# Patient Record
Sex: Female | Born: 1945
Health system: Southern US, Community
[De-identification: ages and names within clinical notes are randomized; demographics above are authoritative.]

## PROBLEM LIST (undated history)

## (undated) DIAGNOSIS — M1990 Unspecified osteoarthritis, unspecified site: Secondary | ICD-10-CM

## (undated) DIAGNOSIS — F32A Depression, unspecified: Secondary | ICD-10-CM

## (undated) DIAGNOSIS — Z95820 Peripheral vascular angioplasty status with implants and grafts: Secondary | ICD-10-CM

## (undated) DIAGNOSIS — K2970 Gastritis, unspecified, without bleeding: Secondary | ICD-10-CM

## (undated) DIAGNOSIS — T7840XA Allergy, unspecified, initial encounter: Secondary | ICD-10-CM

## (undated) DIAGNOSIS — M940 Chondrocostal junction syndrome [Tietze]: Secondary | ICD-10-CM

## (undated) DIAGNOSIS — A0472 Enterocolitis due to Clostridium difficile, not specified as recurrent: Secondary | ICD-10-CM

## (undated) DIAGNOSIS — I10 Essential (primary) hypertension: Secondary | ICD-10-CM

## (undated) DIAGNOSIS — I219 Acute myocardial infarction, unspecified: Secondary | ICD-10-CM

## (undated) DIAGNOSIS — R0600 Dyspnea, unspecified: Secondary | ICD-10-CM

## (undated) DIAGNOSIS — M069 Rheumatoid arthritis, unspecified: Secondary | ICD-10-CM

## (undated) DIAGNOSIS — Z9289 Personal history of other medical treatment: Secondary | ICD-10-CM

## (undated) DIAGNOSIS — G629 Polyneuropathy, unspecified: Secondary | ICD-10-CM

## (undated) DIAGNOSIS — J069 Acute upper respiratory infection, unspecified: Secondary | ICD-10-CM

## (undated) DIAGNOSIS — G709 Myoneural disorder, unspecified: Secondary | ICD-10-CM

## (undated) DIAGNOSIS — K219 Gastro-esophageal reflux disease without esophagitis: Secondary | ICD-10-CM

## (undated) DIAGNOSIS — J449 Chronic obstructive pulmonary disease, unspecified: Secondary | ICD-10-CM

## (undated) DIAGNOSIS — Z8701 Personal history of pneumonia (recurrent): Secondary | ICD-10-CM

## (undated) DIAGNOSIS — N183 Chronic kidney disease, stage 3 (moderate): Secondary | ICD-10-CM

## (undated) DIAGNOSIS — R7303 Prediabetes: Secondary | ICD-10-CM

## (undated) DIAGNOSIS — I2510 Atherosclerotic heart disease of native coronary artery without angina pectoris: Secondary | ICD-10-CM

## (undated) DIAGNOSIS — T783XXA Angioneurotic edema, initial encounter: Secondary | ICD-10-CM

## (undated) DIAGNOSIS — E785 Hyperlipidemia, unspecified: Secondary | ICD-10-CM

## (undated) DIAGNOSIS — I951 Orthostatic hypotension: Secondary | ICD-10-CM

## (undated) DIAGNOSIS — K635 Polyp of colon: Secondary | ICD-10-CM

## (undated) DIAGNOSIS — J189 Pneumonia, unspecified organism: Secondary | ICD-10-CM

## (undated) DIAGNOSIS — F419 Anxiety disorder, unspecified: Secondary | ICD-10-CM

## (undated) DIAGNOSIS — E079 Disorder of thyroid, unspecified: Secondary | ICD-10-CM

## (undated) DIAGNOSIS — E119 Type 2 diabetes mellitus without complications: Secondary | ICD-10-CM

## (undated) DIAGNOSIS — C801 Malignant (primary) neoplasm, unspecified: Secondary | ICD-10-CM

## (undated) HISTORY — PX: OTHER SURGICAL HISTORY: SHX169

## (undated) HISTORY — DX: Acute myocardial infarction, unspecified: I21.9

## (undated) HISTORY — DX: Depression, unspecified: F32.A

## (undated) HISTORY — DX: Polyp of colon: K63.5

## (undated) HISTORY — PX: COLONOSCOPY W/ BIOPSIES AND POLYPECTOMY: SHX1376

## (undated) HISTORY — DX: Gastro-esophageal reflux disease without esophagitis: K21.9

## (undated) HISTORY — DX: Gastritis, unspecified, without bleeding: K29.70

## (undated) HISTORY — DX: Hyperlipidemia, unspecified: E78.5

## (undated) HISTORY — PX: UPPER GASTROINTESTINAL ENDOSCOPY: SHX188

## (undated) HISTORY — DX: Enterocolitis due to Clostridium difficile, not specified as recurrent: A04.72

## (undated) HISTORY — PX: ESOPHAGOGASTRODUODENOSCOPY: SHX1529

## (undated) HISTORY — DX: Essential (primary) hypertension: I10

## (undated) HISTORY — DX: Atherosclerotic heart disease of native coronary artery without angina pectoris: I25.10

## (undated) HISTORY — PX: TOTAL KNEE ARTHROPLASTY: SHX125

## (undated) HISTORY — PX: CARPAL TUNNEL RELEASE: SHX101

## (undated) HISTORY — PX: BACK SURGERY: SHX140

## (undated) HISTORY — DX: Angioneurotic edema, initial encounter: T78.3XXA

## (undated) HISTORY — DX: Personal history of other medical treatment: Z92.89

## (undated) HISTORY — PX: COLONOSCOPY: SHX174

## (undated) HISTORY — DX: Unspecified osteoarthritis, unspecified site: M19.90

## (undated) HISTORY — DX: Allergy, unspecified, initial encounter: T78.40XA

## (undated) HISTORY — DX: Chronic kidney disease, stage 3 (moderate): N18.3

## (undated) HISTORY — DX: Polyneuropathy, unspecified: G62.9

## (undated) HISTORY — DX: Disorder of thyroid, unspecified: E07.9

## (undated) HISTORY — DX: Chondrocostal junction syndrome (tietze): M94.0

## (undated) HISTORY — DX: Type 2 diabetes mellitus without complications: E11.9

## (undated) HISTORY — DX: Personal history of pneumonia (recurrent): Z87.01

## (undated) HISTORY — DX: Acute upper respiratory infection, unspecified: J06.9

---

## 1976-08-02 HISTORY — PX: ABDOMINAL HYSTERECTOMY: SHX81

## 1999-08-12 ENCOUNTER — Emergency Department (HOSPITAL_COMMUNITY): Admission: EM | Admit: 1999-08-12 | Discharge: 1999-08-12 | Payer: Self-pay | Admitting: Emergency Medicine

## 1999-11-07 ENCOUNTER — Emergency Department (HOSPITAL_COMMUNITY): Admission: EM | Admit: 1999-11-07 | Discharge: 1999-11-07 | Payer: Self-pay | Admitting: Emergency Medicine

## 1999-11-08 ENCOUNTER — Encounter: Payer: Self-pay | Admitting: Emergency Medicine

## 2000-05-16 ENCOUNTER — Ambulatory Visit (HOSPITAL_BASED_OUTPATIENT_CLINIC_OR_DEPARTMENT_OTHER): Admission: RE | Admit: 2000-05-16 | Discharge: 2000-05-16 | Payer: Self-pay | Admitting: Orthopedic Surgery

## 2001-05-26 ENCOUNTER — Encounter: Admission: RE | Admit: 2001-05-26 | Discharge: 2001-05-26 | Payer: Self-pay | Admitting: Orthopedic Surgery

## 2001-05-26 ENCOUNTER — Encounter: Payer: Self-pay | Admitting: Orthopedic Surgery

## 2001-06-09 ENCOUNTER — Encounter: Admission: RE | Admit: 2001-06-09 | Discharge: 2001-06-09 | Payer: Self-pay | Admitting: Orthopedic Surgery

## 2001-06-09 ENCOUNTER — Encounter: Payer: Self-pay | Admitting: Orthopedic Surgery

## 2001-06-23 ENCOUNTER — Encounter: Payer: Self-pay | Admitting: Orthopedic Surgery

## 2001-06-23 ENCOUNTER — Encounter: Admission: RE | Admit: 2001-06-23 | Discharge: 2001-06-23 | Payer: Self-pay | Admitting: Orthopedic Surgery

## 2001-07-24 ENCOUNTER — Other Ambulatory Visit: Admission: RE | Admit: 2001-07-24 | Discharge: 2001-07-24 | Payer: Self-pay | Admitting: *Deleted

## 2002-01-15 ENCOUNTER — Encounter: Admission: RE | Admit: 2002-01-15 | Discharge: 2002-04-15 | Payer: Self-pay | Admitting: *Deleted

## 2002-07-31 ENCOUNTER — Other Ambulatory Visit: Admission: RE | Admit: 2002-07-31 | Discharge: 2002-07-31 | Payer: Self-pay | Admitting: Obstetrics & Gynecology

## 2002-11-25 ENCOUNTER — Emergency Department (HOSPITAL_COMMUNITY): Admission: EM | Admit: 2002-11-25 | Discharge: 2002-11-25 | Payer: Self-pay | Admitting: Emergency Medicine

## 2003-06-10 ENCOUNTER — Inpatient Hospital Stay (HOSPITAL_COMMUNITY): Admission: RE | Admit: 2003-06-10 | Discharge: 2003-06-12 | Payer: Self-pay | Admitting: Orthopedic Surgery

## 2003-08-10 ENCOUNTER — Emergency Department (HOSPITAL_COMMUNITY): Admission: AD | Admit: 2003-08-10 | Discharge: 2003-08-10 | Payer: Self-pay | Admitting: Family Medicine

## 2003-08-30 ENCOUNTER — Ambulatory Visit (HOSPITAL_COMMUNITY): Admission: RE | Admit: 2003-08-30 | Discharge: 2003-08-30 | Payer: Self-pay | Admitting: Gastroenterology

## 2003-10-23 ENCOUNTER — Other Ambulatory Visit: Admission: RE | Admit: 2003-10-23 | Discharge: 2003-10-23 | Payer: Self-pay | Admitting: Obstetrics & Gynecology

## 2004-06-03 ENCOUNTER — Emergency Department (HOSPITAL_COMMUNITY): Admission: EM | Admit: 2004-06-03 | Discharge: 2004-06-03 | Payer: Self-pay | Admitting: Emergency Medicine

## 2004-07-15 ENCOUNTER — Ambulatory Visit: Payer: Self-pay | Admitting: Family Medicine

## 2004-07-30 ENCOUNTER — Ambulatory Visit: Payer: Self-pay | Admitting: Family Medicine

## 2004-08-18 ENCOUNTER — Encounter: Admission: RE | Admit: 2004-08-18 | Discharge: 2004-11-16 | Payer: Self-pay | Admitting: Family Medicine

## 2004-08-31 ENCOUNTER — Ambulatory Visit: Payer: Self-pay | Admitting: Family Medicine

## 2004-11-27 ENCOUNTER — Ambulatory Visit: Payer: Self-pay | Admitting: Family Medicine

## 2004-12-19 ENCOUNTER — Encounter: Admission: RE | Admit: 2004-12-19 | Discharge: 2004-12-19 | Payer: Self-pay | Admitting: Neurology

## 2005-01-06 ENCOUNTER — Encounter: Admission: RE | Admit: 2005-01-06 | Discharge: 2005-01-06 | Payer: Self-pay | Admitting: Neurology

## 2005-01-21 ENCOUNTER — Encounter: Admission: RE | Admit: 2005-01-21 | Discharge: 2005-01-21 | Payer: Self-pay | Admitting: Neurology

## 2005-03-25 ENCOUNTER — Ambulatory Visit: Payer: Self-pay | Admitting: Family Medicine

## 2005-04-19 ENCOUNTER — Ambulatory Visit (HOSPITAL_COMMUNITY): Admission: RE | Admit: 2005-04-19 | Discharge: 2005-04-20 | Payer: Self-pay | Admitting: Neurosurgery

## 2005-08-06 ENCOUNTER — Ambulatory Visit: Payer: Self-pay | Admitting: Family Medicine

## 2005-09-17 ENCOUNTER — Ambulatory Visit: Payer: Self-pay | Admitting: Internal Medicine

## 2005-10-05 ENCOUNTER — Ambulatory Visit: Payer: Self-pay | Admitting: Family Medicine

## 2005-10-12 ENCOUNTER — Ambulatory Visit: Payer: Self-pay | Admitting: Family Medicine

## 2005-12-06 ENCOUNTER — Ambulatory Visit: Payer: Self-pay | Admitting: Family Medicine

## 2005-12-13 ENCOUNTER — Ambulatory Visit: Payer: Self-pay | Admitting: Family Medicine

## 2006-01-05 ENCOUNTER — Ambulatory Visit (HOSPITAL_BASED_OUTPATIENT_CLINIC_OR_DEPARTMENT_OTHER): Admission: RE | Admit: 2006-01-05 | Discharge: 2006-01-05 | Payer: Self-pay | Admitting: Orthopedic Surgery

## 2006-02-25 ENCOUNTER — Ambulatory Visit: Payer: Self-pay | Admitting: Family Medicine

## 2006-07-04 ENCOUNTER — Ambulatory Visit: Payer: Self-pay | Admitting: Family Medicine

## 2006-07-07 ENCOUNTER — Ambulatory Visit: Payer: Self-pay | Admitting: Family Medicine

## 2006-08-02 DIAGNOSIS — K635 Polyp of colon: Secondary | ICD-10-CM

## 2006-08-02 HISTORY — DX: Polyp of colon: K63.5

## 2006-08-19 ENCOUNTER — Ambulatory Visit: Payer: Self-pay | Admitting: Family Medicine

## 2006-09-04 ENCOUNTER — Encounter: Admission: RE | Admit: 2006-09-04 | Discharge: 2006-09-04 | Payer: Self-pay | Admitting: Neurosurgery

## 2006-09-27 ENCOUNTER — Ambulatory Visit (HOSPITAL_COMMUNITY): Admission: RE | Admit: 2006-09-27 | Discharge: 2006-09-28 | Payer: Self-pay | Admitting: Neurosurgery

## 2006-11-21 ENCOUNTER — Ambulatory Visit: Payer: Self-pay | Admitting: Family Medicine

## 2006-11-21 LAB — CONVERTED CEMR LAB
ALT: 19 units/L (ref 0–40)
AST: 27 units/L (ref 0–37)
Albumin: 3.8 g/dL (ref 3.5–5.2)
Alkaline Phosphatase: 68 units/L (ref 39–117)
BUN: 19 mg/dL (ref 6–23)
Basophils Absolute: 0.1 10*3/uL (ref 0.0–0.1)
Basophils Relative: 0.9 % (ref 0.0–1.0)
Bilirubin, Direct: 0.1 mg/dL (ref 0.0–0.3)
CO2: 33 meq/L — ABNORMAL HIGH (ref 19–32)
Calcium: 9.8 mg/dL (ref 8.4–10.5)
Chloride: 105 meq/L (ref 96–112)
Cholesterol: 244 mg/dL (ref 0–200)
Creatinine, Ser: 0.8 mg/dL (ref 0.4–1.2)
Direct LDL: 151.5 mg/dL
Eosinophils Absolute: 0.4 10*3/uL (ref 0.0–0.6)
Eosinophils Relative: 6.6 % — ABNORMAL HIGH (ref 0.0–5.0)
GFR calc Af Amer: 94 mL/min
GFR calc non Af Amer: 78 mL/min
Glucose, Bld: 103 mg/dL — ABNORMAL HIGH (ref 70–99)
HCT: 39.6 % (ref 36.0–46.0)
HDL: 60.8 mg/dL (ref >39.0)
Hemoglobin: 13.5 g/dL (ref 12.0–15.0)
Lymphocytes Relative: 53.1 % — ABNORMAL HIGH (ref 12.0–46.0)
MCHC: 34 g/dL (ref 30.0–36.0)
MCV: 90.2 fL (ref 78.0–100.0)
Monocytes Absolute: 0.5 10*3/uL (ref 0.2–0.7)
Monocytes Relative: 8.3 % (ref 3.0–11.0)
Neutro Abs: 1.9 10*3/uL (ref 1.4–7.7)
Neutrophils Relative %: 31.1 % — ABNORMAL LOW (ref 43.0–77.0)
Platelets: 243 10*3/uL (ref 150–400)
Potassium: 3.6 meq/L (ref 3.5–5.1)
RBC: 4.39 M/uL (ref 3.87–5.11)
RDW: 13.9 % (ref 11.5–14.6)
Sodium: 145 meq/L (ref 135–145)
TSH: 2.01 microintl units/mL (ref 0.35–5.50)
Total Bilirubin: 0.7 mg/dL (ref 0.3–1.2)
Total CHOL/HDL Ratio: 4
Total Protein: 7.9 g/dL (ref 6.0–8.3)
Triglycerides: 111 mg/dL (ref 0–149)
VLDL: 22 mg/dL (ref 0–40)
WBC: 6.1 10*3/uL (ref 4.5–10.5)

## 2006-11-28 ENCOUNTER — Ambulatory Visit: Payer: Self-pay | Admitting: Family Medicine

## 2007-01-19 DIAGNOSIS — M1990 Unspecified osteoarthritis, unspecified site: Secondary | ICD-10-CM | POA: Insufficient documentation

## 2007-01-19 DIAGNOSIS — K219 Gastro-esophageal reflux disease without esophagitis: Secondary | ICD-10-CM | POA: Insufficient documentation

## 2007-01-19 DIAGNOSIS — I119 Hypertensive heart disease without heart failure: Secondary | ICD-10-CM | POA: Insufficient documentation

## 2007-01-19 DIAGNOSIS — J309 Allergic rhinitis, unspecified: Secondary | ICD-10-CM | POA: Insufficient documentation

## 2007-01-19 DIAGNOSIS — I10 Essential (primary) hypertension: Secondary | ICD-10-CM | POA: Insufficient documentation

## 2007-01-19 DIAGNOSIS — E039 Hypothyroidism, unspecified: Secondary | ICD-10-CM | POA: Insufficient documentation

## 2007-02-27 ENCOUNTER — Ambulatory Visit: Payer: Self-pay | Admitting: Family Medicine

## 2007-02-27 DIAGNOSIS — N6089 Other benign mammary dysplasias of unspecified breast: Secondary | ICD-10-CM | POA: Insufficient documentation

## 2007-02-27 DIAGNOSIS — M069 Rheumatoid arthritis, unspecified: Secondary | ICD-10-CM | POA: Insufficient documentation

## 2007-05-04 ENCOUNTER — Telehealth (INDEPENDENT_AMBULATORY_CARE_PROVIDER_SITE_OTHER): Payer: Self-pay | Admitting: *Deleted

## 2007-05-07 DIAGNOSIS — M5430 Sciatica, unspecified side: Secondary | ICD-10-CM | POA: Insufficient documentation

## 2007-05-08 ENCOUNTER — Ambulatory Visit: Payer: Self-pay | Admitting: Family Medicine

## 2007-06-08 ENCOUNTER — Ambulatory Visit: Payer: Self-pay | Admitting: Gastroenterology

## 2007-06-13 ENCOUNTER — Ambulatory Visit: Payer: Self-pay | Admitting: Family Medicine

## 2007-06-20 ENCOUNTER — Encounter: Payer: Self-pay | Admitting: Gastroenterology

## 2007-06-20 ENCOUNTER — Encounter: Payer: Self-pay | Admitting: Family Medicine

## 2007-06-20 ENCOUNTER — Ambulatory Visit: Payer: Self-pay | Admitting: Gastroenterology

## 2007-06-20 LAB — HM COLONOSCOPY

## 2007-08-21 ENCOUNTER — Encounter: Admission: RE | Admit: 2007-08-21 | Discharge: 2007-08-21 | Payer: Self-pay | Admitting: Orthopedic Surgery

## 2007-09-02 ENCOUNTER — Ambulatory Visit: Payer: Self-pay | Admitting: Family Medicine

## 2007-09-13 ENCOUNTER — Inpatient Hospital Stay (HOSPITAL_COMMUNITY): Admission: RE | Admit: 2007-09-13 | Discharge: 2007-09-17 | Payer: Self-pay | Admitting: Orthopedic Surgery

## 2007-10-12 ENCOUNTER — Ambulatory Visit: Payer: Self-pay | Admitting: Family Medicine

## 2007-10-12 DIAGNOSIS — K222 Esophageal obstruction: Secondary | ICD-10-CM | POA: Insufficient documentation

## 2007-10-16 ENCOUNTER — Ambulatory Visit: Payer: Self-pay | Admitting: Gastroenterology

## 2007-10-16 LAB — CONVERTED CEMR LAB
Basophils Absolute: 0 10*3/uL (ref 0.0–0.1)
Basophils Relative: 0.5 % (ref 0.0–1.0)
Eosinophils Absolute: 0.1 10*3/uL (ref 0.0–0.6)
Eosinophils Relative: 2 % (ref 0.0–5.0)
HCT: 39.4 % (ref 36.0–46.0)
Hemoglobin: 13.5 g/dL (ref 12.0–15.0)
Lymphocytes Relative: 35.7 % (ref 12.0–46.0)
MCHC: 34.3 g/dL (ref 30.0–36.0)
MCV: 88.8 fL (ref 78.0–100.0)
Monocytes Absolute: 0.5 10*3/uL (ref 0.2–0.7)
Monocytes Relative: 7.6 % (ref 3.0–11.0)
Neutro Abs: 3.8 10*3/uL (ref 1.4–7.7)
Neutrophils Relative %: 54.2 % (ref 43.0–77.0)
Platelets: 225 10*3/uL (ref 150–400)
RBC: 4.43 M/uL (ref 3.87–5.11)
RDW: 14.1 % (ref 11.5–14.6)
WBC: 6.9 10*3/uL (ref 4.5–10.5)

## 2007-10-18 ENCOUNTER — Ambulatory Visit: Payer: Self-pay | Admitting: Gastroenterology

## 2007-10-26 ENCOUNTER — Ambulatory Visit: Payer: Self-pay | Admitting: Gastroenterology

## 2007-10-26 ENCOUNTER — Encounter: Payer: Self-pay | Admitting: Family Medicine

## 2007-10-26 ENCOUNTER — Encounter: Payer: Self-pay | Admitting: Gastroenterology

## 2007-11-13 ENCOUNTER — Ambulatory Visit: Payer: Self-pay | Admitting: Family Medicine

## 2007-11-13 LAB — CONVERTED CEMR LAB
ALT: 15 units/L (ref 0–35)
AST: 20 units/L (ref 0–37)
Albumin: 3.7 g/dL (ref 3.5–5.2)
Alkaline Phosphatase: 68 units/L (ref 39–117)
BUN: 10 mg/dL (ref 6–23)
Basophils Absolute: 0.1 10*3/uL (ref 0.0–0.1)
Basophils Relative: 1 % (ref 0.0–1.0)
Bilirubin Urine: NEGATIVE
Bilirubin, Direct: 0.1 mg/dL (ref 0.0–0.3)
Blood in Urine, dipstick: NEGATIVE
CO2: 35 meq/L — ABNORMAL HIGH (ref 19–32)
Calcium: 10.1 mg/dL (ref 8.4–10.5)
Chloride: 102 meq/L (ref 96–112)
Cholesterol: 202 mg/dL (ref 0–200)
Creatinine, Ser: 0.8 mg/dL (ref 0.4–1.2)
Direct LDL: 128 mg/dL
Eosinophils Absolute: 0.3 10*3/uL (ref 0.0–0.7)
Eosinophils Relative: 4.1 % (ref 0.0–5.0)
GFR calc Af Amer: 93 mL/min
GFR calc non Af Amer: 77 mL/min
Glucose, Bld: 88 mg/dL (ref 70–99)
Glucose, Urine, Semiquant: NEGATIVE
HCT: 39.7 % (ref 36.0–46.0)
HDL: 52.3 mg/dL (ref >39.0)
Hemoglobin: 13 g/dL (ref 12.0–15.0)
Ketones, urine, test strip: NEGATIVE
Lymphocytes Relative: 50.6 % — ABNORMAL HIGH (ref 12.0–46.0)
MCHC: 32.7 g/dL (ref 30.0–36.0)
MCV: 89.6 fL (ref 78.0–100.0)
Monocytes Absolute: 0.5 10*3/uL (ref 0.1–1.0)
Monocytes Relative: 7.5 % (ref 3.0–12.0)
Neutro Abs: 2.4 10*3/uL (ref 1.4–7.7)
Neutrophils Relative %: 36.8 % — ABNORMAL LOW (ref 43.0–77.0)
Nitrite: NEGATIVE
Platelets: 257 10*3/uL (ref 150–400)
Potassium: 3.5 meq/L (ref 3.5–5.1)
RBC: 4.44 M/uL (ref 3.87–5.11)
RDW: 13.5 % (ref 11.5–14.6)
Sodium: 145 meq/L (ref 135–145)
Specific Gravity, Urine: >=1.03
TSH: 2.1 microintl units/mL (ref 0.35–5.50)
Total Bilirubin: 0.6 mg/dL (ref 0.3–1.2)
Total CHOL/HDL Ratio: 3.9
Total Protein: 7.7 g/dL (ref 6.0–8.3)
Triglycerides: 96 mg/dL (ref 0–149)
Urobilinogen, UA: 0.2
VLDL: 19 mg/dL (ref 0–40)
WBC Urine, dipstick: NEGATIVE
WBC: 6.5 10*3/uL (ref 4.5–10.5)
pH: 5.5

## 2007-11-16 ENCOUNTER — Ambulatory Visit: Payer: Self-pay | Admitting: Family Medicine

## 2007-11-16 DIAGNOSIS — E785 Hyperlipidemia, unspecified: Secondary | ICD-10-CM

## 2007-11-16 DIAGNOSIS — E1169 Type 2 diabetes mellitus with other specified complication: Secondary | ICD-10-CM | POA: Insufficient documentation

## 2007-11-23 ENCOUNTER — Ambulatory Visit: Payer: Self-pay | Admitting: Gastroenterology

## 2007-12-04 ENCOUNTER — Ambulatory Visit (HOSPITAL_COMMUNITY): Admission: RE | Admit: 2007-12-04 | Discharge: 2007-12-04 | Payer: Self-pay | Admitting: Gastroenterology

## 2007-12-29 ENCOUNTER — Telehealth: Payer: Self-pay | Admitting: *Deleted

## 2008-01-04 ENCOUNTER — Encounter: Payer: Self-pay | Admitting: Family Medicine

## 2008-03-22 ENCOUNTER — Ambulatory Visit: Payer: Self-pay | Admitting: Family Medicine

## 2008-05-02 ENCOUNTER — Telehealth: Payer: Self-pay | Admitting: Family Medicine

## 2008-05-09 ENCOUNTER — Ambulatory Visit: Payer: Self-pay | Admitting: Family Medicine

## 2008-08-02 HISTORY — PX: NEPHRECTOMY: SHX65

## 2008-09-17 ENCOUNTER — Ambulatory Visit: Payer: Self-pay | Admitting: Family Medicine

## 2008-10-24 ENCOUNTER — Ambulatory Visit: Payer: Self-pay | Admitting: Family Medicine

## 2008-11-14 ENCOUNTER — Ambulatory Visit: Payer: Self-pay | Admitting: Gastroenterology

## 2008-11-14 ENCOUNTER — Ambulatory Visit: Payer: Self-pay | Admitting: Family Medicine

## 2008-11-14 DIAGNOSIS — Z860100 Personal history of colon polyps, unspecified: Secondary | ICD-10-CM | POA: Insufficient documentation

## 2008-11-14 LAB — CONVERTED CEMR LAB
ALT: 18 units/L (ref 0–35)
AST: 27 units/L (ref 0–37)
Albumin: 3.7 g/dL (ref 3.5–5.2)
Alkaline Phosphatase: 61 units/L (ref 39–117)
BUN: 10 mg/dL (ref 6–23)
Basophils Absolute: 0 10*3/uL (ref 0.0–0.1)
Basophils Relative: 0 % (ref 0.0–3.0)
Bilirubin Urine: NEGATIVE
Blood in Urine, dipstick: NEGATIVE
CO2: 30 meq/L (ref 19–32)
Calcium: 9.8 mg/dL (ref 8.4–10.5)
Chloride: 102 meq/L (ref 96–112)
Creatinine, Ser: 0.7 mg/dL (ref 0.4–1.2)
Eosinophils Absolute: 0.3 10*3/uL (ref 0.0–0.7)
Eosinophils Relative: 3.7 % (ref 0.0–5.0)
GFR calc non Af Amer: 108.41 mL/min (ref >60)
Glucose, Bld: 81 mg/dL (ref 70–99)
Glucose, Urine, Semiquant: NEGATIVE
HCT: 38.7 % (ref 36.0–46.0)
Hemoglobin: 13.8 g/dL (ref 12.0–15.0)
Ketones, urine, test strip: NEGATIVE
Lipase: 21 units/L (ref 11.0–59.0)
Lymphocytes Relative: 49.2 % — ABNORMAL HIGH (ref 12.0–46.0)
Lymphs Abs: 3.4 10*3/uL (ref 0.7–4.0)
MCHC: 35.8 g/dL (ref 30.0–36.0)
MCV: 89.9 fL (ref 78.0–100.0)
Monocytes Absolute: 0.9 10*3/uL (ref 0.1–1.0)
Monocytes Relative: 13.5 % — ABNORMAL HIGH (ref 3.0–12.0)
Neutro Abs: 2.3 10*3/uL (ref 1.4–7.7)
Neutrophils Relative %: 33.6 % — ABNORMAL LOW (ref 43.0–77.0)
Nitrite: NEGATIVE
Platelets: 211 10*3/uL (ref 150.0–400.0)
Potassium: 4 meq/L (ref 3.5–5.1)
Protein, U semiquant: NEGATIVE
RBC: 4.3 M/uL (ref 3.87–5.11)
RDW: 13.5 % (ref 11.5–14.6)
Sodium: 140 meq/L (ref 135–145)
Specific Gravity, Urine: <1.005
Total Bilirubin: 0.7 mg/dL (ref 0.3–1.2)
Total Protein: 8.1 g/dL (ref 6.0–8.3)
Urobilinogen, UA: 0.2
WBC Urine, dipstick: NEGATIVE
WBC: 6.9 10*3/uL (ref 4.5–10.5)
pH: 7.5

## 2008-11-15 ENCOUNTER — Ambulatory Visit: Payer: Self-pay | Admitting: Internal Medicine

## 2008-11-18 ENCOUNTER — Ambulatory Visit: Payer: Self-pay | Admitting: Family Medicine

## 2008-11-18 LAB — CONVERTED CEMR LAB
Bilirubin Urine: NEGATIVE
Blood in Urine, dipstick: NEGATIVE
Glucose, Urine, Semiquant: NEGATIVE
Ketones, urine, test strip: NEGATIVE
Nitrite: NEGATIVE
Protein, U semiquant: NEGATIVE
Specific Gravity, Urine: 1.02
Urobilinogen, UA: 0.2
WBC Urine, dipstick: NEGATIVE
pH: 6.5

## 2008-11-19 LAB — CONVERTED CEMR LAB
Cholesterol: 192 mg/dL (ref 0–200)
HDL: 55.3 mg/dL (ref >39.00)
LDL Cholesterol: 122 mg/dL — ABNORMAL HIGH (ref 0–99)
TSH: 0.79 microintl units/mL (ref 0.35–5.50)
Total CHOL/HDL Ratio: 3
Triglycerides: 74 mg/dL (ref 0.0–149.0)
VLDL: 14.8 mg/dL (ref 0.0–40.0)

## 2008-12-10 ENCOUNTER — Ambulatory Visit (HOSPITAL_COMMUNITY): Admission: RE | Admit: 2008-12-10 | Discharge: 2008-12-10 | Payer: Self-pay | Admitting: Urology

## 2008-12-10 ENCOUNTER — Encounter (INDEPENDENT_AMBULATORY_CARE_PROVIDER_SITE_OTHER): Payer: Self-pay | Admitting: Interventional Radiology

## 2009-01-09 ENCOUNTER — Encounter: Payer: Self-pay | Admitting: Family Medicine

## 2009-01-14 DIAGNOSIS — Z905 Acquired absence of kidney: Secondary | ICD-10-CM | POA: Insufficient documentation

## 2009-01-17 ENCOUNTER — Inpatient Hospital Stay (HOSPITAL_COMMUNITY): Admission: RE | Admit: 2009-01-17 | Discharge: 2009-01-20 | Payer: Self-pay | Admitting: Urology

## 2009-01-17 ENCOUNTER — Encounter (INDEPENDENT_AMBULATORY_CARE_PROVIDER_SITE_OTHER): Payer: Self-pay | Admitting: Urology

## 2009-04-10 ENCOUNTER — Ambulatory Visit: Payer: Self-pay | Admitting: Family Medicine

## 2009-06-16 ENCOUNTER — Ambulatory Visit (HOSPITAL_BASED_OUTPATIENT_CLINIC_OR_DEPARTMENT_OTHER): Admission: RE | Admit: 2009-06-16 | Discharge: 2009-06-16 | Payer: Self-pay | Admitting: Orthopedic Surgery

## 2009-08-19 ENCOUNTER — Ambulatory Visit: Payer: Self-pay | Admitting: Family Medicine

## 2009-10-31 LAB — HM DIABETES EYE EXAM: HM Diabetic Eye Exam: NORMAL

## 2009-11-27 ENCOUNTER — Ambulatory Visit: Payer: Self-pay | Admitting: Family Medicine

## 2009-11-27 LAB — CONVERTED CEMR LAB
ALT: 29 units/L (ref 0–35)
AST: 33 units/L (ref 0–37)
Albumin: 3.9 g/dL (ref 3.5–5.2)
Alkaline Phosphatase: 62 units/L (ref 39–117)
BUN: 20 mg/dL (ref 6–23)
Basophils Absolute: 0.1 10*3/uL (ref 0.0–0.1)
Basophils Relative: 0.8 % (ref 0.0–3.0)
Bilirubin, Direct: 0.1 mg/dL (ref 0.0–0.3)
CO2: 29 meq/L (ref 19–32)
Calcium: 9.7 mg/dL (ref 8.4–10.5)
Chloride: 102 meq/L (ref 96–112)
Cholesterol: 194 mg/dL (ref 0–200)
Creatinine, Ser: 1.1 mg/dL (ref 0.4–1.2)
Eosinophils Absolute: 0.4 10*3/uL (ref 0.0–0.7)
Eosinophils Relative: 6.7 % — ABNORMAL HIGH (ref 0.0–5.0)
GFR calc non Af Amer: 63.94 mL/min (ref >60)
Glucose, Bld: 110 mg/dL — ABNORMAL HIGH (ref 70–99)
HCT: 42 % (ref 36.0–46.0)
HDL: 54.8 mg/dL (ref >39.00)
Hemoglobin: 14.4 g/dL (ref 12.0–15.0)
LDL Cholesterol: 108 mg/dL — ABNORMAL HIGH (ref 0–99)
Lymphocytes Relative: 41.6 % (ref 12.0–46.0)
Lymphs Abs: 2.8 10*3/uL (ref 0.7–4.0)
MCHC: 34.2 g/dL (ref 30.0–36.0)
MCV: 88.9 fL (ref 78.0–100.0)
Monocytes Absolute: 0.6 10*3/uL (ref 0.1–1.0)
Monocytes Relative: 8.5 % (ref 3.0–12.0)
Neutro Abs: 2.8 10*3/uL (ref 1.4–7.7)
Neutrophils Relative %: 42.4 % — ABNORMAL LOW (ref 43.0–77.0)
Platelets: 223 10*3/uL (ref 150.0–400.0)
Potassium: 3 meq/L — ABNORMAL LOW (ref 3.5–5.1)
RBC: 4.73 M/uL (ref 3.87–5.11)
RDW: 14.8 % — ABNORMAL HIGH (ref 11.5–14.6)
Sodium: 140 meq/L (ref 135–145)
TSH: 2.37 microintl units/mL (ref 0.35–5.50)
Total Bilirubin: 0.6 mg/dL (ref 0.3–1.2)
Total CHOL/HDL Ratio: 4
Total Protein: 8.4 g/dL — ABNORMAL HIGH (ref 6.0–8.3)
Triglycerides: 154 mg/dL — ABNORMAL HIGH (ref 0.0–149.0)
VLDL: 30.8 mg/dL (ref 0.0–40.0)
WBC: 6.6 10*3/uL (ref 4.5–10.5)

## 2009-12-25 ENCOUNTER — Ambulatory Visit: Payer: Self-pay | Admitting: Family Medicine

## 2009-12-26 ENCOUNTER — Telehealth: Payer: Self-pay | Admitting: Family Medicine

## 2009-12-26 LAB — CONVERTED CEMR LAB
BUN: 17 mg/dL (ref 6–23)
Basophils Absolute: 0 10*3/uL (ref 0.0–0.1)
Basophils Relative: 0.6 % (ref 0.0–3.0)
CO2: 30 meq/L (ref 19–32)
Calcium: 9.7 mg/dL (ref 8.4–10.5)
Chloride: 100 meq/L (ref 96–112)
Creatinine, Ser: 1.2 mg/dL (ref 0.4–1.2)
Eosinophils Absolute: 0.4 10*3/uL (ref 0.0–0.7)
Eosinophils Relative: 5.6 % — ABNORMAL HIGH (ref 0.0–5.0)
GFR calc non Af Amer: 60.73 mL/min (ref >60)
Glucose, Bld: 181 mg/dL — ABNORMAL HIGH (ref 70–99)
HCT: 41.6 % (ref 36.0–46.0)
Hemoglobin: 14.2 g/dL (ref 12.0–15.0)
Lymphocytes Relative: 44.8 % (ref 12.0–46.0)
Lymphs Abs: 2.9 10*3/uL (ref 0.7–4.0)
MCHC: 34.2 g/dL (ref 30.0–36.0)
MCV: 90.4 fL (ref 78.0–100.0)
Monocytes Absolute: 0.5 10*3/uL (ref 0.1–1.0)
Monocytes Relative: 7.4 % (ref 3.0–12.0)
Neutro Abs: 2.7 10*3/uL (ref 1.4–7.7)
Neutrophils Relative %: 41.6 % — ABNORMAL LOW (ref 43.0–77.0)
Platelets: 222 10*3/uL (ref 150.0–400.0)
Potassium: 3.5 meq/L (ref 3.5–5.1)
RBC: 4.6 M/uL (ref 3.87–5.11)
RDW: 14.8 % — ABNORMAL HIGH (ref 11.5–14.6)
Sodium: 140 meq/L (ref 135–145)
Uric Acid, Serum: 7.4 mg/dL — ABNORMAL HIGH (ref 2.4–7.0)
WBC: 6.5 10*3/uL (ref 4.5–10.5)

## 2010-01-22 ENCOUNTER — Ambulatory Visit: Payer: Self-pay | Admitting: Family Medicine

## 2010-01-22 DIAGNOSIS — M109 Gout, unspecified: Secondary | ICD-10-CM | POA: Insufficient documentation

## 2010-02-05 ENCOUNTER — Ambulatory Visit: Payer: Self-pay | Admitting: Family Medicine

## 2010-02-23 ENCOUNTER — Ambulatory Visit (HOSPITAL_COMMUNITY): Admission: RE | Admit: 2010-02-23 | Discharge: 2010-02-23 | Payer: Self-pay | Admitting: Urology

## 2010-03-05 ENCOUNTER — Ambulatory Visit: Payer: Self-pay | Admitting: Family Medicine

## 2010-03-05 DIAGNOSIS — E1129 Type 2 diabetes mellitus with other diabetic kidney complication: Secondary | ICD-10-CM | POA: Insufficient documentation

## 2010-04-27 LAB — HM MAMMOGRAPHY: HM Mammogram: NORMAL

## 2010-04-28 ENCOUNTER — Encounter: Payer: Self-pay | Admitting: Family Medicine

## 2010-06-03 ENCOUNTER — Ambulatory Visit: Payer: Self-pay | Admitting: Family Medicine

## 2010-06-04 LAB — CONVERTED CEMR LAB
BUN: 19 mg/dL (ref 6–23)
CO2: 28 meq/L (ref 19–32)
Calcium: 9.6 mg/dL (ref 8.4–10.5)
Chloride: 104 meq/L (ref 96–112)
Creatinine, Ser: 1 mg/dL (ref 0.4–1.2)
GFR calc non Af Amer: 71.26 mL/min (ref >60)
Glucose, Bld: 97 mg/dL (ref 70–99)
Hgb A1c MFr Bld: 6.3 % (ref 4.6–6.5)
Potassium: 3.1 meq/L — ABNORMAL LOW (ref 3.5–5.1)
Sodium: 141 meq/L (ref 135–145)

## 2010-06-15 ENCOUNTER — Ambulatory Visit: Payer: Self-pay | Admitting: Family Medicine

## 2010-07-21 ENCOUNTER — Ambulatory Visit: Payer: Self-pay | Admitting: Family Medicine

## 2010-09-02 LAB — HM DIABETES EYE EXAM: HM Diabetic Eye Exam: NORMAL

## 2010-09-03 NOTE — Assessment & Plan Note (Signed)
Summary: cpx/mhf   Vital Signs:  Patient Profile:   65 Years Old Female Height:     66 inches (167.64 cm) Weight:      212 pounds Temp:     98.5 degrees F oral Pulse rate:   74 / minute BP sitting:   122 / 80  (left arm)  Vitals Entered By: Doristine Devoid (November 16, 2007 10:41 AM)                 Chief Complaint:  cpx.  History of Present Illness: [name] is a 65 year old female, who comes in today for evaluation of high blood pressure, reflux, esophagitis, allergic rhinitis, osteoarthritis, hypothyroidism, hyperlipidemia, and a new problem, right shoulder pain.  She takes Tenoretic 50 -- 25 one daily for hypertension and her blood pressure has been under good control.  She said no side effects from medication.  she's currently taking protonic 40 mg a day, given to her by Dr. Arlyce Dice.  She said her biopsy was negative.  Her symptoms are starting to abate and she is due to see Dr. Arlyce Dice for follow-up soon.  She takes over-the-counter medication for allergic rhinitis.  She has rheumatoid arthritis.  It just got done seen her rheumatologist however, now she is having severe right shoulder pain.  Her rheumatologist had her on diclofenac 75 mg two tabs b.i.d., but is not helping.  Her hypothyroidism is treated with Synthroid 50 micrograms daily and her TSH level is 2.10 therefore, we will continue that dose. lipids are goal.  We will therefore continue that medication.  In February.  She had a redo of a total knee on the right.  She's doing better nowThe hyperlipidemia, treated with Zocor 20 mg nightly    Current Allergies: ! ASA CODEINE  Past Medical History:    Reviewed history from 10/12/2007 and no changes required:       Allergic rhinitis       GERD       Hypertension       Hypothyroidism       Osteoarthritis       TA       COSTOCHONDRITIS       TKR 09       childbirth x 3       TAH       redo total knee, right, February 2009   Family History:    Reviewed  history from 02/27/2007 and no changes required:       her mother had rheumatoid arthritis.       Family History of Arthritis  Social History:    Reviewed history from 10/12/2007 and no changes required:       Single       Never Smoked       Alcohol use-no       Drug use-no       Regular exercise-yes    Review of Systems      See HPI   Physical Exam  General:     Well-developed,well-nourished,in no acute distress; alert,appropriate and cooperative throughout examination Head:     Normocephalic and atraumatic without obvious abnormalities. No apparent alopecia or balding. Eyes:     No corneal or conjunctival inflammation noted. EOMI. Perrla. Funduscopic exam benign, without hemorrhages, exudates or papilledema. Vision grossly normal. Ears:     External ear exam shows no significant lesions or deformities.  Otoscopic examination reveals clear canals, tympanic membranes are intact bilaterally without bulging, retraction, inflammation or discharge. Hearing is  grossly normal bilaterally. Nose:     External nasal examination shows no deformity or inflammation. Nasal mucosa are pink and moist without lesions or exudates. Mouth:     Oral mucosa and oropharynx without lesions or exudates.  Teeth in good repair. Neck:     No deformities, masses, or tenderness noted. Chest Wall:     No deformities, masses, or tenderness noted. Lungs:     Normal respiratory effort, chest expands symmetrically. Lungs are clear to auscultation, no crackles or wheezes. Heart:     Normal rate and regular rhythm. S1 and S2 normal without gallop, murmur, click, rub or other extra sounds. Abdomen:     Bowel sounds positive,abdomen soft and non-tender without masses, organomegaly or hernias noted. Msk:     No deformity or scoliosis noted of thoracic or lumbar spine.   Pulses:     R and L carotid,radial,femoral,dorsalis pedis and posterior tibial pulses are full and equal bilaterally Extremities:     No  clubbing, cyanosis, edema, or deformity noted with normal full range of motion of all joints.   Neurologic:     No cranial nerve deficits noted. Station and gait are normal. Plantar reflexes are down-going bilaterally. DTRs are symmetrical throughout. Sensory, motor and coordinative functions appear intact. Skin:     well-healed recent scar, right knee from redo total knee replacement February 2009 Cervical Nodes:     No lymphadenopathy noted Axillary Nodes:     No palpable lymphadenopathy Inguinal Nodes:     No significant adenopathy Psych:     Cognition and judgment appear intact. Alert and cooperative with normal attention span and concentration. No apparent delusions, illusions, hallucinations    Impression & Recommendations:  Problem # 1:  HYPOTHYROIDISM (ICD-244.9) Assessment: Improved  Her updated medication list for this problem includes:    Synthroid 50 Mcg Tabs (Levothyroxine sodium) .[name] Kitchen... Take 1 tablet by mouth once a day   Problem # 2:  HYPERTENSION (ICD-401.9) Assessment: Improved  Her updated medication list for this problem includes:    Tenoretic 50 50-25 Mg Tabs (Atenolol-chlorthalidone) .[name] Kitchen... Take one tablet daily   Problem # 3:  ARTHRITIS, RHEUMATOID (ICD-714.0) Assessment: Unchanged  Her updated medication list for this problem includes:    Diclofenac Sodium 75 Mg Tbec (Diclofenac sodium) .[name] Kitchen... 2 tabs bid    Prednisone 20 Mg Tabs (Prednisone) .[name] Kitchen... As needed   Problem # 4:  HYPERLIPIDEMIA (ICD-272.4) Assessment: Unchanged  Her updated medication list for this problem includes:    Zocor 20 Mg Tabs (Simvastatin) .[name] Kitchen... 1 qhs   Problem # 5:  GERD (ICD-530.81) Assessment: Improved  Her updated medication list for this problem includes:    Protonix 40 Mg Tbec (Pantoprazole sodium) .[name] Kitchen... Take 1 tablet by mouth every morning   Complete Medication List: 1)  Klor-con M20 20 Meq Tbcr (Potassium chloride crys cr) .... Take 1 capsule by mouth once a day  2)  Synthroid 50 Mcg Tabs (Levothyroxine sodium) .... Take 1 tablet by mouth once a day 3)  Diclofenac Sodium 75 Mg Tbec (Diclofenac sodium) .... 2 tabs bid 4)  Zocor 20 Mg Tabs (Simvastatin) .[name] Kitchen.. 1 qhs 5)  Tenoretic 50 50-25 Mg Tabs (Atenolol-chlorthalidone) .... Take one tablet daily 6)  Prednisone 20 Mg Tabs (Prednisone) .... As needed 7)  Flexeril 10 Mg Tabs (Cyclobenzaprine hcl) .... One 3xday 8)  Vicodin Es 7.5-750 Mg Tabs (Hydrocodone-acetaminophen) .... One 3x day 9)  Protonix 40 Mg Tbec (Pantoprazole sodium) .... Take 1 tablet by mouth every morning  Patient Instructions: 1)  It is important that you exercise regularly at least 20 minutes 5 times a week. If you develop chest pain, have severe difficulty breathing, or feel very tired , stop exercising immediately and seek medical attention. 2)  You need to lose weight. Consider a lower calorie diet and regular exercise.  3)  Schedule your mammogram. 4)  Take calcium +Vitamin D daily. 5)  Check your Blood Pressure regularly. If it is above: you should make an appointment. 6)  Please schedule a follow-up appointment in 1 year.    Prescriptions: PROTONIX 40 MG  TBEC (PANTOPRAZOLE SODIUM) Take 1 tablet by mouth every morning  #100 x 4   Entered and Authorized by:   Roderick Pee MD   Signed by:   Roderick Pee MD on 11/16/2007   Method used:   Print then Give to Patient   RxID:   [id number] VICODIN ES 7.5-750 MG  TABS (HYDROCODONE-ACETAMINOPHEN) one 3x day  #30 x 1   Entered and Authorized by:   Roderick Pee MD   Signed by:   Roderick Pee MD on 11/16/2007   Method used:   Print then Give to Patient   RxID:   [id number] TENORETIC 50 50-25 MG  TABS (ATENOLOL-CHLORTHALIDONE) take one tablet daily  #100 x 4   Entered and Authorized by:   Roderick Pee MD   Signed by:   Roderick Pee MD on 11/16/2007   Method used:   Electronically sent to ...       [facility]  [address]  [phone]*       [address]       [place], [place]  [address]       Ph: [phone] or [phone]       Fax: [phone]   RxID:   [id number] ZOCOR 20 MG  TABS (SIMVASTATIN) 1 qhs  #100 x 4   Entered and Authorized by:   Roderick Pee MD   Signed by:   Roderick Pee MD on 11/16/2007   Method used:   Electronically sent to ...       [facility]  [address]  [phone]*       [address]       [place], [place]  [address]       Ph: [phone] or [phone]       Fax: [phone]   RxID:   [id number] SYNTHROID 50 MCG  TABS (LEVOTHYROXINE SODIUM) Take 1 tablet by mouth once a day  #100 x 4   Entered and Authorized by:   Roderick Pee MD   Signed by:   Roderick Pee MD on 11/16/2007   Method used:   Electronically sent to ...       [facility]  [address]  [phone]*       [address]       [place], [place]  [address]       Ph: [phone] or [phone]       Fax: [phone]   RxID:   [id number] KLOR-CON M20 20 MEQ  TBCR (POTASSIUM CHLORIDE CRYS CR) Take 1 capsule by mouth once a day  #100 x 4   Entered and Authorized by:   Roderick Pee MD   Signed by:   Roderick Pee MD on 11/16/2007   Method used:  Electronically sent to ...       [facility]  [address]  [phone]*       [address]       [place], [place]  [address]       Ph: [phone] or [phone]       Fax: [phone]   RxID:   [id number]  ]  Appended Document: Orders Update    Clinical Lists Changes  Orders: Added new Service order of EKG w/ Interpretation (93000) - Signed

## 2010-09-03 NOTE — Assessment & Plan Note (Signed)
Summary: coughing at night/stuffy nose/neck pain/ear pain/cjr   Vital Signs:  Patient profile:   65 year old female Weight:      219 pounds Temp:     98.2 degrees F oral BP sitting:   120 / 80  (left arm) Cuff size:   regular  Vitals Entered By: Kern Reap CMA Duncan Dull) (July 21, 2010 2:14 PM) CC: head congestion with neck pain   Primary Care Provider:  Governor Specking, MD  CC:  head congestion with neck pain.  History of Present Illness: Khrystyne is a 65 year old, married female, nonsmoker, who comes in today for evaluation of a viral syndrome x 5 days manifested by a congestion, sore throat, postnasal drip, cough.  She is had a temperature .[name] KitchenMarland KitchenMarland KitchenMarland Kitchen 101.  The first day, but then the fever went away  Allergies: 1)  ! Asa 2)  Codeine  Past History:  Past medical, surgical, family and social histories (including risk factors) reviewed for relevance to current acute and chronic problems.  Past Medical History: Reviewed history from 11/16/2007 and no changes required. Allergic rhinitis GERD Hypertension Hypothyroidism Osteoarthritis TA COSTOCHONDRITIS TKR 09 childbirth x 3 TAH redo total knee, right, February 2009  Past Surgical History: Reviewed history from 08/19/2009 and no changes required. HYSTERECTOMY R TKR HNP Nephrectomy  10.[name] KitchenRCC  Family History: Reviewed history from 01/22/2010 and no changes required. her mother had rheumatoid arthritis. Family History of Arthritis Family History Diabetes 1st degree relative  Social History: Reviewed history from 04/10/2009 and no changes required. Single Never Smoked Alcohol use-no Drug use-no Regular exercise-yes Former Smoker   6./10  Review of Systems      See HPI  Physical Exam  General:  Well-developed,well-nourished,in no acute distress; alert,appropriate and cooperative throughout examination Head:  Normocephalic and atraumatic without obvious abnormalities. No apparent alopecia or balding. Eyes:  No  corneal or conjunctival inflammation noted. EOMI. Perrla. Funduscopic exam benign, without hemorrhages, exudates or papilledema. Vision grossly normal. Ears:  External ear exam shows no significant lesions or deformities.  Otoscopic examination reveals clear canals, tympanic membranes are intact bilaterally without bulging, retraction, inflammation or discharge. Hearing is grossly normal bilaterally. Nose:  External nasal examination shows no deformity or inflammation. Nasal mucosa are pink and moist without lesions or exudates. Mouth:  Oral mucosa and oropharynx without lesions or exudates.  Teeth in good repair. Neck:  No deformities, masses, or tenderness noted. Chest Wall:  No deformities, masses, or tenderness noted. Lungs:  Normal respiratory effort, chest expands symmetrically. Lungs are clear to auscultation, no crackles or wheezes.   Problems:  Medical Problems Added: 1)  Dx of Viral Infection-unspec  (ICD-079.99)  Impression & Recommendations:  Problem # 1:  VIRAL INFECTION-UNSPEC (ICD-079.99) Assessment New  Her updated medication list for this problem includes:    Hydromet 5-1.5 Mg/21ml Syrp (Hydrocodone-homatropine) .[name] Kitchen... 1/2 to 1 tsp three times a day as needed  Complete Medication List: 1)  Klor-con M20 20 Meq Tbcr (Potassium chloride crys cr) .... Take 2 capsule by mouth once a day 2)  Synthroid 50 Mcg Tabs (Levothyroxine sodium) .... Take 1 tablet by mouth once a day 3)  Zocor 20 Mg Tabs (Simvastatin) .[name] Kitchen.. 1 qhs 4)  Tenoretic 50 50-25 Mg Tabs (Atenolol-chlorthalidone) .... Take one tablet daily 5)  Pantoprazole Sodium 40 Mg Tbec (Pantoprazole sodium) .... Take one tab by mouth once daily 6)  Effer-k 20 Meq Tbef (Potassium bicarb-citric acid) .... One tab once daily 7)  Accu-chek Aviva Strp (Glucose blood) .... Use once daily  for glucose control 8)  Lancets Misc (Lancets) .... Use once daily for for glucose control 9)  Metformin Hcl 500 Mg Tabs (Metformin hcl) .... 1/2  qam 10)  Flonase 50 Mcg/act Susp (Fluticasone propionate) .... Uad 11)  Hydromet 5-1.5 Mg/81ml Syrp (Hydrocodone-homatropine) .... 1/2 to 1 tsp three times a day as needed  Patient Instructions: 1)   drink 30 ounces of water daily. 2)  Vaporizer  bedroom at night. 3)  Hydromet one half to 1 teaspoon 3 times a day p.r.n. for cough and cold Prescriptions: HYDROMET 5-1.5 MG/5ML SYRP (HYDROCODONE-HOMATROPINE) 1/2 to 1 tsp three times a day as needed  #8oz x 1   Entered and Authorized by:   Roderick Pee MD   Signed by:   Roderick Pee MD on 07/21/2010   Method used:   Print then Give to Patient   RxID:   [id number]    Orders Added: 1)  Est. Patient Level III [56213]

## 2010-09-03 NOTE — Assessment & Plan Note (Signed)
Summary: dysphagia/dm    Vital Signs:  Patient Profile:   65 Years Old Female Height:     66 inches (167.64 cm) Weight:      201 pounds Temp:     98.2 degrees F oral BP sitting:   118 / 80  (right arm)  Vitals Entered By: Rock Nephew CMA (October 12, 2007 4:21 PM)                 Chief Complaint:  burning feeling with nausea in stomach.  History of Present Illness: [name] is a 65 year old female status post total knee replacement by Dr. Turner Daniels who comes in today for evaluation of abdominal pain and difficulty swallowing.  She said, trouble with her swallowing mechanism in the past.  In 05.  She underwent upper endoscopy and had to be dilated.  She now has symptoms of food getting stuck in her esophagus.  She is also complaining of diffuse epigastric abdominal discomfort.  She said she stopped the OxyContin pain medication about a week ago.  She felt like that was bothering her stomach.  Her bowel movements have been normal with a stool softener.  She's had no urinary tract symptoms.  It's more midepigastric burning, which is constant.  She's never had a history of ulcer disease.  She is off her Coumadin and is not take any aspirin nor anti-inflammatories.    Current Allergies: ! ASA CODEINE  Past Medical History:    Reviewed history from 01/19/2007 and no changes required:       Allergic rhinitis       GERD       Hypertension       Hypothyroidism       Osteoarthritis       TA       COSTOCHONDRITIS       TKR 09       childbirth x 3       TAH   Family History:    Reviewed history from 02/27/2007 and no changes required:       her mother had rheumatoid arthritis.       Family History of Arthritis  Social History:    Reviewed history and no changes required:       Single       Never Smoked       Alcohol use-no       Drug use-no       Regular exercise-yes   Risk Factors:  Tobacco use:  never Drug use:  no Alcohol use:  no Exercise:  yes   Review of  Systems      See HPI   Physical Exam  General:     Well-developed,well-nourished,in no acute distress; alert,appropriate and cooperative throughout examination Head:     Normocephalic and atraumatic without obvious abnormalities. No apparent alopecia or balding. Eyes:     No corneal or conjunctival inflammation noted. EOMI. Perrla. Funduscopic exam benign, without hemorrhages, exudates or papilledema. Vision grossly normal. Ears:     External ear exam shows no significant lesions or deformities.  Otoscopic examination reveals clear canals, tympanic membranes are intact bilaterally without bulging, retraction, inflammation or discharge. Hearing is grossly normal bilaterally. Nose:     External nasal examination shows no deformity or inflammation. Nasal mucosa are pink and moist without lesions or exudates. Mouth:     Oral mucosa and oropharynx without lesions or exudates.  Teeth in good repair. Neck:     No deformities, masses, or tenderness noted.  Abdomen:     Bowel sounds positive,abdomen soft and non-tender without masses, organomegaly or hernias noted.    Impression & Recommendations:  Problem # 1:  ESOPHAGEAL STRICTURE (ICD-530.3) Assessment: Deteriorated  Orders: Gastroenterology Referral (GI)   Problem # 2:  ABDOMINAL PAIN, EPIGASTRIC (ICD-789.06) Assessment: New  Complete Medication List: 1)  Klor-con M20 20 Meq Tbcr (Potassium chloride crys cr) .... Take 1 capsule by mouth once a day 2)  Synthroid 50 Mcg Tabs (Levothyroxine sodium) .... Take 1 tablet by mouth once a day 3)  Diclofenac Sodium 75 Mg Tbec (Diclofenac sodium) .... 2 tabs bid 4)  Zocor 20 Mg Tabs (Simvastatin) .[name] Kitchen.. 1 qhs 5)  Tenoretic 50 50-25 Mg Tabs (Atenolol-chlorthalidone) .... 1/2 qam 6)  Prednisone 20 Mg Tabs (Prednisone) .... As needed 7)  Flexeril 10 Mg Tabs (Cyclobenzaprine hcl) .... One 3xday 8)  Vicodin Es 7.5-750 Mg Tabs (Hydrocodone-acetaminophen) .... One 3x day   Patient Instructions:  1)  do not take any anti-inflammatories, nor aspirin.  Take over-the-counter Prilosec, 20 mg twice a day.  Remember to take it 15 minutes before eating.  do not eat for 3 hours before you go to bed at night and sleep on two pillows.  Also, start a soft diet.  If however, he still feel symptoms in your esophagus, go to clear liquids.  I will send a note to GI and also want you to call them tomorrow to get set up to be seen by Dr. Arlyce Dice, the gastroenterologist as soon as possible for evaluation of the esophageal stricture.    ]

## 2010-09-03 NOTE — Assessment & Plan Note (Signed)
Summary: emp-will fast//ccm   Vital Signs:  Patient profile:   65 year old female Height:      65.5 inches Weight:      228 pounds Temp:     98.1 degrees F oral BP sitting:   110 / 70  (left arm) Cuff size:   regular  Vitals Entered By: Kern Reap CMA Duncan Dull) (November 27, 2009 9:40 AM)  Reason for Visit cpx   Primary Care Provider:  Governor Specking, MD   History of Present Illness: [name] is a 65 year old female, nonsmoker, who comes in today for physical evaluation of hypothyroidism, hyperlipidemia, hypertension.  Her hypothyroidism is treated with Synthroid 50 micrograms daily.  Will check TSH level today.  Her hyperlipidemia, history of his Zocor 20 mg nightly Will check lipid panel today.  Her hypertension is treated with Tenoretic 50 -- 25 daily.  BP 110/70.  Last year.  She had a workup for abdominal pain.  She was found to have a right renal cell carcinoma.  She had a kidney removed by Dr. Veverly Fells and has done well.  She's also had a total right knee replacement in two years ago had arthroscopic surgery to her left knee.  She is now unable to walk because of pain ever left knee.  She is to go back to see the orthopedist for further evaluation.  The right knee is working well.  Her past medical history, social history, family history in detail.  No significant changes.  She continues to remain physically active as much as possible because of the pain and swelling of her left knee.  Her mood is good, hearing normal, ADLs, normal, fall risk.  Negligible.  A home safety reviewed negative.  Height, weight, visual acuity normal.  Tetanus 2003, seasonal flu 2011, Pneumovax 2008.  Information given on shingles.  Allergies: 1)  ! Asa 2)  Codeine  Past History:  Past medical, surgical, family and social histories (including risk factors) reviewed, and no changes noted (except as noted below).  Past Medical History: Reviewed history from 11/16/2007 and no changes  required. Allergic rhinitis GERD Hypertension Hypothyroidism Osteoarthritis TA COSTOCHONDRITIS TKR 09 childbirth x 3 TAH redo total knee, right, February 2009  Past Surgical History: Reviewed history from 08/19/2009 and no changes required. HYSTERECTOMY R TKR HNP Nephrectomy  10.[name]  Family History: Reviewed history from 02/27/2007 and no changes required. her mother had rheumatoid arthritis. Family History of Arthritis  Social History: Reviewed history from 04/10/2009 and no changes required. Single Never Smoked Alcohol use-no Drug use-no Regular exercise-yes Former Smoker   6./10  Review of Systems      See HPI  Physical Exam  General:  Well-developed,well-nourished,in no acute distress; alert,appropriate and cooperative throughout examination Head:  Normocephalic and atraumatic without obvious abnormalities. No apparent alopecia or balding. Eyes:  No corneal or conjunctival inflammation noted. EOMI. Perrla. Funduscopic exam benign, without hemorrhages, exudates or papilledema. Vision grossly normal. Ears:  External ear exam shows no significant lesions or deformities.  Otoscopic examination reveals clear canals, tympanic membranes are intact bilaterally without bulging, retraction, inflammation or discharge. Hearing is grossly normal bilaterally. Nose:  External nasal examination shows no deformity or inflammation. Nasal mucosa are pink and moist without lesions or exudates. Mouth:  Oral mucosa and oropharynx without lesions or exudates.  Teeth in good repair. Neck:  No deformities, masses, or tenderness noted. Chest Wall:  No deformities, masses, or tenderness noted. Breasts:  No mass, nodules, thickening, tenderness, bulging, retraction, inflamation, nipple discharge or  skin changes noted.   Lungs:  Normal respiratory effort, chest expands symmetrically. Lungs are clear to auscultation, no crackles or wheezes. Heart:  Normal rate and regular rhythm. S1 and S2  normal without gallop, murmur, click, rub or other extra sounds. Abdomen:  Bowel sounds positive,abdomen soft and non-tender without masses, organomegaly or hernias noted. Msk:  anterior scar, right knee from previous total knee replacement.  Swelling and deformity of left knee Pulses:  R and L carotid,radial,femoral,dorsalis pedis and posterior tibial pulses are full and equal bilaterally Extremities:  No clubbing, cyanosis, edema, or deformity noted with normal full range of motion of all joints.   Neurologic:  No cranial nerve deficits noted. Station and gait are normal. Plantar reflexes are down-going bilaterally. DTRs are symmetrical throughout. Sensory, motor and coordinative functions appear intact. Skin:  Intact without suspicious lesions or rashes Cervical Nodes:  No lymphadenopathy noted Axillary Nodes:  No palpable lymphadenopathy Inguinal Nodes:  No significant adenopathy Psych:  Cognition and judgment appear intact. Alert and cooperative with normal attention span and concentration. No apparent delusions, illusions, hallucinations   Impression & Recommendations:  Problem # 1:  HYPERLIPIDEMIA (ICD-272.4) Assessment Improved  Her updated medication list for this problem includes:    Zocor 20 Mg Tabs (Simvastatin) .[name] Kitchen... 1 qhs  Orders: Venipuncture (16109) TLB-BMP (Basic Metabolic Panel-BMET) (80048-METABOL) TLB-CBC Platelet - w/Differential (85025-CBCD) TLB-Hepatic/Liver Function Pnl (80076-HEPATIC) TLB-TSH (Thyroid Stimulating Hormone) (84443-TSH) TLB-Lipid Panel (80061-LIPID) Prescription Created Electronically [phone]) UA Dipstick w/o Micro (automated)  (81003) EKG w/ Interpretation (93000)  Problem # 2:  HYPOTHYROIDISM (ICD-244.9) Assessment: Improved  Her updated medication list for this problem includes:    Synthroid 50 Mcg Tabs (Levothyroxine sodium) .[name] Kitchen... Take 1 tablet by mouth once a day  Orders: Venipuncture (09811) TLB-BMP (Basic Metabolic Panel-BMET)  (80048-METABOL) TLB-CBC Platelet - w/Differential (85025-CBCD) TLB-Hepatic/Liver Function Pnl (80076-HEPATIC) TLB-TSH (Thyroid Stimulating Hormone) (84443-TSH) TLB-Lipid Panel (80061-LIPID) Prescription Created Electronically [phone]) UA Dipstick w/o Micro (automated)  (81003)  Problem # 3:  HYPERTENSION (ICD-401.9) Assessment: Improved  Her updated medication list for this problem includes:    Tenoretic 50 50-25 Mg Tabs (Atenolol-chlorthalidone) .[name] Kitchen... Take one tablet daily  Orders: Venipuncture (29562) TLB-BMP (Basic Metabolic Panel-BMET) (80048-METABOL) TLB-CBC Platelet - w/Differential (85025-CBCD) TLB-Hepatic/Liver Function Pnl (80076-HEPATIC) TLB-TSH (Thyroid Stimulating Hormone) (84443-TSH) TLB-Lipid Panel (80061-LIPID) Prescription Created Electronically [phone]) UA Dipstick w/o Micro (automated)  (81003)  Problem # 4:  Preventive Health Care (ICD-V70.0) Assessment: Unchanged  Orders: Venipuncture (57846) TLB-BMP (Basic Metabolic Panel-BMET) (80048-METABOL) TLB-CBC Platelet - w/Differential (85025-CBCD) TLB-Hepatic/Liver Function Pnl (80076-HEPATIC) TLB-TSH (Thyroid Stimulating Hormone) (84443-TSH) TLB-Lipid Panel (80061-LIPID) Prescription Created Electronically ([id number]) UA Dipstick w/o Micro (automated)  (81003) EKG w/ Interpretation (93000)  Complete Medication List: 1)  Klor-con M20 20 Meq Tbcr (Potassium chloride crys cr) .... Take 1 capsule by mouth once a day 2)  Synthroid 50 Mcg Tabs (Levothyroxine sodium) .... Take 1 tablet by mouth once a day 3)  Zocor 20 Mg Tabs (Simvastatin) .[name] Kitchen.. 1 qhs 4)  Tenoretic 50 50-25 Mg Tabs (Atenolol-chlorthalidone) .... Take one tablet daily 5)  Flexeril 10 Mg Tabs (Cyclobenzaprine hcl) .... Take 1 tab three times a day as needed 6)  Prednisone 20 Mg Tabs (Prednisone) .... Uad 7)  Ultram 50 Mg Tabs (Tramadol hcl) .... Take 1 tab every 6-8 hours for pain 8)  Mobic 7.5 Mg Tabs (Meloxicam) .... Take one tab once daily 9)   Pantoprazole Sodium 40 Mg Tbec (Pantoprazole sodium) .... Take one tab by mouth once daily  Patient Instructions: 1)  continue your current medications.  We will call you when we get her lab work back 2)  Please schedule a follow-up appointment in 1 year. Prescriptions: PANTOPRAZOLE SODIUM 40 MG TBEC (PANTOPRAZOLE SODIUM) take one tab by mouth once daily  #100 x 3   Entered and Authorized by:   Roderick Pee MD   Signed by:   Roderick Pee MD on 11/27/2009   Method used:   Electronically to        [facility] #[id number]* (retail)       [address]       [place], [place]  [address]       Ph: [phone]       Fax: [phone]   RxID:   [id number] TENORETIC 50 50-25 MG  TABS (ATENOLOL-CHLORTHALIDONE) take one tablet daily  #100 x 3   Entered and Authorized by:   Roderick Pee MD   Signed by:   Roderick Pee MD on 11/27/2009   Method used:   Electronically to        [facility] #[id number]* (retail)       [address]       [place], [place]  [address]       Ph: [phone]       Fax: [phone]   RxID:   [id number] ZOCOR 20 MG  TABS (SIMVASTATIN) 1 qhs  #100 x 3   Entered and Authorized by:   Roderick Pee MD   Signed by:   Roderick Pee MD on 11/27/2009   Method used:   Electronically to        [facility] #[id number]* (retail)       [address]       [place], [place]  [address]       Ph: [phone]       Fax: [phone]   RxID:   [id number] SYNTHROID 50 MCG  TABS (LEVOTHYROXINE SODIUM) Take 1 tablet by mouth once a day  #100 x 4   Entered and Authorized by:   Roderick Pee MD   Signed by:   Roderick Pee MD on 11/27/2009   Method used:   Electronically to        [facility] #[id number]* (retail)       [address]       [place], [place]  [address]       Ph: [phone]       Fax:  [phone]   RxID:   [id number] KLOR-CON M20 20 MEQ  TBCR (POTASSIUM CHLORIDE CRYS CR) Take 1 capsule by mouth once a day  #100 x 4   Entered and Authorized by:   Roderick Pee MD   Signed by:   Roderick Pee MD on 11/27/2009   Method used:   Electronically to        [facility] #[id number]* (retail)       [address]       [place], [place]  [address]       Ph: [phone]       Fax: [phone]   RxID:   [id number]

## 2010-09-03 NOTE — Assessment & Plan Note (Signed)
Summary: 1 MONTH ROV/NJR   Vital Signs:  Patient profile:   65 year old female Weight:      226 pounds Temp:     98.8 degrees F oral BP sitting:   120 / 78  (left arm)  Vitals Entered By: Kern Reap CMA Duncan Dull) (March 05, 2010 10:13 AM)  CC: follow-up visit Is Patient Diabetic? Yes Pain Assessment Patient in pain? yes     Location: shoulder   Primary Care Provider:  Governor Specking, MD  CC:  follow-up visit.  History of Present Illness: [name] is a 65 year old female, who comes in today for follow-up of diabetes.  She is on metformin 500 mg dose one half tab q.a.m. blood sugars dropped to the 110, 120 range.  Asymptomatic.  No hypoglycemia.  She recently had a follow-up workup because of the renal cell carcinoma.  They told her she is cancer free.  She is to see her rheumatologist in the fall.  She does have underlying rheumatoid arthritis.  The last medication was methotrexate, which she had to stop taking because she had side effects.  She's currently taking Motrin 400 mg twice daily.  Discussed increasing it slightly to 4 mg 3 times a day with food.  Allergies: 1)  ! Asa 2)  Codeine  Review of Systems      See HPI  Physical Exam  General:  Well-developed,well-nourished,in no acute distress; alert,appropriate and cooperative throughout examination   Problems:  Medical Problems Added: 1)  Dx of Diabetes Mellitus  (ICD-250.00)  Impression & Recommendations:  Problem # 1:  DIABETES MELLITUS (ICD-250.00) Assessment Improved  Her updated medication list for this problem includes:    Metformin Hcl 500 Mg Tabs (Metformin hcl) .[name] Kitchen... 1/2 qam  Complete Medication List: 1)  Klor-con M20 20 Meq Tbcr (Potassium chloride crys cr) .... Take 1 capsule by mouth once a day 2)  Synthroid 50 Mcg Tabs (Levothyroxine sodium) .... Take 1 tablet by mouth once a day 3)  Zocor 20 Mg Tabs (Simvastatin) .[name] Kitchen.. 1 qhs 4)  Tenoretic 50 50-25 Mg Tabs (Atenolol-chlorthalidone) .... Take  one tablet daily 5)  Pantoprazole Sodium 40 Mg Tbec (Pantoprazole sodium) .... Take one tab by mouth once daily 6)  Effer-k 20 Meq Tbef (Potassium bicarb-citric acid) .... One tab once daily 7)  Allopurinol 300 Mg Tabs (Allopurinol) .... Take one tab by mouth once daily 8)  Accu-chek Aviva Strp (Glucose blood) .... Use once daily for glucose control 9)  Lancets Misc (Lancets) .... Use once daily for for glucose control 10)  Metformin Hcl 500 Mg Tabs (Metformin hcl) .... 1/2 qam  Patient Instructions: 1)  continue the half a tablet of metformin every morning before breakfast.  Check a fasting blood sugar Monday, Wednesday, Friday. 2)  Please schedule a follow-up appointment in 3 months. 3)  BMP prior to visit, ICD-9: 4)  HbgA1C prior to visit, ICD-9:..........................increase the Motrin to 400 mg....... 3 times a day with food.  If your symptoms get worse call to see your rheumatologist, sooner

## 2010-09-03 NOTE — Procedures (Signed)
Summary: colonoscopy  colonoscopy   Imported By: Kassie Mends 07/10/2007 15:30:25  _____________________________________________________________________  External Attachment:    Type:   Image     Comment:   colonoscopy

## 2010-09-03 NOTE — Progress Notes (Signed)
Summary: REFERRAL   Phone Note Call from Patient Call back at Home Phone [phone]   Caller: Patient Call For: DR TODD Reason for Call: Referral Summary of Call: PT WAS SEEN ABOUT 1 MONTH AGO. A COLONOSCOPY WAS GOING TO BE SET UP. SHE NEVER HEARD BACK. WOULD LIKE A REFERRAL FOR A COLONOSCOPY. Initial call taken by: Warnell Forester,  May 04, 2007 2:48 PM  Follow-up for Phone Call        please set up colonoscopy Follow-up by: Roderick Pee MD,  May 05, 2007 8:00 AM  Additional Follow-up for Phone Call Additional follow up Details #1::        Order faxed/office will contact patient Additional Follow-up by: Florentina Addison,  May 08, 2007 9:07 AM         Appended Document: REFERRAL 11/04 @ 1:00 previsit 11/18 @ 9:30 Colon with Arlyce Dice

## 2010-09-03 NOTE — Assessment & Plan Note (Signed)
Summary: 2 wk rov/njr   Vital Signs:  Patient profile:   65 year old female Weight:      230 pounds Temp:     98.2 degrees F oral BP sitting:   124 / 84  (left arm) Cuff size:   regular  Vitals Entered By: Kern Reap CMA Duncan Dull) (February 05, 2010 11:57 AM)    Primary Care Provider:  Governor Specking, MD   History of Present Illness: [name] is a 65 year old female, who comes back today for evaluation of elevated blood sugar.  She's worked on her diet and she is walking daily however, her blood sugars are still averaging 130 to 140.  Allergies: 1)  ! Asa 2)  Codeine  Review of Systems      See HPI  Physical Exam  General:  Well-developed,well-nourished,in no acute distress; alert,appropriate and cooperative throughout examination  Diabetes Management Exam:    Eye Exam:       Eye Exam done elsewhere          Date: 10/31/2009          Results: normal          Done by: Ginette Otto ortho   Impression & Recommendations:  Problem # 1:  HYPERGLYCEMIA (ICD-790.29) Assessment Unchanged  Her updated medication list for this problem includes:    Metformin Hcl 500 Mg Tabs (Metformin hcl) .[name] Kitchen... 1/2 qam  Orders: Prescription Created Electronically [phone])  Complete Medication List: 1)  Klor-con M20 20 Meq Tbcr (Potassium chloride crys cr) .... Take 1 capsule by mouth once a day 2)  Synthroid 50 Mcg Tabs (Levothyroxine sodium) .... Take 1 tablet by mouth once a day 3)  Zocor 20 Mg Tabs (Simvastatin) .[name] Kitchen.. 1 qhs 4)  Tenoretic 50 50-25 Mg Tabs (Atenolol-chlorthalidone) .... Take one tablet daily 5)  Pantoprazole Sodium 40 Mg Tbec (Pantoprazole sodium) .... Take one tab by mouth once daily 6)  Effer-k 20 Meq Tbef (Potassium bicarb-citric acid) .... One tab once daily 7)  Allopurinol 300 Mg Tabs (Allopurinol) .... Take one tab by mouth once daily 8)  Accu-chek Aviva Strp (Glucose blood) .... Use once daily for glucose control 9)  Lancets Misc (Lancets) .... Use once daily for for  glucose control 10)  Metformin Hcl 500 Mg Tabs (Metformin hcl) .... 1/2 qam  Patient Instructions: 1)  check a fasting blood sugar daily in the morning and take one half of a 500-mg metformin tablets prior to breakfast return  The first week in August for follow up Prescriptions: METFORMIN HCL 500 MG TABS (METFORMIN HCL) 1/2 qam  #50 x 3   Entered and Authorized by:   Roderick Pee MD   Signed by:   Roderick Pee MD on 02/05/2010   Method used:   Electronically to        [facility] #[id number]* (retail)       [address]       [place], [place]  [address]       Ph: [phone]       Fax: [phone]   RxID:   [id number]

## 2010-09-03 NOTE — Assessment & Plan Note (Signed)
Summary: sinuses//ccm   Vital Signs:  Patient profile:   65 year old female Weight:      220 pounds Temp:     98.3 degrees F oral BP sitting:   120 / 80  (left arm) Cuff size:   regular  Vitals Entered By: Kern Reap CMA Duncan Dull) (April 10, 2009 12:40 PM)  Reason for Visit sinus pressure  Primary Care Provider:  Governor Specking, MD   History of Present Illness: [name] is a 65 y/o fem. w 7 days h/o of postnasal drip, head congestion, cough, no wheezing.  She had a history of allergic rhinitis in the past.  No asthma.  We saw her last in June for evaluation of abdominal pain.  We sent her to GI GI workup negative.  However, the CT scan showed a renal mass.  Renal mass turned out to be a cancer, which was removed by Dr. Georgianne Fick in June.  The cancer was confined to the kidney.  No follow-up treatment as needed.  Also at that juncture.  She quit smoking  Preventive Screening-Counseling & Management  Alcohol-Tobacco     Smoking Status: quit  Allergies: 1)  ! Asa 2)  Codeine  Past History:  Past medical, surgical, family and social histories (including risk factors) reviewed for relevance to current acute and chronic problems.  Past Medical History: Reviewed history from 11/16/2007 and no changes required. Allergic rhinitis GERD Hypertension Hypothyroidism Osteoarthritis TA COSTOCHONDRITIS TKR 09 childbirth x 3 TAH redo total knee, right, February 2009  Past Surgical History: Reviewed history from 11/14/2008 and no changes required. HYSTERECTOMY R TKR HNP  Family History: Reviewed history from 02/27/2007 and no changes required. her mother had rheumatoid arthritis. Family History of Arthritis  Social History: Reviewed history from 10/12/2007 and no changes required. Single Never Smoked Alcohol use-no Drug use-no Regular exercise-yes Former Smoker   6./10 Smoking Status:  quit  Review of Systems      See HPI  Physical Exam  General:   Well-developed,well-nourished,in no acute distress; alert,appropriate and cooperative throughout examination Head:  Normocephalic and atraumatic without obvious abnormalities. No apparent alopecia or balding. Eyes:  No corneal or conjunctival inflammation noted. EOMI. Perrla. Funduscopic exam benign, without hemorrhages, exudates or papilledema. Vision grossly normal. Ears:  External ear exam shows no significant lesions or deformities.  Otoscopic examination reveals clear canals, tympanic membranes are intact bilaterally without bulging, retraction, inflammation or discharge. Hearing is grossly normal bilaterally. Nose:  External nasal examination shows no deformity or inflammation. Nasal mucosa are pink and moist without lesions or exudates. Mouth:  Oral mucosa and oropharynx without lesions or exudates.  Teeth in good repair. Neck:  No deformities, masses, or tenderness noted. Chest Wall:  No deformities, masses, or tenderness noted. Lungs:  Normal respiratory effort, chest expands symmetrically. Lungs are clear to auscultation, no crackles or wheezes.   Impression & Recommendations:  Problem # 1:  RENAL CELL CANCER (ICD-189.0) Assessment Improved  Orders: Prescription Created Electronically [phone])  Problem # 2:  ALLERGIC RHINITIS (ICD-477.9) Assessment: Deteriorated  Orders: Prescription Created Electronically [phone])  Complete Medication List: 1)  Klor-con M20 20 Meq Tbcr (Potassium chloride crys cr) .... Take 1 capsule by mouth once a day 2)  Synthroid 50 Mcg Tabs (Levothyroxine sodium) .... Take 1 tablet by mouth once a day 3)  Zocor 20 Mg Tabs (Simvastatin) .[name] Kitchen.. 1 qhs 4)  Tenoretic 50 50-25 Mg Tabs (Atenolol-chlorthalidone) .... Take one tablet daily 5)  Flexeril 10 Mg Tabs (Cyclobenzaprine hcl) .[name] KitchenMarland KitchenMarland Kitchen  Take 1 tab three times a day as needed 6)  Prednisone 20 Mg Tabs (Prednisone) .... Uad 7)  Vicodin Es 7.5-750 Mg Tabs (Hydrocodone-acetaminophen) .[name] Kitchen.. 1 tab @ bedtime 8)  Ultram  50 Mg Tabs (Tramadol hcl) .... Take 1 tab every 6-8 hours for pain 9)  Vitamin D (ergocalciferol) 50000 Unit Caps (Ergocalciferol) .... Take one tab every week  Patient Instructions: 1)  begin 10 mg of plain Zyrtec nightly if your symptoms don't improve.  He may take a short course of prednisone one tablet daily x 3 days, a half a tablet daily, x 3 days, then half a tablet Monday, Wednesday, Friday, for a two week taper.  Return when due for your annual exam

## 2010-09-03 NOTE — Assessment & Plan Note (Signed)
Summary: PT WILL COME IN FASTING/NJR   Vital Signs:  Patient profile:   65 year old female Height:      66 inches Weight:      211 pounds Temp:     98.2 degrees F oral BP sitting:   110 / 80  (left arm) Cuff size:   regular  Vitals Entered By: Kern Reap CMA (November 18, 2008 10:40 AM)  History of Present Illness: [name] is a 65 year old female, who comes in today for general physical examination because of underlying hypothyroidism, hyperlipidemia, and hypertension.  The hypothyroidism is treated with Synthroid 50 micrograms daily with check TSH level today.  The hyperlipidemia.  History was so poor, 20 mg nightly the side effects.  Will check lipid panel today.  Hypertension history with Tenoretic 50 -- 25 q.a.m., BP 110/80.  Asymptomatic.  No side effects from medication.  She also had a evaluation by [place] and was referred to GI.  Last Friday for evaluation of abdominal pain.  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  Patient will be referred to urology for further evaluation.  However, I do not think this explains her abdominal pain.  Will refer back to GI for that problem.  Her last tetanus booster was 2003, pneumonia shot 2008.  She gets routine eye care.  Dental care. [facility]  monthly and annual mammography.  Allergies: 1)  ! Asa 2)  Codeine  Past History:  Past medical, surgical, family and social histories (including risk factors) reviewed, and no changes noted (except as noted below).  Past Medical History:    Reviewed history from 11/16/2007 and no changes required:    Allergic rhinitis    GERD    Hypertension    Hypothyroidism    Osteoarthritis    TA    COSTOCHONDRITIS    TKR 09    childbirth x 3    TAH    redo total knee, right, February 2009  Past Surgical History:    Reviewed history from 01/19/2007 and no changes required:    TAH    R TKR    HNP  Family History:    Reviewed history from 02/27/2007 and no  changes required:       her mother had rheumatoid arthritis.       Family History of Arthritis  Social History:    Reviewed history from 10/12/2007 and no changes required:       Single       Never Smoked       Alcohol use-no       Drug use-no       Regular exercise-yes  Review of Systems      See HPI  Physical Exam  General:  Well-developed,well-nourished,in no acute distress; alert,appropriate and cooperative throughout examination Head:  Normocephalic and atraumatic without obvious abnormalities. No apparent alopecia or balding. Eyes:  No corneal or conjunctival inflammation noted. EOMI. Perrla. Funduscopic exam benign, without hemorrhages, exudates or papilledema. Vision grossly normal. Ears:  External ear exam shows no significant lesions or deformities.  Otoscopic examination reveals clear canals, tympanic membranes are intact bilaterally without bulging, retraction, inflammation or discharge. Hearing is grossly normal bilaterally. Nose:  External nasal examination shows no deformity or inflammation. Nasal mucosa are pink and moist without lesions or exudates. Mouth:  Oral mucosa and oropharynx without lesions or exudates.  Teeth in good repair. Neck:  No deformities, masses, or tenderness noted. Chest Wall:  No  deformities, masses, or tenderness noted. Breasts:  No mass, nodules, thickening, tenderness, bulging, retraction, inflamation, nipple discharge or skin changes noted.   Lungs:  Normal respiratory effort, chest expands symmetrically. Lungs are clear to auscultation, no crackles or wheezes. Heart:  Normal rate and regular rhythm. S1 and S2 normal without gallop, murmur, click, rub or other extra sounds. Abdomen:  Bowel sounds positive,abdomen soft and non-tender without masses, organomegaly or hernias noted. Msk:  No deformity or scoliosis noted of thoracic or lumbar spine.   Pulses:  R and L carotid,radial,femoral,dorsalis pedis and posterior tibial pulses are full and  equal bilaterally Extremities:  No clubbing, cyanosis, edema, or deformity noted with normal full range of motion of all joints.   Neurologic:  No cranial nerve deficits noted. Station and gait are normal. Plantar reflexes are down-going bilaterally. DTRs are symmetrical throughout. Sensory, motor and coordinative functions appear intact. Skin:  Intact without suspicious lesions or rashes Cervical Nodes:  No lymphadenopathy noted Axillary Nodes:  No palpable lymphadenopathy Inguinal Nodes:  No significant adenopathy Psych:  Cognition and judgment appear intact. Alert and cooperative with normal attention span and concentration. No apparent delusions, illusions, hallucinations   Impression & Recommendations:  Problem # 1:  HYPERLIPIDEMIA (ICD-272.4) Assessment Improved  Her updated medication list for this problem includes:    Zocor 20 Mg Tabs (Simvastatin) .[name] Kitchen... 1 qhs  Orders: EKG w/ Interpretation (93000)  Problem # 2:  HYPOTHYROIDISM (ICD-244.9) Assessment: Improved  Her updated medication list for this problem includes:    Synthroid 50 Mcg Tabs (Levothyroxine sodium) .[name] Kitchen... Take 1 tablet by mouth once a day  Orders: Prescription Created Electronically [phone]) Venipuncture (51761) TLB-Lipid Panel (80061-LIPID) TLB-TSH (Thyroid Stimulating Hormone) (84443-TSH)  Problem # 3:  HYPERTENSION (ICD-401.9) Assessment: Improved  Her updated medication list for this problem includes:    Tenoretic 50 50-25 Mg Tabs (Atenolol-chlorthalidone) .[name] Kitchen... Take one tablet daily  Orders: Prescription Created Electronically [phone]) Venipuncture [phone]) TLB-Lipid Panel (80061-LIPID) TLB-TSH (Thyroid Stimulating Hormone) (84443-TSH) UA Dipstick w/o Micro (automated)  (81003) EKG w/ Interpretation (93000)  Complete Medication List: 1)  Klor-con M20 20 Meq Tbcr (Potassium chloride crys cr) .... Take 1 capsule by mouth once a day 2)  Synthroid 50 Mcg Tabs (Levothyroxine sodium) .... Take 1  tablet by mouth once a day 3)  Zocor 20 Mg Tabs (Simvastatin) .[name] Kitchen.. 1 qhs 4)  Tenoretic 50 50-25 Mg Tabs (Atenolol-chlorthalidone) .... Take one tablet daily 5)  Flexeril 10 Mg Tabs (Cyclobenzaprine hcl) .... Take 1 tab three times a day as needed 6)  Prednisone 20 Mg Tabs (Prednisone) .... Uad 7)  Vicodin Es 7.5-750 Mg Tabs (Hydrocodone-acetaminophen) .[name] Kitchen.. 1 tab @ bedtime 8)  Ultram 50 Mg Tabs (Tramadol hcl) .... Take 1 tab every 6-8 hours for pain  Patient Instructions: 1)  call the urology Center to make an appointment to be seen for evaluation of the abnormality that is seen on the CT scan. 2)  Also, call, Dr. Marzetta Board office for reevaluation of abdominal pain. 3)  Continue current medications.  I will call you and we get your laboratory work back Prescriptions: TENORETIC 50 50-25 MG  TABS (ATENOLOL-CHLORTHALIDONE) take one tablet daily  #100 x 4   Entered and Authorized by:   Roderick Pee MD   Signed by:   Roderick Pee MD on 11/18/2008   Method used:   Electronically to        [facility] #[id number]* (retail)       [address]       [place], [place]  [address]       Ph: [phone]       Fax: [phone]   RxID:   [id number] ZOCOR 20 MG  TABS (SIMVASTATIN) 1 qhs  #100 Each x 4   Entered and Authorized by:   Roderick Pee MD   Signed by:   Roderick Pee MD on 11/18/2008   Method used:   Electronically to        [facility] #[id number]* (retail)       [address]       [place], [place]  [address]       Ph: [phone]       Fax: [phone]   RxID:   [id number] SYNTHROID 50 MCG  TABS (LEVOTHYROXINE SODIUM) Take 1 tablet by mouth once a day  #100 x 4   Entered and Authorized by:   Roderick Pee MD   Signed by:   Roderick Pee MD on 11/18/2008   Method used:   Electronically to        [facility] #[id number]* (retail)       [address]        [place], [place]  [address]       Ph: [phone]       Fax: [phone]   RxID:   [id number] KLOR-CON M20 20 MEQ  TBCR (POTASSIUM CHLORIDE CRYS CR) Take 1 capsule by mouth once a day  #100 x 4   Entered and Authorized by:   Roderick Pee MD   Signed by:   Roderick Pee MD on 11/18/2008   Method used:   Electronically to        [facility] #[id number]* (retail)       [address]       [place], [place]  [address]       Ph: [phone]       Fax: [phone]   RxID:   [id number]          Preventive Care Screening  Colonoscopy:    Date:  06/20/2007    Results:  normal    Laboratory Results   Urine Tests    Routine Urinalysis   Color: yellow Appearance: Clear Glucose: negative   (Normal Range: Negative) Bilirubin: negative   (Normal Range: Negative) Ketone: negative   (Normal Range: Negative) Spec. Gravity: 1.020   (Normal Range: 1.003-1.035) Blood: negative   (Normal Range: Negative) pH: 6.5   (Normal Range: 5.0-8.0) Protein: negative   (Normal Range: Negative) Urobilinogen: 0.2   (Normal Range: 0-1) Nitrite: negative   (Normal Range: Negative) Leukocyte Esterace: negative   (Normal Range: Negative)    Comments: Joanne Chars CMA  November 18, 2008 1:22 PM

## 2010-09-03 NOTE — Assessment & Plan Note (Signed)
Summary: LOWER ABD PAIN/MHF   Vital Signs:  Patient profile:   65 year old female Height:      66 inches Weight:      213 pounds BP sitting:   110 / 74  (left arm) Cuff size:   regular  Vitals Entered By: Kern Reap CMA (November 14, 2008 12:38 PM)  Reason for Visit lower abd pain   History of Present Illness: [name] is a 65 year old female, who comes in today for evaluation of abdominal pain.  She, states she's had abdominal pain for the past two weeks.  She states her pain is, constant, it's crampy it's all over on a scale of one to 10 is denying and she can't sleep.  She said no fever, although she said last night.  She had a chill.  She's had no nausea, vomiting, or diarrhea.  Urinary habits have been normal.  She's had a recent colonoscopy that showed no evidence of diverticuli.  She states her bowel habits have been fairly normal.  No bleeding.  She's never had a pain like this before.  She had her uterus removed.  The ovaries were left intact.  Review of systems negative  Allergies: 1)  ! Asa 2)  Codeine  Past History:  Past medical history reviewed for relevance to current acute and chronic problems. Family history reviewed for relevance to current acute and chronic problems. Social history (including risk factors) reviewed for relevance to current acute and chronic problems.  Past Medical History:    Reviewed history from 11/16/2007 and no changes required:    Allergic rhinitis    GERD    Hypertension    Hypothyroidism    Osteoarthritis    TA    COSTOCHONDRITIS    TKR 09    childbirth x 3    TAH    redo total knee, right, February 2009  Family History:    Reviewed history from 02/27/2007 and no changes required:       her mother had rheumatoid arthritis.       Family History of Arthritis  Social History:    Reviewed history from 10/12/2007 and no changes required:       Single       Never Smoked       Alcohol use-no       Drug use-no       Regular  exercise-yes  Review of Systems      See HPI  Physical Exam  General:  Well-developed,well-nourished,in no acute distress; alert,appropriate and cooperative throughout examination Abdomen:  Bowel sounds positive,abdomen soft and non-tender without masses, organomegaly or hernias noted. Rectal:  No external abnormalities noted. Normal sphincter tone. No rectal masses or tenderness. Genitalia:  Pelvic Exam:        External: normal female genitalia without lesions or masses        Vagina: normal without lesions or masses        Cervix: normal without lesions or masses        Adnexa: normal bimanual exam without masses or fullness        Uterus: normal by palpation        Pap smear: not performed Msk:  No deformity or scoliosis noted of thoracic or lumbar spine.     Impression & Recommendations:  Problem # 1:  ABDOMINAL PAIN, UNSPECIFIED SITE (ICD-789.00) Assessment New  Orders: UA Dipstick w/o Micro (manual) (04540)  Complete Medication List: 1)  Klor-con M20 20 Meq Tbcr (Potassium chloride crys  cr) .... Take 1 capsule by mouth once a day 2)  Synthroid 50 Mcg Tabs (Levothyroxine sodium) .... Take 1 tablet by mouth once a day 3)  Zocor 20 Mg Tabs (Simvastatin) .[name] Kitchen.. 1 qhs 4)  Tenoretic 50 50-25 Mg Tabs (Atenolol-chlorthalidone) .... Take one tablet daily 5)  Flexeril 10 Mg Tabs (Cyclobenzaprine hcl) .... One 3xday 6)  Protonix 40 Mg Tbec (Pantoprazole sodium) .... Take 1 tablet by mouth every morning 7)  Prednisone 20 Mg Tabs (Prednisone) .... Uad 8)  Vicodin Es 7.5-750 Mg Tabs (Hydrocodone-acetaminophen) .[name] Kitchen.. 1 tab @ bedtime 9)  Flexeril 10 Mg Tabs (Cyclobenzaprine hcl) .[name] Kitchen.. 1 tab @ bedtime  Patient Instructions: 1)  go to the GI office now.  We made an appointment for you to see Amy at 230 he began an evaluation to determine the cause of your abdominal pain.  Laboratory Results   Urine Tests    Routine Urinalysis   Color: yellow Appearance: Clear Glucose: negative    (Normal Range: Negative) Bilirubin: negative   (Normal Range: Negative) Ketone: negative   (Normal Range: Negative) Spec. Gravity: <1.005   (Normal Range: 1.003-1.035) Blood: negative   (Normal Range: Negative) pH: 7.5   (Normal Range: 5.0-8.0) Protein: negative   (Normal Range: Negative) Urobilinogen: 0.2   (Normal Range: 0-1) Nitrite: negative   (Normal Range: Negative) Leukocyte Esterace: negative   (Normal Range: Negative)

## 2010-09-03 NOTE — Progress Notes (Signed)
Summary: rx       New/Updated Medications: ALLOPURINOL 300 MG TABS (ALLOPURINOL) take one tab by mouth once daily Prescriptions: ALLOPURINOL 300 MG TABS (ALLOPURINOL) take one tab by mouth once daily  #100 x 3   Entered by:   Kern Reap CMA (AAMA)   Authorized by:   Roderick Pee MD   Signed by:   Kern Reap CMA (AAMA) on 12/26/2009   Method used:   Electronically to        [facility] #[id number]* (retail)       [address]       [place], [place]  [address]       Ph: [phone]       Fax: [phone]   RxID:   [id number]

## 2010-09-03 NOTE — Assessment & Plan Note (Signed)
Summary: shoulder,neck, back pain/jls   Vital Signs:  Patient profile:   65 year old female Height:      66 inches Weight:      215 pounds BMI:     34.83 Temp:     98.4 degrees F oral BP sitting:   120 / 78  (left arm) Cuff size:   regular  Vitals Entered By: Kern Reap CMA (October 24, 2008 12:25 PM)  Reason for Visit left shoulder,back, neck pain  History of Present Illness: [name] is a 65 year old female, who has underlying rheumatoid arthritis to stop taking her anti-inflammatories from the rheumatologist because they cause GI side effects or comes in today for evaluation of left shoulder pain.  She felt well when she went to bed woke up this morning with left shoulder pain.  It also hurts in her neck.  She's had episodic flares like this in the past.  Allergies: 1)  ! Asa 2)  Codeine  Past History:  Past medical history reviewed for relevance to current acute and chronic problems. Family history reviewed for relevance to current acute and chronic problems. Social history (including risk factors) reviewed for relevance to current acute and chronic problems.  Past Medical History:    Reviewed history from 11/16/2007 and no changes required:    Allergic rhinitis    GERD    Hypertension    Hypothyroidism    Osteoarthritis    TA    COSTOCHONDRITIS    TKR 09    childbirth x 3    TAH    redo total knee, right, February 2009  Family History:    Reviewed history from 02/27/2007 and no changes required:       her mother had rheumatoid arthritis.       Family History of Arthritis  Social History:    Reviewed history from 10/12/2007 and no changes required:       Single       Never Smoked       Alcohol use-no       Drug use-no       Regular exercise-yes  Review of Systems      See HPI  Physical Exam  General:  Well-developed,well-nourished,in no acute distress; alert,appropriate and cooperative throughout examination Msk:  musculoskeletal exam normal except  for tenderness of posterior left shoulder Pulses:  R and L carotid,radial,femoral,dorsalis pedis and posterior tibial pulses are full and equal bilaterally Extremities:  No clubbing, cyanosis, edema, or deformity noted with normal full range of motion of all joints.   Neurologic:  No cranial nerve deficits noted. Station and gait are normal. Plantar reflexes are down-going bilaterally. DTRs are symmetrical throughout. Sensory, motor and coordinative functions appear intact.   Impression & Recommendations:  Problem # 1:  ARTHRITIS, RHEUMATOID (ICD-714.0) Assessment Deteriorated  Orders: Prescription Created Electronically [phone])  Complete Medication List: 1)  Klor-con M20 20 Meq Tbcr (Potassium chloride crys cr) .... Take 1 capsule by mouth once a day 2)  Synthroid 50 Mcg Tabs (Levothyroxine sodium) .... Take 1 tablet by mouth once a day 3)  Zocor 20 Mg Tabs (Simvastatin) .[name] Kitchen.. 1 qhs 4)  Tenoretic 50 50-25 Mg Tabs (Atenolol-chlorthalidone) .... Take one tablet daily 5)  Flexeril 10 Mg Tabs (Cyclobenzaprine hcl) .... One 3xday 6)  Protonix 40 Mg Tbec (Pantoprazole sodium) .... Take 1 tablet by mouth every morning 7)  Prednisone 20 Mg Tabs (Prednisone) .... Uad 8)  Vicodin Es 7.5-750 Mg Tabs (Hydrocodone-acetaminophen) .[name] Kitchen.. 1 tab @ bedtime 9)  Flexeril 10 Mg Tabs (Cyclobenzaprine hcl) .[name] Kitchen.. 1 tab @ bedtime  Patient Instructions: 1)  begin prednisone, take two tablets daily for 3 days, one for 3 days, a half a tablet a day for 3 days, then half a tablet Monday, Wednesday, Friday, for a two week taper. 2)  He may also take Flexeril and/or V at bedtime as needed for severe nighttime pain.  If the above medication.  Does not help, then go see a rheumatologist Prescriptions: FLEXERIL 10 MG TABS (CYCLOBENZAPRINE HCL) 1 tab @ bedtime  #40 x 1   Entered and Authorized by:   Roderick Pee MD   Signed by:   Roderick Pee MD on 10/24/2008   Method used:   Print then Give to Patient   RxID:    [id number] VICODIN ES 7.5-750 MG TABS (HYDROCODONE-ACETAMINOPHEN) 1 tab @ bedtime  #40 x 1   Entered and Authorized by:   Roderick Pee MD   Signed by:   Roderick Pee MD on 10/24/2008   Method used:   Print then Give to Patient   RxID:   [id number] PREDNISONE 20 MG TABS (PREDNISONE) UAD  #40 x 1   Entered and Authorized by:   Roderick Pee MD   Signed by:   Roderick Pee MD on 10/24/2008   Method used:   Electronically to        [facility] #[id number]* (retail)       [address] [place], [place]  [address]       Ph: [phone]       Fax: [phone]   RxID:   [id number]

## 2010-09-03 NOTE — Assessment & Plan Note (Signed)
Summary: FU ON ARTHRITIS/NJR   Vital Signs:  Patient Profile:   65 Years Old Female Height:     66 inches Weight:      219 pounds BMI:     35.48 BSA:     2.08 Temp:     98.1 degrees F oral BP sitting:   136 / 981  (right arm)  Pt. in pain?   yes    Location:   arms,fingers,knee    Intensity:   8    Type:       dull&sharp                Chief Complaint:  F/U R. arthritis.  History of Present Illness: [name] comes in today for evaluation of rheumatoid arthritis.  She is currently seeing a rheumatologist for evaluation of her arthritis because is getting worse.  She says the rheumatologist told her, that she's bordering on lupus.  She therefore wants her to start on plaquenil.  She comes in with a list of potential side effects and concerns about the drug.  She wants a Pneumovax and she would like to discuss with me.  This treatment.  She's had a history of a total knee replacement in the right knee degenerative surgery in her spine in 08.  She also says she is now an ex-smoker.  She quit in January of 08.  Acute Visit History:      She denies abdominal pain, chest pain, constipation, cough, diarrhea, earache, eye symptoms, fever, genitourinary symptoms, headache, musculoskeletal symptoms, nasal discharge, nausea, rash, sinus problems, sore throat, and vomiting.         Current Allergies: ! ASA CODEINE  Past Medical History:    Reviewed history from 01/19/2007 and no changes required:       Allergic rhinitis       GERD       Hypertension       Hypothyroidism       Osteoarthritis       TA       COSTOCHONDRITIS   Family History:    Reviewed history and no changes required:       her mother had rheumatoid arthritis.       Family History of Arthritis  Social History:    Reviewed history and no changes required:   Risk Factors:  Passive smoke exposure:  no Drug use:  no HIV high-risk behavior:  no   Review of Systems      See HPI   Physical Exam  General:    Well-developed,well-nourished,in no acute distress; alert,appropriate and cooperative throughout examination Head:     Normocephalic and atraumatic without obvious abnormalities. No apparent alopecia or balding. Eyes:     No corneal or conjunctival inflammation noted. EOMI. Perrla. Funduscopic exam benign, without hemorrhages, exudates or papilledema. Vision grossly normal. Ears:     External ear exam shows no significant lesions or deformities.  Otoscopic examination reveals clear canals, tympanic membranes are intact bilaterally without bulging, retraction, inflammation or discharge. Hearing is grossly normal bilaterally. Nose:     External nasal examination shows no deformity or inflammation. Nasal mucosa are pink and moist without lesions or exudates. Chest Wall:     No deformities, masses, or tenderness noted. Lungs:     Normal respiratory effort, chest expands symmetrically. Lungs are clear to auscultation, no crackles or wheezes. Heart:     Normal rate and regular rhythm. S1 and S2 normal without gallop, murmur, click, rub or other extra sounds.  Skin:     there is a black lesion at 12 o'clock on her right breast.  However, on physical exam its a blocked sebaceous cyst.  Not a more freckled.    Impression & Recommendations:  Problem # 1:  ARTHRITIS, RHEUMATOID (ICD-714.0)  Her updated medication list for this problem includes:    Diclofenac Sodium 75 Mg Tbec (Diclofenac sodium) .[name] Kitchen... 2 tabs bid    Prednisone 20 Mg Tabs (Prednisone) .[name] Kitchen... As needed   Problem # 2:  SEBACEOUS CYST, BREAST (ICD-610.8) Assessment: Unchanged  Medications Added to Medication List This Visit: 1)  Klor-con M20 20 Meq Tbcr (Potassium chloride crys cr) .... Take 1 capsule by mouth once a day 2)  Synthroid 50 Mcg Tabs (Levothyroxine sodium) .... Take 1 tablet by mouth once a day 3)  Diclofenac Sodium 75 Mg Tbec (Diclofenac sodium) .... 2 tabs bid 4)  Zocor 20 Mg Tabs (Simvastatin) .[name] Kitchen.. 1 qhs 5)   Tenoretic 50 50-25 Mg Tabs (Atenolol-chlorthalidone) .... 1/2 qam 6)  Prednisone 20 Mg Tabs (Prednisone) .... As needed 7)  Vicodin Es 7.5-750 Mg Tabs (Hydrocodone-acetaminophen) .... Prn 8)  Flexeril 10 Mg Tabs (Cyclobenzaprine hcl) .... 1/2 tid prn  Other Orders: Pneumococcal Vaccine (02725) Admin 1st Vaccine (36644)   Patient Instructions: 1)  after reviewing her medical history I concurred that plaque when L. would be a good choice for her.  I reviewed all the instructions on the form including the follow-up laboratory studies that need to be done on a periodic basis.  We talked about the pluses and minuses of treatment, and the fact that she is getting worse on her current therapy.  She's also gained weight because she stop smoking and the extra weight.  Will also be difficult on her joints.  We encouraged her to walk on a daily basis and hopefully after treatment with the plaque.  When L. should become more active and can get her weight off. 2)  We also discussed that she does have a sebaceous cyst, but will observe that since she is asymptomatic at this point. 3)  She is to return to the rheumatologist for further treatment and to see [place] in April of 2009 for annual physical.       Pneumovax Vaccine    Vaccine Type: Pneumovax    Site: left deltoid    Mfr: [employer]    Dose: 0.5 ml    Route: IM    Given by: Arcola Jansky, RN    Exp. Date: 05/03/2008    Lot #: [id number]    VIS given: 02/28/96 version given February 27, 2007.

## 2010-09-03 NOTE — Assessment & Plan Note (Signed)
Summary: headaches/cough/swollen foot/cjr   Vital Signs:  Patient profile:   65 year old female Weight:      229 pounds Temp:     98.3 degrees F oral BP sitting:   140 / 80  (left arm) Cuff size:   regular  Vitals Entered By: Kern Reap CMA Duncan Dull) (Dec 25, 2009 12:16 PM) CC: gout left foot   Primary Care Provider:  Governor Specking, MD  CC:  gout left foot.  History of Present Illness: [name] is a 65 year old female, who comes in today for evaluation of 3 problems.  She has a history of underlying allergic rhinitis takes Claritin 10 mg daily, but despite that has had 3 to 4 days of head congestion, postnasal drip, and cough.  She states she is not wheezing.  Cells has pain in her left great toe for 3 days.  She thinks it might be gout   Two of her children have gout.  She has a history of underlying rheumatoid arthritis, for which he takes Ultram 50 mg 4 times a day Mobic 7.5 mg daily from her rheumatologist.  However, her rheumatologist, cannot see her in until October.  She is now having more trouble with symmetrical polyarthralgias.  Allergies: 1)  ! Asa 2)  Codeine  Past History:  Past medical, surgical, family and social histories (including risk factors) reviewed for relevance to current acute and chronic problems.  Past Medical History: Reviewed history from 11/16/2007 and no changes required. Allergic rhinitis GERD Hypertension Hypothyroidism Osteoarthritis TA COSTOCHONDRITIS TKR 09 childbirth x 3 TAH redo total knee, right, February 2009  Past Surgical History: Reviewed history from 08/19/2009 and no changes required. HYSTERECTOMY R TKR H[facility]  [name]  Family History: Reviewed history from 02/27/2007 and no changes required. her mother had rheumatoid arthritis. Family History of Arthritis  Social History: Reviewed history from 04/10/2009 and no changes required. Single Never Smoked Alcohol use-no Drug use-no Regular  exercise-yes Former Smoker   6./10  Review of Systems      See HPI  Physical Exam  General:  Well-developed,well-nourished,in no acute distress; alert,appropriate and cooperative throughout examination Head:  Normocephalic and atraumatic without obvious abnormalities. No apparent alopecia or balding. Eyes:  No corneal or conjunctival inflammation noted. EOMI. Perrla. Funduscopic exam benign, without hemorrhages, exudates or papilledema. Vision grossly normal. Ears:  External ear exam shows no significant lesions or deformities.  Otoscopic examination reveals clear canals, tympanic membranes are intact bilaterally without bulging, retraction, inflammation or discharge. Hearing is grossly normal bilaterally. Nose:  External nasal examination shows no deformity or inflammation. Nasal mucosa are pink and moist without lesions or exudates. Mouth:  Oral mucosa and oropharynx without lesions or exudates.  Teeth in good repair. Neck:  No deformities, masses, or tenderness noted. Chest Wall:  No deformities, masses, or tenderness noted. Lungs:  Normal respiratory effort, chest expands symmetrically. Lungs are clear to auscultation, no crackles or wheezes. Msk:  No deformity or scoliosis noted of thoracic or lumbar spine.  tenderness to palpation in left great toe.  No swelling Pulses:  R and L carotid,radial,femoral,dorsalis pedis and posterior tibial pulses are full and equal bilaterally Extremities:  No clubbing, cyanosis, edema, or deformity noted with normal full range of motion of all joints.   Neurologic:  No cranial nerve deficits noted. Station and gait are normal. Plantar reflexes are down-going bilaterally. DTRs are symmetrical throughout. Sensory, motor and coordinative functions appear intact.   Impression & Recommendations:  Problem # 1:  ARTHRITIS, RHEUMATOID (  ICD-714.0) Assessment Deteriorated  Orders: Venipuncture (16109) TLB-BMP (Basic Metabolic Panel-BMET)  (80048-METABOL) TLB-CBC Platelet - w/Differential (85025-CBCD) TLB-Uric Acid, Blood (84550-URIC) Prescription Created Electronically [id number])  Problem # 2:  ALLERGIC RHINITIS (ICD-477.9) Assessment: Deteriorated  Orders: Venipuncture (09811) TLB-BMP (Basic Metabolic Panel-BMET) (80048-METABOL) TLB-CBC Platelet - w/Differential (85025-CBCD) TLB-Uric Acid, Blood (84550-URIC) Prescription Created Electronically [id number])  Complete Medication List: 1)  Klor-con M20 20 Meq Tbcr (Potassium chloride crys cr) .... Take 1 capsule by mouth once a day 2)  Synthroid 50 Mcg Tabs (Levothyroxine sodium) .... Take 1 tablet by mouth once a day 3)  Zocor 20 Mg Tabs (Simvastatin) .[name] Kitchen.. 1 qhs 4)  Tenoretic 50 50-25 Mg Tabs (Atenolol-chlorthalidone) .... Take one tablet daily 5)  Flexeril 10 Mg Tabs (Cyclobenzaprine hcl) .... Take 1 tab three times a day as needed 6)  Prednisone 20 Mg Tabs (Prednisone) .... Uad 7)  Ultram 50 Mg Tabs (Tramadol hcl) .... Take 1 tab every 6-8 hours for pain 8)  Mobic 7.5 Mg Tabs (Meloxicam) .... Take one tab once daily 9)  Pantoprazole Sodium 40 Mg Tbec (Pantoprazole sodium) .... Take one tab by mouth once daily 10)  Effer-k 20 Meq Tbef (Potassium bicarb-citric acid) .... One tab once daily  Patient Instructions: 1)  begin prednisone, one tablet x 5 days, one half tablet x 5 days, then one half tablet Monday, Wednesday, Friday, for 3 weeks.  Return in 4 weeks for follow-up. 2)  I will call you with report of your lab work. 3)  Continue the Claritin daily for your allergies or switch to plain Zyrtec one tablet at bedtime 4)  stop the Mobic Prescriptions: PREDNISONE 20 MG TABS (PREDNISONE) UAD  #50 x 1   Entered and Authorized by:   Roderick Pee MD   Signed by:   Roderick Pee MD on 12/25/2009   Method used:   Electronically to        [facility] #[id number]* (retail)       [address] [place], [place]  [address]       Ph: [phone]       Fax:  [phone]   RxID:   [id number] PREDNISONE 20 MG TABS (PREDNISONE) UAD  #50 x 1   Entered and Authorized by:   Roderick Pee MD   Signed by:   Roderick Pee MD on 12/25/2009   Method used:   Electronically to        [facility] #[id number]* (retail)       [address]       [place], [place]  [address]       Ph: [phone]       Fax: [phone]   RxID:   [id number]

## 2010-09-03 NOTE — Assessment & Plan Note (Signed)
Summary: muscle pain and ear pain/cdj    Vital Signs:  Patient Profile:   65 Years Old Female Height:     66 inches (167.64 cm) Temp:     97.8 degrees F oral Pulse rate:   84 / minute BP sitting:   133 / 92  (left arm)  Vitals Entered By: Doristine Devoid (September 02, 2007 11:28 AM)                 Chief Complaint:  neck pain and ears throbbing.  History of Present Illness: is having some neck pain  moved fromL to  R side and now ear hurts on R as well ? if muscle spasm  saw ortho last week- and he gave her a muscle relaxer - 500 mg ? name  some sore throat now- and some nasal congestion no fever , but a lot of drainage no chills , no body aches  no cough at all some headache yesterday- which is unusual has prednisone-at home for muscle spasm as needed- took one dose last nt  Current Allergies: ! ASA CODEINE     Review of Systems      See HPI  Eyes      Denies eye irritation and eye pain.  ENT      Denies sore throat.  CV      Denies chest pain or discomfort.  Resp      Denies cough and shortness of breath.  Derm      Denies rash.  Neuro      Denies numbness and weakness.   Physical Exam  General:     Well-developed,well-nourished,in no acute distress; alert,appropriate and cooperative throughout examination Head:     normocephalic, atraumatic, and no abnormalities observed.  no sinus tenderness Eyes:     vision grossly intact, pupils equal, pupils round, and pupils reactive to light.   Ears:     R ear normal and L ear normal.   Nose:     no nasal discharge.   Mouth:     pharynx pink and moist.   Neck:     supple, no masses, no thyromegaly, and no JVD.  tender over L paracervical muscles and trapezius bilat no bony tendereness pain to fully extend and rotate L  Lungs:     Normal respiratory effort, chest expands symmetrically. Lungs are clear to auscultation, no crackles or wheezes. Heart:     Normal rate and regular rhythm. S1 and  S2 normal without gallop, murmur, click, rub or other extra sounds. Neurologic:     sensation intact to light touch.   Skin:     Intact without suspicious lesions or rashes Cervical Nodes:     No lymphadenopathy noted Psych:     nl affect    Impression & Recommendations:  Problem # 1:  NECK PAIN, ACUTE (ICD-723.1) with mucular tenderness and limited rotatiion suspect spasm will use heat and gentle stretches hold diclofenac and use the prednisone 20 daily for 5days then f/u with primary doctor if not improved adv to update if any numbness/weakness or rad of pain to hands or arms Her updated medication list for this problem includes:    Diclofenac Sodium 75 Mg Tbec (Diclofenac sodium) .[name] Kitchen... 2 tabs bid    Vicodin Es 7.5-750 Mg Tabs (Hydrocodone-acetaminophen) .[name] Kitchen... Prn    Flexeril 10 Mg Tabs (Cyclobenzaprine hcl) .[name] Kitchen... 1/2 tid prn    Flexeril 10 Mg Tabs (Cyclobenzaprine hcl) ..... One 3xday    Vicodin Es 7.5-750  Mg Tabs (Hydrocodone-acetaminophen) ..... One 3x day   Complete Medication List: 1)  Klor-con M20 20 Meq Tbcr (Potassium chloride crys cr) .... Take 1 capsule by mouth once a day 2)  Synthroid 50 Mcg Tabs (Levothyroxine sodium) .... Take 1 tablet by mouth once a day 3)  Diclofenac Sodium 75 Mg Tbec (Diclofenac sodium) .... 2 tabs bid 4)  Zocor 20 Mg Tabs (Simvastatin) .[name] Kitchen.. 1 qhs 5)  Tenoretic 50 50-25 Mg Tabs (Atenolol-chlorthalidone) .... 1/2 qam 6)  Prednisone 20 Mg Tabs (Prednisone) .... As needed 7)  Vicodin Es 7.5-750 Mg Tabs (Hydrocodone-acetaminophen) .... Prn 8)  Flexeril 10 Mg Tabs (Cyclobenzaprine hcl) .... 1/2 tid prn 9)  Prednisone 20 Mg Tabs (Prednisone) .... Uad 10)  Flexeril 10 Mg Tabs (Cyclobenzaprine hcl) .... One 3xday 11)  Vicodin Es 7.5-750 Mg Tabs (Hydrocodone-acetaminophen) .... One 3x day   Patient Instructions: 1)  keep using some heat on neck 2)  do gentle stretching 3)  hold your diclofenac and take prednisone 20 mg daily for 5 days 4)   use muscle relaxer as needed  5)  is not improved in 5 day follow up with your doctor  6)  if pain becomes severe or fever or other symptoms- call or go to emergency room    ]

## 2010-09-03 NOTE — Assessment & Plan Note (Signed)
Summary: RIGHT SIDE PAIN/NJR  Medications Added PREDNISONE 20 MG  TABS (PREDNISONE) UAD FLEXERIL 10 MG  TABS (CYCLOBENZAPRINE HCL) one 3xday VICODIN ES 7.5-750 MG  TABS (HYDROCODONE-ACETAMINOPHEN) one 3x day        Vital Signs:  Patient Profile:   65 Years Old Female Height:     66 inches (167.64 cm) Weight:      219 pounds (99.55 kg) Temp:     98.1 degrees F (36.72 degrees C) oral BP sitting:   120 / 80  (left arm)  Pt. in pain?   yes    Location:   r side    Intensity:   9    Type:       spasm  Vitals Entered By: Arcola Jansky, RN (May 08, 2007 1:59 PM)                  Chief Complaint:  "yest.- rt side spasms".  History of Present Illness: [name] is a 65 year old female, who comes in today for evaluation of back pain.  Said she felt well until 10 5 2008, when she experienced right hip pain.  Does not radiate sharp on a scale of one to 10 is a 9 1/2.  She had an episode like this about two months ago.  It lasted two or 3 days and went away with bed rest.  She had orthopedic problems in the past.  She's also had this surgically Dr. Theresa Duty.  Review of systems negative.  It does not radiate.  Acute Visit History:      She denies abdominal pain, chest pain, constipation, cough, diarrhea, earache, eye symptoms, fever, genitourinary symptoms, headache, musculoskeletal symptoms, nasal discharge, nausea, rash, sinus problems, sore throat, and vomiting.        Current Allergies: ! ASA CODEINE     Review of Systems      See HPI   Physical Exam  General:     Well-developed,well-nourished,in no acute distress; alert,appropriate and cooperative throughout examination Msk:     No deformity or scoliosis noted of thoracic or lumbar spine.   Pulses:     R and L carotid,radial,femoral,dorsalis pedis and posterior tibial pulses are full and equal bilaterally Extremities:     No clubbing, cyanosis, edema, or deformity noted with normal full range of motion of all  joints.   Neurologic:     No cranial nerve deficits noted. Station and gait are normal. Plantar reflexes are down-going bilaterally. DTRs are symmetrical throughout. Sensory, motor and coordinative functions appear intact. Skin:     Intact without suspicious lesions or rashes    Impression & Recommendations:  Problem # 1:  SCIATICA (ICD-724.3) Assessment: New  Her updated medication list for this problem includes:    Diclofenac Sodium 75 Mg Tbec (Diclofenac sodium) .[name] Kitchen... 2 tabs bid    Vicodin Es 7.5-750 Mg Tabs (Hydrocodone-acetaminophen) .[name] Kitchen... Prn    Flexeril 10 Mg Tabs (Cyclobenzaprine hcl) .[name] Kitchen... 1/2 tid prn    Flexeril 10 Mg Tabs (Cyclobenzaprine hcl) ..... One 3xday    Vicodin Es 7.5-750 Mg Tabs (Hydrocodone-acetaminophen) ..... One 3x day  lot [id number], EXP 30 jun 09, [employer] left deltoid IM, 0.5 cc.   Complete Medication List: 1)  Klor-con M20 20 Meq Tbcr (Potassium chloride crys cr) .... Take 1 capsule by mouth once a day 2)  Synthroid 50 Mcg Tabs (Levothyroxine sodium) .... Take 1 tablet by mouth once a day 3)  Diclofenac Sodium 75 Mg Tbec (Diclofenac sodium) .[name] KitchenMarland KitchenMarland Kitchen  2 tabs bid 4)  Zocor 20 Mg Tabs (Simvastatin) .[name] Kitchen.. 1 qhs 5)  Tenoretic 50 50-25 Mg Tabs (Atenolol-chlorthalidone) .... 1/2 qam 6)  Prednisone 20 Mg Tabs (Prednisone) .... As needed 7)  Vicodin Es 7.5-750 Mg Tabs (Hydrocodone-acetaminophen) .... Prn 8)  Flexeril 10 Mg Tabs (Cyclobenzaprine hcl) .... 1/2 tid prn 9)  Prednisone 20 Mg Tabs (Prednisone) .... Uad 10)  Flexeril 10 Mg Tabs (Cyclobenzaprine hcl) .... One 3xday 11)  Vicodin Es 7.5-750 Mg Tabs (Hydrocodone-acetaminophen) .... One 3x day  Other Orders: Flu Vaccine 21yrs + (82956) Admin 1st Vaccine (21308)    Patient Instructions: 1)  take prednisone 20 mg two a day for 3 days, one a day for 3 days, then half a tablet a day for 3 days, then stop.  Take the Flexeril, 10 mg 3 times a day, and Vicodin ES 3 times a day for severe pain. 2)  Stay bed  rest today.  All day Tuesday, and all day Wednesday.  Thursday get up, move around, and thing she did slowly better as they have in the past.  Return p.r.n.    Prescriptions: VICODIN ES 7.5-750 MG  TABS (HYDROCODONE-ACETAMINOPHEN) one 3x day  #30 x 1   Entered and Authorized by:   Roderick Pee MD   Signed by:   Roderick Pee MD on 05/08/2007   Method used:   Print then Give to Patient   RxID:   [id number] FLEXERIL 10 MG  TABS (CYCLOBENZAPRINE HCL) one 3xday  #30 x 1   Entered and Authorized by:   Roderick Pee MD   Signed by:   Roderick Pee MD on 05/08/2007   Method used:   Print then Give to Patient   RxID:   [id number] PREDNISONE 20 MG  TABS (PREDNISONE) UAD  #30 x 1   Entered and Authorized by:   Roderick Pee MD   Signed by:   Roderick Pee MD on 05/08/2007   Method used:   Print then Give to Patient   RxID:   [id number]  ]  Influenza Vaccine    Vaccine Type: Fluvax 3+    Given by: Arcola Jansky, RN  Flu Vaccine Consent Questions    Do you have a history of severe allergic reactions to this vaccine? no    Any prior history of allergic reactions to egg and/or gelatin? no    Do you have a sensitivity to the preservative Thimersol? no    Do you have a past history of Guillan-Barre Syndrome? no    Do you currently have an acute febrile illness? no    Have you ever had a severe reaction to latex? no    Vaccine information given and explained to patient? yes    Are you currently pregnant? no

## 2010-09-03 NOTE — Assessment & Plan Note (Signed)
Summary: arm pain/arm and fingers are swollen/jls   Vital Signs:  Patient Profile:   65 Years Old Female Height:     66 inches (167.64 cm) Weight:      203.5 pounds Temp:     98.1 degrees F oral BP sitting:   114 / 74  (left arm) Cuff size:   large  Vitals Entered By: Alfred Levins, CMA (March 22, 2008 3:24 PM)                 Chief Complaint:  rt arm pain x 4 days.  History of Present Illness: One week of pain in the right elbow. No trauma, but she has been using it a lot this weekwith cleaning house and preparing garden vegetables. On Diclofenac two times a day .    Current Allergies: ! ASA CODEINE  Past Medical History:    Reviewed history from 11/16/2007 and no changes required:       Allergic rhinitis       GERD       Hypertension       Hypothyroidism       Osteoarthritis       TA       COSTOCHONDRITIS       TKR 09       childbirth x 3       TAH       redo total knee, right, February 2009     Review of Systems  The patient denies anorexia, fever, weight loss, weight gain, vision loss, decreased hearing, hoarseness, chest pain, syncope, dyspnea on exertion, peripheral edema, prolonged cough, headaches, hemoptysis, abdominal pain, melena, hematochezia, severe indigestion/heartburn, hematuria, incontinence, genital sores, muscle weakness, suspicious skin lesions, transient blindness, difficulty walking, depression, unusual weight change, abnormal bleeding, enlarged lymph nodes, angioedema, breast masses, and testicular masses.     Physical Exam  General:     Well-developed,well-nourished,in no acute distress; alert,appropriate and cooperative throughout examination Msk:     tender over the right lateral epicondyle    Impression & Recommendations:  Problem # 1:  LATERAL EPICONDYLITIS (ICD-726.32)  Complete Medication List: 1)  Klor-con M20 20 Meq Tbcr (Potassium chloride crys cr) .... Take 1 capsule by mouth once a day 2)  Synthroid 50 Mcg Tabs  (Levothyroxine sodium) .... Take 1 tablet by mouth once a day 3)  Diclofenac Sodium 75 Mg Tbec (Diclofenac sodium) .... Two times a day 4)  Zocor 20 Mg Tabs (Simvastatin) .[name] Kitchen.. 1 qhs 5)  Tenoretic 50 50-25 Mg Tabs (Atenolol-chlorthalidone) .... Take one tablet daily 6)  Flexeril 10 Mg Tabs (Cyclobenzaprine hcl) .... One 3xday 7)  Vicodin Es 7.5-750 Mg Tabs (Hydrocodone-acetaminophen) .... One 3x day 8)  Protonix 40 Mg Tbec (Pantoprazole sodium) .... Take 1 tablet by mouth every morning   Patient Instructions: 1)  rest, ice packs.  2)  Please schedule a follow-up appointment as needed.   ]

## 2010-09-03 NOTE — Assessment & Plan Note (Signed)
Summary: cold symptoms/?inner ear problems/pt feels off balance/cjr   Vital Signs:  Patient profile:   65 year old female Weight:      220 pounds Temp:     98.2 degrees F oral BP sitting:   122 / 82  (left arm) Cuff size:   regular  Vitals Entered By: Kern Reap CMA Duncan Dull) (June 15, 2010 11:17 AM) CC: follow-up visit, right side pain, sinus pressure   Primary Care Provider:  Governor Specking, MD  CC:  follow-up visit, right side pain, and sinus pressure.  History of Present Illness: [name] is a 65 year old female, who comes in today for follow-up of 3 problems.  We saw her a on November the second with multiple complaints.  At that time.  It was also time to check a blood sugar, which was 97.  Hemoglobin A1c6 .3%.  Potassium low at 3.1 and one, potassium tablet, daily.  She's been taking two a day.  She said, trouble with her allergies have flared up.  She is taking over-the-counter Claritin, and/or Zyrtec, still symptomatic with head congestion, postnasal drip, etc. no asthma.  Two weeks ago she fell as a soreness in her right ribs are still sore.  No difficulty breathing review of systems otherwise negative  Allergies: 1)  ! Asa 2)  Codeine  Past History:  Past medical, surgical, family and social histories (including risk factors) reviewed for relevance to current acute and chronic problems.  Past Medical History: Reviewed history from 11/16/2007 and no changes required. Allergic rhinitis GERD Hypertension Hypothyroidism Osteoarthritis TA COSTOCHONDRITIS TKR 09 childbirth x 3 TAH redo total knee, right, February 2009  Past Surgical History: Reviewed history from 08/19/2009 and no changes required. HYSTERECTOMY R TKR [facility]NP [facility]phrectomy  [name]  Family History: Reviewed history from 01/22/2010 and no changes required. her mother had rheumatoid arthritis. Family History of Arthritis Family History Diabetes 1st degree relative  Social History: Reviewed  history from 04/10/2009 and no changes required. Single Never Smoked Alcohol use-no Drug use-no Regular exercise-yes Former Smoker   6./10  Review of Systems      See HPI  Physical Exam  General:  Well-developed,well-nourished,in no acute distress; alert,appropriate and cooperative throughout examination Head:  Normocephalic and atraumatic without obvious abnormalities. No apparent alopecia or balding. Eyes:  No corneal or conjunctival inflammation noted. EOMI. Perrla. Funduscopic exam benign, without hemorrhages, exudates or papilledema. Vision grossly normal. Ears:  External ear exam shows no significant lesions or deformities.  Otoscopic examination reveals clear canals, tympanic membranes are intact bilaterally without bulging, retraction, inflammation or discharge. Hearing is grossly normal bilaterally. Nose:  External nasal examination shows no deformity or inflammation. Nasal mucosa are pink and moist without lesions or exudates. Mouth:  Oral mucosa and oropharynx without lesions or exudates.  Teeth in good repair. Chest Wall:  tender to palpation in the right lateral ribs Abdomen:  Bowel sounds positive,abdomen soft and non-tender without masses, organomegaly or hernias noted.   Impression & Recommendations:  Problem # 1:  CHEST PAIN (ICD-786.50) Assessment Unchanged  Problem # 2:  ALLERGIC RHINITIS (ICD-477.9) Assessment: Deteriorated  Her updated medication list for this problem includes:    Flonase 50 Mcg/act Susp (Fluticasone propionate) ..... Uad  Complete Medication List: 1)  Klor-con M20 20 Meq Tbcr (Potassium chloride crys cr) .... Take 2 capsule by mouth once a day 2)  Synthroid 50 Mcg Tabs (Levothyroxine sodium) .... Take 1 tablet by mouth once a day 3)  Zocor 20 Mg Tabs (Simvastatin) .[name] Kitchen.. 1 qhs  4)  Tenoretic 50 50-25 Mg Tabs (Atenolol-chlorthalidone) .... Take one tablet daily 5)  Pantoprazole Sodium 40 Mg Tbec (Pantoprazole sodium) .... Take one tab by mouth  once daily 6)  Effer-k 20 Meq Tbef (Potassium bicarb-citric acid) .... One tab once daily 7)  Accu-chek Aviva Strp (Glucose blood) .... Use once daily for glucose control 8)  Lancets Misc (Lancets) .... Use once daily for for glucose control 9)  Metformin Hcl 500 Mg Tabs (Metformin hcl) .... 1/2 qam 10)  Flonase 50 Mcg/act Susp (Fluticasone propionate) .... Uad  Patient Instructions: 1)   one shot of the steroid nasal spray up each nostril at bedtime.  Continue the Zyrtec 10 mg plane at bedtime. 2)  Motrin 600 mg twice a day with food for the chest wall pain 3)  Please schedule a follow-up appointment as needed. 4)  Return in April for y   annual complete exam 5)  BMP prior to visit, ICD-9: 6)  Hepatic Panel prior to visit, ICD-9:;;;;;;;;;;;;;;;;;    250.00 7)  Lipid Panel prior to visit, ICD-9: 8)  TSH prior to visit, ICD-9: 9)  CBC w/ Diff prior to visit, ICD-9: 10)  Urine-dip prior to visit, ICD-9: 11)  HbgA1C prior to visit, ICD-9: 12)  Urine Microalbumin prior to visit, ICD-9: Prescriptions: KLOR-CON M20 20 MEQ  TBCR (POTASSIUM CHLORIDE CRYS CR) Take 2 capsule by mouth once a day  #200 x 3   Entered and Authorized by:   Roderick Pee MD   Signed by:   Roderick Pee MD on 06/15/2010   Method used:   Electronically to        [facility] #[id number]* (retail)       [address]       [place], [place]  [address]       Ph: [phone]       Fax: [phone]   RxID:   [id number] FLONASE 50 MCG/ACT SUSP (FLUTICASONE PROPIONATE) UAD  #1 unit x 6   Entered and Authorized by:   Roderick Pee MD   Signed by:   Roderick Pee MD on 06/15/2010   Method used:   Electronically to        [facility] #[id number]* (retail)       [address]       [place], [place]  [address]       Ph: [phone]       Fax: [phone]   RxID:   [id number]    Orders Added: 1)  Est. Patient Level III  [54627]

## 2010-09-03 NOTE — Progress Notes (Signed)
Summary: abd cramping  Phone Note Call from Patient Call back at [phone]   Caller: vm Call For: Keaun Schnabel Reason for Call: Talk to Nurse Summary of Call: Abd cramping 2-3 days.  Feel lousy.  Feel hot.  [facility] Called patient.  She'll take a Prilosec, doesn't think she has the Protonix anymore;  Suggested the Diclofenac might be upsetting her stomach & causing cramping.  Take lots of clear liquids.  Lay off pulpy juices & dairy.  If still feeling bad in am call for appt. Initial call taken by: Rudy Jew, RN,  May 02, 2008 5:08 PM

## 2010-09-03 NOTE — Assessment & Plan Note (Signed)
Summary: 1 month fup//ccm   Vital Signs:  Patient profile:   65 year old female Weight:      229 pounds Temp:     98.3 degrees F oral BP sitting:   130 / 80  (left arm) Cuff size:   large  Vitals Entered By: Kern Reap CMA Duncan Dull) (January 22, 2010 10:21 AM) CC: follow-up visit   Primary Care Provider:  Governor Specking, MD  CC:  follow-up visit.  History of Present Illness: Payzlee is a 65 year old female, who comes in today for evaluation of two problems.  We saw her a couple weeks ago with severe joint pain, consistent with acute gout.  We start her on prednisone 20 mg daily x 5 days with a taper.  She is now down to a half a tablet Monday, Wednesday, Friday.  She is pain free.  In addition, her knee pain that had been bothering her is also gone.  Uric acid level 74%.  She is also on allopurinol 300 mg daily.  Random blood sugar at that time was 181.  Random blood sugar today 3 hours p.c. 220.  Tumor brothers are diabetic fasting blood sugar of 4 months ago, was 110  Allergies: 1)  ! Asa 2)  Codeine  Family History: Reviewed history from 02/27/2007 and no changes required. her mother had rheumatoid arthritis. Family History of Arthritis Family History Diabetes 1st degree relative  Review of Systems      See HPI  Physical Exam  General:  Well-developed,well-nourished,in no acute distress; alert,appropriate and cooperative throughout examination Msk:  No deformity or scoliosis noted of thoracic or lumbar spine.     Impression & Recommendations:  Problem # 1:  HYPERGLYCEMIA (ICD-790.29) Assessment New  Orders: Glucose, (CBG) (65784)  Complete Medication List: 1)  Klor-con M20 20 Meq Tbcr (Potassium chloride crys cr) .... Take 1 capsule by mouth once a day 2)  Synthroid 50 Mcg Tabs (Levothyroxine sodium) .... Take 1 tablet by mouth once a day 3)  Zocor 20 Mg Tabs (Simvastatin) .[name] Kitchen.. 1 qhs 4)  Tenoretic 50 50-25 Mg Tabs (Atenolol-chlorthalidone) .... Take one tablet  daily 5)  Flexeril 10 Mg Tabs (Cyclobenzaprine hcl) .... Take 1 tab three times a day as needed 6)  Prednisone 20 Mg Tabs (Prednisone) .... Uad 7)  Ultram 50 Mg Tabs (Tramadol hcl) .... Take 1 tab every 6-8 hours for pain 8)  Mobic 7.5 Mg Tabs (Meloxicam) .... Take one tab once daily 9)  Pantoprazole Sodium 40 Mg Tbec (Pantoprazole sodium) .... Take one tab by mouth once daily 10)  Effer-k 20 Meq Tbef (Potassium bicarb-citric acid) .... One tab once daily 11)  Allopurinol 300 Mg Tabs (Allopurinol) .... Take one tab by mouth once daily 12)  Accu-chek Aviva Strp (Glucose blood) .... Use once daily for glucose control 13)  Lancets Misc (Lancets) .... Use once daily for for glucose control  Patient Instructions: 1)  begin a  carbohydrate free diet, drink, 20 ounces of water daily, walk 30 minutes daily, stop  the prednisone, take 600 mg of Motrin, one  hour before you walk daily. 2)  Check a fasting blood sugar daily.  Return in two weeks for follow-up.  When you return bring a record of all your blood sugar readings with you Prescriptions: LANCETS  MISC (LANCETS) use once daily for for glucose control  #100 x 3   Entered by:   Kern Reap CMA (AAMA)   Authorized by:   Roderick Pee MD  Signed by:   Kern Reap CMA (AAMA) on 01/22/2010   Method used:   Electronically to        [id number]* (retail)       [address] [place], [place]  [address]       Ph: [phone]       Fax: [phone]   RxID:   [id number] ACCU-CHEK AVIVA  STRP (GLUCOSE BLOOD) use once daily for glucose control  #100 x 3   Entered by:   Kern Reap CMA (AAMA)   Authorized by:   Roderick Pee MD   Signed by:   Kern Reap CMA (AAMA) on 01/22/2010   Method used:   Electronically to        [facility] [id number]* (retail)       [address] [place], [place]  [address]       Ph: [phone]       Fax: [phone]   RxID:   [id number]

## 2010-09-03 NOTE — Assessment & Plan Note (Signed)
Summary: SIDE PAIN/DIARRHEA/NJR   Vital Signs:  Patient Profile:   65 Years Old Female Height:     66 inches (167.64 cm) Weight:      207 pounds Temp:     98.3 degrees F oral BP sitting:   110 / 78  (left arm) Cuff size:   regular  Vitals Entered By: Kern Reap CMA (September 17, 2008 12:36 PM)                 Chief Complaint:  right side pain.  History of Present Illness: [name] is a 65 year old female comes in today with a 4-day history of fever, chills, abdominal cramps, and diarrhea.  She's had no vomiting.  No urinary tract symptoms.  She's otherwise been well.  She states she was around some sick children.  Last week.  Review of systems negative    Updated Prior Medication List: KLOR-CON M20 20 MEQ  TBCR (POTASSIUM CHLORIDE CRYS CR) Take 1 capsule by mouth once a day SYNTHROID 50 MCG  TABS (LEVOTHYROXINE SODIUM) Take 1 tablet by mouth once a day ZOCOR 20 MG  TABS (SIMVASTATIN) 1 qhs TENORETIC 50 50-25 MG  TABS (ATENOLOL-CHLORTHALIDONE) take one tablet daily FLEXERIL 10 MG  TABS (CYCLOBENZAPRINE HCL) one 3xday PROTONIX 40 MG  TBEC (PANTOPRAZOLE SODIUM) Take 1 tablet by mouth every morning  Current Allergies (reviewed today): ! ASA CODEINE  Past Medical History:    Reviewed history from 11/16/2007 and no changes required:       Allergic rhinitis       GERD       Hypertension       Hypothyroidism       Osteoarthritis       TA       COSTOCHONDRITIS       TKR 09       childbirth x 3       TAH       redo total knee, right, February 2009   Social History:    Reviewed history from 10/12/2007 and no changes required:       Single       Never Smoked       Alcohol use-no       Drug use-no       Regular exercise-yes    Review of Systems      See HPI   Physical Exam  General:     Well-developed,well-nourished,in no acute distress; alert,appropriate and cooperative throughout examination Abdomen:     the abdomen appears normal.  Bowel sounds are  normal.  There is no tenderness.  No rebound    Impression & Recommendations:  Problem # 1:  DIARRHEA (ICD-787.91) Assessment: New  Complete Medication List: 1)  Klor-con M20 20 Meq Tbcr (Potassium chloride crys cr) .... Take 1 capsule by mouth once a day 2)  Synthroid 50 Mcg Tabs (Levothyroxine sodium) .... Take 1 tablet by mouth once a day 3)  Zocor 20 Mg Tabs (Simvastatin) .[name] Kitchen.. 1 qhs 4)  Tenoretic 50 50-25 Mg Tabs (Atenolol-chlorthalidone) .... Take one tablet daily 5)  Flexeril 10 Mg Tabs (Cyclobenzaprine hcl) .... One 3xday 6)  Protonix 40 Mg Tbec (Pantoprazole sodium) .... Take 1 tablet by mouth every morning   Patient Instructions: 1)  take Tylenol for fever and chills, stay on a clear liquid diet until the symptoms have stopped. 2)  Please schedule a follow-up appointment as needed.

## 2010-09-03 NOTE — Assessment & Plan Note (Signed)
Summary: SINUS ISSUES // RS   Vital Signs:  Patient profile:   65 year old female Height:      66 inches Weight:      224 pounds BMI:     36.29 Temp:     98.9 degrees F oral BP sitting:   120 / 80  (left arm) Cuff size:   regular  Vitals Entered By: Kern Reap CMA Duncan Dull) (August 19, 2009 2:40 PM)  Reason for Visit sinus pressure and congestion  Primary Care Provider:  Governor Specking, MD   History of Present Illness: [name] is a 65 year old, married female, nonsmoker, who comes in today with a 3-day history of head congestion, sore throat, nonproductive cough.  She is afebrile today.  Yesterday, had a temp of 101, with chills and muscle aches.  Review of systems negative  Allergies: 1)  ! Asa 2)  Codeine  Past History:  Past Surgical History: HYSTERECTOMY R TKR HNP Nephrectomy  10.[name] KitchenRCC  Review of Systems       Flu Vaccine Consent Questions     Do you have a history of severe allergic reactions to this vaccine? no    Any prior history of allergic reactions to egg and/or gelatin? no    Do you have a sensitivity to the preservative Thimersol? no    Do you have a past history of Guillan-Barre Syndrome? no    Do you currently have an acute febrile illness? no    Have you ever had a severe reaction to latex? no    Vaccine information given and explained to patient? yes    Are you currently pregnant? no    Lot Number:[id number]   Exp Date:01/29/2010   Site Given  Left Deltoid IM   Physical Exam  General:  Well-developed,well-nourished,in no acute distress; alert,appropriate and cooperative throughout examination Head:  Normocephalic and atraumatic without obvious abnormalities. No apparent alopecia or balding. Eyes:  No corneal or conjunctival inflammation noted. EOMI. Perrla. Funduscopic exam benign, without hemorrhages, exudates or papilledema. Vision grossly normal. Ears:  External ear exam shows no significant lesions or deformities.  Otoscopic examination  reveals clear canals, tympanic membranes are intact bilaterally without bulging, retraction, inflammation or discharge. Hearing is grossly normal bilaterally. Nose:  External nasal examination shows no deformity or inflammation. Nasal mucosa are pink and moist without lesions or exudates. Mouth:  Oral mucosa and oropharynx without lesions or exudates.  Teeth in good repair. Neck:  No deformities, masses, or tenderness noted. Chest Wall:  No deformities, masses, or tenderness noted. Breasts:  No mass, nodules, thickening, tenderness, bulging, retraction, inflamation, nipple discharge or skin changes noted.   Lungs:  Normal respiratory effort, chest expands symmetrically. Lungs are clear to auscultation, no crackles or wheezes.   Problems:  Medical Problems Added: 1)  Dx of Viral Infection-unspec  (ICD-079.99)  Impression & Recommendations:  Problem # 1:  VIRAL INFECTION-UNSPEC (ICD-079.99) Assessment New  Her updated medication list for this problem includes:    Mobic 7.5 Mg Tabs (Meloxicam) .[name] Kitchen... Take one tab once daily    Hydromet 5-1.5 Mg/48ml Syrp (Hydrocodone-homatropine) .[name] Kitchen... 1 or 2 tsps  as needed  three times a day  Complete Medication List: 1)  Klor-con M20 20 Meq Tbcr (Potassium chloride crys cr) .... Take 1 capsule by mouth once a day 2)  Synthroid 50 Mcg Tabs (Levothyroxine sodium) .... Take 1 tablet by mouth once a day 3)  Zocor 20 Mg Tabs (Simvastatin) .[name] Kitchen.. 1 qhs 4)  Tenoretic 50 50-25 Mg Tabs (  Atenolol-chlorthalidone) .... Take one tablet daily 5)  Flexeril 10 Mg Tabs (Cyclobenzaprine hcl) .... Take 1 tab three times a day as needed 6)  Prednisone 20 Mg Tabs (Prednisone) .... Uad 7)  Vicodin Es 7.5-750 Mg Tabs (Hydrocodone-acetaminophen) .[name] Kitchen.. 1 tab @ bedtime 8)  Ultram 50 Mg Tabs (Tramadol hcl) .... Take 1 tab every 6-8 hours for pain 9)  Vitamin D (ergocalciferol) 50000 Unit Caps (Ergocalciferol) .... Take one tab every week 10)  Mobic 7.5 Mg Tabs (Meloxicam) .... Take  one tab once daily 11)  Hydromet 5-1.5 Mg/93ml Syrp (Hydrocodone-homatropine) .[name] Kitchen.. 1 or 2 tsps  as needed  three times a day  Other Orders: Flu Vaccine 35yrs + (21308) Administration Flu vaccine - MCR (M5784)  Patient Instructions: 1)  drank 30 ounces of water daily, also run a vaporizer or humidifier in her bedroom at night. 2)  He may take one or 2 teaspoons of Hydromet, up to 3 times a day as needed for cough and cold 3)  Please schedule a follow-up appointment as needed. Prescriptions: HYDROMET 5-1.5 MG/5ML SYRP (HYDROCODONE-HOMATROPINE) 1 or 2 tsps  as needed  three times a day  #8oz x 1   Entered and Authorized by:   Roderick Pee MD   Signed by:   Roderick Pee MD on 08/19/2009   Method used:   Print then Give to Patient   RxID:   [id number]

## 2010-09-03 NOTE — Assessment & Plan Note (Signed)
Summary: [name], Dr Tawanna Cooler requested pt be seen/pp   History of Present Illness Visit Type: Follow-up Visit Primary GI MD: Melvia Heaps MD [facility] Primary Provider: Governor Specking, MD Chief Complaint: adbominal pain History of Present Illness:   65 YO FEMALE KNOWN TO DR [name] FROM COLONOSCOPY 11/08 WITH SEVERAL POLYPS (HYPERPLASTIC). SHE ALSO HAS HX OF GASTRITIS AND ESOPHAGEAL STRICTURE DILATED 3/09. SHE COMES IN TODAY AS AN ADD ON WITH C/O PROGRESSIVE ABDOMINAL PAIN OVER THE PAST COUPLE WEEKS. INITIALLY HER PAIN WAS INTERMITTENT, NOW CONSTANT, WAKING HER AT NIGHT AT TIMES. SHE DESCRIBES IT AS PRESSURE FULLNESS, GASSY LOWER ABDOMINAL PAIN. PRIMARILY RIGHT SIDED AT FIRST NOW MORE DIFFUSE. NO FEVER, + CHILLS. NO N/V, APPETITIE DECREASED. SHE HURTS WORSE WITH ANY BENDING. BMS NORMAL FORMED STOOL,A LITTLE DARK,NO HEME. HAVING ONE BM DAILY, GENERALLY HAS 2-3 DAILY. SHE IS S/P HYSTERECTOMY, NO OTHER ABD. SURGERY. DENIES DYSURIA, FREQUENCY.   GI Review of Systems    Reports abdominal pain, bloating, and  nausea.     Location of  Abdominal pain: RLQ.    Denies acid reflux, belching, chest pain, dysphagia with liquids, dysphagia with solids, heartburn, loss of appetite, vomiting, vomiting blood, weight loss, and  weight gain.      Reports black tarry stools and  constipation.     Denies anal fissure, change in bowel habit, diarrhea, diverticulosis, fecal incontinence, heme positive stool, hemorrhoids, irritable bowel syndrome, jaundice, light color stool, liver problems, rectal bleeding, and  rectal pain.   Current Medications (verified): 1)  Klor-Con M20 20 Meq  Tbcr (Potassium Chloride Crys Cr) .... Take 1 Capsule By Mouth Once A Day 2)  Synthroid 50 Mcg  Tabs (Levothyroxine Sodium) .... Take 1 Tablet By Mouth Once A Day 3)  Zocor 20 Mg  Tabs (Simvastatin) .[name] Kitchen.. 1 Qhs 4)  Tenoretic 50 50-25 Mg  Tabs (Atenolol-Chlorthalidone) .... Take One Tablet Daily 5)  Flexeril 10 Mg  Tabs  (Cyclobenzaprine Hcl) .... Take 1 Tab Three Times A Day As Needed 6)  Prednisone 20 Mg Tabs (Prednisone) .... Uad 7)  Vicodin Es 7.5-750 Mg Tabs (Hydrocodone-Acetaminophen) .[name] Kitchen.. 1 Tab @ Bedtime  Allergies (verified): 1)  ! Asa 2)  Codeine  Past History:  Family History:    her mother had rheumatoid arthritis.    Family History of Arthritis     (02/27/2007)  Social History:    Single    Never Smoked    Alcohol use-no    Drug use-no    Regular exercise-yes     (10/12/2007)  Risk Factors:    Alcohol Use: N/A    >5 drinks/d w/in last 3 months: N/A    Caffeine Use: N/A    Diet: N/A    Exercise: yes (10/12/2007)  Past Medical History:    Reviewed history from 11/16/2007 and no changes required:    Allergic rhinitis    GERD    Hypertension    Hypothyroidism    Osteoarthritis    TA    COSTOCHONDRITIS    TKR 09    childbirth x 3    TAH    redo total knee, right, February 2009  Past Surgical History:    HYSTERECTOMY    R TKR    HNP  Review of Systems       The patient complains of allergy/sinus, anxiety-new, arthritis/joint pain, cough, depression-new, fatigue, muscle pains/cramps, and night sweats.         ROS OTHERWISE AS IN HPI  Vital Signs:  Patient  profile:   65 year old female Height:      66 inches Weight:      212.38 pounds BMI:     34.40 Pulse rate:   68 / minute Pulse rhythm:   regular BP sitting:   110 / 78  (left arm)  Vitals Entered By: June McMurray CMA (November 14, 2008 2:12 PM)  Physical Exam  General:  Well developed, well nourished, no acute distress. Head:  Normocephalic and atraumatic. Eyes:  PERRLA, no icterus. Neck:  Supple; no masses or thyromegaly. Lungs:  Clear throughout to auscultation. Heart:  Regular rate and rhythm; no murmurs, rubs,  or bruits. Abdomen:  SOFT, MILD RATHER DIFFUSE TENDERNESS, NO MESS OR HSM, NO GUARDING BS+ Rectal:  HEME NEGATIVE. Neurologic:  Alert and  oriented x4;  grossly normal neurologically.    Impression & Recommendations:  Problem # 1:  ABDOMINAL PAIN -GENERALIZED (ICD-789.07) Assessment New 65 YO WITH LOWER ABDOMINAL PAIN  X 3 WEEKS, PROGRESSIVE - ETIOLOGY UNCLEAR ULTRAM 50 MG Q 6 HOURS AS NEEDED FURTHER PLANS PENDING RESULTS OF ABOVE.  Orders: CT Abdomen/Pelvis with Contrast (CT Abd/Pelvis w/con)  Problem # 2:  PERSONAL HX COLONIC POLYPS (ICD-V12.72) Assessment: Comment Only HYPERPLASTIC, DUE FOR FOLLOW UP 2013  Problem # 3:  ESOPHAGEAL STRICTURE (ICD-530.3) Assessment: Comment Only HX, NO CURRENT SXS.  Other Orders: TLB-CBC Platelet - w/Differential (85025-CBCD) TLB-CMP (Comprehensive Metabolic Pnl) (80053-COMP) TLB-Lipase (83690-LIPASE)  Patient Instructions: 1)  Please go to our lab. 2)  We have given you a prescription for Ultram for pain.  You will have to take this written prescription to the pharmacy. 3)  We have scheduled a CT Scan for 11-15-08 @ 3:30 PM. Instructions and contrast provided. Location is [address] 4)  Copy sent to : Kelle Darting, MD 5)  The medication list was reviewed and reconciled.  All changed / newly prescribed medications were explained.  A complete medication list was provided to the patient / caregiver.  Prescriptions: ULTRAM 50 MG TABS (TRAMADOL HCL) Take 1 tab every 6-8 hours for pain  #25 x 0   Entered by:   Lowry Ram CMA   Authorized by:   Sammuel Cooper PA-c   Signed by:   Lowry Ram CMA on 11/14/2008   Method used:   Printed then faxed to ...       [facility] [address]  [phone]* (retail)       [address]       [place], [place]  [address]       Ph: [phone] or [phone]       Fax: [phone]   RxID:   [id number]

## 2010-09-03 NOTE — Miscellaneous (Signed)
Summary: rx update  Medications Added EFFER-K 20 MEQ TBEF (POTASSIUM BICARB-CITRIC ACID) one tab once daily       Clinical Lists Changes  Medications: Added new medication of EFFER-K 20 MEQ TBEF (POTASSIUM BICARB-CITRIC ACID) one tab once daily - Signed Rx of EFFER-K 20 MEQ TBEF (POTASSIUM BICARB-CITRIC ACID) one tab once daily;  #100 x 3;  Signed;  Entered by: Kern Reap CMA (AAMA);  Authorized by: Roderick Pee MD;  Method used: Electronically to [id number]*, [address], [place], [place], [place]  [address], Ph: [phone], Fax: [phone]    Prescriptions: EFFER-K 20 MEQ TBEF (POTASSIUM BICARB-CITRIC ACID) one tab once daily  #100 x 3   Entered by:   Kern Reap CMA (AAMA)   Authorized by:   Roderick Pee MD   Signed by:   Kern Reap CMA (AAMA) on 11/27/2009   Method used:   Electronically to        [facility] #[id number]* (retail)       [address]       [place], [place]  [address]       Ph: [phone]       Fax: [phone]   RxID:   [id number]

## 2010-09-03 NOTE — Progress Notes (Signed)
Summary: rx refills  Phone Note Call from Patient Call back at [phone]   Caller: patient live Call For: Tawanna Cooler Summary of Call: patient needs rx refill on her meds Dr Tawanna Cooler perscribed for her in 11-16-07.  [facility] has told her she has not refill.  [facility] [address].  Please call when called in.  Does not need the vicodin. Initial call taken by: Celine Ahr,  Dec 29, 2007 2:06 PM      Prescriptions: VICODIN ES 7.5-750 MG  TABS (HYDROCODONE-ACETAMINOPHEN) one 3x day  #30 x 1   Entered by:   Kern Reap CMA   Authorized by:   Roderick Pee MD   Signed by:   Kern Reap CMA on 01/03/2008   Method used:   Historical   RxID:   [id number] PROTONIX 40 MG  TBEC (PANTOPRAZOLE SODIUM) Take 1 tablet by mouth every morning  #100 x 4   Entered by:   Kern Reap CMA   Authorized by:   Roderick Pee MD   Signed by:   Kern Reap CMA on 01/02/2008   Method used:   Electronically sent to ...       [id number]*       [address]       [place], [place]  [address]       Ph: [phone]       Fax: [phone]   RxID:   [id number] VICODIN ES 7.5-750 MG  TABS (HYDROCODONE-ACETAMINOPHEN) one 3x day  #30 x 1   Entered by:   Kern Reap CMA   Authorized by:   Roderick Pee MD   Signed by:   Kern Reap CMA on 01/02/2008   Method used:   Electronically sent to ...       [id number]*       [address]       [place], [place]  [address]       Ph: [phone]       Fax: [phone]   RxID:   [id number] FLEXERIL 10 MG  TABS (CYCLOBENZAPRINE HCL) one 3xday  #30 x 1   Entered by:   Kern Reap CMA   Authorized by:   Roderick Pee MD   Signed by:   Kern Reap CMA on 01/02/2008   Method used:   Electronically sent to ...       [id number]*       [address]       [place], [place]  [address]       Ph: [phone]       Fax: [phone]   RxID:   [id number]  TENORETIC 50 50-25 MG  TABS (ATENOLOL-CHLORTHALIDONE) take one tablet daily  #100 x 4   Entered by:   Kern Reap CMA   Authorized by:   Roderick Pee MD   Signed by:   Kern Reap CMA on 01/02/2008   Method used:   Electronically sent to ...       [id number]*       [address]       [place], [place]  [address]       Ph: [phone]       Fax: [phone]   RxID:   [id number] ZOCOR 20 MG  TABS (SIMVASTATIN) 1 qhs  #100 Each x 4   Entered by:   Kern Reap CMA  Authorized by:   Roderick Pee MD   Signed by:   Kern Reap CMA on 01/02/2008   Method used:   Electronically sent to ...       [id number]*       [address]       [place], [place]  [address]       Ph: [phone]       Fax: [phone]   RxID:   [id number] DICLOFENAC SODIUM 75 MG  TBEC (DICLOFENAC SODIUM) 2 tabs bid  #100 x 0   Entered by:   Kern Reap CMA   Authorized by:   Roderick Pee MD   Signed by:   Kern Reap CMA on 01/02/2008   Method used:   Electronically sent to ...       [id number]*       [address]       [place], [place]  [address]       Ph: [phone]       Fax: [phone]   RxID:   [id number] SYNTHROID 50 MCG  TABS (LEVOTHYROXINE SODIUM) Take 1 tablet by mouth once a day  #100 x 4   Entered by:   Kern Reap CMA   Authorized by:   Roderick Pee MD   Signed by:   Kern Reap CMA on 01/02/2008   Method used:   Electronically sent to ...       [id number]*       [address]       [place], [place]  [address]       Ph: [phone]       Fax: [phone]   RxID:   [id number] KLOR-CON M20 20 MEQ  TBCR (POTASSIUM CHLORIDE CRYS CR) Take 1 capsule by mouth once a day  #100 x 4   Entered by:   Kern Reap CMA   Authorized by:   Roderick Pee MD   Signed by:   Kern Reap CMA on 01/02/2008   Method used:   Electronically sent to ...       [id number]*       [address]       [place], [place]  [address]       Ph: [phone]       Fax: [phone]   RxID:   [id number]

## 2010-09-03 NOTE — Procedures (Signed)
Summary: egd   egd   Imported By: Kassie Mends 11/03/2007 08:12:16  _____________________________________________________________________  External Attachment:    Type:   Image     Comment:   egd

## 2010-09-03 NOTE — Assessment & Plan Note (Signed)
Summary: right side pain/shoulder/pt will go to labs bmp/a1c/njr   Vital Signs:  Patient profile:   65 year old female Weight:      219 pounds BMI:     36.02 Temp:     98.1 degrees F oral BP sitting:   110 / 78  (left arm) Cuff size:   regular  Vitals Entered By: Kern Reap CMA Duncan Dull) (June 03, 2010 9:06 AM) CC: left side pain, head congestion   Primary Care Provider:  Governor Specking, MD  CC:  left side pain and head congestion.  History of Present Illness: [name] is a 65 year old female, who comes in today for evaluation of soreness on the right side of her chest x 1 week after sustaining a fall at home and she wants a flu shot review of systems negative.  No shortness of breath, etc.  Allergies: 1)  ! Asa 2)  Codeine  Past History:  Past medical, surgical, family and social histories (including risk factors) reviewed for relevance to current acute and chronic problems.  Past Medical History: Reviewed history from 11/16/2007 and no changes required. Allergic rhinitis GERD Hypertension Hypothyroidism Osteoarthritis TA COSTOCHONDRITIS TKR 09 childbirth x 3 TAH redo total knee, right, February 2009  Past Surgical History: Reviewed history from 08/19/2009 and no changes required. HYSTERECTOMY R TKR HNP Nephrectomy  [name]  Family History: Reviewed history from 01/22/2010 and no changes required. her mother had rheumatoid arthritis. Family History of Arthritis Family History Diabetes 1st degree relative  Social History: Reviewed history from 04/10/2009 and no changes required. Single Never Smoked Alcohol use-no Drug use-no Regular exercise-yes Former Smoker   6./10  Review of Systems      See HPI       Flu Vaccine Consent Questions     Do you have a history of severe allergic reactions to this vaccine? no    Any prior history of allergic reactions to egg and/or gelatin? no    Do you have a sensitivity to the preservative Thimersol? no    Do  you have a past history of Guillan-Barre Syndrome? no    Do you currently have an acute febrile illness? no    Have you ever had a severe reaction to latex? no    Vaccine information given and explained to patient? yes    Are you currently pregnant? no    Lot Number:[id number]   Exp Date:01/30/2011   Site Given  Left Deltoid IM   Physical Exam  General:  Well-developed,well-nourished,in no acute distress; alert,appropriate and cooperative throughout examination Chest Wall:  No deformities, masses, or tenderness noted. Lungs:  Normal respiratory effort, chest expands symmetrically. Lungs are clear to auscultation, no crackles or wheezes.   Problems:  Medical Problems Added: 1)  Dx of Chest Pain  (ICD-786.50)  Impression & Recommendations:  Problem # 1:  CHEST PAIN (ICD-786.50) Assessment New  Complete Medication List: 1)  Klor-con M20 20 Meq Tbcr (Potassium chloride crys cr) .... Take 1 capsule by mouth once a day 2)  Synthroid 50 Mcg Tabs (Levothyroxine sodium) .... Take 1 tablet by mouth once a day 3)  Zocor 20 Mg Tabs (Simvastatin) .[name] Kitchen.. 1 qhs 4)  Tenoretic 50 50-25 Mg Tabs (Atenolol-chlorthalidone) .... Take one tablet daily 5)  Pantoprazole Sodium 40 Mg Tbec (Pantoprazole sodium) .... Take one tab by mouth once daily 6)  Effer-k 20 Meq Tbef (Potassium bicarb-citric acid) .... One tab once daily 7)  Allopurinol 300 Mg Tabs (Allopurinol) .... Take one tab by  mouth once daily 8)  Accu-chek Aviva Strp (Glucose blood) .... Use once daily for glucose control 9)  Lancets Misc (Lancets) .... Use once daily for for glucose control 10)  Metformin Hcl 500 Mg Tabs (Metformin hcl) .... 1/2 qam  Other Orders: Venipuncture ([address]) Specimen Handling (60454) Flu Vaccine 69yrs + MEDICARE PATIENTS (U9811) Administration Flu vaccine - MCR (G0008) TLB-BMP (Basic Metabolic Panel-BMET) (80048-METABOL) TLB-A1C / Hgb A1C (Glycohemoglobin) (83036-A1C)  Patient Instructions: 1)  take Motrin,  600 mg twice daily with food.  Return p.r.n.   Orders Added: 1)  Venipuncture [91478] 2)  Specimen Handling [99000] 3)  Est. Patient Level III [[address]562] 4)  Flu Vaccine 57yrs + MEDICARE PATIENTS [Q2039] 5)  Administration Flu vaccine - MCR [G0008] 6)  TLB-BMP (Basic Metabolic Panel-BMET) [80048-METABOL] 7)  TLB-A1C / Hgb A1C (Glycohemoglobin) [83036-A1C]

## 2010-09-03 NOTE — Assessment & Plan Note (Signed)
Summary: sinus infection/mhf    Chief Complaint:  sinus infection.  History of Present Illness: Graylyn is a 65 year old female, who comes in today for evaluation of two problems.  She has underlying allergic rhinitis treated with Claritin 10 mg daily.  However, in the last couple weeks, does not work.  She's having a lot of head congestion, sneezing, et ever.  Her most common allergen is [place] rod.  She's also complaining of right lower quadrant and right upper quadrant abdominal pain.  She had this problem.  One week ago.  She stopped the diclofenac and her pain went away.  She then restarted the medication and now the pain is back.  She's had no vomiting, diarrhea, no urinary tract symptoms.  Bowel much of the normal    Current Allergies (reviewed today): ! ASA CODEINE  Past Medical History:    Reviewed history from 11/16/2007 and no changes required:       Allergic rhinitis       GERD       Hypertension       Hypothyroidism       Osteoarthritis       TA       COSTOCHONDRITIS       TKR 09       childbirth x 3       TAH       redo total knee, right, February 2009   Social History:    Reviewed history from 10/12/2007 and no changes required:       Single       Never Smoked       Alcohol use-no       Drug use-no       Regular exercise-yes     Physical Exam  General:     Well-developed,well-nourished,in no acute distress; alert,appropriate and cooperative throughout examination Head:     Normocephalic and atraumatic without obvious abnormalities. No apparent alopecia or balding. Eyes:     No corneal or conjunctival inflammation noted. EOMI. Perrla. Funduscopic exam benign, without hemorrhages, exudates or papilledema. Vision grossly normal. Ears:     External ear exam shows no significant lesions or deformities.  Otoscopic examination reveals clear canals, tympanic membranes are intact bilaterally without bulging, retraction, inflammation or discharge. Hearing is  grossly normal bilaterally. Nose:     External nasal examination shows no deformity or inflammation. Nasal mucosa are pink and moist without lesions or exudates. Mouth:     Oral mucosa and oropharynx without lesions or exudates.  Teeth in good repair. Neck:     No deformities, masses, or tenderness noted. Abdomen:     Bowel sounds positive,abdomen soft and non-tender without masses, organomegaly or hernias noted.     Patient Instructions: 1)  stop the Claritinswitch to Zyrtec 10 mg at bedtime, along with one shot of the steroid nasal spray up each nostril at bedtime.    Impression & Recommendations:  Problem # 1:  ALLERGIC RHINITIS (ICD-477.9) Assessment: Deteriorated          Flu Vaccine Consent Questions     Do you have a history of severe allergic reactions to this vaccine? no    Any prior history of allergic reactions to egg and/or gelatin? no    Do you have a sensitivity to the preservative Thimersol? no    Do you have a past history of Guillan-Barre Syndrome? no    Do you currently have an acute febrile illness? no    Have you ever had  a severe reaction to latex? no    Vaccine information given and explained to patient? yes    Are you currently pregnant? no    Lot Number:[id number]   Site Given  Left Deltoid IM  Her updated medication list for this problem includes:    Flonase 50 Mcg/act Susp (Fluticasone propionate) ..... One shor r&l at bedtime   Problem # 2:  ABDOMINAL PAIN, EPIGASTRIC (ICD-789.06) Assessment: Deteriorated  Complete Medication List: 1)  Klor-con M20 20 Meq Tbcr (Potassium chloride crys cr) .... Take 1 capsule by mouth once a day 2)  Synthroid 50 Mcg Tabs (Levothyroxine sodium) .... Take 1 tablet by mouth once a day 3)  Diclofenac Sodium 75 Mg Tbec (Diclofenac sodium) .... Two times a day 4)  Zocor 20 Mg Tabs (Simvastatin) .[name] Kitchen.. 1 qhs 5)  Tenoretic 50 50-25 Mg Tabs (Atenolol-chlorthalidone) .... Take one tablet daily 6)  Flexeril 10 Mg  Tabs (Cyclobenzaprine hcl) .... One 3xday 7)  Protonix 40 Mg Tbec (Pantoprazole sodium) .... Take 1 tablet by mouth every morning 8)  Flonase 50 Mcg/act Susp (Fluticasone propionate) .... One shor r&l at bedtime  Other Orders: Admin 1st Vaccine (04540) Flu Vaccine 22yrs + (98119)  Prescriptions: FLONASE 50 MCG/ACT SUSP (FLUTICASONE PROPIONATE) one shor R&L at bedtime  #3 x 3   Entered and Authorized by:   Roderick Pee MD   Signed by:   Roderick Pee MD on 05/09/2008   Method used:   Electronically to        [facility]  [phone]* (retail)       [address]       [place], [place]  [address]       Ph: [phone] or [phone]       Fax: [phone]   RxID:   [id number]  ]

## 2010-09-03 NOTE — Miscellaneous (Signed)
Summary: mammogram update   Clinical Lists Changes  Observations: Added new observation of MAMMOGRAM: normal (04/27/2010 12:32)      Preventive Care Screening  Mammogram:    Date:  04/27/2010    Results:  normal

## 2010-09-03 NOTE — Assessment & Plan Note (Signed)
Summary: feet/leg numbness/njr    Vital Signs:  Patient Profile:   65 Years Old Female Height:     66 inches (167.64 cm) Weight:      219 pounds                 Chief Complaint:  " LEGS AND FEET, NUMBNESS, PAIN , and & BURNING " X 1 WEEK.  History of Present Illness: he is a 65 year old female, who comes in today for evaluation of fatigue.  No energy, muscle pain joint pain, and a skin rash on her left cheek.  Since had the symptoms for a couple weeks.  She seen her rheumatologist I start her on some new medications.  She thinks it may be a side effect of the new medication.  Or it could be reaction to her underlying disease.  She called her rheumatologist to suggest she contin  Continue the medication.  She's here for a second opinion.  Review of systems is positive as was previously noted with her lupus and rheumatoid arthritis.  No new changes except for the skin rash on her left cheek.  She is not recall the name of the medication, rheumatologist gave her.  Current Allergies (reviewed today): ! ASA CODEINE     Review of Systems      See HPI   Physical Exam  General:     Well-developed,well-nourished,in no acute distress; alert,appropriate and cooperative throughout examination    Impression & Recommendations:  Problem # 1:  ARTHRITIS, RHEUMATOID (ICD-714.0)  Her updated medication list for this problem includes:    Diclofenac Sodium 75 Mg Tbec (Diclofenac sodium) .[name] Kitchen... 2 tabs bid    Prednisone 20 Mg Tabs (Prednisone) .[name] Kitchen... As needed    Prednisone 20 Mg Tabs (Prednisone) ..... Uad   Complete Medication List: 1)  Klor-con M20 20 Meq Tbcr (Potassium chloride crys cr) .... Take 1 capsule by mouth once a day 2)  Synthroid 50 Mcg Tabs (Levothyroxine sodium) .... Take 1 tablet by mouth once a day 3)  Diclofenac Sodium 75 Mg Tbec (Diclofenac sodium) .... 2 tabs bid 4)  Zocor 20 Mg Tabs (Simvastatin) .[name] Kitchen.. 1 qhs 5)  Tenoretic 50 50-25 Mg Tabs  (Atenolol-chlorthalidone) .... 1/2 qam 6)  Prednisone 20 Mg Tabs (Prednisone) .... As needed 7)  Vicodin Es 7.5-750 Mg Tabs (Hydrocodone-acetaminophen) .... Prn 8)  Flexeril 10 Mg Tabs (Cyclobenzaprine hcl) .... 1/2 tid prn 9)  Prednisone 20 Mg Tabs (Prednisone) .... Uad 10)  Flexeril 10 Mg Tabs (Cyclobenzaprine hcl) .... One 3xday 11)  Vicodin Es 7.5-750 Mg Tabs (Hydrocodone-acetaminophen) .... One 3x day   Patient Instructions: 1)  I would concur with the rheumatologist that the symptoms are most likely related to underlying disease. It is always possible.  Could be a medication side effect.  I would call your rheumatologist, and work with her about this issue.    ]  Appended Document: Orders Update     Clinical Lists Changes  Orders: Added new Service order of Est. Patient Level III ([address]) - Signed

## 2010-10-16 ENCOUNTER — Ambulatory Visit (INDEPENDENT_AMBULATORY_CARE_PROVIDER_SITE_OTHER): Payer: MEDICARE | Admitting: Family Medicine

## 2010-10-16 ENCOUNTER — Encounter: Payer: Self-pay | Admitting: Family Medicine

## 2010-10-16 ENCOUNTER — Ambulatory Visit (INDEPENDENT_AMBULATORY_CARE_PROVIDER_SITE_OTHER)
Admission: RE | Admit: 2010-10-16 | Discharge: 2010-10-16 | Disposition: A | Discharge status: Home / Self Care | Payer: MEDICARE | Source: Ambulatory Visit | Attending: Family Medicine | Admitting: Family Medicine

## 2010-10-16 VITALS — BP 140/80 | Temp 98.4°F | Wt 214.0 lb

## 2010-10-16 DIAGNOSIS — R05 Cough: Secondary | ICD-10-CM

## 2010-10-16 DIAGNOSIS — R059 Cough, unspecified: Secondary | ICD-10-CM

## 2010-10-16 DIAGNOSIS — R509 Fever, unspecified: Secondary | ICD-10-CM

## 2010-10-16 MED ORDER — LEVOFLOXACIN 500 MG PO TABS: 500.0000 mg | ORAL_TABLET | Freq: Every day | ORAL | Status: AC

## 2010-10-16 NOTE — Progress Notes (Signed)
  Subjective:    Patient ID: [name], female    DOB: November 22, 1945, 65 y.o.   MRN: [id number]  HPI  Patient seen with respiratory illness. Relates onset about 2 weeks ago of cold like symptoms. Seemed to be clearing but a few days ago developed fever 101 with body aches and increased cough. Cough mostly nonproductive. No dyspnea. Denies sore throat, nausea, vomiting, or diarrhea. She is a former smoker.   Review of Systems  Constitutional: Positive for fever, chills, activity change and fatigue.  HENT: Negative for sore throat and neck stiffness.   Respiratory: Positive for cough. Negative for chest tightness and shortness of breath.   Cardiovascular: Negative for chest pain.  Genitourinary: Negative for dysuria.  Skin: Negative for rash.  Hematological: Negative for adenopathy.       Objective:   Physical Exam  patient is alert and in no respiratory distress. Afebrile at this time Oropharynx moist and clear Eardrums no acute changes Neck supple no adenopathy Chest bibasilar rales. No wheezes. No retractions.  Pulse oximetry is 97% room air. Heart regular rhythm and rate Extremities no edema Skin no rash.       Assessment & Plan:   Cough and fever in a patient with concern for possible bibasilar pneumonia , community acquired. Obtain chest x-ray. Levaquin 500 mg daily for 10 day She is in no respiratory distress and not hypoxic.

## 2010-10-17 ENCOUNTER — Encounter: Payer: Self-pay | Admitting: Family Medicine

## 2010-10-19 NOTE — Progress Notes (Signed)
Quick Note:  Pt informed ______ 

## 2010-10-27 ENCOUNTER — Ambulatory Visit (INDEPENDENT_AMBULATORY_CARE_PROVIDER_SITE_OTHER): Payer: MEDICARE | Admitting: Family Medicine

## 2010-10-27 ENCOUNTER — Encounter: Payer: Self-pay | Admitting: Family Medicine

## 2010-10-27 VITALS — BP 130/90 | Temp 98.3°F | Wt 195.0 lb

## 2010-10-27 DIAGNOSIS — R059 Cough, unspecified: Secondary | ICD-10-CM

## 2010-10-27 DIAGNOSIS — R05 Cough: Secondary | ICD-10-CM

## 2010-10-27 MED ORDER — PREDNISONE 20 MG PO TABS: ORAL_TABLET | ORAL | Status: DC

## 2010-10-27 NOTE — Patient Instructions (Signed)
Prednisone two tabs x 3 days, one x 3 days, a half x 3 days, then half a tablet Monday, Wednesday, Friday, for a two-week taper.  Return p.r.n.

## 2010-10-27 NOTE — Progress Notes (Signed)
  Subjective:    Patient ID: [name], female    DOB: April 19, 1946, 65 y.o.   MRN: [id number]  HPI Braelee is a delightful, 65 year old, married female, nonsmoker, who comes in today for evaluation of a cough.  She was seen two weeks ago with severe coughing.  At that time.  Her exam shows bilateral crackles.  Chest x-ray was normal.  She was given Levaquin, but it didn't help.  She continues to cough.  No sputum production.  No fever.  She also has had congestion, postnasal drip, and a flareup of her springtime allergic rhinitis.  She does not feel like she is wheezing   Review of Systems    General and pulmonary review of systems otherwise negative Objective:   Physical Exam    Well-developed well-nourished, female, in no acute distress.  HEENT negative except for a lot of postnasal drip, septum in the midline, 4+ nasal edema.  Neck was clear.  No adenopathy.  Lung exam normal.  No wheezing    Assessment & Plan:  Allergic rhinitis, unresponsive to conservative therapy,,,,,,,,,,,, prednisone burst and taper return p.r.n.

## 2010-11-04 LAB — BASIC METABOLIC PANEL
BUN: 20 mg/dL (ref 6–23)
CO2: 31 mEq/L (ref 19–32)
Calcium: 9.8 mg/dL (ref 8.4–10.5)
Chloride: 101 mEq/L (ref 96–112)
Creatinine, Ser: 1.1 mg/dL (ref 0.4–1.2)
GFR calc Af Amer: >60 mL/min (ref >60)
GFR calc non Af Amer: 50 mL/min — ABNORMAL LOW (ref >60)
Glucose, Bld: 119 mg/dL — ABNORMAL HIGH (ref 70–99)
Potassium: 3.2 mEq/L — ABNORMAL LOW (ref 3.5–5.1)
Sodium: 138 mEq/L (ref 135–145)

## 2010-11-04 LAB — POCT HEMOGLOBIN-HEMACUE: Hemoglobin: 14.2 g/dL (ref 12.0–15.0)

## 2010-11-09 LAB — CBC
HCT: 41.8 % (ref 36.0–46.0)
Hemoglobin: 14.1 g/dL (ref 12.0–15.0)
MCHC: 33.6 g/dL (ref 30.0–36.0)
MCV: 92.1 fL (ref 78.0–100.0)
Platelets: 194 10*3/uL (ref 150–400)
RBC: 4.54 MIL/uL (ref 3.87–5.11)
RDW: 14.4 % (ref 11.5–15.5)
WBC: 8.1 10*3/uL (ref 4.0–10.5)

## 2010-11-09 LAB — COMPREHENSIVE METABOLIC PANEL
ALT: 21 U/L (ref 0–35)
AST: 30 U/L (ref 0–37)
Albumin: 3.6 g/dL (ref 3.5–5.2)
Alkaline Phosphatase: 67 U/L (ref 39–117)
BUN: 12 mg/dL (ref 6–23)
CO2: 30 mEq/L (ref 19–32)
Calcium: 10.1 mg/dL (ref 8.4–10.5)
Chloride: 102 mEq/L (ref 96–112)
Creatinine, Ser: 0.73 mg/dL (ref 0.4–1.2)
GFR calc Af Amer: >60 mL/min (ref >60)
GFR calc non Af Amer: >60 mL/min (ref >60)
Glucose, Bld: 82 mg/dL (ref 70–99)
Potassium: 3.5 mEq/L (ref 3.5–5.1)
Sodium: 139 mEq/L (ref 135–145)
Total Bilirubin: 0.5 mg/dL (ref 0.3–1.2)
Total Protein: 7.8 g/dL (ref 6.0–8.3)

## 2010-11-09 LAB — PROTIME-INR
INR: 1 (ref 0.00–1.49)
Prothrombin Time: 13.3 seconds (ref 11.6–15.2)

## 2010-11-09 LAB — GLUCOSE, CAPILLARY: Glucose-Capillary: 87 mg/dL (ref 70–99)

## 2010-11-09 LAB — TYPE AND SCREEN
ABO/RH(D): A POS
Antibody Screen: POSITIVE
DAT, IgG: NEGATIVE

## 2010-11-09 LAB — APTT: aPTT: 27 seconds (ref 24–37)

## 2010-11-10 LAB — BASIC METABOLIC PANEL
BUN: 12 mg/dL (ref 6–23)
CO2: 25 mEq/L (ref 19–32)
Calcium: 9.8 mg/dL (ref 8.4–10.5)
Chloride: 102 mEq/L (ref 96–112)
Creatinine, Ser: 0.67 mg/dL (ref 0.4–1.2)
GFR calc Af Amer: >60 mL/min (ref >60)
GFR calc non Af Amer: >60 mL/min (ref >60)
Glucose, Bld: 114 mg/dL — ABNORMAL HIGH (ref 70–99)
Potassium: 3.2 mEq/L — ABNORMAL LOW (ref 3.5–5.1)
Sodium: 137 mEq/L (ref 135–145)

## 2010-11-10 LAB — CBC
HCT: 40.2 % (ref 36.0–46.0)
Hemoglobin: 14.1 g/dL (ref 12.0–15.0)
MCHC: 34.9 g/dL (ref 30.0–36.0)
MCV: 89.8 fL (ref 78.0–100.0)
Platelets: 180 10*3/uL (ref 150–400)
RBC: 4.48 MIL/uL (ref 3.87–5.11)
RDW: 14.1 % (ref 11.5–15.5)
WBC: 6.1 10*3/uL (ref 4.0–10.5)

## 2010-11-10 LAB — PROTIME-INR
INR: 1 (ref 0.00–1.49)
Prothrombin Time: 13.3 seconds (ref 11.6–15.2)

## 2010-11-10 LAB — APTT: aPTT: 29 seconds (ref 24–37)

## 2010-11-27 ENCOUNTER — Other Ambulatory Visit (INDEPENDENT_AMBULATORY_CARE_PROVIDER_SITE_OTHER): Payer: MEDICARE | Admitting: Family Medicine

## 2010-11-27 DIAGNOSIS — I10 Essential (primary) hypertension: Secondary | ICD-10-CM

## 2010-11-27 DIAGNOSIS — E785 Hyperlipidemia, unspecified: Secondary | ICD-10-CM

## 2010-11-27 DIAGNOSIS — E119 Type 2 diabetes mellitus without complications: Secondary | ICD-10-CM

## 2010-11-27 DIAGNOSIS — Z0000 Encounter for general adult medical examination without abnormal findings: Secondary | ICD-10-CM

## 2010-11-27 DIAGNOSIS — E039 Hypothyroidism, unspecified: Secondary | ICD-10-CM

## 2010-11-27 LAB — POCT URINALYSIS DIPSTICK
Bilirubin, UA: NEGATIVE
Blood, UA: NEGATIVE
Glucose, UA: NEGATIVE
Ketones, UA: NEGATIVE
Leukocytes, UA: NEGATIVE
Nitrite, UA: NEGATIVE
Protein, UA: NEGATIVE
Spec Grav, UA: 1.02
Urobilinogen, UA: 0.2
pH, UA: 6.5

## 2010-11-27 LAB — HEPATIC FUNCTION PANEL
ALT: 26 U/L (ref 0–35)
AST: 28 U/L (ref 0–37)
Albumin: 3.5 g/dL (ref 3.5–5.2)
Alkaline Phosphatase: 58 U/L (ref 39–117)
Bilirubin, Direct: 0 mg/dL (ref 0.0–0.3)
Total Bilirubin: 0.6 mg/dL (ref 0.3–1.2)
Total Protein: 7.2 g/dL (ref 6.0–8.3)

## 2010-11-27 LAB — BASIC METABOLIC PANEL
BUN: 17 mg/dL (ref 6–23)
CO2: 29 mEq/L (ref 19–32)
Calcium: 9.7 mg/dL (ref 8.4–10.5)
Chloride: 102 mEq/L (ref 96–112)
Creatinine, Ser: 1 mg/dL (ref 0.4–1.2)
GFR: 71.16 mL/min (ref >60.00)
Glucose, Bld: 96 mg/dL (ref 70–99)
Potassium: 3.3 mEq/L — ABNORMAL LOW (ref 3.5–5.1)
Sodium: 140 mEq/L (ref 135–145)

## 2010-11-27 LAB — CBC WITH DIFFERENTIAL/PLATELET
Basophils Absolute: 0 10*3/uL (ref 0.0–0.1)
Basophils Relative: 0.4 % (ref 0.0–3.0)
Eosinophils Absolute: 0.3 10*3/uL (ref 0.0–0.7)
Eosinophils Relative: 3.8 % (ref 0.0–5.0)
HCT: 38.9 % (ref 36.0–46.0)
Hemoglobin: 13.4 g/dL (ref 12.0–15.0)
Lymphocytes Relative: 44.3 % (ref 12.0–46.0)
Lymphs Abs: 3 10*3/uL (ref 0.7–4.0)
MCHC: 34.4 g/dL (ref 30.0–36.0)
MCV: 91 fl (ref 78.0–100.0)
Monocytes Absolute: 0.6 10*3/uL (ref 0.1–1.0)
Monocytes Relative: 9.4 % (ref 3.0–12.0)
Neutro Abs: 2.8 10*3/uL (ref 1.4–7.7)
Neutrophils Relative %: 42.1 % — ABNORMAL LOW (ref 43.0–77.0)
Platelets: 244 10*3/uL (ref 150.0–400.0)
RBC: 4.28 Mil/uL (ref 3.87–5.11)
RDW: 14.7 % — ABNORMAL HIGH (ref 11.5–14.6)
WBC: 6.8 10*3/uL (ref 4.5–10.5)

## 2010-11-27 LAB — HEMOGLOBIN A1C: Hgb A1c MFr Bld: 6.5 % (ref 4.6–6.5)

## 2010-11-27 LAB — LIPID PANEL
Cholesterol: 194 mg/dL (ref 0–200)
HDL: 58 mg/dL (ref >39.00)
LDL Cholesterol: 114 mg/dL — ABNORMAL HIGH (ref 0–99)
Total CHOL/HDL Ratio: 3
Triglycerides: 110 mg/dL (ref 0.0–149.0)
VLDL: 22 mg/dL (ref 0.0–40.0)

## 2010-11-27 LAB — MICROALBUMIN / CREATININE URINE RATIO
Creatinine,U: 123.3 mg/dL
Microalb Creat Ratio: 1.9 mg/g (ref 0.0–30.0)
Microalb, Ur: 2.4 mg/dL — ABNORMAL HIGH (ref 0.0–1.9)

## 2010-11-27 LAB — TSH: TSH: 1.47 u[IU]/mL (ref 0.35–5.50)

## 2010-12-02 ENCOUNTER — Ambulatory Visit (INDEPENDENT_AMBULATORY_CARE_PROVIDER_SITE_OTHER): Payer: MEDICARE | Admitting: Family Medicine

## 2010-12-02 ENCOUNTER — Other Ambulatory Visit: Payer: Self-pay | Admitting: *Deleted

## 2010-12-02 ENCOUNTER — Encounter: Payer: Self-pay | Admitting: Family Medicine

## 2010-12-02 DIAGNOSIS — I10 Essential (primary) hypertension: Secondary | ICD-10-CM

## 2010-12-02 DIAGNOSIS — E039 Hypothyroidism, unspecified: Secondary | ICD-10-CM

## 2010-12-02 DIAGNOSIS — E119 Type 2 diabetes mellitus without complications: Secondary | ICD-10-CM

## 2010-12-02 DIAGNOSIS — K219 Gastro-esophageal reflux disease without esophagitis: Secondary | ICD-10-CM

## 2010-12-02 DIAGNOSIS — T7840XA Allergy, unspecified, initial encounter: Secondary | ICD-10-CM

## 2010-12-02 DIAGNOSIS — K222 Esophageal obstruction: Secondary | ICD-10-CM

## 2010-12-02 DIAGNOSIS — E785 Hyperlipidemia, unspecified: Secondary | ICD-10-CM

## 2010-12-02 MED ORDER — POTASSIUM BICARB-CITRIC ACID 20 MEQ PO TBEF: EFFERVESCENT_TABLET | ORAL | Status: DC

## 2010-12-02 MED ORDER — FLUTICASONE PROPIONATE 50 MCG/ACT NA SUSP: 2.0000 | Freq: Every day | NASAL | Status: DC

## 2010-12-02 MED ORDER — PANTOPRAZOLE SODIUM 40 MG PO TBEC: 40.0000 mg | DELAYED_RELEASE_TABLET | Freq: Every day | ORAL | Status: DC

## 2010-12-02 MED ORDER — LISINOPRIL 5 MG PO TABS: 5.0000 mg | ORAL_TABLET | Freq: Every day | ORAL | Status: DC

## 2010-12-02 MED ORDER — ATENOLOL-CHLORTHALIDONE 50-25 MG PO TABS: 1.0000 | ORAL_TABLET | Freq: Every day | ORAL | Status: DC

## 2010-12-02 MED ORDER — METFORMIN HCL 500 MG PO TABS: 500.0000 mg | ORAL_TABLET | Freq: Every day | ORAL | Status: DC

## 2010-12-02 MED ORDER — LEVOTHYROXINE SODIUM 50 MCG PO TABS: 50.0000 ug | ORAL_TABLET | Freq: Every day | ORAL | Status: DC

## 2010-12-02 MED ORDER — SIMVASTATIN 20 MG PO TABS
20.0000 mg | ORAL_TABLET | Freq: Every day | ORAL | Status: DC
Start: 2010-12-02 — End: 2011-12-16

## 2010-12-02 NOTE — Patient Instructions (Signed)
Continue your current medications except add lisinopril 5 mg once daily,,,,,,,,,,, the potential side effects are cough, and/or hives.  Return in 6 months for follow-up of your diabetes

## 2010-12-02 NOTE — Progress Notes (Signed)
  Subjective:    Patient ID: [name], female    DOB: Sep 08, 1945, 65 y.o.   MRN: [id number]  HPI [name] is a 65 year old female, nonsmoker, who comes in today for evaluation of hypertension, diabetes, hyperlipidemia, reflux esophagitis hypothyroidism.  Her hypertension is treated with Tenoretic 50 -- 25 daily.  BP 120/80.  Her hypothyroidism is treated with Synthroid 50 mcg daily.  Her diabetes is treated with metformin 250 mg before breakfast blood sugar A1c normal.  She has reflux esophagitis and a history of an esophageal stricture.  She is on chronic proton next 40 mg daily.  She should be taking two potassium tablets daily because of hypokalemia.  However, she is only taking one.  Serum potassium 3.3.  Advised to take two tabs daily.  She also takes Zocor 20 mg nightly along with baby aspirin, Lipitor, goal.  She has a history of renal cell carcinoma.  She had the cancer removed.  She's also had a total right knee replacement and lumbar disk surgery.  She gets routine eye care,,,,,,, February 2012 Dr. Luciana Axe, ophthalmologist.  No retinopathy,,,,,,,,,, routine dental care, BSE monthly, and you mammography, colonoscopy, normal 2000, tetanus, 2003, Pneumovax 2008.    Review of Systems  Constitutional: Negative.   HENT: Negative.   Eyes: Negative.   Respiratory: Negative.   Cardiovascular: Negative.   Gastrointestinal: Negative.   Genitourinary: Negative.   Musculoskeletal: Negative.   Neurological: Negative.   Hematological: Negative.   Psychiatric/Behavioral: Negative.        Objective:   Physical Exam  Constitutional: She appears well-developed and well-nourished.  HENT:  Head: Normocephalic and atraumatic.  Right Ear: External ear normal.  Left Ear: External ear normal.  Nose: Nose normal.  Mouth/Throat: Oropharynx is clear and moist.  Eyes: EOM are normal. Pupils are equal, round, and reactive to light.  Neck: Normal range of motion. Neck supple. No thyromegaly  present.  Cardiovascular: Normal rate, regular rhythm, normal heart sounds and intact distal pulses.  Exam reveals no gallop and no friction rub.   No murmur heard. Pulmonary/Chest: Effort normal and breath sounds normal.  Abdominal: Soft. Bowel sounds are normal. She exhibits no distension and no mass. There is no tenderness. There is no rebound.  Genitourinary: Vagina normal. Guaiac negative stool. No vaginal discharge found.       Bilateral breast exam normal  Musculoskeletal: Normal range of motion.  Lymphadenopathy:    She has no cervical adenopathy.  Neurological: She is alert. She has normal reflexes. No cranial nerve deficit. She exhibits normal muscle tone. Coordination normal.  Skin: Skin is warm and dry.  Psychiatric: She has a normal mood and affect. Her behavior is normal. Judgment and thought content normal.          Assessment & Plan:  Diabetes under excellent control continue metformin 250 mg daily add 10 mg of lisinopril daily.  Hypertension.  Continue Tenoretic one tablet daily.  Hypothyroidism.  Continue Synthroid 50 mcg daily.  Reflux esophagitis with history of esophageal stricture.  Continue protonic 40 mg daily.  Hypokalemia.  Change to the more tolerable.  Potassium supplement 40 mEq daily.  Hyperlipidemia.  Continue Zocor 20 nightly  Status post renal cell carcinoma.  Status post right total knee replacement.  Status post lumbar disk surgery.  Follow-up in 6 months, sooner if any problem

## 2010-12-03 ENCOUNTER — Telehealth: Payer: Self-pay | Admitting: Family Medicine

## 2010-12-03 ENCOUNTER — Ambulatory Visit: Payer: Self-pay | Admitting: Family Medicine

## 2010-12-03 DIAGNOSIS — I10 Essential (primary) hypertension: Secondary | ICD-10-CM

## 2010-12-03 NOTE — Telephone Encounter (Signed)
Pt called and has questions re: Atenolol and Lisinopril. Pt needs to know if she needs to stop taking the Atenolol, since both of these meds are for bp? Pls advise.

## 2010-12-04 ENCOUNTER — Ambulatory Visit: Payer: Self-pay | Admitting: Family Medicine

## 2010-12-07 MED ORDER — POTASSIUM BICARB-CITRIC ACID 20 MEQ PO TBEF: EFFERVESCENT_TABLET | ORAL | Status: DC

## 2010-12-07 NOTE — Telephone Encounter (Signed)
Spoke with patient.

## 2010-12-07 NOTE — Telephone Encounter (Signed)
yes

## 2010-12-10 ENCOUNTER — Other Ambulatory Visit: Payer: Self-pay | Admitting: *Deleted

## 2010-12-10 MED ORDER — POTASSIUM CHLORIDE 20 MEQ/15ML (10%) PO SOLN: 10.0000 meq | Freq: Every day | ORAL | Status: DC

## 2010-12-15 ENCOUNTER — Telehealth: Payer: Self-pay | Admitting: *Deleted

## 2010-12-15 NOTE — Assessment & Plan Note (Signed)
[facility]                         GASTROENTEROLOGY OFFICE NOTE   NAME:Blackwell, Rose ROGALSKI                          MRN:          [id number]  DATE:11/23/2007                            DOB:          01/21/1947    PROBLEM:  Dysphagia.   HISTORY:  [name] has returned for her scheduled followup.  She  underwent upper endoscopy with dilation of a distal esophageal stricture  to 18 mm.  She has mild gastritis as well.  Biopsies were unrevealing  except demonstrating mild chronic gastritis.   [name] says that her swallowing has significantly improved.  On  occasion she feels food or a pill is hanging up.   PHYSICAL EXAMINATION:  VITAL SIGNS:  Pulse 74, blood pressure 126/78,  weight 209.   IMPRESSION:  Dysphagia.  This is still a mild problem despite dilatation  therapy.  Persistent esophageal stricture remains a concern.  She may  have a mild motility disorder.   RECOMMENDATIONS:  1. Continue Protonix.  2. Barium swallow.     Barbette Hair. Arlyce Dice, MD,FACG  Electronically Signed    RDK/MedQ  DD: 11/23/2007  DT: 11/23/2007  Job #: [id number]   cc:   Tinnie Gens A. Tawanna Cooler, MD

## 2010-12-15 NOTE — Telephone Encounter (Signed)
Pt has resumed her Atenolol /Hct, and will make appt next week.  Will D/C the Lisinopril.

## 2010-12-15 NOTE — Assessment & Plan Note (Signed)
[facility]                         GASTROENTEROLOGY OFFICE NOTE   NAME:[name], [name]                          MRN:          [id number]  DATE:10/18/2007                            DOB:          01/21/1947    REFERRING PHYSICIAN:  Tinnie Gens A. Tawanna Cooler, MD   PROBLEM:  1. Difficulty swallowing and nausea.  2. Black stools.   HISTORY:  [name] is a pleasant, 65 year old African-American female, known  to Dr. Arlyce Dice, who was last seen here in 2005.  At that time, she had  undergone screening colonoscopy, was found to have colon polyps,  pathology consistent with hyperplastic polyps.  She did also have prior  EGD done in January of 2005, which did show distal esophageal stricture.  She was Savary dilated to 17 mm.   The patient states that she had a right-knee replacement done on  September 13, 2007, and started having problems with her stomach and  esophagus after she was in the hospital.  She was on Coumadin, short-  term, which has now been discontinued, and had been taking a lot of pain  medication, which she has since stopped.  She says she has been on  chronic acid-blocker therapy over the past several years; Zantac is  listed in her chart.  However, she says she usually takes over-the-  counter Prilosec once daily.  She has been having difficulty over the  past several weeks with sensation of food sticking with solids.  She  denies any odynophagia and has problems primarily with solids, but less  so with liquids.  She has also been having an increase in indigestion,  intermittent nausea without vomiting.  She says that her bowel movements  have been fairly regular and has noticed very dark or blackish stools  over the past three days with one bowel movement per day.  She has not  been on any regular Pepto Bismol, no iron supplements.  She had taken a  recent course of prednisone.  She denied any Advil, Aleve or  prescription NSAIDs.   CURRENT  MEDICATIONS:  1. Atenolol 25 daily.  2. Cyclobenzaprine 10 mg t.i.d. p.r.n.  3. Darvocet p.r.n.  4. Ranitidine b.i.d. or  5. Prilosec once daily.  6. Klor-Con 10 mEq daily.  7. Synthroid 50 mcg daily.  8. Simvastatin 40 daily.  9. HCTZ 200 b.i.d.  10.Prednisone.  She is uncertain of the dosage.  Currently off.   INTOLERANCES:  ASPIRIN with GI upset.  CODEINE with hyperactivity.   PAST HISTORY:  1. Pertinent for osteoarthritis, status post recent right-knee      replacement.  2. Previous laminectomy.  3. Hysterectomy, 1975.  4. Depression.  5. Hypertension.  6. Hyperlipidemia.  7. Hypothyroidism.   FAMILY HISTORY:  Pertinent for diabetes in a brother, prostate cancer in  a brother and father, heart disease in her father.   SOCIAL HISTORY:  Patient is married.  She has three children, currently  lives with her daughter.  She is retired.  She is a smoker, one pack  every three days.  No regular ETOH.  REVIEW OF SYSTEMS:  Pertinent for arthritic symptoms, occasional hot  flashes and otherwise completely negative, except as described in HPI.   PHYSICAL EXAM:  A well-developed, African-American female, ambulating  well.  Height is 5 feet 6, weight is 211, blood pressure 118/76, pulse is 72.  HEENT:  Atraumatic, normocephalic.  EOMI.  PERLA.  Sclerae anicteric.  CARDIOVASCULAR:  Regular rate and rhythm with S1 and S2, no murmur, rub  or gallop.  PULMONARY:  Clear to A and P.  ABDOMEN:  Soft.  She is mildly tender laterally on the left.  There is  no guarding or rebound, no mass or hepatosplenomegaly.  Bowel sounds are  active.  RECTAL EXAM:  Reveals light-brown stool, which is Hemoccult-negative.   IMPRESSION:  1. The patient is a 65 year old black female with recurrent dysphagia      and previous history of esophageal stricture.  Suspect recurrent      esophagitis and/or stricture.  2. Recent report of black stool with no evidence of GI bleeding      currently and  Hemoccult-negative stool.   PLAN:  1. Schedule upper endoscopy with esophageal dilation with Dr. Arlyce Dice.  2. Start Protonix 40 mg b.i.d.     Mike Gip, PA-C  Electronically Signed      Wilhemina Bonito. Marina Goodell, MD  Electronically Signed   AE/MedQ  DD: 10/18/2007  DT: 10/18/2007  Job #: [id number]

## 2010-12-15 NOTE — Telephone Encounter (Signed)
Discontinue lisinopril Is  patient still taking Atenolol HCT?  Return office visit this week

## 2010-12-15 NOTE — Op Note (Signed)
[name], [name]                   ACCOUNT NO.:  [id number]   MEDICAL RECORD NO.:  [id number]          PATIENT TYPE:  INP   LOCATION:  0004                         FACILITY:  [facility]   PHYSICIAN:  Mark C. Vernie Ammons, M.D.  DATE OF BIRTH:  09/07/1945   DATE OF PROCEDURE:  01/17/2009  DATE OF DISCHARGE:                               OPERATIVE REPORT   PREOPERATIVE DIAGNOSIS:  Right renal mass.   POSTOPERATIVE DIAGNOSIS:  Right renal mass.   PROCEDURE:  Right radical nephrectomy.   SURGEON:  Mark C. Vernie Ammons, M.D.   ASSISTANT:  Delia Chimes.   ANESTHESIA:  General.   SPECIMENS:  Right kidney to pathology.   BLOOD LOSS:  100 mL.   DRAINS:  A 16-French Foley catheter in the bladder.   COMPLICATIONS:  None.   INDICATIONS:  The patient is a 65 year old female patient who was found  to have a very suspicious-appearing right renal cyst that was centrally  located with enhancing nodularity.  A biopsy of this lesion revealed  atypical cells but they could not be definitely confirmed as cancer.  I  have discussed this fact, the procedure planned for today, as well as  alternatives.  We also went over the risks and complications.  The CT  scan revealed no evidence of adenopathy or metastatic spread.  A chest x-  ray was negative.   DESCRIPTION OF OPERATION:  After informed consent, the patient was  brought to the major OR, placed on the table, and administered general  endotracheal anesthesia.  She was then moved to the right flank position  and secured to the table.  The kidney rest was raised and the table was  flexed.  Her right flank was then sterilely prepped and draped and an  incision was then made extending from the tip of the right 12th rib  anteriorly in the subcostal region.  This was carried down through skin  and subcutaneous tissue to the external oblique fascia, which was  incised, as was the muscle.  The internal oblique and fascia muscle were  incised as well.  The  transversalis muscle was then divided along its  muscle fibers and a Bookwalter retractor was then inserted.  Blunt  dissection was then used to dissect Gerota's fascia from the  undersurface of the peritoneum and the dissection was carried down to  the level of the renal hilum.  The vena cava was identified with the  right renal vein and this was cleared of surrounding tissue and a #1  silk suture was then placed around this and a hemostat was attached for  identification.  I then was able to dissect superior to the vein and  identify the renal artery.  There appeared to be a small branch and this  was clamped with Hem-o-lok clips and then divided.  The main renal  artery was then doubly ligated and also a Hem-o-lok was placed  proximally and the artery divided.  No back filling of the vein was  noted, so the #1 silk suture was then tied proximally and a second was  tied distally and a Hem-o-lok placed proximally and the vein divided.  The remaining attachments in the hilum were dissected and Hem-o-loks  were applied.  Dissection then progressed superiorly and because the  lesion involved the midportion of the kidney, I elected to remove the  kidney and leave the right adrenal gland.  I therefore isolated the  tissue with a right-angle clamp, placed in Hem-o-loks proximally and  divided this around the superior pole of the kidney.  Finally attention  was directed to the inferior pole, where the ureter was identified,  doubly clamped with Hem-o-lok clips, and divided.  The specimen was then  removed and sent to pathology.   Reinspection of the wound and the hilar region revealed no evidence of  bleeding.  The wound was irrigated and reinspection revealed no para  tenotomy or further bleeding.  I therefore closed the incision in 2  layers with running #1 PDS suture closing the internal oblique fascia  first, followed by the external oblique fascia, and then irrigated the  subcutaneous  tissue, followed by closure of the skin with skin staples.  A sterile occlusive dressing was applied and the patient was awakened  and taken to the recovery room in stable and satisfactory condition.  She tolerated procedure well with no intraoperative complications.  Needle, sponge and instrument counts were correct x2 at the end of the  operation as well.      Mark C. Vernie Ammons, M.D.  Electronically Signed     MCO/MEDQ  D:  01/17/2009  T:  01/17/2009  Job:  [id number]

## 2010-12-15 NOTE — Telephone Encounter (Signed)
Pt is having swollen hands, and stomach pain since starting on the Lisinopril, and asking what to do?

## 2010-12-15 NOTE — Telephone Encounter (Signed)
Left message with Dr. Kirtland Bouchard' s recommendations.  LMTCB

## 2010-12-15 NOTE — Op Note (Signed)
[name], [name]                   ACCOUNT NO.:  [id number]   MEDICAL RECORD NO.:  [id number]          PATIENT TYPE:  INP   LOCATION:  5039                         FACILITY:  [facility]   PHYSICIAN:  Feliberto Gottron. Turner Daniels, M.D.   DATE OF BIRTH:  01/21/1947   DATE OF PROCEDURE:  09/13/2007  DATE OF DISCHARGE:  09/17/2007                               OPERATIVE REPORT   PREOPERATIVE DIAGNOSIS:  Loose right uni-compartmental knee  arthroplasty.   POSTOPERATIVE DIAGNOSIS:  Loose right uni-compartmental knee  arthroplasty.   PROCEDURE:  Revision of right total knee arthroplasty with removal of  the loose Osteonics medial uni-compartmental knee and revision to a  cemented DePuy Sigma RP knee, #2.5 femur, 3 tibia, 10 mm Sigma RP  rotating bearing and a 35 mm patellar button.  All components cemented  double batch of DePuy HV cement with 1500 mg of Zinacef in the cement.   SURGEON:  Feliberto Gottron. Turner Daniels, M.D.   ASSISTANTLaural Benes. Su Hilt, P.A.-C.   ANESTHESIA:  General endotracheal anesthesia.   ESTIMATED BLOOD LOSS:  Was minimal.   FLUIDS REPLACED:  Was 1500 mL of crystalloid.   DRAINS PLACED:  Foley catheter.  Urine output was 300 mL.  Two medium  Hemovacs.   TOURNIQUET TIME:  One hour and 45 minutes.   INDICATIONS FOR PROCEDURE:  A 65 year old woman who underwent a right  uni-compartmental knee arthroplasty in November 2004, and over the last  year it has come loose.  She was seen in consultation by her primary  surgeon, Dr. Elana Alm. Wainer.  Plain radiographs were unremarkable,  even comparison, but a bone scan was accomplished showing increased  uptake of the femoral component consistent with femoral loosening.  Because of severe debilitating pain, she desires elective revision to a  total knee arthroplasty.  The risks and benefits of surgery discussed  and questions answered.  The pain was so bad that it was preventing her  from ambulating more than a block or two at a time.   DESCRIPTION OF PROCEDURE:  The patient was identified by arm band and  taken to the operating room at [facility] after a  right femoral nerve block.  The appropriate anesthetic monitors were  attached and general endotracheal anesthesia induced with the patient in  the supine position.  The lateral posts and foot positioner were applied  to the table.  The tourniquet was applied high to the right thigh, and  the right lower extremity was prepped and draped in the usual sterile  fashion from the ankle to the tourniquet.  The limb was wrapped with an  Esmarch bandage and the tourniquet inflated to 350 mmHg with the knee  bent.  A time out was performed and the patient did receive antibiotics  preoperatively.  We began the procedure while making the anterior  midline incision utilizing the old anterior midline incision used for  the insertion of the uni-compartmental knee.  We started one hand  breadth above the patella and went over the patella, and then 1 cm  medial  to and 2 cm distal to the tibial tubercle.  Small bleeders in the  skin and subcutaneous tissue are identified and cauterized.  The  transverse retinaculum was reflected medially, allowing the medial  parapatellar arthrotomy.  At this point the patella was everted,  removing the pre-patellar fat pad and elevating the superficial medial  collateral ligament off of the proximal tibia, going from proximal to  distal, and then curing around the posteromedial corner about 4 to 5 cm.  This allowed good exposure of the uni-compartmental knee.  The femoral  component was grossly loose and was literally pulled out with finger  pressure only.  The polyethylene tibial component appeared to be well  fixed.  Using osteotomes, we then removed the tibial component, after  resecting the ACL and the PCL, and translating the proximal tibial  forward with a McCale retractor through the notch and a posterior medial   Z-retractor and lateral Hohmann retractor on the proximal tibia.  Once  the tibial component had been removed, we went ahead and removed the  tibial spines and using the step drill from the DePuy set, entered the  proximal tibia, followed by the intramedullary rod and a 0-degree  posterior slope cutting guide.  We then performed a proximal tibial  resection, which was pretty much in line with the medial resection,  leaving a thin layer of bone and cement behind on the medial side,  removing about 7 mm to 8 mm of bone and remaining cartilage laterally.   Satisfied with the proximal tibial resection, we then entered the distal  femur 2 mm anterior to the PCL origin, followed by using the step drill  from DePuy, followed by the intramedullary rod, the 5-degree right  distal femoral cutting guide set at 11 mm.  This was pinned in place  along the epicondylar axis and a standard distal femoral cut was  performed, actually removing some bone from the medial side where the  loose uni-compartmentally had been removed.  We them measured for a 2.5  femoral component, using the posterior referencing measuring guide, and  pinned this in place in neutral rotation.  Using the pin tracks, we then  hammered into place the distal femoral cutting guide for a 2.5 right  femur, performed our anterior, posterior and chamfer cuts, and once  again despite the old uni being removed, there was still some bone to be  removed with the power saw, giving [place] good fresh cuts.  The box cut was  then performed with the box cutting guide for the 2.5 femur.  The  everted patella was grasped, measured with a cutting guide at 23 mm,  felt to be sized for about a 35 button, and the posterior 9 mm of the  patella resected with a cutting guide, sized for a 35 button and  drilled.  The knee was once again hyperflexed.  We sized the proximal  tibia to a #3 tibial base plate and pinned this in place, brought up the  smoke stack  and the reamer, and performed the conical reaming.  Because  of the cement medially, we went ahead and used the power saw to clear  out the delta fin cuts and then hammered in place the bullet tip and the  trial delta fin keel.  A 2.5 right trial femoral component was assembled  and hammered onto the femur.  The knee was then reduced with a 10 mm  Sigma RP spacer to fit the 2.5 femur,  and a 35 trial patellar button was  used to check the tracking.  The patella tracked with no thumb pressure  and at this point the trial components were removed.  All bony surfaces  were water-picked clean and dried with suction and sponges.  The  components were then opened.  At the back table a double batch of DePuy  HV cement was mixed and applied to all bony metallic mating surfaces  except for the posterior condyles of the femur itself.  In order we  hammered into place a #3 tibial base plate and removed excess cement.  A  2.5 right femoral component was then hammered onto the femur and excess  cement removed.  A 35 mm patellar button was then squeezed onto the  patella and once again removed.  A 10 mm Sigma RP bearing was then  snapped onto the tibia.  The knee was reduced and brought to full  extension and then held in 5 degrees of flexion with axial pressure, as  the cement cured.  The clamp was then removed from the patella.  We  checked our tracking one more time.  Our ligamentous stability was  excellent.  Medium Hemovac drains were placed deep in the wound, which  was once again water-picked clean and dried with suction and sponges.  The parapatellar arthrotomy was then closed with running #1 Vicryl  suture.  The  subcutaneous tissues were closed with #0 and #2-0 undyed Vicryl suture  and the skin with skin staples.  A dressing of Xeroform, 4 x 4 dressing,  sponges, Webril and an Ace wrap were then applied.  The tourniquet was  let down.   The patient was then awakened and taken to the recovery  room without  difficulty.      Feliberto Gottron. Turner Daniels, M.D.  Electronically Signed     FJR/MEDQ  D:  10/10/2007  T:  10/11/2007  Job:  [id number]

## 2010-12-15 NOTE — Discharge Summary (Signed)
NAMEGREIDY, [name]                   ACCOUNT NO.:  [id number]   MEDICAL RECORD NO.:  [id number]          PATIENT TYPE:  INP   LOCATION:  5039                         FACILITY:  [facility]   PHYSICIAN:  Feliberto Gottron. Turner Daniels, M.D.   DATE OF BIRTH:  01/21/1947   DATE OF ADMISSION:  09/13/2007  DATE OF DISCHARGE:  09/17/2007                               DISCHARGE SUMMARY   FINAL DIAGNOSES:  1. Degenerative joint disease right knee.  2. Postoperative blood loss anemia.  3. Lupus erythematosus without fever.  4. Hypokalemia.  5. Hypertension.  6. Hypothyroidism.   PROCEDURES:  On September 13, 2007 total right knee arthroplasty with the  surgeon, Dr. Turner Daniels.   HISTORY OF PRESENTING ILLNESS:  This is a 65 year old African-American  female who has had history of right knee degenerative joint disease.  She is followed by Dr. Turner Daniels too. She has failed her medical  interventions and now is ready for a total knee arthroplasty.  Subsequently she is scheduled.   HOSPITAL COURSE:  The patient admitted to [facility]  on  September 13, 2007 where she underwent a total knee arthroplasty, right  knee. She tolerated the procedure well with no intraoperative  complications.  Postoperatively the patient had noted problems with  control of potassium which was low.  She was on a diuretic and she had  no potassium supplements.  She had elevated white count during her stay  but no significant fevers.  She did well overall as far as recovery from  the knee surgery and with physical therapy and getting out of bed.  By  the fourth postoperative day she was ready for discharge to home.  No  untoward events occurred during her stay.  Prior to her discharge to  home  she was given 20 mEq of p.o. potassium.  Was told that when she  went home to continue her potassium but take it twice a day rather than  once a day as she had been taking.  Her lupus erythematosus, at the time  of discharge her white count was 15.4.   This was down from 17, 000.  It  was 17.2 on the day before.  This was a downward trend but because of  __________ she would be started on Cipro 500 mg to take b.i.d.  for the  next 7 days.  She will then follow up with Dr. Turner Daniels in 10 days for  staple removal.  At the time of discharge her wound was clean and dry  and there was no erythema.  No signs of any infection.  No Homans sign.  No calf pain.  Peripheral pulses intact.  Neuro was grossly intact.   At the time of discharge her medications are:  1. Synthroid 50 mcg daily.  2. __________  200 mg b.i.d.  3. Flexeril 10 mg t.i.d. p.r.n.  4. Simvastatin 40 mg at bedtime.  5. Klor-Con 20 mEq daily.  This will now be changed to b.i.d.  6. Atenolol  __________  50/25 one p.o. daily.  7. She will take Coumadin as  directed.  8. She is given Percocet for pain 1 or 2 p.o. q. __________  h. p.r.n.      for pain, 5 mg  9. Cipro.   The patient will be discharge to home  in satisfactory and stable  condition on September 13, 2007.      Phineas Semen, P.A.      Feliberto Gottron. Turner Daniels, M.D.  Electronically Signed    CL/MEDQ  D:  09/17/2007  T:  09/18/2007  Job:  [id number]

## 2010-12-15 NOTE — Discharge Summary (Signed)
[name], [name]                   ACCOUNT NO.:  [id number]   MEDICAL RECORD NO.:  [id number]          PATIENT TYPE:  INP   LOCATION:  1414                         FACILITY:  [facility]   PHYSICIAN:  Mark C. Vernie Ammons, M.D.  DATE OF BIRTH:  Jul 28, 1946   DATE OF ADMISSION:  01/17/2009  DATE OF DISCHARGE:  01/20/2009                               DISCHARGE SUMMARY   PRINCIPAL DIAGNOSIS:  Right renal mass.   PROCEDURE:  Right radical nephrectomy.   DISPOSITION:  The patient is discharged home in a stable and  satisfactory as well as improved condition.  She is tolerating regular  diet.   ACTIVITY:  Her activity will be limited to no heavy lifting, straining  or vigorous activity.   FOLLOW UP:  She will return to my office for follow-up in 1 week for  skin staple removal.   DISCHARGE MEDICATIONS:  1. Atenolol/chlorthalidone 50/25.  2. Levothyroxine 0.05 mg.  3. Simvastatin 20 mg.  4. Klor-Con 20 mEq.  5. Tylox 1 to 2 q.4 h. p.r.n. #40.   BRIEF HISTORY:  The patient is a 65 year old female who was found to  have a suspicious right renal cyst with mural nodularity that revealed  enhancement with contrast on CT scan.  There was no evidence of any  metastatic disease.  The lesion was biopsied and revealed atypical  cells, and we had discussed the options, and the patient elected to  proceed with surgical removal of the lesion.  She was admitted to the  hospital electively for that.   Her full history and physical was noted in her hospital chart and will  not be redictated here.   HOSPITAL COURSE:  On January 17, 2009, she was admitted to the hospital  electively and underwent a right radical nephrectomy without  complication.  She required no transfusions intraoperatively and  postoperatively followed an unremarkable course.  She was maintained on  intravenous fluids and Dilaudid PCA, and her diet was advanced.  She was  begun on early ambulation the day after her surgery, and her  Foley  catheter was removed at that time.  She tolerated advancement of her  diet and was having minimal pain with a well-healing wound and was felt  ready for discharge on her third postoperative day.   She will be discharged with a prescription for Tylox and will follow up  in my office on January 24, 2009, at 3:30 for skin staple removal.  She was  given written instructions upon discharge.      Mark C. Vernie Ammons, M.D.  Electronically Signed     MCO/MEDQ  D:  01/20/2009  T:  01/20/2009  Job:  [id number]

## 2010-12-18 NOTE — Op Note (Signed)
[name], AUBLE                             ACCOUNT NO.:  [id number]   MEDICAL RECORD NO.:  [id number]                   PATIENT TYPE:  INP   LOCATION:  2550                                 FACILITY:  [facility]   PHYSICIAN:  Robert A. Thurston Hole, M.D.              DATE OF BIRTH:  01/21/1947   DATE OF PROCEDURE:  06/10/2003  DATE OF DISCHARGE:                                 OPERATIVE REPORT   PREOPERATIVE DIAGNOSIS:  Right knee medial compartment degenerative joint  disease.   POSTOPERATIVE DIAGNOSIS:  Right knee medial compartment degenerative joint  disease.   PROCEDURE:  Right unicompartmental arthroplasty using [employer] system with  medium femoral component and medium 8 mm tibial component.   SURGEON:  Elana Alm. Thurston Hole, M.D.   ASSISTANTS:  Loreta Ave, M.D., and Julien Girt, P.A.   ANESTHESIA:  General anesthesia.   OPERATIVE TIME:  One hour and 20 minutes.   COMPLICATIONS:  None.   DESCRIPTION OF PROCEDURE:  [name] was brought to the operating room on  June 10, 2003, placed on the operating table in the supine position.  After an adequate level of general anesthesia was obtained, her right knee  was examined under anesthesia.  Range of motion 0 to 120 degrees with mild  varus deformity.  Knee stable ligamentous examination with normal patella  tracking.  She had a Foley catheter placed under sterile conditions and  received Ancef 1 g IV preoperatively for prophylaxis.  The right leg was  prepped using sterile DuraPrep and draped using sterile technique.  Leg was  exsanguinated and a thigh tourniquet elevated 375 mm.  Initially, through a  10 cm longitudinal incision based over the patella, initial exposure was  made.  The underlying subcutaneous tissues were incised in line with skin  incision.  A median arthrotomy was performed, revealing an excessive amount  of normal-appearing joint fluid.  The articular surfaces were inspected.  She had grade III and  mild grade IV changes in the medial compartment over  75%.  She had only mild grade I and II changes laterally and 25% grade III  changes in the patellofemoral joint but this was felt to be amenable to a  medial compartment, unicompartmental arthroplasty.  The medial meniscus  remnants were removed.  Osteophytes were removed off the medial femoral  condyle.  The medial patella facet had a small osteophyte and this was  removed as well.  After this was done and the extramedullary tibial guide  was placed and this was pinned in position.  At this point, then the  sagittal saw was used to cut 8 mm down just medial to the tibial spines and  then using this proximal tibial cutting jig, the 8 mm proximal tibial cut  was performed.  This piece of bone was removed and found to give an  excellent and satisfactory cut.  After this  was done, then the femur was  sized.  A medium was found to be the appropriate size and the medium cutting  jig was placed up against the tide mark in the appropriate of rotation and  then this was pinned in position and the fin cuts were made.  After this was  done, then the routing femoral cutter was placed and using a bur, this cut  was made as well.  After this was done, there was found to be excellent  position of the femoral component as of the tibial component.  Trials were  then placed with a #8 mm medium size tibial trial and a size medium femoral  trial, taken through a range of motion and found to be stable with excellent  correction of her varus deformity.  After this was done, then the tibial  keel cutter was placed and the keel cut was made.  At this point, it was  felt that all trial components were of excellent size, fit and stability.  They were then removed and the knee was jet lavage irrigated with two liters  of saline solution.  The tibial component with cement backing was then  placed into position with an excellent fit with excess cement being  removed  from around the edges.  The femoral component with cement backing was also  placed into position and hammered into place with cement backing also with  an excellent fit with excess cement being removed from around the edges.  The knee taken through a range of motion 0 to 120 degrees with excellent  stability and no lift off of the tibial tray and excellent correction of her  varus deformity.  After the cement hardened, there was found to be no  further deformity whatsoever with normal patella tracking.  At this point,  it was felt that all the components were of excellent size, fit and  stability.  The knee was further irrigated with solution and then the  arthrotomy was closed with #1 Vicryl sutures, subcutaneous tissues closed  with 0 and 2-0 Vicryl, skin closed with skin staples.  Sterile dressings  were applied and a long leg splint.  The patient then had a femoral nerve  block placed by anesthesia for postoperative pain control.  She was then  awakened, extubated and taken to the recovery room in stable condition.  Needle and sponge counts correct x2 at the end of the case.                                               Robert A. Thurston Hole, M.D.    RAW/MEDQ  D:  06/10/2003  T:  06/10/2003  Job:  [id number]

## 2010-12-18 NOTE — Op Note (Signed)
[name], [name]                   ACCOUNT NO.:  [id number]   MEDICAL RECORD NO.:  [id number]          PATIENT TYPE:  AMB   LOCATION:  SDS                          FACILITY:  [facility]   PHYSICIAN:  Henry A. Pool, M.D.    DATE OF BIRTH:  01/21/1947   DATE OF PROCEDURE:  04/19/2005  DATE OF DISCHARGE:                                 OPERATIVE REPORT   PREOPERATIVE DIAGNOSES:  Left L4-5, left L5-S1 stenosis with radiculopathy.   POSTOPERATIVE DIAGNOSES:  Left L4-5, left L5-S1 stenosis with radiculopathy.   OPERATION PERFORMED:  Left L4-5 and left L5-S1 decompressive laminotomy with  foraminotomies.   SURGEON:  Kathaleen Maser. Pool, M.D.   ASSISTANT:  Donalee Citrin, M.D.   ANESTHESIA:  General endotracheal.   INDICATIONS FOR PROCEDURE:  The patient is a 65 year old female with a  history of back and left lower extremity pain consistent with both a left-  sided L5 and S1 radiculopathy.  Work-ups demonstrated evidence of facet  arthropathy and degenerative disk disease with resultant spinal stenosis  worse on the left side involving the exiting left-sided L5 and S1 nerve  roots.  The patient has been counseled as to her options.  She had decided  to proceed with L4-5 and L5-S1 decompressive laminotomies and  foraminotomies.   DESCRIPTION OF PROCEDURE:  The patient was taken to the operating room and  placed on the table in the supine position.  After adequate level of  anesthesia was achieved, the patient was positioned prone onto a Wilson  frame and appropriately padded.  The patient's lumbar region was prepped and  draped sterilely.  A 10 blade was used to make a linear skin incision  overlying the L5-S1 interspace.  This was carried down sharply in the  midline.  A subperiosteal dissection was then performed exposing the lamina  and facet joints of L4, L5 and S1.  Deep self-retaining retractor was  placed.  Intraoperative x-ray was taken, the level was confirmed.  Decompressive laminotomy  was then performed at L4-5 and L5-S1 using high  speed drill and Kerrison rongeurs to remove the inferior aspect of the  lamina above, the medial aspect of the facet joint and the superior rim of  the lamina below.  The ligamentum flavum was then elevated and resected in  piecemeal fashion at both levels.  Underlying thecal sac and exiting nerve  roots were identified.  Starting first at L4-5, a microscope was brought  into the field and used for microdissection of the L5 nerve root within its  foramen.  Epidural venous plexus was coagulated and cut.  The spinal canal  was inspected.  There was no evidence of disk herniation or other  compression.  Attention was then placed to the L5-S1 level.  Once again the  thecal sac and S1 nerve root were mobilized and retracted toward the  midline.  At this level, there was some evidence of a small amount of free  disk herniation which was removed using blunt nerve hooks.  The disk space  itself was incised with a 15 blade.  An aggressive diskectomy was then  performed using pituitary rongeurs and upward angled pituitary rongeurs and  Epstein curets.  The canal was then inspected.  There was no evidence of any  residual compression.  The wound was then irrigated with antibiotic  solution.  Gelfoam was placed topically for hemostasis which was found to be  good.  Microscope and retractor system were removed.  Hemostasis in the muscle was achieved with electrocautery.  The wound was  then closed in layers with Vicryl sutures.  Steri-Strips and sterile  dressing were applied.  There were no apparent complications.  The patient  tolerated the procedure well and returned to the recovery room  postoperatively.           ______________________________  Kathaleen Maser Pool, M.D.     HAP/MEDQ  D:  04/19/2005  T:  04/20/2005  Job:  [id number]

## 2010-12-18 NOTE — Op Note (Signed)
NAMEALEXANDREA, WESTERGARD                   ACCOUNT NO.:  [id number]   MEDICAL RECORD NO.:  [id number]          PATIENT TYPE:  AMB   LOCATION:  DSC                          FACILITY:  [facility]   PHYSICIAN:  Cindee Salt, M.D.       DATE OF BIRTH:  01/21/1947   DATE OF PROCEDURE:  01/06/2006  DATE OF DISCHARGE:                                 OPERATIVE REPORT   PREOPERATIVE DIAGNOSIS:  Carpal tunnel syndrome, ulnar neuropathy, right  arm, right elbow.   POSTOPERATIVE DIAGNOSIS:  Carpal tunnel syndrome, ulnar neuropathy, right  arm, right elbow.   OPERATION:  Decompression right median nerve at the wrist, decompression  right ulnar nerve at the elbow.   SURGEON:  Cindee Salt, M.D.   ASSISTANT:  Carolyne Fiscal R.N.   ANESTHESIA:  General.   HISTORY:  The patient is a 65 year old female with a history of carpal  tunnel syndrome, ulnar neuropathy of her right arm at the wrist and elbow  respectively.  This has not responded to conservative treatment. She is  aware of the risks and complications of the surgery.  She is identified in  the preoperative area, the arm marked, questions encouraged and answered.   PROCEDURE:  The patient was brought to the operating room where a general  anesthetic was carried out without difficulty.  She was prepped using  DuraPrep, supine position, right arm free.  The limb was exsanguinated with  an Esmarch bandage.  A tourniquet was placed high on the arm and was  inflated to 250 mmHg.  A longitudinal incision was made in the palm and  carried down through the subcutaneous tissue.  Bleeders were  electrocauterized, the palmar fascia was split, superficial palmar arch  identified.  The flexor tendon to the ring and little finger identified.  To  the ulnar side of the median nerve, the carpal retinaculum was incised with  sharp dissection. Right angle and Sewall retractor were placed between skin  and forearm fascia.  The fascia was released for approximately 1.5 cm  proximal to the wrist crease. Compression of the nerve was immediately  apparent with an area of hyperemia, tenosynovium tissue was thickened, no  further lesions were identified.  The wound was irrigated.  The skin was  closed with interrupted 5-0 nylon sutures.  A separate incision was then  made over the medial aspect of the right elbow, carried down through the  subcutaneous tissue.  Bleeders were again electrocauterized.  Dissection was  carried down to Osborne's fascia.  The posterior branch of the medial  antebrachial cutaneous nerve of the forearm was identified and protected.  The ulnar nerve was identified.  A very thick band was present at Osborne's  fascia and in the entrance of the flexor carpi ulnaris.  This was released.  The dissection was carried distally releasing the fascial bands and flexor  carpi ulnaris. Fasciotomy was performed.  Proximally, the dissection was  carried to the arcade of Struthers.  A release performed.  The nerve was  left in situ along with its vessels.  The bleeders were  electrocauterized.  The arm was placed through full flexion and extension, no subluxation was  identified.  The wound was irrigated.  The subcutaneous tissue was closed  with interrupted 4-0 Vicryl and the skin with subcuticular 3-0 Monocryl  sutures.  Steri-Strips were applied.  A sterile compressive dressing and  long-arm splint was applied.  The patient tolerated the procedure well.  A splint was placed in 30 degrees  of flexion. The patient was taken to the recovery room for observation in  satisfactory condition. On deflation of the tourniquet, all fingers  immediately pinked.  She is discharged home to return to the [facility]  [place] in one week on [place] NX.           ______________________________  Cindee Salt, M.D.     GK/MEDQ  D:  01/05/2006  T:  01/05/2006  Job:  [id number]

## 2010-12-18 NOTE — Discharge Summary (Signed)
[name], [name]                             ACCOUNT NO.:  [id number]   MEDICAL RECORD NO.:  [id number]                   PATIENT TYPE:  INP   LOCATION:  5013                                 FACILITY:  [facility]   PHYSICIAN:  Robert A. Thurston Hole, M.D.              DATE OF BIRTH:  01/21/1947   DATE OF ADMISSION:  06/10/2003  DATE OF DISCHARGE:  06/12/2003                                 DISCHARGE SUMMARY   ADMISSION DIAGNOSES:  1. End-stage degenerative joint disease, right knee medial compartment.  2. Hypertension.   DISCHARGE DIAGNOSES:  1. End-stage degenerative joint disease, right knee medial compartment.  2. Hypertension.  3. Status post unicompartmental arthroplasty.   HISTORY OF PRESENT ILLNESS:  The patient is a 65 year old black female who  has a long-standing history of right knee pain.  She has tried anti-  inflammatories and Cortisone injections all without success.  She had a  debriding arthroscopy that showed bone-on-bone osteoarthritis in her medial  compartment.  She has no osteoarthritis in her lateral compartment or in her  patellofemoral compartment.  With these findings, she understands risks,  benefits, and possible complications of a right unicompartmental knee  replacement.   PROCEDURES:  On June 10, 2003, the patient underwent a right  unicompartmental knee replacement by Dr. Thurston Hole.  Postoperatively, she had a  femoral nerve block by anesthesia.   HOSPITAL COURSE:  She was admitted postoperatively for pain control,  physical therapy, and DVT prophylaxis.  On postoperative day #1, she did  very well, pain was under control with PCA.  She was switched to p.o. pain  medications.  She tolerated the procedure well.  Surgical wound was well  approximated.  She was started on Lovenox for DVT prophylaxis.  On  postoperative day #1, her Foley was discontinued.  On postoperative day #2,  she continued to progress.  She was independently ambulatory with the  assistance of a walker with physical therapy.  Surgical wound was well  approximated.  She is on Coumadin and Lovenox for DVT prophylaxis.  She was  discharged to home in stable condition on Coumadin, Lovenox, Percocet for  pain, Enuretic.  She has been instructed to keep her wound clean and dry,  call with increased pain, increased redness, increased swelling.  She will  follow up in the office on June 17, 2003.      [name], P.A.                  Robert A. Thurston Hole, M.D.    KS/MEDQ  D:  08/10/2003  T:  08/11/2003  Job:  [id number]

## 2010-12-18 NOTE — Op Note (Signed)
NAMEABREE, ROMICK                   ACCOUNT NO.:  [id number]   MEDICAL RECORD NO.:  [id number]          PATIENT TYPE:  AMB   LOCATION:  SDS                          FACILITY:  [facility]   PHYSICIAN:  Henry A. Pool, M.D.    DATE OF BIRTH:  01/21/1947   DATE OF PROCEDURE:  09/27/2006  DATE OF DISCHARGE:                               OPERATIVE REPORT   PREOPERATIVE DIAGNOSIS:  Right L3-4 stenosis and herniated nucleus  pulposus.  The right L5-S1 herniated nucleus pulposus.   POSTOPERATIVE DIAGNOSIS:  Right L3-4 stenosis and herniated nucleus  pulposus.  The right L5-S1 herniated nucleus pulposus.   PROCEDURE NOTE:  Right L3-4 decompressive laminotomy and foraminotomy,  microdiskectomy.  Right L5-S1 laminotomy and microdiskectomy (disease  done through separate skin incision).   SURGEONS:  Kathaleen Maser. Pool, M.D.   ASSISTANT:  Reinaldo Meeker, M.D.   ANESTHESIA:  General orotracheal.   INDICATION:  [name] is a 65 year old female with history of severe  right lower extremity pain, paresthesias and symptoms of mixed  radiculopathy.  Workup demonstrates evidence of marked stenosis at L3-4  with a lateral disk herniation complicating this.  The patient also has  evidence of significant disk degeneration with a rightward L5-S1 disk  herniation causing compression of right-sided S1 nerve root.  We  discussed options of management.  The patient has decided to proceed  with right-sided L3-4 and right-sided L5-S1 laminotomy and  microdiskectomy.   DESCRIPTION OF PROCEDURE:  The patient was taken to the operating room  and placed on operating table in the supine position. After adequate  level of anesthesia was achieved the patient prone onto Wilson frame,  appropriately padded, and lumbar regions prepped and draped sterilely. A  10 blade was used to make skin incision overlying the L3-4 and a  separate incision was made over the L5-S1 levels.  This carried down  sharply in the midline.   Subperiosteal dissection was performed exposing  the lamina facet joints of L3-4 and L5-S1 on the right side.  Deep self-  retaining retractor placed, intraoperative x-ray was taken, levels were  confirmed.  Decompressive laminotomy was then performed both L3-4, L4-5  using high-speed drill and Kerrison rongeurs removing the inferior  aspect of the lamina above, the medial aspect of the facet joint and the  superior aspect of the lamina below.  Ligament flavum was elevated both  levels and resected piecemeal fashion using Kerrison rongeurs for thecal  sac.  The exiting nerve roots were identified.  At L3-4 an aggressive  foraminotomy was performed along the course of exiting L4 nerve root.  Microscope brought into the field for microdissection.  Starting first  at L5-S1 on the patient's right side.  Thecal sac and nerve roots were  mobilized gently.  Epidural and venous plexus was coagulated and cut.  Disk herniation was readily apparent.  This incised 15 blade in  rectangular fashion.  A wide disk space clean-out was achieved using  pituitary rongeurs, upward angle pituitary rongeurs, and Epstein  curettes. All elements of the disk herniation was resected.  All loose  or obviously degenerative disk material was removed from the interspace.  At this point a very thorough diskectomy was achieved.  Attention then  placed to the L3-4 level.  Thecal sac and L4 nerve root were gently  mobilized tracked towards midline.  The lateral disk herniation was  encountered.  This was incised 15 blade in rectangular fashion.  Wide  disk space clean-out was achieved using pituitary rongeurs, upward angle  pituitary rongeurs and Epstein curettes.  At this point all elements of  the disk herniation were completely resected.  All loose or obviously  degenerative disk material was removed from the interspace.  At this  point a very thorough decompression was achieved.  Wound was then  irrigated with  antibiotic solution both levels.  Gelfoam was placed  topically for hemostasis at both levels.  Wounds were then both closed  with Vicryl sutures in layers.  There were no complication.  She  tolerated the procedure well and she returns to recovery room for  postoperative care.           ______________________________  Kathaleen Maser. Pool, M.D.     HAP/MEDQ  D:  09/27/2006  T:  09/27/2006  Job:  [id number]

## 2010-12-18 NOTE — Op Note (Signed)
[facility]  Patient:    [name], [name]                            MRN: [id number]. Date: 05/16/00 Attending:  Elana Alm. Thurston Hole, M.D.                           Operative Report  PREOPERATIVE DIAGNOSES: 1. Right knee medial and lateral meniscal tears. 2. Right knee chondromalacia.  POSTOPERATIVE DIAGNOSES: 1. Right knee medial and lateral meniscal tears. 2. Right knee chondromalacia.  PROCEDURES: 1. Right knee EUA followed by arthroscopic partial medial and lateral    meniscectomies. 2. Right knee chondroplasty.  SURGEON:  Elana Alm. Thurston Hole, M.D.  ASSISTANT:  Julien Girt, P.A.  ANESTHESIA:  Local and MAC.  OPERATIVE TIME:  30 minutes.  COMPLICATIONS:  None.  INDICATIONS FOR PROCEDURE:  [name] is a 65 year old woman who has had significant right knee pain for the past three to four months increasing in nature with signs and symptoms consistent with medial and lateral meniscal tears and chondromalacia, who has failed conservative care and is now to undergo arthroscopy.  DESCRIPTION:  [name] was brought to the operating room on May 16, 2000 after a block had been placed in the holding room.  She was placed on the operative table in supine position.  Her right knee was examined under anesthesia.  Range of motion 0-125 degrees, 1-2+ crepitation, knee stable ligamentous exam with normal patella tracking.  The right leg was prepped using sterile Betadine and draped using sterile technique.  Originally through an inferolateral portal the arthroscope with a pump attached was placed, and through an inferomedial portal an arthroscopic probe was placed.  On initial inspection of the medial compartment she was found to have 75% grade 3 chondromalacia of medial femoral condyle, medial tibial plateau, which was thoroughly debrided.  Medial meniscus showed tearing of the posterior medial horn of which 50% was resected back to a stable  rim.  Intercondylar notch inspected; anterior and posterior cruciate ligaments were found to be normal. Lateral compartment inspected; she only had mild grade 1-2 chondromalacia. Lateral meniscus showed a tear of the posterior and lateral inner surface of 25% which was resected back to a stable rim.  Patellofemoral joint showed only mild grade 1-2 chondromalacia.  The patella tracked normally.  Moderate synovitis in the medial and lateral gutters was debrided; otherwise they were free of pathology.  After this was done it was felt that all pathology had been satisfactorily addressed.  The instruments were removed.  The portals were closed with 3-0 nylon suture and injected with 0.25% Marcaine with epinephrine and 4 mg of morphine.  Sterile dressings were applied and the patient awakened and taken to the recovery room in stable condition.  FOLLOW-UP CARE:  [name] will be followed as an outpatient on Vicodin and Naprosyn.  See her back in the office in a week for sutures out and follow-up.DD:  12/07/00 TD:  12/08/00 Job: [id number]

## 2010-12-29 ENCOUNTER — Encounter: Payer: Self-pay | Admitting: Family Medicine

## 2010-12-29 ENCOUNTER — Ambulatory Visit (INDEPENDENT_AMBULATORY_CARE_PROVIDER_SITE_OTHER): Payer: Medicare Other | Admitting: Family Medicine

## 2010-12-29 DIAGNOSIS — R059 Cough, unspecified: Secondary | ICD-10-CM

## 2010-12-29 DIAGNOSIS — R058 Other specified cough: Secondary | ICD-10-CM

## 2010-12-29 DIAGNOSIS — E119 Type 2 diabetes mellitus without complications: Secondary | ICD-10-CM

## 2010-12-29 DIAGNOSIS — R05 Cough: Secondary | ICD-10-CM

## 2010-12-29 DIAGNOSIS — J309 Allergic rhinitis, unspecified: Secondary | ICD-10-CM

## 2010-12-29 DIAGNOSIS — T464X5A Adverse effect of angiotensin-converting-enzyme inhibitors, initial encounter: Secondary | ICD-10-CM

## 2010-12-29 MED ORDER — LOSARTAN POTASSIUM 25 MG PO TABS: ORAL_TABLET | ORAL | Status: DC

## 2010-12-29 NOTE — Progress Notes (Signed)
  Subjective:    Patient ID: [name], female    DOB: [birth year], 65 y.o.   MRN: [id number]  HPI [name] is a delightful, 65 year old female with type 2 diabetes and hypertension who comes in today for reevaluation.  We started her on 5 mg of lisinopril for renal protection and she developed a severe cough.  She stopped the medication, and the symptoms went away.  We discussed options, which include now adding an arb   Review of Systems  review of systems negative    Objective:   Physical Exam     Well-developed well-nourished, female, in no acute distress   Assessment & Plan:  Diabetes type II, intolerant of ACE,,,,,,,,, severe cough,,,,,,,,,,, Cozaar 12.5 mg daily BP check daily, decrease Tenoretic to one half tab daily follow-up in 4 weeks

## 2010-12-29 NOTE — Patient Instructions (Signed)
Take the Tenoretic one half tablet daily.  Also, the Cozaar one half tablet daily.  Blood pressure checked daily in the morning.  Follow-up in 4 weeks

## 2011-01-26 ENCOUNTER — Ambulatory Visit: Payer: Medicare Other | Admitting: Family Medicine

## 2011-01-27 ENCOUNTER — Other Ambulatory Visit: Payer: Self-pay | Admitting: *Deleted

## 2011-01-27 MED ORDER — ALLOPURINOL 300 MG PO TABS: 300.0000 mg | ORAL_TABLET | Freq: Every day | ORAL | Status: DC

## 2011-02-02 ENCOUNTER — Ambulatory Visit (INDEPENDENT_AMBULATORY_CARE_PROVIDER_SITE_OTHER): Payer: Medicare Other | Admitting: Family Medicine

## 2011-02-02 ENCOUNTER — Encounter: Payer: Self-pay | Admitting: Family Medicine

## 2011-02-02 DIAGNOSIS — I10 Essential (primary) hypertension: Secondary | ICD-10-CM

## 2011-02-02 DIAGNOSIS — E119 Type 2 diabetes mellitus without complications: Secondary | ICD-10-CM

## 2011-02-02 DIAGNOSIS — R059 Cough, unspecified: Secondary | ICD-10-CM

## 2011-02-02 DIAGNOSIS — R05 Cough: Secondary | ICD-10-CM

## 2011-02-02 NOTE — Patient Instructions (Signed)
Continued your current medication.  Return in 6 months, sooner if any problems

## 2011-02-02 NOTE — Progress Notes (Signed)
  Subjective:    Patient ID: [name], female    DOB: 10/07/1945, 65 y.o.   MRN: [id number]  HPI [name] is a 65 year old female, who comes in today for evaluation of hypertension, diabetes, and a cough.  We saw her last she had developed a cough from her lisinopril.  We therefore stopped the lisinopril and start her on the start and 25 mg one half tab.  Daily, along with Tenoretic 50 -- 25 one half tab daily BP 120/80.  Morning.  Blood sugar today was 90 on Glucophage 500 daily.   Review of Systems    General metabolic review of systems otherwise negative cough.  This disappeared since she stopped the ACE inhibitor Objective:   Physical Exam    Well-developed well-nourished, female, in no acute distress.  BP right arm 120/80    Assessment & Plan:  Hypertension under good control with Tenoretic 50 -- 25 dose one half tab.  Daily, along with Cozaar 25 mg dose one half tab daily.  Cough secondary to ACE inhibitor resolved since we stopped the ACE inhibitor.  Diabetes type 2, under good control

## 2011-02-24 ENCOUNTER — Other Ambulatory Visit (HOSPITAL_COMMUNITY): Payer: Self-pay | Admitting: Urology

## 2011-02-24 ENCOUNTER — Ambulatory Visit (HOSPITAL_COMMUNITY)
Admission: RE | Admit: 2011-02-24 | Discharge: 2011-02-24 | Disposition: A | Discharge status: Home / Self Care | Payer: Medicare Other | Source: Ambulatory Visit | Attending: Urology | Admitting: Urology

## 2011-02-24 DIAGNOSIS — C649 Malignant neoplasm of unspecified kidney, except renal pelvis: Secondary | ICD-10-CM | POA: Insufficient documentation

## 2011-02-24 DIAGNOSIS — J984 Other disorders of lung: Secondary | ICD-10-CM | POA: Insufficient documentation

## 2011-03-04 ENCOUNTER — Other Ambulatory Visit: Payer: Self-pay | Admitting: *Deleted

## 2011-03-04 DIAGNOSIS — E039 Hypothyroidism, unspecified: Secondary | ICD-10-CM

## 2011-03-04 MED ORDER — LEVOTHYROXINE SODIUM 50 MCG PO TABS: 50.0000 ug | ORAL_TABLET | Freq: Every day | ORAL | Status: DC

## 2011-03-16 ENCOUNTER — Ambulatory Visit (INDEPENDENT_AMBULATORY_CARE_PROVIDER_SITE_OTHER): Payer: Medicare Other | Admitting: Family Medicine

## 2011-03-16 ENCOUNTER — Encounter: Payer: Self-pay | Admitting: Family Medicine

## 2011-03-16 VITALS — BP 118/80 | HR 97 | Temp 98.1°F | Wt 208.0 lb

## 2011-03-16 DIAGNOSIS — J189 Pneumonia, unspecified organism: Secondary | ICD-10-CM

## 2011-03-16 MED ORDER — HYDROCODONE-HOMATROPINE 5-1.5 MG/5ML PO SYRP: 5.0000 mL | ORAL_SOLUTION | Freq: Four times a day (QID) | ORAL | Status: AC | PRN

## 2011-03-16 MED ORDER — CLARITHROMYCIN 500 MG PO TABS: 500.0000 mg | ORAL_TABLET | Freq: Two times a day (BID) | ORAL | Status: AC

## 2011-03-16 NOTE — Patient Instructions (Signed)
Rest at home.  Drink lots of liquids.  Biaxin 500 mg twice daily.  Hydromet one half to 1 teaspoon 3 times a day p.r.n. For cough.  Return Thursday afternoon for follow-up

## 2011-03-16 NOTE — Progress Notes (Signed)
  Subjective:    Patient ID: [name], female    DOB: October 23, 1945, 65 y.o.   MRN: [id number]  Ernster is a 65 year old female, nonsmoker, who comes in today for evaluation of a cough.  She states that last Tuesday she developed a mild sore throat went away in a couple days.  She then developed a cough and then last night began running temperature 102 and bringing up discolored sputum.  She's never had trouble with her lungs in the past.   Review of Systems General pulmonary via systems otherwise negative    Objective:   Physical Exam  Well-developed well-nourished, female, in no acute distress.  Examination of the HEENT were negative.  Neck is supple.  Thyroid is not enlarged.  No adenopathy.  Lungs shows crackles, right base, consistent with a pneumonia      Assessment & Plan:  Right lower lobe pneumonia.  Plan Biaxin 500 b.i.d. Drink lots of liquids.  Cough syrup return in 48 hours for follow-up

## 2011-03-18 ENCOUNTER — Ambulatory Visit (INDEPENDENT_AMBULATORY_CARE_PROVIDER_SITE_OTHER): Payer: Medicare Other | Admitting: Family Medicine

## 2011-03-18 ENCOUNTER — Encounter: Payer: Self-pay | Admitting: Family Medicine

## 2011-03-18 VITALS — BP 120/80 | Temp 98.3°F | Wt 199.0 lb

## 2011-03-18 DIAGNOSIS — J189 Pneumonia, unspecified organism: Secondary | ICD-10-CM

## 2011-03-18 NOTE — Patient Instructions (Signed)
Finish the antibiotic pill bottles empty.  Return p.r.n.

## 2011-03-18 NOTE — Progress Notes (Signed)
  Subjective:    Patient ID: [name], female    DOB: Jul 20, 1946, 65 y.o.   MRN: [id number]  HPI [name] is a 65 year old female, nonsmoker, who comes in today for follow-up of pneumonia.  We saw her two days ago with symptoms of fever, chills, and cough.  She had crackles, right base, consistent with pneumonia.  We empirically start her on Biaxin 500 mg b.i.d. Rest at home.  Lots of liquids and cough syrup.  She comes back today.  She is afebrile.  No more fever, chills.  She is still bringing up some discolored sputum.  No shortness of breath.  No side effects from medication   Review of Systems Pulmonary review of systems negative    Objective:   Physical Exam  Well-developed and nourished, female no acute distress.  Examination, lung shows some crackles, right base, but the mostly gone.      Assessment & Plan:  [name] acquired pneumonia, resolving with Biaxin finished antibiotics.  Return p.r.n.

## 2011-04-23 LAB — BASIC METABOLIC PANEL
BUN: 5 — ABNORMAL LOW
BUN: 6
CO2: 25
CO2: 32
Calcium: 8.9
Calcium: 9.1
Chloride: 94 — ABNORMAL LOW
Chloride: 96
Creatinine, Ser: 0.75
Creatinine, Ser: 0.84
GFR calc Af Amer: >60
GFR calc Af Amer: >60
GFR calc non Af Amer: >60
GFR calc non Af Amer: >60
Glucose, Bld: 137 — ABNORMAL HIGH
Glucose, Bld: 187 — ABNORMAL HIGH
Potassium: 2.8 — ABNORMAL LOW
Potassium: 3.2 — ABNORMAL LOW
Sodium: 132 — ABNORMAL LOW
Sodium: 134 — ABNORMAL LOW

## 2011-04-23 LAB — DIFFERENTIAL
Basophils Absolute: 0.1
Basophils Relative: 1
Eosinophils Absolute: 0.4
Eosinophils Relative: 4
Lymphocytes Relative: 43
Lymphs Abs: 4.9 — ABNORMAL HIGH
Monocytes Absolute: 1.1 — ABNORMAL HIGH
Monocytes Relative: 9
Neutro Abs: 4.8
Neutrophils Relative %: 43

## 2011-04-23 LAB — CBC
HCT: 36.5
HCT: 36.8
HCT: 43.3
Hemoglobin: 12.6
Hemoglobin: 12.7
Hemoglobin: 14.9
MCHC: 34.1
MCHC: 34.5
MCHC: 34.8
MCV: 88
MCV: 88.9
MCV: 89.1
Platelets: 207
Platelets: 226
Platelets: 259
RBC: 4.14
RBC: 4.15
RBC: 4.87
RDW: 14.3
RDW: 14.4
RDW: 15.1
WBC: 11.4 — ABNORMAL HIGH
WBC: 12.2 — ABNORMAL HIGH
WBC: 15.4 — ABNORMAL HIGH

## 2011-04-23 LAB — CROSSMATCH
ABO/RH(D): A POS
Antibody Screen: POSITIVE
DAT, IgG: NEGATIVE
Donor AG Type: NEGATIVE
Donor AG Type: NEGATIVE

## 2011-04-23 LAB — POCT I-STAT 4, (NA,K, GLUC, HGB,HCT)
Glucose, Bld: 110 — ABNORMAL HIGH
HCT: 47 — ABNORMAL HIGH
Hemoglobin: 16 — ABNORMAL HIGH
Operator id: 206361
Potassium: 2.9 — ABNORMAL LOW
Sodium: 137

## 2011-04-23 LAB — COMPREHENSIVE METABOLIC PANEL
ALT: 20
AST: 19
Albumin: 3.8
Alkaline Phosphatase: 70
BUN: 20
CO2: 33 — ABNORMAL HIGH
Calcium: 10.4
Chloride: 96
Creatinine, Ser: 0.89
GFR calc Af Amer: >60
GFR calc non Af Amer: >60
Glucose, Bld: 76
Potassium: 3.3 — ABNORMAL LOW
Sodium: 138
Total Bilirubin: 0.6
Total Protein: 7.8

## 2011-04-23 LAB — URINALYSIS, ROUTINE W REFLEX MICROSCOPIC
Bilirubin Urine: NEGATIVE
Glucose, UA: NEGATIVE
Hgb urine dipstick: NEGATIVE
Ketones, ur: NEGATIVE
Nitrite: NEGATIVE
Protein, ur: NEGATIVE
Specific Gravity, Urine: 1.028
Urobilinogen, UA: 1
pH: 5.5

## 2011-04-23 LAB — PROTIME-INR
INR: 0.9
INR: 1.1
INR: 1.5
Prothrombin Time: 12.8
Prothrombin Time: 14.7
Prothrombin Time: 18.5 — ABNORMAL HIGH

## 2011-04-23 LAB — TYPE AND SCREEN
ABO/RH(D): A POS
Antibody Screen: POSITIVE

## 2011-04-23 LAB — APTT: aPTT: 25

## 2011-04-26 LAB — BASIC METABOLIC PANEL
BUN: 14
BUN: 7
CO2: 27
CO2: 29
Calcium: 9.2
Calcium: 9.4
Chloride: 96
Chloride: 97
Creatinine, Ser: 0.84
Creatinine, Ser: 0.88
GFR calc Af Amer: >60
GFR calc Af Amer: >60
GFR calc non Af Amer: >60
GFR calc non Af Amer: >60
Glucose, Bld: 113 — ABNORMAL HIGH
Glucose, Bld: 118 — ABNORMAL HIGH
Potassium: 2.9 — ABNORMAL LOW
Potassium: 3.1 — ABNORMAL LOW
Sodium: 135
Sodium: 135

## 2011-04-26 LAB — CBC
HCT: 38.5
HCT: 39.7
Hemoglobin: 13.2
Hemoglobin: 13.6
MCHC: 34.3
MCHC: 34.4
MCV: 88.7
MCV: 89.4
Platelets: 224
Platelets: 227
RBC: 4.34
RBC: 4.44
RDW: 14.3
RDW: 14.7
WBC: 15.4 — ABNORMAL HIGH
WBC: 17.2 — ABNORMAL HIGH

## 2011-04-26 LAB — PROTIME-INR
INR: 1.5
INR: 1.8 — ABNORMAL HIGH
Prothrombin Time: 18.6 — ABNORMAL HIGH
Prothrombin Time: 21.3 — ABNORMAL HIGH

## 2011-05-12 ENCOUNTER — Telehealth: Payer: Self-pay | Admitting: Family Medicine

## 2011-05-12 MED ORDER — CEPHALEXIN 500 MG PO CAPS: 500.0000 mg | ORAL_CAPSULE | Freq: Three times a day (TID) | ORAL | Status: AC

## 2011-05-12 NOTE — Telephone Encounter (Signed)
Pt had mammogram done at [facility] on 05-12-11. Pt was dx with a  new palpable area in right breast, likely infectious. Pt was told to call her PCP to initiate antibiotic therapy. Pt unable to come in due to going out of town. Pharm [facility] [address] [phone].

## 2011-05-12 NOTE — Telephone Encounter (Signed)
OK to start cephalexin 500 mg po tid for 10 days and pt needs follow up with primary in 2 weeks to reassess.

## 2011-05-12 NOTE — Telephone Encounter (Signed)
Pt informed

## 2011-05-24 ENCOUNTER — Encounter: Payer: Self-pay | Admitting: Family Medicine

## 2011-05-24 ENCOUNTER — Ambulatory Visit (INDEPENDENT_AMBULATORY_CARE_PROVIDER_SITE_OTHER): Payer: Medicare Other | Admitting: Family Medicine

## 2011-05-24 DIAGNOSIS — M069 Rheumatoid arthritis, unspecified: Secondary | ICD-10-CM

## 2011-05-24 NOTE — Progress Notes (Signed)
  Subjective:    Patient ID: [name], female    DOB: Dec 16, 1945, 65 y.o.   MRN: [id number]  HPI [name] is a 65 year old female, nonsmoker, who comes in today for evaluation of generalized muscle and joint pain for one week.  She otherwise feels well.  She is on no new medications.  However, she has been taking Zocor for a number years, and it could be a medicine related phenomena or it could be related to underlying arthritis.  She also had a boil on her right breast at the 6 o'clock position.  It was treated for him soaks and antibiotics and is feeling.   Review of Systems General musculoskeletal review of systems otherwise negative    Objective:   Physical Exam  Well-developed well-nourished, female, in no acute distress.  Examination of the right breast has a healing boil at the 6 o'clock position.  She has diffuse, generalized muscle and joint pain.      Assessment & Plan:  Generalized myalgias and arthralgias.  Question medication phenomena.  Plan DC Zocor, Motrin, 600 b.i.d. Return of symptoms persist.  Warm soaks and Band-Aid twice daily to the lesion on the right breast

## 2011-05-24 NOTE — Patient Instructions (Signed)
Warm soaks and apply a Band-Aid to the lesion.  On your right breast until it completely heals.  Stop the simvastatin and take Motrin two tabs twice daily with food.  If after two weeks.  Her symptoms persist, then will need to get your rheumatologist, involved

## 2011-05-27 ENCOUNTER — Ambulatory Visit: Payer: Medicare Other | Admitting: Family Medicine

## 2011-05-31 ENCOUNTER — Other Ambulatory Visit (INDEPENDENT_AMBULATORY_CARE_PROVIDER_SITE_OTHER): Payer: Medicare Other

## 2011-05-31 DIAGNOSIS — E119 Type 2 diabetes mellitus without complications: Secondary | ICD-10-CM

## 2011-05-31 LAB — BASIC METABOLIC PANEL
BUN: 21 mg/dL (ref 6–23)
CO2: 29 mEq/L (ref 19–32)
Calcium: 9.2 mg/dL (ref 8.4–10.5)
Chloride: 103 mEq/L (ref 96–112)
Creatinine, Ser: 1 mg/dL (ref 0.4–1.2)
GFR: 67.9 mL/min (ref >60.00)
Glucose, Bld: 91 mg/dL (ref 70–99)
Potassium: 3.2 mEq/L — ABNORMAL LOW (ref 3.5–5.1)
Sodium: 140 mEq/L (ref 135–145)

## 2011-05-31 LAB — HEMOGLOBIN A1C: Hgb A1c MFr Bld: 6 % (ref 4.6–6.5)

## 2011-06-07 ENCOUNTER — Ambulatory Visit (INDEPENDENT_AMBULATORY_CARE_PROVIDER_SITE_OTHER): Payer: Medicare Other | Admitting: Family Medicine

## 2011-06-07 ENCOUNTER — Encounter: Payer: Self-pay | Admitting: Family Medicine

## 2011-06-07 DIAGNOSIS — M791 Myalgia, unspecified site: Secondary | ICD-10-CM

## 2011-06-07 DIAGNOSIS — E119 Type 2 diabetes mellitus without complications: Secondary | ICD-10-CM

## 2011-06-07 DIAGNOSIS — IMO0001 Reserved for inherently not codable concepts without codable children: Secondary | ICD-10-CM

## 2011-06-07 DIAGNOSIS — I10 Essential (primary) hypertension: Secondary | ICD-10-CM

## 2011-06-07 MED ORDER — ATENOLOL 25 MG PO TABS: 25.0000 mg | ORAL_TABLET | Freq: Every day | ORAL | Status: DC

## 2011-06-07 NOTE — Progress Notes (Signed)
  Subjective:    Patient ID: [name], female    DOB: [birth year], 65 y.o.   MRN: [id number]  Guizar is a 65 year old female, who comes in today for evaluation of 3 problems.  She has a history of diabetes, type II, and is on metformin 500 mg daily, fasting blood sugar 91.  A1c6 .0%, which represents excellent control.  She is on Tenoretic 50 -- 25 dose one half tab daily BP 120/78.  She is also on losartan 25 mg dose one half tab daily.  BP normal.  However, serum potassium 3.2, and she doesn't like the oral potassium and cannot swallow the pills.  We will therefore give her plain beta-blocker.  A couple weeks ago.  She was complaining of muscle pain with therefore, stopped her Zocor and other muscle pain is gone.    Review of Systems General metabolic review of systems otherwise negative    Objective:   Physical Exam Well-developed well-nourished, female, in no acute distress.  BP right arm sitting position 120/80       Assessment & Plan:  Diabetes type 2, good control continue current therapy.  Hypertension, good control.  Hypokalemia.  DC Tenoretic, take plain beta-blocker.  Myalgias secondary to statin hold statin

## 2011-06-07 NOTE — Patient Instructions (Signed)
Take plain Atenolol 25 mg daily, and stop the Tenoretic.  Check your blood pressure daily in the morning for the next 3 weeks to be sure it stays normal.  Since y  blood sugars under excellent control.  I would just check a fasting blood sugar once weekly.  Hold the statin.  Return for your annual physical examination

## 2011-06-14 ENCOUNTER — Telehealth: Payer: Self-pay | Admitting: Family Medicine

## 2011-06-14 NOTE — Telephone Encounter (Signed)
Opened in Error.

## 2011-06-29 ENCOUNTER — Telehealth: Payer: Self-pay | Admitting: Family Medicine

## 2011-06-29 NOTE — Telephone Encounter (Signed)
Pt is coughing,has a runny nose, fever, headache, body ache,sneezing and is wanting to know what medication she can take please contact

## 2011-06-29 NOTE — Telephone Encounter (Signed)
Spoke with patient and she is going to try OTC and call back next week if not better

## 2011-08-04 ENCOUNTER — Telehealth: Payer: Self-pay | Admitting: Family Medicine

## 2011-08-04 NOTE — Telephone Encounter (Signed)
Blood pressure medication was changed at last appt pt was recently been experiencing High blood pressure and wants to know what she should do. If she needs to come in or if her medications should be switched back. Pt requesting to be contacted and if she does not answer is requesting a detailed message be left.

## 2011-08-05 ENCOUNTER — Ambulatory Visit (INDEPENDENT_AMBULATORY_CARE_PROVIDER_SITE_OTHER): Payer: Medicare Other | Admitting: Family Medicine

## 2011-08-05 ENCOUNTER — Encounter: Payer: Self-pay | Admitting: Family Medicine

## 2011-08-05 VITALS — BP 140/90 | Temp 98.1°F | Wt 211.0 lb

## 2011-08-05 DIAGNOSIS — I10 Essential (primary) hypertension: Secondary | ICD-10-CM

## 2011-08-05 MED ORDER — ATENOLOL 50 MG PO TABS: 50.0000 mg | ORAL_TABLET | Freq: Every day | ORAL | Status: DC

## 2011-08-05 NOTE — Progress Notes (Signed)
  Subjective:    Patient ID: [name], female    DOB: 07-05-1946, 66 y.o.   MRN: [id number]  HPI [name] is a 66 year old female, who comes in today with elevated blood pressure in the 140 to 150 range over 90, diastolic.  She is currently on 25 mg of atenolol daily.  She states for about taking her medication.  Also, blood sugar is normal.   Review of Systems General and cardiovascular his systems otherwise negative    Objective:   Physical Exam  Well-developed well-nourished, female, in no acute distress.  BP today here 140/90      Assessment & Plan:  Mild elevation of blood pressure increase beta-blocker to 50 mg daily.  Follow-up p.r.n.

## 2011-08-05 NOTE — Telephone Encounter (Signed)
Patient aware.

## 2011-08-05 NOTE — Patient Instructions (Signed)
Increase the Tenormin to 50 mg daily.  Check your blood pressure daily in the morning.  In 4 weeks, your blood pressure should be normal...........[name] Kitchen 135/85 or less.........[name] Kitchen If not return with the data and the device for follow-up

## 2011-08-05 NOTE — Telephone Encounter (Signed)
Come in today for blood pressure check

## 2011-08-18 ENCOUNTER — Telehealth: Payer: Self-pay | Admitting: Family Medicine

## 2011-08-18 DIAGNOSIS — M791 Myalgia, unspecified site: Secondary | ICD-10-CM

## 2011-08-18 DIAGNOSIS — M1990 Unspecified osteoarthritis, unspecified site: Secondary | ICD-10-CM

## 2011-08-18 NOTE — Telephone Encounter (Signed)
Pt called and said that Dr Tawanna Cooler gave pt the name of a rheumatologist that she could call and sch ov. When pt called the rheumatologist, she was told that she has to have a referral from Dr Tawanna Cooler and rcv some of there medical records prior to scheduling ov.   Also pt was taking Simvastatin, and pt doesn't know if she is suppose to still be taking this med or not. Pt said she had discontinued taking med, because she was having muscle aches and pains/sid effects, but med is still on pts med list. Pls clarify. Pt is not taking any med for cholesterol at this time.

## 2011-08-19 NOTE — Telephone Encounter (Signed)
Fleet Contras please have a Aurther Loft refer [name] to Dr. Azzie Roup rheumatologist for consult

## 2011-11-07 ENCOUNTER — Other Ambulatory Visit: Payer: Self-pay | Admitting: Family Medicine

## 2011-12-02 ENCOUNTER — Other Ambulatory Visit: Payer: Self-pay | Admitting: *Deleted

## 2011-12-02 DIAGNOSIS — K219 Gastro-esophageal reflux disease without esophagitis: Secondary | ICD-10-CM

## 2011-12-02 DIAGNOSIS — K222 Esophageal obstruction: Secondary | ICD-10-CM

## 2011-12-02 MED ORDER — PANTOPRAZOLE SODIUM 40 MG PO TBEC: 40.0000 mg | DELAYED_RELEASE_TABLET | Freq: Every day | ORAL | Status: DC

## 2011-12-06 ENCOUNTER — Other Ambulatory Visit (INDEPENDENT_AMBULATORY_CARE_PROVIDER_SITE_OTHER): Payer: Medicare Other

## 2011-12-06 DIAGNOSIS — E119 Type 2 diabetes mellitus without complications: Secondary | ICD-10-CM

## 2011-12-06 DIAGNOSIS — IMO0001 Reserved for inherently not codable concepts without codable children: Secondary | ICD-10-CM

## 2011-12-06 LAB — HEMOGLOBIN A1C: Hgb A1c MFr Bld: 5.9 % (ref 4.6–6.5)

## 2011-12-06 LAB — MICROALBUMIN / CREATININE URINE RATIO
Creatinine,U: 160.6 mg/dL
Microalb Creat Ratio: 9.3 mg/g (ref 0.0–30.0)
Microalb, Ur: 15 mg/dL — ABNORMAL HIGH (ref 0.0–1.9)

## 2011-12-06 LAB — BASIC METABOLIC PANEL
BUN: 18 mg/dL (ref 6–23)
CO2: 25 mEq/L (ref 19–32)
Calcium: 9.7 mg/dL (ref 8.4–10.5)
Chloride: 106 mEq/L (ref 96–112)
Creatinine, Ser: 1.2 mg/dL (ref 0.4–1.2)
GFR: 59.77 mL/min — ABNORMAL LOW (ref >60.00)
Glucose, Bld: 78 mg/dL (ref 70–99)
Potassium: 3.7 mEq/L (ref 3.5–5.1)
Sodium: 142 mEq/L (ref 135–145)

## 2011-12-06 LAB — POCT URINALYSIS DIPSTICK
Bilirubin, UA: NEGATIVE
Blood, UA: NEGATIVE
Glucose, UA: NEGATIVE
Ketones, UA: NEGATIVE
Leukocytes, UA: NEGATIVE
Nitrite, UA: NEGATIVE
Spec Grav, UA: 1.025
Urobilinogen, UA: 0.2
pH, UA: 5.5

## 2011-12-06 LAB — CBC WITH DIFFERENTIAL/PLATELET
Basophils Absolute: 0 10*3/uL (ref 0.0–0.1)
Basophils Relative: 0.8 % (ref 0.0–3.0)
Eosinophils Absolute: 0.3 10*3/uL (ref 0.0–0.7)
Eosinophils Relative: 4.6 % (ref 0.0–5.0)
HCT: 40 % (ref 36.0–46.0)
Hemoglobin: 13.4 g/dL (ref 12.0–15.0)
Lymphocytes Relative: 49.8 % — ABNORMAL HIGH (ref 12.0–46.0)
Lymphs Abs: 2.9 10*3/uL (ref 0.7–4.0)
MCHC: 33.5 g/dL (ref 30.0–36.0)
MCV: 90.3 fl (ref 78.0–100.0)
Monocytes Absolute: 0.4 10*3/uL (ref 0.1–1.0)
Monocytes Relative: 7.5 % (ref 3.0–12.0)
Neutro Abs: 2.2 10*3/uL (ref 1.4–7.7)
Neutrophils Relative %: 37.3 % — ABNORMAL LOW (ref 43.0–77.0)
Platelets: 202 10*3/uL (ref 150.0–400.0)
RBC: 4.43 Mil/uL (ref 3.87–5.11)
RDW: 15.4 % — ABNORMAL HIGH (ref 11.5–14.6)
WBC: 5.8 10*3/uL (ref 4.5–10.5)

## 2011-12-06 LAB — LIPID PANEL
Cholesterol: 265 mg/dL — ABNORMAL HIGH (ref 0–200)
HDL: 75.3 mg/dL (ref >39.00)
Total CHOL/HDL Ratio: 4
Triglycerides: 108 mg/dL (ref 0.0–149.0)
VLDL: 21.6 mg/dL (ref 0.0–40.0)

## 2011-12-06 LAB — LDL CHOLESTEROL, DIRECT: Direct LDL: 171.5 mg/dL

## 2011-12-06 LAB — HEPATIC FUNCTION PANEL
ALT: 22 U/L (ref 0–35)
AST: 27 U/L (ref 0–37)
Albumin: 3.9 g/dL (ref 3.5–5.2)
Alkaline Phosphatase: 54 U/L (ref 39–117)
Bilirubin, Direct: 0 mg/dL (ref 0.0–0.3)
Total Bilirubin: 0.7 mg/dL (ref 0.3–1.2)
Total Protein: 7.9 g/dL (ref 6.0–8.3)

## 2011-12-06 LAB — TSH: TSH: 2.17 u[IU]/mL (ref 0.35–5.50)

## 2011-12-13 ENCOUNTER — Encounter: Payer: Medicare Other | Admitting: Family Medicine

## 2011-12-16 ENCOUNTER — Ambulatory Visit (INDEPENDENT_AMBULATORY_CARE_PROVIDER_SITE_OTHER): Payer: Medicare Other | Admitting: Family Medicine

## 2011-12-16 ENCOUNTER — Encounter: Payer: Self-pay | Admitting: Family Medicine

## 2011-12-16 VITALS — BP 110/76 | Temp 98.0°F | Ht 66.0 in | Wt 218.0 lb

## 2011-12-16 DIAGNOSIS — K219 Gastro-esophageal reflux disease without esophagitis: Secondary | ICD-10-CM

## 2011-12-16 DIAGNOSIS — E039 Hypothyroidism, unspecified: Secondary | ICD-10-CM

## 2011-12-16 DIAGNOSIS — M069 Rheumatoid arthritis, unspecified: Secondary | ICD-10-CM

## 2011-12-16 DIAGNOSIS — Z23 Encounter for immunization: Secondary | ICD-10-CM

## 2011-12-16 DIAGNOSIS — K222 Esophageal obstruction: Secondary | ICD-10-CM

## 2011-12-16 DIAGNOSIS — Z0000 Encounter for general adult medical examination without abnormal findings: Secondary | ICD-10-CM

## 2011-12-16 DIAGNOSIS — E785 Hyperlipidemia, unspecified: Secondary | ICD-10-CM

## 2011-12-16 DIAGNOSIS — E119 Type 2 diabetes mellitus without complications: Secondary | ICD-10-CM

## 2011-12-16 DIAGNOSIS — I10 Essential (primary) hypertension: Secondary | ICD-10-CM

## 2011-12-16 DIAGNOSIS — J309 Allergic rhinitis, unspecified: Secondary | ICD-10-CM

## 2011-12-16 DIAGNOSIS — T7840XA Allergy, unspecified, initial encounter: Secondary | ICD-10-CM

## 2011-12-16 MED ORDER — LEVOTHYROXINE SODIUM 50 MCG PO TABS: 50.0000 ug | ORAL_TABLET | Freq: Every day | ORAL | Status: DC

## 2011-12-16 MED ORDER — PANTOPRAZOLE SODIUM 40 MG PO TBEC: 40.0000 mg | DELAYED_RELEASE_TABLET | Freq: Every day | ORAL | Status: DC

## 2011-12-16 MED ORDER — ALLOPURINOL 300 MG PO TABS: 300.0000 mg | ORAL_TABLET | Freq: Every day | ORAL | Status: DC

## 2011-12-16 MED ORDER — FLUTICASONE PROPIONATE 50 MCG/ACT NA SUSP: 2.0000 | Freq: Every day | NASAL | Status: DC

## 2011-12-16 MED ORDER — ATENOLOL 50 MG PO TABS: 50.0000 mg | ORAL_TABLET | Freq: Every day | ORAL | Status: DC

## 2011-12-16 MED ORDER — METFORMIN HCL 500 MG PO TABS: 500.0000 mg | ORAL_TABLET | Freq: Every day | ORAL | Status: DC

## 2011-12-16 MED ORDER — ATORVASTATIN CALCIUM 20 MG PO TABS: 20.0000 mg | ORAL_TABLET | Freq: Every day | ORAL | Status: DC

## 2011-12-16 NOTE — Patient Instructions (Signed)
Continue your current medications  The Lipitor 20 mg........... take a half a tablet Monday Wednesday Friday return in 2 months for followup  Nonfasting labs one week prior

## 2011-12-16 NOTE — Progress Notes (Signed)
  Subjective:    Patient ID: [name], female    DOB: 1946-07-23, 66 y.o.   MRN: [id number]  HPI [name]  is a 66 year old female who comes in today for general Medicare wellness examination because of a history of gout, hypertension, allergic rhinitis, hypothyroidism, diabetes type 2, reflux esophagitis, hyperlipidemia, autoimmune arthritis etiology unknown, history of renal cell carcinoma  Her medications reviewed and there've been no changes. She's on Plaquenil 400  milligrams daily and prednisone 5 mg daily by Dr. Dareen Piano her rheumatologist. She states her arthritis is under a median but not rheumatoid  She gets routine care, dental care, BSE monthly, and you mammography, colonoscopy and GI uterus removed for nonmalignant reasons therefore Pap not indicated  Cognitive function normal she walks on a daily basis home health safety reviewed no issues identified no guns in the house she does have a health care power of attorney and a living well  Tetanus 2003 Pneumovax 2008 information given on shingles    Review of Systems  Constitutional: Negative.   HENT: Negative.   Eyes: Negative.   Respiratory: Negative.   Cardiovascular: Negative.   Gastrointestinal: Negative.   Genitourinary: Negative.   Musculoskeletal: Negative.   Neurological: Negative.   Hematological: Negative.   Psychiatric/Behavioral: Negative.        Objective:   Physical Exam  Constitutional: She appears well-developed and well-nourished.  HENT:  Head: Normocephalic and atraumatic.  Right Ear: External ear normal.  Left Ear: External ear normal.  Nose: Nose normal.  Mouth/Throat: Oropharynx is clear and moist.  Eyes: EOM are normal. Pupils are equal, round, and reactive to light.  Neck: Normal range of motion. Neck supple. No thyromegaly present.  Cardiovascular: Normal rate, regular rhythm, normal heart sounds and intact distal pulses.  Exam reveals no gallop and no friction rub.   No murmur  heard. Pulmonary/Chest: Effort normal and breath sounds normal.  Abdominal: Soft. Bowel sounds are normal. She exhibits no distension and no mass. There is no tenderness. There is no rebound.  Genitourinary:       Bilateral breast exam normal except for a superficial lesion right breast consistent with a inclusion cyst it's not infected  Musculoskeletal: Normal range of motion.  Lymphadenopathy:    She has no cervical adenopathy.  Neurological: She is alert. She has normal reflexes. No cranial nerve deficit. She exhibits normal muscle tone. Coordination normal.  Skin: Skin is warm and dry.  Psychiatric: She has a normal mood and affect. Her behavior is normal. Judgment and thought content normal.          Assessment & Plan:  Autoimmune arthritis continue Plaquenil and prednisone via rheumatology Dr. Dareen Piano  History of gout continue allopurinol  Hypertension continue Tenormin  Allergic rhinitis continue steroid nasal spray  Hypothyroidism continue Synthroid 50 mcg daily TSH level normal  Diabetes type 1 continue metformin 1 tablet daily A1c at goal 5.9%  Reflux esophagitis with history of stricture continue Protonix 40 mg daily  Hyperlipidemia with LDL 171. Trial of Lipitor 10 mg 3 times weekly because of side effects of muscle pain joint pain from daily Zocor followup lipid panel and office visit in 2 months  Obesity diet exercise and weight loss

## 2011-12-24 ENCOUNTER — Other Ambulatory Visit: Payer: Self-pay | Admitting: *Deleted

## 2011-12-24 DIAGNOSIS — E039 Hypothyroidism, unspecified: Secondary | ICD-10-CM

## 2011-12-24 DIAGNOSIS — E119 Type 2 diabetes mellitus without complications: Secondary | ICD-10-CM

## 2011-12-24 MED ORDER — METFORMIN HCL 500 MG PO TABS: 500.0000 mg | ORAL_TABLET | Freq: Every day | ORAL | Status: DC

## 2011-12-24 MED ORDER — LEVOTHYROXINE SODIUM 50 MCG PO TABS: 50.0000 ug | ORAL_TABLET | Freq: Every day | ORAL | Status: DC

## 2011-12-28 LAB — HM DIABETES EYE EXAM

## 2012-01-06 ENCOUNTER — Encounter: Payer: Self-pay | Admitting: Family Medicine

## 2012-02-08 ENCOUNTER — Other Ambulatory Visit: Payer: Medicare Other

## 2012-02-15 ENCOUNTER — Ambulatory Visit: Payer: Medicare Other | Admitting: Family Medicine

## 2012-02-23 ENCOUNTER — Other Ambulatory Visit (INDEPENDENT_AMBULATORY_CARE_PROVIDER_SITE_OTHER): Payer: Medicare Other

## 2012-02-23 DIAGNOSIS — E785 Hyperlipidemia, unspecified: Secondary | ICD-10-CM

## 2012-02-23 LAB — HEPATIC FUNCTION PANEL
ALT: 21 U/L (ref 0–35)
AST: 25 U/L (ref 0–37)
Albumin: 3.8 g/dL (ref 3.5–5.2)
Alkaline Phosphatase: 58 U/L (ref 39–117)
Bilirubin, Direct: 0 mg/dL (ref 0.0–0.3)
Total Bilirubin: 0.7 mg/dL (ref 0.3–1.2)
Total Protein: 7.8 g/dL (ref 6.0–8.3)

## 2012-02-23 LAB — LIPID PANEL
Cholesterol: 157 mg/dL (ref 0–200)
HDL: 64.2 mg/dL (ref >39.00)
LDL Cholesterol: 77 mg/dL (ref 0–99)
Total CHOL/HDL Ratio: 2
Triglycerides: 81 mg/dL (ref 0.0–149.0)
VLDL: 16.2 mg/dL (ref 0.0–40.0)

## 2012-02-29 ENCOUNTER — Ambulatory Visit (INDEPENDENT_AMBULATORY_CARE_PROVIDER_SITE_OTHER): Payer: Medicare Other | Admitting: Family Medicine

## 2012-02-29 ENCOUNTER — Encounter: Payer: Self-pay | Admitting: Family Medicine

## 2012-02-29 VITALS — BP 140/98 | Temp 98.4°F | Wt 222.0 lb

## 2012-02-29 DIAGNOSIS — E119 Type 2 diabetes mellitus without complications: Secondary | ICD-10-CM

## 2012-02-29 DIAGNOSIS — E785 Hyperlipidemia, unspecified: Secondary | ICD-10-CM

## 2012-02-29 NOTE — Progress Notes (Signed)
  Subjective:    Patient ID: [name], female    DOB: 10-Jul-1946, 66 y.o.   MRN: [id number]  HPI [name] is a delightful 66 year old female who comes in today for followup of 2 problems  We saw her for her annual exam in the spring her lipids were not at goal we therefore started Lipitor 20 mg daily and after lipids at are at goal. No side effects to medication LFTs normal  Fasting blood sugar 80-90   Review of Systems    general and metabolic review of systems negative Objective:   Physical Exam Well-developed well-nourished female no acute distress       Assessment & Plan:  Hyperlipidemia goal continue current therapy  Diabetes type 2 continue current therapy followup may

## 2012-02-29 NOTE — Patient Instructions (Signed)
Continue your current medications  Return May 2014 for your annual exam

## 2012-03-01 ENCOUNTER — Other Ambulatory Visit (HOSPITAL_COMMUNITY): Payer: Self-pay | Admitting: Urology

## 2012-03-01 ENCOUNTER — Ambulatory Visit (HOSPITAL_COMMUNITY)
Admission: RE | Admit: 2012-03-01 | Discharge: 2012-03-01 | Disposition: A | Discharge status: Home / Self Care | Payer: Medicare Other | Source: Ambulatory Visit | Attending: Urology | Admitting: Urology

## 2012-03-01 DIAGNOSIS — C649 Malignant neoplasm of unspecified kidney, except renal pelvis: Secondary | ICD-10-CM

## 2012-03-01 DIAGNOSIS — I517 Cardiomegaly: Secondary | ICD-10-CM | POA: Insufficient documentation

## 2012-05-18 ENCOUNTER — Encounter: Payer: Self-pay | Admitting: Gastroenterology

## 2012-05-23 ENCOUNTER — Encounter: Payer: Self-pay | Admitting: Family Medicine

## 2012-05-23 ENCOUNTER — Telehealth: Payer: Self-pay | Admitting: Family Medicine

## 2012-05-23 ENCOUNTER — Ambulatory Visit (INDEPENDENT_AMBULATORY_CARE_PROVIDER_SITE_OTHER): Payer: Medicare Other | Admitting: Family Medicine

## 2012-05-23 VITALS — BP 124/80 | Temp 97.8°F | Wt 221.0 lb

## 2012-05-23 DIAGNOSIS — R0789 Other chest pain: Secondary | ICD-10-CM

## 2012-05-23 DIAGNOSIS — Z23 Encounter for immunization: Secondary | ICD-10-CM

## 2012-05-23 DIAGNOSIS — R071 Chest pain on breathing: Secondary | ICD-10-CM

## 2012-05-23 MED ORDER — TRAMADOL HCL 50 MG PO TABS: 50.0000 mg | ORAL_TABLET | Freq: Three times a day (TID) | ORAL | Status: DC | PRN

## 2012-05-23 NOTE — Telephone Encounter (Signed)
Please advise - possible work in.

## 2012-05-23 NOTE — Progress Notes (Signed)
  Subjective:    Patient ID: [name], female    DOB: 1945-11-19, 66 y.o.   MRN: [id number]  HPI [name] is a 66 year old female who comes in today for evaluation of chest pain  On Sunday of this week she noticed a gradual onset of a burning type pain in her posterior left upper back on the bra line. No skin rash no trauma she says it's ate a scale of 1-10 however the pain is intermittent it comes and goes. No cardiac or pulmonary symptoms   Review of Systems Cardiopulmonary and dermatologic review of systems negative    Objective:   Physical Exam Well-developed well nourished female no acute distress examination of lungs is normal examination skin is normal there is palpable tenderness in the left subscapular area but no skin rash       Assessment & Plan:  Chest wall pain treat symptomatically if rash appears call for antiviral therapy

## 2012-05-23 NOTE — Telephone Encounter (Signed)
Caller: [name]/Patient; Phone: [phone]; Reason for Call: Caller: [name]/Patient; Patient Name: [name]; PCP: Kelle Darting [facility]); Best Callback Phone Number: [phone]  Patient states she developed a "patch of bumps" on mid back "where bra strap fastens.  " States few areas noted on her side also, Onset 05/20/12.  States area is painful and has become progressively worse.  Describes as "burning" pain.  Denies blisters.  Denies blisters.  States fever, per tactile 05/22/12.  Denies fever 05/23/12.  Triage per Rash and Skin Lesions Protocol.  No emergent sx identified.  Care advice given per guidelines related to positive triage assessment for " Burning or tingling feedling in localized area on one side of body" and " Painful lesions.  " Patient advised avoid rubbing or scrathing area, tepid shower, loose fitting clothing.  Patient advised Ibuprofen 600mg .,with food, q 6 hours as needed for pain.  Call back parameters reviewed.  Patient verbalizes understanding.   NO APPTS.  AVAILABLE WITH DR.  TODD FOR 05/23/12.  PATIENT HAS PAINFUL SKIN LESIONS.  PLEASE RETURN CALL TO PATIENT AT [phone] REGARDING POSSIBLE WORK IN APPT.

## 2012-05-23 NOTE — Patient Instructions (Addendum)
Tramadol 50 mg,,,,,,,,, 1/2-1 tablet 3 times daily as needed for pain  If you see a rash call immediately

## 2012-05-23 NOTE — Telephone Encounter (Signed)
Please have her come in now for possible shingles - thanks

## 2012-05-25 ENCOUNTER — Encounter: Payer: Self-pay | Admitting: Family Medicine

## 2012-05-25 ENCOUNTER — Ambulatory Visit (INDEPENDENT_AMBULATORY_CARE_PROVIDER_SITE_OTHER): Payer: Medicare Other | Admitting: Family Medicine

## 2012-05-25 VITALS — BP 120/80 | HR 75 | Temp 98.4°F | Wt 223.0 lb

## 2012-05-25 DIAGNOSIS — B029 Zoster without complications: Secondary | ICD-10-CM

## 2012-05-25 MED ORDER — VALACYCLOVIR HCL 1 G PO TABS: 1000.0000 mg | ORAL_TABLET | Freq: Three times a day (TID) | ORAL | Status: DC

## 2012-05-25 NOTE — Progress Notes (Signed)
Chief Complaint  Patient presents with  . red bumps on back    noticed yesterday;     HPI:  [name] is a patient of Dr. Barbette Or whom I am meeting for the first time today for an acute visit for a skin concern.  Skin Rash: -started: 5 days ago -symptoms: redness and burning pain and bumps on back -saw PCP a few days ago and told it could be shingles, but no bumps at that time -denies: fevers, SOB, malaise, viral illness -new contact to soaps, lotions, clothing, ZOX:WRUE -recent outdoor activity or tick bite:none -new medications:none -hx of shingles  ROS: See pertinent positives and negatives per HPI.  Past Medical History  Diagnosis Date  . Allergy   . GERD (gastroesophageal reflux disease)   . Hypertension   . Arthritis   . Thyroid disease   . Osteoarthritis   . TIA (transient ischemic attack)   . Costochondritis   . S/P TKR (total knee replacement) 09  . S/P TAH (total abdominal hysterectomy)   . Total knee replacement status 09/2007    redo    Family History  Problem Relation Age of Onset  . Arthritis Mother     rheumatoid  . Diabetes      family hx  . Arthritis      History   Social History  . Marital Status: Divorced    Spouse Name: N/A    Number of Children: N/A  . Years of Education: N/A   Social History Main Topics  . Smoking status: Former Smoker -- 0.5 packs/day for 10 years    Types: Cigarettes    Quit date: 10/15/2008  . Smokeless tobacco: Never Used  . Alcohol Use: No  . Drug Use: No  . Sexually Active: None   Other Topics Concern  . None   Social History Narrative   SingleNever Smoked Alcohol use- noDrug use-noRegular Exercise-yesFormer Smoker- 12/2008    Current outpatient prescriptions:ACCU-CHEK AVIVA PLUS test strip, USE TO TEST BLOOD SUGAR DAILY, Disp: 100 strip, Rfl: 3;  allopurinol (ZYLOPRIM) 300 MG tablet, Take 1 tablet (300 mg total) by mouth daily., Disp: 100 tablet, Rfl: 3;  atenolol (TENORMIN) 50 MG tablet, Take 1 tablet (50  mg total) by mouth daily., Disp: 100 tablet, Rfl: 3;  atorvastatin (LIPITOR) 20 MG tablet, Take 1 tablet (20 mg total) by mouth daily., Disp: 90 tablet, Rfl: 3 fluticasone (FLONASE) 50 MCG/ACT nasal spray, Place 2 sprays into the nose daily., Disp: 16 g, Rfl: 11;  hydroxychloroquine (PLAQUENIL) 200 MG tablet, Take 200 mg by mouth daily. 2 tabs, Disp: , Rfl: ;  Lancets MISC, by Does not apply route.  , Disp: , Rfl: ;  levothyroxine (SYNTHROID, LEVOTHROID) 50 MCG tablet, Take 1 tablet (50 mcg total) by mouth daily., Disp: 100 tablet, Rfl: 3 metFORMIN (GLUCOPHAGE) 500 MG tablet, Take 1 tablet (500 mg total) by mouth daily with breakfast., Disp: 100 tablet, Rfl: 3;  pantoprazole (PROTONIX) 40 MG tablet, Take 1 tablet (40 mg total) by mouth daily., Disp: 100 tablet, Rfl: 3;  predniSONE (DELTASONE) 5 MG tablet, Take 5 mg by mouth daily., Disp: , Rfl: ;  traMADol (ULTRAM) 50 MG tablet, Take 1 tablet (50 mg total) by mouth every 8 (eight) hours as needed for pain., Disp: 50 tablet, Rfl: 1 valACYclovir (VALTREX) 1000 MG tablet, Take 1 tablet (1,000 mg total) by mouth 3 (three) times daily., Disp: 21 tablet, Rfl: 0  EXAM:  Filed Vitals:   05/25/12 1538  BP: 120/80  Pulse: 75  Temp: 98.4 F (36.9 C)    There is no height on file to calculate BMI.  GENERAL: vitals reviewed and listed above, alert, oriented, appears well hydrated and in no acute distress  SKIN: few erythematous papules and macules on L midback in dermatomal pattern  PSYCH: pleasant and cooperative, no obvious depression or anxiety  ASSESSMENT AND PLAN:  Discussed the following assessment and plan:  1. Shingles  valACYclovir (VALTREX) 1000 MG tablet   -has ultram to take for pain -valtrex therapy initiated after risks/benefits discussed -Patient advised to return or notify a doctor immediately if  new concerns arise.  Patient Instructions  Shingles Shingles is caused by the same virus that causes chickenpox (varicella zoster  virus or VZV). Shingles often occurs many years or decades after having chickenpox. That is why it is more common in adults older than 50 years. The virus reactivates and breaks out as an infection in a nerve root. SYMPTOMS   The initial feeling (sensations) may be pain. This pain is usually described as:  Burning.  Stabbing.  Throbbing.  Tingling in the nerve root.  A red rash will follow in a couple days. The rash may occur in any area of the body and is usually on one side (unilateral) of the body in a band or belt-like pattern. The rash usually starts out as very small blisters (vesicles). They will dry up after 7 to 10 days. This is not usually a significant problem except for the pain it causes.  Long-lasting (chronic) pain is more likely in an elderly person. It can last months to years. This condition is called postherpetic neuralgia. Shingles can be an extremely severe infection in someone with AIDS, a weakened immune system, or with forms of leukemia. It can also be severe if you are taking transplant medicines or other medicines that weaken the immune system. TREATMENT  Your caregiver will often treat you with:  Antiviral drugs.  Anti-inflammatory drugs.  Pain medicines. Bed rest is very important in preventing the pain associated with herpes zoster (postherpetic neuralgia). Application of heat in the form of a hot water bottle or electric heating pad or gentle pressure with the hand is recommended to help with the pain or discomfort. PREVENTION  A varicella zoster vaccine is available to help protect against the virus. The Food and Drug Administration approved the varicella zoster vaccine for individuals 91 years of age and older. HOME CARE INSTRUCTIONS   Cool compresses to the area of rash may be helpful.  Only take over-the-counter or prescription medicines for pain, discomfort, or fever as directed by your caregiver.  Avoid contact with:  Babies.  Pregnant  women.  Children with eczema.  Elderly people with transplants.  People with chronic illnesses, such as leukemia and AIDS.  If the area involved is on your face, you may receive a referral for follow-up to a specialist. It is very important to keep all follow-up appointments. This will help avoid eye complications, chronic pain, or disability. SEEK IMMEDIATE MEDICAL CARE IF:   You develop any pain (headache) in the area of the face or eye. This must be followed carefully by your caregiver or ophthalmologist. An infection in part of your eye (cornea) can be very serious. It could lead to blindness.  You do not have pain relief from prescribed medicines.  Your redness or swelling spreads.  The area involved becomes very swollen and painful.  You have a fever.  You notice any red or  painful lines extending away from the affected area toward your heart (lymphangitis).  Your condition is worsening or has changed. Document Released: 07/19/2005 Document Revised: 10/11/2011 Document Reviewed: 06/23/2009 [employer] [facility] [employer] Patient Information 2013 [place], [place], [place] R.

## 2012-05-25 NOTE — Patient Instructions (Signed)

## 2012-07-18 ENCOUNTER — Encounter: Payer: Self-pay | Admitting: Gastroenterology

## 2012-07-18 ENCOUNTER — Telehealth: Payer: Self-pay | Admitting: Family Medicine

## 2012-07-18 MED ORDER — HYDROCODONE-HOMATROPINE 5-1.5 MG/5ML PO SYRP: 5.0000 mL | ORAL_SOLUTION | Freq: Three times a day (TID) | ORAL | Status: AC | PRN

## 2012-07-18 NOTE — Telephone Encounter (Signed)
Pt has cough with congestion over a week no fever. [facility] [address]

## 2012-07-18 NOTE — Telephone Encounter (Signed)
Hydromet 4 ounces directions 1/2-1 teaspoon each bedtime when necessary for cough and cold refilled x1

## 2012-07-18 NOTE — Telephone Encounter (Signed)
Rx called to pharmacy and patient is aware

## 2012-08-08 ENCOUNTER — Ambulatory Visit: Payer: Medicare Other | Admitting: Family Medicine

## 2012-08-09 ENCOUNTER — Encounter: Payer: Self-pay | Admitting: Family Medicine

## 2012-08-09 ENCOUNTER — Ambulatory Visit (INDEPENDENT_AMBULATORY_CARE_PROVIDER_SITE_OTHER): Payer: Medicare Other | Admitting: Family Medicine

## 2012-08-09 VITALS — BP 130/90 | Temp 98.6°F | Wt 222.0 lb

## 2012-08-09 DIAGNOSIS — R071 Chest pain on breathing: Secondary | ICD-10-CM

## 2012-08-09 DIAGNOSIS — E119 Type 2 diabetes mellitus without complications: Secondary | ICD-10-CM

## 2012-08-09 DIAGNOSIS — R0789 Other chest pain: Secondary | ICD-10-CM

## 2012-08-09 LAB — BASIC METABOLIC PANEL
BUN: 18 mg/dL (ref 6–23)
CO2: 31 mEq/L (ref 19–32)
Calcium: 9.6 mg/dL (ref 8.4–10.5)
Chloride: 106 mEq/L (ref 96–112)
Creatinine, Ser: 1.2 mg/dL (ref 0.4–1.2)
GFR: 57.36 mL/min — ABNORMAL LOW (ref >60.00)
Glucose, Bld: 82 mg/dL (ref 70–99)
Potassium: 3.6 mEq/L (ref 3.5–5.1)
Sodium: 141 mEq/L (ref 135–145)

## 2012-08-09 LAB — HEMOGLOBIN A1C: Hgb A1c MFr Bld: 5.9 % (ref 4.6–6.5)

## 2012-08-09 MED ORDER — TRAMADOL HCL 50 MG PO TABS: 50.0000 mg | ORAL_TABLET | Freq: Three times a day (TID) | ORAL | Status: DC | PRN

## 2012-08-09 NOTE — Progress Notes (Signed)
  Subjective:    Patient ID: [name], female    DOB: [birth year], 67 y.o.   MRN: [id number]  HPI [name] is a 67 year old female nonsmoker who comes in today for evaluation of left-sided chest pain for 2 weeks  She states about a month ago she developed a viral syndrome with head congestion sore throat and cough. We cautery and some cough syrup and her cold resolved. However she's developed chest pain. She describes it is a sharp pain that comes and goes. When it comes on its about an 8. It does not radiate. She has no cardiac or pulmonary symptoms.  She has diabetes type 2 and takes metformin 500 mg daily last A1c in May was normal however since that time she's been put on prednisone 5 mg daily by a rheumatologist for rheumatoid arthritis. She states her blood sugars at home range from 80-90.    Review of Systems Gen. cardiopulmonary review of systems otherwise negative no history of trauma    Objective:   Physical Exam  Well-developed well-nourished female no acute distress cardiopulmonary exam normal right breast exam normal this palpable tenderness in the left eighth ninth and 10th ribs below the right breast      Assessment & Plan:  Chest wall pain secondary to viral syndrome plan treat symptomatically with tramadol one half tab twice a day return when necessary  Diabetes type 2 check labs

## 2012-08-09 NOTE — Patient Instructions (Signed)
Tramadol 50 mg,,,,,,,, one half tab twice daily until the chest wall pain is gone  Labs today,,,,,,,, we will call you tomorrow

## 2012-08-14 ENCOUNTER — Ambulatory Visit (AMBULATORY_SURGERY_CENTER): Payer: Medicare Other | Admitting: *Deleted

## 2012-08-14 VITALS — Ht 66.0 in | Wt 218.0 lb

## 2012-08-14 DIAGNOSIS — Z1211 Encounter for screening for malignant neoplasm of colon: Secondary | ICD-10-CM

## 2012-08-14 MED ORDER — SUPREP BOWEL PREP KIT 17.5-3.13-1.6 GM/177ML PO SOLN: ORAL | Status: DC

## 2012-08-28 ENCOUNTER — Encounter: Payer: Medicare Other | Admitting: Gastroenterology

## 2012-09-18 ENCOUNTER — Ambulatory Visit (INDEPENDENT_AMBULATORY_CARE_PROVIDER_SITE_OTHER): Payer: Medicare Other | Admitting: Gastroenterology

## 2012-09-18 ENCOUNTER — Encounter: Payer: Self-pay | Admitting: Gastroenterology

## 2012-09-18 VITALS — BP 152/90 | HR 80 | Ht 66.0 in | Wt 218.2 lb

## 2012-09-18 DIAGNOSIS — R195 Other fecal abnormalities: Secondary | ICD-10-CM

## 2012-09-18 DIAGNOSIS — R197 Diarrhea, unspecified: Secondary | ICD-10-CM

## 2012-09-18 DIAGNOSIS — K625 Hemorrhage of anus and rectum: Secondary | ICD-10-CM

## 2012-09-18 NOTE — Assessment & Plan Note (Signed)
Patient had one episode of very limited rectal bleeding. Suspect hemorrhoidal bleeding.  Recommendations #1 followup Hemoccults

## 2012-09-18 NOTE — Progress Notes (Signed)
History of Present Illness: [name] has returned for evaluation of change in bowel habits. For several months she has noticed postprandial urgency and passage of loose stools. She may have some very mild lower abdominal pain. She relates this as occurring after starting Plaquenil for rheumatoid arthritis. On one occasion she saw some blood on the toilet tissue. Last colonoscopy 2008 demonstrated hyperplastic polyps.    Past Medical History  Diagnosis Date  . Allergy   . GERD (gastroesophageal reflux disease)   . Hypertension   . Thyroid disease   . TIA (transient ischemic attack)   . Costochondritis   . S/P TKR (total knee replacement) 09  . S/P TAH (total abdominal hysterectomy)   . Total knee replacement status 09/2007    redo  . Arthritis   . Osteoarthritis   . Diabetes mellitus without complication     type 2   Past Surgical History  Procedure Laterality Date  . Abdominal hysterectomy  1978  . Right tkr    . Hnp    . Nephrectomy  2010    10.rcc right cancer   family history includes Arthritis in her mother and unspecified family member; Diabetes in an unspecified family member; Heart disease in her father; and Prostate cancer in her father. Current Outpatient Prescriptions  Medication Sig Dispense Refill  . ACCU-CHEK AVIVA PLUS test strip USE TO TEST BLOOD SUGAR DAILY  100 strip  3  . allopurinol (ZYLOPRIM) 300 MG tablet Take 1 tablet (300 mg total) by mouth daily.  100 tablet  3  . atenolol (TENORMIN) 50 MG tablet Take 1 tablet (50 mg total) by mouth daily.  100 tablet  3  . atorvastatin (LIPITOR) 20 MG tablet Take 1 tablet (20 mg total) by mouth daily.  90 tablet  3  . fluticasone (FLONASE) 50 MCG/ACT nasal spray Place 2 sprays into the nose daily.  16 g  11  . hydroxychloroquine (PLAQUENIL) 200 MG tablet Take 200 mg by mouth daily. 2 tabs      . Lancets MISC by Does not apply route.        . leflunomide (ARAVA) 10 MG tablet Take 10 mg by mouth daily.      [name] Kitchen  levothyroxine (SYNTHROID, LEVOTHROID) 50 MCG tablet Take 1 tablet (50 mcg total) by mouth daily.  100 tablet  3  . metFORMIN (GLUCOPHAGE) 500 MG tablet Take 1 tablet (500 mg total) by mouth daily with breakfast.  100 tablet  3  . pantoprazole (PROTONIX) 40 MG tablet Take 1 tablet (40 mg total) by mouth daily.  100 tablet  3  . predniSONE (DELTASONE) 5 MG tablet Take 5 mg by mouth daily.      [name] BOWEL PREP SOLN SUPREP take as directed no substitution  354 mL  0  . traMADol (ULTRAM) 50 MG tablet Take 1 tablet (50 mg total) by mouth every 8 (eight) hours as needed for pain.  50 tablet  1   No current facility-administered medications for this visit.   Allergies as of 09/18/2012 - Review Complete 09/18/2012  Allergen Reaction Noted  . Aspirin    . Codeine  02/27/2007    reports that she has been smoking Cigarettes.  She has a 3 pack-year smoking history. She has never used smokeless tobacco. She reports that she does not drink alcohol or use illicit drugs.     Review of Systems: Pertinent positive and negative review of systems were noted in the above HPI section. All other  review of systems were otherwise negative.  Vital signs were reviewed in today's medical record Physical Exam: General: Well developed , well nourished, no acute distress Skin: anicteric Head: Normocephalic and atraumatic Eyes:  sclerae anicteric, EOMI Ears: Normal auditory acuity Mouth: No deformity or lesions Neck: Supple, no masses or thyromegaly Lungs: Clear throughout to auscultation Heart: Regular rate and rhythm; no murmurs, rubs or bruits Abdomen: Soft, non tender and non distended. No masses, hepatosplenomegaly or hernias noted. Normal Bowel sounds Rectal:deferred Musculoskeletal: Symmetrical with no gross deformities  Skin: No lesions on visible extremities Pulses:  Normal pulses noted Extremities: No clubbing, cyanosis, edema or deformities noted Neurological: Alert oriented x 4, grossly  nonfocal Cervical Nodes:  No significant cervical adenopathy Inguinal Nodes: No significant inguinal adenopathy Psychological:  Alert and cooperative. Normal mood and affect

## 2012-09-18 NOTE — Patient Instructions (Addendum)
You have been given a separate informational sheet regarding your tobacco use, the importance of quitting and local resources to help you quit. Please go to the basement today for your hemoccult kit

## 2012-09-18 NOTE — Assessment & Plan Note (Addendum)
Patient has new onset diarrhea of several months duration coincident with starting plaquenil. Medicines certainly may be the etiology.  Recommendations #1 hold plaquenel 5-7 days; if not improved I would consider further study by colonoscopy

## 2012-11-06 ENCOUNTER — Telehealth: Payer: Self-pay | Admitting: Family Medicine

## 2012-11-06 NOTE — Telephone Encounter (Signed)
From Call a Nurse - please advise re appt

## 2012-11-06 NOTE — Telephone Encounter (Signed)
Spoke with patient and she will follow the [employer] and call back as needed.

## 2012-11-06 NOTE — Telephone Encounter (Signed)
Patient Information:  Caller Name: Kaytlynn  Phone: [phone]  Patient: [name], [name]  Gender: Female  DOB: 01/21/1947  Age: 67 Years  PCP: Kelle Darting [facility])  Office Follow Up:  Does the office need to follow up with this patient?: Yes  Instructions For The Office: Please call to advise if can be worked in for late PM appointment 11/06/12.  RN Note:  Has not tested blood sugar since 11/02/12.  Random blood sugar 90 at 1530. Feels fatigued and mild weakness. No appointments remain with Dr Tawanna Cooler. If MD approves, prefers appointment for 11/07/12 due to transportation issues.  Agreed to check to see if daughter available to drive to office today.  Please call back to advise if can be worked in today.  Symptoms  Reason For Call & Symptoms: Nausea and  mild diarrhea.  Vomiting ended 11/05/12  Reviewed Health History In EMR: Yes  Reviewed Medications In EMR: Yes  Reviewed Allergies In EMR: Yes  Reviewed Surgeries / Procedures: Yes  Date of Onset of Symptoms: 11/03/2012  Treatments Tried: Water, Gatorade, Sugar free Ginger Ale, bland food, broth, Immodium  Treatments Tried Worked: Yes  Guideline(s) Used:  Diarrhea  Disposition Per Guideline:   Go to Office Now  Reason For Disposition Reached:   Age > 60 years and has had > 6 diarrhea stools in past 24 hours  Advice Given:  Reassurance:  In healthy adults, new-onset diarrhea is usually caused by a viral infection of the intestines, which you can treat at home. Diarrhea is the body's way of getting rid of the infection. Here are some tips on how to keep ahead of the fluid losses.  Here is some care advice that should help.  Fluids:  Drink more fluids, at least 8-10 glasses (8 oz or 240 ml) daily.  For example: sports drinks, diluted fruit juices, soft drinks.  Supplement this with saltine crackers or soups to make certain that you are getting sufficient fluid and salt to meet your body's needs.  Nutrition:  Maintaining some food  intake during episodes of diarrhea is important.  Ideal initial foods include boiled starches/cereals (e.g., potatoes, rice, noodles, wheat, oats) with a small amount of salt to taste.  Other acceptable foods include: bananas, yogurt, crackers, soup.  As your stools return to normal consistency, resume a normal diet.  Diarrhea Medication  - Imodium AD:   Helps reduce diarrhea.  Adult dosage: 4 mg (2 capsules or 4 teaspoons or 20 ml) is the recommended first dose. You may take an additional 2 mg (1 capsule or 2 teaspoons or 10 ml) after each loose BM.  Caution: Do not use if you have a fever greater than 100F (37.8C). Do not use if there is blood or mucus in your stools. Do not use for more than 2 days.  Expected Course:  Viral diarrhea lasts 4-7 days. Always worse on days 1 and 2.  Call Back If:  Signs of dehydration occur (e.g., no urine for more than 12 hours, very dry mouth, lightheaded, etc.)  Diarrhea lasts over 7 days  You become worse.  Patient Will Follow Care Advice:  YES

## 2012-12-11 ENCOUNTER — Other Ambulatory Visit (INDEPENDENT_AMBULATORY_CARE_PROVIDER_SITE_OTHER): Payer: Medicare Other

## 2012-12-11 ENCOUNTER — Other Ambulatory Visit: Payer: Self-pay | Admitting: *Deleted

## 2012-12-11 DIAGNOSIS — K222 Esophageal obstruction: Secondary | ICD-10-CM

## 2012-12-11 DIAGNOSIS — K219 Gastro-esophageal reflux disease without esophagitis: Secondary | ICD-10-CM

## 2012-12-11 DIAGNOSIS — R195 Other fecal abnormalities: Secondary | ICD-10-CM

## 2012-12-11 DIAGNOSIS — E785 Hyperlipidemia, unspecified: Secondary | ICD-10-CM

## 2012-12-11 DIAGNOSIS — E119 Type 2 diabetes mellitus without complications: Secondary | ICD-10-CM

## 2012-12-11 LAB — POCT URINALYSIS DIPSTICK
Bilirubin, UA: NEGATIVE
Blood, UA: NEGATIVE
Glucose, UA: NEGATIVE
Ketones, UA: NEGATIVE
Leukocytes, UA: NEGATIVE
Nitrite, UA: NEGATIVE
Spec Grav, UA: 1.025
Urobilinogen, UA: 0.2
pH, UA: 5.5

## 2012-12-11 LAB — CBC WITH DIFFERENTIAL/PLATELET
Basophils Absolute: 0.1 10*3/uL (ref 0.0–0.1)
Basophils Relative: 1.1 % (ref 0.0–3.0)
Eosinophils Absolute: 0.2 10*3/uL (ref 0.0–0.7)
Eosinophils Relative: 4.3 % (ref 0.0–5.0)
HCT: 39.8 % (ref 36.0–46.0)
Hemoglobin: 13.5 g/dL (ref 12.0–15.0)
Lymphocytes Relative: 42.6 % (ref 12.0–46.0)
Lymphs Abs: 2.5 10*3/uL (ref 0.7–4.0)
MCHC: 33.9 g/dL (ref 30.0–36.0)
MCV: 90.1 fl (ref 78.0–100.0)
Monocytes Absolute: 0.5 10*3/uL (ref 0.1–1.0)
Monocytes Relative: 9.4 % (ref 3.0–12.0)
Neutro Abs: 2.5 10*3/uL (ref 1.4–7.7)
Neutrophils Relative %: 42.6 % — ABNORMAL LOW (ref 43.0–77.0)
Platelets: 175 10*3/uL (ref 150.0–400.0)
RBC: 4.42 Mil/uL (ref 3.87–5.11)
RDW: 14.5 % (ref 11.5–14.6)
WBC: 5.9 10*3/uL (ref 4.5–10.5)

## 2012-12-11 LAB — HEPATIC FUNCTION PANEL
ALT: 23 U/L (ref 0–35)
AST: 25 U/L (ref 0–37)
Albumin: 3.6 g/dL (ref 3.5–5.2)
Alkaline Phosphatase: 57 U/L (ref 39–117)
Bilirubin, Direct: 0.1 mg/dL (ref 0.0–0.3)
Total Bilirubin: 0.7 mg/dL (ref 0.3–1.2)
Total Protein: 7.6 g/dL (ref 6.0–8.3)

## 2012-12-11 LAB — LIPID PANEL
Cholesterol: 176 mg/dL (ref 0–200)
HDL: 60.8 mg/dL (ref >39.00)
LDL Cholesterol: 97 mg/dL (ref 0–99)
Total CHOL/HDL Ratio: 3
Triglycerides: 89 mg/dL (ref 0.0–149.0)
VLDL: 17.8 mg/dL (ref 0.0–40.0)

## 2012-12-11 LAB — MICROALBUMIN / CREATININE URINE RATIO
Creatinine,U: 180 mg/dL
Microalb Creat Ratio: 36.1 mg/g — ABNORMAL HIGH (ref 0.0–30.0)
Microalb, Ur: 64.9 mg/dL — ABNORMAL HIGH (ref 0.0–1.9)

## 2012-12-11 LAB — BASIC METABOLIC PANEL
BUN: 14 mg/dL (ref 6–23)
CO2: 26 mEq/L (ref 19–32)
Calcium: 9.2 mg/dL (ref 8.4–10.5)
Chloride: 107 mEq/L (ref 96–112)
Creatinine, Ser: 1 mg/dL (ref 0.4–1.2)
GFR: 67.59 mL/min (ref >60.00)
Glucose, Bld: 91 mg/dL (ref 70–99)
Potassium: 3.4 mEq/L — ABNORMAL LOW (ref 3.5–5.1)
Sodium: 141 mEq/L (ref 135–145)

## 2012-12-11 LAB — TSH: TSH: 1.18 u[IU]/mL (ref 0.35–5.50)

## 2012-12-11 LAB — HEMOGLOBIN A1C: Hgb A1c MFr Bld: 6 % (ref 4.6–6.5)

## 2012-12-11 MED ORDER — PANTOPRAZOLE SODIUM 40 MG PO TBEC: 40.0000 mg | DELAYED_RELEASE_TABLET | Freq: Every day | ORAL | Status: DC

## 2012-12-18 ENCOUNTER — Encounter: Payer: Self-pay | Admitting: Family Medicine

## 2012-12-18 ENCOUNTER — Ambulatory Visit (INDEPENDENT_AMBULATORY_CARE_PROVIDER_SITE_OTHER): Payer: Medicare Other | Admitting: Family Medicine

## 2012-12-18 VITALS — BP 140/80 | Temp 98.3°F | Ht 66.0 in | Wt 217.0 lb

## 2012-12-18 DIAGNOSIS — Z86010 Personal history of colonic polyps: Secondary | ICD-10-CM

## 2012-12-18 DIAGNOSIS — K222 Esophageal obstruction: Secondary | ICD-10-CM

## 2012-12-18 DIAGNOSIS — I10 Essential (primary) hypertension: Secondary | ICD-10-CM

## 2012-12-18 DIAGNOSIS — C649 Malignant neoplasm of unspecified kidney, except renal pelvis: Secondary | ICD-10-CM

## 2012-12-18 DIAGNOSIS — R0789 Other chest pain: Secondary | ICD-10-CM

## 2012-12-18 DIAGNOSIS — E785 Hyperlipidemia, unspecified: Secondary | ICD-10-CM

## 2012-12-18 DIAGNOSIS — E119 Type 2 diabetes mellitus without complications: Secondary | ICD-10-CM

## 2012-12-18 DIAGNOSIS — K219 Gastro-esophageal reflux disease without esophagitis: Secondary | ICD-10-CM

## 2012-12-18 DIAGNOSIS — J309 Allergic rhinitis, unspecified: Secondary | ICD-10-CM

## 2012-12-18 DIAGNOSIS — M109 Gout, unspecified: Secondary | ICD-10-CM

## 2012-12-18 DIAGNOSIS — E039 Hypothyroidism, unspecified: Secondary | ICD-10-CM

## 2012-12-18 DIAGNOSIS — R071 Chest pain on breathing: Secondary | ICD-10-CM

## 2012-12-18 DIAGNOSIS — M069 Rheumatoid arthritis, unspecified: Secondary | ICD-10-CM

## 2012-12-18 LAB — FECAL OCCULT BLOOD, IMMUNOCHEMICAL: Fecal Occult Bld: NEGATIVE

## 2012-12-18 MED ORDER — PANTOPRAZOLE SODIUM 40 MG PO TBEC: 40.0000 mg | DELAYED_RELEASE_TABLET | Freq: Every day | ORAL | Status: DC

## 2012-12-18 MED ORDER — METFORMIN HCL 500 MG PO TABS: 500.0000 mg | ORAL_TABLET | Freq: Every day | ORAL | Status: DC

## 2012-12-18 MED ORDER — LEVOTHYROXINE SODIUM 50 MCG PO TABS: 50.0000 ug | ORAL_TABLET | Freq: Every day | ORAL | Status: DC

## 2012-12-18 MED ORDER — TRAMADOL HCL 50 MG PO TABS: 50.0000 mg | ORAL_TABLET | Freq: Three times a day (TID) | ORAL | Status: DC | PRN

## 2012-12-18 MED ORDER — ALLOPURINOL 300 MG PO TABS: 300.0000 mg | ORAL_TABLET | Freq: Every day | ORAL | Status: DC

## 2012-12-18 MED ORDER — ATORVASTATIN CALCIUM 20 MG PO TABS: 20.0000 mg | ORAL_TABLET | Freq: Every day | ORAL | Status: DC

## 2012-12-18 MED ORDER — ATENOLOL 50 MG PO TABS: 50.0000 mg | ORAL_TABLET | Freq: Every day | ORAL | Status: DC

## 2012-12-18 NOTE — Patient Instructions (Signed)
Call today and join the Y.,,,,,,,,,,,, the most important thing you can do for your joints and your overall health is to exercise daily  Continue your current medications  Followup on your diabetes in 6 months  Labs one week prior nonfasting

## 2012-12-18 NOTE — Progress Notes (Signed)
  Subjective:    Patient ID: [name], female    DOB: 11/10/1945, 67 y.o.   MRN: [id number]  HPI [name] is a 67 year old female nonsmoker who comes in today for a Medicare wellness examination because of a history of gout, hypertension, hyperlipidemia, allergic rhinitis, rheumatoid arthritis, hypothyroidism, diabetes, reflux esophagitis with a history of an esophageal stricture, and chronic pain from rheumatoid arthritis  Her medications reviewed there've been no changes except she's only taking one tramadol daily. She states that Dr. Dareen Piano stopped one of her arthritis medicines but she doesn't remember which one.  She has silver sneakers program with her health insurance but is not doing any exercise. Her weight is up to 217. I explained to her that the most important thing she could do for her overall health and energy level was begin an exercise program  She gets routine eye care, dental care, BSE monthly, and you mammography, colonoscopy and GI, vaccinations up-to-date except she needs shingles vaccine. Information given.  Cognitive function normal she does not exercise, home health safety reviewed no issues identified, no guns in the house, she does have a health care power of attorney and living will   Review of Systems  Constitutional: Negative.   HENT: Negative.   Eyes: Negative.   Respiratory: Negative.   Cardiovascular: Negative.   Gastrointestinal: Negative.   Genitourinary: Negative.   Musculoskeletal: Negative.   Neurological: Negative.   Psychiatric/Behavioral: Negative.        Objective:   Physical Exam  Constitutional: She appears well-developed and well-nourished.  HENT:  Head: Normocephalic and atraumatic.  Right Ear: External ear normal.  Left Ear: External ear normal.  Nose: Nose normal.  Mouth/Throat: Oropharynx is clear and moist.  Eyes: EOM are normal. Pupils are equal, round, and reactive to light.  Neck: Normal range of motion. Neck supple. No  thyromegaly present.  Cardiovascular: Normal rate, regular rhythm, normal heart sounds and intact distal pulses.  Exam reveals no gallop and no friction rub.   No murmur heard. Pulmonary/Chest: Effort normal and breath sounds normal.  Abdominal: Soft. Bowel sounds are normal. She exhibits no distension and no mass. There is no tenderness. There is no rebound.  Genitourinary: Vagina normal. Guaiac negative stool. No vaginal discharge found.  Uterus has been surgically removed  Bilateral breast exam normal  Musculoskeletal: Normal range of motion.  Lymphadenopathy:    She has no cervical adenopathy.  Neurological: She is alert. She has normal reflexes. No cranial nerve deficit. She exhibits normal muscle tone. Coordination normal.  Skin: Skin is warm and dry.  Total body skin exam normal except for scar bid lumbar spine from previous back surgery  Psychiatric: She has a normal mood and affect. Her behavior is normal. Judgment and thought content normal.          Assessment & Plan:  Healthy female  History of gout continue allopurinol 300 mg daily  History of hypertension continue Tenormin 50 mg daily  History of hyperlipidemia continue Lipitor 20 mg daily  Allergic rhinitis over-the-counter steroid nasal spray  Rheumatoid arthritis followed by Dr. Dareen Piano  Hypothyroidism continue Synthroid 50 mcg daily  Diabetes type 2 continue metformin 500 mg daily  Reflux esophagitis with a history of esophageal stricture continue diet and Protonix  Chronic pain from rheumatoid arthritis and degenerative disc disease,,,,,,,, tramadol 50 daily joining the Y. And begin an exercise program

## 2012-12-27 ENCOUNTER — Ambulatory Visit (INDEPENDENT_AMBULATORY_CARE_PROVIDER_SITE_OTHER): Payer: Medicare Other | Admitting: *Deleted

## 2012-12-27 DIAGNOSIS — Z23 Encounter for immunization: Secondary | ICD-10-CM

## 2012-12-27 DIAGNOSIS — Z2911 Encounter for prophylactic immunotherapy for respiratory syncytial virus (RSV): Secondary | ICD-10-CM

## 2013-01-08 LAB — HM DIABETES EYE EXAM

## 2013-01-09 ENCOUNTER — Other Ambulatory Visit: Payer: Self-pay | Admitting: Neurosurgery

## 2013-01-09 ENCOUNTER — Ambulatory Visit
Admission: RE | Admit: 2013-01-09 | Discharge: 2013-01-09 | Disposition: A | Discharge status: Home / Self Care | Payer: Medicare Other | Source: Ambulatory Visit | Attending: Neurosurgery | Admitting: Neurosurgery

## 2013-01-09 DIAGNOSIS — M5416 Radiculopathy, lumbar region: Secondary | ICD-10-CM

## 2013-01-09 MED ORDER — GADOBENATE DIMEGLUMINE 529 MG/ML IV SOLN
19.0000 mL | Freq: Once | INTRAVENOUS | Status: AC | PRN
  Administered 2013-01-09: 19 mL via INTRAVENOUS

## 2013-01-11 ENCOUNTER — Encounter: Payer: Self-pay | Admitting: Family Medicine

## 2013-01-23 ENCOUNTER — Other Ambulatory Visit: Payer: Self-pay | Admitting: Neurosurgery

## 2013-01-29 ENCOUNTER — Encounter (HOSPITAL_COMMUNITY): Payer: Self-pay | Admitting: Pharmacy Technician

## 2013-02-06 ENCOUNTER — Other Ambulatory Visit: Payer: Self-pay | Admitting: Family Medicine

## 2013-02-06 NOTE — Telephone Encounter (Signed)
Pt needs testing strips for  ACCU Check. Pt 's previous RX has expired. Pharm: [facility]/ [address]

## 2013-02-07 ENCOUNTER — Encounter (HOSPITAL_COMMUNITY): Payer: Self-pay

## 2013-02-07 ENCOUNTER — Encounter (HOSPITAL_COMMUNITY)
Admission: RE | Admit: 2013-02-07 | Discharge: 2013-02-07 | Disposition: A | Discharge status: Home / Self Care | Payer: Medicare Other | Source: Ambulatory Visit | Attending: Neurosurgery | Admitting: Neurosurgery

## 2013-02-07 DIAGNOSIS — Z01812 Encounter for preprocedural laboratory examination: Secondary | ICD-10-CM | POA: Insufficient documentation

## 2013-02-07 DIAGNOSIS — M48062 Spinal stenosis, lumbar region with neurogenic claudication: Secondary | ICD-10-CM | POA: Insufficient documentation

## 2013-02-07 HISTORY — DX: Pneumonia, unspecified organism: J18.9

## 2013-02-07 LAB — BASIC METABOLIC PANEL
BUN: 13 mg/dL (ref 6–23)
CO2: 28 mEq/L (ref 19–32)
Calcium: 10.2 mg/dL (ref 8.4–10.5)
Chloride: 105 mEq/L (ref 96–112)
Creatinine, Ser: 1.15 mg/dL — ABNORMAL HIGH (ref 0.50–1.10)
GFR calc Af Amer: 55 mL/min — ABNORMAL LOW (ref >90)
GFR calc non Af Amer: 47 mL/min — ABNORMAL LOW (ref >90)
Glucose, Bld: 86 mg/dL (ref 70–99)
Potassium: 4.4 mEq/L (ref 3.5–5.1)
Sodium: 141 mEq/L (ref 135–145)

## 2013-02-07 LAB — CBC WITH DIFFERENTIAL/PLATELET
Basophils Absolute: 0.1 10*3/uL (ref 0.0–0.1)
Basophils Relative: 1 % (ref 0–1)
Eosinophils Absolute: 0.3 10*3/uL (ref 0.0–0.7)
Eosinophils Relative: 4 % (ref 0–5)
HCT: 39.9 % (ref 36.0–46.0)
Hemoglobin: 13.8 g/dL (ref 12.0–15.0)
Lymphocytes Relative: 45 % (ref 12–46)
Lymphs Abs: 3.1 10*3/uL (ref 0.7–4.0)
MCH: 30.9 pg (ref 26.0–34.0)
MCHC: 34.6 g/dL (ref 30.0–36.0)
MCV: 89.5 fL (ref 78.0–100.0)
Monocytes Absolute: 0.8 10*3/uL (ref 0.1–1.0)
Monocytes Relative: 12 % (ref 3–12)
Neutro Abs: 2.6 10*3/uL (ref 1.7–7.7)
Neutrophils Relative %: 38 % — ABNORMAL LOW (ref 43–77)
Platelets: 190 10*3/uL (ref 150–400)
RBC: 4.46 MIL/uL (ref 3.87–5.11)
RDW: 14 % (ref 11.5–15.5)
WBC: 6.8 10*3/uL (ref 4.0–10.5)

## 2013-02-07 LAB — SURGICAL PCR SCREEN
MRSA, PCR: NEGATIVE
Staphylococcus aureus: NEGATIVE

## 2013-02-07 NOTE — Progress Notes (Signed)
Pt denies SOB, chest pain, being under the care of a cardiologist and having any cardiac test done ( echo, stress, and cardiac cath). Revonda Standard , [place] ( anesthesia) to review pt chest x ray results dated 03/01/12.

## 2013-02-07 NOTE — Pre-Procedure Instructions (Signed)
[name]  02/07/2013   Your procedure is scheduled on: Monday, February 12, 2013  Report to [facility] at 5:30 AM.  Call this number if you have problems the morning of surgery: [phone]   Remember:   Do not eat food or drink liquids after midnight.   Take these medicines the morning of surgery with A SIP OF WATER: atenolol (TENORMIN) 50 MG tablet,  levothyroxine (SYNTHROID, LEVOTHROID) 50 MCG tablet, pantoprazole (PROTONIX) 40 MG tablet, leflunomide (ARAVA) 10 MG tablet, fluticasone (FLONASE) 50 MCG/ACT nasal spray,  if needed: traMADol (ULTRAM) 50 MG tablet for pain Stop taking Aspirin and herbal medications. Do not take any NSAIDs ie: Ibuprofen, Advil, Naproxen or any medication containing Aspirin.   Do not wear jewelry, make-up or nail polish.  Do not wear lotions, powders, or perfumes. You may wear deodorant.  Do not shave 48 hours prior to surgery. Men may shave face and neck.  Do not bring valuables to the hospital.  [facility] is not responsible  for any belongings or valuables.  Contacts, dentures or bridgework may not be worn into surgery.  Leave suitcase in the car. After surgery it may be brought to your room.  For patients admitted to the hospital, checkout time is 11:00 AM the day of discharge.   Patients discharged the day of surgery will not be allowed to drive home.  Name and phone number of your driver:   Special Instructions:Shower using CHG 2 nights before surgery and the night before surgery.  If you shower the day of surgery use CHG.  Use special wash - you have one bottle of CHG for all showers.  You should use approximately 1/3 of the bottle for each shower.    Please read over the following fact sheets that you were given: Pain Booklet, Coughing and Deep Breathing, MRSA Information and Surgical Site Infection Prevention

## 2013-02-08 ENCOUNTER — Encounter (HOSPITAL_COMMUNITY): Payer: Self-pay

## 2013-02-08 NOTE — Progress Notes (Signed)
Anesthesia chart review: Patient is a 67 year old female scheduled for left lumbar 2-3, lumbar 3-4, lumbar 4-5 laminectomy on 02/12/13 by Dr. Jordan Likes.  History includes renal cell carcinoma s/p right nephrectomy '10, smoking, hypothyroidism, GERD, hypertension, TIA (?), diabetes mellitus type 2, RA, gout, right TKA '04 with revision '09, lumbar laminectomy in '06 and '08.  Rheumatologist is Dr. Dareen Piano.  PCP is Dr. Kelle Darting, last visit 12/18/12.  EKG on 12/18/12 showed NSR.  CXR on 03/01/12 showed: 1. Borderline cardiomegaly without failure.  2. Linear atelectasis or scarring in the left mid lung is slightly more prominent.  3. Stable density along the peripheral right upper lobe may represent bridging between the third and fourth ribs.  4. No acute abnormality or evidence for metastatic disease.  Preoperative labs noted. A1C on 12/11/12 was 6.0.  She will be evaluated by her assigned anesthesiologist on the day of surgery, but if no acute changes then I would anticipate that she could proceed as planned.  She is on daily prednisone.  Velna Ochs [facility]/Anesthesiology Phone [phone] 02/08/2013 1:45 PM

## 2013-02-09 ENCOUNTER — Other Ambulatory Visit: Payer: Self-pay | Admitting: Family Medicine

## 2013-02-09 ENCOUNTER — Telehealth: Payer: Self-pay | Admitting: Family Medicine

## 2013-02-09 MED ORDER — GLUCOSE BLOOD VI STRP: ORAL_STRIP | Status: DC

## 2013-02-09 NOTE — Telephone Encounter (Signed)
[employer] will not cover patient's Accu-Chek Aviva test strips. They cover the One Touch.  Pt uses [facility] on [address]. Thank you.

## 2013-02-09 NOTE — Telephone Encounter (Signed)
Left message on machine for patient to return our call 

## 2013-02-11 MED ORDER — DEXAMETHASONE SODIUM PHOSPHATE 10 MG/ML IJ SOLN
10.0000 mg | INTRAMUSCULAR | Status: AC
  Administered 2013-02-12: 10 mg via INTRAVENOUS
  Filled 2013-02-11: qty 1

## 2013-02-11 MED ORDER — CEFAZOLIN SODIUM-DEXTROSE 2-3 GM-% IV SOLR
2.0000 g | INTRAVENOUS | Status: AC
  Administered 2013-02-12: 2 g via INTRAVENOUS
  Filled 2013-02-11: qty 50

## 2013-02-12 ENCOUNTER — Encounter (HOSPITAL_COMMUNITY): Payer: Self-pay | Admitting: Neurosurgery

## 2013-02-12 ENCOUNTER — Observation Stay (HOSPITAL_COMMUNITY)
Admission: RE | Admit: 2013-02-12 | Discharge: 2013-02-13 | Disposition: A | Discharge status: Home / Self Care | Payer: Medicare Other | Source: Ambulatory Visit | Attending: Neurosurgery | Admitting: Neurosurgery

## 2013-02-12 ENCOUNTER — Encounter (HOSPITAL_COMMUNITY): Payer: Self-pay | Admitting: Vascular Surgery

## 2013-02-12 ENCOUNTER — Inpatient Hospital Stay (HOSPITAL_COMMUNITY): Payer: Medicare Other | Admitting: Anesthesiology

## 2013-02-12 ENCOUNTER — Inpatient Hospital Stay (HOSPITAL_COMMUNITY): Payer: Medicare Other

## 2013-02-12 ENCOUNTER — Encounter (HOSPITAL_COMMUNITY)
Admission: RE | Disposition: A | Discharge status: Home / Self Care | Payer: Self-pay | Source: Ambulatory Visit | Attending: Neurosurgery

## 2013-02-12 DIAGNOSIS — F17200 Nicotine dependence, unspecified, uncomplicated: Secondary | ICD-10-CM | POA: Insufficient documentation

## 2013-02-12 DIAGNOSIS — K219 Gastro-esophageal reflux disease without esophagitis: Secondary | ICD-10-CM | POA: Insufficient documentation

## 2013-02-12 DIAGNOSIS — E119 Type 2 diabetes mellitus without complications: Secondary | ICD-10-CM | POA: Insufficient documentation

## 2013-02-12 DIAGNOSIS — J449 Chronic obstructive pulmonary disease, unspecified: Secondary | ICD-10-CM | POA: Insufficient documentation

## 2013-02-12 DIAGNOSIS — J4489 Other specified chronic obstructive pulmonary disease: Secondary | ICD-10-CM | POA: Insufficient documentation

## 2013-02-12 DIAGNOSIS — I10 Essential (primary) hypertension: Secondary | ICD-10-CM | POA: Insufficient documentation

## 2013-02-12 DIAGNOSIS — M48062 Spinal stenosis, lumbar region with neurogenic claudication: Principal | ICD-10-CM | POA: Diagnosis present

## 2013-02-12 DIAGNOSIS — R0602 Shortness of breath: Secondary | ICD-10-CM | POA: Insufficient documentation

## 2013-02-12 HISTORY — PX: LUMBAR LAMINECTOMY/DECOMPRESSION MICRODISCECTOMY: SHX5026

## 2013-02-12 LAB — GLUCOSE, CAPILLARY
Glucose-Capillary: 87 mg/dL (ref 70–99)
Glucose-Capillary: 107 mg/dL — ABNORMAL HIGH (ref 70–99)
Glucose-Capillary: 104 mg/dL — ABNORMAL HIGH (ref 70–99)
Glucose-Capillary: 125 mg/dL — ABNORMAL HIGH (ref 70–99)
Glucose-Capillary: 197 mg/dL — ABNORMAL HIGH (ref 70–99)

## 2013-02-12 SURGERY — LUMBAR LAMINECTOMY/DECOMPRESSION MICRODISCECTOMY 3 LEVELS
Anesthesia: General | Site: Back | Laterality: Left | Wound class: Clean

## 2013-02-12 MED ORDER — THROMBIN 20000 UNITS EX SOLR
CUTANEOUS | Status: DC | PRN
  Administered 2013-02-12: 09:00:00 via TOPICAL

## 2013-02-12 MED ORDER — MIDAZOLAM HCL 2 MG/2ML IJ SOLN: 1.0000 mg | INTRAMUSCULAR | Status: DC | PRN

## 2013-02-12 MED ORDER — TRAMADOL HCL 50 MG PO TABS
50.0000 mg | ORAL_TABLET | Freq: Three times a day (TID) | ORAL | Status: DC | PRN
  Filled 2013-02-12: qty 1

## 2013-02-12 MED ORDER — SODIUM CHLORIDE 0.9 % IJ SOLN
3.0000 mL | Freq: Two times a day (BID) | INTRAMUSCULAR | Status: DC
  Administered 2013-02-12 – 2013-02-13 (×2): 3 mL via INTRAVENOUS

## 2013-02-12 MED ORDER — HYDROMORPHONE HCL PF 1 MG/ML IJ SOLN
INTRAMUSCULAR | Status: AC
  Filled 2013-02-12: qty 1

## 2013-02-12 MED ORDER — LACTATED RINGERS IV SOLN
INTRAVENOUS | Status: DC | PRN
  Administered 2013-02-12: 07:00:00 via INTRAVENOUS

## 2013-02-12 MED ORDER — PROPOFOL 10 MG/ML IV BOLUS
INTRAVENOUS | Status: DC | PRN
  Administered 2013-02-12: 180 mg via INTRAVENOUS

## 2013-02-12 MED ORDER — PREDNISONE 5 MG PO TABS
5.0000 mg | ORAL_TABLET | Freq: Every day | ORAL | Status: DC
  Administered 2013-02-12 – 2013-02-13 (×2): 5 mg via ORAL
  Filled 2013-02-12 (×3): qty 1

## 2013-02-12 MED ORDER — EPHEDRINE SULFATE 50 MG/ML IJ SOLN
INTRAMUSCULAR | Status: DC | PRN
  Administered 2013-02-12 (×2): 10 mg via INTRAVENOUS

## 2013-02-12 MED ORDER — SODIUM CHLORIDE 0.9 % IR SOLN
Status: DC | PRN
  Administered 2013-02-12: 09:00:00

## 2013-02-12 MED ORDER — HYDROMORPHONE HCL PF 1 MG/ML IJ SOLN
0.2500 mg | INTRAMUSCULAR | Status: DC | PRN
  Administered 2013-02-12 (×2): 0.5 mg via INTRAVENOUS

## 2013-02-12 MED ORDER — FENTANYL CITRATE 0.05 MG/ML IJ SOLN: 50.0000 ug | Freq: Once | INTRAMUSCULAR | Status: DC

## 2013-02-12 MED ORDER — IPRATROPIUM BROMIDE HFA 17 MCG/ACT IN AERS
INHALATION_SPRAY | RESPIRATORY_TRACT | Status: DC | PRN
  Administered 2013-02-12: 4 via RESPIRATORY_TRACT

## 2013-02-12 MED ORDER — CYCLOBENZAPRINE HCL 10 MG PO TABS
10.0000 mg | ORAL_TABLET | Freq: Three times a day (TID) | ORAL | Status: DC | PRN
  Administered 2013-02-12 (×2): 10 mg via ORAL
  Filled 2013-02-12: qty 1

## 2013-02-12 MED ORDER — SENNA 8.6 MG PO TABS
1.0000 | ORAL_TABLET | Freq: Two times a day (BID) | ORAL | Status: DC
  Administered 2013-02-12 – 2013-02-13 (×3): 8.6 mg via ORAL
  Filled 2013-02-12 (×5): qty 1

## 2013-02-12 MED ORDER — SODIUM CHLORIDE 0.9 % IJ SOLN: 3.0000 mL | INTRAMUSCULAR | Status: DC | PRN

## 2013-02-12 MED ORDER — KETOROLAC TROMETHAMINE 30 MG/ML IJ SOLN
INTRAMUSCULAR | Status: DC | PRN
  Administered 2013-02-12: 30 mg via INTRAVENOUS

## 2013-02-12 MED ORDER — GLYCOPYRROLATE 0.2 MG/ML IJ SOLN
INTRAMUSCULAR | Status: DC | PRN
  Administered 2013-02-12: 0.6 mg via INTRAVENOUS

## 2013-02-12 MED ORDER — PANTOPRAZOLE SODIUM 40 MG PO TBEC
40.0000 mg | DELAYED_RELEASE_TABLET | Freq: Every day | ORAL | Status: DC
  Administered 2013-02-13: 40 mg via ORAL
  Filled 2013-02-12: qty 1

## 2013-02-12 MED ORDER — KETOROLAC TROMETHAMINE 30 MG/ML IJ SOLN
30.0000 mg | Freq: Four times a day (QID) | INTRAMUSCULAR | Status: DC
  Administered 2013-02-12 – 2013-02-13 (×3): 30 mg via INTRAVENOUS
  Filled 2013-02-12 (×7): qty 1

## 2013-02-12 MED ORDER — HYDROCODONE-ACETAMINOPHEN 5-325 MG PO TABS: 1.0000 | ORAL_TABLET | ORAL | Status: DC | PRN

## 2013-02-12 MED ORDER — BACITRACIN 50000 UNITS IM SOLR
INTRAMUSCULAR | Status: AC
  Filled 2013-02-12: qty 1

## 2013-02-12 MED ORDER — CYCLOBENZAPRINE HCL 10 MG PO TABS
ORAL_TABLET | ORAL | Status: AC
  Filled 2013-02-12: qty 1

## 2013-02-12 MED ORDER — PROMETHAZINE HCL 25 MG/ML IJ SOLN: 6.2500 mg | INTRAMUSCULAR | Status: DC | PRN

## 2013-02-12 MED ORDER — METFORMIN HCL 500 MG PO TABS
500.0000 mg | ORAL_TABLET | Freq: Every day | ORAL | Status: DC
  Administered 2013-02-13: 500 mg via ORAL
  Filled 2013-02-12 (×2): qty 1

## 2013-02-12 MED ORDER — FLUTICASONE PROPIONATE 50 MCG/ACT NA SUSP
2.0000 | Freq: Every day | NASAL | Status: DC
  Administered 2013-02-13: 2 via NASAL
  Filled 2013-02-12: qty 16

## 2013-02-12 MED ORDER — ONDANSETRON HCL 4 MG/2ML IJ SOLN
INTRAMUSCULAR | Status: DC | PRN
  Administered 2013-02-12: 4 mg via INTRAVENOUS

## 2013-02-12 MED ORDER — CEFAZOLIN SODIUM 1-5 GM-% IV SOLN
1.0000 g | Freq: Three times a day (TID) | INTRAVENOUS | Status: AC
  Administered 2013-02-12 (×2): 1 g via INTRAVENOUS
  Filled 2013-02-12 (×2): qty 50

## 2013-02-12 MED ORDER — SODIUM CHLORIDE 0.9 % IV SOLN
10.0000 mg | INTRAVENOUS | Status: DC | PRN
  Administered 2013-02-12: 10 ug/min via INTRAVENOUS

## 2013-02-12 MED ORDER — ZOLPIDEM TARTRATE 5 MG PO TABS: 5.0000 mg | ORAL_TABLET | Freq: Every evening | ORAL | Status: DC | PRN

## 2013-02-12 MED ORDER — MENTHOL 3 MG MT LOZG: 1.0000 | LOZENGE | OROMUCOSAL | Status: DC | PRN

## 2013-02-12 MED ORDER — HYDROMORPHONE HCL PF 1 MG/ML IJ SOLN: 0.5000 mg | INTRAMUSCULAR | Status: DC | PRN

## 2013-02-12 MED ORDER — LEVOTHYROXINE SODIUM 50 MCG PO TABS: 50.0000 ug | ORAL_TABLET | Freq: Every day | ORAL | Status: DC

## 2013-02-12 MED ORDER — ROCURONIUM BROMIDE 100 MG/10ML IV SOLN
INTRAVENOUS | Status: DC | PRN
  Administered 2013-02-12: 50 mg via INTRAVENOUS
  Administered 2013-02-12: 10 mg via INTRAVENOUS
  Administered 2013-02-12: 30 mg via INTRAVENOUS

## 2013-02-12 MED ORDER — ALUM & MAG HYDROXIDE-SIMETH 200-200-20 MG/5ML PO SUSP: 30.0000 mL | Freq: Four times a day (QID) | ORAL | Status: DC | PRN

## 2013-02-12 MED ORDER — FENTANYL CITRATE 0.05 MG/ML IJ SOLN
INTRAMUSCULAR | Status: DC | PRN
  Administered 2013-02-12 (×3): 50 ug via INTRAVENOUS
  Administered 2013-02-12: 100 ug via INTRAVENOUS

## 2013-02-12 MED ORDER — 0.9 % SODIUM CHLORIDE (POUR BTL) OPTIME
TOPICAL | Status: DC | PRN
  Administered 2013-02-12: 1000 mL

## 2013-02-12 MED ORDER — PHENOL 1.4 % MT LIQD: 1.0000 | OROMUCOSAL | Status: DC | PRN

## 2013-02-12 MED ORDER — ATORVASTATIN CALCIUM 20 MG PO TABS
20.0000 mg | ORAL_TABLET | Freq: Every day | ORAL | Status: DC
  Administered 2013-02-12: 20 mg via ORAL
  Filled 2013-02-12 (×2): qty 1

## 2013-02-12 MED ORDER — LEVOTHYROXINE SODIUM 50 MCG PO TABS
50.0000 ug | ORAL_TABLET | Freq: Every day | ORAL | Status: DC
  Administered 2013-02-13: 50 ug via ORAL
  Filled 2013-02-12 (×2): qty 1

## 2013-02-12 MED ORDER — OXYCODONE-ACETAMINOPHEN 5-325 MG PO TABS
1.0000 | ORAL_TABLET | ORAL | Status: DC | PRN
  Administered 2013-02-12 – 2013-02-13 (×3): 1 via ORAL
  Filled 2013-02-12 (×3): qty 1

## 2013-02-12 MED ORDER — ACETAMINOPHEN 325 MG PO TABS: 650.0000 mg | ORAL_TABLET | ORAL | Status: DC | PRN

## 2013-02-12 MED ORDER — LIDOCAINE HCL (CARDIAC) 20 MG/ML IV SOLN
INTRAVENOUS | Status: DC | PRN
  Administered 2013-02-12: 100 mg via INTRAVENOUS

## 2013-02-12 MED ORDER — ONDANSETRON HCL 4 MG/2ML IJ SOLN: 4.0000 mg | INTRAMUSCULAR | Status: DC | PRN

## 2013-02-12 MED ORDER — ATENOLOL 50 MG PO TABS
50.0000 mg | ORAL_TABLET | Freq: Every day | ORAL | Status: DC
  Administered 2013-02-13: 50 mg via ORAL
  Filled 2013-02-12: qty 1

## 2013-02-12 MED ORDER — PHENYLEPHRINE HCL 10 MG/ML IJ SOLN
INTRAMUSCULAR | Status: DC | PRN
  Administered 2013-02-12: 80 ug via INTRAVENOUS
  Administered 2013-02-12: 40 ug via INTRAVENOUS
  Administered 2013-02-12: 120 ug via INTRAVENOUS
  Administered 2013-02-12 (×2): 80 ug via INTRAVENOUS

## 2013-02-12 MED ORDER — SODIUM CHLORIDE 0.9 % IV SOLN
INTRAVENOUS | Status: AC
  Filled 2013-02-12: qty 500

## 2013-02-12 MED ORDER — MIDAZOLAM HCL 5 MG/5ML IJ SOLN
INTRAMUSCULAR | Status: DC | PRN
  Administered 2013-02-12: 2 mg via INTRAVENOUS

## 2013-02-12 MED ORDER — SODIUM CHLORIDE 0.9 % IV SOLN: 250.0000 mL | INTRAVENOUS | Status: DC

## 2013-02-12 MED ORDER — LEFLUNOMIDE 10 MG PO TABS
10.0000 mg | ORAL_TABLET | Freq: Every day | ORAL | Status: DC
  Administered 2013-02-12 – 2013-02-13 (×2): 10 mg via ORAL
  Filled 2013-02-12 (×2): qty 1

## 2013-02-12 MED ORDER — ACETAMINOPHEN 650 MG RE SUPP: 650.0000 mg | RECTAL | Status: DC | PRN

## 2013-02-12 MED ORDER — BUPIVACAINE HCL (PF) 0.25 % IJ SOLN
INTRAMUSCULAR | Status: DC | PRN
  Administered 2013-02-12: 10 mL

## 2013-02-12 MED ORDER — NEOSTIGMINE METHYLSULFATE 1 MG/ML IJ SOLN
INTRAMUSCULAR | Status: DC | PRN
  Administered 2013-02-12: 5 mg via INTRAVENOUS

## 2013-02-12 SURGICAL SUPPLY — 54 items
BAG DECANTER FOR FLEXI CONT (MISCELLANEOUS) ×2 IMPLANT
BENZOIN TINCTURE PRP APPL 2/3 (GAUZE/BANDAGES/DRESSINGS) ×2 IMPLANT
BLADE SURG ROTATE 9660 (MISCELLANEOUS) IMPLANT
BRUSH SCRUB EZ PLAIN DRY (MISCELLANEOUS) ×2 IMPLANT
BUR CUTTER 7.0 ROUND (BURR) ×2 IMPLANT
CANISTER SUCTION 2500CC (MISCELLANEOUS) ×2 IMPLANT
CLOTH BEACON ORANGE TIMEOUT ST (SAFETY) ×2 IMPLANT
CONT SPEC 4OZ CLIKSEAL STRL BL (MISCELLANEOUS) ×2 IMPLANT
DECANTER SPIKE VIAL GLASS SM (MISCELLANEOUS) ×2 IMPLANT
DERMABOND ADHESIVE PROPEN (GAUZE/BANDAGES/DRESSINGS) ×1
DERMABOND ADVANCED (GAUZE/BANDAGES/DRESSINGS) ×1
DERMABOND ADVANCED .7 DNX12 (GAUZE/BANDAGES/DRESSINGS) ×1 IMPLANT
DERMABOND ADVANCED .7 DNX6 (GAUZE/BANDAGES/DRESSINGS) ×1 IMPLANT
DRAPE LAPAROTOMY 100X72X124 (DRAPES) ×2 IMPLANT
DRAPE MICROSCOPE LEICA (MISCELLANEOUS) ×2 IMPLANT
DRAPE POUCH INSTRU U-SHP 10X18 (DRAPES) ×2 IMPLANT
DRAPE PROXIMA HALF (DRAPES) IMPLANT
DRAPE SURG 17X23 STRL (DRAPES) ×4 IMPLANT
DURAPREP 26ML APPLICATOR (WOUND CARE) ×2 IMPLANT
ELECT REM PT RETURN 9FT ADLT (ELECTROSURGICAL) ×2
ELECTRODE REM PT RTRN 9FT ADLT (ELECTROSURGICAL) ×1 IMPLANT
EVACUATOR 1/8 PVC DRAIN (DRAIN) ×2 IMPLANT
GAUZE SPONGE 4X4 16PLY XRAY LF (GAUZE/BANDAGES/DRESSINGS) IMPLANT
GLOVE BIOGEL PI IND STRL 7.0 (GLOVE) ×2 IMPLANT
GLOVE BIOGEL PI IND STRL 8 (GLOVE) ×1 IMPLANT
GLOVE BIOGEL PI INDICATOR 7.0 (GLOVE) ×2
GLOVE BIOGEL PI INDICATOR 8 (GLOVE) ×1
GLOVE ECLIPSE 8.5 STRL (GLOVE) ×2 IMPLANT
GLOVE EXAM NITRILE LRG STRL (GLOVE) ×2 IMPLANT
GLOVE EXAM NITRILE MD LF STRL (GLOVE) IMPLANT
GLOVE EXAM NITRILE XL STR (GLOVE) IMPLANT
GLOVE EXAM NITRILE XS STR PU (GLOVE) IMPLANT
GLOVE SURG SS PI 6.5 STRL IVOR (GLOVE) ×6 IMPLANT
GOWN BRE IMP SLV AUR LG STRL (GOWN DISPOSABLE) IMPLANT
GOWN BRE IMP SLV AUR XL STRL (GOWN DISPOSABLE) ×4 IMPLANT
GOWN STRL REIN 2XL LVL4 (GOWN DISPOSABLE) ×2 IMPLANT
KIT BASIN OR (CUSTOM PROCEDURE TRAY) ×2 IMPLANT
KIT ROOM TURNOVER OR (KITS) ×2 IMPLANT
NEEDLE HYPO 22GX1.5 SAFETY (NEEDLE) ×2 IMPLANT
NEEDLE SPNL 22GX3.5 QUINCKE BK (NEEDLE) ×2 IMPLANT
NS IRRIG 1000ML POUR BTL (IV SOLUTION) ×2 IMPLANT
PACK LAMINECTOMY NEURO (CUSTOM PROCEDURE TRAY) ×2 IMPLANT
PAD ARMBOARD 7.5X6 YLW CONV (MISCELLANEOUS) ×6 IMPLANT
RUBBERBAND STERILE (MISCELLANEOUS) ×4 IMPLANT
SPONGE GAUZE 4X4 12PLY (GAUZE/BANDAGES/DRESSINGS) ×2 IMPLANT
SPONGE SURGIFOAM ABS GEL SZ50 (HEMOSTASIS) ×2 IMPLANT
STRIP CLOSURE SKIN 1/2X4 (GAUZE/BANDAGES/DRESSINGS) ×4 IMPLANT
SUT VIC AB 2-0 CT1 18 (SUTURE) ×2 IMPLANT
SUT VIC AB 3-0 SH 8-18 (SUTURE) ×2 IMPLANT
SYR 20ML ECCENTRIC (SYRINGE) ×2 IMPLANT
TAPE CLOTH SURG 4X10 WHT LF (GAUZE/BANDAGES/DRESSINGS) ×2 IMPLANT
TOWEL OR 17X24 6PK STRL BLUE (TOWEL DISPOSABLE) ×2 IMPLANT
TOWEL OR 17X26 10 PK STRL BLUE (TOWEL DISPOSABLE) ×2 IMPLANT
WATER STERILE IRR 1000ML POUR (IV SOLUTION) ×2 IMPLANT

## 2013-02-12 NOTE — H&P (Signed)
[name] is an 67 y.o. female.   Chief Complaint: Back and bilateral leg pain HPI: 67 year old female is progressive back and bilateral lower extremity symptoms consistent with severe neurogenic claudication. Workup demonstrates evidence of marked multilevel spondylosis and stenosis. Patient's failed conservative management and presents now for multilevel lumbar decompressive laminectomy with foraminotomies in hopes of improving her symptoms.  Past Medical History  Diagnosis Date  . Allergy   . GERD (gastroesophageal reflux disease)   . Hypertension   . Thyroid disease   . TIA (transient ischemic attack)   . Costochondritis   . S/P TKR (total knee replacement) 09  . S/P TAH (total abdominal hysterectomy)   . Total knee replacement status 09/2007    redo  . Diabetes mellitus without complication     type 2  . Shortness of breath     Hx: of with exertion  . Pneumonia     Hx: of  . Tingling     Hx: of in feet  . Arthritis     RA (Dr. Dareen Piano)  . Osteoarthritis     Past Surgical History  Procedure Laterality Date  . Abdominal hysterectomy  1978  . Right tkr    . Hnp    . Nephrectomy  2010    10.rcc right cancer  . Back surgery      Hx: of   . Colonoscopy w/ biopsies and polypectomy      Hx: of    Family History  Problem Relation Age of Onset  . Arthritis Mother     rheumatoid  . Diabetes      family hx  . Arthritis    . Prostate cancer Father   . Heart disease Father    Social History:  reports that she has been smoking Cigarettes.  She has a 2.5 pack-year smoking history. She has never used smokeless tobacco. She reports that she does not drink alcohol or use illicit drugs.  Allergies:  Allergies  Allergen Reactions  . Aspirin     REACTION: GI upset  . Codeine     REACTION: hyper    Medications Prior to Admission  Medication Sig Dispense Refill  . atenolol (TENORMIN) 50 MG tablet Take 50 mg by mouth daily.      [name] Kitchen atorvastatin (LIPITOR) 20 MG tablet  Take 20 mg by mouth daily.      . fluticasone (FLONASE) 50 MCG/ACT nasal spray Place 2 sprays into the nose daily.      . fluticasone (FLONASE) 50 MCG/ACT nasal spray PLACE 2 SPRAYS INTO THE NOSE EVERY DAY  16 g  3  . Lancets MISC by Does not apply route.        . leflunomide (ARAVA) 10 MG tablet Take 10 mg by mouth daily.      [name] Kitchen levothyroxine (SYNTHROID, LEVOTHROID) 50 MCG tablet Take 50 mcg by mouth daily.      . metFORMIN (GLUCOPHAGE) 500 MG tablet Take 500 mg by mouth daily with breakfast.      . Na Sulfate-K Sulfate-Mg Sulf (SUPREP BOWEL PREP) SOLN SUPREP take as directed no substitution      . pantoprazole (PROTONIX) 40 MG tablet Take 40 mg by mouth daily.      . predniSONE (DELTASONE) 5 MG tablet Take 5 mg by mouth daily.      . traMADol (ULTRAM) 50 MG tablet Take 50 mg by mouth every 8 (eight) hours as needed for pain.      [name] Kitchen glucose blood test  strip Once daily for glucose control dx 250.00  100 each  12  . levothyroxine (SYNTHROID, LEVOTHROID) 50 MCG tablet TAKE 1 TABLET BY MOUTH EVERY DAY  100 tablet  3    Results for orders placed during the hospital encounter of 02/12/13 (from the past 48 hour(s))  GLUCOSE, CAPILLARY     Status: None   Collection Time    02/12/13  6:26 AM      Result Value Range   Glucose-Capillary 87  70 - 99 mg/dL   No results found.  Review of Systems  Constitutional: Negative.   HENT: Negative.   Eyes: Negative.   Respiratory: Negative.   Cardiovascular: Negative.   Gastrointestinal: Negative.   Genitourinary: Negative.   Musculoskeletal: Negative.   Skin: Negative.   Neurological: Negative.   Endo/Heme/Allergies: Negative.   Psychiatric/Behavioral: Negative.     Blood pressure 198/98, pulse 66, temperature 98.2 F (36.8 C), temperature source Oral, resp. rate 18, SpO2 99.00%. Physical Exam  Constitutional: She is oriented to person, place, and time. She appears well-developed and well-nourished. No distress.  HENT:  Head: Normocephalic and  atraumatic.  Right Ear: External ear normal.  Left Ear: External ear normal.  Nose: Nose normal.  Mouth/Throat: Oropharynx is clear and moist.  Eyes: Conjunctivae and EOM are normal. Pupils are equal, round, and reactive to light. Right eye exhibits no discharge. Left eye exhibits no discharge.  Neck: Normal range of motion. Neck supple. No tracheal deviation present. No thyromegaly present.  Cardiovascular: Normal rate, regular rhythm and intact distal pulses.  Exam reveals no friction rub.   No murmur heard. Respiratory: Effort normal and breath sounds normal. No respiratory distress. She has no wheezes.  GI: Soft. Bowel sounds are normal. She exhibits no distension. There is no tenderness.  Musculoskeletal: Normal range of motion. She exhibits no edema and no tenderness.  Neurological: She is alert and oriented to person, place, and time. She has normal reflexes. No cranial nerve deficit. Coordination normal.  Skin: Skin is warm and dry. No rash noted. She is not diaphoretic. No erythema. No pallor.  Psychiatric: She has a normal mood and affect. Her behavior is normal. Judgment and thought content normal.     Assessment/Plan L2-3, L3-4, L4-5 stenosis with neurogenic claudication. Plan L2-3, L3-4, L4-5 decompressive laminectomy with foraminotomies. Risks and benefits been explained. Patient wishes to proceed.  Gari Hartsell A 02/12/2013, 7:43 AM

## 2013-02-12 NOTE — Preoperative (Signed)
Beta Blockers   Reason not to administer Beta Blockers:Not Applicable 

## 2013-02-12 NOTE — Anesthesia Postprocedure Evaluation (Signed)
  Anesthesia Post-op Note  Patient: [name]  Procedure(s) Performed: Procedure(s): LUMBAR TWO THREE, LUMBAR THREE FOUR, LUMBAR FOUR FIVE  LAMINECTOMY/DECOMPRESSION MICRODISCECTOMY 3 LEVELS (Left)  Patient Location: PACU  Anesthesia Type:General  Level of Consciousness: awake  Airway and Oxygen Therapy: Patient Spontanous Breathing  Post-op Pain: mild  Post-op Assessment: Post-op Vital signs reviewed, Patient's Cardiovascular Status Stable, Respiratory Function Stable, Patent Airway, No signs of Nausea or vomiting and Pain level controlled  Post-op Vital Signs: stable  Complications: No apparent anesthesia complications

## 2013-02-12 NOTE — Brief Op Note (Signed)
02/12/2013  10:05 AM  PATIENT:  [name]  67 y.o. female  PRE-OPERATIVE DIAGNOSIS:  stenosis  POST-OPERATIVE DIAGNOSIS:  stenosis  PROCEDURE:  Procedure(s): LUMBAR TWO THREE, LUMBAR THREE FOUR, LUMBAR FOUR FIVE  LAMINECTOMY/DECOMPRESSION MICRODISCECTOMY 3 LEVELS (Left)  SURGEON:  Surgeon(s) and Role:    * Temple Pacini, MD - Primary    * Barnett Abu, MD - Assisting  PHYSICIAN ASSISTANT:   ASSISTANTS:    ANESTHESIA:   general  EBL:  Total I/O In: 1500 [I.V.:1500] Out: 250 [Blood:250]  BLOOD ADMINISTERED:none  DRAINS: (Medium) Hemovact drain(s) in the And epidural space with  Suction Open   LOCAL MEDICATIONS USED:  MARCAINE     SPECIMEN:  No Specimen  DISPOSITION OF SPECIMEN:  N/A  COUNTS:  YES  TOURNIQUET:  * No tourniquets in log *  DICTATION: .Dragon Dictation  PLAN OF CARE: Admit for overnight observation  PATIENT DISPOSITION:  PACU - hemodynamically stable.   Delay start of Pharmacological VTE agent (>24hrs) due to surgical blood loss or risk of bleeding: yes

## 2013-02-12 NOTE — Addendum Note (Signed)
Addendum created 02/12/13 1142 by Quentin Ore, CRNA   Modules edited: Anesthesia Medication Administration

## 2013-02-12 NOTE — Anesthesia Procedure Notes (Signed)
Procedure Name: Intubation Date/Time: 02/12/2013 7:57 AM Performed by: Quentin Ore Pre-anesthesia Checklist: Patient identified, Emergency Drugs available, Suction available, Patient being monitored and Timeout performed Patient Re-evaluated:Patient Re-evaluated prior to inductionOxygen Delivery Method: Circle system utilized Preoxygenation: Pre-oxygenation with 100% oxygen Intubation Type: IV induction Ventilation: Mask ventilation without difficulty Laryngoscope Size: Mac and 3 Grade View: Grade III Tube type: Oral Tube size: 7.5 mm Number of attempts: 1 Airway Equipment and Method: Stylet Placement Confirmation: ETT inserted through vocal cords under direct vision and positive ETCO2 Secured at: 22 cm Tube secured with: Tape Dental Injury: Teeth and Oropharynx as per pre-operative assessment

## 2013-02-12 NOTE — Transfer of Care (Signed)
Immediate Anesthesia Transfer of Care Note  Patient: [name]  Procedure(s) Performed: Procedure(s): LUMBAR TWO THREE, LUMBAR THREE FOUR, LUMBAR FOUR FIVE  LAMINECTOMY/DECOMPRESSION MICRODISCECTOMY 3 LEVELS (Left)  Patient Location: PACU  Anesthesia Type:General  Level of Consciousness: awake, alert  and oriented  Airway & Oxygen Therapy: Patient Spontanous Breathing and Patient connected to face mask oxygen  Post-op Assessment: Report given to PACU RN, Post -op Vital signs reviewed and stable and Patient moving all extremities X 4  Post vital signs: Reviewed and stable  Complications: No apparent anesthesia complications

## 2013-02-12 NOTE — Op Note (Signed)
Date of procedure: 02/12/2013  Date of dictation: Same  Service: Neurosurgery  Preoperative diagnosis: Lumbar stenosis with neurogenic claudication affecting the exiting L2, L3, L4, L5 nerve roots bilaterally.  Postoperative diagnosis: Same  Procedure Name: L2-3-4 5 decompressive laminectomy with bilateral L2 L3-L4 and L5 decompressive foraminotomies  Surgeon:Kadyn Chovan A.Boden Stucky, M.D.  Asst. Surgeon: Danielle Dess  Anesthesia: General  Indication: 67 year old female with progressive back and bilateral r symptoms consistent with neurogenic claudication. Workup demonstrates evidence of marked multilevel spondylosis and stenosis. Patient's failed conservative management and presents now for multilevel decompressive laminectomy and foraminotomies in hopes of improving her symptoms.  Operative note: After induction anesthesia, patient positioned prone onto Wilson frame and appropriate padded. Patient's lumbar regions prepped draped sterilely. 10 Lasix linear skin incision extending from L2-L5. Subperiosteal dissection performed bilaterally retractor placed. X-ray taken. Level confirmed. Decompressive laminectomies were then performed using Leksell rongeurs Kerrison rongeurs and a high-speed drill to remove almost the entire lamina of L2 the entire lamina of L3 the entire lamina of L4 and the superior aspect of lamina of L5. Ligamentum flavum was elevated and resected piecemeal fashion. Underlying thecal sac then applied. Decompressive foraminotomies in the form of course exiting L2, L3, L4, L5 nerve roots bilaterally by undercutting the facet joint and got hours. At this point a very thorough decompression achieved. There is no evidence of injury to thecal sac and nerve roots. Wounds that area without solution. Gelfoam with postoperative hemostasis. Medium Hemovac drain was left at per space. Wounds and close in layers with Vicryl sutures. Steri-Strips and sterile dressing were applied. There were no apparent  complications. Patient tolerated the procedure well and she returns to the recovery room postop.

## 2013-02-12 NOTE — Anesthesia Preprocedure Evaluation (Addendum)
Anesthesia Evaluation  Patient identified by MRN, date of birth, ID band Patient awake    Reviewed: Allergy & Precautions, H&P , NPO status , Patient's Chart, lab work & pertinent test results  Airway Mallampati: II TM Distance: >3 FB Neck ROM: Full    Dental  (+) Teeth Intact   Pulmonary shortness of breath, COPDCurrent Smoker,  + rhonchi         Cardiovascular hypertension, Rhythm:Regular Rate:Normal     Neuro/Psych TIA   GI/Hepatic GERD-  ,  Endo/Other  diabetes  Renal/GU Renal cell ca     Musculoskeletal  (+) Arthritis -,   Abdominal (+) + obese,   Peds  Hematology   Anesthesia Other Findings   Reproductive/Obstetrics                         Anesthesia Physical Anesthesia Plan  ASA: IV  Anesthesia Plan: General   Post-op Pain Management:    Induction: Intravenous  Airway Management Planned: Oral ETT  Additional Equipment:   Intra-op Plan:   Post-operative Plan: Extubation in OR  Informed Consent: I have reviewed the patients History and Physical, chart, labs and discussed the procedure including the risks, benefits and alternatives for the proposed anesthesia with the patient or authorized representative who has indicated his/her understanding and acceptance.     Plan Discussed with: CRNA and Surgeon  Anesthesia Plan Comments:         Anesthesia Quick Evaluation

## 2013-02-13 LAB — GLUCOSE, CAPILLARY: Glucose-Capillary: 96 mg/dL (ref 70–99)

## 2013-02-13 MED ORDER — OXYCODONE-ACETAMINOPHEN 5-325 MG PO TABS: 1.0000 | ORAL_TABLET | ORAL | Status: DC | PRN

## 2013-02-13 MED ORDER — CYCLOBENZAPRINE HCL 10 MG PO TABS: 10.0000 mg | ORAL_TABLET | Freq: Three times a day (TID) | ORAL | Status: DC | PRN

## 2013-02-13 NOTE — Discharge Summary (Signed)
Physician Discharge Summary  Patient ID: [name] MRN: [id number] DOB/AGE: Mar 11, 1946 67 y.o.  Admit date: 02/12/2013 Discharge date: 02/13/2013  Admission Diagnoses:  Discharge Diagnoses:  Principal Problem:   Spinal stenosis, lumbar region, with neurogenic claudication   Discharged Condition: good  Hospital Course: Patient admitted to the hospital where she underwent an uncomplicated multilevel decompressive laminectomy. Postoperatively she is done well. Back and lower extremity pain improved. Ready for discharge home.  Consults:   Significant Diagnostic Studies:   Treatments:   Discharge Exam: Blood pressure 141/87, pulse 69, temperature 97.2 F (36.2 C), temperature source Oral, resp. rate 18, SpO2 96.00%. Awake and alert. Oriented and appropriate. Cranial nerve function intact. Motor and sensory function of the extremities normal. Wound clean and dry. Chest and abdomen benign.  Disposition:    Future Appointments Provider Department Dept Phone   06/25/2013 10:15 AM Roderick Pee, MD [facility] [phone]       Medication List         atenolol 50 MG tablet  Commonly known as:  TENORMIN  Take 50 mg by mouth daily.     atorvastatin 20 MG tablet  Commonly known as:  LIPITOR  Take 20 mg by mouth daily.     cyclobenzaprine 10 MG tablet  Commonly known as:  FLEXERIL  Take 1 tablet (10 mg total) by mouth 3 (three) times daily as needed for muscle spasms.     fluticasone 50 MCG/ACT nasal spray  Commonly known as:  FLONASE  Place 2 sprays into the nose daily.     fluticasone 50 MCG/ACT nasal spray  Commonly known as:  FLONASE  PLACE 2 SPRAYS INTO THE NOSE EVERY DAY     glucose blood test strip  Once daily for glucose control dx 250.00     Lancets Misc  by Does not apply route.     leflunomide 10 MG tablet  Commonly known as:  ARAVA  Take 10 mg by mouth daily.     levothyroxine 50 MCG tablet  Commonly known as:  SYNTHROID,  LEVOTHROID  Take 50 mcg by mouth daily.     levothyroxine 50 MCG tablet  Commonly known as:  SYNTHROID, LEVOTHROID  TAKE 1 TABLET BY MOUTH EVERY DAY     metFORMIN 500 MG tablet  Commonly known as:  GLUCOPHAGE  Take 500 mg by mouth daily with breakfast.     oxyCODONE-acetaminophen 5-325 MG per tablet  Commonly known as:  PERCOCET/ROXICET  Take 1-2 tablets by mouth every 4 (four) hours as needed.     pantoprazole 40 MG tablet  Commonly known as:  PROTONIX  Take 40 mg by mouth daily.     predniSONE 5 MG tablet  Commonly known as:  DELTASONE  Take 5 mg by mouth daily.     SUPREP BOWEL PREP Soln  Generic drug:  Na Sulfate-K Sulfate-Mg Sulf  SUPREP take as directed no substitution     traMADol 50 MG tablet  Commonly known as:  ULTRAM  Take 50 mg by mouth every 8 (eight) hours as needed for pain.           Follow-up Information   Follow up with Mykelle Cockerell A, MD. Call in 1 week. (ext [phone])    Contact information:   [address] [place] [address] [phone]       Signed: Julio Sicks A 02/13/2013, 11:31 AM

## 2013-02-13 NOTE — Progress Notes (Signed)
Pt given D/C instructions with Rx's, verbal understanding given. Pt D/C'd home via wheelchair @ 1315 per MD order. Rema Fendt, RN

## 2013-02-15 ENCOUNTER — Encounter (HOSPITAL_COMMUNITY): Payer: Self-pay | Admitting: Neurosurgery

## 2013-03-07 ENCOUNTER — Other Ambulatory Visit (HOSPITAL_COMMUNITY): Payer: Self-pay | Admitting: Urology

## 2013-03-07 ENCOUNTER — Ambulatory Visit (HOSPITAL_COMMUNITY)
Admission: RE | Admit: 2013-03-07 | Discharge: 2013-03-07 | Disposition: A | Discharge status: Home / Self Care | Payer: Medicare Other | Source: Ambulatory Visit | Attending: Urology | Admitting: Urology

## 2013-03-07 DIAGNOSIS — C649 Malignant neoplasm of unspecified kidney, except renal pelvis: Secondary | ICD-10-CM | POA: Insufficient documentation

## 2013-03-07 DIAGNOSIS — I10 Essential (primary) hypertension: Secondary | ICD-10-CM | POA: Insufficient documentation

## 2013-03-07 DIAGNOSIS — Z85528 Personal history of other malignant neoplasm of kidney: Secondary | ICD-10-CM

## 2013-03-07 DIAGNOSIS — E119 Type 2 diabetes mellitus without complications: Secondary | ICD-10-CM | POA: Insufficient documentation

## 2013-03-07 DIAGNOSIS — F17200 Nicotine dependence, unspecified, uncomplicated: Secondary | ICD-10-CM | POA: Insufficient documentation

## 2013-03-26 ENCOUNTER — Ambulatory Visit: Payer: Medicare Other | Attending: Neurosurgery

## 2013-03-26 DIAGNOSIS — R5381 Other malaise: Secondary | ICD-10-CM | POA: Insufficient documentation

## 2013-03-26 DIAGNOSIS — R293 Abnormal posture: Secondary | ICD-10-CM | POA: Insufficient documentation

## 2013-03-26 DIAGNOSIS — IMO0001 Reserved for inherently not codable concepts without codable children: Secondary | ICD-10-CM | POA: Insufficient documentation

## 2013-03-26 DIAGNOSIS — M545 Low back pain, unspecified: Secondary | ICD-10-CM | POA: Insufficient documentation

## 2013-03-28 ENCOUNTER — Ambulatory Visit: Payer: Medicare Other

## 2013-03-30 ENCOUNTER — Ambulatory Visit: Payer: Medicare Other

## 2013-04-04 ENCOUNTER — Encounter: Payer: Medicare Other | Admitting: Rehabilitation

## 2013-04-11 ENCOUNTER — Ambulatory Visit: Payer: Medicare Other

## 2013-04-24 ENCOUNTER — Emergency Department (HOSPITAL_COMMUNITY)
Admission: EM | Admit: 2013-04-24 | Discharge: 2013-04-24 | Disposition: A | Discharge status: Home / Self Care | Payer: Medicare Other | Attending: Emergency Medicine | Admitting: Emergency Medicine

## 2013-04-24 ENCOUNTER — Encounter (HOSPITAL_COMMUNITY): Payer: Self-pay | Admitting: Emergency Medicine

## 2013-04-24 DIAGNOSIS — F17200 Nicotine dependence, unspecified, uncomplicated: Secondary | ICD-10-CM | POA: Insufficient documentation

## 2013-04-24 DIAGNOSIS — I10 Essential (primary) hypertension: Secondary | ICD-10-CM | POA: Insufficient documentation

## 2013-04-24 DIAGNOSIS — K219 Gastro-esophageal reflux disease without esophagitis: Secondary | ICD-10-CM | POA: Insufficient documentation

## 2013-04-24 DIAGNOSIS — Z8701 Personal history of pneumonia (recurrent): Secondary | ICD-10-CM | POA: Insufficient documentation

## 2013-04-24 DIAGNOSIS — Z8739 Personal history of other diseases of the musculoskeletal system and connective tissue: Secondary | ICD-10-CM | POA: Insufficient documentation

## 2013-04-24 DIAGNOSIS — Y9389 Activity, other specified: Secondary | ICD-10-CM | POA: Insufficient documentation

## 2013-04-24 DIAGNOSIS — T23279A Burn of second degree of unspecified wrist, initial encounter: Secondary | ICD-10-CM | POA: Insufficient documentation

## 2013-04-24 DIAGNOSIS — X12XXXA Contact with other hot fluids, initial encounter: Secondary | ICD-10-CM | POA: Insufficient documentation

## 2013-04-24 DIAGNOSIS — T23202A Burn of second degree of left hand, unspecified site, initial encounter: Secondary | ICD-10-CM

## 2013-04-24 DIAGNOSIS — E119 Type 2 diabetes mellitus without complications: Secondary | ICD-10-CM | POA: Insufficient documentation

## 2013-04-24 DIAGNOSIS — Z79899 Other long term (current) drug therapy: Secondary | ICD-10-CM | POA: Insufficient documentation

## 2013-04-24 DIAGNOSIS — IMO0002 Reserved for concepts with insufficient information to code with codable children: Secondary | ICD-10-CM | POA: Insufficient documentation

## 2013-04-24 DIAGNOSIS — Y9289 Other specified places as the place of occurrence of the external cause: Secondary | ICD-10-CM | POA: Insufficient documentation

## 2013-04-24 DIAGNOSIS — E079 Disorder of thyroid, unspecified: Secondary | ICD-10-CM | POA: Insufficient documentation

## 2013-04-24 MED ORDER — CLINDAMYCIN HCL 150 MG PO CAPS: 150.0000 mg | ORAL_CAPSULE | Freq: Three times a day (TID) | ORAL | Status: DC

## 2013-04-24 MED ORDER — SILVER SULFADIAZINE 1 % EX CREA
TOPICAL_CREAM | Freq: Once | CUTANEOUS | Status: AC
  Administered 2013-04-24: 1 via TOPICAL
  Filled 2013-04-24: qty 50

## 2013-04-24 MED ORDER — SILVER SULFADIAZINE 1 % EX CREA: TOPICAL_CREAM | Freq: Two times a day (BID) | CUTANEOUS | Status: DC
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
## 2013-04-30 NOTE — ED Provider Notes (Signed)
Medical screening examination/treatment/procedure(s) were performed by non-physician practitioner and as supervising physician I was immediately available for consultation/collaboration.  Doug Sou, MD 04/30/13 [phone]

## 2013-06-20 ENCOUNTER — Ambulatory Visit: Payer: Medicare Other | Admitting: Family Medicine

## 2013-06-21 ENCOUNTER — Ambulatory Visit (INDEPENDENT_AMBULATORY_CARE_PROVIDER_SITE_OTHER): Payer: Medicare Other | Admitting: Family Medicine

## 2013-06-21 ENCOUNTER — Encounter: Payer: Self-pay | Admitting: Family Medicine

## 2013-06-21 VITALS — BP 122/88 | Temp 97.9°F | Wt 206.0 lb

## 2013-06-21 DIAGNOSIS — E119 Type 2 diabetes mellitus without complications: Secondary | ICD-10-CM

## 2013-06-21 LAB — BASIC METABOLIC PANEL
BUN: 19 mg/dL (ref 6–23)
CO2: 27 mEq/L (ref 19–32)
Calcium: 9.5 mg/dL (ref 8.4–10.5)
Chloride: 105 mEq/L (ref 96–112)
Creatinine, Ser: 1.2 mg/dL (ref 0.4–1.2)
GFR: 60.1 mL/min (ref >60.00)
Glucose, Bld: 98 mg/dL (ref 70–99)
Potassium: 3.3 mEq/L — ABNORMAL LOW (ref 3.5–5.1)
Sodium: 139 mEq/L (ref 135–145)

## 2013-06-21 LAB — TSH: TSH: 1.91 u[IU]/mL (ref 0.35–5.50)

## 2013-06-21 NOTE — Progress Notes (Signed)
  Subjective:    Patient ID: [name], female    DOB: 1946-01-06, 67 y.o.   MRN: [id number]  HPI Kista is a 67 year old female nonsmoker who comes in today for followup of diabetes  She takes metformin 500 mg a day blood sugars at home averaging about 100. Previous A1c in May of 2014 was 6.0% 6  She takes Tenormin one tablet daily for hypertension BP 122/88 6   Review of Systems Review of systems negative    Objective:   Physical Exam  Well-developed and nourished female no acute distress vital signs stable she's afebrile BP right arm sitting position 122/88 pulse 70 and regular       Assessment & Plan:  Diabetes at goal check A1c  Hypertension at goal continue current medications

## 2013-06-21 NOTE — Patient Instructions (Addendum)
Continue your current medications  We will call you tomorrow about your lab work  Set up a time in May for your annual exam  Fasting labs one week prior  Check your blood sugar once weekly

## 2013-06-25 ENCOUNTER — Ambulatory Visit: Payer: Medicare Other | Admitting: Family Medicine

## 2013-07-30 ENCOUNTER — Telehealth: Payer: Self-pay | Admitting: Family Medicine

## 2013-07-30 NOTE — Telephone Encounter (Signed)
Spoke with patient and she will try OTC and if no improvement within the next couple of days she will call for an appointment

## 2013-07-30 NOTE — Telephone Encounter (Signed)
Pt has poss sinus inf/headache, no sore throat, last seen 11/20.  would like to know if md would call in med for this. No appts pls advise

## 2013-07-31 ENCOUNTER — Encounter (HOSPITAL_COMMUNITY): Payer: Self-pay | Admitting: Emergency Medicine

## 2013-07-31 ENCOUNTER — Emergency Department (HOSPITAL_COMMUNITY)
Admission: EM | Admit: 2013-07-31 | Discharge: 2013-07-31 | Disposition: A | Discharge status: Home / Self Care | Payer: Medicare Other | Source: Home / Self Care | Attending: Family Medicine | Admitting: Family Medicine

## 2013-07-31 DIAGNOSIS — J329 Chronic sinusitis, unspecified: Secondary | ICD-10-CM

## 2013-07-31 MED ORDER — AMOXICILLIN-POT CLAVULANATE 875-125 MG PO TABS: 1.0000 | ORAL_TABLET | Freq: Two times a day (BID) | ORAL | Status: DC

## 2013-07-31 NOTE — ED Notes (Signed)
C/o sinus congestion and headache.  Onset 12-26.  Has used tylenol and claritin and no relief.

## 2013-07-31 NOTE — ED Provider Notes (Signed)
Medical screening examination/treatment/procedure(s) were performed by a resident physician or non-physician practitioner and as the supervising physician I was immediately available for consultation/collaboration.  Greig Altergott, MD    Fallon Haecker S Nohea Kras, MD 07/31/13 2126 

## 2013-07-31 NOTE — ED Provider Notes (Signed)
CSN: [id number]     Arrival date & time 07/31/13  1504 History   First MD Initiated Contact with Patient 07/31/13 1705     Chief Complaint  Patient presents with  . URI   (Consider location/radiation/quality/duration/timing/severity/associated sxs/prior Treatment) Patient is a 67 y.o. female presenting with URI. The history is provided by the patient.  URI Presenting symptoms: congestion, ear pain and facial pain   Presenting symptoms: no fever and no sore throat   Severity:  Moderate Onset quality:  Gradual Duration:  4 days Timing:  Constant Progression:  Worsening Chronicity:  New Relieved by:  Nothing Worsened by:  Nothing tried Associated symptoms: headaches and sinus pain    [name] is a 67 y.o. female who presents with sinus congestion and headache x 4 days. She started with headache 6 days ago and took Claritin and Tylenol and went to bed and the next day felt fine. A day later started having nasal congestion and just feeling bad all over. Took more OTC medication but continues to have pressure over sinuses and nasal congestion and drainage. Had fever off and on for a couple days but none now. She called her PCP office today but could not get an appointment until next week.   Past Medical History  Diagnosis Date  . Allergy   . GERD (gastroesophageal reflux disease)   . Hypertension   . Thyroid disease   . Costochondritis   . S/P TKR (total knee replacement) 09  . S/P TAH (total abdominal hysterectomy)   . Total knee replacement status 09/2007    redo  . Diabetes mellitus without complication     type 2  . Shortness of breath     Hx: of with exertion  . Pneumonia     Hx: of  . Tingling     Hx: of in feet  . Arthritis     RA (Dr. Dareen Piano)  . Osteoarthritis    Past Surgical History  Procedure Laterality Date  . Abdominal hysterectomy  1978  . Right tkr    . Hnp    . Nephrectomy  2010    10.rcc right cancer  . Back surgery      Hx: of   . Colonoscopy  w/ biopsies and polypectomy      Hx: of  . Lumbar laminectomy/decompression microdiscectomy Left 02/12/2013    Procedure: LUMBAR TWO THREE, LUMBAR THREE FOUR, LUMBAR FOUR FIVE  LAMINECTOMY/DECOMPRESSION MICRODISCECTOMY 3 LEVELS;  Surgeon: Temple Pacini, MD;  Location: [facility];  Service: Neurosurgery;  Laterality: Left;   Family History  Problem Relation Age of Onset  . Arthritis Mother     rheumatoid  . Diabetes      family hx  . Arthritis    . Prostate cancer Father   . Heart disease Father    History  Substance Use Topics  . Smoking status: Current Every Day Smoker -- 0.25 packs/day for 10 years    Types: Cigarettes  . Smokeless tobacco: Never Used  . Alcohol Use: No   OB History   Grav Para Term Preterm Abortions TAB SAB Ect Mult Living                 Review of Systems  Constitutional: Negative for fever and chills.  HENT: Positive for congestion, ear pain and sinus pressure. Negative for sore throat and trouble swallowing.   Eyes: Positive for discharge, redness and itching. Negative for pain.  Gastrointestinal: Negative for nausea, vomiting and  abdominal pain.  Genitourinary: Negative for dysuria, frequency and decreased urine volume.  Skin: Negative for rash.  Neurological: Positive for headaches. Negative for syncope.  Psychiatric/Behavioral: Negative for confusion. The patient is not nervous/anxious.     Allergies  Aspirin and Codeine  Home Medications   Current Outpatient Rx  Name  Route  Sig  Dispense  Refill  . atenolol (TENORMIN) 50 MG tablet   Oral   Take 50 mg by mouth daily.         [name] Kitchen atorvastatin (LIPITOR) 20 MG tablet   Oral   Take 20 mg by mouth daily.         . clindamycin (CLEOCIN) 150 MG capsule   Oral   Take 1 capsule (150 mg total) by mouth 3 (three) times daily.   60 capsule   0   . fluticasone (FLONASE) 50 MCG/ACT nasal spray   Nasal   Place 2 sprays into the nose daily.         [name] Kitchen glucose blood test strip      Once  daily for glucose control dx 250.00   100 each   12   . Lancets MISC   Does not apply   by Does not apply route.           . leflunomide (ARAVA) 10 MG tablet   Oral   Take 10 mg by mouth daily.         [name] Kitchen levothyroxine (SYNTHROID, LEVOTHROID) 50 MCG tablet   Oral   Take 50 mcg by mouth daily.         . metFORMIN (GLUCOPHAGE) 500 MG tablet   Oral   Take 500 mg by mouth daily with breakfast.         . pantoprazole (PROTONIX) 40 MG tablet   Oral   Take 40 mg by mouth daily.         . predniSONE (DELTASONE) 5 MG tablet   Oral   Take 5 mg by mouth daily.         . traMADol (ULTRAM) 50 MG tablet   Oral   Take 50 mg by mouth every 8 (eight) hours as needed for pain.          BP 142/64  Pulse 64  Temp(Src) 97.5 F (36.4 C) (Oral)  Resp 16  SpO2 100% Physical Exam  Nursing note and vitals reviewed. Constitutional: She is oriented to person, place, and time. She appears well-developed and well-nourished.  HENT:  Head: Atraumatic.  Right Ear: Tympanic membrane and external ear normal.  Left Ear: Tympanic membrane and external ear normal.  Nose: Rhinorrhea present. Right sinus exhibits maxillary sinus tenderness. Left sinus exhibits maxillary sinus tenderness.  Eyes: EOM are normal.  Neck: Neck supple.  Cardiovascular: Normal rate and regular rhythm.   Pulmonary/Chest: Effort normal and breath sounds normal.  Abdominal: Soft. There is no tenderness.  Musculoskeletal: Normal range of motion.  Neurological: She is alert and oriented to person, place, and time. No cranial nerve deficit.  Skin: Skin is warm and dry.  Psychiatric: She has a normal mood and affect. Her behavior is normal.    ED Course  Procedures  MDM  67 y.o. female with congestion and facial pain, headache. Will treat for sinusitis. Stable for discharge with No concern at this time for [facility] or other headache related abnormalities. Symptoms improve for a period of time with taking tylenol and  Claritin but return. She will continue her OTC medications.  Discussed  with the patient and all questioned fully answered. She will follow up with her PCP or return if any problems arise.   Medication List    TAKE these medications       amoxicillin-clavulanate 875-125 MG per tablet  Commonly known as:  AUGMENTIN  Take 1 tablet by mouth 2 (two) times daily.      ASK your doctor about these medications       atenolol 50 MG tablet  Commonly known as:  TENORMIN  Take 50 mg by mouth daily.     atorvastatin 20 MG tablet  Commonly known as:  LIPITOR  Take 20 mg by mouth daily.     clindamycin 150 MG capsule  Commonly known as:  CLEOCIN  Take 1 capsule (150 mg total) by mouth 3 (three) times daily.     fluticasone 50 MCG/ACT nasal spray  Commonly known as:  FLONASE  Place 2 sprays into the nose daily.     glucose blood test strip  Once daily for glucose control dx 250.00     Lancets Misc  by Does not apply route.     leflunomide 10 MG tablet  Commonly known as:  ARAVA  Take 10 mg by mouth daily.     levothyroxine 50 MCG tablet  Commonly known as:  SYNTHROID, LEVOTHROID  Take 50 mcg by mouth daily.     metFORMIN 500 MG tablet  Commonly known as:  GLUCOPHAGE  Take 500 mg by mouth daily with breakfast.     pantoprazole 40 MG tablet  Commonly known as:  PROTONIX  Take 40 mg by mouth daily.     predniSONE 5 MG tablet  Commonly known as:  DELTASONE  Take 5 mg by mouth daily.     traMADol 50 MG tablet  Commonly known as:  ULTRAM  Take 50 mg by mouth every 8 (eight) hours as needed for pain.            [facility] Orlene Och, [place] 07/31/13 [phone]

## 2013-08-13 ENCOUNTER — Ambulatory Visit (INDEPENDENT_AMBULATORY_CARE_PROVIDER_SITE_OTHER): Payer: Managed Care, Other (non HMO) | Admitting: Family Medicine

## 2013-08-13 ENCOUNTER — Encounter: Payer: Self-pay | Admitting: Family Medicine

## 2013-08-13 VITALS — BP 140/90 | HR 84 | Temp 98.2°F | Wt 212.0 lb

## 2013-08-13 DIAGNOSIS — M069 Rheumatoid arthritis, unspecified: Secondary | ICD-10-CM

## 2013-08-13 DIAGNOSIS — J309 Allergic rhinitis, unspecified: Secondary | ICD-10-CM

## 2013-08-13 MED ORDER — PREDNISONE 20 MG PO TABS: ORAL_TABLET | ORAL | Status: DC

## 2013-08-13 NOTE — Progress Notes (Signed)
   Subjective:    Patient ID: [name], female    DOB: 1945/09/28, 68 y.o.   MRN: [id number]  HPI [name] is a 68 year old female nonsmoker who comes in today but for evaluation of head congestion  On December 30 she went to come in urgent care for evaluation of head congestion. She was told she had sinusitis and was given Augmentin?????????????????. She took the Augmentin for couple days but it caused severe abdominal cramping she stopped it. She has no symptoms of infection. She is head congestion postnasal drip. She has a history of allergic rhinitis.    Review of Systems    review of systems otherwise negative Objective:   Physical Exam Well-developed well-nourished female no acute distress HEENT was pertinent of the nasal septum is in midline she has 3+ nasal edema neck was supple no adenopathy         Assessment & Plan:  Allergic rhinitis plan short course of prednisone

## 2013-08-13 NOTE — Patient Instructions (Signed)
Prednisone 20 mg,,,,,,,, 1 tab x5 days, a half a tab x5 days, then a half a tablet Monday Wednesday Friday for a 3 week taper

## 2013-08-13 NOTE — Progress Notes (Signed)
Pre visit review using our clinic review tool, if applicable. No additional management support is needed unless otherwise documented below in the visit note. 

## 2013-08-31 ENCOUNTER — Other Ambulatory Visit: Payer: Medicare HMO

## 2013-08-31 ENCOUNTER — Encounter: Payer: Self-pay | Admitting: Gastroenterology

## 2013-08-31 ENCOUNTER — Ambulatory Visit (INDEPENDENT_AMBULATORY_CARE_PROVIDER_SITE_OTHER): Payer: Managed Care, Other (non HMO) | Admitting: Gastroenterology

## 2013-08-31 VITALS — BP 126/86 | HR 84 | Ht 66.0 in | Wt 212.8 lb

## 2013-08-31 DIAGNOSIS — R197 Diarrhea, unspecified: Secondary | ICD-10-CM

## 2013-08-31 NOTE — Progress Notes (Signed)
          History of Present Illness:  The patient has returned for reevaluation of diarrhea.  She was seen about a year ago for her diarrhea and minimal rectal bleeding.  Plaquenil was discontinued and diarrhea subsided.  She has had no further bleeding.  Earlier this month she was placed on Biaxin for a sinus infection.  Since that time she's had frequent postprandial diarrhea with lower abdominal discomfort.  She denies rectal bleeding.  She also complains of mild burning upper abdominal pain.  She takes Protonix.    Review of Systems: Pertinent positive and negative review of systems were noted in the above HPI section. All other review of systems were otherwise negative.    Current Medications, Allergies, Past Medical History, Past Surgical History, Family History and Social History were reviewed in [facility] record  Vital signs were reviewed in today's medical record. Physical Exam: General: Well developed , well nourished, no acute distress Skin: anicteric Head: Normocephalic and atraumatic Eyes:  sclerae anicteric, EOMI Ears: Normal auditory acuity Mouth: No deformity or lesions Lungs: Clear throughout to auscultation Heart: Regular rate and rhythm; no murmurs, rubs or bruits Abdomen: Soft, non tender and non distended. No masses, hepatosplenomegaly or hernias noted. Normal Bowel sounds Rectal:deferred Musculoskeletal: Symmetrical with no gross deformities  Pulses:  Normal pulses noted Extremities: No clubbing, cyanosis, edema or deformities noted Neurological: Alert oriented x 4, grossly nonfocal Psychological:  Alert and cooperative. Normal mood and affect  See Assessment and Plan under Problem List

## 2013-08-31 NOTE — Assessment & Plan Note (Addendum)
New onset diarrhea and abdominal discomfort following antibiotic therapy for a sinus infection.  Pseudomembranous colitis and antibiotic-related diarrhea are possibilities.  Recommendations #1 florastor one tab daily for 2 weeks #2 check stool for C. difficile toxin

## 2013-08-31 NOTE — Patient Instructions (Addendum)
You have been given a separate informational sheet regarding your tobacco use, the importance of quitting and local resources to help you quit. Go to the basement for your lab kit Use FlorQ daily Report progress in 2 weeks [phone]

## 2013-09-01 ENCOUNTER — Telehealth: Payer: Self-pay | Admitting: Family Medicine

## 2013-09-01 NOTE — Telephone Encounter (Signed)
Relevant patient education mailed to patient.  

## 2013-09-03 ENCOUNTER — Other Ambulatory Visit: Payer: Medicare HMO

## 2013-09-03 DIAGNOSIS — R197 Diarrhea, unspecified: Secondary | ICD-10-CM

## 2013-09-05 LAB — CLOSTRIDIUM DIFFICILE BY PCR: Toxigenic C. Difficile by PCR: NOT DETECTED

## 2013-10-24 ENCOUNTER — Other Ambulatory Visit (INDEPENDENT_AMBULATORY_CARE_PROVIDER_SITE_OTHER): Payer: Managed Care, Other (non HMO)

## 2013-10-24 ENCOUNTER — Encounter: Payer: Self-pay | Admitting: *Deleted

## 2013-10-24 ENCOUNTER — Ambulatory Visit (INDEPENDENT_AMBULATORY_CARE_PROVIDER_SITE_OTHER): Payer: Managed Care, Other (non HMO) | Admitting: Gastroenterology

## 2013-10-24 VITALS — BP 150/92 | HR 80 | Ht 66.0 in | Wt 211.8 lb

## 2013-10-24 DIAGNOSIS — R197 Diarrhea, unspecified: Secondary | ICD-10-CM

## 2013-10-24 LAB — BASIC METABOLIC PANEL
BUN: 16 mg/dL (ref 6–23)
CO2: 26 mEq/L (ref 19–32)
Calcium: 9.8 mg/dL (ref 8.4–10.5)
Chloride: 104 mEq/L (ref 96–112)
Creatinine, Ser: 1.2 mg/dL (ref 0.4–1.2)
GFR: 57.71 mL/min — ABNORMAL LOW (ref >60.00)
Glucose, Bld: 99 mg/dL (ref 70–99)
Potassium: 3.5 mEq/L (ref 3.5–5.1)
Sodium: 139 mEq/L (ref 135–145)

## 2013-10-24 LAB — CBC WITH DIFFERENTIAL/PLATELET
Basophils Absolute: 0.1 10*3/uL (ref 0.0–0.1)
Basophils Relative: 1.1 % (ref 0.0–3.0)
Eosinophils Absolute: 0.6 10*3/uL (ref 0.0–0.7)
Eosinophils Relative: 8.9 % — ABNORMAL HIGH (ref 0.0–5.0)
HCT: 40.5 % (ref 36.0–46.0)
Hemoglobin: 13.6 g/dL (ref 12.0–15.0)
Lymphocytes Relative: 41.4 % (ref 12.0–46.0)
Lymphs Abs: 2.8 10*3/uL (ref 0.7–4.0)
MCHC: 33.7 g/dL (ref 30.0–36.0)
MCV: 90.1 fl (ref 78.0–100.0)
Monocytes Absolute: 0.7 10*3/uL (ref 0.1–1.0)
Monocytes Relative: 10.6 % (ref 3.0–12.0)
Neutro Abs: 2.6 10*3/uL (ref 1.4–7.7)
Neutrophils Relative %: 38 % — ABNORMAL LOW (ref 43.0–77.0)
Platelets: 170 10*3/uL (ref 150.0–400.0)
RBC: 4.49 Mil/uL (ref 3.87–5.11)
RDW: 14.3 % (ref 11.5–14.6)
WBC: 6.8 10*3/uL (ref 4.5–10.5)

## 2013-10-24 MED ORDER — RESTORA PO CAPS: 1.0000 | ORAL_CAPSULE | Freq: Every day | ORAL | Status: DC

## 2013-10-24 NOTE — Patient Instructions (Signed)
Your physician has requested that you go to the basement for the following lab work before leaving today: CBC  BMP  Please contact your Rheumatologist regarding the new medication you where put on Leflunomide.   We have given you samples of the following medication to take: Restora, please take one capsule by mouth once daily for ten days

## 2013-10-24 NOTE — Progress Notes (Signed)
10/24/2013 [name] [id number] 1945-10-20   History of Present Illness:  Patient is a pleasant 68 year old female who is known to Dr. Deatra Ina and was seen most recently on 08/31/2013 for complaints of diarrhea.  This diarrhea was new and began after taking antibiotics for a sinus infection.  Stool for Cdiff was negative.  She was started on probiotics [employer]film [employer]) for a few weeks and she says that the diarrhea did get better.  She says that it did come back, however, even while still on the probiotic and has been persistent (but not as bad as previously) since then.  Has 3-4 BM's per day; sometimes stools are more firm/solid than others (sometimes has watery stool).  Denies seeing any blood in her stool.    She is on Metformin, but has been on that medication for some time.  After reviewing her medications further, it was discovered that she started taking [place] for her arthritis (prescribed by her rheumatologist) at the beginning of the year prior to developing diarrhea.  I looked at this medication on [facility] and the first side effect listed is diarrhea.  She says that she's had to stop other medications for her arthritis in the past as well due to stomach issues (Plaquenil had to be stopped due to diarrhea).  She says that she has felt somewhat drained from having so many bowel movements every day.  Denies dizziness.  Prior to this issue she was having two solid BM's every day.  Just of note, her last colonoscopy was in November 2008 at which time she did have some colonic polyps removed. These were hyperplastic polyps pathology.   Current Medications, Allergies, Past Medical History, Past Surgical History, Family History and Social History were reviewed in [employer] record.   Physical Exam: BP 150/92  Pulse 80  Ht 5\' 6"  (1.676 m)  Wt 211 lb 12.8 oz (96.072 kg)  BMI 34.20 kg/m2 General: Well developed, black female in no acute distress Head: Normocephalic and  atraumatic Eyes:  Sclerae anicteric, conjunctiva pink  Ears: Normal auditory acuity Lungs: Clear throughout to auscultation Heart: Regular rate and rhythm Abdomen: Soft, non-distended.  Normal bowel sounds.  Non-tender. Musculoskeletal: Symmetrical with no gross deformities  Extremities: No edema  Neurological: Alert oriented x 4, grossly non-focal Psychological:  Alert and cooperative. Normal mood and affect  Assessment and Recommendations: -Diarrhea:  Present since January.  We did review her medications, and she is on metformin, however, she has been on that medication for quite some time. We did realize then, however, that she was started on a new medication for her arthritis called Arava by her rheumatologist at the beginning of the year before the diarrhea began.  The first side effect listed for this medication is diarrhea and she's had to stop other medications for her arthritis in the past due to GI side effects (including Plaquenil, which caused diarrhea).  I have asked her to contact her rheumatologist and see if she could undergo a trial without this medication to see if the diarrhea improves.  We will also start her back on a daily probiotic for now.  Will check CBC and BMP today.  She will be in contact with [place] regarding the outcome of that situation.

## 2013-10-30 ENCOUNTER — Encounter: Payer: Self-pay | Admitting: Family

## 2013-10-30 ENCOUNTER — Ambulatory Visit (INDEPENDENT_AMBULATORY_CARE_PROVIDER_SITE_OTHER): Payer: Managed Care, Other (non HMO) | Admitting: Family

## 2013-10-30 VITALS — BP 142/82 | HR 74 | Temp 97.8°F | Ht 66.0 in | Wt 201.0 lb

## 2013-10-30 DIAGNOSIS — I10 Essential (primary) hypertension: Secondary | ICD-10-CM

## 2013-10-30 DIAGNOSIS — E119 Type 2 diabetes mellitus without complications: Secondary | ICD-10-CM

## 2013-10-30 MED ORDER — LOSARTAN POTASSIUM 50 MG PO TABS: 50.0000 mg | ORAL_TABLET | Freq: Every day | ORAL | Status: DC

## 2013-10-30 NOTE — Progress Notes (Signed)
Pre visit review using our clinic review tool, if applicable. No additional management support is needed unless otherwise documented below in the visit note. 

## 2013-10-30 NOTE — Patient Instructions (Signed)
Sodium-Controlled Diet Sodium is a mineral. It is found in many foods. Sodium may be found naturally or added during the making of a food. The most common form of sodium is salt, which is made up of sodium and chloride. Reducing your sodium intake involves changing your eating habits. The following guidelines will help you reduce the sodium in your diet:  Stop using the salt shaker.  Use salt sparingly in cooking and baking.  Substitute with sodium-free seasonings and spices.  Do not use a salt substitute (potassium chloride) without your caregiver's permission.  Include a variety of fresh, unprocessed foods in your diet.  Limit the use of processed and convenience foods that are high in sodium. USE THE FOLLOWING FOODS SPARINGLY: Breads/Starches  Commercial bread stuffing, commercial pancake or waffle mixes, coating mixes. Waffles. Croutons. Prepared (boxed or frozen) potato, rice, or noodle mixes that contain salt or sodium. Salted French fries or hash browns. Salted popcorn, breads, crackers, chips, or snack foods. Vegetables  Vegetables canned with salt or prepared in cream, butter, or cheese sauces. Sauerkraut. Tomato or vegetable juices canned with salt.  Fresh vegetables are allowed if rinsed thoroughly. Fruit  Fruit is okay to eat. Meat and Meat Substitutes  Salted or smoked meats, such as bacon or Canadian bacon, chipped or corned beef, hot dogs, salt pork, luncheon meats, pastrami, ham, or sausage. Canned or smoked fish, poultry, or meat. Processed cheese or cheese spreads, blue or Roquefort cheese. Battered or frozen fish products. Prepared spaghetti sauce. Baked beans. Reuben sandwiches. Salted nuts. Caviar. Milk  Limit buttermilk to 1 cup per week. Soups and Combination Foods  Bouillon cubes, canned or dried soups, broth, consomm. Convenience (frozen or packaged) dinners with more than 600 mg sodium. Pot pies, pizza, Asian food, fast food cheeseburgers, and specialty  sandwiches. Desserts and Sweets  Regular (salted) desserts, pie, commercial fruit snack pies, commercial snack cakes, canned puddings.  Eat desserts and sweets in moderation. Fats and Oils  Gravy mixes or canned gravy. No more than 1 to 2 tbs of salad dressing. Chip dips.  Eat fats and oils in moderation. Beverages  See those listed under the vegetables and milk groups. Condiments  Ketchup, mustard, meat sauces, salsa, regular (salted) and lite soy sauce or mustard. Dill pickles, olives, meat tenderizer. Prepared horseradish or pickle relish. Dutch-processed cocoa. Baking powder or baking soda used medicinally. Worcestershire sauce. "Light" salt. Salt substitute, unless approved by your caregiver. Document Released: 01/08/2002 Document Revised: 10/11/2011 Document Reviewed: 08/11/2009 [employer] Patient Information 2014 [employer].  

## 2013-10-30 NOTE — Progress Notes (Signed)
Reviewed and agree with management. Colisha Redler D. Shanelle Clontz, M.D., FACG  

## 2013-10-30 NOTE — Progress Notes (Signed)
Subjective:    Patient ID: [name], female    DOB: 1945-09-11, 68 y.o.   MRN: [id number]  HPI 68 year old AAF, nonsmoker, is in today with concerns of elevated blood pressure with a history of Hypertension. Reports checking her blood pressure at home and is getting reading between 160-170.  Currently taking Atenolol daily. Has type 2 diabetes. Reports having 1 kidney.    Review of Systems  Constitutional: Negative.   HENT: Negative.   Respiratory: Negative.   Cardiovascular: Negative.   Gastrointestinal: Negative.   Endocrine: Negative.   Genitourinary: Negative.   Musculoskeletal: Negative.   Skin: Negative.   Psychiatric/Behavioral: Negative.    Past Medical History  Diagnosis Date  . Allergy   . GERD (gastroesophageal reflux disease)   . Hypertension   . Thyroid disease   . Costochondritis   . S/P TKR (total knee replacement) 09  . S/P TAH (total abdominal hysterectomy)   . Total knee replacement status 09/2007    redo  . Diabetes mellitus without complication     type 2  . Shortness of breath     Hx: of with exertion  . Pneumonia     Hx: of  . Tingling     Hx: of in feet  . Arthritis     RA (Dr. Ouida Sills)  . Osteoarthritis   . Gastritis   . Colon polyps 2008    HYPERPLASTIC    History   Social History  . Marital Status: Divorced    Spouse Name: N/A    Number of Children: 3  . Years of Education: N/A   Occupational History  . Retired    Social History Main Topics  . Smoking status: Current Every Day Smoker -- 0.25 packs/day for 10 years    Types: Cigarettes  . Smokeless tobacco: Never Used     Comment: form given 08-31-13  . Alcohol Use: Yes  . Drug Use: No  . Sexual Activity: Not on file   Other Topics Concern  . Not on file   Social History Narrative   Single   Never Smoked    Alcohol use- no   Drug use-no   Regular Exercise-yes   Former Smoker- 12/2008    Past Surgical History  Procedure Laterality Date  . Abdominal  hysterectomy  1978  . Total knee arthroplasty Right   . Hnp    . Nephrectomy Right 2010    10.rcc cancer  . Back surgery      x 2  . Colonoscopy w/ biopsies and polypectomy      Hx: of  . Lumbar laminectomy/decompression microdiscectomy Left 02/12/2013    Procedure: LUMBAR TWO THREE, LUMBAR THREE FOUR, LUMBAR FOUR FIVE  LAMINECTOMY/DECOMPRESSION MICRODISCECTOMY 3 LEVELS;  Surgeon: Charlie Pitter, MD;  Location: [facility];  Service: Neurosurgery;  Laterality: Left;  . Carpal tunnel release Left     Family History  Problem Relation Age of Onset  . Rheum arthritis Mother   . Diabetes Brother     x 2  . Colon cancer Neg Hx   . Prostate cancer Father   . Heart disease Father   . Esophageal cancer Neg Hx   . Pancreatic cancer Neg Hx   . Kidney disease Son   . Liver disease Neg Hx     Allergies  Allergen Reactions  . Aspirin     REACTION: GI upset  . Codeine     REACTION: hyper    Current Outpatient  Prescriptions on File Prior to Visit  Medication Sig Dispense Refill  . atorvastatin (LIPITOR) 20 MG tablet Take 20 mg by mouth daily.      . fluticasone (FLONASE) 50 MCG/ACT nasal spray Place 2 sprays into the nose daily.      [name] Kitchen glucose blood test strip Once daily for glucose control dx 250.00  100 each  12  . Lancets MISC by Does not apply route.        . leflunomide (ARAVA) 10 MG tablet Take 10 mg by mouth daily.      [name] Kitchen levothyroxine (SYNTHROID, LEVOTHROID) 50 MCG tablet Take 50 mcg by mouth daily.      . metFORMIN (GLUCOPHAGE) 500 MG tablet Take 500 mg by mouth daily with breakfast.      . pantoprazole (PROTONIX) 40 MG tablet Take 40 mg by mouth daily.      . Probiotic Product (RESTORA) CAPS Take 1 capsule by mouth daily.  10 capsule  0  . traMADol (ULTRAM) 50 MG tablet Take 50 mg by mouth every 8 (eight) hours as needed for pain.       No current facility-administered medications on file prior to visit.    BP 142/82  Pulse 74  Temp(Src) 97.8 F (36.6 C) (Oral)  Ht 5'  6" (1.676 m)  Wt 201 lb (91.173 kg)  BMI 32.46 kg/m2  SpO2 96%chart     Objective:   Physical Exam  Constitutional: She is oriented to person, place, and time. She appears well-developed and well-nourished.  Neck: Normal range of motion. Neck supple.  Cardiovascular: Normal rate, regular rhythm and normal heart sounds.   Pulmonary/Chest: Effort normal and breath sounds normal.  142/82-BP recheck  Abdominal: Soft. Bowel sounds are normal.  Musculoskeletal: Normal range of motion.  Neurological: She is alert and oriented to person, place, and time.  Skin: Skin is warm and dry.  Psychiatric: She has a normal mood and affect.          Assessment & Plan:  [name] was seen today for no specified reason.  Diagnoses and associated orders for this visit:  Unspecified essential hypertension  Type 2 diabetes mellitus  Other Orders - losartan (COZAAR) 50 MG tablet; Take 1 tablet (50 mg total) by mouth daily.   Due to history of Type 2 DM and concerns of elevated BP, I will change medication and reevaluate in 2 weeks. D/C Atenolol.

## 2013-11-06 ENCOUNTER — Encounter: Payer: Self-pay | Admitting: Family Medicine

## 2013-11-06 ENCOUNTER — Ambulatory Visit (INDEPENDENT_AMBULATORY_CARE_PROVIDER_SITE_OTHER): Payer: Managed Care, Other (non HMO) | Admitting: Family Medicine

## 2013-11-06 VITALS — BP 140/80 | HR 90 | Temp 97.6°F | Wt 214.0 lb

## 2013-11-06 DIAGNOSIS — M48062 Spinal stenosis, lumbar region with neurogenic claudication: Secondary | ICD-10-CM

## 2013-11-06 MED ORDER — TRAMADOL HCL 50 MG PO TABS: 50.0000 mg | ORAL_TABLET | Freq: Three times a day (TID) | ORAL | Status: DC | PRN

## 2013-11-06 MED ORDER — CYCLOBENZAPRINE HCL 5 MG PO TABS: 5.0000 mg | ORAL_TABLET | Freq: Three times a day (TID) | ORAL | Status: DC | PRN

## 2013-11-06 NOTE — Progress Notes (Signed)
   Subjective:    Patient ID: [name], female    DOB: 1946-03-23, 68 y.o.   MRN: [id number]  HPI Desire is a 68 year old female who comes in today for evaluation of right-sided back pain  She states she was here week ago and saw the PA for evaluation of another problem. At that time she does mention she has some back pain. She was advised to take Aleve twice daily. She's done that and it hasn't helped. No history of trauma. She does have a history of lumbar disc disease and previous back surgery. She also has a history of a nephrectomy for renal cell carcinoma.  She describes the pain as constant sharp an 8 on a scale of 1-10 and and rupture sleep. It increases with movement and decreased by rest. It does not radiate.   Review of Systems    review of systems otherwise negative Objective:   Physical Exam  Well-developed well-nourished female no acute distress vital signs stable she is afebrile examinations spine shows no bony tenderness there is a scar from previous lumbar disc surgery. Is palpable tenderness in the right lateral lumbar muscles. Neurologic exam normal      Assessment & Plan:  Right lumbar back pain plan treat symptomatically refer back to neurosurgery if symptoms

## 2013-11-06 NOTE — Patient Instructions (Signed)
Tramadol and Flexeril.............. one of each 3 times daily x3 days  Stay complete bed rest for the next 3 days  After that take a Flexeril and tramadol just at bedtime until the plane is completely gone  If we don't see any improvement in the next week to 10 days call and make an appointment to see a neurosurgeon.

## 2013-11-06 NOTE — Progress Notes (Signed)
Pre visit review using our clinic review tool, if applicable. No additional management support is needed unless otherwise documented below in the visit note. 

## 2013-11-07 ENCOUNTER — Telehealth: Payer: Self-pay | Admitting: Family Medicine

## 2013-11-07 NOTE — Telephone Encounter (Signed)
Relevant patient education mailed to patient.  

## 2013-11-13 ENCOUNTER — Ambulatory Visit: Payer: Managed Care, Other (non HMO) | Admitting: Family Medicine

## 2013-11-22 ENCOUNTER — Encounter: Payer: Self-pay | Admitting: Family Medicine

## 2013-11-22 ENCOUNTER — Ambulatory Visit (INDEPENDENT_AMBULATORY_CARE_PROVIDER_SITE_OTHER): Payer: Managed Care, Other (non HMO) | Admitting: Family Medicine

## 2013-11-22 ENCOUNTER — Telehealth: Payer: Self-pay

## 2013-11-22 VITALS — BP 160/110 | HR 95 | Temp 98.2°F | Wt 196.0 lb

## 2013-11-22 DIAGNOSIS — E119 Type 2 diabetes mellitus without complications: Secondary | ICD-10-CM

## 2013-11-22 DIAGNOSIS — I10 Essential (primary) hypertension: Secondary | ICD-10-CM

## 2013-11-22 MED ORDER — LOSARTAN POTASSIUM-HCTZ 100-12.5 MG PO TABS: 1.0000 | ORAL_TABLET | Freq: Every day | ORAL | Status: DC

## 2013-11-22 NOTE — Progress Notes (Signed)
Pre visit review using our clinic review tool, if applicable. No additional management support is needed unless otherwise documented below in the visit note. 

## 2013-11-22 NOTE — Patient Instructions (Signed)
Hyzaar 100-12.5............. one tablet daily in the morning  Digital pump up blood pressure cuff...........omron  Check your blood pressure daily in the morning  Return in one month for followup

## 2013-11-22 NOTE — Progress Notes (Signed)
   Subjective:    Patient ID: [name], female    DOB: Dec 17, 1945, 68 y.o.   MRN: [id number]  HPI  [name] is a 68 year old female nonsmoker who comes in today for hypertension. Her BP on losartan 50 mg daily is 160/100  Review of Systems Review of systems negative    Objective:   Physical Exam  Well-developed well nourished female no acute distress vital signs stable she's afebrile BP 160/100 right arm sitting position      Assessment & Plan:

## 2013-11-22 NOTE — Telephone Encounter (Signed)
Relevant patient education mailed to patient.  

## 2013-11-23 ENCOUNTER — Telehealth: Payer: Self-pay | Admitting: Family Medicine

## 2013-11-23 NOTE — Telephone Encounter (Signed)
Relevant patient education mailed to patient.  

## 2013-12-11 ENCOUNTER — Other Ambulatory Visit (INDEPENDENT_AMBULATORY_CARE_PROVIDER_SITE_OTHER): Payer: Managed Care, Other (non HMO)

## 2013-12-11 DIAGNOSIS — E119 Type 2 diabetes mellitus without complications: Secondary | ICD-10-CM

## 2013-12-11 LAB — BASIC METABOLIC PANEL
BUN: 14 mg/dL (ref 6–23)
CO2: 27 mEq/L (ref 19–32)
Calcium: 9.6 mg/dL (ref 8.4–10.5)
Chloride: 103 mEq/L (ref 96–112)
Creatinine, Ser: 1.2 mg/dL (ref 0.4–1.2)
GFR: 60.01 mL/min (ref >60.00)
Glucose, Bld: 93 mg/dL (ref 70–99)
Potassium: 3.4 mEq/L — ABNORMAL LOW (ref 3.5–5.1)
Sodium: 138 mEq/L (ref 135–145)

## 2013-12-11 LAB — POCT URINALYSIS DIPSTICK
Bilirubin, UA: NEGATIVE
Blood, UA: NEGATIVE
Glucose, UA: NEGATIVE
Ketones, UA: NEGATIVE
Leukocytes, UA: NEGATIVE
Nitrite, UA: NEGATIVE
Spec Grav, UA: >=1.03
Urobilinogen, UA: 0.2
pH, UA: 5.5

## 2013-12-11 LAB — HEPATIC FUNCTION PANEL
ALT: 23 U/L (ref 0–35)
AST: 34 U/L (ref 0–37)
Albumin: 3.8 g/dL (ref 3.5–5.2)
Alkaline Phosphatase: 65 U/L (ref 39–117)
Bilirubin, Direct: 0 mg/dL (ref 0.0–0.3)
Total Bilirubin: 0.7 mg/dL (ref 0.2–1.2)
Total Protein: 7.7 g/dL (ref 6.0–8.3)

## 2013-12-11 LAB — TSH: TSH: 1.79 u[IU]/mL (ref 0.35–4.50)

## 2013-12-11 LAB — LIPID PANEL
Cholesterol: 181 mg/dL (ref 0–200)
HDL: 63.1 mg/dL (ref >39.00)
LDL Cholesterol: 92 mg/dL (ref 0–99)
Total CHOL/HDL Ratio: 3
Triglycerides: 128 mg/dL (ref 0.0–149.0)
VLDL: 25.6 mg/dL (ref 0.0–40.0)

## 2013-12-11 LAB — MICROALBUMIN / CREATININE URINE RATIO
Creatinine,U: 247.6 mg/dL
Microalb Creat Ratio: 27.8 mg/g (ref 0.0–30.0)
Microalb, Ur: 68.7 mg/dL — ABNORMAL HIGH (ref 0.0–1.9)

## 2013-12-12 ENCOUNTER — Telehealth: Payer: Self-pay | Admitting: *Deleted

## 2013-12-12 LAB — CBC WITH DIFFERENTIAL/PLATELET
Basophils Absolute: 0 10*3/uL (ref 0.0–0.1)
Basophils Relative: 0.7 % (ref 0.0–3.0)
Eosinophils Absolute: 0.5 10*3/uL (ref 0.0–0.7)
Eosinophils Relative: 8.2 % — ABNORMAL HIGH (ref 0.0–5.0)
HCT: 40.2 % (ref 36.0–46.0)
Hemoglobin: 13.4 g/dL (ref 12.0–15.0)
Lymphocytes Relative: 45.9 % (ref 12.0–46.0)
Lymphs Abs: 2.7 10*3/uL (ref 0.7–4.0)
MCHC: 33.5 g/dL (ref 30.0–36.0)
MCV: 89.9 fl (ref 78.0–100.0)
Monocytes Absolute: 0.5 10*3/uL (ref 0.1–1.0)
Monocytes Relative: 8.3 % (ref 3.0–12.0)
Neutro Abs: 2.1 10*3/uL (ref 1.4–7.7)
Neutrophils Relative %: 36.9 % — ABNORMAL LOW (ref 43.0–77.0)
Platelets: 185 10*3/uL (ref 150.0–400.0)
RBC: 4.47 Mil/uL (ref 3.87–5.11)
RDW: 14.6 % (ref 11.5–15.5)
WBC: 5.8 10*3/uL (ref 4.0–10.5)

## 2013-12-12 LAB — HEMOGLOBIN A1C: Hgb A1c MFr Bld: 6.1 % (ref 4.6–6.5)

## 2013-12-12 NOTE — Addendum Note (Signed)
Addended by: Joyce Gross R on: 12/12/2013 09:26 AM   Modules accepted: Orders

## 2013-12-12 NOTE — Addendum Note (Signed)
Addended by: Joyce Gross R on: 12/12/2013 11:37 AM   Modules accepted: Orders

## 2013-12-18 ENCOUNTER — Ambulatory Visit (INDEPENDENT_AMBULATORY_CARE_PROVIDER_SITE_OTHER): Payer: Managed Care, Other (non HMO) | Admitting: Family Medicine

## 2013-12-18 ENCOUNTER — Encounter: Payer: Self-pay | Admitting: Family Medicine

## 2013-12-18 VITALS — BP 110/80 | Temp 98.7°F | Ht 67.0 in | Wt 210.0 lb

## 2013-12-18 DIAGNOSIS — M1990 Unspecified osteoarthritis, unspecified site: Secondary | ICD-10-CM

## 2013-12-18 DIAGNOSIS — K222 Esophageal obstruction: Secondary | ICD-10-CM

## 2013-12-18 DIAGNOSIS — M48062 Spinal stenosis, lumbar region with neurogenic claudication: Secondary | ICD-10-CM

## 2013-12-18 DIAGNOSIS — Z860100 Personal history of colon polyps, unspecified: Secondary | ICD-10-CM

## 2013-12-18 DIAGNOSIS — E119 Type 2 diabetes mellitus without complications: Secondary | ICD-10-CM

## 2013-12-18 DIAGNOSIS — E785 Hyperlipidemia, unspecified: Secondary | ICD-10-CM

## 2013-12-18 DIAGNOSIS — M069 Rheumatoid arthritis, unspecified: Secondary | ICD-10-CM

## 2013-12-18 DIAGNOSIS — M109 Gout, unspecified: Secondary | ICD-10-CM

## 2013-12-18 DIAGNOSIS — J309 Allergic rhinitis, unspecified: Secondary | ICD-10-CM

## 2013-12-18 DIAGNOSIS — K219 Gastro-esophageal reflux disease without esophagitis: Secondary | ICD-10-CM

## 2013-12-18 DIAGNOSIS — E039 Hypothyroidism, unspecified: Secondary | ICD-10-CM

## 2013-12-18 DIAGNOSIS — I10 Essential (primary) hypertension: Secondary | ICD-10-CM

## 2013-12-18 MED ORDER — LEVOTHYROXINE SODIUM 50 MCG PO TABS: 50.0000 ug | ORAL_TABLET | Freq: Every day | ORAL | Status: DC

## 2013-12-18 MED ORDER — LOSARTAN POTASSIUM-HCTZ 100-12.5 MG PO TABS: 1.0000 | ORAL_TABLET | Freq: Every day | ORAL | Status: DC

## 2013-12-18 MED ORDER — ATORVASTATIN CALCIUM 20 MG PO TABS: 20.0000 mg | ORAL_TABLET | Freq: Every day | ORAL | Status: DC

## 2013-12-18 MED ORDER — PANTOPRAZOLE SODIUM 40 MG PO TBEC
40.0000 mg | DELAYED_RELEASE_TABLET | Freq: Every day | ORAL | Status: DC
Start: 2013-12-18 — End: 2014-10-29

## 2013-12-18 MED ORDER — METFORMIN HCL 500 MG PO TABS: 500.0000 mg | ORAL_TABLET | Freq: Every day | ORAL | Status: DC

## 2013-12-18 MED ORDER — CYCLOBENZAPRINE HCL 5 MG PO TABS: ORAL_TABLET | ORAL | Status: DC

## 2013-12-18 MED ORDER — TRAMADOL HCL 50 MG PO TABS: ORAL_TABLET | ORAL | Status: DC

## 2013-12-18 NOTE — Patient Instructions (Signed)
Continue current medications with the exception of............ Flexeril 5 mg.........[name] Kitchen 1 at bedtime when necessary  Tramadol 50 mg,,,,,,,,, one half tab in the morning and one half tab at bedtime daily  Walk 30 minutes daily,,,,,,,,, or join the [facility] and start a water aerobics program 3 times weekly  Followup in 6 months for your diabetes

## 2013-12-18 NOTE — Progress Notes (Signed)
Pre visit review using our clinic review tool, if applicable. No additional management support is needed unless otherwise documented below in the visit note. 

## 2013-12-18 NOTE — Progress Notes (Signed)
   Subjective:    Patient ID: [name], female    DOB: [birth year], 68 y.o.   MRN: [id number]  HPI [name] is a 68 year old female nonsmoker who comes in today for a Medicare wellness examination because of a history of hyperlipidemia, rheumatoid arthritis, hypothyroidism, hypertension, diabetes2, reflux esophagitis, chronic hip and back pain, obesity  Her medications reviewed the been essentially no changes  She gets routine eye care, dental care, BSE monthly, and you mammography, recent colonoscopy showed some polyps but they were removed.  Cognitive function normal she does not exercise on a daily basis home health safety reviewed no issues identified, no guns in the house, she does not have a health care power of attorney nor living well  She had her uterus removed years ago for nonmalignant reasons or for pelvic examinations are no longer indicated  Vaccinations are up dated by Apolonio Schneiders   Review of Systems  Constitutional: Negative.   HENT: Negative.   Eyes: Negative.   Respiratory: Negative.   Cardiovascular: Negative.   Gastrointestinal: Negative.   Genitourinary: Negative.   Musculoskeletal: Negative.   Neurological: Negative.   Psychiatric/Behavioral: Negative.        Objective:   Physical Exam  Nursing note and vitals reviewed. Constitutional: She is oriented to person, place, and time. She appears well-developed and well-nourished.  HENT:  Head: Normocephalic and atraumatic.  Right Ear: External ear normal.  Left Ear: External ear normal.  Nose: Nose normal.  Mouth/Throat: Oropharynx is clear and moist.  Eyes: EOM are normal. Pupils are equal, round, and reactive to light.  Neck: Normal range of motion. Neck supple. No thyromegaly present.  Cardiovascular: Normal rate, regular rhythm, normal heart sounds and intact distal pulses.  Exam reveals no gallop and no friction rub.   No murmur heard. Pulmonary/Chest: Effort normal and breath sounds normal.  Abdominal:  Soft. Bowel sounds are normal. She exhibits no distension and no mass. There is no tenderness. There is no rebound.  Genitourinary:  Bilateral breast exam normal  Musculoskeletal: Normal range of motion. She exhibits no edema and no tenderness.  Swelling right knee was scar from previous knee replacement  Lymphadenopathy:    She has no cervical adenopathy.  Neurological: She is alert and oriented to person, place, and time. She has normal reflexes. No cranial nerve deficit. She exhibits normal muscle tone. Coordination normal.  Skin: Skin is warm and dry.  Psychiatric: She has a normal mood and affect. Her behavior is normal. Judgment and thought content normal.          Assessment & Plan:  Hyperlipidemia goal continue current therapy  Obesity........ again discussed diet exercise and weight loss  Hypertension at goal continue current therapy  Diabetes at goal continue current therapy  Rheumatoid arthritis followed by Dr. Ouida Sills  Hypothyroidism continue Synthroid  Reflux esophagitis continue Protonix  Chronic pain from rheumatoid arthritis Flexeril and tramadol each bedtime when necessary.

## 2013-12-21 ENCOUNTER — Encounter (HOSPITAL_COMMUNITY): Payer: Self-pay | Admitting: Emergency Medicine

## 2013-12-21 ENCOUNTER — Emergency Department (HOSPITAL_COMMUNITY)
Admission: EM | Admit: 2013-12-21 | Discharge: 2013-12-21 | Disposition: A | Discharge status: Home / Self Care | Payer: Medicare HMO | Attending: Emergency Medicine | Admitting: Emergency Medicine

## 2013-12-21 DIAGNOSIS — Z8739 Personal history of other diseases of the musculoskeletal system and connective tissue: Secondary | ICD-10-CM | POA: Insufficient documentation

## 2013-12-21 DIAGNOSIS — E119 Type 2 diabetes mellitus without complications: Secondary | ICD-10-CM | POA: Insufficient documentation

## 2013-12-21 DIAGNOSIS — I10 Essential (primary) hypertension: Secondary | ICD-10-CM | POA: Insufficient documentation

## 2013-12-21 DIAGNOSIS — Z860100 Personal history of colon polyps, unspecified: Secondary | ICD-10-CM | POA: Insufficient documentation

## 2013-12-21 DIAGNOSIS — Z8701 Personal history of pneumonia (recurrent): Secondary | ICD-10-CM | POA: Insufficient documentation

## 2013-12-21 DIAGNOSIS — F17200 Nicotine dependence, unspecified, uncomplicated: Secondary | ICD-10-CM | POA: Insufficient documentation

## 2013-12-21 DIAGNOSIS — Z79899 Other long term (current) drug therapy: Secondary | ICD-10-CM | POA: Insufficient documentation

## 2013-12-21 DIAGNOSIS — K219 Gastro-esophageal reflux disease without esophagitis: Secondary | ICD-10-CM | POA: Insufficient documentation

## 2013-12-21 DIAGNOSIS — IMO0002 Reserved for concepts with insufficient information to code with codable children: Secondary | ICD-10-CM | POA: Insufficient documentation

## 2013-12-21 DIAGNOSIS — E079 Disorder of thyroid, unspecified: Secondary | ICD-10-CM | POA: Insufficient documentation

## 2013-12-21 DIAGNOSIS — M549 Dorsalgia, unspecified: Secondary | ICD-10-CM | POA: Insufficient documentation

## 2013-12-21 MED ORDER — HYDROCODONE-ACETAMINOPHEN 5-325 MG PO TABS: 1.0000 | ORAL_TABLET | Freq: Four times a day (QID) | ORAL | Status: DC | PRN

## 2013-12-21 NOTE — ED Provider Notes (Signed)
CSN: [id number]     Arrival date & time 12/21/13  1628 History   First MD Initiated Contact with Patient 12/21/13 1703     Chief Complaint  Patient presents with  . Back Pain     (Consider location/radiation/quality/duration/timing/severity/associated sxs/prior Treatment) HPI Comments: Pt c/o right sided back pain with movement for the last couple of days. No definite injury. Pt was seen by pcp 3 days ago and was told that it was muscle spasms and given ultram. Pt states that the ultram is not working. Denies numbness, weakness or dysuria. No fever.pt states that she doesn't think the medication is strong enough  The history is provided by the patient. No language interpreter was used.    Past Medical History  Diagnosis Date  . Allergy   . GERD (gastroesophageal reflux disease)   . Hypertension   . Thyroid disease   . Costochondritis   . S/P TKR (total knee replacement) 09  . S/P TAH (total abdominal hysterectomy)   . Total knee replacement status 09/2007    redo  . Diabetes mellitus without complication     type 2  . Shortness of breath     Hx: of with exertion  . Pneumonia     Hx: of  . Tingling     Hx: of in feet  . Arthritis     RA (Dr. Ouida Sills)  . Osteoarthritis   . Gastritis   . Colon polyps 2008    HYPERPLASTIC   Past Surgical History  Procedure Laterality Date  . Abdominal hysterectomy  1978  . Total knee arthroplasty Right   . Hnp    . Nephrectomy Right 2010    10.rcc cancer  . Back surgery      x 2  . Colonoscopy w/ biopsies and polypectomy      Hx: of  . Lumbar laminectomy/decompression microdiscectomy Left 02/12/2013    Procedure: LUMBAR TWO THREE, LUMBAR THREE FOUR, LUMBAR FOUR FIVE  LAMINECTOMY/DECOMPRESSION MICRODISCECTOMY 3 LEVELS;  Surgeon: Charlie Pitter, MD;  Location: [facility];  Service: Neurosurgery;  Laterality: Left;  . Carpal tunnel release Left    Family History  Problem Relation Age of Onset  . Rheum arthritis Mother   . Diabetes  Brother     x 2  . Colon cancer Neg Hx   . Prostate cancer Father   . Heart disease Father   . Esophageal cancer Neg Hx   . Pancreatic cancer Neg Hx   . Kidney disease Son   . Liver disease Neg Hx    History  Substance Use Topics  . Smoking status: Current Every Day Smoker -- 0.25 packs/day for 10 years    Types: Cigarettes  . Smokeless tobacco: Never Used     Comment: form given 08-31-13  . Alcohol Use: Yes   OB History   Grav Para Term Preterm Abortions TAB SAB Ect Mult Living                 Review of Systems  Constitutional: Negative.   Respiratory: Negative.   Cardiovascular: Negative.       Allergies  Aspirin and Codeine  Home Medications   Prior to Admission medications   Medication Sig Start Date End Date Taking? Authorizing Provider  atorvastatin (LIPITOR) 20 MG tablet Take 1 tablet (20 mg total) by mouth daily. 12/18/13 12/18/14  Dorena Cookey, MD  cyclobenzaprine (FLEXERIL) 5 MG tablet 1 by mouth each bedtime when necessary 12/18/13   Jory Ee  Sherren Mocha, MD  fluticasone [facility]) 50 MCG/ACT nasal spray Place 2 sprays into the nose daily. 12/16/11   Dorena Cookey, MD  glucose blood test strip Once daily for glucose control dx 250.00 02/09/13   Dorena Cookey, MD  HYDROcodone-acetaminophen (NORCO/VICODIN) 5-325 MG per tablet Take 1 tablet by mouth every 6 (six) hours as needed. 12/21/13   Glendell Docker, NP  Lancets MISC by Does not apply route.      Historical Provider, MD  leflunomide (ARAVA) 10 MG tablet Take 10 mg by mouth daily.    Historical Provider, MD  levothyroxine (SYNTHROID, LEVOTHROID) 50 MCG tablet Take 1 tablet (50 mcg total) by mouth daily. 12/18/13   Dorena Cookey, MD  losartan-hydrochlorothiazide (HYZAAR) 100-12.5 MG per tablet Take 1 tablet by mouth daily. 12/18/13   Dorena Cookey, MD  metFORMIN (GLUCOPHAGE) 500 MG tablet Take 1 tablet (500 mg total) by mouth daily with breakfast. 12/18/13   Dorena Cookey, MD  pantoprazole (PROTONIX) 40 MG tablet  Take 1 tablet (40 mg total) by mouth daily. 12/18/13   Dorena Cookey, MD  Probiotic Product Mathis Bud) CAPS Take 1 capsule by mouth daily. 10/24/13   Laban Emperor. Zehr, PA-C  traMADol (ULTRAM) 50 MG tablet One half tab twice daily 12/18/13   Dorena Cookey, MD   BP 175/87  Pulse 97  Temp(Src) 97.6 F (36.4 C) (Oral)  Resp 16  SpO2 98% Physical Exam  Nursing note and vitals reviewed. Constitutional: She is oriented to person, place, and time. She appears well-developed and well-nourished.  Cardiovascular: Normal rate and regular rhythm.   Pulmonary/Chest: Effort normal and breath sounds normal.  Abdominal: Soft. Bowel sounds are normal. There is no tenderness.  Musculoskeletal:  Right sided thoracic tenderness. No deformity noted. Pt has full rom. No rash noted  Neurological: She is alert and oriented to person, place, and time. She exhibits normal muscle tone. Coordination normal.  Skin: Skin is warm and dry.  Psychiatric: She has a normal mood and affect.    ED Course  Procedures (including critical care time) Labs Review Labs Reviewed - No data to display  Imaging Review No results found.   EKG Interpretation None      MDM   Final diagnoses:  Back pain   Will switch to hydrocodone.neurologically intact   Glendell Docker, NP 12/21/13 1825

## 2013-12-21 NOTE — Discharge Instructions (Signed)
Back Pain, Adult  Back pain is very common. The pain often gets better over time. The cause of back pain is usually not dangerous. Most people can learn to manage their back pain on their own.   HOME CARE   · Stay active. Start with short walks on flat ground if you can. Try to walk farther each day.  · Do not sit, drive, or stand in one place for more than 30 minutes. Do not stay in bed.  · Do not avoid exercise or work. Activity can help your back heal faster.  · Be careful when you bend or lift an object. Bend at your knees, keep the object close to you, and do not twist.  · Sleep on a firm mattress. Lie on your side, and bend your knees. If you lie on your back, put a pillow under your knees.  · Only take medicines as told by your doctor.  · Put ice on the injured area.  · Put ice in a plastic bag.  · Place a towel between your skin and the bag.  · Leave the ice on for 15-20 minutes, 03-04 times a day for the first 2 to 3 days. After that, you can switch between ice and heat packs.  · Ask your doctor about back exercises or massage.  · Avoid feeling anxious or stressed. Find good ways to deal with stress, such as exercise.  GET HELP RIGHT AWAY IF:   · Your pain does not go away with rest or medicine.  · Your pain does not go away in 1 week.  · You have new problems.  · You do not feel well.  · The pain spreads into your legs.  · You cannot control when you poop (bowel movement) or pee (urinate).  · Your arms or legs feel weak or lose feeling (numbness).  · You feel sick to your stomach (nauseous) or throw up (vomit).  · You have belly (abdominal) pain.  · You feel like you may pass out (faint).  MAKE SURE YOU:   · Understand these instructions.  · Will watch your condition.  · Will get help right away if you are not doing well or get worse.  Document Released: 01/05/2008 Document Revised: 10/11/2011 Document Reviewed: 12/07/2010  Exit[employer]® Patient Information ©2014 [employer].

## 2013-12-21 NOTE — ED Provider Notes (Signed)
Medical screening examination/treatment/procedure(s) were performed by non-physician practitioner and as supervising physician I was immediately available for consultation/collaboration.   Rayma Hegg T Nansi Birmingham, MD 12/21/13 2234 

## 2013-12-21 NOTE — ED Notes (Signed)
Pt seen PCP diagnosed with muscle spasms and given tramadol. Pt here c/o right side mid back pain.

## 2014-01-09 ENCOUNTER — Encounter: Payer: Self-pay | Admitting: Family Medicine

## 2014-01-09 LAB — HM DIABETES EYE EXAM

## 2014-03-06 ENCOUNTER — Encounter: Payer: Self-pay | Admitting: Family Medicine

## 2014-03-06 ENCOUNTER — Ambulatory Visit (INDEPENDENT_AMBULATORY_CARE_PROVIDER_SITE_OTHER): Payer: Medicare HMO | Admitting: Family Medicine

## 2014-03-06 VITALS — BP 148/84 | HR 88 | Temp 99.8°F | Ht 67.0 in | Wt 205.0 lb

## 2014-03-06 DIAGNOSIS — J069 Acute upper respiratory infection, unspecified: Secondary | ICD-10-CM

## 2014-03-06 DIAGNOSIS — I10 Essential (primary) hypertension: Secondary | ICD-10-CM

## 2014-03-06 MED ORDER — BENZONATATE 100 MG PO CAPS: 100.0000 mg | ORAL_CAPSULE | Freq: Two times a day (BID) | ORAL | Status: DC | PRN

## 2014-03-06 MED ORDER — DOXYCYCLINE HYCLATE 100 MG PO TABS: 100.0000 mg | ORAL_TABLET | Freq: Two times a day (BID) | ORAL | Status: DC

## 2014-03-06 NOTE — Patient Instructions (Addendum)
-  afrin nasal spray for 4 days then stop  -plenty of fluids and rest  -nasal saline  -do your flonase and claritin daily for 21 days  -try the tessalon for cough  -As we discussed, we have prescribed a new medication for you at this appointment. We discussed the common and serious potential adverse effects of this medication and you can review these and more with the pharmacist when you pick up your medication.  Please follow the instructions for use carefully and notify us immediately if you have any problems taking this medication.  -please let your rheumatologist know today that you are taking an antibiotic for a respiratory infection  -follow up if fevers, worsening, trouble breathing or other concerns or not getting better as expected

## 2014-03-06 NOTE — Progress Notes (Signed)
Pre visit review using our clinic review tool, if applicable. No additional management support is needed unless otherwise documented below in the visit note. 

## 2014-03-06 NOTE — Progress Notes (Signed)
No chief complaint on file.   HPI:  -started: 3 days ago -symptoms:nasal congestion, sore throat, cough, mild wheezing, fatigue -denies:fever, SOB, NVD, tooth pain -has tried: flonase on and off -sick contacts/travel/risks: denies flu exposure, tick exposure or or Ebola risks -Hx of: allergies, no lung disease, reports because of her immunotherapy has to take abx when sick, reports can not take anything with hydrocodone or codiene  HTN: -did not take medications today - forgot because was on the phone -reports bp is great at home  ROS: See pertinent positives and negatives per HPI.  Past Medical History  Diagnosis Date  . Allergy   . GERD (gastroesophageal reflux disease)   . Hypertension   . Thyroid disease   . Costochondritis   . S/P TKR (total knee replacement) 09  . S/P TAH (total abdominal hysterectomy)   . Total knee replacement status 09/2007    redo  . Diabetes mellitus without complication     type 2  . Shortness of breath     Hx: of with exertion  . Pneumonia     Hx: of  . Tingling     Hx: of in feet  . Arthritis     RA (Dr. Ouida Sills)  . Osteoarthritis   . Gastritis   . Colon polyps 2008    HYPERPLASTIC    Past Surgical History  Procedure Laterality Date  . Abdominal hysterectomy  1978  . Total knee arthroplasty Right   . Hnp    . Nephrectomy Right 2010    10.rcc cancer  . Back surgery      x 2  . Colonoscopy w/ biopsies and polypectomy      Hx: of  . Lumbar laminectomy/decompression microdiscectomy Left 02/12/2013    Procedure: LUMBAR TWO THREE, LUMBAR THREE FOUR, LUMBAR FOUR FIVE  LAMINECTOMY/DECOMPRESSION MICRODISCECTOMY 3 LEVELS;  Surgeon: Charlie Pitter, MD;  Location: [facility];  Service: Neurosurgery;  Laterality: Left;  . Carpal tunnel release Left     Family History  Problem Relation Age of Onset  . Rheum arthritis Mother   . Diabetes Brother     x 2  . Colon cancer Neg Hx   . Prostate cancer Father   . Heart disease Father   .  Esophageal cancer Neg Hx   . Pancreatic cancer Neg Hx   . Kidney disease Son   . Liver disease Neg Hx     History   Social History  . Marital Status: Divorced    Spouse Name: N/A    Number of Children: 3  . Years of Education: N/A   Occupational History  . Retired    Social History Main Topics  . Smoking status: Current Every Day Smoker -- 0.25 packs/day for 10 years    Types: Cigarettes  . Smokeless tobacco: Never Used     Comment: form given 08-31-13  . Alcohol Use: Yes  . Drug Use: No  . Sexual Activity: None   Other Topics Concern  . None   Social History Narrative   Single   Never Smoked    Alcohol use- no   Drug use-no   Regular Exercise-yes   Former Smoker- 12/2008    Current outpatient prescriptions:atorvastatin (LIPITOR) 20 MG tablet, Take 1 tablet (20 mg total) by mouth daily., Disp: 100 tablet, Rfl: 3;  fluticasone (FLONASE) 50 MCG/ACT nasal spray, Place 2 sprays into the nose daily., Disp: , Rfl: ;  glucose blood test strip, Once daily for glucose control dx  250.00, Disp: 100 each, Rfl: 12 HYDROcodone-acetaminophen (NORCO/VICODIN) 5-325 MG per tablet, Take 1 tablet by mouth every 6 (six) hours as needed., Disp: 15 tablet, Rfl: 0;  hydroxychloroquine (PLAQUENIL) 200 MG tablet, Take 200 mg by mouth daily. Take 2 by mouth daily, Disp: , Rfl: ;  Lancets MISC, by Does not apply route.  , Disp: , Rfl: ;  leflunomide (ARAVA) 10 MG tablet, Take 10 mg by mouth daily., Disp: , Rfl:  levothyroxine (SYNTHROID, LEVOTHROID) 50 MCG tablet, Take 1 tablet (50 mcg total) by mouth daily., Disp: 100 tablet, Rfl: 3;  losartan-hydrochlorothiazide (HYZAAR) 100-12.5 MG per tablet, Take 1 tablet by mouth daily., Disp: 100 tablet, Rfl: 3;  metFORMIN (GLUCOPHAGE) 500 MG tablet, Take 1 tablet (500 mg total) by mouth daily with breakfast., Disp: 100 tablet, Rfl: 3 pantoprazole (PROTONIX) 40 MG tablet, Take 1 tablet (40 mg total) by mouth daily., Disp: 100 tablet, Rfl: 3;  Probiotic Product  (RESTORA) CAPS, Take 1 capsule by mouth daily., Disp: 10 capsule, Rfl: 0;  benzonatate (TESSALON) 100 MG capsule, Take 1 capsule (100 mg total) by mouth 2 (two) times daily as needed for cough., Disp: 20 capsule, Rfl: 0 doxycycline (VIBRA-TABS) 100 MG tablet, Take 1 tablet (100 mg total) by mouth 2 (two) times daily., Disp: 20 tablet, Rfl: 0  EXAM:  Filed Vitals:   03/06/14 1121  BP: 148/84  Pulse: 88  Temp: 99.8 F (37.7 C)    Body mass index is 32.1 kg/(m^2).  GENERAL: vitals reviewed and listed above, alert, oriented, appears well hydrated and in no acute distress  HEENT: atraumatic, conjunttiva clear, no obvious abnormalities on inspection of external nose and ears, normal appearance of ear canals and TMs, white nasal congestion, mild post oropharyngeal erythema with PND, no tonsillar edema or exudate, no sinus TTP  NECK: no obvious masses on inspection  LUNGS: clear to auscultation bilaterally, no wheezes, rales or rhonchi, good air movement  CV: HRRR, no peripheral edema  MS: moves all extremities without noticeable abnormality  PSYCH: pleasant and cooperative, no obvious depression or anxiety  ASSESSMENT AND PLAN:  Discussed the following assessment and plan:  Acute upper respiratory infections of unspecified site - Plan: hydroxychloroquine (PLAQUENIL) 200 MG tablet, benzonatate (TESSALON) 100 MG capsule, doxycycline (VIBRA-TABS) 100 MG tablet  Essential hypertension - Plan: hydroxychloroquine (PLAQUENIL) 200 MG tablet, benzonatate (TESSALON) 100 MG capsule, doxycycline (VIBRA-TABS) 100 MG tablet  -We discussed potential etiologies, with VURI being most likely - however with her immunosuppressed state, low grade fever with cover with doxy.  -We discussed treatment side effects, likely course, antibiotic misuse, transmission, and signs of developing a serious illness. -of course, we advised to return or notify a doctor immediately if symptoms worsen or persist or new  concerns arise.    Patient Instructions  -afrin nasal spray for 4 days then stop  -plenty of fluids and rest  -nasal saline  -do your flonase and claritin daily for 21 days  -try the tessalon for cough  -follow up if fevers, worsening, trouble breathing or other concerns or not getting better as expected     KIM, Jarrett Soho R.

## 2014-03-13 ENCOUNTER — Ambulatory Visit (INDEPENDENT_AMBULATORY_CARE_PROVIDER_SITE_OTHER): Payer: Medicare HMO | Admitting: Family Medicine

## 2014-03-13 ENCOUNTER — Encounter: Payer: Self-pay | Admitting: Family Medicine

## 2014-03-13 ENCOUNTER — Ambulatory Visit (INDEPENDENT_AMBULATORY_CARE_PROVIDER_SITE_OTHER)
Admission: RE | Admit: 2014-03-13 | Discharge: 2014-03-13 | Disposition: A | Discharge status: Home / Self Care | Payer: Medicare HMO | Source: Ambulatory Visit | Attending: Family Medicine | Admitting: Family Medicine

## 2014-03-13 VITALS — BP 120/80 | Temp 98.5°F | Wt 204.0 lb

## 2014-03-13 DIAGNOSIS — J45901 Unspecified asthma with (acute) exacerbation: Secondary | ICD-10-CM

## 2014-03-13 MED ORDER — PREDNISONE 20 MG PO TABS: ORAL_TABLET | ORAL | Status: DC

## 2014-03-13 MED ORDER — HYDROCODONE-HOMATROPINE 5-1.5 MG/5ML PO SYRP: 5.0000 mL | ORAL_SOLUTION | Freq: Three times a day (TID) | ORAL | Status: DC | PRN

## 2014-03-13 NOTE — Patient Instructions (Signed)
Go to the main office now for your chest x-ray  Hydromet,,,,,,,,,,,, 1/2-1 teaspoon 3 times daily  Rest at home  Drink lots of liquids  Prednisone 20 mg,,,,,,,,,,,,,,, 2 tabs daily,,,,,,,,,,,,,,,,,, return on Monday for followup

## 2014-03-13 NOTE — Progress Notes (Signed)
Pre visit review using our clinic review tool, if applicable. No additional management support is needed unless otherwise documented below in the visit note. 

## 2014-03-13 NOTE — Progress Notes (Signed)
   Subjective:    Patient ID: [name], female    DOB: 01/21/1947, 68 y.o.   MRN: [id number]  HPI Shanika is a 68 year old female nonsmoker who comes in today because of viral syndrome for 2 weeks  She's had a viral infection for 2 weeks. She came and saw Dr. Maudie Mercury last week and was given doxycycline because of her older new system. That antibiotics have not affected the cough.  She has no fever chills earache sore throat.   Review of Systems    review of systems otherwise negative Objective:   Physical Exam  Well-developed and nourished female no acute distress vital signs stable she's afebrile HEENT negative neck was supple no adenopathy lungs are clear except for symmetrical mild to moderate wheezing bilaterally      Assessment & Plan:  Viral syndrome with secondary asthma,,,,,,,,,,, chest x-ray,,,,,,,, prednisone burst and taper,

## 2014-03-18 ENCOUNTER — Encounter: Payer: Self-pay | Admitting: Family Medicine

## 2014-03-18 ENCOUNTER — Ambulatory Visit (INDEPENDENT_AMBULATORY_CARE_PROVIDER_SITE_OTHER): Payer: Medicare HMO | Admitting: Family Medicine

## 2014-03-18 VITALS — BP 130/80

## 2014-03-18 DIAGNOSIS — J4521 Mild intermittent asthma with (acute) exacerbation: Secondary | ICD-10-CM

## 2014-03-18 DIAGNOSIS — J45901 Unspecified asthma with (acute) exacerbation: Secondary | ICD-10-CM

## 2014-03-18 NOTE — Patient Instructions (Signed)
Prednisone 20 mg.............[name] Kitchen 1 tab today, Tuesday, Wednesday, Thursday,,,,,,,,,,,,,,,,, a half a tablet Friday Saturday Sunday,,,,,,,,,,,, then a half a tablet Monday Wednesday Friday for a two-week taper

## 2014-03-18 NOTE — Progress Notes (Signed)
   Subjective:    Patient ID: [name], female    DOB: 01/21/1947, 68 y.o.   MRN: [id number]  HPI Vola is a 68 year old female nonsmoker who comes in today for followup of asthma  We saw her last week with a flare of her asthma and we started prednisone 40 mg a day. Today she dropped down to 20 mg because she felt so much better.   Review of Systems    no side effects from medication Objective:   Physical Exam  Well-developed well-nourished female no acute distress vital signs stable she is afebrile HEENT negative neck was supple no adenopathy lungs are clear      Assessment & Plan:  Asthma resolving,,,,,,,,,,,,,, taper prednisone as outlined,

## 2014-03-18 NOTE — Progress Notes (Signed)
Pre visit review using our clinic review tool, if applicable. No additional management support is needed unless otherwise documented below in the visit note. 

## 2014-03-23 ENCOUNTER — Encounter: Payer: Self-pay | Admitting: Gastroenterology

## 2014-05-03 LAB — HM MAMMOGRAPHY: HM Mammogram: NORMAL

## 2014-05-07 ENCOUNTER — Encounter: Payer: Self-pay | Admitting: Family Medicine

## 2014-05-07 ENCOUNTER — Other Ambulatory Visit: Payer: Self-pay | Admitting: Family Medicine

## 2014-05-07 MED ORDER — GLUCOSE BLOOD VI STRP
ORAL_STRIP | Status: DC
Start: 2014-05-07 — End: 2015-06-23

## 2014-06-04 ENCOUNTER — Telehealth: Payer: Self-pay | Admitting: Family Medicine

## 2014-06-04 NOTE — Telephone Encounter (Signed)
Pt called to say that she woke up with a cold, chest congestion,cough, she is asking if something can be called in for her or do she need to come in.

## 2014-06-04 NOTE — Telephone Encounter (Signed)
Hydromet per Dr Sherren Mocha.  Spoke with patient and she already has some Hydromet.  She will try that and call back if needed.

## 2014-06-13 ENCOUNTER — Other Ambulatory Visit: Payer: Managed Care, Other (non HMO)

## 2014-06-13 ENCOUNTER — Ambulatory Visit: Payer: Managed Care, Other (non HMO)

## 2014-06-13 ENCOUNTER — Other Ambulatory Visit (INDEPENDENT_AMBULATORY_CARE_PROVIDER_SITE_OTHER): Payer: Medicare HMO

## 2014-06-13 DIAGNOSIS — E119 Type 2 diabetes mellitus without complications: Secondary | ICD-10-CM

## 2014-06-13 LAB — BASIC METABOLIC PANEL
BUN: 19 mg/dL (ref 6–23)
CO2: 25 mEq/L (ref 19–32)
Calcium: 9.6 mg/dL (ref 8.4–10.5)
Chloride: 105 mEq/L (ref 96–112)
Creatinine, Ser: 1.3 mg/dL — ABNORMAL HIGH (ref 0.4–1.2)
GFR: 53.44 mL/min — ABNORMAL LOW (ref >60.00)
Glucose, Bld: 90 mg/dL (ref 70–99)
Potassium: 3.4 mEq/L — ABNORMAL LOW (ref 3.5–5.1)
Sodium: 140 mEq/L (ref 135–145)

## 2014-06-13 LAB — MICROALBUMIN / CREATININE URINE RATIO
Creatinine,U: 188.9 mg/dL
Microalb Creat Ratio: 16.5 mg/g (ref 0.0–30.0)
Microalb, Ur: 31.1 mg/dL — ABNORMAL HIGH (ref 0.0–1.9)

## 2014-06-13 LAB — HEMOGLOBIN A1C: Hgb A1c MFr Bld: 5.6 % (ref 4.6–6.5)

## 2014-06-20 ENCOUNTER — Encounter: Payer: Self-pay | Admitting: Family Medicine

## 2014-06-20 ENCOUNTER — Ambulatory Visit (INDEPENDENT_AMBULATORY_CARE_PROVIDER_SITE_OTHER): Payer: Medicare HMO | Admitting: Family Medicine

## 2014-06-20 VITALS — BP 130/90 | Temp 97.9°F | Wt 199.0 lb

## 2014-06-20 DIAGNOSIS — E139 Other specified diabetes mellitus without complications: Secondary | ICD-10-CM

## 2014-06-20 DIAGNOSIS — Z23 Encounter for immunization: Secondary | ICD-10-CM

## 2014-06-20 NOTE — Progress Notes (Signed)
   Subjective:    Patient ID: [name], female    DOB: 1946-04-11, 68 y.o.   MRN: [id number]  HPI [name] is a 68 year old female nonsmoker who comes in today for follow-up of her diabetes  She takes metformin 500 mg daily. Her A1c 6 days ago was 5.6%. She's lost 11 pounds since last year.  She is currently seeing Dr. Trenton Gammon neurosurgeon for evaluation of back pain. She states she otherwise feels well  She also has underlying hypertension on Hyzaar 100-12 0.5 daily,,,,,,, BP 130/90   Review of Systems    review of systems otherwise negative Objective:   Physical Exam  Well-developed well-nourished female no acute distress weight down to 199  Vital signs stable BP 130/90,,,,,,,,,, she says is 120/80 at home      Assessment & Plan:  Diabetes type 2.......[name] Kitchen much improved control with diet exercise and weight loss....... decrease metformin to 250 mg daily  Follow-up in May  Hypertension ago continue current therapy

## 2014-06-20 NOTE — Patient Instructions (Signed)
Continue your current diet exercise and weight loss program  Decrease her metformin to one half tablet daily  Return in May for your annual physical exam  Labs one week prior

## 2014-06-20 NOTE — Progress Notes (Signed)
Pre visit review using our clinic review tool, if applicable. No additional management support is needed unless otherwise documented below in the visit note. Lab Results  Component Value Date   HGBA1C 5.6 06/13/2014   HGBA1C 6.1 12/12/2013   HGBA1C 6.0 12/11/2012   Lab Results  Component Value Date   MICROALBUR 31.1* 06/13/2014   LDLCALC 92 12/11/2013   CREATININE 1.3* 06/13/2014    

## 2014-07-11 ENCOUNTER — Ambulatory Visit: Payer: Medicare HMO | Attending: Neurosurgery

## 2014-07-11 DIAGNOSIS — M1990 Unspecified osteoarthritis, unspecified site: Secondary | ICD-10-CM | POA: Insufficient documentation

## 2014-07-11 DIAGNOSIS — M1612 Unilateral primary osteoarthritis, left hip: Secondary | ICD-10-CM

## 2014-07-11 NOTE — Patient Instructions (Signed)
Heel Slides   Squeeze pelvic floor and hold. Slide left heel along bed towards bottom. Hold for _2__ seconds. Slide back to flat knee position. Repeat  10___ times. Do __3_ times a day. Repeat with other leg.    Copyright  VHI. All rights reserved.

## 2014-07-11 NOTE — Therapy (Signed)
Outpatient Rehabilitation [facility] [address] [place], [place], [address] Phone: [phone]   Fax:  [phone]  Physical Therapy Evaluation  Patient Details  Name: [name] MRN: [id number] Date of Birth: 09/13/1945  Encounter Date: 07/11/2014      PT End of Session - 07/11/14 1632    Visit Number 1   Number of Visits 12   Date for PT Re-Evaluation 08/21/14   PT Start Time 1500   PT Stop Time 1545   PT Time Calculation (min) 45 min   Activity Tolerance Patient tolerated treatment well      Past Medical History  Diagnosis Date  . Allergy   . GERD (gastroesophageal reflux disease)   . Hypertension   . Thyroid disease   . Costochondritis   . S/P TKR (total knee replacement) 09  . S/P TAH (total abdominal hysterectomy)   . Total knee replacement status 09/2007    redo  . Diabetes mellitus without complication     type 2  . Shortness of breath     Hx: of with exertion  . Pneumonia     Hx: of  . Tingling     Hx: of in feet  . Arthritis     RA (Dr. Ouida Sills)  . Osteoarthritis   . Gastritis   . Colon polyps 2008    HYPERPLASTIC    Past Surgical History  Procedure Laterality Date  . Abdominal hysterectomy  1978  . Total knee arthroplasty Right   . Hnp    . Nephrectomy Right 2010    10.rcc cancer  . Back surgery      x 2  . Colonoscopy w/ biopsies and polypectomy      Hx: of  . Lumbar laminectomy/decompression microdiscectomy Left 02/12/2013    Procedure: LUMBAR TWO THREE, LUMBAR THREE FOUR, LUMBAR FOUR FIVE  LAMINECTOMY/DECOMPRESSION MICRODISCECTOMY 3 LEVELS;  Surgeon: Charlie Pitter, MD;  Location: [place] [facility];  Service: Neurosurgery;  Laterality: Left;  . Carpal tunnel release Left     There were no vitals taken for this visit.  Visit Diagnosis:  Arthritis of left hip - Plan: PT plan of care cert/re-cert      Subjective Assessment - 07/11/14 1512    Symptoms Pt c/o of L hip tightness and pain. Started in October. Worse with  standing and walking and when lying flat. R hip also feels tightness and pain. Pt describes "knots" in thighs. Pt had cortisone shots in past with good relief for 6 months, but last cortisone shot in Aug was not effective.    Pertinent History Back surgery x 3 , HTN, DM   Limitations Standing;Walking   How long can you sit comfortably? 30 mins   How long can you stand comfortably? 10 mins   How long can you walk comfortably? 10 mins   Diagnostic tests L hip X ray showed arthritis   Currently in Pain? Yes   Pain Score 7    Pain Location Hip   Pain Orientation Left   Pain Descriptors / Indicators Sharp;Aching   Pain Type Chronic pain   Pain Radiating Towards toward L foot   Pain Onset More than a month ago   Pain Frequency Intermittent   Aggravating Factors  standing and walking           [place] PT Assessment - 07/11/14 0001    Assessment   Medical Diagnosis L hip arthritis   Onset Date 05/11/14   Next MD Visit unknown  Prior Therapy none, cortisone shot   Precautions   Precautions None   Restrictions   Weight Bearing Restrictions No   Balance Screen   Has the patient fallen in the past 6 months No   [place] residence   Prior Function   Level of Independence Independent with basic ADLs   Observation/Other Assessments   Observations TP at L hip flexor, ITB, Upper glute, quad   Other Surveys  Select   Lower Extremity Functional Scale  15/80   AROM   Right Hip Extension -13   Right Hip Flexion 110   Right Hip External Rotation  30   Right Hip Internal Rotation  20   Right Hip ABduction 15   Left Hip Extension -23  ERP   Left Hip Flexion 90  ERP   Left Hip External Rotation  20   Left Hip Internal Rotation  20   Left Hip ABduction 15   Lumbar Flexion 56   Lumbar Extension 10   Lumbar - Right Side Bend 5   Lumbar - Left Side Bend 5   Strength   Right Hip Flexion 3+/5   Right Hip ABduction 2+/5   Left Hip Flexion 2+/5   Left  Hip ABduction 3/5   Flexibility   Soft Tissue Assessment /Muscle Lenght yes   Quadriceps lacking hip extension from neutral   Special Tests    Special Tests Hip Special Tests   Hip Special Tests  Saralyn Pilar (FABER) Test;Thomas Test;Hip Scouring   Saralyn Pilar (FABER) Test   Findings Positive   Side Left   Thomas Test    Findings Positive   Side Left   Hip Scouring   Findings Positive   Side Left   Ambulation/Gait   Ambulation/Gait Yes   Ambulation/Gait Assistance 6: Modified independent (Device/Increase time)   Assistive device --  Gait training with cane and instruction for use   Gait Pattern Decreased step length - right;Decreased stance time - left;Ataxic          [facility] Adult PT Treatment/Exercise - 07/11/14 0001    Exercises   Exercises Knee/Hip   Knee/Hip Exercises: Supine   Heel Slides AROM;10 reps   Heel Slides Limitations HEP          PT Education - 07/11/14 1544    Education provided Yes   Education Details Cane, heel slides for HEP, activity restrictions.           PT Short Term Goals - 07/11/14 1636    PT SHORT TERM GOAL #1   Title "Independent with initial HEP   Time 2   Period Weeks   Status New   PT SHORT TERM GOAL #2   Title "Report pain decrease at rest from  7 /10 to  3 /10.   Time 2   Period Weeks   Status New          PT Long Term Goals - 07/11/14 1637    PT LONG TERM GOAL #1   Title "Pain will decrease to 1/10 with all functional activities.   Time 6   Status New   PT LONG TERM GOAL #2   Title "L hip extension will improve to -10 for improved tolerance to standing and walking for 1 hour without increased pain in order to return to PLOF.   Time 6   Period Weeks   Status New   PT LONG TERM GOAL #3   Title LEFS will improve from  15/ 80  to  30/ 80   indicating improved functional mobility.   Time 6   Period Weeks   Status New   PT LONG TERM GOAL #4   Title L hip flexion and ABD strength will improve to 3+/5 for improved ability to  transfer into and our of car with less difficulty and less pain.    Time 6   Period Weeks   Status New          Plan - July 24, 2014 1633    Clinical Impression Statement Pt presents with signs and symptoms compatible with L hip arthritis. Impairments include pain, knee weakness, decreased ROM, difficulty with walking, stairs, and with transfers . Pt would benefit from skilled PT for 2 times a week for 6 weeks to address above impariments and functional limitations and return to pain-free PLOF.   Pt will benefit from skilled therapeutic intervention in order to improve on the following deficits Abnormal gait;Decreased range of motion;Difficulty walking;Impaired flexibility;Decreased strength;Decreased mobility;Pain;Decreased knowledge of use of DME;Decreased activity tolerance;Decreased endurance   Rehab Potential Good   PT Frequency 2x / week   PT Duration 6 weeks   PT Treatment/Interventions ADLs/Self Care Home Management;Electrical Stimulation;Cryotherapy;Moist Heat;Therapeutic activities;Therapeutic exercise;Patient/family education;Gait training;DME Instruction;Stair training;Neuromuscular re-education;Manual techniques;Dry needling   PT Next Visit Plan Stretches, manual therapy for joint mobs, gentle strengthening.    Consulted and Agree with Plan of Care Patient          G-Codes - 07-24-2014 1642    Functional Assessment Tool Used LEFS and clinical judgement, pain, difficulty with walking   Functional Limitation Mobility: Walking and moving around   Mobility: Walking and Moving Around Current Status [phone]) At least 60 percent but less than 80 percent impaired, limited or restricted   Mobility: Walking and Moving Around Goal Status [phone]) At least 20 percent but less than 40 percent impaired, limited or restricted       Problem List Patient Active Problem List   Diagnosis Date Noted  . Asthma with acute exacerbation 03/13/2014  . Diarrhea 08/31/2013  . Spinal stenosis, lumbar  region, with neurogenic claudication 02/12/2013  . Diabetes 1.5, managed as type 2 03/05/2010  . RENAL CELL CANCER 01/14/2009  . PERSONAL HX COLONIC POLYPS 11/14/2008  . HYPERLIPIDEMIA 11/16/2007  . ESOPHAGEAL STRICTURE 10/12/2007  . ARTHRITIS, RHEUMATOID 02/27/2007  . HYPOTHYROIDISM 01/19/2007  . HYPERTENSION 01/19/2007  . ALLERGIC RHINITIS 01/19/2007  . GERD 01/19/2007    Dollene Cleveland, PT, DPT July 24, 2014 4:44 PM Phone: [phone] Fax: [phone]

## 2014-07-15 ENCOUNTER — Ambulatory Visit: Payer: Medicare HMO

## 2014-07-15 DIAGNOSIS — M1990 Unspecified osteoarthritis, unspecified site: Secondary | ICD-10-CM | POA: Diagnosis not present

## 2014-07-15 DIAGNOSIS — M25552 Pain in left hip: Secondary | ICD-10-CM

## 2014-07-15 NOTE — Patient Instructions (Signed)
Quads / HF, Supine   Lie near edge of bed, one leg bent, foot flat on bed. Other leg hanging over edge, relaxed, thigh resting entirely on bed. Bend hanging knee backward keeping thigh in contact with bed. Hold _30__ seconds.  Repeat _3__ times per session. Do _3__ sessions per day.  Copyright  VHI. All rights reserved.   

## 2014-07-15 NOTE — Therapy (Signed)
Outpatient Rehabilitation [facility] [address] [place], [place], [address] Phone: [phone]   Fax:  [phone]  Physical Therapy Treatment  Patient Details  Name: [name] MRN: [id number] Date of Birth: 06/21/1946  Encounter Date: 07/15/2014      PT End of Session - 07/15/14 1108    Visit Number 2   Number of Visits 12   Date for PT Re-Evaluation 08/21/14   PT Start Time 1020   PT Stop Time 1105   PT Time Calculation (min) 45 min   Activity Tolerance Patient tolerated treatment well      Past Medical History  Diagnosis Date  . Allergy   . GERD (gastroesophageal reflux disease)   . Hypertension   . Thyroid disease   . Costochondritis   . S/P TKR (total knee replacement) 09  . S/P TAH (total abdominal hysterectomy)   . Total knee replacement status 09/2007    redo  . Diabetes mellitus without complication     type 2  . Shortness of breath     Hx: of with exertion  . Pneumonia     Hx: of  . Tingling     Hx: of in feet  . Arthritis     RA (Dr. Ouida Sills)  . Osteoarthritis   . Gastritis   . Colon polyps 2008    HYPERPLASTIC    Past Surgical History  Procedure Laterality Date  . Abdominal hysterectomy  1978  . Total knee arthroplasty Right   . Hnp    . Nephrectomy Right 2010    10.rcc cancer  . Back surgery      x 2  . Colonoscopy w/ biopsies and polypectomy      Hx: of  . Lumbar laminectomy/decompression microdiscectomy Left 02/12/2013    Procedure: LUMBAR TWO THREE, LUMBAR THREE FOUR, LUMBAR FOUR FIVE  LAMINECTOMY/DECOMPRESSION MICRODISCECTOMY 3 LEVELS;  Surgeon: Charlie Pitter, MD;  Location: [place] [facility];  Service: Neurosurgery;  Laterality: Left;  . Carpal tunnel release Left     There were no vitals taken for this visit.  Visit Diagnosis:  Hip pain, left      Subjective Assessment - 07/15/14 1024    Symptoms Pain rates 7/10 today and attributes it to the weather. Pt reports compliance with HEP 2 times a days.    Pertinent  History Back surgery x 3 , HTN, DM   How long can you sit comfortably? 30 mins   How long can you stand comfortably? 10 mins   Diagnostic tests L hip X ray showed arthritis   Currently in Pain? Yes   Pain Score 7    Pain Location Hip   Pain Orientation Left   Pain Descriptors / Indicators Aching;Sharp   Pain Type Chronic pain   Pain Frequency Intermittent            [facility] Adult PT Treatment/Exercise - 07/15/14 1028    Knee/Hip Exercises: Stretches   Active Hamstring Stretch 3 reps;30 seconds;Other (comment)   Active Hamstring Stretch Limitations Hip ADDuctor stretch  manually   Hip Flexor Stretch 3 reps;30 seconds   Knee/Hip Exercises: Supine   Heel Slides AROM;2 sets;10 reps   Other Supine Knee Exercises Clam stretch 30 sec x 3    Modalities   Modalities Moist Heat   Moist Heat Therapy   Number Minutes Moist Heat 10 Minutes   Moist Heat Location Other (comment)  L hip    Manual Therapy   Manual Therapy Joint mobilization;Massage  Joint Mobilization M>L joint mobs Grades I-III and inferior anterior mobs.    Massage STM to L quad and ITB hip flex and quad, during stretches           PT Education - 07/15/14 1107    Education provided Yes   Education Details Hip Flexor stretch    Person(s) Educated Patient   Methods Explanation;Demonstration;Tactile cues;Verbal cues;Handout   Comprehension Verbalized understanding;Returned demonstration;Verbal cues required;Need further instruction;Tactile cues required              Plan - 07/15/14 1108    Clinical Impression Statement Improved ease demonstrated with heel slide and less catches of pain with PROM movmeent. Pt reported decreased pain with joint mobs, distraction,  and pain decreased form 7/10 to 4/ 10 following treatment.    Pt will benefit from skilled therapeutic intervention in order to improve on the following deficits Abnormal gait;Decreased range of motion;Difficulty walking;Impaired flexibility;Decreased  strength;Decreased mobility;Pain;Decreased knowledge of use of DME;Decreased activity tolerance;Decreased endurance   Rehab Potential Good   PT Frequency 2x / week   PT Duration 6 weeks   PT Treatment/Interventions ADLs/Self Care Home Management;Electrical Stimulation;Cryotherapy;Moist Heat;Therapeutic activities;Therapeutic exercise;Patient/family education;Gait training;DME Instruction;Stair training;Neuromuscular re-education;Manual techniques;Dry needling   PT Next Visit Plan Stretches, manual therapy for joint mobs, gentle strengthening.    PT Home Exercise Plan Add hip ER stretch ( supine clam)    Consulted and Agree with Plan of Care Patient                               Problem List Patient Active Problem List   Diagnosis Date Noted  . Asthma with acute exacerbation 03/13/2014  . Diarrhea 08/31/2013  . Spinal stenosis, lumbar region, with neurogenic claudication 02/12/2013  . Diabetes 1.5, managed as type 2 03/05/2010  . RENAL CELL CANCER 01/14/2009  . PERSONAL HX COLONIC POLYPS 11/14/2008  . HYPERLIPIDEMIA 11/16/2007  . ESOPHAGEAL STRICTURE 10/12/2007  . ARTHRITIS, RHEUMATOID 02/27/2007  . HYPOTHYROIDISM 01/19/2007  . HYPERTENSION 01/19/2007  . ALLERGIC RHINITIS 01/19/2007  . GERD 01/19/2007   Dollene Cleveland, PT, DPT 07/15/2014 11:11 AM Phone: [phone] Fax: [phone]

## 2014-07-18 ENCOUNTER — Ambulatory Visit: Payer: Medicare HMO | Admitting: Physical Therapy

## 2014-07-18 DIAGNOSIS — M1612 Unilateral primary osteoarthritis, left hip: Secondary | ICD-10-CM

## 2014-07-18 DIAGNOSIS — M1990 Unspecified osteoarthritis, unspecified site: Secondary | ICD-10-CM | POA: Diagnosis not present

## 2014-07-18 DIAGNOSIS — M25552 Pain in left hip: Secondary | ICD-10-CM

## 2014-07-18 NOTE — Therapy (Signed)
Outpatient Rehabilitation [facility] [address] [place], [place], [address] Phone: [phone]   Fax:  [phone]  Physical Therapy Treatment  Patient Details  Name: [name] MRN: [id number] Date of Birth: 30-Aug-1945  Encounter Date: 07/18/2014      PT End of Session - 07/18/14 0930    Visit Number 3   Number of Visits 12   Date for PT Re-Evaluation 08/21/14   PT Start Time 0855   PT Stop Time 0935   PT Time Calculation (min) 40 min   Activity Tolerance Patient tolerated treatment well   Behavior During Therapy [facility] for tasks assessed/performed      Past Medical History  Diagnosis Date  . Allergy   . GERD (gastroesophageal reflux disease)   . Hypertension   . Thyroid disease   . Costochondritis   . S/P TKR (total knee replacement) 09  . S/P TAH (total abdominal hysterectomy)   . Total knee replacement status 09/2007    redo  . Diabetes mellitus without complication     type 2  . Shortness of breath     Hx: of with exertion  . Pneumonia     Hx: of  . Tingling     Hx: of in feet  . Arthritis     RA (Dr. Ouida Sills)  . Osteoarthritis   . Gastritis   . Colon polyps 2008    HYPERPLASTIC    Past Surgical History  Procedure Laterality Date  . Abdominal hysterectomy  1978  . Total knee arthroplasty Right   . Hnp    . Nephrectomy Right 2010    10.rcc cancer  . Back surgery      x 2  . Colonoscopy w/ biopsies and polypectomy      Hx: of  . Lumbar laminectomy/decompression microdiscectomy Left 02/12/2013    Procedure: LUMBAR TWO THREE, LUMBAR THREE FOUR, LUMBAR FOUR FIVE  LAMINECTOMY/DECOMPRESSION MICRODISCECTOMY 3 LEVELS;  Surgeon: Charlie Pitter, MD;  Location: [place] [facility];  Service: Neurosurgery;  Laterality: Left;  . Carpal tunnel release Left     There were no vitals taken for this visit.  Visit Diagnosis:  Hip pain, left  Arthritis of left hip      Subjective Assessment - 07/18/14 0858    Symptoms Feels "ok" today, "I think it's  the weather."   Pertinent History Back surgery x 3 , HTN, DM   Limitations Standing;Walking   How long can you stand comfortably? 5 min   How long can you walk comfortably? 10-15   Diagnostic tests L hip X ray showed arthritis   Currently in Pain? Yes   Pain Score 8    Pain Location Hip   Pain Orientation Left   Pain Descriptors / Indicators Aching;Sharp   Pain Type Chronic pain   Pain Onset More than a month ago   Pain Frequency Intermittent   Aggravating Factors  standing and walking            [facility] Adult PT Treatment/Exercise - 07/18/14 0901    Knee/Hip Exercises: Stretches   Passive Hamstring Stretch 3 reps;30 seconds   Knee/Hip Exercises: Aerobic   Stationary Bike NuStep Level 3 x 8 min, 4 extremities   Knee/Hip Exercises: Supine   Bridges Strengthening;Both;10 reps   Straight Leg Raises Strengthening;Left;10 reps   Other Supine Knee Exercises Clam stretch 30 sec x 3    Knee/Hip Exercises: Sidelying   Hip ABduction Strengthening;Left;10 reps   Clams x 10 reps LLE  Modalities   Modalities Moist Heat   Moist Heat Therapy   Number Minutes Moist Heat 10 Minutes   Moist Heat Location Other (comment)  L hip          PT Education - 07/18/14 0930    Education provided No          PT Short Term Goals - 07/18/14 0931    PT SHORT TERM GOAL #1   Title "Independent with initial HEP   Time 2   Period Weeks   Status On-going   PT SHORT TERM GOAL #2   Title "Report pain decrease at rest from  7 /10 to  3 /10.   Time 2   Period Weeks   Status On-going          PT Long Term Goals - 07/18/14 0931    PT LONG TERM GOAL #1   Title "Pain will decrease to 1/10 with all functional activities.   Time 6   Status On-going   PT LONG TERM GOAL #2   Title "L hip extension will improve to -10 for improved tolerance to standing and walking for 1 hour without increased pain in order to return to PLOF.   Time 6   Period Weeks   Status On-going   PT LONG TERM GOAL #3    Title LEFS will improve from  15/ 80  to  30/ 80   indicating improved functional mobility.   Time 6   Period Weeks   Status On-going   PT LONG TERM GOAL #4   Title L hip flexion and ABD strength will improve to 3+/5 for improved ability to transfer into and our of car with less difficulty and less pain.    Time 6   Period Weeks   Status On-going          Plan - 07/18/14 0930    Clinical Impression Statement Pt continues to demonstrate pain and attributes to weather.  Will continue to benefit from PT to maximize function   PT Next Visit Plan Stretches, manual therapy for joint mobs, gentle strengthening.    PT Home Exercise Plan Add hip ER stretch ( supine clam)                                Problem List Patient Active Problem List   Diagnosis Date Noted  . Asthma with acute exacerbation 03/13/2014  . Diarrhea 08/31/2013  . Spinal stenosis, lumbar region, with neurogenic claudication 02/12/2013  . Diabetes 1.5, managed as type 2 03/05/2010  . RENAL CELL CANCER 01/14/2009  . PERSONAL HX COLONIC POLYPS 11/14/2008  . HYPERLIPIDEMIA 11/16/2007  . ESOPHAGEAL STRICTURE 10/12/2007  . ARTHRITIS, RHEUMATOID 02/27/2007  . HYPOTHYROIDISM 01/19/2007  . HYPERTENSION 01/19/2007  . ALLERGIC RHINITIS 01/19/2007  . GERD 01/19/2007     Laureen Abrahams, PT, DPT 07/18/2014 9:41 AM   [facility] [address], [place] [address]  [phone] (office) [phone] (fax)

## 2014-07-22 ENCOUNTER — Telehealth: Payer: Self-pay | Admitting: *Deleted

## 2014-07-22 ENCOUNTER — Ambulatory Visit: Payer: Medicare HMO | Admitting: Physical Therapy

## 2014-07-22 DIAGNOSIS — M25552 Pain in left hip: Secondary | ICD-10-CM

## 2014-07-22 DIAGNOSIS — M1612 Unilateral primary osteoarthritis, left hip: Secondary | ICD-10-CM

## 2014-07-22 DIAGNOSIS — M1990 Unspecified osteoarthritis, unspecified site: Secondary | ICD-10-CM | POA: Diagnosis not present

## 2014-07-22 NOTE — Telephone Encounter (Signed)
sw pt informed her that her provider will be out of the office @ 11:45am. pt agreed to come in @ 10:15am...td

## 2014-07-22 NOTE — Therapy (Signed)
[place], [place], [address] Phone: [phone]   Fax:  [phone]  Physical Therapy Treatment  Patient Details  Name: [name] MRN: [id number] Date of Birth: 13-May-1946  Encounter Date: 07/22/2014      PT End of Session - 07/22/14 1240    Visit Number 4   Number of Visits 12   Date for PT Re-Evaluation 08/21/14   PT Start Time 1151   PT Stop Time 1233   PT Time Calculation (min) 42 min   Activity Tolerance Patient tolerated treatment well   Behavior During Therapy [facility] for tasks assessed/performed      Past Medical History  Diagnosis Date  . Allergy   . GERD (gastroesophageal reflux disease)   . Hypertension   . Thyroid disease   . Costochondritis   . S/P TKR (total knee replacement) 09  . S/P TAH (total abdominal hysterectomy)   . Total knee replacement status 09/2007    redo  . Diabetes mellitus without complication     type 2  . Shortness of breath     Hx: of with exertion  . Pneumonia     Hx: of  . Tingling     Hx: of in feet  . Arthritis     RA (Dr. Ouida Sills)  . Osteoarthritis   . Gastritis   . Colon polyps 2008    HYPERPLASTIC    Past Surgical History  Procedure Laterality Date  . Abdominal hysterectomy  1978  . Total knee arthroplasty Right   . Hnp    . Nephrectomy Right 2010    10.rcc cancer  . Back surgery      x 2  . Colonoscopy w/ biopsies and polypectomy      Hx: of  . Lumbar laminectomy/decompression microdiscectomy Left 02/12/2013    Procedure: LUMBAR TWO THREE, LUMBAR THREE FOUR, LUMBAR FOUR FIVE  LAMINECTOMY/DECOMPRESSION MICRODISCECTOMY 3 LEVELS;  Surgeon: Charlie Pitter, MD;  Location: [place] [facility];  Service: Neurosurgery;  Laterality: Left;  . Carpal tunnel release Left     There were no vitals taken for this visit.  Visit Diagnosis:  Hip pain, left  Arthritis of left hip      Subjective Assessment - 07/22/14 1154    Symptoms Sore thighs today   Pertinent History Back surgery x 3 , HTN, DM   Limitations Standing;Walking   How long can you sit comfortably? 30 mins   How long can you stand comfortably? 5 min   How long can you walk comfortably? 10-15 min   Diagnostic tests L hip X ray showed arthritis   Currently in Pain? Yes   Pain Score 8    Pain Location Hip   Pain Orientation Right;Left   Pain Descriptors / Indicators Burning   Pain Type Chronic pain   Pain Onset More than a month ago   Pain Frequency Intermittent   Aggravating Factors  standing and walking   Pain Relieving Factors sitting/resting                    [facility] Adult PT Treatment/Exercise - 07/22/14 1158    Knee/Hip Exercises: Stretches   Quad Stretch 3 reps;30 seconds  limited tolerance in prone; switched to sidelying   Hip Flexor Stretch 60 seconds;Other (comment);2 reps  prone lying for hip flexor stretch   Knee/Hip Exercises: Aerobic   Stationary Bike NuStep Level 4 x 8 min, 4 extremities   Knee/Hip Exercises: Sidelying  Clams x15 bil with yellow theraband; increased time needed with cues for technique   Modalities   Modalities Moist Heat   Moist Heat Therapy   Number Minutes Moist Heat 15 Minutes   Moist Heat Location Other (comment)  bil hip                PT Education - 07/22/14 1239    Education provided No          PT Short Term Goals - 07/22/14 1241    PT SHORT TERM GOAL #1   Title "Independent with initial HEP   Time 2   Period Weeks   Status On-going   PT SHORT TERM GOAL #2   Title "Report pain decrease at rest from  7 /10 to  3 /10.   Time 2   Period Weeks   Status On-going           PT Long Term Goals - 07/22/14 1241    PT LONG TERM GOAL #1   Title "Pain will decrease to 1/10 with all functional activities.   Time 6   Period Weeks   Status On-going   PT LONG TERM GOAL #2   Title "L hip extension will improve to -10 for improved tolerance to standing and walking for 1 hour without increased  pain in order to return to PLOF.   Time 6   Period Weeks   Status On-going   PT LONG TERM GOAL #3   Title LEFS will improve from  15/ 80  to  30/ 80   indicating improved functional mobility.   Time 6   Period Weeks   Status On-going   PT LONG TERM GOAL #4   Title L hip flexion and ABD strength will improve to 3+/5 for improved ability to transfer into and our of car with less difficulty and less pain.    Time 6   Period Weeks   Status On-going               Plan - 07/22/14 1240    Clinical Impression Statement Pt now with pain bil hips, left still worse than right.  Will continue to benefit from PT however limited tolerance to exercises.     PT Next Visit Plan Stretches, manual therapy for joint mobs, gentle strengthening.    Consulted and Agree with Plan of Care Patient        Problem List Patient Active Problem List   Diagnosis Date Noted  . Asthma with acute exacerbation 03/13/2014  . Diarrhea 08/31/2013  . Spinal stenosis, lumbar region, with neurogenic claudication 02/12/2013  . Diabetes 1.5, managed as type 2 03/05/2010  . RENAL CELL CANCER 01/14/2009  . PERSONAL HX COLONIC POLYPS 11/14/2008  . HYPERLIPIDEMIA 11/16/2007  . ESOPHAGEAL STRICTURE 10/12/2007  . ARTHRITIS, RHEUMATOID 02/27/2007  . HYPOTHYROIDISM 01/19/2007  . HYPERTENSION 01/19/2007  . ALLERGIC RHINITIS 01/19/2007  . GERD 01/19/2007   Laureen Abrahams, PT, DPT 07/22/2014 12:42 PM  [facility] [address] [place], [place], [address] Phone: [phone]   Fax:  [phone]

## 2014-07-23 ENCOUNTER — Ambulatory Visit: Payer: Medicare HMO | Admitting: Rehabilitation

## 2014-07-23 ENCOUNTER — Telehealth: Payer: Self-pay | Admitting: *Deleted

## 2014-07-23 DIAGNOSIS — M1612 Unilateral primary osteoarthritis, left hip: Secondary | ICD-10-CM

## 2014-07-23 DIAGNOSIS — M1990 Unspecified osteoarthritis, unspecified site: Secondary | ICD-10-CM | POA: Diagnosis not present

## 2014-07-23 DIAGNOSIS — M25552 Pain in left hip: Secondary | ICD-10-CM

## 2014-07-23 NOTE — Therapy (Signed)
[place], [place], [address] Phone: [phone]   Fax:  [phone]  Physical Therapy Treatment  Patient Details  Name: [name] MRN: [id number] Date of Birth: 04/13/1946  Encounter Date: 07/23/2014      PT End of Session - 07/23/14 1128    Visit Number 5   Number of Visits 12   Date for PT Re-Evaluation 08/21/14   PT Start Time 0263   PT Stop Time 1115   PT Time Calculation (min) 60 min      Past Medical History  Diagnosis Date  . Allergy   . GERD (gastroesophageal reflux disease)   . Hypertension   . Thyroid disease   . Costochondritis   . S/P TKR (total knee replacement) 09  . S/P TAH (total abdominal hysterectomy)   . Total knee replacement status 09/2007    redo  . Diabetes mellitus without complication     type 2  . Shortness of breath     Hx: of with exertion  . Pneumonia     Hx: of  . Tingling     Hx: of in feet  . Arthritis     RA (Dr. Ouida Sills)  . Osteoarthritis   . Gastritis   . Colon polyps 2008    HYPERPLASTIC    Past Surgical History  Procedure Laterality Date  . Abdominal hysterectomy  1978  . Total knee arthroplasty Right   . Hnp    . Nephrectomy Right 2010    10.rcc cancer  . Back surgery      x 2  . Colonoscopy w/ biopsies and polypectomy      Hx: of  . Lumbar laminectomy/decompression microdiscectomy Left 02/12/2013    Procedure: LUMBAR TWO THREE, LUMBAR THREE FOUR, LUMBAR FOUR FIVE  LAMINECTOMY/DECOMPRESSION MICRODISCECTOMY 3 LEVELS;  Surgeon: Charlie Pitter, MD;  Location: [place] [facility];  Service: Neurosurgery;  Laterality: Left;  . Carpal tunnel release Left     There were no vitals taken for this visit.  Visit Diagnosis:  Hip pain, left  Arthritis of left hip      Subjective Assessment - 07/23/14 1020    Symptoms I feel better today, yesterday I was hurting.    Pertinent History Back surgery x 3 , HTN, DM   Limitations Standing;Walking   Currently in Pain? Yes   Pain Score 5    Pain Location Hip   Pain Orientation Right;Left   Pain Descriptors / Indicators Aching   Pain Type Chronic pain   Pain Onset More than a month ago                    [facility] Adult PT Treatment/Exercise - 07/23/14 1022    Knee/Hip Exercises: Stretches   Hip Flexor Stretch 3 reps;30 seconds  standing lunge at edge of table   Piriformis Stretch 3 reps;30 seconds  seated   Knee/Hip Exercises: Aerobic   Stationary Bike NuStep Level 5 x 8 min, 4 extremities   Knee/Hip Exercises: Supine   Bridges 10 reps   Other Supine Knee Exercises butterfly stretch 3 x 30   Modalities   Modalities Moist Heat   Moist Heat Therapy   Number Minutes Moist Heat 15 Minutes   Moist Heat Location Other (comment)  bil hip   Manual Therapy   Manual Therapy Massage   Massage STW left quad, hip flexor, adductor  PT Education - 07/23/14 1128    Education provided Yes   Education Details HEP stretches   Person(s) Educated Patient   Methods Explanation;Handout   Comprehension Verbalized understanding                    Plan - 07/23/14 1129    Clinical Impression Statement Very tender and knotted hip flexor and adductor muscles left >right with good relief following massage and heat in clinic today.    PT Next Visit Plan cont/review stretches, continue soft tissue work to quads, hip flexors, adductors, gentle strength, HMP        Problem List Patient Active Problem List   Diagnosis Date Noted  . Asthma with acute exacerbation 03/13/2014  . Diarrhea 08/31/2013  . Spinal stenosis, lumbar region, with neurogenic claudication 02/12/2013  . Diabetes 1.5, managed as type 2 03/05/2010  . RENAL CELL CANCER 01/14/2009  . PERSONAL HX COLONIC POLYPS 11/14/2008  . HYPERLIPIDEMIA 11/16/2007  . ESOPHAGEAL STRICTURE 10/12/2007  . ARTHRITIS, RHEUMATOID 02/27/2007  . HYPOTHYROIDISM 01/19/2007  . HYPERTENSION 01/19/2007  .  ALLERGIC RHINITIS 01/19/2007  . GERD 01/19/2007    Dorene Ar, PTA 07/23/2014, 11:35 AM  [facility] [address] [place], [place], [address] Phone: [phone]   Fax:  [phone]

## 2014-07-23 NOTE — Patient Instructions (Signed)
  Piriformis Stretch, Sitting   Sit, one ankle on opposite knee, same-side hand on crossed knee. Push down on knee, keeping spine straight. Lean torso forward, with flat back, until tension is felt in hamstrings and gluteals of crossed-leg side. Hold _30__ seconds.  Repeat _3__ times per session. Do _2__ sessions per day.  Copyright  VHI. All rights reserved.  Butterfly, Supine   Lie on back, feet together. Lower knees toward floor. Hold __30_ seconds. Repeat __3_ times per session. Do _2__ sessions per day.  Copyright  VHI. All rights reserved.  Flexors, Kneeling  ONE KNEE ON CHAIR OR COUCH, HOLD TO CHAIR AND LEAN FORWARD Kneel on one leg. Slowly push pelvis down while slightly arching back until stretch is felt on front of hip. Hold __30_ seconds.  Repeat __2_ times per session. Do ___ sessions per day.  Copyright  VHI. All rights reserved.

## 2014-07-23 NOTE — Telephone Encounter (Signed)
appts made and printed...td 

## 2014-08-05 ENCOUNTER — Ambulatory Visit: Payer: Medicare HMO | Attending: Neurosurgery | Admitting: Physical Therapy

## 2014-08-05 DIAGNOSIS — M1990 Unspecified osteoarthritis, unspecified site: Secondary | ICD-10-CM | POA: Diagnosis not present

## 2014-08-05 DIAGNOSIS — M25552 Pain in left hip: Secondary | ICD-10-CM

## 2014-08-05 DIAGNOSIS — M1612 Unilateral primary osteoarthritis, left hip: Secondary | ICD-10-CM

## 2014-08-05 NOTE — Therapy (Signed)
[place], [place], [address] Phone: [phone]   Fax:  [phone]  Physical Therapy Treatment  Patient Details  Name: [name] MRN: [id number] Date of Birth: Oct 14, 1945  Encounter Date: 08/05/2014      PT End of Session - 08/05/14 1058    Visit Number 6   Number of Visits 12   Date for PT Re-Evaluation 08/21/14   PT Start Time 1025   PT Stop Time 1058   PT Time Calculation (min) 33 min   Activity Tolerance Patient tolerated treatment well   Behavior During Therapy [facility] for tasks assessed/performed      Past Medical History  Diagnosis Date  . Allergy   . GERD (gastroesophageal reflux disease)   . Hypertension   . Thyroid disease   . Costochondritis   . S/P TKR (total knee replacement) 09  . S/P TAH (total abdominal hysterectomy)   . Total knee replacement status 09/2007    redo  . Diabetes mellitus without complication     type 2  . Shortness of breath     Hx: of with exertion  . Pneumonia     Hx: of  . Tingling     Hx: of in feet  . Arthritis     RA (Dr. Ouida Sills)  . Osteoarthritis   . Gastritis   . Colon polyps 2008    HYPERPLASTIC    Past Surgical History  Procedure Laterality Date  . Abdominal hysterectomy  1978  . Total knee arthroplasty Right   . Hnp    . Nephrectomy Right 2010    10.rcc cancer  . Back surgery      x 2  . Colonoscopy w/ biopsies and polypectomy      Hx: of  . Lumbar laminectomy/decompression microdiscectomy Left 02/12/2013    Procedure: LUMBAR TWO THREE, LUMBAR THREE FOUR, LUMBAR FOUR FIVE  LAMINECTOMY/DECOMPRESSION MICRODISCECTOMY 3 LEVELS;  Surgeon: Charlie Pitter, MD;  Location: [place] [facility];  Service: Neurosurgery;  Laterality: Left;  . Carpal tunnel release Left     There were no vitals taken for this visit.  Visit Diagnosis:  Hip pain, left  Arthritis of left hip      Subjective Assessment - 08/05/14 1028    Symptoms Feels the same today,  doesn't feel like anything is really helping.  Feels like it is due to arthritis.   Pertinent History Back surgery x 3 , HTN, DM   Limitations Standing;Walking   How long can you sit comfortably? 30 mins   How long can you stand comfortably? 5 min   How long can you walk comfortably? 10-15 min   Diagnostic tests L hip X ray showed arthritis   Currently in Pain? Yes   Pain Score 7    Pain Location Hip  and thighs   Pain Orientation Right;Left   Pain Descriptors / Indicators Aching   Pain Type Chronic pain   Pain Onset More than a month ago   Pain Frequency Intermittent   Aggravating Factors  standing and walking   Pain Relieving Factors sitting/resting                    [facility] Adult PT Treatment/Exercise - 08/05/14 1030    Knee/Hip Exercises: Stretches   Quad Stretch 2 reps;30 seconds  sidelying with strap   Hip Flexor Stretch 2 reps;30 seconds   Piriformis Stretch 2 reps;30 seconds  seated   Knee/Hip Exercises: Aerobic  Stationary Bike NuStep Level 5 x 8 min, 4 extremities                PT Education - 08/05/14 1056    Education provided Yes   Education Details goals of care and POC; possible d/c next 1-2 weeks   Person(s) Educated Patient   Methods Explanation   Comprehension Verbalized understanding          PT Short Term Goals - 08/05/14 1147    PT SHORT TERM GOAL #1   Title "Independent with initial HEP   Time 2   Period Weeks   Status Achieved   PT SHORT TERM GOAL #2   Title "Report pain decrease at rest from  7 /10 to  3 /10.   Time 2   Period Weeks   Status On-going           PT Long Term Goals - 08/05/14 1147    PT LONG TERM GOAL #1   Title "Pain will decrease to 1/10 with all functional activities.   Time 6   Period Weeks   Status On-going   PT LONG TERM GOAL #2   Title "L hip extension will improve to -10 for improved tolerance to standing and walking for 1 hour without increased pain in order to return to PLOF.   Time  6   Period Weeks   Status On-going   PT LONG TERM GOAL #3   Title LEFS will improve from  15/ 80  to  30/ 80   indicating improved functional mobility.   Time 6   Period Weeks   Status On-going   PT LONG TERM GOAL #4   Title L hip flexion and ABD strength will improve to 3+/5 for improved ability to transfer into and our of car with less difficulty and less pain.    Time 6   Period Weeks   Status On-going               Plan - 08/05/14 1146    Clinical Impression Statement Pt continues to demonstrate little carryover from session to session.  Discussed possible d/c as limited progress has been made.  Will benefit from PT to transition to home program and improve function.   PT Next Visit Plan continue strengthening/stretching/manual as able, modalities as needed   Consulted and Agree with Plan of Care Patient        Problem List Patient Active Problem List   Diagnosis Date Noted  . Asthma with acute exacerbation 03/13/2014  . Diarrhea 08/31/2013  . Spinal stenosis, lumbar region, with neurogenic claudication 02/12/2013  . Diabetes 1.5, managed as type 2 03/05/2010  . RENAL CELL CANCER 01/14/2009  . PERSONAL HX COLONIC POLYPS 11/14/2008  . HYPERLIPIDEMIA 11/16/2007  . ESOPHAGEAL STRICTURE 10/12/2007  . ARTHRITIS, RHEUMATOID 02/27/2007  . HYPOTHYROIDISM 01/19/2007  . HYPERTENSION 01/19/2007  . ALLERGIC RHINITIS 01/19/2007  . GERD 01/19/2007    Laureen Abrahams, PT, DPT 08/05/2014 11:48 AM  [facility] [address] [place], [place], [address] Phone: [phone]   Fax:  [phone]

## 2014-08-07 ENCOUNTER — Ambulatory Visit: Payer: Medicare HMO | Admitting: Rehabilitation

## 2014-08-07 DIAGNOSIS — M1990 Unspecified osteoarthritis, unspecified site: Secondary | ICD-10-CM | POA: Diagnosis not present

## 2014-08-07 DIAGNOSIS — M1612 Unilateral primary osteoarthritis, left hip: Secondary | ICD-10-CM

## 2014-08-07 DIAGNOSIS — M25552 Pain in left hip: Secondary | ICD-10-CM

## 2014-08-07 NOTE — Patient Instructions (Signed)
Bridge   Lie back, legs bent. Inhale, pressing hips up. Keeping ribs in, lengthen lower back. Exhale, rolling down along spine from top. Repeat __10-20    Hip Extension (Prone)   Lift left leg _6___ inches from floor, keeping knee locked. Repeat __10__ times per set. Do _2___ sets per session. Do _2___ sessions per day.  http://orth.exer.us/98   Copyright  VHI. All rights reserved.  Hip Adduction: Leg Lift (Eccentric) - Side-Lying   Lie on side with top leg bent, foot flat behind lower leg. Quickly lift lower leg. Slowly lower for 3-5 seconds. _10__ reps per set, _2__ sets per day, 7__ days per week. Add ___ lbs when you achieve ___ repetitions.  Copyright  VHI. All rights reserved.  Abduction: Side Leg Lift (Eccentric) - Side-Lying   Lie on side. Lift top leg slightly higher than shoulder level. Keep top leg straight with body, toes pointing forward. Slowly lower for 3-5 seconds. __10_ reps per set, _2__ sets per day, _7__ days per week. Add ___ lbs when you achieve ___ repetitions.  Copyright  VHI. All rights reserved.  Strengthening: Straight Leg Raise (Phase 1)   Tighten muscles on front of right thigh, then lift leg __12__ inches from surface, keeping knee locked.  Repeat _10___ times per set. Do __2__ sets per session. Do __2__ sessions per day.  http://orth.exer.us/614   Copyright  VHI. All rights reserved.

## 2014-08-07 NOTE — Therapy (Signed)
[place], [place], [address] Phone: [phone]   Fax:  [phone]  Physical Therapy Treatment  Patient Details  Name: [name] MRN: [id number] Date of Birth: 1946/03/29  Encounter Date: 08/07/2014      PT End of Session - 08/07/14 0957    Visit Number 7   Number of Visits 12   Date for PT Re-Evaluation 08/21/14   PT Start Time 0932   PT Stop Time 1015   PT Time Calculation (min) 43 min      Past Medical History  Diagnosis Date  . Allergy   . GERD (gastroesophageal reflux disease)   . Hypertension   . Thyroid disease   . Costochondritis   . S/P TKR (total knee replacement) 09  . S/P TAH (total abdominal hysterectomy)   . Total knee replacement status 09/2007    redo  . Diabetes mellitus without complication     type 2  . Shortness of breath     Hx: of with exertion  . Pneumonia     Hx: of  . Tingling     Hx: of in feet  . Arthritis     RA (Dr. Ouida Sills)  . Osteoarthritis   . Gastritis   . Colon polyps 2008    HYPERPLASTIC    Past Surgical History  Procedure Laterality Date  . Abdominal hysterectomy  1978  . Total knee arthroplasty Right   . Hnp    . Nephrectomy Right 2010    10.rcc cancer  . Back surgery      x 2  . Colonoscopy w/ biopsies and polypectomy      Hx: of  . Lumbar laminectomy/decompression microdiscectomy Left 02/12/2013    Procedure: LUMBAR TWO THREE, LUMBAR THREE FOUR, LUMBAR FOUR FIVE  LAMINECTOMY/DECOMPRESSION MICRODISCECTOMY 3 LEVELS;  Surgeon: Charlie Pitter, MD;  Location: [place] [facility];  Service: Neurosurgery;  Laterality: Left;  . Carpal tunnel release Left     There were no vitals taken for this visit.  Visit Diagnosis:  Hip pain, left  Arthritis of left hip      Subjective Assessment - 08/07/14 0942    Symptoms My pain is not in my back it is in my hip and thigh   Pain Score 6    Pain Location --  hip and thigh left   Pain Descriptors /  Indicators Aching   Pain Type Chronic pain                    [facility] Adult PT Treatment/Exercise - 08/07/14 1026    Knee/Hip Exercises: Stretches   Hip Flexor Stretch 3 reps;30 seconds  standing lunge   Knee/Hip Exercises: Aerobic   Stationary Bike NuStep Level 5 x 8 min, 4 extremities   Knee/Hip Exercises: Supine   Bridges 2 sets;10 reps   Other Supine Knee Exercises Supine 4 way hip x 10 each  difficulty with adduction and extension   Other Supine Knee Exercises butterfly stretch 3 x 30, pmodified figure 4 3 x 30 sec                PT Education - 08/07/14 1024    Education provided Yes   Education Details 4 way hip/ bridge, continue stretches          PT Short Term Goals - 08/05/14 1147    PT SHORT TERM GOAL #1   Title "Independent with initial HEP   Time 2  Period Weeks   Status Achieved   PT SHORT TERM GOAL #2   Title "Report pain decrease at rest from  7 /10 to  3 /10.   Time 2   Period Weeks   Status On-going           PT Long Term Goals - 08/05/14 1147    PT LONG TERM GOAL #1   Title "Pain will decrease to 1/10 with all functional activities.   Time 6   Period Weeks   Status On-going   PT LONG TERM GOAL #2   Title "L hip extension will improve to -10 for improved tolerance to standing and walking for 1 hour without increased pain in order to return to PLOF.   Time 6   Period Weeks   Status On-going   PT LONG TERM GOAL #3   Title LEFS will improve from  15/ 80  to  30/ 80   indicating improved functional mobility.   Time 6   Period Weeks   Status On-going   PT LONG TERM GOAL #4   Title L hip flexion and ABD strength will improve to 3+/5 for improved ability to transfer into and our of car with less difficulty and less pain.    Time 6   Period Weeks   Status On-going               Plan - 08/07/14 7893    Clinical Impression Statement Encouraged patient to increase HEP compliance to maximize benefit of P.T. Pt will  have MRI of hips if P.T. unsuccessful.   PT Next Visit Plan continue strengthening/stretching/manual as able, modalities as needed        Problem List Patient Active Problem List   Diagnosis Date Noted  . Asthma with acute exacerbation 03/13/2014  . Diarrhea 08/31/2013  . Spinal stenosis, lumbar region, with neurogenic claudication 02/12/2013  . Diabetes 1.5, managed as type 2 03/05/2010  . RENAL CELL CANCER 01/14/2009  . PERSONAL HX COLONIC POLYPS 11/14/2008  . HYPERLIPIDEMIA 11/16/2007  . ESOPHAGEAL STRICTURE 10/12/2007  . ARTHRITIS, RHEUMATOID 02/27/2007  . HYPOTHYROIDISM 01/19/2007  . HYPERTENSION 01/19/2007  . ALLERGIC RHINITIS 01/19/2007  . GERD 01/19/2007    Dorene Ar, PTA 08/07/2014, 10:40 AM  [place], [place], [address] Phone: [phone]   Fax:  [phone]

## 2014-08-12 ENCOUNTER — Ambulatory Visit: Payer: Medicare HMO | Admitting: Rehabilitation

## 2014-08-12 DIAGNOSIS — M1990 Unspecified osteoarthritis, unspecified site: Secondary | ICD-10-CM | POA: Diagnosis not present

## 2014-08-12 DIAGNOSIS — M25552 Pain in left hip: Secondary | ICD-10-CM

## 2014-08-12 DIAGNOSIS — M1612 Unilateral primary osteoarthritis, left hip: Secondary | ICD-10-CM

## 2014-08-12 NOTE — Therapy (Signed)
[place], [place], [address] Phone: [phone]   Fax:  [phone]  Physical Therapy Treatment  Patient Details  Name: [name] MRN: [id number] Date of Birth: [birth year] Referring Provider:  Dorena Cookey, MD  Encounter Date: 08/12/2014      PT End of Session - 08/12/14 1044    Visit Number 8   Number of Visits 12   Date for PT Re-Evaluation 08/21/14   PT Start Time 1025   PT Stop Time 1125   PT Time Calculation (min) 60 min      Past Medical History  Diagnosis Date  . Allergy   . GERD (gastroesophageal reflux disease)   . Hypertension   . Thyroid disease   . Costochondritis   . S/P TKR (total knee replacement) 09  . S/P TAH (total abdominal hysterectomy)   . Total knee replacement status 09/2007    redo  . Diabetes mellitus without complication     type 2  . Shortness of breath     Hx: of with exertion  . Pneumonia     Hx: of  . Tingling     Hx: of in feet  . Arthritis     RA (Dr. Ouida Sills)  . Osteoarthritis   . Gastritis   . Colon polyps 2008    HYPERPLASTIC    Past Surgical History  Procedure Laterality Date  . Abdominal hysterectomy  1978  . Total knee arthroplasty Right   . Hnp    . Nephrectomy Right 2010    10.rcc cancer  . Back surgery      x 2  . Colonoscopy w/ biopsies and polypectomy      Hx: of  . Lumbar laminectomy/decompression microdiscectomy Left 02/12/2013    Procedure: LUMBAR TWO THREE, LUMBAR THREE FOUR, LUMBAR FOUR FIVE  LAMINECTOMY/DECOMPRESSION MICRODISCECTOMY 3 LEVELS;  Surgeon: Charlie Pitter, MD;  Location: [facility];  Service: Neurosurgery;  Laterality: Left;  . Carpal tunnel release Left     There were no vitals taken for this visit.  Visit Diagnosis:  No diagnosis found.      Subjective Assessment - 08/12/14 1039    Currently in Pain? Yes   Pain Score 8    Pain Location --  left hip, thigh, knee, NT in left foot   Pain Orientation  Left   Pain Descriptors / Indicators Aching;Throbbing   Aggravating Factors  standing, walking   Pain Relieving Factors sitting/resting          [facility] PT Assessment - 08/12/14 0001    AROM   Left Hip Extension -7   Left Hip Flexion 122   Left Hip External Rotation  57   Left Hip Internal Rotation  17                  [facility] Adult PT Treatment/Exercise - 08/12/14 1043    Knee/Hip Exercises: Stretches   Hip Flexor Stretch 3 reps;30 seconds  standing lunge and supine   Knee/Hip Exercises: Aerobic   Stationary Bike NuStep Level 5 x 10 min, 4 extremities   Knee/Hip Exercises: Supine   Bridges 2 sets;10 reps   Other Supine Knee Exercises figure 4 stretch within tolerance 3x30  difficulty with adduction and extension   Other Supine Knee Exercises SLR flexion and abducton 10 bil   Modalities   Modalities Moist Heat;Cryotherapy   Moist Heat Therapy   Number Minutes Moist Heat 15 Minutes   Moist Heat  Location Other (comment)  left anterior thigh                  PT Short Term Goals - 08/05/14 1147    PT SHORT TERM GOAL #1   Title "Independent with initial HEP   Time 2   Period Weeks   Status Achieved   PT SHORT TERM GOAL #2   Title "Report pain decrease at rest from  7 /10 to  3 /10.   Time 2   Period Weeks   Status On-going           PT Long Term Goals - 08/05/14 1147    PT LONG TERM GOAL #1   Title "Pain will decrease to 1/10 with all functional activities.   Time 6   Period Weeks   Status On-going   PT LONG TERM GOAL #2   Title "L hip extension will improve to -10 for improved tolerance to standing and walking for 1 hour without increased pain in order to return to PLOF.   Time 6   Period Weeks   Status On-going   PT LONG TERM GOAL #3   Title LEFS will improve from  15/ 80  to  30/ 80   indicating improved functional mobility.   Time 6   Period Weeks   Status On-going   PT LONG TERM GOAL #4   Title L hip flexion and ABD strength will  improve to 3+/5 for improved ability to transfer into and our of car with less difficulty and less pain.    Time 6   Period Weeks   Status On-going               Plan - 08/12/14 1115    Clinical Impression Statement ROM improved, pt still limited by pain    PT Next Visit Plan check hip strength, continue strengthening/stretching/manual as able, modalities as needed, did pt make appt with MD?        Problem List Patient Active Problem List   Diagnosis Date Noted  . Asthma with acute exacerbation 03/13/2014  . Diarrhea 08/31/2013  . Spinal stenosis, lumbar region, with neurogenic claudication 02/12/2013  . Diabetes 1.5, managed as type 2 03/05/2010  . RENAL CELL CANCER 01/14/2009  . PERSONAL HX COLONIC POLYPS 11/14/2008  . HYPERLIPIDEMIA 11/16/2007  . ESOPHAGEAL STRICTURE 10/12/2007  . ARTHRITIS, RHEUMATOID 02/27/2007  . HYPOTHYROIDISM 01/19/2007  . HYPERTENSION 01/19/2007  . ALLERGIC RHINITIS 01/19/2007  . GERD 01/19/2007    Dorene Ar, PTA 08/12/2014, 11:16 AM  [facility] [address] [place], [place], [address] Phone: [phone]   Fax:  [phone]

## 2014-08-14 ENCOUNTER — Ambulatory Visit: Payer: Medicare HMO | Admitting: Rehabilitation

## 2014-08-14 DIAGNOSIS — M1990 Unspecified osteoarthritis, unspecified site: Secondary | ICD-10-CM | POA: Diagnosis not present

## 2014-08-14 DIAGNOSIS — M25552 Pain in left hip: Secondary | ICD-10-CM

## 2014-08-14 DIAGNOSIS — M1612 Unilateral primary osteoarthritis, left hip: Secondary | ICD-10-CM

## 2014-08-14 NOTE — Therapy (Signed)
[place], [place], [address] Phone: [phone]   Fax:  [phone]  Physical Therapy Treatment  Patient Details  Name: [name] MRN: [id number] Date of Birth: 10/06/1945 Referring Provider:  Dorena Cookey, MD  Encounter Date: 08/14/2014      PT End of Session - 08/14/14 1100    Visit Number 9   Number of Visits 12   Date for PT Re-Evaluation 08/21/14   PT Start Time 1017   PT Stop Time 1100   PT Time Calculation (min) 43 min      Past Medical History  Diagnosis Date  . Allergy   . GERD (gastroesophageal reflux disease)   . Hypertension   . Thyroid disease   . Costochondritis   . S/P TKR (total knee replacement) 09  . S/P TAH (total abdominal hysterectomy)   . Total knee replacement status 09/2007    redo  . Diabetes mellitus without complication     type 2  . Shortness of breath     Hx: of with exertion  . Pneumonia     Hx: of  . Tingling     Hx: of in feet  . Arthritis     RA (Dr. Ouida Sills)  . Osteoarthritis   . Gastritis   . Colon polyps 2008    HYPERPLASTIC    Past Surgical History  Procedure Laterality Date  . Abdominal hysterectomy  1978  . Total knee arthroplasty Right   . Hnp    . Nephrectomy Right 2010    10.rcc cancer  . Back surgery      x 2  . Colonoscopy w/ biopsies and polypectomy      Hx: of  . Lumbar laminectomy/decompression microdiscectomy Left 02/12/2013    Procedure: LUMBAR TWO THREE, LUMBAR THREE FOUR, LUMBAR FOUR FIVE  LAMINECTOMY/DECOMPRESSION MICRODISCECTOMY 3 LEVELS;  Surgeon: Charlie Pitter, MD;  Location: [facility];  Service: Neurosurgery;  Laterality: Left;  . Carpal tunnel release Left     There were no vitals taken for this visit.  Visit Diagnosis:  Hip pain, left  Arthritis of left hip      Subjective Assessment - 08/14/14 1042    Symptoms Continued pain thighs and hips. knee is better today   Currently in Pain? Yes   Pain Score 6     Pain Location --  left > right hip thighs           [facility] PT Assessment - 08/14/14 0001    Strength   Right Hip Flexion 4/5   Right Hip ABduction 3/5   Left Hip Flexion 4-/5   Left Hip ABduction 3+/5                  [facility] Adult PT Treatment/Exercise - 08/14/14 0001    Knee/Hip Exercises: Stretches   Hip Flexor Stretch 3 reps;30 seconds  standing lunge    Knee/Hip Exercises: Aerobic   Stationary Bike NuStep Level 5 x 8 min, 4 extremities   Knee/Hip Exercises: Supine   Bridges 2 sets;10 reps  ball between knee   Knee Flexion Both;10 reps;2 sets  alternating heel slides with core contract   Other Supine Knee Exercises supine ball squeeze 10x2, clam with red band 10 x2   Knee/Hip Exercises: Sidelying   Hip ABduction 2 sets;10 reps  bilat   Clams 2 sets, 10 reps bilat  PT Short Term Goals - 08/05/14 1147    PT SHORT TERM GOAL #1   Title "Independent with initial HEP   Time 2   Period Weeks   Status Achieved   PT SHORT TERM GOAL #2   Title "Report pain decrease at rest from  7 /10 to  3 /10.   Time 2   Period Weeks   Status On-going           PT Long Term Goals - 08/05/14 1147    PT LONG TERM GOAL #1   Title "Pain will decrease to 1/10 with all functional activities.   Time 6   Period Weeks   Status On-going   PT LONG TERM GOAL #2   Title "L hip extension will improve to -10 for improved tolerance to standing and walking for 1 hour without increased pain in order to return to PLOF.   Time 6   Period Weeks   Status On-going   PT LONG TERM GOAL #3   Title LEFS will improve from  15/ 80  to  30/ 80   indicating improved functional mobility.   Time 6   Period Weeks   Status On-going   PT LONG TERM GOAL #4   Title L hip flexion and ABD strength will improve to 3+/5 for improved ability to transfer into and our of car with less difficulty and less pain.    Time 6   Period Weeks   Status On-going                Plan - 08/14/14 1101    Clinical Impression Statement minimal improvement in pain, stength and ROM improved   PT Next Visit Plan G CODE and FOTO (if needed) NEXT VISIT, continue strengthening/stretching/manual as able, modalities as needed, did pt make appt with MD?        Problem List Patient Active Problem List   Diagnosis Date Noted  . Asthma with acute exacerbation 03/13/2014  . Diarrhea 08/31/2013  . Spinal stenosis, lumbar region, with neurogenic claudication 02/12/2013  . Diabetes 1.5, managed as type 2 03/05/2010  . RENAL CELL CANCER 01/14/2009  . PERSONAL HX COLONIC POLYPS 11/14/2008  . HYPERLIPIDEMIA 11/16/2007  . ESOPHAGEAL STRICTURE 10/12/2007  . ARTHRITIS, RHEUMATOID 02/27/2007  . HYPOTHYROIDISM 01/19/2007  . HYPERTENSION 01/19/2007  . ALLERGIC RHINITIS 01/19/2007  . GERD 01/19/2007    Dorene Ar, PTA 08/14/2014, 11:02 AM  [facility] [address] [place], [place], [address] Phone: [phone]   Fax:  [phone]

## 2014-08-19 ENCOUNTER — Ambulatory Visit: Payer: Medicare HMO | Admitting: Rehabilitation

## 2014-08-19 DIAGNOSIS — M1990 Unspecified osteoarthritis, unspecified site: Secondary | ICD-10-CM | POA: Diagnosis not present

## 2014-08-19 DIAGNOSIS — M1612 Unilateral primary osteoarthritis, left hip: Secondary | ICD-10-CM

## 2014-08-19 DIAGNOSIS — M25552 Pain in left hip: Secondary | ICD-10-CM

## 2014-08-19 NOTE — Therapy (Addendum)
[place], [place], [address] Phone: [phone]   Fax:  [phone]  Physical Therapy Treatment  Patient Details  Name: DESARAE PLACIDE MRN: [id number] Date of Birth: 02/05/1946 Referring Provider:  Dorena Cookey, MD  Encounter Date: 08/19/2014      PT End of Session - 08/19/14 1102    Visit Number 10   Date for PT Re-Evaluation 08/21/14   PT Start Time 1017   PT Stop Time 1115   PT Time Calculation (min) 58 min      Past Medical History  Diagnosis Date  . Allergy   . GERD (gastroesophageal reflux disease)   . Hypertension   . Thyroid disease   . Costochondritis   . S/P TKR (total knee replacement) 09  . S/P TAH (total abdominal hysterectomy)   . Total knee replacement status 09/2007    redo  . Diabetes mellitus without complication     type 2  . Shortness of breath     Hx: of with exertion  . Pneumonia     Hx: of  . Tingling     Hx: of in feet  . Arthritis     RA (Dr. Ouida Sills)  . Osteoarthritis   . Gastritis   . Colon polyps 2008    HYPERPLASTIC    Past Surgical History  Procedure Laterality Date  . Abdominal hysterectomy  1978  . Total knee arthroplasty Right   . Hnp    . Nephrectomy Right 2010    10.rcc cancer  . Back surgery      x 2  . Colonoscopy w/ biopsies and polypectomy      Hx: of  . Lumbar laminectomy/decompression microdiscectomy Left 02/12/2013    Procedure: LUMBAR TWO THREE, LUMBAR THREE FOUR, LUMBAR FOUR FIVE  LAMINECTOMY/DECOMPRESSION MICRODISCECTOMY 3 LEVELS;  Surgeon: Charlie Pitter, MD;  Location: [facility];  Service: Neurosurgery;  Laterality: Left;  . Carpal tunnel release Left     There were no vitals taken for this visit.  Visit Diagnosis:  Hip pain, left  Arthritis of left hip      Subjective Assessment - 08/19/14 1025    Symptoms I have an appt with MD Tuesday. 6/10 pain Left thigh/hip   Currently in Pain? Yes   Pain Score 6    Pain Location  Hip  and thigh   Pain Orientation Left   Pain Descriptors / Indicators Aching;Throbbing   Pain Type Chronic pain   Pain Radiating Towards toward left knee   Pain Frequency Constant   Aggravating Factors  standing/ walking   Pain Relieving Factors sitting/rest          [facility] PT Assessment - 08/19/14 1042    Observation/Other Assessments   Focus on Therapeutic Outcomes (FOTO)  60% limited (initial intake due to incomplete on eval   Lower Extremity Functional Scale  22/80  72.5 % limited   AROM   Right Hip Extension 0   Right Hip Flexion 120   Right Hip External Rotation  45   Left Hip Extension -5  improved from -23 on initial eval)   Left Hip Flexion 120  (improved from 90 on initial eval)   Left Hip External Rotation  57   Left Hip Internal Rotation  17   Strength   Right Hip Flexion 4/5   Right Hip ABduction 3+/5  improved from 2+/5 on initial EVAL   Left Hip Flexion 4-/5  improved from 2+/5  Left Hip ABduction 3+/5  improved from 3/5                  [facility] Adult PT Treatment/Exercise - 08/19/14 0001    Knee/Hip Exercises: Stretches   Hip Flexor Stretch 3 reps;30 seconds  standing lunge , also leg hang at edge of table   Knee/Hip Exercises: Aerobic   Stationary Bike NuStep Level 5 x 8 min, 4 extremities   Modalities   Modalities Moist Heat   Moist Heat Therapy   Number Minutes Moist Heat 15 Minutes   Moist Heat Location --  bilateral anterior thighs                  PT Short Term Goals - 08/05/14 1147    PT SHORT TERM GOAL #1   Title "Independent with initial HEP   Time 2   Period Weeks   Status Achieved   PT SHORT TERM GOAL #2   Title "Report pain decrease at rest from  7 /10 to  3 /10.   Time 2   Period Weeks   Status On-going           PT Long Term Goals - 08/19/14 1100    PT LONG TERM GOAL #1   Title "Pain will decrease to 1/10 with all functional activities.   Time 6   Period Weeks   Status On-going   PT LONG TERM  GOAL #2   Title "L hip extension will improve to -10 for improved tolerance to standing and walking for 1 hour without increased pain in order to return to PLOF.   Time 6   Period Weeks   Status Achieved   PT LONG TERM GOAL #3   Title LEFS will improve from  15/ 80  to  30/ 80   indicating improved functional mobility.   Time 6   Period Weeks   Status On-going  Improved to 22/80   PT LONG TERM GOAL #4   Title L hip flexion and ABD strength will improve to 3+/5 for improved ability to transfer into and our of car with less difficulty and less pain.    Time 6   Period Weeks   Status Achieved               Plan - 08/19/14 1148    Clinical Impression Statement Lower extremity functional scale somewhat improved, pt reports pain levels continue at an average of 6/10 left > right hip. Hip extension ROM and bilateral hip strength have improved since initial eval. LTG #2 & 4 achieved.    PT Next Visit Plan Pt would like to Hold next two visits and will call us after her M.D. appt on 08/27/14, will need renewal if she wants to continue P.T. Possible DC        Problem List Patient Active Problem List   Diagnosis Date Noted  . Asthma with acute exacerbation 03/13/2014  . Diarrhea 08/31/2013  . Spinal stenosis, lumbar region, with neurogenic claudication 02/12/2013  . Diabetes 1.5, managed as type 2 03/05/2010  . RENAL CELL CANCER 01/14/2009  . PERSONAL HX COLONIC POLYPS 11/14/2008  . HYPERLIPIDEMIA 11/16/2007  . ESOPHAGEAL STRICTURE 10/12/2007  . ARTHRITIS, RHEUMATOID 02/27/2007  . HYPOTHYROIDISM 01/19/2007  . HYPERTENSION 01/19/2007  . ALLERGIC RHINITIS 01/19/2007  . GERD 01/19/2007    Dorene Ar, PTA 08/19/2014, 11:53 AM  [place], [place], [address] Phone: [phone]   Fax:  [phone]   Voncille Lo, PT 08/19/2014   PHYSICAL THERAPY DISCHARGE SUMMARY  Visits from Start  of Care: 10  Current functional level related to goals / functional outcomes: See above; 2 out of 4 LTGs met    Remaining deficits: Pain and decreased strength; pt now scheduled for surgery 10/15/14.   Education / Equipment: HEP  Plan: Patient agrees to discharge.  Patient goals were partially met. Patient is being discharged due to a change in medical status.  ?????    Discharge G codes: Functional Assessment Tool Used: LEFS Functional Limitation: Mobility Goal Status: CJ Current Status: CL  Laureen Abrahams, PT, DPT 10/03/2014 4:50 PM    [facility] [address] [place] [address]  [phone] (office) [phone] (fax)

## 2014-08-21 ENCOUNTER — Encounter: Payer: Medicare HMO | Admitting: Rehabilitation

## 2014-08-28 ENCOUNTER — Ambulatory Visit: Payer: Medicare HMO | Admitting: Rehabilitation

## 2014-09-03 ENCOUNTER — Ambulatory Visit (INDEPENDENT_AMBULATORY_CARE_PROVIDER_SITE_OTHER): Payer: Medicare HMO | Admitting: Family Medicine

## 2014-09-03 ENCOUNTER — Encounter: Payer: Self-pay | Admitting: Family Medicine

## 2014-09-03 VITALS — BP 132/74 | Temp 97.8°F | Wt 198.0 lb

## 2014-09-03 DIAGNOSIS — M4806 Spinal stenosis, lumbar region: Secondary | ICD-10-CM

## 2014-09-03 DIAGNOSIS — M48062 Spinal stenosis, lumbar region with neurogenic claudication: Secondary | ICD-10-CM

## 2014-09-03 MED ORDER — CYCLOBENZAPRINE HCL 5 MG PO TABS: 5.0000 mg | ORAL_TABLET | Freq: Three times a day (TID) | ORAL | Status: DC | PRN

## 2014-09-03 NOTE — Progress Notes (Signed)
   Subjective:    Patient ID: [name], female    DOB: 09-05-1945, 69 y.o.   MRN: [id number]  HPI [name] is a 69 year old female who comes in today with a 24-hour history of right-sided chest pain and flank pain  She states she woke up on Monday morning with soreness in the right side. She describes as sharp constant an 8 on a scale of 1-10. It hurts if she moves or takes a deep breath. She's otherwise been well.  She's been working with her rheumatologist and Dr. Trenton Gammon because she's had left hip and back pain. She underwent 6 weeks of therapy prior to any imaging.   Review of Systems Review of systems otherwise negative    Objective:   Physical Exam Well-developed well-nourished female no acute distress vital signs stable she's afebrile examination spine was normal there is tenderness in the lateral right lateral chest wall and flank muscles to palpation       Assessment & Plan:  Muscle spasm........... treat symptomatically

## 2014-09-03 NOTE — Patient Instructions (Signed)
Flexeril 5 mg........[name] Kitchen 1 tablet at bedtime when necessary  Heating pad  Low heat  Cover the heating pad with a towel so you don't burn his skin  Follow-up with year rheumatologist and neurosurgeon when necessary

## 2014-09-03 NOTE — Progress Notes (Signed)
Pre visit review using our clinic review tool, if applicable. No additional management support is needed unless otherwise documented below in the visit note. Lab Results  Component Value Date   HGBA1C 5.6 06/13/2014   HGBA1C 6.1 12/12/2013   HGBA1C 6.0 12/11/2012   Lab Results  Component Value Date   MICROALBUR 31.1* 06/13/2014   LDLCALC 92 12/11/2013   CREATININE 1.3* 06/13/2014

## 2014-09-10 ENCOUNTER — Other Ambulatory Visit: Payer: Self-pay | Admitting: Neurosurgery

## 2014-09-10 DIAGNOSIS — M48061 Spinal stenosis, lumbar region without neurogenic claudication: Secondary | ICD-10-CM

## 2014-09-25 ENCOUNTER — Ambulatory Visit
Admission: RE | Admit: 2014-09-25 | Discharge: 2014-09-25 | Disposition: A | Discharge status: Home / Self Care | Payer: Medicare HMO | Source: Ambulatory Visit | Attending: Neurosurgery | Admitting: Neurosurgery

## 2014-09-25 DIAGNOSIS — M48061 Spinal stenosis, lumbar region without neurogenic claudication: Secondary | ICD-10-CM

## 2014-09-25 MED ORDER — GADOBENATE DIMEGLUMINE 529 MG/ML IV SOLN
18.0000 mL | Freq: Once | INTRAVENOUS | Status: AC | PRN
  Administered 2014-09-25: 18 mL via INTRAVENOUS

## 2014-10-02 ENCOUNTER — Other Ambulatory Visit: Payer: Self-pay | Admitting: Neurosurgery

## 2014-10-03 ENCOUNTER — Telehealth: Payer: Self-pay | Admitting: *Deleted

## 2014-10-03 NOTE — Telephone Encounter (Signed)
Please give the pt a call...td

## 2014-10-08 NOTE — Pre-Procedure Instructions (Signed)
[name]  10/08/2014   Your procedure is scheduled on:  10/15/14  Report to [facility] Admitting at 825 AM.  Call this number if you have problems the morning of surgery: [phone]   Remember:   Do not eat food or drink liquids after midnight.   Take these medicines the morning of surgery with A SIP OF WATER: hydrocodone,synthroid,protonix   Do not wear jewelry, make-up or nail polish.  Do not wear lotions, powders, or perfumes. You may wear deodorant.  Do not shave 48 hours prior to surgery. Men may shave face and neck.  Do not bring valuables to the hospital.  [facility] is not responsible                  for any belongings or valuables.               Contacts, dentures or bridgework may not be worn into surgery.  Leave suitcase in the car. After surgery it may be brought to your room.  For patients admitted to the hospital, discharge time is determined by your                treatment team.               Patients discharged the day of surgery will not be allowed to drive  home.  Name and phone number of your driver: family  Special Instructions: Shower using CHG 2 nights before surgery and the night before surgery.  If you shower the day of surgery use CHG.  Use special wash - you have one bottle of CHG for all showers.  You should use approximately 1/3 of the bottle for each shower.   Please read over the following fact sheets that you were given: Pain Booklet, Coughing and Deep Breathing, Blood Transfusion Information, MRSA Information and Surgical Site Infection Prevention

## 2014-10-09 ENCOUNTER — Encounter (HOSPITAL_COMMUNITY): Payer: Self-pay

## 2014-10-09 ENCOUNTER — Other Ambulatory Visit (HOSPITAL_COMMUNITY): Payer: Medicare HMO

## 2014-10-09 ENCOUNTER — Encounter (HOSPITAL_COMMUNITY)
Admission: RE | Admit: 2014-10-09 | Discharge: 2014-10-09 | Disposition: A | Discharge status: Home / Self Care | Payer: Medicare HMO | Source: Ambulatory Visit | Attending: Neurosurgery | Admitting: Neurosurgery

## 2014-10-09 DIAGNOSIS — M4310 Spondylolisthesis, site unspecified: Secondary | ICD-10-CM | POA: Diagnosis not present

## 2014-10-09 DIAGNOSIS — Z01818 Encounter for other preprocedural examination: Secondary | ICD-10-CM | POA: Diagnosis not present

## 2014-10-09 LAB — CBC
HCT: 38.9 % (ref 36.0–46.0)
Hemoglobin: 13.5 g/dL (ref 12.0–15.0)
MCH: 31 pg (ref 26.0–34.0)
MCHC: 34.7 g/dL (ref 30.0–36.0)
MCV: 89.4 fL (ref 78.0–100.0)
Platelets: 195 10*3/uL (ref 150–400)
RBC: 4.35 MIL/uL (ref 3.87–5.11)
RDW: 14.9 % (ref 11.5–15.5)
WBC: 5.8 10*3/uL (ref 4.0–10.5)

## 2014-10-09 LAB — BASIC METABOLIC PANEL
Anion gap: 9 (ref 5–15)
BUN: 14 mg/dL (ref 6–23)
CO2: 24 mmol/L (ref 19–32)
Calcium: 9.5 mg/dL (ref 8.4–10.5)
Chloride: 105 mmol/L (ref 96–112)
Creatinine, Ser: 1.14 mg/dL — ABNORMAL HIGH (ref 0.50–1.10)
GFR calc Af Amer: 55 mL/min — ABNORMAL LOW (ref >90)
GFR calc non Af Amer: 48 mL/min — ABNORMAL LOW (ref >90)
Glucose, Bld: 109 mg/dL — ABNORMAL HIGH (ref 70–99)
Potassium: 4 mmol/L (ref 3.5–5.1)
Sodium: 138 mmol/L (ref 135–145)

## 2014-10-09 LAB — SURGICAL PCR SCREEN
MRSA, PCR: NEGATIVE
Staphylococcus aureus: NEGATIVE

## 2014-10-14 MED ORDER — CEFAZOLIN SODIUM-DEXTROSE 2-3 GM-% IV SOLR
2.0000 g | INTRAVENOUS | Status: AC
  Administered 2014-10-15: 2 g via INTRAVENOUS
  Filled 2014-10-14: qty 50

## 2014-10-14 NOTE — Progress Notes (Signed)
Pt made aware to arrive at 7:45 AM tomorrow.

## 2014-10-15 ENCOUNTER — Inpatient Hospital Stay (HOSPITAL_COMMUNITY): Payer: Medicare HMO | Admitting: Anesthesiology

## 2014-10-15 ENCOUNTER — Inpatient Hospital Stay (HOSPITAL_COMMUNITY)
Admission: RE | Admit: 2014-10-15 | Discharge: 2014-10-29 | DRG: 460 | Disposition: A | Discharge status: Home / Self Care | Payer: Medicare HMO | Source: Ambulatory Visit | Attending: Neurosurgery | Admitting: Neurosurgery

## 2014-10-15 ENCOUNTER — Encounter (HOSPITAL_COMMUNITY): Payer: Self-pay | Admitting: *Deleted

## 2014-10-15 ENCOUNTER — Inpatient Hospital Stay (HOSPITAL_COMMUNITY): Payer: Medicare HMO

## 2014-10-15 ENCOUNTER — Encounter (HOSPITAL_COMMUNITY)
Admission: RE | Disposition: A | Discharge status: Home / Self Care | Payer: Medicare HMO | Source: Ambulatory Visit | Attending: Neurosurgery

## 2014-10-15 DIAGNOSIS — I10 Essential (primary) hypertension: Secondary | ICD-10-CM

## 2014-10-15 DIAGNOSIS — K219 Gastro-esophageal reflux disease without esophagitis: Secondary | ICD-10-CM | POA: Diagnosis present

## 2014-10-15 DIAGNOSIS — M5126 Other intervertebral disc displacement, lumbar region: Secondary | ICD-10-CM | POA: Diagnosis present

## 2014-10-15 DIAGNOSIS — Z79899 Other long term (current) drug therapy: Secondary | ICD-10-CM | POA: Diagnosis not present

## 2014-10-15 DIAGNOSIS — M4310 Spondylolisthesis, site unspecified: Secondary | ICD-10-CM | POA: Diagnosis not present

## 2014-10-15 DIAGNOSIS — M069 Rheumatoid arthritis, unspecified: Secondary | ICD-10-CM | POA: Diagnosis present

## 2014-10-15 DIAGNOSIS — M1990 Unspecified osteoarthritis, unspecified site: Secondary | ICD-10-CM | POA: Diagnosis present

## 2014-10-15 DIAGNOSIS — R9431 Abnormal electrocardiogram [ECG] [EKG]: Secondary | ICD-10-CM | POA: Clinically undetermined

## 2014-10-15 DIAGNOSIS — A047 Enterocolitis due to Clostridium difficile: Secondary | ICD-10-CM | POA: Diagnosis present

## 2014-10-15 DIAGNOSIS — E860 Dehydration: Secondary | ICD-10-CM | POA: Diagnosis not present

## 2014-10-15 DIAGNOSIS — E119 Type 2 diabetes mellitus without complications: Secondary | ICD-10-CM | POA: Diagnosis present

## 2014-10-15 DIAGNOSIS — E139 Other specified diabetes mellitus without complications: Secondary | ICD-10-CM | POA: Diagnosis not present

## 2014-10-15 DIAGNOSIS — I129 Hypertensive chronic kidney disease with stage 1 through stage 4 chronic kidney disease, or unspecified chronic kidney disease: Secondary | ICD-10-CM | POA: Diagnosis present

## 2014-10-15 DIAGNOSIS — R079 Chest pain, unspecified: Secondary | ICD-10-CM | POA: Diagnosis not present

## 2014-10-15 DIAGNOSIS — E039 Hypothyroidism, unspecified: Secondary | ICD-10-CM | POA: Diagnosis present

## 2014-10-15 DIAGNOSIS — K567 Ileus, unspecified: Secondary | ICD-10-CM | POA: Diagnosis not present

## 2014-10-15 DIAGNOSIS — Z85528 Personal history of other malignant neoplasm of kidney: Secondary | ICD-10-CM

## 2014-10-15 DIAGNOSIS — N179 Acute kidney failure, unspecified: Secondary | ICD-10-CM | POA: Diagnosis not present

## 2014-10-15 DIAGNOSIS — R05 Cough: Secondary | ICD-10-CM

## 2014-10-15 DIAGNOSIS — Z905 Acquired absence of kidney: Secondary | ICD-10-CM | POA: Diagnosis present

## 2014-10-15 DIAGNOSIS — J45909 Unspecified asthma, uncomplicated: Secondary | ICD-10-CM | POA: Diagnosis present

## 2014-10-15 DIAGNOSIS — Z96651 Presence of right artificial knee joint: Secondary | ICD-10-CM | POA: Diagnosis present

## 2014-10-15 DIAGNOSIS — R0789 Other chest pain: Secondary | ICD-10-CM | POA: Diagnosis not present

## 2014-10-15 DIAGNOSIS — F17210 Nicotine dependence, cigarettes, uncomplicated: Secondary | ICD-10-CM | POA: Diagnosis present

## 2014-10-15 DIAGNOSIS — M4316 Spondylolisthesis, lumbar region: Secondary | ICD-10-CM | POA: Diagnosis present

## 2014-10-15 DIAGNOSIS — E876 Hypokalemia: Secondary | ICD-10-CM | POA: Diagnosis present

## 2014-10-15 DIAGNOSIS — K529 Noninfective gastroenteritis and colitis, unspecified: Secondary | ICD-10-CM

## 2014-10-15 DIAGNOSIS — Z981 Arthrodesis status: Secondary | ICD-10-CM | POA: Diagnosis not present

## 2014-10-15 DIAGNOSIS — N183 Chronic kidney disease, stage 3 (moderate): Secondary | ICD-10-CM | POA: Diagnosis present

## 2014-10-15 DIAGNOSIS — R059 Cough, unspecified: Secondary | ICD-10-CM

## 2014-10-15 DIAGNOSIS — D72829 Elevated white blood cell count, unspecified: Secondary | ICD-10-CM | POA: Diagnosis not present

## 2014-10-15 DIAGNOSIS — M549 Dorsalgia, unspecified: Secondary | ICD-10-CM | POA: Diagnosis present

## 2014-10-15 DIAGNOSIS — M4326 Fusion of spine, lumbar region: Secondary | ICD-10-CM

## 2014-10-15 DIAGNOSIS — A0472 Enterocolitis due to Clostridium difficile, not specified as recurrent: Secondary | ICD-10-CM | POA: Diagnosis not present

## 2014-10-15 DIAGNOSIS — I119 Hypertensive heart disease without heart failure: Secondary | ICD-10-CM | POA: Diagnosis present

## 2014-10-15 LAB — GLUCOSE, CAPILLARY
Glucose-Capillary: 104 mg/dL — ABNORMAL HIGH (ref 70–99)
Glucose-Capillary: 93 mg/dL (ref 70–99)
Glucose-Capillary: 95 mg/dL (ref 70–99)
Glucose-Capillary: 103 mg/dL — ABNORMAL HIGH (ref 70–99)

## 2014-10-15 SURGERY — POSTERIOR LUMBAR FUSION 1 LEVEL
Anesthesia: General | Site: Back

## 2014-10-15 MED ORDER — VECURONIUM BROMIDE 10 MG IV SOLR
INTRAVENOUS | Status: AC
  Filled 2014-10-15: qty 10

## 2014-10-15 MED ORDER — ONDANSETRON HCL 4 MG/2ML IJ SOLN
4.0000 mg | INTRAMUSCULAR | Status: DC | PRN
  Administered 2014-10-18 – 2014-10-23 (×4): 4 mg via INTRAVENOUS
  Filled 2014-10-15 (×4): qty 2

## 2014-10-15 MED ORDER — LACTATED RINGERS IV SOLN
INTRAVENOUS | Status: DC
  Administered 2014-10-15: 08:00:00 via INTRAVENOUS

## 2014-10-15 MED ORDER — ROCURONIUM BROMIDE 50 MG/5ML IV SOLN
INTRAVENOUS | Status: AC
  Filled 2014-10-15: qty 1

## 2014-10-15 MED ORDER — ARTIFICIAL TEARS OP OINT
TOPICAL_OINTMENT | OPHTHALMIC | Status: AC
  Filled 2014-10-15: qty 3.5

## 2014-10-15 MED ORDER — METFORMIN HCL 500 MG PO TABS
250.0000 mg | ORAL_TABLET | Freq: Every day | ORAL | Status: DC
  Administered 2014-10-16 – 2014-10-17 (×2): 250 mg via ORAL
  Administered 2014-10-18: 12:00:00 via ORAL
  Administered 2014-10-19 – 2014-10-21 (×3): 250 mg via ORAL
  Filled 2014-10-15 (×6): qty 1

## 2014-10-15 MED ORDER — 0.9 % SODIUM CHLORIDE (POUR BTL) OPTIME
TOPICAL | Status: DC | PRN
  Administered 2014-10-15: 1000 mL

## 2014-10-15 MED ORDER — LACTATED RINGERS IV SOLN
INTRAVENOUS | Status: DC | PRN
  Administered 2014-10-15 (×2): via INTRAVENOUS

## 2014-10-15 MED ORDER — SODIUM CHLORIDE 0.9 % IR SOLN
Status: DC | PRN
  Administered 2014-10-15: 500 mL

## 2014-10-15 MED ORDER — SODIUM CHLORIDE 0.9 % IJ SOLN
3.0000 mL | Freq: Two times a day (BID) | INTRAMUSCULAR | Status: DC
  Administered 2014-10-16 – 2014-10-26 (×12): 3 mL via INTRAVENOUS
  Administered 2014-10-27: 10 mL via INTRAVENOUS
  Administered 2014-10-27: 3 mL via INTRAVENOUS
  Administered 2014-10-28: 10 mL via INTRAVENOUS
  Administered 2014-10-28 – 2014-10-29 (×2): 3 mL via INTRAVENOUS

## 2014-10-15 MED ORDER — HYDROXYCHLOROQUINE SULFATE 200 MG PO TABS
400.0000 mg | ORAL_TABLET | Freq: Every day | ORAL | Status: DC
  Administered 2014-10-15 – 2014-10-23 (×9): 400 mg via ORAL
  Filled 2014-10-15 (×9): qty 2

## 2014-10-15 MED ORDER — STERILE WATER FOR INJECTION IJ SOLN
INTRAMUSCULAR | Status: AC
  Filled 2014-10-15: qty 10

## 2014-10-15 MED ORDER — SODIUM CHLORIDE 0.9 % IJ SOLN: 3.0000 mL | INTRAMUSCULAR | Status: DC | PRN

## 2014-10-15 MED ORDER — VECURONIUM BROMIDE 10 MG IV SOLR
INTRAVENOUS | Status: DC | PRN
  Administered 2014-10-15: 1 mg via INTRAVENOUS

## 2014-10-15 MED ORDER — MENTHOL 3 MG MT LOZG: 1.0000 | LOZENGE | OROMUCOSAL | Status: DC | PRN

## 2014-10-15 MED ORDER — ACETAMINOPHEN 325 MG PO TABS
650.0000 mg | ORAL_TABLET | ORAL | Status: DC | PRN
  Administered 2014-10-18 – 2014-10-23 (×7): 650 mg via ORAL
  Filled 2014-10-15 (×7): qty 2

## 2014-10-15 MED ORDER — PHENYLEPHRINE HCL 10 MG/ML IJ SOLN
10.0000 mg | INTRAVENOUS | Status: DC | PRN
  Administered 2014-10-15: 25 ug/min via INTRAVENOUS

## 2014-10-15 MED ORDER — ACETAMINOPHEN 650 MG RE SUPP: 650.0000 mg | RECTAL | Status: DC | PRN

## 2014-10-15 MED ORDER — MIDAZOLAM HCL 2 MG/2ML IJ SOLN
INTRAMUSCULAR | Status: AC
  Filled 2014-10-15: qty 2

## 2014-10-15 MED ORDER — FENTANYL CITRATE 0.05 MG/ML IJ SOLN
INTRAMUSCULAR | Status: DC | PRN
  Administered 2014-10-15: 50 ug via INTRAVENOUS
  Administered 2014-10-15: 150 ug via INTRAVENOUS
  Administered 2014-10-15: 100 ug via INTRAVENOUS
  Administered 2014-10-15: 50 ug via INTRAVENOUS

## 2014-10-15 MED ORDER — ONDANSETRON HCL 4 MG/2ML IJ SOLN
INTRAMUSCULAR | Status: AC
  Filled 2014-10-15: qty 2

## 2014-10-15 MED ORDER — LEFLUNOMIDE 20 MG PO TABS
20.0000 mg | ORAL_TABLET | Freq: Every day | ORAL | Status: DC
  Administered 2014-10-15 – 2014-10-23 (×9): 20 mg via ORAL
  Filled 2014-10-15 (×12): qty 1

## 2014-10-15 MED ORDER — LOSARTAN POTASSIUM 50 MG PO TABS
100.0000 mg | ORAL_TABLET | Freq: Every day | ORAL | Status: DC
  Administered 2014-10-15 – 2014-10-23 (×7): 100 mg via ORAL
  Filled 2014-10-15 (×7): qty 2

## 2014-10-15 MED ORDER — PROMETHAZINE HCL 25 MG/ML IJ SOLN: 6.2500 mg | INTRAMUSCULAR | Status: DC | PRN

## 2014-10-15 MED ORDER — FENTANYL CITRATE 0.05 MG/ML IJ SOLN
INTRAMUSCULAR | Status: AC
  Filled 2014-10-15: qty 5

## 2014-10-15 MED ORDER — HYDROMORPHONE HCL 1 MG/ML IJ SOLN
0.2500 mg | INTRAMUSCULAR | Status: DC | PRN
  Administered 2014-10-15 (×2): 0.5 mg via INTRAVENOUS

## 2014-10-15 MED ORDER — NEOSTIGMINE METHYLSULFATE 10 MG/10ML IV SOLN
INTRAVENOUS | Status: AC
  Filled 2014-10-15: qty 1

## 2014-10-15 MED ORDER — BISACODYL 10 MG RE SUPP: 10.0000 mg | Freq: Every day | RECTAL | Status: DC | PRN

## 2014-10-15 MED ORDER — PHENYLEPHRINE 40 MCG/ML (10ML) SYRINGE FOR IV PUSH (FOR BLOOD PRESSURE SUPPORT)
PREFILLED_SYRINGE | INTRAVENOUS | Status: AC
  Filled 2014-10-15: qty 10

## 2014-10-15 MED ORDER — VANCOMYCIN HCL 1000 MG IV SOLR
INTRAVENOUS | Status: AC
  Filled 2014-10-15: qty 1000

## 2014-10-15 MED ORDER — GLYCOPYRROLATE 0.2 MG/ML IJ SOLN
INTRAMUSCULAR | Status: DC | PRN
  Administered 2014-10-15: .7 mg via INTRAVENOUS

## 2014-10-15 MED ORDER — HYDROMORPHONE HCL 1 MG/ML IJ SOLN: 0.5000 mg | INTRAMUSCULAR | Status: DC | PRN

## 2014-10-15 MED ORDER — NEOSTIGMINE METHYLSULFATE 10 MG/10ML IV SOLN
INTRAVENOUS | Status: DC | PRN
  Administered 2014-10-15: 4 mg via INTRAVENOUS

## 2014-10-15 MED ORDER — PHENYLEPHRINE HCL 10 MG/ML IJ SOLN
INTRAMUSCULAR | Status: DC | PRN
  Administered 2014-10-15: 80 ug via INTRAVENOUS

## 2014-10-15 MED ORDER — ALBUMIN HUMAN 5 % IV SOLN
INTRAVENOUS | Status: DC | PRN
  Administered 2014-10-15: 11:00:00 via INTRAVENOUS

## 2014-10-15 MED ORDER — HYDROCHLOROTHIAZIDE 12.5 MG PO CAPS
12.5000 mg | ORAL_CAPSULE | Freq: Every day | ORAL | Status: DC
  Administered 2014-10-15 – 2014-10-23 (×7): 12.5 mg via ORAL
  Filled 2014-10-15 (×7): qty 1

## 2014-10-15 MED ORDER — THROMBIN 20000 UNITS EX SOLR
CUTANEOUS | Status: DC | PRN
  Administered 2014-10-15: 20 mL via TOPICAL

## 2014-10-15 MED ORDER — BUPIVACAINE HCL (PF) 0.25 % IJ SOLN
INTRAMUSCULAR | Status: DC | PRN
  Administered 2014-10-15: 20 mL

## 2014-10-15 MED ORDER — EPHEDRINE SULFATE 50 MG/ML IJ SOLN
INTRAMUSCULAR | Status: AC
  Filled 2014-10-15: qty 1

## 2014-10-15 MED ORDER — POLYETHYLENE GLYCOL 3350 17 G PO PACK
17.0000 g | PACK | Freq: Every day | ORAL | Status: DC | PRN
Start: 2014-10-15 — End: 2014-10-24
  Administered 2014-10-18: 17 g via ORAL
  Filled 2014-10-15: qty 1

## 2014-10-15 MED ORDER — ROCURONIUM BROMIDE 100 MG/10ML IV SOLN
INTRAVENOUS | Status: DC | PRN
  Administered 2014-10-15: 40 mg via INTRAVENOUS
  Administered 2014-10-15: 10 mg via INTRAVENOUS

## 2014-10-15 MED ORDER — HYDROCODONE-ACETAMINOPHEN 5-325 MG PO TABS: 1.0000 | ORAL_TABLET | ORAL | Status: DC | PRN

## 2014-10-15 MED ORDER — LEVOTHYROXINE SODIUM 50 MCG PO TABS
50.0000 ug | ORAL_TABLET | Freq: Every day | ORAL | Status: DC
  Administered 2014-10-16 – 2014-10-29 (×14): 50 ug via ORAL
  Filled 2014-10-15 (×14): qty 1

## 2014-10-15 MED ORDER — ATORVASTATIN CALCIUM 10 MG PO TABS
20.0000 mg | ORAL_TABLET | Freq: Every day | ORAL | Status: DC
  Administered 2014-10-15 – 2014-10-28 (×14): 20 mg via ORAL
  Filled 2014-10-15 (×15): qty 2

## 2014-10-15 MED ORDER — CEFAZOLIN SODIUM 1-5 GM-% IV SOLN
1.0000 g | Freq: Three times a day (TID) | INTRAVENOUS | Status: AC
  Administered 2014-10-15 – 2014-10-16 (×2): 1 g via INTRAVENOUS
  Filled 2014-10-15 (×4): qty 50

## 2014-10-15 MED ORDER — PNEUMOCOCCAL VAC POLYVALENT 25 MCG/0.5ML IJ INJ
0.5000 mL | INJECTION | INTRAMUSCULAR | Status: AC
  Administered 2014-10-16: 0.5 mL via INTRAMUSCULAR
  Filled 2014-10-15: qty 0.5

## 2014-10-15 MED ORDER — LIDOCAINE HCL (CARDIAC) 20 MG/ML IV SOLN
INTRAVENOUS | Status: DC | PRN
  Administered 2014-10-15: 75 mg via INTRAVENOUS

## 2014-10-15 MED ORDER — LOSARTAN POTASSIUM-HCTZ 100-12.5 MG PO TABS: 1.0000 | ORAL_TABLET | Freq: Every day | ORAL | Status: DC

## 2014-10-15 MED ORDER — MIDAZOLAM HCL 5 MG/5ML IJ SOLN
INTRAMUSCULAR | Status: DC | PRN
  Administered 2014-10-15: 0.5 mg via INTRAVENOUS

## 2014-10-15 MED ORDER — LIDOCAINE HCL (CARDIAC) 20 MG/ML IV SOLN
INTRAVENOUS | Status: AC
  Filled 2014-10-15: qty 5

## 2014-10-15 MED ORDER — PANTOPRAZOLE SODIUM 40 MG PO TBEC
40.0000 mg | DELAYED_RELEASE_TABLET | Freq: Every day | ORAL | Status: DC
  Administered 2014-10-16 – 2014-10-21 (×6): 40 mg via ORAL
  Filled 2014-10-15 (×6): qty 1

## 2014-10-15 MED ORDER — ONDANSETRON HCL 4 MG/2ML IJ SOLN
INTRAMUSCULAR | Status: DC | PRN
  Administered 2014-10-15: 4 mg via INTRAVENOUS

## 2014-10-15 MED ORDER — PROPOFOL 10 MG/ML IV BOLUS
INTRAVENOUS | Status: DC | PRN
  Administered 2014-10-15: 160 mg via INTRAVENOUS

## 2014-10-15 MED ORDER — HYDROMORPHONE HCL 1 MG/ML IJ SOLN
INTRAMUSCULAR | Status: AC
  Filled 2014-10-15: qty 1

## 2014-10-15 MED ORDER — FLEET ENEMA 7-19 GM/118ML RE ENEM: 1.0000 | ENEMA | Freq: Once | RECTAL | Status: AC | PRN

## 2014-10-15 MED ORDER — DIAZEPAM 5 MG PO TABS
5.0000 mg | ORAL_TABLET | Freq: Four times a day (QID) | ORAL | Status: DC | PRN
  Administered 2014-10-15 – 2014-10-16 (×3): 5 mg via ORAL
  Administered 2014-10-17 – 2014-10-19 (×2): 10 mg via ORAL
  Administered 2014-10-20: 5 mg via ORAL
  Filled 2014-10-15 (×2): qty 1
  Filled 2014-10-15 (×2): qty 2
  Filled 2014-10-15: qty 1

## 2014-10-15 MED ORDER — GLYCOPYRROLATE 0.2 MG/ML IJ SOLN
INTRAMUSCULAR | Status: AC
  Filled 2014-10-15: qty 3

## 2014-10-15 MED ORDER — SODIUM CHLORIDE 0.9 % IV SOLN
250.0000 mL | INTRAVENOUS | Status: DC
  Administered 2014-10-16: 250 mL via INTRAVENOUS

## 2014-10-15 MED ORDER — FLUTICASONE PROPIONATE 50 MCG/ACT NA SUSP
2.0000 | Freq: Every day | NASAL | Status: DC
  Administered 2014-10-15 – 2014-10-29 (×15): 2 via NASAL
  Filled 2014-10-15: qty 16

## 2014-10-15 MED ORDER — PHENOL 1.4 % MT LIQD: 1.0000 | OROMUCOSAL | Status: DC | PRN

## 2014-10-15 MED ORDER — SODIUM CHLORIDE 0.9 % IJ SOLN
INTRAMUSCULAR | Status: AC
  Filled 2014-10-15: qty 10

## 2014-10-15 MED ORDER — DOCUSATE SODIUM 100 MG PO CAPS
100.0000 mg | ORAL_CAPSULE | Freq: Two times a day (BID) | ORAL | Status: DC
  Administered 2014-10-15 – 2014-10-23 (×14): 100 mg via ORAL
  Filled 2014-10-15 (×15): qty 1

## 2014-10-15 MED ORDER — OXYCODONE-ACETAMINOPHEN 5-325 MG PO TABS
1.0000 | ORAL_TABLET | ORAL | Status: DC | PRN
  Administered 2014-10-15 – 2014-10-17 (×10): 2 via ORAL
  Administered 2014-10-17: 1 via ORAL
  Administered 2014-10-18 – 2014-10-19 (×4): 2 via ORAL
  Administered 2014-10-19 – 2014-10-20 (×2): 1 via ORAL
  Administered 2014-10-24 – 2014-10-25 (×2): 2 via ORAL
  Filled 2014-10-15 (×6): qty 2
  Filled 2014-10-15: qty 1
  Filled 2014-10-15 (×4): qty 2
  Filled 2014-10-15 (×2): qty 1
  Filled 2014-10-15 (×6): qty 2

## 2014-10-15 MED ORDER — DIAZEPAM 5 MG PO TABS
ORAL_TABLET | ORAL | Status: AC
  Filled 2014-10-15: qty 1

## 2014-10-15 MED ORDER — LEFLUNOMIDE 10 MG PO TABS: 20.0000 mg | ORAL_TABLET | Freq: Every day | ORAL | Status: DC

## 2014-10-15 MED ORDER — PROPOFOL 10 MG/ML IV BOLUS
INTRAVENOUS | Status: AC
  Filled 2014-10-15: qty 20

## 2014-10-15 MED ORDER — VANCOMYCIN HCL 1000 MG IV SOLR
INTRAVENOUS | Status: DC | PRN
  Administered 2014-10-15: 1000 mg

## 2014-10-15 MED FILL — Sodium Chloride IV Soln 0.9%: INTRAVENOUS | Qty: 1000 | Status: AC

## 2014-10-15 MED FILL — Heparin Sodium (Porcine) Inj 1000 Unit/ML: INTRAMUSCULAR | Qty: 30 | Status: AC

## 2014-10-15 SURGICAL SUPPLY — 61 items
BAG DECANTER FOR FLEXI CONT (MISCELLANEOUS) ×2 IMPLANT
BENZOIN TINCTURE PRP APPL 2/3 (GAUZE/BANDAGES/DRESSINGS) ×2 IMPLANT
BLADE CLIPPER SURG (BLADE) IMPLANT
BRUSH SCRUB EZ PLAIN DRY (MISCELLANEOUS) ×2 IMPLANT
BUR MATCHSTICK NEURO 3.0 LAGG (BURR) ×2 IMPLANT
CAGE CAPSTONE 10X22X6 (Cage) ×4 IMPLANT
CANISTER SUCT 3000ML PPV (MISCELLANEOUS) ×2 IMPLANT
CAP LCK SPNE (Orthopedic Implant) ×4 IMPLANT
CAP LOCK SPINE RADIUS (Orthopedic Implant) ×4 IMPLANT
CAP LOCKING (Orthopedic Implant) ×4 IMPLANT
CONT SPEC 4OZ CLIKSEAL STRL BL (MISCELLANEOUS) ×4 IMPLANT
COVER BACK TABLE 60X90IN (DRAPES) ×2 IMPLANT
DECANTER SPIKE VIAL GLASS SM (MISCELLANEOUS) ×2 IMPLANT
DERMABOND ADHESIVE PROPEN (GAUZE/BANDAGES/DRESSINGS) ×1
DERMABOND ADVANCED .7 DNX6 (GAUZE/BANDAGES/DRESSINGS) ×1 IMPLANT
DRAPE C-ARM 42X72 X-RAY (DRAPES) ×4 IMPLANT
DRAPE LAPAROTOMY 100X72X124 (DRAPES) ×2 IMPLANT
DRAPE POUCH INSTRU U-SHP 10X18 (DRAPES) ×2 IMPLANT
DRAPE PROXIMA HALF (DRAPES) IMPLANT
DRAPE SURG 17X23 STRL (DRAPES) ×8 IMPLANT
DRSG OPSITE POSTOP 4X6 (GAUZE/BANDAGES/DRESSINGS) ×2 IMPLANT
DURAPREP 26ML APPLICATOR (WOUND CARE) ×2 IMPLANT
ELECT REM PT RETURN 9FT ADLT (ELECTROSURGICAL) ×2
ELECTRODE REM PT RTRN 9FT ADLT (ELECTROSURGICAL) ×1 IMPLANT
EVACUATOR 1/8 PVC DRAIN (DRAIN) ×2 IMPLANT
GAUZE SPONGE 4X4 12PLY STRL (GAUZE/BANDAGES/DRESSINGS) ×2 IMPLANT
GAUZE SPONGE 4X4 16PLY XRAY LF (GAUZE/BANDAGES/DRESSINGS) IMPLANT
GLOVE BIOGEL PI IND STRL 7.5 (GLOVE) ×1 IMPLANT
GLOVE BIOGEL PI INDICATOR 7.5 (GLOVE) ×1
GLOVE ECLIPSE 6.5 STRL STRAW (GLOVE) ×2 IMPLANT
GLOVE ECLIPSE 9.0 STRL (GLOVE) ×4 IMPLANT
GLOVE EXAM NITRILE LRG STRL (GLOVE) IMPLANT
GLOVE EXAM NITRILE MD LF STRL (GLOVE) IMPLANT
GLOVE EXAM NITRILE XL STR (GLOVE) IMPLANT
GLOVE EXAM NITRILE XS STR PU (GLOVE) IMPLANT
GOWN STRL REUS W/ TWL LRG LVL3 (GOWN DISPOSABLE) ×1 IMPLANT
GOWN STRL REUS W/ TWL XL LVL3 (GOWN DISPOSABLE) ×2 IMPLANT
GOWN STRL REUS W/TWL 2XL LVL3 (GOWN DISPOSABLE) IMPLANT
GOWN STRL REUS W/TWL LRG LVL3 (GOWN DISPOSABLE) ×1
GOWN STRL REUS W/TWL XL LVL3 (GOWN DISPOSABLE) ×2
KIT BASIN OR (CUSTOM PROCEDURE TRAY) ×2 IMPLANT
KIT ROOM TURNOVER OR (KITS) ×2 IMPLANT
LIQUID BAND (GAUZE/BANDAGES/DRESSINGS) ×2 IMPLANT
NEEDLE HYPO 22GX1.5 SAFETY (NEEDLE) ×2 IMPLANT
NS IRRIG 1000ML POUR BTL (IV SOLUTION) ×2 IMPLANT
PACK LAMINECTOMY NEURO (CUSTOM PROCEDURE TRAY) ×2 IMPLANT
ROD RADIUS 45MM (Rod) ×2 IMPLANT
ROD SPNL 45X5.5XNS TI RDS (Rod) ×2 IMPLANT
SCREW 5.75X45MM (Screw) ×8 IMPLANT
SPONGE SURGIFOAM ABS GEL 100 (HEMOSTASIS) ×2 IMPLANT
STRIP CLOSURE SKIN 1/2X4 (GAUZE/BANDAGES/DRESSINGS) ×4 IMPLANT
SUT VIC AB 0 CT1 18XCR BRD8 (SUTURE) ×2 IMPLANT
SUT VIC AB 0 CT1 8-18 (SUTURE) ×2
SUT VIC AB 2-0 CT1 18 (SUTURE) ×2 IMPLANT
SUT VIC AB 3-0 SH 8-18 (SUTURE) ×4 IMPLANT
SYR 20ML ECCENTRIC (SYRINGE) ×2 IMPLANT
TAPE STRIPS DRAPE STRL (GAUZE/BANDAGES/DRESSINGS) ×2 IMPLANT
TOWEL OR 17X24 6PK STRL BLUE (TOWEL DISPOSABLE) ×2 IMPLANT
TOWEL OR 17X26 10 PK STRL BLUE (TOWEL DISPOSABLE) ×2 IMPLANT
TRAY FOLEY CATH 14FRSI W/METER (CATHETERS) ×2 IMPLANT
WATER STERILE IRR 1000ML POUR (IV SOLUTION) ×2 IMPLANT

## 2014-10-15 NOTE — Anesthesia Procedure Notes (Addendum)
Date/Time: 10/15/2014 10:27 AM Performed by: Eligha Bridegroom   Procedure Name: Intubation Date/Time: 10/15/2014 10:27 AM Performed by: Eligha Bridegroom Pre-anesthesia Checklist: Patient identified, Timeout performed, Emergency Drugs available, Suction available and Patient being monitored Patient Re-evaluated:Patient Re-evaluated prior to inductionOxygen Delivery Method: Circle system utilized Preoxygenation: Pre-oxygenation with 100% oxygen Intubation Type: IV induction Ventilation: Mask ventilation without difficulty and Oral airway inserted - appropriate to patient size Laryngoscope Size: Mac and 3 Grade View: Grade I Tube type: Oral Tube size: 7.0 mm Number of attempts: 1 Airway Equipment and Method: Stylet Placement Confirmation: ETT inserted through vocal cords under direct vision,  breath sounds checked- equal and bilateral and positive ETCO2 Secured at: 21 cm Dental Injury: Teeth and Oropharynx as per pre-operative assessment

## 2014-10-15 NOTE — Op Note (Signed)
Date of procedure: 10/15/2014   Date of dictation: Same  Service: Neurosurgery  Preoperative diagnosis: L4-L5 grade 1 unstable postlaminectomy spondylolisthesis with left L4-5 herniated nucleus pulposus  Postoperative diagnosis: Same  Procedure Name: Reexploration of L4-5 laminectomy with redo bilateral complete decompressive laminectomy and radical foraminotomies both L4 and L5 nerve roots, would be required for simple interbody fusion alone.  L4-5 posterior lumbar interbody fusion utilizing interbody peek cages and locally harvested autograft  L4-5 posterior lateral arthrodesis utilizing nonsegmental pedicle screw fixation and local autograft  Surgeon:Akbar Sacra A.Lucretia Pendley, M.D.  Asst. Surgeon: Kathyrn Sheriff  Anesthesia: General  Indication: 69 year old female status post previous L2-L5 decompressive laminectomy presents now with severe back and left lower extremity pain and weakness. Workup demonstrates evidence of new onset unstable spondylolisthesis at L4-5 with a large left-sided L4-5 disc herniation with a superior migrated fragment. Patient presents now for L4-5 decompression and fusion in hopes of improving her symptoms.  Operative note: After induction of anesthesia, patient positioned prone on the Wilson frame and appropriate padded. Lumbar region prepped and draped sterilely. Incision made overlying L4-5. Subperiosteal dissection performed exposing the lateral facets and transverse processes of L4 and L5. Deep self-retaining Placed intraoperative fluoroscopy was used levels were confirmed. Epidural scar was resected. Dissection was then made laterally and access to the spinal canal was obtained. Decompressive foraminotomies and completion of the laminectomy of L4 and L5 were then completed using Kerrison rongeurs and high-speed drill to remove the entire inferior facet of L4 bilaterally. The superior facet of L5 bilaterally and the superior aspect the L5 lamina. Ligament flavum and epidural  scar were elevated and resected. Bilateral discectomies at an performed at L4-5 including all elements of the superiorly migrated fragment area disc space then distracted at 10 mm. With a 10 mm distractor left the patient's right side thecal sac and roots were protected on the left side. Disc spaces and reamed and cut with 10 mm bone scrapers. Various chisels were used to clean the disc space. Soft tissues reamed interspace. A 10 mm x 22 mm 60 cage with locally harvested autograft packed inside was then impacted into place and rotated into final position. Distractor was removed from patient's right side. Thecal sac and her perspective the right side. Skin the disc space was prepared. Morcellized autograft was packed into the interspace. Second cage was impacted into place and rotated into final position. Pedicles of L4 and L5 were identified using surface landmarks and intraoperative fluoroscopy are 2 pressure borderline pedicle was removed in a high-speed drill. Pedicles and probe using a pedicle awl each pedicle awl track was then probed and found to be solidly within the bone. Screw caps were then used along the all tracts. These were once again confirmed to be within the bone. 5.75 x 45 mm radius Brand screws from [employer] were then placed bilaterally at L4 and L5. Transverse processes were then decorticated he has a high-speed drill. Morcellized autograft was packed posterolaterally for later fusion. Short segment tight in rods posterior the screw heads. Locking caps in place of his history has been walking Engaged with Construct under Compression. Final Images Revealed Good Position of THE Hardware at the Proper Level with Normal Alignment the Spine. Wound Was Irrigated One Final Time. Gelfoam Was Placed for Hemostasis. Vancomycin Powder Was Left in the Deep [employer]. Was Then Irrigated One Final Time and Then Closed in a Typical Fashion. Steri-Strips and Sterile Dressings Were Applied. There Were  No Apparent Complications. Patient Tolerated the Procedure Well  and She Returns to the Recovery Room Postoperatively

## 2014-10-15 NOTE — Progress Notes (Signed)
Orthopedic Tech Progress Note Patient Details:  [name] 1946-06-02 [id number] Brace order completed by bio-tech vendor. Patient ID: [name], female   DOB: 14-Jul-1946, 69 y.o.   MRN: [id number]   Braulio Bosch 10/15/2014, 4:27 PM

## 2014-10-15 NOTE — Progress Notes (Signed)
Pt has antibody noted in blood and blood bank will get 2 units ready due to this Dr Annette Stable called and informed. States that 2 units will be fine. Blood bank called and informed.

## 2014-10-15 NOTE — H&P (Signed)
[name] is an 69 y.o. female.   Chief Complaint: Back and left leg pain HPI: 69 year old female status post previous L2-L5 decompressive laminectomy presents with severe back and left lower extremity pain. Patient is gone MRI scanning which demonstrates evidence of a progressive grade 1 degenerative/post laminectomy spondylolisthesis at L4-5 with a large associated a left-sided disc herniation with superior migration causing marked compression of the thecal sac and left-sided nerve roots. Patient presents now for L4-5 decompression infusion in hopes of improving her symptoms.  Past Medical History  Diagnosis Date  . Allergy   . GERD (gastroesophageal reflux disease)   . Hypertension   . Thyroid disease   . Costochondritis   . S/P TKR (total knee replacement) 09  . S/P TAH (total abdominal hysterectomy)   . Total knee replacement status 09/2007    redo  . Diabetes mellitus without complication     type 2  . Shortness of breath     Hx: of with exertion  . Pneumonia     Hx: of  . Tingling     Hx: of in feet  . Arthritis     RA (Dr. Ouida Sills)  . Osteoarthritis   . Gastritis   . Colon polyps 2008    HYPERPLASTIC  . Chronic kidney disease     rt kidney removed    Past Surgical History  Procedure Laterality Date  . Abdominal hysterectomy  1978  . Total knee arthroplasty Right   . Hnp    . Nephrectomy Right 2010    10.rcc cancer  . Back surgery      x 2  . Colonoscopy w/ biopsies and polypectomy      Hx: of  . Lumbar laminectomy/decompression microdiscectomy Left 02/12/2013    Procedure: LUMBAR TWO THREE, LUMBAR THREE FOUR, LUMBAR FOUR FIVE  LAMINECTOMY/DECOMPRESSION MICRODISCECTOMY 3 LEVELS;  Surgeon: Charlie Pitter, MD;  Location: [facility];  Service: Neurosurgery;  Laterality: Left;  . Carpal tunnel release Left     Family History  Problem Relation Age of Onset  . Rheum arthritis Mother   . Diabetes Brother     x 2  . Colon cancer Neg Hx   . Prostate cancer Father    . Heart disease Father   . Esophageal cancer Neg Hx   . Pancreatic cancer Neg Hx   . Kidney disease Son   . Liver disease Neg Hx    Social History:  reports that she has been smoking Cigarettes.  She has a 3.75 pack-year smoking history. She has never used smokeless tobacco. She reports that she drinks alcohol. She reports that she does not use illicit drugs.  Allergies:  Allergies  Allergen Reactions  . Aspirin     REACTION: GI upset    Medications Prior to Admission  Medication Sig Dispense Refill  . acetaminophen (TYLENOL) 500 MG tablet Take 500 mg by mouth 2 (two) times daily as needed (pain).    [name] Kitchen atorvastatin (LIPITOR) 20 MG tablet Take 1 tablet (20 mg total) by mouth daily. 100 tablet 3  . cyclobenzaprine (FLEXERIL) 5 MG tablet Take 1 tablet (5 mg total) by mouth 3 (three) times daily as needed for muscle spasms. 30 tablet 1  . fluticasone (FLONASE) 50 MCG/ACT nasal spray Place 2 sprays into the nose daily.    [name] Kitchen glucose blood (ONETOUCH VERIO) test strip Use once daily for check glucose levels.  Dx E11.9 100 each 12  . HYDROcodone-acetaminophen (NORCO/VICODIN) 5-325 MG per tablet Take  1 tablet by mouth every 6 (six) hours as needed for moderate pain.   0  . hydroxychloroquine (PLAQUENIL) 200 MG tablet Take 400 mg by mouth daily. Take 2 by mouth daily    . Lancets MISC by Does not apply route.      . leflunomide (ARAVA) 10 MG tablet Take 20 mg by mouth daily.     [name] Kitchen levothyroxine (SYNTHROID, LEVOTHROID) 50 MCG tablet Take 1 tablet (50 mcg total) by mouth daily. 100 tablet 3  . losartan-hydrochlorothiazide (HYZAAR) 100-12.5 MG per tablet Take 1 tablet by mouth daily. 100 tablet 3  . metFORMIN (GLUCOPHAGE) 500 MG tablet Take 1 tablet (500 mg total) by mouth daily with breakfast. (Patient taking differently: Take 250 mg by mouth daily with breakfast. ) 100 tablet 3  . pantoprazole (PROTONIX) 40 MG tablet Take 1 tablet (40 mg total) by mouth daily. 100 tablet 3  . Probiotic Product  (RESTORA) CAPS Take 1 capsule by mouth daily. (Patient not taking: Reported on 10/08/2014) 10 capsule 0    Results for orders placed or performed during the hospital encounter of 10/15/14 (from the past 48 hour(s))  Glucose, capillary     Status: Abnormal   Collection Time: 10/15/14  8:00 AM  Result Value Ref Range   Glucose-Capillary 104 (H) 70 - 99 mg/dL   No results found.  Review of Systems  Constitutional: Negative.   HENT: Negative.   Eyes: Negative.   Respiratory: Negative.   Cardiovascular: Negative.   Gastrointestinal: Negative.   Genitourinary: Negative.   Musculoskeletal: Positive for back pain.  Skin: Negative.   Neurological: Negative.   Endo/Heme/Allergies: Negative.   Psychiatric/Behavioral: Negative.     Blood pressure 162/95, pulse 82, temperature 98.5 F (36.9 C), temperature source Oral, resp. rate 20, height 5\' 6"  (1.676 m), weight 90.719 kg (200 lb), SpO2 98 %. Physical Exam  Constitutional: She is oriented to person, place, and time. She appears well-developed and well-nourished. No distress.  HENT:  Head: Normocephalic and atraumatic.  Right Ear: External ear normal.  Left Ear: External ear normal.  Nose: Nose normal.  Mouth/Throat: Oropharynx is clear and moist.  Eyes: Conjunctivae and EOM are normal. Pupils are equal, round, and reactive to light. Right eye exhibits no discharge. Left eye exhibits no discharge.  Neck: Normal range of motion. Neck supple. No tracheal deviation present. No thyromegaly present.  Cardiovascular: Normal rate, regular rhythm, normal heart sounds and intact distal pulses.  Exam reveals no friction rub.   No murmur heard. Respiratory: Effort normal and breath sounds normal. No respiratory distress. She has no wheezes.  GI: Soft. Bowel sounds are normal. She exhibits no distension. There is no tenderness.  Musculoskeletal: Normal range of motion. She exhibits no edema or tenderness.  Neurological: She is alert and oriented to  person, place, and time. She has normal reflexes. No cranial nerve deficit. Coordination normal.  Skin: Skin is warm and dry. No rash noted. She is not diaphoretic. No erythema. No pallor.  Psychiatric: She has a normal mood and affect. Her behavior is normal. Judgment and thought content normal.     Assessment/Plan L4-5 grade 1 degenerative spondylolisthesis with associated disc herniation status post previous laminectomy. Plan reexploration laminectomy with bilateral L4-5 decompressive laminectomy and foraminotomies followed by posterior lumbar my fusion utilizing interbody peek cages, locally harvested autograft, and augmented with posterior lateral arthrodesis utilizing nonsegmental pedicle screw station and local autografting. Risks and benefits of been explained. Patient wishes to proceed.  Bion Todorov A 10/15/2014,  9:12 AM

## 2014-10-15 NOTE — Brief Op Note (Signed)
10/15/2014  12:58 PM  PATIENT:  [name]  69 y.o. female  PRE-OPERATIVE DIAGNOSIS:  Spondylolisthesis, Lumbar region  POST-OPERATIVE DIAGNOSIS:  Spondylolisthesis, Lumbar region  PROCEDURE:  Procedure(s) with comments: L4-5 Posterior lumbar interbody fusion (N/A) - L4-5 Posterior lumbar interbody fusion  SURGEON:  Surgeon(s) and Role:    * Earnie Larsson, MD - Primary    * Consuella Lose, MD - Assisting  PHYSICIAN ASSISTANT:   ASSISTANTS:    ANESTHESIA:   general  EBL:  Total I/O In: 1750 [I.V.:1500; IV Piggyback:250] Out: 320 [Urine:120; Blood:200]  BLOOD ADMINISTERED:none  DRAINS: none   LOCAL MEDICATIONS USED:  MARCAINE     SPECIMEN:  No Specimen  DISPOSITION OF SPECIMEN:  N/A  COUNTS:  YES  TOURNIQUET:  * No tourniquets in log *  DICTATION: .Dragon Dictation  PLAN OF CARE: Admit to inpatient   PATIENT DISPOSITION:  PACU - hemodynamically stable.   Delay start of Pharmacological VTE agent (>24hrs) due to surgical blood loss or risk of bleeding: yes

## 2014-10-15 NOTE — Anesthesia Preprocedure Evaluation (Addendum)
Anesthesia Evaluation  Patient identified by MRN, date of birth, ID band Patient awake    Airway Mallampati: II  TM Distance: >3 FB Neck ROM: Full    Dental   Pulmonary shortness of breath, asthma , pneumonia -, Current Smoker,  breath sounds clear to auscultation        Cardiovascular hypertension, Rhythm:Regular Rate:Normal     Neuro/Psych    GI/Hepatic Neg liver ROS, GERD-  ,  Endo/Other  diabetesHypothyroidism   Renal/GU Renal disease     Musculoskeletal   Abdominal   Peds  Hematology   Anesthesia Other Findings   Reproductive/Obstetrics                            Anesthesia Physical Anesthesia Plan  ASA: II  Anesthesia Plan: General   Post-op Pain Management:    Induction:   Airway Management Planned:   Additional Equipment:   Intra-op Plan:   Post-operative Plan: Extubation in OR  Informed Consent: I have reviewed the patients History and Physical, chart, labs and discussed the procedure including the risks, benefits and alternatives for the proposed anesthesia with the patient or authorized representative who has indicated his/her understanding and acceptance.   Dental advisory given  Plan Discussed with: CRNA and Anesthesiologist  Anesthesia Plan Comments:        Anesthesia Quick Evaluation

## 2014-10-15 NOTE — Transfer of Care (Signed)
Immediate Anesthesia Transfer of Care Note  Patient: [name]  Procedure(s) Performed: Procedure(s) with comments: L4-5 Posterior lumbar interbody fusion (N/A) - L4-5 Posterior lumbar interbody fusion  Patient Location: PACU  Anesthesia Type:General  Level of Consciousness: awake, alert  and oriented  Airway & Oxygen Therapy: Patient Spontanous Breathing and Patient connected to nasal cannula oxygen  Post-op Assessment: Report given to RN and Post -op Vital signs reviewed and stable  Post vital signs: Reviewed and stable  Last Vitals:  Filed Vitals:   10/15/14 0827  BP: 162/95  Pulse: 82  Temp: 36.9 C  Resp: 20    Complications: No apparent anesthesia complications

## 2014-10-15 NOTE — Anesthesia Postprocedure Evaluation (Signed)
  Anesthesia Post-op Note  Patient: [name]  Procedure(s) Performed: Procedure(s) with comments: L4-5 Posterior lumbar interbody fusion (N/A) - L4-5 Posterior lumbar interbody fusion  Patient Location: PACU  Anesthesia Type:General  Level of Consciousness: awake  Airway and Oxygen Therapy: Patient Spontanous Breathing  Post-op Pain: mild  Post-op Assessment: Post-op Vital signs reviewed  Post-op Vital Signs: Reviewed  Last Vitals:  Filed Vitals:   10/15/14 1530  BP:   Pulse: 79  Temp:   Resp: 21    Complications: No apparent anesthesia complications

## 2014-10-16 DIAGNOSIS — M5126 Other intervertebral disc displacement, lumbar region: Secondary | ICD-10-CM | POA: Diagnosis not present

## 2014-10-16 LAB — GLUCOSE, CAPILLARY
Glucose-Capillary: 104 mg/dL — ABNORMAL HIGH (ref 70–99)
Glucose-Capillary: 109 mg/dL — ABNORMAL HIGH (ref 70–99)
Glucose-Capillary: 97 mg/dL (ref 70–99)
Glucose-Capillary: 104 mg/dL — ABNORMAL HIGH (ref 70–99)

## 2014-10-16 NOTE — Progress Notes (Signed)
Postop day 1. Patient complains of incisional pain also with some residual left knee and anterior thigh and groin pain. Overall pain improved from preop but still significant. No other complaints.  Afebrile. Vitals are stable. Urine output good. Awake and alert. Oriented and appropriate. Motor and sensory function intact. Abdomen soft. Chest clear.  Progressing recently well. Begin efforts at mobilization today. Hopefully her residual radicular symptoms will ease and resolve.

## 2014-10-16 NOTE — Plan of Care (Signed)
Problem: Consults Goal: Diagnosis - Spinal Surgery Outcome: Completed/Met Date Met:  10/16/14 Microdiscectomy

## 2014-10-16 NOTE — Evaluation (Signed)
Physical Therapy Evaluation Patient Details Name: [name] MRN: [id number] DOB: [birth year] Today's Date: 10/16/2014   History of Present Illness  s/p posterior lumber fusion 1 level; PMH: TKR, OA, RA, back surgery in 2014,Chronic kidney disease   Clinical Impression  Patient presents with pain, generalized weakness and balance deficits impacting mobility. Tolerated short distance ambulation to chair today. Fatigued from working with OT prior to PT session. RN changing dressing due to being saturated in blood. Pt would benefit from skilled PT to improve transfers, gait, balance and mobility so pt can maximize independence and return to PLOF.     Follow Up Recommendations Home health PT;Supervision/Assistance - 24 hour    Equipment Recommendations  None recommended by PT    Recommendations for Other Services       Precautions / Restrictions Precautions Precautions: Back;Fall Precaution Booklet Issued: No Precaution Comments: Able to verbalize 2/3 precautions from OT session. Required Braces or Orthoses: Spinal Brace Spinal Brace: Lumbar corset;Applied in sitting position Restrictions Weight Bearing Restrictions: No      Mobility  Bed Mobility Overal bed mobility: Needs Assistance Bed Mobility: Rolling;Sidelying to Sit Rolling: Min guard Sidelying to sit: Min guard;HOB elevated     Sit to sidelying: Min guard General bed mobility comments: Use of rails. Cues for log roll technique.  Transfers Overall transfer level: Needs assistance Equipment used: Rolling walker (2 wheeled) Transfers: Sit to/from Stand Sit to Stand: Min guard         General transfer comment: Min guard for safety. Cues for hand placement. Transferred to chair.  Ambulation/Gait Ambulation/Gait assistance: Min guard Ambulation Distance (Feet): 5 Feet Assistive device: Rolling walker (2 wheeled) Gait Pattern/deviations: Step-through pattern;Decreased stride length   Gait velocity  interpretation: Below normal speed for age/gender General Gait Details: Pt with slow and steady gait with use of RW. Cues for upright posture.   Stairs            Wheelchair Mobility    Modified Rankin (Stroke Patients Only)       Balance Overall balance assessment: Needs assistance Sitting-balance support: Feet supported;Single extremity supported Sitting balance-Leahy Scale: Good     Standing balance support: During functional activity Standing balance-Leahy Scale: Fair Standing balance comment: able to briefly stand without UE support to complete donning of LB dressing with no LOB                             Pertinent Vitals/Pain Pain Assessment: 0-10 Pain Score: 5  Pain Location: back at surgical site Pain Descriptors / Indicators: Sore;Aching Pain Intervention(s): Monitored during session;Repositioned;Premedicated before session    Home Living Family/patient expects to be discharged to:: Private residence Living Arrangements: Children Available Help at Discharge: Family;Available 24 hours/day Type of Home: House Home Access: Stairs to enter Entrance Stairs-Rails: Psychiatric nurse of Steps: 3 Home Layout: One level Home Equipment: Walker - 2 wheels;Cane - single point;Bedside commode;Shower seat;Adaptive equipment      Prior Function Level of Independence: Independent with assistive device(s)         Comments: used a cane for ambulation PTA.     Hand Dominance        Extremity/Trunk Assessment   Upper Extremity Assessment: Defer to OT evaluation           Lower Extremity Assessment: Generalized weakness;RLE deficits/detail;LLE deficits/detail   LLE Deficits / Details: Reports radiating pain down LLE to knee.     Communication  Communication: No difficulties  Cognition Arousal/Alertness: Awake/alert Behavior During Therapy: WFL for tasks assessed/performed Overall Cognitive Status: Within Functional Limits  for tasks assessed                      General Comments General comments (skin integrity, edema, etc.): Surgical dressing saturated with blood. RN notified and came in to change bandage at end of PT session.    Exercises        Assessment/Plan    PT Assessment Patient needs continued PT services  PT Diagnosis Difficulty walking;Acute pain;Generalized weakness   PT Problem List Decreased strength;Pain;Decreased activity tolerance;Decreased balance;Decreased mobility;Decreased safety awareness  PT Treatment Interventions Balance training;Gait training;Stair training;Functional mobility training;Patient/family education;Therapeutic activities;Therapeutic exercise   PT Goals (Current goals can be found in the Care Plan section) Acute Rehab PT Goals Patient Stated Goal: to get back to walking at the gym PT Goal Formulation: With patient Time For Goal Achievement: 10/30/14 Potential to Achieve Goals: Good    Frequency Min 5X/week   Barriers to discharge        Co-evaluation               End of Session Equipment Utilized During Treatment: Gait belt;Back brace Activity Tolerance: Patient tolerated treatment well Patient left: in chair;with call bell/phone within reach;with nursing/sitter in room (No chair alarm pads on floor. Pt agreeable to press call bell for assist back to bed.) Nurse Communication: Mobility status         Time: 6759-1638 PT Time Calculation (min) (ACUTE ONLY): 16 min   Charges:   PT Evaluation $Initial PT Evaluation Tier I: 1 Procedure     PT G CodesCandy Sledge A 11/08/14, 11:14 AM Candy Sledge, PT, DPT [phone]

## 2014-10-16 NOTE — Progress Notes (Signed)
CARE MANAGEMENT NOTE 10/16/2014  Patient:  [name], [name]   Account Number:  [id number]  Date Initiated:  10/16/2014  Documentation initiated by:  Olga Coaster  Subjective/Objective Assessment:   ADMITTED FOR SURGERY     Action/Plan:   CM FOLLOWING FOR DCP   Anticipated DC Date:  10/21/2014   Anticipated DC Plan:  AWAITING FOR PT/OT EVALS FOR DISPOSITION NEEDS     DC Planning Services  CM consult         Status of service:  In process, will continue to follow  Per UR Regulation:  Reviewed for med. necessity/level of care/duration of stay  Comments:  3/16/2016Mindi Slicker RN,BSN,MHA [phone]

## 2014-10-16 NOTE — Evaluation (Signed)
Occupational Therapy Evaluation Patient Details Name: [name] MRN: [id number] DOB: 08-22-1945 Today's Date: 10/16/2014    History of Present Illness s/p posterior lumber fusion 1 level; PMH: TKR, OA, RA, back surgery in 2014,Chronic kidney disease    Clinical Impression   Pt admitted with the above diagnoses and presents with below problem list. Pt will benefit from continued acute OT to address the below listed deficits and maximize independence with BADLs prior to d/c home with family. PTA pt was mod I with ADLs. Pt currently at min A level for LB ADLs. OT to continue to follow acutely.     Follow Up Recommendations  Supervision/Assistance - 24 hour;No OT follow up    Equipment Recommendations  None recommended by OT    Recommendations for Other Services       Precautions / Restrictions Precautions Precautions: Back;Fall Precaution Booklet Issued: No Precaution Comments: educated on back precautions Required Braces or Orthoses: Spinal Brace Spinal Brace: Lumbar corset;Applied in sitting position Restrictions Weight Bearing Restrictions: No      Mobility Bed Mobility Overal bed mobility: Needs Assistance Bed Mobility: Rolling;Sidelying to Sit;Sit to Sidelying Rolling: Min guard Sidelying to sit: Min assist     Sit to sidelying: Min guard General bed mobility comments: cues for technique, min A from sidelying to sit to advance trunk while maintaining precautions  Transfers Overall transfer level: Needs assistance Equipment used: Rolling walker (2 wheeled) Transfers: Sit to/from Stand Sit to Stand: Min assist         General transfer comment: sit<>stand from EOB and 3n1; min A for assist with powerup and light assist with balance    Balance Overall balance assessment: Needs assistance Sitting-balance support: No upper extremity supported;Feet supported Sitting balance-Leahy Scale: Good     Standing balance support: Bilateral upper extremity  supported;During functional activity Standing balance-Leahy Scale: Fair Standing balance comment: able to briefly stand without UE support to complete donning of LB dressing with no LOB                            ADL Overall ADL's : Needs assistance/impaired Eating/Feeding: Set up;Sitting   Grooming: Set up;Sitting   Upper Body Bathing: Set up;Sitting   Lower Body Bathing: Sit to/from stand;Minimal assistance   Upper Body Dressing : Set up;Sitting   Lower Body Dressing: Sit to/from stand;Minimal assistance   Toilet Transfer: Min guard;Ambulation;BSC;RW   Toileting- Water quality scientist and Hygiene: Min guard;Sit to/from stand   Tub/ Shower Transfer: Minimal assistance;Shower seat;Rolling walker   Functional mobility during ADLs: Min guard;Rolling walker General ADL Comments: Min A for LB dressing to assist with power up and balance. Edcuation provided on techniques and AE for safe completion of ADLs with back precautions. Pt practiced LB dressing and completed grooming sitting at sink. Pt also practiced bed mobility. Home safety education provided (bag on rw, remove throw rugs, etc.)     Vision     Perception     Praxis      Pertinent Vitals/Pain Pain Assessment: 0-10 Pain Score: 6  Pain Location: lower back  Pain Descriptors / Indicators: Sore Pain Intervention(s): Limited activity within patient's tolerance;Monitored during session;Repositioned     Hand Dominance     Extremity/Trunk Assessment Upper Extremity Assessment Upper Extremity Assessment: Overall WFL for tasks assessed   Lower Extremity Assessment Lower Extremity Assessment: Defer to PT evaluation       Communication Communication Communication: No difficulties   Cognition Arousal/Alertness:  Awake/alert Behavior During Therapy: WFL for tasks assessed/performed Overall Cognitive Status: Within Functional Limits for tasks assessed                     General Comments        Exercises       Shoulder Instructions      Home Living Family/patient expects to be discharged to:: Private residence Living Arrangements: Children Available Help at Discharge: Family;Available 24 hours/day Type of Home: House Home Access: Stairs to enter [employer] of Steps: 3 Entrance Stairs-Rails: Right;Left Home Layout: One level     Bathroom Shower/Tub: Teacher, early years/pre: Handicapped height Bathroom Accessibility: Yes How Accessible: Accessible via walker Home Equipment: [place] - 2 wheels;Cane - single point;Bedside commode;Shower seat;Adaptive equipment Adaptive Equipment: Reacher        Prior Functioning/Environment Level of Independence: Independent with assistive device(s)        Comments: used a cane    OT Diagnosis: Acute pain   OT Problem List: Impaired balance (sitting and/or standing);Decreased knowledge of use of DME or AE;Decreased knowledge of precautions;Pain   OT Treatment/Interventions: Self-care/ADL training;DME and/or AE instruction;Therapeutic activities;Patient/family education;Balance training    OT Goals(Current goals can be found in the care plan section) Acute Rehab OT Goals Patient Stated Goal: to get back to walking at the gym OT Goal Formulation: With patient Time For Goal Achievement: 10/23/14 Potential to Achieve Goals: Good ADL Goals Pt Will Perform Grooming: with modified independence;sitting;standing;with adaptive equipment Pt Will Transfer to Toilet: with modified independence;ambulating;bedside commode Pt Will Perform Toileting - Clothing Manipulation and hygiene: with modified independence;sit to/from stand;with adaptive equipment Pt Will Perform Tub/Shower Transfer: with supervision;ambulating;rolling walker;shower seat Additional ADL Goal #1: Pt will perfrom logrolling technique at mod I level to prepare for OOB ADLs.  OT Frequency: Min 2X/week   Barriers to D/C:            Co-evaluation               End of Session Equipment Utilized During Treatment: Gait belt;Rolling walker;Back brace Nurse Communication: Other (comment) (drainage from incision site)  Activity Tolerance: Patient tolerated treatment well Patient left: in bed;with call bell/phone within reach;with SCD's reapplied   Time: 4097-3532 OT Time Calculation (min): 44 min Charges:  OT General Charges $OT Visit: 1 Procedure OT Evaluation $Initial OT Evaluation Tier I: 1 Procedure OT Treatments $Self Care/Home Management : 23-37 mins G-Codes:    Hortencia Pilar 15-Nov-2014, 10:24 AM

## 2014-10-17 LAB — GLUCOSE, CAPILLARY
Glucose-Capillary: 108 mg/dL — ABNORMAL HIGH (ref 70–99)
Glucose-Capillary: 85 mg/dL (ref 70–99)
Glucose-Capillary: 102 mg/dL — ABNORMAL HIGH (ref 70–99)

## 2014-10-17 MED ORDER — CEPHALEXIN 500 MG PO CAPS
500.0000 mg | ORAL_CAPSULE | Freq: Four times a day (QID) | ORAL | Status: DC
  Administered 2014-10-17 – 2014-10-22 (×20): 500 mg via ORAL
  Filled 2014-10-17 (×20): qty 1

## 2014-10-17 NOTE — Progress Notes (Signed)
Physical Therapy Treatment Patient Details Name: [name] MRN: [id number] DOB: 07/17/1946 Today's Date: 10/17/2014    History of Present Illness s/p posterior lumber fusion 1 level; PMH: TKR, OA, RA, back surgery in 2014,Chronic kidney disease     PT Comments    Patient had just returned to bed but agreeable to ambulate. Patient stated that she walked with nursing staff last night as well. Patient with some increased pain due to dressing change this morning. Will need to practice steps before DC (possibly tomorrow per patient report). Continue with current POC  Follow Up Recommendations  Home health PT;Supervision/Assistance - 24 hour     Equipment Recommendations  None recommended by PT    Recommendations for Other Services       Precautions / Restrictions Precautions Precautions: Back;Fall Precaution Comments: Able to recall all precuations with min cues Required Braces or Orthoses: Spinal Brace Spinal Brace: Lumbar corset;Applied in sitting position    Mobility  Bed Mobility Overal bed mobility: Needs Assistance   Rolling: Min guard Sidelying to sit: Min guard     Sit to sidelying: Min assist General bed mobility comments: Use of rails. Cues for log roll technique. HOB flat. Min A for LE backs into bed  Transfers Overall transfer level: Needs assistance Equipment used: Rolling walker (2 wheeled)   Sit to Stand: Min guard         General transfer comment: Min guard for safety. Cues for hand placement and use of RW  Ambulation/Gait Ambulation/Gait assistance: Min guard Ambulation Distance (Feet): 90 Feet Assistive device: Rolling walker (2 wheeled) Gait Pattern/deviations: Step-through pattern;Decreased stride length   Gait velocity interpretation: Below normal speed for age/gender General Gait Details: Patient with steady gait. Stated that she felt as if she was going slower than yesterday. Cues for upright posture   Stairs             Wheelchair Mobility    Modified Rankin (Stroke Patients Only)       Balance                                    Cognition Arousal/Alertness: Awake/alert Behavior During Therapy: WFL for tasks assessed/performed Overall Cognitive Status: Within Functional Limits for tasks assessed                      Exercises      General Comments        Pertinent Vitals/Pain Pain Score: 6  Pain Location: back at surgical site due to MD packing it earlier this morning Pain Descriptors / Indicators: Sore Pain Intervention(s): Monitored during session    Home Living                      Prior Function            PT Goals (current goals can now be found in the care plan section) Progress towards PT goals: Progressing toward goals    Frequency  Min 5X/week    PT Plan Current plan remains appropriate    Co-evaluation             End of Session Equipment Utilized During Treatment: Back brace Activity Tolerance: Patient tolerated treatment well Patient left: in bed;with call bell/phone within reach     Time: 1057-1111 PT Time Calculation (min) (ACUTE ONLY): 14 min  Charges:  $Gait Training: 8-22 mins  G Codes:      [name] 10/17/2014, 11:17 AM  10/17/2014 [name] PTA [phone] pager [phone] office

## 2014-10-17 NOTE — Progress Notes (Signed)
Postop day 2. Overall feeling better. Back pain much better controlled. Still having some left lower extremity pain but much improved from preop. Patient with intermittent serosanguineous drainage from her upper aspect of her wound. Reinforced last night. No headache. No intraoperative CSF leak.  Awake and alert. Oriented and appropriate. Motor and sensory function intact. Chest and abdomen benign. Dressing taken down. Dressing bloodstained. Small amount of serosanguineous drainage from the superior aspect the wound. Wound cleaned and redressed with a pressure dressing. We'll continue wound observation for now.

## 2014-10-18 LAB — GLUCOSE, CAPILLARY
Glucose-Capillary: 118 mg/dL — ABNORMAL HIGH (ref 70–99)
Glucose-Capillary: 119 mg/dL — ABNORMAL HIGH (ref 70–99)
Glucose-Capillary: 132 mg/dL — ABNORMAL HIGH (ref 70–99)
Glucose-Capillary: 134 mg/dL — ABNORMAL HIGH (ref 70–99)

## 2014-10-18 NOTE — Progress Notes (Signed)
Physical Therapy Treatment Patient Details Name: [name] MRN: [id number] DOB: April 13, 1946 Today's Date: 10/18/2014    History of Present Illness s/p posterior lumber fusion 1 level; PMH: TKR, OA, RA, back surgery in 2014,Chronic kidney disease     PT Comments    Patient not feeling well this morning. Patient stated that she felt really hot and once getting out of bed patient had to sit in recliner immediately and began fanning herself. BP was taken and was WNL. Patient rested and attempting ambulation but had to return to room due to feeling "hot" again. RN made aware. Patient would benefit from increased ambulation and stair training prior to discharging home.   Follow Up Recommendations  Home health PT;Supervision/Assistance - 24 hour     Equipment Recommendations  None recommended by PT    Recommendations for Other Services       Precautions / Restrictions Precautions Precautions: Back;Fall Precaution Comments: Able to recall all precuations Required Braces or Orthoses: Spinal Brace Spinal Brace: Lumbar corset;Applied in sitting position    Mobility  Bed Mobility Overal bed mobility: Needs Assistance   Rolling: Supervision Sidelying to sit: Supervision       General bed mobility comments: Cues to ensure log roll  Transfers Overall transfer level: Needs assistance Equipment used: Rolling walker (2 wheeled)   Sit to Stand: Min guard         General transfer comment: Min guard for safety. Patient feeling slight dizzy  Ambulation/Gait Ambulation/Gait assistance: Min guard Ambulation Distance (Feet): 50 Feet Assistive device: Rolling walker (2 wheeled) Gait Pattern/deviations: Step-through pattern;Decreased stride length   Gait velocity interpretation: Below normal speed for age/gender General Gait Details: Decreased speed and gaurded this session as patient complained of being "hot" and feeling dizzy. Patient returned to recliner. BP WNL.   Stairs            Wheelchair Mobility    Modified Rankin (Stroke Patients Only)       Balance                                    Cognition Arousal/Alertness: Awake/alert Behavior During Therapy: WFL for tasks assessed/performed Overall Cognitive Status: Within Functional Limits for tasks assessed                      Exercises      General Comments        Pertinent Vitals/Pain Pain Score: 8  Pain Location: Back going down to L leg this morning Pain Descriptors / Indicators: Shooting Pain Intervention(s): Monitored during session;Patient requesting pain meds-RN notified    Home Living                      Prior Function            PT Goals (current goals can now be found in the care plan section) Progress towards PT goals: Progressing toward goals (slowly due to dizziness)    Frequency  Min 5X/week    PT Plan Current plan remains appropriate    Co-evaluation             End of Session Equipment Utilized During Treatment: Back brace Activity Tolerance: Patient limited by fatigue;Other (comment) (dizziness. BP WNL) Patient left: with call bell/phone within reach;in chair;with chair alarm set     Time: [phone] PT Time Calculation (min) (ACUTE ONLY): 25 min  Charges:  $Gait Training: 8-22 mins $Therapeutic Activity: 8-22 mins                    G Codes:      Jacqualyn Posey 10/18/2014, 8:56 AM  10/18/2014 Jacqualyn Posey PTA [phone] pager [phone] office

## 2014-10-18 NOTE — Progress Notes (Signed)
Patient up twice this afternoon to have bowel movement. Tolerating getting up (1 assist & walker) without additional complaints.

## 2014-10-18 NOTE — Progress Notes (Signed)
Patient states that she feels worse today. Complains of being achy all over. Back pain stable. Radicular pain much improved. Still with some light serous sanguinous drainage from the superior aspect for wound. Much less than yesterday.  Low-grade fever. Vitals otherwise stable. Motor and sensory function intact. Wound healing well. Still some slight bloody drainage from the superior aspect for wound. Again nothing to suggest infection or certainly nothing to suggest CSF leak.  Progressing slowly. Continue current management. Hopefully home tomorrow.

## 2014-10-18 NOTE — Progress Notes (Signed)
Occupational Therapy Treatment Patient Details Name: [name] MRN: [id number] DOB: [birth year] Today's Date: 10/18/2014    History of present illness s/p posterior lumber fusion 1 level; PMH: TKR, OA, RA, back surgery in 2014,Chronic kidney disease    OT comments  Patient progressing towards OT goals, continue plan of care for now. Patient able to verbalize back precautions, but requires min verbal cues to adhere to them in a functional setting. Patient with increased fatigue this session and required min guard for functional mobility and transfers. Patient states her daughter will be available to provide the needed 24/7 supervision/assistance post discharge.    Follow Up Recommendations  Supervision/Assistance - 24 hour;No OT follow up    Equipment Recommendations  None recommended by OT    Recommendations for Other Services  None at this time    Precautions / Restrictions Precautions Precautions: Back;Fall Precaution Comments: Able to recall all precuations, requires cues for adhereing to them during functional tasks Required Braces or Orthoses: Spinal Brace Spinal Brace: Lumbar corset;Applied in sitting position Restrictions Weight Bearing Restrictions: No       Mobility Bed Mobility - Per PT note Overal bed mobility: Needs Assistance   Rolling: Supervision Sidelying to sit: Supervision       General bed mobility comments: Patient found seated in recliner upon OT entering room  Transfers Overall transfer level: Needs assistance Equipment used: Rolling walker (2 wheeled) Transfers: Sit to/from Stand Sit to Stand: Min guard         General transfer comment: Min guard for overall safety, patient with increased fatigue this session    Balance Overall balance assessment: Needs assistance Sitting-balance support: No upper extremity supported;Feet supported Sitting balance-Leahy Scale: Good     Standing balance support: Bilateral upper extremity  supported;During functional activity Standing balance-Leahy Scale: Fair   ADL General ADL Comments: Patient worked on LB ADLs, patient able to cross BLEs but still tends to bend. Encouraged patient to work on bringing [place] higher up to prevent bending back. Assisted patient > therapy gym and practicied tub/shower transfer using BSC, patient supervision for this. Patient with increased fatigue during this session and stated she had a fever.      Cognition   Behavior During Therapy: WFL for tasks assessed/performed Overall Cognitive Status: Within Functional Limits for tasks assessed                Pertinent Vitals/ Pain       Pain Score: 8  Pain Location: LLE thigh Pain Descriptors / Indicators: Shooting Pain Intervention(s): Monitored during session         Frequency Min 2X/week     Progress Toward Goals  OT Goals(current goals can now be found in the care plan section)  Progress towards OT goals: Progressing toward goals     Plan Discharge plan remains appropriate       End of Session Equipment Utilized During Treatment: Rolling walker;Back brace   Activity Tolerance Patient tolerated treatment well   Patient Left in chair;with call bell/phone within reach;with chair alarm set     Time: [id number] OT Time Calculation (min): 20 min  Charges: OT General Charges $OT Visit: 1 Procedure OT Treatments $Self Care/Home Management : 8-22 mins  Tekeisha Hakim , MS, OTR/L, CLT Pager: [phone]  10/18/2014, 10:51 AM

## 2014-10-19 LAB — GLUCOSE, CAPILLARY
Glucose-Capillary: 116 mg/dL — ABNORMAL HIGH (ref 70–99)
Glucose-Capillary: 80 mg/dL (ref 70–99)
Glucose-Capillary: 99 mg/dL (ref 70–99)
Glucose-Capillary: 105 mg/dL — ABNORMAL HIGH (ref 70–99)

## 2014-10-19 LAB — TYPE AND SCREEN
ABO/RH(D): A POS
Antibody Screen: POSITIVE
DAT, IgG: NEGATIVE
Donor AG Type: NEGATIVE
Donor AG Type: NEGATIVE
Unit division: 0
Unit division: 0

## 2014-10-19 NOTE — Progress Notes (Signed)
Patient ID: [name], female   DOB: January 30, 1946, 69 y.o.   MRN: [id number] Subjective:  The patient is alert and pleasant. She does not feel that she is ready to go home. She complains of left leg pain.   Objective: Vital signs in last 24 hours: Temp:  [98 F (36.7 C)-99.6 F (37.6 C)] 99.6 F (37.6 C) (03/19 0550) Pulse Rate:  [116-129] 129 (03/19 0550) Resp:  [18-20] 20 (03/19 0550) BP: (95-122)/(55-75) 102/66 mmHg (03/19 0550) SpO2:  [93 %-99 %] 95 % (03/19 0550)  Intake/Output from previous day:   Intake/Output this shift:    Physical exam the patient is alert and pleasant. She is moving all 4 extremities well.  Lab Results: No results for input(s): WBC, HGB, HCT, PLT in the last 72 hours. BMET No results for input(s): NA, K, CL, CO2, GLUCOSE, BUN, CREATININE, CALCIUM in the last 72 hours.  Studies/Results: No results found.  Assessment/Plan: Postop day #4: We will continue mobilize the patient. She may go home tomorrow.  LOS: 4 days     Nelson Noone D 10/19/2014, 9:53 AM

## 2014-10-19 NOTE — Progress Notes (Signed)
Physical Therapy Treatment Patient Details Name: [name] MRN: [id number] DOB: 08/17/1945 Today's Date: 10/19/2014    History of Present Illness s/p posterior lumber fusion 1 level; PMH: TKR, OA, RA, back surgery in 2014,Chronic kidney disease     PT Comments    Patient continues to progress. Still isnt feeling as well as she has but able to complete stair training. Patient safe to D/C from a mobility standpoint based on progression towards goals set on PT eval.    Follow Up Recommendations  Home health PT;Supervision/Assistance - 24 hour     Equipment Recommendations  None recommended by PT    Recommendations for Other Services       Precautions / Restrictions      Mobility  Bed Mobility Overal bed mobility: Modified Independent           Sit to sidelying: Modified independent (Device/Increase time)    Transfers Overall transfer level: Needs assistance Equipment used: Rolling walker (2 wheeled)   Sit to Stand: Supervision         General transfer comment: Supervision for safety  Ambulation/Gait Ambulation/Gait assistance: Supervision Ambulation Distance (Feet): 200 Feet Assistive device: Rolling walker (2 wheeled) Gait Pattern/deviations: Step-through pattern;Decreased stride length     General Gait Details: Cues for upright posture   Stairs Stairs: Yes Stairs assistance: Min guard Stair Management: Two rails Number of Stairs: 2 General stair comments: Cues for sequency and safety  Wheelchair Mobility    Modified Rankin (Stroke Patients Only)       Balance                                    Cognition Arousal/Alertness: Awake/alert Behavior During Therapy: WFL for tasks assessed/performed Overall Cognitive Status: Within Functional Limits for tasks assessed                      Exercises      General Comments        Pertinent Vitals/Pain Pain Score: 5  Pain Location: LLE and back Pain Descriptors /  Indicators: Sore Pain Intervention(s): Monitored during session    Home Living                      Prior Function            PT Goals (current goals can now be found in the care plan section) Progress towards PT goals: Progressing toward goals    Frequency  Min 5X/week    PT Plan Current plan remains appropriate    Co-evaluation             End of Session Equipment Utilized During Treatment: Back brace Activity Tolerance: Patient tolerated treatment well Patient left: in bed;with call bell/phone within reach     Time: 1102-1116 PT Time Calculation (min) (ACUTE ONLY): 14 min  Charges:  $Gait Training: 8-22 mins                    G Codes:      Jacqualyn Posey 10/19/2014, 1:09 PM  10/19/2014 Jacqualyn Posey PTA [phone] pager [phone] office

## 2014-10-20 LAB — GLUCOSE, CAPILLARY
Glucose-Capillary: 109 mg/dL — ABNORMAL HIGH (ref 70–99)
Glucose-Capillary: 107 mg/dL — ABNORMAL HIGH (ref 70–99)
Glucose-Capillary: 84 mg/dL (ref 70–99)
Glucose-Capillary: 106 mg/dL — ABNORMAL HIGH (ref 70–99)
Glucose-Capillary: 116 mg/dL — ABNORMAL HIGH (ref 70–99)

## 2014-10-20 MED ORDER — ALUM & MAG HYDROXIDE-SIMETH 200-200-20 MG/5ML PO SUSP
30.0000 mL | Freq: Four times a day (QID) | ORAL | Status: DC | PRN
Start: 2014-10-20 — End: 2014-10-25
  Administered 2014-10-20 – 2014-10-22 (×3): 30 mL via ORAL
  Filled 2014-10-20 (×3): qty 30

## 2014-10-20 MED ORDER — SODIUM CHLORIDE 0.9 % IV BOLUS (SEPSIS)
500.0000 mL | Freq: Once | INTRAVENOUS | Status: AC
  Administered 2014-10-20: 500 mL via INTRAVENOUS

## 2014-10-20 NOTE — Progress Notes (Signed)
Patient ID: [name], female   DOB: 11-15-1945, 69 y.o.   MRN: [id number] BP 87/54 mmHg  Pulse 98  Temp(Src) 97.6 F (36.4 C) (Oral)  Resp 18  Ht 5\' 6"  (1.676 m)  Wt 90.719 kg (200 lb)  BMI 32.30 kg/m2  SpO2 98% Alert and oriented x 4 Moving all extremities well Wound is clean, and dry. Blood pressure is low will give a ns bolus No discharge today

## 2014-10-21 LAB — CBC
HCT: 36.1 % (ref 36.0–46.0)
Hemoglobin: 12.6 g/dL (ref 12.0–15.0)
MCH: 29.5 pg (ref 26.0–34.0)
MCHC: 34.9 g/dL (ref 30.0–36.0)
MCV: 84.5 fL (ref 78.0–100.0)
Platelets: 231 10*3/uL (ref 150–400)
RBC: 4.27 MIL/uL (ref 3.87–5.11)
RDW: 14.9 % (ref 11.5–15.5)
WBC: 21.1 10*3/uL — ABNORMAL HIGH (ref 4.0–10.5)

## 2014-10-21 LAB — BASIC METABOLIC PANEL
Anion gap: 17 — ABNORMAL HIGH (ref 5–15)
BUN: 86 mg/dL — ABNORMAL HIGH (ref 6–23)
CO2: 20 mmol/L (ref 19–32)
Calcium: 8.1 mg/dL — ABNORMAL LOW (ref 8.4–10.5)
Chloride: 96 mmol/L (ref 96–112)
Creatinine, Ser: 4.74 mg/dL — ABNORMAL HIGH (ref 0.50–1.10)
GFR calc Af Amer: 10 mL/min — ABNORMAL LOW (ref >90)
GFR calc non Af Amer: 8 mL/min — ABNORMAL LOW (ref >90)
Glucose, Bld: 113 mg/dL — ABNORMAL HIGH (ref 70–99)
Potassium: 4.1 mmol/L (ref 3.5–5.1)
Sodium: 133 mmol/L — ABNORMAL LOW (ref 135–145)

## 2014-10-21 LAB — CLOSTRIDIUM DIFFICILE BY PCR: Toxigenic C. Difficile by PCR: POSITIVE — AB

## 2014-10-21 LAB — GLUCOSE, CAPILLARY
Glucose-Capillary: 85 mg/dL (ref 70–99)
Glucose-Capillary: 119 mg/dL — ABNORMAL HIGH (ref 70–99)
Glucose-Capillary: 103 mg/dL — ABNORMAL HIGH (ref 70–99)
Glucose-Capillary: 128 mg/dL — ABNORMAL HIGH (ref 70–99)

## 2014-10-21 MED ORDER — PANTOPRAZOLE SODIUM 40 MG PO TBEC
40.0000 mg | DELAYED_RELEASE_TABLET | Freq: Two times a day (BID) | ORAL | Status: DC
  Administered 2014-10-21 – 2014-10-23 (×6): 40 mg via ORAL
  Filled 2014-10-21 (×6): qty 1

## 2014-10-21 MED ORDER — FAMOTIDINE 20 MG PO TABS
20.0000 mg | ORAL_TABLET | Freq: Two times a day (BID) | ORAL | Status: DC
  Administered 2014-10-21 (×2): 20 mg via ORAL
  Filled 2014-10-21 (×3): qty 1

## 2014-10-21 MED ORDER — SODIUM CHLORIDE 0.9 % IV BOLUS (SEPSIS)
500.0000 mL | Freq: Once | INTRAVENOUS | Status: AC
  Administered 2014-10-21: 500 mL via INTRAVENOUS

## 2014-10-21 MED ORDER — VANCOMYCIN 50 MG/ML ORAL SOLUTION
125.0000 mg | Freq: Four times a day (QID) | ORAL | Status: DC
  Administered 2014-10-21 – 2014-10-25 (×14): 125 mg via ORAL
  Filled 2014-10-21 (×19): qty 2.5

## 2014-10-21 NOTE — Progress Notes (Signed)
CRITICAL VALUE ALERT  Critical value received:  C. Diff + PCR  Date of notification:  10/21/14  Time of notification:  1529  Critical value read back:Yes.    Nurse who received alert:  Shara Blazing, RN  MD notified (1st page):  Pool  Time of first page:    MD notified (2nd page):  Time of second page:  Responding MD:    Time MD responded:

## 2014-10-21 NOTE — Progress Notes (Signed)
Per pm RN, MD was notified about low BP (78/55) and verbal order for 500 cc bolus was given.  Bolus given, BP now 83/55.  Will notify MD.

## 2014-10-21 NOTE — Progress Notes (Signed)
Pt's daughter asking to see the RN as she is concerned her mother is "seeing people and things."  When questioning the patient, she states "when I dozed off I saw people and words." She denies any visual hallucinations when awake and is alert and oriented.  Informed both to contact RN if they notice any changes in condition. Will continue to monitor.

## 2014-10-21 NOTE — Progress Notes (Signed)
PT Cancellation Note  Patient Details Name: [name] MRN: [id number] DOB: March 19, 1946   Cancelled Treatment:    Reason Eval/Treat Not Completed: Medical issues which prohibited therapy. Patient with decreased BP. Per RN hold at this time. Will follow up as appropriate and as schedule allows   Jacqualyn Posey 10/21/2014, 7:58 AM

## 2014-10-21 NOTE — Progress Notes (Signed)
OT Cancellation Note  Patient Details Name: [name] MRN: [id number] DOB: Nov 15, 1945   Cancelled Treatment:    Reason Eval/Treat Not Completed: Medical issues which prohibited therapy. Patient with decreased BP. Per RN hold at this time. Will follow up for OT treat as appropriate and as schedule allows.   Hera Celaya , MS, OTR/L, CLT Pager: [phone]  10/21/2014, 8:34 AM

## 2014-10-21 NOTE — Progress Notes (Signed)
Patient continues to be significant hypertensive. She feels tired and achy all over. She complains of heartburn. No lower extremity pain. Back pain well controlled. No longer with any significant wound drainage.  Currently afebrile. Heart rate 102. Blood pressure 83/55. Urine output good. Frequent stools. Awake and alert. Oriented and appropriate. Motor and sensory function of the extremities normal area and wound healing well. Abdomen soft and nontender.  Patient remains significantly hypotensive. Check CBC and BMP. Start Pepcid for her heartburn. Check 12-lead EKG. Send stool for C. difficile.

## 2014-10-21 NOTE — Progress Notes (Signed)
Talked to patient with daughter present about [place] choices, patient chose [facility] for HHPT; patient has a rolling walker and a 3:1 at home; [facility] with [facility] called for arrangements; Aneta Mins [phone]

## 2014-10-22 ENCOUNTER — Inpatient Hospital Stay (HOSPITAL_COMMUNITY): Payer: Medicare HMO

## 2014-10-22 LAB — CBC WITH DIFFERENTIAL/PLATELET
Basophils Absolute: 0.2 10*3/uL — ABNORMAL HIGH (ref 0.0–0.1)
Basophils Relative: 1 % (ref 0–1)
Eosinophils Absolute: 0 10*3/uL (ref 0.0–0.7)
Eosinophils Relative: 0 % (ref 0–5)
HCT: 36.5 % (ref 36.0–46.0)
Hemoglobin: 12.8 g/dL (ref 12.0–15.0)
Lymphocytes Relative: 9 % — ABNORMAL LOW (ref 12–46)
Lymphs Abs: 1.9 10*3/uL (ref 0.7–4.0)
MCH: 29.7 pg (ref 26.0–34.0)
MCHC: 35.1 g/dL (ref 30.0–36.0)
MCV: 84.7 fL (ref 78.0–100.0)
Monocytes Absolute: 2.1 10*3/uL — ABNORMAL HIGH (ref 0.1–1.0)
Monocytes Relative: 10 % (ref 3–12)
Neutro Abs: 17.2 10*3/uL — ABNORMAL HIGH (ref 1.7–7.7)
Neutrophils Relative %: 80 % — ABNORMAL HIGH (ref 43–77)
Platelets: 262 10*3/uL (ref 150–400)
RBC: 4.31 MIL/uL (ref 3.87–5.11)
RDW: 15 % (ref 11.5–15.5)
WBC: 21.4 10*3/uL — ABNORMAL HIGH (ref 4.0–10.5)

## 2014-10-22 LAB — URINALYSIS, ROUTINE W REFLEX MICROSCOPIC
Glucose, UA: NEGATIVE mg/dL
Hgb urine dipstick: NEGATIVE
Ketones, ur: NEGATIVE mg/dL
Leukocytes, UA: NEGATIVE
Nitrite: NEGATIVE
Protein, ur: 30 mg/dL — AB
Specific Gravity, Urine: 1.022 (ref 1.005–1.030)
Urobilinogen, UA: 1 mg/dL (ref 0.0–1.0)
pH: 5 (ref 5.0–8.0)

## 2014-10-22 LAB — GLUCOSE, CAPILLARY
Glucose-Capillary: 100 mg/dL — ABNORMAL HIGH (ref 70–99)
Glucose-Capillary: 124 mg/dL — ABNORMAL HIGH (ref 70–99)
Glucose-Capillary: 143 mg/dL — ABNORMAL HIGH (ref 70–99)
Glucose-Capillary: 118 mg/dL — ABNORMAL HIGH (ref 70–99)

## 2014-10-22 LAB — URINE MICROSCOPIC-ADD ON

## 2014-10-22 MED ORDER — FAMOTIDINE 20 MG PO TABS
20.0000 mg | ORAL_TABLET | Freq: Every day | ORAL | Status: DC
  Administered 2014-10-23 – 2014-10-29 (×7): 20 mg via ORAL
  Filled 2014-10-22 (×7): qty 1

## 2014-10-22 MED ORDER — SODIUM CHLORIDE 0.9 % IV BOLUS (SEPSIS)
1000.0000 mL | Freq: Once | INTRAVENOUS | Status: AC
  Administered 2014-10-22: 1000 mL via INTRAVENOUS

## 2014-10-22 MED ORDER — DEXTROSE-NACL 5-0.9 % IV SOLN
INTRAVENOUS | Status: DC
  Administered 2014-10-22 – 2014-10-24 (×5): via INTRAVENOUS

## 2014-10-22 NOTE — Progress Notes (Signed)
Paged MD regarding suspected sepsis; pts low BP, high HR, increased respirations, and increased WBC count.  Received orders for 1L fluid bolus, blood cultures X2, and stop Keflex.  Will implement orders and continue to monitor.

## 2014-10-22 NOTE — Progress Notes (Addendum)
Occupational Therapy Treatment Patient Details Name: [name] MRN: [id number] DOB: 02/11/1946 Today's Date: 10/22/2014    History of present illness s/p posterior lumber fusion 1 level; PMH: TKR, OA, RA, back surgery in 2014,Chronic kidney disease    OT comments  Pt progressing towards acute OT goals. Limited this session by lethargy. Pt is being worked up for possible sepsis and now + for C-diff. Niece present for session and educated on home setup, precautions, and compensatory techniques.   Follow Up Recommendations  Supervision/Assistance - 24 hour;No OT follow up    Equipment Recommendations  None recommended by OT    Recommendations for Other Services      Precautions / Restrictions Precautions Precautions: Back;Fall Precaution Comments: reviewed with pt and niece Required Braces or Orthoses: Spinal Brace Spinal Brace: Lumbar corset;Applied in sitting position       Mobility Bed Mobility Overal bed mobility: Needs Assistance Bed Mobility: Rolling;Sit to Sidelying Rolling: Supervision Sidelying to sit: Min assist;HOB elevated     Sit to sidelying: Supervision General bed mobility comments: Increased time due to lethargy, cues for technique  Transfers Overall transfer level: Needs assistance Equipment used: Rolling walker (2 wheeled) Transfers: Sit to/from [facility] Sit to Stand: Min guard Stand pivot transfers: Min guard       General transfer comment: close min guard for safety, no physical assist this session    Balance Overall balance assessment: Needs assistance         Standing balance support: Bilateral upper extremity supported;During functional activity Standing balance-Leahy Scale: Fair Standing balance comment: needs rw for balance, lethargic this session                   ADL Overall ADL's : Needs assistance/impaired                                       General ADL Comments: Pt practiced  sit>stand from recliner, standpivot transfer to EOB. Educated on LB dressing technique with reacher. bed mobility practiced utilizing logrolling technique. Educated pt and niece on back precautions, home setup, and compensatory techniques.      Vision                     Perception     Praxis      Cognition   Behavior During Therapy: WFL for tasks assessed/performed Overall Cognitive Status: Within Functional Limits for tasks assessed                       Extremity/Trunk Assessment               Exercises     Shoulder Instructions       General Comments      Pertinent Vitals/ Pain       Pain Assessment: 0-10 Pain Score: 5  Pain Location: back Pain Descriptors / Indicators: Aching;Sore Pain Intervention(s): Limited activity within patient's tolerance;Monitored during session;Repositioned  Home Living                                          Prior Functioning/Environment              Frequency Min 2X/week     Progress Toward Goals  OT Goals(current goals can now  be found in the care plan section)  Progress towards OT goals: Progressing toward goals  Acute Rehab OT Goals Patient Stated Goal: to get back to walking at the gym OT Goal Formulation: With patient Time For Goal Achievement: 10/23/14 Potential to Achieve Goals: Good ADL Goals Pt Will Perform Grooming: with modified independence;sitting;standing;with adaptive equipment Pt Will Transfer to Toilet: with modified independence;ambulating;bedside commode Pt Will Perform Toileting - Clothing Manipulation and hygiene: with modified independence;sit to/from stand;with adaptive equipment Pt Will Perform Tub/Shower Transfer: with supervision;ambulating;rolling walker;shower seat Additional ADL Goal #1: Pt will perfrom logrolling technique at mod I level to prepare for OOB ADLs.  Plan Discharge plan remains appropriate    Co-evaluation                 End  of Session Equipment Utilized During Treatment: Rolling walker;Back brace   Activity Tolerance Patient tolerated treatment well;Patient limited by lethargy   Patient Left in bed;with call bell/phone within reach;with bed alarm set;with family/visitor present;with SCD's reapplied   Nurse Communication          Time: 8592-9244 OT Time Calculation (min): 20 min  Charges:  1 Procedure 8-22 mins $Self-Care/Home Management  Hortencia Pilar 10/22/2014, 11:13 AM

## 2014-10-22 NOTE — Progress Notes (Signed)
Paged MD regarding pts +CDiff sample and to get orders for Abx.  MD said to contact Pharmacy for Abx dosing protocol for CDiff.  Will contact Pharmacy and have orders implemented.

## 2014-10-22 NOTE — Progress Notes (Signed)
Physical Therapy Treatment Patient Details Name: [name] MRN: [id number] DOB: 07/19/1946 Today's Date: 10/22/2014    History of Present Illness s/p posterior lumber fusion 1 level; PMH: TKR, OA, RA, back surgery in 2014,Chronic kidney disease     PT Comments    Patient is now being worked up for possible sepsis and is now + for CDiff. Patient with marked weakness from previous session. She stated that she was indeed ready to be out of bed. Able to walk to bathroom and back but very fatigued. Once sitting patient complaining of chest pain. RN came to room. Patient complained of reflux issues and the chest feeling pain from that. All VSS throughtout session.   Follow Up Recommendations  Home health PT;Supervision/Assistance - 24 hour     Equipment Recommendations  None recommended by PT    Recommendations for Other Services       Precautions / Restrictions Precautions Precautions: Back;Fall Required Braces or Orthoses: Spinal Brace Spinal Brace: Lumbar corset;Applied in sitting position    Mobility  Bed Mobility       Sidelying to sit: Min assist;HOB elevated       General bed mobility comments: Requiring increased effort this session and use of rails. Min A to ensure balance to EOB  Transfers Overall transfer level: Needs assistance Equipment used: Rolling walker (2 wheeled)   Sit to Stand: Min assist         General transfer comment: Min A to ensure balance. Cues for safe technique. Slight instability with standing this session  Ambulation/Gait Ambulation/Gait assistance: Min assist Ambulation Distance (Feet): 30 Feet Assistive device: Rolling walker (2 wheeled) Gait Pattern/deviations: Step-through pattern;Decreased stride length;Trunk flexed Gait velocity: decreased   General Gait Details: A for safety with RW and stability. Marked weakness from previous sessions.    Stairs            Wheelchair Mobility    Modified Rankin (Stroke Patients  Only)       Balance                                    Cognition Arousal/Alertness: Awake/alert Behavior During Therapy: WFL for tasks assessed/performed Overall Cognitive Status: Within Functional Limits for tasks assessed                      Exercises      General Comments        Pertinent Vitals/Pain Pain Score: 6  Pain Location: back and chest (indegestion per RN) Pain Descriptors / Indicators: Sore;Aching Pain Intervention(s): Monitored during session;Repositioned    Home Living                      Prior Function            PT Goals (current goals can now be found in the care plan section) Progress towards PT goals: Not progressing toward goals - comment (medical issues)    Frequency  Min 5X/week    PT Plan Current plan remains appropriate    Co-evaluation             End of Session Equipment Utilized During Treatment: Back brace Activity Tolerance: Patient limited by fatigue;Patient limited by pain Patient left: in chair;with call bell/phone within reach     Time: 0930-0957 PT Time Calculation (min) (ACUTE ONLY): 27 min  Charges:  $Gait Training: 8-22 mins $Therapeutic  Activity: 8-22 mins                    G Codes:      Jacqualyn Posey 10/22/2014, 10:29 AM 10/22/2014 Jacqualyn Posey PTA [phone] pager [phone] office

## 2014-10-22 NOTE — Progress Notes (Signed)
The patient continues to complain of tiredness and some abdominal pain/indigestion. Frequent bowel movements have slowed somewhat. Laboratory studies yesterday reveal evidence of Clostridium difficile infection. Patient now under treatment with oral vancomycin. She states that her back pain is minimal. She's having no lower extremity pain.  Blood pressure now returned to normal range with a heart rate also slowing into a more normal rate range is well. The patient remains afebrile. She is awake and alert. She is oriented and appropriate although obviously appears tired but I would not call her lethargic. Cranial nerve function is intact. Motor and sensory function of her extremities are normal. Her wound is clean and dry without any evidence of infection. Abdomen is somewhat distended and mildly tender. Chest is clear.  Status post lumbar decompression infusion complicated by postoperative Clostridium difficile colitis. Continue oral antibiotics. Continue efforts at mobilization. We'll maintain on IV fluids. Check abdominal x-ray to eval for colonic distention.

## 2014-10-23 LAB — BASIC METABOLIC PANEL
Anion gap: 10 (ref 5–15)
BUN: 67 mg/dL — ABNORMAL HIGH (ref 6–23)
CO2: 19 mmol/L (ref 19–32)
Calcium: 7.6 mg/dL — ABNORMAL LOW (ref 8.4–10.5)
Chloride: 108 mmol/L (ref 96–112)
Creatinine, Ser: 2.17 mg/dL — ABNORMAL HIGH (ref 0.50–1.10)
GFR calc Af Amer: 25 mL/min — ABNORMAL LOW (ref >90)
GFR calc non Af Amer: 22 mL/min — ABNORMAL LOW (ref >90)
Glucose, Bld: 92 mg/dL (ref 70–99)
Potassium: 4 mmol/L (ref 3.5–5.1)
Sodium: 137 mmol/L (ref 135–145)

## 2014-10-23 LAB — CBC
HCT: 35.5 % — ABNORMAL LOW (ref 36.0–46.0)
Hemoglobin: 12.5 g/dL (ref 12.0–15.0)
MCH: 29.7 pg (ref 26.0–34.0)
MCHC: 35.2 g/dL (ref 30.0–36.0)
MCV: 84.3 fL (ref 78.0–100.0)
Platelets: 266 10*3/uL (ref 150–400)
RBC: 4.21 MIL/uL (ref 3.87–5.11)
RDW: 15.2 % (ref 11.5–15.5)
WBC: 27.5 10*3/uL — ABNORMAL HIGH (ref 4.0–10.5)

## 2014-10-23 LAB — GLUCOSE, CAPILLARY
Glucose-Capillary: 129 mg/dL — ABNORMAL HIGH (ref 70–99)
Glucose-Capillary: 140 mg/dL — ABNORMAL HIGH (ref 70–99)
Glucose-Capillary: 125 mg/dL — ABNORMAL HIGH (ref 70–99)
Glucose-Capillary: 118 mg/dL — ABNORMAL HIGH (ref 70–99)

## 2014-10-23 NOTE — Progress Notes (Signed)
Physical Therapy Treatment Patient Details Name: [name] MRN: [id number] DOB: October 29, 1945 Today's Date: 10/23/2014    History of Present Illness s/p posterior lumber fusion 1 level; PMH: TKR, OA, RA, back surgery in 2014,Chronic kidney disease     PT Comments    Patient continues to show fatigue with mobility. Patient stating that she was going to try to work on eating and getting her strength back to where it was. Continue to mobilize as she can tolerate and hopefully she can increase ambulation over the next day or so. Patient still planning to DC home with assistance from multiple family members.   Follow Up Recommendations  Home health PT;Supervision/Assistance - 24 hour     Equipment Recommendations  None recommended by PT    Recommendations for Other Services       Precautions / Restrictions Precautions Precautions: Back;Fall Required Braces or Orthoses: Spinal Brace Spinal Brace: Lumbar corset;Applied in sitting position    Mobility  Bed Mobility     Rolling: Supervision Sidelying to sit: Supervision       General bed mobility comments: Cues to ensure log roll technique  Transfers Overall transfer level: Needs assistance Equipment used: Rolling walker (2 wheeled)   Sit to Stand: Min guard         General transfer comment: close min guard for safety, no physical assist this session  Ambulation/Gait Ambulation/Gait assistance: Min assist Ambulation Distance (Feet): 30 Feet Assistive device: Rolling walker (2 wheeled) Gait Pattern/deviations: Step-through pattern;Decreased stride length;Trunk flexed   Gait velocity interpretation: Below normal speed for age/gender General Gait Details: A for safety with RW and stability. Patient unsafe when fatigued. Cues for safe use of walker and to maintain precautinos   Stairs            Wheelchair Mobility    Modified Rankin (Stroke Patients Only)       Balance                                     Cognition Arousal/Alertness: Awake/alert Behavior During Therapy: WFL for tasks assessed/performed Overall Cognitive Status: Within Functional Limits for tasks assessed                      Exercises      General Comments        Pertinent Vitals/Pain Pain Score: 4  Pain Location: back Pain Descriptors / Indicators: Sore Pain Intervention(s): Monitored during session    Home Living                      Prior Function            PT Goals (current goals can now be found in the care plan section) Progress towards PT goals: Progressing toward goals    Frequency  Min 5X/week    PT Plan Current plan remains appropriate    Co-evaluation             End of Session Equipment Utilized During Treatment: Back brace Activity Tolerance: Patient limited by fatigue Patient left: in chair;with call bell/phone within reach     Time: 0755-0823 PT Time Calculation (min) (ACUTE ONLY): 28 min  Charges:  $Gait Training: 8-22 mins $Therapeutic Activity: 8-22 mins                    G Codes:  Jacqualyn Posey 10/23/2014, 8:33 AM  10/23/2014 Jacqualyn Posey PTA [phone] pager [phone] office

## 2014-10-23 NOTE — Progress Notes (Signed)
Looking and feeling some better today. Patient states or abdominal discomfort is less. Stool frequency has decreased somewhat area and urine output good. She remains afebrile. Labs pending today. On exam she is awake and alert. She appears nontoxic. Her abdomen remains distended and mildly tender. Motor and sensory function of the extremities are normal. Wound clean and dry.  Making some progress. Continue oral vancomycin for treatment of her colitis. Continue hospitalization and IV hydration for now.

## 2014-10-24 ENCOUNTER — Inpatient Hospital Stay (HOSPITAL_COMMUNITY): Payer: Medicare HMO

## 2014-10-24 DIAGNOSIS — I10 Essential (primary) hypertension: Secondary | ICD-10-CM

## 2014-10-24 DIAGNOSIS — A047 Enterocolitis due to Clostridium difficile: Secondary | ICD-10-CM

## 2014-10-24 DIAGNOSIS — F05 Delirium due to known physiological condition: Secondary | ICD-10-CM

## 2014-10-24 DIAGNOSIS — N183 Chronic kidney disease, stage 3 (moderate): Secondary | ICD-10-CM

## 2014-10-24 DIAGNOSIS — D72829 Elevated white blood cell count, unspecified: Secondary | ICD-10-CM

## 2014-10-24 DIAGNOSIS — E119 Type 2 diabetes mellitus without complications: Secondary | ICD-10-CM

## 2014-10-24 DIAGNOSIS — N179 Acute kidney failure, unspecified: Secondary | ICD-10-CM

## 2014-10-24 DIAGNOSIS — A0472 Enterocolitis due to Clostridium difficile, not specified as recurrent: Secondary | ICD-10-CM | POA: Diagnosis not present

## 2014-10-24 DIAGNOSIS — K219 Gastro-esophageal reflux disease without esophagitis: Secondary | ICD-10-CM

## 2014-10-24 DIAGNOSIS — E860 Dehydration: Secondary | ICD-10-CM

## 2014-10-24 LAB — BASIC METABOLIC PANEL
Anion gap: 9 (ref 5–15)
BUN: 44 mg/dL — ABNORMAL HIGH (ref 6–23)
CO2: 22 mmol/L (ref 19–32)
Calcium: 7.1 mg/dL — ABNORMAL LOW (ref 8.4–10.5)
Chloride: 109 mmol/L (ref 96–112)
Creatinine, Ser: 1.57 mg/dL — ABNORMAL HIGH (ref 0.50–1.10)
GFR calc Af Amer: 37 mL/min — ABNORMAL LOW (ref >90)
GFR calc non Af Amer: 32 mL/min — ABNORMAL LOW (ref >90)
Glucose, Bld: 124 mg/dL — ABNORMAL HIGH (ref 70–99)
Potassium: 3.2 mmol/L — ABNORMAL LOW (ref 3.5–5.1)
Sodium: 140 mmol/L (ref 135–145)

## 2014-10-24 LAB — CBC
HCT: 34.3 % — ABNORMAL LOW (ref 36.0–46.0)
Hemoglobin: 11.8 g/dL — ABNORMAL LOW (ref 12.0–15.0)
MCH: 29.6 pg (ref 26.0–34.0)
MCHC: 34.4 g/dL (ref 30.0–36.0)
MCV: 86 fL (ref 78.0–100.0)
Platelets: 316 10*3/uL (ref 150–400)
RBC: 3.99 MIL/uL (ref 3.87–5.11)
RDW: 15.4 % (ref 11.5–15.5)
WBC: 37.8 10*3/uL — ABNORMAL HIGH (ref 4.0–10.5)

## 2014-10-24 LAB — GLUCOSE, CAPILLARY
Glucose-Capillary: 113 mg/dL — ABNORMAL HIGH (ref 70–99)
Glucose-Capillary: 101 mg/dL — ABNORMAL HIGH (ref 70–99)
Glucose-Capillary: 113 mg/dL — ABNORMAL HIGH (ref 70–99)
Glucose-Capillary: 128 mg/dL — ABNORMAL HIGH (ref 70–99)

## 2014-10-24 MED ORDER — SACCHAROMYCES BOULARDII 250 MG PO CAPS
250.0000 mg | ORAL_CAPSULE | Freq: Two times a day (BID) | ORAL | Status: DC
  Administered 2014-10-24 – 2014-10-29 (×11): 250 mg via ORAL
  Filled 2014-10-24 (×10): qty 1

## 2014-10-24 MED ORDER — POTASSIUM CHLORIDE 10 MEQ/100ML IV SOLN
10.0000 meq | INTRAVENOUS | Status: AC
  Administered 2014-10-24 (×3): 10 meq via INTRAVENOUS
  Filled 2014-10-24: qty 100

## 2014-10-24 MED ORDER — METRONIDAZOLE IN NACL 5-0.79 MG/ML-% IV SOLN
500.0000 mg | Freq: Three times a day (TID) | INTRAVENOUS | Status: AC
  Administered 2014-10-24 – 2014-10-28 (×14): 500 mg via INTRAVENOUS
  Filled 2014-10-24 (×14): qty 100

## 2014-10-24 MED ORDER — ACETAMINOPHEN 650 MG RE SUPP: 650.0000 mg | RECTAL | Status: DC | PRN

## 2014-10-24 MED ORDER — SODIUM CHLORIDE 0.9 % IV BOLUS (SEPSIS)
500.0000 mL | Freq: Once | INTRAVENOUS | Status: AC
  Administered 2014-10-24: 500 mL via INTRAVENOUS

## 2014-10-24 MED ORDER — DEXTROSE-NACL 5-0.9 % IV SOLN
INTRAVENOUS | Status: AC
  Administered 2014-10-24: 11:00:00 via INTRAVENOUS
  Administered 2014-10-24: 125 mL/h via INTRAVENOUS

## 2014-10-24 MED ORDER — ALBUTEROL SULFATE (2.5 MG/3ML) 0.083% IN NEBU: 2.5000 mg | INHALATION_SOLUTION | RESPIRATORY_TRACT | Status: DC | PRN

## 2014-10-24 MED ORDER — ACETAMINOPHEN 325 MG PO TABS
650.0000 mg | ORAL_TABLET | ORAL | Status: DC | PRN
  Administered 2014-10-29: 650 mg via ORAL
  Filled 2014-10-24: qty 2

## 2014-10-24 NOTE — Progress Notes (Addendum)
Addendum  - Due to ongoing diarrhea, will temporarily hold Plaquenil as well. - CXR: No PNA - KUB: suggestive of ileus and L inguinal region bowel gas- will d/w surgery. Change diet to NPO except meds.  Vernell Leep, MD, FACP, FHM. [facility] Pager [phone]  If 7PM-7AM, please contact night-coverage [email or web address] Password [facility] 10/24/2014, 9:59 AM

## 2014-10-24 NOTE — Progress Notes (Signed)
Overall stable. Patient still feels tired and somewhat weak. Still having some abdominal pain. One episode of emesis this morning.  She remains afebrile. Mildly tachycardic. Blood pressure within the normal range. Lab studies yesterday demonstrated continued very significant leukocytosis. Patient's acute renal failure improved with her creatinine back down to 2.17.  I still believe that the patient's primary problem relates to her Clostridium difficile colitis. I'm worried about bacterial translocation and intermittent sepsis. Her blood cultures have been negative and she has not been febrile throughout. At this point I would like to get the hospitalists involved to see if they have any other treatment recommendations. Overall she seems to be doing reasonably well following her spinal surgery.

## 2014-10-24 NOTE — Consult Note (Addendum)
[facility] Medical Consultation  [name] QMV:[id number] DOB: 1946/05/04 DOA: 10/15/2014 PCP: Joycelyn Man, MD  Primary Gastroenterologist: Dr. Erskine Emery Primary Rheumatologist: Dr. Tobie Lords  Requesting physician: Dr. Earnie Larsson Date of consultation: 10/24/2014 Reason for consultation: Evaluation and management of C. difficile diarrhea, leukocytosis and AKI  Impression/Recommendations Principal Problem:   C. difficile colitis Active Problems:   Hypothyroidism   Essential hypertension   GERD   Degenerative spondylolisthesis   Dehydration   Renal failure, acute on chronic   Leukocytosis    C. difficile colitis/diarrhea - Continue PO vancomycin 125 MG QID. - Discussed with infectious disease M.D. on call who suggested adding IV Flagyl 500 MG TID and PO Florastor (probiotics). [place]- diarrhea is a side effect. May have to DC Plaquenil for same reason. - DC laxatives/stool softeners. - Her ongoing diarrhea may be a combination of C. difficile which will take a couple days to improve with treatment and Peridot contact isolation. - Colonoscopy in 2008 showed hyperplastic polyps - If does not improve, may need to consider ID/GI consultation.  Dehydration - We'll provide NS bolus 500 mL 1 and increase IVF to 125 mL per hour  Possible ileus - We'll get stat KUB. - Downgraded diet to clear liquids. - Monitor clinically and radiologically as needed.  Acute on stage III chronic kidney disease - Precipitated by GI losses, dehydration, ARB and HCTZ. - Peak creatinine 4.7413/21. - Improving with IV fluid hydration-continue. - We'll temporarily hold HCTZ and ARB. - Patient has solitary kidney related to nephrectomy for cancer.  Essential hypertension - Soft blood pressures. - Hold HCTZ and ARB secondary to acute kidney injury. - May use when necessary IV hydralazine.  Leukocytosis - Secondary to C. Difficile - No recent steroids - Follow  CBCs closely  Confusion/? Acute encephalopathy - ? Related to pain medications - Minimize pain medications. Monitor. No focal deficits.  Controlled type II DM - Continue to monitor CBGs. Currently not on SSI. Hold oral medications.  GERD - Continue Pepcid  Hypothyroid - Continue Synthroid  Arthritis - DC Arava d/t diarrhea - Continue Plaquenil for now but may have to discontinue diarrhea doesn't improve.  Asthma - Unknown type but stable.  TRH will followup again tomorrow. Please contact me if I can be of assistance in the meanwhile. Thank you for this consultation.  Chief Complaint: Diarrhea  HPI:  69 year old African-American female with history of DM 2, HTN, GERD, hypothyroid, Arthritis ? etiology, status post lumbar laminectomy for low back pain by neurosurgery on 10/15/14. Postoperative course complicated by diarrhea and acute kidney injury. Patient was started on oral vancomycin 3 days ago but continues to have diarrhea, poor appetite, feeling unwell and intermittent nausea and vomiting. Hence hospitalist was consulted for further assistance. Patient is a poor historian-? Confused. She states that she has had diarrhea up to 2-3 times daily for the last 3-4 weeks, associated with mid abdominal pain and intermittent nausea and vomiting. However on review of records from PCP-no diarrhea listed. She had been seen in early 2015 for diarrhea which was attributed to [place] and Plaquenil which had been discontinued. Currently complains of 2-3 episodes of diarrhea daily (nursing reports 3 episodes overnight), midabdominal 10/10 dull pain which is nonradiating without aggravating or relieving factors and intermittent nausea and nonbloody emesis. Decreased appetite but feeling extremely thirsty and asking for ice/ice water. States that she has been urinating well and denies dysuria. Has cough with intermittent mild yellow sputum but denies  dyspnea, fever or chills. No reported chest  pain.  Review of Systems:  All systems reviewed and apart from history of presenting illness, negative  Past Medical History  Diagnosis Date  . Allergy   . GERD (gastroesophageal reflux disease)   . Hypertension   . Thyroid disease   . Costochondritis   . S/P TKR (total knee replacement) 09  . S/P TAH (total abdominal hysterectomy)   . Total knee replacement status 09/2007    redo  . Diabetes mellitus without complication     type 2  . Shortness of breath     Hx: of with exertion  . Pneumonia     Hx: of  . Tingling     Hx: of in feet  . Arthritis     RA (Dr. Ouida Sills)  . Osteoarthritis   . Gastritis   . Colon polyps 2008    HYPERPLASTIC  . Chronic kidney disease     rt kidney removed   Past Surgical History  Procedure Laterality Date  . Abdominal hysterectomy  1978  . Total knee arthroplasty Right   . Hnp    . Nephrectomy Right 2010    10.rcc cancer  . Back surgery      x 2  . Colonoscopy w/ biopsies and polypectomy      Hx: of  . Lumbar laminectomy/decompression microdiscectomy Left 02/12/2013    Procedure: LUMBAR TWO THREE, LUMBAR THREE FOUR, LUMBAR FOUR FIVE  LAMINECTOMY/DECOMPRESSION MICRODISCECTOMY 3 LEVELS;  Surgeon: Charlie Pitter, MD;  Location: [facility];  Service: Neurosurgery;  Laterality: Left;  . Carpal tunnel release Left    Social History:  reports that she has been smoking Cigarettes.  She has a 3.75 pack-year smoking history. She has never used smokeless tobacco. She reports that she drinks alcohol. She reports that she does not use illicit drugs.  Divorced. Lives with family. Independent of activities of daily living. States that she quit smoking 3 years ago. Denies alcohol or drug abuse.  Allergies  Allergen Reactions  . Aspirin     REACTION: GI upset   Family History  Problem Relation Age of Onset  . Rheum arthritis Mother   . Diabetes Brother     x 2  . Colon cancer Neg Hx   . Prostate cancer Father   . Heart disease Father   .  Esophageal cancer Neg Hx   . Pancreatic cancer Neg Hx   . Kidney disease Son   . Liver disease Neg Hx     Prior to Admission medications   Medication Sig Start Date End Date Taking? Authorizing Provider  acetaminophen (TYLENOL) 500 MG tablet Take 500 mg by mouth 2 (two) times daily as needed (pain).   Yes Historical Provider, MD  atorvastatin (LIPITOR) 20 MG tablet Take 1 tablet (20 mg total) by mouth daily. 12/18/13 12/18/14 Yes Dorena Cookey, MD  cyclobenzaprine (FLEXERIL) 5 MG tablet Take 1 tablet (5 mg total) by mouth 3 (three) times daily as needed for muscle spasms. 09/03/14  Yes Dorena Cookey, MD  fluticasone [facility]) 50 MCG/ACT nasal spray Place 2 sprays into the nose daily. 12/16/11  Yes Dorena Cookey, MD  glucose blood [facility] VERIO) test strip Use once daily for check glucose levels.  Dx E11.9 05/07/14  Yes Dorena Cookey, MD  HYDROcodone-acetaminophen (NORCO/VICODIN) 5-325 MG per tablet Take 1 tablet by mouth every 6 (six) hours as needed for moderate pain.  08/28/14  Yes Historical Provider, MD  hydroxychloroquine (  PLAQUENIL) 200 MG tablet Take 400 mg by mouth daily. Take 2 by mouth daily   Yes Historical Provider, MD  Lancets MISC by Does not apply route.     Yes Historical Provider, MD  leflunomide (ARAVA) 10 MG tablet Take 20 mg by mouth daily.    Yes Historical Provider, MD  levothyroxine (SYNTHROID, LEVOTHROID) 50 MCG tablet Take 1 tablet (50 mcg total) by mouth daily. 12/18/13  Yes Dorena Cookey, MD  losartan-hydrochlorothiazide (HYZAAR) 100-12.5 MG per tablet Take 1 tablet by mouth daily. 12/18/13  Yes Dorena Cookey, MD  metFORMIN (GLUCOPHAGE) 500 MG tablet Take 1 tablet (500 mg total) by mouth daily with breakfast. Patient taking differently: Take 250 mg by mouth daily with breakfast.  12/18/13  Yes Dorena Cookey, MD  pantoprazole (PROTONIX) 40 MG tablet Take 1 tablet (40 mg total) by mouth daily. 12/18/13  Yes Dorena Cookey, MD  Probiotic Product [facility]) CAPS Take 1  capsule by mouth daily. Patient not taking: Reported on 10/08/2014 10/24/13   Laban Emperor Zehr, PA-C   Physical Exam: Filed Vitals:   10/23/14 1038 10/23/14 2154 10/24/14 0145 10/24/14 0553  BP:  116/64 121/63 131/61  Pulse:  91 95 99  Temp: 97.5 F (36.4 C) 98.4 F (36.9 C) 98.8 F (37.1 C) 99 F (37.2 C)  TempSrc: Oral Oral Oral Oral  Resp:  18 18 16   Height:      Weight:      SpO2: 98% 94% 100% 98%     General exam: Moderately built and nourished pleasant elderly female patient, lying comfortably supine in bed in no obvious distress. Does not look septic or toxic.  Head, eyes and ENT: Nontraumatic and normocephalic. Pupils equally reacting to light and accommodation. Oral mucosa dry.  Neck: Supple. No JVD, carotid bruit or thyromegaly.  Lymphatics: No lymphadenopathy.  Respiratory system: Clear to auscultation. No increased work of breathing.  Cardiovascular system: S1 and S2 heard, RRR. No JVD, murmurs, gallops, clicks or pedal edema.  Gastrointestinal system: Abdomen is obese/nondistended, soft. Mild periumbilical region tenderness without peritoneal signs. Paucity of bowel sounds. No organomegaly or masses appreciated.  Central nervous system: Alert and oriented only to self and place. No focal neurological deficits.  Extremities: Symmetric 5 x 5 power. Peripheral pulses symmetrically felt.   Skin: No rashes or acute findings.  Musculoskeletal system: Negative exam. Lumbar surgical incision site clean and dry without acute findings.  Psychiatry: Pleasant and cooperative.   Labs on Admission:  Basic Metabolic Panel:  Recent Labs Lab 10/21/14 1219 10/23/14 0630  NA 133* 137  K 4.1 4.0  CL 96 108  CO2 20 19  GLUCOSE 113* 92  BUN 86* 67*  CREATININE 4.74* 2.17*  CALCIUM 8.1* 7.6*   Liver Function Tests: No results for input(s): AST, ALT, ALKPHOS, BILITOT, PROT, ALBUMIN in the last 168 hours. No results for input(s): LIPASE, AMYLASE in the last 168  hours. No results for input(s): AMMONIA in the last 168 hours. CBC:  Recent Labs Lab 10/21/14 1219 10/22/14 1210 10/23/14 0630  WBC 21.1* 21.4* 27.5*  NEUTROABS  --  17.2*  --   HGB 12.6 12.8 12.5  HCT 36.1 36.5 35.5*  MCV 84.5 84.7 84.3  PLT 231 262 266   Cardiac Enzymes: No results for input(s): CKTOTAL, CKMB, CKMBINDEX, TROPONINI in the last 168 hours. BNP: Invalid input(s): POCBNP CBG:  Recent Labs Lab 10/23/14 0646 10/23/14 1110 10/23/14 1640 10/23/14 2200 10/24/14 0622  GLUCAP 129* 140* 125*  118* 113*    Radiological Exams on Admission: Dg Abd 1 View  10/22/2014   CLINICAL DATA:  Abdominal pain  EXAM: ABDOMEN - 1 VIEW  COMPARISON:  None.  FINDINGS: Supine radiographic evaluation of the abdomen shows scattered large and small bowel gas. Distension of the colon is noted although this does not appear to be obstructive in nature. Postsurgical changes in the lower lumbar spine are seen. No free air is noted.  IMPRESSION: Mild gaseous distension of the stomach. No definitive obstructive changes are seen. Followup imaging is recommended.   Electronically Signed   By: Inez Catalina M.D.   On: 10/22/2014 16:23    EKG: Independently reviewed. 10/21/14: Sinus rhythm, normal axis, reported as ST elevation but seems to be J-point elevation diffusely in leads V1-6 and inferior leads. QTC 490 ms. Will repeat.  Time spent: 60 minutes  [facility] Pager [phone]  If 7PM-7AM, please contact night-coverage [email or web address] Password [facility] 10/24/2014, 9:46 AM

## 2014-10-24 NOTE — Progress Notes (Signed)
Went to examine the patient after a concern for bowel gas in her left groin on KUB.  She does appear to have an ileus, however she's having numerous loose watery stools secondary to c.diff.  There is no palpable groin/femoral/ventral hernia.  No surgical interventions needed.  Would keep on clears for now, advance when abdominal distension improved.  Consider suppositories.  Coralie Keens, PA-C General Surgery [facility] [phone]

## 2014-10-24 NOTE — Progress Notes (Signed)
Occupational Therapy Treatment Patient Details Name: [name] MRN: [id number] DOB: Jan 27, 1946 Today's Date: 10/24/2014    History of present illness s/p posterior lumber fusion 1 level; PMH: TKR, OA, RA, back surgery in 2014,Chronic kidney disease    OT comments  Pt progressing somewhat towards acute OT goals. Limited this session by fatigue with pt having just completed a bath and toileting. Educated pt and daughter on energy conservation techniques. Practiced transfers and bed mobility as detailed below.  Follow Up Recommendations  Supervision/Assistance - 24 hour;No OT follow up    Equipment Recommendations  None recommended by OT    Recommendations for Other Services      Precautions / Restrictions Precautions Precautions: Back;Fall Precaution Comments: reviewed with pt and niece Required Braces or Orthoses: Spinal Brace Spinal Brace: Lumbar corset;Applied in sitting position Restrictions Weight Bearing Restrictions: No       Mobility Bed Mobility Overal bed mobility: Needs Assistance Bed Mobility: Sit to Sidelying Rolling: Min assist         General bed mobility comments: Min A to maintain back precautions by assisting with advancing BLE. and tactile cueing at back.  Transfers   Equipment used: Rolling walker (2 wheeled) Transfers: Sit to/from Stand Sit to Stand: [employer] transfer comment: pt sat quickly without bringing rw in front before sitting likely due to fatigue having just completed bathing and toileting. educated on importance of placement of rw. before sitting.    Balance Overall balance assessment: Needs assistance Sitting-balance support: No upper extremity supported;Feet supported Sitting balance-Leahy Scale: Good Sitting balance - Comments: able to sit EOB and comb hair while maintaining upright posture   Standing balance support: Bilateral upper extremity supported;During functional activity Standing balance-Leahy  Scale: Fair                     ADL Overall ADL's : Needs assistance/impaired     Grooming: Set up;Sitting Grooming Details (indicate cue type and reason): sat EOB to comb hair                 Toilet Transfer: Min guard;Ambulation;Comfort height toilet;RW           Functional mobility during ADLs: Min guard;Rolling walker General ADL Comments: Pt limited by fatigue this session. Pt was in bathroom having just finished bath with nurse tech and daughter present. Pt requesting to return to bed. Educated pt and daughter on energy conservation techniques. Pt completed EOB to sidelying with min A to maintain precautions. Educated daughter on techniques and AE for asssiting with ADLs at home and observing back precautions. Pt noted to complete standing>EOB with uncontrolled descent without rw in front, likely due to fatigue. Educated on technique for sit<>stand with rw.      Vision                     Perception     Praxis      Cognition   Behavior During Therapy: [facility] for tasks assessed/performed Overall Cognitive Status: Within Functional Limits for tasks assessed                       Extremity/Trunk Assessment               Exercises     Shoulder Instructions       General Comments      Pertinent Vitals/ Pain  Pain Assessment: 0-10 Pain Score: 7  Faces Pain Scale: Hurts even more Pain Location: abdomen Pain Descriptors / Indicators: Grimacing;Sore Pain Intervention(s): Monitored during session;Limited activity within patient's tolerance;Repositioned  Home Living                                          Prior Functioning/Environment              Frequency Min 2X/week     Progress Toward Goals  OT Goals(current goals can now be found in the care plan section)  Progress towards OT goals: Progressing toward goals  Acute Rehab OT Goals Patient Stated Goal: to get back to walking at the gym OT  Goal Formulation: With patient Time For Goal Achievement: 10/23/14 Potential to Achieve Goals: Good ADL Goals Pt Will Perform Grooming: with modified independence;sitting;standing;with adaptive equipment Pt Will Transfer to Toilet: with modified independence;ambulating;bedside commode Pt Will Perform Toileting - Clothing Manipulation and hygiene: with modified independence;sit to/from stand;with adaptive equipment Pt Will Perform Tub/Shower Transfer: with supervision;ambulating;rolling walker;shower seat Additional ADL Goal #1: Pt will perfrom logrolling technique at mod I level to prepare for OOB ADLs.  Plan Discharge plan remains appropriate    Co-evaluation                 End of Session Equipment Utilized During Treatment: Rolling walker;Back brace   Activity Tolerance Patient limited by fatigue   Patient Left in bed;with call bell/phone within reach;with family/visitor present;Other (comment);with bed alarm set (No SCD pads in room)   Nurse Communication Other (comment) (no SCD pads in room)        Time: 1245-1300 OT Time Calculation (min): 15 min  Charges: OT General Charges $OT Visit: 1 Procedure OT Treatments $Self Care/Home Management : 8-22 mins  Hortencia Pilar 10/24/2014, 1:21 PM

## 2014-10-24 NOTE — Progress Notes (Signed)
PT Cancellation Note  Patient Details Name: [name] MRN: [id number] DOB: [birth year]   Cancelled Treatment:    Reason Eval/Treat Not Completed: Fatigue/lethargy limiting ability to participate. Attempted to see patient this afternoon however patient lying in bed sleeping. Per daughters report she had assisted patient with his bath and OT had just finished working with patient. Spoke with patient briefly prior to her falling back asleep. I will check up with patient tomorrow morning to attempt to progress ambulation.    Jacqualyn Posey 10/24/2014, 1:40 PM

## 2014-10-25 DIAGNOSIS — R079 Chest pain, unspecified: Secondary | ICD-10-CM | POA: Diagnosis not present

## 2014-10-25 DIAGNOSIS — E039 Hypothyroidism, unspecified: Secondary | ICD-10-CM

## 2014-10-25 DIAGNOSIS — R9431 Abnormal electrocardiogram [ECG] [EKG]: Secondary | ICD-10-CM

## 2014-10-25 DIAGNOSIS — N189 Chronic kidney disease, unspecified: Secondary | ICD-10-CM

## 2014-10-25 LAB — CBC
HCT: 34.3 % — ABNORMAL LOW (ref 36.0–46.0)
Hemoglobin: 11.9 g/dL — ABNORMAL LOW (ref 12.0–15.0)
MCH: 29.8 pg (ref 26.0–34.0)
MCHC: 34.7 g/dL (ref 30.0–36.0)
MCV: 85.8 fL (ref 78.0–100.0)
Platelets: 350 10*3/uL (ref 150–400)
RBC: 4 MIL/uL (ref 3.87–5.11)
RDW: 15.4 % (ref 11.5–15.5)
WBC: 40.2 10*3/uL — ABNORMAL HIGH (ref 4.0–10.5)

## 2014-10-25 LAB — GLUCOSE, CAPILLARY
Glucose-Capillary: 152 mg/dL — ABNORMAL HIGH (ref 70–99)
Glucose-Capillary: 104 mg/dL — ABNORMAL HIGH (ref 70–99)
Glucose-Capillary: 147 mg/dL — ABNORMAL HIGH (ref 70–99)
Glucose-Capillary: 97 mg/dL (ref 70–99)

## 2014-10-25 LAB — TROPONIN I
Troponin I: 0.06 ng/mL — ABNORMAL HIGH (ref <0.031)
Troponin I: 0.07 ng/mL — ABNORMAL HIGH (ref <0.031)

## 2014-10-25 LAB — BASIC METABOLIC PANEL
Anion gap: 10 (ref 5–15)
BUN: 29 mg/dL — ABNORMAL HIGH (ref 6–23)
CO2: 22 mmol/L (ref 19–32)
Calcium: 7.3 mg/dL — ABNORMAL LOW (ref 8.4–10.5)
Chloride: 112 mmol/L (ref 96–112)
Creatinine, Ser: 1.22 mg/dL — ABNORMAL HIGH (ref 0.50–1.10)
GFR calc Af Amer: 51 mL/min — ABNORMAL LOW (ref >90)
GFR calc non Af Amer: 44 mL/min — ABNORMAL LOW (ref >90)
Glucose, Bld: 116 mg/dL — ABNORMAL HIGH (ref 70–99)
Potassium: 3.3 mmol/L — ABNORMAL LOW (ref 3.5–5.1)
Sodium: 144 mmol/L (ref 135–145)

## 2014-10-25 LAB — MAGNESIUM: Magnesium: 2.9 mg/dL — ABNORMAL HIGH (ref 1.5–2.5)

## 2014-10-25 MED ORDER — POTASSIUM CHLORIDE CRYS ER 20 MEQ PO TBCR
40.0000 meq | EXTENDED_RELEASE_TABLET | Freq: Once | ORAL | Status: AC
  Administered 2014-10-25: 40 meq via ORAL
  Filled 2014-10-25: qty 2

## 2014-10-25 MED ORDER — SODIUM CHLORIDE 0.9 % IV SOLN
INTRAVENOUS | Status: DC
  Administered 2014-10-25: 21:00:00 via INTRAVENOUS
  Filled 2014-10-25 (×3): qty 1000

## 2014-10-25 MED ORDER — VANCOMYCIN HCL 500 MG IV SOLR
500.0000 mg | Freq: Four times a day (QID) | INTRAVENOUS | Status: DC
  Filled 2014-10-25 (×4): qty 500

## 2014-10-25 MED ORDER — ONDANSETRON HCL 4 MG/2ML IJ SOLN
4.0000 mg | INTRAMUSCULAR | Status: DC | PRN
  Administered 2014-10-26: 4 mg via INTRAVENOUS
  Filled 2014-10-25: qty 2

## 2014-10-25 MED ORDER — SODIUM CHLORIDE 0.9 % IV SOLN
INTRAVENOUS | Status: DC
  Administered 2014-10-25: 13:00:00 via INTRAVENOUS
  Filled 2014-10-25: qty 1000

## 2014-10-25 MED ORDER — GI COCKTAIL ~~LOC~~
30.0000 mL | Freq: Three times a day (TID) | ORAL | Status: DC | PRN
  Administered 2014-10-25: 30 mL via ORAL
  Filled 2014-10-25 (×2): qty 30

## 2014-10-25 MED ORDER — PANTOPRAZOLE SODIUM 40 MG PO TBEC
40.0000 mg | DELAYED_RELEASE_TABLET | Freq: Every day | ORAL | Status: DC
  Administered 2014-10-25: 40 mg via ORAL
  Filled 2014-10-25: qty 1

## 2014-10-25 MED ORDER — VANCOMYCIN HCL 500 MG IV SOLR
500.0000 mg | Freq: Four times a day (QID) | Status: AC
  Administered 2014-10-25 – 2014-10-28 (×13): 500 mg via RECTAL
  Filled 2014-10-25 (×16): qty 500

## 2014-10-25 MED ORDER — CARVEDILOL 3.125 MG PO TABS
3.1250 mg | ORAL_TABLET | Freq: Two times a day (BID) | ORAL | Status: DC
  Administered 2014-10-25 – 2014-10-27 (×5): 3.125 mg via ORAL
  Filled 2014-10-25 (×5): qty 1

## 2014-10-25 NOTE — Clinical Documentation Improvement (Signed)
Dr Trenton Gammon, Patient admitted for spinal fusion.  WBC was 5.8 on admission, on 10/21/14 was 21.1, on 10/22/14 was 21.4, on 10/23/14 was 27.5. on 10/24/14 was 37.8. on 10/25/14 was 40.2.  On 3/21 was diagnosed with C. Diff.  Blood cultures on 10/22/14 were normal. Xray on 3/24 reported  - Persisting gaseous distention of colon without evidence of perforation. RN note - Paged MD regarding suspected sepsis; pts low BP, high HR, increased respirations, and increased WBC count.  Received orders for 1L fluid bolus, blood cultures X2, and stop Keflex.  Will implement orders and continue to monitor.   Your progress note states - I still believe that the patient's primary problem relates to her Clostridium difficile colitis. I'm worried about bacterial translocation and intermittent sepsis.   Would you please help clarify the specific medical condition related to the clinical findings?  Sepsis due to C diff Severe sepsis due to c diff Severe sepsis with shock Other explanation of clinical findings Unable to clinically determine   Thank You, Margretta Sidle ,RN Clinical Documentation Specialist:    [facility]

## 2014-10-25 NOTE — Progress Notes (Signed)
Physical Therapy Treatment Patient Details Name: ALLYSSA ABRUZZESE MRN: [id number] DOB: [birth year] Today's Date: 10/25/2014    History of Present Illness s/p posterior lumber fusion 1 level; PMH: TKR, OA, RA, back surgery in 2014,Chronic kidney disease     PT Comments    Patient continues to be limited by medical status. Patient now with ileus and is limited by abdomen pain and overall weakness/fatigue. Family present throughout session and still wanting to take patient home once medically stable. Patient upset and stating, " I just want to know what is going on with me and why I am so sick". Continue with current POC and encouraged OOB as tolerated.   Follow Up Recommendations  Home health PT;Supervision/Assistance - 24 hour     Equipment Recommendations  None recommended by PT    Recommendations for Other Services       Precautions / Restrictions Precautions Precautions: Back;Fall Required Braces or Orthoses: Spinal Brace Spinal Brace: Lumbar corset;Applied in sitting position    Mobility  Bed Mobility Overal bed mobility: Needs Assistance     Sidelying to sit: Supervision       General bed mobility comments: Increased effort but no physical assist. Did utilize rails  Transfers Overall transfer level: Needs assistance Equipment used: Rolling walker (2 wheeled)   Sit to Stand: Min assist         General transfer comment: Min A to ensure balanace with stand. Cues for hand placement as patient attempted to pull up from RW  Ambulation/Gait Ambulation/Gait assistance: Min assist Ambulation Distance (Feet): 30 Feet Assistive device: Rolling walker (2 wheeled) Gait Pattern/deviations: Step-through pattern;Decreased stride length Gait velocity: decreased Gait velocity interpretation: Below normal speed for age/gender General Gait Details: A for safety with RW and stability. Patient unsafe when fatigued. Cues for safe use of walker and to maintain  precautinos   Stairs            Wheelchair Mobility    Modified Rankin (Stroke Patients Only)       Balance                                    Cognition Arousal/Alertness: Awake/alert Behavior During Therapy: WFL for tasks assessed/performed Overall Cognitive Status: Within Functional Limits for tasks assessed                      Exercises      General Comments        Pertinent Vitals/Pain Pain Score: 7  Pain Location: abdomen/back Pain Descriptors / Indicators: Sore Pain Intervention(s): Monitored during session;Repositioned    Home Living                      Prior Function            PT Goals (current goals can now be found in the care plan section) Progress towards PT goals: Progressing toward goals    Frequency  Min 5X/week    PT Plan Current plan remains appropriate    Co-evaluation             End of Session Equipment Utilized During Treatment: Back brace Activity Tolerance: Patient limited by fatigue Patient left: in chair;with call bell/phone within reach     Time: 1052-1118 PT Time Calculation (min) (ACUTE ONLY): 26 min  Charges:  $Gait Training: 8-22 mins $Therapeutic Activity: 8-22 mins  G Codes:      Jacqualyn Posey 10/25/2014, 1:47 PM 10/25/2014 Jacqualyn Posey PTA [phone] pager [phone] office

## 2014-10-25 NOTE — Progress Notes (Signed)
Patient ID: [name], female   DOB: 27-Aug-1945, 69 y.o.   MRN: [id number] Vital signs are stable Appreciate help of hospitalist service Motor function is stable Continue to observe here.

## 2014-10-25 NOTE — Clinical Documentation Improvement (Signed)
Pt admitted for spinal fusion.  BUN on admission was 14 and Creatinine was 1.14 Date           BUN         Creatinine 3/21            86               4.74 3/23            67                2.17 3/24            44                1.57 3/25            29               1.22 Pt did receive 1L of bolus for increase in HR and decrease of BP and elevated WBC. On 3/24 hospitalist service was consulted and seen by Dr. Algis Liming.  He documented dehydration and acute on chronic renal failure.  Stated lab work improved with hydration.    Would you please specify type of acute renal failure? Acute tubular necrosis Acute cortical necrosis Acute medullary necrosis  Unable to determine      Thank You, Margretta Sidle ,RN Clinical Documentation Specialist:    [facility]

## 2014-10-25 NOTE — Consult Note (Signed)
CARDIOLOGY CONSULT NOTE   Patient ID: [name] MRN: [id number] DOB/AGE: 11/11/1945 69 y.o.  Admit Date: 10/15/2014  Primary Physician: Joycelyn Man, MD Primary Cardiologist    Rodney Langton   Clinical Summary [name] is a 69 y.o.female. She has had a complicated course. Initially she had lumbar surgery. Her course during the hospitalization however his been affected by C. difficile along with dehydration and renal insufficiency. Her renal function is improving. The patient had chest discomfort on March 20 or March 21. EKGs on March 21  revealed diffuse J-point elevation. EKG the following day shows T-wave inversions that have persisted. The first troponins were drawn today with a value of 0.06. The patient has significant risk factors for coronary disease.   Allergies  Allergen Reactions  . Aspirin     REACTION: GI upset    Medications Scheduled Medications: . atorvastatin  20 mg Oral q1800  . carvedilol  3.125 mg Oral BID WC  . famotidine  20 mg Oral Daily  . fluticasone  2 spray Each Nare Daily  . levothyroxine  50 mcg Oral QAC breakfast  . metronidazole  500 mg Intravenous Q8H  . potassium chloride  40 mEq Oral Once  . saccharomyces boulardii  250 mg Oral BID  . sodium chloride  3 mL Intravenous Q12H  . vancomycin (VANCOCIN) rectal ENEMA  500 mg Rectal 4 times per day     Infusions: . sodium chloride 250 mL (10/16/14 0119)  . sodium chloride 0.9 % 1,000 mL with potassium chloride 40 mEq infusion       PRN Medications:  acetaminophen **OR** acetaminophen, albuterol, diazepam, gi cocktail, HYDROmorphone (DILAUDID) injection, menthol-cetylpyridinium **OR** phenol, ondansetron (ZOFRAN) IV, oxyCODONE-acetaminophen, sodium chloride   Past Medical History  Diagnosis Date  . Allergy   . GERD (gastroesophageal reflux disease)   . Hypertension   . Thyroid disease   . Costochondritis   . S/P TKR (total knee replacement) 09  . S/P TAH (total abdominal  hysterectomy)   . Total knee replacement status 09/2007    redo  . Diabetes mellitus without complication     type 2  . Shortness of breath     Hx: of with exertion  . Pneumonia     Hx: of  . Tingling     Hx: of in feet  . Arthritis     RA (Dr. Ouida Sills)  . Osteoarthritis   . Gastritis   . Colon polyps 2008    HYPERPLASTIC  . Chronic kidney disease     rt kidney removed    Past Surgical History  Procedure Laterality Date  . Abdominal hysterectomy  1978  . Total knee arthroplasty Right   . Hnp    . Nephrectomy Right 2010    10.rcc cancer  . Back surgery      x 2  . Colonoscopy w/ biopsies and polypectomy      Hx: of  . Lumbar laminectomy/decompression microdiscectomy Left 02/12/2013    Procedure: LUMBAR TWO THREE, LUMBAR THREE FOUR, LUMBAR FOUR FIVE  LAMINECTOMY/DECOMPRESSION MICRODISCECTOMY 3 LEVELS;  Surgeon: Charlie Pitter, MD;  Location: [facility];  Service: Neurosurgery;  Laterality: Left;  . Carpal tunnel release Left     Family History  Problem Relation Age of Onset  . Rheum arthritis Mother   . Diabetes Brother     x 2  . Colon cancer Neg Hx   . Prostate cancer Father   . Heart disease Father   .  Esophageal cancer Neg Hx   . Pancreatic cancer Neg Hx   . Kidney disease Son   . Liver disease Neg Hx     Social History [name] reports that she has been smoking Cigarettes.  She has a 3.75 pack-year smoking history. She has never used smokeless tobacco. [name] reports that she drinks alcohol.  Review of Systems Currently the patient denies fever, chills, headache, sweats, rash, change in vision, change in hearing, urinary symptoms. All other systems are reviewed and are negative.  Physical Examination Blood pressure 127/78, pulse 96, temperature 97.8 F (36.6 C), temperature source Oral, resp. rate 20, height 5\' 6"  (1.676 m), weight 200 lb (90.719 kg), SpO2 99 %.  Intake/Output Summary (Last 24 hours) at 10/25/14 1927 Last data filed at 10/25/14  1400  Gross per 24 hour  Intake    120 ml  Output      1 ml  Net    119 ml   The patient is oriented to person time and place. Affect is normal. Her son is in the room. She is a difficult historian. Head is atraumatic. Sclera and conjunctiva are normal. There is no jugular venous distention. Lungs reveal scattered rhonchi. Cardiac exam reveals an S1 and S2. Her abdomen is soft. There is no significant peripheral edema. I did not examine her surgical site.  Prior Cardiac Testing/Procedures  Lab Results  Basic Metabolic Panel:  Recent Labs Lab 10/21/14 1219 10/23/14 0630 10/24/14 0959 10/25/14 0551 10/25/14 1300  NA 133* 137 140 144  --   K 4.1 4.0 3.2* 3.3*  --   CL 96 108 109 112  --   CO2 20 19 22 22   --   GLUCOSE 113* 92 124* 116*  --   BUN 86* 67* 44* 29*  --   CREATININE 4.74* 2.17* 1.57* 1.22*  --   CALCIUM 8.1* 7.6* 7.1* 7.3*  --   MG  --   --   --   --  2.9*    Liver Function Tests: No results for input(s): AST, ALT, ALKPHOS, BILITOT, PROT, ALBUMIN in the last 168 hours.  CBC:  Recent Labs Lab 10/21/14 1219 10/22/14 1210 10/23/14 0630 10/24/14 0959 10/25/14 0551  WBC 21.1* 21.4* 27.5* 37.8* 40.2*  NEUTROABS  --  17.2*  --   --   --   HGB 12.6 12.8 12.5 11.8* 11.9*  HCT 36.1 36.5 35.5* 34.3* 34.3*  MCV 84.5 84.7 84.3 86.0 85.8  PLT 231 262 266 316 350    Cardiac Enzymes:  Recent Labs Lab 10/25/14 1315  TROPONINI 0.06*    BNP: Invalid input(s): POCBNP   Radiology: Dg Chest Port 1 View  10/24/2014   CLINICAL DATA:  Cough  EXAM: PORTABLE CHEST - 1 VIEW  COMPARISON:  March 13, 2014  FINDINGS: There is patchy bibasilar atelectatic change. Lungs elsewhere clear. Heart is upper normal in size with pulmonary vascularity within normal limits. No adenopathy. No bone lesions.  IMPRESSION: Patchy bibasilar atelectasis.  No edema or consolidation.   Electronically Signed   By: Lowella Grip III M.D.   On: 10/24/2014 10:02   Dg Abd 2 Views  10/24/2014    CLINICAL DATA:  Abdominal pain for 10 days, followup ileus  EXAM: ABDOMEN - 2 VIEW  COMPARISON:  Exam at 1441 hours compared to 10/24/2014 at 0937 hours  FINDINGS: Persisting gaseous distention of colon similar to prior exam.  No definite bowel wall thickening or free intraperitoneal air.  No small  bowel dilatation.  Prior L4-L5 fusion.  No acute osseous findings or urinary tract calcification.  IMPRESSION: Persisting gaseous distention of colon without evidence of perforation.   Electronically Signed   By: Lavonia Dana M.D.   On: 10/24/2014 16:43   Dg Abd Portable 1v  10/24/2014   CLINICAL DATA:  69 year old female with cough and abdominal pain. Suspicion of ileus. Initial encounter.  EXAM: PORTABLE ABDOMEN - 1 VIEW  COMPARISON:  10/22/2014 and earlier.  FINDINGS: Portable AP supine view at 0937 hours. Moderate gaseous distension of the transverse and left colon re - identified and stable. No dilated small bowel loops. Less of the left abdomen is visible today. Stable visualized osseous structures. No definite pneumoperitoneum on this supine view.  At the level of the left pubic rami there is suggestion of bowel gas (arrow).  IMPRESSION: Continued gaseous distension of large bowel without small bowel dilatation favors ileus.  However I note there may be left inguinal region bowel gas. As such, consider a left inguinal hernia - and if there is a palpable hernia consider a distal bowel obstruction.   Electronically Signed   By: Genevie Ann M.D.   On: 10/24/2014 09:58     ECG:  EKG showed new diffuse  J-point elevation on March 21. The following day the EKG reveals T-wave inversions across the precordium.  Telemetry:    The patient is not on telemetry at this time.  Impression and Recommendation     Chest pain and abnormal EKG.       It is not yet clear what the etiology of her chest pain and EKG changes is. Unfortunately, the first troponin value was drawn today. There was diffuse ST elevation on the EKG,  raising the question of pericarditis. T-wave inversions can be seen after ST elevation in pericarditis. It seems unlikely that she has had an ST elevation myocardial infarction. It is possible that she could have had some type of spasm. The recommendation at this point is to obtain more troponins. Two-dimensional echo will be extremely helpful. We will need to see if she has wall motion abnormalities. I agree that we should not add heparin at this time.    C. difficile colitis      Patient is receiving treatment for this.    Degenerative spondylolisthesis     Patient is post surgery this admission.      Renal failure, acute on chronic      Her creatinine was greater than 4 one point. Fortunately it has improved.  Daryel November, MD 10/25/2014, 7:27 PM

## 2014-10-25 NOTE — Progress Notes (Signed)
[facility] PROGRESS NOTE/Consult  [name] RRN:[id number] DOB: 22-Jun-1946 DOA: 10/15/2014 PCP: Joycelyn Man, MD  Assessment/Plan: #1 C. difficile colitis Patient was started on oral vancomycin 125 mg 4 times a day. Patient with noted worsening leukocytosis. Consulting physician discussed with ID who recommended addition of IV Flagyl and Florastor which was started yesterday. However and Plaquenil have been discontinued. Patient with 2 loose stools this morning. Patient also noted with concerns for ileus which is being followed by general surgery. Due to concerns for possible ileus will change to oral vancomycin to vancomycin enema. Follow.  #2 chest discomfort/ Abn EKG Patient with complaints of chest discomfort with both typical and atypical features. EKG from 10/21/2014 with some ST-T wave changes. EKG from yesterday morning with T-wave changes. Will repeat EKG. Will cycle cardiac enzymes every 6 hours 3. Check a 2-D echo. Will place on GI cocktail when necessary. We'll place on low-dose beta blocker. Will hold off on anticoagulation for now secondary to recent surgery. Will consult with cardiology for further evaluation and management.  #3 probable ileus Abdominal x-rays. Patient is being followed by general surgery. Patient currently on clear liquids. Keep potassium greater than 4. Keep magnesium greater than 2. Serial abdominal films. Follow.  #4 dehydration IV fluids.  #5 acute on chronic kidney disease stage III Precipitated by prerenal azotemia secondary to GI losses in the setting of ARB and HCTZ. Creatinine peaked at 4.74 on 10/21/2014. Patient does have a history of a solitary kidney related to nephrectomy from cancer. Renal function has improved. Continue hydration. Follow.  #6 leukocytosis Likely secondary to problem #1. Blood cultures which were drawn been negative to date. Urinalysis drawn was negative. Chest x-ray with no acute infiltrate. Continue IV Flagyl.  Will change oral vancomycin to vancomycin enemas. Follow.  #7 acute encephalopathy Improved. Patient alert and oriented 3. No focal deficits. Lab and related to pain medications.  #8 well-controlled type 2 diabetes Hemoglobin A1c was 5.6 on 06/13/2014. Oral hypoglycemic agents on hold. CBGs have ranged from 104 -152. Sliding scale insulin.  #9 hypertension Stable. Antihypertensive medications on hold.  #10 hypokalemia Likely secondary to GI losses. Replete.  #11 hypothyroidism Continue Synthroid.  #12 arthritis Route via and Plaquenil have been discontinued secondary to acute infection. Follow.  #13 asthma Stable.  #14 prophylaxis Pepcid for GI prophylaxis. SCDs for DVT prophylaxis.     Code Status: Full Family Communication: Updated patient and children at bedside. Disposition Plan: Per primary team.   Consultants:  [facility]: Dr. Algis Liming 10/24/2014  Procedures:  Abdominal x-ray 10/24/2014, 10/22/2014  Chest x-ray 10/24/2014  X-ray of the lumbar spine 10/15/2014  Reexploration L4-5 laminectomy with redo bilateral complete decompressive laminectomy and radical foraminotomies both L4 and L5 nerve roots will be required for simple interbody fusion alone. L4-5 posterior lumbar interbody fusion, L4-5 posterior lateral arthodesis--- Dr. Annette Stable 10/15/2014  Antibiotics:  IV Flagyl 10/24/2014  Oral vancomycin 10/21/2014>>> 10/25/2014  Oral Keflex 10/17/2014>>>> 10/22/2014  Vancomycin enema 10/25/2014  HPI/Subjective: Patient sitting up in chair alert and oriented. Patient complaining of midsternal chest discomfort.  Objective: Filed Vitals:   10/25/14 1701  BP: 127/78  Pulse: 96  Temp: 97.8 F (36.6 C)  Resp: 20    Intake/Output Summary (Last 24 hours) at 10/25/14 1854 Last data filed at 10/25/14 1400  Gross per 24 hour  Intake    120 ml  Output      1 ml  Net    119 ml   [employer]  10/15/14 0757 10/15/14 0827  Weight: 90.719 kg  (200 lb) 90.719 kg (200 lb)    Exam:   General:  NAD  Cardiovascular: RRR  Respiratory: CTAB  Abdomen: Soft, nontender, nondistended, positive bowel sounds.  Musculoskeletal: No clubbing cyanosis or edema.  Data Reviewed: Basic Metabolic Panel:  Recent Labs Lab 10/21/14 1219 10/23/14 0630 10/24/14 0959 10/25/14 0551 10/25/14 1300  NA 133* 137 140 144  --   K 4.1 4.0 3.2* 3.3*  --   CL 96 108 109 112  --   CO2 20 19 22 22   --   GLUCOSE 113* 92 124* 116*  --   BUN 86* 67* 44* 29*  --   CREATININE 4.74* 2.17* 1.57* 1.22*  --   CALCIUM 8.1* 7.6* 7.1* 7.3*  --   MG  --   --   --   --  2.9*   Liver Function Tests: No results for input(s): AST, ALT, ALKPHOS, BILITOT, PROT, ALBUMIN in the last 168 hours. No results for input(s): LIPASE, AMYLASE in the last 168 hours. No results for input(s): AMMONIA in the last 168 hours. CBC:  Recent Labs Lab 10/21/14 1219 10/22/14 1210 10/23/14 0630 10/24/14 0959 10/25/14 0551  WBC 21.1* 21.4* 27.5* 37.8* 40.2*  NEUTROABS  --  17.2*  --   --   --   HGB 12.6 12.8 12.5 11.8* 11.9*  HCT 36.1 36.5 35.5* 34.3* 34.3*  MCV 84.5 84.7 84.3 86.0 85.8  PLT 231 262 266 316 350   Cardiac Enzymes:  Recent Labs Lab 10/25/14 1315  TROPONINI 0.06*   BNP (last 3 results) No results for input(s): BNP in the last 8760 hours.  ProBNP (last 3 results) No results for input(s): PROBNP in the last 8760 hours.  CBG:  Recent Labs Lab 10/24/14 1652 10/24/14 2127 10/25/14 0658 10/25/14 1121 10/25/14 1625  GLUCAP 113* 128* 152* 104* 147*    Recent Results (from the past 240 hour(s))  Clostridium Difficile by PCR     Status: Abnormal   Collection Time: 10/21/14  1:52 PM  Result Value Ref Range Status   C difficile by pcr POSITIVE (A) NEGATIVE Final    Comment: CRITICAL RESULT CALLED TO, READ BACK BY AND VERIFIED WITH: T. [facility] RN 15:15 10/21/14 ([email or web address])   Culture, blood (routine x 2)     Status: None (Preliminary result)    Collection Time: 10/22/14  3:50 AM  Result Value Ref Range Status   Specimen Description BLOOD RIGHT ARM  Final   Special Requests BOTTLES DRAWN AEROBIC AND ANAEROBIC 5CC  Final   Culture   Final           BLOOD CULTURE RECEIVED NO GROWTH TO DATE CULTURE WILL BE HELD FOR 5 DAYS BEFORE ISSUING A FINAL NEGATIVE REPORT Performed at [facility]    Report Status PENDING  Incomplete  Culture, blood (routine x 2)     Status: None (Preliminary result)   Collection Time: 10/22/14  3:59 AM  Result Value Ref Range Status   Specimen Description BLOOD RIGHT HAND  Final   Special Requests BOTTLES DRAWN AEROBIC AND ANAEROBIC 5CC  Final   Culture   Final           BLOOD CULTURE RECEIVED NO GROWTH TO DATE CULTURE WILL BE HELD FOR 5 DAYS BEFORE ISSUING A FINAL NEGATIVE REPORT Performed at [facility]    Report Status PENDING  Incomplete     Studies: Dg Chest Port 1 View  10/24/2014  CLINICAL DATA:  Cough  EXAM: PORTABLE CHEST - 1 VIEW  COMPARISON:  March 13, 2014  FINDINGS: There is patchy bibasilar atelectatic change. Lungs elsewhere clear. Heart is upper normal in size with pulmonary vascularity within normal limits. No adenopathy. No bone lesions.  IMPRESSION: Patchy bibasilar atelectasis.  No edema or consolidation.   Electronically Signed   By: Lowella Grip III M.D.   On: 10/24/2014 10:02   Dg Abd 2 Views  10/24/2014   CLINICAL DATA:  Abdominal pain for 10 days, followup ileus  EXAM: ABDOMEN - 2 VIEW  COMPARISON:  Exam at 1441 hours compared to 10/24/2014 at 0937 hours  FINDINGS: Persisting gaseous distention of colon similar to prior exam.  No definite bowel wall thickening or free intraperitoneal air.  No small bowel dilatation.  Prior L4-L5 fusion.  No acute osseous findings or urinary tract calcification.  IMPRESSION: Persisting gaseous distention of colon without evidence of perforation.   Electronically Signed   By: Lavonia Dana M.D.   On: 10/24/2014 16:43   Dg Abd  Portable 1v  10/24/2014   CLINICAL DATA:  69 year old female with cough and abdominal pain. Suspicion of ileus. Initial encounter.  EXAM: PORTABLE ABDOMEN - 1 VIEW  COMPARISON:  10/22/2014 and earlier.  FINDINGS: Portable AP supine view at 0937 hours. Moderate gaseous distension of the transverse and left colon re - identified and stable. No dilated small bowel loops. Less of the left abdomen is visible today. Stable visualized osseous structures. No definite pneumoperitoneum on this supine view.  At the level of the left pubic rami there is suggestion of bowel gas (arrow).  IMPRESSION: Continued gaseous distension of large bowel without small bowel dilatation favors ileus.  However I note there may be left inguinal region bowel gas. As such, consider a left inguinal hernia - and if there is a palpable hernia consider a distal bowel obstruction.   Electronically Signed   By: Genevie Ann M.D.   On: 10/24/2014 09:58    Scheduled Meds: . atorvastatin  20 mg Oral q1800  . carvedilol  3.125 mg Oral BID WC  . famotidine  20 mg Oral Daily  . fluticasone  2 spray Each Nare Daily  . levothyroxine  50 mcg Oral QAC breakfast  . metronidazole  500 mg Intravenous Q8H  . saccharomyces boulardii  250 mg Oral BID  . sodium chloride  3 mL Intravenous Q12H  . vancomycin (VANCOCIN) rectal ENEMA  500 mg Rectal 4 times per day   Continuous Infusions: . sodium chloride 250 mL (10/16/14 0119)  . sodium chloride 0.9 % 1,000 mL infusion 75 mL/hr at 10/25/14 1325    Principal Problem:   C. difficile colitis Active Problems:   Chest pain   Hypothyroidism   Essential hypertension   GERD   Degenerative spondylolisthesis   Dehydration   Renal failure, acute on chronic   Leukocytosis   Pain in the chest   Abnormal EKG    Time spent: 40 mins    [facility] MD [facility] Pager [phone]. If 7PM-7AM, please contact night-coverage at [email or web address], password [facility] 10/25/2014, 6:54 PM  LOS: 10 days

## 2014-10-26 ENCOUNTER — Inpatient Hospital Stay (HOSPITAL_COMMUNITY): Payer: Medicare HMO

## 2014-10-26 DIAGNOSIS — R079 Chest pain, unspecified: Secondary | ICD-10-CM

## 2014-10-26 DIAGNOSIS — K567 Ileus, unspecified: Secondary | ICD-10-CM

## 2014-10-26 DIAGNOSIS — M4310 Spondylolisthesis, site unspecified: Secondary | ICD-10-CM

## 2014-10-26 LAB — BASIC METABOLIC PANEL
Anion gap: 3 — ABNORMAL LOW (ref 5–15)
BUN: 24 mg/dL — ABNORMAL HIGH (ref 6–23)
CO2: 28 mmol/L (ref 19–32)
Calcium: 7.2 mg/dL — ABNORMAL LOW (ref 8.4–10.5)
Chloride: 114 mmol/L — ABNORMAL HIGH (ref 96–112)
Creatinine, Ser: 1.1 mg/dL (ref 0.50–1.10)
GFR calc Af Amer: 58 mL/min — ABNORMAL LOW (ref >90)
GFR calc non Af Amer: 50 mL/min — ABNORMAL LOW (ref >90)
Glucose, Bld: 112 mg/dL — ABNORMAL HIGH (ref 70–99)
Potassium: 3.7 mmol/L (ref 3.5–5.1)
Sodium: 145 mmol/L (ref 135–145)

## 2014-10-26 LAB — GLUCOSE, CAPILLARY
Glucose-Capillary: 102 mg/dL — ABNORMAL HIGH (ref 70–99)
Glucose-Capillary: 101 mg/dL — ABNORMAL HIGH (ref 70–99)
Glucose-Capillary: 78 mg/dL (ref 70–99)
Glucose-Capillary: 98 mg/dL (ref 70–99)

## 2014-10-26 LAB — CBC WITH DIFFERENTIAL/PLATELET
Basophils Absolute: 0.3 10*3/uL — ABNORMAL HIGH (ref 0.0–0.1)
Basophils Relative: 1 % (ref 0–1)
Eosinophils Absolute: 0 10*3/uL (ref 0.0–0.7)
Eosinophils Relative: 0 % (ref 0–5)
HCT: 35.2 % — ABNORMAL LOW (ref 36.0–46.0)
Hemoglobin: 12.1 g/dL (ref 12.0–15.0)
Lymphocytes Relative: 7 % — ABNORMAL LOW (ref 12–46)
Lymphs Abs: 2 10*3/uL (ref 0.7–4.0)
MCH: 29.2 pg (ref 26.0–34.0)
MCHC: 34.4 g/dL (ref 30.0–36.0)
MCV: 84.8 fL (ref 78.0–100.0)
Monocytes Absolute: 3.2 10*3/uL — ABNORMAL HIGH (ref 0.1–1.0)
Monocytes Relative: 11 % (ref 3–12)
Neutro Abs: 23.6 10*3/uL — ABNORMAL HIGH (ref 1.7–7.7)
Neutrophils Relative %: 81 % — ABNORMAL HIGH (ref 43–77)
Platelets: 340 10*3/uL (ref 150–400)
RBC: 4.15 MIL/uL (ref 3.87–5.11)
RDW: 15.6 % — ABNORMAL HIGH (ref 11.5–15.5)
WBC: 29.1 10*3/uL — ABNORMAL HIGH (ref 4.0–10.5)

## 2014-10-26 LAB — TROPONIN I: Troponin I: 0.04 ng/mL — ABNORMAL HIGH (ref <0.031)

## 2014-10-26 MED ORDER — SIMETHICONE 80 MG PO CHEW
160.0000 mg | CHEWABLE_TABLET | Freq: Four times a day (QID) | ORAL | Status: AC
  Administered 2014-10-26 – 2014-10-29 (×12): 160 mg via ORAL
  Filled 2014-10-26 (×12): qty 2

## 2014-10-26 MED ORDER — POTASSIUM CHLORIDE CRYS ER 20 MEQ PO TBCR
40.0000 meq | EXTENDED_RELEASE_TABLET | Freq: Once | ORAL | Status: AC
  Administered 2014-10-26: 40 meq via ORAL
  Filled 2014-10-26: qty 2

## 2014-10-26 MED ORDER — POTASSIUM CHLORIDE 2 MEQ/ML IV SOLN
INTRAVENOUS | Status: DC
  Administered 2014-10-26 – 2014-10-28 (×3): via INTRAVENOUS
  Filled 2014-10-26 (×5): qty 1000

## 2014-10-26 NOTE — Progress Notes (Signed)
No issues overnight. Pt reports feeling much better today. Decreased watery stools.  EXAM:  BP 134/81 mmHg  Pulse 87  Temp(Src) 98.6 F (37 C) (Axillary)  Resp 18  Ht 5\' 6"  (1.676 m)  Wt 90.719 kg (200 lb)  BMI 32.30 kg/m2  SpO2 100%  Awake, alert, oriented  Speech fluent, appropriate  CN grossly intact  5/5 BUE/BLE   IMPRESSION:  69 y.o. female with C diff colitis, AKD, and EKG changes after PLIF 11 days ago. Appears to be improving.  PLAN: - Appreciate cardiology assistance. 2D echo is pending - Renal failure appears improved - Cont vanco/flagyl

## 2014-10-26 NOTE — Progress Notes (Signed)
SUBJECTIVE: No further episodes of chest pain. Less abdominal pain. Tolerating clear liquids.     Intake/Output Summary (Last 24 hours) at 10/26/14 1329 Last data filed at 10/26/14 0309  Gross per 24 hour  Intake    120 ml  Output      2 ml  Net    118 ml    Current Facility-Administered Medications  Medication Dose Route Frequency Provider Last Rate Last Dose  . 0.9 %  sodium chloride infusion  250 mL Intravenous Continuous Earnie Larsson, MD 1 mL/hr at 10/16/14 0119 250 mL at 10/16/14 0119  . acetaminophen (TYLENOL) tablet 650 mg  650 mg Oral Q4H PRN Modena Jansky, MD       Or  . acetaminophen (TYLENOL) suppository 650 mg  650 mg Rectal Q4H PRN Modena Jansky, MD      . albuterol (PROVENTIL) (2.5 MG/3ML) 0.083% nebulizer solution 2.5 mg  2.5 mg Nebulization Q2H PRN Modena Jansky, MD      . atorvastatin (LIPITOR) tablet 20 mg  20 mg Oral q1800 Earnie Larsson, MD   20 mg at 10/25/14 1803  . carvedilol (COREG) tablet 3.125 mg  3.125 mg Oral BID WC Eugenie Filler, MD   3.125 mg at 10/26/14 0914  . dextrose 5 % and 0.45% NaCl 1,000 mL with potassium chloride 40 mEq infusion   Intravenous Continuous Eugenie Filler, MD 75 mL/hr at 10/26/14 0914    . diazepam (VALIUM) tablet 5-10 mg  5-10 mg Oral Q6H PRN Earnie Larsson, MD   5 mg at 10/20/14 1742  . famotidine (PEPCID) tablet 20 mg  20 mg Oral Daily Earnie Larsson, MD   20 mg at 10/26/14 0913  . fluticasone (FLONASE) 50 MCG/ACT nasal spray 2 spray  2 spray Each Nare Daily Earnie Larsson, MD   2 spray at 10/26/14 0915  . gi cocktail (Maalox,Lidocaine,Donnatal)  30 mL Oral TID PRN Eugenie Filler, MD   30 mL at 10/25/14 1325  . HYDROmorphone (DILAUDID) injection 0.5-1 mg  0.5-1 mg Intravenous Q2H PRN Earnie Larsson, MD      . levothyroxine (SYNTHROID, LEVOTHROID) tablet 50 mcg  50 mcg Oral QAC breakfast Earnie Larsson, MD   50 mcg at 10/26/14 0914  . menthol-cetylpyridinium (CEPACOL) lozenge 3 mg  1 lozenge Oral PRN Earnie Larsson, MD       Or  .  phenol (CHLORASEPTIC) mouth spray 1 spray  1 spray Mouth/Throat PRN Earnie Larsson, MD      . metroNIDAZOLE (FLAGYL) IVPB 500 mg  500 mg Intravenous Q8H Modena Jansky, MD   500 mg at 10/26/14 0915  . ondansetron (ZOFRAN) injection 4 mg  4 mg Intravenous Q4H PRN Eugenie Filler, MD   4 mg at 10/26/14 0310  . oxyCODONE-acetaminophen (PERCOCET/ROXICET) 5-325 MG per tablet 1-2 tablet  1-2 tablet Oral Q4H PRN Earnie Larsson, MD   2 tablet at 10/25/14 0528  . saccharomyces boulardii (FLORASTOR) capsule 250 mg  250 mg Oral BID Modena Jansky, MD   250 mg at 10/26/14 0914  . sodium chloride 0.9 % injection 3 mL  3 mL Intravenous Q12H Earnie Larsson, MD   3 mL at 10/26/14 0915  . sodium chloride 0.9 % injection 3 mL  3 mL Intravenous PRN Earnie Larsson, MD      . vancomycin (VANCOCIN) 500 mg in sodium chloride irrigation 0.9 % 100 mL ENEMA  500 mg Rectal 4 times per day Irine Seal  V, MD   500 mg at 10/26/14 1239    Filed Vitals:   10/25/14 2135 10/26/14 0220 10/26/14 0612 10/26/14 0918  BP: 140/75 129/67 127/71 131/84  Pulse: 105 99 100 98  Temp: 99 F (37.2 C) 99.7 F (37.6 C) 99.1 F (37.3 C) 97 F (36.1 C)  TempSrc: Oral Oral Oral Axillary  Resp: 20 19 18 20   Height:      Weight:      SpO2: 96% 96% 96% 99%    PHYSICAL EXAM General: NAD HEENT: Normal. Neck: No JVD, no thyromegaly.  Lungs: Clear to auscultation bilaterally with normal respiratory effort. CV: Nondisplaced PMI.  Regular rate and rhythm, normal S1/S2, no S3/S4, no murmur. SCD's on. Abdomen: Soft, obese, no distention.  Neurologic: Alert and oriented x 3.  Psych: Normal affect. Musculoskeletal: Normal range of motion. No gross deformities. Extremities: No clubbing or cyanosis.   Telemetry: Sinus rhythm, PVC's, artifact.  LABS: Basic Metabolic Panel:  Recent Labs  10/25/14 0551 10/25/14 1300 10/26/14 0057  NA 144  --  145  K 3.3*  --  3.7  CL 112  --  114*  CO2 22  --  28  GLUCOSE 116*  --  112*  BUN 29*  --  24*    CREATININE 1.22*  --  1.10  CALCIUM 7.3*  --  7.2*  MG  --  2.9*  --    Liver Function Tests: No results for input(s): AST, ALT, ALKPHOS, BILITOT, PROT, ALBUMIN in the last 72 hours. No results for input(s): LIPASE, AMYLASE in the last 72 hours. CBC:  Recent Labs  10/25/14 0551 10/26/14 0057  WBC 40.2* 29.1*  NEUTROABS  --  23.6*  HGB 11.9* 12.1  HCT 34.3* 35.2*  MCV 85.8 84.8  PLT 350 340   Cardiac Enzymes:  Recent Labs  10/25/14 1315 10/25/14 1857 10/26/14 0057  TROPONINI 0.06* 0.07* 0.04*   BNP: Invalid input(s): POCBNP D-Dimer: No results for input(s): DDIMER in the last 72 hours. Hemoglobin A1C: No results for input(s): HGBA1C in the last 72 hours. Fasting Lipid Panel: No results for input(s): CHOL, HDL, LDLCALC, TRIG, CHOLHDL, LDLDIRECT in the last 72 hours. Thyroid Function Tests: No results for input(s): TSH, T4TOTAL, T3FREE, THYROIDAB in the last 72 hours.  Invalid input(s): FREET3 Anemia Panel: No results for input(s): VITAMINB12, FOLATE, FERRITIN, TIBC, IRON, RETICCTPCT in the last 72 hours.  RADIOLOGY: Dg Lumbar Spine 2-3 Views  10/15/2014   CLINICAL DATA:  Fusion of the lumbar spine  EXAM: LUMBAR SPINE - 2-3 VIEW; DG C-ARM 61-120 MIN ; fluoro time reported is 0 minutes, 22 seconds.  COMPARISON:  MRI of the lumbar spine of September 25, 2014  FINDINGS: Two fluorospot films are submitted. The patient has undergone placement of pedicle screws at L4 and L5. An intradiscal device has been placed at the L4-5 level as well.  IMPRESSION: Intraoperative images revealing posterior fusion and interdiscal device placement at L4-5 without evidence of immediate complication.   Electronically Signed   By: David  [name]   On: 10/15/2014 13:26   Dg Abd 1 View  10/22/2014   CLINICAL DATA:  Abdominal pain  EXAM: ABDOMEN - 1 VIEW  COMPARISON:  None.  FINDINGS: Supine radiographic evaluation of the abdomen shows scattered large and small bowel gas. Distension of the colon is  noted although this does not appear to be obstructive in nature. Postsurgical changes in the lower lumbar spine are seen. No free air is noted.  IMPRESSION: Mild  gaseous distension of the stomach. No definitive obstructive changes are seen. Followup imaging is recommended.   Electronically Signed   By: Inez Catalina M.D.   On: 10/22/2014 16:23   Dg Chest Port 1 View  10/24/2014   CLINICAL DATA:  Cough  EXAM: PORTABLE CHEST - 1 VIEW  COMPARISON:  March 13, 2014  FINDINGS: There is patchy bibasilar atelectatic change. Lungs elsewhere clear. Heart is upper normal in size with pulmonary vascularity within normal limits. No adenopathy. No bone lesions.  IMPRESSION: Patchy bibasilar atelectasis.  No edema or consolidation.   Electronically Signed   By: Lowella Grip III M.D.   On: 10/24/2014 10:02   Dg Abd 2 Views  10/26/2014   CLINICAL DATA:  Nausea, vomiting, diarrhea  EXAM: ABDOMEN - 2 VIEW  COMPARISON:  10/24/2014  FINDINGS: Persistent gaseous distension of the colon without significant change from prior exam. Mild distended small bowel loops in left abdomen probable mild ileus. No free abdominal air.  IMPRESSION: Persistent gaseous distension of the colon stable from prior exam. Mild distended small bowel loops in left abdomen probable mild ileus.   Electronically Signed   By: Lahoma Crocker M.D.   On: 10/26/2014 12:41   Dg Abd 2 Views  10/24/2014   CLINICAL DATA:  Abdominal pain for 10 days, followup ileus  EXAM: ABDOMEN - 2 VIEW  COMPARISON:  Exam at 1441 hours compared to 10/24/2014 at 0937 hours  FINDINGS: Persisting gaseous distention of colon similar to prior exam.  No definite bowel wall thickening or free intraperitoneal air.  No small bowel dilatation.  Prior L4-L5 fusion.  No acute osseous findings or urinary tract calcification.  IMPRESSION: Persisting gaseous distention of colon without evidence of perforation.   Electronically Signed   By: Lavonia Dana M.D.   On: 10/24/2014 16:43   Dg Abd  Portable 1v  10/24/2014   CLINICAL DATA:  69 year old female with cough and abdominal pain. Suspicion of ileus. Initial encounter.  EXAM: PORTABLE ABDOMEN - 1 VIEW  COMPARISON:  10/22/2014 and earlier.  FINDINGS: Portable AP supine view at 0937 hours. Moderate gaseous distension of the transverse and left colon re - identified and stable. No dilated small bowel loops. Less of the left abdomen is visible today. Stable visualized osseous structures. No definite pneumoperitoneum on this supine view.  At the level of the left pubic rami there is suggestion of bowel gas (arrow).  IMPRESSION: Continued gaseous distension of large bowel without small bowel dilatation favors ileus.  However I note there may be left inguinal region bowel gas. As such, consider a left inguinal hernia - and if there is a palpable hernia consider a distal bowel obstruction.   Electronically Signed   By: Genevie Ann M.D.   On: 10/24/2014 09:58   Dg C-arm 1-60 Min  10/15/2014   CLINICAL DATA:  Fusion of the lumbar spine  EXAM: LUMBAR SPINE - 2-3 VIEW; DG C-ARM 61-120 MIN ; fluoro time reported is 0 minutes, 22 seconds.  COMPARISON:  MRI of the lumbar spine of September 25, 2014  FINDINGS: Two fluorospot films are submitted. The patient has undergone placement of pedicle screws at L4 and L5. An intradiscal device has been placed at the L4-5 level as well.  IMPRESSION: Intraoperative images revealing posterior fusion and interdiscal device placement at L4-5 without evidence of immediate complication.   Electronically Signed   By: David  [name]   On: 10/15/2014 13:26      ASSESSMENT AND PLAN: 1.  Chest pain: Unclear etiology at this time. No symptom recurrence. Troponins have trended down to 0.04 today. Echocardiogram pending. No pericardial friction rub. Can continue Lipitor and Coreg for now. 2. Renal failure: Creatinine has normalized with IV fluids. 3. C diff colitis: Receiving IV fluids. Symptoms improving. Abdominal films reviewed. On  clear liquid diet. On vanco and Flagyl.   Kate Sable, M.D., F.A.C.C.

## 2014-10-26 NOTE — Progress Notes (Signed)
[facility] PROGRESS NOTE/Consult  [name]:[id number] DOB: September 20, 1945 DOA: 10/15/2014 PCP: Joycelyn Man, MD  Assessment/Plan: #1 C. difficile colitis Patient was started on oral vancomycin 125 mg 4 times a day. Patient with noted worsening leukocytosis. Consulting physician discussed with ID who recommended addition of IV Flagyl and Florastor which was started yesterday. Arava and Plaquenil have been discontinued. Patient with 1 loose stools this morning. Patient also noted with concerns for ileus which is being followed by general surgery. Patient tolerating clear liquids. Patient denies any abdominal pain. Some clinical improvement. Leukocytosis trending down. Continue vancomycin enemas and IV Flagyl.  #2 chest discomfort/ Abn EKG Patient denies any chest discomfort today. Yesterday patient with chest discomfort with both typical and atypical features. EKG from 10/21/2014 with some ST-T wave changes. EKG from 10/24/2014 with T-wave changes. Cardiac enzymes minimally elevated and slowly trending down. 2-D echo pending. Continue low-dose beta blocker. Patient has been seen in consultation by cardiology who does not feel like patient has had an acute MI. No need for blood status at this time. GI cocktail when necessary. Cardiology following and appreciate input and recommendations.   #3 probable ileus Abdominal x-rays. Patient is being followed by general surgery. Patient tolerating clear liquids. Keep potassium greater than 4. Keep magnesium greater than 2. Serial abdominal films. Follow.  #4 dehydration IV fluids.  #5 acute on chronic kidney disease stage III Precipitated by prerenal azotemia secondary to GI losses in the setting of ARB and HCTZ. Creatinine peaked at 4.74 on 10/21/2014. Patient does have a history of a solitary kidney related to nephrectomy from cancer. Renal function has improved. Continue gentle hydration. Follow.  #6 leukocytosis Likely secondary to  problem #1. Blood cultures which were drawn been negative to date. Urinalysis drawn was negative. Chest x-ray with no acute infiltrate. Leukocytosis trending down. Continue vancomycin enemas and IV Flagyl. Follow.  #7 acute encephalopathy Improved. Patient alert and oriented 3. No focal deficits. Likely related to pain medications.   #8 L4-5 grade 1 degenerative spondylolisthesis with disc herniation status post prior laminectomy Patient status post reexploration laminotomy with bilateral L4-5 decompressive laminectomy and foraminotomies followed by posterior lumbar fusion per Dr. Annette Stable 10/15/2014. Per primary team.  #9 well-controlled type 2 diabetes Hemoglobin A1c was 5.6 on 06/13/2014. Oral hypoglycemic agents on hold. CBGs have ranged from 104 -152. Sliding scale insulin.  #10 hypertension Stable. Antihypertensive medications on hold.  #11 hypokalemia Likely secondary to GI losses. Replete and keep > 4.  #12 hypothyroidism Continue Synthroid.  #13 arthritis Arava and Plaquenil have been discontinued secondary to acute infection. Follow.  #14 asthma Stable.  #15 prophylaxis Pepcid for GI prophylaxis. SCDs for DVT prophylaxis.     Code Status: Full Family Communication: Updated patient and children at bedside. Disposition Plan: Per primary team.   Consultants:  [facility]: Dr. Algis Liming 10/24/2014  Cardiology: Dr. Ron Parker 10/25/2014  Procedures:  Abdominal x-ray 10/24/2014, 10/22/2014  Chest x-ray 10/24/2014  X-ray of the lumbar spine 10/15/2014  Reexploration L4-5 laminectomy with redo bilateral complete decompressive laminectomy and radical foraminotomies both L4 and L5 nerve roots will be required for simple interbody fusion alone. L4-5 posterior lumbar interbody fusion, L4-5 posterior lateral arthodesis--- Dr. Annette Stable 10/15/2014  Antibiotics:  IV Flagyl 10/24/2014  Oral vancomycin 10/21/2014>>> 10/25/2014  Oral Keflex 10/17/2014>>>>  10/22/2014  Vancomycin enema 10/25/2014  HPI/Subjective: Patient states she's feeling better. Patient denies any nausea or emesis states she is tolerating clear liquids. Patient states 1 loose stool this morning. Patient denies  any chest pain.  Objective: Filed Vitals:   10/26/14 0918  BP: 131/84  Pulse: 98  Temp: 97 F (36.1 C)  Resp: 20    Intake/Output Summary (Last 24 hours) at 10/26/14 1248 Last data filed at 10/26/14 0309  Gross per 24 hour  Intake    120 ml  Output      2 ml  Net    118 ml   Filed Weights   10/15/14 0757 10/15/14 0827  Weight: 90.719 kg (200 lb) 90.719 kg (200 lb)    Exam:   General:  NAD  Cardiovascular: RRR  Respiratory: CTAB  Abdomen: Soft, nontender, nondistended, positive bowel sounds.  Musculoskeletal: No clubbing cyanosis or edema.  Data Reviewed: Basic Metabolic Panel:  Recent Labs Lab 10/21/14 1219 10/23/14 0630 10/24/14 0959 10/25/14 0551 10/25/14 1300 10/26/14 0057  NA 133* 137 140 144  --  145  K 4.1 4.0 3.2* 3.3*  --  3.7  CL 96 108 109 112  --  114*  CO2 20 19 22 22   --  28  GLUCOSE 113* 92 124* 116*  --  112*  BUN 86* 67* 44* 29*  --  24*  CREATININE 4.74* 2.17* 1.57* 1.22*  --  1.10  CALCIUM 8.1* 7.6* 7.1* 7.3*  --  7.2*  MG  --   --   --   --  2.9*  --    Liver Function Tests: No results for input(s): AST, ALT, ALKPHOS, BILITOT, PROT, ALBUMIN in the last 168 hours. No results for input(s): LIPASE, AMYLASE in the last 168 hours. No results for input(s): AMMONIA in the last 168 hours. CBC:  Recent Labs Lab 10/22/14 1210 10/23/14 0630 10/24/14 0959 10/25/14 0551 10/26/14 0057  WBC 21.4* 27.5* 37.8* 40.2* 29.1*  NEUTROABS 17.2*  --   --   --  23.6*  HGB 12.8 12.5 11.8* 11.9* 12.1  HCT 36.5 35.5* 34.3* 34.3* 35.2*  MCV 84.7 84.3 86.0 85.8 84.8  PLT 262 266 316 350 340   Cardiac Enzymes:  Recent Labs Lab 10/25/14 1315 10/25/14 1857 10/26/14 0057  TROPONINI 0.06* 0.07* 0.04*   BNP (last 3  results) No results for input(s): BNP in the last 8760 hours.  ProBNP (last 3 results) No results for input(s): PROBNP in the last 8760 hours.  CBG:  Recent Labs Lab 10/25/14 1121 10/25/14 1625 10/25/14 2134 10/26/14 0716 10/26/14 1120  GLUCAP 104* 147* 97 102* 101*    Recent Results (from the past 240 hour(s))  Clostridium Difficile by PCR     Status: Abnormal   Collection Time: 10/21/14  1:52 PM  Result Value Ref Range Status   C difficile by pcr POSITIVE (A) NEGATIVE Final    Comment: CRITICAL RESULT CALLED TO, READ BACK BY AND VERIFIED WITH: [facility] RN 15:15 10/21/14 ([email or web address])   Culture, blood (routine x 2)     Status: None (Preliminary result)   Collection Time: 10/22/14  3:50 AM  Result Value Ref Range Status   Specimen Description BLOOD RIGHT ARM  Final   Special Requests BOTTLES DRAWN AEROBIC AND ANAEROBIC 5CC  Final   Culture   Final           BLOOD CULTURE RECEIVED NO GROWTH TO DATE CULTURE WILL BE HELD FOR 5 DAYS BEFORE ISSUING A FINAL NEGATIVE REPORT Performed at [facility]    Report Status PENDING  Incomplete  Culture, blood (routine x 2)     Status: None (Preliminary result)   Collection  Time: 10/22/14  3:59 AM  Result Value Ref Range Status   Specimen Description BLOOD RIGHT HAND  Final   Special Requests BOTTLES DRAWN AEROBIC AND ANAEROBIC 5CC  Final   Culture   Final           BLOOD CULTURE RECEIVED NO GROWTH TO DATE CULTURE WILL BE HELD FOR 5 DAYS BEFORE ISSUING A FINAL NEGATIVE REPORT Performed at [facility]    Report Status PENDING  Incomplete     Studies: Dg Abd 2 Views  10/26/2014   CLINICAL DATA:  Nausea, vomiting, diarrhea  EXAM: ABDOMEN - 2 VIEW  COMPARISON:  10/24/2014  FINDINGS: Persistent gaseous distension of the colon without significant change from prior exam. Mild distended small bowel loops in left abdomen probable mild ileus. No free abdominal air.  IMPRESSION: Persistent gaseous distension of the colon stable  from prior exam. Mild distended small bowel loops in left abdomen probable mild ileus.   Electronically Signed   By: Lahoma Crocker M.D.   On: 10/26/2014 12:41   Dg Abd 2 Views  10/24/2014   CLINICAL DATA:  Abdominal pain for 10 days, followup ileus  EXAM: ABDOMEN - 2 VIEW  COMPARISON:  Exam at 1441 hours compared to 10/24/2014 at 0937 hours  FINDINGS: Persisting gaseous distention of colon similar to prior exam.  No definite bowel wall thickening or free intraperitoneal air.  No small bowel dilatation.  Prior L4-L5 fusion.  No acute osseous findings or urinary tract calcification.  IMPRESSION: Persisting gaseous distention of colon without evidence of perforation.   Electronically Signed   By: Lavonia Dana M.D.   On: 10/24/2014 16:43    Scheduled Meds: . atorvastatin  20 mg Oral q1800  . carvedilol  3.125 mg Oral BID WC  . famotidine  20 mg Oral Daily  . fluticasone  2 spray Each Nare Daily  . levothyroxine  50 mcg Oral QAC breakfast  . metronidazole  500 mg Intravenous Q8H  . saccharomyces boulardii  250 mg Oral BID  . sodium chloride  3 mL Intravenous Q12H  . vancomycin (VANCOCIN) rectal ENEMA  500 mg Rectal 4 times per day   Continuous Infusions: . sodium chloride 250 mL (10/16/14 0119)  . dextrose 5 % and 0.45% NaCl 1,000 mL with potassium chloride 40 mEq infusion 75 mL/hr at 10/26/14 0737    Principal Problem:   C. difficile colitis Active Problems:   Chest pain   Hypothyroidism   Essential hypertension   GERD   Degenerative spondylolisthesis   Dehydration   Renal failure, acute on chronic   Leukocytosis   Pain in the chest   Abnormal EKG    Time spent: 40 mins    [facility] MD [facility] Pager [phone]. If 7PM-7AM, please contact night-coverage at [email or web address], password [email or web address] 10/26/2014, 12:48 PM  LOS: 11 days

## 2014-10-26 NOTE — Progress Notes (Signed)
  Echocardiogram 2D Echocardiogram has been performed.  Darlina Sicilian M 10/26/2014, 2:56 PM

## 2014-10-27 ENCOUNTER — Inpatient Hospital Stay (HOSPITAL_COMMUNITY): Payer: Medicare HMO

## 2014-10-27 LAB — CBC WITH DIFFERENTIAL/PLATELET
Basophils Absolute: 0 10*3/uL (ref 0.0–0.1)
Basophils Relative: 0 % (ref 0–1)
Eosinophils Absolute: 0.2 10*3/uL (ref 0.0–0.7)
Eosinophils Relative: 1 % (ref 0–5)
HCT: 34.3 % — ABNORMAL LOW (ref 36.0–46.0)
Hemoglobin: 11.6 g/dL — ABNORMAL LOW (ref 12.0–15.0)
Lymphocytes Relative: 13 % (ref 12–46)
Lymphs Abs: 2.6 10*3/uL (ref 0.7–4.0)
MCH: 29.3 pg (ref 26.0–34.0)
MCHC: 33.8 g/dL (ref 30.0–36.0)
MCV: 86.6 fL (ref 78.0–100.0)
Monocytes Absolute: 2 10*3/uL — ABNORMAL HIGH (ref 0.1–1.0)
Monocytes Relative: 10 % (ref 3–12)
Neutro Abs: 15.5 10*3/uL — ABNORMAL HIGH (ref 1.7–7.7)
Neutrophils Relative %: 76 % (ref 43–77)
Platelets: 362 10*3/uL (ref 150–400)
RBC: 3.96 MIL/uL (ref 3.87–5.11)
RDW: 15.7 % — ABNORMAL HIGH (ref 11.5–15.5)
WBC: 20.3 10*3/uL — ABNORMAL HIGH (ref 4.0–10.5)

## 2014-10-27 LAB — BASIC METABOLIC PANEL
Anion gap: 5 (ref 5–15)
BUN: 16 mg/dL (ref 6–23)
CO2: 24 mmol/L (ref 19–32)
Calcium: 7.3 mg/dL — ABNORMAL LOW (ref 8.4–10.5)
Chloride: 114 mmol/L — ABNORMAL HIGH (ref 96–112)
Creatinine, Ser: 0.98 mg/dL (ref 0.50–1.10)
GFR calc Af Amer: 66 mL/min — ABNORMAL LOW (ref >90)
GFR calc non Af Amer: 57 mL/min — ABNORMAL LOW (ref >90)
Glucose, Bld: 101 mg/dL — ABNORMAL HIGH (ref 70–99)
Potassium: 3.7 mmol/L (ref 3.5–5.1)
Sodium: 143 mmol/L (ref 135–145)

## 2014-10-27 LAB — GLUCOSE, CAPILLARY
Glucose-Capillary: 83 mg/dL (ref 70–99)
Glucose-Capillary: 84 mg/dL (ref 70–99)
Glucose-Capillary: 91 mg/dL (ref 70–99)
Glucose-Capillary: 93 mg/dL (ref 70–99)

## 2014-10-27 LAB — MAGNESIUM: Magnesium: 1.8 mg/dL (ref 1.5–2.5)

## 2014-10-27 MED ORDER — POTASSIUM CHLORIDE CRYS ER 20 MEQ PO TBCR
40.0000 meq | EXTENDED_RELEASE_TABLET | Freq: Once | ORAL | Status: AC
  Administered 2014-10-27: 40 meq via ORAL
  Filled 2014-10-27: qty 2

## 2014-10-27 MED ORDER — MAGNESIUM SULFATE 2 GM/50ML IV SOLN
2.0000 g | Freq: Once | INTRAVENOUS | Status: AC
  Administered 2014-10-27: 2 g via INTRAVENOUS
  Filled 2014-10-27: qty 50

## 2014-10-27 NOTE — Progress Notes (Signed)
Patient ID: [name], female   DOB: November 14, 1945, 69 y.o.   MRN: [id number] Vital signs are stable Patient is feeling somewhat better Abdomen is soft bowel sounds are present Patient knows the diarrhea is slowing Overall she is feeling better

## 2014-10-27 NOTE — Progress Notes (Signed)
[facility] PROGRESS NOTE/Consult  [name] ZJI:[id number] DOB: 1945/10/27 DOA: 10/15/2014 PCP: Joycelyn Man, MD  Assessment/Plan: #1 C. difficile colitis Patient was started on oral vancomycin 125 mg 4 times a day. Patient improving leukocytosis. Consulting physician discussed with ID who recommended addition of IV Flagyl and Florastor which was started yesterday. Arava and Plaquenil have been discontinued. Patient with 1 loose stools this morning. Patient also noted with concerns for ileus which is being followed by general surgery. Patient tolerating clear liquids. Patient denies any abdominal pain. Some clinical improvement. Leukocytosis trending down. Advanced to a full liquid diet for breakfast. Continue vancomycin enemas and IV Flagyl.  #2 chest discomfort/ Abn EKG Patient denies any chest discomfort today. Yesterday patient with chest discomfort with both typical and atypical features. EKG from 10/21/2014 with some ST-T wave changes. EKG from 10/24/2014 with T-wave changes. Cardiac enzymes minimally elevated and slowly trending down. 2-D echo with EF of 65-70% with no wall motion abnormalities.Continue low-dose beta blocker. Patient has been seen in consultation by cardiology who does not feel like patient has had an acute MI. No need for blood status at this time. GI cocktail when necessary. Cardiology following and appreciate input and recommendations.   #3 probable ileus Abdominal x-rays with some improvement. Patient is being followed by general surgery. Patient tolerating clear liquids. Keep potassium greater than 4. Keep magnesium greater than 2. Serial abdominal films. Will advance to full liquid diet in the morning. Follow.  #4 dehydration IV fluids.  #5 acute on chronic kidney disease stage III Precipitated by prerenal azotemia secondary to GI losses in the setting of ARB and HCTZ. Creatinine peaked at 4.74 on 10/21/2014. Patient does have a history of a solitary  kidney related to nephrectomy from cancer. Renal function has improved. Continue gentle hydration. Follow.  #6 leukocytosis Likely secondary to problem #1. Blood cultures which were drawn been negative to date. Urinalysis drawn was negative. Chest x-ray with no acute infiltrate. Leukocytosis trending down. Continue vancomycin enemas and IV Flagyl. Follow.  #7 acute encephalopathy Improved. Patient alert and oriented 3. No focal deficits. Likely related to pain medications.   #8 L4-5 grade 1 degenerative spondylolisthesis with disc herniation status post prior laminectomy Patient status post reexploration laminotomy with bilateral L4-5 decompressive laminectomy and foraminotomies followed by posterior lumbar fusion per Dr. Annette Stable 10/15/2014. Per primary team.  #9 well-controlled type 2 diabetes Hemoglobin A1c was 5.6 on 06/13/2014. Oral hypoglycemic agents on hold. CBGs have ranged from 83 -98. Sliding scale insulin.  #10 hypertension Stable. Antihypertensive medications on hold.  #11 hypokalemia Likely secondary to GI losses. Replete and keep > 4.  #12 hypothyroidism Continue Synthroid.  #13 arthritis Arava and Plaquenil have been discontinued secondary to acute infection. Follow.  #14 asthma Stable.  #15 prophylaxis Pepcid for GI prophylaxis. SCDs for DVT prophylaxis.     Code Status: Full Family Communication: Updated patient and children at bedside. Disposition Plan: Per primary team.   Consultants:  [facility]: Dr. Algis Liming 10/24/2014  Cardiology: Dr. Ron Parker 10/25/2014  Procedures:  Abdominal x-ray 10/24/2014, 10/22/2014  Chest x-ray 10/24/2014  X-ray of the lumbar spine 10/15/2014  Reexploration L4-5 laminectomy with redo bilateral complete decompressive laminectomy and radical foraminotomies both L4 and L5 nerve roots will be required for simple interbody fusion alone. L4-5 posterior lumbar interbody fusion, L4-5 posterior lateral arthodesis--- Dr.  Annette Stable 10/15/2014  Antibiotics:  IV Flagyl 10/24/2014  Oral vancomycin 10/21/2014>>> 10/25/2014  Oral Keflex 10/17/2014>>>> 10/22/2014  Vancomycin enema 10/25/2014  HPI/Subjective: Patient  states she's feeling better. Patient denies any nausea or emesis states she is tolerating clear liquids. Patient asking for her diet to be advanced.  Objective: Filed Vitals:   10/27/14 1654  BP: 146/98  Pulse: 98  Temp: 99.5 F (37.5 C)  Resp: 18    Intake/Output Summary (Last 24 hours) at 10/27/14 1842 Last data filed at 10/26/14 2123  Gross per 24 hour  Intake    240 ml  Output      0 ml  Net    240 ml   Filed Weights   10/15/14 0757 10/15/14 0827  Weight: 90.719 kg (200 lb) 90.719 kg (200 lb)    Exam:   General:  NAD  Cardiovascular: RRR  Respiratory: CTAB  Abdomen: Soft, nontender, nondistended, positive bowel sounds.  Musculoskeletal: No clubbing cyanosis or edema.  Data Reviewed: Basic Metabolic Panel:  Recent Labs Lab 10/23/14 0630 10/24/14 0959 10/25/14 0551 10/25/14 1300 10/26/14 0057 10/27/14 0455  NA 137 140 144  --  145 143  K 4.0 3.2* 3.3*  --  3.7 3.7  CL 108 109 112  --  114* 114*  CO2 19 22 22   --  28 24  GLUCOSE 92 124* 116*  --  112* 101*  BUN 67* 44* 29*  --  24* 16  CREATININE 2.17* 1.57* 1.22*  --  1.10 0.98  CALCIUM 7.6* 7.1* 7.3*  --  7.2* 7.3*  MG  --   --   --  2.9*  --  1.8   Liver Function Tests: No results for input(s): AST, ALT, ALKPHOS, BILITOT, PROT, ALBUMIN in the last 168 hours. No results for input(s): LIPASE, AMYLASE in the last 168 hours. No results for input(s): AMMONIA in the last 168 hours. CBC:  Recent Labs Lab 10/22/14 1210 10/23/14 0630 10/24/14 0959 10/25/14 0551 10/26/14 0057 10/27/14 0455  WBC 21.4* 27.5* 37.8* 40.2* 29.1* 20.3*  NEUTROABS 17.2*  --   --   --  23.6* 15.5*  HGB 12.8 12.5 11.8* 11.9* 12.1 11.6*  HCT 36.5 35.5* 34.3* 34.3* 35.2* 34.3*  MCV 84.7 84.3 86.0 85.8 84.8 86.6  PLT 262 266 316  350 340 362   Cardiac Enzymes:  Recent Labs Lab 10/25/14 1315 10/25/14 1857 10/26/14 0057  TROPONINI 0.06* 0.07* 0.04*   BNP (last 3 results) No results for input(s): BNP in the last 8760 hours.  ProBNP (last 3 results) No results for input(s): PROBNP in the last 8760 hours.  CBG:  Recent Labs Lab 10/26/14 1120 10/26/14 1635 10/26/14 2121 10/27/14 0704 10/27/14 1100  GLUCAP 101* 78 98 83 84    Recent Results (from the past 240 hour(s))  Clostridium Difficile by PCR     Status: Abnormal   Collection Time: 10/21/14  1:52 PM  Result Value Ref Range Status   C difficile by pcr POSITIVE (A) NEGATIVE Final    Comment: CRITICAL RESULT CALLED TO, READ BACK BY AND VERIFIED WITH: T. [facility] RN 15:15 10/21/14 ([email or web address])   Culture, blood (routine x 2)     Status: None (Preliminary result)   Collection Time: 10/22/14  3:50 AM  Result Value Ref Range Status   Specimen Description BLOOD RIGHT ARM  Final   Special Requests BOTTLES DRAWN AEROBIC AND ANAEROBIC 5CC  Final   Culture   Final           BLOOD CULTURE RECEIVED NO GROWTH TO DATE CULTURE WILL BE HELD FOR 5 DAYS BEFORE ISSUING A FINAL NEGATIVE REPORT Performed at [facility]    Report Status PENDING  Incomplete  Culture, blood (routine x 2)     Status: None (Preliminary result)   Collection Time: 10/22/14  3:59 AM  Result Value Ref Range Status   Specimen Description BLOOD RIGHT HAND  Final   Special Requests BOTTLES DRAWN AEROBIC AND ANAEROBIC 5CC  Final   Culture   Final           BLOOD CULTURE RECEIVED NO GROWTH TO DATE CULTURE WILL BE HELD FOR 5 DAYS BEFORE ISSUING A FINAL NEGATIVE REPORT Performed at [facility]    Report Status PENDING  Incomplete     Studies: Dg Abd 2 Views  10/27/2014   CLINICAL DATA:  Ileus  EXAM: ABDOMEN - 2 VIEW  COMPARISON:  10/26/2014  FINDINGS: Mild gaseous gastric distention is identified. L4-L5 fusion hardware noted. On the upright view, gas is noted within probable  loops of colon with wall thickness at upper limits of normal allowing for technique.  IMPRESSION: Mildly prominent gas-filled loops of colon with borderline wall thickening which is nonspecific and may be artifactual due to underdistention or could represent wall edema in the case of infectious or inflammation. No significant interval change.   Electronically Signed   By: Conchita Paris M.D.   On: 10/27/2014 10:03   Dg Abd 2 Views  10/26/2014   CLINICAL DATA:  Nausea, vomiting, diarrhea  EXAM: ABDOMEN - 2 VIEW  COMPARISON:  10/24/2014  FINDINGS: Persistent gaseous distension of the colon without significant change from prior exam. Mild distended small bowel loops in left abdomen probable mild ileus. No free abdominal air.  IMPRESSION: Persistent gaseous distension of the colon stable from prior exam. Mild distended small bowel loops in left abdomen probable mild ileus.   Electronically Signed   By: Lahoma Crocker M.D.   On: 10/26/2014 12:41    Scheduled Meds: . atorvastatin  20 mg Oral q1800  . carvedilol  3.125 mg Oral BID WC  . famotidine  20 mg Oral Daily  . fluticasone  2 spray Each Nare Daily  . levothyroxine  50 mcg Oral QAC breakfast  . metronidazole  500 mg Intravenous Q8H  . saccharomyces boulardii  250 mg Oral BID  . simethicone  160 mg Oral QID  . sodium chloride  3 mL Intravenous Q12H  . vancomycin (VANCOCIN) rectal ENEMA  500 mg Rectal 4 times per day   Continuous Infusions: . sodium chloride 250 mL (10/16/14 0119)  . dextrose 5 % and 0.45% NaCl 1,000 mL with potassium chloride 40 mEq infusion 50 mL/hr at 10/27/14 0116    Principal Problem:   C. difficile colitis Active Problems:   Chest pain   Hypothyroidism   Essential hypertension   GERD   Degenerative spondylolisthesis   Dehydration   Renal failure, acute on chronic   Leukocytosis   Pain in the chest   Abnormal EKG   Ileus    Time spent: 40 mins    [facility] MD [facility] Pager [phone]. If  7PM-7AM, please contact night-coverage at [email or web address], password [facility] 10/27/2014, 6:42 PM  LOS: 12 days

## 2014-10-28 DIAGNOSIS — E139 Other specified diabetes mellitus without complications: Secondary | ICD-10-CM

## 2014-10-28 LAB — CBC
HCT: 33.8 % — ABNORMAL LOW (ref 36.0–46.0)
Hemoglobin: 11.3 g/dL — ABNORMAL LOW (ref 12.0–15.0)
MCH: 29.3 pg (ref 26.0–34.0)
MCHC: 33.4 g/dL (ref 30.0–36.0)
MCV: 87.6 fL (ref 78.0–100.0)
Platelets: 378 10*3/uL (ref 150–400)
RBC: 3.86 MIL/uL — ABNORMAL LOW (ref 3.87–5.11)
RDW: 16 % — ABNORMAL HIGH (ref 11.5–15.5)
WBC: 15.3 10*3/uL — ABNORMAL HIGH (ref 4.0–10.5)

## 2014-10-28 LAB — BASIC METABOLIC PANEL
Anion gap: 4 — ABNORMAL LOW (ref 5–15)
BUN: 13 mg/dL (ref 6–23)
CO2: 20 mmol/L (ref 19–32)
Calcium: 7.4 mg/dL — ABNORMAL LOW (ref 8.4–10.5)
Chloride: 115 mmol/L — ABNORMAL HIGH (ref 96–112)
Creatinine, Ser: 0.93 mg/dL (ref 0.50–1.10)
GFR calc Af Amer: 71 mL/min — ABNORMAL LOW (ref >90)
GFR calc non Af Amer: 61 mL/min — ABNORMAL LOW (ref >90)
Glucose, Bld: 107 mg/dL — ABNORMAL HIGH (ref 70–99)
Potassium: 3.9 mmol/L (ref 3.5–5.1)
Sodium: 139 mmol/L (ref 135–145)

## 2014-10-28 LAB — GLUCOSE, CAPILLARY
Glucose-Capillary: 96 mg/dL (ref 70–99)
Glucose-Capillary: 114 mg/dL — ABNORMAL HIGH (ref 70–99)
Glucose-Capillary: 101 mg/dL — ABNORMAL HIGH (ref 70–99)
Glucose-Capillary: 98 mg/dL (ref 70–99)

## 2014-10-28 LAB — CULTURE, BLOOD (ROUTINE X 2)
Culture: NO GROWTH
Culture: NO GROWTH

## 2014-10-28 LAB — MAGNESIUM: Magnesium: 1.6 mg/dL (ref 1.5–2.5)

## 2014-10-28 MED ORDER — MAGNESIUM SULFATE 4 GM/100ML IV SOLN
4.0000 g | Freq: Once | INTRAVENOUS | Status: AC
  Administered 2014-10-28: 4 g via INTRAVENOUS
  Filled 2014-10-28: qty 100

## 2014-10-28 MED ORDER — VANCOMYCIN 50 MG/ML ORAL SOLUTION
250.0000 mg | Freq: Four times a day (QID) | ORAL | Status: DC
  Administered 2014-10-29 (×2): 250 mg via ORAL
  Filled 2014-10-28 (×5): qty 5

## 2014-10-28 MED ORDER — METRONIDAZOLE 500 MG PO TABS
500.0000 mg | ORAL_TABLET | Freq: Three times a day (TID) | ORAL | Status: DC
  Administered 2014-10-29 (×2): 500 mg via ORAL
  Filled 2014-10-28 (×2): qty 1

## 2014-10-28 NOTE — Progress Notes (Signed)
Physical Therapy Treatment Patient Details Name: [name] MRN: [id number] DOB: 07-23-1946 Today's Date: 10/28/2014    History of Present Illness s/p posterior lumber fusion 1 level; PMH: TKR, OA, RA, back surgery in 2014,Chronic kidney disease     PT Comments    Patient able to walk a little bit more today. She appears to feel better but still very weak and fatigues quickly. Patient still not on solid food since last week. Daughter present this session and stated that MD said he will hopefully DC her sometime this week. Continue with current POC and HHPT follow up  Follow Up Recommendations  Home health PT;Supervision/Assistance - 24 hour     Equipment Recommendations  None recommended by PT    Recommendations for Other Services       Precautions / Restrictions Precautions Precautions: Back;Fall Precaution Comments: reviewed with patient Required Braces or Orthoses: Spinal Brace Spinal Brace: Lumbar corset;Applied in sitting position    Mobility  Bed Mobility Overal bed mobility: Needs Assistance     Sidelying to sit: Supervision     Sit to sidelying: Supervision General bed mobility comments: Using bed rails  Transfers Overall transfer level: Needs assistance Equipment used: Rolling walker (2 wheeled)   Sit to Stand: Supervision         General transfer comment: Cues for safety with RW and hand placement  Ambulation/Gait Ambulation/Gait assistance: Min guard Ambulation Distance (Feet): 40 Feet Assistive device: Rolling walker (2 wheeled) Gait Pattern/deviations: Step-through pattern;Decreased stride length Gait velocity: decreased   General Gait Details: Patient unsafe when fatigued. Cues for safe use of walker and to maintain precautinos   Stairs            Wheelchair Mobility    Modified Rankin (Stroke Patients Only)       Balance                                    Cognition Arousal/Alertness: Awake/alert Behavior  During Therapy: WFL for tasks assessed/performed Overall Cognitive Status: Within Functional Limits for tasks assessed                      Exercises      General Comments        Pertinent Vitals/Pain Pain Score: 6  Pain Location: back/abdomen Pain Descriptors / Indicators: Sore Pain Intervention(s): Monitored during session    Home Living                      Prior Function            PT Goals (current goals can now be found in the care plan section) Progress towards PT goals: Progressing toward goals    Frequency  Min 5X/week    PT Plan Current plan remains appropriate    Co-evaluation             End of Session Equipment Utilized During Treatment: Back brace Activity Tolerance: Patient limited by fatigue Patient left: in bed;with call bell/phone within reach     Time: 1357-1415 PT Time Calculation (min) (ACUTE ONLY): 18 min  Charges:  $Gait Training: 8-22 mins                    G Codes:      Jacqualyn Posey 10/28/2014, 3:06 PM 10/28/2014 Jacqualyn Posey PTA [phone] pager [phone] office

## 2014-10-28 NOTE — Progress Notes (Signed)
No issues overnight. Pt reports improving diarrhea. No pain. No chest pain. Overall she says she is feeling much better over the last few days. Tolerating diet.  EXAM:  BP 103/69 mmHg  Pulse 89  Temp(Src) 97.3 F (36.3 C) (Oral)  Resp 18  Ht 5\' 6"  (1.676 m)  Wt 90.719 kg (200 lb)  BMI 32.30 kg/m2  SpO2 100%  Awake, alert, oriented  Speech fluent, appropriate  CN grossly intact  5/5 BUE/BLE   IMPRESSION:  69 y.o. female with c diff colitis after lumbar fusion, appears to be improving. - chest pain has resolved, will likely need outpatient cardiology f/u - Renal function is improved  PLAN: - Pt appears to be progressing towards discharge in the near future.

## 2014-10-28 NOTE — Progress Notes (Signed)
CARE MANAGEMENT NOTE 10/28/2014  Patient:  [name], [name]   Account Number:  [id number]  Date Initiated:  10/16/2014  Documentation initiated by:  Olga Coaster  Subjective/Objective Assessment:   ADMITTED FOR SURGERY     Action/Plan:   CM FOLLOWING FOR DCP   Anticipated DC Date:  10/21/2014   Anticipated DC Plan:  [place]  CM consult      Choice offered to / List presented to:             Status of service:  In process, will continue to follow Medicare Important Message given?  YES (If response is "NO", the following Medicare IM given date fields will be blank) Date Medicare IM given:  10/21/2014 Medicare IM given by:  Olga Coaster Date Additional Medicare IM given:  10/28/2014 Additional Medicare IM given by:  [facility] SWIST  Discharge Disposition:    Per UR Regulation:  Reviewed for med. necessity/level of care/duration of stay  If discussed at [place] of Stay Meetings, dates discussed:    Comments:  10/28/14 IM letter given Carles Collet RN BSN CM  10/21/2014- TO BE DISCHARGED HOME SOON; BARRIER IS LOW BP; B CHANDLER RN,BSN,MHA [phone]  10/16/2014- B CHANDLER RN,BSN,MHA [phone]

## 2014-10-28 NOTE — Progress Notes (Signed)
Occupational Therapy Treatment Patient Details Name: [name] MRN: [id number] DOB: May 20, 1946 Today's Date: 10/28/2014    History of present illness s/p posterior lumber fusion 1 level; PMH: TKR, OA, RA, back surgery in 2014,Chronic kidney disease    OT comments  Patient progressing towards OT goals, but with continued decreased fatigue during therapy sessions. Patient will benefit from additional [place] post acute discharge to aid in increasing patient's overall independence, activity tolerance/endurance, and quality of life. Patient states her daughter will be able to provide 24/7 supervision/assistance post discharge.    Follow Up Recommendations  Home health OT;Supervision/Assistance - 24 hour    Equipment Recommendations  3 in 1 bedside comode    Recommendations for Other Services  None at this time   Precautions / Restrictions Precautions Precautions: Back;Fall Precaution Booklet Issued: No Precaution Comments: Reviewed back precautions with patient, patient only able to state 1 precaution when asked Required Braces or Orthoses: Spinal Brace Spinal Brace: Lumbar corset;Applied in sitting position Restrictions Weight Bearing Restrictions: No       Mobility Bed Mobility Overal bed mobility: Needs Assistance Bed Mobility: Sidelying to Sit   Sidelying to sit: Supervision  General bed mobility comments: Using bed rails  Transfers Overall transfer level: Needs assistance Equipment used: Rolling walker (2 wheeled) Transfers: Sit to/from Stand Sit to Stand: Supervision    General transfer comment: Cues for safety with RW and hand placement    Balance Overall balance assessment: Needs assistance Sitting-balance support: No upper extremity supported;Feet supported Sitting balance-Leahy Scale: Good     Standing balance support: Bilateral upper extremity supported;During functional activity Standing balance-Leahy Scale: Fair    ADL General ADL Comments: Patient  continues to be limited by fatigue. Patient engaged in bed mobility and sat EOB for donning of socks and lumbar corset. Patient required mod verbal cues and min assist for donning of lumbar corset. Patient required assistance with donning of both socks. Patient unable to safely reach LEs, even with crossing, secondary to patient still tends to break back precautions. Discussed use of sock aid for donning of socks.and reacher for assistance as well. Patient able to transfer into recliner and requested to eat breakfast.      Cognition   Behavior During Therapy: [facility] for tasks assessed/performed Overall Cognitive Status: Within Functional Limits for tasks assessed                Pertinent Vitals/ Pain       Pain Assessment: No/denies pain ("my back doesn't bother me")         Frequency Min 2X/week     Progress Toward Goals  OT Goals(current goals can now be found in the care plan section)  Progress towards OT goals: Progressing toward goals   Plan Discharge plan needs to be updated       End of Session Equipment Utilized During Treatment: Rolling walker;Back brace   Activity Tolerance Patient limited by fatigue   Patient Left in chair;with call bell/phone within reach     Time: 5732-2025 OT Time Calculation (min): 21 min  Charges: OT General Charges $OT Visit: 1 Procedure OT Treatments $Self Care/Home Management : 8-22 mins  Khristian Seals , MS, OTR/L, CLT Pager: [phone]  10/28/2014, 9:26 AM

## 2014-10-28 NOTE — Progress Notes (Signed)
SUBJECTIVE: The patient is getting stronger. She has not had any recurrent chest pain. Etiology of the pain episode that she had on March 25 is not clear.   Filed Vitals:   10/27/14 2159 10/28/14 0229 10/28/14 0613 10/28/14 0951  BP: 148/85 138/73 99/72 103/69  Pulse: 94 90 100 89  Temp: 98.8 F (37.1 C) 98.7 F (37.1 C) 99.1 F (37.3 C) 98.8 F (37.1 C)  TempSrc: Oral Oral Oral Oral  Resp: 19 18 18 18   Height:      Weight:      SpO2: 99% 99% 100% 100%    No intake or output data in the 24 hours ending 10/28/14 1029  LABS: Basic Metabolic Panel:  Recent Labs  10/27/14 0455 10/28/14 0825  NA 143 139  K 3.7 3.9  CL 114* 115*  CO2 24 20  GLUCOSE 101* 107*  BUN 16 13  CREATININE 0.98 0.93  CALCIUM 7.3* 7.4*  MG 1.8 1.6   Liver Function Tests: No results for input(s): AST, ALT, ALKPHOS, BILITOT, PROT, ALBUMIN in the last 72 hours. No results for input(s): LIPASE, AMYLASE in the last 72 hours. CBC:  Recent Labs  10/26/14 0057 10/27/14 0455 10/28/14 0825  WBC 29.1* 20.3* 15.3*  NEUTROABS 23.6* 15.5*  --   HGB 12.1 11.6* 11.3*  HCT 35.2* 34.3* 33.8*  MCV 84.8 86.6 87.6  PLT 340 362 378   Cardiac Enzymes:  Recent Labs  10/25/14 1315 10/25/14 1857 10/26/14 0057  TROPONINI 0.06* 0.07* 0.04*   BNP: Invalid input(s): POCBNP D-Dimer: No results for input(s): DDIMER in the last 72 hours. Hemoglobin A1C: No results for input(s): HGBA1C in the last 72 hours. Fasting Lipid Panel: No results for input(s): CHOL, HDL, LDLCALC, TRIG, CHOLHDL, LDLDIRECT in the last 72 hours. Thyroid Function Tests: No results for input(s): TSH, T4TOTAL, T3FREE, THYROIDAB in the last 72 hours.  Invalid input(s): FREET3  RADIOLOGY: Dg Lumbar Spine 2-3 Views  10/15/2014   CLINICAL DATA:  Fusion of the lumbar spine  EXAM: LUMBAR SPINE - 2-3 VIEW; DG C-ARM 61-120 MIN ; fluoro time reported is 0 minutes, 22 seconds.  COMPARISON:  MRI of the lumbar spine of September 25, 2014   FINDINGS: Two fluorospot films are submitted. The patient has undergone placement of pedicle screws at L4 and L5. An intradiscal device has been placed at the L4-5 level as well.  IMPRESSION: Intraoperative images revealing posterior fusion and interdiscal device placement at L4-5 without evidence of immediate complication.   Electronically Signed   By: David  [name]   On: 10/15/2014 13:26   Dg Abd 1 View  10/22/2014   CLINICAL DATA:  Abdominal pain  EXAM: ABDOMEN - 1 VIEW  COMPARISON:  None.  FINDINGS: Supine radiographic evaluation of the abdomen shows scattered large and small bowel gas. Distension of the colon is noted although this does not appear to be obstructive in nature. Postsurgical changes in the lower lumbar spine are seen. No free air is noted.  IMPRESSION: Mild gaseous distension of the stomach. No definitive obstructive changes are seen. Followup imaging is recommended.   Electronically Signed   By: Inez Catalina M.D.   On: 10/22/2014 16:23   Dg Chest Port 1 View  10/24/2014   CLINICAL DATA:  Cough  EXAM: PORTABLE CHEST - 1 VIEW  COMPARISON:  March 13, 2014  FINDINGS: There is patchy bibasilar atelectatic change. Lungs elsewhere clear. Heart is upper normal in size with pulmonary vascularity within normal limits. No  adenopathy. No bone lesions.  IMPRESSION: Patchy bibasilar atelectasis.  No edema or consolidation.   Electronically Signed   By: Lowella Grip III M.D.   On: 10/24/2014 10:02   Dg Abd 2 Views  10/27/2014   CLINICAL DATA:  Ileus  EXAM: ABDOMEN - 2 VIEW  COMPARISON:  10/26/2014  FINDINGS: Mild gaseous gastric distention is identified. L4-L5 fusion hardware noted. On the upright view, gas is noted within probable loops of colon with wall thickness at upper limits of normal allowing for technique.  IMPRESSION: Mildly prominent gas-filled loops of colon with borderline wall thickening which is nonspecific and may be artifactual due to underdistention or could represent wall edema  in the case of infectious or inflammation. No significant interval change.   Electronically Signed   By: Conchita Paris M.D.   On: 10/27/2014 10:03   Dg Abd 2 Views  10/26/2014   CLINICAL DATA:  Nausea, vomiting, diarrhea  EXAM: ABDOMEN - 2 VIEW  COMPARISON:  10/24/2014  FINDINGS: Persistent gaseous distension of the colon without significant change from prior exam. Mild distended small bowel loops in left abdomen probable mild ileus. No free abdominal air.  IMPRESSION: Persistent gaseous distension of the colon stable from prior exam. Mild distended small bowel loops in left abdomen probable mild ileus.   Electronically Signed   By: Lahoma Crocker M.D.   On: 10/26/2014 12:41   Dg Abd 2 Views  10/24/2014   CLINICAL DATA:  Abdominal pain for 10 days, followup ileus  EXAM: ABDOMEN - 2 VIEW  COMPARISON:  Exam at 1441 hours compared to 10/24/2014 at 0937 hours  FINDINGS: Persisting gaseous distention of colon similar to prior exam.  No definite bowel wall thickening or free intraperitoneal air.  No small bowel dilatation.  Prior L4-L5 fusion.  No acute osseous findings or urinary tract calcification.  IMPRESSION: Persisting gaseous distention of colon without evidence of perforation.   Electronically Signed   By: Lavonia Dana M.D.   On: 10/24/2014 16:43   Dg Abd Portable 1v  10/24/2014   CLINICAL DATA:  69 year old female with cough and abdominal pain. Suspicion of ileus. Initial encounter.  EXAM: PORTABLE ABDOMEN - 1 VIEW  COMPARISON:  10/22/2014 and earlier.  FINDINGS: Portable AP supine view at 0937 hours. Moderate gaseous distension of the transverse and left colon re - identified and stable. No dilated small bowel loops. Less of the left abdomen is visible today. Stable visualized osseous structures. No definite pneumoperitoneum on this supine view.  At the level of the left pubic rami there is suggestion of bowel gas (arrow).  IMPRESSION: Continued gaseous distension of large bowel without small bowel  dilatation favors ileus.  However I note there may be left inguinal region bowel gas. As such, consider a left inguinal hernia - and if there is a palpable hernia consider a distal bowel obstruction.   Electronically Signed   By: Genevie Ann M.D.   On: 10/24/2014 09:58   Dg C-arm 1-60 Min  10/15/2014   CLINICAL DATA:  Fusion of the lumbar spine  EXAM: LUMBAR SPINE - 2-3 VIEW; DG C-ARM 61-120 MIN ; fluoro time reported is 0 minutes, 22 seconds.  COMPARISON:  MRI of the lumbar spine of September 25, 2014  FINDINGS: Two fluorospot films are submitted. The patient has undergone placement of pedicle screws at L4 and L5. An intradiscal device has been placed at the L4-5 level as well.  IMPRESSION: Intraoperative images revealing posterior fusion and interdiscal device placement  at L4-5 without evidence of immediate complication.   Electronically Signed   By: David  [name]   On: 10/15/2014 13:26    PHYSICAL EXAM  patient is stable. She is oriented to person time and place. Affect is normal. Lungs reveal a few scattered rhonchi. Cardiac exam reveals S1 and S2. There is no significant peripheral edema.   TELEMETRY: Patient is not on telemetry at this time.   ASSESSMENT AND PLAN:    C. difficile colitis   This is improving and being treated.      Degenerative spondylolisthesis     She is status post surgery this admission.    Pain in the chest   Abnormal EKG     Etiology of the event on October 25, 2014 remains unclear. I have ordered a repeat EKG for follow-up. Her echo was technically difficult. However LV function appears to be normal. Most likely it will be appropriate for her to have outpatient cardiac follow-up after discharge with the possibility of an exercise test in the future.  Dola Argyle 10/28/2014 10:29 AM

## 2014-10-28 NOTE — Progress Notes (Signed)
[facility] PROGRESS NOTE/Consult  PHILLIS THACKERAY ZDG:[id number] DOB: 1946-01-25 DOA: 10/15/2014 PCP: Joycelyn Man, MD  Assessment/Plan: #1 C. difficile colitis Patient was started on oral vancomycin 125 mg 4 times a day. Patient improving leukocytosis. Consulting physician discussed with ID who recommended addition of IV Flagyl and Florastor which was started yesterday. Arava and Plaquenil have been discontinued. Patient with 1 loose stools this morning. Patient also noted with concerns for ileus which is being followed by general surgery. Patient tolerating clear liquids. Patient denies any abdominal pain. Some clinical improvement. Leukocytosis trending down. Advanced to a soft diet. for breakfast. Continue vancomycin enemas and IV Flagyl. Change to oral vancomycin and oral Flagyl in the morning. Patient will likely need a total of 10-14 days of antibiotic treatment.  #2 chest discomfort/ Abn EKG Patient denies any chest discomfort today. Yesterday patient with chest discomfort with both typical and atypical features. EKG from 10/21/2014 with some ST-T wave changes. EKG from 10/24/2014 with T-wave changes. Cardiac enzymes minimally elevated and slowly trending down. 2-D echo with EF of 65-70% with no wall motion abnormalities.Continue low-dose beta blocker. Patient has been seen in consultation by cardiology who does not feel like patient has had an acute MI. GI cocktail when necessary. Patient currently stable. Per cardiology patient will follow-up as outpatient for possible outpatient stress test. Cardiology following and appreciate input and recommendations.   #3 probable ileus Abdominal x-rays with some improvement. Patient is being followed by general surgery. Patient tolerating full liquids. Keep potassium greater than 4. Keep magnesium greater than 2. Serial abdominal films. Will advance to soft diet. Follow.  #4 dehydration Saline lock IV fluids.   #5 acute on chronic kidney  disease stage III Precipitated by prerenal azotemia secondary to GI losses in the setting of ARB and HCTZ. Creatinine peaked at 4.74 on 10/21/2014. Patient does have a history of a solitary kidney related to nephrectomy from cancer. Renal function has improved. Saline lock IV fluids. Follow.  #6 leukocytosis Likely secondary to problem #1. Blood cultures which were drawn been negative to date. Urinalysis drawn was negative. Chest x-ray with no acute infiltrate. Leukocytosis trending down. Continue vancomycin enemas and IV Flagyl. Follow.  #7 acute encephalopathy Improved. Patient alert and oriented 3. No focal deficits. Likely related to pain medications.   #8 L4-5 grade 1 degenerative spondylolisthesis with disc herniation status post prior laminectomy Patient status post reexploration laminotomy with bilateral L4-5 decompressive laminectomy and foraminotomies followed by posterior lumbar fusion per Dr. Annette Stable 10/15/2014. Per primary team.  #9 well-controlled type 2 diabetes Hemoglobin A1c was 5.6 on 06/13/2014. Oral hypoglycemic agents on hold. CBGs have ranged from 93 -114. Sliding scale insulin.  #10 hypertension Stable. Antihypertensive medications on hold.  #11 hypokalemia Likely secondary to GI losses. Replete and keep > 4.  #12 hypothyroidism Continue Synthroid.  #13 arthritis Arava and Plaquenil have been discontinued secondary to acute infection. Follow.  #14 asthma Stable.  #15 prophylaxis Pepcid for GI prophylaxis. SCDs for DVT prophylaxis.     Code Status: Full Family Communication: Updated patient and children at bedside. Disposition Plan: If continued improvement could possibly be discharged in the next 1-2 days. Per primary team.   Consultants:  [facility]: Dr. Algis Liming 10/24/2014  Cardiology: Dr. Ron Parker 10/25/2014  Procedures:  Abdominal x-ray 10/24/2014, 10/22/2014  Chest x-ray 10/24/2014  X-ray of the lumbar spine  10/15/2014  Reexploration L4-5 laminectomy with redo bilateral complete decompressive laminectomy and radical foraminotomies both L4 and L5 nerve roots will be required  for simple interbody fusion alone. L4-5 posterior lumbar interbody fusion, L4-5 posterior lateral arthodesis--- Dr. Annette Stable 10/15/2014  Antibiotics:  IV Flagyl 10/24/2014  Oral vancomycin 10/21/2014>>> 10/25/2014  Oral Keflex 10/17/2014>>>> 10/22/2014  Vancomycin enema 10/25/2014>>>> 10/28/2014  Oral vancomycin 10/29/14  Oral flagyl 10/29/14  HPI/Subjective: Patient states she's feeling better. Patient denies any nausea or emesis states she is tolerating clear liquids. Patient tolerating full liquid diet.   Objective: Filed Vitals:   10/28/14 1741  BP: 132/63  Pulse: 94  Temp: 98.4 F (36.9 C)  Resp: 20   No intake or output data in the 24 hours ending 10/28/14 1802 Filed Weights   10/15/14 0757 10/15/14 0827  Weight: 90.719 kg (200 lb) 90.719 kg (200 lb)    Exam:   General:  NAD  Cardiovascular: RRR  Respiratory: CTAB  Abdomen: Soft, nontender, nondistended, positive bowel sounds.  Musculoskeletal: No clubbing cyanosis or edema.  Data Reviewed: Basic Metabolic Panel:  Recent Labs Lab 10/24/14 0959 10/25/14 0551 10/25/14 1300 10/26/14 0057 10/27/14 0455 10/28/14 0825  NA 140 144  --  145 143 139  K 3.2* 3.3*  --  3.7 3.7 3.9  CL 109 112  --  114* 114* 115*  CO2 22 22  --  28 24 20   GLUCOSE 124* 116*  --  112* 101* 107*  BUN 44* 29*  --  24* 16 13  CREATININE 1.57* 1.22*  --  1.10 0.98 0.93  CALCIUM 7.1* 7.3*  --  7.2* 7.3* 7.4*  MG  --   --  2.9*  --  1.8 1.6   Liver Function Tests: No results for input(s): AST, ALT, ALKPHOS, BILITOT, PROT, ALBUMIN in the last 168 hours. No results for input(s): LIPASE, AMYLASE in the last 168 hours. No results for input(s): AMMONIA in the last 168 hours. CBC:  Recent Labs Lab 10/22/14 1210  10/24/14 0959 10/25/14 0551 10/26/14 0057  10/27/14 0455 10/28/14 0825  WBC 21.4*  < > 37.8* 40.2* 29.1* 20.3* 15.3*  NEUTROABS 17.2*  --   --   --  23.6* 15.5*  --   HGB 12.8  < > 11.8* 11.9* 12.1 11.6* 11.3*  HCT 36.5  < > 34.3* 34.3* 35.2* 34.3* 33.8*  MCV 84.7  < > 86.0 85.8 84.8 86.6 87.6  PLT 262  < > 316 350 340 362 378  < > = values in this interval not displayed. Cardiac Enzymes:  Recent Labs Lab 10/25/14 1315 10/25/14 1857 10/26/14 0057  TROPONINI 0.06* 0.07* 0.04*   BNP (last 3 results) No results for input(s): BNP in the last 8760 hours.  ProBNP (last 3 results) No results for input(s): PROBNP in the last 8760 hours.  CBG:  Recent Labs Lab 10/27/14 1622 10/27/14 2128 10/28/14 0655 10/28/14 1223 10/28/14 1658  GLUCAP 91 93 96 114* 101*    Recent Results (from the past 240 hour(s))  Clostridium Difficile by PCR     Status: Abnormal   Collection Time: 10/21/14  1:52 PM  Result Value Ref Range Status   C difficile by pcr POSITIVE (A) NEGATIVE Final    Comment: CRITICAL RESULT CALLED TO, READ BACK BY AND VERIFIED WITH: [facility] RN 15:15 10/21/14 ([email or web address])   Culture, blood (routine x 2)     Status: None   Collection Time: 10/22/14  3:50 AM  Result Value Ref Range Status   Specimen Description BLOOD RIGHT ARM  Final   Special Requests BOTTLES DRAWN AEROBIC AND ANAEROBIC 5CC  Final  Culture   Final    NO GROWTH 5 DAYS Performed at [facility]    Report Status 10/28/2014 FINAL  Final  Culture, blood (routine x 2)     Status: None   Collection Time: 10/22/14  3:59 AM  Result Value Ref Range Status   Specimen Description BLOOD RIGHT HAND  Final   Special Requests BOTTLES DRAWN AEROBIC AND ANAEROBIC 5CC  Final   Culture   Final    NO GROWTH 5 DAYS Performed at [facility]    Report Status 10/28/2014 FINAL  Final     Studies: Dg Abd 2 Views  10/27/2014   CLINICAL DATA:  Ileus  EXAM: ABDOMEN - 2 VIEW  COMPARISON:  10/26/2014  FINDINGS: Mild gaseous gastric distention is  identified. L4-L5 fusion hardware noted. On the upright view, gas is noted within probable loops of colon with wall thickness at upper limits of normal allowing for technique.  IMPRESSION: Mildly prominent gas-filled loops of colon with borderline wall thickening which is nonspecific and may be artifactual due to underdistention or could represent wall edema in the case of infectious or inflammation. No significant interval change.   Electronically Signed   By: Conchita Paris M.D.   On: 10/27/2014 10:03    Scheduled Meds: . atorvastatin  20 mg Oral q1800  . famotidine  20 mg Oral Daily  . fluticasone  2 spray Each Nare Daily  . levothyroxine  50 mcg Oral QAC breakfast  . metronidazole  500 mg Intravenous Q8H  . saccharomyces boulardii  250 mg Oral BID  . simethicone  160 mg Oral QID  . sodium chloride  3 mL Intravenous Q12H  . vancomycin (VANCOCIN) rectal ENEMA  500 mg Rectal 4 times per day   Continuous Infusions: . sodium chloride 250 mL (10/16/14 0119)  . dextrose 5 % and 0.45% NaCl 1,000 mL with potassium chloride 40 mEq infusion 50 mL/hr at 10/28/14 1738    Principal Problem:   C. difficile colitis Active Problems:   Chest pain   Hypothyroidism   Essential hypertension   GERD   Degenerative spondylolisthesis   Dehydration   Renal failure, acute on chronic   Leukocytosis   Pain in the chest   Abnormal EKG   Ileus    Time spent: 40 mins    [facility] MD [facility] Pager [phone]. If 7PM-7AM, please contact night-coverage at [email or web address], password [facility] 10/28/2014, 6:02 PM  LOS: 13 days

## 2014-10-29 LAB — GLUCOSE, CAPILLARY
Glucose-Capillary: 100 mg/dL — ABNORMAL HIGH (ref 70–99)
Glucose-Capillary: 113 mg/dL — ABNORMAL HIGH (ref 70–99)
Glucose-Capillary: 89 mg/dL (ref 70–99)

## 2014-10-29 LAB — BASIC METABOLIC PANEL
Anion gap: 5 (ref 5–15)
BUN: 13 mg/dL (ref 6–23)
CO2: 23 mmol/L (ref 19–32)
Calcium: 7.2 mg/dL — ABNORMAL LOW (ref 8.4–10.5)
Chloride: 112 mmol/L (ref 96–112)
Creatinine, Ser: 0.95 mg/dL (ref 0.50–1.10)
GFR calc Af Amer: 69 mL/min — ABNORMAL LOW (ref >90)
GFR calc non Af Amer: 59 mL/min — ABNORMAL LOW (ref >90)
Glucose, Bld: 94 mg/dL (ref 70–99)
Potassium: 3.7 mmol/L (ref 3.5–5.1)
Sodium: 140 mmol/L (ref 135–145)

## 2014-10-29 LAB — MAGNESIUM: Magnesium: 1.9 mg/dL (ref 1.5–2.5)

## 2014-10-29 LAB — CBC
HCT: 33.1 % — ABNORMAL LOW (ref 36.0–46.0)
Hemoglobin: 11.3 g/dL — ABNORMAL LOW (ref 12.0–15.0)
MCH: 29.4 pg (ref 26.0–34.0)
MCHC: 34.1 g/dL (ref 30.0–36.0)
MCV: 86.2 fL (ref 78.0–100.0)
Platelets: 383 10*3/uL (ref 150–400)
RBC: 3.84 MIL/uL — ABNORMAL LOW (ref 3.87–5.11)
RDW: 16.1 % — ABNORMAL HIGH (ref 11.5–15.5)
WBC: 12.7 10*3/uL — ABNORMAL HIGH (ref 4.0–10.5)

## 2014-10-29 MED ORDER — OXYCODONE-ACETAMINOPHEN 5-325 MG PO TABS: 1.0000 | ORAL_TABLET | ORAL | Status: DC | PRN

## 2014-10-29 MED ORDER — FAMOTIDINE 20 MG PO TABS: 20.0000 mg | ORAL_TABLET | Freq: Every day | ORAL | Status: DC

## 2014-10-29 MED ORDER — LEFLUNOMIDE 10 MG PO TABS: 20.0000 mg | ORAL_TABLET | Freq: Every day | ORAL | Status: DC

## 2014-10-29 MED ORDER — DIAZEPAM 5 MG PO TABS: 5.0000 mg | ORAL_TABLET | Freq: Four times a day (QID) | ORAL | Status: DC | PRN

## 2014-10-29 MED ORDER — CARVEDILOL 3.125 MG PO TABS
3.1250 mg | ORAL_TABLET | Freq: Two times a day (BID) | ORAL | Status: DC
Start: 2014-10-29 — End: 2014-12-23

## 2014-10-29 MED ORDER — HYDROXYCHLOROQUINE SULFATE 200 MG PO TABS: 400.0000 mg | ORAL_TABLET | Freq: Every day | ORAL | Status: DC

## 2014-10-29 MED ORDER — METRONIDAZOLE 500 MG PO TABS: 500.0000 mg | ORAL_TABLET | Freq: Three times a day (TID) | ORAL | Status: AC

## 2014-10-29 MED ORDER — VANCOMYCIN 50 MG/ML ORAL SOLUTION: 250.0000 mg | Freq: Four times a day (QID) | ORAL | Status: DC

## 2014-10-29 NOTE — Progress Notes (Signed)
CARE MANAGEMENT NOTE 10/29/2014  Patient:  [name], [name]   Account Number:  [id number]  Date Initiated:  10/16/2014  Documentation initiated by:  Olga Coaster  Subjective/Objective Assessment:   ADMITTED FOR SURGERY     Action/Plan:   CM FOLLOWING FOR DCP   Anticipated DC Date:  10/21/2014   Anticipated DC Plan:  [place]  CM consult      Choice offered to / List presented to:             Status of service:  In process, will continue to follow Medicare Important Message given?  YES (If response is "NO", the following Medicare IM given date fields will be blank) Date Medicare IM given:  10/21/2014 Medicare IM given by:  Olga Coaster Date Additional Medicare IM given:  10/29/2014 Additional Medicare IM given by:  [facility] SWIST  Discharge Disposition:    Per UR Regulation:  Reviewed for med. necessity/level of care/duration of stay  If discussed at [place] of Stay Meetings, dates discussed:    Comments:  10/29/14 Spoke with Jeannetta Nap at [facility]. Notified of orders for PT OT and HHA. Pt chose advanced after options given for all medicare accepting [facility] companies. Carles Collet RN BSN CM  10/29/14 IM letter given Carles Collet RN BSN CM  10/21/2014- TO BE DISCHARGED HOME SOON; BARRIER IS LOW BP; B CHANDLER RN,BSN,MHA [phone]  3/16/2016Jacinto Reap CHANDLER RN,BSN,MHA [phone]

## 2014-10-29 NOTE — Progress Notes (Signed)
[facility] PROGRESS NOTE/Consult  ALINAH SHEARD XQJ:[id number] DOB: [birth year] DOA: 10/15/2014 PCP: Joycelyn Man, MD  Assessment/Plan: #1 C. difficile colitis Patient was started on oral vancomycin 125 mg 4 times a day. Patient improving leukocytosis. Consulting physician discussed with ID who recommended addition of IV Flagyl and Florastor which was started yesterday. Arava and Plaquenil have been discontinued. Patient with 1 loose stools this morning. Patient also noted with concerns for ileus which is being followed by general surgery. Patient tolerating clear liquids. Patient denies any abdominal pain. Some clinical improvement. Leukocytosis trending down. Advanced to a full liquid diet for breakfast. Continue oral vancomycin and Flagyl.  #2 chest discomfort/ Abn EKG Patient denies any chest discomfort today. Yesterday patient with chest discomfort with both typical and atypical features. EKG from 10/21/2014 with some ST-T wave changes. EKG from 10/24/2014 with T-wave changes. Cardiac enzymes minimally elevated and slowly trending down. 2-D echo with EF of 65-70% with no wall motion abnormalities.Continue low-dose beta blocker. Patient has been seen in consultation by cardiology who does not feel like patient has had an acute MI. No need for blood status at this time. GI cocktail when necessary. Cardiology following and appreciate input and recommendations. Outpatient f/u.  #3 probable ileus Abdominal x-rays with some improvement. Patient is being followed by general surgery. Patient tolerating clear liquids. Keep potassium greater than 4. Keep magnesium greater than 2. Serial abdominal films. Tolerating soft diet.   #4 dehydration Repleted.  #5 acute on chronic kidney disease stage III Precipitated by prerenal azotemia secondary to GI losses in the setting of ARB and HCTZ. Creatinine peaked at 4.74 on 10/21/2014. Patient does have a history of a solitary kidney related to  nephrectomy from cancer. Renal function has improved. Continue gentle hydration. Follow.  #6 leukocytosis Likely secondary to problem #1. Blood cultures which were drawn been negative to date. Urinalysis drawn was negative. Chest x-ray with no acute infiltrate. Leukocytosis trending down. Continue antibiotics.  #7 acute encephalopathy Improved. Patient alert and oriented 3. No focal deficits. Likely related to pain medications.   #8 L4-5 grade 1 degenerative spondylolisthesis with disc herniation status post prior laminectomy Patient status post reexploration laminotomy with bilateral L4-5 decompressive laminectomy and foraminotomies followed by posterior lumbar fusion per Dr. Annette Stable 10/15/2014. Per primary team.  #9 well-controlled type 2 diabetes Hemoglobin A1c was 5.6 on 06/13/2014. Oral hypoglycemic agents on hold. CBGs have ranged from 83 -98. Sliding scale insulin.Resume on D/C.   #10 hypertension Stable. Antihypertensive medications on hold.Will place on coreg as out patient.  #11 hypokalemia Likely secondary to GI losses. Repleted.  #12 hypothyroidism Continue Synthroid.  #13 arthritis Arava and Plaquenil have been discontinued secondary to acute infection, and will be resumed a few days after discharge. Follow.  #14 asthma Stable.  #15 prophylaxis Pepcid for GI prophylaxis. SCDs for DVT prophylaxis.     Code Status: Full Family Communication: Updated patient and children at bedside. Disposition Plan: stable from medical stand point for discharge.   Consultants:  [facility]: Dr. Algis Liming 10/24/2014  Cardiology: Dr. Ron Parker 10/25/2014  Procedures:  Abdominal x-ray 10/24/2014, 10/22/2014  Chest x-ray 10/24/2014  X-ray of the lumbar spine 10/15/2014  Reexploration L4-5 laminectomy with redo bilateral complete decompressive laminectomy and radical foraminotomies both L4 and L5 nerve roots will be required for simple interbody fusion alone. L4-5 posterior  lumbar interbody fusion, L4-5 posterior lateral arthodesis--- Dr. Annette Stable 10/15/2014  Antibiotics:  IV Flagyl 10/24/2014  Oral vancomycin 10/21/2014>>> 10/25/2014  Oral Keflex 10/17/2014>>>> 10/22/2014  Vancomycin enema 10/25/2014  HPI/Subjective: Patient states she's feeling better. Patient denies any nausea or emesis states she is tolerating soft diet.   Objective: Filed Vitals:   10/29/14 1354  BP: 116/80  Pulse: 98  Temp: 98.4 F (36.9 C)  Resp: 20    Intake/Output Summary (Last 24 hours) at 10/29/14 1552 Last data filed at 10/29/14 0263  Gross per 24 hour  Intake    360 ml  Output      0 ml  Net    360 ml   Filed Weights   10/15/14 0757 10/15/14 0827  Weight: 90.719 kg (200 lb) 90.719 kg (200 lb)    Exam:   General:  NAD  Cardiovascular: RRR  Respiratory: CTAB  Abdomen: Soft, nontender, nondistended, positive bowel sounds.  Musculoskeletal: No clubbing cyanosis or edema.  Data Reviewed: Basic Metabolic Panel:  Recent Labs Lab 10/25/14 0551 10/25/14 1300 10/26/14 0057 10/27/14 0455 10/28/14 0825 10/29/14 0550  NA 144  --  145 143 139 140  K 3.3*  --  3.7 3.7 3.9 3.7  CL 112  --  114* 114* 115* 112  CO2 22  --  28 24 20 23   GLUCOSE 116*  --  112* 101* 107* 94  BUN 29*  --  24* 16 13 13   CREATININE 1.22*  --  1.10 0.98 0.93 0.95  CALCIUM 7.3*  --  7.2* 7.3* 7.4* 7.2*  MG  --  2.9*  --  1.8 1.6 1.9   Liver Function Tests: No results for input(s): AST, ALT, ALKPHOS, BILITOT, PROT, ALBUMIN in the last 168 hours. No results for input(s): LIPASE, AMYLASE in the last 168 hours. No results for input(s): AMMONIA in the last 168 hours. CBC:  Recent Labs Lab 10/25/14 0551 10/26/14 0057 10/27/14 0455 10/28/14 0825 10/29/14 0550  WBC 40.2* 29.1* 20.3* 15.3* 12.7*  NEUTROABS  --  23.6* 15.5*  --   --   HGB 11.9* 12.1 11.6* 11.3* 11.3*  HCT 34.3* 35.2* 34.3* 33.8* 33.1*  MCV 85.8 84.8 86.6 87.6 86.2  PLT 350 340 362 378 383   Cardiac  Enzymes:  Recent Labs Lab 10/25/14 1315 10/25/14 1857 10/26/14 0057  TROPONINI 0.06* 0.07* 0.04*   BNP (last 3 results) No results for input(s): BNP in the last 8760 hours.  ProBNP (last 3 results) No results for input(s): PROBNP in the last 8760 hours.  CBG:  Recent Labs Lab 10/28/14 1223 10/28/14 1658 10/28/14 2205 10/29/14 0645 10/29/14 1136  GLUCAP 114* 101* 98 100* 113*    Recent Results (from the past 240 hour(s))  Clostridium Difficile by PCR     Status: Abnormal   Collection Time: 10/21/14  1:52 PM  Result Value Ref Range Status   C difficile by pcr POSITIVE (A) NEGATIVE Final    Comment: CRITICAL RESULT CALLED TO, READ BACK BY AND VERIFIED WITH: T. [facility] RN 15:15 10/21/14 ([email or web address])   Culture, blood (routine x 2)     Status: None   Collection Time: 10/22/14  3:50 AM  Result Value Ref Range Status   Specimen Description BLOOD RIGHT ARM  Final   Special Requests BOTTLES DRAWN AEROBIC AND ANAEROBIC 5CC  Final   Culture   Final    NO GROWTH 5 DAYS Performed at [facility]    Report Status 10/28/2014 FINAL  Final  Culture, blood (routine x 2)     Status: None   Collection Time: 10/22/14  3:59 AM  Result Value Ref Range Status  Specimen Description BLOOD RIGHT HAND  Final   Special Requests BOTTLES DRAWN AEROBIC AND ANAEROBIC 5CC  Final   Culture   Final    NO GROWTH 5 DAYS Performed at [facility]    Report Status 10/28/2014 FINAL  Final     Studies: No results found.  Scheduled Meds: . atorvastatin  20 mg Oral q1800  . famotidine  20 mg Oral Daily  . fluticasone  2 spray Each Nare Daily  . levothyroxine  50 mcg Oral QAC breakfast  . metroNIDAZOLE  500 mg Oral 3 times per day  . saccharomyces boulardii  250 mg Oral BID  . sodium chloride  3 mL Intravenous Q12H  . vancomycin  250 mg Oral 4 times per day   Continuous Infusions: . sodium chloride 250 mL (10/16/14 0119)    Principal Problem:   C. difficile colitis Active  Problems:   Chest pain   Hypothyroidism   Essential hypertension   GERD   Degenerative spondylolisthesis   Dehydration   Renal failure, acute on chronic   Leukocytosis   Pain in the chest   Abnormal EKG   Ileus    Time spent: 40 mins    [facility] MD [facility] Pager [phone]. If 7PM-7AM, please contact night-coverage at [email or web address], password [facility] 10/29/2014, 3:52 PM  LOS: 14 days

## 2014-10-29 NOTE — Discharge Summary (Signed)
Physician Discharge Summary  Patient ID: [name] MRN: [id number] DOB/AGE: [birth year] 69 y.o.  Admit date: 10/15/2014 Discharge date: 10/29/2014  Admission Diagnoses: Lumbar spondylolisthesis  Discharge Diagnoses: Same Principal Problem:   C. difficile colitis Active Problems:   Hypothyroidism   Essential hypertension   GERD   Degenerative spondylolisthesis   Dehydration   Renal failure, acute on chronic   Leukocytosis   Pain in the chest   Abnormal EKG   Chest pain   Ileus   Discharged Condition: Stable  Hospital Course:  Mrs. ESBEYDI MANAGO is a 69 y.o. female underwent L4-5 lumbar fusion and was electively admitted. Her postoperative course was complicated initially by C.Diff colitis which was treated with oral vancomycin and flagyl. She did have an episode of chest pain and was seen by cardiology, but her pain resolved and did not require any intervention. She also did have increase in her creatnine, but this also normalized. She began to regain appetite and slowly started ambulating with PT and OT. Her diet was advanced and her diarrhea improved on oral vanco/flagyl. She was ultimately deemed stable for d/c with Home health PT.  Treatments: Surgery - L4-5 lumbar fusion  Discharge Exam: Blood pressure 116/80, pulse 98, temperature 98.4 F (36.9 C), temperature source Oral, resp. rate 20, height 5\' 6"  (1.676 m), weight 90.719 kg (200 lb), SpO2 100 %. Awake, alert, oriented Speech fluent, appropriate CN grossly intact 5/5 BUE/BLE Wound c/d/i  Follow-up: See below  Disposition: 01-Home or Self Care  Discharge Instructions    Ambulatory referral to [place]    Complete by:  As directed   Please evaluate [name] for admission to [place].  Disciplines requested: Physical Therapy  Services to provide: Strengthening Exercises and Evaluate  Physician to follow patient's care (the person listed here will be responsible for signing ongoing orders): Other: Dr.  Doreatha Lew. Pool, MD  Requested Start of Care Date: Tomorrow  I certify that this patient is under my care and that I, or a Nurse Practitioner or Physician's Assistant working with me, had a face-to-face encounter that meets the physician face-to-face requirements with patient on 10/29/14. The encounter with the patient was in whole, or in part for the following medical condition(s) which is the primary reason for home health care (List medical condition). Spondylolisthesis, stenosis, C. Difficile colitis  Does the patient have Medicare or Medicaid?:  Yes  The encounter with the patient was in whole, or in part, for the following medical condition, which is the primary reason for home health care:  spondylolisthesis  Reason for Medically Necessary Home Health Services:  Therapy- Therapeutic Exercises to Increase Strength and Endurance  My clinical findings support the need for the above services:  Unable to leave home safely without assistance and/or assistive device  I certify that, based on my findings, the following services are medically necessary home health services:  Physical therapy  Further, I certify that my clinical findings support that this patient is homebound due to:  Unable to leave home safely without assistance     Diet Carb Modified    Complete by:  As directed   Soft diet and slowly advance to carb modified diet.     Discharge instructions    Complete by:  As directed   Follow up with TODD,JEFFREY ALLEN, MD in 1 week. Follow up with Dr Ron Parker, cardiology in 2 weeks. Soft diet and slowly advance to carb modified diet.  Medication List    STOP taking these medications        HYDROcodone-acetaminophen 5-325 MG per tablet  Commonly known as:  NORCO/VICODIN     losartan-hydrochlorothiazide 100-12.5 MG per tablet  Commonly known as:  HYZAAR     pantoprazole 40 MG tablet  Commonly known as:  PROTONIX      TAKE these medications        acetaminophen 500 MG tablet   Commonly known as:  TYLENOL  Take 500 mg by mouth 2 (two) times daily as needed (pain).     atorvastatin 20 MG tablet  Commonly known as:  LIPITOR  Take 1 tablet (20 mg total) by mouth daily.     carvedilol 3.125 MG tablet  Commonly known as:  COREG  Take 1 tablet (3.125 mg total) by mouth 2 (two) times daily with a meal.     cyclobenzaprine 5 MG tablet  Commonly known as:  FLEXERIL  Take 1 tablet (5 mg total) by mouth 3 (three) times daily as needed for muscle spasms.     diazepam 5 MG tablet  Commonly known as:  VALIUM  Take 1-2 tablets (5-10 mg total) by mouth every 6 (six) hours as needed for muscle spasms.     famotidine 20 MG tablet  Commonly known as:  PEPCID  Take 1 tablet (20 mg total) by mouth daily.     fluticasone 50 MCG/ACT nasal spray  Commonly known as:  FLONASE  Place 2 sprays into the nose daily.     glucose blood test strip  Commonly known as:  ONETOUCH VERIO  Use once daily for check glucose levels.  Dx E11.9     hydroxychloroquine 200 MG tablet  Commonly known as:  PLAQUENIL  Take 2 tablets (400 mg total) by mouth daily. Resume on 11/10/14. Take 2 by mouth daily  Start taking on:  11/10/2014     Lancets Misc  by Does not apply route.     leflunomide 10 MG tablet  Commonly known as:  ARAVA  Take 2 tablets (20 mg total) by mouth daily. Resume on 11/10/14  Start taking on:  11/10/2014     levothyroxine 50 MCG tablet  Commonly known as:  SYNTHROID, LEVOTHROID  Take 1 tablet (50 mcg total) by mouth daily.     metFORMIN 500 MG tablet  Commonly known as:  GLUCOPHAGE  Take 1 tablet (500 mg total) by mouth daily with breakfast.     metroNIDAZOLE 500 MG tablet  Commonly known as:  FLAGYL  Take 1 tablet (500 mg total) by mouth every 8 (eight) hours. Take for 9 days then stop.     oxyCODONE-acetaminophen 5-325 MG per tablet  Commonly known as:  PERCOCET/ROXICET  Take 1-2 tablets by mouth every 4 (four) hours as needed for moderate pain.     RESTORA  Caps  Take 1 capsule by mouth daily.     vancomycin 50 mg/mL oral solution  Commonly known as:  VANCOCIN  Take 5 mLs (250 mg total) by mouth every 6 (six) hours. Take for 9 days then stop.           Follow-up Information    Follow up with Dola Argyle, MD. Schedule an appointment as soon as possible for a visit in 2 weeks.   Specialty:  Cardiology   Contact information:   [address] [address] [phone]       Follow up with TODD,JEFFREY Zenia Resides, MD. Schedule an  appointment as soon as possible for a visit in 1 week.   Specialty:  Family Medicine   Contact information:   [address] [place] [address] [phone]       Follow up with Earnie Larsson A, MD. Schedule an appointment as soon as possible for a visit in 3 weeks.   Specialty:  Neurosurgery   Why:  Routine postoperative follow-up   Contact information:   [address] [place] [address] [phone]       Signed: Jairo Ben 10/29/2014, 4:12 PM

## 2014-10-29 NOTE — Progress Notes (Signed)
Pt d/c to home by car with family. Assessment stable. Prescriptions given. Pt verbalizes understanding of d/c instructions.

## 2014-10-29 NOTE — Progress Notes (Signed)
Physical Therapy Treatment Patient Details Name: [name] MRN: [id number] DOB: 1946/03/16 Today's Date: 10/29/2014    History of Present Illness s/p posterior lumber fusion 1 level; PMH: TKR, OA, RA, back surgery in 2014,Chronic kidney disease. Hospitalization complicated by suspected ileus, C.diff and leukocytosis.     PT Comments    Patient progressing well with mobility. Improved ambulation distance today with notable dyspnea and fatigue. Demonstrates good transfer technique. Reviewed back precautions. Discussed slowly increasing activity level at home and to try to change positions and mobilize every 45 minutes. Pt will have 24/7 S at home and may possible d/c today. If not, would benefit from stair negotiation next session. Goals continue to be appropriate. Will continue to follow per current POC.    Follow Up Recommendations  Home health PT;Supervision/Assistance - 24 hour     Equipment Recommendations  None recommended by PT    Recommendations for Other Services       Precautions / Restrictions Precautions Precautions: Back;Fall Precaution Booklet Issued: No Precaution Comments: Pt able to verbalize 2/3 back precautions. Needed cues to verbalize last precaution. Required Braces or Orthoses: Spinal Brace Spinal Brace: Lumbar corset;Applied in sitting position Restrictions Weight Bearing Restrictions: No    Mobility  Bed Mobility               General bed mobility comments: Pt received sitting in chair upon PT arrival.   Transfers Overall transfer level: Needs assistance Equipment used: Rolling walker (2 wheeled) Transfers: Sit to/from Stand Sit to Stand: Supervision         General transfer comment: Supervision for safety. Good demo of hand placement. Stood from chair x2, from toilet x1.   Ambulation/Gait Ambulation/Gait assistance: Min guard Ambulation Distance (Feet): 175 Feet Assistive device: Rolling walker (2 wheeled) Gait Pattern/deviations:  Step-through pattern;Decreased stride length Gait velocity: decreased   General Gait Details: Cues for upright posture and to maintain precautions during gait. Dyspnea present. HR increased to 121 bpm   Stairs Stairs:  (Stairs deferred secondary to fatigue, elevated HR.)          Wheelchair Mobility    Modified Rankin (Stroke Patients Only)       Balance Overall balance assessment: Needs assistance Sitting-balance support: Feet supported;No upper extremity supported Sitting balance-Leahy Scale: Good Sitting balance - Comments: Able to donn LSO with setup without LOB and perform pericare reaching outside BoS without difficulty.    Standing balance support: During functional activity Standing balance-Leahy Scale: Fair Standing balance comment: Tolerated dynamic standing at sink performing hand washing without UE support for short periods. Relient on RW for ambulation.                    Cognition Arousal/Alertness: Awake/alert Behavior During Therapy: WFL for tasks assessed/performed Overall Cognitive Status: Within Functional Limits for tasks assessed       Memory: Decreased recall of precautions              Exercises      General Comments General comments (skin integrity, edema, etc.): Pt's daughter present in room during session. Discussed disposition plans/needs.      Pertinent Vitals/Pain Pain Assessment: 0-10 Pain Score: 5  Pain Location: back at surgical site Pain Descriptors / Indicators: Sore Pain Intervention(s): Monitored during session;Repositioned;Premedicated before session    Home Living                      Prior Function  PT Goals (current goals can now be found in the care plan section) Progress towards PT goals: Progressing toward goals    Frequency  Min 5X/week    PT Plan Current plan remains appropriate    Co-evaluation             End of Session Equipment Utilized During Treatment: Back  brace;Gait belt Activity Tolerance: Patient limited by fatigue;Patient tolerated treatment well Patient left: in chair;with call bell/phone within reach;with chair alarm set;with family/visitor present     Time: 9233-0076 PT Time Calculation (min) (ACUTE ONLY): 35 min  Charges:  $Gait Training: 8-22 mins $Therapeutic Activity: 8-22 mins                    G CodesCandy Sledge A 11-06-2014, 2:35 PM Candy Sledge, [place], DPT [phone]

## 2014-11-04 ENCOUNTER — Encounter: Payer: Self-pay | Admitting: Internal Medicine

## 2014-11-04 ENCOUNTER — Telehealth: Payer: Self-pay | Admitting: Family Medicine

## 2014-11-04 ENCOUNTER — Ambulatory Visit (INDEPENDENT_AMBULATORY_CARE_PROVIDER_SITE_OTHER): Payer: Medicare HMO | Admitting: Internal Medicine

## 2014-11-04 VITALS — BP 114/70 | Temp 97.5°F | Wt 188.0 lb

## 2014-11-04 DIAGNOSIS — R112 Nausea with vomiting, unspecified: Secondary | ICD-10-CM

## 2014-11-04 DIAGNOSIS — E139 Other specified diabetes mellitus without complications: Secondary | ICD-10-CM

## 2014-11-04 DIAGNOSIS — Z09 Encounter for follow-up examination after completed treatment for conditions other than malignant neoplasm: Secondary | ICD-10-CM

## 2014-11-04 DIAGNOSIS — A047 Enterocolitis due to Clostridium difficile: Secondary | ICD-10-CM | POA: Diagnosis not present

## 2014-11-04 DIAGNOSIS — M069 Rheumatoid arthritis, unspecified: Secondary | ICD-10-CM

## 2014-11-04 DIAGNOSIS — A0472 Enterocolitis due to Clostridium difficile, not specified as recurrent: Secondary | ICD-10-CM

## 2014-11-04 MED ORDER — ONDANSETRON 4 MG PO TBDP: 4.0000 mg | ORAL_TABLET | Freq: Three times a day (TID) | ORAL | Status: DC | PRN

## 2014-11-04 NOTE — Patient Instructions (Addendum)
Can add med or vomiting  Will notify you  of labs when available.   Contacting  Id   for consideration of other  Interventions.such as  Other meds such as fidaxomicin  .   Clostridium Difficile Infection Clostridium difficile (C. difficile) is a bacteria found in the intestinal tract or colon. Under certain conditions, it causes diarrhea and sometimes severe disease. The severe form of the disease is known as pseudomembranous colitis (often called C. difficile colitis). This disease can damage the lining of the colon or cause the colon to become enlarged (toxic megacolon). CAUSES Your colon normally contains many different bacteria, including C. difficile. The balance of bacteria in your colon can change during illness. This is especially true when you take antibiotic medicine. Taking antibiotics may allow the C. difficile to grow, multiply excessively, and make a toxin that then causes illness. The elderly and people with certain medical conditions have a greater risk of getting C. difficile infections. SYMPTOMS  Watery diarrhea.  Fever.  Fatigue.  Loss of appetite.  Nausea.  Abdominal swelling, pain, or tenderness.  Dehydration. DIAGNOSIS Your symptoms may make your caregiver suspect a C. difficile infection, especially if you have used antibiotics in the preceding weeks. However, there are only 2 ways to know for certain whether you have a C. difficile infection:  A lab test that finds the toxin in your stool.  The specific appearance of an abnormality (pseudomembrane) in your colon. This can only be seen by doing a sigmoidoscopy or colonoscopy. These procedures involve passing an instrument through your rectum to look at the inside of your colon. Your caregiver will help determine if these tests are necessary. TREATMENT  Most people are successfully treated with one of two specific antibiotics, usually given by mouth. Other antibiotics you are receiving are stopped if  possible.  Intravenous (IV) fluids and correction of electrolyte imbalance may be necessary.  Rarely, surgery may be needed to remove the infected part of the intestines.  Careful hand washing by you and your caregivers is important to prevent the spread of infection. In the hospital, your caregivers may also put on gowns and gloves to prevent the spread of the C. difficile bacteria. Your room is also cleaned regularly with a solution containing bleach or a product that is known to kill C. difficile. HOME CARE INSTRUCTIONS  Drink enough fluids to keep your urine clear or pale yellow. Avoid milk, caffeine, and alcohol.  Ask your caregiver for specific rehydration instructions.  Try eating small, frequent meals rather than large meals.  Take your antibiotics as directed. Finish them even if you start to feel better.  Do not use medicines to slow diarrhea. This could delay healing or cause complications.  Wash your hands thoroughly after using the bathroom and before preparing food.  Make sure people who live with you wash their hands often, too.  Carefully disinfect all surfaces with a product that contains chlorine bleach. SEEK MEDICAL CARE IF:  Diarrhea persists longer than expected or recurs after completing your course of antibiotic treatment for the C. difficile infection.  You have trouble staying hydrated. SEEK IMMEDIATE MEDICAL CARE IF:  You develop a new fever.  You have increasing abdominal pain or tenderness.  There is blood in your stools, or your stools are dark black and tarry.  You cannot hold down food or liquids. MAKE SURE YOU:  Understand these instructions.  Will watch your condition.  Will get help right away if you are not doing  well or get worse. Document Released: 04/28/2005 Document Revised: 12/03/2013 Document Reviewed: 12/25/2010 [facility] Patient Information 2015 [place], [place]. This information is not intended to replace advice given to you by  your health care provider. Make sure you discuss any questions you have with your health care provider.  If fever worsening contact on call  If dehydrated fever  go back to ed .

## 2014-11-04 NOTE — Progress Notes (Signed)
Pre visit review using our clinic review tool, if applicable. No additional management support is needed unless otherwise documented below in the visit note.   Chief Complaint  Patient presents with  . Diarrhea    Pt diagnosed with c-diff while in the hospital. post hospital    HPI: Patient [name]  comes in  With son  Hx of ra dm s/p back surgeryand c diff dx   Comes in for sda acute   problem evaluation.  Is post hospital    March 29th  For lumbard surgery complicated by c diff    And told to see a doctor.  By home health and triage   Was noted to  Have diarrhea abd pain with wbc in 50 k range  Pos c diff and placed ion metronidazole and  Vancomycin   To take 9 days post dc 250 mg q6 hours    However " Cant keep  Things down " Vomiting  1-3 x per day and  Loose  stool about 8 per 24 hours   Dark no blood    Never got better  Since d/c     Not worse  Except thinks   More  tender then   Left hospital clear liquid mostly and not able to advance .  sofar  Diarrhea  now   8-x per day .  No fever .[name] KitchenVomiting  3 x per day.     No fever  Has RA . On meds  Back i s "ok. "   ROS: See pertinent positives and negatives per HPI.  No cp sob  On  meds for RA  Had some type opf gi probem in past but not this  Past Medical History  Diagnosis Date  . Allergy   . GERD (gastroesophageal reflux disease)   . Hypertension   . Thyroid disease   . Costochondritis   . S/P TKR (total knee replacement) 09  . S/P TAH (total abdominal hysterectomy)   . Total knee replacement status 09/2007    redo  . Diabetes mellitus without complication     type 2  . Shortness of breath     Hx: of with exertion  . Pneumonia     Hx: of  . Tingling     Hx: of in feet  . Arthritis     RA (Dr. Ouida Sills)  . Osteoarthritis   . Gastritis   . Colon polyps 2008    HYPERPLASTIC  . Chronic kidney disease     rt kidney removed    Family History  Problem Relation Age of Onset  . Rheum arthritis Mother   . Diabetes  Brother     x 2  . Colon cancer Neg Hx   . Prostate cancer Father   . Heart disease Father   . Esophageal cancer Neg Hx   . Pancreatic cancer Neg Hx   . Kidney disease Son   . Liver disease Neg Hx     History   Social History  . Marital Status: Divorced    Spouse Name: N/A  . Number of Children: 3  . Years of Education: N/A   Occupational History  . Retired    Social History Main Topics  . Smoking status: Current Every Day Smoker -- 0.25 packs/day for 15 years    Types: Cigarettes  . Smokeless tobacco: Never Used     Comment: form given 08-31-13  . Alcohol Use: Yes     Comment: occ  .  Drug Use: No  . Sexual Activity: Not on file   Other Topics Concern  . None   Social History Narrative   Single   Never Smoked    Alcohol use- no   Drug use-no   Regular Exercise-yes   Former Smoker- 12/2008    Outpatient Encounter Prescriptions as of 11/04/2014  Medication Sig  . acetaminophen (TYLENOL) 500 MG tablet Take 500 mg by mouth 2 (two) times daily as needed (pain).  [name] Kitchen atorvastatin (LIPITOR) 20 MG tablet Take 1 tablet (20 mg total) by mouth daily.  . carvedilol (COREG) 3.125 MG tablet Take 1 tablet (3.125 mg total) by mouth 2 (two) times daily with a meal.  . famotidine (PEPCID) 20 MG tablet Take 1 tablet (20 mg total) by mouth daily.  . fluticasone (FLONASE) 50 MCG/ACT nasal spray Place 2 sprays into the nose daily.  [name] Kitchen glucose blood (ONETOUCH VERIO) test strip Use once daily for check glucose levels.  Dx E11.9  . Lancets MISC by Does not apply route.    [name] Kitchen levothyroxine (SYNTHROID, LEVOTHROID) 50 MCG tablet Take 1 tablet (50 mcg total) by mouth daily.  . metFORMIN (GLUCOPHAGE) 500 MG tablet Take 1 tablet (500 mg total) by mouth daily with breakfast. (Patient taking differently: Take 250 mg by mouth daily with breakfast. )  . metroNIDAZOLE (FLAGYL) 500 MG tablet Take 1 tablet (500 mg total) by mouth every 8 (eight) hours. Take for 9 days then stop.  . vancomycin (VANCOCIN) 50  mg/mL oral solution Take 5 mLs (250 mg total) by mouth every 6 (six) hours. Take for 9 days then stop.  . cyclobenzaprine (FLEXERIL) 5 MG tablet Take 1 tablet (5 mg total) by mouth 3 (three) times daily as needed for muscle spasms. (Patient not taking: Reported on 11/04/2014)  . diazepam (VALIUM) 5 MG tablet Take 1-2 tablets (5-10 mg total) by mouth every 6 (six) hours as needed for muscle spasms. (Patient not taking: Reported on 11/04/2014)  . [START ON 11/10/2014] hydroxychloroquine (PLAQUENIL) 200 MG tablet Take 2 tablets (400 mg total) by mouth daily. Resume on 11/10/14. Take 2 by mouth daily (Patient not taking: Reported on 11/04/2014)  . [START ON 11/10/2014] leflunomide (ARAVA) 10 MG tablet Take 2 tablets (20 mg total) by mouth daily. Resume on 11/10/14 (Patient not taking: Reported on 11/04/2014)  . ondansetron (ZOFRAN-ODT) 4 MG disintegrating tablet Take 1 tablet (4 mg total) by mouth every 8 (eight) hours as needed for nausea or vomiting.  . [DISCONTINUED] oxyCODONE-acetaminophen (PERCOCET/ROXICET) 5-325 MG per tablet Take 1-2 tablets by mouth every 4 (four) hours as needed for moderate pain.  . [DISCONTINUED] Probiotic Product (RESTORA) CAPS Take 1 capsule by mouth daily. (Patient not taking: Reported on 10/08/2014)    EXAM:  BP 114/70 mmHg  Temp(Src) 97.5 F (36.4 C) (Oral)  Wt 188 lb (85.276 kg)  Body mass index is 30.36 kg/(m^2).  GENERAL: vitals reviewed and listed above, alert, oriented, with walker  Non toxic but some illness  and in no acute distress HEENT: atraumatic, conjunctiva  clear, no obvious abnormalities on inspection of external nose and ears  Had to use b room during viit sot office  NECK: no obvious masses on inspection palpation  LUNGS: clear to auscultation bilaterally, no wheezes, rales or rhonchi, good air movement CV: HRRR, no clubbing cyanosis or  peripheral edema nl cap refill  abd bs persent  Very tender  No rebound some guard  Diffuse  MS: moves all extremities with  walker  Can  get up on table  PSYCH: pleasant and cooperative,  dosent feel well Skin: normal capillary refill ,turgor , color: No acute rashes ,petechiae or bruising  Wt Readings from Last 3 Encounters:  11/04/14 188 lb (85.276 kg)  10/15/14 200 lb (90.719 kg)  09/03/14 198 lb (89.812 kg)   Lab Results  Component Value Date   WBC 12.7* 10/29/2014   HGB 11.3* 10/29/2014   HCT 33.1* 10/29/2014   PLT 383 10/29/2014   GLUCOSE 94 10/29/2014   CHOL 181 12/11/2013   TRIG 128.0 12/11/2013   HDL 63.10 12/11/2013   LDLDIRECT 171.5 12/06/2011   LDLCALC 92 12/11/2013   ALT 23 12/11/2013   AST 34 12/11/2013   NA 140 10/29/2014   K 3.7 10/29/2014   CL 112 10/29/2014   CREATININE 0.95 10/29/2014   BUN 13 10/29/2014   CO2 23 10/29/2014   TSH 1.79 12/11/2013   INR 1.0 01/09/2009   HGBA1C 5.6 06/13/2014   MICROALBUR 31.1* 06/13/2014      ASSESSMENT AND PLAN:  Discussed the following assessment and plan:  Enteritis due to Clostridium difficile dx in hospital  - had metronidazole and  on vancomycin 250 qid 1 week out of hospital  into and not better told to come in emergently  today . lab  - Plan: Basic metabolic panel, CBC with Differential/Platelet  Nausea and vomiting, vomiting of unspecified type - Plan: Basic metabolic panel, CBC with Differential/Platelet  Hospital discharge follow-up  Diabetes 1.5, managed as type 2  Rheumatoid arthritis Call   ID  After 5 pm  Will call in am  Otherwise  To ed with alarm sx .disc  Keep appt on Thursday .  For fu  Or as needed  -Patient advised to return or notify health care team  if symptoms worsen ,persist or new concerns arise. Total visit 15mins > 50% spent counseling and coordinating care     Patient Instructions  Can add med or vomiting  Will notify you  of labs when available.   Contacting  Id   for consideration of other  Interventions.such as  Other meds such as fidaxomicin  .   Clostridium Difficile Infection Clostridium  difficile (C. difficile) is a bacteria found in the intestinal tract or colon. Under certain conditions, it causes diarrhea and sometimes severe disease. The severe form of the disease is known as pseudomembranous colitis (often called C. difficile colitis). This disease can damage the lining of the colon or cause the colon to become enlarged (toxic megacolon). CAUSES Your colon normally contains many different bacteria, including C. difficile. The balance of bacteria in your colon can change during illness. This is especially true when you take antibiotic medicine. Taking antibiotics may allow the C. difficile to grow, multiply excessively, and make a toxin that then causes illness. The elderly and people with certain medical conditions have a greater risk of getting C. difficile infections. SYMPTOMS  Watery diarrhea.  Fever.  Fatigue.  Loss of appetite.  Nausea.  Abdominal swelling, pain, or tenderness.  Dehydration. DIAGNOSIS Your symptoms may make your caregiver suspect a C. difficile infection, especially if you have used antibiotics in the preceding weeks. However, there are only 2 ways to know for certain whether you have a C. difficile infection:  A lab test that finds the toxin in your stool.  The specific appearance of an abnormality (pseudomembrane) in your colon. This can only be seen by doing a sigmoidoscopy or colonoscopy. These procedures involve passing an instrument through  your rectum to look at the inside of your colon. Your caregiver will help determine if these tests are necessary. TREATMENT  Most people are successfully treated with one of two specific antibiotics, usually given by mouth. Other antibiotics you are receiving are stopped if possible.  Intravenous (IV) fluids and correction of electrolyte imbalance may be necessary.  Rarely, surgery may be needed to remove the infected part of the intestines.  Careful hand washing by you and your caregivers is  important to prevent the spread of infection. In the hospital, your caregivers may also put on gowns and gloves to prevent the spread of the C. difficile bacteria. Your room is also cleaned regularly with a solution containing bleach or a product that is known to kill C. difficile. HOME CARE INSTRUCTIONS  Drink enough fluids to keep your urine clear or pale yellow. Avoid milk, caffeine, and alcohol.  Ask your caregiver for specific rehydration instructions.  Try eating small, frequent meals rather than large meals.  Take your antibiotics as directed. Finish them even if you start to feel better.  Do not use medicines to slow diarrhea. This could delay healing or cause complications.  Wash your hands thoroughly after using the bathroom and before preparing food.  Make sure people who live with you wash their hands often, too.  Carefully disinfect all surfaces with a product that contains chlorine bleach. SEEK MEDICAL CARE IF:  Diarrhea persists longer than expected or recurs after completing your course of antibiotic treatment for the C. difficile infection.  You have trouble staying hydrated. SEEK IMMEDIATE MEDICAL CARE IF:  You develop a new fever.  You have increasing abdominal pain or tenderness.  There is blood in your stools, or your stools are dark black and tarry.  You cannot hold down food or liquids. MAKE SURE YOU:  Understand these instructions.  Will watch your condition.  Will get help right away if you are not doing well or get worse. Document Released: 04/28/2005 Document Revised: 12/03/2013 Document Reviewed: 12/25/2010 [facility] Patient Information 2015 [place], [place]. This information is not intended to replace advice given to you by your health care provider. Make sure you discuss any questions you have with your health care provider.  If fever worsening contact on call  If dehydrated fever  go back to ed .      Standley Brooking. Panosh M.D.

## 2014-11-04 NOTE — Telephone Encounter (Signed)
Noted  

## 2014-11-04 NOTE — Telephone Encounter (Signed)
Patient has an appointment today with Dr Regis Bill

## 2014-11-04 NOTE — Telephone Encounter (Signed)
Daughter states pt has cdiff, and also vomiting. Has hosp fup on thurs,w/ dr bur but daughter requesting. Transferred pt to triage. Pt wanted to be seen sooner. No appts  Asked if you were in and requested I let you know as well

## 2014-11-04 NOTE — Telephone Encounter (Signed)
Patient Name: [name]  DOB: 1946-07-20    Initial Comment caller states diarrhea, vomiting, nausea and no energy   Nurse Assessment  Nurse: Leilani Merl, RN, Heather Date/Time (Eastern Time): 11/04/2014 12:47:39 PM  Confirm and document reason for call. If symptomatic, describe symptoms. ---Caller states that her mother had surgery on 03/15 and she developed c-diff a few days after that and was in the hospital for treatment until 03/29 and she was sent home with antibiotics. Caller states that her mother is not better, she is still having diarrhea with mucus, no appetite, no energy, and is vomiting some everyday. They are trying to keep her hydrated, she urinated last at 11:30 am.  Has the patient traveled out of the country within the last 30 days? ---Not Applicable  Does the patient require triage? ---Yes  Related visit to physician within the last 2 weeks? ---No  Does the PT have any chronic conditions? (i.e. diabetes, asthma, etc.) ---Yes  List chronic conditions. ---DM, back problems     Guidelines    Guideline Title Affirmed Question Affirmed Notes  Diarrhea [1] Mucus or pus in stool AND [2] present > 2 days AND [3] diarrhea is more than mild    Final Disposition User   See Physician within 24 Hours Stand[place], RN, Water quality scientist    Comments  Appt made with Dr. Regis Bill at 4 pm

## 2014-11-05 LAB — CBC WITH DIFFERENTIAL/PLATELET
Basophils Absolute: 0 10*3/uL (ref 0.0–0.1)
Basophils Relative: 0.5 % (ref 0.0–3.0)
Eosinophils Absolute: 0.1 10*3/uL (ref 0.0–0.7)
Eosinophils Relative: 1 % (ref 0.0–5.0)
HCT: 31.8 % — ABNORMAL LOW (ref 36.0–46.0)
Hemoglobin: 10.8 g/dL — ABNORMAL LOW (ref 12.0–15.0)
Lymphocytes Relative: 24.5 % (ref 12.0–46.0)
Lymphs Abs: 1.9 10*3/uL (ref 0.7–4.0)
MCHC: 33.9 g/dL (ref 30.0–36.0)
MCV: 87.8 fl (ref 78.0–100.0)
Monocytes Absolute: 0.9 10*3/uL (ref 0.1–1.0)
Monocytes Relative: 11.9 % (ref 3.0–12.0)
Neutro Abs: 4.7 10*3/uL (ref 1.4–7.7)
Neutrophils Relative %: 62.1 % (ref 43.0–77.0)
Platelets: 477 10*3/uL — ABNORMAL HIGH (ref 150.0–400.0)
RBC: 3.62 Mil/uL — ABNORMAL LOW (ref 3.87–5.11)
RDW: 16.2 % — ABNORMAL HIGH (ref 11.5–15.5)
WBC: 7.6 10*3/uL (ref 4.0–10.5)

## 2014-11-05 LAB — BASIC METABOLIC PANEL
BUN: 7 mg/dL (ref 6–23)
CO2: 30 mEq/L (ref 19–32)
Calcium: 8.4 mg/dL (ref 8.4–10.5)
Chloride: 102 mEq/L (ref 96–112)
Creatinine, Ser: 0.89 mg/dL (ref 0.40–1.20)
GFR: 80.46 mL/min (ref >60.00)
Glucose, Bld: 98 mg/dL (ref 70–99)
Potassium: 3 mEq/L — ABNORMAL LOW (ref 3.5–5.1)
Sodium: 142 mEq/L (ref 135–145)

## 2014-11-06 ENCOUNTER — Other Ambulatory Visit: Payer: Self-pay | Admitting: Family Medicine

## 2014-11-06 DIAGNOSIS — E876 Hypokalemia: Secondary | ICD-10-CM

## 2014-11-06 DIAGNOSIS — A498 Other bacterial infections of unspecified site: Secondary | ICD-10-CM

## 2014-11-06 MED ORDER — VANCOMYCIN HCL 125 MG PO CAPS: ORAL_CAPSULE | ORAL | Status: DC

## 2014-11-06 MED ORDER — SACCHAROMYCES BOULARDII 250 MG PO CAPS: 250.0000 mg | ORAL_CAPSULE | Freq: Two times a day (BID) | ORAL | Status: DC

## 2014-11-06 MED ORDER — POTASSIUM CHLORIDE CRYS ER 20 MEQ PO TBCR: 20.0000 meq | EXTENDED_RELEASE_TABLET | Freq: Every day | ORAL | Status: DC

## 2014-11-07 ENCOUNTER — Telehealth: Payer: Self-pay | Admitting: Family Medicine

## 2014-11-07 ENCOUNTER — Ambulatory Visit: Payer: Medicare HMO | Admitting: Family Medicine

## 2014-11-07 NOTE — Telephone Encounter (Signed)
Pt is taking a probiotic, potassium and flagyl. The pills are too large for her to take. She needs liquid versions [facility] into [facility] [address] [facility]. Also need vancomycin liquid called to [facility]. Pt has no appetite and what she does eat come back up.

## 2014-11-07 NOTE — Telephone Encounter (Signed)
Called and advised that Dr. Regis Bill was out of the office until tomorrow morning.

## 2014-11-07 NOTE — Telephone Encounter (Signed)
Patient Name: [name]  DOB: 11-19-1945    Initial Comment Caller states her mother has C-Diff and was given antibiotics. Caller states her mother is having a hard time taking the medicine.    Nurse Assessment  Nurse: Mallie Mussel, RN, Alveta Heimlich Date/Time Eilene Ghazi Time): 11/07/2014 12:48:54 PM  Confirm and document reason for call. If symptomatic, describe symptoms. ---Caller states that her mother was seen on Monday and Probiotics, KlorCon, Metronidazole. These are big and it hurts to swallow. They have tried crushing the meds and mixing with apple sauce and it isn't working either. May need to change to liquid form. I did a warm transfer to [facility] for further assistance.  Has the patient traveled out of the country within the last 30 days? ---Not Applicable  Does the patient require triage? ---No     Guidelines    Guideline Title Affirmed Question Affirmed Notes       Final Disposition User   Clinical Call Mallie Mussel, RN, Alveta Heimlich

## 2014-11-07 NOTE — Telephone Encounter (Signed)
Called and [name] is not available.  [name] is there and [name] gave a verbal that I could speak to both of them on speaker phone. Advised that it is very important for pt to take potassium and pt was given the option to grind the medication up and put it in applesauce or to put it in her favorite.  Both pt and her Blackwell verbalized understanding.  Advised to both that it is important for pt watch her symptoms closely.  If pt has worsening symptoms or new symptoms pt will seek medical attention immediately.  Pt verbalized understanding.  Per pt's Blackwell pt's medicaiton that was called in today cost 99 dollars.  Advised that I would discuss this with the physician in the morning.

## 2014-11-08 MED ORDER — VANCOMYCIN 50 MG/ML ORAL SOLUTION: 250.0000 mg | Freq: Four times a day (QID) | ORAL | Status: AC

## 2014-11-08 NOTE — Telephone Encounter (Signed)
[facility]ed [facility] and spoke with Lennette Bihari and he states the vancomycin capsules were $99 dollars and the solution would be about the same.  Advised that per Dr. Regis Bill there was a way to mix the solution that is cheaper and Lennette Bihari stated he did not know anything about that. [place] and was told that the pt's insurance would have to be ran in order to figure out which one would be cheaper.  A prescription was sent noting to pick the cheapest medication for patient.

## 2014-11-08 NOTE — Telephone Encounter (Signed)
Called and spoke with pt and pt is aware that [facility] has the vancomycin solution for 29.39.  Pt states that she will get her daughter to pick up that prescription.  Pt states she has tried crushing up the potassium tablet in applesauce and it taste horrible. Pt would like to know if there is any other option.   [place] and was advised by the pharmacist that if pt received the M20 [facility] it could be dissolved in 4 oz of water for 2 minutes and then the patient could drink it. Pharmacist states the solution has a bad taste as well. The other possible option could be prescribing the pt 10 meq potassium.   [facility]ed [facility] and spoke with Lennette Bihari- he states that pt was given the M20 potassium tablets.    Called and advised pt of recommendations and pt verbalized understanding.

## 2014-11-14 ENCOUNTER — Telehealth: Payer: Self-pay | Admitting: Family Medicine

## 2014-11-14 NOTE — Telephone Encounter (Signed)
[facility] would like to have order from dr todd so that they can send  nurse out tomorrow to drawn [name] bloodwork  Instead of [name] coming to our office for blood work. Please  call susan at [phone] ext [phone] with verbal orders

## 2014-11-15 ENCOUNTER — Other Ambulatory Visit (INDEPENDENT_AMBULATORY_CARE_PROVIDER_SITE_OTHER): Payer: Medicare HMO

## 2014-11-15 ENCOUNTER — Encounter (HOSPITAL_COMMUNITY): Payer: Self-pay | Admitting: *Deleted

## 2014-11-15 ENCOUNTER — Telehealth: Payer: Self-pay

## 2014-11-15 ENCOUNTER — Emergency Department (HOSPITAL_COMMUNITY)
Admission: EM | Admit: 2014-11-15 | Discharge: 2014-11-16 | Disposition: A | Discharge status: Home / Self Care | Payer: Medicare HMO | Attending: Emergency Medicine | Admitting: Emergency Medicine

## 2014-11-15 DIAGNOSIS — E119 Type 2 diabetes mellitus without complications: Secondary | ICD-10-CM | POA: Diagnosis not present

## 2014-11-15 DIAGNOSIS — Z720 Tobacco use: Secondary | ICD-10-CM | POA: Diagnosis not present

## 2014-11-15 DIAGNOSIS — Z79899 Other long term (current) drug therapy: Secondary | ICD-10-CM | POA: Diagnosis not present

## 2014-11-15 DIAGNOSIS — Z8701 Personal history of pneumonia (recurrent): Secondary | ICD-10-CM | POA: Diagnosis not present

## 2014-11-15 DIAGNOSIS — Z86010 Personal history of colonic polyps: Secondary | ICD-10-CM | POA: Diagnosis not present

## 2014-11-15 DIAGNOSIS — N189 Chronic kidney disease, unspecified: Secondary | ICD-10-CM | POA: Insufficient documentation

## 2014-11-15 DIAGNOSIS — Z7951 Long term (current) use of inhaled steroids: Secondary | ICD-10-CM | POA: Insufficient documentation

## 2014-11-15 DIAGNOSIS — M159 Polyosteoarthritis, unspecified: Secondary | ICD-10-CM | POA: Insufficient documentation

## 2014-11-15 DIAGNOSIS — E079 Disorder of thyroid, unspecified: Secondary | ICD-10-CM | POA: Insufficient documentation

## 2014-11-15 DIAGNOSIS — E876 Hypokalemia: Secondary | ICD-10-CM

## 2014-11-15 DIAGNOSIS — Z96653 Presence of artificial knee joint, bilateral: Secondary | ICD-10-CM | POA: Insufficient documentation

## 2014-11-15 DIAGNOSIS — I129 Hypertensive chronic kidney disease with stage 1 through stage 4 chronic kidney disease, or unspecified chronic kidney disease: Secondary | ICD-10-CM | POA: Insufficient documentation

## 2014-11-15 DIAGNOSIS — K219 Gastro-esophageal reflux disease without esophagitis: Secondary | ICD-10-CM | POA: Insufficient documentation

## 2014-11-15 DIAGNOSIS — R799 Abnormal finding of blood chemistry, unspecified: Secondary | ICD-10-CM | POA: Diagnosis present

## 2014-11-15 LAB — BASIC METABOLIC PANEL
Anion gap: 11 (ref 5–15)
BUN: 7 mg/dL (ref 6–23)
BUN: 5 mg/dL — ABNORMAL LOW (ref 6–23)
CO2: 35 mEq/L — ABNORMAL HIGH (ref 19–32)
CO2: 34 mmol/L — ABNORMAL HIGH (ref 19–32)
Calcium: 8.7 mg/dL (ref 8.4–10.5)
Calcium: 8.4 mg/dL (ref 8.4–10.5)
Chloride: 95 mEq/L — ABNORMAL LOW (ref 96–112)
Chloride: 95 mmol/L — ABNORMAL LOW (ref 96–112)
Creatinine, Ser: 0.99 mg/dL (ref 0.40–1.20)
Creatinine, Ser: 1.22 mg/dL — ABNORMAL HIGH (ref 0.50–1.10)
GFR calc Af Amer: 51 mL/min — ABNORMAL LOW (ref >90)
GFR calc non Af Amer: 44 mL/min — ABNORMAL LOW (ref >90)
GFR: 71.15 mL/min (ref >60.00)
Glucose, Bld: 110 mg/dL — ABNORMAL HIGH (ref 70–99)
Glucose, Bld: 105 mg/dL — ABNORMAL HIGH (ref 70–99)
Potassium: 2.1 mEq/L — CL (ref 3.5–5.1)
Potassium: 2 mmol/L — CL (ref 3.5–5.1)
Sodium: 139 mEq/L (ref 135–145)
Sodium: 140 mmol/L (ref 135–145)

## 2014-11-15 LAB — CBC WITH DIFFERENTIAL/PLATELET
Basophils Absolute: 0.1 10*3/uL (ref 0.0–0.1)
Basophils Relative: 1 % (ref 0–1)
Eosinophils Absolute: 0.2 10*3/uL (ref 0.0–0.7)
Eosinophils Relative: 2 % (ref 0–5)
HCT: 32.1 % — ABNORMAL LOW (ref 36.0–46.0)
Hemoglobin: 10.9 g/dL — ABNORMAL LOW (ref 12.0–15.0)
Lymphocytes Relative: 40 % (ref 12–46)
Lymphs Abs: 3 10*3/uL (ref 0.7–4.0)
MCH: 28.9 pg (ref 26.0–34.0)
MCHC: 34 g/dL (ref 30.0–36.0)
MCV: 85.1 fL (ref 78.0–100.0)
Monocytes Absolute: 1.1 10*3/uL — ABNORMAL HIGH (ref 0.1–1.0)
Monocytes Relative: 14 % — ABNORMAL HIGH (ref 3–12)
Neutro Abs: 3.2 10*3/uL (ref 1.7–7.7)
Neutrophils Relative %: 43 % (ref 43–77)
Platelets: 386 10*3/uL (ref 150–400)
RBC: 3.77 MIL/uL — ABNORMAL LOW (ref 3.87–5.11)
RDW: 17.2 % — ABNORMAL HIGH (ref 11.5–15.5)
WBC: 7.6 10*3/uL (ref 4.0–10.5)

## 2014-11-15 LAB — MAGNESIUM: Magnesium: 1.4 mg/dL — ABNORMAL LOW (ref 1.5–2.5)

## 2014-11-15 MED ORDER — ACETAMINOPHEN 500 MG PO TABS
500.0000 mg | ORAL_TABLET | Freq: Two times a day (BID) | ORAL | Status: DC | PRN
  Administered 2014-11-15: 500 mg via ORAL
  Filled 2014-11-15: qty 1

## 2014-11-15 MED ORDER — POTASSIUM CHLORIDE CRYS ER 20 MEQ PO TBCR
40.0000 meq | EXTENDED_RELEASE_TABLET | Freq: Once | ORAL | Status: AC
  Administered 2014-11-15: 40 meq via ORAL
  Filled 2014-11-15: qty 2

## 2014-11-15 MED ORDER — SODIUM CHLORIDE 0.9 % IV SOLN
INTRAVENOUS | Status: DC
  Administered 2014-11-15: 18:00:00 via INTRAVENOUS

## 2014-11-15 MED ORDER — CYCLOBENZAPRINE HCL 10 MG PO TABS
5.0000 mg | ORAL_TABLET | Freq: Three times a day (TID) | ORAL | Status: DC | PRN
  Administered 2014-11-15: 5 mg via ORAL
  Filled 2014-11-15: qty 1

## 2014-11-15 MED ORDER — POTASSIUM CHLORIDE CRYS ER 20 MEQ PO TBCR: 20.0000 meq | EXTENDED_RELEASE_TABLET | Freq: Two times a day (BID) | ORAL | Status: DC

## 2014-11-15 MED ORDER — MAGNESIUM SULFATE IN D5W 10-5 MG/ML-% IV SOLN
1.0000 g | Freq: Once | INTRAVENOUS | Status: AC
  Administered 2014-11-15: 1 g via INTRAVENOUS
  Filled 2014-11-15: qty 100

## 2014-11-15 MED ORDER — POTASSIUM CHLORIDE 10 MEQ/100ML IV SOLN
10.0000 meq | Freq: Once | INTRAVENOUS | Status: AC
  Administered 2014-11-15: 10 meq via INTRAVENOUS
  Filled 2014-11-15: qty 100

## 2014-11-15 MED ORDER — POTASSIUM CHLORIDE 10 MEQ/100ML IV SOLN
10.0000 meq | INTRAVENOUS | Status: AC
  Administered 2014-11-15: 10 meq via INTRAVENOUS
  Filled 2014-11-15 (×2): qty 100

## 2014-11-15 NOTE — Discharge Instructions (Signed)
Potassium Content of Foods Potassium is a mineral found in many foods and drinks. It helps keep fluids and minerals balanced in your body and affects how steadily your heart beats. Potassium also helps control your blood pressure and keep your muscles and nervous system healthy. Certain health conditions and medicines may change the balance of potassium in your body. When this happens, you can help balance your level of potassium through the foods that you do or do not eat. Your health care provider or dietitian may recommend an amount of potassium that you should have each day. The following lists of foods provide the amount of potassium (in parentheses) per serving in each item. HIGH IN POTASSIUM  The following foods and beverages have 200 mg or more of potassium per serving:  Apricots, 2 raw or 5 dry (200 mg).  Artichoke, 1 medium (345 mg).  Avocado, raw,  each (245 mg).  Banana, 1 medium (425 mg).  Beans, lima, or baked beans, canned,  cup (280 mg).  Beans, white, canned,  cup (595 mg).  Beef roast, 3 oz (320 mg).  Beef, ground, 3 oz (270 mg).  Beets, raw or cooked,  cup (260 mg).  Bran muffin, 2 oz (300 mg).  Broccoli,  cup (230 mg).  Brussels sprouts,  cup (250 mg).  Cantaloupe,  cup (215 mg).  Cereal, 100% bran,  cup (200-400 mg).  Cheeseburger, single, fast food, 1 each (225-400 mg).  Chicken, 3 oz (220 mg).  Clams, canned, 3 oz (535 mg).  Crab, 3 oz (225 mg).  Dates, 5 each (270 mg).  Dried beans and peas,  cup (300-475 mg).  Figs, dried, 2 each (260 mg).  Fish: halibut, tuna, cod, snapper, 3 oz (480 mg).  Fish: salmon, haddock, swordfish, perch, 3 oz (300 mg).  Fish, tuna, canned 3 oz (200 mg).  French fries, fast food, 3 oz (470 mg).  Granola with fruit and nuts,  cup (200 mg).  Grapefruit juice,  cup (200 mg).  Greens, beet,  cup (655 mg).  Honeydew melon,  cup (200 mg).  Kale, raw, 1 cup (300 mg).  Kiwi, 1 medium (240  mg).  Kohlrabi, rutabaga, parsnips,  cup (280 mg).  Lentils,  cup (365 mg).  Mango, 1 each (325 mg).  Milk, chocolate, 1 cup (420 mg).  Milk: nonfat, low-fat, whole, buttermilk, 1 cup (350-380 mg).  Molasses, 1 Tbsp (295 mg).  Mushrooms,  cup (280) mg.  Nectarine, 1 each (275 mg).  Nuts: almonds, peanuts, hazelnuts, [place], cashew, mixed, 1 oz (200 mg).  Nuts, pistachios, 1 oz (295 mg).  Orange, 1 each (240 mg).  Orange juice,  cup (235 mg).  Papaya, medium,  fruit (390 mg).  Peanut butter, chunky, 2 Tbsp (240 mg).  Peanut butter, smooth, 2 Tbsp (210 mg).  Pear, 1 medium (200 mg).  Pomegranate, 1 whole (400 mg).  Pomegranate juice,  cup (215 mg).  Pork, 3 oz (350 mg).  Potato chips, salted, 1 oz (465 mg).  Potato, baked with skin, 1 medium (925 mg).  Potatoes, boiled,  cup (255 mg).  Potatoes, mashed,  cup (330 mg).  Prune juice,  cup (370 mg).  Prunes, 5 each (305 mg).  Pudding, chocolate,  cup (230 mg).  Pumpkin, canned,  cup (250 mg).  Raisins, seedless,  cup (270 mg).  Seeds, sunflower or pumpkin, 1 oz (240 mg).  Soy milk, 1 cup (300 mg).  Spinach,  cup (420 mg).  Spinach, canned,  cup (370 mg).    Sweet potato, baked with skin, 1 medium (450 mg).  Swiss chard,  cup (480 mg).  Tomato or vegetable juice,  cup (275 mg).  Tomato sauce or puree,  cup (400-550 mg).  Tomato, raw, 1 medium (290 mg).  Tomatoes, canned,  cup (200-300 mg).  Turkey, 3 oz (250 mg).  Wheat germ, 1 oz (250 mg).  Winter squash,  cup (250 mg).  Yogurt, plain or fruited, 6 oz (260-435 mg).  Zucchini,  cup (220 mg). MODERATE IN POTASSIUM The following foods and beverages have 50-200 mg of potassium per serving:  Apple, 1 each (150 mg).  Apple juice,  cup (150 mg).  Applesauce,  cup (90 mg).  Apricot nectar,  cup (140 mg).  Asparagus, small spears,  cup or 6 spears (155 mg).  Bagel, cinnamon raisin, 1 each (130 mg).  Bagel,  egg or plain, 4 in., 1 each (70 mg).  Beans, green,  cup (90 mg).  Beans, yellow,  cup (190 mg).  Beer, regular, 12 oz (100 mg).  Beets, canned,  cup (125 mg).  Blackberries,  cup (115 mg).  Blueberries,  cup (60 mg).  Bread, whole wheat, 1 slice (70 mg).  Broccoli, raw,  cup (145 mg).  Cabbage,  cup (150 mg).  Carrots, cooked or raw,  cup (180 mg).  Cauliflower, raw,  cup (150 mg).  Celery, raw,  cup (155 mg).  Cereal, bran flakes, cup (120-150 mg).  Cheese, cottage,  cup (110 mg).  Cherries, 10 each (150 mg).  Chocolate, 1 oz bar (165 mg).  Coffee, brewed 6 oz (90 mg).  Corn,  cup or 1 ear (195 mg).  Cucumbers,  cup (80 mg).  Egg, large, 1 each (60 mg).  Eggplant,  cup (60 mg).  Endive, raw, cup (80 mg).  English muffin, 1 each (65 mg).  Fish, orange roughy, 3 oz (150 mg).  Frankfurter, beef or pork, 1 each (75 mg).  Fruit cocktail,  cup (115 mg).  Grape juice,  cup (170 mg).  Grapefruit,  fruit (175 mg).  Grapes,  cup (155 mg).  Greens: kale, turnip, collard,  cup (110-150 mg).  Ice cream or frozen yogurt, chocolate,  cup (175 mg).  Ice cream or frozen yogurt, vanilla,  cup (120-150 mg).  Lemons, limes, 1 each (80 mg).  Lettuce, all types, 1 cup (100 mg).  Mixed vegetables,  cup (150 mg).  Mushrooms, raw,  cup (110 mg).  Nuts: walnuts, pecans, or macadamia, 1 oz (125 mg).  Oatmeal,  cup (80 mg).  Okra,  cup (110 mg).  Onions, raw,  cup (120 mg).  Peach, 1 each (185 mg).  Peaches, canned,  cup (120 mg).  Pears, canned,  cup (120 mg).  Peas, green, frozen,  cup (90 mg).  Peppers, green,  cup (130 mg).  Peppers, red,  cup (160 mg).  Pineapple juice,  cup (165 mg).  Pineapple, fresh or canned,  cup (100 mg).  Plums, 1 each (105 mg).  Pudding, vanilla,  cup (150 mg).  Raspberries,  cup (90 mg).  Rhubarb,  cup (115 mg).  Rice, wild,  cup (80 mg).  Shrimp, 3 oz (155  mg).  Spinach, raw, 1 cup (170 mg).  Strawberries,  cup (125 mg).  Summer squash  cup (175-200 mg).  Swiss chard, raw, 1 cup (135 mg).  Tangerines, 1 each (140 mg).  Tea, brewed, 6 oz (65 mg).  Turnips,  cup (140 mg).  Watermelon,  cup (85 mg).  Wine, red, table,   5 oz (180 mg).  Wine, white, table, 5 oz (100 mg). LOW IN POTASSIUM The following foods and beverages have less than 50 mg of potassium per serving.  Bread, white, 1 slice (30 mg).  Carbonated beverages, 12 oz (less than 5 mg).  Cheese, 1 oz (20-30 mg).  Cranberries,  cup (45 mg).  Cranberry juice cocktail,  cup (20 mg).  Fats and oils, 1 Tbsp (less than 5 mg).  Hummus, 1 Tbsp (32 mg).  Nectar: papaya, mango, or pear,  cup (35 mg).  Rice, white or brown,  cup (50 mg).  Spaghetti or macaroni,  cup cooked (30 mg).  Tortilla, flour or corn, 1 each (50 mg).  Waffle, 4 in., 1 each (50 mg).  Water chestnuts,  cup (40 mg). Document Released: 03/02/2005 Document Revised: 07/24/2013 Document Reviewed: 06/15/2013 Exit[employer] Patient Information 2015 [employer]. This information is not intended to replace advice given to you by your health care provider. Make sure you discuss any questions you have with your health care provider. Hypokalemia Hypokalemia means that the amount of potassium in the blood is lower than normal.Potassium is a chemical, called an electrolyte, that helps regulate the amount of fluid in the body. It also stimulates muscle contraction and helps nerves function properly.Most of the body's potassium is inside of cells, and only a very small amount is in the blood. Because the amount in the blood is so small, minor changes can be life-threatening. CAUSES  Antibiotics.  Diarrhea or vomiting.  Using laxatives too much, which can cause diarrhea.  Chronic kidney disease.  Water pills (diuretics).  Eating disorders (bulimia).  Low magnesium level.  Sweating a lot. SIGNS  AND SYMPTOMS  Weakness.  Constipation.  Fatigue.  Muscle cramps.  Mental confusion.  Skipped heartbeats or irregular heartbeat (palpitations).  Tingling or numbness. DIAGNOSIS  Your health care provider can diagnose hypokalemia with blood tests. In addition to checking your potassium level, your health care provider may also check other lab tests. TREATMENT Hypokalemia can be treated with potassium supplements taken by mouth or adjustments in your current medicines. If your potassium level is very low, you may need to get potassium through a vein (IV) and be monitored in the hospital. A diet high in potassium is also helpful. Foods high in potassium are:  Nuts, such as peanuts and pistachios.  Seeds, such as sunflower seeds and pumpkin seeds.  Peas, lentils, and lima beans.  Whole grain and bran cereals and breads.  Fresh fruit and vegetables, such as apricots, avocado, bananas, cantaloupe, kiwi, oranges, tomatoes, asparagus, and potatoes.  Orange and tomato juices.  Red meats.  Fruit yogurt. HOME CARE INSTRUCTIONS  Take all medicines as prescribed by your health care provider.  Maintain a healthy diet by including nutritious food, such as fruits, vegetables, nuts, whole grains, and lean meats.  If you are taking a laxative, be sure to follow the directions on the label. SEEK MEDICAL CARE IF:  Your weakness gets worse.  You feel your heart pounding or racing.  You are vomiting or having diarrhea.  You are diabetic and having trouble keeping your blood glucose in the normal range. SEEK IMMEDIATE MEDICAL CARE IF:  You have chest pain, shortness of breath, or dizziness.  You are vomiting or having diarrhea for more than 2 days.  You faint. MAKE SURE YOU:   Understand these instructions.  Will watch your condition.  Will get help right away if you are not doing well or get worse. Document   Released: 07/19/2005 Document Revised: 05/09/2013 Document Reviewed:  01/19/2013 ExitCare Patient Information 2015 [employer]. This information is not intended to replace advice given to you by your health care provider. Make sure you discuss any questions you have with your health care provider.  

## 2014-11-15 NOTE — ED Notes (Addendum)
Pt reports having recent surgery and then had diarrhea, was + for cdiff. Pt was having n/v and still has some loose stools. Went to pcp today for re-eval and was called to come to ED due to low potassium of 2.1.

## 2014-11-15 NOTE — Telephone Encounter (Signed)
See result note.  

## 2014-11-15 NOTE — Telephone Encounter (Signed)
Per Artis Delay at [facility] pt's potassium is 2.1

## 2014-11-15 NOTE — Telephone Encounter (Signed)
Patient came to office for blood draw.

## 2014-11-15 NOTE — ED Provider Notes (Signed)
CSN: [id number]     Arrival date & time 11/15/14  1704 History   First MD Initiated Contact with Patient 11/15/14 1732     Chief Complaint  Patient presents with  . Abnormal Lab   HPI Pt has history of recent back surgery on the 15th of march that ended up getting complicated by c diff colitis.  She was in the hospital 15 days.  She has been home recovering and still having trouble with nausea, vomiting , and diarrhea.  Her symptoms have actually gotten much better in the last couple of days.  No vomiting today.  Just a couple loose stools today.  The patient has been dealing with hypokalemia. She has been taking oral potassium supplements. The patient had routine blood testing recently and was called by her doctor today because her potassium was 2.1. She was told to come to the emergency department. Patient otherwise denies any other complaints. Past Medical History  Diagnosis Date  . Allergy   . GERD (gastroesophageal reflux disease)   . Hypertension   . Thyroid disease   . Costochondritis   . S/P TKR (total knee replacement) 09  . S/P TAH (total abdominal hysterectomy)   . Total knee replacement status 09/2007    redo  . Diabetes mellitus without complication     type 2  . Shortness of breath     Hx: of with exertion  . Pneumonia     Hx: of  . Tingling     Hx: of in feet  . Arthritis     RA (Dr. Ouida Sills)  . Osteoarthritis   . Gastritis   . Colon polyps 2008    HYPERPLASTIC  . Chronic kidney disease     rt kidney removed   Past Surgical History  Procedure Laterality Date  . Abdominal hysterectomy  1978  . Total knee arthroplasty Right   . Hnp    . Nephrectomy Right 2010    10.rcc cancer  . Back surgery      x 2  . Colonoscopy w/ biopsies and polypectomy      Hx: of  . Lumbar laminectomy/decompression microdiscectomy Left 02/12/2013    Procedure: LUMBAR TWO THREE, LUMBAR THREE FOUR, LUMBAR FOUR FIVE  LAMINECTOMY/DECOMPRESSION MICRODISCECTOMY 3 LEVELS;  Surgeon: Charlie Pitter, MD;  Location: [facility];  Service: Neurosurgery;  Laterality: Left;  . Carpal tunnel release Left    Family History  Problem Relation Age of Onset  . Rheum arthritis Mother   . Diabetes Brother     x 2  . Colon cancer Neg Hx   . Prostate cancer Father   . Heart disease Father   . Esophageal cancer Neg Hx   . Pancreatic cancer Neg Hx   . Kidney disease Son   . Liver disease Neg Hx    History  Substance Use Topics  . Smoking status: Current Every Day Smoker -- 0.25 packs/day for 15 years    Types: Cigarettes  . Smokeless tobacco: Never Used     Comment: form given 08-31-13  . Alcohol Use: Yes     Comment: occ   OB History    No data available     Review of Systems  Constitutional: Negative for fever.  All other systems reviewed and are negative.     Allergies  Percocet and Aspirin  Home Medications   Prior to Admission medications   Medication Sig Start Date End Date Taking? Authorizing Provider  acetaminophen (TYLENOL) 500 MG  tablet Take 500 mg by mouth 2 (two) times daily as needed (pain).   Yes Historical Provider, MD  atorvastatin (LIPITOR) 20 MG tablet Take 1 tablet (20 mg total) by mouth daily. 12/18/13 12/18/14 Yes Dorena Cookey, MD  carvedilol (COREG) 3.125 MG tablet Take 1 tablet (3.125 mg total) by mouth 2 (two) times daily with a meal. 10/29/14  Yes Eugenie Filler, MD  cyclobenzaprine (FLEXERIL) 5 MG tablet Take 1 tablet (5 mg total) by mouth 3 (three) times daily as needed for muscle spasms. 09/03/14  Yes Dorena Cookey, MD  famotidine (PEPCID) 20 MG tablet Take 1 tablet (20 mg total) by mouth daily. 10/29/14  Yes Eugenie Filler, MD  fluticasone (FLONASE) 50 MCG/ACT nasal spray Place 2 sprays into the nose as needed for allergies.  12/16/11  Yes Dorena Cookey, MD  hydroxychloroquine (PLAQUENIL) 200 MG tablet Take 2 tablets (400 mg total) by mouth daily. Resume on 11/10/14. Take 2 by mouth daily 11/10/14  Yes Eugenie Filler, MD  levothyroxine  (SYNTHROID, LEVOTHROID) 50 MCG tablet Take 1 tablet (50 mcg total) by mouth daily. 12/18/13  Yes Dorena Cookey, MD  metFORMIN (GLUCOPHAGE) 500 MG tablet Take 1 tablet (500 mg total) by mouth daily with breakfast. Patient taking differently: Take 250 mg by mouth daily with breakfast.  12/18/13  Yes Dorena Cookey, MD  ondansetron (ZOFRAN-ODT) 4 MG disintegrating tablet Take 1 tablet (4 mg total) by mouth every 8 (eight) hours as needed for nausea or vomiting. 11/04/14  Yes Burnis Medin, MD  potassium chloride SA (K-DUR,KLOR-CON) 20 MEQ tablet Take 1 tablet (20 mEq total) by mouth daily. 11/06/14  Yes Burnis Medin, MD  vancomycin (VANCOCIN) 50 mg/mL oral solution Take 5 mLs (250 mg total) by mouth every 6 (six) hours. Take for 9 days then stop. 11/08/14 11/18/14 Yes Burnis Medin, MD  diazepam (VALIUM) 5 MG tablet Take 1-2 tablets (5-10 mg total) by mouth every 6 (six) hours as needed for muscle spasms. Patient not taking: Reported on 11/04/2014 10/29/14   Eugenie Filler, MD  glucose blood [facility] VERIO) test strip Use once daily for check glucose levels.  Dx E11.9 05/07/14   Dorena Cookey, MD  leflunomide (ARAVA) 10 MG tablet Take 2 tablets (20 mg total) by mouth daily. Resume on 11/10/14 Patient not taking: Reported on 11/04/2014 11/10/14   Eugenie Filler, MD  saccharomyces boulardii (FLORASTOR) 250 MG capsule Take 1 capsule (250 mg total) by mouth 2 (two) times daily. 11/06/14   Burnis Medin, MD  vancomycin (VANCOCIN) 125 MG capsule Take 3 times daily for one week and then 2 times daily for one week. 11/06/14   Burnis Medin, MD   BP 134/70 mmHg  Pulse 86  Temp(Src) 99 F (37.2 C) (Oral)  Resp 20  Ht 5\' 5"  (1.651 m)  Wt 179 lb (81.194 kg)  BMI 29.79 kg/m2  SpO2 100% Physical Exam  Constitutional: She appears well-developed and well-nourished. No distress.  HENT:  Head: Normocephalic and atraumatic.  Right Ear: External ear normal.  Left Ear: External ear normal.  Eyes: Conjunctivae are  normal. Right eye exhibits no discharge. Left eye exhibits no discharge. No scleral icterus.  Neck: Neck supple. No tracheal deviation present.  Cardiovascular: Normal rate, regular rhythm and intact distal pulses.   Pulmonary/Chest: Effort normal and breath sounds normal. No stridor. No respiratory distress. She has no wheezes. She has no rales.  Abdominal: Soft. Bowel sounds  are normal. She exhibits no distension. There is no tenderness. There is no rebound and no guarding.  Musculoskeletal: She exhibits no edema or tenderness.  Neurological: She is alert. She has normal strength. No cranial nerve deficit (no facial droop, extraocular movements intact, no slurred speech) or sensory deficit. She exhibits normal muscle tone. She displays no seizure activity. Coordination normal.  Skin: Skin is warm and dry. No rash noted.  Psychiatric: She has a normal mood and affect.  Nursing note and vitals reviewed.   ED Course  Procedures (including critical care time) Labs Review Labs Reviewed  CBC WITH DIFFERENTIAL/PLATELET - Abnormal; Notable for the following:    RBC 3.77 (*)    Hemoglobin 10.9 (*)    HCT 32.1 (*)    RDW 17.2 (*)    Monocytes Relative 14 (*)    Monocytes Absolute 1.1 (*)    All other components within normal limits  BASIC METABOLIC PANEL - Abnormal; Notable for the following:    Potassium 2.0 (*)    Chloride 95 (*)    CO2 34 (*)    Glucose, Bld 105 (*)    BUN 5 (*)    Creatinine, Ser 1.22 (*)    GFR calc non Af Amer 44 (*)    GFR calc Af Amer 51 (*)    All other components within normal limits  MAGNESIUM - Abnormal; Notable for the following:    Magnesium 1.4 (*)    All other components within normal limits    Imaging Review No results found.   EKG Interpretation   Date/Time:  Friday November 15 2014 17:54:22 EDT Ventricular Rate:  87 PR Interval:  150 QRS Duration: 86 QT Interval:  477 QTC Calculation: 574 R Axis:     Text Interpretation:  Sinus rhythm  Probable left atrial enlargement Low  voltage, precordial leads Prolonged QT interval , increased since last  tracing Confirmed by Chante Mayson  MD-J, Ponce Skillman ([id number]) on 11/15/2014 6:15:25 PM      MDM   Final diagnoses:  Hypokalemia  Hypomagnesemia    Patient's laboratory tests confirm persistent hypokalemia. She does also have a decreased magnesium level. The patient would prefer to go home. She is otherwise in no distress. Plan is IV potassium infusions in the emergency department and IV magnesium.  Pt was given 3 runs of potassium IV.  Also potassium po.  Plan on dc home.  Increase her potassium to 20 bid.  Follow up to have her potassium rechecked.      Dorie Rank, MD 11/15/14 [phone]

## 2014-11-21 NOTE — Progress Notes (Signed)
Cardiology Office Note   Date:  11/22/2014   ID:  [name], DOB [birth year], MRN [id number]  PCP:  Joycelyn Man, MD  Cardiologist:  Dr. Cleatis Polka     Chief Complaint  Patient presents with  . Hospitalization Follow-up    Chest pain     History of Present Illness: [name] is a 69 y.o. female with a hx of HTN, DM2, CKD with solitary kidney (nephrectomy 2/2 renal CA), Rheumatoid Arthritis.  She had recent admission 3/15-3/29 for lumbar spine surgery complicated by CDiff colitis and AKI.  The patient had chest discomfort on March 20 or March 21. She was seen by Dr. Cleatis Polka.  EKGs on March 21revealed diffuse J-point elevation. EKG the following day showed T-wave inversions that persisted. The first troponins were abnormal with a value of 0.06.  These trended down to 0.04.  Initially it was questioned if symptoms and ECG changes suggested pericarditis.  Echo demonstrated normal LVF with no RWMA.  Etiology of CP was not clear.  OP FU with Cardiology recommended with +/- stress testing.  She returns for FU.    She is still recovering from her CDiff.  She has had significant N/V since DC.  She is just now getting back to where she can eat more.  She is having a lot of night sweats.  She had a very low K+ last week.  She had to go to the ED for replacement.  She denies chest pain. She is working with PT. She is still weak.  She denies significant DOE.  She denies orthopnea, PND. She had LE edema after DC and this has resolved.  She denies syncope.     Studies/Reports Reviewed Today:  Echo 10/26/14 - Mild concentric hypertrophy. Systolic function was vigorous. EF 65% to 70%. Wall motion was normal.  Grade 1 diastolic dysfunction. - Tricuspid valve: There was mild regurgitation.   Past Medical History  Diagnosis Date  . Allergy   . GERD (gastroesophageal reflux disease)   . Hypertension   . Thyroid disease   . Costochondritis   . S/P TKR (total knee replacement) 09  .  S/P TAH (total abdominal hysterectomy)   . Total knee replacement status 09/2007    redo  . Diabetes mellitus without complication     type 2  . Shortness of breath     Hx: of with exertion  . Pneumonia     Hx: of  . Tingling     Hx: of in feet  . Arthritis     RA (Dr. Ouida Sills)  . Osteoarthritis   . Gastritis   . Colon polyps 2008    HYPERPLASTIC  . Chronic kidney disease     rt kidney removed    Past Surgical History  Procedure Laterality Date  . Abdominal hysterectomy  1978  . Total knee arthroplasty Right   . Hnp    . Nephrectomy Right 2010    10.rcc cancer  . Back surgery      x 2  . Colonoscopy w/ biopsies and polypectomy      Hx: of  . Lumbar laminectomy/decompression microdiscectomy Left 02/12/2013    Procedure: LUMBAR TWO THREE, LUMBAR THREE FOUR, LUMBAR FOUR FIVE  LAMINECTOMY/DECOMPRESSION MICRODISCECTOMY 3 LEVELS;  Surgeon: Charlie Pitter, MD;  Location: [facility];  Service: Neurosurgery;  Laterality: Left;  . Carpal tunnel release Left      Current Outpatient Prescriptions  Medication Sig Dispense Refill  . acetaminophen (TYLENOL)  500 MG tablet Take 500 mg by mouth 2 (two) times daily as needed (pain).    [name] Kitchen atorvastatin (LIPITOR) 20 MG tablet Take 1 tablet (20 mg total) by mouth daily. 100 tablet 3  . carvedilol (COREG) 3.125 MG tablet Take 1 tablet (3.125 mg total) by mouth 2 (two) times daily with a meal. 62 tablet 0  . cyclobenzaprine (FLEXERIL) 5 MG tablet Take 1 tablet (5 mg total) by mouth 3 (three) times daily as needed for muscle spasms. 30 tablet 1  . famotidine (PEPCID) 20 MG tablet Take 1 tablet (20 mg total) by mouth daily. 30 tablet 0  . fluticasone (FLONASE) 50 MCG/ACT nasal spray Place 2 sprays into the nose as needed for allergies.     [name] Kitchen glucose blood (ONETOUCH VERIO) test strip Use once daily for check glucose levels.  Dx E11.9 100 each 12  . hydroxychloroquine (PLAQUENIL) 200 MG tablet Take 2 tablets (400 mg total) by mouth daily. Resume on  11/10/14. Take 2 by mouth daily    . leflunomide (ARAVA) 10 MG tablet Take 2 tablets (20 mg total) by mouth daily. Resume on 11/10/14    . levothyroxine (SYNTHROID, LEVOTHROID) 50 MCG tablet Take 1 tablet (50 mcg total) by mouth daily. 100 tablet 3  . losartan-hydrochlorothiazide (HYZAAR) 100-12.5 MG per tablet Take 1 tablet by mouth daily.    . metFORMIN (GLUCOPHAGE) 500 MG tablet Take 1 tablet (500 mg total) by mouth daily with breakfast. (Patient taking differently: Take 250 mg by mouth daily with breakfast. ) 100 tablet 3  . ondansetron (ZOFRAN-ODT) 4 MG disintegrating tablet Take 1 tablet (4 mg total) by mouth every 8 (eight) hours as needed for nausea or vomiting. 20 tablet 0  . potassium chloride SA (K-DUR,KLOR-CON) 20 MEQ tablet Take 1 tablet (20 mEq total) by mouth 2 (two) times daily. 30 tablet 0  . Probiotic Product (PROBIOTIC DAILY PO) Take by mouth daily.    [name] Kitchen saccharomyces boulardii (FLORASTOR) 250 MG capsule Take 1 capsule (250 mg total) by mouth 2 (two) times daily. 60 capsule 0  . vancomycin (VANCOCIN) 125 MG capsule Take 3 times daily for one week and then 2 times daily for one week. 35 capsule 0   No current facility-administered medications for this visit.    Allergies:   Percocet and Aspirin    Social History:  The patient  reports that she has been smoking Cigarettes.  She has a 3.75 pack-year smoking history. She has never used smokeless tobacco. She reports that she drinks alcohol. She reports that she does not use illicit drugs.   Family History:  The patient's family history includes Diabetes in her brother; Heart disease in her father; Kidney disease in her son; Prostate cancer in her father; Rheum arthritis in her mother; Stroke in her mother. There is no history of Colon cancer, Esophageal cancer, Pancreatic cancer, or Liver disease.    ROS:   Please see the history of present illness.   Review of Systems  Constitution: Positive for chills, diaphoresis and weight  loss.  Eyes: Positive for visual disturbance.  Respiratory: Positive for cough.   Skin: Positive for dry skin.  Gastrointestinal: Positive for diarrhea, nausea and vomiting.  Neurological: Positive for loss of balance.  Psychiatric/Behavioral: Positive for depression.  All other systems reviewed and are negative.     PHYSICAL EXAM: VS:  BP 110/80 mmHg  Pulse 99  Ht 5\' 6"  (1.676 m)  Wt 182 lb (82.555 kg)  BMI 29.39 kg/m2  Wt Readings from Last 3 Encounters:  11/22/14 182 lb (82.555 kg)  11/15/14 179 lb (81.194 kg)  11/04/14 188 lb (85.276 kg)     GEN: Well nourished, well developed, in no acute distress HEENT: normal Neck: no JVD,  no masses Cardiac:  Normal S1/S2, RRR; no murmur, no rubs or gallops, no edema  Respiratory:  clear to auscultation bilaterally, no wheezing, rhonchi or rales. GI: soft, nontender, nondistended, + BS MS: no deformity or atrophy Skin: warm and dry  Neuro:  CNs II-XII intact, Strength and sensation are intact Psych: Normal affect   EKG:  EKG is ordered today.  It demonstrates:   NSR, HR 99, normal axis, NSSTTW changes, no change from prior tracing   Recent Labs: 12/11/2013: ALT 23; TSH 1.79 11/15/2014: BUN 5*; Creatinine 1.22*; Hemoglobin 10.9*; Magnesium 1.4*; Platelets 386; Potassium 2.0*; Sodium 140    Lipid Panel    Component Value Date/Time   CHOL 181 12/11/2013 0905   TRIG 128.0 12/11/2013 0905   HDL 63.10 12/11/2013 0905   CHOLHDL 3 12/11/2013 0905   VLDL 25.6 12/11/2013 0905   LDLCALC 92 12/11/2013 0905   LDLDIRECT 171.5 12/06/2011 0812    Recent Labs  10/25/14 1315 10/25/14 1857 10/26/14 0057  TROPONINI 0.06* 0.07* 0.04*      ASSESSMENT AND PLAN:  Chest pain with Elevated troponin This was likely demand ischemia in the setting of ARF (Creatinine 4.74) and CDiff colitis after recent back surgery.  Echo demonstrated normal LVF and no RWMA.  Troponins were minimally elevated in a flat trend.  However, she does have a  lot of risk factors (ex-smoker, diabetes, HTN, HL).  I will arrange a Lexiscan Myoview to rule out ischemia.  This will be arranged in several weeks to allow recovery from her recent surgery, illness.  Essential hypertension Controlled.   HLD (hyperlipidemia) Continue statin  CKD (chronic kidney disease), unspecified stage  Recent Creatinine improved.  K+ low and this was replaced.  She has FU with primary care.   Current medicines are reviewed at length with the patient today.  Concerns regarding medicines are as outlined above.  The following changes have been made:    None    Labs/ tests ordered today include:  Orders Placed This Encounter  Procedures  . EKG 12-Lead    Disposition:   FU with Dr. Cleatis Polka 2-3 mos.   Signed, Versie Starks, MHS 11/22/2014 12:55 PM    [facility] [place], [place], [place]  [address] Phone: [phone]; Fax: [phone]

## 2014-11-22 ENCOUNTER — Encounter: Payer: Self-pay | Admitting: Physician Assistant

## 2014-11-22 ENCOUNTER — Ambulatory Visit (INDEPENDENT_AMBULATORY_CARE_PROVIDER_SITE_OTHER): Payer: Medicare HMO | Admitting: Physician Assistant

## 2014-11-22 VITALS — BP 110/80 | HR 99 | Ht 66.0 in | Wt 182.0 lb

## 2014-11-22 DIAGNOSIS — I10 Essential (primary) hypertension: Secondary | ICD-10-CM

## 2014-11-22 DIAGNOSIS — E785 Hyperlipidemia, unspecified: Secondary | ICD-10-CM

## 2014-11-22 DIAGNOSIS — R7989 Other specified abnormal findings of blood chemistry: Secondary | ICD-10-CM

## 2014-11-22 DIAGNOSIS — R778 Other specified abnormalities of plasma proteins: Secondary | ICD-10-CM

## 2014-11-22 DIAGNOSIS — N189 Chronic kidney disease, unspecified: Secondary | ICD-10-CM

## 2014-11-22 DIAGNOSIS — R079 Chest pain, unspecified: Secondary | ICD-10-CM

## 2014-11-22 NOTE — Patient Instructions (Signed)
Medication Instructions:  Your physician recommends that you continue on your current medications as directed. Please refer to the Current Medication list given to you today.  Labwork: NONE  Testing/Procedures: Your physician has requested that you have a lexiscan myoview. For further information please visit [email or web address]. Please follow instruction sheet, as given. NEEDS TO BE SCHEDULED IN ABOUT 1 MONTH  Follow-Up: Your physician recommends that you schedule a follow-up appointment in: 2-3 MONTHS WITH DR Ron Parker

## 2014-11-22 NOTE — Addendum Note (Signed)
Addended by: Alvina Filbert B on: 11/22/2014 01:39 PM   Modules accepted: Orders

## 2014-12-04 ENCOUNTER — Encounter: Payer: Self-pay | Admitting: Internal Medicine

## 2014-12-04 ENCOUNTER — Ambulatory Visit (INDEPENDENT_AMBULATORY_CARE_PROVIDER_SITE_OTHER): Payer: Medicare HMO | Admitting: Internal Medicine

## 2014-12-04 VITALS — BP 127/85 | HR 87 | Temp 98.2°F | Ht 66.0 in | Wt 186.0 lb

## 2014-12-04 DIAGNOSIS — Z8719 Personal history of other diseases of the digestive system: Secondary | ICD-10-CM | POA: Diagnosis not present

## 2014-12-04 DIAGNOSIS — IMO0001 Reserved for inherently not codable concepts without codable children: Secondary | ICD-10-CM

## 2014-12-04 NOTE — Progress Notes (Signed)
Subjective:    Patient ID: [name], female    DOB: [birth year], 69 y.o.   MRN: [id number]  HPI [name] is a 69yo F who underwent lumbar L4-5 lumbar fusion surgery but was complicated by C.difficile which was treated with oral vancomycin and flagyl. She sustained weight loss and loss of appetite during this illness course, had mild AKI associated with volume loss due to perfuse diarrhea. She began to regain appetite and slowly started ambulating with PT and OT. She was ultimately deemed stable for d/c with Home health PT after a 15 day hospitalization course. Due to protracted course of CDI she was discharged on a vancomycin taper, which she recently finished a few weeks ago. Since then, she roughly has 2-3 loose stools per day but feel that the consistency is improving. She is gaining back weight and strength from her recent hospitalization. Her surgical incision is well healed she reports.  Current Outpatient Prescriptions on File Prior to Visit  Medication Sig Dispense Refill  . acetaminophen (TYLENOL) 500 MG tablet Take 500 mg by mouth 2 (two) times daily as needed (pain).    [name] Kitchen atorvastatin (LIPITOR) 20 MG tablet Take 1 tablet (20 mg total) by mouth daily. 100 tablet 3  . carvedilol (COREG) 3.125 MG tablet Take 1 tablet (3.125 mg total) by mouth 2 (two) times daily with a meal. 62 tablet 0  . cyclobenzaprine (FLEXERIL) 5 MG tablet Take 1 tablet (5 mg total) by mouth 3 (three) times daily as needed for muscle spasms. 30 tablet 1  . famotidine (PEPCID) 20 MG tablet Take 1 tablet (20 mg total) by mouth daily. 30 tablet 0  . fluticasone (FLONASE) 50 MCG/ACT nasal spray Place 2 sprays into the nose as needed for allergies.     [name] Kitchen glucose blood (ONETOUCH VERIO) test strip Use once daily for check glucose levels.  Dx E11.9 100 each 12  . hydroxychloroquine (PLAQUENIL) 200 MG tablet Take 2 tablets (400 mg total) by mouth daily. Resume on 11/10/14. Take 2 by mouth daily    . levothyroxine (SYNTHROID,  LEVOTHROID) 50 MCG tablet Take 1 tablet (50 mcg total) by mouth daily. 100 tablet 3  . losartan-hydrochlorothiazide (HYZAAR) 100-12.5 MG per tablet Take 1 tablet by mouth daily.    . metFORMIN (GLUCOPHAGE) 500 MG tablet Take 1 tablet (500 mg total) by mouth daily with breakfast. (Patient taking differently: Take 250 mg by mouth daily with breakfast. ) 100 tablet 3  . ondansetron (ZOFRAN-ODT) 4 MG disintegrating tablet Take 1 tablet (4 mg total) by mouth every 8 (eight) hours as needed for nausea or vomiting. 20 tablet 0  . Probiotic Product (PROBIOTIC DAILY PO) Take by mouth daily.    [name] Kitchen saccharomyces boulardii (FLORASTOR) 250 MG capsule Take 1 capsule (250 mg total) by mouth 2 (two) times daily. 60 capsule 0  . vancomycin (VANCOCIN) 125 MG capsule Take 3 times daily for one week and then 2 times daily for one week. 35 capsule 0  . potassium chloride SA (K-DUR,KLOR-CON) 20 MEQ tablet Take 1 tablet (20 mEq total) by mouth 2 (two) times daily. (Patient not taking: Reported on 12/04/2014) 30 tablet 0   No current facility-administered medications on file prior to visit.     Review of Systems     Objective:   Physical Exam  BP 127/85 mmHg  Pulse 87  Temp(Src) 98.2 F (36.8 C) (Oral)  Ht 5\' 6"  (1.676 m)  Wt 186 lb (84.369 kg)  BMI 30.04 kg/m2 Physical  Exam  Constitutional:  oriented to person, place, and time. appears well-developed and well-nourished. No distress. Wearing back brace HENT: [place]/AT, PERRLA, no scleral icterus Mouth/Throat: Oropharynx is clear and moist. No oropharyngeal exudate.  Cardiovascular: Normal rate, regular rhythm and normal heart sounds. Exam reveals no gallop and no friction rub.  No murmur heard.  Pulmonary/Chest: Effort normal and breath sounds normal. No respiratory distress.  has no wheezes.  Neck = supple, no nuchal rigidity Lymphadenopathy: no cervical adenopathy. No axillary adenopathy Skin: Skin is warm and dry. No rash noted. No erythema.  Psychiatric: a  normal mood and affect.  behavior is normal.   Labs: BMET    Component Value Date/Time   NA 140 11/15/2014 1800   K 2.0* 11/15/2014 1800   CL 95* 11/15/2014 1800   CO2 34* 11/15/2014 1800   GLUCOSE 105* 11/15/2014 1800   BUN 5* 11/15/2014 1800   CREATININE 1.22* 11/15/2014 1800   CALCIUM 8.4 11/15/2014 1800   GFRNONAA 44* 11/15/2014 1800   GFRAA 51* 11/15/2014 1800    Lab Results  Component Value Date   WBC 7.6 11/15/2014   HGB 10.9* 11/15/2014   HCT 32.1* 11/15/2014   MCV 85.1 11/15/2014   PLT 386 11/15/2014        Assessment & Plan:  Clostridium difficile colitis = it appears by history that her first episode of c.difficile was moderately severe with weight loss, aki but now improved after finishing vancomycin taper. For now, would continue to watch for recurrence. Avoid unnecessary antibiotics. Will not pursue fecal transplant at this time, unless she has 2 addn recurrences.  Elzie Rings [facility] [phone]

## 2014-12-16 ENCOUNTER — Other Ambulatory Visit (INDEPENDENT_AMBULATORY_CARE_PROVIDER_SITE_OTHER): Payer: Medicare HMO

## 2014-12-16 DIAGNOSIS — E139 Other specified diabetes mellitus without complications: Secondary | ICD-10-CM

## 2014-12-16 DIAGNOSIS — Z23 Encounter for immunization: Secondary | ICD-10-CM

## 2014-12-16 LAB — POCT URINALYSIS DIPSTICK
Bilirubin, UA: NEGATIVE
Blood, UA: NEGATIVE
Glucose, UA: NEGATIVE
Ketones, UA: NEGATIVE
Leukocytes, UA: NEGATIVE
Nitrite, UA: NEGATIVE
Spec Grav, UA: 1.025
Urobilinogen, UA: 0.2
pH, UA: 5.5

## 2014-12-16 LAB — HEMOGLOBIN A1C: Hgb A1c MFr Bld: 5 % (ref 4.6–6.5)

## 2014-12-16 LAB — CBC WITH DIFFERENTIAL/PLATELET
Basophils Absolute: 0.1 10*3/uL (ref 0.0–0.1)
Basophils Relative: 1.6 % (ref 0.0–3.0)
Eosinophils Absolute: 0.4 10*3/uL (ref 0.0–0.7)
Eosinophils Relative: 8.9 % — ABNORMAL HIGH (ref 0.0–5.0)
HCT: 35.3 % — ABNORMAL LOW (ref 36.0–46.0)
Hemoglobin: 11.8 g/dL — ABNORMAL LOW (ref 12.0–15.0)
Lymphocytes Relative: 50.1 % — ABNORMAL HIGH (ref 12.0–46.0)
Lymphs Abs: 2.5 10*3/uL (ref 0.7–4.0)
MCHC: 33.4 g/dL (ref 30.0–36.0)
MCV: 88.3 fl (ref 78.0–100.0)
Monocytes Absolute: 0.5 10*3/uL (ref 0.1–1.0)
Monocytes Relative: 10.2 % (ref 3.0–12.0)
Neutro Abs: 1.4 10*3/uL (ref 1.4–7.7)
Neutrophils Relative %: 29.2 % — ABNORMAL LOW (ref 43.0–77.0)
Platelets: 223 10*3/uL (ref 150.0–400.0)
RBC: 4 Mil/uL (ref 3.87–5.11)
RDW: 16.8 % — ABNORMAL HIGH (ref 11.5–15.5)
WBC: 4.9 10*3/uL (ref 4.0–10.5)

## 2014-12-16 LAB — BASIC METABOLIC PANEL
BUN: 23 mg/dL (ref 6–23)
CO2: 28 mEq/L (ref 19–32)
Calcium: 10 mg/dL (ref 8.4–10.5)
Chloride: 105 mEq/L (ref 96–112)
Creatinine, Ser: 1.05 mg/dL (ref 0.40–1.20)
GFR: 66.46 mL/min (ref >60.00)
Glucose, Bld: 91 mg/dL (ref 70–99)
Potassium: 4.4 mEq/L (ref 3.5–5.1)
Sodium: 139 mEq/L (ref 135–145)

## 2014-12-16 LAB — HEPATIC FUNCTION PANEL
ALT: 9 U/L (ref 0–35)
AST: 17 U/L (ref 0–37)
Albumin: 3.7 g/dL (ref 3.5–5.2)
Alkaline Phosphatase: 82 U/L (ref 39–117)
Bilirubin, Direct: 0 mg/dL (ref 0.0–0.3)
Total Bilirubin: 0.3 mg/dL (ref 0.2–1.2)
Total Protein: 7.8 g/dL (ref 6.0–8.3)

## 2014-12-16 LAB — MICROALBUMIN / CREATININE URINE RATIO
Creatinine,U: 103.3 mg/dL
Microalb Creat Ratio: 9.7 mg/g (ref 0.0–30.0)
Microalb, Ur: 10 mg/dL — ABNORMAL HIGH (ref 0.0–1.9)

## 2014-12-16 LAB — LIPID PANEL
Cholesterol: 213 mg/dL — ABNORMAL HIGH (ref 0–200)
HDL: 73.4 mg/dL (ref >39.00)
LDL Cholesterol: 120 mg/dL — ABNORMAL HIGH (ref 0–99)
NonHDL: 139.6
Total CHOL/HDL Ratio: 3
Triglycerides: 99 mg/dL (ref 0.0–149.0)
VLDL: 19.8 mg/dL (ref 0.0–40.0)

## 2014-12-16 LAB — TSH: TSH: 2.07 u[IU]/mL (ref 0.35–4.50)

## 2014-12-23 ENCOUNTER — Ambulatory Visit (INDEPENDENT_AMBULATORY_CARE_PROVIDER_SITE_OTHER): Payer: Medicare HMO | Admitting: Family Medicine

## 2014-12-23 ENCOUNTER — Encounter: Payer: Self-pay | Admitting: Family Medicine

## 2014-12-23 ENCOUNTER — Other Ambulatory Visit: Payer: Self-pay | Admitting: Family Medicine

## 2014-12-23 ENCOUNTER — Telehealth (HOSPITAL_COMMUNITY): Payer: Self-pay

## 2014-12-23 VITALS — Temp 98.0°F | Ht 66.0 in | Wt 192.0 lb

## 2014-12-23 DIAGNOSIS — E039 Hypothyroidism, unspecified: Secondary | ICD-10-CM | POA: Diagnosis not present

## 2014-12-23 DIAGNOSIS — I10 Essential (primary) hypertension: Secondary | ICD-10-CM | POA: Diagnosis not present

## 2014-12-23 DIAGNOSIS — E785 Hyperlipidemia, unspecified: Secondary | ICD-10-CM | POA: Diagnosis not present

## 2014-12-23 DIAGNOSIS — E139 Other specified diabetes mellitus without complications: Secondary | ICD-10-CM

## 2014-12-23 MED ORDER — ATORVASTATIN CALCIUM 20 MG PO TABS: 20.0000 mg | ORAL_TABLET | Freq: Every day | ORAL | Status: DC

## 2014-12-23 MED ORDER — AMLODIPINE BESYLATE 2.5 MG PO TABS: 2.5000 mg | ORAL_TABLET | Freq: Every day | ORAL | Status: DC

## 2014-12-23 MED ORDER — LOSARTAN POTASSIUM-HCTZ 100-12.5 MG PO TABS: 1.0000 | ORAL_TABLET | Freq: Every day | ORAL | Status: DC

## 2014-12-23 MED ORDER — CARVEDILOL 3.125 MG PO TABS: 3.1250 mg | ORAL_TABLET | Freq: Two times a day (BID) | ORAL | Status: DC

## 2014-12-23 MED ORDER — METFORMIN HCL 500 MG PO TABS: ORAL_TABLET | ORAL | Status: DC

## 2014-12-23 MED ORDER — LEVOTHYROXINE SODIUM 50 MCG PO TABS: 50.0000 ug | ORAL_TABLET | Freq: Every day | ORAL | Status: DC

## 2014-12-23 MED ORDER — CYCLOBENZAPRINE HCL 5 MG PO TABS: 5.0000 mg | ORAL_TABLET | Freq: Three times a day (TID) | ORAL | Status: DC | PRN

## 2014-12-23 NOTE — Patient Instructions (Addendum)
Stop the Lipitor because of the muscle discomfort........ discuss this with Tommi Rumps when you come back in a month  Add Norvasc 2.5 mg........[name] Kitchen 1 daily in the morning  Check your blood pressure daily in the morning  Return in one month for follow-up......[name] Kitchen bring all the data and the device......... Cory nafzinger  Rachel's extension is [id number]  Congratulations on no smoking,,,,,,,,,,,,,, keep up the good work  Metformin 500 mg.......... one half tab daily in the morning...[name] KitchenMarland KitchenMarland Kitchen follow-up A1c in 6 months

## 2014-12-23 NOTE — Progress Notes (Signed)
Subjective:    Patient ID: [name], female    DOB: Nov 15, 1945, 69 y.o.   MRN: [id number]  HPI [name] is a 69 year old female ex-smoker 4 months,,,,,, who comes in today for general physical examination because of the history of the metabolic syndrome. Obesity, hyperlipidemia, diabetes, high cholesterol  She says she's doing well since she had her back surgery. She had disc surgery subsequent complications of C. difficile colitis. Had to be readmitted and put on vancomycin. She's been recovering slowly. During that process she quit smoking completely.  She's also complaining of muscle aches. She's taken Flexeril 5 mg twice a day. I wonder if it might be the Lipitor. Her boluses otherwise correct. She also sees Dr. Ouida Sills rheumatology for arthritis and is on Plaquenil.  She gets routine eye care, dental care, BSE monthly, and you mammography, due a colonoscopy. The computer says she had one on May 4 of this year but that's wrong  Vaccinations up-to-date  Recent Hardy gram by cardiology normal therefore not repeated   Review of Systems  Constitutional: Negative.   HENT: Negative.   Eyes: Negative.   Respiratory: Negative.   Cardiovascular: Negative.   Gastrointestinal: Negative.   Endocrine: Negative.   Genitourinary: Negative.   Musculoskeletal: Negative.   Skin: Negative.   Allergic/Immunologic: Negative.   Neurological: Negative.   Hematological: Negative.   Psychiatric/Behavioral: Negative.        Objective:   Physical Exam  Constitutional: She is oriented to person, place, and time. She appears well-developed and well-nourished.  HENT:  Head: Normocephalic and atraumatic.  Right Ear: External ear normal.  Left Ear: External ear normal.  Nose: Nose normal.  Mouth/Throat: Oropharynx is clear and moist.  Eyes: EOM are normal. Pupils are equal, round, and reactive to light.  Neck: Normal range of motion. Neck supple. No JVD present. No tracheal deviation present. No  thyromegaly present.  Cardiovascular: Normal rate, regular rhythm, normal heart sounds and intact distal pulses.  Exam reveals no gallop and no friction rub.   No murmur heard. Pulmonary/Chest: Effort normal and breath sounds normal. No stridor. No respiratory distress. She has no wheezes. She has no rales. She exhibits no tenderness.  Abdominal: Soft. Bowel sounds are normal. She exhibits no distension and no mass. There is no tenderness. There is no rebound and no guarding.  Genitourinary:  Bilateral breast exam normal. She had her uterus removed 20 years ago for nonmalignant reasons GYN wise asymptomatic therefore pelvic not indicated  Musculoskeletal: Normal range of motion.  Lymphadenopathy:    She has no cervical adenopathy.  Neurological: She is alert and oriented to person, place, and time. She has normal reflexes. No cranial nerve deficit. She exhibits normal muscle tone. Coordination normal.  Skin: Skin is warm and dry. No rash noted. No erythema. No pallor.  Psychiatric: She has a normal mood and affect. Her behavior is normal. Judgment and thought content normal.  Nursing note and vitals reviewed.         Assessment & Plan:  Hypertension........... not at goal..... She tells me the home health care nurses beginning elevated numbers 2......... add Norvasc 2.5 mg........... BP check daily follow-up in one month  Hyperlipidemia............ hold Lipitor for 1 month because of the muscle and joint pain  Continue thyroid medication  Continue the metformin......... follow-up in 6 months since your A1c is normal 5.0%  Status post right total knee replacement  Status post lumbar disc surgery complicated by C. difficile colitis

## 2014-12-23 NOTE — Telephone Encounter (Signed)
Patient's Daughter Rylinn Linzy) on patient's DPR given detailed instructions per Myocardial Perfusion Study Information Sheet for test on 12-24-2014 at 7:00am. Patient's daughter verbalized understanding. Oletta Lamas, Kaniyah Lisby A

## 2014-12-23 NOTE — Progress Notes (Signed)
Pre visit review using our clinic review tool, if applicable. No additional management support is needed unless otherwise documented below in the visit note. 

## 2014-12-24 ENCOUNTER — Encounter (HOSPITAL_COMMUNITY): Payer: Medicare HMO

## 2014-12-25 ENCOUNTER — Telehealth: Payer: Self-pay | Admitting: Family Medicine

## 2014-12-25 MED ORDER — AMLODIPINE BESYLATE 2.5 MG PO TABS: 2.5000 mg | ORAL_TABLET | Freq: Every day | ORAL | Status: DC

## 2014-12-25 NOTE — Telephone Encounter (Signed)
Patient states amLODipine (NORVASC) 2.5 MG tablet was sent into the wrong pharmacy.  She needs it sent to [id number] - [place], [place]

## 2014-12-25 NOTE — Telephone Encounter (Signed)
Rx sent to correct pharmacy.

## 2015-01-08 ENCOUNTER — Telehealth (HOSPITAL_COMMUNITY): Payer: Self-pay | Admitting: *Deleted

## 2015-01-08 NOTE — Telephone Encounter (Signed)
Patient given detailed instructions per Myocardial Perfusion Study Information Sheet for test on 01/10/15 at 7:45 Patient verbalized understanding. Hubbard Robinson, RN

## 2015-01-10 ENCOUNTER — Encounter: Payer: Self-pay | Admitting: Physician Assistant

## 2015-01-10 ENCOUNTER — Ambulatory Visit (HOSPITAL_COMMUNITY): Payer: Medicare HMO | Attending: Physician Assistant

## 2015-01-10 DIAGNOSIS — R079 Chest pain, unspecified: Secondary | ICD-10-CM | POA: Insufficient documentation

## 2015-01-10 LAB — MYOCARDIAL PERFUSION IMAGING
LV dias vol: 57 mL
LV sys vol: 15 mL
Nuc Stress EF: 73 %
Peak HR: 112 {beats}/min
RATE: 0.23
Rest HR: 75 {beats}/min
SDS: 1
SRS: 6
SSS: 7
TID: 0.97

## 2015-01-10 MED ORDER — TECHNETIUM TC 99M SESTAMIBI GENERIC - CARDIOLITE
11.0000 | Freq: Once | INTRAVENOUS | Status: AC | PRN
  Administered 2015-01-10: 11 via INTRAVENOUS

## 2015-01-10 MED ORDER — TECHNETIUM TC 99M SESTAMIBI GENERIC - CARDIOLITE
33.0000 | Freq: Once | INTRAVENOUS | Status: AC | PRN
  Administered 2015-01-10: 33 via INTRAVENOUS

## 2015-01-10 MED ORDER — REGADENOSON 0.4 MG/5ML IV SOLN
0.4000 mg | Freq: Once | INTRAVENOUS | Status: AC
  Administered 2015-01-10: 0.4 mg via INTRAVENOUS

## 2015-01-13 ENCOUNTER — Telehealth: Payer: Self-pay | Admitting: *Deleted

## 2015-01-13 NOTE — Telephone Encounter (Signed)
pt notified of myoview results with verbal understanding

## 2015-01-23 ENCOUNTER — Encounter: Payer: Self-pay | Admitting: Adult Health

## 2015-01-23 ENCOUNTER — Ambulatory Visit (INDEPENDENT_AMBULATORY_CARE_PROVIDER_SITE_OTHER): Payer: Medicare HMO | Admitting: Adult Health

## 2015-01-23 VITALS — BP 130/82 | Temp 98.3°F | Ht 66.0 in | Wt 193.6 lb

## 2015-01-23 DIAGNOSIS — I10 Essential (primary) hypertension: Secondary | ICD-10-CM

## 2015-01-23 NOTE — Progress Notes (Signed)
Subjective:    Patient ID: [name], female    DOB: 1946/05/26, 69 y.o.   MRN: [id number]  HPI Patient presents to the office for one month follow - up regarding blood pressure and muscle pain.  During her last visit she was started on Norvasc 2.5 mg by MD Sherren Mocha due to uncontrolled blood pressure. She was also told to take a holiday from her statin due to it being a possible cause of her muscle aches.   Her blood pressure seems more well controlled on the Norvasc. She has no complaints of dizziness or lightheadedness.   She has restarted her Lipitor as she did not notice a difference when she was off it, she continued to have pain, to which she believes is her RA.    She has no other complaints at this time.   Review of Systems  Constitutional: Negative.   Cardiovascular: Negative.   Gastrointestinal: Negative.   Endocrine: Negative.   Musculoskeletal: Positive for myalgias and arthralgias. Negative for joint swelling, neck pain and neck stiffness.  Skin: Negative.   Neurological: Negative.   Psychiatric/Behavioral: Negative.   All other systems reviewed and are negative.  Past Medical History  Diagnosis Date  . Allergy   . GERD (gastroesophageal reflux disease)   . Hypertension   . Thyroid disease   . Costochondritis   . S/P TKR (total knee replacement) 09  . S/P TAH (total abdominal hysterectomy)   . Total knee replacement status 09/2007    redo  . Diabetes mellitus without complication     type 2  . Shortness of breath     Hx: of with exertion  . Pneumonia     Hx: of  . Tingling     Hx: of in feet  . Arthritis     RA (Dr. Ouida Sills)  . Osteoarthritis   . Gastritis   . Colon polyps 2008    HYPERPLASTIC  . Chronic kidney disease     rt kidney removed  . Hx of cardiovascular stress test     Lexiscan Myoview 6/16:  EF 70%, no scar or ischemia; Low Risk    History   Social History  . Marital Status: Divorced    Spouse Name: N/A  . Number of Children: 3  .  Years of Education: N/A   Occupational History  . Retired    Social History Main Topics  . Smoking status: Former Smoker -- 0.25 packs/day for 15 years    Types: Cigarettes  . Smokeless tobacco: Never Used     Comment: quit for surgery - congratulated!  . Alcohol Use: 0.0 oz/week    0 Standard drinks or equivalent per week     Comment: occ  . Drug Use: No  . Sexual Activity: Not on file   Other Topics Concern  . Not on file   Social History Narrative   Single   Never Smoked    Alcohol use- no   Drug use-no   Regular Exercise-yes   Former Smoker- 12/2008    Past Surgical History  Procedure Laterality Date  . Abdominal hysterectomy  1978  . Total knee arthroplasty Right   . Hnp    . Nephrectomy Right 2010    10.rcc cancer  . Back surgery      x 2  . Colonoscopy w/ biopsies and polypectomy      Hx: of  . Lumbar laminectomy/decompression microdiscectomy Left 02/12/2013    Procedure: LUMBAR TWO THREE, LUMBAR THREE  FOUR, LUMBAR FOUR FIVE  LAMINECTOMY/DECOMPRESSION MICRODISCECTOMY 3 LEVELS;  Surgeon: Charlie Pitter, MD;  Location: [facility];  Service: Neurosurgery;  Laterality: Left;  . Carpal tunnel release Left     Family History  Problem Relation Age of Onset  . Rheum arthritis Mother   . Stroke Mother   . Diabetes Brother     x 2  . Colon cancer Neg Hx   . Esophageal cancer Neg Hx   . Pancreatic cancer Neg Hx   . Liver disease Neg Hx   . Prostate cancer Father   . Heart disease Father   . Kidney disease Son     Allergies  Allergen Reactions  . Percocet [Oxycodone-Acetaminophen] Hives, Itching and Other (See Comments)    hallucinations  . Hydrocodone Other (See Comments)    "crazy dreams"  . Aspirin Other (See Comments)    REACTION: GI upset    Current Outpatient Prescriptions on File Prior to Visit  Medication Sig Dispense Refill  . acetaminophen (TYLENOL) 500 MG tablet Take 500 mg by mouth 2 (two) times daily as needed (pain).    [name] Kitchen amLODipine  (NORVASC) 2.5 MG tablet Take 1 tablet (2.5 mg total) by mouth daily. 90 tablet 3  . atorvastatin (LIPITOR) 20 MG tablet Take 1 tablet (20 mg total) by mouth daily. 100 tablet 3  . cyclobenzaprine (FLEXERIL) 5 MG tablet Take 1 tablet (5 mg total) by mouth 3 (three) times daily as needed for muscle spasms. 30 tablet 1  . famotidine (PEPCID) 20 MG tablet Take 1 tablet (20 mg total) by mouth daily. 30 tablet 0  . fluticasone (FLONASE) 50 MCG/ACT nasal spray Place 2 sprays into the nose as needed for allergies.     [name] Kitchen glucose blood (ONETOUCH VERIO) test strip Use once daily for check glucose levels.  Dx E11.9 100 each 12  . hydroxychloroquine (PLAQUENIL) 200 MG tablet Take 2 tablets (400 mg total) by mouth daily. Resume on 11/10/14. Take 2 by mouth daily    . leflunomide (ARAVA) 20 MG tablet     . levothyroxine (SYNTHROID, LEVOTHROID) 50 MCG tablet Take 1 tablet (50 mcg total) by mouth daily. 100 tablet 3  . losartan-hydrochlorothiazide (HYZAAR) 100-12.5 MG per tablet Take 1 tablet by mouth daily. 100 tablet 3  . metFORMIN (GLUCOPHAGE) 500 MG tablet One half tab every morning 100 tablet 3  . ondansetron (ZOFRAN-ODT) 4 MG disintegrating tablet Take 1 tablet (4 mg total) by mouth every 8 (eight) hours as needed for nausea or vomiting. 20 tablet 0  . pantoprazole (PROTONIX) 40 MG tablet TAKE 1 TABLET BY MOUTH EVERY DAY 100 tablet 2   No current facility-administered medications on file prior to visit.    BP 130/82 mmHg  Temp(Src) 98.3 F (36.8 C) (Oral)  Ht 5\' 6"  (1.676 m)  Wt 193 lb 9.6 oz (87.816 kg)  BMI 31.26 kg/m2       Objective:   Physical Exam  Constitutional: She is oriented to person, place, and time. She appears well-developed and well-nourished. No distress.  Eyes: Right eye exhibits no discharge. Left eye exhibits discharge.  Cardiovascular: Normal rate, regular rhythm, normal heart sounds and intact distal pulses.  Exam reveals no gallop and no friction rub.   No murmur  heard. Pulmonary/Chest: Effort normal and breath sounds normal. No respiratory distress. She has no wheezes. She has no rales. She exhibits no tenderness.  Musculoskeletal: Normal range of motion. She exhibits tenderness (hands). She exhibits no edema.  Neurological:  She is alert and oriented to person, place, and time.  Skin: Skin is warm and dry. No rash noted. She is not diaphoretic. No erythema. No pallor.  Psychiatric: She has a normal mood and affect. Her behavior is normal. Judgment and thought content normal.  Nursing note and vitals reviewed.     Assessment & Plan:  1. Essential hypertension - BP: 130/82 mmHg  - Continue with current regimen  - Continue to monitor BP at home and inform us if abnormally high or low - Follow up as needed

## 2015-01-23 NOTE — Patient Instructions (Addendum)
It was great meeting you today. Continue with your current blood pressure medication and continue to monitor them at home. If you notice that your blood pressure is getting high or low, please let me know.   You can try [facility] for your hot flashes.

## 2015-01-23 NOTE — Progress Notes (Signed)
Pre visit review using our clinic review tool, if applicable. No additional management support is needed unless otherwise documented below in the visit note. 

## 2015-01-29 ENCOUNTER — Encounter: Payer: Self-pay | Admitting: Cardiology

## 2015-01-29 DIAGNOSIS — R7989 Other specified abnormal findings of blood chemistry: Secondary | ICD-10-CM

## 2015-01-29 DIAGNOSIS — R778 Other specified abnormalities of plasma proteins: Secondary | ICD-10-CM | POA: Insufficient documentation

## 2015-01-30 ENCOUNTER — Encounter: Payer: Self-pay | Admitting: Gastroenterology

## 2015-01-31 ENCOUNTER — Ambulatory Visit: Payer: Medicare HMO | Admitting: Cardiology

## 2015-03-04 ENCOUNTER — Other Ambulatory Visit: Payer: Self-pay | Admitting: Family Medicine

## 2015-03-04 LAB — HM DIABETES EYE EXAM

## 2015-03-06 ENCOUNTER — Other Ambulatory Visit: Payer: Self-pay | Admitting: Family Medicine

## 2015-03-12 ENCOUNTER — Telehealth: Payer: Self-pay | Admitting: *Deleted

## 2015-03-12 NOTE — Telephone Encounter (Signed)
Patient called, states she has occasional diarrhea when she eats.  She reports intermittent abdominal pain and diarrhea that self-resolves x 1 week.  She states this is "nowhere near" how she felt when she had c. Diff.  She states she wanted to let Dr. Baxter Flattery know as she was directed at her last appointment 12/2014.  She has not taken antibiotics of any kind since she finished the oral vancomycin taper. She is scheduled for follow up with Dr. Deatra Ina at [place] 9/2 and 9/16. RN thanked her for letting us know her symptoms, advised patient to keep her GI appointment.  She will call us back if her diarrhea changes in the mean time.  Landis Gandy, RN

## 2015-03-13 ENCOUNTER — Encounter: Payer: Self-pay | Admitting: Family Medicine

## 2015-03-13 NOTE — Telephone Encounter (Signed)
If she notices having diarrhea ( 3 loose stools x 2 days) persistent, let us know and we will have her come in sooner. Keep her appt with kaplan.

## 2015-04-04 ENCOUNTER — Ambulatory Visit (AMBULATORY_SURGERY_CENTER): Payer: Self-pay

## 2015-04-04 VITALS — Ht 66.0 in | Wt 195.0 lb

## 2015-04-04 DIAGNOSIS — Z86010 Personal history of colonic polyps: Secondary | ICD-10-CM

## 2015-04-04 NOTE — Progress Notes (Signed)
No egg or soy allergy.  No previous complications from anesthesia. No home O2. No diet meds. 

## 2015-04-18 ENCOUNTER — Ambulatory Visit (AMBULATORY_SURGERY_CENTER): Payer: Medicare HMO | Admitting: Gastroenterology

## 2015-04-18 ENCOUNTER — Encounter: Payer: Self-pay | Admitting: Gastroenterology

## 2015-04-18 VITALS — BP 132/68 | HR 74 | Temp 96.2°F | Resp 20 | Ht 66.0 in | Wt 193.0 lb

## 2015-04-18 DIAGNOSIS — D122 Benign neoplasm of ascending colon: Secondary | ICD-10-CM | POA: Diagnosis not present

## 2015-04-18 DIAGNOSIS — Z860100 Personal history of colon polyps, unspecified: Secondary | ICD-10-CM

## 2015-04-18 LAB — GLUCOSE, CAPILLARY
Glucose-Capillary: 63 mg/dL — ABNORMAL LOW (ref 65–99)
Glucose-Capillary: 93 mg/dL (ref 65–99)

## 2015-04-18 MED ORDER — SODIUM CHLORIDE 0.9 % IV SOLN: 500.0000 mL | INTRAVENOUS | Status: DC

## 2015-04-18 NOTE — Progress Notes (Signed)
CBG this am 61, feeling shaky, denies dizziness, n,v Orders to give D5 NS per protocol MD aware

## 2015-04-18 NOTE — Progress Notes (Signed)
Called to room to assist during endoscopic procedure.  Patient ID and intended procedure confirmed with present staff. Received instructions for my participation in the procedure from the performing physician.  

## 2015-04-18 NOTE — Op Note (Signed)
[facility]  [address] [place], [id number]   COLONOSCOPY PROCEDURE REPORT  PATIENT: Rose, Blackwell  MR#: [id number] BIRTHDATE: 1945/10/26 , 69  yrs. old GENDER: female ENDOSCOPIST: Inda Castle, MD REFERRED DH:RCBULAG Delora Fuel, M.D. , Karolee Ohs, MD PROCEDURE DATE:  04/18/2015 PROCEDURE:   Colonoscopy, diagnostic and Colonoscopy with cold biopsy polypectomy First Screening Colonoscopy - Avg.  risk and is 50 yrs.  old or older - No.  Prior Negative Screening - Now for repeat screening. Less than 10 yrs Prior Negative Screening - Now for repeat screening.  Other: See Comments  History of Adenoma - Now for follow-up colonoscopy & has been > or = to 3 yrs.  N/A  Polyps removed today? Yes ASA CLASS:   Class II INDICATIONS:Colorectal Neoplasm Risk Assessment for this procedure is average risk and history of pseudomembranous colitis several months ago, intermittent but persistent diarrhea. MEDICATIONS: Monitored anesthesia care and Propofol 170 mg IV  DESCRIPTION OF PROCEDURE:   After the risks benefits and alternatives of the procedure were thoroughly explained, informed consent was obtained.  The digital rectal exam revealed no abnormalities of the rectum.   The LB [id number]  endoscope was introduced through the anus and advanced to the cecum, which was identified by both the appendix and ileocecal valve. No adverse events experienced.   The quality of the prep was (Suprep was used) excellent.  The instrument was then slowly withdrawn as the colon was fully examined. Estimated blood loss is zero unless otherwise noted in this procedure report.      COLON FINDINGS: A sessile polyp measuring 2 mm in size was found in the ascending colon.  Polypectomies were performed with cold forceps.   The examination was otherwise normal.  Retroflexed views revealed no abnormalities. The time to cecum = 2.9 Withdrawal time = 7.1   The scope was withdrawn and the  procedure completed. COMPLICATIONS: There were no immediate complications.  ENDOSCOPIC IMPRESSION: 1.   Sessile polyp was found in the ascending colon; polypectomies were performed with cold forceps 2.   The examination was otherwise normal  RECOMMENDATIONS: 1.  If the polyp(s) removed today are proven to be adenomatous (pre-cancerous) polyps, you will need a repeat colonoscopy in 5 years.  Otherwise you should continue to follow colorectal cancer screening guidelines for "routine risk" patients with colonoscopy in 10 years.  You will receive a letter within 1-2 weeks with the results of your biopsy as well as final recommendations.  Please call my office if you have not received a letter after 3 weeks. 2.  Fiber supplementation  eSigned:  Inda Castle, MD 04/18/2015 10:31 AM   cc:   PATIENT NAME:  Rose, Blackwell MR#: [id number]

## 2015-04-18 NOTE — Progress Notes (Signed)
To rec0overy, report to S. Nyra Capes, RN, VSS

## 2015-04-18 NOTE — Patient Instructions (Signed)
YOU HAD AN ENDOSCOPIC PROCEDURE TODAY AT THE [facility]:   Refer to the procedure report that was given to you for any specific questions about what was found during the examination.  If the procedure report does not answer your questions, please call your gastroenterologist to clarify.  If you requested that your care partner not be given the details of your procedure findings, then the procedure report has been included in a sealed envelope for you to review at your convenience later.  YOU SHOULD EXPECT: Some feelings of bloating in the abdomen. Passage of more gas than usual.  Walking can help get rid of the air that was put into your GI tract during the procedure and reduce the bloating. If you had a lower endoscopy (such as a colonoscopy or flexible sigmoidoscopy) you may notice spotting of blood in your stool or on the toilet paper. If you underwent a bowel prep for your procedure, you may not have a normal bowel movement for a few days.  Please Note:  You might notice some irritation and congestion in your nose or some drainage.  This is from the oxygen used during your procedure.  There is no need for concern and it should clear up in a day or so.  SYMPTOMS TO REPORT IMMEDIATELY:   Following lower endoscopy (colonoscopy or flexible sigmoidoscopy):  Excessive amounts of blood in the stool  Significant tenderness or worsening of abdominal pains  Swelling of the abdomen that is new, acute  Fever of 100F or higher   For urgent or emergent issues, a gastroenterologist can be reached at any hour by calling [phone].   DIET: Your first meal following the procedure should be a small meal and then it is ok to progress to your normal diet. Heavy or fried foods are harder to digest and may make you feel nauseous or bloated.  Likewise, meals heavy in dairy and vegetables can increase bloating.  Drink plenty of fluids but you should avoid alcoholic beverages for 24 hours. Try to  increase the fiber in your diet.  ACTIVITY:  You should plan to take it easy for the rest of today and you should NOT DRIVE or use heavy machinery until tomorrow (because of the sedation medicines used during the test).    FOLLOW UP: Our staff will call the number listed on your records the next business day following your procedure to check on you and address any questions or concerns that you may have regarding the information given to you following your procedure. If we do not reach you, we will leave a message.  However, if you are feeling well and you are not experiencing any problems, there is no need to return our call.  We will assume that you have returned to your regular daily activities without incident.  If any biopsies were taken you will be contacted by phone or by letter within the next 1-3 weeks.  Please call us at [phone] if you have not heard about the biopsies in 3 weeks.    SIGNATURES/CONFIDENTIALITY: You and/or your care partner have signed paperwork which will be entered into your electronic medical record.  These signatures attest to the fact that that the information above on your After Visit Summary has been reviewed and is understood.  Full responsibility of the confidentiality of this discharge information lies with you and/or your care-partner.  Please, read all of the handouts given to you by your recovery room nurse. 

## 2015-04-21 ENCOUNTER — Telehealth: Payer: Self-pay | Admitting: *Deleted

## 2015-04-21 NOTE — Telephone Encounter (Signed)
  Follow up Call-  Call back number 04/18/2015  Post procedure Call Back phone  # [phone]  Permission to leave phone message Yes     Patient questions:  Do you have a fever, pain , or abdominal swelling? No. Pain Score  0 *  Have you tolerated food without any problems? Yes.    Have you been able to return to your normal activities? Yes.    Do you have any questions about your discharge instructions: Diet   No. Medications  No. Follow up visit  No.  Do you have questions or concerns about your Care? No.  Actions: * If pain score is 4 or above: No action needed, pain <4.

## 2015-04-22 ENCOUNTER — Encounter: Payer: Self-pay | Admitting: Gastroenterology

## 2015-05-12 ENCOUNTER — Telehealth: Payer: Self-pay | Admitting: Family Medicine

## 2015-05-12 NOTE — Telephone Encounter (Signed)
Pt states her blood sugars are running low consisently. Pt states it is running 80's and 90's.  Pt states she has quit taking metformin.  3 weeks ago when she had a colonoscopy and had to be given sugar water because her blood sugars was 68. Pt would like to see Dr Sherren Mocha. pls advise if OK to work in.

## 2015-05-12 NOTE — Telephone Encounter (Signed)
Ok to notify Dr. Sherren Mocha tomorrow. Or, if she wishes urgent appt today ok to put in available slot - looks like she has see Tommi Rumps in the past.

## 2015-05-12 NOTE — Telephone Encounter (Signed)
I called the pt and offered her an appt for today and she stated she feels better after she eats and prefers to come in tomorrow.  I scheduled the pt an appt to see [facility] tomorrow at [place].

## 2015-05-13 ENCOUNTER — Ambulatory Visit (INDEPENDENT_AMBULATORY_CARE_PROVIDER_SITE_OTHER): Payer: Medicare HMO | Admitting: Adult Health

## 2015-05-13 VITALS — BP 128/80 | Temp 98.4°F | Ht 66.0 in | Wt 196.0 lb

## 2015-05-13 DIAGNOSIS — E139 Other specified diabetes mellitus without complications: Secondary | ICD-10-CM

## 2015-05-13 DIAGNOSIS — Z23 Encounter for immunization: Secondary | ICD-10-CM | POA: Diagnosis not present

## 2015-05-13 LAB — BASIC METABOLIC PANEL
BUN: 16 mg/dL (ref 6–23)
CO2: 28 mEq/L (ref 19–32)
Calcium: 10 mg/dL (ref 8.4–10.5)
Chloride: 107 mEq/L (ref 96–112)
Creatinine, Ser: 0.96 mg/dL (ref 0.40–1.20)
GFR: 73.62 mL/min (ref >60.00)
Glucose, Bld: 90 mg/dL (ref 70–99)
Potassium: 4.4 mEq/L (ref 3.5–5.1)
Sodium: 143 mEq/L (ref 135–145)

## 2015-05-13 LAB — HEMOGLOBIN A1C: Hgb A1c MFr Bld: 5.4 % (ref 4.6–6.5)

## 2015-05-13 NOTE — Patient Instructions (Signed)
It was great seeing you again!  I will follow up with you regarding your labs  Stop the Metformin but continue to monitor blood sugars. If they are becoming greater than 120 then restart the Metformin. Continue to follow a diabetic diet and exercise.

## 2015-05-13 NOTE — Telephone Encounter (Signed)
Pt seen in office today.

## 2015-05-13 NOTE — Progress Notes (Signed)
Subjective:    Patient ID: [name], female    DOB: January 02, 1946, 69 y.o.   MRN: [id number]  HPI  69 year old female, patient of Dr. Sherren Mocha. She is in today for low blood sugars. Currently she takes Metformin 250mg  once a day. About 4 weeks ago she noticed that her blood sugars were running low. She had gone into have her colonoscopy done at this time and her blood sugar was 60. Since that time her blood sugars have been in the 80-100 range. She stopped taking her Metformin for three weeks and her blood sugars continue to be WNL. She has occasional episodes of dizziness, but no other symptoms. She is checking her blood sugar at home daily.   Her last A1c was 5.0 and that was on 12/16/2014. She is following a diabetic diet, does not exercise on a regular basis.    Review of Systems  Constitutional: Negative.   HENT: Negative.   Respiratory: Negative.   Cardiovascular: Negative.   Musculoskeletal: Negative.   Neurological: Negative.   Psychiatric/Behavioral: Negative.   All other systems reviewed and are negative.  Past Medical History  Diagnosis Date  . Allergy   . GERD (gastroesophageal reflux disease)   . Hypertension   . Thyroid disease   . Costochondritis   . S/P TKR (total knee replacement) 09  . S/P TAH (total abdominal hysterectomy)   . Total knee replacement status 09/2007    redo  . Diabetes mellitus without complication     type 2  . Shortness of breath     Hx: of with exertion  . Pneumonia     Hx: of  . Tingling     Hx: of in feet  . Arthritis     RA (Dr. Ouida Sills)  . Osteoarthritis   . Gastritis   . Colon polyps 2008    HYPERPLASTIC  . Chronic kidney disease     rt kidney removed  . Hx of cardiovascular stress test     Lexiscan Myoview 6/16:  EF 70%, no scar or ischemia; Low Risk    Social History   Social History  . Marital Status: Divorced    Spouse Name: N/A  . Number of Children: 3  . Years of Education: N/A   Occupational History  . Retired     Social History Main Topics  . Smoking status: Current Every Day Smoker -- 0.25 packs/day for 15 years    Types: Cigarettes  . Smokeless tobacco: Never Used     Comment: smokes 1 pack per 3 days  . Alcohol Use: No  . Drug Use: No  . Sexual Activity: Not on file   Other Topics Concern  . Not on file   Social History Narrative   Single   Never Smoked    Alcohol use- no   Drug use-no   Regular Exercise-yes   Former Smoker- 12/2008    Past Surgical History  Procedure Laterality Date  . Abdominal hysterectomy  1978  . Total knee arthroplasty Right   . Hnp    . Nephrectomy Right 2010    10.rcc cancer  . Back surgery      x 2  . Colonoscopy w/ biopsies and polypectomy      Hx: of  . Lumbar laminectomy/decompression microdiscectomy Left 02/12/2013    Procedure: LUMBAR TWO THREE, LUMBAR THREE FOUR, LUMBAR FOUR FIVE  LAMINECTOMY/DECOMPRESSION MICRODISCECTOMY 3 LEVELS;  Surgeon: Charlie Pitter, MD;  Location: [facility];  Service:  Neurosurgery;  Laterality: Left;  . Carpal tunnel release Left     Family History  Problem Relation Age of Onset  . Rheum arthritis Mother   . Stroke Mother   . Diabetes Brother     x 2  . Colon cancer Neg Hx   . Esophageal cancer Neg Hx   . Pancreatic cancer Neg Hx   . Liver disease Neg Hx   . Prostate cancer Father   . Heart disease Father   . Kidney disease Son     Allergies  Allergen Reactions  . Percocet [Oxycodone-Acetaminophen] Hives, Itching and Other (See Comments)    hallucinations  . Hydrocodone Other (See Comments)    "crazy dreams"  . Aspirin Other (See Comments)    REACTION: GI upset    Current Outpatient Prescriptions on File Prior to Visit  Medication Sig Dispense Refill  . acetaminophen (TYLENOL) 500 MG tablet Take 500 mg by mouth 2 (two) times daily as needed (pain).    [name] Kitchen amLODipine (NORVASC) 2.5 MG tablet Take 1 tablet (2.5 mg total) by mouth daily. 90 tablet 3  . atorvastatin (LIPITOR) 20 MG tablet Take 1 tablet  (20 mg total) by mouth daily. 100 tablet 3  . BLACK COHOSH PO Take by mouth.    . cyclobenzaprine (FLEXERIL) 5 MG tablet Take 1 tablet (5 mg total) by mouth 3 (three) times daily as needed for muscle spasms. 30 tablet 1  . famotidine (PEPCID) 20 MG tablet Take 1 tablet (20 mg total) by mouth daily. 30 tablet 0  . fluticasone (FLONASE) 50 MCG/ACT nasal spray Place 2 sprays into the nose as needed for allergies.     [name] Kitchen glucose blood (ONETOUCH VERIO) test strip Use once daily for check glucose levels.  Dx E11.9 100 each 12  . hydroxychloroquine (PLAQUENIL) 200 MG tablet Take 2 tablets (400 mg total) by mouth daily. Resume on 11/10/14. Take 2 by mouth daily    . leflunomide (ARAVA) 20 MG tablet     . levothyroxine (SYNTHROID, LEVOTHROID) 50 MCG tablet Take 1 tablet (50 mcg total) by mouth daily. 100 tablet 3  . losartan-hydrochlorothiazide (HYZAAR) 100-12.5 MG per tablet TAKE 1 TABLET BY MOUTH DAILY. 100 tablet 0  . ondansetron (ZOFRAN-ODT) 4 MG disintegrating tablet Take 1 tablet (4 mg total) by mouth every 8 (eight) hours as needed for nausea or vomiting. 20 tablet 0  . pantoprazole (PROTONIX) 40 MG tablet TAKE 1 TABLET BY MOUTH EVERY DAY 100 tablet 2  . metFORMIN (GLUCOPHAGE) 500 MG tablet TAKE 1 TABLET BY MOUTH DAILY WITH BREAKFAST. (Patient not taking: Reported on 05/13/2015) 100 tablet 2   No current facility-administered medications on file prior to visit.    BP 128/80 mmHg  Temp(Src) 98.4 F (36.9 C) (Oral)  Ht 5\' 6"  (1.676 m)  Wt 196 lb (88.905 kg)  BMI 31.65 kg/m2       Objective:   Physical Exam  Constitutional: She is oriented to person, place, and time. She appears well-developed and well-nourished. No distress.  Cardiovascular: Normal rate, regular rhythm, normal heart sounds and intact distal pulses.  Exam reveals no gallop and no friction rub.   No murmur heard. Pulmonary/Chest: Effort normal and breath sounds normal. No respiratory distress. She has no wheezes. She has no  rales. She exhibits no tenderness.  Musculoskeletal: Normal range of motion. She exhibits no edema or tenderness.  Neurological: She is alert and oriented to person, place, and time.  Skin: Skin is warm and dry. No  rash noted. She is not diaphoretic. No erythema. No pallor.  Psychiatric: She has a normal mood and affect. Her behavior is normal. Judgment and thought content normal.  Nursing note and vitals reviewed.      Assessment & Plan:  1. Diabetes 1.5, managed as type 2 ([facility]) - Hemoglobin A1K - Basic metabolic panel - Ok to d/c metformin.  - Continue to monitor blood sugars at home. If her blood sugars are above 120 on a continuous basis then restart Metformin 250mg  daily and follow up with Dr. Sherren Mocha.  - Continue to eat healthy and start exercising

## 2015-05-13 NOTE — Progress Notes (Signed)
Pre visit review using our clinic review tool, if applicable. No additional management support is needed unless otherwise documented below in the visit note. 

## 2015-05-14 DIAGNOSIS — M0609 Rheumatoid arthritis without rheumatoid factor, multiple sites: Secondary | ICD-10-CM | POA: Diagnosis not present

## 2015-05-14 DIAGNOSIS — Z79899 Other long term (current) drug therapy: Secondary | ICD-10-CM | POA: Diagnosis not present

## 2015-05-14 DIAGNOSIS — M19041 Primary osteoarthritis, right hand: Secondary | ICD-10-CM | POA: Diagnosis not present

## 2015-05-14 DIAGNOSIS — M19032 Primary osteoarthritis, left wrist: Secondary | ICD-10-CM | POA: Diagnosis not present

## 2015-05-16 DIAGNOSIS — Z1231 Encounter for screening mammogram for malignant neoplasm of breast: Secondary | ICD-10-CM | POA: Diagnosis not present

## 2015-06-23 ENCOUNTER — Other Ambulatory Visit: Payer: Self-pay | Admitting: Family Medicine

## 2015-06-23 DIAGNOSIS — R69 Illness, unspecified: Secondary | ICD-10-CM | POA: Diagnosis not present

## 2015-07-08 ENCOUNTER — Encounter: Payer: Self-pay | Admitting: Family Medicine

## 2015-07-08 ENCOUNTER — Ambulatory Visit (INDEPENDENT_AMBULATORY_CARE_PROVIDER_SITE_OTHER): Payer: Medicare HMO | Admitting: Family Medicine

## 2015-07-08 VITALS — BP 140/90 | HR 80 | Temp 100.2°F | Wt 200.3 lb

## 2015-07-08 DIAGNOSIS — J4521 Mild intermittent asthma with (acute) exacerbation: Secondary | ICD-10-CM | POA: Diagnosis not present

## 2015-07-08 MED ORDER — PREDNISONE 20 MG PO TABS: ORAL_TABLET | ORAL | Status: DC

## 2015-07-08 MED ORDER — HYDROCODONE-HOMATROPINE 5-1.5 MG/5ML PO SYRP: 5.0000 mL | ORAL_SOLUTION | Freq: Three times a day (TID) | ORAL | Status: DC | PRN

## 2015-07-08 NOTE — Patient Instructions (Signed)
Rest at home  Drink lots of fluids  Tylenol.......... 2 tabs 3 times daily  Prednisone 20 mg........ 2 tabs daily 3 days then taper as outlined  Hydromet...[name] KitchenMarland KitchenMarland Kitchen 1/2-1 teaspoon 3 times daily when necessary for cough  Return when necessary

## 2015-07-08 NOTE — Progress Notes (Signed)
   Subjective:    Patient ID: [name], female    DOB: 1945/08/20, 69 y.o.   MRN: [id number]  HPI [name] is a 69 year old female single nonsmoker who comes in today with a viral infection for 1 day. She has had congestion runny nose and doesn't feel good. She has history of allergic rhinitis and asthma   Review of Systems Review of systems otherwise negative    Objective:   Physical Exam  Well-developed well-nourished female no acute distress vital signs stable she's afebrile  HEENT were negative neck was supple no adenopathy lungs are clear except for some mild wheezing on forced expiration      Assessment & Plan:  Viral syndrome with secondary wheezing........... treat symptomatically add short course of prednisone for the wheezing

## 2015-07-08 NOTE — Progress Notes (Signed)
Pre visit review using our clinic review tool, if applicable. No additional management support is needed unless otherwise documented below in the visit note. 

## 2015-08-01 ENCOUNTER — Telehealth: Payer: Self-pay | Admitting: Family Medicine

## 2015-08-01 NOTE — Telephone Encounter (Signed)
[name] called saying she still has upper respiratory symptoms. She's congestion, has a cough, and headache. Her blood pressure this morning was 110/70 and her blood sugar level was 95. I transferred her to the triage nurse since Dr. Sherren Mocha isn't here today. Please call the pt if needed.  Pt's ph# [phone] Thank you.

## 2015-08-01 NOTE — Telephone Encounter (Signed)
Noted  

## 2015-08-01 NOTE — Telephone Encounter (Signed)
[name] - Client [facility] Patient Name: [name] Gender: Female DOB: 04-09-1946 Age: 69 Y 76 M 63 D Return Phone Number: [phone] (Primary) Address: [place] City/State/Zip: [place] [address] Client [facility] - Client Client Site [facility] - Day Physician [place], [place] Type Call Call Type Triage / Clinical Relationship To Patient Self Appointment Disposition EMR Appointment Attempted - Not Scheduled Info pasted into Epic Yes Return Phone Number [phone] (Primary) Chief Complaint Headache Initial Comment Caller States has cough, headache, BP 110/70, BS 95 PreDisposition Call Doctor Nurse Assessment Nurse: Mechele Dawley, RN, Amy Date/Time Eilene Ghazi Time): 08/01/2015 11:52:17 AM Confirm and document reason for call. If symptomatic, describe symptoms. ---COUGH AND HEADACHE STARTED OVERNIGHT WITH THE HEADACHE. SHE HAD URI. SHE STATES THAT SHE CAN NOT GET RID OF THE COUGH. THERE IS A LOT OF MUCOUS COMING UP. SHE HAS HOT FLASHES AND SHE IS WARM NOW. FINISHING UP PREDNISONE. Has the patient traveled out of the country within the last 30 days? ---Not Applicable Does the patient have any new or worsening symptoms? ---Yes Will a triage be completed? ---Yes Related visit to physician within the last 2 weeks? ---Yes Does the PT have any chronic conditions? (i.e. diabetes, asthma, etc.) ---Yes List chronic conditions. ---DIABETIC Is this a behavioral health or substance abuse call? ---No Guidelines Guideline Title Affirmed Question Affirmed Notes Nurse Date/Time (Eastern Time) Cough - Acute Productive SEVERE coughing spells (e.g., whooping sound after coughing, vomiting after coughing) Mechele Dawley, RN, Amy 08/01/2015 11:53:16 AM Disp. Time Eilene Ghazi Time) Disposition Final User 08/01/2015 12:00:04 PM See Physician within 24 Hours Yes Franklin, RN,  Amy PLEASE NOTE: All timestamps contained within this report are represented as [place] Standard Time. CONFIDENTIALTY NOTICE: This fax transmission is intended only for the addressee. It contains information that is legally privileged, confidential or otherwise protected from use or disclosure. If you are not the intended recipient, you are strictly prohibited from reviewing, disclosing, copying using or disseminating any of this information or taking any action in reliance on or regarding this information. If you have received this fax in error, please notify us immediately by telephone so that we can arrange for its return to [place]. Phone: [phone], Toll-Free: [phone], Fax: [phone] Page: 2 of 2 Call Id: [id number] Caller Understands: Yes Disagree/Comply: Comply Care Advice Given Per Guideline CALL BACK IF: * Difficulty breathing occurs * You become worse. CARE ADVICE given per Cough - Acute Productive (Adult) guideline. After Care Instructions Given Call Event Type User Date / Time Description Referrals [facility]

## 2015-08-01 NOTE — Telephone Encounter (Signed)
Patient going to the Saturday clinic

## 2015-08-02 ENCOUNTER — Encounter: Payer: Self-pay | Admitting: Family Medicine

## 2015-08-02 ENCOUNTER — Ambulatory Visit (INDEPENDENT_AMBULATORY_CARE_PROVIDER_SITE_OTHER): Payer: Medicare HMO | Admitting: Family Medicine

## 2015-08-02 VITALS — BP 112/80 | HR 81 | Temp 97.9°F | Wt 203.8 lb

## 2015-08-02 DIAGNOSIS — J209 Acute bronchitis, unspecified: Secondary | ICD-10-CM | POA: Diagnosis not present

## 2015-08-02 MED ORDER — AZITHROMYCIN 250 MG PO TABS: ORAL_TABLET | ORAL | Status: DC

## 2015-08-02 NOTE — Patient Instructions (Signed)
Possible persistent mild bronchitis - treat with zpack. Push fluids and rest.  Let us know if not improving with treatment, come back for recheck if that is the case.

## 2015-08-02 NOTE — Progress Notes (Signed)
Pre visit review using our clinic review tool, if applicable. No additional management support is needed unless otherwise documented below in the visit note. 

## 2015-08-02 NOTE — Assessment & Plan Note (Signed)
Exam overall stable. sxs suggestive bronchitis. Cover with zpack given duration of illness. Update for recheck if not improved with treatment. Pt agrees with plan. No sxs of reactive airways today, not consistent with CHF.

## 2015-08-02 NOTE — Progress Notes (Signed)
BP 112/80 mmHg  Pulse 81  Temp(Src) 97.9 F (36.6 C) (Oral)  Wt 203 lb 12 oz (92.42 kg)  SpO2 98%   CC: persistent illness  Subjective:    Patient ID: [name], female    DOB: 07-Aug-1945, 69 y.o.   MRN: [id number]  HPI: [name] is a 69 y.o. female presenting on 08/02/2015 for URI   3-4 wk h/o persistent congestion. Seen by PCP 12/6 with dx viral syndrome with wheezing - treated with supportive care (tylenol, hydromet) and prednisone which helped. Not improving - persistent productive cough with chest congestion. + headache. + wheezing, mild dyspnea.  No chest pain, fevers/chills, ear or tooth pain, ST, PNdrainage.   Tried corcedin which helps some.  Daughter sick initially at home.  Pt smokes - working on quitting.  Denies asthma or COPD.  H/o PNA.  She is taking black cohosh for hot flashes. Has f/u with GYN for further evaluation of hormones.  Relevant past medical, surgical, family and social history reviewed and updated as indicated. Interim medical history since our last visit reviewed. Allergies and medications reviewed and updated. Current Outpatient Prescriptions on File Prior to Visit  Medication Sig  . acetaminophen (TYLENOL) 500 MG tablet Take 500 mg by mouth 2 (two) times daily as needed (pain).  [name] Kitchen amLODipine (NORVASC) 2.5 MG tablet Take 1 tablet (2.5 mg total) by mouth daily.  [name] Kitchen atorvastatin (LIPITOR) 20 MG tablet Take 1 tablet (20 mg total) by mouth daily.  [name] Kitchen BLACK COHOSH PO Take by mouth.  . cyclobenzaprine (FLEXERIL) 5 MG tablet Take 1 tablet (5 mg total) by mouth 3 (three) times daily as needed for muscle spasms.  . famotidine (PEPCID) 20 MG tablet Take 1 tablet (20 mg total) by mouth daily.  . fluticasone (FLONASE) 50 MCG/ACT nasal spray Place 2 sprays into the nose as needed for allergies.   . hydroxychloroquine (PLAQUENIL) 200 MG tablet Take 2 tablets (400 mg total) by mouth daily. Resume on 11/10/14. Take 2 by mouth daily  . leflunomide (ARAVA) 20  MG tablet   . levothyroxine (SYNTHROID, LEVOTHROID) 50 MCG tablet Take 1 tablet (50 mcg total) by mouth daily.  [name] Kitchen losartan-hydrochlorothiazide (HYZAAR) 100-12.5 MG per tablet TAKE 1 TABLET BY MOUTH DAILY.  . metFORMIN (GLUCOPHAGE) 500 MG tablet TAKE 1 TABLET BY MOUTH DAILY WITH BREAKFAST.  [name] Kitchen ondansetron (ZOFRAN-ODT) 4 MG disintegrating tablet Take 1 tablet (4 mg total) by mouth every 8 (eight) hours as needed for nausea or vomiting.  [name] VERIO test strip USE ONCE DAILY FOR CHECK GLUCOSE LEVELS. DX E11.9  . pantoprazole (PROTONIX) 40 MG tablet TAKE 1 TABLET BY MOUTH EVERY DAY  . predniSONE (DELTASONE) 20 MG tablet 2 tabs x 3 days, 1 tab x 3 days, 1/2 tab x 3 days, 1/2 tab M,W,F x 2 weeks   No current facility-administered medications on file prior to visit.    Review of Systems Per HPI unless specifically indicated in ROS section     Objective:    BP 112/80 mmHg  Pulse 81  Temp(Src) 97.9 F (36.6 C) (Oral)  Wt 203 lb 12 oz (92.42 kg)  SpO2 98%  Wt Readings from Last 3 Encounters:  08/02/15 203 lb 12 oz (92.42 kg)  07/08/15 200 lb 4.8 oz (90.855 kg)  05/13/15 196 lb (88.905 kg)    Physical Exam  Constitutional: She appears well-developed and well-nourished. No distress.  HENT:  Head: Normocephalic and atraumatic.  Right Ear: Hearing, tympanic membrane, external  ear and ear canal normal.  Left Ear: Hearing, tympanic membrane, external ear and ear canal normal.  Nose: No mucosal edema or rhinorrhea. Right sinus exhibits no maxillary sinus tenderness and no frontal sinus tenderness. Left sinus exhibits no maxillary sinus tenderness and no frontal sinus tenderness.  Mouth/Throat: Uvula is midline, oropharynx is clear and moist and mucous membranes are normal. No oropharyngeal exudate, posterior oropharyngeal edema, posterior oropharyngeal erythema or tonsillar abscesses.  Eyes: Conjunctivae and EOM are normal. Pupils are equal, round, and reactive to light. No scleral icterus.    Neck: Normal range of motion. Neck supple.  Cardiovascular: Normal rate, regular rhythm, normal heart sounds and intact distal pulses.   No murmur heard. Pulmonary/Chest: Effort normal and breath sounds normal. No respiratory distress. She has no wheezes. She has no rhonchi. She has no rales.  Bibasilar crackles that clear with cough  Musculoskeletal: She exhibits no edema.  Lymphadenopathy:    She has no cervical adenopathy.  Skin: Skin is warm and dry. No rash noted.  Nursing note and vitals reviewed.  Lab Results  Component Value Date   CREATININE 0.96 05/13/2015       Assessment & Plan:   Problem List Items Addressed This Visit    Acute bronchitis - Primary    Exam overall stable. sxs suggestive bronchitis. Cover with zpack given duration of illness. Update for recheck if not improved with treatment. Pt agrees with plan. No sxs of reactive airways today, not consistent with CHF.           Follow up plan: Return if symptoms worsen or fail to improve.

## 2015-08-04 NOTE — Addendum Note (Signed)
Addended by: Ria Bush on: 08/04/2015 09:55 AM   Modules accepted: Miquel Dunn

## 2015-09-02 DIAGNOSIS — N951 Menopausal and female climacteric states: Secondary | ICD-10-CM | POA: Diagnosis not present

## 2015-09-15 DIAGNOSIS — M0609 Rheumatoid arthritis without rheumatoid factor, multiple sites: Secondary | ICD-10-CM | POA: Diagnosis not present

## 2015-09-15 DIAGNOSIS — Z79899 Other long term (current) drug therapy: Secondary | ICD-10-CM | POA: Diagnosis not present

## 2015-09-15 DIAGNOSIS — R5383 Other fatigue: Secondary | ICD-10-CM | POA: Diagnosis not present

## 2015-09-29 DIAGNOSIS — M1990 Unspecified osteoarthritis, unspecified site: Secondary | ICD-10-CM | POA: Diagnosis not present

## 2015-09-29 DIAGNOSIS — Z79899 Other long term (current) drug therapy: Secondary | ICD-10-CM | POA: Diagnosis not present

## 2015-09-29 DIAGNOSIS — M25532 Pain in left wrist: Secondary | ICD-10-CM | POA: Diagnosis not present

## 2015-09-29 DIAGNOSIS — M0609 Rheumatoid arthritis without rheumatoid factor, multiple sites: Secondary | ICD-10-CM | POA: Diagnosis not present

## 2015-09-30 DIAGNOSIS — N951 Menopausal and female climacteric states: Secondary | ICD-10-CM | POA: Diagnosis not present

## 2015-10-28 DIAGNOSIS — Z79899 Other long term (current) drug therapy: Secondary | ICD-10-CM | POA: Diagnosis not present

## 2015-10-28 DIAGNOSIS — M25532 Pain in left wrist: Secondary | ICD-10-CM | POA: Diagnosis not present

## 2015-10-28 DIAGNOSIS — M1990 Unspecified osteoarthritis, unspecified site: Secondary | ICD-10-CM | POA: Diagnosis not present

## 2015-10-28 DIAGNOSIS — M0609 Rheumatoid arthritis without rheumatoid factor, multiple sites: Secondary | ICD-10-CM | POA: Diagnosis not present

## 2015-10-29 DIAGNOSIS — R69 Illness, unspecified: Secondary | ICD-10-CM | POA: Diagnosis not present

## 2015-11-10 ENCOUNTER — Telehealth: Payer: Self-pay | Admitting: Family Medicine

## 2015-11-10 DIAGNOSIS — Z8619 Personal history of other infectious and parasitic diseases: Secondary | ICD-10-CM

## 2015-11-10 NOTE — Telephone Encounter (Signed)
Referral placed for gi

## 2015-11-10 NOTE — Telephone Encounter (Signed)
Pt would like to know if she need to have a referral for [facility] for Infectious Disease because of her episode last year of C-Diff.  She is having problems with her stomach Or what should she do about this.

## 2015-11-25 ENCOUNTER — Other Ambulatory Visit: Payer: Self-pay | Admitting: Family Medicine

## 2015-12-04 DIAGNOSIS — Z79899 Other long term (current) drug therapy: Secondary | ICD-10-CM | POA: Diagnosis not present

## 2015-12-04 DIAGNOSIS — M0609 Rheumatoid arthritis without rheumatoid factor, multiple sites: Secondary | ICD-10-CM | POA: Diagnosis not present

## 2015-12-04 DIAGNOSIS — M1990 Unspecified osteoarthritis, unspecified site: Secondary | ICD-10-CM | POA: Diagnosis not present

## 2015-12-04 DIAGNOSIS — M25532 Pain in left wrist: Secondary | ICD-10-CM | POA: Diagnosis not present

## 2015-12-12 ENCOUNTER — Ambulatory Visit: Payer: Medicare HMO | Admitting: Gastroenterology

## 2015-12-17 ENCOUNTER — Other Ambulatory Visit: Payer: Medicare HMO

## 2015-12-24 ENCOUNTER — Encounter: Payer: Medicare HMO | Admitting: Family Medicine

## 2016-01-30 DIAGNOSIS — H43812 Vitreous degeneration, left eye: Secondary | ICD-10-CM | POA: Diagnosis not present

## 2016-02-02 ENCOUNTER — Other Ambulatory Visit: Payer: Self-pay | Admitting: Family Medicine

## 2016-02-05 ENCOUNTER — Ambulatory Visit (INDEPENDENT_AMBULATORY_CARE_PROVIDER_SITE_OTHER): Payer: Medicare HMO | Admitting: Family Medicine

## 2016-02-05 ENCOUNTER — Encounter: Payer: Self-pay | Admitting: Family Medicine

## 2016-02-05 VITALS — BP 124/84 | HR 83 | Temp 98.2°F | Ht 66.0 in | Wt 212.0 lb

## 2016-02-05 DIAGNOSIS — R0781 Pleurodynia: Secondary | ICD-10-CM | POA: Diagnosis not present

## 2016-02-05 MED ORDER — TRAMADOL HCL 50 MG PO TABS: 100.0000 mg | ORAL_TABLET | Freq: Four times a day (QID) | ORAL | Status: DC | PRN

## 2016-02-05 MED ORDER — AMLODIPINE BESYLATE 2.5 MG PO TABS: 2.5000 mg | ORAL_TABLET | Freq: Every day | ORAL | Status: DC

## 2016-02-05 NOTE — Progress Notes (Signed)
   Subjective:    Patient ID: [name], female    DOB: Feb 24, 1946, 70 y.o.   MRN: [id number]  HPI Here for 3 days of a sharp pain in the right ribs. This hurts when she moves. No coughing or SOB. No recent trauma.    Review of Systems  Constitutional: Negative.   Respiratory: Negative.   Cardiovascular: Positive for chest pain. Negative for palpitations and leg swelling.       Objective:   Physical Exam  Constitutional: She is oriented to person, place, and time. She appears well-developed and well-nourished.  Neck: No thyromegaly present.  Cardiovascular: Normal rate, regular rhythm, normal heart sounds and intact distal pulses.   Pulmonary/Chest: Effort normal and breath sounds normal. No respiratory distress. She has no wheezes. She has no rales.  Tender over the right lateral ribs. No crepitus or swelling  Lymphadenopathy:    She has no cervical adenopathy.  Neurological: She is alert and oriented to person, place, and time.  Skin: No rash noted.          Assessment & Plan:  Rib pain, apparently from a muscle strain. Rest , use ice. Add Tramadol prn.  Laurey Morale, MD

## 2016-02-05 NOTE — Progress Notes (Signed)
Pre visit review using our clinic review tool, if applicable. No additional management support is needed unless otherwise documented below in the visit note. 

## 2016-02-20 DIAGNOSIS — R69 Illness, unspecified: Secondary | ICD-10-CM | POA: Diagnosis not present

## 2016-02-27 DIAGNOSIS — H43813 Vitreous degeneration, bilateral: Secondary | ICD-10-CM | POA: Diagnosis not present

## 2016-03-02 DIAGNOSIS — M0609 Rheumatoid arthritis without rheumatoid factor, multiple sites: Secondary | ICD-10-CM | POA: Diagnosis not present

## 2016-03-02 DIAGNOSIS — Z79899 Other long term (current) drug therapy: Secondary | ICD-10-CM | POA: Diagnosis not present

## 2016-03-02 DIAGNOSIS — M1990 Unspecified osteoarthritis, unspecified site: Secondary | ICD-10-CM | POA: Diagnosis not present

## 2016-03-02 DIAGNOSIS — M25532 Pain in left wrist: Secondary | ICD-10-CM | POA: Diagnosis not present

## 2016-03-05 ENCOUNTER — Other Ambulatory Visit: Payer: Self-pay | Admitting: Family Medicine

## 2016-03-12 ENCOUNTER — Ambulatory Visit (INDEPENDENT_AMBULATORY_CARE_PROVIDER_SITE_OTHER): Payer: Medicare HMO | Admitting: Family Medicine

## 2016-03-12 ENCOUNTER — Encounter: Payer: Self-pay | Admitting: Family Medicine

## 2016-03-12 VITALS — BP 147/80 | HR 92 | Temp 98.1°F | Resp 12 | Ht 66.0 in | Wt 216.2 lb

## 2016-03-12 DIAGNOSIS — J4520 Mild intermittent asthma, uncomplicated: Secondary | ICD-10-CM | POA: Diagnosis not present

## 2016-03-12 DIAGNOSIS — I10 Essential (primary) hypertension: Secondary | ICD-10-CM

## 2016-03-12 DIAGNOSIS — J069 Acute upper respiratory infection, unspecified: Secondary | ICD-10-CM | POA: Diagnosis not present

## 2016-03-12 MED ORDER — ALBUTEROL SULFATE HFA 108 (90 BASE) MCG/ACT IN AERS
2.0000 | INHALATION_SPRAY | Freq: Four times a day (QID) | RESPIRATORY_TRACT | 1 refills | Status: DC | PRN
Start: 2016-03-12 — End: 2016-03-12

## 2016-03-12 MED ORDER — FLUTICASONE PROPIONATE 50 MCG/ACT NA SUSP: 1.0000 | Freq: Two times a day (BID) | NASAL | 1 refills | Status: DC | PRN

## 2016-03-12 MED ORDER — ALBUTEROL SULFATE HFA 108 (90 BASE) MCG/ACT IN AERS: 2.0000 | INHALATION_SPRAY | Freq: Four times a day (QID) | RESPIRATORY_TRACT | 1 refills | Status: DC | PRN

## 2016-03-12 MED ORDER — IPRATROPIUM-ALBUTEROL 0.5-2.5 (3) MG/3ML IN SOLN
3.0000 mL | Freq: Once | RESPIRATORY_TRACT | Status: AC
  Administered 2016-03-12: 3 mL via RESPIRATORY_TRACT

## 2016-03-12 NOTE — Progress Notes (Signed)
HPI:  ACUTE VISIT:  Chief Complaint  Patient presents with  . Sinus Problem    Started Tuesday, no energy, cough, sore throat. some nasal discharge, head is stopped up. Had fevers of 100 and 101. Has been taking tylenol, last took tylenol this morning around 5am.    Ms.Renie L Shillington is a 70 y.o. female, who is here today complaining of 3 days of respiratory symptoms.    Mild non productive cough.  + Fever, chill, and muscle aching.  + Nasal congestion, rhinorrhea, and post nasal drainage.  Denies chest pain, dyspnea,stridor, or skin rash. + Wheezing.  No Hx of recent travel. No sick contact. No known insect bite. No recent swimming in lakes, rivers, or public pools.  + Hx of allergies: allergic rhinitis and asthma listed on problems. She has used inhalers in the past for wheezing. She just completed 6 days of oral prednisone to treat exacerbation of RA and OA.  OTC medications for this problem: Tylenol. Symptoms otherwise stable.  History of hypertension, her BP is elevated today, she has checked BP at home and mildly elevated but cannot remember readings. She is not taking OTC cold medications.    Review of Systems  Constitutional: Positive for fatigue and fever. Negative for activity change and appetite change.  HENT: Positive for congestion and postnasal drip. Negative for ear pain, mouth sores, sinus pressure, sneezing, sore throat, trouble swallowing and voice change.   Eyes: Negative for discharge, redness and itching.  Respiratory: Positive for cough and wheezing. Negative for shortness of breath.   Cardiovascular: Negative for chest pain and palpitations.  Gastrointestinal: Negative for abdominal pain, diarrhea, nausea and vomiting.  Genitourinary: Negative for decreased urine volume, difficulty urinating, dysuria and hematuria.  Musculoskeletal: Positive for arthralgias (Hx of OA and RA) and myalgias. Negative for back pain, gait problem and neck pain.    Skin: Negative for pallor and rash.  Allergic/Immunologic: Negative for environmental allergies.  Neurological: Negative for syncope, weakness, numbness and headaches.  Hematological: Negative for adenopathy.      Current Outpatient Prescriptions on File Prior to Visit  Medication Sig Dispense Refill  . acetaminophen (TYLENOL) 500 MG tablet Take 500 mg by mouth 2 (two) times daily as needed (pain).    [name] Kitchen amLODipine (NORVASC) 2.5 MG tablet Take 1 tablet (2.5 mg total) by mouth daily. 90 tablet 3  . BLACK COHOSH PO Take by mouth.    . cyclobenzaprine (FLEXERIL) 5 MG tablet Take 1 tablet (5 mg total) by mouth 3 (three) times daily as needed for muscle spasms. 30 tablet 1  . hydroxychloroquine (PLAQUENIL) 200 MG tablet Take 2 tablets (400 mg total) by mouth daily. Resume on 11/10/14. Take 2 by mouth daily    . leflunomide (ARAVA) 20 MG tablet     . levothyroxine (SYNTHROID, LEVOTHROID) 50 MCG tablet Take 1 tablet (50 mcg total) by mouth daily. 100 tablet 3  . losartan-hydrochlorothiazide (HYZAAR) 100-12.5 MG tablet TAKE 1 TABLET BY MOUTH DAILY. 100 tablet 3  . ONETOUCH VERIO test strip USE ONCE DAILY FOR CHECK GLUCOSE LEVELS. DX E11.9 100 each 3  . pantoprazole (PROTONIX) 40 MG tablet TAKE 1 TABLET BY MOUTH EVERY DAY 100 tablet 1  . traMADol (ULTRAM) 50 MG tablet Take 2 tablets (100 mg total) by mouth every 6 (six) hours as needed for moderate pain. 60 tablet 0  . atorvastatin (LIPITOR) 20 MG tablet Take 1 tablet (20 mg total) by mouth daily. 100 tablet 3  No current facility-administered medications on file prior to visit.      Past Medical History:  Diagnosis Date  . Allergy   . Arthritis    RA (Dr. Ouida Sills)  . Chronic kidney disease    rt kidney removed  . Colon polyps 2008   HYPERPLASTIC  . Costochondritis   . Diabetes mellitus without complication ([place])    type 2  . Gastritis   . GERD (gastroesophageal reflux disease)   . Hx of cardiovascular stress test    Lexiscan  Myoview 6/16:  EF 70%, no scar or ischemia; Low Risk  . Hypertension   . Osteoarthritis   . Pneumonia    Hx: of  . S/P TAH (total abdominal hysterectomy)   . S/P TKR (total knee replacement) 09  . Shortness of breath    Hx: of with exertion  . Thyroid disease   . Tingling    Hx: of in feet  . Total knee replacement status 09/2007   redo   Allergies  Allergen Reactions  . Percocet [Oxycodone-Acetaminophen] Hives, Itching and Other (See Comments)    hallucinations  . Hydrocodone Other (See Comments)    "crazy dreams"  . Aspirin Other (See Comments)    REACTION: GI upset    Social History   Social History  . Marital status: Divorced    Spouse name: N/A  . Number of children: 3  . Years of education: N/A   Occupational History  . Retired Retired   Social History Main Topics  . Smoking status: Current Every Day Smoker    Years: 15.00    Types: Cigarettes  . Smokeless tobacco: Never Used     Comment: 2 cigarettes per day  . Alcohol use No  . Drug use: No  . Sexual activity: Not Asked   Other Topics Concern  . None   Social History Narrative   Single   Never Smoked    Alcohol use- no   Drug use-no   Regular Exercise-yes   Former Smoker- 12/2008    Vitals:   03/12/16 1332  BP: (!) 147/80  Pulse: 92  Resp: 12  Temp: 98.1 F (36.7 C)   Body mass index is 34.9 kg/m.  O2 sats at RA 98%.    Physical Exam  Constitutional: She is oriented to person, place, and time. She appears well-developed. She does not appear ill. No distress.  HENT:  Head: Atraumatic.  Right Ear: Hearing, tympanic membrane, external ear and ear canal normal.  Left Ear: Hearing, tympanic membrane, external ear and ear canal normal.  Nose: No mucosal edema or rhinorrhea. Right sinus exhibits no maxillary sinus tenderness and no frontal sinus tenderness. Left sinus exhibits no maxillary sinus tenderness and no frontal sinus tenderness.  Mouth/Throat: Oropharynx is clear and moist and  mucous membranes are normal.  Hypertrophic turbinates. Nasal voice. Postnasal drainage.  Eyes: Conjunctivae are normal.  Neck: No muscular tenderness present. No edema and no erythema present.  Cardiovascular: Normal rate and regular rhythm.   Murmur (SEM I/VI RUSB) heard. Respiratory: Effort normal and breath sounds normal. No stridor. No respiratory distress.  Musculoskeletal: She exhibits no edema.  Lymphadenopathy:       Head (right side): No submandibular adenopathy present.       Head (left side): No submandibular adenopathy present.    She has no cervical adenopathy.  Neurological: She is alert and oriented to person, place, and time.  Skin: Skin is warm. No rash noted. No erythema.  Psychiatric:  She has a normal mood and affect.  Well groomed, good eye contact.      ASSESSMENT AND PLAN:     Kerriana was seen today for sinus problem.  Diagnoses and all orders for this visit:  Reactive airway disease, mild intermittent, uncomplicated  Duoneb neb treatment given here in the office. No wheezing or rales on auscultation. Albuterol inh 2 puff qid for 1 week then as needed. Sine she just completed Prednisone course I did not recommend more oral steroids for now. Instructed about warning signs and f/u in 2-3 weeks, before if needed.  -     albuterol (PROVENTIL HFA;VENTOLIN HFA) 108 (90 Base) MCG/ACT inhaler; Inhale 2 puffs into the lungs every 6 (six) hours as needed for wheezing or shortness of breath.  URI, acute  Symptoms suggests a viral etiology, I explained patient that symptomatic treatment is usually recommended in this case, so I do not think abx is needed at this time. Instructed to monitor for signs of complications, persistent, dyspnea, etc. Clearly instructed about warning signs. I also explained that cough and nasal congestion can last a few days and sometimes weeks. F/U in 2-3 weeks.    -     ipratropium-albuterol (DUONEB) 0.5-2.5 (3) MG/3ML nebulizer  solution 3 mL; Take 3 mLs by nebulization once. -     fluticasone (FLONASE) 50 MCG/ACT nasal spray; Place 1 spray into both nostrils 2 (two) times daily as needed for allergies.  Essential hypertension  Re-checked and better. Continue monitoring BP. Avoid cold medications. No changes in current management. F/U with PCP in 2-3 weeks, before if needed.  She is aware of heart murmur, reporting prior Hx.       -Ms.[name] was advised to return or notify a doctor immediately if symptoms worsen or persist or new concerns arise.       [name]. [name], MD  [facility]. [place] office.

## 2016-03-12 NOTE — Progress Notes (Signed)
Pre visit review using our clinic review tool, if applicable. No additional management support is needed unless otherwise documented below in the visit note. 

## 2016-03-12 NOTE — Patient Instructions (Addendum)
  [name] I have seen you today for an acute visit because your primary care provider was not available. Monitor for signs of worsening symptoms and seek immediate medical attention if any concerning/warning symptom as we discussed. If symptoms are not resolved in 1-2 weeks you should schedule a follow up appointment with your doctor, before if symptoms get worse.   A few things to remember from today's visit:   Essential hypertension  URI, acute - Plan: ipratropium-albuterol (DUONEB) 0.5-2.5 (3) MG/3ML nebulizer solution 3 mL  Reactive airway disease, mild intermittent, uncomplicated  I think you have a viral illness. Viral infections are self-limited and we treat each symptom depending of severity.  Over the counter medications as decongestants and cold medications NOT recommended because high blood pressure. Tylenol  also helps with most symptoms (headache, muscle aching, fever,etc) Plenty of fluids. Honey helps with cough. Steam inhalations helps with runny nose, nasal congestion, and may prevent sinus infections. Cough and nasal congestion could last a few days and sometimes weeks. If still having fever after 2 days, please let me know thorough my chart, so we can arrange chest x-ray. Please follow  in 3 weeks, before if symptoms get worse.  Continue monitoring blood pressure at home. Smoking cessation is very important.

## 2016-03-26 DIAGNOSIS — M1990 Unspecified osteoarthritis, unspecified site: Secondary | ICD-10-CM | POA: Diagnosis not present

## 2016-03-26 DIAGNOSIS — M19042 Primary osteoarthritis, left hand: Secondary | ICD-10-CM | POA: Diagnosis not present

## 2016-03-26 DIAGNOSIS — M19041 Primary osteoarthritis, right hand: Secondary | ICD-10-CM | POA: Diagnosis not present

## 2016-03-26 DIAGNOSIS — M0609 Rheumatoid arthritis without rheumatoid factor, multiple sites: Secondary | ICD-10-CM | POA: Diagnosis not present

## 2016-03-26 DIAGNOSIS — Z79899 Other long term (current) drug therapy: Secondary | ICD-10-CM | POA: Diagnosis not present

## 2016-03-26 DIAGNOSIS — M25532 Pain in left wrist: Secondary | ICD-10-CM | POA: Diagnosis not present

## 2016-04-09 DIAGNOSIS — Z79899 Other long term (current) drug therapy: Secondary | ICD-10-CM | POA: Diagnosis not present

## 2016-04-09 DIAGNOSIS — M0609 Rheumatoid arthritis without rheumatoid factor, multiple sites: Secondary | ICD-10-CM | POA: Diagnosis not present

## 2016-04-09 DIAGNOSIS — M1990 Unspecified osteoarthritis, unspecified site: Secondary | ICD-10-CM | POA: Diagnosis not present

## 2016-04-09 DIAGNOSIS — M25532 Pain in left wrist: Secondary | ICD-10-CM | POA: Diagnosis not present

## 2016-04-19 DIAGNOSIS — Z0000 Encounter for general adult medical examination without abnormal findings: Secondary | ICD-10-CM | POA: Diagnosis not present

## 2016-04-19 DIAGNOSIS — I10 Essential (primary) hypertension: Secondary | ICD-10-CM | POA: Diagnosis not present

## 2016-04-19 DIAGNOSIS — M059 Rheumatoid arthritis with rheumatoid factor, unspecified: Secondary | ICD-10-CM | POA: Diagnosis not present

## 2016-04-19 DIAGNOSIS — K219 Gastro-esophageal reflux disease without esophagitis: Secondary | ICD-10-CM | POA: Diagnosis not present

## 2016-04-19 DIAGNOSIS — E784 Other hyperlipidemia: Secondary | ICD-10-CM | POA: Diagnosis not present

## 2016-04-19 DIAGNOSIS — E119 Type 2 diabetes mellitus without complications: Secondary | ICD-10-CM | POA: Diagnosis not present

## 2016-05-10 ENCOUNTER — Encounter: Payer: Self-pay | Admitting: Family Medicine

## 2016-05-10 ENCOUNTER — Ambulatory Visit (INDEPENDENT_AMBULATORY_CARE_PROVIDER_SITE_OTHER): Payer: Medicare HMO | Admitting: Family Medicine

## 2016-05-10 VITALS — BP 118/78 | HR 84 | Temp 98.2°F | Resp 16 | Ht 65.0 in | Wt 215.0 lb

## 2016-05-10 DIAGNOSIS — R05 Cough: Secondary | ICD-10-CM | POA: Diagnosis not present

## 2016-05-10 DIAGNOSIS — J209 Acute bronchitis, unspecified: Secondary | ICD-10-CM

## 2016-05-10 DIAGNOSIS — R059 Cough, unspecified: Secondary | ICD-10-CM

## 2016-05-10 MED ORDER — BENZONATATE 100 MG PO CAPS: 100.0000 mg | ORAL_CAPSULE | Freq: Three times a day (TID) | ORAL | 0 refills | Status: DC

## 2016-05-10 MED ORDER — PREDNISONE 10 MG PO TABS
ORAL_TABLET | ORAL | 0 refills | Status: DC
Start: 2016-05-10 — End: 2016-06-08

## 2016-05-10 NOTE — Patient Instructions (Signed)
Please take prednisone as prescribed and you may use Delsym as needed for cough or benzonatate for cough that is not controlled with Delsym.  Your symptoms today are most likely caused by viral illness. Please drink plenty of water enough for your urine to be pale yellow or clear.  Follow-up for evaluation if your symptoms do not improve in 3-4 days, worsen, or you develop a fever greater than 100.   Acute Bronchitis Bronchitis is inflammation of the airways that extend from the windpipe into the lungs (bronchi). The inflammation often causes mucus to develop. This leads to a cough, which is the most common symptom of bronchitis.  In acute bronchitis, the condition usually develops suddenly and goes away over time, usually in a couple weeks. Smoking, allergies, and asthma can make bronchitis worse. Repeated episodes of bronchitis may cause further lung problems.  CAUSES Acute bronchitis is most often caused by the same virus that causes a cold. The virus can spread from person to person (contagious) through coughing, sneezing, and touching contaminated objects. SIGNS AND SYMPTOMS   Cough.   Fever.   Coughing up mucus.   Body aches.   Chest congestion.   Chills.   Shortness of breath.   Sore throat.  DIAGNOSIS  Acute bronchitis is usually diagnosed through a physical exam. Your health care provider will also ask you questions about your medical history. Tests, such as chest X-rays, are sometimes done to rule out other conditions.  TREATMENT  Acute bronchitis usually goes away in a couple weeks. Oftentimes, no medical treatment is necessary. Medicines are sometimes given for relief of fever or cough. Antibiotic medicines are usually not needed but may be prescribed in certain situations. In some cases, an inhaler may be recommended to help reduce shortness of breath and control the cough. A cool mist vaporizer may also be used to help thin bronchial secretions and make it easier to  clear the chest.  HOME CARE INSTRUCTIONS  Get plenty of rest.   Drink enough fluids to keep your urine clear or pale yellow (unless you have a medical condition that requires fluid restriction). Increasing fluids may help thin your respiratory secretions (sputum) and reduce chest congestion, and it will prevent dehydration.   Take medicines only as directed by your health care provider.  If you were prescribed an antibiotic medicine, finish it all even if you start to feel better.  Avoid smoking and secondhand smoke. Exposure to cigarette smoke or irritating chemicals will make bronchitis worse. If you are a smoker, consider using nicotine gum or skin patches to help control withdrawal symptoms. Quitting smoking will help your lungs heal faster.   Reduce the chances of another bout of acute bronchitis by washing your hands frequently, avoiding people with cold symptoms, and trying not to touch your hands to your mouth, nose, or eyes.   Keep all follow-up visits as directed by your health care provider.  SEEK MEDICAL CARE IF: Your symptoms do not improve after 1 week of treatment.  SEEK IMMEDIATE MEDICAL CARE IF:  You develop an increased fever or chills.   You have chest pain.   You have severe shortness of breath.  You have bloody sputum.   You develop dehydration.  You faint or repeatedly feel like you are going to pass out.  You develop repeated vomiting.  You develop a severe headache. MAKE SURE YOU:   Understand these instructions.  Will watch your condition.  Will get help right away if you are  not doing well or get worse.   This information is not intended to replace advice given to you by your health care provider. Make sure you discuss any questions you have with your health care provider.   Document Released: 08/26/2004 Document Revised: 08/09/2014 Document Reviewed: 01/09/2013 Elsevier Interactive Patient Education [employer].

## 2016-05-10 NOTE — Progress Notes (Signed)
Subjective:    Patient ID: [name], female    DOB: [birth year], 70 y.o.   MRN: [id number]  HPI  [name] is a 70 year old female who presents today with mild sinus pressure/pain for 5 days ago. She states that her symptoms are progressively getting worse and she stayed in bed all day yesterday however she reports that her symptoms have improved today.      Associated symptoms of sneezing, fever, nasal congestion, rhinitis, cough productive white sputum, and post nasal drainage. She denies chest pain, palpitations, SOB, or rash Treatment at home includes claritin, acetaminophen, and delsym has provided limited benefit. No history of asthma/bronchitis, no recent sick contact exposure or recent antibiotic use. History of allergies is noted.  Review of Systems  Constitutional: Positive for chills, fatigue and fever.  HENT: Positive for congestion, postnasal drip, rhinorrhea, sinus pressure and sneezing. Negative for sore throat.   Respiratory: Positive for cough and wheezing. Negative for shortness of breath.   Cardiovascular: Negative for chest pain and palpitations.  Gastrointestinal: Negative for abdominal pain, constipation, diarrhea, nausea and vomiting.  Genitourinary: Negative for dysuria and frequency.  Musculoskeletal: Negative for arthralgias and myalgias.  Skin: Negative for rash.  Neurological: Negative for dizziness, weakness, light-headedness, numbness and headaches.   Past Medical History:  Diagnosis Date  . Allergy   . Arthritis    RA (Dr. Ouida Sills)  . Chronic kidney disease    rt kidney removed  . Colon polyps 2008   HYPERPLASTIC  . Costochondritis   . Diabetes mellitus without complication ([place])    type 2  . Gastritis   . GERD (gastroesophageal reflux disease)   . Hx of cardiovascular stress test    Lexiscan Myoview 6/16:  EF 70%, no scar or ischemia; Low Risk  . Hypertension   . Osteoarthritis   . Pneumonia    Hx: of  . S/P TAH (total abdominal  hysterectomy)   . S/P TKR (total knee replacement) 09  . Shortness of breath    Hx: of with exertion  . Thyroid disease   . Tingling    Hx: of in feet  . Total knee replacement status 09/2007   redo     Social History   Social History  . Marital status: Divorced    Spouse name: N/A  . Number of children: 3  . Years of education: N/A   Occupational History  . Retired Retired   Social History Main Topics  . Smoking status: Current Every Day Smoker    Years: 15.00    Types: Cigarettes  . Smokeless tobacco: Never Used     Comment: 2 cigarettes per day  . Alcohol use No  . Drug use: No  . Sexual activity: Not on file   Other Topics Concern  . Not on file   Social History Narrative   Single   Never Smoked    Alcohol use- no   Drug use-no   Regular Exercise-yes   Former Smoker- 12/2008    Past Surgical History:  Procedure Laterality Date  . ABDOMINAL HYSTERECTOMY  1978  . BACK SURGERY     x 2  . CARPAL TUNNEL RELEASE Left   . COLONOSCOPY W/ BIOPSIES AND POLYPECTOMY     Hx: of  . HNP    . LUMBAR LAMINECTOMY/DECOMPRESSION MICRODISCECTOMY Left 02/12/2013   Procedure: LUMBAR TWO THREE, LUMBAR THREE FOUR, LUMBAR FOUR FIVE  LAMINECTOMY/DECOMPRESSION MICRODISCECTOMY 3 LEVELS;  Surgeon: Charlie Pitter, MD;  Location: [facility]  NEURO ORS;  Service: Neurosurgery;  Laterality: Left;  . NEPHRECTOMY Right 2010   10.rcc cancer  . TOTAL KNEE ARTHROPLASTY Right     Family History  Problem Relation Age of Onset  . Rheum arthritis Mother   . Stroke Mother   . Prostate cancer Father   . Heart disease Father   . Diabetes Brother     x 2  . Kidney disease Son   . Colon cancer Neg Hx   . Esophageal cancer Neg Hx   . Pancreatic cancer Neg Hx   . Liver disease Neg Hx     Allergies  Allergen Reactions  . Percocet [Oxycodone-Acetaminophen] Hives, Itching and Other (See Comments)    hallucinations  . Hydrocodone Other (See Comments)    "crazy dreams"  . Aspirin Other (See Comments)     REACTION: GI upset    Current Outpatient Prescriptions on File Prior to Visit  Medication Sig Dispense Refill  . acetaminophen (TYLENOL) 500 MG tablet Take 500 mg by mouth 2 (two) times daily as needed (pain).    [name] Kitchen amLODipine (NORVASC) 2.5 MG tablet Take 1 tablet (2.5 mg total) by mouth daily. 90 tablet 3  . BLACK COHOSH PO Take by mouth.    . cyclobenzaprine (FLEXERIL) 5 MG tablet Take 1 tablet (5 mg total) by mouth 3 (three) times daily as needed for muscle spasms. 30 tablet 1  . fluticasone (FLONASE) 50 MCG/ACT nasal spray Place 1 spray into both nostrils 2 (two) times daily as needed for allergies. 16 g 1  . leflunomide (ARAVA) 20 MG tablet     . losartan-hydrochlorothiazide (HYZAAR) 100-12.5 MG tablet TAKE 1 TABLET BY MOUTH DAILY. 100 tablet 3  . ONETOUCH VERIO test strip USE ONCE DAILY FOR CHECK GLUCOSE LEVELS. DX E11.9 100 each 3  . pantoprazole (PROTONIX) 40 MG tablet TAKE 1 TABLET BY MOUTH EVERY DAY 100 tablet 1  . traMADol (ULTRAM) 50 MG tablet Take 2 tablets (100 mg total) by mouth every 6 (six) hours as needed for moderate pain. 60 tablet 0   No current facility-administered medications on file prior to visit.     BP 118/78 (BP Location: Left Arm, Patient Position: Sitting, Cuff Size: Normal)   Pulse 84   Temp 98.2 F (36.8 C) (Oral)   Resp 16   Ht 5\' 5"  (1.651 m)   Wt 215 lb (97.5 kg)   SpO2 97%   BMI 35.78 kg/m        Objective:   Physical Exam  Constitutional: She is oriented to person, place, and time. She appears well-developed and well-nourished.  HENT:  Right Ear: Tympanic membrane normal.  Left Ear: Tympanic membrane normal.  Nose: Rhinorrhea present.  Mouth/Throat: Mucous membranes are normal. No oropharyngeal exudate or posterior oropharyngeal erythema.  Eyes: Pupils are equal, round, and reactive to light. No scleral icterus.  Neck: Neck supple.  Cardiovascular: Normal rate and regular rhythm.   Murmur heard. Systolic ejection murmur Grade I;  RUSB. Not a new finding  Pulmonary/Chest: Effort normal. She has wheezes.  Abdominal: Soft. Bowel sounds are normal. There is no tenderness.  Musculoskeletal: She exhibits no edema.  Neurological: She is alert and oriented to person, place, and time. Coordination normal.  Skin: Skin is warm and dry. No rash noted.       Assessment & Plan:  1. Acute bronchitis, unspecified organism Duoneb provided in office. No wheezing auscultated after completion of treatment. Short course of prednisone provided. Advised her regarding signs for  follow up such as worsening symptoms, fever >100, or any new symptoms. Advised her that her remaining cough may persist for days to even weeks. Recommended follow up in 2 weeks. - predniSONE (DELTASONE) 10 MG tablet; Take 4 tablets once daily for 2 days, 3 tabs daily for 2 days, 2 tabs daily for 2 days, 1 tab daily for 2 days.  Dispense: 20 tablet; Refill: 0  2. Cough Delsym as needed or benzonatate for cough not controlled with Delsym.  - benzonatate (TESSALON) 100 MG capsule; Take 1 capsule (100 mg total) by mouth 3 (three) times daily.  Dispense: 20 capsule; Refill: 0  Delano Metz, FNP-C  Advised patient on supportive measures:  Get rest, drink plenty of fluids, and follow up if fever >101, if symptoms worsen or if symptoms are not improved in 3 to 4 days. Patient verbalizes understanding.   Delano Metz, FNP-C

## 2016-05-24 ENCOUNTER — Telehealth: Payer: Self-pay | Admitting: Family Medicine

## 2016-05-24 NOTE — Telephone Encounter (Signed)
Pt would like for you call her in antibiotic due to her not feeling any better.  Pharm:  [facility]

## 2016-05-25 NOTE — Telephone Encounter (Signed)
Recommend that she return to office for further evaluation of symptoms so appropriate therapy can be started.

## 2016-05-25 NOTE — Telephone Encounter (Signed)
Pt was seen in the office on 05/10/16.  Is antibiotic apporiate or is an appointment needed?

## 2016-05-26 NOTE — Telephone Encounter (Signed)
Spoke with patient. Pt states that she has to come to the office Monday to do blood work for her physical next week with Dr. Sherren Mocha and she will just wait an address this concern with Dr.Todd. Nothing further needed at this time.

## 2016-05-26 NOTE — Telephone Encounter (Signed)
Called an left message for pt to return call to office.

## 2016-06-01 ENCOUNTER — Other Ambulatory Visit (INDEPENDENT_AMBULATORY_CARE_PROVIDER_SITE_OTHER): Payer: Medicare HMO

## 2016-06-01 DIAGNOSIS — Z0000 Encounter for general adult medical examination without abnormal findings: Secondary | ICD-10-CM

## 2016-06-01 LAB — HEPATIC FUNCTION PANEL
ALT: 23 U/L (ref 0–35)
AST: 25 U/L (ref 0–37)
Albumin: 3.8 g/dL (ref 3.5–5.2)
Alkaline Phosphatase: 67 U/L (ref 39–117)
Bilirubin, Direct: 0.1 mg/dL (ref 0.0–0.3)
Total Bilirubin: 0.6 mg/dL (ref 0.2–1.2)
Total Protein: 7 g/dL (ref 6.0–8.3)

## 2016-06-01 LAB — BASIC METABOLIC PANEL
BUN: 17 mg/dL (ref 6–23)
CO2: 29 mEq/L (ref 19–32)
Calcium: 9.9 mg/dL (ref 8.4–10.5)
Chloride: 105 mEq/L (ref 96–112)
Creatinine, Ser: 1.02 mg/dL (ref 0.40–1.20)
GFR: 68.44 mL/min (ref >60.00)
Glucose, Bld: 94 mg/dL (ref 70–99)
Potassium: 3.9 mEq/L (ref 3.5–5.1)
Sodium: 143 mEq/L (ref 135–145)

## 2016-06-01 LAB — POC URINALSYSI DIPSTICK (AUTOMATED)
Bilirubin, UA: NEGATIVE
Blood, UA: NEGATIVE
Glucose, UA: NEGATIVE
Ketones, UA: NEGATIVE
Leukocytes, UA: NEGATIVE
Nitrite, UA: NEGATIVE
Spec Grav, UA: 1.02
Urobilinogen, UA: 0.2
pH, UA: 5.5

## 2016-06-01 LAB — MICROALBUMIN / CREATININE URINE RATIO
Creatinine,U: 167 mg/dL
Microalb Creat Ratio: 21.8 mg/g (ref 0.0–30.0)
Microalb, Ur: 36.4 mg/dL — ABNORMAL HIGH (ref 0.0–1.9)

## 2016-06-01 LAB — CBC WITH DIFFERENTIAL/PLATELET
Basophils Absolute: 0 10*3/uL (ref 0.0–0.1)
Basophils Relative: 1 % (ref 0.0–3.0)
Eosinophils Absolute: 0.5 10*3/uL (ref 0.0–0.7)
Eosinophils Relative: 10.5 % — ABNORMAL HIGH (ref 0.0–5.0)
HCT: 39.9 % (ref 36.0–46.0)
Hemoglobin: 13.5 g/dL (ref 12.0–15.0)
Lymphocytes Relative: 42 % (ref 12.0–46.0)
Lymphs Abs: 2.2 10*3/uL (ref 0.7–4.0)
MCHC: 33.8 g/dL (ref 30.0–36.0)
MCV: 88.6 fl (ref 78.0–100.0)
Monocytes Absolute: 0.5 10*3/uL (ref 0.1–1.0)
Monocytes Relative: 9.6 % (ref 3.0–12.0)
Neutro Abs: 1.9 10*3/uL (ref 1.4–7.7)
Neutrophils Relative %: 36.9 % — ABNORMAL LOW (ref 43.0–77.0)
Platelets: 191 10*3/uL (ref 150.0–400.0)
RBC: 4.5 Mil/uL (ref 3.87–5.11)
RDW: 15.3 % (ref 11.5–15.5)
WBC: 5.1 10*3/uL (ref 4.0–10.5)

## 2016-06-01 LAB — LIPID PANEL
Cholesterol: 262 mg/dL — ABNORMAL HIGH (ref 0–200)
HDL: 64 mg/dL (ref >39.00)
LDL Cholesterol: 173 mg/dL — ABNORMAL HIGH (ref 0–99)
NonHDL: 198.32
Total CHOL/HDL Ratio: 4
Triglycerides: 129 mg/dL (ref 0.0–149.0)
VLDL: 25.8 mg/dL (ref 0.0–40.0)

## 2016-06-01 LAB — HEMOGLOBIN A1C: Hgb A1c MFr Bld: 5.8 % (ref 4.6–6.5)

## 2016-06-01 LAB — TSH: TSH: 1.97 u[IU]/mL (ref 0.35–4.50)

## 2016-06-02 DIAGNOSIS — Z1231 Encounter for screening mammogram for malignant neoplasm of breast: Secondary | ICD-10-CM | POA: Diagnosis not present

## 2016-06-02 LAB — HM MAMMOGRAPHY

## 2016-06-08 ENCOUNTER — Encounter: Payer: Self-pay | Admitting: Family Medicine

## 2016-06-08 ENCOUNTER — Ambulatory Visit (INDEPENDENT_AMBULATORY_CARE_PROVIDER_SITE_OTHER): Payer: Medicare HMO | Admitting: Family Medicine

## 2016-06-08 VITALS — BP 120/80 | HR 86 | Temp 98.2°F | Ht 65.0 in | Wt 215.6 lb

## 2016-06-08 DIAGNOSIS — Z0001 Encounter for general adult medical examination with abnormal findings: Secondary | ICD-10-CM | POA: Diagnosis not present

## 2016-06-08 DIAGNOSIS — K2100 Gastro-esophageal reflux disease with esophagitis, without bleeding: Secondary | ICD-10-CM

## 2016-06-08 DIAGNOSIS — Z0000 Encounter for general adult medical examination without abnormal findings: Secondary | ICD-10-CM

## 2016-06-08 DIAGNOSIS — I10 Essential (primary) hypertension: Secondary | ICD-10-CM

## 2016-06-08 DIAGNOSIS — Z23 Encounter for immunization: Secondary | ICD-10-CM

## 2016-06-08 DIAGNOSIS — Z8639 Personal history of other endocrine, nutritional and metabolic disease: Secondary | ICD-10-CM

## 2016-06-08 DIAGNOSIS — E785 Hyperlipidemia, unspecified: Secondary | ICD-10-CM

## 2016-06-08 DIAGNOSIS — E139 Other specified diabetes mellitus without complications: Secondary | ICD-10-CM

## 2016-06-08 DIAGNOSIS — J069 Acute upper respiratory infection, unspecified: Secondary | ICD-10-CM

## 2016-06-08 DIAGNOSIS — J3089 Other allergic rhinitis: Secondary | ICD-10-CM

## 2016-06-08 DIAGNOSIS — M059 Rheumatoid arthritis with rheumatoid factor, unspecified: Secondary | ICD-10-CM

## 2016-06-08 MED ORDER — LOSARTAN POTASSIUM-HCTZ 100-12.5 MG PO TABS: 1.0000 | ORAL_TABLET | Freq: Every day | ORAL | 3 refills | Status: DC

## 2016-06-08 MED ORDER — AMLODIPINE BESYLATE 2.5 MG PO TABS: 2.5000 mg | ORAL_TABLET | Freq: Every day | ORAL | 3 refills | Status: DC

## 2016-06-08 MED ORDER — FLUTICASONE PROPIONATE 50 MCG/ACT NA SUSP: 1.0000 | Freq: Two times a day (BID) | NASAL | 7 refills | Status: DC | PRN

## 2016-06-08 MED ORDER — CYCLOBENZAPRINE HCL 5 MG PO TABS: 5.0000 mg | ORAL_TABLET | Freq: Three times a day (TID) | ORAL | 1 refills | Status: DC | PRN

## 2016-06-08 MED ORDER — PANTOPRAZOLE SODIUM 40 MG PO TBEC: 40.0000 mg | DELAYED_RELEASE_TABLET | Freq: Every day | ORAL | 3 refills | Status: DC

## 2016-06-08 NOTE — Progress Notes (Signed)
Pre visit review using our clinic review tool, if applicable. No additional management support is needed unless otherwise documented below in the visit note. 

## 2016-06-08 NOTE — Progress Notes (Signed)
[name] is a 70 year old female nonsmoker who comes in today for general physical examination because of a history of rheumatoid arthritis, hypertension, chronic intermittent low back pain, allergic rhinitis, reflux esophagitis with a history of esophageal stricture, obesity.  She takes Norvasc 2.5 mg daily along with Hyzaar 100-12 0.5. BP 120/80 compliant with medicine  She takes Flexeril when necessary for low back pain  He uses Flonase for allergic rhinitis  She takes Protonix 40 mg daily for chronic reflux. She's had her esophagus stretched because of the stricture. She says she is swallowing okay. She's on medication spelled ARA VA 20 mg her rheumatologist because of rheumatoid arthritis.  She was diabetic in the past blood sugar now is normal despite the fact her weight is 215 pounds. Unchanged over the last year. BMI 35.90. Random fasting blood sugar 98  She gets routine eye care, dental care, colonoscopy 2016 normal, she does BSE monthly, annual mammography,.  She had her uterus removed many years ago for nonmalignant reasons ovaries were left intact. No symptoms of ovarian dysfunction bloating etc. No family history of ovarian cancer.  Vaccination history up-to-date seasonal flu shot given today.  Social history single she does not exercise on a daily basis. She's had a right knee replaced because of severe arthritis.  Cognitive function normal she does not exercise daily home health safety reviewed no issues identified, no guns in the house, she does have a healthcare power of attorney and living well  14 point review of systems reviewed and negative except for above  Physical examination.vs BP 120/80   Pulse 86   Temp 98.2 F (36.8 C) (Oral)   Ht 5\' 5"  (1.651 m)   Wt 215 lb 9.6 oz (97.8 kg)   SpO2 97%   BMI 35.88 kg/m  HEENT were negative neck was supple no adenopathy thyroid normal no carotid bruits cardiopulmonary exam normal breast exam normal abdominal exam normal pelvic  deferred extremities normal skin normal peripheral pulses normal  #1 obesity..... Again as we have before counseled about diet exercise and weight loss  #2 rheumatoid arthritis........ continue current medication followed by rheumatology  #3 hypertension........ continue current medications monitor BP weekly at home  #4 history of diabetes......[name] Kitchen blood sugar normal...[name] KitchenMarland KitchenMarland Kitchen monitor weekly blood sugars  #5 history of reflux esophagitis with esophageal stricture status post dilatation........[name] Kitchen refill proton X  #6 allergic rhinitis.........[name] Kitchen refill Flonase.

## 2016-06-08 NOTE — Patient Instructions (Signed)
Carbohydrate free diet  Walk 30 minutes daily  Follow-up in 6 months........... nonfasting labs one week prior........ set up with Margit Banda or Dr. [name] for follow-up in 6 months and for long-term care  Continue other medications as outlined  Check your blood pressure and blood sugar once weekly at home

## 2016-06-15 ENCOUNTER — Encounter: Payer: Self-pay | Admitting: Family Medicine

## 2016-06-17 DIAGNOSIS — M25532 Pain in left wrist: Secondary | ICD-10-CM | POA: Diagnosis not present

## 2016-06-17 DIAGNOSIS — M0609 Rheumatoid arthritis without rheumatoid factor, multiple sites: Secondary | ICD-10-CM | POA: Diagnosis not present

## 2016-06-17 DIAGNOSIS — Z79899 Other long term (current) drug therapy: Secondary | ICD-10-CM | POA: Diagnosis not present

## 2016-06-17 DIAGNOSIS — M1990 Unspecified osteoarthritis, unspecified site: Secondary | ICD-10-CM | POA: Diagnosis not present

## 2016-07-10 ENCOUNTER — Other Ambulatory Visit: Payer: Self-pay | Admitting: Family Medicine

## 2016-07-12 DIAGNOSIS — R69 Illness, unspecified: Secondary | ICD-10-CM | POA: Diagnosis not present

## 2016-07-14 DIAGNOSIS — Z0100 Encounter for examination of eyes and vision without abnormal findings: Secondary | ICD-10-CM | POA: Diagnosis not present

## 2016-07-14 DIAGNOSIS — E119 Type 2 diabetes mellitus without complications: Secondary | ICD-10-CM | POA: Diagnosis not present

## 2016-07-14 DIAGNOSIS — H43813 Vitreous degeneration, bilateral: Secondary | ICD-10-CM | POA: Diagnosis not present

## 2016-07-14 DIAGNOSIS — Z79899 Other long term (current) drug therapy: Secondary | ICD-10-CM | POA: Diagnosis not present

## 2016-07-14 LAB — HM DIABETES EYE EXAM

## 2016-08-11 IMAGING — DX DG ABDOMEN 2V
3 series · 3 of 3 positions shown · non-contrast
Comparison: 10/24/2014

CLINICAL DATA: Nausea, vomiting, diarrhea

EXAM:
ABDOMEN - 2 VIEW

[abdomen erect]
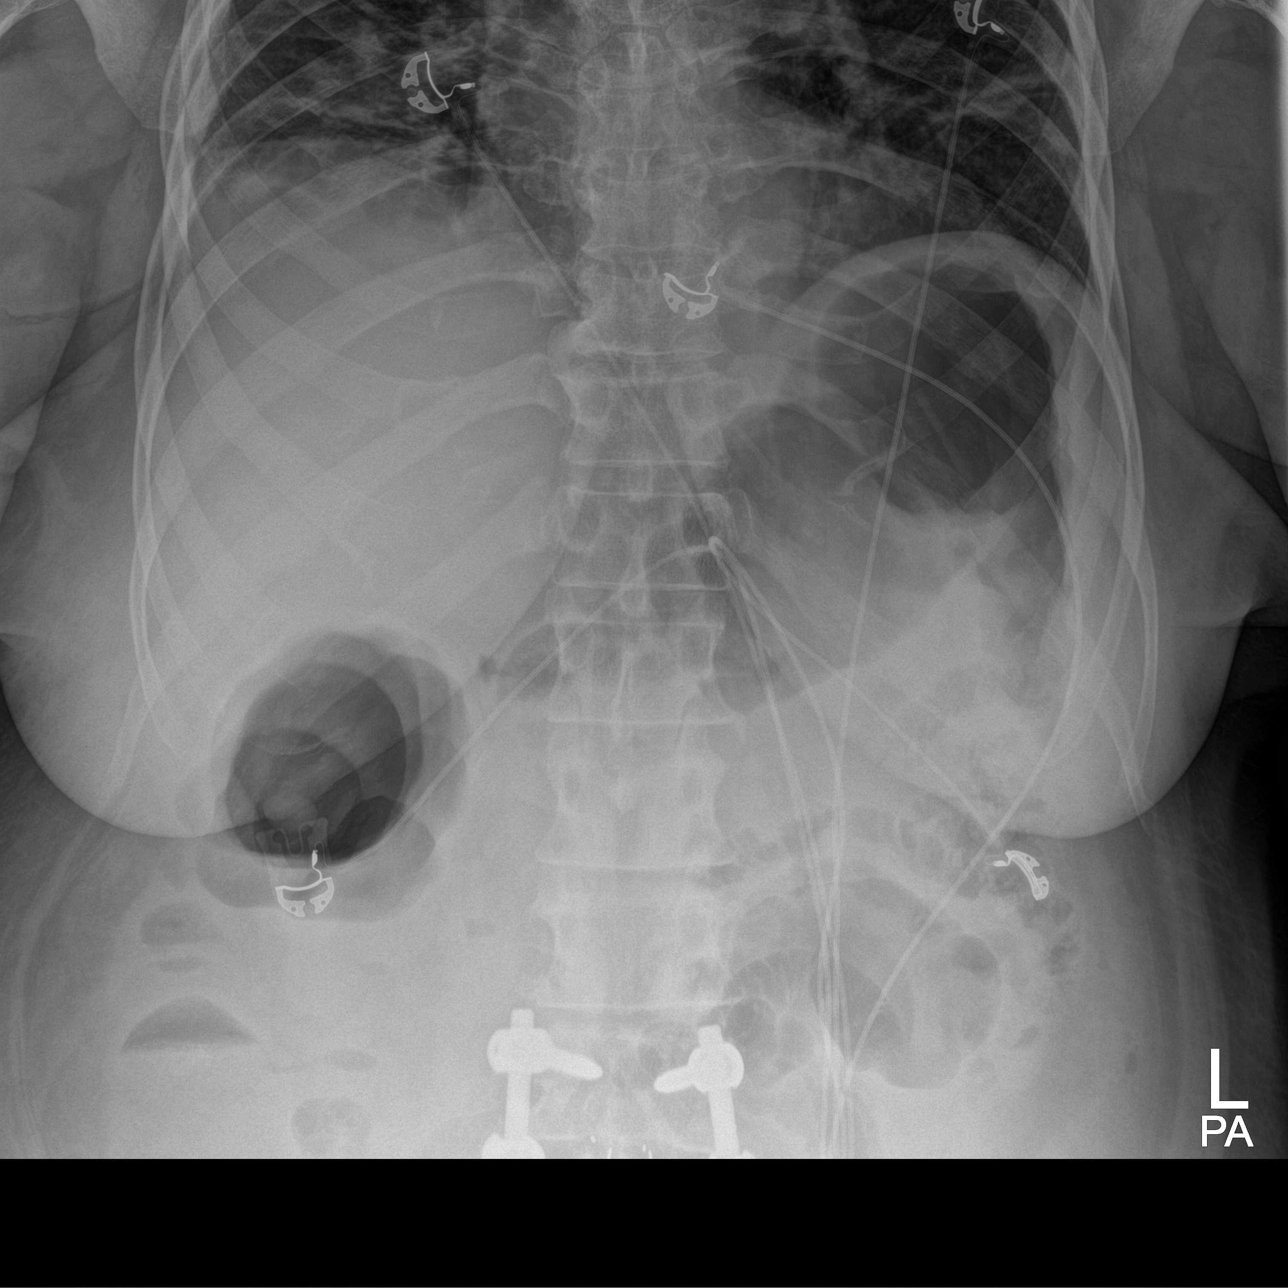

[abdomen supine (1 of 2)]
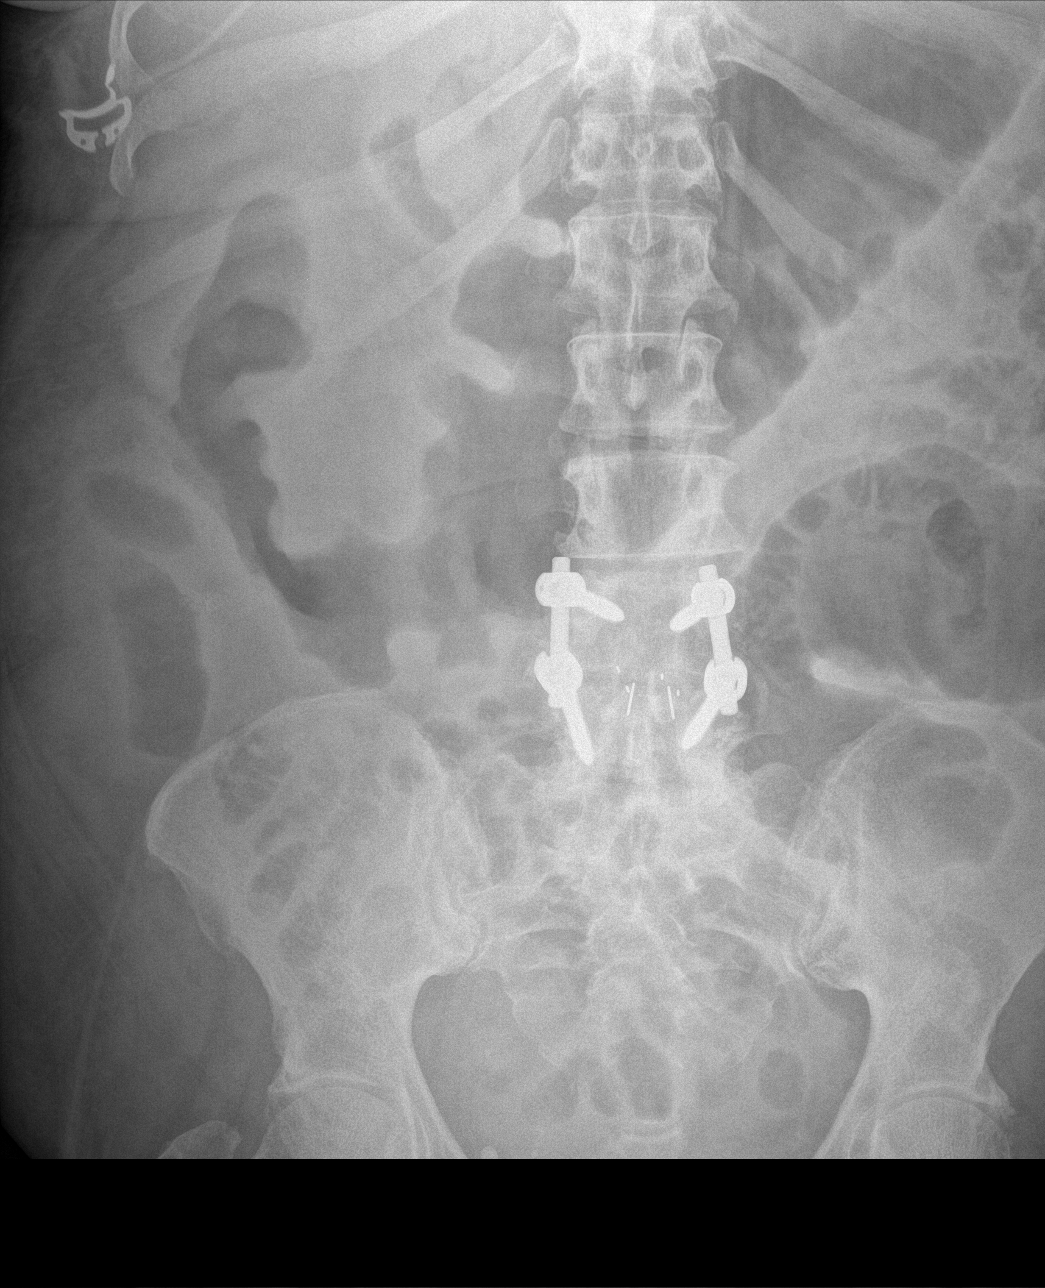

[abdomen supine (2 of 2)]
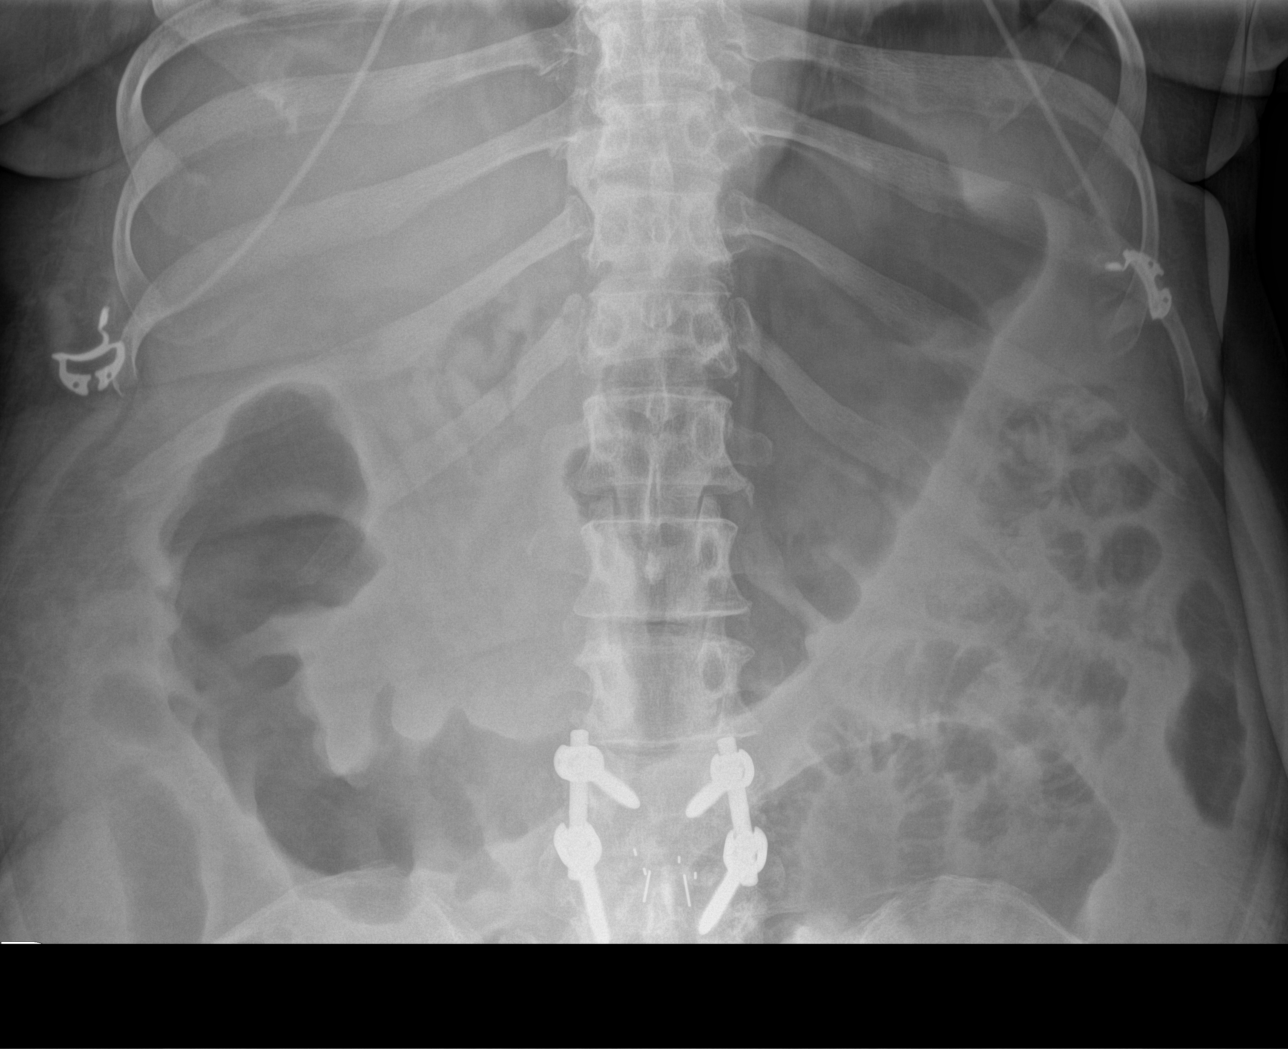

[3 of 3 positions shown; findings below may reference images not displayed]

FINDINGS: Persistent gaseous distension of the colon without significant
change from prior exam. Mild distended small bowel loops in left
abdomen probable mild ileus. No free abdominal air.
IMPRESSION: Persistent gaseous distension of the colon stable from prior exam.
Mild distended small bowel loops in left abdomen probable mild
ileus.

## 2016-08-12 ENCOUNTER — Ambulatory Visit: Payer: Medicare HMO | Admitting: Physician Assistant

## 2016-08-12 ENCOUNTER — Ambulatory Visit (INDEPENDENT_AMBULATORY_CARE_PROVIDER_SITE_OTHER): Payer: Medicare HMO | Admitting: Family Medicine

## 2016-08-12 ENCOUNTER — Encounter: Payer: Self-pay | Admitting: Family Medicine

## 2016-08-12 VITALS — BP 150/90 | HR 90 | Temp 98.6°F | Resp 12 | Ht 65.0 in | Wt 212.0 lb

## 2016-08-12 DIAGNOSIS — I10 Essential (primary) hypertension: Secondary | ICD-10-CM

## 2016-08-12 DIAGNOSIS — J069 Acute upper respiratory infection, unspecified: Secondary | ICD-10-CM

## 2016-08-12 DIAGNOSIS — R509 Fever, unspecified: Secondary | ICD-10-CM | POA: Diagnosis not present

## 2016-08-12 LAB — POCT INFLUENZA A/B
Influenza A, POC: NEGATIVE
Influenza B, POC: NEGATIVE

## 2016-08-12 IMAGING — CR DG ABDOMEN 2V
2 series · 2 of 2 positions shown · non-contrast
Comparison: 10/26/2014

CLINICAL DATA: Ileus

EXAM:
ABDOMEN - 2 VIEW

[w abdomen upright]
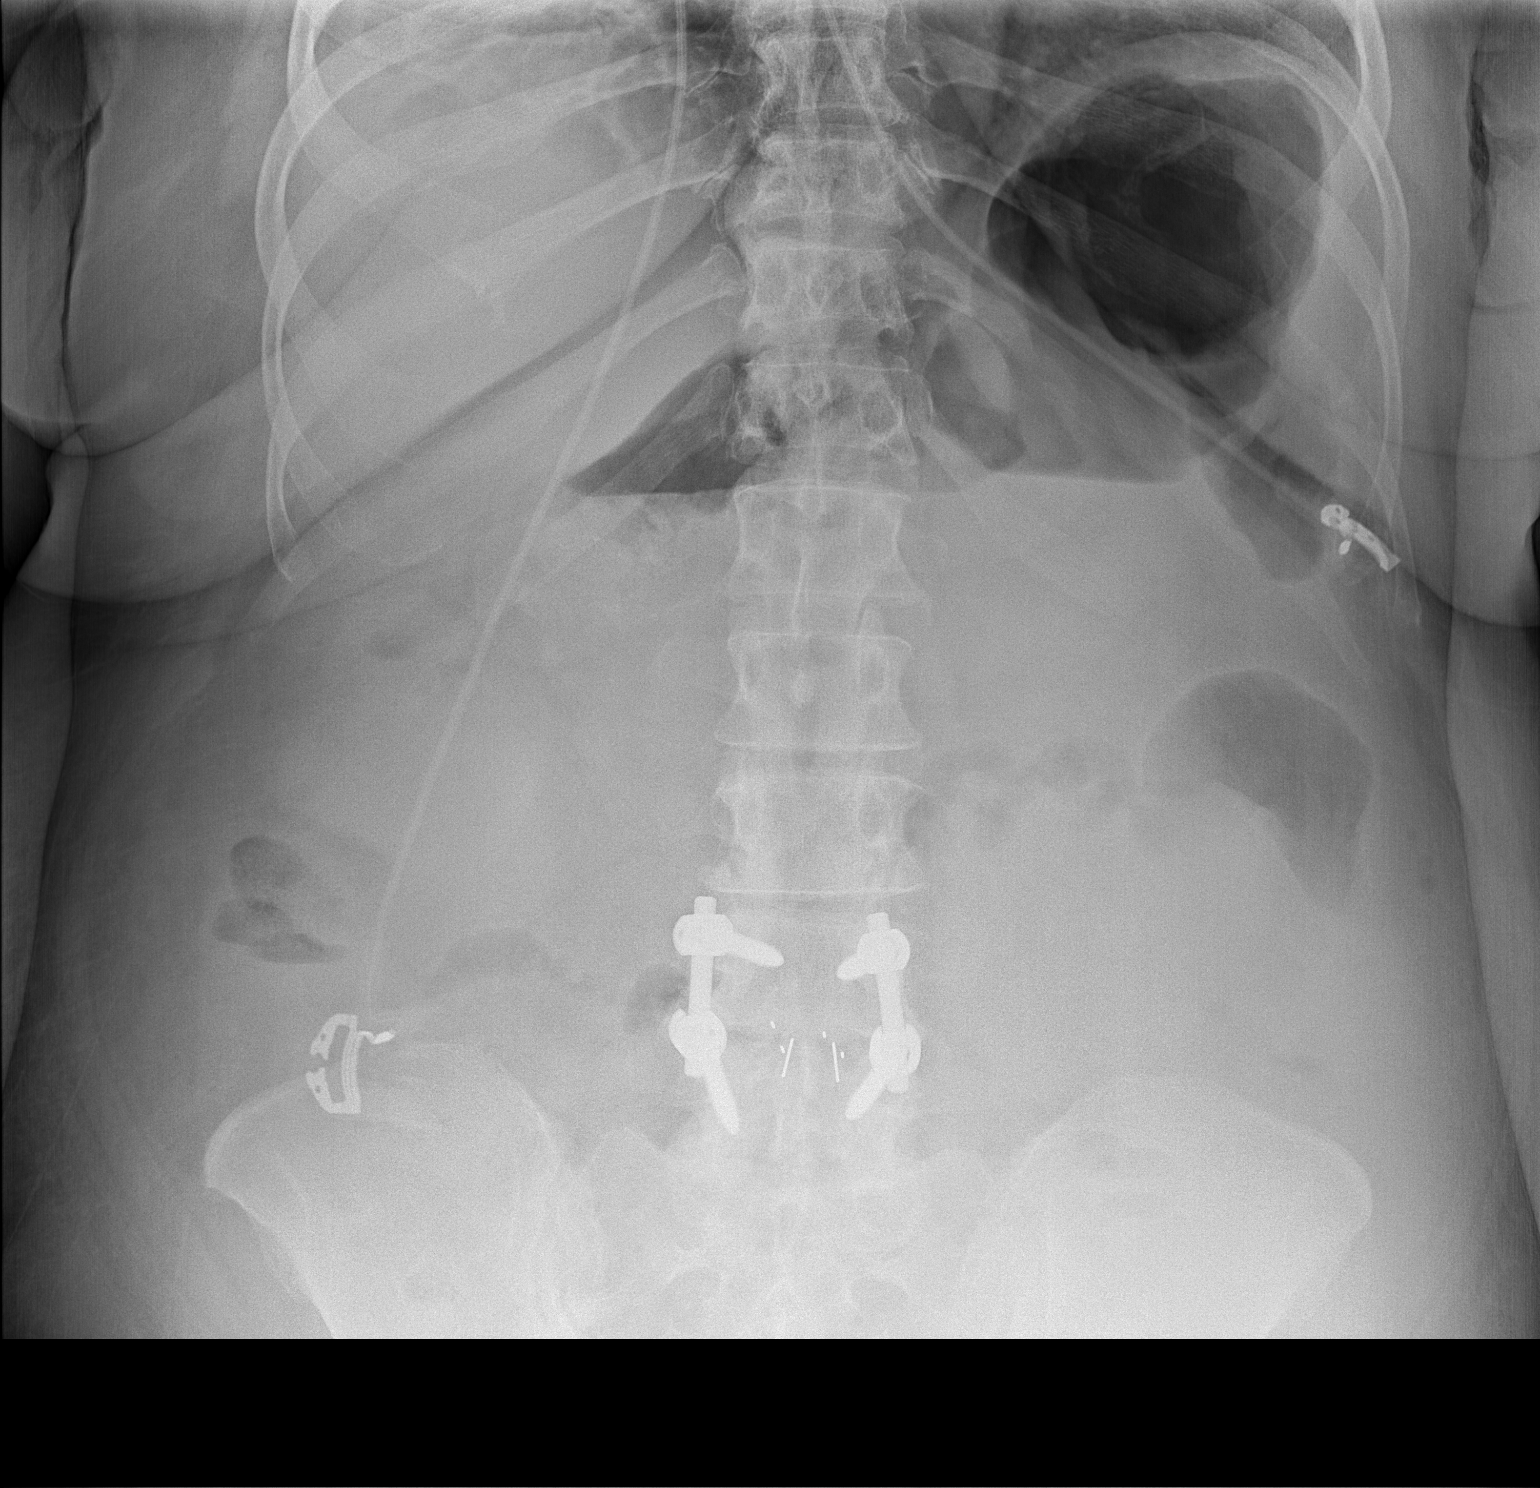

[t abdomen supine]
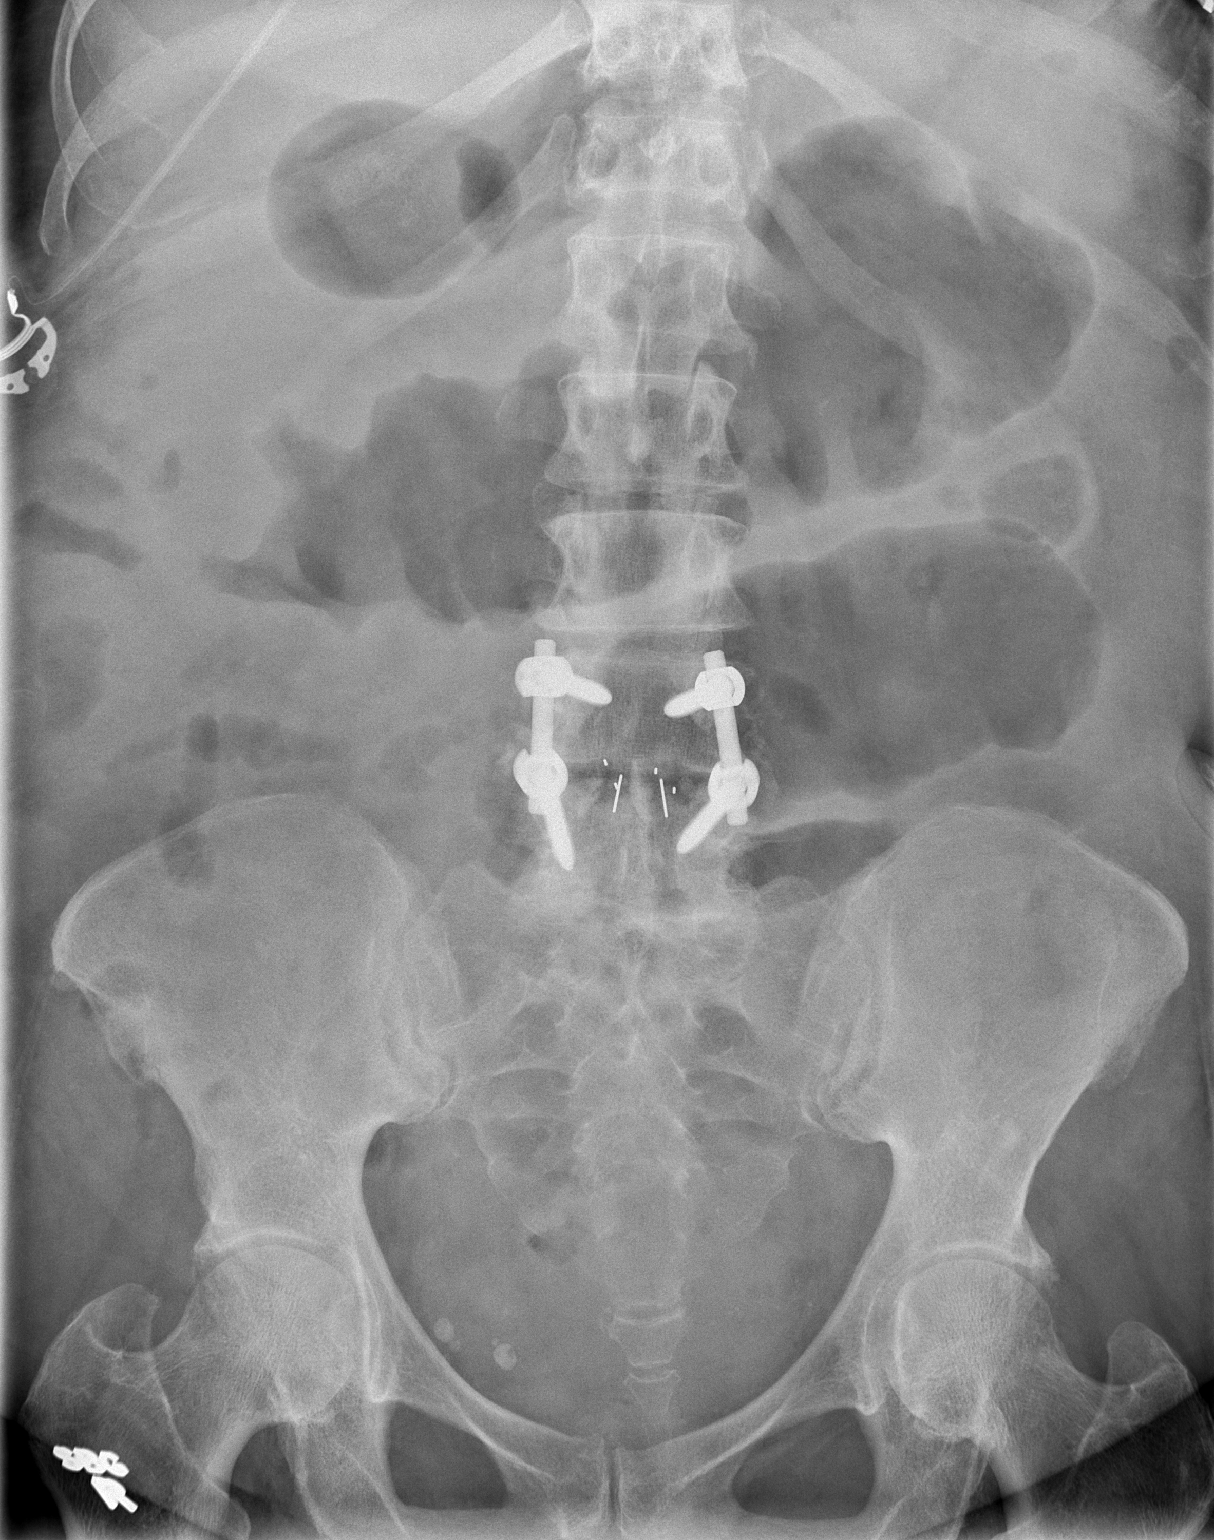

[2 of 2 positions shown; findings below may reference images not displayed]

FINDINGS: Mild gaseous gastric distention is identified. L4-L5 fusion hardware
noted. On the upright view, gas is noted within probable loops of
colon with wall thickness at upper limits of normal allowing for
technique.
IMPRESSION: Mildly prominent gas-filled loops of colon with borderline wall
thickening which is nonspecific and may be artifactual due to
underdistention or could represent wall edema in the case of
infectious or inflammation. No significant interval change.

## 2016-08-12 MED ORDER — DOXYCYCLINE HYCLATE 100 MG PO TABS: 100.0000 mg | ORAL_TABLET | Freq: Two times a day (BID) | ORAL | 0 refills | Status: AC

## 2016-08-12 MED ORDER — BENZONATATE 100 MG PO CAPS: 200.0000 mg | ORAL_CAPSULE | Freq: Two times a day (BID) | ORAL | 0 refills | Status: AC | PRN

## 2016-08-12 NOTE — Progress Notes (Signed)
HPI:  ACUTE VISIT  Chief Complaint  Patient presents with  . Sinus Problem    [name] is a 71 y.o.female here today complaining of 2 days of respiratory symptoms.  Productive cough with white sputum. + Fever 101 F today, myalgias, and chills.  + Nasal congestion, rhinorrhea, sore throat, and post nasal drainage.  She has not noted chest pain or dyspnea. Reporting upper chest wheezing at night.  No Hx of recent travel. No sick contact. No known insect bite.  Hx of allergies: Yes. No Hx of asthma.  OTC medications for this problem: Tylenol early today.  Symptoms otherwise stable.  She has Hx of RA and currently she is on [place].   Hx of HTN, she is not checking BP at home. BP mildly elevated today. She is on Amlodipine 2.5 mg and Hyzaar 100-12.5 mg daily.    Review of Systems  Constitutional: Positive for chills, fatigue and fever. Negative for appetite change.  HENT: Positive for congestion, postnasal drip, rhinorrhea, sinus pressure and sore throat. Negative for ear pain, facial swelling, mouth sores, sneezing, trouble swallowing and voice change.   Eyes: Negative for discharge, redness and itching.  Respiratory: Positive for cough and wheezing. Negative for shortness of breath.   Cardiovascular: Negative for chest pain.  Gastrointestinal: Negative for abdominal pain, nausea and vomiting.  Musculoskeletal: Positive for myalgias. Negative for neck pain.  Skin: Negative for rash.  Allergic/Immunologic: Positive for environmental allergies.  Neurological: Negative for syncope, weakness and headaches.  Hematological: Negative for adenopathy. Does not bruise/bleed easily.  Psychiatric/Behavioral: Negative for confusion.      Current Outpatient Prescriptions on File Prior to Visit  Medication Sig Dispense Refill  . acetaminophen (TYLENOL) 500 MG tablet Take 500 mg by mouth 2 (two) times daily as needed (pain).    [name] Kitchen amLODipine (NORVASC) 2.5 MG tablet  Take 1 tablet (2.5 mg total) by mouth daily. 90 tablet 3  . BLACK COHOSH PO Take by mouth.    . cyclobenzaprine (FLEXERIL) 5 MG tablet Take 1 tablet (5 mg total) by mouth 3 (three) times daily as needed for muscle spasms. 30 tablet 1  . fluticasone (FLONASE) 50 MCG/ACT nasal spray Place 1 spray into both nostrils 2 (two) times daily as needed for allergies. 32 g 7  . leflunomide (ARAVA) 20 MG tablet     . losartan-hydrochlorothiazide (HYZAAR) 100-12.5 MG tablet Take 1 tablet by mouth daily. 100 tablet 3  . ONETOUCH VERIO test strip USE ONCE DAILY FOR CHECK GLUCOSE LEVELS. DX E11.9 100 each 4  . pantoprazole (PROTONIX) 40 MG tablet Take 1 tablet (40 mg total) by mouth daily. 100 tablet 3  . traMADol (ULTRAM) 50 MG tablet Take 2 tablets (100 mg total) by mouth every 6 (six) hours as needed for moderate pain. 60 tablet 0   No current facility-administered medications on file prior to visit.      Past Medical History:  Diagnosis Date  . Allergy   . Arthritis    RA (Dr. Ouida Sills)  . Chronic kidney disease    rt kidney removed  . Colon polyps 2008   HYPERPLASTIC  . Costochondritis   . Diabetes mellitus without complication ([place])    type 2  . Gastritis   . GERD (gastroesophageal reflux disease)   . Hx of cardiovascular stress test    Lexiscan Myoview 6/16:  EF 70%, no scar or ischemia; Low Risk  . Hypertension   . Osteoarthritis   .  Pneumonia    Hx: of  . S/P TAH (total abdominal hysterectomy)   . S/P TKR (total knee replacement) 09  . Shortness of breath    Hx: of with exertion  . Thyroid disease   . Tingling    Hx: of in feet  . Total knee replacement status 09/2007   redo   Allergies  Allergen Reactions  . Percocet [Oxycodone-Acetaminophen] Hives, Itching and Other (See Comments)    hallucinations  . Hydrocodone Other (See Comments)    "crazy dreams"  . Aspirin Other (See Comments)    REACTION: GI upset    Social History   Social History  . Marital status: Divorced     Spouse name: N/A  . Number of children: 3  . Years of education: N/A   Occupational History  . Retired Retired   Social History Main Topics  . Smoking status: Current Every Day Smoker    Years: 15.00    Types: Cigarettes  . Smokeless tobacco: Never Used     Comment: 2 cigarettes per day  . Alcohol use No  . Drug use: No  . Sexual activity: Not Asked   Other Topics Concern  . None   Social History Narrative   Single   Never Smoked    Alcohol use- no   Drug use-no   Regular Exercise-yes   Former Smoker- 12/2008    Vitals:   08/12/16 1136  BP: (!) 150/90  Pulse: 90  Resp: 12  Temp: 98.6 F (37 C)   O2 sat at RA 95%  Body mass index is 35.28 kg/m.     Physical Exam  Nursing note and vitals reviewed. Constitutional: She is oriented to person, place, and time. She appears well-developed. She does not appear ill. No distress.  HENT:  Head: Atraumatic.  Right Ear: Tympanic membrane, external ear and ear canal normal.  Left Ear: Tympanic membrane, external ear and ear canal normal.  Nose: Right sinus exhibits no maxillary sinus tenderness and no frontal sinus tenderness. Left sinus exhibits no maxillary sinus tenderness and no frontal sinus tenderness.  Mouth/Throat: Uvula is midline and mucous membranes are normal. Posterior oropharyngeal erythema (mild) present. No oropharyngeal exudate or posterior oropharyngeal edema.  Nasal voice. Hypertrophic turbinates.  Eyes: Conjunctivae are normal.  Neck: No muscular tenderness present.  Cardiovascular: Normal rate and regular rhythm.   Murmur (soft SEM RUSB) heard. Respiratory: Effort normal and breath sounds normal. No stridor. No respiratory distress. She has no wheezes. She has no rales.  Lymphadenopathy:       Head (right side): No submandibular adenopathy present.       Head (left side): No submandibular adenopathy present.    She has no cervical adenopathy.  Neurological: She is alert and oriented to person,  place, and time. She has normal strength. Gait normal.  Skin: Skin is warm. No rash noted. No erythema.  Psychiatric: She has a normal mood and affect. Her speech is normal.  Well groomed, good eye contact.      ASSESSMENT AND PLAN:     Madolynn was seen today for sinus problem.  Diagnoses and all orders for this visit:   Fever, unspecified fever cause  Rapid Flu negative. Adequate oral hydration and continue Tylenol 500 mg qid as needed. Bed rest.  -     POCT Influenza A/B  URI, acute  Symptoms suggests a viral etiology, I explained patient that symptomatic treatment is usually recommended in this case, so I do not think  abx is needed at this time. I did send a Rx for Doxycycline and instructed to start if still having fever in 2-3 days of if not any better. I do not appreciate wheezing or any lung abnormality.   Instructed to monitor for signs of complications, clearly instructed about warning signs. I also explained that cough and nasal congestion can last a few days and sometimes weeks. F/U as needed.   Essential hypertension  Avoid cold meds. Recommend monitoring BP at home. F/U with PCP as scheduled/planned, before if needed.   Other orders -     benzonatate (TESSALON) 100 MG capsule; Take 2 capsules (200 mg total) by mouth 2 (two) times daily as needed for cough. -     doxycycline (VIBRA-TABS) 100 MG tablet; Take 1 tablet (100 mg total) by mouth 2 (two) times daily.       -[name] was advised to return or notify a doctor immediately if symptoms worsen or persist or new concerns arise, she voices understanding.       Gilberto Stanforth G. [name], MD  [facility]. [place] office.

## 2016-08-12 NOTE — Progress Notes (Signed)
Pre visit review using our clinic review tool, if applicable. No additional management support is needed unless otherwise documented below in the visit note. 

## 2016-08-12 NOTE — Patient Instructions (Signed)
A few things to remember from today's visit:   Fever, unspecified fever cause - Plan: POCT Influenza A/B  URI, acute  Essential hypertension viral infections are self-limited and we treat each symptom depending of severity.  Over the counter medications as decongestants and cold medications usually help, they need to be taken with caution if there is a history of high blood pressure or palpitations. Tylenol and/or Ibuprofen also helps with most symptoms (headache, muscle aching, fever,etc) Plenty of fluids. Honey helps with cough. Steam inhalations helps with runny nose, nasal congestion, and may prevent sinus infections. Cough and nasal congestion could last a few days and sometimes weeks. Please follow in not any better in 1-2 weeks or if symptoms get worse.  Monitor blood pressure at home.  Fill antibiotic if still fever in 2-3 days.  Please be sure medication list is accurate. If a new problem present, please set up appointment sooner than planned today.

## 2016-08-16 DIAGNOSIS — Z79899 Other long term (current) drug therapy: Secondary | ICD-10-CM | POA: Diagnosis not present

## 2016-08-16 DIAGNOSIS — M1990 Unspecified osteoarthritis, unspecified site: Secondary | ICD-10-CM | POA: Diagnosis not present

## 2016-08-16 DIAGNOSIS — M0609 Rheumatoid arthritis without rheumatoid factor, multiple sites: Secondary | ICD-10-CM | POA: Diagnosis not present

## 2016-08-16 DIAGNOSIS — M25532 Pain in left wrist: Secondary | ICD-10-CM | POA: Diagnosis not present

## 2016-08-20 ENCOUNTER — Ambulatory Visit (INDEPENDENT_AMBULATORY_CARE_PROVIDER_SITE_OTHER): Payer: Medicare HMO | Admitting: Physician Assistant

## 2016-08-20 ENCOUNTER — Encounter: Payer: Self-pay | Admitting: Physician Assistant

## 2016-08-20 ENCOUNTER — Other Ambulatory Visit: Payer: Medicare HMO

## 2016-08-20 VITALS — BP 124/76 | HR 80 | Ht 66.0 in | Wt 209.0 lb

## 2016-08-20 DIAGNOSIS — R197 Diarrhea, unspecified: Secondary | ICD-10-CM

## 2016-08-20 DIAGNOSIS — R110 Nausea: Secondary | ICD-10-CM

## 2016-08-20 DIAGNOSIS — R1084 Generalized abdominal pain: Secondary | ICD-10-CM | POA: Diagnosis not present

## 2016-08-20 NOTE — Progress Notes (Signed)
Chief Complaint: Diarrhea, generalized abdominal pain, nausea  HPI:  [name] is a 71 year old African American female with past medical history of chronic kidney disease status post removal of her right kidney, colon polyps, costochondritis, gastritis, GERD, as well as others also below, who presents to clinic today with a complaint of diarrhea, generalized abdominal pain and nausea.  Patient previously followed with Dr. Deatra Ina for screening colonoscopies. Last seen 04/18/15 for colonoscopy, revealing for a sessile polyp in the ascending colon and otherwise a normal exam. Repeat was recommended in 5 years.   Per chart review patient was positive for C. difficile 10/21/14.   Today, the patient presents to clinic and tells me that ever since being diagnosed with C. difficile she has had abnormal bowel habits. She notes that over the past year and a half she is unable to eat certain foods that used to be okay. She has learned that red meat, some salads and spicy food all "run right through me". This has been occurring ever since her C. difficile in March of '16, but recently over the past 1-2 weeks the patient has had an increase in symptoms noting that over the past week or so she has had at least 5-10 loose bowel movements per day. She denies watery appearance or blood. These are associated with a generalized abdominal cramping pain. Along with this patient is experiencing some nausea and initially an intermittent fever. She tells me she did see her primary care physician regarding upper respiratory symptoms about 2 weeks ago and was diagnosed with a viral URI. She was told to use doxycycline if she continued with a fever and she did start this medication for a couple of days, but had to discontinue this, as this made her stomach worse. Patient then was started on azithromycin by her pulmonologist earlier this week and has been taking this medication all week long.   Patient denies weight loss, anorexia,  vomiting, dysphagia or symptoms that awaken her at night.  Past Medical History:  Diagnosis Date  . Allergy   . Arthritis    RA (Dr. Ouida Sills)  . Chronic kidney disease    rt kidney removed  . Colon polyps 2008   HYPERPLASTIC  . Costochondritis   . Diabetes mellitus without complication ([place])    type 2  . Gastritis   . GERD (gastroesophageal reflux disease)   . Hx of cardiovascular stress test    Lexiscan Myoview 6/16:  EF 70%, no scar or ischemia; Low Risk  . Hypertension   . Osteoarthritis   . Pneumonia    Hx: of  . S/P TAH (total abdominal hysterectomy)   . S/P TKR (total knee replacement) 09  . Shortness of breath    Hx: of with exertion  . Thyroid disease   . Tingling    Hx: of in feet  . Total knee replacement status 09/2007   redo    Past Surgical History:  Procedure Laterality Date  . ABDOMINAL HYSTERECTOMY  1978  . BACK SURGERY     x 2  . CARPAL TUNNEL RELEASE Left   . COLONOSCOPY W/ BIOPSIES AND POLYPECTOMY     Hx: of  . HNP    . LUMBAR LAMINECTOMY/DECOMPRESSION MICRODISCECTOMY Left 02/12/2013   Procedure: LUMBAR TWO THREE, LUMBAR THREE FOUR, LUMBAR FOUR FIVE  LAMINECTOMY/DECOMPRESSION MICRODISCECTOMY 3 LEVELS;  Surgeon: Charlie Pitter, MD;  Location: [facility];  Service: Neurosurgery;  Laterality: Left;  . NEPHRECTOMY Right 2010   10.rcc cancer  .  TOTAL KNEE ARTHROPLASTY Right     Current Outpatient Prescriptions  Medication Sig Dispense Refill  . acetaminophen (TYLENOL) 500 MG tablet Take 500 mg by mouth 2 (two) times daily as needed (pain).    [name] Kitchen amLODipine (NORVASC) 2.5 MG tablet Take 1 tablet (2.5 mg total) by mouth daily. 90 tablet 3  . azithromycin (ZITHROMAX) 250 MG tablet     . benzonatate (TESSALON) 100 MG capsule Take 2 capsules (200 mg total) by mouth 2 (two) times daily as needed for cough. 45 capsule 0  . BLACK COHOSH PO Take by mouth.    . cyclobenzaprine (FLEXERIL) 5 MG tablet Take 1 tablet (5 mg total) by mouth 3 (three) times daily as  needed for muscle spasms. 30 tablet 1  . fluticasone (FLONASE) 50 MCG/ACT nasal spray Place 1 spray into both nostrils 2 (two) times daily as needed for allergies. 32 g 7  . leflunomide (ARAVA) 20 MG tablet     . losartan-hydrochlorothiazide (HYZAAR) 100-12.5 MG tablet Take 1 tablet by mouth daily. 100 tablet 3  . ONETOUCH VERIO test strip USE ONCE DAILY FOR CHECK GLUCOSE LEVELS. DX E11.9 100 each 4  . pantoprazole (PROTONIX) 40 MG tablet Take 1 tablet (40 mg total) by mouth daily. 100 tablet 3  . traMADol (ULTRAM) 50 MG tablet Take 2 tablets (100 mg total) by mouth every 6 (six) hours as needed for moderate pain. 60 tablet 0   No current facility-administered medications for this visit.     Allergies as of 08/20/2016 - Review Complete 08/20/2016  Allergen Reaction Noted  . Percocet [oxycodone-acetaminophen] Hives, Itching, and Other (See Comments) 11/04/2014  . Hydrocodone Other (See Comments) 12/04/2014  . Aspirin Other (See Comments)     Family History  Problem Relation Age of Onset  . Rheum arthritis Mother   . Stroke Mother   . Prostate cancer Father   . Heart disease Father   . Diabetes Brother     x 2  . Kidney disease Son   . Colon cancer Neg Hx   . Esophageal cancer Neg Hx   . Pancreatic cancer Neg Hx   . Liver disease Neg Hx     Social History   Social History  . Marital status: Divorced    Spouse name: N/A  . Number of children: 3  . Years of education: N/A   Occupational History  . Retired Retired   Social History Main Topics  . Smoking status: Current Every Day Smoker    Years: 15.00    Types: Cigarettes  . Smokeless tobacco: Never Used     Comment: 2 cigarettes per day  . Alcohol use No  . Drug use: No  . Sexual activity: Not on file   Other Topics Concern  . Not on file   Social History Narrative   Single   Never Smoked    Alcohol use- no   Drug use-no   Regular Exercise-yes   Former Smoker- 12/2008    Review of Systems:      Constitutional: Positive for intermittent fever last week No weight loss Cardiovascular: No chest pain Respiratory: No SOB  Gastrointestinal: See HPI and otherwise negative   Physical Exam:  Vital signs: BP 124/76   Pulse 80   Ht 5\' 6"  (1.676 m)   Wt 209 lb (94.8 kg)   BMI 33.73 kg/m    Constitutional:   Pleasant African-American female appears to be in NAD, Well developed, Well nourished, alert and cooperative Respiratory: Respirations  even and unlabored. Lungs clear to auscultation bilaterally.   No wheezes, crackles, or rhonchi.  Cardiovascular: Normal S1, S2. No MRG. Regular rate and rhythm. No peripheral edema, cyanosis or pallor.  Gastrointestinal:  Soft, nondistended, mild generalized abdominal discomfort to palpation No rebound or guarding. Normal bowel sounds. No appreciable masses or hepatomegaly. Rectal:  Not performed.  Psychiatric: Demonstrates good judgement and reason without abnormal affect or behaviors.  RELEVANT LABS AND IMAGING: CBC    Component Value Date/Time   WBC 5.1 06/01/2016 0839   RBC 4.50 06/01/2016 0839   HGB 13.5 06/01/2016 0839   HCT 39.9 06/01/2016 0839   PLT 191.0 06/01/2016 0839   MCV 88.6 06/01/2016 0839   MCH 28.9 11/15/2014 1800   MCHC 33.8 06/01/2016 0839   RDW 15.3 06/01/2016 0839   LYMPHSABS 2.2 06/01/2016 0839   MONOABS 0.5 06/01/2016 0839   EOSABS 0.5 06/01/2016 0839   BASOSABS 0.0 06/01/2016 0839    CMP     Component Value Date/Time   NA 143 06/01/2016 0839   K 3.9 06/01/2016 0839   CL 105 06/01/2016 0839   CO2 29 06/01/2016 0839   GLUCOSE 94 06/01/2016 0839   BUN 17 06/01/2016 0839   CREATININE 1.02 06/01/2016 0839   CALCIUM 9.9 06/01/2016 0839   PROT 7.0 06/01/2016 0839   ALBUMIN 3.8 06/01/2016 0839   AST 25 06/01/2016 0839   ALT 23 06/01/2016 0839   ALKPHOS 67 06/01/2016 0839   BILITOT 0.6 06/01/2016 0839   GFRNONAA 44 (L) 11/15/2014 1800   GFRAA 51 (L) 11/15/2014 1800    Assessment: 1. Diarrhea: Patient has  intermittent diarrhea over the past year and a half ever since being hospitalized and diagnosed with C-diff, over the past week this has increased in frequency, patient has been on doxycycline and azithromycin over the past 2 weeks for an upper respiratory infection; consider infectious cause versus post infectious IBS versus relation to abx vs other 2. Generalized abdominal pain: With above 3. Reflux: Controlled on Pantoprazole 40 mg daily  Plan: 1. Ordered stool studies to include a GI pathogen panel, C. difficile and O&P 2. Recommend the patient switch probiotics to Align for the next 1-2 months 3. If stool studies are negative would recommend patient start over-the-counter Imodium per package instructions 4. We discussed the possibility of postinfectious IBS as patient has had some diarrhea ever since C. difficile and hospitalization in March of 2016, mostly related to diet 5. Continue Pantoprazole 40 mg daily for reflux 5. Patient to follow in clinic in 3-4 weeks with myself or Dr. Ardis Hughs as he is supervising physician today  Ellouise Newer, PA-C [facility] 08/20/2016, 1:53 PM  Cc: Dorena Cookey, MD

## 2016-08-20 NOTE — Patient Instructions (Signed)
Your physician has requested that you go to the basement for lab work before leaving today.  Please purchase the following medications over the counter and take as directed: Align probiotic once daily.

## 2016-08-23 ENCOUNTER — Ambulatory Visit: Payer: Medicare HMO | Admitting: Gastroenterology

## 2016-08-23 NOTE — Progress Notes (Signed)
I agree with the above note, plan 

## 2016-08-24 ENCOUNTER — Other Ambulatory Visit: Payer: Medicare HMO

## 2016-08-24 DIAGNOSIS — R110 Nausea: Secondary | ICD-10-CM

## 2016-08-24 DIAGNOSIS — R197 Diarrhea, unspecified: Secondary | ICD-10-CM | POA: Diagnosis not present

## 2016-08-24 DIAGNOSIS — R1084 Generalized abdominal pain: Secondary | ICD-10-CM

## 2016-08-25 LAB — GASTROINTESTINAL PATHOGEN PANEL PCR
C. difficile Tox A/B, PCR: NOT DETECTED
Campylobacter, PCR: NOT DETECTED
Cryptosporidium, PCR: NOT DETECTED
E coli (ETEC) LT/ST PCR: NOT DETECTED
E coli (STEC) stx1/stx2, PCR: NOT DETECTED
E coli 0157, PCR: NOT DETECTED
Giardia lamblia, PCR: NOT DETECTED
Norovirus, PCR: NOT DETECTED
Rotavirus A, PCR: NOT DETECTED
Salmonella, PCR: NOT DETECTED
Shigella, PCR: NOT DETECTED

## 2016-08-25 LAB — OVA AND PARASITE EXAMINATION: OP: NONE SEEN

## 2016-08-25 LAB — CLOSTRIDIUM DIFFICILE BY PCR: Toxigenic C. Difficile by PCR: NOT DETECTED

## 2016-09-03 DIAGNOSIS — M1990 Unspecified osteoarthritis, unspecified site: Secondary | ICD-10-CM | POA: Diagnosis not present

## 2016-09-03 DIAGNOSIS — M79643 Pain in unspecified hand: Secondary | ICD-10-CM | POA: Diagnosis not present

## 2016-09-03 DIAGNOSIS — Z79899 Other long term (current) drug therapy: Secondary | ICD-10-CM | POA: Diagnosis not present

## 2016-09-03 DIAGNOSIS — M0609 Rheumatoid arthritis without rheumatoid factor, multiple sites: Secondary | ICD-10-CM | POA: Diagnosis not present

## 2016-09-24 ENCOUNTER — Ambulatory Visit (INDEPENDENT_AMBULATORY_CARE_PROVIDER_SITE_OTHER): Payer: Medicare HMO | Admitting: Physician Assistant

## 2016-09-24 ENCOUNTER — Encounter: Payer: Self-pay | Admitting: Physician Assistant

## 2016-09-24 VITALS — BP 120/74 | HR 94 | Ht 66.0 in | Wt 211.0 lb

## 2016-09-24 DIAGNOSIS — R1084 Generalized abdominal pain: Secondary | ICD-10-CM

## 2016-09-24 DIAGNOSIS — R197 Diarrhea, unspecified: Secondary | ICD-10-CM | POA: Diagnosis not present

## 2016-09-24 DIAGNOSIS — R110 Nausea: Secondary | ICD-10-CM | POA: Diagnosis not present

## 2016-09-24 NOTE — Progress Notes (Signed)
Chief Complaint: Follow-up of diarrhea, generalized abdominal pain and nausea  HPI:   [name] is a 71 year old African-American female with a past medical history of chronic disease s/p right nephrectomy, colon polyps, costochondritis, gastritis, GERD, as well as others listed below, who returns clinic today for follow-up of her diarrhea, generalized abdominal pain and nausea. Recall patient's last colonoscopy was 04/18/15 by Dr. Deatra Ina and revealing for sessile polyp in the ascending colon and was otherwise normal. Repeat was recommended in 5 years.   Patient was last seen in clinic on 08/20/16 and that time was noted that she was positive for C. difficile on 10/21/14. The patient described that ever since being diagnosed with C. Difficile she had had abnormal bowel movements. She had learned certain foods that "just would run right through me" but over the past 1-2 weeks she had had an increase in symptoms explaining that she had at least 5 -10 loose bowel movements per day. These were associated with generalized abdominal cramping pain and some nausea. At that time patient was tested for C. difficile as well as GI pathogen panel and O&P. She was told to start align. She was assigned to Dr. Ardis Hughs at that time. Stool studies returned negative and patient was told to initiate Imodium per package instructions.    Today, the patient tells me that her symptoms have gotten a lot better. She no longer has abdominal pain or nausea. She does continue with some diarrhea but is no longer having 5-10 bowel movements per day, instead she has a loose bowel movement 10-15 minutes after eating. She has noticed that certain foods are "a lot worse" than others. Patient reminds me this all started after C. difficile infection and she feels like she has never been the same since. Overall the patient is feeling much better. She has tried Imodium on occasion which does help as well.   Patient denies fever, chills, blood in  her stool, melena, weight loss or fatigue.  Past Medical History:  Diagnosis Date  . Allergy   . Arthritis    RA (Dr. Ouida Sills)  . Chronic kidney disease    rt kidney removed  . Colon polyps 2008   HYPERPLASTIC  . Costochondritis   . Diabetes mellitus without complication ([place])    type 2  . Gastritis   . GERD (gastroesophageal reflux disease)   . Hx of cardiovascular stress test    Lexiscan Myoview 6/16:  EF 70%, no scar or ischemia; Low Risk  . Hypertension   . Osteoarthritis   . Pneumonia    Hx: of  . S/P TAH (total abdominal hysterectomy)   . S/P TKR (total knee replacement) 09  . Shortness of breath    Hx: of with exertion  . Thyroid disease   . Tingling    Hx: of in feet  . Total knee replacement status 09/2007   redo    Past Surgical History:  Procedure Laterality Date  . ABDOMINAL HYSTERECTOMY  1978  . BACK SURGERY     x 2  . CARPAL TUNNEL RELEASE Left   . COLONOSCOPY W/ BIOPSIES AND POLYPECTOMY     Hx: of  . HNP    . LUMBAR LAMINECTOMY/DECOMPRESSION MICRODISCECTOMY Left 02/12/2013   Procedure: LUMBAR TWO THREE, LUMBAR THREE FOUR, LUMBAR FOUR FIVE  LAMINECTOMY/DECOMPRESSION MICRODISCECTOMY 3 LEVELS;  Surgeon: Charlie Pitter, MD;  Location: [facility];  Service: Neurosurgery;  Laterality: Left;  . NEPHRECTOMY Right 2010   10.rcc cancer  .  TOTAL KNEE ARTHROPLASTY Right     Current Outpatient Prescriptions  Medication Sig Dispense Refill  . acetaminophen (TYLENOL) 500 MG tablet Take 500 mg by mouth 2 (two) times daily as needed (pain).    [name] Kitchen amLODipine (NORVASC) 2.5 MG tablet Take 1 tablet (2.5 mg total) by mouth daily. 90 tablet 3  . azithromycin (ZITHROMAX) 250 MG tablet     . BLACK COHOSH PO Take by mouth.    . cyclobenzaprine (FLEXERIL) 5 MG tablet Take 1 tablet (5 mg total) by mouth 3 (three) times daily as needed for muscle spasms. 30 tablet 1  . fluticasone (FLONASE) 50 MCG/ACT nasal spray Place 1 spray into both nostrils 2 (two) times daily as needed  for allergies. 32 g 7  . leflunomide (ARAVA) 20 MG tablet     . losartan-hydrochlorothiazide (HYZAAR) 100-12.5 MG tablet Take 1 tablet by mouth daily. 100 tablet 3  . ONETOUCH VERIO test strip USE ONCE DAILY FOR CHECK GLUCOSE LEVELS. DX E11.9 100 each 4  . pantoprazole (PROTONIX) 40 MG tablet Take 1 tablet (40 mg total) by mouth daily. 100 tablet 3  . traMADol (ULTRAM) 50 MG tablet Take 2 tablets (100 mg total) by mouth every 6 (six) hours as needed for moderate pain. 60 tablet 0   No current facility-administered medications for this visit.     Allergies as of 09/24/2016 - Review Complete 08/20/2016  Allergen Reaction Noted  . Percocet [oxycodone-acetaminophen] Hives, Itching, and Other (See Comments) 11/04/2014  . Hydrocodone Other (See Comments) 12/04/2014  . Aspirin Other (See Comments)     Family History  Problem Relation Age of Onset  . Rheum arthritis Mother   . Stroke Mother   . Prostate cancer Father   . Heart disease Father   . Diabetes Brother     x 2  . Kidney disease Son   . Colon cancer Neg Hx   . Esophageal cancer Neg Hx   . Pancreatic cancer Neg Hx   . Liver disease Neg Hx     Social History   Social History  . Marital status: Divorced    Spouse name: N/A  . Number of children: 3  . Years of education: N/A   Occupational History  . Retired Retired   Social History Main Topics  . Smoking status: Current Every Day Smoker    Years: 15.00    Types: Cigarettes  . Smokeless tobacco: Never Used     Comment: 2 cigarettes per day  . Alcohol use No  . Drug use: No  . Sexual activity: Not on file   Other Topics Concern  . Not on file   Social History Narrative   Single   Never Smoked    Alcohol use- no   Drug use-no   Regular Exercise-yes   Former Smoker- 12/2008    Review of Systems:    Constitutional: No weight loss, fever or chills Cardiovascular: No chest pain Respiratory: No SOB  Gastrointestinal: See HPI and otherwise negative    Physical Exam:  Vital signs: BP 120/74   Pulse 94   Ht 5\' 6"  (1.676 m)   Wt 211 lb (95.7 kg)   BMI 34.06 kg/m    Constitutional:   Pleasant African American female appears to be in NAD, Well developed, Well nourished, alert and cooperative  Respiratory: Respirations even and unlabored. Lungs clear to auscultation bilaterally.   No wheezes, crackles, or rhonchi.  Cardiovascular: Normal S1, S2. No MRG. Regular rate and rhythm. No peripheral  edema, cyanosis or pallor.  Gastrointestinal:  Soft, nondistended, nontender. No rebound or guarding. Normal bowel sounds. No appreciable masses or hepatomegaly. Psychiatric: Demonstrates good judgement and reason without abnormal affect or behaviors.  No recent blood work or imaging.  Assessment: 1. Diarrhea: Improved, now only 15-20 minutes after she eats, some foods are worse than others recent stool studies negative, improvement with Imodium as well; sounds more like postinfectious IBS 2. Abdominal pain: Resolved 3. Nausea: Resolved  Plan: 1. Provided patient with information regarding a low FODMAP diet. We discussed that if she tries to do this, it is for 6 weeks and then she should add back in foods from the no list. She verbalizes understanding 2. Recommend she use Imodium when necessary 3. Continue Align probiotic daily 4. Patient to return to clinic as needed with Dr. Jerilynn Birkenhead, PA-C [facility] 09/24/2016, 1:09 PM  Cc: Dorena Cookey, MD

## 2016-09-24 NOTE — Patient Instructions (Signed)
You have been given a low fodmap diet handout to follow.   You can use 1/2-1 tablet of Imodium once daily as needed for diarrhea.  Continue Align daily.  Follow with with Dr. Ardis Hughs as needed.

## 2016-09-27 NOTE — Progress Notes (Signed)
I agree with the above note, plan 

## 2016-09-28 DIAGNOSIS — N329 Bladder disorder, unspecified: Secondary | ICD-10-CM | POA: Diagnosis not present

## 2016-09-28 DIAGNOSIS — R69 Illness, unspecified: Secondary | ICD-10-CM | POA: Diagnosis not present

## 2016-09-28 DIAGNOSIS — I10 Essential (primary) hypertension: Secondary | ICD-10-CM | POA: Diagnosis not present

## 2016-09-28 DIAGNOSIS — R0600 Dyspnea, unspecified: Secondary | ICD-10-CM | POA: Diagnosis not present

## 2016-09-28 DIAGNOSIS — K567 Ileus, unspecified: Secondary | ICD-10-CM | POA: Diagnosis not present

## 2016-09-28 DIAGNOSIS — M059 Rheumatoid arthritis with rheumatoid factor, unspecified: Secondary | ICD-10-CM | POA: Diagnosis not present

## 2016-09-28 DIAGNOSIS — G4700 Insomnia, unspecified: Secondary | ICD-10-CM | POA: Diagnosis not present

## 2016-09-28 DIAGNOSIS — Z0000 Encounter for general adult medical examination without abnormal findings: Secondary | ICD-10-CM | POA: Diagnosis not present

## 2016-09-28 DIAGNOSIS — E669 Obesity, unspecified: Secondary | ICD-10-CM | POA: Diagnosis not present

## 2016-09-28 DIAGNOSIS — K219 Gastro-esophageal reflux disease without esophagitis: Secondary | ICD-10-CM | POA: Diagnosis not present

## 2016-09-29 DIAGNOSIS — N3281 Overactive bladder: Secondary | ICD-10-CM | POA: Diagnosis not present

## 2016-09-29 DIAGNOSIS — R32 Unspecified urinary incontinence: Secondary | ICD-10-CM | POA: Diagnosis not present

## 2016-10-01 ENCOUNTER — Encounter: Payer: Self-pay | Admitting: Family Medicine

## 2016-10-01 ENCOUNTER — Ambulatory Visit (INDEPENDENT_AMBULATORY_CARE_PROVIDER_SITE_OTHER): Payer: Medicare HMO | Admitting: Family Medicine

## 2016-10-01 ENCOUNTER — Ambulatory Visit (INDEPENDENT_AMBULATORY_CARE_PROVIDER_SITE_OTHER)
Admission: RE | Admit: 2016-10-01 | Discharge: 2016-10-01 | Disposition: A | Discharge status: Home / Self Care | Payer: Medicare HMO | Source: Ambulatory Visit | Attending: Family Medicine | Admitting: Family Medicine

## 2016-10-01 VITALS — BP 120/80 | HR 94 | Temp 97.7°F | Resp 12 | Ht 66.0 in | Wt 216.0 lb

## 2016-10-01 DIAGNOSIS — R0789 Other chest pain: Secondary | ICD-10-CM | POA: Diagnosis not present

## 2016-10-01 DIAGNOSIS — K2100 Gastro-esophageal reflux disease with esophagitis, without bleeding: Secondary | ICD-10-CM

## 2016-10-01 DIAGNOSIS — R0600 Dyspnea, unspecified: Secondary | ICD-10-CM

## 2016-10-01 DIAGNOSIS — Z79899 Other long term (current) drug therapy: Secondary | ICD-10-CM | POA: Diagnosis not present

## 2016-10-01 DIAGNOSIS — R0602 Shortness of breath: Secondary | ICD-10-CM | POA: Diagnosis not present

## 2016-10-01 DIAGNOSIS — N951 Menopausal and female climacteric states: Secondary | ICD-10-CM | POA: Diagnosis not present

## 2016-10-01 DIAGNOSIS — M0609 Rheumatoid arthritis without rheumatoid factor, multiple sites: Secondary | ICD-10-CM | POA: Diagnosis not present

## 2016-10-01 DIAGNOSIS — R05 Cough: Secondary | ICD-10-CM | POA: Diagnosis not present

## 2016-10-01 DIAGNOSIS — M79643 Pain in unspecified hand: Secondary | ICD-10-CM | POA: Diagnosis not present

## 2016-10-01 DIAGNOSIS — E109 Type 1 diabetes mellitus without complications: Secondary | ICD-10-CM | POA: Diagnosis not present

## 2016-10-01 DIAGNOSIS — M1990 Unspecified osteoarthritis, unspecified site: Secondary | ICD-10-CM | POA: Diagnosis not present

## 2016-10-01 LAB — BASIC METABOLIC PANEL
BUN: 19 mg/dL (ref 7–25)
CO2: 27 mmol/L (ref 20–31)
Calcium: 9.7 mg/dL (ref 8.6–10.4)
Chloride: 106 mmol/L (ref 98–110)
Creat: 1.18 mg/dL — ABNORMAL HIGH (ref 0.60–0.93)
Glucose, Bld: 74 mg/dL (ref 65–99)
Potassium: 3.9 mmol/L (ref 3.5–5.3)
Sodium: 141 mmol/L (ref 135–146)

## 2016-10-01 LAB — CBC
HCT: 40.7 % (ref 35.0–45.0)
Hemoglobin: 13.3 g/dL (ref 11.7–15.5)
MCH: 29.6 pg (ref 27.0–33.0)
MCHC: 32.7 g/dL (ref 32.0–36.0)
MCV: 90.6 fL (ref 80.0–100.0)
MPV: 11.4 fL (ref 7.5–12.5)
Platelets: 207 10*3/uL (ref 140–400)
RBC: 4.49 MIL/uL (ref 3.80–5.10)
RDW: 15.1 % — ABNORMAL HIGH (ref 11.0–15.0)
WBC: 6.2 10*3/uL (ref 3.8–10.8)

## 2016-10-01 MED ORDER — RANITIDINE HCL 150 MG PO TABS: 150.0000 mg | ORAL_TABLET | Freq: Two times a day (BID) | ORAL | 2 refills | Status: DC

## 2016-10-01 MED ORDER — PAROXETINE MESYLATE 7.5 MG PO CAPS: 7.5000 mg | ORAL_CAPSULE | Freq: Every day | ORAL | 1 refills | Status: DC

## 2016-10-01 NOTE — Patient Instructions (Signed)
A few things to remember from today's visit:   Chest tightness - Plan: EKG 12-Lead  Dyspnea, unspecified type  Gastroesophageal reflux disease with esophagitis  Hot flashes, menopausal   Please be sure medication list is accurate. If a new problem present, please set up appointment sooner than planned today.

## 2016-10-01 NOTE — Progress Notes (Signed)
HPI:   ACUTE VISIT:  Chief Complaint  Patient presents with  . Shortness of Breath    feeling hot at at all times like hot flash    [name] is a 71 y.o. female, who is here today complaining of worsening hot flashes.  She states that she has had hot flashes for years but have been worse for the past few days. She is taking Prednisone for RA and wonders if this is aggravating hot flashes.  She has not tried OTC medication, in the past she was taking [employer]. It happens day and night, she feels heat coming from neck up to head, then sweating, intermittent, and last a few minutes. This is affecting her sleep. She has not identified exacerbating or alleviating factors.   Lab Results  Component Value Date   TSH 1.97 06/01/2016   She also mentions SOB and chest "tightness" since yesterday, intermittently, happens with minimal exertion and at rest.She has had 2 episodes, each one lasted a few minutes.  She states that it is "not really pain" but "like indigestion" "discomfort", sometimes radiated to left arm and associated with dyspnea.  Last episode was this morning around 2 am, woke up with hot flash,sweating,and felt SOB. Symptoms alleviated with taking a shower. She is not sure about exacerbating or alleviating factors.  Hx of HTN,HLD,DM II, and tobacco use. She denies prior Hx of SOB or chest pain.  Lab Results  Component Value Date   CHOL 262 (H) 06/01/2016   HDL 64.00 06/01/2016   LDLCALC 173 (H) 06/01/2016   LDLDIRECT 171.5 12/06/2011   TRIG 129.0 06/01/2016   CHOLHDL 4 06/01/2016   Lab Results  Component Value Date   HGBA1C 5.8 06/01/2016     She denies associated wheezing. She has had a nonproductive cough, stable. Started after viral URI a few weeks ago. She has not noted fever, chills, or body aches. + Post nasal drainage.  Hx of GERD, she discontinue Protonix 40 mg about a week ago. According to patient, it was recommended to do so by  nurse during health home visit because it may cause "liver damage." She denies having heartburn or abdominal pain. In the past she has undergone esophageal dilations.  Denies abdominal pain, nausea, vomiting, changes in bowel habits, blood in stool or melena.    Review of Systems  Constitutional: Positive for fatigue. Negative for appetite change, fever and unexpected weight change.  HENT: Positive for postnasal drip. Negative for mouth sores, nosebleeds, trouble swallowing and voice change.   Eyes: Negative for redness and visual disturbance.  Respiratory: Positive for cough and shortness of breath. Negative for wheezing.   Cardiovascular: Positive for chest pain. Negative for palpitations and leg swelling.  Gastrointestinal: Negative for abdominal pain, nausea and vomiting.       Negative for changes in bowel habits.  Endocrine: Positive for heat intolerance. Negative for cold intolerance.  Genitourinary: Negative for decreased urine volume and hematuria.  Musculoskeletal: Positive for arthralgias and back pain (chronic). Negative for gait problem and myalgias.  Skin: Negative for color change and rash.  Allergic/Immunologic: Positive for environmental allergies.  Neurological: Negative for syncope, weakness and headaches.  Psychiatric/Behavioral: Positive for sleep disturbance. Negative for confusion. The patient is nervous/anxious.       Current Outpatient Prescriptions on File Prior to Visit  Medication Sig Dispense Refill  . acetaminophen (TYLENOL) 500 MG tablet Take 500 mg by mouth 2 (two) times daily as needed (pain).    [name] Kitchen  amLODipine (NORVASC) 2.5 MG tablet Take 1 tablet (2.5 mg total) by mouth daily. 90 tablet 3  . BLACK COHOSH PO Take by mouth.    . cyclobenzaprine (FLEXERIL) 5 MG tablet Take 1 tablet (5 mg total) by mouth 3 (three) times daily as needed for muscle spasms. 30 tablet 1  . fluticasone (FLONASE) 50 MCG/ACT nasal spray Place 1 spray into both nostrils 2 (two)  times daily as needed for allergies. 32 g 7  . leflunomide (ARAVA) 20 MG tablet     . losartan-hydrochlorothiazide (HYZAAR) 100-12.5 MG tablet Take 1 tablet by mouth daily. 100 tablet 3  . ONETOUCH VERIO test strip USE ONCE DAILY FOR CHECK GLUCOSE LEVELS. DX E11.9 100 each 4  . predniSONE (DELTASONE) 5 MG tablet Take 5 mg by mouth daily.    . traMADol (ULTRAM) 50 MG tablet Take 2 tablets (100 mg total) by mouth every 6 (six) hours as needed for moderate pain. 60 tablet 0  . pantoprazole (PROTONIX) 40 MG tablet Take 1 tablet (40 mg total) by mouth daily. (Patient not taking: Reported on 10/01/2016) 100 tablet 3   No current facility-administered medications on file prior to visit.      Past Medical History:  Diagnosis Date  . Allergy   . Arthritis    RA (Dr. Ouida Sills)  . Chronic kidney disease    rt kidney removed  . Colon polyps 2008   HYPERPLASTIC  . Costochondritis   . Diabetes mellitus without complication ([place])    type 2  . Gastritis   . GERD (gastroesophageal reflux disease)   . Hx of cardiovascular stress test    Lexiscan Myoview 6/16:  EF 70%, no scar or ischemia; Low Risk  . Hypertension   . Osteoarthritis   . Pneumonia    Hx: of  . S/P TAH (total abdominal hysterectomy)   . S/P TKR (total knee replacement) 09  . Shortness of breath    Hx: of with exertion  . Thyroid disease   . Tingling    Hx: of in feet  . Total knee replacement status 09/2007   redo   Allergies  Allergen Reactions  . Percocet [Oxycodone-Acetaminophen] Hives, Itching and Other (See Comments)    hallucinations  . Hydrocodone Other (See Comments)    "crazy dreams"  . Aspirin Other (See Comments)    REACTION: GI upset    Social History   Social History  . Marital status: Divorced    Spouse name: N/A  . Number of children: 3  . Years of education: N/A   Occupational History  . Retired Retired   Social History Main Topics  . Smoking status: Current Every Day Smoker    Years: 15.00     Types: Cigarettes  . Smokeless tobacco: Never Used     Comment: 2 cigarettes per day  . Alcohol use No  . Drug use: No  . Sexual activity: Not Asked   Other Topics Concern  . None   Social History Narrative   Single   Never Smoked    Alcohol use- no   Drug use-no   Regular Exercise-yes   Former Smoker- 12/2008    Vitals:   10/01/16 1447  BP: 120/80  Pulse: 94  Resp: 12  Temp: 97.7 F (36.5 C)  O2 sat at RA 96%. Body mass index is 34.86 kg/m.   Physical Exam  Nursing note and vitals reviewed. Constitutional: She is oriented to person, place, and time. She appears well-developed. No distress.  HENT:  Head: Atraumatic.  Mouth/Throat: Oropharynx is clear and moist and mucous membranes are normal.  Eyes: Conjunctivae and EOM are normal.  Neck: No JVD present. No tracheal deviation present. No thyroid mass and no thyromegaly (palpable) present.  Cardiovascular: Normal rate and regular rhythm.   Murmur (SEM I/VI RUSB) heard. Pulses:      Dorsalis pedis pulses are 2+ on the right side, and 2+ on the left side.  Respiratory: Effort normal and breath sounds normal. No respiratory distress. She exhibits tenderness (Left costochondral joints).  GI: Soft. She exhibits no mass. There is no hepatomegaly. There is no tenderness.  Musculoskeletal: She exhibits no edema or tenderness.  Lymphadenopathy:    She has no cervical adenopathy.  Neurological: She is alert and oriented to person, place, and time. She has normal strength. Coordination and gait normal.  Skin: Skin is warm. No erythema.  Psychiatric: Her mood appears anxious.  Well groomed, good eye contact.      ASSESSMENT AND PLAN:   Jordis was seen today for shortness of breath.  Diagnoses and all orders for this visit:  Lab Results  Component Value Date   CREATININE 1.18 (H) 10/01/2016   BUN 19 10/01/2016   NA 141 10/01/2016   K 3.9 10/01/2016   CL 106 10/01/2016   CO2 27 10/01/2016   Lab Results    Component Value Date   WBC 6.2 10/01/2016   HGB 13.3 10/01/2016   HCT 40.7 10/01/2016   MCV 90.6 10/01/2016   PLT 207 10/01/2016    Chest tightness  Possible causes dicussed: cardiac,musculoskeletal,GI among some. EKG today: Artefact, SR,LAD,normal intervals, unspecific T wave abnormalities lateral leads,slow R progression.Compared with EKG 06/2016 and no significant changes. She had Lexiscan stress test 01/2015: Tomographic images raised the question of a small area of mild decreased activity at the base/mid inferolateral wall. This is fixed. This may be due to artifact as the ventricle is small. Because characteristics + CV risk factors I recommend cardiology evaluation. She was instructed clearly about warning symptoms.   -     EKG 12-Lead -     Ambulatory referral to Cardiology  Dyspnea, unspecified type  Associated with chest discomfort. Same possible causes as discussed above + COPD Strongly recommend smoking cessation. Further recommendations will be given according to labs/imaging results.  -     DG Chest 2 View; Future  -     Basic metabolic panel -     Ambulatory referral to Cardiology  -     CBC (no diff) -     Brain Natriuretic Peptide  Gastroesophageal reflux disease with esophagitis  Could be causing above problems. We discussed some side effects of PPI's, in her case and given her Hx of esophageal strictures benefits may be greater that rist. She will try Zanatac. GERD precautions discussed. F/U in 4 weeks.   -     ranitidine (ZANTAC) 150 MG tablet; Take 1 tablet (150 mg total) by mouth 2 (two) times daily.  Hot flashes, menopausal  Treatment options discussed. She agrees with trying Paroxetine 7.5 mg at bedtime. Some side effects discussed. F/U in 4 weeks.  -     PARoxetine Mesylate 7.5 MG CAPS; Take 7.5 mg by mouth at bedtime.     Return in about 4 weeks (around 10/29/2016).     -Ms.[name] was advised to return or notify a doctor  immediately if symptoms worsen or new concerns arise.       Malka So  [name], [place]. [place] office.

## 2016-10-02 ENCOUNTER — Encounter: Payer: Self-pay | Admitting: Family Medicine

## 2016-10-02 LAB — BRAIN NATRIURETIC PEPTIDE: Brain Natriuretic Peptide: 14 pg/mL (ref <100)

## 2016-10-08 ENCOUNTER — Encounter: Payer: Self-pay | Admitting: Physician Assistant

## 2016-10-11 ENCOUNTER — Ambulatory Visit (INDEPENDENT_AMBULATORY_CARE_PROVIDER_SITE_OTHER): Payer: Medicare HMO | Admitting: Physician Assistant

## 2016-10-11 ENCOUNTER — Encounter: Payer: Self-pay | Admitting: Physician Assistant

## 2016-10-11 VITALS — BP 160/80 | HR 107 | Ht 66.0 in | Wt 215.0 lb

## 2016-10-11 DIAGNOSIS — N183 Chronic kidney disease, stage 3 unspecified: Secondary | ICD-10-CM

## 2016-10-11 DIAGNOSIS — E7849 Other hyperlipidemia: Secondary | ICD-10-CM

## 2016-10-11 DIAGNOSIS — R002 Palpitations: Secondary | ICD-10-CM

## 2016-10-11 DIAGNOSIS — N184 Chronic kidney disease, stage 4 (severe): Secondary | ICD-10-CM | POA: Insufficient documentation

## 2016-10-11 DIAGNOSIS — R0789 Other chest pain: Secondary | ICD-10-CM

## 2016-10-11 DIAGNOSIS — I10 Essential (primary) hypertension: Secondary | ICD-10-CM | POA: Diagnosis not present

## 2016-10-11 DIAGNOSIS — E784 Other hyperlipidemia: Secondary | ICD-10-CM | POA: Diagnosis not present

## 2016-10-11 HISTORY — DX: Chronic kidney disease, stage 3 unspecified: N18.30

## 2016-10-11 NOTE — Patient Instructions (Addendum)
Medication Instructions:  1. Your physician recommends that you continue on your current medications as directed. Please refer to the Current Medication list given to you today.  Labwork: NONE  Testing/Procedures: 1. Your physician has requested that you have an echocardiogram. Echocardiography is a painless test that uses sound waves to create images of your heart. It provides your doctor with information about the size and shape of your heart and how well your heart's chambers and valves are working. This procedure takes approximately one hour. There are no restrictions for this procedure.  2. Your physician has requested that you have a lexiscan myoview. For further information please visit [email or web address]. Please follow instruction sheet, as given.  Follow-Up: AS NEEDED AT THIS TIME; PENDING TEST RESULTS  Any Other Special Instructions Will Be Listed Below (If Applicable). CALL IF YOUR PALPITATIONS CONTINUE SO THAT WE MAY ARRANGE FOR A HEART MONITOR  If you need a refill on your cardiac medications before your next appointment, please call your pharmacy.

## 2016-10-11 NOTE — Progress Notes (Signed)
Cardiology Office Note:    Date:  10/11/2016   ID:  [name], DOB 1945/12/22, MRN [id number]  PCP:  [name], MD  Cardiologist:  Dr. Cleatis Polka >> will need to est with new Cardiologist.   Electrophysiologist:  n/a  Referring MD: [name], [name], MD   Chief Complaint  Patient presents with  . Chest Pain    History of Present Illness:    [name] is a 71 y.o. female with a hx of HTN, DM2, CKD, renal CA s/p R nephrectomy, RA.  She had chest pain during a hospitalization for lumbar spine surgery c/b C diff colitis and AKI in 10/2014.  She had minimally elevated Troponin levels.  An Echo showed normal ejection fraction and mild diastolic dysfunction.  OP myocardial perfusion imaging study was normal.  Last seen in this office in 4/16.    She is referred back by Dr. [name] with primary care for the evaluation of chest pain and shortness of breath.   CXR and BNP last week were both normal.    [name] is here today by herself. She tells me that she has had chest discomfort off-and-on for the past 3 weeks. She notes it with activity as well as at rest. She has noted an absence of chest pain with activity as well. She's had episodic diaphoresis off and on since 2016. Recently she's had more episodes of diaphoresis at night. This wakes her up and she has palpitations at times with this. She notes fatigue. Her chest discomfort feels like indigestion. Antacids have helped. Her symptoms have improved somewhat since her PCP placed her on ranitidine. She does have dyspnea associated with chest pain. She denies nausea. She denies syncope. She had influenza in January. She's continued to have a cough since then. She denies edema.  Prior CV studies:   The following studies were reviewed today:  Nuclear Stress Perfusion Study 6/16 Ejection fraction is 70%. Wall motion is normal. Overall the study is normal. There is no definite scar or ischemia. This is a low risk scan. I have outlined above  that there are mild changes on the tomographic images that are probably due to artifact related to the small ventricle.  Echo 3/16 Mild conc LVH, EF 65-70, no RWMA, Gr 1 DD, mild TR  Past Medical History:  Diagnosis Date  . Allergy   . Arthritis    RA (Dr. Ouida Sills)  . CKD (chronic kidney disease) stage 3, GFR 30-59 ml/min 10/11/2016   s/p R nephrectomy  . Colon polyps 2008   HYPERPLASTIC  . Costochondritis    a. Nuc Stress Test 6/16: EF 70, no scar or ischemia, low risk // b. Echo 3/16: mild conc LVH, EF 65-70, no RWMA, Gr 1 DD, mild TR  . Diabetes mellitus without complication ([place])    type 2  . Gastritis   . GERD (gastroesophageal reflux disease)   . History of pneumonia   . Hx of cardiovascular stress test    Lexiscan Myoview 6/16:  EF 70%, no scar or ischemia; Low Risk  . Hypertension   . Osteoarthritis   . Thyroid disease     Past Surgical History:  Procedure Laterality Date  . ABDOMINAL HYSTERECTOMY  1978  . BACK SURGERY     x 2  . CARPAL TUNNEL RELEASE Left   . COLONOSCOPY W/ BIOPSIES AND POLYPECTOMY     Hx: of  . HNP    . LUMBAR LAMINECTOMY/DECOMPRESSION MICRODISCECTOMY Left 02/12/2013  Procedure: LUMBAR TWO THREE, LUMBAR THREE FOUR, LUMBAR FOUR FIVE  LAMINECTOMY/DECOMPRESSION MICRODISCECTOMY 3 LEVELS;  Surgeon: Charlie Pitter, MD;  Location: [facility];  Service: Neurosurgery;  Laterality: Left;  . NEPHRECTOMY Right 2010   10.rcc cancer  . TOTAL KNEE ARTHROPLASTY Right    Redo    Current Medications: Current Meds  Medication Sig  . acetaminophen (TYLENOL) 500 MG tablet Take 500 mg by mouth 2 (two) times daily as needed (pain).  [name] Kitchen amLODipine (NORVASC) 2.5 MG tablet Take 1 tablet (2.5 mg total) by mouth daily.  [name] Kitchen BLACK COHOSH PO Take by mouth.  . cyclobenzaprine (FLEXERIL) 5 MG tablet Take 1 tablet (5 mg total) by mouth 3 (three) times daily as needed for muscle spasms.  . fluticasone (FLONASE) 50 MCG/ACT nasal spray Place 1 spray into both nostrils 2 (two)  times daily as needed for allergies.  [name] Kitchen leflunomide (ARAVA) 20 MG tablet Take 20 mg by mouth daily.   [name] Kitchen losartan-hydrochlorothiazide (HYZAAR) 100-12.5 MG tablet Take 1 tablet by mouth daily.  . mirabegron ER (MYRBETRIQ) 25 MG TB24 tablet Take 25 mg by mouth daily.  . Multiple Vitamin (MULTIVITAMIN) tablet Take 1 tablet by mouth daily.  [name] VERIO test strip USE ONCE DAILY FOR CHECK GLUCOSE LEVELS. DX E11.9  . pantoprazole (PROTONIX) 40 MG tablet Take 1 tablet (40 mg total) by mouth daily.  [name] Kitchen PARoxetine Mesylate 7.5 MG CAPS Take 7.5 mg by mouth at bedtime.  . predniSONE (DELTASONE) 5 MG tablet Take 5 mg by mouth daily.  . ranitidine (ZANTAC) 150 MG tablet Take 1 tablet (150 mg total) by mouth 2 (two) times daily.     Allergies:   Percocet [oxycodone-acetaminophen]; Hydrocodone; and Aspirin   Social History   Social History  . Marital status: Divorced    Spouse name: N/A  . Number of children: 3  . Years of education: N/A   Occupational History  . Retired Retired   Social History Main Topics  . Smoking status: Current Every Day Smoker    Years: 15.00    Types: Cigarettes  . Smokeless tobacco: Never Used     Comment: 2 cigarettes per day  . Alcohol use No  . Drug use: No  . Sexual activity: Not Asked   Other Topics Concern  . None   Social History Narrative   Single   Never Smoked    Alcohol use- no   Drug use-no   Regular Exercise-yes   Former Smoker- 12/2008     Family History  Problem Relation Age of Onset  . Rheum arthritis Mother   . Stroke Mother   . Prostate cancer Father   . Heart disease Father   . Diabetes Brother     x 2  . Kidney disease Son   . Colon cancer Neg Hx   . Esophageal cancer Neg Hx   . Pancreatic cancer Neg Hx   . Liver disease Neg Hx      ROS:   Please see the history of present illness.    ROS All other systems reviewed and are negative.   EKGs/Labs/Other Test Reviewed:    EKG:  EKG is  ordered today.  The ekg ordered  today demonstrates sinus tachy, HR 101, LAD, anteroseptal Q waves, QTc 443, no significant change since 4/16  Recent Labs: 06/01/2016: ALT 23; TSH 1.97 10/01/2016: Brain Natriuretic Peptide 14.0; BUN 19; Creat 1.18; Hemoglobin 13.3; Platelets 207; Potassium 3.9; Sodium 141   Recent Lipid Panel  Component Value Date/Time   CHOL 262 (H) 06/01/2016 0839   TRIG 129.0 06/01/2016 0839   HDL 64.00 06/01/2016 0839   CHOLHDL 4 06/01/2016 0839   VLDL 25.8 06/01/2016 0839   LDLCALC 173 (H) 06/01/2016 0839   LDLDIRECT 171.5 12/06/2011 0812     Physical Exam:    VS:  BP (!) 160/80   Pulse (!) 107   Ht 5\' 6"  (1.676 m)   Wt 215 lb (97.5 kg)   BMI 34.70 kg/m     Wt Readings from Last 3 Encounters:  10/11/16 215 lb (97.5 kg)  10/01/16 216 lb (98 kg)  09/24/16 211 lb (95.7 kg)     Physical Exam  Constitutional: She is oriented to person, place, and time. She appears well-developed and well-nourished. No distress.  HENT:  Head: Normocephalic and atraumatic.  Eyes: No scleral icterus.  Neck: Normal range of motion. No JVD present.  Cardiovascular: Normal rate, regular rhythm, S1 normal and S2 normal.   Murmur heard.  Medium-pitched systolic murmur is present with a grade of 2/6  at the upper right sternal border Pulmonary/Chest: Breath sounds normal. She has no wheezes. She has no rhonchi. She has no rales.  Abdominal: Soft. There is no tenderness.  Musculoskeletal: She exhibits no edema.  Neurological: She is alert and oriented to person, place, and time.  Skin: Skin is warm and dry.  Psychiatric: She has a normal mood and affect.    ASSESSMENT:    1. Other chest pain   2. Essential hypertension   3. Other hyperlipidemia   4. Palpitations    PLAN:    In order of problems listed above:  1. Other chest pain - [name] presents for evaluation of chest pain.  She hast atypical and typical features.  She has had some improvement with H2RA therapy which raises my suspicion for  acid reflux contributing to her symptoms.  Her ECG is unchanged from 2016.  She had a recent BNP that was normal.  However, she does have several CRFs including ongoing tobacco abuse, DM, HTN, HL.  It has been 2 years since her last stress test.  She also has a murmur and does admit to fatigue as well as shortness of breath.    -  Arrange Pharmacologic Myocardial Perfusion Imaging study (Lexiscan Myoview)  -  Arrange echocardiogram    2. Essential hypertension -  BP is elevated today.  But, her BP is usually 130/70s at home.  She admits to BP increasing in the doctor's office and she is s/w stressed by the driving conditions getting here (snowing/sleeting today).  Continue to monitor.  3. Other hyperlipidemia -  Managed by PCP.  LDL in 10/17 was 173.  With diabetes she would benefit from to mod to high intensity statin Rx.  She will FU with her PCP to discuss.  4. Palpitations -  I suspect this is secondary to hot flashes.  If this continues with Rx of hot flashes, she will call.  I would recommend a 2 week event monitor at that point.   Dispo:  Return if symptoms worsen or fail to improve or testing is abnormal .   Medication Adjustments/Labs and Tests Ordered: Current medicines are reviewed at length with the patient today.  Concerns regarding medicines are outlined above.  Medication changes, Labs and Tests ordered today are outlined in the Patient Instructions noted below. Patient Instructions  Medication Instructions:  1. Your physician recommends that you continue on your current medications  as directed. Please refer to the Current Medication list given to you today.  Labwork: NONE  Testing/Procedures: 1. Your physician has requested that you have an echocardiogram. Echocardiography is a painless test that uses sound waves to create images of your heart. It provides your doctor with information about the size and shape of your heart and how well your heart's chambers and valves are  working. This procedure takes approximately one hour. There are no restrictions for this procedure.  2. Your physician has requested that you have a lexiscan myoview. For further information please visit [email or web address]. Please follow instruction sheet, as given.  Follow-Up: AS NEEDED AT THIS TIME; PENDING TEST RESULTS  Any Other Special Instructions Will Be Listed Below (If Applicable). CALL IF YOUR PALPITATIONS CONTINUE SO THAT WE MAY ARRANGE FOR A HEART MONITOR  If you need a refill on your cardiac medications before your next appointment, please call your pharmacy.  Signed, Richardson Dopp, PA-C  10/11/2016 12:18 PM    [facility] [place], [place], [place]  [address] Phone: [phone]; Fax: [phone]

## 2016-10-14 ENCOUNTER — Encounter (INDEPENDENT_AMBULATORY_CARE_PROVIDER_SITE_OTHER): Payer: Self-pay

## 2016-10-14 ENCOUNTER — Ambulatory Visit (INDEPENDENT_AMBULATORY_CARE_PROVIDER_SITE_OTHER): Payer: Medicare HMO | Admitting: Diagnostic Neuroimaging

## 2016-10-14 DIAGNOSIS — R202 Paresthesia of skin: Secondary | ICD-10-CM | POA: Diagnosis not present

## 2016-10-14 DIAGNOSIS — R200 Anesthesia of skin: Secondary | ICD-10-CM

## 2016-10-14 DIAGNOSIS — Z0289 Encounter for other administrative examinations: Secondary | ICD-10-CM

## 2016-10-15 ENCOUNTER — Telehealth: Payer: Self-pay

## 2016-10-15 ENCOUNTER — Telehealth: Payer: Self-pay | Admitting: Family Medicine

## 2016-10-15 DIAGNOSIS — N951 Menopausal and female climacteric states: Secondary | ICD-10-CM

## 2016-10-15 MED ORDER — PAROXETINE MESYLATE 7.5 MG PO CAPS: 7.5000 mg | ORAL_CAPSULE | Freq: Every day | ORAL | 1 refills | Status: DC

## 2016-10-15 NOTE — Telephone Encounter (Signed)
Rx resent.

## 2016-10-15 NOTE — Telephone Encounter (Signed)
Pt was seen on 10-01-16 and still waiting on medication to be sent to [facility] for hot flashes

## 2016-10-15 NOTE — Telephone Encounter (Signed)
Received PA request from [facility] for Paroxetine Mesylate. PA submitted & is pending. Key: [id number]

## 2016-10-18 NOTE — Telephone Encounter (Signed)
PA approved, form faxed back to pharmacy. 

## 2016-10-20 NOTE — Procedures (Signed)
[facility]  NCS (NERVE CONDUCTION STUDY) WITH EMG (ELECTROMYOGRAPHY) REPORT   STUDY DATE: 10/20/16 PATIENT NAME: [name] DOB: April 02, 1946 MRN: [id number]  ORDERING CLINICIAN: Stevie Kern, MD  TECHNOLOGIST: Oneita Jolly ELECTROMYOGRAPHER: Earlean Polka. Penumalli, MD  CLINICAL INFORMATION: 71 year old female with numbness in bilateral hands and feet, slightly more noticeable in left hand for past 6 months.  FINDINGS:  NERVE CONDUCTION STUDY: Left median motor response has prolonged distal latency, normal amplitude, normal conduction velocity.  Bilateral ulnar motor responses and F wave latencies are normal.  Right peroneal motor response has normal distal latency, decreased amplitude, normal conduction velocity.  Bilateral radial sensory responses have normal peak latencies and decreased amplitudes.  Bilateral ulnar sensory responses could not be obtained.  Bilateral median sensory responses have prolonged peak latencies and decreased amplitudes.  Right sural sensory response has normal peak latency and decreased amplitude.   NEEDLE ELECTROMYOGRAPHY: Needle examination of left upper extremity deltoid, biceps, triceps, flexor carpi radialis, flexor carpi ulnaris, first dorsal interosseous and left cervical paraspinal muscles (C5-6 and C7-T1) is normal except left flexor carpi ulnaris has 1+ positive sharp waves and fibrillation potentials at rest and decreased recruitment of large motor units on exertion.   IMPRESSION:  Abnormal study demonstrating: 1. 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 2. Superimposed left median neuropathy at the wrist consistent with left carpal tunnel syndrome.     INTERPRETING PHYSICIAN:  Penni Bombard, MD Certified in Neurology, Neurophysiology and Neuroimaging  [facility] [address] [place], [place] [address] [phone]   [facility]      Nerve / Sites Rec. Site Peak Lat Ref.  Amp Ref. Segments Distance    ms ms V V  cm  L Radial - Anatomical snuff box (Forearm)     Forearm Wrist 2.8 ?2.9 8 ?15 Forearm - Wrist 10  R Radial - Anatomical snuff box (Forearm)     Forearm Wrist 2.2 ?2.9 12 ?15 Forearm - Wrist 10  R Sural - Ankle (Calf)     Calf Ankle 3.8 ?4.4 3 ?6 Calf - Ankle 14  L Median - Orthodromic (Dig II, Mid palm)     Dig II Wrist 4.0 ?3.4 4 ?10 Dig II - Wrist 13  R Median - Orthodromic (Dig II, Mid palm)     Dig II Wrist 3.4 ?3.4 7 ?10 Dig II - Wrist 13  L Ulnar - Orthodromic, (Dig V, Mid palm)     Dig V Wrist NR ?3.1 NR ?5 Dig V - Wrist 11  R Ulnar - Orthodromic, (Dig V, Mid palm)     Dig V Wrist NR ?3.1 NR ?5 Dig V - Wrist 11     MNC    Nerve / Sites Muscle Latency Ref. Amplitude Ref. Rel Amp Segments Distance Velocity Ref. Area    ms ms mV mV %  cm m/s m/s mVms  L Median - APB     Wrist APB 4.5 ?4.4 6.2 ?4.0 100 Wrist - APB 7   22.2     Upper arm APB 9.1  5.1  81.9 Upper arm - Wrist 22 48 ?49 20.9  L Ulnar - ADM     Wrist ADM 2.9 ?3.3 8.8 ?6.0 100 Wrist - ADM 7   23.0     B.Elbow ADM 6.8  7.3  82.5 B.Elbow - Wrist 19 49 ?49 20.4     A.Elbow ADM 8.8  6.2  85.4 A.Elbow -  B.Elbow 10 49 ?49 18.4         A.Elbow - Wrist      R Ulnar - ADM     Wrist ADM 2.4 ?3.3 10.8 ?6.0 100 Wrist - ADM 7   27.4     B.Elbow ADM 6.1  9.3  85.9 B.Elbow - Wrist 19 51 ?49 25.4     A.Elbow ADM 8.0  9.0  96.6 A.Elbow - B.Elbow 10 53 ?49 24.8         A.Elbow - Wrist      R Peroneal - EDB     Ankle EDB 4.8 ?6.5 2.5 ?2.0 100 Ankle - EDB 9   7.2     Fib head EDB 12.2  1.7  68.7 Fib head - Ankle 28 38 ?44 4.7     Pop fossa EDB 14.9  1.6  95.5 Pop fossa - Fib head 10 38 ?44 4.2         Pop fossa - Ankle         F  Wave    Nerve F Lat Ref.   ms ms  L Ulnar - ADM 29.5 ?32.0  R Ulnar - ADM 28.3 ?32.0     EMG full       EMG Summary Table    Spontaneous MUAP Recruitment  Muscle IA Fib PSW Fasc Other Amp Dur. Poly Pattern  L.  Deltoid Normal None None None _______ Normal Normal Normal Normal  L. Biceps brachii Normal None None None _______ Normal Normal Normal Normal  L. Triceps brachii Normal None None None _______ Normal Normal Normal Normal  L. Flexor carpi radialis Normal None None None _______ Normal Normal Normal Normal  L. Flexor carpi ulnaris Normal None 1+ None _______ Increased Normal Normal Discrete  L. First dorsal interosseous Normal None None None _______ Normal Normal Normal Normal  L. Cervical paraspinals Normal None None None _______ Normal Normal Normal Normal

## 2016-10-25 ENCOUNTER — Telehealth (HOSPITAL_COMMUNITY): Payer: Self-pay | Admitting: *Deleted

## 2016-10-25 NOTE — Telephone Encounter (Signed)
Patient given detailed instructions per Myocardial Perfusion Study Information Sheet for the test on 10/27/16 at 0945. Patient notified to arrive 15 minutes early and that it is imperative to arrive on time for appointment to keep from having the test rescheduled.  If you need to cancel or reschedule your appointment, please call the office within 24 hours of your appointment. Failure to do so may result in a cancellation of your appointment, and a $50 no show fee. Patient verbalized understanding.Kathaleen Dudziak, [place]

## 2016-10-27 ENCOUNTER — Other Ambulatory Visit: Payer: Self-pay

## 2016-10-27 ENCOUNTER — Ambulatory Visit (HOSPITAL_COMMUNITY): Payer: Medicare HMO | Attending: Cardiovascular Disease

## 2016-10-27 ENCOUNTER — Encounter: Payer: Self-pay | Admitting: Physician Assistant

## 2016-10-27 ENCOUNTER — Telehealth: Payer: Self-pay | Admitting: *Deleted

## 2016-10-27 ENCOUNTER — Ambulatory Visit (HOSPITAL_BASED_OUTPATIENT_CLINIC_OR_DEPARTMENT_OTHER): Payer: Medicare HMO

## 2016-10-27 DIAGNOSIS — R9439 Abnormal result of other cardiovascular function study: Secondary | ICD-10-CM | POA: Insufficient documentation

## 2016-10-27 DIAGNOSIS — I129 Hypertensive chronic kidney disease with stage 1 through stage 4 chronic kidney disease, or unspecified chronic kidney disease: Secondary | ICD-10-CM | POA: Insufficient documentation

## 2016-10-27 DIAGNOSIS — R0789 Other chest pain: Secondary | ICD-10-CM | POA: Insufficient documentation

## 2016-10-27 DIAGNOSIS — N189 Chronic kidney disease, unspecified: Secondary | ICD-10-CM | POA: Diagnosis not present

## 2016-10-27 DIAGNOSIS — Z8249 Family history of ischemic heart disease and other diseases of the circulatory system: Secondary | ICD-10-CM | POA: Diagnosis not present

## 2016-10-27 DIAGNOSIS — E1122 Type 2 diabetes mellitus with diabetic chronic kidney disease: Secondary | ICD-10-CM | POA: Insufficient documentation

## 2016-10-27 DIAGNOSIS — I081 Rheumatic disorders of both mitral and tricuspid valves: Secondary | ICD-10-CM | POA: Diagnosis not present

## 2016-10-27 DIAGNOSIS — R002 Palpitations: Secondary | ICD-10-CM | POA: Diagnosis not present

## 2016-10-27 DIAGNOSIS — R0602 Shortness of breath: Secondary | ICD-10-CM | POA: Diagnosis not present

## 2016-10-27 LAB — MYOCARDIAL PERFUSION IMAGING
LV dias vol: 65 mL (ref 46–106)
LV sys vol: 19 mL
Peak HR: 98 {beats}/min
RATE: 0.28
Rest HR: 76 {beats}/min
SDS: 4
SRS: 6
SSS: 10
TID: 0.97

## 2016-10-27 MED ORDER — REGADENOSON 0.4 MG/5ML IV SOLN
0.4000 mg | Freq: Once | INTRAVENOUS | Status: AC
  Administered 2016-10-27: 0.4 mg via INTRAVENOUS

## 2016-10-27 MED ORDER — TECHNETIUM TC 99M TETROFOSMIN IV KIT
30.6000 | PACK | Freq: Once | INTRAVENOUS | Status: AC | PRN
Start: 2016-10-27 — End: 2016-10-27
  Administered 2016-10-27: 30.6 via INTRAVENOUS
  Filled 2016-10-27: qty 31

## 2016-10-27 MED ORDER — PERFLUTREN LIPID MICROSPHERE
1.0000 mL | INTRAVENOUS | Status: AC | PRN
  Administered 2016-10-27: 2 mL via INTRAVENOUS

## 2016-10-27 MED ORDER — TECHNETIUM TC 99M TETROFOSMIN IV KIT
10.9000 | PACK | Freq: Once | INTRAVENOUS | Status: AC | PRN
  Administered 2016-10-27: 10.9 via INTRAVENOUS
  Filled 2016-10-27: qty 11

## 2016-10-27 NOTE — Telephone Encounter (Signed)
Pt notified of echo results by phone with verbal understanding to findings. Results will be faxed to PCP. Pt is agreeable to plan of care to continue current Tx plan. Pt thanked me for my call tonight.

## 2016-10-27 NOTE — Telephone Encounter (Signed)
-----   Message from Liliane Shi, [place] sent at 10/27/2016  5:30 PM EDT ----- Please call the patient. The echocardiogram shows normal heart function (ejection fraction), mildly impaired relaxation (diastolic dysfunction) and no significant valvular problems.   Continue current treatment plan.  Please fax a copy of this study result to her PCP:  [name], MD  Thanks! Richardson Dopp, PA-C    10/27/2016 5:27 PM

## 2016-10-29 ENCOUNTER — Telehealth: Payer: Self-pay | Admitting: *Deleted

## 2016-10-29 ENCOUNTER — Encounter: Payer: Self-pay | Admitting: Physician Assistant

## 2016-10-29 NOTE — Telephone Encounter (Signed)
Lmtcb to go over Myoview results.  

## 2016-10-29 NOTE — Telephone Encounter (Signed)
-----   Message from Liliane Shi, [place] sent at 10/29/2016  1:38 PM EDT ----- Study reviewed with Dr. Dorris Carnes - images do not show significant ischemia. Please call the patient and tell her that her stress test looks ok. I looked at the images with one of our cardiologists.  She did not see any significant findings to suggest a blockage.   Let's have Ms. Cerino follow up in 3 months to see how she is doing.  She can see me. If she has any issues before then, she can follow up sooner. Please fax a copy of this study result to her PCP:  [name], MD  Thanks! Richardson Dopp, PA-C    10/29/2016 1:32 PM

## 2016-10-31 NOTE — Progress Notes (Signed)
HPI:   [name] is a 71 y.o. female, who is here today to follow on recent OV.   She was seen on 10/01/16, at the time she was c/o worsening hot flashes and chest discomfort associated with dyspnea. She was referred to cardiologists. Since her last OV she has seen Ms Kathlen Mody at Dr Victorino Dike office. She had pharmacologic myocardial perfusion imaging and echo. Stress test 10/27/16: moderate defect mid anteroseptal,inferoseptal,and apical septal. ? Artifact. Echo 3/38/18:LVEF  60% to 65%. Wall motion was normal; there were no regional wall motion abnormalities. Doppler parameters are consistent with abnormal left ventricular relaxation (grade 1 diastolic dysfunction).   She also had EMG of upper extremities because 6 months of upper and lower extremities numbness,no weakness reported. She is reporting that test was abnormal, left upper extremity. She has appt with neurologists to follow on this problem.   GERD: She was on Protonix but discontinued because afraid of developing side effects. Last OV Zantac 150 mg bid was  recommended. Medication has helped with "indigestion" like symptoms,she is not longer having chest discomfort. Tolerating medication well. Denies abdominal pain, nausea, vomiting, changes in bowel habits, blood in stool or melena.   Hot flashes: I recommended Paroxetine 7.5 mg at bedtime, she could not afford it. Symptoms are unchnaged.  HTN: Today her BP was mildly elevated.  BP home "good" She is on Amlodipine 2.5 mg daily and Hyzaar 100-12.5 mg daily. Denies severe/frequent headache,chest pain, dyspnea, palpitation, claudication, focal weakness, or edema.    I noted left eye erythema,which started today,no discharge or pain. + Pruritus. Usually she has similar symptoms around this time of the year and sees eye care provider, planning on arranging appt.  Denies visual changes or associated headache.    Review of Systems  Constitutional: Negative  for activity change, appetite change, fatigue, fever and unexpected weight change.  HENT: Negative for mouth sores, nosebleeds and trouble swallowing.   Eyes: Positive for redness and itching. Negative for pain and visual disturbance.  Respiratory: Negative for cough, shortness of breath and wheezing.   Cardiovascular: Negative for chest pain, palpitations and leg swelling.  Gastrointestinal: Negative for abdominal pain, nausea and vomiting.       Negative for changes in bowel habits.  Genitourinary: Negative for decreased urine volume and hematuria.  Musculoskeletal: Negative for gait problem and myalgias.  Skin: Negative for rash.  Neurological: Positive for numbness. Negative for syncope, weakness and headaches.  Psychiatric/Behavioral: Positive for sleep disturbance. Negative for confusion. The patient is nervous/anxious.       Current Outpatient Prescriptions on File Prior to Visit  Medication Sig Dispense Refill  . acetaminophen (TYLENOL) 500 MG tablet Take 500 mg by mouth 2 (two) times daily as needed (pain).    [name] Kitchen amLODipine (NORVASC) 2.5 MG tablet Take 1 tablet (2.5 mg total) by mouth daily. 90 tablet 3  . BLACK COHOSH PO Take by mouth.    . cyclobenzaprine (FLEXERIL) 5 MG tablet Take 1 tablet (5 mg total) by mouth 3 (three) times daily as needed for muscle spasms. 30 tablet 1  . fluticasone (FLONASE) 50 MCG/ACT nasal spray Place 1 spray into both nostrils 2 (two) times daily as needed for allergies. 32 g 7  . leflunomide (ARAVA) 20 MG tablet Take 20 mg by mouth daily.     [name] Kitchen losartan-hydrochlorothiazide (HYZAAR) 100-12.5 MG tablet Take 1 tablet by mouth daily. 100 tablet 3  . mirabegron ER (MYRBETRIQ) 25 MG TB24 tablet Take  25 mg by mouth daily.    . Multiple Vitamin (MULTIVITAMIN) tablet Take 1 tablet by mouth daily.    [name] VERIO test strip USE ONCE DAILY FOR CHECK GLUCOSE LEVELS. DX E11.9 100 each 4  . predniSONE (DELTASONE) 5 MG tablet Take 5 mg by mouth daily.     No  current facility-administered medications on file prior to visit.      Past Medical History:  Diagnosis Date  . Allergy   . Arthritis    RA (Dr. Ouida Sills)  . CKD (chronic kidney disease) stage 3, GFR 30-59 ml/min 10/11/2016   s/p R nephrectomy  . Colon polyps 2008   HYPERPLASTIC  . Costochondritis    a. Nuc Stress Test 6/16: EF 70, no scar or ischemia, low risk // b. Echo 3/16: mild conc LVH, EF 65-70, no RWMA, Gr 1 DD, mild TR  . Diabetes mellitus without complication ([place])    type 2  . Gastritis   . GERD (gastroesophageal reflux disease)   . History of echocardiogram    Echo 3/18:  Moderate LVH, EF 77-82, grade 1 diastolic dysfunction, calcified aortic valve, mild MR, moderate LAE  . History of nuclear stress test    Myoview 3/18: Mod size and intensity fixed septal defect, may be artifact.Opposite mod size and intensity lat defect, which is reversible and could represent ischemia or possibly artifact (SDS 4). LVEF 71% with normal wall motion. Intermediate risk study. >> images reviewed with Dr. Dorris Carnes - no sig ischemia; med rx   . History of pneumonia   . Hx of cardiovascular stress test    Lexiscan Myoview 6/16:  EF 70%, no scar or ischemia; Low Risk  . Hypertension   . Osteoarthritis   . Thyroid disease    Allergies  Allergen Reactions  . Percocet [Oxycodone-Acetaminophen] Hives, Itching and Other (See Comments)    hallucinations  . Hydrocodone Other (See Comments)    "crazy dreams"  . Aspirin Other (See Comments)    REACTION: GI upset    Social History   Social History  . Marital status: Divorced    Spouse name: N/A  . Number of children: 3  . Years of education: N/A   Occupational History  . Retired Retired   Social History Main Topics  . Smoking status: Current Every Day Smoker    Years: 15.00    Types: Cigarettes  . Smokeless tobacco: Never Used     Comment: 2 cigarettes per day  . Alcohol use No  . Drug use: No  . Sexual activity: Not Asked    Other Topics Concern  . None   Social History Narrative   Single   Never Smoked    Alcohol use- no   Drug use-no   Regular Exercise-yes   Former Smoker- 12/2008    Vitals:   11/01/16 1051 11/01/16 1139  BP: (!) 142/80 138/80  Pulse: 92   Resp: 12    Body mass index is 34.58 kg/m.  Physical Exam  Nursing note and vitals reviewed. Constitutional: She is oriented to person, place, and time. She appears well-developed. No distress.  HENT:  Head: Atraumatic.  Mouth/Throat: Oropharynx is clear and moist and mucous membranes are normal.  Eyes: EOM are normal. Pupils are equal, round, and reactive to light. Left conjunctiva is injected.  Epiphora left eye, no photophobia.  Cardiovascular: Normal rate and regular rhythm.   Murmur (SEM I/VI RUSB) heard. Pulses:      Dorsalis pedis pulses are 2+  on the right side, and 2+ on the left side.  Respiratory: Effort normal and breath sounds normal. No respiratory distress.  GI: Soft. She exhibits no mass. There is no hepatomegaly. There is no tenderness.  Musculoskeletal: She exhibits no edema or tenderness.  Lymphadenopathy:    She has no cervical adenopathy.  Neurological: She is alert and oriented to person, place, and time. She has normal strength. Coordination and gait normal.  Skin: Skin is warm. No erythema.  Psychiatric: Her mood appears anxious.  Well groomed, good eye contact.    ASSESSMENT AND PLAN:   Dailee was seen today for follow-up.  Diagnoses and all orders for this visit:  Conjunctivitis, allergic, left  Reporting similar symptoms in prior years. Monitor for new symptoms. Start topical treatment and arrange appt with eye care provider for follow up. Instructed about warning signs.  -     Olopatadine HCl (PAZEO) 0.7 % SOLN; Apply 1 drop to eye daily.  Gastroesophageal reflux disease with esophagitis  Improved. GERD precautions to continue as well as Ranitidine,some side effects discussed.  F/U in 6-12  months.  -     ranitidine (ZANTAC) 150 MG tablet; Take 1 tablet (150 mg total) by mouth 2 (two) times daily.  Hot flashes, menopausal  She still would like to try pharmacologic treatment,so Paroxetine 10 mg sent (which is most likely cheaper).She can try to break tab and take 7.5 mg or she can take 10 mg.She has some underlying anxiety as well,so may also help with this. Some side effects discussed. If not helping with symptoms,we will consider Effexor. F/U in 4 months.  -     PARoxetine (PAXIL) 10 MG tablet; Take 1 tablet (10 mg total) by mouth daily.  Dyspnea, unspecified type  Resolved. Wt loss recommended. Regular physical activity as tolerated.  Essential hypertension  Re-checked and better. Continue monitoring BP at home. No changes in current management. Low salt diet. F/U in 4 months.     [name]. [name], MD  [facility]. [place] office.

## 2016-11-01 ENCOUNTER — Ambulatory Visit (INDEPENDENT_AMBULATORY_CARE_PROVIDER_SITE_OTHER): Payer: Medicare HMO | Admitting: Family Medicine

## 2016-11-01 ENCOUNTER — Encounter: Payer: Self-pay | Admitting: Family Medicine

## 2016-11-01 VITALS — BP 138/80 | HR 92 | Resp 12 | Ht 66.0 in | Wt 214.2 lb

## 2016-11-01 DIAGNOSIS — R0600 Dyspnea, unspecified: Secondary | ICD-10-CM

## 2016-11-01 DIAGNOSIS — K2100 Gastro-esophageal reflux disease with esophagitis, without bleeding: Secondary | ICD-10-CM

## 2016-11-01 DIAGNOSIS — I10 Essential (primary) hypertension: Secondary | ICD-10-CM

## 2016-11-01 DIAGNOSIS — H1012 Acute atopic conjunctivitis, left eye: Secondary | ICD-10-CM | POA: Diagnosis not present

## 2016-11-01 DIAGNOSIS — N951 Menopausal and female climacteric states: Secondary | ICD-10-CM | POA: Diagnosis not present

## 2016-11-01 MED ORDER — PAROXETINE HCL 10 MG PO TABS: 10.0000 mg | ORAL_TABLET | Freq: Every day | ORAL | 1 refills | Status: DC

## 2016-11-01 MED ORDER — RANITIDINE HCL 150 MG PO TABS: 150.0000 mg | ORAL_TABLET | Freq: Two times a day (BID) | ORAL | 2 refills | Status: DC

## 2016-11-01 MED ORDER — OLOPATADINE HCL 0.7 % OP SOLN: 1.0000 [drp] | Freq: Every day | OPHTHALMIC | 2 refills | Status: DC

## 2016-11-01 NOTE — Telephone Encounter (Signed)
Pt notified of Myoview results by phone with verbal understanding. Pt scheduled to see [place] PA 7/9 9:45. Pt aware to call sooner if she needs to be seen sooner. Pt agreeable to plan of care.

## 2016-11-01 NOTE — Patient Instructions (Signed)
A few things to remember from today's visit:   Gastroesophageal reflux disease with esophagitis - Plan: ranitidine (ZANTAC) 150 MG tablet  Dyspnea, unspecified type  Hot flashes, menopausal - Plan: PARoxetine (PAXIL) 10 MG tablet  Essential hypertension  Blood pressure goal for most people is less than 140/90. Some populations (older than 60) the goal is less than 150/90.  Most recent cardiologists' recommendations recommend blood pressure at or less than 130/80.   Elevated blood pressure increases the risk of strokes, heart and kidney disease, and eye problems. Regular physical activity and a healthy diet (DASH diet) usually help. Low salt diet. Take medications as instructed.  Caution with some over the counter medications as cold medications, dietary products (for weight loss), and Ibuprofen or Aleve (frequent use);all these medications could cause elevation of blood pressure.    Please be sure medication list is accurate. If a new problem present, please set up appointment sooner than planned today.

## 2016-11-01 NOTE — Progress Notes (Signed)
Pre visit review using our clinic review tool, if applicable. No additional management support is needed unless otherwise documented below in the visit note. 

## 2016-11-05 DIAGNOSIS — Z79899 Other long term (current) drug therapy: Secondary | ICD-10-CM | POA: Diagnosis not present

## 2016-11-05 DIAGNOSIS — M0609 Rheumatoid arthritis without rheumatoid factor, multiple sites: Secondary | ICD-10-CM | POA: Diagnosis not present

## 2016-11-05 DIAGNOSIS — M1990 Unspecified osteoarthritis, unspecified site: Secondary | ICD-10-CM | POA: Diagnosis not present

## 2016-11-05 DIAGNOSIS — M79643 Pain in unspecified hand: Secondary | ICD-10-CM | POA: Diagnosis not present

## 2016-11-08 DIAGNOSIS — Z008 Encounter for other general examination: Secondary | ICD-10-CM | POA: Diagnosis not present

## 2016-11-16 ENCOUNTER — Ambulatory Visit: Payer: Medicare HMO | Admitting: Diagnostic Neuroimaging

## 2016-11-17 ENCOUNTER — Telehealth: Payer: Self-pay | Admitting: *Deleted

## 2016-11-17 NOTE — Telephone Encounter (Signed)
LMVM for pt or daughter to call back to reschedule NX  appt (that we cancelled due to no power).

## 2016-11-17 NOTE — Telephone Encounter (Signed)
Scheduled new consult for 12-01-16 at 0830.

## 2016-11-29 ENCOUNTER — Other Ambulatory Visit (INDEPENDENT_AMBULATORY_CARE_PROVIDER_SITE_OTHER): Payer: Medicare HMO

## 2016-11-29 DIAGNOSIS — E109 Type 1 diabetes mellitus without complications: Secondary | ICD-10-CM | POA: Diagnosis not present

## 2016-11-29 DIAGNOSIS — E139 Other specified diabetes mellitus without complications: Secondary | ICD-10-CM

## 2016-11-29 LAB — HEMOGLOBIN A1C: Hgb A1c MFr Bld: 5.8 % (ref 4.6–6.5)

## 2016-12-01 ENCOUNTER — Encounter: Payer: Self-pay | Admitting: Diagnostic Neuroimaging

## 2016-12-01 ENCOUNTER — Ambulatory Visit (INDEPENDENT_AMBULATORY_CARE_PROVIDER_SITE_OTHER): Payer: Medicare HMO | Admitting: Diagnostic Neuroimaging

## 2016-12-01 VITALS — BP 142/92 | HR 90 | Ht 66.0 in | Wt 215.4 lb

## 2016-12-01 DIAGNOSIS — G5602 Carpal tunnel syndrome, left upper limb: Secondary | ICD-10-CM

## 2016-12-01 DIAGNOSIS — R200 Anesthesia of skin: Secondary | ICD-10-CM

## 2016-12-01 DIAGNOSIS — G6289 Other specified polyneuropathies: Secondary | ICD-10-CM | POA: Diagnosis not present

## 2016-12-01 DIAGNOSIS — R202 Paresthesia of skin: Secondary | ICD-10-CM

## 2016-12-01 MED ORDER — GABAPENTIN 300 MG PO CAPS: 300.0000 mg | ORAL_CAPSULE | Freq: Two times a day (BID) | ORAL | 6 refills | Status: DC

## 2016-12-01 NOTE — Patient Instructions (Signed)
-   check labs to eval for other causes of neuropathy  - start gabapentin 300mg  at bedtime  - start compounded neuropathy cream   - wear left wrist splint at bedtime

## 2016-12-01 NOTE — Progress Notes (Signed)
[facility]  PATIENT: [name] DOB: 24-Sep-1945  REFERRING CLINICIAN: G Aryal HISTORY FROM: patient  REASON FOR VISIT: new consult   HISTORICAL  CHIEF COMPLAINT:  Chief Complaint  Patient presents with  . Left hand numbness, CTS    rm 7, New Pt, NCS/EMG on 10/14/16    HISTORY OF PRESENT ILLNESS:   71 year old female with history of rheumatoid arthritis, here for evaluation of left hand numbness and pain. Patient reports numbness in bilateral hands and feet, especially left hand for past 6 months. She describes pain and tenderness in her left fifth digit radiating up to her left elbow. Patient denies any recent accidents or traumas. Patient has history of diabetes, hypertension, renal cell carcinoma, rheumatoid arthritis, currently on leflunomide.  Patient presented for EMG nerve conduction study on 10/14/16 which showed diffuse widespread axonal polyneuropathy as well as superimposed left carpal tunnel syndrome. Patient has history of right carpal tunnel syndrome status post surgery with good results.    REVIEW OF SYSTEMS: Full 14 system review of systems performed and negative with exception of: Fatigue excessive sweating frequent urination dizziness stress swelling agitation frequent waking murmur cough shortness of breath temperature intolerance.   ALLERGIES: Allergies  Allergen Reactions  . Percocet [Oxycodone-Acetaminophen] Hives, Itching and Other (See Comments)    hallucinations  . Hydrocodone Other (See Comments)    "crazy dreams"  . Aspirin Other (See Comments)    REACTION: GI upset    HOME MEDICATIONS: Outpatient Medications Prior to Visit  Medication Sig Dispense Refill  . acetaminophen (TYLENOL) 500 MG tablet Take 500 mg by mouth 2 (two) times daily as needed (pain).    [name] Kitchen amLODipine (NORVASC) 2.5 MG tablet Take 1 tablet (2.5 mg total) by mouth daily. 90 tablet 3  . BLACK COHOSH PO Take by mouth.    . cyclobenzaprine (FLEXERIL) 5 MG  tablet Take 1 tablet (5 mg total) by mouth 3 (three) times daily as needed for muscle spasms. 30 tablet 1  . fluticasone (FLONASE) 50 MCG/ACT nasal spray Place 1 spray into both nostrils 2 (two) times daily as needed for allergies. 32 g 7  . leflunomide (ARAVA) 20 MG tablet Take 20 mg by mouth daily.     [name] Kitchen losartan-hydrochlorothiazide (HYZAAR) 100-12.5 MG tablet Take 1 tablet by mouth daily. 100 tablet 3  . mirabegron ER (MYRBETRIQ) 25 MG TB24 tablet Take 25 mg by mouth daily.    . Multiple Vitamin (MULTIVITAMIN) tablet Take 1 tablet by mouth daily.    . Olopatadine HCl (PAZEO) 0.7 % SOLN Apply 1 drop to eye daily. 1 Bottle 2  . ONETOUCH VERIO test strip USE ONCE DAILY FOR CHECK GLUCOSE LEVELS. DX E11.9 100 each 4  . PARoxetine (PAXIL) 10 MG tablet Take 1 tablet (10 mg total) by mouth daily. 30 tablet 1  . predniSONE (DELTASONE) 5 MG tablet Take 5 mg by mouth daily.    . ranitidine (ZANTAC) 150 MG tablet Take 1 tablet (150 mg total) by mouth 2 (two) times daily. 180 tablet 2   No facility-administered medications prior to visit.     PAST MEDICAL HISTORY: Past Medical History:  Diagnosis Date  . Allergy   . Arthritis    RA (Dr. Ouida Sills)  . C. difficile diarrhea    history of  . CKD (chronic kidney disease) stage 3, GFR 30-59 ml/min 10/11/2016   s/p R nephrectomy  . Colon polyps 2008   HYPERPLASTIC  . Costochondritis    a. Nuc Stress Test  6/16: EF 70, no scar or ischemia, low risk // b. Echo 3/16: mild conc LVH, EF 65-70, no RWMA, Gr 1 DD, mild TR  . Diabetes mellitus without complication ([place])    type 2  . Gastritis   . GERD (gastroesophageal reflux disease)   . History of echocardiogram    Echo 3/18:  Moderate LVH, EF 38-10, grade 1 diastolic dysfunction, calcified aortic valve, mild MR, moderate LAE  . History of nuclear stress test    Myoview 3/18: Mod size and intensity fixed septal defect, may be artifact.Opposite mod size and intensity lat defect, which is reversible and  could represent ischemia or possibly artifact (SDS 4). LVEF 71% with normal wall motion. Intermediate risk study. >> images reviewed with Dr. Dorris Carnes - no sig ischemia; med rx   . History of pneumonia   . Hx of cardiovascular stress test    Lexiscan Myoview 6/16:  EF 70%, no scar or ischemia; Low Risk  . Hypertension   . Osteoarthritis   . Thyroid disease     PAST SURGICAL HISTORY: Past Surgical History:  Procedure Laterality Date  . ABDOMINAL HYSTERECTOMY  1978  . BACK SURGERY     x 2  . CARPAL TUNNEL RELEASE Left   . COLONOSCOPY W/ BIOPSIES AND POLYPECTOMY     Hx: of  . HNP    . LUMBAR LAMINECTOMY/DECOMPRESSION MICRODISCECTOMY Left 02/12/2013   Procedure: LUMBAR TWO THREE, LUMBAR THREE FOUR, LUMBAR FOUR FIVE  LAMINECTOMY/DECOMPRESSION MICRODISCECTOMY 3 LEVELS;  Surgeon: Charlie Pitter, MD;  Location: [place] [facility];  Service: Neurosurgery;  Laterality: Left;  . NEPHRECTOMY Right 2010   10.rcc cancer  . TOTAL KNEE ARTHROPLASTY Right    Redo    FAMILY HISTORY: Family History  Problem Relation Age of Onset  . Rheum arthritis Mother   . Stroke Mother   . Prostate cancer Father   . Heart disease Father   . Diabetes Brother   . Kidney disease Son   . Diabetes Brother   . Cancer Brother   . Parkinsonism Brother   . Dementia Brother   . Colon cancer Neg Hx   . Esophageal cancer Neg Hx   . Pancreatic cancer Neg Hx   . Liver disease Neg Hx     SOCIAL HISTORY:  Social History   Social History  . Marital status: Divorced    Spouse name: N/A  . Number of children: 3  . Years of education: 12   Occupational History  . Retired Retired   Social History Main Topics  . Smoking status: Current Every Day Smoker    Years: 15.00    Types: Cigarettes  . Smokeless tobacco: Never Used     Comment: 12/01/16 2 cigarettes per day  . Alcohol use No  . Drug use: No  . Sexual activity: Not on file   Other Topics Concern  . Not on file   Social History Narrative   Single, dgtr  lives with her   Never Smoked    Alcohol use- no   Drug use-no   Regular Exercise-yes   Former Smoker- 12/2008     PHYSICAL EXAM  GENERAL EXAM/CONSTITUTIONAL: Vitals:  Vitals:   12/01/16 0806  BP: (!) 142/92  Pulse: 90  Weight: 215 lb 6.4 oz (97.7 kg)  Height: _0  (1.676 m)     Body mass index is 34.77 kg/m.  Visual Acuity Screening   Right eye Left eye Both eyes  Without correction: 20/40 20/30  With correction:        Patient is in no distress; well developed, nourished and groomed; neck is supple  CARDIOVASCULAR:  Examination of carotid arteries is normal; no carotid bruits  Regular rate and rhythm, no murmurs  Examination of peripheral vascular system by observation and palpation is normal  EYES:  Ophthalmoscopic exam of optic discs and posterior segments is normal; no papilledema or hemorrhages  MUSCULOSKELETAL:  Gait, strength, tone, movements noted in Neurologic exam below  NEUROLOGIC: MENTAL STATUS:  No flowsheet data found.  awake, alert, oriented to person, place and time  recent and remote memory intact  normal attention and concentration  language fluent, comprehension intact, naming intact,   fund of knowledge appropriate  CRANIAL NERVE:   2nd - no papilledema on fundoscopic exam  2nd, 3rd, 4th, 6th - pupils equal and reactive to light, visual fields full to confrontation, extraocular muscles intact, no nystagmus  5th - facial sensation symmetric  7th - facial strength symmetric  8th - hearing intact  9th - palate elevates symmetrically, uvula midline  11th - shoulder shrug symmetric  12th - tongue protrusion midline  MOTOR:   normal bulk and tone, full strength in the BUE, BLE  SENSORY:   normal and symmetric to light touch, temperature, vibration  DECR PP IN BILATERAL HANDS  LEFT HAND SLIGHTLY HYPERSEN TO PP COMPARED TO RIGHT  BORDERLINE PHALEN'S ON LEFT  NEG TINEL'S  COORDINATION:    finger-nose-finger, fine finger movements normal  REFLEXES:   deep tendon reflexes TRACE and symmetric  GAIT/STATION:   narrow based gait; able to walk on toes, heels and tandem; romberg is negative    DIAGNOSTIC DATA (LABS, IMAGING, TESTING) - I reviewed patient records, labs, notes, testing and imaging myself where available.  Lab Results  Component Value Date   WBC 6.2 10/01/2016   HGB 13.3 10/01/2016   HCT 40.7 10/01/2016   MCV 90.6 10/01/2016   PLT 207 10/01/2016      Component Value Date/Time   NA 141 10/01/2016 1622   K 3.9 10/01/2016 1622   CL 106 10/01/2016 1622   CO2 27 10/01/2016 1622   GLUCOSE 74 10/01/2016 1622   BUN 19 10/01/2016 1622   CREATININE 1.18 (H) 10/01/2016 1622   CALCIUM 9.7 10/01/2016 1622   PROT 7.0 06/01/2016 0839   ALBUMIN 3.8 06/01/2016 0839   AST 25 06/01/2016 0839   ALT 23 06/01/2016 0839   ALKPHOS 67 06/01/2016 0839   BILITOT 0.6 06/01/2016 0839   GFRNONAA 44 (L) 11/15/2014 1800   GFRAA 51 (L) 11/15/2014 1800   Lab Results  Component Value Date   CHOL 262 (H) 06/01/2016   HDL 64.00 06/01/2016   LDLCALC 173 (H) 06/01/2016   LDLDIRECT 171.5 12/06/2011   TRIG 129.0 06/01/2016   CHOLHDL 4 06/01/2016   Lab Results  Component Value Date   HGBA1C 5.8 11/29/2016   No results found for: [id number] Lab Results  Component Value Date   TSH 1.97 06/01/2016    10/14/16 EMG/NCS 1. 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 2. Superimposed left median neuropathy at the wrist consistent with left carpal tunnel syndrome.    ASSESSMENT AND PLAN  71 y.o. year old female here with Rheumatoid arthritis, here for evaluation of numbness and tingling in the hands and feet, especially with tenderness and pain in the left hand and left forearm. Left hand and forearm symptoms may be related to combination of arthritis, peripheral neuropathy and left  carpal tunnel syndrome. We'll check additional  lab testing to rule out other causes of neuropathy. May start treatment of pain symptoms with gabapentin and compounded neuropathy cream as well as left wrist splint for carpal tunnel syndrome. Patient would like to hold off on surgical evaluation of left carpal tunnel syndrome at this time.   Dx left hand pain: rheumatoid arthritis + axonal peripheral neuropathy (due to rheumatoid arthritis or DMT or other causes) + left carpal tunnel syndrome  1. Numbness and tingling in left hand   2. Left carpal tunnel syndrome   3. Numbness and tingling in both hands   4. Numbness and tingling of both feet      PLAN: - check labs to eval for other causes of neuropathy - start gabapentin 319m at bedtime - start compounded neuropathy cream  - wear left wrist splint at bedtime  Orders Placed This Encounter  Procedures  . Vitamin B12  . Multiple Myeloma Panel (SPEP&IFE w/QIG)  . TSH   Meds ordered this encounter  Medications  . gabapentin (NEURONTIN) 300 MG capsule    Sig: Take 1 capsule (300 mg total) by mouth 2 (two) times daily.    Dispense:  60 capsule    Refill:  6   Return in about 3 months (around 03/03/2017).    VPenni Bombard MD 51/10/8401 89:79AM Certified in Neurology, Neurophysiology and Neuroimaging  [facility] [address] [place] [phone]

## 2016-12-06 ENCOUNTER — Encounter: Payer: Self-pay | Admitting: Family Medicine

## 2016-12-06 ENCOUNTER — Encounter: Payer: Self-pay | Admitting: Physician Assistant

## 2016-12-06 ENCOUNTER — Ambulatory Visit (INDEPENDENT_AMBULATORY_CARE_PROVIDER_SITE_OTHER): Payer: Medicare HMO | Admitting: Family Medicine

## 2016-12-06 VITALS — BP 155/80 | HR 89 | Resp 12 | Ht 66.0 in | Wt 216.4 lb

## 2016-12-06 DIAGNOSIS — E109 Type 1 diabetes mellitus without complications: Secondary | ICD-10-CM | POA: Diagnosis not present

## 2016-12-06 DIAGNOSIS — E784 Other hyperlipidemia: Secondary | ICD-10-CM | POA: Diagnosis not present

## 2016-12-06 DIAGNOSIS — N183 Chronic kidney disease, stage 3 unspecified: Secondary | ICD-10-CM

## 2016-12-06 DIAGNOSIS — E139 Other specified diabetes mellitus without complications: Secondary | ICD-10-CM

## 2016-12-06 DIAGNOSIS — Z6834 Body mass index (BMI) 34.0-34.9, adult: Secondary | ICD-10-CM | POA: Diagnosis not present

## 2016-12-06 DIAGNOSIS — I10 Essential (primary) hypertension: Secondary | ICD-10-CM | POA: Diagnosis not present

## 2016-12-06 DIAGNOSIS — E669 Obesity, unspecified: Secondary | ICD-10-CM | POA: Diagnosis not present

## 2016-12-06 DIAGNOSIS — E7849 Other hyperlipidemia: Secondary | ICD-10-CM

## 2016-12-06 LAB — MULTIPLE MYELOMA PANEL, SERUM
Albumin SerPl Elph-Mcnc: 3.6 g/dL (ref 2.9–4.4)
Albumin/Glob SerPl: 1 (ref 0.7–1.7)
Alpha 1: 0.2 g/dL (ref 0.0–0.4)
Alpha2 Glob SerPl Elph-Mcnc: 0.8 g/dL (ref 0.4–1.0)
B-Globulin SerPl Elph-Mcnc: 1.3 g/dL (ref 0.7–1.3)
Gamma Glob SerPl Elph-Mcnc: 1.5 g/dL (ref 0.4–1.8)
Globulin, Total: 3.7 g/dL (ref 2.2–3.9)
IgA/Immunoglobulin A, Serum: 347 mg/dL (ref 64–422)
IgG (Immunoglobin G), Serum: 1388 mg/dL (ref 700–1600)
IgM (Immunoglobulin M), Srm: 22 mg/dL — ABNORMAL LOW (ref 26–217)
Total Protein: 7.3 g/dL (ref 6.0–8.5)

## 2016-12-06 LAB — VITAMIN B12: Vitamin B-12: 555 pg/mL (ref 232–1245)

## 2016-12-06 LAB — TSH: TSH: 2.36 u[IU]/mL (ref 0.450–4.500)

## 2016-12-06 MED ORDER — AMLODIPINE BESYLATE 2.5 MG PO TABS: 5.0000 mg | ORAL_TABLET | Freq: Every day | ORAL | 0 refills | Status: DC

## 2016-12-06 NOTE — Patient Instructions (Signed)
A few things to remember from today's visit:   Essential hypertension - Plan: amLODipine (NORVASC) 2.5 MG tablet  Diabetes 1.5, managed as type 2 ([facility])  CKD (chronic kidney disease) stage 3, GFR 30-59 ml/min  Other hyperlipidemia  Increase Amlodipine to 5 mg. Low fat diet.  Pool exercises are ideal.    Please be sure medication list is accurate. If a new problem present, please set up appointment sooner than planned today.

## 2016-12-06 NOTE — Progress Notes (Signed)
Pre visit review using our clinic review tool, if applicable. No additional management support is needed unless otherwise documented below in the visit note. 

## 2016-12-06 NOTE — Progress Notes (Signed)
HPI:   [name] is a 71 y.o. female, who is here today to formally establish care. I have seeing [name] a few times for acute visits the past few months. She was last seen 11/01/2016 to follow on a recent acute visit.   Former PCP: Dr. Sherren Mocha Last preventive routine visit: 2-3 years ago.  Chronic medical problems: Anxiety,back pain,allergies, CKD,HTN among some. She follows with rheumatology for RA and recently she was evaluated by neurology due to bilateral hands and feet numbness..   She lives with daughter. Independent ADL's and IADL's.  She is trying to do better with her diet. In regard to exercises she is doing "stretching"like exercises 5-6 times per week.    Hyperlipidemia:  Currently on non pharmacologic treatment. Following a low fat diet: [employer]ace[employer] food 2 times per week.   Lab Results  Component Value Date   CHOL 262 (H) 06/01/2016   HDL 64.00 06/01/2016   LDLCALC 173 (H) 06/01/2016   LDLDIRECT 171.5 12/06/2011   TRIG 129.0 06/01/2016   CHOLHDL 4 06/01/2016    Hypertension:    Currently on Amlodipine 2.5 mg and Hyzaar 100-12.5 mg.  BP at home "good" + Stress due to son's illness.  She is taking medications as instructed, no side effects reported.  She has not noted unusual headache, visual changes, exertional chest pain, dyspnea,  focal weakness, or edema.   Lab Results  Component Value Date   CREATININE 1.18 (H) 10/01/2016   BUN 19 10/01/2016   NA 141 10/01/2016   K 3.9 10/01/2016   CL 106 10/01/2016   CO2 27 10/01/2016    Diabetes Mellitus II:   Dx in 2015. Currently on non pharmacologic treatment.  Checking BS's : 80's-121. She is not longer on Prednisone.  Hypoglycemia: Denies  She denies abdominal pain, nausea, vomiting, polydipsia, polyuria, or polyphagia. + Numbness and tingling on upper extremity and feet.   Lab Results  Component Value Date   HGBA1C 5.8 11/29/2016   Lab Results  Component Value Date   MICROALBUR 36.4 (H) 06/01/2016   Hot flashes improved, did not start Paroxetine. Sleeping better,throught the night.  Back pain:  Flexeril 5 mg , she takes very seldom and helps with lower back pain. She received a Rx for Baclofen 2%,Diclofenac 5%,Gabapentin 6%,and Tetracaine 3% compound, which she is planning on starting and recommended for joint and back pain.  GERD: Zantac 150 mg bid.  Denies abdominal pain, nausea, vomiting, changes in bowel habits, blood in stool or melena.  Decreasing smoking.   Review of Systems  Constitutional: Negative for activity change, appetite change, fatigue, fever and unexpected weight change.  HENT: Negative for mouth sores, nosebleeds and trouble swallowing.   Eyes: Negative for redness and visual disturbance.  Respiratory: Negative for cough, shortness of breath and wheezing.   Cardiovascular: Negative for chest pain, palpitations and leg swelling.  Gastrointestinal: Negative for abdominal pain, nausea and vomiting.       Negative for changes in bowel habits.  Endocrine: Negative for cold intolerance, heat intolerance, polydipsia, polyphagia and polyuria.  Genitourinary: Negative for decreased urine volume and hematuria.  Musculoskeletal: Positive for arthralgias and back pain. Negative for gait problem.  Skin: Negative for pallor and rash.  Allergic/Immunologic: Positive for environmental allergies.  Neurological: Positive for numbness. Negative for syncope, weakness and headaches.  Psychiatric/Behavioral: Negative for confusion and sleep disturbance. The patient is nervous/anxious.       Current Outpatient Prescriptions on File Prior to  Visit  Medication Sig Dispense Refill  . acetaminophen (TYLENOL) 500 MG tablet Take 500 mg by mouth 2 (two) times daily as needed (pain).    . cyclobenzaprine (FLEXERIL) 5 MG tablet Take 1 tablet (5 mg total) by mouth 3 (three) times daily as needed for muscle spasms. 30 tablet 1  . fluticasone (FLONASE) 50  MCG/ACT nasal spray Place 1 spray into both nostrils 2 (two) times daily as needed for allergies. 32 g 7  . gabapentin (NEURONTIN) 300 MG capsule Take 1 capsule (300 mg total) by mouth 2 (two) times daily. 60 capsule 6  . leflunomide (ARAVA) 20 MG tablet Take 20 mg by mouth daily.     [name] Kitchen losartan-hydrochlorothiazide (HYZAAR) 100-12.5 MG tablet Take 1 tablet by mouth daily. 100 tablet 3  . mirabegron ER (MYRBETRIQ) 25 MG TB24 tablet Take 25 mg by mouth daily.    . Multiple Vitamin (MULTIVITAMIN) tablet Take 1 tablet by mouth daily.    . NONFORMULARY OR COMPOUNDED ITEM Rx for compounded cream faxed to [place]    . Olopatadine HCl (PAZEO) 0.7 % SOLN Apply 1 drop to eye daily. 1 Bottle 2  . ONETOUCH VERIO test strip USE ONCE DAILY FOR CHECK GLUCOSE LEVELS. DX E11.9 100 each 4  . ranitidine (ZANTAC) 150 MG tablet Take 1 tablet (150 mg total) by mouth 2 (two) times daily. 180 tablet 2   No current facility-administered medications on file prior to visit.      Past Medical History:  Diagnosis Date  . Allergy   . Arthritis    RA (Dr. Ouida Sills)  . C. difficile diarrhea    history of  . CKD (chronic kidney disease) stage 3, GFR 30-59 ml/min 10/11/2016   s/p R nephrectomy  . Colon polyps 2008   HYPERPLASTIC  . Costochondritis    a. Nuc Stress Test 6/16: EF 70, no scar or ischemia, low risk // b. Echo 3/16: mild conc LVH, EF 65-70, no RWMA, Gr 1 DD, mild TR  . Diabetes mellitus without complication ([place])    type 2  . Gastritis   . GERD (gastroesophageal reflux disease)   . History of echocardiogram    Echo 3/18:  Moderate LVH, EF 41-74, grade 1 diastolic dysfunction, calcified aortic valve, mild MR, moderate LAE  . History of nuclear stress test    Myoview 3/18: Mod size and intensity fixed septal defect, may be artifact.Opposite mod size and intensity lat defect, which is reversible and could represent ischemia or possibly artifact (SDS 4). LVEF 71% with normal wall motion. Intermediate  risk study. >> images reviewed with Dr. Dorris Carnes - no sig ischemia; med rx   . History of pneumonia   . Hx of cardiovascular stress test    Lexiscan Myoview 6/16:  EF 70%, no scar or ischemia; Low Risk  . Hypertension   . Osteoarthritis   . Thyroid disease    Allergies  Allergen Reactions  . Percocet [Oxycodone-Acetaminophen] Hives, Itching and Other (See Comments)    hallucinations  . Hydrocodone Other (See Comments)    "crazy dreams"  . Aspirin Other (See Comments)    REACTION: GI upset    Family History  Problem Relation Age of Onset  . Rheum arthritis Mother   . Stroke Mother   . Prostate cancer Father   . Heart disease Father   . Diabetes Brother   . Kidney disease Son   . Diabetes Brother   . Cancer Brother   . Parkinsonism Brother   .  Dementia Brother   . Colon cancer Neg Hx   . Esophageal cancer Neg Hx   . Pancreatic cancer Neg Hx   . Liver disease Neg Hx     Social History   Social History  . Marital status: Divorced    Spouse name: N/A  . Number of children: 3  . Years of education: 12   Occupational History  . Retired Retired   Social History Main Topics  . Smoking status: Current Every Day Smoker    Years: 15.00    Types: Cigarettes  . Smokeless tobacco: Never Used     Comment: 12/01/16 2 cigarettes per day  . Alcohol use No  . Drug use: No  . Sexual activity: Not Asked   Other Topics Concern  . None   Social History Narrative   Single, dgtr lives with her   Never Smoked    Alcohol use- no   Drug use-no   Regular Exercise-yes   Former Smoker- 12/2008    Vitals:   12/06/16 0851 12/06/16 0942  BP: 138/90 (!) 155/80  Pulse: 89   Resp: 12   O2 sat at RA 95% Body mass index is 34.92 kg/m.   Physical Exam  Nursing note and vitals reviewed. Constitutional: She is oriented to person, place, and time. She appears well-developed. No distress.  HENT:  Head: Atraumatic.  Mouth/Throat: Oropharynx is clear and moist and mucous membranes  are normal.  Eyes: Conjunctivae and EOM are normal. Pupils are equal, round, and reactive to light.  Cardiovascular: Normal rate and regular rhythm.   Murmur (SEM I/VI RUSB) heard. Pulses:      Dorsalis pedis pulses are 2+ on the right side, and 2+ on the left side.  Respiratory: Effort normal and breath sounds normal. No respiratory distress.  GI: Soft. She exhibits no mass. There is no hepatomegaly. There is no tenderness.  Musculoskeletal: She exhibits no edema.  Lymphadenopathy:    She has no cervical adenopathy.  Neurological: She is alert and oriented to person, place, and time. She has normal strength. Gait normal.  Skin: Skin is warm. No erythema.  Psychiatric: Her mood appears anxious.  Well groomed, good eye contact.   Diabetic foot exam:  Monofilament normal bilateral. Peripheral pulses present (DP). No calluses    ASSESSMENT AND PLAN:   [name] was seen today for establish care.  Diagnoses and all orders for this visit:  Diabetes 1.5, managed as type 2 ([place])  HgA1C has been at goal. Continue non pharmacologic treatment. Regular exercise and healthy diet with avoidance of added sugar food intake is an important part of treatment and recommended. Annual eye exam, periodic dental and foot care recommended. F/U in 6-12 months.  Essential hypertension  Not well controlled. Amlodipine increased from 2.5 mg to 5 mg.No changes in rest of antihypertensive medications. Possible complications of elevated BP discussed. Annual eye examination. BP check in 6 weeks and f/u in 3-4 months.  -     amLODipine (NORVASC) 2.5 MG tablet; Take 2 tablets (5 mg total) by mouth daily.  CKD (chronic kidney disease) stage 3, GFR 30-59 ml/min  Avoid NSAID's. Better HTN controlled. Adequate hydration and low salt diet. Will re-check labs next OV.  Other hyperlipidemia  She is not interested in trying statin medications. She would like to work on her diet and re-evaluate in a  couple months.  Class 1 obesity with serious comorbidity and body mass index (BMI) of 34.0 to 34.9 in adult, unspecified obesity type  We discussed benefits of wt loss as well as adverse effects of obesity. Consistency with healthy diet and physical activity recommended. Low impact exercise is ideal for her given her Hx of RA and back pain.      Jakerria Kingbird G. [name], MD  [facility]. [place] office.

## 2016-12-08 ENCOUNTER — Telehealth: Payer: Self-pay | Admitting: *Deleted

## 2016-12-08 NOTE — Telephone Encounter (Signed)
Per Dr Leta Baptist, spoke with patient and informed her that her labs are unremarkable. Advised she continue with Dr Gladstone Lighter plan. She stated she is wearing the wrist splint nightly, taking Gabapentin at night which helps her sleep. She stated she is also applying the cream. All these are helping relieve her pain. Reminded her of FU in July and advised she call for any concerns prior to that date. She verbalized understanding, appreciation.

## 2017-01-18 ENCOUNTER — Ambulatory Visit (INDEPENDENT_AMBULATORY_CARE_PROVIDER_SITE_OTHER): Payer: Medicare HMO | Admitting: Family Medicine

## 2017-01-18 ENCOUNTER — Encounter: Payer: Self-pay | Admitting: Family Medicine

## 2017-01-18 VITALS — BP 138/90 | HR 99 | Temp 98.1°F | Resp 12 | Ht 66.0 in | Wt 207.5 lb

## 2017-01-18 DIAGNOSIS — J069 Acute upper respiratory infection, unspecified: Secondary | ICD-10-CM

## 2017-01-18 DIAGNOSIS — J3089 Other allergic rhinitis: Secondary | ICD-10-CM | POA: Diagnosis not present

## 2017-01-18 DIAGNOSIS — M549 Dorsalgia, unspecified: Secondary | ICD-10-CM

## 2017-01-18 NOTE — Patient Instructions (Addendum)
A few things to remember from today's visit:   URI, acute  Non-seasonal allergic rhinitis, unspecified trigger  Right-sided back pain, unspecified back location, unspecified chronicity  viral infections are self-limited and we treat each symptom depending of severity.  Over the counter medications as decongestants and cold medications usually help, they need to be taken with caution if there is a history of high blood pressure or palpitations.SO NOT RECOMMENDED.   Plenty of fluids. Honey helps with cough. Steam inhalations helps with runny nose, nasal congestion, and may prevent sinus infections. Cough and nasal congestion could last a few days and sometimes weeks. Please follow in not any better in  2 weeks or if symptoms get worse.  Back pain: Topical over-the-counter products might help. He can also take Flexeril, half tablet at bedtime. Be careful with falls.   Lab Results  Component Value Date   CHOL 262 (H) 06/01/2016   HDL 64.00 06/01/2016   LDLCALC 173 (H) 06/01/2016   LDLDIRECT 171.5 12/06/2011   TRIG 129.0 06/01/2016   CHOLHDL 4 06/01/2016     Please be sure medication list is accurate. If a new problem present, please set up appointment sooner than planned today.

## 2017-01-18 NOTE — Progress Notes (Signed)
HPI:  ACUTE VISIT  Chief Complaint  Patient presents with  . Chest Congestion    [name] is a 71 y.o.female here today complaining of 5-7 days of respiratory symptoms.  She is also complaining of 5 days of constant dull and intermittent sharp pain: "side pain", points to right lower back. Pain is keeping her from sleep, she wakes up when she turns in bed. She has taken Flexeril for back pain, she has not tried for this problem. She has history of lumbar spinal stenosis.   Back Pain  This is a recurrent problem. The current episode started in the past 7 days. The problem occurs constantly. The problem is unchanged. The pain is present in the lumbar spine. Quality: dull. The pain does not radiate. The pain is at a severity of 7/10. The pain is moderate. The symptoms are aggravated by position, twisting and bending. Associated symptoms include paresthesias (Upper extremities, stable). Pertinent negatives include no abdominal pain, bladder incontinence, bowel incontinence, chest pain, dysuria, fever, headaches, leg pain, pelvic pain, perianal numbness, weakness or weight loss. Risk factors include sedentary lifestyle and obesity. She has tried analgesics for the symptoms. The treatment provided mild relief.  URI   This is a new problem. The current episode started in the past 7 days. The problem has been gradually improving. There has been no fever. Associated symptoms include congestion, coughing and rhinorrhea. Pertinent negatives include no abdominal pain, chest pain, diarrhea, dysuria, ear pain, headaches, nausea, rash, sinus pain, sore throat, swollen glands, vomiting or wheezing. She has tried nothing for the symptoms.    Minimal non productive cough, states that is is mainly nasal congestion and bothered about post nasal drainage.  No Hx of recent travel. No sick contact. No known insect bite.  Hx of allergies: Yes, she is using Flonase nasal spray.  OTC medications  for this problem: None    Review of Systems  Constitutional: Positive for fatigue. Negative for appetite change, chills, fever, unexpected weight change and weight loss.  HENT: Positive for congestion and rhinorrhea. Negative for ear pain, sinus pain, sore throat and trouble swallowing.   Eyes: Negative for redness and visual disturbance.  Respiratory: Positive for cough. Negative for wheezing.   Cardiovascular: Negative for chest pain and leg swelling.  Gastrointestinal: Negative for abdominal pain, bowel incontinence, diarrhea, nausea and vomiting.  Genitourinary: Negative for bladder incontinence, decreased urine volume, dysuria, hematuria and pelvic pain.  Musculoskeletal: Positive for back pain. Negative for gait problem.  Skin: Negative for rash.  Allergic/Immunologic: Positive for environmental allergies.  Neurological: Positive for paresthesias (Upper extremities, stable). Negative for syncope, weakness and headaches.  Hematological: Negative for adenopathy. Does not bruise/bleed easily.  Psychiatric/Behavioral: Positive for sleep disturbance. Negative for confusion. The patient is nervous/anxious.       Current Outpatient Prescriptions on File Prior to Visit  Medication Sig Dispense Refill  . acetaminophen (TYLENOL) 500 MG tablet Take 500 mg by mouth 2 (two) times daily as needed (pain).    [name] Kitchen amLODipine (NORVASC) 2.5 MG tablet Take 2 tablets (5 mg total) by mouth daily. 90 tablet 0  . cyclobenzaprine (FLEXERIL) 5 MG tablet Take 1 tablet (5 mg total) by mouth 3 (three) times daily as needed for muscle spasms. 30 tablet 1  . fluticasone (FLONASE) 50 MCG/ACT nasal spray Place 1 spray into both nostrils 2 (two) times daily as needed for allergies. 32 g 7  . gabapentin (NEURONTIN) 300 MG capsule Take 1 capsule (  300 mg total) by mouth 2 (two) times daily. 60 capsule 6  . leflunomide (ARAVA) 20 MG tablet Take 20 mg by mouth daily.     [name] Kitchen losartan-hydrochlorothiazide (HYZAAR) 100-12.5  MG tablet Take 1 tablet by mouth daily. 100 tablet 3  . mirabegron ER (MYRBETRIQ) 25 MG TB24 tablet Take 25 mg by mouth daily.    . Multiple Vitamin (MULTIVITAMIN) tablet Take 1 tablet by mouth daily.    . NONFORMULARY OR COMPOUNDED ITEM Rx for compounded cream faxed to [facility]    . Olopatadine HCl (PAZEO) 0.7 % SOLN Apply 1 drop to eye daily. 1 Bottle 2  . ONETOUCH VERIO test strip USE ONCE DAILY FOR CHECK GLUCOSE LEVELS. DX E11.9 100 each 4  . ranitidine (ZANTAC) 150 MG tablet Take 1 tablet (150 mg total) by mouth 2 (two) times daily. 180 tablet 2   No current facility-administered medications on file prior to visit.      Past Medical History:  Diagnosis Date  . Allergy   . Arthritis    RA (Dr. Ouida Sills)  . C. difficile diarrhea    history of  . CKD (chronic kidney disease) stage 3, GFR 30-59 ml/min 10/11/2016   s/p R nephrectomy  . Colon polyps 2008   HYPERPLASTIC  . Costochondritis    a. Nuc Stress Test 6/16: EF 70, no scar or ischemia, low risk // b. Echo 3/16: mild conc LVH, EF 65-70, no RWMA, Gr 1 DD, mild TR  . Diabetes mellitus without complication ([place])    type 2  . Gastritis   . GERD (gastroesophageal reflux disease)   . History of echocardiogram    Echo 3/18:  Moderate LVH, EF 30-16, grade 1 diastolic dysfunction, calcified aortic valve, mild MR, moderate LAE  . History of nuclear stress test    Myoview 3/18: Mod size and intensity fixed septal defect, may be artifact.Opposite mod size and intensity lat defect, which is reversible and could represent ischemia or possibly artifact (SDS 4). LVEF 71% with normal wall motion. Intermediate risk study. >> images reviewed with Dr. Dorris Carnes - no sig ischemia; med rx   . History of pneumonia   . Hx of cardiovascular stress test    Lexiscan Myoview 6/16:  EF 70%, no scar or ischemia; Low Risk  . Hypertension   . Osteoarthritis   . Thyroid disease    Allergies  Allergen Reactions  . Percocet  [Oxycodone-Acetaminophen] Hives, Itching and Other (See Comments)    hallucinations  . Hydrocodone Other (See Comments)    "crazy dreams"  . Aspirin Other (See Comments)    REACTION: GI upset    Social History   Social History  . Marital status: Divorced    Spouse name: N/A  . Number of children: 3  . Years of education: 12   Occupational History  . Retired Retired   Social History Main Topics  . Smoking status: Current Every Day Smoker    Years: 15.00    Types: Cigarettes  . Smokeless tobacco: Never Used     Comment: 12/01/16 2 cigarettes per day  . Alcohol use No  . Drug use: No  . Sexual activity: Not Asked   Other Topics Concern  . None   Social History Narrative   Single, dgtr lives with her   Never Smoked    Alcohol use- no   Drug use-no   Regular Exercise-yes   Former Smoker- 12/2008    Vitals:   01/18/17 0940  BP:  138/90  Pulse: 99  Resp: 12  Temp: 98.1 F (36.7 C)  O2 sat at RA 96% Body mass index is 33.49 kg/m.   Physical Exam  Nursing note and vitals reviewed. Constitutional: She is oriented to person, place, and time. She appears well-developed. She does not appear ill. No distress.  HENT:  Head: Atraumatic.  Nose: Right sinus exhibits no maxillary sinus tenderness and no frontal sinus tenderness. Left sinus exhibits no maxillary sinus tenderness and no frontal sinus tenderness.  Enlarged turbinates. Nasal voice. Postnasal drainage.  Eyes: Conjunctivae and EOM are normal. Pupils are equal, round, and reactive to light.  Cardiovascular: Normal rate and regular rhythm.   Murmur (SEM I/VI RUSB) heard. Respiratory: Effort normal and breath sounds normal. No respiratory distress.  GI: Soft. She exhibits no mass. There is no hepatomegaly. There is no tenderness. There is no CVA tenderness.  Musculoskeletal: She exhibits no edema.       Lumbar back: She exhibits no bony tenderness.       Back:  Mild tenderness upon palpation of right lumbar  muscles and under post rib cage. No masses or skin erythema appreciated. Pain elicited with movement: Twisting.  Lymphadenopathy:    She has no cervical adenopathy.  Neurological: She is alert and oriented to person, place, and time. She has normal strength. Gait normal.  SLR negative bilateral.  Skin: Skin is warm. No rash noted. No erythema.  Psychiatric: Her mood appears anxious.  Well groomed, good eye contact.    ASSESSMENT AND PLAN:   Amrita was seen today for chest congestion.  Diagnoses and all orders for this visit:  URI, acute  Symptoms suggests a viral etiology, improving. Symptomatic treatment recommended , so I do not think abx is needed at this time. Instructed to monitor for signs of complications, including new onset of fever among some, clearly instructed about warning signs. I also explained that cough and nasal congestion can last a few days and sometimes weeks. F/U as needed.  Non-seasonal allergic rhinitis, unspecified trigger  Nasal irrigations with saline. Continue Flonase nasal spray daily. OTC antihistaminic might help. Avoid OTC cold medications or decongestants.  Right-sided back pain, unspecified back location, unspecified chronicity  He seems to be musculoskeletal. Recommend applying local heat or OTC topical medications. I don't think imaging is needed at this time. She can take Flexeril 5-10 mg at bedtime. Side effects discussed. Instructed about warning signs.    -Ms. [name] was advised to seek attention immediately if symptoms worsen or to follow if they persist or new concerns arise.     [name]. [name], MD  [facility]. [place] office.

## 2017-01-28 ENCOUNTER — Telehealth: Payer: Self-pay

## 2017-01-28 NOTE — Telephone Encounter (Signed)
Patient is on the list for [employer] 2018 and may be a good candidate for an AWV. Please let me know if/when appt is scheduled.   

## 2017-02-01 ENCOUNTER — Encounter: Payer: Self-pay | Admitting: Physician Assistant

## 2017-02-04 ENCOUNTER — Encounter: Payer: Self-pay | Admitting: Physician Assistant

## 2017-02-04 ENCOUNTER — Ambulatory Visit (INDEPENDENT_AMBULATORY_CARE_PROVIDER_SITE_OTHER): Payer: Medicare HMO | Admitting: Physician Assistant

## 2017-02-04 VITALS — BP 140/74 | HR 84 | Ht 66.0 in | Wt 210.0 lb

## 2017-02-04 DIAGNOSIS — I10 Essential (primary) hypertension: Secondary | ICD-10-CM | POA: Diagnosis not present

## 2017-02-04 DIAGNOSIS — E784 Other hyperlipidemia: Secondary | ICD-10-CM

## 2017-02-04 DIAGNOSIS — R0789 Other chest pain: Secondary | ICD-10-CM

## 2017-02-04 DIAGNOSIS — R5383 Other fatigue: Secondary | ICD-10-CM | POA: Diagnosis not present

## 2017-02-04 DIAGNOSIS — E7849 Other hyperlipidemia: Secondary | ICD-10-CM

## 2017-02-04 NOTE — Progress Notes (Signed)
Cardiology Office Note:    Date:  02/04/2017   ID:  [name], DOB Feb 26, 1946, MRN [id number]  PCP:  [name], [name], MD  Cardiologist:  Dr. Cleatis Blackwell >> will need to est with new Cardiologist ([name]/Rose Uffelman Rose Mody, PA-C) Electrophysiologist:  n/a  Referring MD: [name], [name], MD   Chief Complaint  Patient presents with  . Follow-up    Chest Pain    History of Present Illness:    [name] is a 71 y.o. female with a hx of HTN, DM2, CKD, renal CA s/p R nephrectomy, RA.  She had chest pain during a hospitalization for lumbar spine surgery c/b C diff colitis and AKI in 10/2014.  She had minimally elevated Troponin levels.  An Echo showed normal ejection fraction and mild diastolic dysfunction.  OP myocardial perfusion imaging study was normal.  She was seen back in the office in 3/18 for evaluation of chest pain and shortness of breath. A recent chest x-ray and BNP were both normal.   I obtained and echocardiogram which demonstrated normal LV function and mild diastolic dysfunction and moderate LVH. Nuclear stress testing was also obtained. This demonstrated a lateral defect that was reversible. I reviewed this with [name] who felt that there was no significant ischemia identified.  [name] returns for follow up.  She is here alone. She denies chest discomfort. She notes stable, chronic dyspnea with exertion. She denies any significant changes. She denies orthopnea or PND. She denies LE edema. She denies syncope. She continues to smoke. She does note fatigue. She admits to a history of snoring.  Prior CV studies:   The following studies were reviewed today:  Echo 09/2016 Moderate LVH, EF 60-65, normal wall motion, grade 1 diastolic dysfunction, mild MR, moderate LAE  Myoview 09/2016 Fixed septal defect-possible artifact; reversible lateral defect (ischemia versus artifact), EF 71; intermediate risk Study reviewed with [name] - no significant ischemia identified; medical  therapy continued  Nuclear Stress Perfusion Study 6/16 Ejection fraction is 70%. Wall motion is normal. Overall the study is normal. There is no definite scar or ischemia. This is a low risk scan. I have outlined above that there are mild changes on the tomographic images that are probably due to artifact related to the small ventricle.   Echo 3/16 Mild conc LVH, EF 65-70, no RWMA, Gr 1 DD, mild TR  Past Medical History:  Diagnosis Date  . Allergy   . Arthritis    RA (Dr. Ouida Sills)  . C. difficile diarrhea    history of  . CKD (chronic kidney disease) stage 3, GFR 30-59 ml/min 10/11/2016   s/p R nephrectomy  . Colon polyps 2008   HYPERPLASTIC  . Costochondritis    a. Nuc Stress Test 6/16: EF 70, no scar or ischemia, low risk // b. Echo 3/16: mild conc LVH, EF 65-70, no RWMA, Gr 1 DD, mild TR  . Diabetes mellitus without complication ([place])    type 2  . Gastritis   . GERD (gastroesophageal reflux disease)   . History of echocardiogram    Echo 3/18:  Moderate LVH, EF 29-93, grade 1 diastolic dysfunction, calcified aortic valve, mild MR, moderate LAE  . History of nuclear stress test    Myoview 3/18: Mod size and intensity fixed septal defect, may be artifact.Opposite mod size and intensity lat defect, which is reversible and could represent ischemia or possibly artifact (SDS 4). LVEF 71% with normal wall motion. Intermediate risk study. >>  images reviewed with Dr. Dorris Carnes - no sig ischemia; med rx   . History of pneumonia   . Hx of cardiovascular stress test    Lexiscan Myoview 6/16:  EF 70%, no scar or ischemia; Low Risk  . Hypertension   . Osteoarthritis   . Thyroid disease     Past Surgical History:  Procedure Laterality Date  . ABDOMINAL HYSTERECTOMY  1978  . BACK SURGERY     x 2  . CARPAL TUNNEL RELEASE Left   . COLONOSCOPY W/ BIOPSIES AND POLYPECTOMY     Hx: of  . HNP    . LUMBAR LAMINECTOMY/DECOMPRESSION MICRODISCECTOMY Left 02/12/2013   Procedure: LUMBAR TWO  THREE, LUMBAR THREE FOUR, LUMBAR FOUR FIVE  LAMINECTOMY/DECOMPRESSION MICRODISCECTOMY 3 LEVELS;  Surgeon: Charlie Pitter, MD;  Location: [facility];  Service: Neurosurgery;  Laterality: Left;  . NEPHRECTOMY Right 2010   10.rcc cancer  . TOTAL KNEE ARTHROPLASTY Right    Redo    Current Medications: Current Meds  Medication Sig  . acetaminophen (TYLENOL) 500 MG tablet Take 500 mg by mouth 2 (two) times daily as needed (pain).  [name] Kitchen amLODipine (NORVASC) 2.5 MG tablet Take 2 tablets (5 mg total) by mouth daily.  . cyclobenzaprine (FLEXERIL) 5 MG tablet Take 1 tablet (5 mg total) by mouth 3 (three) times daily as needed for muscle spasms.  . fluticasone (FLONASE) 50 MCG/ACT nasal spray Place 1 spray into both nostrils 2 (two) times daily as needed for allergies.  [name] Kitchen gabapentin (NEURONTIN) 300 MG capsule Take 1 capsule (300 mg total) by mouth 2 (two) times daily.  [name] Kitchen leflunomide (ARAVA) 20 MG tablet Take 20 mg by mouth daily.   [name] Kitchen losartan-hydrochlorothiazide (HYZAAR) 100-12.5 MG tablet Take 1 tablet by mouth daily.  . mirabegron ER (MYRBETRIQ) 25 MG TB24 tablet Take 25 mg by mouth daily.  . Multiple Vitamin (MULTIVITAMIN) tablet Take 1 tablet by mouth daily.  . NONFORMULARY OR COMPOUNDED ITEM Rx for compounded cream faxed to [facility]  . Olopatadine HCl (PAZEO) 0.7 % SOLN Apply 1 drop to eye daily.  [name] VERIO test strip USE ONCE DAILY FOR CHECK GLUCOSE LEVELS. DX E11.9  . ranitidine (ZANTAC) 150 MG tablet Take 1 tablet (150 mg total) by mouth 2 (two) times daily.     Allergies:   Percocet [oxycodone-acetaminophen]; Hydrocodone; and Aspirin   Social History   Social History  . Marital status: Divorced    Spouse name: N/A  . Number of children: 3  . Years of education: 12   Occupational History  . Retired Retired   Social History Main Topics  . Smoking status: Current Every Day Smoker    Years: 15.00    Types: Cigarettes  . Smokeless tobacco: Never Used     Comment: 12/01/16 2  cigarettes per day  . Alcohol use No  . Drug use: No  . Sexual activity: Not Asked   Other Topics Concern  . None   Social History Narrative   Single, dgtr lives with her   Never Smoked    Alcohol use- no   Drug use-no   Regular Exercise-yes   Former Smoker- 12/2008     Family Hx: The patient's family history includes Cancer in her brother; Dementia in her brother; Diabetes in her brother and brother; Heart disease in her father; Kidney disease in her son; Parkinsonism in her brother; Prostate cancer in her father; Rheum arthritis in her mother; Stroke in her mother. There is no history  of Colon cancer, Esophageal cancer, Pancreatic cancer, or Liver disease.  ROS:   Please see the history of present illness.    Review of Systems  Constitution: Positive for chills, malaise/fatigue and weight loss.  Respiratory: Positive for cough.    All other systems reviewed and are negative.   EKGs/Labs/Other Test Reviewed:    EKG:  EKG is  ordered today.  The ekg ordered today demonstrates NSR, HR 84, LAD, septal Q waves, QTc 439 ms, no change from prior tracing  Recent Labs: 06/01/2016: ALT 23 10/01/2016: Brain Natriuretic Peptide 14.0; BUN 19; Creat 1.18; Hemoglobin 13.3; Platelets 207; Potassium 3.9; Sodium 141 12/01/2016: TSH 2.360   Recent Lipid Panel Lab Results  Component Value Date/Time   CHOL 262 (H) 06/01/2016 08:39 AM   TRIG 129.0 06/01/2016 08:39 AM   HDL 64.00 06/01/2016 08:39 AM   CHOLHDL 4 06/01/2016 08:39 AM   LDLCALC 173 (H) 06/01/2016 08:39 AM   LDLDIRECT 171.5 12/06/2011 08:12 AM    Physical Exam:    VS:  BP 140/74   Pulse 84   Ht 5\' 6"  (1.676 m)   Wt 210 lb (95.3 kg)   SpO2 98%   BMI 33.89 kg/m     Wt Readings from Last 3 Encounters:  02/04/17 210 lb (95.3 kg)  01/18/17 207 lb 8 oz (94.1 kg)  12/06/16 216 lb 6 oz (98.1 kg)     Physical Exam  Constitutional: She is oriented to person, place, and time. She appears well-developed and well-nourished. No  distress.  HENT:  Head: Normocephalic and atraumatic.  Neck: No JVD present.  Cardiovascular: Normal rate and regular rhythm.   No murmur heard. Pulmonary/Chest: She has no wheezes. She has no rales.  Abdominal: Soft.  Musculoskeletal: She exhibits no edema.  Neurological: She is alert and oriented to person, place, and time.  Skin: Skin is warm and dry.  Psychiatric: She has a normal mood and affect.    ASSESSMENT:    1. Other chest pain   2. Essential hypertension   3. Other hyperlipidemia   4. Other fatigue    PLAN:    In order of problems listed above:  1. Other chest pain - Resolved. She had a recent nuclear stress test that was called intermediate risk due to lateral defect that was reversible. I reviewed this with [name] who felt that the study demonstrated overall normal perfusion. Since the patient has not had further chest symptoms and her stress test was overall felt to be low risk, she requires no further cardiac workup at this time. She can follow-up with cardiology as needed.  2. Essential hypertension - Borderline control. PCP has recently adjusted medication. She has follow-up in the next several weeks. Continue current management and follow-up with primary care.  3. Other hyperlipidemia - Managed by primary care.  4. Other fatigue - She has labs pending for physical exam. If her labs are unremarkable, she may need to consider sleep test to rule out sleep apnea. She will discuss this with primary care.   Dispo:  Return As needed with [name] or Richardson Dopp, PA-C.   Medication Adjustments/Labs and Tests Ordered: Current medicines are reviewed at length with the patient today.  Concerns regarding medicines are outlined above.  Tests Ordered: No orders of the defined types were placed in this encounter.  Medication Changes: No orders of the defined types were placed in this encounter.   Signed, Richardson Dopp, PA-C  02/04/2017 12:39 PM  [facility] [address], [place], [place]  [address] Phone: [phone]; Fax: [phone]

## 2017-02-04 NOTE — Patient Instructions (Signed)
Medication Instructions:  Your physician recommends that you continue on your current medications as directed. Please refer to the Current Medication list given to you today.   Labwork: NONE ORDERED  Testing/Procedures: NONE ORDERED  Follow-Up: AS NEEDED   Any Other Special Instructions Will Be Listed Below (If Applicable).     If you need a refill on your cardiac medications before your next appointment, please call your pharmacy.

## 2017-02-07 ENCOUNTER — Ambulatory Visit: Payer: Medicare HMO | Admitting: Physician Assistant

## 2017-02-07 NOTE — Addendum Note (Signed)
Addended by: Briant Cedar on: 02/07/2017 01:09 PM   Modules accepted: Orders

## 2017-02-14 ENCOUNTER — Ambulatory Visit: Payer: Medicare HMO | Admitting: Physician Assistant

## 2017-03-07 NOTE — Progress Notes (Signed)
HPI:   [name] is a 71 y.o. female, who is here today to follow on some chronic medical problems.  She was last seen on 01/18/17 for acute visit.  She has followed with cardiologists since her last OV, Dr Kathlen Mody on 02/04/17. She also follows with neuro (polyneuropathy) and rheuma (RA).  Hypertension:   Currently on Hyzaar 100-12.5 mg daily and Amlodipine 2.5 mg daily.   BP readings 120's/70's. She is following low salt diet. Last eye exam within a year. She is taking medications as instructed, no side effects reported.  She has not noted unusual headache, visual changes, exertional chest pain, dyspnea,  focal weakness, or edema.   Lab Results  Component Value Date   CREATININE 1.18 (H) 10/01/2016   BUN 19 10/01/2016   NA 141 10/01/2016   K 3.9 10/01/2016   CL 106 10/01/2016   CO2 27 10/01/2016     Hot flashes: Paxil 10 mg recommended in 10/2016, she could not afford 7.5 mg. She has not taken it. Gabapentin was recommended for neuropathy and it is helpig with sleep and hot flashes.   Diabetes Mellitus II:   Currently on non pharmacologic treatment. She was on Metformin before. Last eye exam within the past year. Checking BS's : 90's-110. She is not longer on Prednisone. She has had some BS's 60-70's when she skips meals.  She denies abdominal pain, nausea, vomiting, polydipsia, polyuria, or polyphagia. + Numbness and tingling on hands and feet.   Lab Results  Component Value Date   HGBA1C 5.8 11/29/2016   Lab Results  Component Value Date   MICROALBUR 36.4 (H) 06/01/2016    Concerned about her feet feel cold at night when she first gets in bed, eventually they get warm. This is been going on for a while.   + Fatigue, chronic she states that problem seems worse with hot weather every year. No new associated symptom.  She is not exercising regularly. She is trying to watch she eats, she skips meals and snacks on  crackers.   Hyperlipidemia:  Currently on non pharmacologic treatment. Statin med caused arthralgias in the past, she does not remember which one she tried. Following a low fat diet: Yes.   Lab Results  Component Value Date   CHOL 262 (H) 06/01/2016   HDL 64.00 06/01/2016   LDLCALC 173 (H) 06/01/2016   LDLDIRECT 171.5 12/06/2011   TRIG 129.0 06/01/2016   CHOLHDL 4 06/01/2016      Review of Systems  Constitutional: Positive for fatigue. Negative for activity change, appetite change, fever and unexpected weight change.  HENT: Negative for mouth sores, nosebleeds and trouble swallowing.   Eyes: Negative for redness and visual disturbance.  Respiratory: Negative for cough, shortness of breath and wheezing.   Cardiovascular: Negative for chest pain, palpitations and leg swelling.  Gastrointestinal: Negative for abdominal pain, nausea and vomiting.       Negative for changes in bowel habits.  Endocrine: Negative for polydipsia, polyphagia and polyuria.  Genitourinary: Negative for decreased urine volume, dysuria and hematuria.  Musculoskeletal: Negative for gait problem and myalgias.  Skin: Negative for rash and wound.  Allergic/Immunologic: Positive for environmental allergies.  Neurological: Positive for numbness. Negative for syncope, weakness and headaches.  Psychiatric/Behavioral: Negative for confusion and sleep disturbance. The patient is nervous/anxious.       Current Outpatient Prescriptions on File Prior to Visit  Medication Sig Dispense Refill  . acetaminophen (TYLENOL) 500 MG tablet Take 500  mg by mouth 2 (two) times daily as needed (pain).    . cyclobenzaprine (FLEXERIL) 5 MG tablet Take 1 tablet (5 mg total) by mouth 3 (three) times daily as needed for muscle spasms. 30 tablet 1  . fluticasone (FLONASE) 50 MCG/ACT nasal spray Place 1 spray into both nostrils 2 (two) times daily as needed for allergies. 32 g 7  . gabapentin (NEURONTIN) 300 MG capsule Take 1 capsule  (300 mg total) by mouth 2 (two) times daily. 60 capsule 6  . leflunomide (ARAVA) 20 MG tablet Take 20 mg by mouth daily.     [name] Kitchen losartan-hydrochlorothiazide (HYZAAR) 100-12.5 MG tablet Take 1 tablet by mouth daily. 100 tablet 3  . mirabegron ER (MYRBETRIQ) 25 MG TB24 tablet Take 25 mg by mouth daily.    . Multiple Vitamin (MULTIVITAMIN) tablet Take 1 tablet by mouth daily.    . NONFORMULARY OR COMPOUNDED ITEM Rx for compounded cream faxed to [facility]    . Olopatadine HCl (PAZEO) 0.7 % SOLN Apply 1 drop to eye daily. 1 Bottle 2  . ONETOUCH VERIO test strip USE ONCE DAILY FOR CHECK GLUCOSE LEVELS. DX E11.9 100 each 4  . ranitidine (ZANTAC) 150 MG tablet Take 1 tablet (150 mg total) by mouth 2 (two) times daily. 180 tablet 2   No current facility-administered medications on file prior to visit.      Past Medical History:  Diagnosis Date  . Allergy   . Arthritis    RA (Dr. Ouida Sills)  . C. difficile diarrhea    history of  . CKD (chronic kidney disease) stage 3, GFR 30-59 ml/min 10/11/2016   s/p R nephrectomy  . Colon polyps 2008   HYPERPLASTIC  . Costochondritis    a. Nuc Stress Test 6/16: EF 70, no scar or ischemia, low risk // b. Echo 3/16: mild conc LVH, EF 65-70, no RWMA, Gr 1 DD, mild TR  . Diabetes mellitus without complication ([place])    type 2  . Gastritis   . GERD (gastroesophageal reflux disease)   . History of echocardiogram    Echo 3/18:  Moderate LVH, EF 69-62, grade 1 diastolic dysfunction, calcified aortic valve, mild MR, moderate LAE  . History of nuclear stress test    Myoview 3/18: Mod size and intensity fixed septal defect, may be artifact.Opposite mod size and intensity lat defect, which is reversible and could represent ischemia or possibly artifact (SDS 4). LVEF 71% with normal wall motion. Intermediate risk study. >> images reviewed with Dr. Dorris Carnes - no sig ischemia; med rx   . History of pneumonia   . Hx of cardiovascular stress test    Lexiscan Myoview  6/16:  EF 70%, no scar or ischemia; Low Risk  . Hypertension   . Osteoarthritis   . Thyroid disease    Allergies  Allergen Reactions  . Percocet [Oxycodone-Acetaminophen] Hives, Itching and Other (See Comments)    hallucinations  . Hydrocodone Other (See Comments)    "crazy dreams"  . Aspirin Other (See Comments)    REACTION: GI upset    Social History   Social History  . Marital status: Divorced    Spouse name: N/A  . Number of children: 3  . Years of education: 12   Occupational History  . Retired Retired   Social History Main Topics  . Smoking status: Current Every Day Smoker    Years: 15.00    Types: Cigarettes  . Smokeless tobacco: Never Used     Comment:  12/01/16 2 cigarettes per day  . Alcohol use No  . Drug use: No  . Sexual activity: Not Asked   Other Topics Concern  . None   Social History Narrative   Single, dgtr lives with her   Never Smoked    Alcohol use- no   Drug use-no   Regular Exercise-yes   Former Smoker- 12/2008    Vitals:   03/08/17 0851  BP: 140/82  Pulse: 88  Resp: 12  SpO2: 93%   Body mass index is 33.96 kg/m.  Wt Readings from Last 3 Encounters:  03/08/17 210 lb 6 oz (95.4 kg)  02/04/17 210 lb (95.3 kg)  01/18/17 207 lb 8 oz (94.1 kg)     Physical Exam  Nursing note and vitals reviewed. Constitutional: She is oriented to person, place, and time. She appears well-developed. No distress.  HENT:  Head: Normocephalic and atraumatic.  Mouth/Throat: Oropharynx is clear and moist and mucous membranes are normal.  Eyes: Pupils are equal, round, and reactive to light. Conjunctivae and EOM are normal.  Cardiovascular: Normal rate and regular rhythm.   No murmur heard. Pulses:      Dorsalis pedis pulses are 2+ on the right side, and 2+ on the left side.  Normal capillary refill.  Respiratory: Effort normal and breath sounds normal. No respiratory distress.  GI: Soft. She exhibits no mass. There is no hepatomegaly. There is no  tenderness.  Musculoskeletal: She exhibits no edema or tenderness.  Lymphadenopathy:    She has no cervical adenopathy.  Neurological: She is alert and oriented to person, place, and time. She has normal strength. Coordination normal.  Stable gait with no assistance needed.  Skin: Skin is warm. No erythema.  Psychiatric: Her mood appears anxious.  Well groomed, good eye contact.    Diabetic Foot Exam - Simple   Simple Foot Form Diabetic Foot exam was performed with the following findings:  Yes 03/08/2017  9:21 AM  Visual Inspection No deformities, no ulcerations, no other skin breakdown bilaterally:  Yes Sensation Testing See comments:  Yes Pulse Check Posterior Tibialis and Dorsalis pulse intact bilaterally:  Yes Comments Mildly decreased monofilament right foot, medial aspect big toe.      ASSESSMENT AND PLAN:   Ms.Skylyn was seen today for follow-up.  Diagnoses and all orders for this visit:  Lab Results  Component Value Date   HGBA1C 5.8 03/08/2017   Lab Results  Component Value Date   CREATININE 1.24 (H) 03/08/2017   BUN 21 03/08/2017   NA 140 03/08/2017   K 4.7 03/08/2017   CL 103 03/08/2017   CO2 32 03/08/2017   Lab Results  Component Value Date   CHOL 260 (H) 03/08/2017   HDL 66.30 03/08/2017   LDLCALC 172 (H) 03/08/2017   LDLDIRECT 171.5 12/06/2011   TRIG 108.0 03/08/2017   CHOLHDL 4 03/08/2017    Diabetes 1.5, managed as type 2 ([place])  HgA1C has been at goal, pending today. Continue non pharmacologic treatment. Regular exercise and healthy diet with avoidance of added sugar food intake is an important part of treatment and recommended. Annual eye exam, periodic dental and foot care recommended. Smoking cessation encouraged. F/U in 6 months  -     Basic metabolic panel -     Lipid panel -     Hemoglobin A1c -     Fructosamine  Essential hypertension  Overall adequately controlled. No changes in current management. DASH-low salt diet to  continue. Eye exam recommended annually.  F/U in 6 months, before if needed.  -     Basic metabolic panel -     amLODipine (NORVASC) 2.5 MG tablet; Take 2 tablets (5 mg total) by mouth daily.  Hot flashes, menopausal  Gabapentin is helping. No further recommendations at this time. F/U as needed.  Class 1 obesity with serious comorbidity and body mass index (BMI) of 34.0 to 34.9 in adult, unspecified obesity type  We discussed benefits of wt loss as well as adverse effects of obesity. Consistency with healthy diet and physical activity recommended.  Hyperlipidemia, unspecified hyperlipidemia type  No changes in current management, will follow labs done today and will give further recommendations according to 10 years ASCVD risk.  -     Lipid panel    The 10-year ASCVD risk score Mikey Bussing DC Jr., et al., 2013) is: 67.5%   Values used to calculate the score:     Age: 88 years     Sex: Female     Is Non-Hispanic African American: Yes     Diabetic: Yes     Tobacco smoker: Yes     Systolic Blood Pressure: 288 mmHg     Is BP treated: Yes     HDL Cholesterol: 66.3 mg/dL     Total Cholesterol: 260 mg/dL      -Ms. [name] was advised to return sooner than planned today if new concerns arise.       [name]. [name], MD  [facility]. [place] office.

## 2017-03-08 ENCOUNTER — Ambulatory Visit (INDEPENDENT_AMBULATORY_CARE_PROVIDER_SITE_OTHER): Payer: Medicare HMO | Admitting: Family Medicine

## 2017-03-08 ENCOUNTER — Encounter: Payer: Self-pay | Admitting: Family Medicine

## 2017-03-08 VITALS — BP 140/82 | HR 88 | Resp 12 | Ht 66.0 in | Wt 210.4 lb

## 2017-03-08 DIAGNOSIS — N951 Menopausal and female climacteric states: Secondary | ICD-10-CM | POA: Diagnosis not present

## 2017-03-08 DIAGNOSIS — E109 Type 1 diabetes mellitus without complications: Secondary | ICD-10-CM

## 2017-03-08 DIAGNOSIS — Z6834 Body mass index (BMI) 34.0-34.9, adult: Secondary | ICD-10-CM

## 2017-03-08 DIAGNOSIS — E785 Hyperlipidemia, unspecified: Secondary | ICD-10-CM | POA: Diagnosis not present

## 2017-03-08 DIAGNOSIS — I10 Essential (primary) hypertension: Secondary | ICD-10-CM

## 2017-03-08 DIAGNOSIS — E139 Other specified diabetes mellitus without complications: Secondary | ICD-10-CM

## 2017-03-08 DIAGNOSIS — E669 Obesity, unspecified: Secondary | ICD-10-CM | POA: Diagnosis not present

## 2017-03-08 LAB — LIPID PANEL
Cholesterol: 260 mg/dL — ABNORMAL HIGH (ref 0–200)
HDL: 66.3 mg/dL (ref >39.00)
LDL Cholesterol: 172 mg/dL — ABNORMAL HIGH (ref 0–99)
NonHDL: 193.49
Total CHOL/HDL Ratio: 4
Triglycerides: 108 mg/dL (ref 0.0–149.0)
VLDL: 21.6 mg/dL (ref 0.0–40.0)

## 2017-03-08 LAB — BASIC METABOLIC PANEL
BUN: 21 mg/dL (ref 6–23)
CO2: 32 mEq/L (ref 19–32)
Calcium: 10.2 mg/dL (ref 8.4–10.5)
Chloride: 103 mEq/L (ref 96–112)
Creatinine, Ser: 1.24 mg/dL — ABNORMAL HIGH (ref 0.40–1.20)
GFR: 54.51 mL/min — ABNORMAL LOW (ref >60.00)
Glucose, Bld: 95 mg/dL (ref 70–99)
Potassium: 4.7 mEq/L (ref 3.5–5.1)
Sodium: 140 mEq/L (ref 135–145)

## 2017-03-08 LAB — HEMOGLOBIN A1C: Hgb A1c MFr Bld: 5.8 % (ref 4.6–6.5)

## 2017-03-08 NOTE — Patient Instructions (Addendum)
A few things to remember from today's visit:   Diabetes 1.5, managed as type 2 ([place]) - Plan: Basic metabolic panel, Lipid panel, Hemoglobin A1c, Fructosamine  Essential hypertension - Plan: Basic metabolic panel  Hot flashes, menopausal  Class 1 obesity with serious comorbidity and body mass index (BMI) of 34.0 to 34.9 in adult, unspecified obesity type  Hyperlipidemia, unspecified hyperlipidemia type - Plan: Lipid panel   [name], today we have followed on some of your chronic medical problems and they seem to be stable, so no changes in current management today.  Review medication list and be sure it is accurate.  -Remember a healthy diet and regular physical activity are very important for prevention as well as for well being; they also help with many chronic problems, decreasing the need of adding new medications and delaying or preventing possible complications.  I will see you back in 5-6 months.  Remember to arrange your follow up appt before leaving today.  Please follow sooner than planned if a new concern arises.  Please be sure medication list is accurate. If a new problem present, please set up appointment sooner than planned today.

## 2017-03-09 DIAGNOSIS — M79643 Pain in unspecified hand: Secondary | ICD-10-CM | POA: Diagnosis not present

## 2017-03-09 DIAGNOSIS — M0609 Rheumatoid arthritis without rheumatoid factor, multiple sites: Secondary | ICD-10-CM | POA: Diagnosis not present

## 2017-03-09 DIAGNOSIS — M1990 Unspecified osteoarthritis, unspecified site: Secondary | ICD-10-CM | POA: Diagnosis not present

## 2017-03-09 DIAGNOSIS — Z79899 Other long term (current) drug therapy: Secondary | ICD-10-CM | POA: Diagnosis not present

## 2017-03-10 LAB — FRUCTOSAMINE: Fructosamine: 255 umol/L (ref 190–270)

## 2017-03-10 MED ORDER — AMLODIPINE BESYLATE 2.5 MG PO TABS: 5.0000 mg | ORAL_TABLET | Freq: Every day | ORAL | 2 refills | Status: DC

## 2017-03-14 ENCOUNTER — Telehealth: Payer: Self-pay | Admitting: Family Medicine

## 2017-03-14 ENCOUNTER — Encounter: Payer: Self-pay | Admitting: Family Medicine

## 2017-03-14 NOTE — Telephone Encounter (Signed)
Pt view her blood work result on my chart and her chole is elevate. Pt would like to know if she will be put on chole med if so [employer] street. Please call patient

## 2017-03-15 ENCOUNTER — Telehealth: Payer: Self-pay | Admitting: Family Medicine

## 2017-03-15 ENCOUNTER — Other Ambulatory Visit: Payer: Self-pay

## 2017-03-15 DIAGNOSIS — R69 Illness, unspecified: Secondary | ICD-10-CM | POA: Diagnosis not present

## 2017-03-15 MED ORDER — PRAVASTATIN SODIUM 20 MG PO TABS: 20.0000 mg | ORAL_TABLET | Freq: Every day | ORAL | 3 refills | Status: DC

## 2017-03-15 NOTE — Telephone Encounter (Signed)
Same dose she has been taking, I did not change dose last OV. If she is taking Amlodipine 5 mg, tab can be changed to 5 mg instead taking 2 tabs of 2.5 mg.  Thanks, BJ

## 2017-03-15 NOTE — Telephone Encounter (Signed)
1 Rx says 2.5 mg daily, and another Rx says 2.5 mg twice daily.   Which Rx is patient suppose to be taking?  Thanks!

## 2017-03-15 NOTE — Telephone Encounter (Signed)
Pt would like to have a call back for clarification on Amlodipine.

## 2017-03-15 NOTE — Telephone Encounter (Signed)
I spoke with patient. She is currently taking Amlodipine 2.5 mg daily. Advised her to continue taking medication once daily, and Rx for cholesterol medication - Pravastatin 20 mg daily, sent to pharmacy. Patient verbalized understanding.

## 2017-03-16 ENCOUNTER — Encounter: Payer: Self-pay | Admitting: Diagnostic Neuroimaging

## 2017-03-16 ENCOUNTER — Ambulatory Visit (INDEPENDENT_AMBULATORY_CARE_PROVIDER_SITE_OTHER): Payer: Medicare HMO | Admitting: Diagnostic Neuroimaging

## 2017-03-16 VITALS — BP 133/77 | HR 78 | Wt 211.0 lb

## 2017-03-16 DIAGNOSIS — R202 Paresthesia of skin: Secondary | ICD-10-CM

## 2017-03-16 DIAGNOSIS — G6289 Other specified polyneuropathies: Secondary | ICD-10-CM | POA: Diagnosis not present

## 2017-03-16 DIAGNOSIS — G5602 Carpal tunnel syndrome, left upper limb: Secondary | ICD-10-CM

## 2017-03-16 DIAGNOSIS — R200 Anesthesia of skin: Secondary | ICD-10-CM

## 2017-03-16 MED ORDER — GABAPENTIN 300 MG PO CAPS: 300.0000 mg | ORAL_CAPSULE | Freq: Every day | ORAL | 4 refills | Status: DC

## 2017-03-16 NOTE — Progress Notes (Signed)
[facility]  PATIENT: [name] DOB: 1945-11-05  REFERRING CLINICIAN: G Aryal HISTORY FROM: patient  REASON FOR VISIT: follow up   HISTORICAL  CHIEF COMPLAINT:  Chief Complaint  Patient presents with  . Numbness and tingling Left hand    rm 6, "the cream helps some; Gabapentin makes me sleep and helps w/hot flashes, wear splint at times; pain sometimes goes up left arm to shoulder"  . Follow-up    3 month    HISTORY OF PRESENT ILLNESS:   UPDATE 03/16/17: Since last visit, doing a little better with neuropathy cream and gabapentin. Sxs stable. Planning to get second opinion [facility] rheumatology clinic.   PRIOR HPI (12/01/16): 71 year old female with history of rheumatoid arthritis, here for evaluation of left hand numbness and pain. Patient reports numbness in bilateral hands and feet, especially left hand for past 6 months. She describes pain and tenderness in her left fifth digit radiating up to her left elbow. Patient denies any recent accidents or traumas. Patient has history of diabetes, hypertension, renal cell carcinoma, rheumatoid arthritis, currently on leflunomide. Patient presented for EMG nerve conduction study on 10/14/16 which showed diffuse widespread axonal polyneuropathy as well as superimposed left carpal tunnel syndrome. Patient has history of right carpal tunnel syndrome status post surgery with good results.    REVIEW OF SYSTEMS: Full 14 system review of systems performed and negative with exception of:    ALLERGIES: Allergies  Allergen Reactions  . Percocet [Oxycodone-Acetaminophen] Hives, Itching and Other (See Comments)    hallucinations  . Hydrocodone Other (See Comments)    "crazy dreams"  . Aspirin Other (See Comments)    REACTION: GI upset    HOME MEDICATIONS: Outpatient Medications Prior to Visit  Medication Sig Dispense Refill  . acetaminophen (TYLENOL) 500 MG tablet Take 500 mg by mouth 2 (two) times daily as needed (pain).     [name] Kitchen amLODipine (NORVASC) 2.5 MG tablet Take 2 tablets (5 mg total) by mouth daily. 90 tablet 2  . cyclobenzaprine (FLEXERIL) 5 MG tablet Take 1 tablet (5 mg total) by mouth 3 (three) times daily as needed for muscle spasms. 30 tablet 1  . fluticasone (FLONASE) 50 MCG/ACT nasal spray Place 1 spray into both nostrils 2 (two) times daily as needed for allergies. 32 g 7  . gabapentin (NEURONTIN) 300 MG capsule Take 1 capsule (300 mg total) by mouth 2 (two) times daily. 60 capsule 6  . leflunomide (ARAVA) 20 MG tablet Take 20 mg by mouth daily.     [name] Kitchen losartan-hydrochlorothiazide (HYZAAR) 100-12.5 MG tablet Take 1 tablet by mouth daily. 100 tablet 3  . mirabegron ER (MYRBETRIQ) 25 MG TB24 tablet Take 25 mg by mouth daily.    . Multiple Vitamin (MULTIVITAMIN) tablet Take 1 tablet by mouth daily.    . NONFORMULARY OR COMPOUNDED ITEM Rx for compounded cream faxed to [facility]    . Olopatadine HCl (PAZEO) 0.7 % SOLN Apply 1 drop to eye daily. 1 Bottle 2  . ONETOUCH VERIO test strip USE ONCE DAILY FOR CHECK GLUCOSE LEVELS. DX E11.9 100 each 4  . pravastatin (PRAVACHOL) 20 MG tablet Take 1 tablet (20 mg total) by mouth daily. With supper 30 tablet 3  . ranitidine (ZANTAC) 150 MG tablet Take 1 tablet (150 mg total) by mouth 2 (two) times daily. 180 tablet 2   No facility-administered medications prior to visit.     PAST MEDICAL HISTORY: Past Medical History:  Diagnosis Date  . Allergy   .  Arthritis    RA (Dr. Ouida Sills)  . C. difficile diarrhea    history of  . CKD (chronic kidney disease) stage 3, GFR 30-59 ml/min 10/11/2016   s/p R nephrectomy  . Colon polyps 2008   HYPERPLASTIC  . Costochondritis    a. Nuc Stress Test 6/16: EF 70, no scar or ischemia, low risk // b. Echo 3/16: mild conc LVH, EF 65-70, no RWMA, Gr 1 DD, mild TR  . Diabetes mellitus without complication ([place])    type 2  . Gastritis   . GERD (gastroesophageal reflux disease)   . History of echocardiogram    Echo 3/18:   Moderate LVH, EF 83-15, grade 1 diastolic dysfunction, calcified aortic valve, mild MR, moderate LAE  . History of nuclear stress test    Myoview 3/18: Mod size and intensity fixed septal defect, may be artifact.Opposite mod size and intensity lat defect, which is reversible and could represent ischemia or possibly artifact (SDS 4). LVEF 71% with normal wall motion. Intermediate risk study. >> images reviewed with Dr. Dorris Carnes - no sig ischemia; med rx   . History of pneumonia   . Hx of cardiovascular stress test    Lexiscan Myoview 6/16:  EF 70%, no scar or ischemia; Low Risk  . Hypertension   . Osteoarthritis   . Thyroid disease     PAST SURGICAL HISTORY: Past Surgical History:  Procedure Laterality Date  . ABDOMINAL HYSTERECTOMY  1978  . BACK SURGERY     x 2  . CARPAL TUNNEL RELEASE Left   . COLONOSCOPY W/ BIOPSIES AND POLYPECTOMY     Hx: of  . HNP    . LUMBAR LAMINECTOMY/DECOMPRESSION MICRODISCECTOMY Left 02/12/2013   Procedure: LUMBAR TWO THREE, LUMBAR THREE FOUR, LUMBAR FOUR FIVE  LAMINECTOMY/DECOMPRESSION MICRODISCECTOMY 3 LEVELS;  Surgeon: Charlie Pitter, MD;  Location: [facility];  Service: Neurosurgery;  Laterality: Left;  . NEPHRECTOMY Right 2010   10.rcc cancer  . TOTAL KNEE ARTHROPLASTY Right    Redo    FAMILY HISTORY: Family History  Problem Relation Age of Onset  . Rheum arthritis Mother   . Stroke Mother   . Prostate cancer Father   . Heart disease Father   . Diabetes Brother   . Dementia Brother   . Parkinsonism Brother   . Kidney disease Son   . Diabetes Brother   . Cancer Brother   . Dementia Brother   . Colon cancer Neg Hx   . Esophageal cancer Neg Hx   . Pancreatic cancer Neg Hx   . Liver disease Neg Hx     SOCIAL HISTORY:  Social History   Social History  . Marital status: Divorced    Spouse name: N/A  . Number of children: 3  . Years of education: 12   Occupational History  . Retired Retired   Social History Main Topics  . Smoking  status: Current Every Day Smoker    Years: 15.00    Types: Cigarettes  . Smokeless tobacco: Never Used     Comment: 03/16/17  1 pk/week  . Alcohol use No  . Drug use: No  . Sexual activity: Not on file   Other Topics Concern  . Not on file   Social History Narrative   Single, dgtr lives with her   Never Smoked    Alcohol use- no   Drug use-no   Regular Exercise-yes   Former Smoker- 12/2008     PHYSICAL EXAM  GENERAL EXAM/CONSTITUTIONAL: Vitals:  Vitals:   03/16/17 0935  BP: 133/77  Pulse: 78  Weight: 211 lb (95.7 kg)   Body mass index is 34.06 kg/m. No exam data present  Patient is in no distress; well developed, nourished and groomed; neck is supple  CARDIOVASCULAR:  Examination of carotid arteries is normal; no carotid bruits  Regular rate and rhythm, no murmurs  Examination of peripheral vascular system by observation and palpation is normal  EYES:  Ophthalmoscopic exam of optic discs and posterior segments is normal; no papilledema or hemorrhages  MUSCULOSKELETAL:  Gait, strength, tone, movements noted in Neurologic exam below  NEUROLOGIC: MENTAL STATUS:  No flowsheet data found.  awake, alert, oriented to person, place and time  recent and remote memory intact  normal attention and concentration  language fluent, comprehension intact, naming intact,   fund of knowledge appropriate  CRANIAL NERVE:   2nd - no papilledema on fundoscopic exam  2nd, 3rd, 4th, 6th - pupils equal and reactive to light, visual fields full to confrontation, extraocular muscles intact, no nystagmus  5th - facial sensation symmetric  7th - facial strength symmetric  8th - hearing intact  9th - palate elevates symmetrically, uvula midline  11th - shoulder shrug symmetric  12th - tongue protrusion midline  MOTOR:   normal bulk and tone, full strength in the BUE, BLE  SENSORY:   normal and symmetric to light touch, temperature, vibration  DECR PP IN  BILATERAL HANDS AND LEFT FOOT  LEFT HAND SLIGHTLY HYPERSEN TO PP COMPARED TO RIGHT  BORDERLINE PHALEN'S ON LEFT  NEG TINEL'S  COORDINATION:   finger-nose-finger, fine finger movements normal  REFLEXES:   deep tendon reflexes TRACE and symmetric  GAIT/STATION:   narrow based gait    DIAGNOSTIC DATA (LABS, IMAGING, TESTING) - I reviewed patient records, labs, notes, testing and imaging myself where available.  Lab Results  Component Value Date   WBC 6.2 10/01/2016   HGB 13.3 10/01/2016   HCT 40.7 10/01/2016   MCV 90.6 10/01/2016   PLT 207 10/01/2016      Component Value Date/Time   NA 140 03/08/2017 0935   K 4.7 03/08/2017 0935   CL 103 03/08/2017 0935   CO2 32 03/08/2017 0935   GLUCOSE 95 03/08/2017 0935   BUN 21 03/08/2017 0935   CREATININE 1.24 (H) 03/08/2017 0935   CREATININE 1.18 (H) 10/01/2016 1622   CALCIUM 10.2 03/08/2017 0935   PROT 7.3 12/01/2016 0906   ALBUMIN 3.8 06/01/2016 0839   AST 25 06/01/2016 0839   ALT 23 06/01/2016 0839   ALKPHOS 67 06/01/2016 0839   BILITOT 0.6 06/01/2016 0839   GFRNONAA 44 (L) 11/15/2014 1800   GFRAA 51 (L) 11/15/2014 1800   Lab Results  Component Value Date   CHOL 260 (H) 03/08/2017   HDL 66.30 03/08/2017   LDLCALC 172 (H) 03/08/2017   LDLDIRECT 171.5 12/06/2011   TRIG 108.0 03/08/2017   CHOLHDL 4 03/08/2017   Lab Results  Component Value Date   HGBA1C 5.8 03/08/2017   Lab Results  Component Value Date   VFIEPPIR51 884 12/01/2016   Lab Results  Component Value Date   TSH 2.360 12/01/2016    10/14/16 EMG/NCS 1. 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 2. Superimposed left median neuropathy at the wrist consistent with left carpal tunnel syndrome.  12/01/16 LABS: B12, TSH, SPEP, A1c - all negative    ASSESSMENT AND PLAN  71 y.o. year old female here with Rheumatoid arthritis, here for  evaluation of numbness and tingling in the hands and feet, especially  with tenderness and pain in the left hand and left forearm. Left hand and forearm symptoms may be related to combination of arthritis, peripheral neuropathy and left carpal tunnel syndrome.   Doing well on treatment of pain symptoms with gabapentin and compounded neuropathy cream as well as left wrist splint for carpal tunnel syndrome. Patient would like to hold off on surgical evaluation of left carpal tunnel syndrome at this time.   Dx left hand pain: rheumatoid arthritis + axonal peripheral neuropathy (due to rheumatoid arthritis or DMT or other causes) + left carpal tunnel syndrome  1. Axonal neuropathy   2. Numbness and tingling of both feet   3. Left carpal tunnel syndrome      PLAN:  I spent 15 minutes of face to face time with patient. Greater than 50% of time was spent in counseling and coordination of care with patient. In summary we discussed:   NEUROPATHY - continue gabapentin 300mg  at bedtime - continue compounded neuropathy cream  - wear left wrist splint at bedtime; may consider carpal tunnel surgery referral in future with hand surgery consult  Meds ordered this encounter  Medications  . gabapentin (NEURONTIN) 300 MG capsule    Sig: Take 1 capsule (300 mg total) by mouth at bedtime.    Dispense:  90 capsule    Refill:  4   Return in about 6 months (around 09/16/2017) for with NP.    Penni Bombard, MD 4/31/5400, 86:76 AM Certified in Neurology, Neurophysiology and [facility] [address] [place], [place] [address] [phone]

## 2017-03-16 NOTE — Telephone Encounter (Signed)
Rx sent in and patient already aware of results. See phone note for 03/15/17.

## 2017-04-05 ENCOUNTER — Telehealth: Payer: Self-pay | Admitting: Diagnostic Neuroimaging

## 2017-04-05 DIAGNOSIS — G5602 Carpal tunnel syndrome, left upper limb: Secondary | ICD-10-CM

## 2017-04-05 NOTE — Telephone Encounter (Signed)
Patient called office in reference to wanting to process with being referred to a hand surgeon for a consult for carpal tunnel.  Please call

## 2017-04-05 NOTE — Telephone Encounter (Signed)
From your last ofv note you recommended hand surgeon in future.  Pt is asking.  Do you use someone specific?

## 2017-04-05 NOTE — Addendum Note (Signed)
Addended by: Brandon Melnick on: 04/05/2017 04:46 PM   Modules accepted: Orders

## 2017-04-06 ENCOUNTER — Encounter (HOSPITAL_COMMUNITY): Payer: Self-pay | Admitting: *Deleted

## 2017-04-06 ENCOUNTER — Emergency Department (HOSPITAL_COMMUNITY)
Admission: EM | Admit: 2017-04-06 | Discharge: 2017-04-06 | Disposition: A | Discharge status: Home / Self Care | Payer: Medicare HMO | Attending: Emergency Medicine | Admitting: Emergency Medicine

## 2017-04-06 DIAGNOSIS — R69 Illness, unspecified: Secondary | ICD-10-CM | POA: Diagnosis not present

## 2017-04-06 DIAGNOSIS — S46812A Strain of other muscles, fascia and tendons at shoulder and upper arm level, left arm, initial encounter: Secondary | ICD-10-CM

## 2017-04-06 DIAGNOSIS — M542 Cervicalgia: Secondary | ICD-10-CM | POA: Diagnosis not present

## 2017-04-06 DIAGNOSIS — Z8739 Personal history of other diseases of the musculoskeletal system and connective tissue: Secondary | ICD-10-CM | POA: Diagnosis not present

## 2017-04-06 DIAGNOSIS — Y929 Unspecified place or not applicable: Secondary | ICD-10-CM | POA: Diagnosis not present

## 2017-04-06 DIAGNOSIS — M546 Pain in thoracic spine: Secondary | ICD-10-CM | POA: Diagnosis not present

## 2017-04-06 DIAGNOSIS — N183 Chronic kidney disease, stage 3 (moderate): Secondary | ICD-10-CM | POA: Insufficient documentation

## 2017-04-06 DIAGNOSIS — I129 Hypertensive chronic kidney disease with stage 1 through stage 4 chronic kidney disease, or unspecified chronic kidney disease: Secondary | ICD-10-CM | POA: Insufficient documentation

## 2017-04-06 DIAGNOSIS — Y33XXXA Other specified events, undetermined intent, initial encounter: Secondary | ICD-10-CM | POA: Diagnosis not present

## 2017-04-06 DIAGNOSIS — E1122 Type 2 diabetes mellitus with diabetic chronic kidney disease: Secondary | ICD-10-CM | POA: Diagnosis not present

## 2017-04-06 DIAGNOSIS — F17210 Nicotine dependence, cigarettes, uncomplicated: Secondary | ICD-10-CM | POA: Diagnosis not present

## 2017-04-06 DIAGNOSIS — S46912A Strain of unspecified muscle, fascia and tendon at shoulder and upper arm level, left arm, initial encounter: Secondary | ICD-10-CM | POA: Diagnosis not present

## 2017-04-06 DIAGNOSIS — M549 Dorsalgia, unspecified: Secondary | ICD-10-CM

## 2017-04-06 DIAGNOSIS — Y999 Unspecified external cause status: Secondary | ICD-10-CM | POA: Diagnosis not present

## 2017-04-06 DIAGNOSIS — Y939 Activity, unspecified: Secondary | ICD-10-CM | POA: Diagnosis not present

## 2017-04-06 DIAGNOSIS — M79602 Pain in left arm: Secondary | ICD-10-CM | POA: Diagnosis not present

## 2017-04-06 MED ORDER — IBUPROFEN 400 MG PO TABS
400.0000 mg | ORAL_TABLET | Freq: Once | ORAL | Status: AC
  Administered 2017-04-06: 400 mg via ORAL
  Filled 2017-04-06: qty 1

## 2017-04-06 MED ORDER — METHOCARBAMOL 500 MG PO TABS: 500.0000 mg | ORAL_TABLET | Freq: Three times a day (TID) | ORAL | 0 refills | Status: DC | PRN

## 2017-04-06 MED ORDER — METHOCARBAMOL 500 MG PO TABS
750.0000 mg | ORAL_TABLET | Freq: Once | ORAL | Status: AC
  Administered 2017-04-06: 750 mg via ORAL
  Filled 2017-04-06: qty 2

## 2017-04-06 NOTE — ED Triage Notes (Signed)
Pt in via [facility], c/o R upper back pain onset x 2 days, pt denies injury to the area, denies n/v/d, & denies SOB, MAE, A&O x4

## 2017-04-06 NOTE — Discharge Instructions (Signed)
It was our pleasure to provide your ER care today - we hope that you feel better.  Try gentle massage and/or heat therapy to sore area.  Take acetaminophen and/or ibuprofen as need for pain.  You may also take robaxin as need for muscle spasm.   Follow up with primary care doctor in 1 week.  For wrist/carpal tunnel - also follow up with your doctor.   Return to ER if worse, new symptoms, fevers, intractable pain, numbness/weakness, other concern.

## 2017-04-06 NOTE — ED Provider Notes (Signed)
[facility] DEPT Provider Note   CSN: [id number] Arrival date & time: 04/06/17  1135     History   Chief Complaint Chief Complaint  Patient presents with  . Back Pain    HPI [name] is a 71 y.o. female.  Patient c/o left wrist pain and left upper back/trapezius area pain for the past 2 days. Pain is dull, moderate, non radiating. Worse w movements of shoulder and neck, and changing position. No relation to exertion.  Denies radicular pain down arm. No arm numbness/weakness although states hx of 'neuropathy' and carpal tunnel in that arm. Denies recent fall or injury. No swelling. No skin changes or redness. No fever or chills.    The history is provided by the patient.  Back Pain   Pertinent negatives include no chest pain, no fever, no numbness, no headaches, no abdominal pain and no weakness.    Past Medical History:  Diagnosis Date  . Allergy   . Arthritis    RA (Dr. Ouida Sills)  . C. difficile diarrhea    history of  . CKD (chronic kidney disease) stage 3, GFR 30-59 ml/min 10/11/2016   s/p R nephrectomy  . Colon polyps 2008   HYPERPLASTIC  . Costochondritis    a. Nuc Stress Test 6/16: EF 70, no scar or ischemia, low risk // b. Echo 3/16: mild conc LVH, EF 65-70, no RWMA, Gr 1 DD, mild TR  . Diabetes mellitus without complication ([place])    type 2  . Gastritis   . GERD (gastroesophageal reflux disease)   . History of echocardiogram    Echo 3/18:  Moderate LVH, EF 81-01, grade 1 diastolic dysfunction, calcified aortic valve, mild MR, moderate LAE  . History of nuclear stress test    Myoview 3/18: Mod size and intensity fixed septal defect, may be artifact.Opposite mod size and intensity lat defect, which is reversible and could represent ischemia or possibly artifact (SDS 4). LVEF 71% with normal wall motion. Intermediate risk study. >> images reviewed with Dr. Dorris Carnes - no sig ischemia; med rx   . History of pneumonia   . Hx of cardiovascular stress test    Lexiscan Myoview 6/16:  EF 70%, no scar or ischemia; Low Risk  . Hypertension   . Osteoarthritis   . Thyroid disease     Patient Active Problem List   Diagnosis Date Noted  . Class 1 obesity with serious comorbidity and body mass index (BMI) of 34.0 to 34.9 in adult 12/06/2016  . CKD (chronic kidney disease) stage 3, GFR 30-59 ml/min 10/11/2016  . Hot flashes, menopausal 10/01/2016  . Renal failure, acute on chronic (HCC) 10/24/2014  . Degenerative spondylolisthesis 10/15/2014  . Spinal stenosis, lumbar region, with neurogenic claudication 02/12/2013  . Diabetes 1.5, managed as type 2 ([place]) 03/05/2010  . RENAL CELL CANCER 01/14/2009  . PERSONAL HX COLONIC POLYPS 11/14/2008  . HLD (hyperlipidemia) 11/16/2007  . ESOPHAGEAL STRICTURE 10/12/2007  . Rheumatoid arthritis ([place]) 02/27/2007  . Essential hypertension 01/19/2007  . Allergic rhinitis 01/19/2007  . GERD 01/19/2007    Past Surgical History:  Procedure Laterality Date  . ABDOMINAL HYSTERECTOMY  1978  . BACK SURGERY     x 2  . CARPAL TUNNEL RELEASE Left   . COLONOSCOPY W/ BIOPSIES AND POLYPECTOMY     Hx: of  . HNP    . LUMBAR LAMINECTOMY/DECOMPRESSION MICRODISCECTOMY Left 02/12/2013   Procedure: LUMBAR TWO THREE, LUMBAR THREE FOUR, LUMBAR FOUR FIVE  LAMINECTOMY/DECOMPRESSION MICRODISCECTOMY 3 LEVELS;  Surgeon: Charlie Pitter, MD;  Location: [facility];  Service: Neurosurgery;  Laterality: Left;  . NEPHRECTOMY Right 2010   10.rcc cancer  . TOTAL KNEE ARTHROPLASTY Right    Redo    OB History    No data available       Home Medications    Prior to Admission medications   Medication Sig Start Date End Date Taking? Authorizing Provider  acetaminophen (TYLENOL) 500 MG tablet Take 500 mg by mouth 2 (two) times daily as needed (pain).    [provider]  amLODipine (NORVASC) 2.5 MG tablet Take 2 tablets (5 mg total) by mouth daily. 03/10/17   [name], [name], MD  cyclobenzaprine (FLEXERIL) 5 MG tablet Take 1  tablet (5 mg total) by mouth 3 (three) times daily as needed for muscle spasms. 06/08/16   Dorena Cookey, MD  fluticasone (FLONASE) 50 MCG/ACT nasal spray Place 1 spray into both nostrils 2 (two) times daily as needed for allergies. 06/08/16   Dorena Cookey, MD  gabapentin (NEURONTIN) 300 MG capsule Take 1 capsule (300 mg total) by mouth at bedtime. 03/16/17   Penumalli, Earlean Polka, MD  leflunomide (ARAVA) 20 MG tablet Take 20 mg by mouth daily.  12/03/14   [provider]  losartan-hydrochlorothiazide (HYZAAR) 100-12.5 MG tablet Take 1 tablet by mouth daily. 06/08/16   Dorena Cookey, MD  mirabegron ER (MYRBETRIQ) 25 MG TB24 tablet Take 25 mg by mouth daily.    [provider]  Multiple Vitamin (MULTIVITAMIN) tablet Take 1 tablet by mouth daily.    [provider]  NONFORMULARY OR COMPOUNDED ITEM Rx for compounded cream faxed to [facility]    [provider]  Olopatadine HCl (PAZEO) 0.7 % SOLN Apply 1 drop to eye daily. 11/01/16   [name], [name], MD  [facility] VERIO test strip USE ONCE DAILY FOR CHECK GLUCOSE LEVELS. DX E11.9 07/12/16   Dorena Cookey, MD  pravastatin (PRAVACHOL) 20 MG tablet Take 1 tablet (20 mg total) by mouth daily. With supper 03/15/17   [name], [name], MD  ranitidine (ZANTAC) 150 MG tablet Take 1 tablet (150 mg total) by mouth 2 (two) times daily. 11/01/16   [name], [name], MD    Family History Family History  Problem Relation Age of Onset  . Rheum arthritis Mother   . Stroke Mother   . Prostate cancer Father   . Heart disease Father   . Diabetes Brother   . Dementia Brother   . Parkinsonism Brother   . Kidney disease Son   . Diabetes Brother   . Cancer Brother   . Dementia Brother   . Colon cancer Neg Hx   . Esophageal cancer Neg Hx   . Pancreatic cancer Neg Hx   . Liver disease Neg Hx     Social History Social History  Substance Use Topics  . Smoking status: Current Every Day Smoker    Years: 15.00    Types: Cigarettes    . Smokeless tobacco: Never Used     Comment: 03/16/17  1 pk/week  . Alcohol use No     Allergies   Percocet [oxycodone-acetaminophen]; Hydrocodone; and Aspirin   Review of Systems Review of Systems  Constitutional: Negative for chills and fever.  Respiratory: Negative for shortness of breath.   Cardiovascular: Negative for chest pain.  Gastrointestinal: Negative for abdominal pain.  Musculoskeletal: Positive for back pain. Negative for neck pain and neck stiffness.  Skin: Negative for rash.  Neurological: Negative for weakness, numbness and headaches.     Physical Exam Updated Vital Signs BP 134/85 (BP Location: Right Arm)   Pulse 81   Temp (!) 97.4 F (36.3 C) (Oral)   Resp 19   Ht 1.676 m (5\' 6" )   Wt 90.7 kg (200 lb)   SpO2 97%   BMI 32.28 kg/m   Physical Exam  Constitutional: She appears well-developed and well-nourished. No distress.  HENT:  Head: Atraumatic.  Eyes: Conjunctivae are normal. No scleral icterus.  Neck: Normal range of motion. Neck supple. No tracheal deviation present.  Cardiovascular: Normal rate, regular rhythm, normal heart sounds and intact distal pulses.   Pulmonary/Chest: Effort normal and breath sounds normal. No respiratory distress.  Abdominal: Normal appearance. She exhibits no distension. There is no tenderness.  Musculoskeletal: She exhibits no edema.  C/T spine non tender, aligned.  Left trapezius muscular spasm/tenderness, reproducing symptoms. No swelling to area. Tenderness left wrist and thenar area diffusely, no focal bony tenderness, no sts. Radial pulse 2+.  Good rom at left shoulder/elbow/wrist without pain. No arm swelling.   Neurological: She is alert.  Left arm/hand motor fxn grossly intact, good strength. sens grossly intact.   Skin: Skin is warm and dry. No rash noted. She is not diaphoretic.  Psychiatric: She has a normal mood and affect.  Nursing note and vitals reviewed.    ED Treatments / Results  Labs (all  labs ordered are listed, but only abnormal results are displayed) Labs Reviewed - No data to display  EKG  EKG Interpretation None       Radiology No results found.  Procedures Procedures (including critical care time)  Medications Ordered in ED Medications  ibuprofen (ADVIL,MOTRIN) tablet 400 mg (not administered)  methocarbamol (ROBAXIN) tablet 750 mg (not administered)     Initial Impression / Assessment and Plan / ED Course  I have reviewed the triage vital signs and the nursing notes.  Pertinent labs & imaging results that were available during my care of the patient were reviewed by me and considered in my medical decision making (see chart for details).  No meds for pain today.  Pt with left trapezius area muscle pain/spasm.   Motrin po.  Robaxin po.  Reviewed nursing notes and prior charts for additional history.     Final Clinical Impressions(s) / ED Diagnoses   Final diagnoses:  None    New Prescriptions New Prescriptions   No medications on file     Lajean Saver, MD 04/06/17 1424

## 2017-04-06 NOTE — ED Notes (Signed)
C/o spasms in her uipper back

## 2017-04-06 NOTE — ED Notes (Signed)
Pt also c/o L wrist pain, pt states, "I have carpal tunnel and neuropathy."

## 2017-04-11 ENCOUNTER — Encounter: Payer: Self-pay | Admitting: Family Medicine

## 2017-04-11 ENCOUNTER — Ambulatory Visit (INDEPENDENT_AMBULATORY_CARE_PROVIDER_SITE_OTHER): Payer: Medicare HMO | Admitting: Family Medicine

## 2017-04-11 VITALS — BP 136/80 | HR 91 | Temp 97.9°F | Resp 12 | Ht 66.0 in | Wt 211.1 lb

## 2017-04-11 DIAGNOSIS — R5383 Other fatigue: Secondary | ICD-10-CM

## 2017-04-11 DIAGNOSIS — M549 Dorsalgia, unspecified: Secondary | ICD-10-CM | POA: Diagnosis not present

## 2017-04-11 DIAGNOSIS — R1011 Right upper quadrant pain: Secondary | ICD-10-CM

## 2017-04-11 NOTE — Progress Notes (Signed)
ACUTE VISIT   HPI:  Chief Complaint  Patient presents with  . Spasms    [name] is a 71 y.o. female, who is here today complaining of 5 days of upper, bilateral back pain.  I last saw her on 03/08/17 to follow on DM II and HTN.  5 days ago she woke up with tightness,sharp, and burning pain on upper back, 10/10, no radiated, R>L.  She is concerned because she is still having pain,now it is now 4/10.   She denies Hx of trauma or unusual activity that could have caused pain.   She was evaluated in the ER on 04/06/17 and prescribed Methocarbamol 500 mg tid prn. She is concerned about this medication because she think it is causing decreased appetite and "gas" pain on RUQ.Mild,intermittent,she has not identified exacerbating or alleviating factors.   Denies fever,chills,rash, nausea, vomiting, changes in bowel habits, blood in stool or melena.   According to patient, she has had this pain intermittently for about 4 years. Started after she had lower back surgery and prolonged hospitalization due to complications with C Diff.   For years she has been on Flexeril 5 mg 3 times a day as needed.   Muscle Pain  This is a recurrent problem. The current episode started in the past 7 days. The problem occurs intermittently. The problem has been gradually improving since onset. The context of the pain is unknown. The pain is present in the neck and upper back. The pain is medium. The symptoms are aggravated by any movement. Associated symptoms include abdominal pain, fatigue and sensory change. Pertinent negatives include no chest pain, dysuria, fever, headaches, joint swelling, nausea, rash, stiffness, shortness of breath, swollen glands, visual change, vomiting, weakness or wheezing.    C/O having "no energy."  Denies severe/frequent headache, visual changes, chest pain, dyspnea,or edema. Sleeping better since she started Gabapentin. Hx of anxiety.   Currently she is  following with neurologists due to polyneuropathy, Dr Leta Baptist. She states that she was referred to hand specialist to discuss options for carpal tunnel syndrome symptoms right hand. Hx of RA and generalized OA, she follows with rheuma.  Hx of tobacco use.  She does have a Hx of chronic fatigue, which she reported as being worse around this time of the year.   Lab Results  Component Value Date   TSH 2.360 12/01/2016   Lab Results  Component Value Date   WBC 6.2 10/01/2016   HGB 13.3 10/01/2016   HCT 40.7 10/01/2016   MCV 90.6 10/01/2016   PLT 207 10/01/2016   Lab Results  Component Value Date   CREATININE 1.24 (H) 03/08/2017   BUN 21 03/08/2017   NA 140 03/08/2017   K 4.7 03/08/2017   CL 103 03/08/2017   CO2 32 03/08/2017     Review of Systems  Constitutional: Positive for appetite change and fatigue. Negative for fever and unexpected weight change.  HENT: Negative for mouth sores, sore throat, trouble swallowing and voice change.   Respiratory: Negative for cough, shortness of breath and wheezing.   Cardiovascular: Negative for chest pain, palpitations and leg swelling.  Gastrointestinal: Positive for abdominal pain. Negative for blood in stool, nausea and vomiting.       No changes in bowel habits.  Endocrine: Negative for cold intolerance and heat intolerance.  Genitourinary: Negative for decreased urine volume, dysuria and hematuria.  Musculoskeletal: Negative for arthralgias, joint swelling, neck pain and stiffness.  Skin: Negative for  pallor and rash.  Allergic/Immunologic: Positive for environmental allergies.  Neurological: Positive for sensory change and numbness. Negative for syncope, weakness and headaches.  Hematological: Negative for adenopathy. Does not bruise/bleed easily.  Psychiatric/Behavioral: Negative for confusion. The patient is nervous/anxious.       Current Outpatient Prescriptions on File Prior to Visit  Medication Sig Dispense Refill  .  acetaminophen (TYLENOL) 500 MG tablet Take 500 mg by mouth 2 (two) times daily as needed (pain).    [name] Kitchen amLODipine (NORVASC) 2.5 MG tablet Take 2 tablets (5 mg total) by mouth daily. 90 tablet 2  . cyclobenzaprine (FLEXERIL) 5 MG tablet Take 1 tablet (5 mg total) by mouth 3 (three) times daily as needed for muscle spasms. 30 tablet 1  . fluticasone (FLONASE) 50 MCG/ACT nasal spray Place 1 spray into both nostrils 2 (two) times daily as needed for allergies. 32 g 7  . gabapentin (NEURONTIN) 300 MG capsule Take 1 capsule (300 mg total) by mouth at bedtime. 90 capsule 4  . leflunomide (ARAVA) 20 MG tablet Take 20 mg by mouth daily.     [name] Kitchen losartan-hydrochlorothiazide (HYZAAR) 100-12.5 MG tablet Take 1 tablet by mouth daily. 100 tablet 3  . mirabegron ER (MYRBETRIQ) 25 MG TB24 tablet Take 25 mg by mouth daily.    . Multiple Vitamin (MULTIVITAMIN) tablet Take 1 tablet by mouth daily.    . NONFORMULARY OR COMPOUNDED ITEM Rx for compounded cream faxed to [facility]    . Olopatadine HCl (PAZEO) 0.7 % SOLN Apply 1 drop to eye daily. 1 Bottle 2  . ONETOUCH VERIO test strip USE ONCE DAILY FOR CHECK GLUCOSE LEVELS. DX E11.9 100 each 4  . pravastatin (PRAVACHOL) 20 MG tablet Take 1 tablet (20 mg total) by mouth daily. With supper 30 tablet 3  . ranitidine (ZANTAC) 150 MG tablet Take 1 tablet (150 mg total) by mouth 2 (two) times daily. 180 tablet 2   No current facility-administered medications on file prior to visit.      Past Medical History:  Diagnosis Date  . Allergy   . Arthritis    RA (Dr. Ouida Sills)  . C. difficile diarrhea    history of  . CKD (chronic kidney disease) stage 3, GFR 30-59 ml/min 10/11/2016   s/p R nephrectomy  . Colon polyps 2008   HYPERPLASTIC  . Costochondritis    a. Nuc Stress Test 6/16: EF 70, no scar or ischemia, low risk // b. Echo 3/16: mild conc LVH, EF 65-70, no RWMA, Gr 1 DD, mild TR  . Diabetes mellitus without complication ([place])    type 2  . Gastritis   . GERD  (gastroesophageal reflux disease)   . History of echocardiogram    Echo 3/18:  Moderate LVH, EF 41-32, grade 1 diastolic dysfunction, calcified aortic valve, mild MR, moderate LAE  . History of nuclear stress test    Myoview 3/18: Mod size and intensity fixed septal defect, may be artifact.Opposite mod size and intensity lat defect, which is reversible and could represent ischemia or possibly artifact (SDS 4). LVEF 71% with normal wall motion. Intermediate risk study. >> images reviewed with Dr. Dorris Carnes - no sig ischemia; med rx   . History of pneumonia   . Hx of cardiovascular stress test    Lexiscan Myoview 6/16:  EF 70%, no scar or ischemia; Low Risk  . Hypertension   . Osteoarthritis   . Thyroid disease    Allergies  Allergen Reactions  . Percocet [Oxycodone-Acetaminophen] Hives,  Itching and Other (See Comments)    hallucinations  . Hydrocodone Other (See Comments)    "crazy dreams"  . Aspirin Other (See Comments)    REACTION: GI upset    Social History   Social History  . Marital status: Divorced    Spouse name: N/A  . Number of children: 3  . Years of education: 12   Occupational History  . Retired Retired   Social History Main Topics  . Smoking status: Current Every Day Smoker    Years: 15.00    Types: Cigarettes  . Smokeless tobacco: Never Used     Comment: 03/16/17  1 pk/week  . Alcohol use No  . Drug use: No  . Sexual activity: Not Asked   Other Topics Concern  . None   Social History Narrative   Single, dgtr lives with her   Never Smoked    Alcohol use- no   Drug use-no   Regular Exercise-yes   Former Smoker- 12/2008    Vitals:   04/11/17 1620  BP: 136/80  Pulse: 91  Resp: 12  Temp: 97.9 F (36.6 C)  SpO2: 96%   Body mass index is 34.08 kg/m.   Physical Exam  Nursing note and vitals reviewed. Constitutional: She is oriented to person, place, and time. She appears well-developed. She does not appear ill. No distress.  HENT:  Head:  Normocephalic and atraumatic.  Mouth/Throat: Oropharynx is clear and moist and mucous membranes are normal.  Eyes: Conjunctivae are normal.  Cardiovascular: Normal rate and regular rhythm.   Murmur (soft SEM RUSB) heard. Respiratory: Effort normal and breath sounds normal. No respiratory distress.  GI: Soft. She exhibits no mass. There is no hepatomegaly. There is no tenderness.  Musculoskeletal: She exhibits no edema.       Cervical back: She exhibits tenderness. She exhibits no bony tenderness.       Thoracic back: She exhibits tenderness. She exhibits no bony tenderness.  Mild to moderate tenderness upon palpation of paraspinal muscles right cervical and bilateral thoracic, R>L Shoulders ROM otherwise normal, pain is not elicited. Trapezium muscle spasm, R>L and right paraspinal cervical muscles. Cervical ROM with no significant limitation, it elicits mild pain.  Lymphadenopathy:    She has no cervical adenopathy.  Neurological: She is alert and oriented to person, place, and time. She has normal strength. Coordination and gait normal.  Skin: Skin is warm. No rash noted. No erythema.  Psychiatric: Her mood appears anxious.  Well groomed, good eye contact.     ASSESSMENT AND PLAN:   [name] was seen today for spasms.  Diagnoses and all orders for this visit:  Upper back pain  Improved. Seems to be chronic. She has taken Flexeril intermittently and denies side effects. Stop Methocarbamol and instructed to continue Flexeril as needed tid. We dicussed side effects of medication. Topical OTC Icy hot or Asper cream with Lidocaine may help. Local massage ,local ice. I recommend PT but she prefers to hold on this for now. I do not think imaging is needed at this time. Instructed about warning signs.  Other fatigue  We discussed possible etiologies, acute and chronic possible causes. She has Hx of fatigue. Monitor for new associated symptoms. Side effects of muscle  relaxants discussed. F/U as needed.  Abdominal pain, RUQ  Today abdominal examination negative. Stop methocarbamol and monitor for changes. Bland diet and small portions. Adequate hydration. Instructed about warning signs.    30 min face to face OV. > 50%  was dedicated to discussion of Dx  and some side effects of medications. Anxious, extra time was dedicated to education about warning signs and reassurance at this time.     -[name] was advised to seek immediate medical attention if sudden worsening symptoms or to follow if they persist or if new concerns arise. She voices understanding.      Masai Kidd G. [name], MD  [facility]. [place] office.

## 2017-04-11 NOTE — Patient Instructions (Signed)
A few things to remember from today's visit:   Chronic upper back pain  Stop Methocarbamol and try Flexeril you have at home. Caution with falls. Local ice.   Please be sure medication list is accurate. If a new problem present, please set up appointment sooner than planned today.          Thoracic Outlet Syndrome Rehab Ask your health care provider which exercises are safe for you. Do exercises exactly as told by your health care provider and adjust them as directed. It is normal to feel mild stretching, pulling, tightness, or discomfort as you do these exercises, but you should stop right away if you feel sudden pain or your pain gets worse.Do not begin these exercises until told by your health care provider. Stretching and range of motion exercises These exercises warm up your muscles and joints and improve the movement and flexibility of your shoulder. These exercises also help to relieve pain, numbness, and tingling. Exercise A: Gentle chest stretch with "belly" breathing 1. On the floor, place a half foam roller or a bath towel that is rolled up lengthwise. 2. Lie on your back so your spine-all the way from your head to your tailbone-is on top of the foam roller or towel. Relax. You should feel a gentle stretch across your upper chest. ? Keep both feet or heels firmly on the floor. ? You may bend your knees for comfort. ? Let your arms fall naturally to your sides. 3. Breathe deeply and slowly from your belly. You should feel your belly rise each time you breathe in (inhale). Breathe out (exhale) completely before you inhale again. 4. Stay in this position and continue to inhale and exhale for __________ seconds. ? Over time, gradually increase the amount of time that you hold this stretch, as told by your health care provider. Repeat ___3_______ times. Complete this exercise ______1____ times a day. Exercise B: Scalene stretches There are 3 types of scalene stretches. They  vary depending on which direction you are looking while you do the exercise. Your health care provider will tell you which type of scalene stretch to do. 1. Lie on your back. Place the hand from your left / right side under the hip of your left / right side. For example, if your right shoulder is injured, place your right hand under your right hip. 2. Gently and slowly tilt your head toward your healthy shoulder. Follow instructions from your health care provider about which direction you should turn your face while you tilt your head: ? Scalenus posterior stretch: Turn your face toward your healthy shoulder, in the same direction that you tilt your head. ? Scalenus medius stretch: Turn your face toward the ceiling while you tilt your head toward your healthy shoulder. ? Scalenus anterior stretch: Turn your face toward your injured shoulder. This means that you tilt your head one direction (toward your healthy shoulder), but you turn your face to the opposite direction. 3. Hold each stretch for _______10___ seconds. Repeat _____3_____ times. Complete this exercise __1________ times a day. Exercise C: Chin tuck ( axial extension) 1. Using good posture, sit on a stable surface or stand up. If you have trouble keeping good posture, rest your back and head against a stable wall during this exercise. 2. Look straight ahead and slowly move your chin back, toward your neck, until you feel a stretch in the back of your head. ? Your head should slide back. ? Your chin should be slightly  lowered. 3. Hold for __________ seconds. 4. Return to the starting position. Repeat ______3____ times. Complete this exercise ____2______ times a day. Exercise D: Shoulder rolls  1. Sit in a sturdy chair or stand. Keep good posture during the exercise. 2. Shrug your shoulders up toward your ears and gently move your shoulders around in circles going forward. 3. Reverse the direction of your shoulder rolls so you are  moving your shoulders around in circles going backward. Repeat __________ times. Complete this exercise __________ times a day. Exercise E: Chest stretch ( external rotation and abduction) 1. Stand in a doorway with one of your feet slightly in front of the other. This is called a staggered stance. If you cannot reach your forearms to the door frame, do this exercise in a corner of a room. 2. Choose one of the following positions as told by your health care provider: ? Place your hands and forearms on the door frame above your head. ? Place your hands and forearms on the door frame at the height of your head. ? Place your hands on the door frame at the height of your elbows. 3. Slowly move your weight onto your front foot until you feel a stretch across your chest and in the front of your shoulders. Keep your head and chest upright and keep your abdominal muscles tight. 4. Hold for __________ seconds. 5. To release the stretch, shift your weight to your back foot. Repeat __________ times. Complete this stretch __________ times a day Strengthening exercise This exercise builds strength and endurance in your shoulder. Endurance is the ability to use your muscles for a long time, even after they get tired. _____ times a day.  Posture and body mechanics Body mechanics refers to the movements and positions of your body while you do your daily activities. Posture is part of body mechanics. Good posture and healthy body mechanics can help to relieve stress in your body's tissues and joints. Good posture means that your spine is in its natural S-curve position (your spine is neutral), your shoulders are pulled back slightly, and your head is not tipped forward. The following are general guidelines for applying improved posture and body mechanics to your everyday activities. Standing   When standing, keep your spine neutral and your feet about hip-width apart. Keep a slight bend in your knees. Your  ears, shoulders, and hips should line up with each other.  When you do a task in which you stand in one place for a long time, place one foot up on a stable object that is 2-4 inches (5-10 cm) high, such as a footstool. This helps keep your spine neutral. Sitting   When sitting, keep your spine neutral and your feet flat on the floor. Use a footrest, if necessary, and keep your thighs parallel to the floor.Avoid rounding your shoulders, and avoid tilting your head forward.  When working at a desk or a computer, keep your desk at a height where your hands are slightly lower than your elbows. Slide your chair under your desk so you are close enough to maintain good posture.  When working at a computer, place your monitor at a height and where you are looking straight ahead and you do not have to tilt your head forward or downward to look at the screen. Resting   When lying down and resting, avoid positions that are most painful for you.  If you have pain with activities such as sitting, bending, stooping, or squatting (  flexion-based activities), lie in a position in which your body does not bend very much. For example, avoid curling up on your side with your arms and knees near your chest (fetal position).  If you have pain with activities such as standing for a long time or reaching with your arms (extension-based activities), lie with your spine in a neutral position and bend your knees slightly. Try the following positions:  Lying on your side with a pillow between your knees.  Lying on your back with a pillow under your knees. This information is not intended to replace advice given to you by your health care provider. Make sure you discuss any questions you have with your health care provider. Document Released: 07/19/2005 Document Revised: 03/25/2016 Document Reviewed: 04/13/2015 Elsevier Interactive Patient Education  [employer].

## 2017-04-13 DIAGNOSIS — M79645 Pain in left finger(s): Secondary | ICD-10-CM | POA: Diagnosis not present

## 2017-04-13 DIAGNOSIS — G5602 Carpal tunnel syndrome, left upper limb: Secondary | ICD-10-CM | POA: Diagnosis not present

## 2017-04-13 DIAGNOSIS — M180 Bilateral primary osteoarthritis of first carpometacarpal joints: Secondary | ICD-10-CM | POA: Diagnosis not present

## 2017-04-21 ENCOUNTER — Encounter: Payer: Self-pay | Admitting: Family Medicine

## 2017-05-03 ENCOUNTER — Ambulatory Visit (INDEPENDENT_AMBULATORY_CARE_PROVIDER_SITE_OTHER): Payer: Medicare HMO | Admitting: Physician Assistant

## 2017-05-03 ENCOUNTER — Encounter: Payer: Self-pay | Admitting: Physician Assistant

## 2017-05-03 VITALS — BP 136/78 | HR 84 | Ht 65.0 in | Wt 212.0 lb

## 2017-05-03 DIAGNOSIS — K5900 Constipation, unspecified: Secondary | ICD-10-CM | POA: Diagnosis not present

## 2017-05-03 MED ORDER — POLYETHYLENE GLYCOL 3350 17 GM/SCOOP PO POWD: 17.0000 g | Freq: Every day | ORAL | 3 refills | Status: DC

## 2017-05-03 NOTE — Progress Notes (Signed)
Chief Complaint: Constipation  HPI:  [name] is a 71 year old African-American female with a past medical history as listed below who follows with Dr. Ardis Hughs and presents to clinic today with a complaint of constipation.    Please recall patient was followed earlier this year by me for diarrhea and a C. difficile infection. She was on probiotics at that time. She was last seen 09/24/16 with improvement in symptoms.     Patient's last colonoscopy was 04/18/15 by Dr. Deatra Ina, revealing for sessile polyp in the ascending colon and was otherwise normal. Repeat was recommended in 5 years.    Today, the patient notes that over the past 3 weeks she has had trouble with her daily bowel movements. Patient tells me she was no longer having diarrhea and was back to her regular morning bowel movement, but for the past 3 weeks she has no bowel movement in the morning. In fact, she only has one bowel movement every couple of days and never feels completely empty. She tells me she wakes up with a "hurting in my right side", as the patient gets up and moves around and has a bowel movement this pain will go away. Patient did try daily stool softener which did not seem to help at all. She does tell me she is passing gas on a daily basis but is also getting bloated and feels like she has a decreased appetite. She denies any nausea or vomiting or blood in her stools. Patient also tells me she has not used her probiotic for some time now since she was having no further trouble with diarrhea.   Patient denies fever, chills, blood in her stool, melena, weight loss, nausea, vomiting, reflux or symptoms that awaken her at night.     Past Medical History:  Diagnosis Date  . Allergy   . Arthritis    RA (Dr. Ouida Sills)  . C. difficile diarrhea    history of  . CKD (chronic kidney disease) stage 3, GFR 30-59 ml/min ([facility]) 10/11/2016   s/p R nephrectomy  . Colon polyps 2008   HYPERPLASTIC  . Costochondritis    a. Nuc Stress  Test 6/16: EF 70, no scar or ischemia, low risk // b. Echo 3/16: mild conc LVH, EF 65-70, no RWMA, Gr 1 DD, mild TR  . Diabetes mellitus without complication ([facility])    type 2  . Gastritis   . GERD (gastroesophageal reflux disease)   . History of echocardiogram    Echo 3/18:  Moderate LVH, EF 37-16, grade 1 diastolic dysfunction, calcified aortic valve, mild MR, moderate LAE  . History of nuclear stress test    Myoview 3/18: Mod size and intensity fixed septal defect, may be artifact.Opposite mod size and intensity lat defect, which is reversible and could represent ischemia or possibly artifact (SDS 4). LVEF 71% with normal wall motion. Intermediate risk study. >> images reviewed with Dr. Dorris Carnes - no sig ischemia; med rx   . History of pneumonia   . Hx of cardiovascular stress test    Lexiscan Myoview 6/16:  EF 70%, no scar or ischemia; Low Risk  . Hypertension   . Neuropathy   . Osteoarthritis   . Thyroid disease     Past Surgical History:  Procedure Laterality Date  . ABDOMINAL HYSTERECTOMY  1978  . BACK SURGERY     x 2  . CARPAL TUNNEL RELEASE Left   . COLONOSCOPY W/ BIOPSIES AND POLYPECTOMY     Hx: of  .  HNP    . LUMBAR LAMINECTOMY/DECOMPRESSION MICRODISCECTOMY Left 02/12/2013   Procedure: LUMBAR TWO THREE, LUMBAR THREE FOUR, LUMBAR FOUR FIVE  LAMINECTOMY/DECOMPRESSION MICRODISCECTOMY 3 LEVELS;  Surgeon: Charlie Pitter, MD;  Location: [facility];  Service: Neurosurgery;  Laterality: Left;  . NEPHRECTOMY Right 2010   10.rcc cancer  . TOTAL KNEE ARTHROPLASTY Right    Redo    Current Outpatient Prescriptions  Medication Sig Dispense Refill  . acetaminophen (TYLENOL) 500 MG tablet Take 500 mg by mouth 2 (two) times daily as needed (pain).    [name] Kitchen amLODipine (NORVASC) 2.5 MG tablet Take 2 tablets (5 mg total) by mouth daily. 90 tablet 2  . cyclobenzaprine (FLEXERIL) 5 MG tablet Take 1 tablet (5 mg total) by mouth 3 (three) times daily as needed for muscle spasms. 30 tablet 1  .  fluticasone (FLONASE) 50 MCG/ACT nasal spray Place 1 spray into both nostrils 2 (two) times daily as needed for allergies. 32 g 7  . gabapentin (NEURONTIN) 300 MG capsule Take 1 capsule (300 mg total) by mouth at bedtime. 90 capsule 4  . losartan-hydrochlorothiazide (HYZAAR) 100-12.5 MG tablet Take 1 tablet by mouth daily. 100 tablet 3  . Multiple Vitamin (MULTIVITAMIN) tablet Take 1 tablet by mouth daily.    . NONFORMULARY OR COMPOUNDED ITEM Rx for compounded cream faxed to [facility]    . Olopatadine HCl (PAZEO) 0.7 % SOLN Apply 1 drop to eye daily. 1 Bottle 2  . ONETOUCH VERIO test strip USE ONCE DAILY FOR CHECK GLUCOSE LEVELS. DX E11.9 100 each 4  . ranitidine (ZANTAC) 150 MG tablet Take 1 tablet (150 mg total) by mouth 2 (two) times daily. 180 tablet 2  . polyethylene glycol powder (GLYCOLAX/MIRALAX) powder Take 17 g by mouth daily. 500 g 3   No current facility-administered medications for this visit.     Allergies as of 05/03/2017 - Review Complete 05/03/2017  Allergen Reaction Noted  . Percocet [oxycodone-acetaminophen] Hives, Itching, and Other (See Comments) 11/04/2014  . Pravachol [pravastatin sodium] Other (See Comments) 05/03/2017  . Hydrocodone Other (See Comments) 12/04/2014  . Aspirin Other (See Comments)     Family History  Problem Relation Age of Onset  . Rheum arthritis Mother   . Stroke Mother   . Prostate cancer Father   . Heart disease Father   . Diabetes Brother   . Dementia Brother   . Parkinsonism Brother   . Kidney disease Son   . Diabetes Brother   . Cancer Brother   . Dementia Brother   . Colon cancer Neg Hx   . Esophageal cancer Neg Hx   . Pancreatic cancer Neg Hx   . Liver disease Neg Hx     Social History   Social History  . Marital status: Divorced    Spouse name: N/A  . Number of children: 3  . Years of education: 12   Occupational History  . Retired Retired   Social History Main Topics  . Smoking status: Current Every Day Smoker     Years: 15.00    Types: Cigarettes  . Smokeless tobacco: Never Used     Comment: 03/16/17  1 pk/week  . Alcohol use No  . Drug use: No  . Sexual activity: Not on file   Other Topics Concern  . Not on file   Social History Narrative   Single, dgtr lives with her   Never Smoked    Alcohol use- no   Drug use-no   Regular Exercise-yes  Former Smoker- 12/2008    Review of Systems:    Constitutional: No weight loss, fever or chills Cardiovascular: No chest pain Respiratory: No SOB Gastrointestinal: See HPI and otherwise negative   Physical Exam:  Vital signs: BP 136/78 (BP Location: Left Wrist, Patient Position: Sitting, Cuff Size: Normal)   Pulse 84   Ht 5\' 5"  (1.651 m) Comment: height measured without shoes  Wt 212 lb (96.2 kg)   BMI 35.28 kg/m   Constitutional:   Pleasant AA female appears to be in NAD, Well developed, Well nourished, alert and cooperative Respiratory: Respirations even and unlabored. Lungs clear to auscultation bilaterally.   No wheezes, crackles, or rhonchi.  Cardiovascular: Normal S1, S2. No MRG. Regular rate and rhythm. No peripheral edema, cyanosis or pallor.  Gastrointestinal:  Soft,mild distension, nontender. No rebound or guarding. + bowel sounds all 4 quadrants. No appreciable masses or hepatomegaly. Psychiatric:  Demonstrates good judgement and reason without abnormal affect or behaviors.  MOST RECENT LABS AND IMAGING: CBC    Component Value Date/Time   WBC 6.2 10/01/2016 1622   RBC 4.49 10/01/2016 1622   HGB 13.3 10/01/2016 1622   HCT 40.7 10/01/2016 1622   PLT 207 10/01/2016 1622   MCV 90.6 10/01/2016 1622   MCH 29.6 10/01/2016 1622   MCHC 32.7 10/01/2016 1622   RDW 15.1 (H) 10/01/2016 1622   LYMPHSABS 2.2 06/01/2016 0839   MONOABS 0.5 06/01/2016 0839   EOSABS 0.5 06/01/2016 0839   BASOSABS 0.0 06/01/2016 0839    CMP     Component Value Date/Time   NA 140 03/08/2017 0935   K 4.7 03/08/2017 0935   CL 103 03/08/2017 0935    CO2 32 03/08/2017 0935   GLUCOSE 95 03/08/2017 0935   BUN 21 03/08/2017 0935   CREATININE 1.24 (H) 03/08/2017 0935   CREATININE 1.18 (H) 10/01/2016 1622   CALCIUM 10.2 03/08/2017 0935   PROT 7.3 12/01/2016 0906   ALBUMIN 3.8 06/01/2016 0839   AST 25 06/01/2016 0839   ALT 23 06/01/2016 0839   ALKPHOS 67 06/01/2016 0839   BILITOT 0.6 06/01/2016 0839   GFRNONAA 44 (L) 11/15/2014 1800   GFRAA 51 (L) 11/15/2014 1800    Assessment: 1. Constipation: New over the past 3 weeks, no help from stool softeners, no new medicines or diet changes; consider postinfectious IBS after C. difficile earlier this year and probiotic use  Plan: 1. Discussed change in bowel habits with patient. This could be related to postinfectious IBS after C. difficile earlier this year. 2. Recommend the patient start Polyethylene glycol once daily. Did provide a prescription for this. Discussed the patient that she can titrate this as necessary. 3. Encouraged the patient to increase fiber in her diet to at least 25-35 g per day. Also encouraged patient to continue to drink 6-8 8 ounce glasses of water per day 4. Patient to follow in clinic in 4-6 weeks. If no improvement with simple measures, could consider colonoscopy for further evaluation.  Ellouise Newer, PA-C [facility] 05/03/2017, 10:58 AM  Cc: [name], [name], MD

## 2017-05-03 NOTE — Progress Notes (Signed)
I agree with the above note, plan 

## 2017-05-03 NOTE — Patient Instructions (Signed)
We have sent the following medications to your pharmacy for you to pick up at your convenience: Polyethlene gylcol daily

## 2017-05-23 ENCOUNTER — Telehealth: Payer: Self-pay | Admitting: *Deleted

## 2017-05-23 ENCOUNTER — Ambulatory Visit (INDEPENDENT_AMBULATORY_CARE_PROVIDER_SITE_OTHER): Payer: Medicare HMO | Admitting: *Deleted

## 2017-05-23 DIAGNOSIS — Z780 Asymptomatic menopausal state: Secondary | ICD-10-CM

## 2017-05-23 DIAGNOSIS — Z23 Encounter for immunization: Secondary | ICD-10-CM | POA: Diagnosis not present

## 2017-05-23 NOTE — Telephone Encounter (Signed)
Patient requesting order for bone density. Please advise if ok and I will place orders.

## 2017-05-23 NOTE — Telephone Encounter (Signed)
I do not see one in the past 2-3 years,so it is ok to please order.  Thanks, BJ

## 2017-05-23 NOTE — Telephone Encounter (Signed)
Orders placed as directed.  Called patient and left message to call [place] office at 336 [phone] to schedule bone density.

## 2017-05-25 ENCOUNTER — Emergency Department (HOSPITAL_COMMUNITY): Payer: Medicare HMO

## 2017-05-25 ENCOUNTER — Encounter (HOSPITAL_COMMUNITY): Payer: Self-pay | Admitting: Emergency Medicine

## 2017-05-25 ENCOUNTER — Inpatient Hospital Stay (HOSPITAL_COMMUNITY)
Admission: EM | Admit: 2017-05-25 | Discharge: 2017-05-29 | DRG: 247 | Disposition: A | Discharge status: Home / Self Care | Payer: Medicare HMO | Attending: Internal Medicine | Admitting: Internal Medicine

## 2017-05-25 DIAGNOSIS — I25110 Atherosclerotic heart disease of native coronary artery with unstable angina pectoris: Secondary | ICD-10-CM | POA: Diagnosis not present

## 2017-05-25 DIAGNOSIS — Z96651 Presence of right artificial knee joint: Secondary | ICD-10-CM | POA: Diagnosis present

## 2017-05-25 DIAGNOSIS — E1142 Type 2 diabetes mellitus with diabetic polyneuropathy: Secondary | ICD-10-CM | POA: Diagnosis present

## 2017-05-25 DIAGNOSIS — Z87891 Personal history of nicotine dependence: Secondary | ICD-10-CM

## 2017-05-25 DIAGNOSIS — Z6835 Body mass index (BMI) 35.0-35.9, adult: Secondary | ICD-10-CM

## 2017-05-25 DIAGNOSIS — N183 Chronic kidney disease, stage 3 unspecified: Secondary | ICD-10-CM | POA: Diagnosis present

## 2017-05-25 DIAGNOSIS — E66811 Obesity, class 1: Secondary | ICD-10-CM

## 2017-05-25 DIAGNOSIS — E1122 Type 2 diabetes mellitus with diabetic chronic kidney disease: Secondary | ICD-10-CM | POA: Diagnosis present

## 2017-05-25 DIAGNOSIS — M069 Rheumatoid arthritis, unspecified: Secondary | ICD-10-CM | POA: Diagnosis present

## 2017-05-25 DIAGNOSIS — Z23 Encounter for immunization: Secondary | ICD-10-CM | POA: Diagnosis not present

## 2017-05-25 DIAGNOSIS — I10 Essential (primary) hypertension: Secondary | ICD-10-CM | POA: Diagnosis not present

## 2017-05-25 DIAGNOSIS — I358 Other nonrheumatic aortic valve disorders: Secondary | ICD-10-CM | POA: Diagnosis present

## 2017-05-25 DIAGNOSIS — Z85528 Personal history of other malignant neoplasm of kidney: Secondary | ICD-10-CM

## 2017-05-25 DIAGNOSIS — Z79899 Other long term (current) drug therapy: Secondary | ICD-10-CM

## 2017-05-25 DIAGNOSIS — R079 Chest pain, unspecified: Secondary | ICD-10-CM | POA: Diagnosis not present

## 2017-05-25 DIAGNOSIS — E785 Hyperlipidemia, unspecified: Secondary | ICD-10-CM | POA: Diagnosis not present

## 2017-05-25 DIAGNOSIS — E669 Obesity, unspecified: Secondary | ICD-10-CM | POA: Diagnosis present

## 2017-05-25 DIAGNOSIS — I119 Hypertensive heart disease without heart failure: Secondary | ICD-10-CM | POA: Diagnosis present

## 2017-05-25 DIAGNOSIS — E1129 Type 2 diabetes mellitus with other diabetic kidney complication: Secondary | ICD-10-CM

## 2017-05-25 DIAGNOSIS — Z888 Allergy status to other drugs, medicaments and biological substances status: Secondary | ICD-10-CM

## 2017-05-25 DIAGNOSIS — I2510 Atherosclerotic heart disease of native coronary artery without angina pectoris: Secondary | ICD-10-CM | POA: Diagnosis present

## 2017-05-25 DIAGNOSIS — N184 Chronic kidney disease, stage 4 (severe): Secondary | ICD-10-CM | POA: Diagnosis present

## 2017-05-25 DIAGNOSIS — E1169 Type 2 diabetes mellitus with other specified complication: Secondary | ICD-10-CM | POA: Diagnosis present

## 2017-05-25 DIAGNOSIS — G629 Polyneuropathy, unspecified: Secondary | ICD-10-CM

## 2017-05-25 DIAGNOSIS — I951 Orthostatic hypotension: Secondary | ICD-10-CM | POA: Diagnosis not present

## 2017-05-25 DIAGNOSIS — K219 Gastro-esophageal reflux disease without esophagitis: Secondary | ICD-10-CM | POA: Diagnosis present

## 2017-05-25 DIAGNOSIS — R05 Cough: Secondary | ICD-10-CM | POA: Diagnosis not present

## 2017-05-25 DIAGNOSIS — I129 Hypertensive chronic kidney disease with stage 1 through stage 4 chronic kidney disease, or unspecified chronic kidney disease: Secondary | ICD-10-CM | POA: Diagnosis present

## 2017-05-25 DIAGNOSIS — Z981 Arthrodesis status: Secondary | ICD-10-CM

## 2017-05-25 DIAGNOSIS — Z905 Acquired absence of kidney: Secondary | ICD-10-CM

## 2017-05-25 DIAGNOSIS — M1990 Unspecified osteoarthritis, unspecified site: Secondary | ICD-10-CM | POA: Diagnosis present

## 2017-05-25 DIAGNOSIS — Z886 Allergy status to analgesic agent status: Secondary | ICD-10-CM

## 2017-05-25 DIAGNOSIS — Z95820 Peripheral vascular angioplasty status with implants and grafts: Secondary | ICD-10-CM

## 2017-05-25 DIAGNOSIS — Z6834 Body mass index (BMI) 34.0-34.9, adult: Secondary | ICD-10-CM

## 2017-05-25 DIAGNOSIS — I700 Atherosclerosis of aorta: Secondary | ICD-10-CM | POA: Diagnosis not present

## 2017-05-25 DIAGNOSIS — R0602 Shortness of breath: Secondary | ICD-10-CM | POA: Diagnosis not present

## 2017-05-25 DIAGNOSIS — Z885 Allergy status to narcotic agent status: Secondary | ICD-10-CM

## 2017-05-25 DIAGNOSIS — I2582 Chronic total occlusion of coronary artery: Secondary | ICD-10-CM | POA: Diagnosis present

## 2017-05-25 HISTORY — DX: Peripheral vascular angioplasty status with implants and grafts: Z95.820

## 2017-05-25 HISTORY — DX: Orthostatic hypotension: I95.1

## 2017-05-25 LAB — BASIC METABOLIC PANEL
Anion gap: 8 (ref 5–15)
BUN: 16 mg/dL (ref 6–20)
CO2: 26 mmol/L (ref 22–32)
Calcium: 9.9 mg/dL (ref 8.9–10.3)
Chloride: 105 mmol/L (ref 101–111)
Creatinine, Ser: 1.14 mg/dL — ABNORMAL HIGH (ref 0.44–1.00)
GFR calc Af Amer: 54 mL/min — ABNORMAL LOW (ref >60)
GFR calc non Af Amer: 47 mL/min — ABNORMAL LOW (ref >60)
Glucose, Bld: 97 mg/dL (ref 65–99)
Potassium: 3.5 mmol/L (ref 3.5–5.1)
Sodium: 139 mmol/L (ref 135–145)

## 2017-05-25 LAB — HEPATIC FUNCTION PANEL
ALT: 17 U/L (ref 14–54)
AST: 25 U/L (ref 15–41)
Albumin: 3.7 g/dL (ref 3.5–5.0)
Alkaline Phosphatase: 76 U/L (ref 38–126)
Bilirubin, Direct: <0.1 mg/dL — ABNORMAL LOW (ref 0.1–0.5)
Total Bilirubin: 0.5 mg/dL (ref 0.3–1.2)
Total Protein: 7.8 g/dL (ref 6.5–8.1)

## 2017-05-25 LAB — I-STAT TROPONIN, ED: Troponin i, poc: 0 ng/mL (ref 0.00–0.08)

## 2017-05-25 LAB — CBC
HCT: 40.9 % (ref 36.0–46.0)
Hemoglobin: 13.8 g/dL (ref 12.0–15.0)
MCH: 29.8 pg (ref 26.0–34.0)
MCHC: 33.7 g/dL (ref 30.0–36.0)
MCV: 88.3 fL (ref 78.0–100.0)
Platelets: 268 10*3/uL (ref 150–400)
RBC: 4.63 MIL/uL (ref 3.87–5.11)
RDW: 15.5 % (ref 11.5–15.5)
WBC: 8.1 10*3/uL (ref 4.0–10.5)

## 2017-05-25 LAB — LIPASE, BLOOD: Lipase: 37 U/L (ref 11–51)

## 2017-05-25 LAB — HEMOGLOBIN A1C
Hgb A1c MFr Bld: 5.9 % — ABNORMAL HIGH (ref 4.8–5.6)
Mean Plasma Glucose: 122.63 mg/dL

## 2017-05-25 LAB — GLUCOSE, CAPILLARY
Glucose-Capillary: 116 mg/dL — ABNORMAL HIGH (ref 65–99)
Glucose-Capillary: 129 mg/dL — ABNORMAL HIGH (ref 65–99)

## 2017-05-25 LAB — TROPONIN I: Troponin I: <0.03 ng/mL (ref <0.03)

## 2017-05-25 MED ORDER — IOPAMIDOL (ISOVUE-370) INJECTION 76%
INTRAVENOUS | Status: AC
  Administered 2017-05-25: 100 mL via INTRAVENOUS
  Filled 2017-05-25: qty 100

## 2017-05-25 MED ORDER — SODIUM CHLORIDE 0.9 % IV BOLUS (SEPSIS)
500.0000 mL | Freq: Once | INTRAVENOUS | Status: AC
  Administered 2017-05-25: 500 mL via INTRAVENOUS

## 2017-05-25 MED ORDER — AMLODIPINE BESYLATE 5 MG PO TABS
5.0000 mg | ORAL_TABLET | Freq: Every day | ORAL | Status: DC
  Administered 2017-05-25: 5 mg via ORAL
  Filled 2017-05-25: qty 1

## 2017-05-25 MED ORDER — ONDANSETRON HCL 4 MG/2ML IJ SOLN
4.0000 mg | Freq: Four times a day (QID) | INTRAMUSCULAR | Status: DC | PRN
  Administered 2017-05-26: 4 mg via INTRAVENOUS
  Filled 2017-05-25: qty 2

## 2017-05-25 MED ORDER — GABAPENTIN 300 MG PO CAPS
300.0000 mg | ORAL_CAPSULE | Freq: Every day | ORAL | Status: DC
  Administered 2017-05-25 – 2017-05-28 (×4): 300 mg via ORAL
  Filled 2017-05-25 (×4): qty 1

## 2017-05-25 MED ORDER — ACETAMINOPHEN 325 MG PO TABS
650.0000 mg | ORAL_TABLET | ORAL | Status: DC | PRN
  Administered 2017-05-25: 650 mg via ORAL
  Filled 2017-05-25: qty 2

## 2017-05-25 MED ORDER — FENTANYL CITRATE (PF) 100 MCG/2ML IJ SOLN
50.0000 ug | Freq: Once | INTRAMUSCULAR | Status: AC
  Administered 2017-05-25: 50 ug via INTRAVENOUS
  Filled 2017-05-25: qty 2

## 2017-05-25 MED ORDER — CYCLOBENZAPRINE HCL 10 MG PO TABS
5.0000 mg | ORAL_TABLET | Freq: Three times a day (TID) | ORAL | Status: DC | PRN
  Administered 2017-05-25: 5 mg via ORAL
  Filled 2017-05-25: qty 1

## 2017-05-25 MED ORDER — OLOPATADINE HCL 0.7 % OP SOLN: 1.0000 [drp] | Freq: Every day | OPHTHALMIC | Status: DC

## 2017-05-25 MED ORDER — ADULT MULTIVITAMIN W/MINERALS CH
1.0000 | ORAL_TABLET | Freq: Every day | ORAL | Status: DC
  Administered 2017-05-27 – 2017-05-29 (×3): 1 via ORAL
  Filled 2017-05-25 (×4): qty 1

## 2017-05-25 MED ORDER — KETOROLAC TROMETHAMINE 30 MG/ML IJ SOLN
30.0000 mg | Freq: Once | INTRAMUSCULAR | Status: AC
  Administered 2017-05-25: 30 mg via INTRAVENOUS
  Filled 2017-05-25: qty 1

## 2017-05-25 MED ORDER — SODIUM CHLORIDE 0.45 % IV SOLN
INTRAVENOUS | Status: DC
  Administered 2017-05-25: 17:00:00 via INTRAVENOUS

## 2017-05-25 MED ORDER — ENOXAPARIN SODIUM 40 MG/0.4ML ~~LOC~~ SOLN
40.0000 mg | SUBCUTANEOUS | Status: DC
  Administered 2017-05-25 – 2017-05-26 (×2): 40 mg via SUBCUTANEOUS
  Filled 2017-05-25 (×3): qty 0.4

## 2017-05-25 MED ORDER — GI COCKTAIL ~~LOC~~
30.0000 mL | Freq: Once | ORAL | Status: AC
  Administered 2017-05-25: 30 mL via ORAL
  Filled 2017-05-25: qty 30

## 2017-05-25 MED ORDER — MORPHINE SULFATE (PF) 4 MG/ML IV SOLN
4.0000 mg | Freq: Once | INTRAVENOUS | Status: AC
  Administered 2017-05-25: 4 mg via INTRAVENOUS
  Filled 2017-05-25: qty 1

## 2017-05-25 MED ORDER — ALPRAZOLAM 0.25 MG PO TABS
0.2500 mg | ORAL_TABLET | Freq: Two times a day (BID) | ORAL | Status: DC | PRN
  Administered 2017-05-25 – 2017-05-26 (×2): 0.25 mg via ORAL
  Filled 2017-05-25 (×2): qty 1

## 2017-05-25 MED ORDER — ZOLPIDEM TARTRATE 5 MG PO TABS
5.0000 mg | ORAL_TABLET | Freq: Every evening | ORAL | Status: DC | PRN
  Administered 2017-05-25 – 2017-05-26 (×2): 5 mg via ORAL
  Filled 2017-05-25 (×2): qty 1

## 2017-05-25 MED ORDER — NITROGLYCERIN 0.4 MG SL SUBL
0.4000 mg | SUBLINGUAL_TABLET | SUBLINGUAL | Status: DC | PRN
  Administered 2017-05-25 (×5): 0.4 mg via SUBLINGUAL
  Filled 2017-05-25: qty 1

## 2017-05-25 MED ORDER — PANTOPRAZOLE SODIUM 40 MG IV SOLR
40.0000 mg | Freq: Once | INTRAVENOUS | Status: AC
  Administered 2017-05-25: 40 mg via INTRAVENOUS
  Filled 2017-05-25: qty 40

## 2017-05-25 MED ORDER — FLUTICASONE PROPIONATE 50 MCG/ACT NA SUSP
1.0000 | Freq: Two times a day (BID) | NASAL | Status: DC | PRN
  Filled 2017-05-25: qty 16

## 2017-05-25 MED ORDER — INSULIN ASPART 100 UNIT/ML ~~LOC~~ SOLN
0.0000 [IU] | Freq: Three times a day (TID) | SUBCUTANEOUS | Status: DC
  Administered 2017-05-26: 2 [IU] via SUBCUTANEOUS

## 2017-05-25 MED ORDER — ASPIRIN EC 325 MG PO TBEC
325.0000 mg | DELAYED_RELEASE_TABLET | Freq: Once | ORAL | Status: AC
  Administered 2017-05-25: 325 mg via ORAL
  Filled 2017-05-25: qty 1

## 2017-05-25 MED ORDER — PREDNISONE 20 MG PO TABS
60.0000 mg | ORAL_TABLET | Freq: Once | ORAL | Status: AC
  Administered 2017-05-25: 60 mg via ORAL
  Filled 2017-05-25: qty 3

## 2017-05-25 MED ORDER — INSULIN ASPART 100 UNIT/ML ~~LOC~~ SOLN
0.0000 [IU] | Freq: Every day | SUBCUTANEOUS | Status: DC
Start: 2017-05-25 — End: 2017-05-29

## 2017-05-25 MED ORDER — FAMOTIDINE 20 MG PO TABS
20.0000 mg | ORAL_TABLET | Freq: Two times a day (BID) | ORAL | Status: DC
  Administered 2017-05-25 – 2017-05-29 (×8): 20 mg via ORAL
  Filled 2017-05-25 (×9): qty 1

## 2017-05-25 MED ORDER — PNEUMOCOCCAL VAC POLYVALENT 25 MCG/0.5ML IJ INJ
0.5000 mL | INJECTION | INTRAMUSCULAR | Status: AC
  Administered 2017-05-26: 0.5 mL via INTRAMUSCULAR
  Filled 2017-05-25: qty 0.5

## 2017-05-25 MED ORDER — POLYETHYLENE GLYCOL 3350 17 GM/SCOOP PO POWD
17.0000 g | Freq: Every day | ORAL | Status: DC
  Filled 2017-05-25: qty 255

## 2017-05-25 NOTE — ED Notes (Signed)
Pt requesting pain medication.  Notified admitting MD.

## 2017-05-25 NOTE — ED Notes (Signed)
ED Provider at bedside. 

## 2017-05-25 NOTE — ED Triage Notes (Signed)
Pt presents to ER with family c/o CP that began last night but got better over the night, now significantly worse; pt reports she thought it was her acid reflux so took a zantac but no improvement; pt also slightly sob, nauseated, and got warm on way over causing her to roll car window down; pt alert and oriented currently

## 2017-05-25 NOTE — ED Provider Notes (Signed)
[facility] EMERGENCY DEPARTMENT Provider Note   CSN: [id number] Arrival date & time: 05/25/17  0549     History   Chief Complaint Chief Complaint  Patient presents with  . Chest Pain    HPI [name] is a 71 y.o. female.  The history is provided by the patient. No language interpreter was used.  Chest Pain      [name] is a 71 y.o. female who presents to the Emergency Department complaining of chest pain.  Last night she developed central chest pain described as a burning sensation.  She thought it was reflux and she took a Zantac at home and symptoms partially improved and she was able to sleep at night.  When she awoke this morning she had severe recurrent symptoms.  She has mild associated nausea as well as generalized mild abdominal discomfort.  She developed diaphoresis and felt very hot in route to the emergency department.  No fever, cough, vomiting, diarrhea, leg swelling or pain.  She does endorse mild shortness of breath that she has experienced since her flu shot on Monday.  No prior similar symptoms.  She does have a history of hypertension and diabetes.  She last had a stress test in March of this year.  Past Medical History:  Diagnosis Date  . Allergy   . Arthritis    RA (Dr. Ouida Sills)  . C. difficile diarrhea    history of  . CKD (chronic kidney disease) stage 3, GFR 30-59 ml/min ([facility]) 10/11/2016   s/p R nephrectomy  . Colon polyps 2008   HYPERPLASTIC  . Costochondritis    a. Nuc Stress Test 6/16: EF 70, no scar or ischemia, low risk // b. Echo 3/16: mild conc LVH, EF 65-70, no RWMA, Gr 1 DD, mild TR  . Diabetes mellitus without complication ([facility])    type 2  . Gastritis   . GERD (gastroesophageal reflux disease)   . History of echocardiogram    Echo 3/18:  Moderate LVH, EF 85-46, grade 1 diastolic dysfunction, calcified aortic valve, mild MR, moderate LAE  . History of nuclear stress test    Myoview 3/18: Mod size and intensity  fixed septal defect, may be artifact.Opposite mod size and intensity lat defect, which is reversible and could represent ischemia or possibly artifact (SDS 4). LVEF 71% with normal wall motion. Intermediate risk study. >> images reviewed with Dr. Dorris Carnes - no sig ischemia; med rx   . History of pneumonia   . Hx of cardiovascular stress test    Lexiscan Myoview 6/16:  EF 70%, no scar or ischemia; Low Risk  . Hypertension   . Neuropathy   . Osteoarthritis   . Thyroid disease     Patient Active Problem List   Diagnosis Date Noted  . Class 1 obesity with serious comorbidity and body mass index (BMI) of 34.0 to 34.9 in adult 12/06/2016  . CKD (chronic kidney disease) stage 3, GFR 30-59 ml/min ([facility]) 10/11/2016  . Hot flashes, menopausal 10/01/2016  . Renal failure, acute on chronic ([facility]) 10/24/2014  . Degenerative spondylolisthesis 10/15/2014  . Spinal stenosis, lumbar region, with neurogenic claudication 02/12/2013  . Diabetes 1.5, managed as type 2 ([facility]) 03/05/2010  . RENAL CELL CANCER 01/14/2009  . PERSONAL HX COLONIC POLYPS 11/14/2008  . HLD (hyperlipidemia) 11/16/2007  . ESOPHAGEAL STRICTURE 10/12/2007  . Rheumatoid arthritis ([facility]) 02/27/2007  . Essential hypertension 01/19/2007  . Allergic rhinitis 01/19/2007  . GERD 01/19/2007  Past Surgical History:  Procedure Laterality Date  . ABDOMINAL HYSTERECTOMY  1978  . BACK SURGERY     x 2  . CARPAL TUNNEL RELEASE Left   . COLONOSCOPY W/ BIOPSIES AND POLYPECTOMY     Hx: of  . HNP    . LUMBAR LAMINECTOMY/DECOMPRESSION MICRODISCECTOMY Left 02/12/2013   Procedure: LUMBAR TWO THREE, LUMBAR THREE FOUR, LUMBAR FOUR FIVE  LAMINECTOMY/DECOMPRESSION MICRODISCECTOMY 3 LEVELS;  Surgeon: Charlie Pitter, MD;  Location: [facility];  Service: Neurosurgery;  Laterality: Left;  . NEPHRECTOMY Right 2010   10.rcc cancer  . TOTAL KNEE ARTHROPLASTY Right    Redo    OB History    No data available       Home Medications    Prior to  Admission medications   Medication Sig Start Date End Date Taking? Authorizing Provider  acetaminophen (TYLENOL) 500 MG tablet Take 500 mg by mouth 2 (two) times daily as needed (pain).    [provider]  amLODipine (NORVASC) 2.5 MG tablet Take 2 tablets (5 mg total) by mouth daily. 03/10/17   [name], [name], MD  cyclobenzaprine (FLEXERIL) 5 MG tablet Take 1 tablet (5 mg total) by mouth 3 (three) times daily as needed for muscle spasms. 06/08/16   Dorena Cookey, MD  fluticasone (FLONASE) 50 MCG/ACT nasal spray Place 1 spray into both nostrils 2 (two) times daily as needed for allergies. 06/08/16   Dorena Cookey, MD  gabapentin (NEURONTIN) 300 MG capsule Take 1 capsule (300 mg total) by mouth at bedtime. 03/16/17   Penumalli, Earlean Polka, MD  losartan-hydrochlorothiazide (HYZAAR) 100-12.5 MG tablet Take 1 tablet by mouth daily. 06/08/16   Dorena Cookey, MD  Multiple Vitamin (MULTIVITAMIN) tablet Take 1 tablet by mouth daily.    [provider]  NONFORMULARY OR COMPOUNDED ITEM Rx for compounded cream faxed to [facility]    [provider]  Olopatadine HCl (PAZEO) 0.7 % SOLN Apply 1 drop to eye daily. 11/01/16   [name], [name], MD  [facility] VERIO test strip USE ONCE DAILY FOR CHECK GLUCOSE LEVELS. DX E11.9 07/12/16   Dorena Cookey, MD  polyethylene glycol powder (GLYCOLAX/MIRALAX) powder Take 17 g by mouth daily. 05/03/17   Levin Erp, PA  ranitidine (ZANTAC) 150 MG tablet Take 1 tablet (150 mg total) by mouth 2 (two) times daily. 11/01/16   [name], [name], MD    Family History Family History  Problem Relation Age of Onset  . Rheum arthritis Mother   . Stroke Mother   . Prostate cancer Father   . Heart disease Father   . Diabetes Brother   . Dementia Brother   . Parkinsonism Brother   . Kidney disease Son   . Diabetes Brother   . Cancer Brother   . Dementia Brother   . Colon cancer Neg Hx   . Esophageal cancer Neg Hx   . Pancreatic cancer Neg Hx     . Liver disease Neg Hx     Social History Social History  Substance Use Topics  . Smoking status: Current Every Day Smoker    Years: 15.00    Types: Cigarettes  . Smokeless tobacco: Never Used     Comment: 03/16/17  1 pk/week  . Alcohol use No     Allergies   Percocet [oxycodone-acetaminophen]; Pravachol [pravastatin sodium]; Hydrocodone; and Aspirin   Review of Systems Review of Systems  Cardiovascular: Positive for chest pain.  All other systems reviewed and are  negative.    Physical Exam Updated Vital Signs BP (!) 161/92   Pulse 68   Temp 97.6 F (36.4 C) (Oral)   Resp 17   Ht 5\' 5"  (1.651 m)   Wt 90.7 kg (200 lb)   SpO2 97%   BMI 33.28 kg/m   Physical Exam  Constitutional: She is oriented to person, place, and time. She appears well-developed and well-nourished.  HENT:  Head: Normocephalic and atraumatic.  Cardiovascular: Normal rate and regular rhythm.   No murmur heard. Pulmonary/Chest: Effort normal and breath sounds normal. No respiratory distress.  Abdominal: Soft. There is no rebound and no guarding.  Mild generalized abdominal tenderness  Musculoskeletal: She exhibits no edema or tenderness.  Neurological: She is alert and oriented to person, place, and time.  Skin: Skin is warm and dry.  Psychiatric: She has a normal mood and affect. Her behavior is normal.  Nursing note and vitals reviewed.    ED Treatments / Results  Labs (all labs ordered are listed, but only abnormal results are displayed) Labs Reviewed  BASIC METABOLIC PANEL - Abnormal; Notable for the following:       Result Value   Creatinine, Ser 1.14 (*)    GFR calc non Af Amer 47 (*)    GFR calc Af Amer 54 (*)    All other components within normal limits  CBC  HEPATIC FUNCTION PANEL  LIPASE, BLOOD  I-STAT TROPONIN, ED    EKG  EKG Interpretation  Date/Time:  Wednesday May 25 2017 05:59:55 EDT Ventricular Rate:  67 PR Interval:  172 QRS Duration: 82 QT  Interval:  416 QTC Calculation: 439 R Axis:   -4 Text Interpretation:  Normal sinus rhythm Possible Left atrial enlargement Septal infarct , age undetermined Abnormal ECG No significant change since last tracing Confirmed by Quintella Reichert [phone]) on 05/25/2017 7:20:03 AM       Radiology Dg Chest 2 View  Result Date: 05/25/2017 CLINICAL DATA:  Chest pain and shortness of breath, cough for 1 day. History of hypertension, diabetes, smoker. EXAM: CHEST  2 VIEW COMPARISON:  Chest radiograph October 01, 2016 FINDINGS: The cardiac silhouette is mildly enlarged and unchanged. Mediastinal silhouette is nonsuspicious. Bibasilar strandy densities, similar to increased from prior examination. Bronchitic changes. No pleural effusion. No pneumothorax. Mild degenerative change of the thoracic spine. IMPRESSION: Worsening bibasilar atelectasis/scarring. Mild bronchitic changes. Mild cardiomegaly. Electronically Signed   By: Elon Alas M.D.   On: 05/25/2017 06:28    Procedures Procedures (including critical care time)  Medications Ordered in ED Medications  pantoprazole (PROTONIX) injection 40 mg (not administered)  gi cocktail (Maalox,Lidocaine,Donnatal) (not administered)  sodium chloride 0.9 % bolus 500 mL (not administered)     Initial Impression / Assessment and Plan / ED Course  I have reviewed the triage vital signs and the nursing notes.  Pertinent labs & imaging results that were available during my care of the patient were reviewed by me and considered in my medical decision making (see chart for details).    Pt with severe chest pain after initial evaluation, diaphoretic.  Repeat EKG with nonspecific changes.  Provided nitroglycerin with minimal improvement in pain, fentanyl with mild improvement in pain.    Patient with ongoing and worsening pain despite multiple doses of medications.  CTA obtained to further evaluate.  CT is negative for acute dissection.  Cardiology consulted for  further recommendations.  Final Clinical Impressions(s) / ED Diagnoses   Final diagnoses:  None    New  Prescriptions New Prescriptions   No medications on file     Quintella Reichert, MD 05/25/17 1520

## 2017-05-25 NOTE — ED Notes (Signed)
Took patient to the bathroom patient is back in room hooked back up to the monitor family at bedside and call bell in reach

## 2017-05-25 NOTE — H&P (Signed)
H&P   Patient ID: [name]; [id number]; 05-10-1946   Admit date: 05/25/2017 Date of Consult: 05/25/2017  Primary Care Provider: [name], [name], MD Primary Cardiologist: Dr Harrington Challenger (previously Dr Ron Parker)   Patient Profile:   [name] is a 71 y.o. female with a hx of chest Blackwell and prior negative Myoview who is being seen today for the evaluation of chest Blackwell at the request of Dr Ralene Bathe.  History of Present Illness:   [name] is a 71 y.o. female with a hx of HTN, DM2, CKD, renal CA s/p R nephrectomy 2010, RA, and polyneuropathy followed by Dr Leta Baptist. She had chest Blackwell during a hospitalization for lumbar spine surgery c/b C diff colitis and AKI in 10/2014. She had minimally elevated Troponin levels then. An Echo then showed normal ejection fraction and mild diastolic dysfunction. OP myocardial perfusion imaging study June 2016 was normal.  She was seen back in the office in 3/18 for evaluation of chest Blackwell and shortness of breath. An echocardiogram was done 10/27/16 which demonstrated normal LV function and mild diastolic dysfunction with moderate LVH. Nuclear stress 10/27/16 testing was also obtained. This demonstrated a lateral defect that was reversible. It was reviewed by Dr. Harrington Challenger who felt that there was no significant ischemia identified.  The pt presented to the ED this am with complaints of chest Blackwell. Last night she developed central chest Blackwell described as a burning sensation.  She thought it was reflux and she took a Zantac at home and symptoms partially improved and she was able to sleep at night.  She says she was awakened with severe recurrent chest Blackwell 4 am.  This was associated with mild nausea as well as generalized mild abdominal discomfort.  She developed diaphoresis and felt very hot in route to the emergency department.  No fever, cough, vomiting, diarrhea, leg swelling or Blackwell.  She does endorse mild shortness of breath. Troponin is negative x 2 and Rose EKG shows no  acute changes. She had recurrent chest Blackwell that was unrelieved in the ED and radiated to Rose back and she was sent to the ED for CTA to r/o dissection which was negative for PE, or dissection. She had "mild" CAD noted on CT.     Past Medical History:  Diagnosis Date  . Allergy   . Arthritis    RA (Dr. Ouida Sills)  . C. difficile diarrhea    history of  . CKD (chronic kidney disease) stage 3, GFR 30-59 ml/min ([place]) 10/11/2016   s/p R nephrectomy  . Colon polyps 2008   HYPERPLASTIC  . Costochondritis    a. Nuc Stress Test 6/16: EF 70, no scar or ischemia, low risk // b. Echo 3/16: mild conc LVH, EF 65-70, no RWMA, Gr 1 DD, mild TR  . Diabetes mellitus without complication ([facility])    type 2  . Gastritis   . GERD (gastroesophageal reflux disease)   . History of echocardiogram    Echo 3/18:  Moderate LVH, EF 82-50, grade 1 diastolic dysfunction, calcified aortic valve, mild MR, moderate LAE  . History of nuclear stress test    Myoview 3/18: Mod size and intensity fixed septal defect, may be artifact.Opposite mod size and intensity lat defect, which is reversible and could represent ischemia or possibly artifact (SDS 4). LVEF 71% with normal wall motion. Intermediate risk study. >> images reviewed with Dr. Dorris Carnes - no sig ischemia; med rx   . History of pneumonia   .  Hx of cardiovascular stress test    Lexiscan Myoview 6/16:  EF 70%, no scar or ischemia; Low Risk  . Hypertension   . Neuropathy   . Osteoarthritis   . Thyroid disease     Past Surgical History:  Procedure Laterality Date  . ABDOMINAL HYSTERECTOMY  1978  . BACK SURGERY     x 2  . CARPAL TUNNEL RELEASE Left   . COLONOSCOPY W/ BIOPSIES AND POLYPECTOMY     Hx: of  . HNP    . LUMBAR LAMINECTOMY/DECOMPRESSION MICRODISCECTOMY Left 02/12/2013   Procedure: LUMBAR TWO THREE, LUMBAR THREE FOUR, LUMBAR FOUR FIVE  LAMINECTOMY/DECOMPRESSION MICRODISCECTOMY 3 LEVELS;  Surgeon: Charlie Pitter, MD;  Location: [facility];  Service:  Neurosurgery;  Laterality: Left;  . NEPHRECTOMY Right 2010   10.rcc cancer  . TOTAL KNEE ARTHROPLASTY Right    Redo     Home Medications:  Prior to Admission medications   Medication Sig Start Date End Date Taking? Authorizing Provider  acetaminophen (TYLENOL) 500 MG tablet Take 500 mg by mouth 2 (two) times daily as needed (Blackwell).    [provider]  amLODipine (NORVASC) 2.5 MG tablet Take 2 tablets (5 mg total) by mouth daily. 03/10/17   [name], [name], MD  cyclobenzaprine (FLEXERIL) 5 MG tablet Take 1 tablet (5 mg total) by mouth 3 (three) times daily as needed for muscle spasms. 06/08/16   Dorena Cookey, MD  fluticasone (FLONASE) 50 MCG/ACT nasal spray Place 1 spray into both nostrils 2 (two) times daily as needed for allergies. 06/08/16   Dorena Cookey, MD  gabapentin (NEURONTIN) 300 MG capsule Take 1 capsule (300 mg total) by mouth at bedtime. 03/16/17   Penumalli, Earlean Polka, MD  losartan-hydrochlorothiazide (HYZAAR) 100-12.5 MG tablet Take 1 tablet by mouth daily. 06/08/16   Dorena Cookey, MD  Multiple Vitamin (MULTIVITAMIN) tablet Take 1 tablet by mouth daily.    [provider]  NONFORMULARY OR COMPOUNDED ITEM Rx for compounded cream faxed to [facility]    [provider]  Olopatadine HCl (PAZEO) 0.7 % SOLN Apply 1 drop to eye daily. 11/01/16   [name], [name], MD  [facility] VERIO test strip USE ONCE DAILY FOR CHECK GLUCOSE LEVELS. DX E11.9 07/12/16   Dorena Cookey, MD  polyethylene glycol powder (GLYCOLAX/MIRALAX) powder Take 17 g by mouth daily. 05/03/17   Levin Erp, PA  ranitidine (ZANTAC) 150 MG tablet Take 1 tablet (150 mg total) by mouth 2 (two) times daily. 11/01/16   [name], [name], MD    Inpatient Medications: Scheduled Meds:  Continuous Infusions:  PRN Meds: nitroGLYCERIN  Allergies:    Allergies  Allergen Reactions  . Percocet [Oxycodone-Acetaminophen] Hives, Itching and Other (See Comments)    hallucinations  .  Pravachol [Pravastatin Sodium] Other (See Comments)    Muscle aches  . Hydrocodone Other (See Comments)    "crazy dreams"  . Aspirin Other (See Comments)    REACTION: GI upset    Social History:   Social History   Social History  . Marital status: Divorced    Spouse name: N/A  . Number of children: 3  . Years of education: 12   Occupational History  . Retired Retired   Social History Main Topics  . Smoking status: Current Every Day Smoker    Years: 15.00    Types: Cigarettes  . Smokeless tobacco: Never Used     Comment: 03/16/17  1 pk/week  . Alcohol use No  .  Drug use: No  . Sexual activity: Not on file   Other Topics Concern  . Not on file   Social History Narrative   Single, dgtr lives with Rose   Never Smoked    Alcohol use- no   Drug use-no   Regular Exercise-yes   Former Smoker- 12/2008    Family History:    Family History  Problem Relation Age of Onset  . Rheum arthritis Mother   . Stroke Mother   . Prostate cancer Father   . Heart disease Father   . Diabetes Brother   . Dementia Brother   . Parkinsonism Brother   . Kidney disease Son   . Diabetes Brother   . Cancer Brother   . Dementia Brother   . Colon cancer Neg Hx   . Esophageal cancer Neg Hx   . Pancreatic cancer Neg Hx   . Liver disease Neg Hx      ROS:  Please see the history of present illness.  ROS  All other ROS reviewed and negative.     Physical Exam/Data:   Vitals:   05/25/17 1015 05/25/17 1030 05/25/17 1045 05/25/17 1100  BP: (!) 142/81 (!) 159/82 (!) 164/77 (!) 165/82  Pulse: 68 61 69 70  Resp: 18 14 19  (!) 27  Temp:      TempSrc:      SpO2: 100% 100% 100% 100%  Weight:      Height:        Intake/Output Summary (Last 24 hours) at 05/25/17 1155 Last data filed at 05/25/17 1127  Gross per 24 hour  Intake             1000 ml  Output                0 ml  Net             1000 ml   Filed Weights   05/25/17 0601  Weight: 200 lb (90.7 kg)   Body mass index is 33.28  kg/m.  General:  Obese AA female in no acute distress complaining of mid sternal chest Blackwell HEENT: normal Lymph: no adenopathy Neck: no JVD Endocrine:  No thryomegaly Vascular: No carotid bruits; FA pulses 2+ bilaterally without bruits  Cardiac:  normal S1, S2; RRR; no murmur. Chest is tender to palpation Lungs:  clear to auscultation bilaterally, no wheezing, rhonchi or rales  Abd: obese soft, nontender, no hepatomegaly  Ext: no edema Musculoskeletal:  No deformities, BUE and BLE strength normal and equal Skin: warm and dry  Neuro:  CNs 2-12 intact, no focal abnormalities noted Psych:  Normal affect   EKG:  The EKG was personally reviewed and demonstrates:  NSR poor anterior RW  Relevant CV Studies:   Laboratory Data:  Chemistry Recent Labs Lab 05/25/17 0602  NA 139  K 3.5  CL 105  CO2 26  GLUCOSE 97  BUN 16  CREATININE 1.14*  CALCIUM 9.9  GFRNONAA 47*  GFRAA 54*  ANIONGAP 8     Recent Labs Lab 05/25/17 0602  PROT 7.8  ALBUMIN 3.7  AST 25  ALT 17  ALKPHOS 76  BILITOT 0.5   Hematology Recent Labs Lab 05/25/17 0602  WBC 8.1  RBC 4.63  HGB 13.8  HCT 40.9  MCV 88.3  MCH 29.8  MCHC 33.7  RDW 15.5  PLT 268   Cardiac Enzymes Recent Labs Lab 05/25/17 1028  TROPONINI <0.03    Recent Labs Lab 05/25/17 0609  TROPIPOC 0.00  BNPNo results for input(s): BNP, PROBNP in the last 168 hours.  DDimer No results for input(s): DDIMER in the last 168 hours.  Radiology/Studies:  Dg Chest 2 View  Result Date: 05/25/2017 CLINICAL DATA:  Chest Blackwell and shortness of breath, cough for 1 day. History of hypertension, diabetes, smoker. EXAM: CHEST  2 VIEW COMPARISON:  Chest radiograph October 01, 2016 FINDINGS: The cardiac silhouette is mildly enlarged and unchanged. Mediastinal silhouette is nonsuspicious. Bibasilar strandy densities, similar to increased from prior examination. Bronchitic changes. No pleural effusion. No pneumothorax. Mild degenerative change of  the thoracic spine. IMPRESSION: Worsening bibasilar atelectasis/scarring. Mild bronchitic changes. Mild cardiomegaly. Electronically Signed   By: Elon Alas M.D.   On: 05/25/2017 06:28    Assessment and Plan:   Chest Blackwell- R/o cardiac.  HTN- Controlled, B/P dropped in ED after SL NTG  NIDDM- Diet controlled  Dyslipidemia- LDL 172! Aug 2018-apparently statin intolerant   CRI- S/p prior nephrectomy, CRI 2-3  Polyneuropathy and history of Rheumatoid Arthritis- Per chart records  Plan-  The pt says this Blackwell is unlike any chest Blackwell she has had previously. So far Troponin negative. She will most likely require diagnostic cath tomorrow to r/o significant CAD as a cause of Rose symptoms. Would not start Heparin unless Troponin turns positive. Hydrate overnight. Check echo for pericardial effusion/ pericarditis.   Discussed with Dr Ellyn Hack- will get coronary CT tomorrow, treat with one dose of steroids tonight for non cardiac chest Blackwell, unable to use NSAID secondary to CRI and pt has reported intolerance to codeine and tylenol.   For questions or updates, please contact [place] Please consult [email or web address] for contact info under Cardiology/STEMI.   Signed, Kerin Ransom, PA-C  05/25/2017 11:55 AM  I personally saw and evaluated the patient this morning along with Kerin Ransom, PA-C.  We reviewed the chart together we discussed the plan. [name] seems very musculoskeletal in nature. It is reproducible on exam and has been long-standing. It is worse with deep inspiration. I suspect this is costochondritis. However she has significant cardiac risk factors and therefore cannot be totally confident with that we are not dealing with CAD.   I think the best option at this time would be to allow Rose to recover from the CT angiogram of the chest today. We will gently hydrate Rose tonight. I would consider coronary CT angiogram tomorrow. Search and Blackwell seems to be  musculoskeletal, I would like to treat with NSAIDs, however in light of Rose just receiving contrast for CT and again tomorrow, would prefer to hold off on NSAIDs. We will give Rose dose of steroids today. She does have polyneuropathy of rheumatoid arthritis which could very well be the etiology for Rose symptoms.   I'm choosing a CT angiogram as opposed to another nuclear stress test, for the last stress test was equivocal. I am not convinced that this is cardiac, and therefore would prefer not to do an invasive ischemic evaluation with cardiac catheterization.    Glenetta Hew, M.D., M.S. Interventional Cardiologist   Pager # [phone] Phone # [phone] [address] [place], [place] [address]

## 2017-05-25 NOTE — ED Notes (Signed)
Patient transported to CT 

## 2017-05-26 ENCOUNTER — Other Ambulatory Visit: Payer: Self-pay

## 2017-05-26 ENCOUNTER — Observation Stay (HOSPITAL_COMMUNITY): Payer: Medicare HMO

## 2017-05-26 ENCOUNTER — Ambulatory Visit (HOSPITAL_COMMUNITY): Payer: Medicare HMO

## 2017-05-26 DIAGNOSIS — E669 Obesity, unspecified: Secondary | ICD-10-CM | POA: Diagnosis not present

## 2017-05-26 DIAGNOSIS — E1142 Type 2 diabetes mellitus with diabetic polyneuropathy: Secondary | ICD-10-CM | POA: Diagnosis not present

## 2017-05-26 DIAGNOSIS — R079 Chest pain, unspecified: Secondary | ICD-10-CM | POA: Diagnosis not present

## 2017-05-26 DIAGNOSIS — E785 Hyperlipidemia, unspecified: Secondary | ICD-10-CM | POA: Diagnosis not present

## 2017-05-26 DIAGNOSIS — Z23 Encounter for immunization: Secondary | ICD-10-CM | POA: Diagnosis not present

## 2017-05-26 DIAGNOSIS — I358 Other nonrheumatic aortic valve disorders: Secondary | ICD-10-CM | POA: Diagnosis not present

## 2017-05-26 DIAGNOSIS — E1122 Type 2 diabetes mellitus with diabetic chronic kidney disease: Secondary | ICD-10-CM | POA: Diagnosis not present

## 2017-05-26 DIAGNOSIS — I129 Hypertensive chronic kidney disease with stage 1 through stage 4 chronic kidney disease, or unspecified chronic kidney disease: Secondary | ICD-10-CM | POA: Diagnosis not present

## 2017-05-26 DIAGNOSIS — I2582 Chronic total occlusion of coronary artery: Secondary | ICD-10-CM | POA: Diagnosis not present

## 2017-05-26 DIAGNOSIS — I25110 Atherosclerotic heart disease of native coronary artery with unstable angina pectoris: Secondary | ICD-10-CM | POA: Diagnosis not present

## 2017-05-26 DIAGNOSIS — I951 Orthostatic hypotension: Secondary | ICD-10-CM | POA: Diagnosis not present

## 2017-05-26 DIAGNOSIS — G629 Polyneuropathy, unspecified: Secondary | ICD-10-CM | POA: Diagnosis not present

## 2017-05-26 LAB — BASIC METABOLIC PANEL
Anion gap: 11 (ref 5–15)
BUN: 11 mg/dL (ref 6–20)
CO2: 22 mmol/L (ref 22–32)
Calcium: 9.4 mg/dL (ref 8.9–10.3)
Chloride: 99 mmol/L — ABNORMAL LOW (ref 101–111)
Creatinine, Ser: 0.91 mg/dL (ref 0.44–1.00)
GFR calc Af Amer: >60 mL/min (ref >60)
GFR calc non Af Amer: >60 mL/min (ref >60)
Glucose, Bld: 152 mg/dL — ABNORMAL HIGH (ref 65–99)
Potassium: 3.5 mmol/L (ref 3.5–5.1)
Sodium: 132 mmol/L — ABNORMAL LOW (ref 135–145)

## 2017-05-26 LAB — GLUCOSE, CAPILLARY
Glucose-Capillary: 138 mg/dL — ABNORMAL HIGH (ref 65–99)
Glucose-Capillary: 96 mg/dL (ref 65–99)
Glucose-Capillary: 95 mg/dL (ref 65–99)
Glucose-Capillary: 115 mg/dL — ABNORMAL HIGH (ref 65–99)

## 2017-05-26 LAB — PROTIME-INR
INR: 1
Prothrombin Time: 13.1 seconds (ref 11.4–15.2)

## 2017-05-26 MED ORDER — IOPAMIDOL (ISOVUE-370) INJECTION 76%
INTRAVENOUS | Status: AC
  Administered 2017-05-26: 100 mL
  Filled 2017-05-26: qty 100

## 2017-05-26 MED ORDER — NITROGLYCERIN 0.4 MG SL SUBL
SUBLINGUAL_TABLET | SUBLINGUAL | Status: AC
  Filled 2017-05-26: qty 2

## 2017-05-26 MED ORDER — SODIUM CHLORIDE 0.9% FLUSH
3.0000 mL | Freq: Two times a day (BID) | INTRAVENOUS | Status: DC
  Administered 2017-05-26: 3 mL via INTRAVENOUS

## 2017-05-26 MED ORDER — ASPIRIN 81 MG PO CHEW: 81.0000 mg | CHEWABLE_TABLET | ORAL | Status: DC

## 2017-05-26 MED ORDER — SODIUM CHLORIDE 0.9% FLUSH: 3.0000 mL | INTRAVENOUS | Status: DC | PRN

## 2017-05-26 MED ORDER — SODIUM CHLORIDE 0.9 % IV SOLN: 250.0000 mL | INTRAVENOUS | Status: DC | PRN

## 2017-05-26 MED ORDER — POLYETHYLENE GLYCOL 3350 17 G PO PACK
17.0000 g | PACK | Freq: Every day | ORAL | Status: DC
  Administered 2017-05-26 – 2017-05-28 (×2): 17 g via ORAL
  Filled 2017-05-26 (×3): qty 1

## 2017-05-26 MED ORDER — AMLODIPINE BESYLATE 10 MG PO TABS
10.0000 mg | ORAL_TABLET | Freq: Every day | ORAL | Status: DC
  Administered 2017-05-26 – 2017-05-27 (×2): 10 mg via ORAL
  Filled 2017-05-26 (×2): qty 1

## 2017-05-26 MED ORDER — SODIUM CHLORIDE 0.9 % IV SOLN
INTRAVENOUS | Status: DC
  Administered 2017-05-26 – 2017-05-27 (×2): via INTRAVENOUS

## 2017-05-26 MED ORDER — METOPROLOL TARTRATE 5 MG/5ML IV SOLN
INTRAVENOUS | Status: AC
  Filled 2017-05-26: qty 10

## 2017-05-26 NOTE — Progress Notes (Signed)
3 beats VT noted at 1215 by CMT, RN notified. Strip saved to chart.

## 2017-05-26 NOTE — Care Management Note (Signed)
Case Management Note  Patient Details  Name: [name] MRN: [id number] Date of Birth: November 27, 1945  Subjective/Objective:     Pt presented for CP.  Awaiting Cardiac CT.  Pt's daughter at [facility].  Expects D/c today pending results of CT.    Pt from home. Pt lives with daughter and has walker, cane, and BSC. Pt states has all equipment she needs and does not anticipate any needs upon d/c.  Pt also has [employer] supplement to [employer].         Action/Plan: CM will continue to follow for d/c needs.  Expected Discharge Date:                  Expected Discharge Plan:  Home/Self Care  In-House Referral:  NA  Discharge planning Services  NA  Post Acute Care Choice:  NA Choice offered to:  NA  DME Arranged:  N/A DME Agency:  NA  HH Arranged:  NA HH Agency:  NA  Status of Service:  In process, will continue to follow  If discussed at Long Length of Stay Meetings, dates discussed:    Additional Comments:  Arley Phenix, RN 05/26/2017, 2:44 PM

## 2017-05-26 NOTE — Progress Notes (Signed)
Progress Note  Patient Name: [name] Date of Encounter: 05/26/2017  Primary Cardiologist: Harrington Challenger  Subjective   Still with some discomfort  Better than yesterday  Breathing is OK    Inpatient Medications    Scheduled Meds: . amLODipine  5 mg Oral Daily  . enoxaparin (LOVENOX) injection  40 mg Subcutaneous Q24H  . famotidine  20 mg Oral BID  . gabapentin  300 mg Oral QHS  . insulin aspart  0-15 Units Subcutaneous TID WC  . insulin aspart  0-5 Units Subcutaneous QHS  . multivitamin with minerals  1 tablet Oral Daily  . pneumococcal 23 valent vaccine  0.5 mL Intramuscular Tomorrow-1000  . polyethylene glycol powder  17 g Oral Daily   Continuous Infusions: . sodium chloride 75 mL/hr at 05/25/17 1725   PRN Meds: acetaminophen, ALPRAZolam, cyclobenzaprine, fluticasone, nitroGLYCERIN, ondansetron (ZOFRAN) IV, zolpidem   Vital Signs    Vitals:   05/25/17 1615 05/25/17 1630 05/25/17 2230 05/26/17 0556  BP: (!) 173/83 (!) 178/96 (!) 163/78 (!) 170/81  Pulse: 73 75 80 74  Resp: 15 (!) 21 (!) 21 (!) 21  Temp:   98.7 F (37.1 C) 98.3 F (36.8 C)  TempSrc:   Oral Oral  SpO2: 100% 100% 100% 100%  Weight:    212 lb 6.4 oz (96.3 kg)  Height:        Intake/Output Summary (Last 24 hours) at 05/26/17 0807 Last data filed at 05/25/17 2018  Gross per 24 hour  Intake             1000 ml  Output              900 ml  Net              100 ml   Filed Weights   05/25/17 0601 05/26/17 0556  Weight: 200 lb (90.7 kg) 212 lb 6.4 oz (96.3 kg)    Telemetry    SR - Personally Reviewed  ECG      Physical Exam   GEN: No acute distress.   Neck: No JVD Cardiac: RRR, no murmurs, rubs, or gallops.  Chest  Tender   Respiratory: Clear to auscultation bilaterally. GI: Soft, nontender, non-distended  MS: No edema; No deformity. Neuro:  Nonfocal  Psych: Normal affect   Labs    Chemistry Recent Labs Lab 05/25/17 0602 05/26/17 0418  NA 139 132*  K 3.5 3.5  CL 105 99*  CO2  26 22  GLUCOSE 97 152*  BUN 16 11  CREATININE 1.14* 0.91  CALCIUM 9.9 9.4  PROT 7.8  --   ALBUMIN 3.7  --   AST 25  --   ALT 17  --   ALKPHOS 76  --   BILITOT 0.5  --   GFRNONAA 47* >60  GFRAA 54* >60  ANIONGAP 8 11     Hematology Recent Labs Lab 05/25/17 0602  WBC 8.1  RBC 4.63  HGB 13.8  HCT 40.9  MCV 88.3  MCH 29.8  MCHC 33.7  RDW 15.5  PLT 268    Cardiac Enzymes Recent Labs Lab 05/25/17 1028  TROPONINI <0.03    Recent Labs Lab 05/25/17 0609  TROPIPOC 0.00     BNPNo results for input(s): BNP, PROBNP in the last 168 hours.   DDimer No results for input(s): DDIMER in the last 168 hours.   Radiology    Dg Chest 2 View  Result Date: 05/25/2017 CLINICAL DATA:  Chest pain and shortness  of breath, cough for 1 day. History of hypertension, diabetes, smoker. EXAM: CHEST  2 VIEW COMPARISON:  Chest radiograph October 01, 2016 FINDINGS: The cardiac silhouette is mildly enlarged and unchanged. Mediastinal silhouette is nonsuspicious. Bibasilar strandy densities, similar to increased from prior examination. Bronchitic changes. No pleural effusion. No pneumothorax. Mild degenerative change of the thoracic spine. IMPRESSION: Worsening bibasilar atelectasis/scarring. Mild bronchitic changes. Mild cardiomegaly. Electronically Signed   By: Elon Alas M.D.   On: 05/25/2017 06:28   Ct Angio Chest/abd/pel For Dissection W And/or W/wo  Result Date: 05/25/2017 CLINICAL DATA:  Severe midline upper chest pain radiating into the back since yesterday. Evaluate for pulmonary embolism. EXAM: CT ANGIOGRAPHY CHEST, ABDOMEN AND PELVIS TECHNIQUE: Multidetector CT imaging through the chest, abdomen and pelvis was performed using the standard protocol during bolus administration of intravenous contrast. Multiplanar reconstructed images and MIPs were obtained and reviewed to evaluate the vascular anatomy. CONTRAST:  100 ml Isovue-300 COMPARISON:  Chest radiographs 05/25/2017.  Abdominopelvic CT 02/24/2010 and 02/25/2011. FINDINGS: CTA CHEST FINDINGS Cardiovascular: Pre contrast images demonstrate aortic and coronary artery atherosclerosis. There are no displaced intimal calcifications. Post-contrast, there is adequate opacification of the pulmonary arteries. There is no evidence of acute pulmonary embolus. The aorta and great vessels opacify normally. The brachiocephalic and left common carotid artery share a common origin from the aortic arch. There is no evidence of aneurysm or dissection. The heart size is normal. There is no pericardial effusion. Mediastinum/Nodes: There are no enlarged mediastinal, hilar or axillary lymph nodes. Prominent prevascular soft tissue is unchanged from previous CTs and although atypical for age most likely represents thymic tissue. The trachea, esophagus and thyroid gland demonstrate no significant findings. Lungs/Pleura: There is no pleural effusion or pneumothorax. New patchy ground-glass opacities at both lung bases appear largely linear on the reformatted images and are most consistent with subsegmental atelectasis. There is no confluent airspace opacity. Simple lipoma noted laterally in the right third intercostal space. Musculoskeletal: No chest wall mass or suspicious osseous findings. Review of the MIP images confirms the above findings. CTA ABDOMEN AND PELVIS FINDINGS VASCULAR Aorta: Mild atherosclerosis.  No aneurysm or dissection. Celiac: Conventional anatomy.  Widely patent. SMA: Widely patent. Renals: Status post right nephrectomy. The left renal artery is duplicated but patent. IMA: Widely patent. Inflow: Widely patent. Veins: Suboptimally assessed due to timing. The left renal vein and superior aspects of the IVC appear normal. Review of the MIP images confirms the above findings. NON-VASCULAR Hepatobiliary: The liver appears normal without focal abnormality. There are small dependent gallstones in the gallbladder lumen. No gallbladder wall  thickening or biliary dilatation. Pancreas: Normal appearance. No evidence of pancreatic mass, ductal dilatation or surrounding inflammation. Spleen: Normal in size without focal abnormality. Adrenals/Urinary Tract: The right adrenal gland appears normal. There is a stable 2.6 cm left adrenal nodule consistent with an adenoma based on stability. The right nephrectomy bed appears stable. The left kidney, left ureter and bladder appear unremarkable. Stomach/Bowel: No enteric contrast was administered. The stomach is poorly distended and suboptimally evaluated. No bowel wall thickening, distention or surrounding inflammation. Lymphatic: No enlarged abdominopelvic lymph nodes. Reproductive: Hysterectomy. No evidence of adnexal mass. Suspected pelvic floor laxity. Other: No ascites or peritoneal nodularity. Musculoskeletal: Lower lumbar spondylosis status post L4-5 fusion. No acute or worrisome osseous findings. Review of the MIP images confirms the above findings. IMPRESSION: 1. No evidence of acute pulmonary embolism or other acute vascular findings. 2. Mild aortic and coronary artery atherosclerosis. 3. Stable soft tissue  nodularity in the anterior mediastinum consistent with thymic tissue. 4. Linear atelectasis at both lung bases.  No consolidation. 5. Status post right nephrectomy without evidence of local recurrence or metastatic disease. Stable left adrenal adenoma. 6. Cholelithiasis. 7. Limited bowel evaluation.  The stomach is poorly distended. Electronically Signed   By: Richardean Sale M.D.   On: 05/25/2017 12:19    Cardiac Studies     Patient Profile     71 y.o. female with historyo fo HTN and CP  NEg myovue earlier this year  Also history of HTN, DM, CKD  (1 kidney)  RA   Admitted yesterday with CP    Assessment & Plan    1  CP  There is a portion of pain that is definitely musculoskeletal  Then there is also something more vague.  She had a CT yesteday that was neg for PE  DId show  atherosclerosis of coronary arteries.  She r/o for MI   Given this She was set up for CT angio of heart  THis is to be done today .  2  HTN  BP high this AM  WIll increase amlodipine today  3  Renal s/p nephrectomy   Cr 0.91  Hydrating now.    4  HL  Statin intolerant  WIll need to check on Repatha as outpt  5  DM   Diet controlled     For questions or updates, please contact [place] Please consult [email or web address] for contact info under Cardiology/STEMI.      Signed, Dorris Carnes, MD  05/26/2017, 8:07 AM

## 2017-05-26 NOTE — Care Management Obs Status (Signed)
[facility] NOTIFICATION   Patient Details  Name: SHACORIA LATIF MRN: [id number] Date of Birth: [birth year]   Medicare Observation Status Notification Given:       Arley Phenix, RN 05/26/2017, 2:22 PM

## 2017-05-27 ENCOUNTER — Observation Stay (HOSPITAL_BASED_OUTPATIENT_CLINIC_OR_DEPARTMENT_OTHER): Payer: Medicare HMO

## 2017-05-27 ENCOUNTER — Inpatient Hospital Stay (HOSPITAL_COMMUNITY)
Admission: EM | Disposition: A | Discharge status: Home / Self Care | Payer: Self-pay | Source: Home / Self Care | Attending: Internal Medicine

## 2017-05-27 ENCOUNTER — Other Ambulatory Visit: Payer: Self-pay

## 2017-05-27 DIAGNOSIS — I25110 Atherosclerotic heart disease of native coronary artery with unstable angina pectoris: Secondary | ICD-10-CM | POA: Diagnosis not present

## 2017-05-27 DIAGNOSIS — I10 Essential (primary) hypertension: Secondary | ICD-10-CM | POA: Diagnosis not present

## 2017-05-27 DIAGNOSIS — I129 Hypertensive chronic kidney disease with stage 1 through stage 4 chronic kidney disease, or unspecified chronic kidney disease: Secondary | ICD-10-CM | POA: Diagnosis not present

## 2017-05-27 DIAGNOSIS — N183 Chronic kidney disease, stage 3 (moderate): Secondary | ICD-10-CM | POA: Diagnosis not present

## 2017-05-27 DIAGNOSIS — I950 Idiopathic hypotension: Secondary | ICD-10-CM | POA: Diagnosis not present

## 2017-05-27 DIAGNOSIS — Z905 Acquired absence of kidney: Secondary | ICD-10-CM | POA: Diagnosis not present

## 2017-05-27 DIAGNOSIS — Z885 Allergy status to narcotic agent status: Secondary | ICD-10-CM | POA: Diagnosis not present

## 2017-05-27 DIAGNOSIS — Z6835 Body mass index (BMI) 35.0-35.9, adult: Secondary | ICD-10-CM | POA: Diagnosis not present

## 2017-05-27 DIAGNOSIS — E669 Obesity, unspecified: Secondary | ICD-10-CM | POA: Diagnosis not present

## 2017-05-27 DIAGNOSIS — Z79899 Other long term (current) drug therapy: Secondary | ICD-10-CM | POA: Diagnosis not present

## 2017-05-27 DIAGNOSIS — G629 Polyneuropathy, unspecified: Secondary | ICD-10-CM | POA: Diagnosis not present

## 2017-05-27 DIAGNOSIS — I2582 Chronic total occlusion of coronary artery: Secondary | ICD-10-CM | POA: Diagnosis not present

## 2017-05-27 DIAGNOSIS — I2510 Atherosclerotic heart disease of native coronary artery without angina pectoris: Secondary | ICD-10-CM | POA: Diagnosis present

## 2017-05-27 DIAGNOSIS — E785 Hyperlipidemia, unspecified: Secondary | ICD-10-CM | POA: Diagnosis not present

## 2017-05-27 DIAGNOSIS — I951 Orthostatic hypotension: Secondary | ICD-10-CM | POA: Diagnosis not present

## 2017-05-27 DIAGNOSIS — Z85528 Personal history of other malignant neoplasm of kidney: Secondary | ICD-10-CM | POA: Diagnosis not present

## 2017-05-27 DIAGNOSIS — Z886 Allergy status to analgesic agent status: Secondary | ICD-10-CM | POA: Diagnosis not present

## 2017-05-27 DIAGNOSIS — Z888 Allergy status to other drugs, medicaments and biological substances status: Secondary | ICD-10-CM | POA: Diagnosis not present

## 2017-05-27 DIAGNOSIS — Z23 Encounter for immunization: Secondary | ICD-10-CM | POA: Diagnosis not present

## 2017-05-27 DIAGNOSIS — M1990 Unspecified osteoarthritis, unspecified site: Secondary | ICD-10-CM | POA: Diagnosis present

## 2017-05-27 DIAGNOSIS — I358 Other nonrheumatic aortic valve disorders: Secondary | ICD-10-CM | POA: Diagnosis not present

## 2017-05-27 DIAGNOSIS — R079 Chest pain, unspecified: Secondary | ICD-10-CM

## 2017-05-27 DIAGNOSIS — M069 Rheumatoid arthritis, unspecified: Secondary | ICD-10-CM | POA: Diagnosis present

## 2017-05-27 DIAGNOSIS — E1142 Type 2 diabetes mellitus with diabetic polyneuropathy: Secondary | ICD-10-CM | POA: Diagnosis not present

## 2017-05-27 DIAGNOSIS — K219 Gastro-esophageal reflux disease without esophagitis: Secondary | ICD-10-CM | POA: Diagnosis present

## 2017-05-27 DIAGNOSIS — Z981 Arthrodesis status: Secondary | ICD-10-CM | POA: Diagnosis not present

## 2017-05-27 DIAGNOSIS — E1122 Type 2 diabetes mellitus with diabetic chronic kidney disease: Secondary | ICD-10-CM | POA: Diagnosis not present

## 2017-05-27 DIAGNOSIS — Z96651 Presence of right artificial knee joint: Secondary | ICD-10-CM | POA: Diagnosis present

## 2017-05-27 DIAGNOSIS — Z87891 Personal history of nicotine dependence: Secondary | ICD-10-CM | POA: Diagnosis not present

## 2017-05-27 HISTORY — PX: LEFT HEART CATH AND CORONARY ANGIOGRAPHY: CATH118249

## 2017-05-27 HISTORY — PX: CORONARY STENT INTERVENTION: CATH118234

## 2017-05-27 LAB — BASIC METABOLIC PANEL
Anion gap: 11 (ref 5–15)
BUN: 9 mg/dL (ref 6–20)
CO2: 20 mmol/L — ABNORMAL LOW (ref 22–32)
Calcium: 9.3 mg/dL (ref 8.9–10.3)
Chloride: 102 mmol/L (ref 101–111)
Creatinine, Ser: 1.16 mg/dL — ABNORMAL HIGH (ref 0.44–1.00)
GFR calc Af Amer: 53 mL/min — ABNORMAL LOW (ref >60)
GFR calc non Af Amer: 46 mL/min — ABNORMAL LOW (ref >60)
Glucose, Bld: 115 mg/dL — ABNORMAL HIGH (ref 65–99)
Potassium: 4 mmol/L (ref 3.5–5.1)
Sodium: 133 mmol/L — ABNORMAL LOW (ref 135–145)

## 2017-05-27 LAB — GLUCOSE, CAPILLARY
Glucose-Capillary: 118 mg/dL — ABNORMAL HIGH (ref 65–99)
Glucose-Capillary: 103 mg/dL — ABNORMAL HIGH (ref 65–99)
Glucose-Capillary: 115 mg/dL — ABNORMAL HIGH (ref 65–99)
Glucose-Capillary: 106 mg/dL — ABNORMAL HIGH (ref 65–99)

## 2017-05-27 LAB — ECHOCARDIOGRAM COMPLETE
Height: 65 in
Weight: 3364.8 oz

## 2017-05-27 LAB — POCT ACTIVATED CLOTTING TIME: Activated Clotting Time: 323 seconds

## 2017-05-27 SURGERY — LEFT HEART CATH AND CORONARY ANGIOGRAPHY
Anesthesia: LOCAL

## 2017-05-27 MED ORDER — ASPIRIN 325 MG PO TABS
ORAL_TABLET | ORAL | Status: AC
  Filled 2017-05-27: qty 1

## 2017-05-27 MED ORDER — SODIUM CHLORIDE 0.9 % IV SOLN: 250.0000 mL | INTRAVENOUS | Status: DC | PRN

## 2017-05-27 MED ORDER — HEPARIN (PORCINE) IN NACL 2-0.9 UNIT/ML-% IJ SOLN
INTRAMUSCULAR | Status: AC | PRN
  Administered 2017-05-27: 1000 mL via INTRA_ARTERIAL

## 2017-05-27 MED ORDER — MIDAZOLAM HCL 2 MG/2ML IJ SOLN
INTRAMUSCULAR | Status: DC | PRN
  Administered 2017-05-27 (×2): 1 mg via INTRAVENOUS

## 2017-05-27 MED ORDER — TICAGRELOR 90 MG PO TABS
90.0000 mg | ORAL_TABLET | Freq: Two times a day (BID) | ORAL | Status: DC
  Administered 2017-05-28 – 2017-05-29 (×4): 90 mg via ORAL
  Filled 2017-05-27 (×4): qty 1

## 2017-05-27 MED ORDER — SODIUM CHLORIDE 0.9% FLUSH
3.0000 mL | Freq: Two times a day (BID) | INTRAVENOUS | Status: DC
  Administered 2017-05-27 – 2017-05-28 (×2): 3 mL via INTRAVENOUS

## 2017-05-27 MED ORDER — FENTANYL CITRATE (PF) 100 MCG/2ML IJ SOLN
INTRAMUSCULAR | Status: AC
  Filled 2017-05-27: qty 2

## 2017-05-27 MED ORDER — LIDOCAINE HCL 2 % IJ SOLN
INTRAMUSCULAR | Status: AC
  Filled 2017-05-27: qty 20

## 2017-05-27 MED ORDER — IOPAMIDOL (ISOVUE-370) INJECTION 76%
INTRAVENOUS | Status: DC | PRN
  Administered 2017-05-27: 160 mL via INTRA_ARTERIAL

## 2017-05-27 MED ORDER — SODIUM CHLORIDE 0.9% FLUSH: 3.0000 mL | INTRAVENOUS | Status: DC | PRN

## 2017-05-27 MED ORDER — SODIUM CHLORIDE 0.9 % IV SOLN
INTRAVENOUS | Status: AC
  Administered 2017-05-27: 18:00:00 via INTRAVENOUS

## 2017-05-27 MED ORDER — FENTANYL CITRATE (PF) 100 MCG/2ML IJ SOLN
INTRAMUSCULAR | Status: DC | PRN
  Administered 2017-05-27: 25 ug via INTRAVENOUS

## 2017-05-27 MED ORDER — TICAGRELOR 90 MG PO TABS
ORAL_TABLET | ORAL | Status: DC | PRN
  Administered 2017-05-27: 180 mg via ORAL

## 2017-05-27 MED ORDER — LABETALOL HCL 5 MG/ML IV SOLN: 10.0000 mg | INTRAVENOUS | Status: AC | PRN

## 2017-05-27 MED ORDER — ASPIRIN 81 MG PO CHEW
CHEWABLE_TABLET | ORAL | Status: DC | PRN
Start: 2017-05-27 — End: 2017-05-27
  Administered 2017-05-27: 324 mg via ORAL

## 2017-05-27 MED ORDER — IOPAMIDOL (ISOVUE-370) INJECTION 76%
INTRAVENOUS | Status: AC
  Filled 2017-05-27: qty 100

## 2017-05-27 MED ORDER — MIDAZOLAM HCL 2 MG/2ML IJ SOLN
INTRAMUSCULAR | Status: AC
  Filled 2017-05-27: qty 2

## 2017-05-27 MED ORDER — ASPIRIN 81 MG PO CHEW
81.0000 mg | CHEWABLE_TABLET | Freq: Every day | ORAL | Status: DC
  Administered 2017-05-28 – 2017-05-29 (×2): 81 mg via ORAL
  Filled 2017-05-27 (×2): qty 1

## 2017-05-27 MED ORDER — HEPARIN (PORCINE) IN NACL 2-0.9 UNIT/ML-% IJ SOLN
INTRAMUSCULAR | Status: DC | PRN
  Administered 2017-05-27: 16:00:00 via INTRA_ARTERIAL

## 2017-05-27 MED ORDER — NITROGLYCERIN 1 MG/10 ML FOR IR/CATH LAB
INTRA_ARTERIAL | Status: AC
  Filled 2017-05-27: qty 10

## 2017-05-27 MED ORDER — LIDOCAINE HCL (PF) 1 % IJ SOLN
INTRAMUSCULAR | Status: DC | PRN
  Administered 2017-05-27: 2 mL

## 2017-05-27 MED ORDER — VERAPAMIL HCL 2.5 MG/ML IV SOLN
INTRAVENOUS | Status: AC
  Filled 2017-05-27: qty 2

## 2017-05-27 MED ORDER — TICAGRELOR 90 MG PO TABS
ORAL_TABLET | ORAL | Status: AC
  Filled 2017-05-27: qty 2

## 2017-05-27 MED ORDER — HEPARIN SODIUM (PORCINE) 1000 UNIT/ML IJ SOLN
INTRAMUSCULAR | Status: AC
  Filled 2017-05-27: qty 1

## 2017-05-27 MED ORDER — ANGIOPLASTY BOOK
Freq: Once | Status: AC
  Administered 2017-05-27: 19:00:00 1
  Filled 2017-05-27: qty 1

## 2017-05-27 MED ORDER — HEPARIN SODIUM (PORCINE) 1000 UNIT/ML IJ SOLN
INTRAMUSCULAR | Status: DC | PRN
  Administered 2017-05-27 (×2): 5000 [IU] via INTRAVENOUS

## 2017-05-27 MED ORDER — HEPARIN (PORCINE) IN NACL 2-0.9 UNIT/ML-% IJ SOLN
INTRAMUSCULAR | Status: AC
  Filled 2017-05-27: qty 1000

## 2017-05-27 MED ORDER — HYDRALAZINE HCL 20 MG/ML IJ SOLN: 5.0000 mg | INTRAMUSCULAR | Status: AC | PRN

## 2017-05-27 SURGICAL SUPPLY — 20 items
BALLN SAPPHIRE 2.5X12 (BALLOONS) ×2
BALLN ~~LOC~~ EMERGE MR 3.25X20 (BALLOONS) ×2
BALLOON SAPPHIRE 2.5X12 (BALLOONS) ×1 IMPLANT
BALLOON ~~LOC~~ EMERGE MR 3.25X20 (BALLOONS) ×1 IMPLANT
CATH INFINITI 5 FR JL3.5 (CATHETERS) ×2 IMPLANT
CATH INFINITI 5FR ANG PIGTAIL (CATHETERS) ×2 IMPLANT
CATH INFINITI JR4 5F (CATHETERS) ×2 IMPLANT
CATH VISTA GUIDE 6FR XB3 (CATHETERS) ×2 IMPLANT
DEVICE RAD COMP TR BAND LRG (VASCULAR PRODUCTS) ×2 IMPLANT
GLIDESHEATH SLEND SS 6F .021 (SHEATH) ×2 IMPLANT
GUIDEWIRE INQWIRE 1.5J.035X260 (WIRE) ×1 IMPLANT
INQWIRE 1.5J .035X260CM (WIRE) ×2
KIT ENCORE 26 ADVANTAGE (KITS) ×2 IMPLANT
KIT HEART LEFT (KITS) ×2 IMPLANT
PACK CARDIAC CATHETERIZATION (CUSTOM PROCEDURE TRAY) ×2 IMPLANT
STENT SYNERGY DES 3X28 (Permanent Stent) ×2 IMPLANT
TRANSDUCER W/STOPCOCK (MISCELLANEOUS) ×2 IMPLANT
TUBING CIL FLEX 10 FLL-RA (TUBING) ×2 IMPLANT
WIRE ASAHI FIELDER XT 190CM (WIRE) ×2 IMPLANT
WIRE COUGAR XT STRL 190CM (WIRE) ×2 IMPLANT

## 2017-05-27 NOTE — Progress Notes (Signed)
  Echocardiogram 2D Echocardiogram has been performed.  [name] 05/27/2017, 9:56 AM

## 2017-05-27 NOTE — Interval H&P Note (Signed)
History and Physical Interval Note:  05/27/2017 4:04 PM  [name]  has presented today for cardiac cath with the diagnosis of CAD/unstable angina  The various methods of treatment have been discussed with the patient and family. After consideration of risks, benefits and other options for treatment, the patient has consented to  Procedure(s): LEFT HEART CATH AND CORONARY ANGIOGRAPHY (N/A) as a surgical intervention .  The patient's history has been reviewed, patient examined, no change in status, stable for surgery.  I have reviewed the patient's chart and labs.  Questions were answered to the patient's satisfaction.    Cath Lab Visit (complete for each Cath Lab visit)  Clinical Evaluation Leading to the Procedure:   ACS: No.  Non-ACS:    Anginal Classification: CCS III  Anti-ischemic medical therapy: Minimal Therapy (1 class of medications)  Non-Invasive Test Results: No non-invasive testing performed  Prior CABG: No previous CABG         Lauree Chandler

## 2017-05-27 NOTE — Progress Notes (Signed)
Progress Note  Patient Name: [name] Date of Encounter: 05/27/2017  Primary Cardiologist: Viet Kemmerer/Weaver   Subjective   Breathing is OK  No CP    Inpatient Medications    Scheduled Meds: . amLODipine  10 mg Oral Daily  . aspirin  81 mg Oral Pre-Cath  . enoxaparin (LOVENOX) injection  40 mg Subcutaneous Q24H  . famotidine  20 mg Oral BID  . gabapentin  300 mg Oral QHS  . insulin aspart  0-15 Units Subcutaneous TID WC  . insulin aspart  0-5 Units Subcutaneous QHS  . multivitamin with minerals  1 tablet Oral Daily  . polyethylene glycol  17 g Oral Daily  . sodium chloride flush  3 mL Intravenous Q12H   Continuous Infusions: . sodium chloride    . sodium chloride 75 mL/hr at 05/26/17 2102   PRN Meds: sodium chloride, acetaminophen, ALPRAZolam, cyclobenzaprine, fluticasone, nitroGLYCERIN, ondansetron (ZOFRAN) IV, sodium chloride flush, zolpidem   Vital Signs    Vitals:   05/26/17 1434 05/26/17 1631 05/26/17 2138 05/27/17 0458  BP: (!) 160/81 (!) 141/78 134/77 139/88  Pulse: 77  82 96  Resp:      Temp: 98.6 F (37 C)  98.4 F (36.9 C) 98.9 F (37.2 C)  TempSrc: Oral  Oral Oral  SpO2: 94%  96% 94%  Weight:    210 lb 4.8 oz (95.4 kg)  Height:        Intake/Output Summary (Last 24 hours) at 05/27/17 0656 Last data filed at 05/26/17 2115  Gross per 24 hour  Intake                0 ml  Output              800 ml  Net             -800 ml   Filed Weights   05/25/17 0601 05/26/17 0556 05/27/17 0458  Weight: 200 lb (90.7 kg) 212 lb 6.4 oz (96.3 kg) 210 lb 4.8 oz (95.4 kg)    Telemetry    SR/ST   - Personally Reviewed  ECG      Physical Exam   GEN: No acute distress.   Neck: No JVD Cardiac: RRR, no murmurs, rubs, or gallops.  Respiratory: CTA GI: Soft, nontender, non-distended  MS: No edema; No deformity. Neuro:  Nonfocal  Psych: Normal affect   Labs    Chemistry Recent Labs Lab 05/25/17 0602 05/26/17 0418  NA 139 132*  K 3.5 3.5  CL 105  99*  CO2 26 22  GLUCOSE 97 152*  BUN 16 11  CREATININE 1.14* 0.91  CALCIUM 9.9 9.4  PROT 7.8  --   ALBUMIN 3.7  --   AST 25  --   ALT 17  --   ALKPHOS 76  --   BILITOT 0.5  --   GFRNONAA 47* >60  GFRAA 54* >60  ANIONGAP 8 11     Hematology Recent Labs Lab 05/25/17 0602  WBC 8.1  RBC 4.63  HGB 13.8  HCT 40.9  MCV 88.3  MCH 29.8  MCHC 33.7  RDW 15.5  PLT 268    Cardiac Enzymes Recent Labs Lab 05/25/17 1028  TROPONINI <0.03    Recent Labs Lab 05/25/17 0609  TROPIPOC 0.00     BNPNo results for input(s): BNP, PROBNP in the last 168 hours.   DDimer No results for input(s): DDIMER in the last 168 hours.   Radiology    Ct  Coronary Morph W/cta Cor W/score W/ca W/cm &/or Wo/cm  Addendum Date: 05/26/2017   ADDENDUM REPORT: 05/26/2017 17:23 CLINICAL DATA:  71 year old female with history of HTN, DM and chest pain. Negative nuclear stress test. EXAM: Cardiac/Coronary  CT TECHNIQUE: The patient was scanned on a [employer]. FINDINGS: A 120 kV prospective scan was triggered in the descending thoracic aorta at 111 HU's. Axial non-contrast 3 mm slices were carried out through the heart. The data set was analyzed on a dedicated work station and scored using the [place]. Gantry rotation speed was 250 msecs and collimation was .6 mm. 10 mg of iv Metoprolol and 0.8 mg of sl NTG was given. The 3D data set was reconstructed in 5% intervals of the 67-82 % of the R-R cycle. Diastolic phases were analyzed on a dedicated work station using MPR, MIP and VRT modes. The patient received 80 cc of contrast. Aorta:  Normal size.  Mild diffuse calcifications.  No dissection. Aortic Valve:  Trileaflet.  No calcifications. Coronary Arteries:  Normal coronary origin.  Right dominance. RCA is a large dominant artery that gives rise to PDA and PLVB. There is minimal non-obstructive plaque. Left main is a large and long artery that gives rise to LAD, ramus intermedius and LCX arteries.  Left main has no plaque. LAD is a large tortuous vessel that wraps around the apex and has minimal diffuse calcified plaque with stenosis 0-25%. RI is a medium size tortuous vessel that has severe calcified plaque in the mid portion with at least 50-69% stenosis but possibly > 70% stenosis. LCX is a medium size non-dominant artery that is only visualized in the proximal portion. This possibly represents complete occlusion. Other findings: Normal pulmonary vein drainage into the left atrium. Normal let atrial appendage without a thrombus. Mildly dilated pulmonary artery measuring 34 mm. IMPRESSION: 1. Coronary calcium score of 131. This was 31 percentile for age and sex matched control. 2. Normal coronary origin with right dominance. 3. A possible severe stenosis in the medium size ramus intermedius and complete occlusion of the LCX artery at the proximal to mid portion. Additional analysis with CT FFR will be performed. 4. Mildly dilated pulmonary artery measuring 34 mm suggestive of pulmonary hypertension. Ena Dawley Electronically Signed   By: Ena Dawley   On: 05/26/2017 17:23   Result Date: 05/26/2017 EXAM: OVER-READ INTERPRETATION  CT CHEST The following report is an over-read performed by radiologist Dr. Rebekah Chesterfield [facility] on 05/26/2017. This over-read does not include interpretation of cardiac or coronary anatomy or pathology. The coronary CTA interpretation by the cardiologist is attached. COMPARISON:  Chest CT 05/25/2017. FINDINGS: Compared to the recent prior chest CT there are areas of increasing cylindrical bronchiectasis throughout the right middle lobe and lower lobes of the lungs bilaterally, with surrounding areas of thickening of the peribronchovascular interstitium an linear areas of architectural distortion. This is favored to reflect some evolving infectious changes, although some of these findings may be chronic. No confluent airspace consolidation is noted.  No pleural effusions. No pneumothorax in the visualized thorax. Small right-sided pleural lipoma is unchanged. Prominent soft tissue in the anterior mediastinum, similar to the prior examination dating back to at least 02/25/2011, presumably benign thymic tissue. No lymphadenopathy noted in the visualized portions of the thorax. Visualized portions of the upper abdomen are unremarkable. There are no aggressive appearing lytic or blastic lesions noted in the visualized portions of the skeleton. IMPRESSION: 1. As above, there are what appear to  be evolving infectious/inflammatory changes throughout the lung bases, most evident in the in the lower lobes of lungs bilaterally. Although there is no frank airspace consolidation identified at this time, findings are concerning either for sequela of recent aspiration, severe bronchitis, or developing multilobar bronchopneumonia. Electronically Signed: By: Vinnie Langton M.D. On: 05/26/2017 16:13   Ct Angio Chest/abd/pel For Dissection W And/or W/wo  Result Date: 05/25/2017 CLINICAL DATA:  Severe midline upper chest pain radiating into the back since yesterday. Evaluate for pulmonary embolism. EXAM: CT ANGIOGRAPHY CHEST, ABDOMEN AND PELVIS TECHNIQUE: Multidetector CT imaging through the chest, abdomen and pelvis was performed using the standard protocol during bolus administration of intravenous contrast. Multiplanar reconstructed images and MIPs were obtained and reviewed to evaluate the vascular anatomy. CONTRAST:  100 ml Isovue-300 COMPARISON:  Chest radiographs 05/25/2017. Abdominopelvic CT 02/24/2010 and 02/25/2011. FINDINGS: CTA CHEST FINDINGS Cardiovascular: Pre contrast images demonstrate aortic and coronary artery atherosclerosis. There are no displaced intimal calcifications. Post-contrast, there is adequate opacification of the pulmonary arteries. There is no evidence of acute pulmonary embolus. The aorta and great vessels opacify normally. The  brachiocephalic and left common carotid artery share a common origin from the aortic arch. There is no evidence of aneurysm or dissection. The heart size is normal. There is no pericardial effusion. Mediastinum/Nodes: There are no enlarged mediastinal, hilar or axillary lymph nodes. Prominent prevascular soft tissue is unchanged from previous CTs and although atypical for age most likely represents thymic tissue. The trachea, esophagus and thyroid gland demonstrate no significant findings. Lungs/Pleura: There is no pleural effusion or pneumothorax. New patchy ground-glass opacities at both lung bases appear largely linear on the reformatted images and are most consistent with subsegmental atelectasis. There is no confluent airspace opacity. Simple lipoma noted laterally in the right third intercostal space. Musculoskeletal: No chest wall mass or suspicious osseous findings. Review of the MIP images confirms the above findings. CTA ABDOMEN AND PELVIS FINDINGS VASCULAR Aorta: Mild atherosclerosis.  No aneurysm or dissection. Celiac: Conventional anatomy.  Widely patent. SMA: Widely patent. Renals: Status post right nephrectomy. The left renal artery is duplicated but patent. IMA: Widely patent. Inflow: Widely patent. Veins: Suboptimally assessed due to timing. The left renal vein and superior aspects of the IVC appear normal. Review of the MIP images confirms the above findings. NON-VASCULAR Hepatobiliary: The liver appears normal without focal abnormality. There are small dependent gallstones in the gallbladder lumen. No gallbladder wall thickening or biliary dilatation. Pancreas: Normal appearance. No evidence of pancreatic mass, ductal dilatation or surrounding inflammation. Spleen: Normal in size without focal abnormality. Adrenals/Urinary Tract: The right adrenal gland appears normal. There is a stable 2.6 cm left adrenal nodule consistent with an adenoma based on stability. The right nephrectomy bed appears  stable. The left kidney, left ureter and bladder appear unremarkable. Stomach/Bowel: No enteric contrast was administered. The stomach is poorly distended and suboptimally evaluated. No bowel wall thickening, distention or surrounding inflammation. Lymphatic: No enlarged abdominopelvic lymph nodes. Reproductive: Hysterectomy. No evidence of adnexal mass. Suspected pelvic floor laxity. Other: No ascites or peritoneal nodularity. Musculoskeletal: Lower lumbar spondylosis status post L4-5 fusion. No acute or worrisome osseous findings. Review of the MIP images confirms the above findings. IMPRESSION: 1. No evidence of acute pulmonary embolism or other acute vascular findings. 2. Mild aortic and coronary artery atherosclerosis. 3. Stable soft tissue nodularity in the anterior mediastinum consistent with thymic tissue. 4. Linear atelectasis at both lung bases.  No consolidation. 5. Status post right nephrectomy without evidence of  local recurrence or metastatic disease. Stable left adrenal adenoma. 6. Cholelithiasis. 7. Limited bowel evaluation.  The stomach is poorly distended. Electronically Signed   By: Richardean Sale M.D.   On: 05/25/2017 12:19    Cardiac Studies    Patient Profile     71 y.o. female with history of HTN, DM, 1 kidney (s/p nephrectomy), RA  Admitted with CP    Assessment & Plan    1  CP  Pt had CT that was neg for PE  Had CT angio of heart yesterday  Noted above .  FFR done  Ramus lesion is moderate prox  With FFR 0.82  More distal FFR is below 0.8  Will still Plan for L heart cath  Today  2  Renal  COntinue hydration    CR OK    3  HTN    BP is fair    4  HL  Statin intolerant  Will need to check on repatha  5  DM  Diet controlled   For questions or updates, please contact [facility] Please consult [email or web address] for contact info under Cardiology/STEMI.      Signed, Dorris Carnes, MD  05/27/2017, 6:56 AM

## 2017-05-27 NOTE — H&P (View-Only) (Signed)
Progress Note  Patient Name: [name] Date of Encounter: 05/27/2017  Primary Cardiologist: Racquel Arkin/Weaver   Subjective   Breathing is OK  No CP    Inpatient Medications    Scheduled Meds: . amLODipine  10 mg Oral Daily  . aspirin  81 mg Oral Pre-Cath  . enoxaparin (LOVENOX) injection  40 mg Subcutaneous Q24H  . famotidine  20 mg Oral BID  . gabapentin  300 mg Oral QHS  . insulin aspart  0-15 Units Subcutaneous TID WC  . insulin aspart  0-5 Units Subcutaneous QHS  . multivitamin with minerals  1 tablet Oral Daily  . polyethylene glycol  17 g Oral Daily  . sodium chloride flush  3 mL Intravenous Q12H   Continuous Infusions: . sodium chloride    . sodium chloride 75 mL/hr at 05/26/17 2102   PRN Meds: sodium chloride, acetaminophen, ALPRAZolam, cyclobenzaprine, fluticasone, nitroGLYCERIN, ondansetron (ZOFRAN) IV, sodium chloride flush, zolpidem   Vital Signs    Vitals:   05/26/17 1434 05/26/17 1631 05/26/17 2138 05/27/17 0458  BP: (!) 160/81 (!) 141/78 134/77 139/88  Pulse: 77  82 96  Resp:      Temp: 98.6 F (37 C)  98.4 F (36.9 C) 98.9 F (37.2 C)  TempSrc: Oral  Oral Oral  SpO2: 94%  96% 94%  Weight:    210 lb 4.8 oz (95.4 kg)  Height:        Intake/Output Summary (Last 24 hours) at 05/27/17 0656 Last data filed at 05/26/17 2115  Gross per 24 hour  Intake                0 ml  Output              800 ml  Net             -800 ml   Filed Weights   05/25/17 0601 05/26/17 0556 05/27/17 0458  Weight: 200 lb (90.7 kg) 212 lb 6.4 oz (96.3 kg) 210 lb 4.8 oz (95.4 kg)    Telemetry    SR/ST   - Personally Reviewed  ECG      Physical Exam   GEN: No acute distress.   Neck: No JVD Cardiac: RRR, no murmurs, rubs, or gallops.  Respiratory: CTA GI: Soft, nontender, non-distended  MS: No edema; No deformity. Neuro:  Nonfocal  Psych: Normal affect   Labs    Chemistry Recent Labs Lab 05/25/17 0602 05/26/17 0418  NA 139 132*  K 3.5 3.5  CL 105  99*  CO2 26 22  GLUCOSE 97 152*  BUN 16 11  CREATININE 1.14* 0.91  CALCIUM 9.9 9.4  PROT 7.8  --   ALBUMIN 3.7  --   AST 25  --   ALT 17  --   ALKPHOS 76  --   BILITOT 0.5  --   GFRNONAA 47* >60  GFRAA 54* >60  ANIONGAP 8 11     Hematology Recent Labs Lab 05/25/17 0602  WBC 8.1  RBC 4.63  HGB 13.8  HCT 40.9  MCV 88.3  MCH 29.8  MCHC 33.7  RDW 15.5  PLT 268    Cardiac Enzymes Recent Labs Lab 05/25/17 1028  TROPONINI <0.03    Recent Labs Lab 05/25/17 0609  TROPIPOC 0.00     BNPNo results for input(s): BNP, PROBNP in the last 168 hours.   DDimer No results for input(s): DDIMER in the last 168 hours.   Radiology    Ct  Coronary Morph W/cta Cor W/score W/ca W/cm &/or Wo/cm  Addendum Date: 05/26/2017   ADDENDUM REPORT: 05/26/2017 17:23 CLINICAL DATA:  71 year old female with history of HTN, DM and chest pain. Negative nuclear stress test. EXAM: Cardiac/Coronary  CT TECHNIQUE: The patient was scanned on a [employer]. FINDINGS: A 120 kV prospective scan was triggered in the descending thoracic aorta at 111 HU's. Axial non-contrast 3 mm slices were carried out through the heart. The data set was analyzed on a dedicated work station and scored using the [place]. Gantry rotation speed was 250 msecs and collimation was .6 mm. 10 mg of iv Metoprolol and 0.8 mg of sl NTG was given. The 3D data set was reconstructed in 5% intervals of the 67-82 % of the R-R cycle. Diastolic phases were analyzed on a dedicated work station using MPR, MIP and VRT modes. The patient received 80 cc of contrast. Aorta:  Normal size.  Mild diffuse calcifications.  No dissection. Aortic Valve:  Trileaflet.  No calcifications. Coronary Arteries:  Normal coronary origin.  Right dominance. RCA is a large dominant artery that gives rise to PDA and PLVB. There is minimal non-obstructive plaque. Left main is a large and long artery that gives rise to LAD, ramus intermedius and LCX arteries.  Left main has no plaque. LAD is a large tortuous vessel that wraps around the apex and has minimal diffuse calcified plaque with stenosis 0-25%. RI is a medium size tortuous vessel that has severe calcified plaque in the mid portion with at least 50-69% stenosis but possibly > 70% stenosis. LCX is a medium size non-dominant artery that is only visualized in the proximal portion. This possibly represents complete occlusion. Other findings: Normal pulmonary vein drainage into the left atrium. Normal let atrial appendage without a thrombus. Mildly dilated pulmonary artery measuring 34 mm. IMPRESSION: 1. Coronary calcium score of 131. This was 69 percentile for age and sex matched control. 2. Normal coronary origin with right dominance. 3. A possible severe stenosis in the medium size ramus intermedius and complete occlusion of the LCX artery at the proximal to mid portion. Additional analysis with CT FFR will be performed. 4. Mildly dilated pulmonary artery measuring 34 mm suggestive of pulmonary hypertension. Ena Dawley Electronically Signed   By: Ena Dawley   On: 05/26/2017 17:23   Result Date: 05/26/2017 EXAM: OVER-READ INTERPRETATION  CT CHEST The following report is an over-read performed by radiologist Dr. Rebekah [facility]field [facility] on 05/26/2017. This over-read does not include interpretation of cardiac or coronary anatomy or pathology. The coronary CTA interpretation by the cardiologist is attached. COMPARISON:  Chest CT 05/25/2017. FINDINGS: Compared to the recent prior chest CT there are areas of increasing cylindrical bronchiectasis throughout the right middle lobe and lower lobes of the lungs bilaterally, with surrounding areas of thickening of the peribronchovascular interstitium an linear areas of architectural distortion. This is favored to reflect some evolving infectious changes, although some of these findings may be chronic. No confluent airspace consolidation is noted.  No pleural effusions. No pneumothorax in the visualized thorax. Small right-sided pleural lipoma is unchanged. Prominent soft tissue in the anterior mediastinum, similar to the prior examination dating back to at least 02/25/2011, presumably benign thymic tissue. No lymphadenopathy noted in the visualized portions of the thorax. Visualized portions of the upper abdomen are unremarkable. There are no aggressive appearing lytic or blastic lesions noted in the visualized portions of the skeleton. IMPRESSION: 1. As above, there are what appear to  be evolving infectious/inflammatory changes throughout the lung bases, most evident in the in the lower lobes of lungs bilaterally. Although there is no frank airspace consolidation identified at this time, findings are concerning either for sequela of recent aspiration, severe bronchitis, or developing multilobar bronchopneumonia. Electronically Signed: By: Vinnie Langton M.D. On: 05/26/2017 16:13   Ct Angio Chest/abd/pel For Dissection W And/or W/wo  Result Date: 05/25/2017 CLINICAL DATA:  Severe midline upper chest pain radiating into the back since yesterday. Evaluate for pulmonary embolism. EXAM: CT ANGIOGRAPHY CHEST, ABDOMEN AND PELVIS TECHNIQUE: Multidetector CT imaging through the chest, abdomen and pelvis was performed using the standard protocol during bolus administration of intravenous contrast. Multiplanar reconstructed images and MIPs were obtained and reviewed to evaluate the vascular anatomy. CONTRAST:  100 ml Isovue-300 COMPARISON:  Chest radiographs 05/25/2017. Abdominopelvic CT 02/24/2010 and 02/25/2011. FINDINGS: CTA CHEST FINDINGS Cardiovascular: Pre contrast images demonstrate aortic and coronary artery atherosclerosis. There are no displaced intimal calcifications. Post-contrast, there is adequate opacification of the pulmonary arteries. There is no evidence of acute pulmonary embolus. The aorta and great vessels opacify normally. The  brachiocephalic and left common carotid artery share a common origin from the aortic arch. There is no evidence of aneurysm or dissection. The heart size is normal. There is no pericardial effusion. Mediastinum/Nodes: There are no enlarged mediastinal, hilar or axillary lymph nodes. Prominent prevascular soft tissue is unchanged from previous CTs and although atypical for age most likely represents thymic tissue. The trachea, esophagus and thyroid gland demonstrate no significant findings. Lungs/Pleura: There is no pleural effusion or pneumothorax. New patchy ground-glass opacities at both lung bases appear largely linear on the reformatted images and are most consistent with subsegmental atelectasis. There is no confluent airspace opacity. Simple lipoma noted laterally in the right third intercostal space. Musculoskeletal: No chest wall mass or suspicious osseous findings. Review of the MIP images confirms the above findings. CTA ABDOMEN AND PELVIS FINDINGS VASCULAR Aorta: Mild atherosclerosis.  No aneurysm or dissection. Celiac: Conventional anatomy.  Widely patent. SMA: Widely patent. Renals: Status post right nephrectomy. The left renal artery is duplicated but patent. IMA: Widely patent. Inflow: Widely patent. Veins: Suboptimally assessed due to timing. The left renal vein and superior aspects of the IVC appear normal. Review of the MIP images confirms the above findings. NON-VASCULAR Hepatobiliary: The liver appears normal without focal abnormality. There are small dependent gallstones in the gallbladder lumen. No gallbladder wall thickening or biliary dilatation. Pancreas: Normal appearance. No evidence of pancreatic mass, ductal dilatation or surrounding inflammation. Spleen: Normal in size without focal abnormality. Adrenals/Urinary Tract: The right adrenal gland appears normal. There is a stable 2.6 cm left adrenal nodule consistent with an adenoma based on stability. The right nephrectomy bed appears  stable. The left kidney, left ureter and bladder appear unremarkable. Stomach/Bowel: No enteric contrast was administered. The stomach is poorly distended and suboptimally evaluated. No bowel wall thickening, distention or surrounding inflammation. Lymphatic: No enlarged abdominopelvic lymph nodes. Reproductive: Hysterectomy. No evidence of adnexal mass. Suspected pelvic floor laxity. Other: No ascites or peritoneal nodularity. Musculoskeletal: Lower lumbar spondylosis status post L4-5 fusion. No acute or worrisome osseous findings. Review of the MIP images confirms the above findings. IMPRESSION: 1. No evidence of acute pulmonary embolism or other acute vascular findings. 2. Mild aortic and coronary artery atherosclerosis. 3. Stable soft tissue nodularity in the anterior mediastinum consistent with thymic tissue. 4. Linear atelectasis at both lung bases.  No consolidation. 5. Status post right nephrectomy without evidence of  local recurrence or metastatic disease. Stable left adrenal adenoma. 6. Cholelithiasis. 7. Limited bowel evaluation.  The stomach is poorly distended. Electronically Signed   By: Richardean Sale M.D.   On: 05/25/2017 12:19    Cardiac Studies    Patient Profile     71 y.o. female with history of HTN, DM, 1 kidney (s/p nephrectomy), RA  Admitted with CP    Assessment & Plan    1  CP  Pt had CT that was neg for PE  Had CT angio of heart yesterday  Noted above .  FFR done  Ramus lesion is moderate prox  With FFR 0.82  More distal FFR is below 0.8  Will still Plan for L heart cath  Today  2  Renal  COntinue hydration    CR OK    3  HTN    BP is fair    4  HL  Statin intolerant  Will need to check on repatha  5  DM  Diet controlled   For questions or updates, please contact [facility] Please consult [email or web address] for contact info under Cardiology/STEMI.      Signed, Dorris Carnes, MD  05/27/2017, 6:56 AM

## 2017-05-28 ENCOUNTER — Encounter (HOSPITAL_COMMUNITY): Payer: Self-pay | Admitting: Cardiology

## 2017-05-28 DIAGNOSIS — I951 Orthostatic hypotension: Secondary | ICD-10-CM

## 2017-05-28 DIAGNOSIS — I25110 Atherosclerotic heart disease of native coronary artery with unstable angina pectoris: Principal | ICD-10-CM

## 2017-05-28 DIAGNOSIS — Z95820 Peripheral vascular angioplasty status with implants and grafts: Secondary | ICD-10-CM

## 2017-05-28 DIAGNOSIS — I950 Idiopathic hypotension: Secondary | ICD-10-CM

## 2017-05-28 HISTORY — DX: Peripheral vascular angioplasty status with implants and grafts: Z95.820

## 2017-05-28 HISTORY — DX: Orthostatic hypotension: I95.1

## 2017-05-28 LAB — GLUCOSE, CAPILLARY
Glucose-Capillary: 90 mg/dL (ref 65–99)
Glucose-Capillary: 128 mg/dL — ABNORMAL HIGH (ref 65–99)
Glucose-Capillary: 83 mg/dL (ref 65–99)
Glucose-Capillary: 157 mg/dL — ABNORMAL HIGH (ref 65–99)

## 2017-05-28 LAB — BASIC METABOLIC PANEL
Anion gap: 11 (ref 5–15)
BUN: 11 mg/dL (ref 6–20)
CO2: 22 mmol/L (ref 22–32)
Calcium: 9.2 mg/dL (ref 8.9–10.3)
Chloride: 101 mmol/L (ref 101–111)
Creatinine, Ser: 1.25 mg/dL — ABNORMAL HIGH (ref 0.44–1.00)
GFR calc Af Amer: 48 mL/min — ABNORMAL LOW (ref >60)
GFR calc non Af Amer: 42 mL/min — ABNORMAL LOW (ref >60)
Glucose, Bld: 102 mg/dL — ABNORMAL HIGH (ref 65–99)
Potassium: 3.6 mmol/L (ref 3.5–5.1)
Sodium: 134 mmol/L — ABNORMAL LOW (ref 135–145)

## 2017-05-28 LAB — CBC
HCT: 42.1 % (ref 36.0–46.0)
Hemoglobin: 14.4 g/dL (ref 12.0–15.0)
MCH: 29.5 pg (ref 26.0–34.0)
MCHC: 34.2 g/dL (ref 30.0–36.0)
MCV: 86.3 fL (ref 78.0–100.0)
Platelets: 250 10*3/uL (ref 150–400)
RBC: 4.88 MIL/uL (ref 3.87–5.11)
RDW: 15.6 % — ABNORMAL HIGH (ref 11.5–15.5)
WBC: 14.2 10*3/uL — ABNORMAL HIGH (ref 4.0–10.5)

## 2017-05-28 MED ORDER — TICAGRELOR 90 MG PO TABS: 90.0000 mg | ORAL_TABLET | Freq: Two times a day (BID) | ORAL | 10 refills | Status: DC

## 2017-05-28 MED ORDER — SODIUM CHLORIDE 0.9 % IV BOLUS (SEPSIS)
250.0000 mL | Freq: Once | INTRAVENOUS | Status: AC
  Administered 2017-05-28: 250 mL via INTRAVENOUS

## 2017-05-28 MED ORDER — AMLODIPINE BESYLATE 5 MG PO TABS: 5.0000 mg | ORAL_TABLET | Freq: Every day | ORAL | Status: DC

## 2017-05-28 MED ORDER — ASPIRIN 81 MG PO CHEW: 81.0000 mg | CHEWABLE_TABLET | Freq: Every day | ORAL | Status: DC

## 2017-05-28 MED ORDER — AMLODIPINE BESYLATE 5 MG PO TABS: 5.0000 mg | ORAL_TABLET | Freq: Every day | ORAL | 6 refills | Status: DC

## 2017-05-28 MED ORDER — SODIUM CHLORIDE 0.9 % IV BOLUS (SEPSIS)
500.0000 mL | Freq: Once | INTRAVENOUS | Status: AC
  Administered 2017-05-28: 500 mL via INTRAVENOUS

## 2017-05-28 MED ORDER — ROSUVASTATIN CALCIUM 5 MG PO TABS: 5.0000 mg | ORAL_TABLET | Freq: Every day | ORAL | 6 refills | Status: DC

## 2017-05-28 MED ORDER — TICAGRELOR 90 MG PO TABS: 90.0000 mg | ORAL_TABLET | Freq: Two times a day (BID) | ORAL | 0 refills | Status: DC

## 2017-05-28 MED ORDER — ROSUVASTATIN CALCIUM 10 MG PO TABS: 5.0000 mg | ORAL_TABLET | Freq: Every day | ORAL | Status: DC

## 2017-05-28 MED ORDER — METOPROLOL TARTRATE 12.5 MG HALF TABLET
12.5000 mg | ORAL_TABLET | Freq: Two times a day (BID) | ORAL | Status: DC
  Administered 2017-05-28 – 2017-05-29 (×3): 12.5 mg via ORAL
  Filled 2017-05-28 (×3): qty 1

## 2017-05-28 MED ORDER — ROSUVASTATIN CALCIUM 10 MG PO TABS
5.0000 mg | ORAL_TABLET | Freq: Every day | ORAL | Status: DC
  Administered 2017-05-28: 5 mg via ORAL
  Filled 2017-05-28: qty 1

## 2017-05-28 MED ORDER — METOPROLOL TARTRATE 25 MG PO TABS: 12.5000 mg | ORAL_TABLET | Freq: Two times a day (BID) | ORAL | 6 refills | Status: DC

## 2017-05-28 MED ORDER — NITROGLYCERIN 0.4 MG SL SUBL: 0.4000 mg | SUBLINGUAL_TABLET | SUBLINGUAL | 6 refills | Status: DC | PRN

## 2017-05-28 NOTE — Progress Notes (Signed)
Checked on patient, she states she feels better, BP now 113/67, HR 114, O2 sats 100% on RA.  Will continue to monitor

## 2017-05-28 NOTE — Progress Notes (Signed)
TR BAND REMOVAL  LOCATION:    right radial  DEFLATED PER PROTOCOL:    Yes.    TIME BAND OFF / DRESSING APPLIED:    0100   SITE UPON ARRIVAL:    Level 0  SITE AFTER BAND REMOVAL:    Level 0  CIRCULATION SENSATION AND MOVEMENT:    Within Normal Limits   Yes.    COMMENTS:

## 2017-05-28 NOTE — Progress Notes (Signed)
CARDIAC REHAB PHASE I   PRE:  Rate/Rhythm:95 sinus rhythm   BP:  Supine: 94/43  Sitting: 107/74  Standing: 61/48   SaO2: 95ra   MODE:  Ambulation: none   POST:  Rate/Rhythem: NA  BP:  Supine: NA  Sitting: NA  Standing: NA   SaO2: NA 0850-945 Ambulation held due to orthostatic hypotension.  Pt c/o mild dizziness with standing.  Pt returned to bed, call light in reach.  RN and NP made aware.   Education completed including risk factor modification, low fat-low cholesterol diet, exercise, and medication compliance.  Pt oriented to outpatient cardiac rehab.  At pt request, referral will be sent to [facility].  Understanding verbalized  [employer]

## 2017-05-28 NOTE — Progress Notes (Signed)
Pt with transfer order to 6E21. Report given to [employer]. No c/o any chest pain or discomfort. V/S stable. Transferred without complications.

## 2017-05-28 NOTE — Discharge Summary (Addendum)
Discharge Summary    Patient ID: [name],  MRN: [id number], DOB/AGE: 73/73/1947 73 y.o.  Admit date: 05/25/2017 Discharge date: 05/29/2017  Primary Care Provider: [name], [name] Primary Cardiologist: Dr. Harrington Challenger  Discharge Diagnoses    Principal Problem:   Coronary artery disease involving native coronary artery of native heart with unstable angina pectoris [facility]) Active Problems:   S/P angioplasty with stent 05/27/17 to LCX with DES    History of Rtr nephrectomy 2010, secondary to renal cell cancer   Non-insulin dependent type 2 diabetes mellitus -diet   Dyslipidemia   Essential hypertension   GERD   Rheumatoid arthritis (HCC)   CKD (chronic kidney disease) stage 3, GFR 30-59 ml/min (HCC)   Class 1 obesity with serious comorbidity and body mass index (BMI) of 34.0 to 34.9 in adult   Polyneuropathy   Orthostatic hypotension       Allergies Allergies  Allergen Reactions  . Percocet [Oxycodone-Acetaminophen] Hives, Itching and Other (See Comments)    hallucinations  . Pravachol [Pravastatin Sodium] Other (See Comments)    Muscle aches  . Hydrocodone Other (See Comments)    "crazy dreams"  . Aspirin Other (See Comments)    REACTION: GI upset    Diagnostic Studies/Procedures    Procedures   CORONARY STENT INTERVENTION  LEFT HEART CATH AND CORONARY ANGIOGRAPHY  Conclusion     Mid RCA lesion, 20 %stenosed.  A STENT SYNERGY DES 3X28 drug eluting stent was successfully placed.  Mid Cx lesion, 100 %stenosed.  Post intervention, there is a 0% residual stenosis.  Ramus lesion, 50 %stenosed.  Dist LAD lesion, 30 %stenosed.  Prox LAD to Mid LAD lesion, 20 %stenosed.  Mid LAD lesion, 20 %stenosed.  1. Mild non-obstructive disease in the LAD and RCA 2. Moderate stenosis in the distal segment of the moderate caliber intermediate branch 3. Chronic total occlusion of the mid Circumflex artery. There is dye staining in the occluded segment suggesting  sub-acute lesion. The distal segment and OM branch fills from left to left collaterals slowly.  4. Successful PTCA/DES x 1 mid Circumflex artery  Recommendations: Will continue DAPT with ASA and Brilinta. She does not have a true ASA allergy. High dose statin. Continue beta blocker.     ECHO 05/27/17 Study Conclusions  - Left ventricle: The cavity size was normal. There was mild concentric hypertrophy. Systolic function was vigorous. The estimated ejection fraction was in the range of 65% to 70%. Wall motion was normal; there were no regional wall motion abnormalities. Doppler parameters are consistent with abnormal left ventricular relaxation (grade 1 diastolic dysfunction). - Tricuspid valve: There was trivial regurgitation.  Impressions:  - Compared to the prior study, there has been no significant interval change.   Cardiac CT   FINDINGS: FFRct analysis was performed on the original cardiac CT angiogram dataset. Diagrammatic representation of the FFRct analysis is provided in a separate PDF document in PACS. This dictation was created using the PDF document and an interactive 3D model of the results. 3D model is not available in the EMR/PACS. Normal FFR range is >0.80.  1. Left Main: No significant stenosis.  2. LAD: No significant stenosis. 3. Ramus intermedius: Proximal FFR: 0.95, mid FFR 0.82, distal FFR 0.78. 4. LCX: No significant stenosis in the proximal portion. Proximal to mid portion is occluded. 5. RCA: No significant stenosis.  IMPRESSION: 1. CT FFR analysis showed significant stenosis in the distal ramus intermedius and occluded LCX after the proximal segment.  _____________   History of Present Illness     71 y.o.femalewith a hx of HTN, DM2, CKD, renal CA s/p R nephrectomy 2010, RA, and polyneuropathy followed by Dr Leta Baptist. She had chest pain during a hospitalization for lumbar spine surgery c/b C diff colitis and  AKI in 10/2014. She had minimally elevated Troponin levels then. An Echo then showed normal ejection fraction and mild diastolic dysfunction. OP myocardial perfusion imaging study June 2016 was normal. She was seen back in the office in 3/18 for evaluation of chest pain and shortness of breath.An echocardiogram was done 10/27/16 which demonstrated normal LV function and mild diastolic dysfunction with moderate LVH. Nuclear stress 10/27/16 testing was also obtained. This demonstrated a lateral defect that was reversible. It was reviewed by Dr. Harrington Challenger who felt that there was no significant ischemia identified.  The pt presented to the ED10/24/18 with complaints of chest pain. The night prior she developed central chest pain described as a burning sensation. She thought it was reflux and she took a Zantac at home and symptoms partially improved and she was able to sleep at night.  She stated she was awakened with severe recurrent chest pain 4 am. This was associated with mild nausea as well as generalized mild abdominal discomfort. She developed diaphoresis and felt very hot in route to the emergency department. No fever, cough, vomiting, diarrhea, leg swelling or pain. She did complain of mild shortness of breath. Troponin is negative x 2 and her EKG shows no acute changes. She had recurrent chest pain that was unrelieved in the ED and radiated to her back and she was sent to the ED for CTA to r/o dissection which was negative for PE, or dissection. She had "mild" CAD noted on CT.   She was admitted to rule out MI, and evaluate for CAD.    Hospital Course     Consultants: none  Pt's tropoins were neg.  She had cardiac CT to eval for CAD and this was + with low FFR.  She then had cardiac cath.  With subacute stenosis of LCX and had PCI.  She tolerated the procedure well  Today she has orthostatic hypotension though at 3 min her BP becomes stable.  We decreased amlodipine and added lopressor 12.5  BID for tachycardia and CAD.  Also WCT.   Her EF is normal.  She has been given 250 ml of NS and TED hose. Rt radial cath site is stable.  We added Crestor for cholesterol, she has been intolerant in past to statin but has not tried crestor. If has problems will need Lipid clinic for repatha.   BP remained low with standing, total of 1 L IV fluids given and by 06/29/17 pt was stable without orthostatic hypotension.  Her amlodipine was stopped.  She will follow up with Dr. Harrington Challenger or APP in 1 week.  _____________  Discharge Vitals Blood pressure 116/89, pulse 92, temperature 99.9 F (37.7 C), temperature source Oral, resp. rate (!) 31, height 5\' 5"  (1.651 m), weight 211 lb 3.2 oz (95.8 kg), SpO2 98 %.  Filed Weights   05/26/17 0556 05/27/17 0458 05/29/17 0628  Weight: 212 lb 6.4 oz (96.3 kg) 210 lb 4.8 oz (95.4 kg) 211 lb 3.2 oz (95.8 kg)    Labs & Radiologic Studies    CBC  Recent Labs  05/28/17 0650  WBC 14.2*  HGB 14.4  HCT 42.1  MCV 86.3  PLT 737   Basic Metabolic Panel  Recent Labs  05/27/17 0845 05/28/17 0650  NA 133* 134*  K 4.0 3.6  CL 102 101  CO2 20* 22  GLUCOSE 115* 102*  BUN 9 11  CREATININE 1.16* 1.25*  CALCIUM 9.3 9.2   Liver Function Tests No results for input(s): AST, ALT, ALKPHOS, BILITOT, PROT, ALBUMIN in the last 72 hours. No results for input(s): LIPASE, AMYLASE in the last 72 hours. Cardiac Enzymes No results for input(s): CKTOTAL, CKMB, CKMBINDEX, TROPONINI in the last 72 hours. BNP Invalid input(s): POCBNP D-Dimer No results for input(s): DDIMER in the last 72 hours. Hemoglobin A1C No results for input(s): HGBA1C in the last 72 hours. Fasting Lipid Panel No results for input(s): CHOL, HDL, LDLCALC, TRIG, CHOLHDL, LDLDIRECT in the last 72 hours. Thyroid Function Tests No results for input(s): TSH, T4TOTAL, T3FREE, THYROIDAB in the last 72 hours.  Invalid input(s): FREET3 _____________  Dg Chest 2 View  Result Date: 05/25/2017 CLINICAL  DATA:  Chest pain and shortness of breath, cough for 1 day. History of hypertension, diabetes, smoker. EXAM: CHEST  2 VIEW COMPARISON:  Chest radiograph October 01, 2016 FINDINGS: The cardiac silhouette is mildly enlarged and unchanged. Mediastinal silhouette is nonsuspicious. Bibasilar strandy densities, similar to increased from prior examination. Bronchitic changes. No pleural effusion. No pneumothorax. Mild degenerative change of the thoracic spine. IMPRESSION: Worsening bibasilar atelectasis/scarring. Mild bronchitic changes. Mild cardiomegaly. Electronically Signed   By: Elon Alas M.D.   On: 05/25/2017 06:28   Ct Coronary Morph W/cta Cor W/score W/ca W/cm &/or Wo/cm  Addendum Date: 05/27/2017   ADDENDUM REPORT: 05/27/2017 14:25 EXAM: FF/RCT ANALYSIS FINDINGS: FFRct analysis was performed on the original cardiac CT angiogram dataset. Diagrammatic representation of the FFRct analysis is provided in a separate PDF document in PACS. This dictation was created using the PDF document and an interactive 3D model of the results. 3D model is not available in the EMR/PACS. Normal FFR range is >0.80. 1. Left Main:  No significant stenosis. 2. LAD: No significant stenosis. 3. Ramus intermedius: Proximal FFR: 0.95, mid FFR 0.82, distal FFR 0.78. 4. LCX: No significant stenosis in the proximal portion. Proximal to mid portion is occluded. 5. RCA: No significant stenosis. IMPRESSION: 1. CT FFR analysis showed significant stenosis in the distal ramus intermedius and occluded LCX after the proximal segment. Electronically Signed   By: Ena Dawley   On: 05/27/2017 14:25   Addendum Date: 05/26/2017   ADDENDUM REPORT: 05/26/2017 17:23 CLINICAL DATA:  71 year old female with history of HTN, DM and chest pain. Negative nuclear stress test. EXAM: Cardiac/Coronary  CT TECHNIQUE: The patient was scanned on a [employer]. FINDINGS: A 120 kV prospective scan was triggered in the descending thoracic aorta at  111 HU's. Axial non-contrast 3 mm slices were carried out through the heart. The data set was analyzed on a dedicated work station and scored using the [employer]. Gantry rotation speed was 250 msecs and collimation was .6 mm. 10 mg of iv Metoprolol and 0.8 mg of sl NTG was given. The 3D data set was reconstructed in 5% intervals of the 67-82 % of the R-R cycle. Diastolic phases were analyzed on a dedicated work station using MPR, MIP and VRT modes. The patient received 80 cc of contrast. Aorta:  Normal size.  Mild diffuse calcifications.  No dissection. Aortic Valve:  Trileaflet.  No calcifications. Coronary Arteries:  Normal coronary origin.  Right dominance. RCA is a large dominant artery that gives rise to PDA and PLVB. There is minimal non-obstructive plaque. Left  main is a large and long artery that gives rise to LAD, ramus intermedius and LCX arteries. Left main has no plaque. LAD is a large tortuous vessel that wraps around the apex and has minimal diffuse calcified plaque with stenosis 0-25%. RI is a medium size tortuous vessel that has severe calcified plaque in the mid portion with at least 50-69% stenosis but possibly > 70% stenosis. LCX is a medium size non-dominant artery that is only visualized in the proximal portion. This possibly represents complete occlusion. Other findings: Normal pulmonary vein drainage into the left atrium. Normal let atrial appendage without a thrombus. Mildly dilated pulmonary artery measuring 34 mm. IMPRESSION: 1. Coronary calcium score of 131. This was 38 percentile for age and sex matched control. 2. Normal coronary origin with right dominance. 3. A possible severe stenosis in the medium size ramus intermedius and complete occlusion of the LCX artery at the proximal to mid portion. Additional analysis with CT FFR will be performed. 4. Mildly dilated pulmonary artery measuring 34 mm suggestive of pulmonary hypertension. Ena Dawley Electronically Signed   By:  Ena Dawley   On: 05/26/2017 17:23   Result Date: 05/27/2017 EXAM: OVER-READ INTERPRETATION  CT CHEST The following report is an over-read performed by radiologist Dr. Rebekah Chesterfield [facility] on 05/26/2017. This over-read does not include interpretation of cardiac or coronary anatomy or pathology. The coronary CTA interpretation by the cardiologist is attached. COMPARISON:  Chest CT 05/25/2017. FINDINGS: Compared to the recent prior chest CT there are areas of increasing cylindrical bronchiectasis throughout the right middle lobe and lower lobes of the lungs bilaterally, with surrounding areas of thickening of the peribronchovascular interstitium an linear areas of architectural distortion. This is favored to reflect some evolving infectious changes, although some of these findings may be chronic. No confluent airspace consolidation is noted. No pleural effusions. No pneumothorax in the visualized thorax. Small right-sided pleural lipoma is unchanged. Prominent soft tissue in the anterior mediastinum, similar to the prior examination dating back to at least 02/25/2011, presumably benign thymic tissue. No lymphadenopathy noted in the visualized portions of the thorax. Visualized portions of the upper abdomen are unremarkable. There are no aggressive appearing lytic or blastic lesions noted in the visualized portions of the skeleton. IMPRESSION: 1. As above, there are what appear to be evolving infectious/inflammatory changes throughout the lung bases, most evident in the in the lower lobes of lungs bilaterally. Although there is no frank airspace consolidation identified at this time, findings are concerning either for sequela of recent aspiration, severe bronchitis, or developing multilobar bronchopneumonia. Electronically Signed: By: Vinnie Langton M.D. On: 05/26/2017 16:13   Ct Angio Chest/abd/pel For Dissection W And/or W/wo  Result Date: 05/25/2017 CLINICAL DATA:  Severe midline  upper chest pain radiating into the back since yesterday. Evaluate for pulmonary embolism. EXAM: CT ANGIOGRAPHY CHEST, ABDOMEN AND PELVIS TECHNIQUE: Multidetector CT imaging through the chest, abdomen and pelvis was performed using the standard protocol during bolus administration of intravenous contrast. Multiplanar reconstructed images and MIPs were obtained and reviewed to evaluate the vascular anatomy. CONTRAST:  100 ml Isovue-300 COMPARISON:  Chest radiographs 05/25/2017. Abdominopelvic CT 02/24/2010 and 02/25/2011. FINDINGS: CTA CHEST FINDINGS Cardiovascular: Pre contrast images demonstrate aortic and coronary artery atherosclerosis. There are no displaced intimal calcifications. Post-contrast, there is adequate opacification of the pulmonary arteries. There is no evidence of acute pulmonary embolus. The aorta and great vessels opacify normally. The brachiocephalic and left common carotid artery share a common origin from the  aortic arch. There is no evidence of aneurysm or dissection. The heart size is normal. There is no pericardial effusion. Mediastinum/Nodes: There are no enlarged mediastinal, hilar or axillary lymph nodes. Prominent prevascular soft tissue is unchanged from previous CTs and although atypical for age most likely represents thymic tissue. The trachea, esophagus and thyroid gland demonstrate no significant findings. Lungs/Pleura: There is no pleural effusion or pneumothorax. New patchy ground-glass opacities at both lung bases appear largely linear on the reformatted images and are most consistent with subsegmental atelectasis. There is no confluent airspace opacity. Simple lipoma noted laterally in the right third intercostal space. Musculoskeletal: No chest wall mass or suspicious osseous findings. Review of the MIP images confirms the above findings. CTA ABDOMEN AND PELVIS FINDINGS VASCULAR Aorta: Mild atherosclerosis.  No aneurysm or dissection. Celiac: Conventional anatomy.  Widely  patent. SMA: Widely patent. Renals: Status post right nephrectomy. The left renal artery is duplicated but patent. IMA: Widely patent. Inflow: Widely patent. Veins: Suboptimally assessed due to timing. The left renal vein and superior aspects of the IVC appear normal. Review of the MIP images confirms the above findings. NON-VASCULAR Hepatobiliary: The liver appears normal without focal abnormality. There are small dependent gallstones in the gallbladder lumen. No gallbladder wall thickening or biliary dilatation. Pancreas: Normal appearance. No evidence of pancreatic mass, ductal dilatation or surrounding inflammation. Spleen: Normal in size without focal abnormality. Adrenals/Urinary Tract: The right adrenal gland appears normal. There is a stable 2.6 cm left adrenal nodule consistent with an adenoma based on stability. The right nephrectomy bed appears stable. The left kidney, left ureter and bladder appear unremarkable. Stomach/Bowel: No enteric contrast was administered. The stomach is poorly distended and suboptimally evaluated. No bowel wall thickening, distention or surrounding inflammation. Lymphatic: No enlarged abdominopelvic lymph nodes. Reproductive: Hysterectomy. No evidence of adnexal mass. Suspected pelvic floor laxity. Other: No ascites or peritoneal nodularity. Musculoskeletal: Lower lumbar spondylosis status post L4-5 fusion. No acute or worrisome osseous findings. Review of the MIP images confirms the above findings. IMPRESSION: 1. No evidence of acute pulmonary embolism or other acute vascular findings. 2. Mild aortic and coronary artery atherosclerosis. 3. Stable soft tissue nodularity in the anterior mediastinum consistent with thymic tissue. 4. Linear atelectasis at both lung bases.  No consolidation. 5. Status post right nephrectomy without evidence of local recurrence or metastatic disease. Stable left adrenal adenoma. 6. Cholelithiasis. 7. Limited bowel evaluation.  The stomach is poorly  distended. Electronically Signed   By: Richardean Sale M.D.   On: 05/25/2017 12:19   Disposition   Pt is being discharged home today in good condition.  Follow-up Plans & Appointments   Call [facility] at [phone] if any bleeding, swelling or drainage at cath site.  May shower, no tub baths for 48 hours for groin sticks. No lifting over 5 pounds for 3 days.  No Driving for 3 days   Take 1 NTG, under your tongue, while sitting.  If no relief of pain may repeat NTG, one tab every 5 minutes up to 3 tablets total over 15 minutes.  If no relief CALL 911.  If you have dizziness/lightheadness  while taking NTG, stop taking and call 911.        Heart Healthy Diabetic Diet   Do not stop ASA and Brilinta.  These are to keep your stent open.     Follow-up Information    Fay Records, MD Follow up.   Specialty:  Cardiology Why:  the office  should call you Monday for appt date and time Contact information: [address] [place] [address] [phone]          Wear your stockings to help prevent BP from dropping when you get up.  Also stand by chair or bed for 5 min with standing.  Call if continued lightheadedness.    Discharge Instructions    Amb Referral to Cardiac Rehabilitation    Complete by:  As directed    Diagnosis:  Coronary Stents      Discharge Medications   Current Discharge Medication List    START taking these medications   Details  aspirin 81 MG chewable tablet Chew 1 tablet (81 mg total) by mouth daily.    metoprolol tartrate (LOPRESSOR) 25 MG tablet Take 0.5 tablets (12.5 mg total) by mouth 2 (two) times daily. Qty: 15 tablet, Refills: 6    nitroGLYCERIN (NITROSTAT) 0.4 MG SL tablet Place 1 tablet (0.4 mg total) under the tongue every 5 (five) minutes as needed for chest pain. Qty: 30 tablet, Refills: 6    rosuvastatin (CRESTOR) 5 MG tablet Take 1 tablet (5 mg total) by mouth daily at 6 PM. Qty: 30 tablet,  Refills: 6    ticagrelor (BRILINTA) 90 MG TABS tablet Take 1 tablet (90 mg total) by mouth 2 (two) times daily. Qty: 60 tablet, Refills: 10      CONTINUE these medications which have CHANGED   Details  amLODipine (NORVASC) 5 MG tablet Take 1 tablet (5 mg total) by mouth daily. Qty: 30 tablet, Refills: 6      CONTINUE these medications which have NOT CHANGED   Details  acetaminophen (TYLENOL) 500 MG tablet Take 500 mg by mouth 2 (two) times daily as needed (pain).    cyclobenzaprine (FLEXERIL) 5 MG tablet Take 1 tablet (5 mg total) by mouth 3 (three) times daily as needed for muscle spasms. Qty: 30 tablet, Refills: 1    fluticasone (FLONASE) 50 MCG/ACT nasal spray Place 1 spray into both nostrils 2 (two) times daily as needed for allergies. Qty: 32 g, Refills: 7   Associated Diagnoses: URI, acute    gabapentin (NEURONTIN) 300 MG capsule Take 1 capsule (300 mg total) by mouth at bedtime. Qty: 90 capsule, Refills: 4    mirabegron ER (MYRBETRIQ) 25 MG TB24 tablet Take 25 mg by mouth as needed.    Multiple Vitamin (MULTIVITAMIN) tablet Take 1 tablet by mouth daily.    NONFORMULARY OR COMPOUNDED ITEM Rx for compounded cream faxed to [facility]    polyethylene glycol powder (GLYCOLAX/MIRALAX) powder Take 17 g by mouth daily. Qty: 500 g, Refills: 3    ranitidine (ZANTAC) 150 MG tablet Take 1 tablet (150 mg total) by mouth 2 (two) times daily. Qty: 180 tablet, Refills: 2   Associated Diagnoses: Gastroesophageal reflux disease with esophagitis      STOP taking these medications     losartan-hydrochlorothiazide (HYZAAR) 100-12.5 MG tablet           Outstanding Labs/Studies   Orthostatic BPs BMP  Duration of Discharge Encounter   Greater than 30 minutes including physician time.  Signed, Cecilie Kicks NP 05/29/2017, 9:54 AM  Personally seen and examined. Agree with above.  71 year old with coronary artery disease status post DES to LAD who yesterday was prepared for  discharge however developed orthostatic hypotension.  Her amlodipine was originally increased during this hospitalization to 10 mg because of hypertension.  During these episodes of hypotension, IV fluids were administered,  bolus form, and amlodipine was actually discontinued.  She remained on low-dose beta-blocker.  She is improved today.  Feeling better.  No signs of bleeding.  Hemoglobin stable.  Radial artery catheterization site.  She is ready for discharge.  Ambulating well.  Unremarkable exam.  We will have close follow-up.  Candee Furbish, MD

## 2017-05-28 NOTE — Care Management Note (Signed)
Case Management Note  Patient Details  Name: [name] MRN: [id number] Date of Birth: November 20, 1945  Subjective/Objective:  New CM consult for Brilinta. Pt given 30 day free card and instructions to follow up with PCP or Cardiologists within 2 weeks post hospital stay for Prior Auth or samples if needed. Stressed the importance of medication and keeping supply on hand. pt verbalized understanding. No additional CM needs at this time.                Action/Plan:CM will sign off for now but will be available should additional discharge needs arise or disposition change.    Expected Discharge Date:                  Expected Discharge Plan:  Home/Self Care  In-House Referral:  NA  Discharge planning Services  Medication Assistance, CM Consult  Post Acute Care Choice:  NA Choice offered to:  Patient  DME Arranged:  N/A DME Agency:  NA  HH Arranged:  NA HH Agency:  NA  Status of Service:  Completed, signed off  If discussed at [place] of Stay Meetings, dates discussed:    Additional Comments:  Delrae Sawyers, RN 05/28/2017, 9:04 AM

## 2017-05-28 NOTE — Progress Notes (Signed)
Progress Note  Patient Name: [name] Date of Encounter: 05/28/2017  Primary Cardiologist: Dr. Harrington Challenger  Subjective   Hypotension with weakness with activity, but overall is feeling better currently.  She usually does not feel hypotension at home.  Inpatient Medications    Scheduled Meds: . amLODipine  10 mg Oral Daily  . aspirin  81 mg Oral Daily  . famotidine  20 mg Oral BID  . gabapentin  300 mg Oral QHS  . insulin aspart  0-15 Units Subcutaneous TID WC  . insulin aspart  0-5 Units Subcutaneous QHS  . multivitamin with minerals  1 tablet Oral Daily  . polyethylene glycol  17 g Oral Daily  . sodium chloride flush  3 mL Intravenous Q12H  . ticagrelor  90 mg Oral BID   Continuous Infusions: . sodium chloride     PRN Meds: sodium chloride, acetaminophen, ALPRAZolam, cyclobenzaprine, fluticasone, nitroGLYCERIN, ondansetron (ZOFRAN) IV, sodium chloride flush, zolpidem   Vital Signs    Vitals:   05/27/17 2345 05/28/17 0045 05/28/17 0300 05/28/17 0438  BP:  113/67 (!) 85/59   Pulse:  (!) 114 (!) 114   Resp:  17 19   Temp: 98.7 F (37.1 C)   99.3 F (37.4 C)  TempSrc: Oral   Oral  SpO2:  100% 93%   Weight:      Height:        Intake/Output Summary (Last 24 hours) at 05/28/17 0718 Last data filed at 05/27/17 2345  Gross per 24 hour  Intake          1003.75 ml  Output             2400 ml  Net         -1396.25 ml   Filed Weights   05/25/17 0601 05/26/17 0556 05/27/17 0458  Weight: 200 lb (90.7 kg) 212 lb 6.4 oz (96.3 kg) 210 lb 4.8 oz (95.4 kg)    Telemetry    SR to ST - Personally Reviewed  ECG    SR LAD, slurred ST in V4-6 - Personally Reviewed  Physical Exam   GEN: No acute distress.  Lightheaded when standing, currently resolved Neck: No JVD Cardiac: RRR, no murmurs, rubs, or gallops. Rt radial cath without hematoma Respiratory: Clear to auscultation bilaterally. GI: Soft, nontender, non-distended  MS: No edema; No deformity. Neuro:  Nonfocal    Psych: Normal affect   Labs    Chemistry Recent Labs Lab 05/25/17 0602 05/26/17 0418 05/27/17 0845  NA 139 132* 133*  K 3.5 3.5 4.0  CL 105 99* 102  CO2 26 22 20*  GLUCOSE 97 152* 115*  BUN 16 11 9   CREATININE 1.14* 0.91 1.16*  CALCIUM 9.9 9.4 9.3  PROT 7.8  --   --   ALBUMIN 3.7  --   --   AST 25  --   --   ALT 17  --   --   ALKPHOS 76  --   --   BILITOT 0.5  --   --   GFRNONAA 47* >60 46*  GFRAA 54* >60 53*  ANIONGAP 8 11 11      Hematology Recent Labs Lab 05/25/17 0602  WBC 8.1  RBC 4.63  HGB 13.8  HCT 40.9  MCV 88.3  MCH 29.8  MCHC 33.7  RDW 15.5  PLT 268    Cardiac Enzymes Recent Labs Lab 05/25/17 1028  TROPONINI <0.03    Recent Labs Lab 05/25/17 0609  TROPIPOC 0.00  BNPNo results for input(s): BNP, PROBNP in the last 168 hours.   DDimer No results for input(s): DDIMER in the last 168 hours.   Radiology    Ct Coronary Morph W/cta Cor W/score W/ca W/cm &/or Wo/cm  Addendum Date: 05/27/2017   ADDENDUM REPORT: 05/27/2017 14:25 EXAM: FF/RCT ANALYSIS FINDINGS: FFRct analysis was performed on the original cardiac CT angiogram dataset. Diagrammatic representation of the FFRct analysis is provided in a separate PDF document in PACS. This dictation was created using the PDF document and an interactive 3D model of the results. 3D model is not available in the EMR/PACS. Normal FFR range is >0.80. 1. Left Main:  No significant stenosis. 2. LAD: No significant stenosis. 3. Ramus intermedius: Proximal FFR: 0.95, mid FFR 0.82, distal FFR 0.78. 4. LCX: No significant stenosis in the proximal portion. Proximal to mid portion is occluded. 5. RCA: No significant stenosis. IMPRESSION: 1. CT FFR analysis showed significant stenosis in the distal ramus intermedius and occluded LCX after the proximal segment. Electronically Signed   By: Ena Dawley   On: 05/27/2017 14:25   Addendum Date: 05/26/2017   ADDENDUM REPORT: 05/26/2017 17:23 CLINICAL DATA:   71 year old female with history of HTN, DM and chest pain. Negative nuclear stress test. EXAM: Cardiac/Coronary  CT TECHNIQUE: The patient was scanned on a [employer]. FINDINGS: A 120 kV prospective scan was triggered in the descending thoracic aorta at 111 HU's. Axial non-contrast 3 mm slices were carried out through the heart. The data set was analyzed on a dedicated work station and scored using the [place]. Gantry rotation speed was 250 msecs and collimation was .6 mm. 10 mg of iv Metoprolol and 0.8 mg of sl NTG was given. The 3D data set was reconstructed in 5% intervals of the 67-82 % of the R-R cycle. Diastolic phases were analyzed on a dedicated work station using MPR, MIP and VRT modes. The patient received 80 cc of contrast. Aorta:  Normal size.  Mild diffuse calcifications.  No dissection. Aortic Valve:  Trileaflet.  No calcifications. Coronary Arteries:  Normal coronary origin.  Right dominance. RCA is a large dominant artery that gives rise to PDA and PLVB. There is minimal non-obstructive plaque. Left main is a large and long artery that gives rise to LAD, ramus intermedius and LCX arteries. Left main has no plaque. LAD is a large tortuous vessel that wraps around the apex and has minimal diffuse calcified plaque with stenosis 0-25%. RI is a medium size tortuous vessel that has severe calcified plaque in the mid portion with at least 50-69% stenosis but possibly > 70% stenosis. LCX is a medium size non-dominant artery that is only visualized in the proximal portion. This possibly represents complete occlusion. Other findings: Normal pulmonary vein drainage into the left atrium. Normal let atrial appendage without a thrombus. Mildly dilated pulmonary artery measuring 34 mm. IMPRESSION: 1. Coronary calcium score of 131. This was 29 percentile for age and sex matched control. 2. Normal coronary origin with right dominance. 3. A possible severe stenosis in the medium size ramus  intermedius and complete occlusion of the LCX artery at the proximal to mid portion. Additional analysis with CT FFR will be performed. 4. Mildly dilated pulmonary artery measuring 34 mm suggestive of pulmonary hypertension. Ena Dawley Electronically Signed   By: Ena Dawley   On: 05/26/2017 17:23   Result Date: 05/27/2017 EXAM: OVER-READ INTERPRETATION  CT CHEST The following report is an over-read performed by radiologist Dr. Rebekah Chesterfield  [facility], [place] on 05/26/2017. This over-read does not include interpretation of cardiac or coronary anatomy or pathology. The coronary CTA interpretation by the cardiologist is attached. COMPARISON:  Chest CT 05/25/2017. FINDINGS: Compared to the recent prior chest CT there are areas of increasing cylindrical bronchiectasis throughout the right middle lobe and lower lobes of the lungs bilaterally, with surrounding areas of thickening of the peribronchovascular interstitium an linear areas of architectural distortion. This is favored to reflect some evolving infectious changes, although some of these findings may be chronic. No confluent airspace consolidation is noted. No pleural effusions. No pneumothorax in the visualized thorax. Small right-sided pleural lipoma is unchanged. Prominent soft tissue in the anterior mediastinum, similar to the prior examination dating back to at least 02/25/2011, presumably benign thymic tissue. No lymphadenopathy noted in the visualized portions of the thorax. Visualized portions of the upper abdomen are unremarkable. There are no aggressive appearing lytic or blastic lesions noted in the visualized portions of the skeleton. IMPRESSION: 1. As above, there are what appear to be evolving infectious/inflammatory changes throughout the lung bases, most evident in the in the lower lobes of lungs bilaterally. Although there is no frank airspace consolidation identified at this time, findings are concerning either for sequela  of recent aspiration, severe bronchitis, or developing multilobar bronchopneumonia. Electronically Signed: By: Vinnie Langton M.D. On: 05/26/2017 16:13    Cardiac Studies   Procedures   CORONARY STENT INTERVENTION  LEFT HEART CATH AND CORONARY ANGIOGRAPHY  Conclusion     Mid RCA lesion, 20 %stenosed.  A STENT SYNERGY DES 3X28 drug eluting stent was successfully placed.  Mid Cx lesion, 100 %stenosed.  Post intervention, there is a 0% residual stenosis.  Ramus lesion, 50 %stenosed.  Dist LAD lesion, 30 %stenosed.  Prox LAD to Mid LAD lesion, 20 %stenosed.  Mid LAD lesion, 20 %stenosed.   1. Mild non-obstructive disease in the LAD and RCA 2. Moderate stenosis in the distal segment of the moderate caliber intermediate branch 3. Chronic total occlusion of the mid Circumflex artery. There is dye staining in the occluded segment suggesting sub-acute lesion. The distal segment and OM branch fills from left to left collaterals slowly.  4. Successful PTCA/DES x 1 mid Circumflex artery  Recommendations: Will continue DAPT with ASA and Brilinta. She does not have a true ASA allergy. High dose statin. Continue beta blocker.     ECHO 05/27/17 Study Conclusions  - Left ventricle: The cavity size was normal. There was mild   concentric hypertrophy. Systolic function was vigorous. The   estimated ejection fraction was in the range of 65% to 70%. Wall   motion was normal; there were no regional wall motion   abnormalities. Doppler parameters are consistent with abnormal   left ventricular relaxation (grade 1 diastolic dysfunction). - Tricuspid valve: There was trivial regurgitation.  Impressions:  - Compared to the prior study, there has been no significant   interval change.   Cardiac CT   FINDINGS: FFRct analysis was performed on the original cardiac CT angiogram dataset. Diagrammatic representation of the FFRct analysis is provided in a separate PDF document in  PACS. This dictation was created using the PDF document and an interactive 3D model of the results. 3D model is not available in the EMR/PACS. Normal FFR range is >0.80.  1. Left Main:  No significant stenosis.  2. LAD: No significant stenosis. 3. Ramus intermedius: Proximal FFR: 0.95, mid FFR 0.82, distal FFR 0.78. 4. LCX: No significant stenosis in the  proximal portion. Proximal to mid portion is occluded. 5. RCA: No significant stenosis.  IMPRESSION: 1. CT FFR analysis showed significant stenosis in the distal ramus intermedius and occluded LCX after the proximal segment.    Patient Profile     71 y.o. female with history of HTN, DM, 1 kidney (s/p nephrectomy), RA  Admitted with CP    Assessment & Plan    Unstable angina --neg CT angio of PE or dissection, Cardiac CT with with FFR + of ramus -cardiac cath done.  Neg MI  CAD with LCX disease 100% sub-acute lesion and DES to lesion.  ASA and Brilinta X 1 yr.  High dose statin,  No BB currently due to hypotension'  May need to keep today and give fluids.   Hypotension today -- BP down to 86 systolic then back up,   labs pending also  Was hypertensive prior to cath.  orthostatic BP with drop from 580/998 systolic to 33/82 ,   Also HR tachy especially with ambulation  CKD  Hx nephrectomy  Labs pending  HLD    Intolerant to statin will send to lipid clinic for repatha  DM  Diet controlled  Glucose stable here   WCT - not on BB will decrease amlodipine and add BB   For questions or updates, please contact [facility] Please consult [email or web address] for contact info under Cardiology/STEMI.      Signed, Cecilie Kicks, NP  05/28/2017, 7:18 AM    Personally seen and examined. Agree with above.  71 year old female with unstable angina, coronary artery disease with 100% subacute lesion in the left circumflex, DES placed, the dual antiplatelet therapy including Brilinta for 1 year.  Earlier this morning, blood pressure was  fairly low.  Somewhat lightheaded.  This has resolved.  This may have been a result of increasing her amlodipine during this hospitalization because of hypertension noted earlier.  Exam: Catheterization site normal, with no evidence of hematoma, radial site.  Normal pulse.  Lungs are clear normal respiration, abdomen soft, heart regular rate and rhythm no murmurs.  No edema.  Labs: Hemoglobin stable, creatinine stable from last month.  1.21.  Unstable angina/CAD - DES circumflex, dual antiplatelet therapy.  Radial artery site precautions discussed.  Hypotension -May have been in part iatrogenic from increasing her antihypertensives when she was noted to be hypertensive earlier during this admission.  Perhaps the hypertension was in the setting of anxieties cardiac catheterization.  Nonetheless, this has resolved.  We will resume her home dose of amlodipine which is 5 mg.  We will also give her a 250 mL bolus of fluids.  TED hose as well.  If she is ambulating well without any difficulty, she may go home a little later today.  Statin intolerance/hyperlipidemia -She deserves to be on statin therapy given her underlying coronary artery disease.  We will trial Crestor 5 mg once a day.  We will also send her to lipid clinic given her prior intolerances including intolerances to pravastatin.  She states that it makes her joints ache.  She also has a history of rheumatoid arthritis.  CKD/nephrectomy -Single kidney.  Hydration precautions.  Avoid NSAIDs.  As long as blood pressure stable and she is asymptomatic, she may be discharged later today.  We will have close follow-up in clinic.  Candee Furbish, MD

## 2017-05-28 NOTE — Progress Notes (Signed)
Orthostatic VS for the past 24 hrs:  BP- Lying Pulse- Lying BP- Sitting Pulse- Sitting BP- Standing at 0 minutes Pulse- Standing at 0 minutes  05/28/17 1331 111/56 93 96/56 93 96/72 106  05/28/17 0715 (!) 151/109 104 (!) 75/47 109 (!) 73/50 125    Reviewed with Dr. Marlou Porch. She felt poor with ambulation. Will given another bolus of 250cc. Stop amlodipine. Continue low dose BB. Reassess in AM. Patient and family agree with plan.

## 2017-05-28 NOTE — Progress Notes (Signed)
Patient ambulated to bathroom.  When patient returned from bathroom c/o of feeling weak and a little dizzy. Checked VS BP 86/48, RA 100%, HR 115.  Assisted patient back to bed.  Started fluid bolus, will continue to monitor.

## 2017-05-29 DIAGNOSIS — I951 Orthostatic hypotension: Secondary | ICD-10-CM

## 2017-05-29 LAB — CBC
HCT: 37.2 % (ref 36.0–46.0)
Hemoglobin: 12.5 g/dL (ref 12.0–15.0)
MCH: 29.4 pg (ref 26.0–34.0)
MCHC: 33.6 g/dL (ref 30.0–36.0)
MCV: 87.5 fL (ref 78.0–100.0)
Platelets: 228 10*3/uL (ref 150–400)
RBC: 4.25 MIL/uL (ref 3.87–5.11)
RDW: 15.4 % (ref 11.5–15.5)
WBC: 12.5 10*3/uL — ABNORMAL HIGH (ref 4.0–10.5)

## 2017-05-29 LAB — BASIC METABOLIC PANEL
Anion gap: 8 (ref 5–15)
BUN: 18 mg/dL (ref 6–20)
CO2: 22 mmol/L (ref 22–32)
Calcium: 8.9 mg/dL (ref 8.9–10.3)
Chloride: 108 mmol/L (ref 101–111)
Creatinine, Ser: 1.33 mg/dL — ABNORMAL HIGH (ref 0.44–1.00)
GFR calc Af Amer: 45 mL/min — ABNORMAL LOW (ref >60)
GFR calc non Af Amer: 39 mL/min — ABNORMAL LOW (ref >60)
Glucose, Bld: 121 mg/dL — ABNORMAL HIGH (ref 65–99)
Potassium: 3.5 mmol/L (ref 3.5–5.1)
Sodium: 138 mmol/L (ref 135–145)

## 2017-05-29 LAB — GLUCOSE, CAPILLARY
Glucose-Capillary: 88 mg/dL (ref 65–99)
Glucose-Capillary: 99 mg/dL (ref 65–99)

## 2017-05-29 NOTE — Discharge Instructions (Signed)
Call [facility] at [phone] if any bleeding, swelling or drainage at cath site.  May shower, no tub baths for 48 hours for groin sticks. No lifting over 5 pounds for 3 days.  No Driving for 3 days   For chest pain: Take 1 NTG, under your tongue, while sitting.  If no relief of pain may repeat NTG, one tab every 5 minutes up to 3 tablets total over 15 minutes.  If no relief CALL 911.  If you have dizziness/lightheadness  while taking NTG, stop taking and call 911.        Heart Healthy Diabetic Diet   Do not stop ASA and Brilinta.  These are to keep your stent open.     Wear your stockings to help prevent BP from dropping when you get up.  Also stand by chair or bed for 5 min with standing.  Call if continued lightheadedness.

## 2017-05-29 NOTE — Progress Notes (Signed)
Progress Note  Patient Name: [name] Date of Encounter: 05/29/2017  Primary Cardiologist: Dr. Harrington Challenger  Subjective   No chest pain and no SOB, sitting up in bed without lightheadedness.   Inpatient Medications    Scheduled Meds: . aspirin  81 mg Oral Daily  . famotidine  20 mg Oral BID  . gabapentin  300 mg Oral QHS  . insulin aspart  0-15 Units Subcutaneous TID WC  . insulin aspart  0-5 Units Subcutaneous QHS  . metoprolol tartrate  12.5 mg Oral BID  . multivitamin with minerals  1 tablet Oral Daily  . polyethylene glycol  17 g Oral Daily  . rosuvastatin  5 mg Oral q1800  . sodium chloride flush  3 mL Intravenous Q12H  . ticagrelor  90 mg Oral BID   Continuous Infusions: . sodium chloride     PRN Meds: sodium chloride, acetaminophen, ALPRAZolam, cyclobenzaprine, fluticasone, nitroGLYCERIN, ondansetron (ZOFRAN) IV, sodium chloride flush, zolpidem   Vital Signs    Vitals:   05/28/17 2025 05/28/17 2046 05/28/17 2209 05/29/17 0628  BP:  (!) 87/63 104/67 103/67  Pulse:  98 (!) 108 92  Resp:  (!) 24  16  Temp: 98.6 F (37 C) 99.9 F (37.7 C)  99.9 F (37.7 C)  TempSrc:  Oral  Oral  SpO2: 96% 96%  98%  Weight:    211 lb 3.2 oz (95.8 kg)  Height:        Intake/Output Summary (Last 24 hours) at 05/29/17 0718 Last data filed at 05/28/17 2200  Gross per 24 hour  Intake              860 ml  Output                0 ml  Net              860 ml   Filed Weights   05/26/17 0556 05/27/17 0458 05/29/17 0628  Weight: 212 lb 6.4 oz (96.3 kg) 210 lb 4.8 oz (95.4 kg) 211 lb 3.2 oz (95.8 kg)    Telemetry    SR - Personally Reviewed  ECG    No new - Personally Reviewed  Physical Exam   GEN: No acute distress.   Neck: No JVD Cardiac: RRR, no murmurs, rubs, or gallops. Rt radial cath site without hematoma Respiratory: Clear to auscultation bilaterally. GI: Soft, nontender, non-distended  MS: No edema; No deformity. Neuro:  Nonfocal  Psych: Normal affect    Labs    Chemistry Recent Labs Lab 05/25/17 0602 05/26/17 0418 05/27/17 0845 05/28/17 0650  NA 139 132* 133* 134*  K 3.5 3.5 4.0 3.6  CL 105 99* 102 101  CO2 26 22 20* 22  GLUCOSE 97 152* 115* 102*  BUN 16 11 9 11   CREATININE 1.14* 0.91 1.16* 1.25*  CALCIUM 9.9 9.4 9.3 9.2  PROT 7.8  --   --   --   ALBUMIN 3.7  --   --   --   AST 25  --   --   --   ALT 17  --   --   --   ALKPHOS 76  --   --   --   BILITOT 0.5  --   --   --   GFRNONAA 47* >60 46* 42*  GFRAA 54* >60 53* 48*  ANIONGAP 8 11 11 11      Hematology Recent Labs Lab 05/25/17 0602 05/28/17 0650  WBC 8.1 14.2*  RBC 4.63 4.88  HGB 13.8 14.4  HCT 40.9 42.1  MCV 88.3 86.3  MCH 29.8 29.5  MCHC 33.7 34.2  RDW 15.5 15.6*  PLT 268 250    Cardiac Enzymes Recent Labs Lab 05/25/17 1028  TROPONINI <0.03    Recent Labs Lab 05/25/17 0609  TROPIPOC 0.00     BNPNo results for input(s): BNP, PROBNP in the last 168 hours.   DDimer No results for input(s): DDIMER in the last 168 hours.   Radiology    No results found.  Cardiac Studies   Procedures   CORONARY STENT INTERVENTION  LEFT HEART CATH AND CORONARY ANGIOGRAPHY  Conclusion     Mid RCA lesion, 20 %stenosed.  A STENT SYNERGY DES 3X28 drug eluting stent was successfully placed.  Mid Cx lesion, 100 %stenosed.  Post intervention, there is a 0% residual stenosis.  Ramus lesion, 50 %stenosed.  Dist LAD lesion, 30 %stenosed.  Prox LAD to Mid LAD lesion, 20 %stenosed.  Mid LAD lesion, 20 %stenosed.  1. Mild non-obstructive disease in the LAD and RCA 2. Moderate stenosis in the distal segment of the moderate caliber intermediate branch 3. Chronic total occlusion of the mid Circumflex artery. There is dye staining in the occluded segment suggesting sub-acute lesion. The distal segment and OM branch fills from left to left collaterals slowly.  4. Successful PTCA/DES x 1 mid Circumflex artery  Recommendations: Will continue DAPT with  ASA and Brilinta. She does not have a true ASA allergy. High dose statin. Continue beta blocker.     ECHO 05/27/17 Study Conclusions  - Left ventricle: The cavity size was normal. There was mild concentric hypertrophy. Systolic function was vigorous. The estimated ejection fraction was in the range of 65% to 70%. Wall motion was normal; there were no regional wall motion abnormalities. Doppler parameters are consistent with abnormal left ventricular relaxation (grade 1 diastolic dysfunction). - Tricuspid valve: There was trivial regurgitation.  Impressions:  - Compared to the prior study, there has been no significant interval change.   Cardiac CT   FINDINGS: FFRct analysis was performed on the original cardiac CT angiogram dataset. Diagrammatic representation of the FFRct analysis is provided in a separate PDF document in PACS. This dictation was created using the PDF document and an interactive 3D model of the results. 3D model is not available in the EMR/PACS. Normal FFR range is >0.80.  1. Left Main: No significant stenosis.  2. LAD: No significant stenosis. 3. Ramus intermedius: Proximal FFR: 0.95, mid FFR 0.82, distal FFR 0.78. 4. LCX: No significant stenosis in the proximal portion. Proximal to mid portion is occluded. 5. RCA: No significant stenosis.  IMPRESSION: 1. CT FFR analysis showed significant stenosis in the distal ramus intermedius and occluded LCX after the proximal segment.     Patient Profile     71 y.o. female with history of HTN, DM, 1 kidney (s/p nephrectomy), RA Admitted with CP,  Neg MI and +CAD with LCX sub acute lesion undergoing DES to lesion.   Assessment & Plan    Unstable angina --neg CT angio of PE or dissection, Cardiac CT with with FFR + of ramus -cardiac cath done.  Neg MI  No further chest pain   CAD with LCX disease 100% sub-acute lesion and DES to lesion.  ASA and Brilinta X 1 yr.  has been  intolerant to statin,  she has not tried crestor and she agreed to try.    Hypotension Orthostatic -- BP down to 86  systolic then back up,  Was hypertensive prior to cath.  orthostatic BP with drop from 149/702 systolic to 63/78 on 58/85/02 -  given total of 1000 cc on NS yesterday and TED stockings.  She is symptomatic with dizziness.  Her amlodipine was held yesterday but 12.5 mg BB given.    Also HR tachy especially with ambulation.  Today doing well -will check orthostatic BP -off her amlodipine 5 mg and losartan/hctz 100/12.5  currently on metoprolol 12.5 BID   CKD  Hx nephrectomy  Labs pending  HLD    Intolerant to statin will send to lipid clinic for repatha  DM  Diet controlled  Glucose stable here on SSI   WCT - added BB  For questions or updates, please contact [facility] Please consult [email or web address] for contact info under Cardiology/STEMI.      Signed, Rose Kicks, NP  05/29/2017, 7:18 AM    Personally seen and examined. Agree with above.  71 year old with coronary artery disease status post DES to LAD who yesterday was prepared for discharge however developed orthostatic hypotension.  Her amlodipine was originally increased during this hospitalization to 10 mg because of hypertension.  During these episodes of hypotension, IV fluids were administered, bolus form, and amlodipine was actually discontinued.  She remained on low-dose beta-blocker.  She is improved today.  Feeling better.  No signs of bleeding.  Hemoglobin stable.  Radial artery catheterization site.  She is ready for discharge.  Ambulating well.  Unremarkable exam.  We will have close follow-up.  Rose Furbish, MD

## 2017-05-29 NOTE — Progress Notes (Signed)
Pt discharged home. PIVs removed. AVS reviewed. Pt left unit via wheelchair, accompanied by CNA, belongings and prescriptions in hand. Pt to be transported home by daughter.

## 2017-05-29 NOTE — Progress Notes (Signed)
Paged and spoke with Dr Maudie Mercury re: continuous low B/P 90's/50-60's, will give another 500cc NS bolus and continue to monitor. Pt is slightly dizzy with quick change of position but no other symptoms, will assist OOB to BR as needed and continue to monitor.

## 2017-05-30 ENCOUNTER — Encounter (HOSPITAL_COMMUNITY): Payer: Self-pay | Admitting: Cardiovascular Disease

## 2017-05-30 ENCOUNTER — Telehealth (HOSPITAL_COMMUNITY): Payer: Self-pay

## 2017-05-30 MED FILL — Lidocaine HCl Local Inj 2%: INTRAMUSCULAR | Qty: 20 | Status: AC

## 2017-05-30 MED FILL — Nitroglycerin IV Soln 100 MCG/ML in D5W: INTRA_ARTERIAL | Qty: 10 | Status: AC

## 2017-05-30 NOTE — Telephone Encounter (Signed)
Patients insurance is active and benefits verified with [facility] - $45.00 co-pay, no deductible, out of pocket amount is $5,900/$1,687.62 has been met, no co-insurance, and no pre-authorization is required. Passport/reference [phone]

## 2017-05-31 ENCOUNTER — Telehealth: Payer: Self-pay | Admitting: Physician Assistant

## 2017-05-31 NOTE — Telephone Encounter (Signed)
I returned pt's call from earlier who states she is calling because her right ankle is a little swollen. I asked pt to push in on her ankle and when she removes her finger does it leave an indentation. Pt answered no indentation left. I confirmed with the pt she has had no chest pain, sob. Pt states her weight is still the same and she has not gained any weight.  I did ask pt if she has had any fevers, which pt answered yes today when she woke up. Pt states she saw Rheumatologist the other day and did lab work work. Pt states she was told by Rheumatologist she may have gout. Pt states she does also have Neuropathy. Pt states when she steps down on the right ankle it hurts. I asked pt has she called PCP yet, pt answered no. I advised pt to call PCP for further advice as well. I will d/w PA as well. Pt thanked me for my call back today.

## 2017-05-31 NOTE — Telephone Encounter (Signed)
Agree.  She should have her ankle evaluated by primary care. Richardson Dopp, PA-C    05/31/2017 1:24 PM

## 2017-05-31 NOTE — Telephone Encounter (Signed)
Pt c/o swelling: STAT is pt has developed SOB within 24 hours  1) How much weight have you gained and in what time span? no  2) If swelling, where is the swelling located? Rt foot  3) Are you currently taking a fluid pill? no  4) Are you currently SOB? no  5) Do you have a log of your daily weights (if so, list)? no  6) Have you gained 3 pounds in a day or 5 pounds in a week? unknown  7) Have you traveled recently? no

## 2017-06-09 NOTE — Progress Notes (Signed)
Cardiology Office Note    Date:  06/10/2017   ID:  [name], DOB Nov 13, 1945, MRN [id number]  PCP:  [name], [name], MD  Cardiologist:  Dr. Harrington Challenger  Chief Complaint: Hospital follow up  History of Present Illness:   [name] is a 71 y.o. female with hx of HTN, DM, 1 kidney (s/p nephrectomy) and RA presents for follow up.   Admitted 05/2017 for symptoms concerning for unstable angina. CTA negative for PE or dissection. Cardiac CT with FFR positive for Ramus. Cath showed 100% sub acute lesion to mid circumflex artery s/p DES. Echo showed normal 65-70% LVEF with grade 1 DD. Post cath patient had orthostatic hypotension. Decreased amlodipine and low dose BB added. Symptoms improved after bolus of fluids. Prior statin intolerance however Crestor added (has not tried before).  Patient is here for follow-up.  Tolerating Crestor without myalgia.  Patient has intermittent shortness of breath mostly with walking and after taking Brilinta.  This is improving.  Compliant with medication.  No chest pain, orthopnea, PND, syncope or lower extremity edema.  She has quit smoking.  Her dizziness has improved significantly.  She gets her balance first when trying to stand up or move.  She is planning to start cardiac rehab phase 2 and water aerobics.   Past Medical History:  Diagnosis Date  . Allergy   . Arthritis    RA (Dr. Ouida Sills)  . C. difficile diarrhea    history of  . CAD (coronary artery disease), native coronary artery    a. Cardiac CT with + FFR for Ramus b. cath 05/2017 100% sub acute lesion to mid circumflex artery s/p DES.  . CKD (chronic kidney disease) stage 3, GFR 30-59 ml/min ([place]) 10/11/2016   s/p R nephrectomy  . Colon polyps 2008   HYPERPLASTIC  . Costochondritis    a. Nuc Stress Test 6/16: EF 70, no scar or ischemia, low risk // b. Echo 3/16: mild conc LVH, EF 65-70, no RWMA, Gr 1 DD, mild TR  . Diabetes mellitus without complication ([facility])    type 2  . Gastritis   . GERD  (gastroesophageal reflux disease)   . History of echocardiogram    Echo 3/18:  Moderate LVH, EF 62-37, grade 1 diastolic dysfunction, calcified aortic valve, mild MR, moderate LAE  . History of nuclear stress test    Myoview 3/18: Mod size and intensity fixed septal defect, may be artifact.Opposite mod size and intensity lat defect, which is reversible and could represent ischemia or possibly artifact (SDS 4). LVEF 71% with normal wall motion. Intermediate risk study. >> images reviewed with Dr. Dorris Carnes - no sig ischemia; med rx   . History of pneumonia   . Hx of cardiovascular stress test    Lexiscan Myoview 6/16:  EF 70%, no scar or ischemia; Low Risk  . Hypertension   . Neuropathy   . Orthostatic hypotension 05/28/2017  . Osteoarthritis   . S/P angioplasty with stent 05/27/17 to LCX with DES  05/28/2017  . Thyroid disease     Past Surgical History:  Procedure Laterality Date  . ABDOMINAL HYSTERECTOMY  1978  . BACK SURGERY     x 2  . CARPAL TUNNEL RELEASE Left   . COLONOSCOPY W/ BIOPSIES AND POLYPECTOMY     Hx: of  . HNP    . NEPHRECTOMY Right 2010   10.rcc cancer  . TOTAL KNEE ARTHROPLASTY Right    Redo    Current Medications: Prior  to Admission medications   Medication Sig Start Date End Date Taking? Authorizing Provider  acetaminophen (TYLENOL) 500 MG tablet Take 500 mg by mouth 2 (two) times daily as needed (pain).    [provider]  aspirin 81 MG chewable tablet Chew 1 tablet (81 mg total) by mouth daily. 05/28/17   Isaiah Serge, NP  cyclobenzaprine (FLEXERIL) 5 MG tablet Take 1 tablet (5 mg total) by mouth 3 (three) times daily as needed for muscle spasms. 06/08/16   Dorena Cookey, MD  fluticasone (FLONASE) 50 MCG/ACT nasal spray Place 1 spray into both nostrils 2 (two) times daily as needed for allergies. 06/08/16   Dorena Cookey, MD  gabapentin (NEURONTIN) 300 MG capsule Take 1 capsule (300 mg total) by mouth at bedtime. 03/16/17   Penumalli, Earlean Polka,  MD  metoprolol tartrate (LOPRESSOR) 25 MG tablet Take 0.5 tablets (12.5 mg total) by mouth 2 (two) times daily. 05/28/17   Isaiah Serge, NP  mirabegron ER (MYRBETRIQ) 25 MG TB24 tablet Take 25 mg by mouth as needed.    [provider]  Multiple Vitamin (MULTIVITAMIN) tablet Take 1 tablet by mouth daily.    [provider]  nitroGLYCERIN (NITROSTAT) 0.4 MG SL tablet Place 1 tablet (0.4 mg total) under the tongue every 5 (five) minutes as needed for chest pain. 05/28/17   Isaiah Serge, NP  NONFORMULARY OR COMPOUNDED ITEM Rx for compounded cream faxed to [place]    [provider]  polyethylene glycol powder (GLYCOLAX/MIRALAX) powder Take 17 g by mouth daily. 05/03/17   Levin Erp, PA  ranitidine (ZANTAC) 150 MG tablet Take 1 tablet (150 mg total) by mouth 2 (two) times daily. 11/01/16   [name], [name], MD  rosuvastatin (CRESTOR) 5 MG tablet Take 1 tablet (5 mg total) by mouth daily at 6 PM. 05/28/17   Isaiah Serge, NP  ticagrelor (BRILINTA) 90 MG TABS tablet Take 1 tablet (90 mg total) by mouth 2 (two) times daily. 05/28/17   Isaiah Serge, NP    Allergies:   Percocet [oxycodone-acetaminophen]; Pravachol [pravastatin sodium]; Hydrocodone; and Aspirin   Social History   Socioeconomic History  . Marital status: Divorced    Spouse name: Not on file  . Number of children: 3  . Years of education: 22  . Highest education level: Not on file  Social Needs  . Financial resource strain: Not on file  . Food insecurity - worry: Not on file  . Food insecurity - inability: Not on file  . Transportation needs - medical: Not on file  . Transportation needs - non-medical: Not on file  Occupational History  . Occupation: Retired    Fish farm manager: RETIRED  Tobacco Use  . Smoking status: Former Smoker    Years: 15.00    Types: Cigarettes    Last attempt to quit: 05/27/2017    Years since quitting: 0.0  . Smokeless tobacco: Never Used  . Tobacco  comment: 03/16/17  1 pk/week  Substance and Sexual Activity  . Alcohol use: No    Alcohol/week: 0.0 oz  . Drug use: No  . Sexual activity: Not on file  Other Topics Concern  . Not on file  Social History Narrative   Single, dgtr lives with her   Never Smoked    Alcohol use- no   Drug use-no   Regular Exercise-yes   Former Smoker- 12/2008     Family History:  The patient's family history includes Cancer in  her brother; Dementia in her brother and brother; Diabetes in her brother and brother; Heart disease in her father; Kidney disease in her son; Parkinsonism in her brother; Prostate cancer in her father; Rheum arthritis in her mother; Stroke in her mother.   ROS:   Please see the history of present illness.    ROS All other systems reviewed and are negative.   PHYSICAL EXAM:   VS:  BP 134/78   Pulse 74   Ht 5\' 6"  (1.676 m)   Wt 212 lb 1.9 oz (96.2 kg)   SpO2 98%   BMI 34.24 kg/m    GEN: Well nourished, well developed, in no acute distress  HEENT: normal  Neck: no JVD, carotid bruits, or masses Cardiac: RRR; no murmurs, rubs, or gallops,no edema  Respiratory:  clear to auscultation bilaterally, normal work of breathing GI: soft, nontender, nondistended, + BS MS: no deformity or atrophy  Skin: warm and dry, no rash Neuro:  Alert and Oriented x 3, Strength and sensation are intact Psych: euthymic mood, full affect  Wt Readings from Last 3 Encounters:  06/10/17 212 lb 1.9 oz (96.2 kg)  05/29/17 211 lb 3.2 oz (95.8 kg)  05/03/17 212 lb (96.2 kg)      Studies/Labs Reviewed:   EKG:  EKG is not ordered today.    Recent Labs: 10/01/2016: Brain Natriuretic Peptide 14.0 12/01/2016: TSH 2.360 05/25/2017: ALT 17 05/29/2017: BUN 18; Creatinine, Ser 1.33; Hemoglobin 12.5; Platelets 228; Potassium 3.5; Sodium 138   Lipid Panel    Component Value Date/Time   CHOL 260 (H) 03/08/2017 0935   TRIG 108.0 03/08/2017 0935   HDL 66.30 03/08/2017 0935   CHOLHDL 4 03/08/2017 0935    VLDL 21.6 03/08/2017 0935   LDLCALC 172 (H) 03/08/2017 0935   LDLDIRECT 171.5 12/06/2011 0812    Additional studies/ records that were reviewed today include:    Procedures   CORONARY STENT INTERVENTION  LEFT HEART CATH AND CORONARY ANGIOGRAPHY  Conclusion     Mid RCA lesion, 20 %stenosed.  A STENT SYNERGY DES 3X28 drug eluting stent was successfully placed.  Mid Cx lesion, 100 %stenosed.  Post intervention, there is a 0% residual stenosis.  Ramus lesion, 50 %stenosed.  Dist LAD lesion, 30 %stenosed.  Prox LAD to Mid LAD lesion, 20 %stenosed.  Mid LAD lesion, 20 %stenosed.  1. Mild non-obstructive disease in the LAD and RCA 2. Moderate stenosis in the distal segment of the moderate caliber intermediate branch 3. Chronic total occlusion of the mid Circumflex artery. There is dye staining in the occluded segment suggesting sub-acute lesion. The distal segment and OM branch fills from left to left collaterals slowly.  4. Successful PTCA/DES x 1 mid Circumflex artery  Recommendations: Will continue DAPT with ASA and Brilinta. She does not have a true ASA allergy. High dose statin. Continue beta blocker.     ECHO 05/27/17 Study Conclusions  - Left ventricle: The cavity size was normal. There was mild concentric hypertrophy. Systolic function was vigorous. The estimated ejection fraction was in the range of 65% to 70%. Wall motion was normal; there were no regional wall motion abnormalities. Doppler parameters are consistent with abnormal left ventricular relaxation (grade 1 diastolic dysfunction). - Tricuspid valve: There was trivial regurgitation.  Impressions:  - Compared to the prior study, there has been no significant interval change.   Cardiac CT  FINDINGS: FFRct analysis was performed on the original cardiac CT angiogram dataset. Diagrammatic representation of the FFRct analysis is provided  in a separate PDF document in PACS.  This dictation was created using the PDF document and an interactive 3D model of the results. 3D model is not available in the EMR/PACS. Normal FFR range is >0.80.  1. Left Main: No significant stenosis.  2. LAD: No significant stenosis. 3. Ramus intermedius: Proximal FFR: 0.95, mid FFR 0.82, distal FFR 0.78. 4. LCX: No significant stenosis in the proximal portion. Proximal to mid portion is occluded. 5. RCA: No significant stenosis.  IMPRESSION: 1. CT FFR analysis showed significant stenosis in the distal ramus intermedius and occluded LCX after the proximal segment.       ASSESSMENT & PLAN:    1. CAD -Patient has a dyspnea on exertion and shortness of breath after taking Brilinta.  This is improving.  No chest pain.  Advised to take Brillinta with caffeine.  If no improvement she will give [place] a call for further advice.  Continue aspirin, Brilinta, beta-blocker and statin.  2. HLD - 03/08/2017: Cholesterol 260; HDL 66.30; LDL Cholesterol 172; Triglycerides 108.0; VLDL 21.6 -LDL goal less than 70.  She is tolerating Crestor well, will increase to 10 mg daily.  Lipid clinic referral for PCSK 9 inhibitor.  Consider LFT and lipid panel during lipid clinic evaluation.  3. HTN -Orthosis is improving.  Continue current dose of beta-blocker.  Advised to get balance before moving.  4.  Tobacco smoking -Congratulated on smoking sensation.  5.  Morbid obesity - Body mass index is 34.24 kg/m.[name] Kitchen  DASH diet.  Enroll in cardiac rehab phase 2 and water aerobics.  Seems encouraged.     Medication Adjustments/Labs and Tests Ordered: Current medicines are reviewed at length with the patient today.  Concerns regarding medicines are outlined above.  Medication changes, Labs and Tests ordered today are listed in the Patient Instructions below. Patient Instructions  Medication Instructions: Your physician has recommended you make the following change in your medication:  -1) INCREASE  Crestor (Rosuvastatin) 10 mg - Take 1 tablet (10 mg) by mouth daily - New RX sent  Labwork: None Ordered  Procedures/Testing: None Ordered  Follow-Up: Your physician recommends that you schedule a follow-up appointment in: 3 MONTHS with Dr. Harrington Challenger.  You have been referred to the [facility] with our Pharmacists    Any Additional Special Instructions Will Be Listed Below (If Applicable).  -- Please try to take your Brilinta with Caffeine products (coffee, tea, etc.) --   DASH Eating Plan DASH stands for "Dietary Approaches to Stop Hypertension." The DASH eating plan is a healthy eating plan that has been shown to reduce high blood pressure (hypertension). It may also reduce your risk for type 2 diabetes, heart disease, and stroke. The DASH eating plan may also help with weight loss. What are tips for following this plan? General guidelines  Avoid eating more than 2,000 mg (milligrams) of salt (sodium) a day. If you have hypertension, you may need to reduce your sodium intake to 1,500 mg a day.  Limit alcohol intake to no more than 1 drink a day for nonpregnant women and 2 drinks a day for men. One drink equals 12 oz of beer, 5 oz of wine, or 1 oz of hard liquor.  Work with your health care provider to maintain a healthy body weight or to lose weight. Ask what an ideal weight is for you.  Get at least 30 minutes of exercise that causes your heart to beat faster (aerobic exercise) most days of the week. Activities may include  walking, swimming, or biking.  Work with your health care provider or diet and nutrition specialist (dietitian) to adjust your eating plan to your individual calorie needs. Reading food labels  Check food labels for the amount of sodium per serving. Choose foods with less than 5 percent of the Daily Value of sodium. Generally, foods with less than 300 mg of sodium per serving fit into this eating plan.  To find whole grains, look for the word "whole" as the  first word in the ingredient list. Shopping  Buy products labeled as "low-sodium" or "no salt added."  Buy fresh foods. Avoid canned foods and premade or frozen meals. Cooking  Avoid adding salt when cooking. Use salt-free seasonings or herbs instead of table salt or sea salt. Check with your health care provider or pharmacist before using salt substitutes.  Do not fry foods. Cook foods using healthy methods such as baking, boiling, grilling, and broiling instead.  Cook with heart-healthy oils, such as olive, canola, soybean, or sunflower oil. Meal planning   Eat a balanced diet that includes: ? 5 or more servings of fruits and vegetables each day. At each meal, try to fill half of your plate with fruits and vegetables. ? Up to 6-8 servings of whole grains each day. ? Less than 6 oz of lean meat, poultry, or fish each day. A 3-oz serving of meat is about the same size as a deck of cards. One egg equals 1 oz. ? 2 servings of low-fat dairy each day. ? A serving of nuts, seeds, or beans 5 times each week. ? Heart-healthy fats. Healthy fats called Omega-3 fatty acids are found in foods such as flaxseeds and coldwater fish, like sardines, salmon, and mackerel.  Limit how much you eat of the following: ? Canned or prepackaged foods. ? Food that is high in trans fat, such as fried foods. ? Food that is high in saturated fat, such as fatty meat. ? Sweets, desserts, sugary drinks, and other foods with added sugar. ? Full-fat dairy products.  Do not salt foods before eating.  Try to eat at least 2 vegetarian meals each week.  Eat more home-cooked food and less restaurant, buffet, and fast food.  When eating at a restaurant, ask that your food be prepared with less salt or no salt, if possible. What foods are recommended? The items listed may not be a complete list. Talk with your dietitian about what dietary choices are best for you. Grains Whole-grain or whole-wheat bread. Whole-grain  or whole-wheat pasta. Brown rice. [place]row. Bulgur. Whole-grain and low-sodium cereals. Pita bread. Low-fat, low-sodium crackers. Whole-wheat flour tortillas. Vegetables Fresh or frozen vegetables (raw, steamed, roasted, or grilled). Low-sodium or reduced-sodium tomato and vegetable juice. Low-sodium or reduced-sodium tomato sauce and tomato paste. Low-sodium or reduced-sodium canned vegetables. Fruits All fresh, dried, or frozen fruit. Canned fruit in natural juice (without added sugar). Meat and other protein foods Skinless chicken or [place]. Ground chicken or [place]. Pork with fat trimmed off. Fish and seafood. Egg whites. Dried beans, peas, or lentils. Unsalted nuts, nut butters, and seeds. Unsalted canned beans. Lean cuts of beef with fat trimmed off. Low-sodium, lean deli meat. Dairy Low-fat (1%) or fat-free (skim) milk. Fat-free, low-fat, or reduced-fat cheeses. Nonfat, low-sodium ricotta or cottage cheese. Low-fat or nonfat yogurt. Low-fat, low-sodium cheese. Fats and oils Soft margarine without trans fats. Vegetable oil. Low-fat, reduced-fat, or light mayonnaise and salad dressings (reduced-sodium). Canola, safflower, olive, soybean, and sunflower oils. Avocado. Seasoning and other  foods Herbs. Spices. Seasoning mixes without salt. Unsalted popcorn and pretzels. Fat-free sweets. What foods are not recommended? The items listed may not be a complete list. Talk with your dietitian about what dietary choices are best for you. Grains Baked goods made with fat, such as croissants, muffins, or some breads. Dry pasta or rice meal packs. Vegetables Creamed or fried vegetables. Vegetables in a cheese sauce. Regular canned vegetables (not low-sodium or reduced-sodium). Regular canned tomato sauce and paste (not low-sodium or reduced-sodium). Regular tomato and vegetable juice (not low-sodium or reduced-sodium). Angie Fava. Olives. Fruits Canned fruit in a light or heavy syrup. Fried fruit.  Fruit in cream or butter sauce. Meat and other protein foods Fatty cuts of meat. Ribs. Fried meat. Berniece Salines. Sausage. Bologna and other processed lunch meats. Salami. Fatback. Hotdogs. Bratwurst. Salted nuts and seeds. Canned beans with added salt. Canned or smoked fish. Whole eggs or egg yolks. Chicken or [place] with skin. Dairy Whole or 2% milk, cream, and half-and-half. Whole or full-fat cream cheese. Whole-fat or sweetened yogurt. Full-fat cheese. Nondairy creamers. Whipped toppings. Processed cheese and cheese spreads. Fats and oils Butter. Stick margarine. Lard. Shortening. Ghee. Bacon fat. Tropical oils, such as coconut, palm kernel, or palm oil. Seasoning and other foods Salted popcorn and pretzels. Onion salt, garlic salt, seasoned salt, table salt, and sea salt. Worcestershire sauce. Tartar sauce. Barbecue sauce. Teriyaki sauce. Soy sauce, including reduced-sodium. Steak sauce. Canned and packaged gravies. Fish sauce. Oyster sauce. Cocktail sauce. Horseradish that you find on the shelf. Ketchup. Mustard. Meat flavorings and tenderizers. Bouillon cubes. Hot sauce and Tabasco sauce. Premade or packaged marinades. Premade or packaged taco seasonings. Relishes. Regular salad dressings. Where to find more information:  National Heart, Lung, and [place]: https://wilson-eaton.com/  American Heart Association: www.heart.org Summary  The DASH eating plan is a healthy eating plan that has been shown to reduce high blood pressure (hypertension). It may also reduce your risk for type 2 diabetes, heart disease, and stroke.  With the DASH eating plan, you should limit salt (sodium) intake to 2,000 mg a day. If you have hypertension, you may need to reduce your sodium intake to 1,500 mg a day.  When on the DASH eating plan, aim to eat more fresh fruits and vegetables, whole grains, lean proteins, low-fat dairy, and heart-healthy fats.  Work with your health care provider or diet and nutrition  specialist (dietitian) to adjust your eating plan to your individual calorie needs. This information is not intended to replace advice given to you by your health care provider. Make sure you discuss any questions you have with your health care provider. Document Released: 07/08/2011 Document Revised: 07/12/2016 Document Reviewed: 07/12/2016 Elsevier Interactive Patient Education  2017 [employer].    If you need a refill on your cardiac medications before your next appointment, please call your pharmacy.      Jarrett Soho, [place]  06/10/2017 9:40 AM    [facility] [place], [place], [place]  [address] Phone: [phone]; Fax: [phone]

## 2017-06-10 ENCOUNTER — Ambulatory Visit: Payer: Medicare HMO | Admitting: Physician Assistant

## 2017-06-10 ENCOUNTER — Encounter: Payer: Self-pay | Admitting: Physician Assistant

## 2017-06-10 VITALS — BP 134/78 | HR 74 | Ht 66.0 in | Wt 212.1 lb

## 2017-06-10 DIAGNOSIS — I10 Essential (primary) hypertension: Secondary | ICD-10-CM | POA: Diagnosis not present

## 2017-06-10 DIAGNOSIS — R69 Illness, unspecified: Secondary | ICD-10-CM | POA: Diagnosis not present

## 2017-06-10 DIAGNOSIS — I25110 Atherosclerotic heart disease of native coronary artery with unstable angina pectoris: Secondary | ICD-10-CM

## 2017-06-10 DIAGNOSIS — E785 Hyperlipidemia, unspecified: Secondary | ICD-10-CM | POA: Diagnosis not present

## 2017-06-10 DIAGNOSIS — Z720 Tobacco use: Secondary | ICD-10-CM | POA: Diagnosis not present

## 2017-06-10 MED ORDER — ROSUVASTATIN CALCIUM 10 MG PO TABS: 10.0000 mg | ORAL_TABLET | Freq: Every day | ORAL | 4 refills | Status: DC

## 2017-06-10 NOTE — Patient Instructions (Signed)
Medication Instructions: Your physician has recommended you make the following change in your medication:  -1) INCREASE Crestor (Rosuvastatin) 10 mg - Take 1 tablet (10 mg) by mouth daily - New RX sent  Labwork: None Ordered  Procedures/Testing: None Ordered  Follow-Up: Your physician recommends that you schedule a follow-up appointment in: 3 MONTHS with Dr. Harrington Challenger.  You have been referred to the [facility] with our Pharmacists    Any Additional Special Instructions Will Be Listed Below (If Applicable).  -- Please try to take your Brilinta with Caffeine products (coffee, tea, etc.) --   DASH Eating Plan DASH stands for "Dietary Approaches to Stop Hypertension." The DASH eating plan is a healthy eating plan that has been shown to reduce high blood pressure (hypertension). It may also reduce your risk for type 2 diabetes, heart disease, and stroke. The DASH eating plan may also help with weight loss. What are tips for following this plan? General guidelines  Avoid eating more than 2,000 mg (milligrams) of salt (sodium) a day. If you have hypertension, you may need to reduce your sodium intake to 1,500 mg a day.  Limit alcohol intake to no more than 1 drink a day for nonpregnant women and 2 drinks a day for men. One drink equals 12 oz of beer, 5 oz of wine, or 1 oz of hard liquor.  Work with your health care provider to maintain a healthy body weight or to lose weight. Ask what an ideal weight is for you.  Get at least 30 minutes of exercise that causes your heart to beat faster (aerobic exercise) most days of the week. Activities may include walking, swimming, or biking.  Work with your health care provider or diet and nutrition specialist (dietitian) to adjust your eating plan to your individual calorie needs. Reading food labels  Check food labels for the amount of sodium per serving. Choose foods with less than 5 percent of the Daily Value of sodium. Generally, foods with less  than 300 mg of sodium per serving fit into this eating plan.  To find whole grains, look for the word "whole" as the first word in the ingredient list. Shopping  Buy products labeled as "low-sodium" or "no salt added."  Buy fresh foods. Avoid canned foods and premade or frozen meals. Cooking  Avoid adding salt when cooking. Use salt-free seasonings or herbs instead of table salt or sea salt. Check with your health care provider or pharmacist before using salt substitutes.  Do not fry foods. Cook foods using healthy methods such as baking, boiling, grilling, and broiling instead.  Cook with heart-healthy oils, such as olive, canola, soybean, or sunflower oil. Meal planning   Eat a balanced diet that includes: ? 5 or more servings of fruits and vegetables each day. At each meal, try to fill half of your plate with fruits and vegetables. ? Up to 6-8 servings of whole grains each day. ? Less than 6 oz of lean meat, poultry, or fish each day. A 3-oz serving of meat is about the same size as a deck of cards. One egg equals 1 oz. ? 2 servings of low-fat dairy each day. ? A serving of nuts, seeds, or beans 5 times each week. ? Heart-healthy fats. Healthy fats called Omega-3 fatty acids are found in foods such as flaxseeds and coldwater fish, like sardines, salmon, and mackerel.  Limit how much you eat of the following: ? Canned or prepackaged foods. ? Food that is high in trans  fat, such as fried foods. ? Food that is high in saturated fat, such as fatty meat. ? Sweets, desserts, sugary drinks, and other foods with added sugar. ? Full-fat dairy products.  Do not salt foods before eating.  Try to eat at least 2 vegetarian meals each week.  Eat more home-cooked food and less restaurant, buffet, and fast food.  When eating at a restaurant, ask that your food be prepared with less salt or no salt, if possible. What foods are recommended? The items listed may not be a complete list. Talk  with your dietitian about what dietary choices are best for you. Grains Whole-grain or whole-wheat bread. Whole-grain or whole-wheat pasta. Brown rice. [place]row. Bulgur. Whole-grain and low-sodium cereals. Pita bread. Low-fat, low-sodium crackers. Whole-wheat flour tortillas. Vegetables Fresh or frozen vegetables (raw, steamed, roasted, or grilled). Low-sodium or reduced-sodium tomato and vegetable juice. Low-sodium or reduced-sodium tomato sauce and tomato paste. Low-sodium or reduced-sodium canned vegetables. Fruits All fresh, dried, or frozen fruit. Canned fruit in natural juice (without added sugar). Meat and other protein foods Skinless chicken or [place]. Ground chicken or [place]. Pork with fat trimmed off. Fish and seafood. Egg whites. Dried beans, peas, or lentils. Unsalted nuts, nut butters, and seeds. Unsalted canned beans. Lean cuts of beef with fat trimmed off. Low-sodium, lean deli meat. Dairy Low-fat (1%) or fat-free (skim) milk. Fat-free, low-fat, or reduced-fat cheeses. Nonfat, low-sodium ricotta or cottage cheese. Low-fat or nonfat yogurt. Low-fat, low-sodium cheese. Fats and oils Soft margarine without trans fats. Vegetable oil. Low-fat, reduced-fat, or light mayonnaise and salad dressings (reduced-sodium). Canola, safflower, olive, soybean, and sunflower oils. Avocado. Seasoning and other foods Herbs. Spices. Seasoning mixes without salt. Unsalted popcorn and pretzels. Fat-free sweets. What foods are not recommended? The items listed may not be a complete list. Talk with your dietitian about what dietary choices are best for you. Grains Baked goods made with fat, such as croissants, muffins, or some breads. Dry pasta or rice meal packs. Vegetables Creamed or fried vegetables. Vegetables in a cheese sauce. Regular canned vegetables (not low-sodium or reduced-sodium). Regular canned tomato sauce and paste (not low-sodium or reduced-sodium). Regular tomato and vegetable  juice (not low-sodium or reduced-sodium). Angie Fava. Olives. Fruits Canned fruit in a light or heavy syrup. Fried fruit. Fruit in cream or butter sauce. Meat and other protein foods Fatty cuts of meat. Ribs. Fried meat. Berniece Salines. Sausage. Bologna and other processed lunch meats. Salami. Fatback. Hotdogs. Bratwurst. Salted nuts and seeds. Canned beans with added salt. Canned or smoked fish. Whole eggs or egg yolks. Chicken or [place] with skin. Dairy Whole or 2% milk, cream, and half-and-half. Whole or full-fat cream cheese. Whole-fat or sweetened yogurt. Full-fat cheese. Nondairy creamers. Whipped toppings. Processed cheese and cheese spreads. Fats and oils Butter. Stick margarine. Lard. Shortening. Ghee. Bacon fat. Tropical oils, such as coconut, palm kernel, or palm oil. Seasoning and other foods Salted popcorn and pretzels. Onion salt, garlic salt, seasoned salt, table salt, and sea salt. Worcestershire sauce. Tartar sauce. Barbecue sauce. Teriyaki sauce. Soy sauce, including reduced-sodium. Steak sauce. Canned and packaged gravies. Fish sauce. Oyster sauce. Cocktail sauce. Horseradish that you find on the shelf. Ketchup. Mustard. Meat flavorings and tenderizers. Bouillon cubes. Hot sauce and Tabasco sauce. Premade or packaged marinades. Premade or packaged taco seasonings. Relishes. Regular salad dressings. Where to find more information:  National Heart, Lung, and [place]: https://wilson-eaton.com/  American Heart Association: www.heart.org Summary  The DASH eating plan is a healthy eating plan that has  been shown to reduce high blood pressure (hypertension). It may also reduce your risk for type 2 diabetes, heart disease, and stroke.  With the DASH eating plan, you should limit salt (sodium) intake to 2,000 mg a day. If you have hypertension, you may need to reduce your sodium intake to 1,500 mg a day.  When on the DASH eating plan, aim to eat more fresh fruits and vegetables, whole grains,  lean proteins, low-fat dairy, and heart-healthy fats.  Work with your health care provider or diet and nutrition specialist (dietitian) to adjust your eating plan to your individual calorie needs. This information is not intended to replace advice given to you by your health care provider. Make sure you discuss any questions you have with your health care provider. Document Released: 07/08/2011 Document Revised: 07/12/2016 Document Reviewed: 07/12/2016 Elsevier Interactive Patient Education  2017 [employer].    If you need a refill on your cardiac medications before your next appointment, please call your pharmacy.

## 2017-06-13 DIAGNOSIS — Z1231 Encounter for screening mammogram for malignant neoplasm of breast: Secondary | ICD-10-CM | POA: Diagnosis not present

## 2017-06-13 LAB — HM MAMMOGRAPHY

## 2017-06-15 ENCOUNTER — Encounter: Payer: Self-pay | Admitting: Family Medicine

## 2017-06-15 NOTE — Telephone Encounter (Signed)
Called and spoke with patient in regards to Cardiac Rehab - Patient is interested. Patient would like for me to call back at 2:00pm.

## 2017-06-26 DIAGNOSIS — R69 Illness, unspecified: Secondary | ICD-10-CM | POA: Diagnosis not present

## 2017-06-27 ENCOUNTER — Ambulatory Visit: Payer: Medicare HMO | Admitting: Family Medicine

## 2017-06-27 ENCOUNTER — Encounter: Payer: Self-pay | Admitting: Family Medicine

## 2017-06-27 VITALS — BP 133/75 | HR 70 | Temp 98.4°F | Resp 12 | Ht 66.0 in | Wt 214.0 lb

## 2017-06-27 DIAGNOSIS — M542 Cervicalgia: Secondary | ICD-10-CM | POA: Diagnosis not present

## 2017-06-27 NOTE — Patient Instructions (Addendum)
A few things to remember from today's visit:   Cervicalgia   Please be sure medication list is accurate. If a new problem present, please set up appointment sooner than planned today.     Continue Flexeril. Salone spray on area may help. Range of motion exercises. It seems muscular.     Acute Torticollis, Adult Torticollis is a condition in which the muscles of the neck tighten (contract) abnormally, causing the neck to twist and the head to move into an unnatural position. Torticollis that develops suddenly is called acute torticollis. People with acute torticollis may have trouble turning their head. The condition can be painful and may range from mild to severe. What are the causes? This condition may be caused by:  Sleeping in an awkward position (common).  Extending or twisting the neck muscles beyond their normal position.  An injury to the neck muscles.  An infection.  A tumor.  Certain medicines.  Long-lasting spasms of the neck muscles.  In some cases, the cause may not be known. What increases the risk? You are more likely to develop this condition if:  You have a condition associated with loose ligaments, such as Down syndrome.  You have a brain condition that affects vision, such as strabismus.  What are the signs or symptoms? The main symptom of this condition is tilting of the head to one side. Other symptoms include:  Pain in the neck.  Trouble turning the head from side to side or up and down.  How is this diagnosed? This condition may be diagnosed based on:  A physical exam.  Your medical history.  Imaging tests, such as: ? An X-ray. ? An ultrasound. ? A CT scan. ? An MRI.  How is this treated? Treatment for this condition depends on what is causing the condition. Mild cases may go away without treatment. Treatment for more serious cases may include:  Medicines or shots to relax the muscles.  Other medicines, such as antibiotics  to treat the underlying cause.  Wearing a soft neck collar.  Physical therapy and stretching to improve neck strength and flexibility.  Neck massage.  In severe cases, surgery may be needed to repair dislocated or broken bones or to treat nerves in the neck. Follow these instructions at home:  Take over-the-counter and prescription medicines only as told by your health care provider.  Do stretching exercises and massage your neck as told by your health care provider.  If directed, apply heat to the affected area as often as told by your health care provider. Use the heat source that your health care provider recommends, such as a moist heat pack or a heating pad. ? Place a towel between your skin and the heat source. ? Leave the heat on for 20-30 minutes. ? Remove the heat if your skin turns bright red. This is especially important if you are unable to feel pain, heat, or cold. You may have a greater risk of getting burned.  If you wake up with torticollis after sleeping, check your bed or sleeping area. Look for lumpy pillows or unusual objects. Make sure your bed and sleeping area are comfortable.  Keep all follow-up visits as told by your health care provider. This is important. Contact a health care provider if:  You have a fever.  Your symptoms do not improve or they get worse. Get help right away if:  You have trouble breathing.  You develop noisy breathing (stridor).  You start to drool.  You have trouble swallowing or pain when swallowing.  You develop numbness or weakness in your hands or feet.  You have changes in your speech, understanding, or vision.  You are in severe pain.  You cannot move your head or neck. Summary  Torticollis is a condition in which the muscles of the neck tighten (contract) abnormally, causing the neck to twist and the head to move into an unnatural position. Torticollis that develops suddenly is called acute torticollis.  Treatment  for this condition depends on what is causing the condition. Mild cases may go away without treatment.  Do stretching exercises and massage your neck as told by your health care provider. You may also be instructed to apply heat to the area.  Contact your health care provider if your symptoms do not improve or they get worse. This information is not intended to replace advice given to you by your health care provider. Make sure you discuss any questions you have with your health care provider. Document Released: 07/16/2000 Document Revised: 09/16/2016 Document Reviewed: 09/16/2016 Elsevier Interactive Patient Education  [employer].

## 2017-06-27 NOTE — Progress Notes (Signed)
ACUTE VISIT   HPI:  Chief Complaint  Patient presents with  . Neck Pain    [name] is a 71 y.o. female, who is here today complaining of "crook in neck."  Yesterday morning she started with left-sided neck pain, exacerbated by movement, no alleviating factors identified. Sharp,constant pain,9/10. Limitation of cervical ROM. Pain is not radiated. She denies associated fever,chills,odynophagia or dysphagia. No Hx of trauma.  She denies prior Hx of cervical pain.  She has tried OTC "creams" and local heat.  Problem otherwise stable.   In 04/2017 she was in the ED because left upper back pain. Cervical MRI 09/2006 due to "left-sided neck pain since 07/2006." Hx of lower back pain.  She follows with neurology for numbness on upper extremities/peripheral neuropathy. RA, follows with rheumatologist.  Hx of CAD, recently she underwent left heart cath and coronary stent (05/27/17).   Review of Systems  Constitutional: Positive for fatigue. Negative for activity change, appetite change and fever.  HENT: Negative for mouth sores, sore throat, trouble swallowing and voice change.   Respiratory: Negative for cough, shortness of breath and wheezing.   Cardiovascular: Negative for leg swelling.  Gastrointestinal: Negative for abdominal pain, nausea and vomiting.       Negative for changes in bowel habits.  Genitourinary: Negative for decreased urine volume and hematuria.  Musculoskeletal: Positive for arthralgias and neck pain. Negative for gait problem and joint swelling.  Skin: Negative for rash.  Neurological: Negative for syncope, facial asymmetry, weakness and headaches.  Psychiatric/Behavioral: Negative for confusion. The patient is nervous/anxious.       Current Outpatient Medications on File Prior to Visit  Medication Sig Dispense Refill  . acetaminophen (TYLENOL) 500 MG tablet Take 500 mg by mouth 2 (two) times daily as needed (pain).    [name] Kitchen aspirin 81 MG  chewable tablet Chew 1 tablet (81 mg total) by mouth daily.    . cyclobenzaprine (FLEXERIL) 5 MG tablet Take 1 tablet (5 mg total) by mouth 3 (three) times daily as needed for muscle spasms. 30 tablet 1  . fluticasone (FLONASE) 50 MCG/ACT nasal spray Place 1 spray into both nostrils 2 (two) times daily as needed for allergies. 32 g 7  . gabapentin (NEURONTIN) 300 MG capsule Take 1 capsule (300 mg total) by mouth at bedtime. 90 capsule 4  . metoprolol tartrate (LOPRESSOR) 25 MG tablet Take 0.5 tablets (12.5 mg total) by mouth 2 (two) times daily. 15 tablet 6  . Multiple Vitamin (MULTIVITAMIN) tablet Take 1 tablet by mouth daily.    . nitroGLYCERIN (NITROSTAT) 0.4 MG SL tablet Place 1 tablet (0.4 mg total) under the tongue every 5 (five) minutes as needed for chest pain. 30 tablet 6  . NONFORMULARY OR COMPOUNDED ITEM Rx for compounded cream faxed to [name]    . polyethylene glycol powder (GLYCOLAX/MIRALAX) powder Take 17 g by mouth daily. 500 g 3  . ranitidine (ZANTAC) 150 MG tablet Take 1 tablet (150 mg total) by mouth 2 (two) times daily. 180 tablet 2  . ticagrelor (BRILINTA) 90 MG TABS tablet Take 1 tablet (90 mg total) by mouth 2 (two) times daily. 60 tablet 10   No current facility-administered medications on file prior to visit.      Past Medical History:  Diagnosis Date  . Allergy   . Arthritis    RA (Dr. Ouida Blackwell)  . C. difficile diarrhea    history of  . CAD (coronary artery disease), native coronary  artery    a. Cardiac CT with + FFR for Ramus b. cath 05/2017 100% sub acute lesion to mid circumflex artery s/p DES.  . CKD (chronic kidney disease) stage 3, GFR 30-59 ml/min ([place]) 10/11/2016   s/p R nephrectomy  . Colon polyps 2008   HYPERPLASTIC  . Costochondritis    a. Nuc Stress Test 6/16: EF 70, no scar or ischemia, low risk // b. Echo 3/16: mild conc LVH, EF 65-70, no RWMA, Gr 1 DD, mild TR  . Diabetes mellitus without complication ([place])    type 2  . Gastritis   .  GERD (gastroesophageal reflux disease)   . History of echocardiogram    Echo 3/18:  Moderate LVH, EF 07-37, grade 1 diastolic dysfunction, calcified aortic valve, mild MR, moderate LAE  . History of nuclear stress test    Myoview 3/18: Mod size and intensity fixed septal defect, may be artifact.Opposite mod size and intensity lat defect, which is reversible and could represent ischemia or possibly artifact (SDS 4). LVEF 71% with normal wall motion. Intermediate risk study. >> images reviewed with Dr. Dorris Carnes - no sig ischemia; med rx   . History of pneumonia   . Hx of cardiovascular stress test    Lexiscan Myoview 6/16:  EF 70%, no scar or ischemia; Low Risk  . Hypertension   . Neuropathy   . Orthostatic hypotension 05/28/2017  . Osteoarthritis   . S/P angioplasty with stent 05/27/17 to LCX with DES  05/28/2017  . Thyroid disease    Allergies  Allergen Reactions  . Percocet [Oxycodone-Acetaminophen] Hives, Itching and Other (See Comments)    hallucinations  . Pravachol [Pravastatin Sodium] Other (See Comments)    Muscle aches  . Hydrocodone Other (See Comments)    "crazy dreams"  . Aspirin Other (See Comments)    REACTION: GI upset    Social History   Socioeconomic History  . Marital status: Divorced    Spouse name: None  . Number of children: 3  . Years of education: 75  . Highest education level: None  Social Needs  . Financial resource strain: None  . Food insecurity - worry: None  . Food insecurity - inability: None  . Transportation needs - medical: None  . Transportation needs - non-medical: None  Occupational History  . Occupation: Retired    Fish farm manager: RETIRED  Tobacco Use  . Smoking status: Former Smoker    Years: 15.00    Types: Cigarettes    Last attempt to quit: 05/27/2017    Years since quitting: 0.0  . Smokeless tobacco: Never Used  . Tobacco comment: 03/16/17  1 pk/week  Substance and Sexual Activity  . Alcohol use: No    Alcohol/week: 0.0 oz  .  Drug use: No  . Sexual activity: None  Other Topics Concern  . None  Social History Narrative   Single, dgtr lives with her   Never Smoked    Alcohol use- no   Drug use-no   Regular Exercise-yes   Former Smoker- 12/2008    Vitals:   06/27/17 1550  BP: 133/75  Pulse: 70  Resp: 12  Temp: 98.4 F (36.9 C)  SpO2: 99%   Body mass index is 34.54 kg/m.   Physical Exam  Nursing note and vitals reviewed. Constitutional: She is oriented to person, place, and time. She appears well-developed. She does not appear ill. No distress.  HENT:  Head: Normocephalic and atraumatic.  Mouth/Throat: Oropharynx is clear and moist and mucous  membranes are normal.  Eyes: Conjunctivae are normal. Pupils are equal, round, and reactive to light.  Cardiovascular: Normal rate and regular rhythm.  No murmur heard. Respiratory: Effort normal and breath sounds normal. No respiratory distress.  Musculoskeletal: She exhibits no edema.       Cervical back: She exhibits tenderness and spasm.  No significant deformity appreciated. Tenderness upon palpation of left sternocleidomastoid muscle and left trapezium. Limitation of cervical ROM, mainly rotation, movement elicits pain. No local edema or erythema appreciated, no suspicious lesions.  Lymphadenopathy:    She has no cervical adenopathy.  Neurological: She is alert and oriented to person, place, and time. She has normal strength. Gait normal.  Skin: Skin is warm. No rash noted. No erythema.  Psychiatric: Her mood appears anxious.  Well groomed, good eye contact.    ASSESSMENT AND PLAN:   Kashawna was seen today for neck pain.  Diagnoses and all orders for this visit:  Cervicalgia   Cervical MRI 09/05/2006: Degenerative disk disease from C4-5 through C6-7. The only focal area of slight nerve root impingement is on the ventral nerve rootlet of C6 on the left due to asymmetric spurring and bulging at C5-6.   It seems muscular. Finding on examination  do not suggest a serious process. Because no Hx of recent trauma I do not think imaging is needed today. She has Flexeril at home, which she can continue.Side effects reviewed. ROM exercises, local OTC Icy hot,Salone, or Asper cream may help. Clearly instructed about warning signs.    -Ms.[name] was advised to seek immediate medical attention if sudden worsening symptoms or to follow if they persist or if new concerns arise.       Taygan Connell G. [name], MD  [facility] [place] office.

## 2017-06-29 NOTE — Progress Notes (Signed)
Patient ID: [name]                 DOB: 1946/04/20                    MRN: [id number]     HPI: [name] is a 71 y.o. female patient of Dr. Harrington Challenger referred to lipid clinic by Leanor Kail, PA-C. PMH is significant for CAD (UA s/p DES to mid Cx 10/18), HTN, dyslipidemia, T2DM, CKD3 s/p R nephrectomy,  allergic rhinitis, and GERD. Pt has reported history of statin intolerance but was started on Crestor at recent hospitalization and had been tolerating well at last OV so dose increased to 10mg  daily.  Pt presents ambulating with her son. Pt states she has had neck pain since starting higher dose of Crestor but recently saw her PCP who thought this was a neck spasm. Otherwise pt states she is tolerating Crestor well. Pt reports prior statins caused leg and hip pain within a week or two of starting and resolved upon stopping the statin, but this has not occurred with the Crestor.  Current Medications: rosuvastatin 10mg  daily Intolerances: atorvastatin 20mg  daily (muscle aches), pravastatin 20mg  daily (muscle aches), simvastatin 20mg  daily (muscle aches) Risk Factors: former smoker, CAD s/p DES, HTN, T2DM, CKD3 LDL goal: <70 mg/dl  Diet: Breakfast - oatmeal, [place] sausage, grits, eggs, cereal, pancakes; Lunch - skips, eats nabs; Dinner - sandwich, meat + vegetable, pasta. Drinks 1-2 soda/day. Coffee with no cream.  Exercise: Pt is going to start back with water aerobics 3x/week.   Family History: DM (brother), CAD (father), CKD (son), CVA (mother)  Social History: Former smoker (15 pack-yr hx, quit 05/2017). Denies illicit drug or alcohol use.  Labs: Lipid Panel (03/08/2017): TC 260, TG 108, HDL 66, LDL 172 (no medications)  Past Medical History:  Diagnosis Date  . Allergy   . Arthritis    RA (Dr. Ouida Sills)  . C. difficile diarrhea    history of  . CAD (coronary artery disease), native coronary artery    a. Cardiac CT with + FFR for Ramus b. cath 05/2017 100% sub acute lesion to  mid circumflex artery s/p DES.  . CKD (chronic kidney disease) stage 3, GFR 30-59 ml/min ([place]) 10/11/2016   s/p R nephrectomy  . Colon polyps 2008   HYPERPLASTIC  . Costochondritis    a. Nuc Stress Test 6/16: EF 70, no scar or ischemia, low risk // b. Echo 3/16: mild conc LVH, EF 65-70, no RWMA, Gr 1 DD, mild TR  . Diabetes mellitus without complication ([place])    type 2  . Gastritis   . GERD (gastroesophageal reflux disease)   . History of echocardiogram    Echo 3/18:  Moderate LVH, EF 34-91, grade 1 diastolic dysfunction, calcified aortic valve, mild MR, moderate LAE  . History of nuclear stress test    Myoview 3/18: Mod size and intensity fixed septal defect, may be artifact.Opposite mod size and intensity lat defect, which is reversible and could represent ischemia or possibly artifact (SDS 4). LVEF 71% with normal wall motion. Intermediate risk study. >> images reviewed with Dr. Dorris Carnes - no sig ischemia; med rx   . History of pneumonia   . Hx of cardiovascular stress test    Lexiscan Myoview 6/16:  EF 70%, no scar or ischemia; Low Risk  . Hypertension   . Neuropathy   . Orthostatic hypotension 05/28/2017  . Osteoarthritis   . S/P angioplasty with  stent 05/27/17 to LCX with DES  05/28/2017  . Thyroid disease     Current Outpatient Medications on File Prior to Visit  Medication Sig Dispense Refill  . acetaminophen (TYLENOL) 500 MG tablet Take 500 mg by mouth 2 (two) times daily as needed (pain).    [name] Kitchen aspirin 81 MG chewable tablet Chew 1 tablet (81 mg total) by mouth daily.    . cyclobenzaprine (FLEXERIL) 5 MG tablet Take 1 tablet (5 mg total) by mouth 3 (three) times daily as needed for muscle spasms. 30 tablet 1  . fluticasone (FLONASE) 50 MCG/ACT nasal spray Place 1 spray into both nostrils 2 (two) times daily as needed for allergies. 32 g 7  . gabapentin (NEURONTIN) 300 MG capsule Take 1 capsule (300 mg total) by mouth at bedtime. 90 capsule 4  . metoprolol tartrate  (LOPRESSOR) 25 MG tablet Take 0.5 tablets (12.5 mg total) by mouth 2 (two) times daily. 15 tablet 6  . Multiple Vitamin (MULTIVITAMIN) tablet Take 1 tablet by mouth daily.    . nitroGLYCERIN (NITROSTAT) 0.4 MG SL tablet Place 1 tablet (0.4 mg total) under the tongue every 5 (five) minutes as needed for chest pain. 30 tablet 6  . NONFORMULARY OR COMPOUNDED ITEM Rx for compounded cream faxed to [facility]    . polyethylene glycol powder (GLYCOLAX/MIRALAX) powder Take 17 g by mouth daily. 500 g 3  . ranitidine (ZANTAC) 150 MG tablet Take 1 tablet (150 mg total) by mouth 2 (two) times daily. 180 tablet 2  . rosuvastatin (CRESTOR) 10 MG tablet Take 1 tablet (10 mg total) daily at 6 PM by mouth. 30 tablet 4  . ticagrelor (BRILINTA) 90 MG TABS tablet Take 1 tablet (90 mg total) by mouth 2 (two) times daily. 60 tablet 10   No current facility-administered medications on file prior to visit.     Allergies  Allergen Reactions  . Percocet [Oxycodone-Acetaminophen] Hives, Itching and Other (See Comments)    hallucinations  . Pravachol [Pravastatin Sodium] Other (See Comments)    Muscle aches  . Hydrocodone Other (See Comments)    "crazy dreams"  . Aspirin Other (See Comments)    REACTION: GI upset    Assessment/Plan:  1. Hyperlipidemia - LDL currently uncontrolled at 172 mg/dl on recent lipid panel and above goal of 70mg /dl. At that time patient was on no lipid-lowering therapies but is now on Crestor 10mg . Discussed with pt and will plan to increase dose to 20mg  daily, and re-evaluate tolerance with pt in a few weeks over the phone. If still tolerating Crestor on the higher dose, will plan to increase further to 40mg  daily. Based on recent LDL, pt likely will ultimately need Zetia or consideration of PCSK9i in addition to Crestor. Counseled pt extensively on dietary changes. Will F/U with pt in several weeks and make plans for a repeat lipid panel once she is on maximally tolerated Crestor for 2  months.   Arrie Senate, PharmD, BCPS PGY-2 Cardiology Pharmacy Resident Pager: [phone] 06/30/2017

## 2017-06-30 ENCOUNTER — Ambulatory Visit (INDEPENDENT_AMBULATORY_CARE_PROVIDER_SITE_OTHER): Payer: Medicare HMO | Admitting: Pharmacist

## 2017-06-30 DIAGNOSIS — E785 Hyperlipidemia, unspecified: Secondary | ICD-10-CM

## 2017-06-30 MED ORDER — ROSUVASTATIN CALCIUM 20 MG PO TABS: 20.0000 mg | ORAL_TABLET | Freq: Every day | ORAL | 3 refills | Status: DC

## 2017-06-30 NOTE — Patient Instructions (Addendum)
It was great to meet you.   Increase your Crestor (rosuvastatin) to 20mg  daily. You may take two of the 10mg  tablets until you run out, then pick up the new prescription.   We will call you in a few weeks to make sure you are tolerating it well.

## 2017-07-01 ENCOUNTER — Telehealth: Payer: Self-pay | Admitting: Pharmacist

## 2017-07-01 NOTE — Telephone Encounter (Signed)
Pt reported at lipid clinic visit yesterday that she had difficulty affording Brilinta refill due to copay of around $60. Pt was able to pick up a one-month supply, but notes this will be cost-prohibitive going forward. Discussed with Dr. Harrington Challenger who agrees 3 total months of Brilinta would be ideal, so will plan to provide pt with samples once her current supply runs out. Discussed with pt who will reach out to clinic in several weeks when she only has 1 week left of her Brilinta supply.

## 2017-07-21 DIAGNOSIS — N189 Chronic kidney disease, unspecified: Secondary | ICD-10-CM | POA: Diagnosis not present

## 2017-07-21 DIAGNOSIS — E7800 Pure hypercholesterolemia, unspecified: Secondary | ICD-10-CM | POA: Diagnosis not present

## 2017-07-21 DIAGNOSIS — M1990 Unspecified osteoarthritis, unspecified site: Secondary | ICD-10-CM | POA: Diagnosis not present

## 2017-07-21 DIAGNOSIS — G629 Polyneuropathy, unspecified: Secondary | ICD-10-CM | POA: Diagnosis not present

## 2017-07-21 DIAGNOSIS — M1120 Other chondrocalcinosis, unspecified site: Secondary | ICD-10-CM | POA: Diagnosis not present

## 2017-07-21 DIAGNOSIS — Z79899 Other long term (current) drug therapy: Secondary | ICD-10-CM | POA: Diagnosis not present

## 2017-07-21 DIAGNOSIS — G5602 Carpal tunnel syndrome, left upper limb: Secondary | ICD-10-CM | POA: Insufficient documentation

## 2017-07-21 DIAGNOSIS — M1812 Unilateral primary osteoarthritis of first carpometacarpal joint, left hand: Secondary | ICD-10-CM | POA: Diagnosis not present

## 2017-07-21 DIAGNOSIS — I129 Hypertensive chronic kidney disease with stage 1 through stage 4 chronic kidney disease, or unspecified chronic kidney disease: Secondary | ICD-10-CM | POA: Diagnosis not present

## 2017-07-21 DIAGNOSIS — M0609 Rheumatoid arthritis without rheumatoid factor, multiple sites: Secondary | ICD-10-CM | POA: Diagnosis not present

## 2017-07-21 DIAGNOSIS — M1811 Unilateral primary osteoarthritis of first carpometacarpal joint, right hand: Secondary | ICD-10-CM | POA: Diagnosis not present

## 2017-07-21 DIAGNOSIS — M50322 Other cervical disc degeneration at C5-C6 level: Secondary | ICD-10-CM | POA: Diagnosis not present

## 2017-07-21 DIAGNOSIS — R768 Other specified abnormal immunological findings in serum: Secondary | ICD-10-CM | POA: Diagnosis not present

## 2017-07-21 DIAGNOSIS — E1122 Type 2 diabetes mellitus with diabetic chronic kidney disease: Secondary | ICD-10-CM | POA: Diagnosis not present

## 2017-08-01 ENCOUNTER — Encounter (HOSPITAL_COMMUNITY): Payer: Self-pay

## 2017-08-01 ENCOUNTER — Other Ambulatory Visit: Payer: Self-pay | Admitting: Family Medicine

## 2017-08-01 DIAGNOSIS — J069 Acute upper respiratory infection, unspecified: Secondary | ICD-10-CM

## 2017-08-03 ENCOUNTER — Encounter (HOSPITAL_COMMUNITY): Payer: Self-pay

## 2017-08-05 ENCOUNTER — Encounter (HOSPITAL_COMMUNITY): Payer: Self-pay

## 2017-08-08 ENCOUNTER — Other Ambulatory Visit: Payer: Self-pay | Admitting: Internal Medicine

## 2017-08-08 ENCOUNTER — Encounter (HOSPITAL_COMMUNITY): Payer: Self-pay

## 2017-08-08 NOTE — Telephone Encounter (Signed)
I informed pt that we could not continue to keep giving her samples, because the samples were for pt that's starting on the medication, but I did offer the pt assistant form to help pt get medication. Pt agreed to fill the form and bring back to try and get assistance and I left 2 weeks supply of samples of Brilinta for pt as well, enough to last until pt brings back forms and also to be processed. I advised the pt that if she has any other problems, questions or concerns to call the office. Pt verbalized understanding.

## 2017-08-10 ENCOUNTER — Encounter (HOSPITAL_COMMUNITY)
Admission: RE | Admit: 2017-08-10 | Discharge: 2017-08-10 | Disposition: A | Discharge status: Home / Self Care | Payer: Self-pay | Source: Ambulatory Visit | Attending: Internal Medicine | Admitting: Internal Medicine

## 2017-08-10 DIAGNOSIS — Z955 Presence of coronary angioplasty implant and graft: Secondary | ICD-10-CM | POA: Insufficient documentation

## 2017-08-12 ENCOUNTER — Other Ambulatory Visit: Payer: Self-pay | Admitting: Family Medicine

## 2017-08-12 ENCOUNTER — Encounter (HOSPITAL_COMMUNITY)
Admission: RE | Admit: 2017-08-12 | Discharge: 2017-08-12 | Disposition: A | Discharge status: Home / Self Care | Payer: Medicare HMO | Source: Ambulatory Visit | Attending: Internal Medicine | Admitting: Internal Medicine

## 2017-08-12 DIAGNOSIS — K2100 Gastro-esophageal reflux disease with esophagitis, without bleeding: Secondary | ICD-10-CM

## 2017-08-15 ENCOUNTER — Encounter (HOSPITAL_COMMUNITY): Payer: Self-pay

## 2017-08-17 ENCOUNTER — Encounter (HOSPITAL_COMMUNITY): Payer: Self-pay

## 2017-08-19 ENCOUNTER — Encounter (HOSPITAL_COMMUNITY)
Admission: RE | Admit: 2017-08-19 | Discharge: 2017-08-19 | Disposition: A | Discharge status: Home / Self Care | Payer: Self-pay | Source: Ambulatory Visit | Attending: Internal Medicine | Admitting: Internal Medicine

## 2017-08-19 DIAGNOSIS — E669 Obesity, unspecified: Secondary | ICD-10-CM | POA: Diagnosis not present

## 2017-08-19 DIAGNOSIS — R69 Illness, unspecified: Secondary | ICD-10-CM | POA: Diagnosis not present

## 2017-08-19 DIAGNOSIS — E785 Hyperlipidemia, unspecified: Secondary | ICD-10-CM | POA: Diagnosis not present

## 2017-08-19 DIAGNOSIS — E1142 Type 2 diabetes mellitus with diabetic polyneuropathy: Secondary | ICD-10-CM | POA: Diagnosis not present

## 2017-08-19 DIAGNOSIS — I2510 Atherosclerotic heart disease of native coronary artery without angina pectoris: Secondary | ICD-10-CM | POA: Diagnosis not present

## 2017-08-19 DIAGNOSIS — I951 Orthostatic hypotension: Secondary | ICD-10-CM | POA: Diagnosis not present

## 2017-08-19 DIAGNOSIS — E1151 Type 2 diabetes mellitus with diabetic peripheral angiopathy without gangrene: Secondary | ICD-10-CM | POA: Diagnosis not present

## 2017-08-19 DIAGNOSIS — I10 Essential (primary) hypertension: Secondary | ICD-10-CM | POA: Diagnosis not present

## 2017-08-19 DIAGNOSIS — K219 Gastro-esophageal reflux disease without esophagitis: Secondary | ICD-10-CM | POA: Diagnosis not present

## 2017-08-22 ENCOUNTER — Encounter (HOSPITAL_COMMUNITY)
Admission: RE | Admit: 2017-08-22 | Discharge: 2017-08-22 | Disposition: A | Discharge status: Home / Self Care | Payer: Medicare HMO | Source: Ambulatory Visit | Attending: Internal Medicine | Admitting: Internal Medicine

## 2017-08-22 NOTE — Progress Notes (Signed)
Reviewed home exercise guidelines with patient including endpoints, temperature precautions, target heart rate and rate of perceived exertion. Pt plans to walk as her mode of home exercise. Pt also [facility] Pt voices understanding of instructions given. Sol Passer, MS, ACSM CEP

## 2017-08-24 ENCOUNTER — Encounter (HOSPITAL_COMMUNITY): Payer: Self-pay

## 2017-08-25 ENCOUNTER — Telehealth: Payer: Self-pay | Admitting: Pharmacist

## 2017-08-25 NOTE — Telephone Encounter (Signed)
Spoke with patient about increase of Crestor again as follow up. She reports that she is tolerating Crestor 20mg  daily well, but wants to defer any increase until after she meets with rheumatology on 09/02/17. She is also scheduled to see Dr. Harrington Challenger on 09/12/17. Discussed that ideally would need to increase Crestor to 40mg  daily and plan for repeat lipid panel 8-12 weeks after increase. She states understanding and will discuss further with Dr. Harrington Challenger when she is here on 2/11.

## 2017-08-26 ENCOUNTER — Encounter (HOSPITAL_COMMUNITY)
Admission: RE | Admit: 2017-08-26 | Discharge: 2017-08-26 | Disposition: A | Discharge status: Home / Self Care | Payer: Self-pay | Source: Ambulatory Visit | Attending: Internal Medicine | Admitting: Internal Medicine

## 2017-08-29 ENCOUNTER — Encounter (HOSPITAL_COMMUNITY)
Admission: RE | Admit: 2017-08-29 | Discharge: 2017-08-29 | Disposition: A | Discharge status: Home / Self Care | Payer: Self-pay | Source: Ambulatory Visit | Attending: Internal Medicine | Admitting: Internal Medicine

## 2017-08-29 ENCOUNTER — Other Ambulatory Visit: Payer: Self-pay | Admitting: Internal Medicine

## 2017-08-29 MED ORDER — ROSUVASTATIN CALCIUM 20 MG PO TABS: 20.0000 mg | ORAL_TABLET | Freq: Every day | ORAL | 3 refills | Status: DC

## 2017-08-31 ENCOUNTER — Encounter (HOSPITAL_COMMUNITY): Payer: Self-pay

## 2017-09-02 ENCOUNTER — Encounter (HOSPITAL_COMMUNITY): Payer: Self-pay

## 2017-09-02 DIAGNOSIS — M25432 Effusion, left wrist: Secondary | ICD-10-CM | POA: Diagnosis not present

## 2017-09-02 DIAGNOSIS — Z955 Presence of coronary angioplasty implant and graft: Secondary | ICD-10-CM | POA: Insufficient documentation

## 2017-09-02 DIAGNOSIS — M180 Bilateral primary osteoarthritis of first carpometacarpal joints: Secondary | ICD-10-CM | POA: Diagnosis not present

## 2017-09-02 DIAGNOSIS — M0609 Rheumatoid arthritis without rheumatoid factor, multiple sites: Secondary | ICD-10-CM | POA: Diagnosis not present

## 2017-09-02 DIAGNOSIS — G5602 Carpal tunnel syndrome, left upper limb: Secondary | ICD-10-CM | POA: Diagnosis not present

## 2017-09-05 ENCOUNTER — Encounter (HOSPITAL_COMMUNITY)
Admission: RE | Admit: 2017-09-05 | Discharge: 2017-09-05 | Disposition: A | Discharge status: Home / Self Care | Payer: Self-pay | Source: Ambulatory Visit | Attending: Internal Medicine | Admitting: Internal Medicine

## 2017-09-05 NOTE — Progress Notes (Signed)
HPI:   [name] is a 72 y.o. female, who is here today for 6 months follow up.   She was last seen on 06/27/17.  Since her last OV she has seen rheumatologist to follow on carpal tunnel syndrome, RA, and OA.  Hypertension: Currently she is on Hyzaar 100-12.5 mg daily and Amlodipine 2.5 mg daily. Home BP readings:Not checking.  Last eye exam: Current.  CKD III,, she has no noted gross hematuria, foam in the urine, or decreased urine output.    Lab Results  Component Value Date   CREATININE 1.33 (H) 05/29/2017   BUN 18 05/29/2017   NA 138 05/29/2017   K 3.5 05/29/2017   CL 108 05/29/2017   CO2 22 05/29/2017    She is following with cardiologist for CAD. She also follows with neurology for axonal neuropathy.  HLD:  She is on Crestor 20 mg daily.  She is tolerating the medication well.  Lab Results  Component Value Date   CHOL 260 (H) 03/08/2017   HDL 66.30 03/08/2017   LDLCALC 172 (H) 03/08/2017   LDLDIRECT 171.5 12/06/2011   TRIG 108.0 03/08/2017   CHOLHDL 4 03/08/2017   -Today she does "not feel good". States that she did not sleep well,no sick contact. +Nauseated. No vomiting or diarrhea. Feeling "clammy" and fatigue.  She has no noted fever. Nasal congestion, nasal drainage. History of allergic rhinitis. Currently she is on Flonase nasal spray.  She has not noted chest pain or syncope. Cardiac rehab 3 times per week.   She is exercising 3 times per week through rehab. She is having some palpitation, she checks pulse and it is in normal range. She has had similar symptoms for a while, usually it happens are rest. She has not identified exacerbating or alleviating factors. Hx of anxiety, she is not on pharmacologic treatment.   Review of Systems  Constitutional: Positive for chills and fatigue. Negative for activity change, appetite change and fever.  HENT: Positive for congestion, postnasal drip and rhinorrhea. Negative for mouth sores,  nosebleeds, sore throat and trouble swallowing.   Eyes: Negative for redness and visual disturbance.  Respiratory: Negative for cough, shortness of breath and wheezing.   Cardiovascular: Positive for palpitations. Negative for chest pain and leg swelling.  Gastrointestinal: Positive for nausea. Negative for abdominal pain and vomiting.       Negative for changes in bowel habits.  Genitourinary: Negative for decreased urine volume, dysuria and hematuria.  Musculoskeletal: Positive for arthralgias. Negative for gait problem.  Skin: Negative for rash and wound.  Allergic/Immunologic: Positive for environmental allergies.  Neurological: Negative for syncope, weakness and headaches.  Hematological: Negative for adenopathy. Does not bruise/bleed easily.  Psychiatric/Behavioral: Positive for sleep disturbance. Negative for confusion. The patient is nervous/anxious.      Current Outpatient Medications on File Prior to Visit  Medication Sig Dispense Refill  . acetaminophen (TYLENOL) 500 MG tablet Take 500 mg by mouth 2 (two) times daily as needed (pain).    [name] Kitchen aspirin 81 MG chewable tablet Chew 1 tablet (81 mg total) by mouth daily.    . cyclobenzaprine (FLEXERIL) 5 MG tablet Take 1 tablet (5 mg total) by mouth 3 (three) times daily as needed for muscle spasms. 30 tablet 1  . fluticasone (FLONASE) 50 MCG/ACT nasal spray Place 1 spray into both nostrils 2 (two) times daily as needed for allergies. 32 g 7  . gabapentin (NEURONTIN) 300 MG capsule Take 1 capsule (300 mg  total) by mouth at bedtime. 90 capsule 4  . metoprolol tartrate (LOPRESSOR) 25 MG tablet Take 0.5 tablets (12.5 mg total) by mouth 2 (two) times daily. 15 tablet 6  . Multiple Vitamin (MULTIVITAMIN) tablet Take 1 tablet by mouth daily.    . nitroGLYCERIN (NITROSTAT) 0.4 MG SL tablet Place 1 tablet (0.4 mg total) under the tongue every 5 (five) minutes as needed for chest pain. 30 tablet 6  . NONFORMULARY OR COMPOUNDED ITEM Rx for  compounded cream faxed to [facility]    . polyethylene glycol powder (GLYCOLAX/MIRALAX) powder Take 17 g by mouth daily. 500 g 3  . ranitidine (ZANTAC) 150 MG tablet TAKE 1 TABLET BY MOUTH TWICE A DAY 180 tablet 2  . rosuvastatin (CRESTOR) 20 MG tablet Take 1 tablet (20 mg total) by mouth daily at 6 PM. 90 tablet 3  . ticagrelor (BRILINTA) 90 MG TABS tablet Take 1 tablet (90 mg total) by mouth 2 (two) times daily. 60 tablet 10   No current facility-administered medications on file prior to visit.      Past Medical History:  Diagnosis Date  . Allergy   . Arthritis    RA (Dr. Ouida Sills)  . C. difficile diarrhea    history of  . CAD (coronary artery disease), native coronary artery    a. Cardiac CT with + FFR for Ramus b. cath 05/2017 100% sub acute lesion to mid circumflex artery s/p DES.  . CKD (chronic kidney disease) stage 3, GFR 30-59 ml/min ([place]) 10/11/2016   s/p R nephrectomy  . Colon polyps 2008   HYPERPLASTIC  . Costochondritis    a. Nuc Stress Test 6/16: EF 70, no scar or ischemia, low risk // b. Echo 3/16: mild conc LVH, EF 65-70, no RWMA, Gr 1 DD, mild TR  . Diabetes mellitus without complication ([place])    type 2  . Gastritis   . GERD (gastroesophageal reflux disease)   . History of echocardiogram    Echo 3/18:  Moderate LVH, EF 92-11, grade 1 diastolic dysfunction, calcified aortic valve, mild MR, moderate LAE  . History of nuclear stress test    Myoview 3/18: Mod size and intensity fixed septal defect, may be artifact.Opposite mod size and intensity lat defect, which is reversible and could represent ischemia or possibly artifact (SDS 4). LVEF 71% with normal wall motion. Intermediate risk study. >> images reviewed with Dr. Dorris Carnes - no sig ischemia; med rx   . History of pneumonia   . Hx of cardiovascular stress test    Lexiscan Myoview 6/16:  EF 70%, no scar or ischemia; Low Risk  . Hypertension   . Neuropathy   . Orthostatic hypotension 05/28/2017  .  Osteoarthritis   . S/P angioplasty with stent 05/27/17 to LCX with DES  05/28/2017  . Thyroid disease    Allergies  Allergen Reactions  . Percocet [Oxycodone-Acetaminophen] Hives, Itching and Other (See Comments)    hallucinations  . Pravachol [Pravastatin Sodium] Other (See Comments)    Muscle aches  . Hydrocodone Other (See Comments)    "crazy dreams"  . Aspirin Other (See Comments)    REACTION: GI upset    Social History   Socioeconomic History  . Marital status: Divorced    Spouse name: None  . Number of children: 3  . Years of education: 82  . Highest education level: None  Social Needs  . Financial resource strain: None  . Food insecurity - worry: None  . Food insecurity -  inability: None  . Transportation needs - medical: None  . Transportation needs - non-medical: None  Occupational History  . Occupation: Retired    Fish farm manager: RETIRED  Tobacco Use  . Smoking status: Former Smoker    Years: 15.00    Types: Cigarettes    Last attempt to quit: 05/27/2017    Years since quitting: 0.2  . Smokeless tobacco: Never Used  . Tobacco comment: 03/16/17  1 pk/week  Substance and Sexual Activity  . Alcohol use: No    Alcohol/week: 0.0 oz  . Drug use: No  . Sexual activity: None  Other Topics Concern  . None  Social History Narrative   Single, dgtr lives with her   Never Smoked    Alcohol use- no   Drug use-no   Regular Exercise-yes   Former Smoker- 12/2008    Vitals:   09/06/17 0859  BP: 130/82  Pulse: 64  Temp: 97.8 F (36.6 C)  SpO2: 99%   Body mass index is 34.86 kg/m.  Wt Readings from Last 3 Encounters:  09/06/17 216 lb (98 kg)  06/27/17 214 lb (97.1 kg)  06/10/17 212 lb 1.9 oz (96.2 kg)     Physical Exam  Nursing note and vitals reviewed. Constitutional: She is oriented to person, place, and time. She appears well-developed. No distress.  HENT:  Head: Normocephalic and atraumatic.  Nose: Rhinorrhea present. Right sinus exhibits no maxillary  sinus tenderness and no frontal sinus tenderness. Left sinus exhibits no maxillary sinus tenderness and no frontal sinus tenderness.  Mouth/Throat: Oropharynx is clear and moist and mucous membranes are normal.  Nasal voice. Post nasal drainage.  Eyes: Conjunctivae are normal. Pupils are equal, round, and reactive to light.  Cardiovascular: Normal rate and regular rhythm.  No murmur heard. Pulses:      Dorsalis pedis pulses are 2+ on the right side, and 2+ on the left side.  Respiratory: Effort normal and breath sounds normal. No respiratory distress.  GI: Soft. She exhibits no mass. There is no hepatomegaly. There is no tenderness.  Musculoskeletal: She exhibits no edema or tenderness.  Lymphadenopathy:    She has no cervical adenopathy.  Neurological: She is alert and oriented to person, place, and time. She has normal strength. Coordination normal.  Skin: Skin is warm. No rash noted. No erythema.  Psychiatric: Her mood appears anxious.  Well groomed, good eye contact.      ASSESSMENT AND PLAN:   Ms. RILY NICKEY was seen today for 6 months follow-up.  Orders Placed This Encounter  Procedures  . Basic metabolic panel  . Lipid panel  . POC Influenza A&B ([place] test)    CKD (chronic kidney disease) stage 3, GFR 30-59 ml/min ([facility]) Avoid NSAID's. Low salt diet. Adequate BP controlled. She is not on ACEI or ARB.  Essential hypertension Adequately controlled. No changes in current management. DASH-low salt diet recommended. Eye exam recommended annually, she has appt coming. F/U in 6 months, before if needed.   Hyperlipidemia associated with type 2 diabetes mellitus ([place]) She is not fasting today,so labs in 2 days. Further recommendations will be given accordingly. Continue Crestor 20 mg and encouraged to also follow low fat diet.   Allergic rhinitis Nasal congestion and rhinorrhea could be related to this problem but can also be the beginning of a viral  illness.   Coronary artery disease involving native coronary artery of native heart with unstable angina pectoris ([place]) Continue cardiac rehab. She has an appt with cardiologist scheduled.  Continue monitoring episodes of palpitations, which are not associated with other symptoms. ? Anxiety. Instructed about warning signs. Continue Metoprolol.   Nausea without vomiting  Possible causes discussed. ? GI etiology,Hx of GERD. ? Viral illness, monitor for new symptoms. Today rapid flu test negative.  Instructed about warning signs.  F/U as needed.       -Ms. [name] was advised to return sooner than planned today if new concerns arise.       [name]. [name], MD  [facility]. [place] office.

## 2017-09-06 ENCOUNTER — Ambulatory Visit (INDEPENDENT_AMBULATORY_CARE_PROVIDER_SITE_OTHER): Payer: Medicare HMO | Admitting: Family Medicine

## 2017-09-06 ENCOUNTER — Encounter: Payer: Self-pay | Admitting: Family Medicine

## 2017-09-06 VITALS — BP 130/82 | HR 64 | Temp 97.8°F | Ht 66.0 in | Wt 216.0 lb

## 2017-09-06 DIAGNOSIS — R110 Nausea: Secondary | ICD-10-CM | POA: Diagnosis not present

## 2017-09-06 DIAGNOSIS — Z6834 Body mass index (BMI) 34.0-34.9, adult: Secondary | ICD-10-CM

## 2017-09-06 DIAGNOSIS — N183 Chronic kidney disease, stage 3 unspecified: Secondary | ICD-10-CM

## 2017-09-06 DIAGNOSIS — E785 Hyperlipidemia, unspecified: Secondary | ICD-10-CM

## 2017-09-06 DIAGNOSIS — I10 Essential (primary) hypertension: Secondary | ICD-10-CM | POA: Diagnosis not present

## 2017-09-06 DIAGNOSIS — I25110 Atherosclerotic heart disease of native coronary artery with unstable angina pectoris: Secondary | ICD-10-CM | POA: Diagnosis not present

## 2017-09-06 DIAGNOSIS — J3089 Other allergic rhinitis: Secondary | ICD-10-CM

## 2017-09-06 DIAGNOSIS — E6609 Other obesity due to excess calories: Secondary | ICD-10-CM | POA: Diagnosis not present

## 2017-09-06 DIAGNOSIS — E1169 Type 2 diabetes mellitus with other specified complication: Secondary | ICD-10-CM

## 2017-09-06 LAB — POC INFLUENZA A&B (BINAX/QUICKVUE)
Influenza A, POC: NEGATIVE
Influenza B, POC: NEGATIVE

## 2017-09-06 NOTE — Assessment & Plan Note (Signed)
She is not fasting today,so labs in 2 days. Further recommendations will be given accordingly. Continue Crestor 20 mg and encouraged to also follow low fat diet.

## 2017-09-06 NOTE — Patient Instructions (Addendum)
A few things to remember from today's visit:   CKD (chronic kidney disease) stage 3, GFR 30-59 ml/min (HCC)  Essential hypertension  Nausea without vomiting  Hyperlipidemia associated with type 2 diabetes mellitus (HCC)  Non-seasonal allergic rhinitis, unspecified trigger  Class 1 obesity due to excess calories with serious comorbidity and body mass index (BMI) of 34.0 to 34.9 in adult  Coronary artery disease involving native coronary artery of native heart with unstable angina pectoris ([place])  Labs in 2 days. Monitor for new symptoms, symptoms could be allergies or a viral illness.    Please be sure medication list is accurate. If a new problem present, please set up appointment sooner than planned today.

## 2017-09-06 NOTE — Assessment & Plan Note (Signed)
Avoid NSAID's. Low salt diet. Adequate BP controlled. She is not on ACEI or ARB.

## 2017-09-06 NOTE — Assessment & Plan Note (Signed)
Nasal congestion and rhinorrhea could be related to this problem but can also be the beginning of a viral illness.

## 2017-09-06 NOTE — Assessment & Plan Note (Signed)
Adequately controlled. No changes in current management. DASH-low salt diet recommended. Eye exam recommended annually, she has appt coming. F/U in 6 months, before if needed.

## 2017-09-06 NOTE — Assessment & Plan Note (Signed)
Continue cardiac rehab. She has an appt with cardiologist scheduled.  Continue monitoring episodes of palpitations, which are not associated with other symptoms. ? Anxiety. Instructed about warning signs. Continue Metoprolol.

## 2017-09-07 ENCOUNTER — Encounter (HOSPITAL_COMMUNITY)
Admission: RE | Admit: 2017-09-07 | Discharge: 2017-09-07 | Disposition: A | Discharge status: Home / Self Care | Payer: Self-pay | Source: Ambulatory Visit | Attending: Internal Medicine | Admitting: Internal Medicine

## 2017-09-08 ENCOUNTER — Other Ambulatory Visit (INDEPENDENT_AMBULATORY_CARE_PROVIDER_SITE_OTHER): Payer: Medicare HMO

## 2017-09-08 DIAGNOSIS — I10 Essential (primary) hypertension: Secondary | ICD-10-CM | POA: Diagnosis not present

## 2017-09-08 DIAGNOSIS — E785 Hyperlipidemia, unspecified: Secondary | ICD-10-CM

## 2017-09-08 DIAGNOSIS — N183 Chronic kidney disease, stage 3 unspecified: Secondary | ICD-10-CM

## 2017-09-08 DIAGNOSIS — E1169 Type 2 diabetes mellitus with other specified complication: Secondary | ICD-10-CM

## 2017-09-08 LAB — LIPID PANEL
Cholesterol: 148 mg/dL (ref 0–200)
HDL: 59.9 mg/dL (ref >39.00)
LDL Cholesterol: 72 mg/dL (ref 0–99)
NonHDL: 88.32
Total CHOL/HDL Ratio: 2
Triglycerides: 83 mg/dL (ref 0.0–149.0)
VLDL: 16.6 mg/dL (ref 0.0–40.0)

## 2017-09-08 LAB — BASIC METABOLIC PANEL
BUN: 18 mg/dL (ref 6–23)
CO2: 28 mEq/L (ref 19–32)
Calcium: 9.6 mg/dL (ref 8.4–10.5)
Chloride: 105 mEq/L (ref 96–112)
Creatinine, Ser: 1.16 mg/dL (ref 0.40–1.20)
GFR: 58.79 mL/min — ABNORMAL LOW (ref >60.00)
Glucose, Bld: 90 mg/dL (ref 70–99)
Potassium: 4.1 mEq/L (ref 3.5–5.1)
Sodium: 140 mEq/L (ref 135–145)

## 2017-09-09 ENCOUNTER — Encounter (HOSPITAL_COMMUNITY)
Admission: RE | Admit: 2017-09-09 | Discharge: 2017-09-09 | Disposition: A | Discharge status: Home / Self Care | Payer: Self-pay | Source: Ambulatory Visit | Attending: Internal Medicine | Admitting: Internal Medicine

## 2017-09-11 ENCOUNTER — Encounter: Payer: Self-pay | Admitting: Family Medicine

## 2017-09-12 ENCOUNTER — Encounter: Payer: Self-pay | Admitting: Internal Medicine

## 2017-09-12 ENCOUNTER — Ambulatory Visit: Payer: Medicare HMO | Admitting: Internal Medicine

## 2017-09-12 ENCOUNTER — Encounter (HOSPITAL_COMMUNITY): Payer: Self-pay

## 2017-09-12 VITALS — BP 148/84 | HR 67 | Ht 66.0 in | Wt 217.8 lb

## 2017-09-12 DIAGNOSIS — E785 Hyperlipidemia, unspecified: Secondary | ICD-10-CM

## 2017-09-12 DIAGNOSIS — I25110 Atherosclerotic heart disease of native coronary artery with unstable angina pectoris: Secondary | ICD-10-CM | POA: Diagnosis not present

## 2017-09-12 DIAGNOSIS — I10 Essential (primary) hypertension: Secondary | ICD-10-CM | POA: Diagnosis not present

## 2017-09-12 NOTE — Progress Notes (Signed)
Cardiology Office Note   Date:  09/12/2017   ID:  [name] 29-Dec-1945, MRN [id number]  PCP:  [name], [name], MD  Cardiologist:   Dorris Carnes, MD   F/U of CAD      History of Present Illness: [name] is a 72 y.o. female with a history of HTN, DM, 1 kidney  And RA ALso history of CP   She was admitted on October  For CP  CT neg for PE  Cardiac CT with positive FFR for ramus.Cath showed 100% sub acute lesion in mid LCx  Underwent PTCA/DES.  Ehco showed normal LVEF .   She was last seen in f/u by B Bhagat in November .  Tolerating Crestor.  Occasional SOB  Since seen she denies CP  Still gets some SOB  She attrib to discomfort from RA She was seen in rheum clinic at [facility]  Had injection of L wrist  Worse    Current Meds  Medication Sig  . acetaminophen (TYLENOL) 500 MG tablet Take 500 mg by mouth 2 (two) times daily as needed (pain).  [name] Kitchen aspirin 81 MG chewable tablet Chew 1 tablet (81 mg total) by mouth daily.  . cyclobenzaprine (FLEXERIL) 5 MG tablet Take 1 tablet (5 mg total) by mouth 3 (three) times daily as needed for muscle spasms.  . fluticasone (FLONASE) 50 MCG/ACT nasal spray Place 1 spray into both nostrils 2 (two) times daily as needed for allergies.  [name] Kitchen gabapentin (NEURONTIN) 300 MG capsule Take 1 capsule (300 mg total) by mouth at bedtime.  . metoprolol tartrate (LOPRESSOR) 25 MG tablet Take 0.5 tablets (12.5 mg total) by mouth 2 (two) times daily.  . Multiple Vitamin (MULTIVITAMIN) tablet Take 1 tablet by mouth daily.  . nitroGLYCERIN (NITROSTAT) 0.4 MG SL tablet Place 1 tablet (0.4 mg total) under the tongue every 5 (five) minutes as needed for chest pain.  . NONFORMULARY OR COMPOUNDED ITEM Rx for compounded cream faxed to [place]  . polyethylene glycol powder (GLYCOLAX/MIRALAX) powder Take 17 Blackwell by mouth daily.  . ranitidine (ZANTAC) 150 MG tablet TAKE 1 TABLET BY MOUTH TWICE A DAY  . rosuvastatin (CRESTOR) 20 MG tablet Take 1 tablet (20 mg total) by  mouth daily at 6 PM.  . ticagrelor (BRILINTA) 90 MG TABS tablet Take 1 tablet (90 mg total) by mouth 2 (two) times daily.     Allergies:   Percocet [oxycodone-acetaminophen]; Pravachol [pravastatin sodium]; Hydrocodone; and Aspirin   Past Medical History:  Diagnosis Date  . Allergy   . Arthritis    RA (Dr. Ouida Sills)  . C. difficile diarrhea    history of  . CAD (coronary artery disease), native coronary artery    a. Cardiac CT with + FFR for Ramus b. cath 05/2017 100% sub acute lesion to mid circumflex artery s/p DES.  . CKD (chronic kidney disease) stage 3, GFR 30-59 ml/min ([place]) 10/11/2016   s/p R nephrectomy  . Colon polyps 2008   HYPERPLASTIC  . Costochondritis    a. Nuc Stress Test 6/16: EF 70, no scar or ischemia, low risk // b. Echo 3/16: mild conc LVH, EF 65-70, no RWMA, Gr 1 DD, mild TR  . Diabetes mellitus without complication ([place])    type 2  . Gastritis   . GERD (gastroesophageal reflux disease)   . History of echocardiogram    Echo 3/18:  Moderate LVH, EF 53-29, grade 1 diastolic dysfunction, calcified aortic valve, mild MR, moderate LAE  .  History of nuclear stress test    Myoview 3/18: Mod size and intensity fixed septal defect, may be artifact.Opposite mod size and intensity lat defect, which is reversible and could represent ischemia or possibly artifact (SDS 4). LVEF 71% with normal wall motion. Intermediate risk study. >> images reviewed with Dr. Dorris Carnes - no sig ischemia; med rx   . History of pneumonia   . Hx of cardiovascular stress test    Lexiscan Myoview 6/16:  EF 70%, no scar or ischemia; Low Risk  . Hypertension   . Neuropathy   . Orthostatic hypotension 05/28/2017  . Osteoarthritis   . S/P angioplasty with stent 05/27/17 to LCX with DES  05/28/2017  . Thyroid disease     Past Surgical History:  Procedure Laterality Date  . ABDOMINAL HYSTERECTOMY  1978  . BACK SURGERY     x 2  . CARPAL TUNNEL RELEASE Left   . COLONOSCOPY W/ BIOPSIES AND  POLYPECTOMY     Hx: of  . CORONARY STENT INTERVENTION N/A 05/27/2017   Procedure: CORONARY STENT INTERVENTION;  Surgeon: Burnell Blanks, MD;  Location: [place] [facility];  Service: Cardiovascular;  Laterality: N/A;  . HNP    . LEFT HEART CATH AND CORONARY ANGIOGRAPHY N/A 05/27/2017   Procedure: LEFT HEART CATH AND CORONARY ANGIOGRAPHY;  Surgeon: Burnell Blanks, MD;  Location: [place] [facility];  Service: Cardiovascular;  Laterality: N/A;  . LUMBAR LAMINECTOMY/DECOMPRESSION MICRODISCECTOMY Left 02/12/2013   Procedure: LUMBAR TWO THREE, LUMBAR THREE FOUR, LUMBAR FOUR FIVE  LAMINECTOMY/DECOMPRESSION MICRODISCECTOMY 3 LEVELS;  Surgeon: Charlie Pitter, MD;  Location: [place] [facility];  Service: Neurosurgery;  Laterality: Left;  . NEPHRECTOMY Right 2010   10.rcc cancer  . TOTAL KNEE ARTHROPLASTY Right    Redo     Social History:  The patient  reports that she quit smoking about 3 months ago. Her smoking use included cigarettes. She quit after 15.00 years of use. she has never used smokeless tobacco. She reports that she does not drink alcohol or use drugs.   Family History:  The patient's family history includes Cancer in her brother; Dementia in her brother and brother; Diabetes in her brother and brother; Heart disease in her father; Kidney disease in her son; Parkinsonism in her brother; Prostate cancer in her father; Rheum arthritis in her mother; Stroke in her mother.    ROS:  Please see the history of present illness. All other systems are reviewed and  Negative to the above problem except as noted.    PHYSICAL EXAM: VS:  BP (!) 148/84   Pulse 67   Ht 5\' 6"  (1.676 m)   Wt 217 lb 12.8 oz (98.8 kg)   SpO2 93%   BMI 35.15 kg/m   GEN: Well nourished, well developed, in no acute distress  HEENT: normal  Neck: no JVD, carotid bruits, or masses Cardiac: RRR; no murmurs, rubs, or gallops,no edema  Respiratory:  clear to auscultation bilaterally, normal work of breathing GI:  soft, nontender, nondistended, + BS  No hepatomegaly  MS: no deformity Moving all extremities   Skin: warm and dry, no rash Neuro:  Strength and sensation are intact Psych: euthymic mood, full affect   EKG:  EKG is ordered today.   Lipid Panel    Component Value Date/Time   CHOL 148 09/08/2017 1018   TRIG 83.0 09/08/2017 1018   HDL 59.90 09/08/2017 1018   CHOLHDL 2 09/08/2017 1018   VLDL 16.6 09/08/2017 1018  LDLCALC 72 09/08/2017 1018   LDLDIRECT 171.5 12/06/2011 0812      Wt Readings from Last 3 Encounters:  09/12/17 217 lb 12.8 oz (98.8 kg)  09/06/17 216 lb (98 kg)  06/27/17 214 lb (97.1 kg)      ASSESSMENT AND PLAN:  1  CAD  S/P intervention in October  DOing good  Keep on DAPT for now.  2  HL  Last lipisd LDL 72 Down from 172 5 months ago  Keep on Crestor  3  HTN  BP is up a little  I would follow for now   F/U in 6 months     Current medicines are reviewed at length with the patient today.  The patient does not have concerns regarding medicines.  Signed, Dorris Carnes, MD  09/12/2017 10:15 AM    [address], [place], [place]  [address] Phone: [phone]; Fax: [phone]

## 2017-09-12 NOTE — Patient Instructions (Signed)
Your physician recommends that you continue on your current medications as directed. Please refer to the Current Medication list given to you today. Your physician wants you to follow-up in: September, 2019 with Dr. Ross.  You will receive a reminder letter in the mail two months in advance. If you don't receive a letter, please call our office to schedule the follow-up appointment.  

## 2017-09-14 ENCOUNTER — Encounter (HOSPITAL_COMMUNITY): Payer: Self-pay

## 2017-09-16 ENCOUNTER — Encounter: Payer: Self-pay | Admitting: Family Medicine

## 2017-09-16 ENCOUNTER — Ambulatory Visit (INDEPENDENT_AMBULATORY_CARE_PROVIDER_SITE_OTHER): Payer: Medicare HMO | Admitting: Family Medicine

## 2017-09-16 ENCOUNTER — Encounter (HOSPITAL_COMMUNITY): Payer: Self-pay

## 2017-09-16 VITALS — BP 128/76 | HR 68 | Temp 97.9°F | Resp 12 | Ht 66.0 in | Wt 216.2 lb

## 2017-09-16 DIAGNOSIS — M542 Cervicalgia: Secondary | ICD-10-CM | POA: Diagnosis not present

## 2017-09-16 DIAGNOSIS — G629 Polyneuropathy, unspecified: Secondary | ICD-10-CM

## 2017-09-16 MED ORDER — DULOXETINE HCL 30 MG PO CPEP: 30.0000 mg | ORAL_CAPSULE | Freq: Every day | ORAL | 1 refills | Status: DC

## 2017-09-16 MED ORDER — TIZANIDINE HCL 2 MG PO CAPS: 2.0000 mg | ORAL_CAPSULE | Freq: Three times a day (TID) | ORAL | 1 refills | Status: DC

## 2017-09-16 NOTE — Patient Instructions (Addendum)
A few things to remember from today's visit:   Cervicalgia - Plan: tizanidine (ZANAFLEX) 2 MG capsule, DULoxetine (CYMBALTA) 30 MG capsule, Ambulatory referral to Physical Therapy  Cymbalta and Zanaflex started today.  Flexeril stopped.   Please be sure medication list is accurate. If a new problem present, please set up appointment sooner than planned today.

## 2017-09-16 NOTE — Assessment & Plan Note (Signed)
Chronic. We discussed treatment option and some side effects. She agrees with trying Cymbalta 30 mg daily. Zanaflex 2 mg tid as needed. She understands side effects. PT will be arranged. Local ice to continue. F/U in 3-4 weeks.

## 2017-09-16 NOTE — Progress Notes (Signed)
ACUTE VISIT   HPI:  Chief Complaint  Patient presents with  . Neck Pain    started Tuesday, got worse lastnight, used heat and ice to back of neck    [name] is a 72 y.o. female, who is here today complaining of 2-3 days of constant neck pain,worse last night. Bilateral and no radiated. She has Hx of neck pain,intermittent for a while. No Hx of trauma. Hx of RA, she follows with rheuma.  Pain is sharp,achy, 8/10. Exacerbated by movements, which is limited. Alleviated by local ice. She has not tried OTC medication.  She wonders if neck pain is causing neuropathy. She follows with neurologist, Dr Tish Frederickson for axonal neuropathy and carpal tunnel synd. EMG in 10/19/16 showed diffused widespread axonal polyneuropathy She is on Gabapentin 300 mg at bedtime,which has helped with numbness.    Review of Systems  Constitutional: Positive for fatigue. Negative for appetite change, fever and unexpected weight change.  HENT: Negative for mouth sores, sore throat, trouble swallowing and voice change.   Respiratory: Negative for cough, shortness of breath and wheezing.   Cardiovascular: Negative for chest pain.  Gastrointestinal: Negative for nausea and vomiting.  Musculoskeletal: Positive for arthralgias and neck pain. Negative for joint swelling.  Skin: Negative for rash.  Neurological: Negative for syncope, weakness and headaches.  Hematological: Negative for adenopathy. Does not bruise/bleed easily.  Psychiatric/Behavioral: Positive for sleep disturbance. Negative for confusion. The patient is nervous/anxious.       Current Outpatient Medications on File Prior to Visit  Medication Sig Dispense Refill  . acetaminophen (TYLENOL) 500 MG tablet Take 500 mg by mouth 2 (two) times daily as needed (pain).    [name] Kitchen aspirin 81 MG chewable tablet Chew 1 tablet (81 mg total) by mouth daily.    . fluticasone (FLONASE) 50 MCG/ACT nasal spray Place 1 spray into both nostrils 2 (two)  times daily as needed for allergies. 32 g 7  . gabapentin (NEURONTIN) 300 MG capsule Take 1 capsule (300 mg total) by mouth at bedtime. 90 capsule 4  . metoprolol tartrate (LOPRESSOR) 25 MG tablet Take 0.5 tablets (12.5 mg total) by mouth 2 (two) times daily. 15 tablet 6  . Multiple Vitamin (MULTIVITAMIN) tablet Take 1 tablet by mouth daily.    . nitroGLYCERIN (NITROSTAT) 0.4 MG SL tablet Place 1 tablet (0.4 mg total) under the tongue every 5 (five) minutes as needed for chest pain. 30 tablet 6  . NONFORMULARY OR COMPOUNDED ITEM Rx for compounded cream faxed to [facility]    . polyethylene glycol powder (GLYCOLAX/MIRALAX) powder Take 17 g by mouth daily. 500 g 3  . ranitidine (ZANTAC) 150 MG tablet TAKE 1 TABLET BY MOUTH TWICE A DAY 180 tablet 2  . rosuvastatin (CRESTOR) 20 MG tablet Take 1 tablet (20 mg total) by mouth daily at 6 PM. 90 tablet 3  . ticagrelor (BRILINTA) 90 MG TABS tablet Take 1 tablet (90 mg total) by mouth 2 (two) times daily. 60 tablet 10   No current facility-administered medications on file prior to visit.      Past Medical History:  Diagnosis Date  . Allergy   . Arthritis    RA (Dr. Ouida Sills)  . C. difficile diarrhea    history of  . CAD (coronary artery disease), native coronary artery    a. Cardiac CT with + FFR for Ramus b. cath 05/2017 100% sub acute lesion to mid circumflex artery s/p DES.  . CKD (chronic  kidney disease) stage 3, GFR 30-59 ml/min ([place]) 10/11/2016   s/p R nephrectomy  . Colon polyps 2008   HYPERPLASTIC  . Costochondritis    a. Nuc Stress Test 6/16: EF 70, no scar or ischemia, low risk // b. Echo 3/16: mild conc LVH, EF 65-70, no RWMA, Gr 1 DD, mild TR  . Diabetes mellitus without complication ([place])    type 2  . Gastritis   . GERD (gastroesophageal reflux disease)   . History of echocardiogram    Echo 3/18:  Moderate LVH, EF 74-08, grade 1 diastolic dysfunction, calcified aortic valve, mild MR, moderate LAE  . History of nuclear stress  test    Myoview 3/18: Mod size and intensity fixed septal defect, may be artifact.Opposite mod size and intensity lat defect, which is reversible and could represent ischemia or possibly artifact (SDS 4). LVEF 71% with normal wall motion. Intermediate risk study. >> images reviewed with Dr. Dorris Carnes - no sig ischemia; med rx   . History of pneumonia   . Hx of cardiovascular stress test    Lexiscan Myoview 6/16:  EF 70%, no scar or ischemia; Low Risk  . Hypertension   . Neuropathy   . Orthostatic hypotension 05/28/2017  . Osteoarthritis   . S/P angioplasty with stent 05/27/17 to LCX with DES  05/28/2017  . Thyroid disease    Allergies  Allergen Reactions  . Percocet [Oxycodone-Acetaminophen] Hives, Itching and Other (See Comments)    hallucinations  . Pravachol [Pravastatin Sodium] Other (See Comments)    Muscle aches  . Hydrocodone Other (See Comments)    "crazy dreams"  . Aspirin Other (See Comments)    REACTION: GI upset    Social History   Socioeconomic History  . Marital status: Divorced    Spouse name: None  . Number of children: 3  . Years of education: 88  . Highest education level: None  Social Needs  . Financial resource strain: None  . Food insecurity - worry: None  . Food insecurity - inability: None  . Transportation needs - medical: None  . Transportation needs - non-medical: None  Occupational History  . Occupation: Retired    Fish farm manager: RETIRED  Tobacco Use  . Smoking status: Former Smoker    Years: 15.00    Types: Cigarettes    Last attempt to quit: 05/27/2017    Years since quitting: 0.3  . Smokeless tobacco: Never Used  . Tobacco comment: 03/16/17  1 pk/week  Substance and Sexual Activity  . Alcohol use: No    Alcohol/week: 0.0 oz  . Drug use: No  . Sexual activity: None  Other Topics Concern  . None  Social History Narrative   Single, dgtr lives with her   Never Smoked    Alcohol use- no   Drug use-no   Regular Exercise-yes   Former  Smoker- 12/2008    Vitals:   09/16/17 1112  BP: 128/76  Pulse: 68  Resp: 12  Temp: 97.9 F (36.6 C)  SpO2: 98%   Body mass index is 34.9 kg/m.   Physical Exam  Nursing note and vitals reviewed. Constitutional: She is oriented to person, place, and time. She appears well-developed. She does not appear ill. No distress.  HENT:  Head: Atraumatic.  Mouth/Throat: Oropharynx is clear and moist and mucous membranes are normal.  Eyes: Conjunctivae are normal. Pupils are equal, round, and reactive to light.  Respiratory: Effort normal and breath sounds normal. No respiratory distress.  Musculoskeletal: She exhibits  no edema.       Cervical back: She exhibits decreased range of motion and tenderness. She exhibits no bony tenderness.  Tenderness upon palpation of left trapezium muscle and paraspinal cervical muscles. + Muscle spasms. Limitation of ROM,mainly left side rotation. ROM exacerbates pain.    Lymphadenopathy:    She has no cervical adenopathy.  Neurological: She is alert and oriented to person, place, and time. She has normal strength. Gait normal.  SLR negative bilateral.  Skin: Skin is warm. No rash noted. No erythema.  Psychiatric: Her mood appears anxious.  Well groomed, good eye contact.     ASSESSMENT AND PLAN:   Ms. Yesmin was seen today for neck pain.  Orders Placed This Encounter  Procedures  . Ambulatory referral to Physical Therapy    Cervicalgia Chronic. We discussed treatment option and some side effects. She agrees with trying Cymbalta 30 mg daily. Zanaflex 2 mg tid as needed. She understands side effects. PT will be arranged. Local ice to continue. F/U in 3-4 weeks.  Polyneuropathy Continue Gabapentin. Cymbalta may help. I do not think neck pain is related. Continue following with neuro.    Return in about 4 weeks (around 10/14/2017).     -Ms.[name] was advised to seek immediate medical attention if sudden worsening  symptoms.      [name]. [name], MD  [facility]. [place] office.

## 2017-09-16 NOTE — Assessment & Plan Note (Signed)
Continue Gabapentin. Cymbalta may help. I do not think neck pain is related. Continue following with neuro.

## 2017-09-17 ENCOUNTER — Encounter: Payer: Self-pay | Admitting: Family Medicine

## 2017-09-19 ENCOUNTER — Encounter (HOSPITAL_COMMUNITY): Payer: Self-pay

## 2017-09-19 ENCOUNTER — Ambulatory Visit: Payer: Medicare HMO | Admitting: Adult Health

## 2017-09-19 ENCOUNTER — Encounter: Payer: Self-pay | Admitting: Adult Health

## 2017-09-19 VITALS — BP 146/94 | HR 56 | Ht 66.0 in | Wt 218.8 lb

## 2017-09-19 DIAGNOSIS — G5602 Carpal tunnel syndrome, left upper limb: Secondary | ICD-10-CM

## 2017-09-19 DIAGNOSIS — G6289 Other specified polyneuropathies: Secondary | ICD-10-CM | POA: Diagnosis not present

## 2017-09-19 NOTE — Progress Notes (Signed)
I reviewed note and agree with plan.   Penni Bombard, MD 0/98/1191, 4:78 AM Certified in Neurology, Neurophysiology and Neuroimaging  [facility] [address] [place], [place] [address] [phone]

## 2017-09-19 NOTE — Patient Instructions (Addendum)
Your Plan:  Continue gabapentin 300 mg at bedtime and compounded cream If your symptoms worsen or you develop new symptoms please let us know.    Thank you for coming to see [place] at [facility]. I hope we have been able to provide you high quality care today.  You may receive a patient satisfaction survey over the next few weeks. We would appreciate your feedback and comments so that we may continue to improve ourselves and the health of our patients.

## 2017-09-19 NOTE — Progress Notes (Signed)
PATIENT: [name] DOB: 1945/12/20  REASON FOR VISIT: follow up HISTORY FROM: patient  HISTORY OF PRESENT ILLNESS: Today 09/19/17: [name] is a 72 year old female with a history of axonal neuropathy and left carpal tunnel.  She returns today for follow-up.  She states overall she is doing well.  She does have burning and tingling in the left arm that is worse at night.  She also reports she occasionally has burning in the feet.  She states the compounded cream and gabapentin has been beneficial.  She did see hand surgery for carpal tunnel but he did not feel that it was severe enough to complete surgery per the patient.  The patient also reports that she is been having neck pain.  She did see her primary care who felt that it was muscle spasms and placed on tizanidine.  She returns today for evaluation.   HISTORY 03/16/17: Since last visit, doing a little better with neuropathy cream and gabapentin. Sxs stable. Planning to get second opinion [facility] rheumatology clinic.   PRIOR HPI (12/01/16): 72 year old female with history of rheumatoid arthritis, here for evaluation of left hand numbness and pain. Patient reports numbness in bilateral hands and feet, especially left hand for past 6 months. She describes pain and tenderness in her left fifth digit radiating up to her left elbow. Patient denies any recent accidents or traumas. Patient has history of diabetes, hypertension, renal cell carcinoma, rheumatoid arthritis, currently on leflunomide. Patient presented for EMG nerve conduction study on 10/14/16 which showed diffuse widespread axonal polyneuropathy as well as superimposed left carpal tunnel syndrome. Patient has history of right carpal tunnel syndrome status post surgery with good results.     REVIEW OF SYSTEMS: Out of a complete 14 system review of symptoms, the patient complains only of the following symptoms, and all other reviewed systems are negative.  See  HPI  ALLERGIES: Allergies  Allergen Reactions  . Percocet [Oxycodone-Acetaminophen] Hives, Itching and Other (See Comments)    hallucinations  . Pravachol [Pravastatin Sodium] Other (See Comments)    Muscle aches  . Hydrocodone Other (See Comments)    "crazy dreams"  . Aspirin Other (See Comments)    REACTION: GI upset    HOME MEDICATIONS: Outpatient Medications Prior to Visit  Medication Sig Dispense Refill  . acetaminophen (TYLENOL) 500 MG tablet Take 500 mg by mouth 2 (two) times daily as needed (pain).    [name] Kitchen aspirin 81 MG chewable tablet Chew 1 tablet (81 mg total) by mouth daily.    . DULoxetine (CYMBALTA) 30 MG capsule Take 1 capsule (30 mg total) by mouth daily. 30 capsule 1  . fluticasone (FLONASE) 50 MCG/ACT nasal spray Place 1 spray into both nostrils 2 (two) times daily as needed for allergies. 32 g 7  . gabapentin (NEURONTIN) 300 MG capsule Take 1 capsule (300 mg total) by mouth at bedtime. 90 capsule 4  . metoprolol tartrate (LOPRESSOR) 25 MG tablet Take 0.5 tablets (12.5 mg total) by mouth 2 (two) times daily. 15 tablet 6  . Multiple Vitamin (MULTIVITAMIN) tablet Take 1 tablet by mouth daily.    . nitroGLYCERIN (NITROSTAT) 0.4 MG SL tablet Place 1 tablet (0.4 mg total) under the tongue every 5 (five) minutes as needed for chest pain. 30 tablet 6  . NONFORMULARY OR COMPOUNDED ITEM Rx for compounded cream faxed to [place]    . polyethylene glycol powder (GLYCOLAX/MIRALAX) powder Take 17 g by mouth daily. 500 g 3  . ranitidine (ZANTAC)  150 MG tablet TAKE 1 TABLET BY MOUTH TWICE A DAY 180 tablet 2  . rosuvastatin (CRESTOR) 20 MG tablet Take 1 tablet (20 mg total) by mouth daily at 6 PM. 90 tablet 3  . ticagrelor (BRILINTA) 90 MG TABS tablet Take 1 tablet (90 mg total) by mouth 2 (two) times daily. 60 tablet 10  . tizanidine (ZANAFLEX) 2 MG capsule Take 1 capsule (2 mg total) by mouth 3 (three) times daily. 45 capsule 1   No facility-administered medications prior to  visit.     PAST MEDICAL HISTORY: Past Medical History:  Diagnosis Date  . Allergy   . Arthritis    RA (Dr. Ouida Sills)  . C. difficile diarrhea    history of  . CAD (coronary artery disease), native coronary artery    a. Cardiac CT with + FFR for Ramus b. cath 05/2017 100% sub acute lesion to mid circumflex artery s/p DES.  . CKD (chronic kidney disease) stage 3, GFR 30-59 ml/min ([place]) 10/11/2016   s/p R nephrectomy  . Colon polyps 2008   HYPERPLASTIC  . Costochondritis    a. Nuc Stress Test 6/16: EF 70, no scar or ischemia, low risk // b. Echo 3/16: mild conc LVH, EF 65-70, no RWMA, Gr 1 DD, mild TR  . Diabetes mellitus without complication ([place])    type 2  . Gastritis   . GERD (gastroesophageal reflux disease)   . History of echocardiogram    Echo 3/18:  Moderate LVH, EF 25-95, grade 1 diastolic dysfunction, calcified aortic valve, mild MR, moderate LAE  . History of nuclear stress test    Myoview 3/18: Mod size and intensity fixed septal defect, may be artifact.Opposite mod size and intensity lat defect, which is reversible and could represent ischemia or possibly artifact (SDS 4). LVEF 71% with normal wall motion. Intermediate risk study. >> images reviewed with Dr. Dorris Carnes - no sig ischemia; med rx   . History of pneumonia   . Hx of cardiovascular stress test    Lexiscan Myoview 6/16:  EF 70%, no scar or ischemia; Low Risk  . Hypertension   . Neuropathy   . Orthostatic hypotension 05/28/2017  . Osteoarthritis   . S/P angioplasty with stent 05/27/17 to LCX with DES  05/28/2017  . Thyroid disease     PAST SURGICAL HISTORY: Past Surgical History:  Procedure Laterality Date  . ABDOMINAL HYSTERECTOMY  1978  . BACK SURGERY     x 2  . CARPAL TUNNEL RELEASE Left   . COLONOSCOPY W/ BIOPSIES AND POLYPECTOMY     Hx: of  . CORONARY STENT INTERVENTION N/A 05/27/2017   Procedure: CORONARY STENT INTERVENTION;  Surgeon: Burnell Blanks, MD;  Location: [place] [facility];   Service: Cardiovascular;  Laterality: N/A;  . HNP    . LEFT HEART CATH AND CORONARY ANGIOGRAPHY N/A 05/27/2017   Procedure: LEFT HEART CATH AND CORONARY ANGIOGRAPHY;  Surgeon: Burnell Blanks, MD;  Location: [place] [facility];  Service: Cardiovascular;  Laterality: N/A;  . LUMBAR LAMINECTOMY/DECOMPRESSION MICRODISCECTOMY Left 02/12/2013   Procedure: LUMBAR TWO THREE, LUMBAR THREE FOUR, LUMBAR FOUR FIVE  LAMINECTOMY/DECOMPRESSION MICRODISCECTOMY 3 LEVELS;  Surgeon: Charlie Pitter, MD;  Location: [place] [facility];  Service: Neurosurgery;  Laterality: Left;  . NEPHRECTOMY Right 2010   10.rcc cancer  . TOTAL KNEE ARTHROPLASTY Right    Redo    FAMILY HISTORY: Family History  Problem Relation Age of Onset  . Rheum arthritis Mother   . Stroke  Mother   . Prostate cancer Father   . Heart disease Father   . Diabetes Brother   . Dementia Brother   . Parkinsonism Brother   . Kidney disease Son   . Diabetes Brother   . Cancer Brother   . Dementia Brother   . Colon cancer Neg Hx   . Esophageal cancer Neg Hx   . Pancreatic cancer Neg Hx   . Liver disease Neg Hx     SOCIAL HISTORY: Social History   Socioeconomic History  . Marital status: Divorced    Spouse name: Not on file  . Number of children: 3  . Years of education: 19  . Highest education level: Not on file  Social Needs  . Financial resource strain: Not on file  . Food insecurity - worry: Not on file  . Food insecurity - inability: Not on file  . Transportation needs - medical: Not on file  . Transportation needs - non-medical: Not on file  Occupational History  . Occupation: Retired    Fish farm manager: RETIRED  Tobacco Use  . Smoking status: Former Smoker    Years: 15.00    Types: Cigarettes    Last attempt to quit: 05/27/2017    Years since quitting: 0.3  . Smokeless tobacco: Never Used  . Tobacco comment: 03/16/17  1 pk/week  Substance and Sexual Activity  . Alcohol use: No    Alcohol/week: 0.0 oz  . Drug use: No   . Sexual activity: Not on file  Other Topics Concern  . Not on file  Social History Narrative   Single, dgtr lives with her   Never Smoked    Alcohol use- no   Drug use-no   Regular Exercise-yes   Former Smoker- 12/2008      PHYSICAL EXAM  Vitals:   09/19/17 0855  BP: (!) 146/94  Pulse: (!) 56  Weight: 218 lb 12.8 oz (99.2 kg)  Height: 5\' 6"  (1.676 m)   Body mass index is 35.32 kg/m.  Generalized: Well developed, in no acute distress   Neurological examination  Mentation: Alert oriented to time, place, history taking. Follows all commands speech and language fluent Cranial nerve II-XII: Pupils were equal round reactive to light. Extraocular movements were full, visual field were full on confrontational test. Facial sensation and strength were normal. Uvula tongue midline. Head turning and shoulder shrug  were normal and symmetric. Motor: The motor testing reveals 5 over 5 strength of all 4 extremities. Good symmetric motor tone is noted throughout.  Sensory: Sensory testing is intact to soft touch on all 4 extremities. No evidence of extinction is noted.  Coordination: Cerebellar testing reveals good finger-nose-finger and heel-to-shin bilaterally.  Gait and station: Gait is normal. Tandem gait  is slightly unsteady.  Romberg is negative. No drift is seen.  Reflexes: Deep tendon reflexes are symmetric and normal bilaterally.   DIAGNOSTIC DATA (LABS, IMAGING, TESTING) - I reviewed patient records, labs, notes, testing and imaging myself where available.  Lab Results  Component Value Date   WBC 12.5 (H) 05/29/2017   HGB 12.5 05/29/2017   HCT 37.2 05/29/2017   MCV 87.5 05/29/2017   PLT 228 05/29/2017      Component Value Date/Time   NA 140 09/08/2017 1018   K 4.1 09/08/2017 1018   CL 105 09/08/2017 1018   CO2 28 09/08/2017 1018   GLUCOSE 90 09/08/2017 1018   BUN 18 09/08/2017 1018   CREATININE 1.16 09/08/2017 1018   CREATININE 1.18 (H)  10/01/2016 1622   CALCIUM  9.6 09/08/2017 1018   PROT 7.8 05/25/2017 0602   PROT 7.3 12/01/2016 0906   ALBUMIN 3.7 05/25/2017 0602   AST 25 05/25/2017 0602   ALT 17 05/25/2017 0602   ALKPHOS 76 05/25/2017 0602   BILITOT 0.5 05/25/2017 0602   GFRNONAA 39 (L) 05/29/2017 1005   GFRAA 45 (L) 05/29/2017 1005   Lab Results  Component Value Date   CHOL 148 09/08/2017   HDL 59.90 09/08/2017   LDLCALC 72 09/08/2017   LDLDIRECT 171.5 12/06/2011   TRIG 83.0 09/08/2017   CHOLHDL 2 09/08/2017   Lab Results  Component Value Date   HGBA1C 5.9 (H) 05/25/2017   Lab Results  Component Value Date   VITAMINB12 555 12/01/2016   Lab Results  Component Value Date   TSH 2.360 12/01/2016      ASSESSMENT AND PLAN 72 y.o. year old female  has a past medical history of Allergy, Arthritis, C. difficile diarrhea, CAD (coronary artery disease), native coronary artery, CKD (chronic kidney disease) stage 3, GFR 30-59 ml/min ([place]) (10/11/2016), Colon polyps (2008), Costochondritis, Diabetes mellitus without complication ([place]), Gastritis, GERD (gastroesophageal reflux disease), History of echocardiogram, History of nuclear stress test, History of pneumonia, cardiovascular stress test, Hypertension, Neuropathy, Orthostatic hypotension (05/28/2017), Osteoarthritis, S/P angioplasty with stent 05/27/17 to LCX with DES  (05/28/2017), and Thyroid disease. here with:  1.  Axonal neuropathy 2.  Carpal tunnel syndrome on the left  Overall the patient is doing well.  She will continue on gabapentin 300 mg at bedtime.  I advised that if her symptoms worsen we could increase gabapentin to 400 mg at bedtime.  She voiced understanding.  She will continue with compounded cream.  If her symptoms worsen or she develops new symptoms she will let us know.  Follow-up in 6 months or sooner if needed.      Ward Givens, MSN, NP-C 09/19/2017, 8:51 AM [facility] [address] [place], [place] [address] [phone]

## 2017-09-21 ENCOUNTER — Encounter (HOSPITAL_COMMUNITY): Payer: Self-pay

## 2017-09-23 ENCOUNTER — Other Ambulatory Visit: Payer: Self-pay | Admitting: Cardiology

## 2017-09-23 ENCOUNTER — Encounter (HOSPITAL_COMMUNITY): Payer: Self-pay

## 2017-09-26 ENCOUNTER — Encounter (HOSPITAL_COMMUNITY): Payer: Self-pay

## 2017-09-28 ENCOUNTER — Encounter (HOSPITAL_COMMUNITY): Payer: Self-pay

## 2017-09-30 ENCOUNTER — Encounter (HOSPITAL_COMMUNITY): Payer: Self-pay | Attending: Internal Medicine

## 2017-09-30 DIAGNOSIS — Z955 Presence of coronary angioplasty implant and graft: Secondary | ICD-10-CM | POA: Insufficient documentation

## 2017-10-03 ENCOUNTER — Telehealth: Payer: Self-pay | Admitting: Adult Health

## 2017-10-03 ENCOUNTER — Encounter (HOSPITAL_COMMUNITY): Payer: Self-pay

## 2017-10-03 NOTE — Telephone Encounter (Signed)
Pt is stating that her compounded neuropathy cream has went up to $75 and is wanting to know if something cheaper can be called in. Please call to advise

## 2017-10-04 ENCOUNTER — Encounter: Payer: Self-pay | Admitting: Physical Therapy

## 2017-10-04 ENCOUNTER — Other Ambulatory Visit: Payer: Self-pay

## 2017-10-04 ENCOUNTER — Ambulatory Visit: Payer: Medicare HMO | Attending: Family Medicine | Admitting: Physical Therapy

## 2017-10-04 ENCOUNTER — Other Ambulatory Visit: Payer: Self-pay | Admitting: *Deleted

## 2017-10-04 DIAGNOSIS — M62838 Other muscle spasm: Secondary | ICD-10-CM | POA: Diagnosis not present

## 2017-10-04 DIAGNOSIS — R293 Abnormal posture: Secondary | ICD-10-CM | POA: Insufficient documentation

## 2017-10-04 DIAGNOSIS — M542 Cervicalgia: Secondary | ICD-10-CM

## 2017-10-04 MED ORDER — TIZANIDINE HCL 2 MG PO CAPS: 2.0000 mg | ORAL_CAPSULE | Freq: Three times a day (TID) | ORAL | 1 refills | Status: DC

## 2017-10-04 NOTE — Telephone Encounter (Signed)
Yes send to [facility] and we will see if its cheaper.

## 2017-10-04 NOTE — Therapy (Signed)
[place], [place], [address] Phone: [phone]   Fax:  [phone]  Physical Therapy Evaluation  Patient Details  Name: [name] MRN: [id number] Date of Birth: 01/03/1946 Referring Provider: [name], [name], MD   Encounter Date: 10/04/2017  PT End of Session - 10/04/17 1015    Visit Number  1    Number of Visits  13    Date for PT Re-Evaluation  11/15/17    Authorization Type  MCR    PT Start Time  1015    PT Stop Time  1058    PT Time Calculation (min)  43 min    Activity Tolerance  Patient tolerated treatment well    Behavior During Therapy  [facility] for tasks assessed/performed       Past Medical History:  Diagnosis Date  . Allergy   . Arthritis    RA (Dr. Ouida Sills)  . C. difficile diarrhea    history of  . CAD (coronary artery disease), native coronary artery    a. Cardiac CT with + FFR for Ramus b. cath 05/2017 100% sub acute lesion to mid circumflex artery s/p DES.  . CKD (chronic kidney disease) stage 3, GFR 30-59 ml/min ([place]) 10/11/2016   s/p R nephrectomy  . Colon polyps 2008   HYPERPLASTIC  . Costochondritis    a. Nuc Stress Test 6/16: EF 70, no scar or ischemia, low risk // b. Echo 3/16: mild conc LVH, EF 65-70, no RWMA, Gr 1 DD, mild TR  . Diabetes mellitus without complication ([place])    type 2  . Gastritis   . GERD (gastroesophageal reflux disease)   . History of echocardiogram    Echo 3/18:  Moderate LVH, EF 78-93, grade 1 diastolic dysfunction, calcified aortic valve, mild MR, moderate LAE  . History of nuclear stress test    Myoview 3/18: Mod size and intensity fixed septal defect, may be artifact.Opposite mod size and intensity lat defect, which is reversible and could represent ischemia or possibly artifact (SDS 4). LVEF 71% with normal wall motion. Intermediate risk study. >> images reviewed with Dr. Dorris Carnes - no sig ischemia; med rx   . History of pneumonia   . Hx of  cardiovascular stress test    Lexiscan Myoview 6/16:  EF 70%, no scar or ischemia; Low Risk  . Hypertension   . Neuropathy   . Orthostatic hypotension 05/28/2017  . Osteoarthritis   . S/P angioplasty with stent 05/27/17 to LCX with DES  05/28/2017  . Thyroid disease     Past Surgical History:  Procedure Laterality Date  . ABDOMINAL HYSTERECTOMY  1978  . BACK SURGERY     x 2  . CARPAL TUNNEL RELEASE Left   . COLONOSCOPY W/ BIOPSIES AND POLYPECTOMY     Hx: of  . CORONARY STENT INTERVENTION N/A 05/27/2017   Procedure: CORONARY STENT INTERVENTION;  Surgeon: Burnell Blanks, MD;  Location: [place] [facility];  Service: Cardiovascular;  Laterality: N/A;  . HNP    . LEFT HEART CATH AND CORONARY ANGIOGRAPHY N/A 05/27/2017   Procedure: LEFT HEART CATH AND CORONARY ANGIOGRAPHY;  Surgeon: Burnell Blanks, MD;  Location: [place] [facility];  Service: Cardiovascular;  Laterality: N/A;  . LUMBAR LAMINECTOMY/DECOMPRESSION MICRODISCECTOMY Left 02/12/2013   Procedure: LUMBAR TWO THREE, LUMBAR THREE FOUR, LUMBAR FOUR FIVE  LAMINECTOMY/DECOMPRESSION MICRODISCECTOMY 3 LEVELS;  Surgeon: Charlie Pitter, MD;  Location: [facility];  Service: Neurosurgery;  Laterality: Left;  .  NEPHRECTOMY Right 2010   10.rcc cancer  . TOTAL KNEE ARTHROPLASTY Right    Redo    There were no vitals filed for this visit.   Subjective Assessment - 10/04/17 1013    Subjective  pt is a 72 y.o F with CC o hx of chronic intermittent neck pain that recent exacerbationg about 3 weeks ago with no specific MOI. fluctuates from R to L side of the neck. since onset she reports the pain seems to be improving. pain seems to stay in the back of the head and reports no referral.    Pertinent History  Pmhx cx    How long can you sit comfortably?  unlimited    How long can you stand comfortably?  unilmited    How long can you walk comfortably?  unlimited    Diagnostic tests  x-ray of the neck at [facility]    Patient Stated  Goals  feel better, return to cardio rehab    Currently in Pain?  Yes    Pain Score  5  at worst 10/10,     Pain Location  Neck    Pain Orientation  Right;Left    Pain Descriptors / Indicators  Sharp;Dull    Pain Type  -- sub-acute going on chronic    Pain Onset  1 to 4 weeks ago    Pain Frequency  Intermittent    Aggravating Factors   turning the head to the L, looking down/ up    Pain Relieving Factors  medication, heat, and ice         [facility] PT Assessment - 10/04/17 1014      Assessment   Medical Diagnosis  neck pain    Referring Provider  [name], [name], MD    Onset Date/Surgical Date  -- 3 weeks ago    Hand Dominance  Right    Next MD Visit  10/18/2017    Prior Therapy  yes low back      Precautions   Precautions  None      Restrictions   Weight Bearing Restrictions  No      Balance Screen   Has the patient fallen in the past 6 months  No    Has the patient had a decrease in activity level because of a fear of falling?   No    Is the patient reluctant to leave their home because of a fear of falling?   No      Home Environment   Living Environment  Private residence    Living Arrangements  Children    Available Help at Discharge  Family;Available PRN/intermittently    Type of Home  House    Home Access  Stairs to enter    Entrance Stairs-Number of Steps  2    Entrance Stairs-Rails  Right    Home Layout  One level    Greasy - 2 wheels;Kasandra Knudsen - single point      Prior Function   Level of Independence  Independent    Vocation  Retired      Charity fundraiser Status  Within Functional Limits for tasks assessed      Observation/Other Assessments   Focus on Therapeutic Outcomes (FOTO)   41% limited  predicted 32% limited      Posture/Postural Control   Posture/Postural Control  Postural limitations    Postural Limitations  Rounded Shoulders;Forward head      ROM / Strength  AROM / PROM / Strength  AROM;Strength      AROM   AROM  Assessment Site  Cervical    Cervical Flexion  50    Cervical Extension  23    Cervical - Right Side Bend  20 ERP noted on the L    Cervical - Left Side Bend  40    Cervical - Right Rotation  39 ERP noted on the R    Cervical - Left Rotation  42      Palpation   Patella mobility  C3-C7 hypomobility PAIVM    Palpation comment  TTP with multiple trigger points in bil upper trap/ levator scapulae, bil cervical parapsinals and sub-occipitals      Special Tests    Special Tests  Cervical    Cervical Tests  Dictraction;Spurling's;other      Spurling's   Findings  Negative      other    Comment  ULTT (Median nerve)             Objective measurements completed on examination: See above findings.      [facility] Adult PT Treatment/Exercise - 10/04/17 1014      Neck Exercises: Supine   Neck Retraction  10 reps;5 secs      Manual Therapy   Manual therapy comments  1st rib mobs grade 3 with pt breathing in and out      Neck Exercises: Stretches   Upper Trapezius Stretch  2 reps;30 seconds    Levator Stretch  2 reps;30 seconds             PT Education - 10/04/17 1018    Education provided  Yes    Education Details  evaluation findings, POC, goals, HEP with proper form/ rationale.     Person(s) Educated  Patient    Methods  Explanation;Verbal cues    Comprehension  Verbalized understanding;Verbal cues required       PT Short Term Goals - 10/04/17 1019      PT SHORT TERM GOAL #1   Title  pt to be I with inital HEP    Time  3    Period  Weeks    Status  New    Target Date  10/25/17      PT SHORT TERM GOAL #2   Title  pt to verbalize/ demo proper posture in multiple positions as well as proper lifting mechanics to prevent/ reduce neck pain3    Time  3    Period  Weeks    Status  New    Target Date  10/25/17        PT Long Term Goals - 10/04/17 1124      PT LONG TERM GOAL #1   Title  increase cervical extension to >/= 40 degreees, increase bil side  bending / rotation by >/=10 degrees with </= 2/10 pain to promote safety with driving    Time  6    Period  Weeks    Status  New    Target Date  11/15/17      PT LONG TERM GOAL #2   Title  pt to report reduce s bil upper trap spasm to reduce pain </= 2/10 and promote cervical mobility     Time  6    Period  Weeks    Status  Achieved    Target Date  11/15/17      PT LONG TERM GOAL #3   Title  Increase FOTO score to </=  Time  6    Period  Weeks    Status  New    Target Date  11/15/17      PT LONG TERM GOAL #4   Title  pt to be I with all HEP given as of last visit to maintain and progress current level of function     Time  6    Period  Weeks    Status  New    Target Date  11/15/17             Plan - 10/04/17 1059    Clinical Impression Statement  pt presents to OPPT with CC of chronic neck pain with recent exacerbation 3 weeks ago. She demonstrated limited neck ROM in all planes due to tightness and soreness. TTP along bil upper trap/s levator scap, cervical parapsinals and sub-occipitals. She would benefit from physical therapy to decrease neck pain, reduce spasm, improve neck ROM and retunr to PLOF by addressing the deficits listed.     Clinical Decision Making  Low due to pt reporting symptoms are improving    Rehab Potential  Good    PT Frequency  2x / week    PT Duration  6 weeks    PT Treatment/Interventions  ADLs/Self Care Home Management;Cryotherapy;Traction;Dry needling;Passive range of motion;Manual techniques;Therapeutic exercise;Therapeutic activities;Taping;Neuromuscular re-education    PT Next Visit Plan  (no heat or E-stim, hx of cx), review/ update HEP, posture education, manual for upper trap/ levator scapulae, cervical mobs, scapular strengthening for posture    PT Home Exercise Plan  upper trap/ levator scapulae stretching, chin tucks ( in supine), 1st rib mobs    Consulted and Agree with Plan of Care  Patient       Patient will benefit from  skilled therapeutic intervention in order to improve the following deficits and impairments:  Pain, Increased fascial restricitons, Decreased activity tolerance, Decreased endurance, Improper body mechanics, Postural dysfunction, Decreased range of motion  Visit Diagnosis: Cervicalgia  Other muscle spasm  Abnormal posture     Problem List Patient Active Problem List   Diagnosis Date Noted  . Cervicalgia 09/16/2017  . Orthostatic hypotension 05/28/2017  . S/P angioplasty with stent 05/27/17 to LCX with DES  05/28/2017  . Coronary artery disease involving native coronary artery of native heart with unstable angina pectoris ([place])   . Polyneuropathy 05/25/2017  . Chest pain with moderate risk for cardiac etiology 05/25/2017  . Class 1 obesity with serious comorbidity and body mass index (BMI) of 34.0 to 34.9 in adult 12/06/2016  . CKD (chronic kidney disease) stage 3, GFR 30-59 ml/min ([facility]) 10/11/2016  . Hot flashes, menopausal 10/01/2016  . Chest pain with moderate risk of acute coronary syndrome 10/25/2014  . Degenerative spondylolisthesis 10/15/2014  . Spinal stenosis, lumbar region, with neurogenic claudication 02/12/2013  . Non-insulin dependent type 2 diabetes mellitus -diet 03/05/2010  . History of Rtr nephrectomy 2010, secondary to renal cell cancer 01/14/2009  . PERSONAL HX COLONIC POLYPS 11/14/2008  . Hyperlipidemia associated with type 2 diabetes mellitus ([place]) 11/16/2007  . ESOPHAGEAL STRICTURE 10/12/2007  . Rheumatoid arthritis ([place]) 02/27/2007  . Essential hypertension 01/19/2007  . Allergic rhinitis 01/19/2007  . GERD 01/19/2007   Starr Lake PT, DPT, LAT, ATC  10/04/17  11:30 AM      [facility] [address] [place], [place], [address] Phone: [phone]   Fax:  [phone]  Name: [name] MRN: [id number] Date of Birth: Nov 27, 1945

## 2017-10-05 ENCOUNTER — Encounter (HOSPITAL_COMMUNITY): Payer: Self-pay

## 2017-10-05 NOTE — Telephone Encounter (Signed)
Spoke with patient and informed her that a new Rx for neuropathy cream was faxed to [facility] today. She will get a call in a few days. She verbalized understanding, appreciation.

## 2017-10-07 ENCOUNTER — Encounter (HOSPITAL_COMMUNITY): Payer: Self-pay

## 2017-10-10 ENCOUNTER — Encounter (HOSPITAL_COMMUNITY): Payer: Self-pay

## 2017-10-11 ENCOUNTER — Ambulatory Visit: Payer: Medicare HMO | Admitting: Physical Therapy

## 2017-10-11 ENCOUNTER — Encounter: Payer: Self-pay | Admitting: Family Medicine

## 2017-10-11 ENCOUNTER — Ambulatory Visit (INDEPENDENT_AMBULATORY_CARE_PROVIDER_SITE_OTHER): Payer: Medicare HMO | Admitting: Family Medicine

## 2017-10-11 VITALS — BP 126/84 | HR 88 | Temp 98.9°F | Resp 12 | Ht 66.0 in | Wt 215.2 lb

## 2017-10-11 DIAGNOSIS — R0789 Other chest pain: Secondary | ICD-10-CM | POA: Diagnosis not present

## 2017-10-11 DIAGNOSIS — J069 Acute upper respiratory infection, unspecified: Secondary | ICD-10-CM | POA: Diagnosis not present

## 2017-10-11 DIAGNOSIS — J3089 Other allergic rhinitis: Secondary | ICD-10-CM | POA: Diagnosis not present

## 2017-10-11 LAB — POC INFLUENZA A&B (BINAX/QUICKVUE)
Influenza A, POC: NEGATIVE
Influenza B, POC: NEGATIVE

## 2017-10-11 MED ORDER — BENZONATATE 100 MG PO CAPS: 200.0000 mg | ORAL_CAPSULE | Freq: Two times a day (BID) | ORAL | 0 refills | Status: AC | PRN

## 2017-10-11 MED ORDER — IPRATROPIUM BROMIDE 0.06 % NA SOLN: 2.0000 | Freq: Four times a day (QID) | NASAL | 2 refills | Status: DC

## 2017-10-11 NOTE — Assessment & Plan Note (Signed)
This problem could be aggravating symptoms. She will continue Flonase nasal spray, we will add Atrovent nasal spray and nasal irrigation with saline as needed. Follow-up as needed.

## 2017-10-11 NOTE — Progress Notes (Signed)
ACUTE VISIT  HPI:  Chief Complaint  Patient presents with  . Cough    sx started Saturday  . Nasal Congestion  . Headache    [name] is a 72 y.o.female here today complaining of 3 days of respiratory symptoms.  Symptoms started Saturday,frontal pressure headache,nasal congestion,and rhinorrhea. Chest wall pain exacerbated by coughing and deep breathing. Hx of CAD, she follows with cardiologist and undergoing cardiac rehab.  Fever 101 F x 2 days.  She denies chest pain with exercise.  URI   This is a new problem. The current episode started in the past 7 days. The maximum temperature recorded prior to her arrival was 101 - 101.9 F. The fever has been present for 1 to 2 days. Associated symptoms include congestion, coughing, headaches (frontal), neck pain (chronic), a plugged ear sensation, rhinorrhea and a sore throat. Pertinent negatives include no abdominal pain, diarrhea, dysuria, ear pain, nausea, rash, swollen glands, vomiting or wheezing. She has tried decongestant and acetaminophen for the symptoms. The treatment provided mild relief.     No Hx of recent travel. + Sick contact, daughter was sick. No known insect bite.  Hx of allergies: Yes. Hx of allergic rhinitis,she is on Flonase nasal spray.   OTC medications for this problem: Mucinex,Claritin,and Sudafed.  Symptoms otherwise stable.    Review of Systems  Constitutional: Positive for appetite change, chills, fatigue and fever. Negative for activity change.  HENT: Positive for congestion, postnasal drip, rhinorrhea, sinus pressure and sore throat. Negative for ear pain, facial swelling, mouth sores and voice change.   Eyes: Negative for discharge and redness.  Respiratory: Positive for cough. Negative for shortness of breath and wheezing.   Cardiovascular: Negative for palpitations and leg swelling.  Gastrointestinal: Negative for abdominal pain, diarrhea, nausea and vomiting.  Genitourinary:  Negative for dysuria and hematuria.  Musculoskeletal: Positive for myalgias and neck pain (chronic).  Skin: Negative for rash.  Allergic/Immunologic: Positive for environmental allergies.  Neurological: Positive for headaches (frontal). Negative for syncope and weakness.  Hematological: Negative for adenopathy. Does not bruise/bleed easily.  Psychiatric/Behavioral: Negative for confusion. The patient is nervous/anxious.       Current Outpatient Medications on File Prior to Visit  Medication Sig Dispense Refill  . acetaminophen (TYLENOL) 500 MG tablet Take 500 mg by mouth 2 (two) times daily as needed (pain).    [name] Kitchen aspirin 81 MG chewable tablet Chew 1 tablet (81 mg total) by mouth daily.    . fluticasone (FLONASE) 50 MCG/ACT nasal spray Place 1 spray into both nostrils 2 (two) times daily as needed for allergies. 32 g 7  . metoprolol tartrate (LOPRESSOR) 25 MG tablet TAKE 1/2 TABLET BY MOUTH 2 TIMES A DAY 30 tablet 11  . Multiple Vitamin (MULTIVITAMIN) tablet Take 1 tablet by mouth daily.    . nitroGLYCERIN (NITROSTAT) 0.4 MG SL tablet Place 1 tablet (0.4 mg total) under the tongue every 5 (five) minutes as needed for chest pain. 30 tablet 6  . NONFORMULARY OR COMPOUNDED ITEM Rx for compounded cream faxed to [facility]    . polyethylene glycol powder (GLYCOLAX/MIRALAX) powder Take 17 g by mouth daily. 500 g 3  . ranitidine (ZANTAC) 150 MG tablet TAKE 1 TABLET BY MOUTH TWICE A DAY 180 tablet 2  . rosuvastatin (CRESTOR) 20 MG tablet Take 1 tablet (20 mg total) by mouth daily at 6 PM. 90 tablet 3  . ticagrelor (BRILINTA) 90 MG TABS tablet Take 1 tablet (90  mg total) by mouth 2 (two) times daily. 60 tablet 10  . tizanidine (ZANAFLEX) 2 MG capsule Take 1 capsule (2 mg total) by mouth 3 (three) times daily. 135 capsule 1  . gabapentin (NEURONTIN) 400 MG capsule Take 1 capsule (400 mg total) by mouth at bedtime. 30 capsule 5   No current facility-administered medications on file prior to visit.       Past Medical History:  Diagnosis Date  . Allergy   . Arthritis    RA (Dr. Ouida Sills)  . C. difficile diarrhea    history of  . CAD (coronary artery disease), native coronary artery    a. Cardiac CT with + FFR for Ramus b. cath 05/2017 100% sub acute lesion to mid circumflex artery s/p DES.  . CKD (chronic kidney disease) stage 3, GFR 30-59 ml/min ([place]) 10/11/2016   s/p R nephrectomy  . Colon polyps 2008   HYPERPLASTIC  . Costochondritis    a. Nuc Stress Test 6/16: EF 70, no scar or ischemia, low risk // b. Echo 3/16: mild conc LVH, EF 65-70, no RWMA, Gr 1 DD, mild TR  . Diabetes mellitus without complication ([place])    type 2  . Gastritis   . GERD (gastroesophageal reflux disease)   . History of echocardiogram    Echo 3/18:  Moderate LVH, EF 44-31, grade 1 diastolic dysfunction, calcified aortic valve, mild MR, moderate LAE  . History of nuclear stress test    Myoview 3/18: Mod size and intensity fixed septal defect, may be artifact.Opposite mod size and intensity lat defect, which is reversible and could represent ischemia or possibly artifact (SDS 4). LVEF 71% with normal wall motion. Intermediate risk study. >> images reviewed with Dr. Dorris Carnes - no sig ischemia; med rx   . History of pneumonia   . Hx of cardiovascular stress test    Lexiscan Myoview 6/16:  EF 70%, no scar or ischemia; Low Risk  . Hypertension   . Neuropathy   . Orthostatic hypotension 05/28/2017  . Osteoarthritis   . S/P angioplasty with stent 05/27/17 to LCX with DES  05/28/2017  . Thyroid disease    Allergies  Allergen Reactions  . Percocet [Oxycodone-Acetaminophen] Hives, Itching and Other (See Comments)    hallucinations  . Pravachol [Pravastatin Sodium] Other (See Comments)    Muscle aches  . Hydrocodone Other (See Comments)    "crazy dreams"  . Aspirin Other (See Comments)    REACTION: GI upset    Social History   Socioeconomic History  . Marital status: Divorced    Spouse name: None    . Number of children: 3  . Years of education: 53  . Highest education level: None  Social Needs  . Financial resource strain: None  . Food insecurity - worry: None  . Food insecurity - inability: None  . Transportation needs - medical: None  . Transportation needs - non-medical: None  Occupational History  . Occupation: Retired    Fish farm manager: RETIRED  Tobacco Use  . Smoking status: Former Smoker    Years: 15.00    Types: Cigarettes    Last attempt to quit: 05/27/2017    Years since quitting: 0.3  . Smokeless tobacco: Never Used  . Tobacco comment: 03/16/17  1 pk/week  Substance and Sexual Activity  . Alcohol use: No    Alcohol/week: 0.0 oz  . Drug use: No  . Sexual activity: None  Other Topics Concern  . None  Social History Narrative  Single, dgtr lives with her   Never Smoked    Alcohol use- no   Drug use-no   Regular Exercise-yes   Former Smoker- 12/2008    Vitals:   10/11/17 1103  BP: 126/84  Pulse: 88  Resp: 12  Temp: 98.9 F (37.2 C)  SpO2: 97%   Body mass index is 34.74 kg/m.   Physical Exam  Nursing note and vitals reviewed. Constitutional: She is oriented to person, place, and time. She appears well-developed. She does not appear ill. No distress.  HENT:  Head: Normocephalic and atraumatic.  Right Ear: Tympanic membrane, external ear and ear canal normal.  Left Ear: External ear and ear canal normal. Tympanic membrane is not erythematous and not retracted. A middle ear effusion is present.  Nose: Rhinorrhea present. Right sinus exhibits no maxillary sinus tenderness and no frontal sinus tenderness. Left sinus exhibits no maxillary sinus tenderness and no frontal sinus tenderness.  Mouth/Throat: Uvula is midline and mucous membranes are normal. Posterior oropharyngeal erythema (mild) present. No oropharyngeal exudate or posterior oropharyngeal edema.  Hypertrophic turbinates. Nasal voice. Postnasal drainage.  Eyes: Conjunctivae are normal. Pupils  are equal, round, and reactive to light.  Neck: No muscular tenderness present. No edema and no erythema present.  Cardiovascular: Normal rate and regular rhythm.  No murmur heard. Respiratory: Effort normal and breath sounds normal. No stridor. No respiratory distress.  Lymphadenopathy:       Head (right side): No submandibular adenopathy present.       Head (left side): No submandibular adenopathy present.    She has no cervical adenopathy.  Neurological: She is alert and oriented to person, place, and time. She has normal strength. Gait normal.  Skin: Skin is warm. No rash noted. No erythema.  Psychiatric: Her mood appears anxious.  Well groomed, good eye contact.      ASSESSMENT AND PLAN:   [name] was seen today for cough, nasal congestion and headache.  Diagnoses and all orders for this visit:  URI, acute  Symptoms suggests a viral etiology, symptomatic treatment recommended. Instructed to monitor for signs of complications,clearly instructed about warning signs. I also explained that cough and nasal congestion can last a few days and sometimes weeks. Decongestants not recommended given her Hx of CAD. F/U as needed.   -     ipratropium (ATROVENT) 0.06 % nasal spray; Place 2 sprays into both nostrils 4 (four) times daily. -     benzonatate (TESSALON) 100 MG capsule; Take 2 capsules (200 mg total) by mouth 2 (two) times daily as needed for up to 10 days. -     POC Influenza A&B (Binax test)  Non-seasonal allergic rhinitis, unspecified trigger  Continue Flonase nasal spray. Atrovent nasal spray added. Nasal irrigations with saline.   Chest wall pain  Reassured. Musculoskeletal most likely. Instructed to breath deep a few times per day to prevent atelectasias. Instructed about warning signs.    -[name] advised to seek attention immediately if symptoms worsen or to follow if they persist or new concerns arise.       [name]. [name], MD  [facility]. [place] office.

## 2017-10-11 NOTE — Patient Instructions (Addendum)
A few things to remember from today's visit:   URI, acute - Plan: ipratropium (ATROVENT) 0.06 % nasal spray, benzonatate (TESSALON) 100 MG capsule, POC Influenza A&B (Binax test)  Non-seasonal allergic rhinitis, unspecified trigger - Plan: benzonatate (TESSALON) 100 MG capsule  Chest wall pain Avoid shallow breathing.  viral infections are self-limited and we treat each symptom depending of severity.  Over the counter medications as decongestants and cold medications usually help, they need to be taken with caution if there is a history of high blood pressure or palpitations. Caution because you have history of heart disease.  Tylenol also helps with most symptoms (headache, muscle aching, fever,etc) Plenty of fluids. Honey helps with cough. Steam inhalations helps with runny nose, nasal congestion, and may prevent sinus infections. Cough and nasal congestion could last a few days and sometimes weeks. Please follow in not any better in 1-2 weeks or if symptoms get worse.  Please be sure medication list is accurate. If a new problem present, please set up appointment sooner than planned today.

## 2017-10-12 ENCOUNTER — Encounter (HOSPITAL_COMMUNITY): Payer: Self-pay

## 2017-10-12 MED ORDER — GABAPENTIN 400 MG PO CAPS: 400.0000 mg | ORAL_CAPSULE | Freq: Every day | ORAL | 5 refills | Status: DC

## 2017-10-12 NOTE — Telephone Encounter (Signed)
LVM informing patient that NP sent in new Rx to increase her night time Gabapentin, sent to [facility] she take it for 21-3 weeks then call to let this office know how she is doping. Left number for any questions.

## 2017-10-12 NOTE — Telephone Encounter (Signed)
I will increase gabapentin to 400 mg at bedtime.  Order placed.  Please call patient

## 2017-10-12 NOTE — Telephone Encounter (Signed)
Pt said the cream will cost her $300. She has called her insurance, it does not cover compound creams. Pt said she needs something for the pain. Please call to advise

## 2017-10-12 NOTE — Addendum Note (Signed)
Addended by: Trudie Buckler on: 10/12/2017 03:29 PM   Modules accepted: Orders

## 2017-10-14 ENCOUNTER — Ambulatory Visit: Payer: Medicare HMO | Admitting: Physical Therapy

## 2017-10-14 ENCOUNTER — Other Ambulatory Visit: Payer: Self-pay | Admitting: *Deleted

## 2017-10-14 ENCOUNTER — Encounter (HOSPITAL_COMMUNITY): Payer: Self-pay

## 2017-10-14 DIAGNOSIS — M542 Cervicalgia: Secondary | ICD-10-CM

## 2017-10-14 MED ORDER — DULOXETINE HCL 30 MG PO CPEP: 30.0000 mg | ORAL_CAPSULE | Freq: Every day | ORAL | 1 refills | Status: DC

## 2017-10-17 ENCOUNTER — Encounter (HOSPITAL_COMMUNITY): Payer: Self-pay

## 2017-10-18 ENCOUNTER — Encounter: Payer: Self-pay | Admitting: Physical Therapy

## 2017-10-18 ENCOUNTER — Ambulatory Visit: Payer: Medicare HMO | Admitting: Physical Therapy

## 2017-10-18 ENCOUNTER — Ambulatory Visit: Payer: Medicare HMO | Admitting: Family Medicine

## 2017-10-18 DIAGNOSIS — R293 Abnormal posture: Secondary | ICD-10-CM

## 2017-10-18 DIAGNOSIS — M542 Cervicalgia: Secondary | ICD-10-CM

## 2017-10-18 DIAGNOSIS — M62838 Other muscle spasm: Secondary | ICD-10-CM | POA: Diagnosis not present

## 2017-10-18 NOTE — Therapy (Signed)
[place], [place], [address] Phone: [phone]   Fax:  [phone]  Physical Therapy Treatment  Patient Details  Name: [name] MRN: [id number] Date of Birth: 12-19-1945 Referring Provider: [name], [name], MD   Encounter Date: 10/18/2017  PT End of Session - 10/18/17 1309    Visit Number  2    Number of Visits  13    Date for PT Re-Evaluation  11/15/17    PT Start Time  1103    PT Stop Time  1146    PT Time Calculation (min)  43 min    Activity Tolerance  Patient tolerated treatment well    Behavior During Therapy  [facility] for tasks assessed/performed       Past Medical History:  Diagnosis Date  . Allergy   . Arthritis    RA (Dr. Ouida Sills)  . C. difficile diarrhea    history of  . CAD (coronary artery disease), native coronary artery    a. Cardiac CT with + FFR for Ramus b. cath 05/2017 100% sub acute lesion to mid circumflex artery s/p DES.  . CKD (chronic kidney disease) stage 3, GFR 30-59 ml/min ([place]) 10/11/2016   s/p R nephrectomy  . Colon polyps 2008   HYPERPLASTIC  . Costochondritis    a. Nuc Stress Test 6/16: EF 70, no scar or ischemia, low risk // b. Echo 3/16: mild conc LVH, EF 65-70, no RWMA, Gr 1 DD, mild TR  . Diabetes mellitus without complication ([place])    type 2  . Gastritis   . GERD (gastroesophageal reflux disease)   . History of echocardiogram    Echo 3/18:  Moderate LVH, EF 56-38, grade 1 diastolic dysfunction, calcified aortic valve, mild MR, moderate LAE  . History of nuclear stress test    Myoview 3/18: Mod size and intensity fixed septal defect, may be artifact.Opposite mod size and intensity lat defect, which is reversible and could represent ischemia or possibly artifact (SDS 4). LVEF 71% with normal wall motion. Intermediate risk study. >> images reviewed with Dr. Dorris Carnes - no sig ischemia; med rx   . History of pneumonia   . Hx of cardiovascular stress test    Lexiscan Myoview 6/16:  EF 70%, no scar or ischemia; Low Risk  . Hypertension   . Neuropathy   . Orthostatic hypotension 05/28/2017  . Osteoarthritis   . S/P angioplasty with stent 05/27/17 to LCX with DES  05/28/2017  . Thyroid disease     Past Surgical History:  Procedure Laterality Date  . ABDOMINAL HYSTERECTOMY  1978  . BACK SURGERY     x 2  . CARPAL TUNNEL RELEASE Left   . COLONOSCOPY W/ BIOPSIES AND POLYPECTOMY     Hx: of  . CORONARY STENT INTERVENTION N/A 05/27/2017   Procedure: CORONARY STENT INTERVENTION;  Surgeon: Burnell Blanks, MD;  Location: [facility];  Service: Cardiovascular;  Laterality: N/A;  . HNP    . LEFT HEART CATH AND CORONARY ANGIOGRAPHY N/A 05/27/2017   Procedure: LEFT HEART CATH AND CORONARY ANGIOGRAPHY;  Surgeon: Burnell Blanks, MD;  Location: [facility];  Service: Cardiovascular;  Laterality: N/A;  . LUMBAR LAMINECTOMY/DECOMPRESSION MICRODISCECTOMY Left 02/12/2013   Procedure: LUMBAR TWO THREE, LUMBAR THREE FOUR, LUMBAR FOUR FIVE  LAMINECTOMY/DECOMPRESSION MICRODISCECTOMY 3 LEVELS;  Surgeon: Charlie Pitter, MD;  Location: [facility];  Service: Neurosurgery;  Laterality: Left;  . NEPHRECTOMY Right 2010   10.rcc  cancer  . TOTAL KNEE ARTHROPLASTY Right    Redo    There were no vitals filed for this visit.  Subjective Assessment - 10/18/17 1106    Subjective    Missed last week due to being sick.    Currently in Pain?  Yes    Pain Location  Neck    Pain Orientation  Left;Right LT>  RT   no right today    Pain Descriptors / Indicators  -- I have neuropathy pain in left arm,     Pain Radiating Towards   to right elbow    Aggravating Factors   not sure    Pain Relieving Factors  heating pad                      [facility] Adult PT Treatment/Exercise - 10/18/17 0001      Neck Exercises: Seated   Other Seated Exercise  AROM in pain free ranges  with good posture      Neck Exercises: Supine   Neck Retraction   10 reps;5 secs    Other Supine Exercise  scapular retraction with fist squeeze ,,  Eyes overhead tongue to roof of mouth 10 X with head press and fist press with arms circles 10 X  with head press.        Manual Therapy   Manual therapy comments  soft tissue work left neck upper back.  Some pain relieves,  pain centralized.  instrument assit at times.  Cued to relax. cued  left      Neck Exercises: Stretches   Upper Trapezius Stretch  2 reps;30 seconds cues    Levator Stretch  2 reps;30 seconds    Other Neck Stretches  1 st rib mobs with sheet and cues             PT Education - 10/18/17 1309    Education provided  Yes    Education Details    Sitting posture    Person(s) Educated  Patient    Methods  Explanation;Demonstration    Comprehension  Verbalized understanding;Returned demonstration       PT Short Term Goals - 10/04/17 1019      PT SHORT TERM GOAL #1   Title  pt to be I with inital HEP    Time  3    Period  Weeks    Status  New    Target Date  10/25/17      PT SHORT TERM GOAL #2   Title  pt to verbalize/ demo proper posture in multiple positions as well as proper lifting mechanics to prevent/ reduce neck pain3    Time  3    Period  Weeks    Status  New    Target Date  10/25/17        PT Long Term Goals - 10/04/17 1124      PT LONG TERM GOAL #1   Title  increase cervical extension to >/= 40 degreees, increase bil side bending / rotation by >/=10 degrees with </= 2/10 pain to promote safety with driving    Time  6    Period  Weeks    Status  New    Target Date  11/15/17      PT LONG TERM GOAL #2   Title  pt to report reduce s bil upper trap spasm to reduce pain </= 2/10 and promote cervical mobility     Time  6  Period  Weeks    Status  Achieved    Target Date  11/15/17      PT LONG TERM GOAL #3   Title  Increase FOTO score to </=    Time  6    Period  Weeks    Status  New    Target Date  11/15/17      PT LONG TERM GOAL #4   Title  pt to  be I with all HEP given as of last visit to maintain and progress current level of function     Time  6    Period  Weeks    Status  New    Target Date  11/15/17            Plan - 10/18/17 1310    Clinical Impression Statement   Patienr missed last week due to being sick.  She did not yet start her HEP because she did not feel well.  Patient has not yet seen improvements since she has started PT.  She did have less pain at end of session  and her pain was centralized.      PT Next Visit Plan  (no heat or E-stim, hx of cx), review/ update HEP, posture education, manual for upper trap/ levator scapulae, cervical mobs, scapular strengthening for posture.  Cervical stabilization for HEP scapular ex too  difficult for now    PT Home Exercise Plan  upper trap/ levator scapulae stretching, chin tucks ( in supine), 1st rib mobs    Consulted and Agree with Plan of Care  Patient       Patient will benefit from skilled therapeutic intervention in order to improve the following deficits and impairments:     Visit Diagnosis: Cervicalgia  Other muscle spasm  Abnormal posture     Problem List Patient Active Problem List   Diagnosis Date Noted  . Cervicalgia 09/16/2017  . Orthostatic hypotension 05/28/2017  . S/P angioplasty with stent 05/27/17 to LCX with DES  05/28/2017  . Coronary artery disease involving native coronary artery of native heart with unstable angina pectoris ([place])   . Polyneuropathy 05/25/2017  . Chest pain with moderate risk for cardiac etiology 05/25/2017  . Class 1 obesity with serious comorbidity and body mass index (BMI) of 34.0 to 34.9 in adult 12/06/2016  . CKD (chronic kidney disease) stage 3, GFR 30-59 ml/min ([facility]) 10/11/2016  . Hot flashes, menopausal 10/01/2016  . Chest pain with moderate risk of acute coronary syndrome 10/25/2014  . Degenerative spondylolisthesis 10/15/2014  . Spinal stenosis, lumbar region, with neurogenic claudication 02/12/2013  .  Non-insulin dependent type 2 diabetes mellitus -diet 03/05/2010  . History of Rtr nephrectomy 2010, secondary to renal cell cancer 01/14/2009  . PERSONAL HX COLONIC POLYPS 11/14/2008  . Hyperlipidemia associated with type 2 diabetes mellitus ([place]) 11/16/2007  . ESOPHAGEAL STRICTURE 10/12/2007  . Rheumatoid arthritis ([place]) 02/27/2007  . Essential hypertension 01/19/2007  . Allergic rhinitis 01/19/2007  . GERD 01/19/2007    Conway Fedora PTA 10/18/2017, 1:15 PM  [facility] [address] [place], [place], [address] Phone: [phone]   Fax:  [phone]  Name: [name] MRN: [id number] Date of Birth: [birth year]

## 2017-10-19 ENCOUNTER — Encounter (HOSPITAL_COMMUNITY): Payer: Self-pay

## 2017-10-21 ENCOUNTER — Ambulatory Visit: Payer: Medicare HMO | Admitting: Physical Therapy

## 2017-10-21 ENCOUNTER — Encounter (HOSPITAL_COMMUNITY): Payer: Self-pay

## 2017-10-24 ENCOUNTER — Encounter (HOSPITAL_COMMUNITY): Payer: Self-pay

## 2017-10-25 ENCOUNTER — Encounter: Payer: Self-pay | Admitting: Physical Therapy

## 2017-10-25 ENCOUNTER — Ambulatory Visit: Payer: Medicare HMO | Admitting: Physical Therapy

## 2017-10-25 DIAGNOSIS — M542 Cervicalgia: Secondary | ICD-10-CM

## 2017-10-25 DIAGNOSIS — R293 Abnormal posture: Secondary | ICD-10-CM | POA: Diagnosis not present

## 2017-10-25 DIAGNOSIS — M62838 Other muscle spasm: Secondary | ICD-10-CM | POA: Diagnosis not present

## 2017-10-25 NOTE — Therapy (Signed)
[place], [place], [address] Phone: [phone]   Fax:  [phone]  Physical Therapy Treatment  Patient Details  Name: [name] MRN: [id number] Date of Birth: Dec 03, 1945 Referring Provider: [name], [name], MD   Encounter Date: 10/25/2017  PT End of Session - 10/25/17 1030    Visit Number  3    Number of Visits  13    Date for PT Re-Evaluation  11/15/17    PT Start Time  1030 pt arrived 15 minutes late today    PT Stop Time  1101    PT Time Calculation (min)  31 min    Activity Tolerance  Patient tolerated treatment well    Behavior During Therapy  [facility] for tasks assessed/performed       Past Medical History:  Diagnosis Date  . Allergy   . Arthritis    RA (Dr. Ouida Sills)  . C. difficile diarrhea    history of  . CAD (coronary artery disease), native coronary artery    a. Cardiac CT with + FFR for Ramus b. cath 05/2017 100% sub acute lesion to mid circumflex artery s/p DES.  . CKD (chronic kidney disease) stage 3, GFR 30-59 ml/min ([place]) 10/11/2016   s/p R nephrectomy  . Colon polyps 2008   HYPERPLASTIC  . Costochondritis    a. Nuc Stress Test 6/16: EF 70, no scar or ischemia, low risk // b. Echo 3/16: mild conc LVH, EF 65-70, no RWMA, Gr 1 DD, mild TR  . Diabetes mellitus without complication ([place])    type 2  . Gastritis   . GERD (gastroesophageal reflux disease)   . History of echocardiogram    Echo 3/18:  Moderate LVH, EF 97-02, grade 1 diastolic dysfunction, calcified aortic valve, mild MR, moderate LAE  . History of nuclear stress test    Myoview 3/18: Mod size and intensity fixed septal defect, may be artifact.Opposite mod size and intensity lat defect, which is reversible and could represent ischemia or possibly artifact (SDS 4). LVEF 71% with normal wall motion. Intermediate risk study. >> images reviewed with Dr. Dorris Carnes - no sig ischemia; med rx   . History of pneumonia   . Hx of  cardiovascular stress test    Lexiscan Myoview 6/16:  EF 70%, no scar or ischemia; Low Risk  . Hypertension   . Neuropathy   . Orthostatic hypotension 05/28/2017  . Osteoarthritis   . S/P angioplasty with stent 05/27/17 to LCX with DES  05/28/2017  . Thyroid disease     Past Surgical History:  Procedure Laterality Date  . ABDOMINAL HYSTERECTOMY  1978  . BACK SURGERY     x 2  . CARPAL TUNNEL RELEASE Left   . COLONOSCOPY W/ BIOPSIES AND POLYPECTOMY     Hx: of  . CORONARY STENT INTERVENTION N/A 05/27/2017   Procedure: CORONARY STENT INTERVENTION;  Surgeon: Burnell Blanks, MD;  Location: [place] [facility];  Service: Cardiovascular;  Laterality: N/A;  . HNP    . LEFT HEART CATH AND CORONARY ANGIOGRAPHY N/A 05/27/2017   Procedure: LEFT HEART CATH AND CORONARY ANGIOGRAPHY;  Surgeon: Burnell Blanks, MD;  Location: [place] [facility];  Service: Cardiovascular;  Laterality: N/A;  . LUMBAR LAMINECTOMY/DECOMPRESSION MICRODISCECTOMY Left 02/12/2013   Procedure: LUMBAR TWO THREE, LUMBAR THREE FOUR, LUMBAR FOUR FIVE  LAMINECTOMY/DECOMPRESSION MICRODISCECTOMY 3 LEVELS;  Surgeon: Charlie Pitter, MD;  Location: [place] [facility];  Service: Neurosurgery;  Laterality: Left;  .  NEPHRECTOMY Right 2010   10.rcc cancer  . TOTAL KNEE ARTHROPLASTY Right    Redo    There were no vitals filed for this visit.  Subjective Assessment - 10/25/17 1029    Subjective  "last week I had increased soreness for the rest of the week, last session did well"     Currently in Pain?  Yes    Pain Score  6     Pain Orientation  Right;Left L>R    Pain Descriptors / Indicators  -- difficulty to note    Pain Onset  1 to 4 weeks ago    Aggravating Factors   not sure    Pain Relieving Factors  heating pad                No data recorded       [facility] Adult PT Treatment/Exercise - 10/25/17 1043      Neck Exercises: Supine   Other Supine Exercise  scapular retraction and bil ER 2 x 10 red  theraband      Manual Therapy   Manual Therapy  Soft tissue mobilization;Joint mobilization;Taping    Manual therapy comments  sub-occipital release, and MTPR along upper trap / levator    Joint Mobilization  L first rib mobs grade 3 with pt focused on breathing in / out deeply    McConnell  L upper trap inhibition taping      Neck Exercises: Stretches   Upper Trapezius Stretch  2 reps;30 seconds PNF, contract/ relax with 10 sec contraction               PT Short Term Goals - 10/04/17 1019      PT SHORT TERM GOAL #1   Title  pt to be I with inital HEP    Time  3    Period  Weeks    Status  New    Target Date  10/25/17      PT SHORT TERM GOAL #2   Title  pt to verbalize/ demo proper posture in multiple positions as well as proper lifting mechanics to prevent/ reduce neck pain3    Time  3    Period  Weeks    Status  New    Target Date  10/25/17        PT Long Term Goals - 10/04/17 1124      PT LONG TERM GOAL #1   Title  increase cervical extension to >/= 40 degreees, increase bil side bending / rotation by >/=10 degrees with </= 2/10 pain to promote safety with driving    Time  6    Period  Weeks    Status  New    Target Date  11/15/17      PT LONG TERM GOAL #2   Title  pt to report reduce s bil upper trap spasm to reduce pain </= 2/10 and promote cervical mobility     Time  6    Period  Weeks    Status  Achieved    Target Date  11/15/17      PT LONG TERM GOAL #3   Title  Increase FOTO score to </=    Time  6    Period  Weeks    Status  New    Target Date  11/15/17      PT LONG TERM GOAL #4   Title  pt to be I with all HEP given as of last visit to maintain and progress  current level of function     Time  6    Period  Weeks    Status  New    Target Date  11/15/17            Plan - 10/25/17 1111    Clinical Impression Statement  pt arrived 15 minutes late today. due to pt arriving late focused more on manual techniques today to relieve  tension, applied tape to relieve upper trap activation which she reported relief of pain and tightness and reported pain dropped to 3-4/10.     PT Treatment/Interventions  ADLs/Self Care Home Management;Cryotherapy;Traction;Dry needling;Passive range of motion;Manual techniques;Therapeutic exercise;Therapeutic activities;Taping;Neuromuscular re-education    PT Next Visit Plan  (no heat or E-stim, hx of cx), review/ update HEP, posture education, manual for upper trap/ levator scapulae, cervical mobs, scapular strengthening for posture.  Cervical stabilization for HEP scapular     PT Home Exercise Plan  upper trap/ levator scapulae stretching, chin tucks ( in supine), 1st rib mobs    Consulted and Agree with Plan of Care  Patient       Patient will benefit from skilled therapeutic intervention in order to improve the following deficits and impairments:  Pain, Increased fascial restricitons, Decreased activity tolerance, Decreased endurance, Improper body mechanics, Postural dysfunction, Decreased range of motion  Visit Diagnosis: Cervicalgia  Other muscle spasm  Abnormal posture     Problem List Patient Active Problem List   Diagnosis Date Noted  . Cervicalgia 09/16/2017  . Orthostatic hypotension 05/28/2017  . S/P angioplasty with stent 05/27/17 to LCX with DES  05/28/2017  . Coronary artery disease involving native coronary artery of native heart with unstable angina pectoris ([place])   . Polyneuropathy 05/25/2017  . Chest pain with moderate risk for cardiac etiology 05/25/2017  . Class 1 obesity with serious comorbidity and body mass index (BMI) of 34.0 to 34.9 in adult 12/06/2016  . CKD (chronic kidney disease) stage 3, GFR 30-59 ml/min ([facility]) 10/11/2016  . Hot flashes, menopausal 10/01/2016  . Chest pain with moderate risk of acute coronary syndrome 10/25/2014  . Degenerative spondylolisthesis 10/15/2014  . Spinal stenosis, lumbar region, with neurogenic claudication 02/12/2013  .  Non-insulin dependent type 2 diabetes mellitus -diet 03/05/2010  . History of Rtr nephrectomy 2010, secondary to renal cell cancer 01/14/2009  . PERSONAL HX COLONIC POLYPS 11/14/2008  . Hyperlipidemia associated with type 2 diabetes mellitus ([place]) 11/16/2007  . ESOPHAGEAL STRICTURE 10/12/2007  . Rheumatoid arthritis ([place]) 02/27/2007  . Essential hypertension 01/19/2007  . Allergic rhinitis 01/19/2007  . GERD 01/19/2007   Starr Lake PT, DPT, LAT, ATC  10/25/17  11:15 AM      [facility] [address] [place], [place], [address] Phone: [phone]   Fax:  [phone]  Name: [name] MRN: [id number] Date of Birth: 29-Jun-1946

## 2017-10-26 ENCOUNTER — Encounter (HOSPITAL_COMMUNITY): Payer: Self-pay

## 2017-10-27 ENCOUNTER — Ambulatory Visit: Payer: Medicare HMO | Admitting: Physical Therapy

## 2017-10-27 ENCOUNTER — Encounter: Payer: Self-pay | Admitting: Physical Therapy

## 2017-10-27 DIAGNOSIS — M542 Cervicalgia: Secondary | ICD-10-CM

## 2017-10-27 DIAGNOSIS — R293 Abnormal posture: Secondary | ICD-10-CM | POA: Diagnosis not present

## 2017-10-27 DIAGNOSIS — M62838 Other muscle spasm: Secondary | ICD-10-CM | POA: Diagnosis not present

## 2017-10-27 NOTE — Therapy (Signed)
[place], [place], [address] Phone: [phone]   Fax:  [phone]  Physical Therapy Treatment  Patient Details  Name: [name] MRN: [id number] Date of Birth: [birth year] Referring Provider: [name], [name], MD   Encounter Date: 10/27/2017  PT End of Session - 10/27/17 1043    Visit Number  4    Number of Visits  13    Date for PT Re-Evaluation  11/15/17    PT Start Time  1017    PT Stop Time  1059    PT Time Calculation (min)  42 min    Activity Tolerance  Patient tolerated treatment well    Behavior During Therapy  [facility] for tasks assessed/performed       Past Medical History:  Diagnosis Date  . Allergy   . Arthritis    RA (Dr. Ouida Sills)  . C. difficile diarrhea    history of  . CAD (coronary artery disease), native coronary artery    a. Cardiac CT with + FFR for Ramus b. cath 05/2017 100% sub acute lesion to mid circumflex artery s/p DES.  . CKD (chronic kidney disease) stage 3, GFR 30-59 ml/min ([place]) 10/11/2016   s/p R nephrectomy  . Colon polyps 2008   HYPERPLASTIC  . Costochondritis    a. Nuc Stress Test 6/16: EF 70, no scar or ischemia, low risk // b. Echo 3/16: mild conc LVH, EF 65-70, no RWMA, Gr 1 DD, mild TR  . Diabetes mellitus without complication ([place])    type 2  . Gastritis   . GERD (gastroesophageal reflux disease)   . History of echocardiogram    Echo 3/18:  Moderate LVH, EF 69-62, grade 1 diastolic dysfunction, calcified aortic valve, mild MR, moderate LAE  . History of nuclear stress test    Myoview 3/18: Mod size and intensity fixed septal defect, may be artifact.Opposite mod size and intensity lat defect, which is reversible and could represent ischemia or possibly artifact (SDS 4). LVEF 71% with normal wall motion. Intermediate risk study. >> images reviewed with Dr. Dorris Carnes - no sig ischemia; med rx   . History of pneumonia   . Hx of cardiovascular stress test    Lexiscan Myoview 6/16:  EF 70%, no scar or ischemia; Low Risk  . Hypertension   . Neuropathy   . Orthostatic hypotension 05/28/2017  . Osteoarthritis   . S/P angioplasty with stent 05/27/17 to LCX with DES  05/28/2017  . Thyroid disease     Past Surgical History:  Procedure Laterality Date  . ABDOMINAL HYSTERECTOMY  1978  . BACK SURGERY     x 2  . CARPAL TUNNEL RELEASE Left   . COLONOSCOPY W/ BIOPSIES AND POLYPECTOMY     Hx: of  . CORONARY STENT INTERVENTION N/A 05/27/2017   Procedure: CORONARY STENT INTERVENTION;  Surgeon: Burnell Blanks, MD;  Location: [place] [facility];  Service: Cardiovascular;  Laterality: N/A;  . HNP    . LEFT HEART CATH AND CORONARY ANGIOGRAPHY N/A 05/27/2017   Procedure: LEFT HEART CATH AND CORONARY ANGIOGRAPHY;  Surgeon: Burnell Blanks, MD;  Location: [place] [facility];  Service: Cardiovascular;  Laterality: N/A;  . LUMBAR LAMINECTOMY/DECOMPRESSION MICRODISCECTOMY Left 02/12/2013   Procedure: LUMBAR TWO THREE, LUMBAR THREE FOUR, LUMBAR FOUR FIVE  LAMINECTOMY/DECOMPRESSION MICRODISCECTOMY 3 LEVELS;  Surgeon: Charlie Pitter, MD;  Location: [place] [facility];  Service: Neurosurgery;  Laterality: Left;  . NEPHRECTOMY Right 2010   10.rcc  cancer  . TOTAL KNEE ARTHROPLASTY Right    Redo    There were no vitals filed for this visit.  Subjective Assessment - 10/27/17 1018    Subjective  "the last session really helped with the tape and exercises but still 7/10 pain today.     Currently in Pain?  Yes    Pain Score  7     Pain Location  Neck    Pain Orientation  Right;Left                No data recorded       [facility] Adult PT Treatment/Exercise - 10/27/17 1036      Neck Exercises: Machines for Strengthening   UBE (Upper Arm Bike)  L1 x 4 min changing direction at 2 min      Neck Exercises: Supine   Other Supine Exercise  lower trap 2 x 10 with yellow and with elbows on bolster    Other Supine Exercise  scapular retraction and  bil ER 2 x 10 red theraband      Manual Therapy   Manual Therapy  Myofascial release    Manual therapy comments  skilled palpation and monitoring throughout TPDN    Joint Mobilization  T1-T7 grade 3 PA, L first rib grade 3 mobs    Myofascial Release  fascial stretching/ rolling with L upper trap      Neck Exercises: Stretches   Upper Trapezius Stretch  2 reps;30 seconds       Trigger Point Dry Needling - 10/27/17 1059    Consent Given?  Yes    Education Handout Provided  Yes    Muscles Treated Upper Body  Upper trapezius    Upper Trapezius Response  Palpable increased muscle length;Twitch reponse elicited L only           PT Education - 10/27/17 1042    Education provided  Yes    Education Details  muscle anatomy and referral patterns. what TPDN is, benefits, what to expect and after care.     Person(s) Educated  Patient    Methods  Explanation;Verbal cues    Comprehension  Verbalized understanding;Verbal cues required       PT Short Term Goals - 10/04/17 1019      PT SHORT TERM GOAL #1   Title  pt to be I with inital HEP    Time  3    Period  Weeks    Status  New    Target Date  10/25/17      PT SHORT TERM GOAL #2   Title  pt to verbalize/ demo proper posture in multiple positions as well as proper lifting mechanics to prevent/ reduce neck pain3    Time  3    Period  Weeks    Status  New    Target Date  10/25/17        PT Long Term Goals - 10/04/17 1124      PT LONG TERM GOAL #1   Title  increase cervical extension to >/= 40 degreees, increase bil side bending / rotation by >/=10 degrees with </= 2/10 pain to promote safety with driving    Time  6    Period  Weeks    Status  New    Target Date  11/15/17      PT LONG TERM GOAL #2   Title  pt to report reduce s bil upper trap spasm to reduce pain </= 2/10 and promote cervical  mobility     Time  6    Period  Weeks    Status  Achieved    Target Date  11/15/17      PT LONG TERM GOAL #3   Title   Increase FOTO score to </=    Time  6    Period  Weeks    Status  New    Target Date  11/15/17      PT LONG TERM GOAL #4   Title  pt to be I with all HEP given as of last visit to maintain and progress current level of function     Time  6    Period  Weeks    Status  New    Target Date  11/15/17            Plan - 10/27/17 1043    Clinical Impression Statement  pt reported that the pain is still a 7/10 but that the inhibition taping really helped. educated on TPDN and performed on the l upper trap followed with IASTM over the L upper trap. continues stretching and strengthening which she performed well with report of only sorness from DN.     PT Treatment/Interventions  ADLs/Self Care Home Management;Cryotherapy;Traction;Dry needling;Passive range of motion;Manual techniques;Therapeutic exercise;Therapeutic activities;Taping;Neuromuscular re-education    PT Next Visit Plan  (no heat or E-stim, hx of cx) how was DN, update HEP, posture education, manual for upper trap/ levator scapulae, cervical mobs, scapular strengthening for posture.  Cervical stabilization for HEP scapular     PT Home Exercise Plan  upper trap/ levator scapulae stretching, chin tucks ( in supine), 1st rib mobs    Consulted and Agree with Plan of Care  Patient       Patient will benefit from skilled therapeutic intervention in order to improve the following deficits and impairments:  Pain, Increased fascial restricitons, Decreased activity tolerance, Decreased endurance, Improper body mechanics, Postural dysfunction, Decreased range of motion  Visit Diagnosis: Cervicalgia  Other muscle spasm  Abnormal posture     Problem List Patient Active Problem List   Diagnosis Date Noted  . Cervicalgia 09/16/2017  . Orthostatic hypotension 05/28/2017  . S/P angioplasty with stent 05/27/17 to LCX with DES  05/28/2017  . Coronary artery disease involving native coronary artery of native heart with unstable angina  pectoris ([place])   . Polyneuropathy 05/25/2017  . Chest pain with moderate risk for cardiac etiology 05/25/2017  . Class 1 obesity with serious comorbidity and body mass index (BMI) of 34.0 to 34.9 in adult 12/06/2016  . CKD (chronic kidney disease) stage 3, GFR 30-59 ml/min ([facility]) 10/11/2016  . Hot flashes, menopausal 10/01/2016  . Chest pain with moderate risk of acute coronary syndrome 10/25/2014  . Degenerative spondylolisthesis 10/15/2014  . Spinal stenosis, lumbar region, with neurogenic claudication 02/12/2013  . Non-insulin dependent type 2 diabetes mellitus -diet 03/05/2010  . History of Rtr nephrectomy 2010, secondary to renal cell cancer 01/14/2009  . PERSONAL HX COLONIC POLYPS 11/14/2008  . Hyperlipidemia associated with type 2 diabetes mellitus ([place]) 11/16/2007  . ESOPHAGEAL STRICTURE 10/12/2007  . Rheumatoid arthritis ([place]) 02/27/2007  . Essential hypertension 01/19/2007  . Allergic rhinitis 01/19/2007  . GERD 01/19/2007   Starr Lake PT, DPT, LAT, ATC  10/27/17  10:59 AM         [facility] [address] [place], [place], [address] Phone: [phone]   Fax:  [phone]  Name: [name] MRN: [id number] Date  of Birth: [birth year]

## 2017-10-28 ENCOUNTER — Encounter (HOSPITAL_COMMUNITY): Payer: Self-pay

## 2017-10-28 ENCOUNTER — Telehealth (HOSPITAL_COMMUNITY): Payer: Self-pay | Admitting: *Deleted

## 2017-10-31 ENCOUNTER — Telehealth: Payer: Self-pay | Admitting: Adult Health

## 2017-10-31 NOTE — Telephone Encounter (Signed)
Pt is asking for a call if there is a way to be seen before October for the Neuropathy in her foot and now in her fingers.   Pt currently on 1st available in October and Is on wait list.  Please call if can be seen before October

## 2017-10-31 NOTE — Telephone Encounter (Signed)
Spoke with patient to advise her of Dr Gladstone Lighter recommendations. She stated when she saw hand surgeon last Sept he told her her issues are from her neuropathy and arthritis more than the carpal tunnel. She stated that the numbness and  tingling in her feet and toes has increased. This RN suggested she increase Gabapentin and take 400 mg twice daily until she can be seen to see if that is helpful. She would like to be worked in; scheduled  her for FU in 2 weeks, advised she arrive 30 minutes early to check in. She verbalized understanding, agreement with plan., appreciation for call.

## 2017-10-31 NOTE — Telephone Encounter (Signed)
Hand numbness is not a new problem; it may be worsening of her already diagnosed neuropathy and left carpal tunnel syndrome.   - May increase gabapentin to 400mg  twice a day. - May follow up with hand suegery for left carpal tunnel treatment - May return to clinic for further discussion / evaluation  Penni Bombard, MD 04/03/5055, 9:79 PM Certified in Neurology, Neurophysiology and Neuroimaging  [facility] [address], [place], [place] [address] [phone]

## 2017-11-01 ENCOUNTER — Ambulatory Visit: Payer: Medicare HMO | Attending: Family Medicine | Admitting: Physical Therapy

## 2017-11-01 ENCOUNTER — Encounter: Payer: Self-pay | Admitting: Physical Therapy

## 2017-11-01 VITALS — BP 192/108 | HR 71

## 2017-11-01 DIAGNOSIS — M62838 Other muscle spasm: Secondary | ICD-10-CM | POA: Insufficient documentation

## 2017-11-01 DIAGNOSIS — R293 Abnormal posture: Secondary | ICD-10-CM | POA: Insufficient documentation

## 2017-11-01 DIAGNOSIS — M542 Cervicalgia: Secondary | ICD-10-CM | POA: Insufficient documentation

## 2017-11-01 NOTE — Therapy (Addendum)
[place], [place], [address] Phone: [phone]   Fax:  [phone]  Physical Therapy Treatment / Discharge Summary  Patient Details  Name: [name] MRN: [id number] Date of Birth: [birth year] Referring Provider: [name], [name], MD   Encounter Date: 11/01/2017    Past Medical History:  Diagnosis Date  . Allergy   . Arthritis    RA (Dr. Ouida Sills)  . C. difficile diarrhea    history of  . CAD (coronary artery disease), native coronary artery    a. Cardiac CT with + FFR for Ramus b. cath 05/2017 100% sub acute lesion to mid circumflex artery s/p DES.  . CKD (chronic kidney disease) stage 3, GFR 30-59 ml/min ([place]) 10/11/2016   s/p R nephrectomy  . Colon polyps 2008   HYPERPLASTIC  . Costochondritis    a. Nuc Stress Test 6/16: EF 70, no scar or ischemia, low risk // b. Echo 3/16: mild conc LVH, EF 65-70, no RWMA, Gr 1 DD, mild TR  . Diabetes mellitus without complication ([place])    type 2  . Gastritis   . GERD (gastroesophageal reflux disease)   . History of echocardiogram    Echo 3/18:  Moderate LVH, EF 69-62, grade 1 diastolic dysfunction, calcified aortic valve, mild MR, moderate LAE  . History of nuclear stress test    Myoview 3/18: Mod size and intensity fixed septal defect, may be artifact.Opposite mod size and intensity lat defect, which is reversible and could represent ischemia or possibly artifact (SDS 4). LVEF 71% with normal wall motion. Intermediate risk study. >> images reviewed with Dr. Dorris Carnes - no sig ischemia; med rx   . History of pneumonia   . Hx of cardiovascular stress test    Lexiscan Myoview 6/16:  EF 70%, no scar or ischemia; Low Risk  . Hypertension   . Neuropathy   . Orthostatic hypotension 05/28/2017  . Osteoarthritis   . S/P angioplasty with stent 05/27/17 to LCX with DES  05/28/2017  . Thyroid disease     Past Surgical History:  Procedure Laterality Date  .  ABDOMINAL HYSTERECTOMY  1978  . BACK SURGERY     x 2  . CARPAL TUNNEL RELEASE Left   . COLONOSCOPY W/ BIOPSIES AND POLYPECTOMY     Hx: of  . CORONARY STENT INTERVENTION N/A 05/27/2017   Procedure: CORONARY STENT INTERVENTION;  Surgeon: Burnell Blanks, MD;  Location: [facility];  Service: Cardiovascular;  Laterality: N/A;  . HNP    . LEFT HEART CATH AND CORONARY ANGIOGRAPHY N/A 05/27/2017   Procedure: LEFT HEART CATH AND CORONARY ANGIOGRAPHY;  Surgeon: Burnell Blanks, MD;  Location: [facility];  Service: Cardiovascular;  Laterality: N/A;  . LUMBAR LAMINECTOMY/DECOMPRESSION MICRODISCECTOMY Left 02/12/2013   Procedure: LUMBAR TWO THREE, LUMBAR THREE FOUR, LUMBAR FOUR FIVE  LAMINECTOMY/DECOMPRESSION MICRODISCECTOMY 3 LEVELS;  Surgeon: Charlie Pitter, MD;  Location: [facility];  Service: Neurosurgery;  Laterality: Left;  . NEPHRECTOMY Right 2010   10.rcc cancer  . TOTAL KNEE ARTHROPLASTY Right    Redo    Vitals:   11/01/17 1026  BP: (!) 192/108  Pulse: 71  SpO2: 99%    Subjective Assessment - 11/01/17 1021    Subjective  "The last session I was alittle sore, but the DN really helped. I am feeling off today my neuropathy and my feet are really aggrivated and my hands are really bad today. I did called my  neurologist about getting an appointment since things are worse today.     Currently in Pain?  Yes                                 PT Short Term Goals - 10/04/17 1019      PT SHORT TERM GOAL #1   Title  pt to be I with inital HEP    Time  3    Period  Weeks    Status  New    Target Date  10/25/17      PT SHORT TERM GOAL #2   Title  pt to verbalize/ demo proper posture in multiple positions as well as proper lifting mechanics to prevent/ reduce neck pain3    Time  3    Period  Weeks    Status  New    Target Date  10/25/17        PT Long Term Goals - 10/04/17 1124      PT LONG TERM GOAL #1   Title  increase  cervical extension to >/= 40 degreees, increase bil side bending / rotation by >/=10 degrees with </= 2/10 pain to promote safety with driving    Time  6    Period  Weeks    Status  New    Target Date  11/15/17      PT LONG TERM GOAL #2   Title  pt to report reduce s bil upper trap spasm to reduce pain </= 2/10 and promote cervical mobility     Time  6    Period  Weeks    Status  Achieved    Target Date  11/15/17      PT LONG TERM GOAL #3   Title  Increase FOTO score to </=    Time  6    Period  Weeks    Status  New    Target Date  11/15/17      PT LONG TERM GOAL #4   Title  pt to be I with all HEP given as of last visit to maintain and progress current level of function     Time  6    Period  Weeks    Status  New    Target Date  11/15/17            Plan - 11/01/17 1031    Clinical Impression Statement  Due to pt feeling worse with her peripheral neuropathy and feels off balance due to abnormal sensationin the feet. pt demonstrated increaesd BP 192/108 which is different from her baseline per pt report, which was monitored and 10 mins late she improved  to 154/95. she denied any ligthheadedness or dizziness. opted to hold today and see if pt could get in with her neurologist for further assessment.        Patient will benefit from skilled therapeutic intervention in order to improve the following deficits and impairments:     Visit Diagnosis: Other muscle spasm  Cervicalgia  Abnormal posture     Problem List Patient Active Problem List   Diagnosis Date Noted  . Cervicalgia 09/16/2017  . Orthostatic hypotension 05/28/2017  . S/P angioplasty with stent 05/27/17 to LCX with DES  05/28/2017  . Coronary artery disease involving native coronary artery of native heart with unstable angina pectoris ([place])   . Polyneuropathy 05/25/2017  . Chest pain with moderate risk for cardiac etiology  05/25/2017  . Class 1 obesity with serious comorbidity and body mass index  (BMI) of 34.0 to 34.9 in adult 12/06/2016  . CKD (chronic kidney disease) stage 3, GFR 30-59 ml/min ([facility]) 10/11/2016  . Hot flashes, menopausal 10/01/2016  . Chest pain with moderate risk of acute coronary syndrome 10/25/2014  . Degenerative spondylolisthesis 10/15/2014  . Spinal stenosis, lumbar region, with neurogenic claudication 02/12/2013  . Non-insulin dependent type 2 diabetes mellitus -diet 03/05/2010  . History of Rtr nephrectomy 2010, secondary to renal cell cancer 01/14/2009  . PERSONAL HX COLONIC POLYPS 11/14/2008  . Hyperlipidemia associated with type 2 diabetes mellitus ([place]) 11/16/2007  . ESOPHAGEAL STRICTURE 10/12/2007  . Rheumatoid arthritis ([place]) 02/27/2007  . Essential hypertension 01/19/2007  . Allergic rhinitis 01/19/2007  . GERD 01/19/2007   Starr Lake PT, DPT, LAT, ATC  11/01/17  10:41 AM      [place] [facility] [address] [place], [place], [address] Phone: [phone]   Fax:  [phone]  Name: [name] MRN: [id number] Date of Birth: 09-21-1945       PHYSICAL THERAPY DISCHARGE SUMMARY  Visits from Start of Care: 4  Current functional level related to goals / functional outcomes: See goals   Remaining deficits: Unknown due to pt not returning   Education / Equipment: HEP,   Plan: Patient agrees to discharge.  Patient goals were not met. Patient is being discharged due to not returning since the last visit.  ?????         Asmaa Tirpak PT, DPT, LAT, ATC  12/13/17  11:47 AM

## 2017-11-03 ENCOUNTER — Ambulatory Visit: Payer: Medicare HMO | Admitting: Physical Therapy

## 2017-11-14 ENCOUNTER — Ambulatory Visit: Payer: Medicare HMO | Admitting: Diagnostic Neuroimaging

## 2017-11-14 ENCOUNTER — Encounter: Payer: Self-pay | Admitting: Diagnostic Neuroimaging

## 2017-11-14 VITALS — BP 138/88 | HR 72 | Wt 223.4 lb

## 2017-11-14 DIAGNOSIS — G5602 Carpal tunnel syndrome, left upper limb: Secondary | ICD-10-CM | POA: Diagnosis not present

## 2017-11-14 DIAGNOSIS — G6289 Other specified polyneuropathies: Secondary | ICD-10-CM | POA: Diagnosis not present

## 2017-11-14 MED ORDER — GABAPENTIN 400 MG PO CAPS: 400.0000 mg | ORAL_CAPSULE | Freq: Two times a day (BID) | ORAL | 12 refills | Status: DC

## 2017-11-14 NOTE — Progress Notes (Signed)
[facility]  PATIENT: [name] DOB: 1946/03/13  REFERRING CLINICIAN: G Aryal HISTORY FROM: patient  REASON FOR VISIT: follow up   HISTORICAL  CHIEF COMPLAINT:  Chief Complaint  Patient presents with  . Axonal neuropathy    rm 7, "increase in neuropathy of hands, feet; left hand is swollen/numb; left leg/foot are numb, some numbness of right leg"  . Follow-up    req early due ot increase in neuropathy, symptoms    HISTORY OF PRESENT ILLNESS:   UPDATE (11/14/17, VRP): Since last visit, doing worse with pain in left arm and left leg. Tolerating gabapentin 400mg  . No alleviating or aggravating factors.   UPDATE (09/19/17, MM): She states overall she is doing well.  She does have burning and tingling in the left arm that is worse at night.  She also reports she occasionally has burning in the feet.  She states the compounded cream and gabapentin has been beneficial.  She did see hand surgery for carpal tunnel but he did not feel that it was severe enough to complete surgery per the patient.  The patient also reports that she is been having neck pain.  She did see her primary care who felt that it was muscle spasms and placed on tizanidine.  She returns today for evaluation.  UPDATE 03/16/17: Since last visit, doing a little better with neuropathy cream and gabapentin. Sxs stable. Planning to get second opinion [facility] rheumatology clinic.   PRIOR HPI (12/01/16): 72 year old female with history of rheumatoid arthritis, here for evaluation of left hand numbness and pain. Patient reports numbness in bilateral hands and feet, especially left hand for past 6 months. She describes pain and tenderness in her left fifth digit radiating up to her left elbow. Patient denies any recent accidents or traumas. Patient has history of diabetes, hypertension, renal cell carcinoma, rheumatoid arthritis, currently on leflunomide. Patient presented for EMG nerve conduction study on 10/14/16 which  showed diffuse widespread axonal polyneuropathy as well as superimposed left carpal tunnel syndrome. Patient has history of right carpal tunnel syndrome status post surgery with good results.      REVIEW OF SYSTEMS: Full 14 system review of systems performed and negative with exception of: Fatigue excessive sweating frequent urination dizziness stress swelling agitation frequent waking murmur cough shortness of breath temperature intolerance.   ALLERGIES: Allergies  Allergen Reactions  . Percocet [Oxycodone-Acetaminophen] Hives, Itching and Other (See Comments)    hallucinations  . Pravachol [Pravastatin Sodium] Other (See Comments)    Muscle aches  . Hydrocodone Other (See Comments)    "crazy dreams"  . Aspirin Other (See Comments)    REACTION: GI upset    HOME MEDICATIONS: Outpatient Medications Prior to Visit  Medication Sig Dispense Refill  . acetaminophen (TYLENOL) 500 MG tablet Take 500 mg by mouth 2 (two) times daily as needed (pain).    [name] Kitchen aspirin 81 MG chewable tablet Chew 1 tablet (81 mg total) by mouth daily.    . DULoxetine (CYMBALTA) 30 MG capsule Take 1 capsule (30 mg total) by mouth daily. 30 capsule 1  . fluticasone (FLONASE) 50 MCG/ACT nasal spray Place 1 spray into both nostrils 2 (two) times daily as needed for allergies. 32 g 7  . gabapentin (NEURONTIN) 400 MG capsule Take 1 capsule (400 mg total) by mouth at bedtime. 30 capsule 5  . ipratropium (ATROVENT) 0.06 % nasal spray Place 2 sprays into both nostrils 4 (four) times daily. 15 mL 2  . metoprolol tartrate (LOPRESSOR)  25 MG tablet TAKE 1/2 TABLET BY MOUTH 2 TIMES A DAY 30 tablet 11  . Multiple Vitamin (MULTIVITAMIN) tablet Take 1 tablet by mouth daily.    . nitroGLYCERIN (NITROSTAT) 0.4 MG SL tablet Place 1 tablet (0.4 mg total) under the tongue every 5 (five) minutes as needed for chest pain. 30 tablet 6  . polyethylene glycol powder (GLYCOLAX/MIRALAX) powder Take 17 g by mouth daily. 500 g 3  . ranitidine  (ZANTAC) 150 MG tablet TAKE 1 TABLET BY MOUTH TWICE A DAY 180 tablet 2  . rosuvastatin (CRESTOR) 20 MG tablet Take 1 tablet (20 mg total) by mouth daily at 6 PM. 90 tablet 3  . ticagrelor (BRILINTA) 90 MG TABS tablet Take 1 tablet (90 mg total) by mouth 2 (two) times daily. 60 tablet 10  . tizanidine (ZANAFLEX) 2 MG capsule Take 1 capsule (2 mg total) by mouth 3 (three) times daily. 135 capsule 1  . NONFORMULARY OR COMPOUNDED ITEM Rx for compounded cream faxed to [facility]     No facility-administered medications prior to visit.     PAST MEDICAL HISTORY: Past Medical History:  Diagnosis Date  . Allergy   . Arthritis    RA (Dr. Ouida Sills)  . C. difficile diarrhea    history of  . CAD (coronary artery disease), native coronary artery    a. Cardiac CT with + FFR for Ramus b. cath 05/2017 100% sub acute lesion to mid circumflex artery s/p DES.  . CKD (chronic kidney disease) stage 3, GFR 30-59 ml/min ([place]) 10/11/2016   s/p R nephrectomy  . Colon polyps 2008   HYPERPLASTIC  . Costochondritis    a. Nuc Stress Test 6/16: EF 70, no scar or ischemia, low risk // b. Echo 3/16: mild conc LVH, EF 65-70, no RWMA, Gr 1 DD, mild TR  . Diabetes mellitus without complication ([place])    type 2  . Gastritis   . GERD (gastroesophageal reflux disease)   . History of echocardiogram    Echo 3/18:  Moderate LVH, EF 32-99, grade 1 diastolic dysfunction, calcified aortic valve, mild MR, moderate LAE  . History of nuclear stress test    Myoview 3/18: Mod size and intensity fixed septal defect, may be artifact.Opposite mod size and intensity lat defect, which is reversible and could represent ischemia or possibly artifact (SDS 4). LVEF 71% with normal wall motion. Intermediate risk study. >> images reviewed with Dr. Dorris Carnes - no sig ischemia; med rx   . History of pneumonia   . Hx of cardiovascular stress test    Lexiscan Myoview 6/16:  EF 70%, no scar or ischemia; Low Risk  . Hypertension   .  Neuropathy   . Orthostatic hypotension 05/28/2017  . Osteoarthritis   . S/P angioplasty with stent 05/27/17 to LCX with DES  05/28/2017  . Thyroid disease     PAST SURGICAL HISTORY: Past Surgical History:  Procedure Laterality Date  . ABDOMINAL HYSTERECTOMY  1978  . BACK SURGERY     x 2  . CARPAL TUNNEL RELEASE Left   . COLONOSCOPY W/ BIOPSIES AND POLYPECTOMY     Hx: of  . CORONARY STENT INTERVENTION N/A 05/27/2017   Procedure: CORONARY STENT INTERVENTION;  Surgeon: Burnell Blanks, MD;  Location: [facility];  Service: Cardiovascular;  Laterality: N/A;  . HNP    . LEFT HEART CATH AND CORONARY ANGIOGRAPHY N/A 05/27/2017   Procedure: LEFT HEART CATH AND CORONARY ANGIOGRAPHY;  Surgeon: Burnell Blanks, MD;  Location: [facility];  Service: Cardiovascular;  Laterality: N/A;  . LUMBAR LAMINECTOMY/DECOMPRESSION MICRODISCECTOMY Left 02/12/2013   Procedure: LUMBAR TWO THREE, LUMBAR THREE FOUR, LUMBAR FOUR FIVE  LAMINECTOMY/DECOMPRESSION MICRODISCECTOMY 3 LEVELS;  Surgeon: Charlie Pitter, MD;  Location: [facility];  Service: Neurosurgery;  Laterality: Left;  . NEPHRECTOMY Right 2010   10.rcc cancer  . TOTAL KNEE ARTHROPLASTY Right    Redo    FAMILY HISTORY: Family History  Problem Relation Age of Onset  . Rheum arthritis Mother   . Stroke Mother   . Prostate cancer Father   . Heart disease Father   . Diabetes Brother   . Dementia Brother   . Parkinsonism Brother   . Kidney disease Son   . Diabetes Brother   . Cancer Brother   . Dementia Brother   . Colon cancer Neg Hx   . Esophageal cancer Neg Hx   . Pancreatic cancer Neg Hx   . Liver disease Neg Hx     SOCIAL HISTORY:  Social History   Socioeconomic History  . Marital status: Divorced    Spouse name: Not on file  . Number of children: 3  . Years of education: 58  . Highest education level: Not on file  Occupational History  . Occupation: Retired    Fish farm manager: RETIRED  Social Needs  .  Financial resource strain: Not on file  . Food insecurity:    Worry: Not on file    Inability: Not on file  . Transportation needs:    Medical: Not on file    Non-medical: Not on file  Tobacco Use  . Smoking status: Former Smoker    Years: 15.00    Types: Cigarettes    Last attempt to quit: 05/27/2017    Years since quitting: 0.4  . Smokeless tobacco: Never Used  . Tobacco comment: 03/16/17  1 pk/week  Substance and Sexual Activity  . Alcohol use: No    Alcohol/week: 0.0 oz  . Drug use: No  . Sexual activity: Not on file  Lifestyle  . Physical activity:    Days per week: Not on file    Minutes per session: Not on file  . Stress: Not on file  Relationships  . Social connections:    Talks on phone: Not on file    Gets together: Not on file    Attends religious service: Not on file    Active member of club or organization: Not on file    Attends meetings of clubs or organizations: Not on file    Relationship status: Not on file  . Intimate partner violence:    Fear of current or ex partner: Not on file    Emotionally abused: Not on file    Physically abused: Not on file    Forced sexual activity: Not on file  Other Topics Concern  . Not on file  Social History Narrative   Single, dgtr lives with her   Never Smoked    Alcohol use- no   Drug use-no   Regular Exercise-yes   Former Smoker- 12/2008     PHYSICAL EXAM  GENERAL EXAM/CONSTITUTIONAL: Vitals:  Vitals:   11/14/17 1246  BP: 138/88  Pulse: 72  Weight: 223 lb 6.4 oz (101.3 kg)   Body mass index is 36.06 kg/m. No exam data present  Patient is in no distress; well developed, nourished and groomed; neck is supple  CARDIOVASCULAR:  Examination of carotid arteries is normal; no carotid bruits  Regular rate and rhythm, no murmurs  Examination of peripheral vascular system by observation and palpation is normal  EYES:  Ophthalmoscopic exam of optic discs and posterior segments is normal; no  papilledema or hemorrhages  MUSCULOSKELETAL:  Gait, strength, tone, movements noted in Neurologic exam below  NEUROLOGIC: MENTAL STATUS:  No flowsheet data found.  awake, alert, oriented to person, place and time  recent and remote memory intact  normal attention and concentration  language fluent, comprehension intact, naming intact,   fund of knowledge appropriate  CRANIAL NERVE:   2nd - no papilledema on fundoscopic exam  2nd, 3rd, 4th, 6th - pupils equal and reactive to light, visual fields full to confrontation, extraocular muscles intact, no nystagmus  5th - facial sensation symmetric  7th - facial strength symmetric  8th - hearing intact  9th - palate elevates symmetrically, uvula midline  11th - shoulder shrug symmetric  12th - tongue protrusion midline  MOTOR:   normal bulk and tone, full strength in the BUE, BLE  SENSORY:   normal and symmetric to light touch, temperature, vibration  DECR PP IN BILATERAL HANDS  LEFT HAND SLIGHTLY HYPERSEN TO PP COMPARED TO RIGHT  BORDERLINE PHALEN'S ON LEFT  NEG TINEL'S  COORDINATION:   finger-nose-finger, fine finger movements normal  REFLEXES:   deep tendon reflexes TRACE and symmetric  GAIT/STATION:   narrow based gait; able to walk on toes, heels and tandem; romberg is negative    DIAGNOSTIC DATA (LABS, IMAGING, TESTING) - I reviewed patient records, labs, notes, testing and imaging myself where available.  Lab Results  Component Value Date   WBC 12.5 (H) 05/29/2017   HGB 12.5 05/29/2017   HCT 37.2 05/29/2017   MCV 87.5 05/29/2017   PLT 228 05/29/2017      Component Value Date/Time   NA 140 09/08/2017 1018   K 4.1 09/08/2017 1018   CL 105 09/08/2017 1018   CO2 28 09/08/2017 1018   GLUCOSE 90 09/08/2017 1018   BUN 18 09/08/2017 1018   CREATININE 1.16 09/08/2017 1018   CREATININE 1.18 (H) 10/01/2016 1622   CALCIUM 9.6 09/08/2017 1018   PROT 7.8 05/25/2017 0602   PROT 7.3 12/01/2016  0906   ALBUMIN 3.7 05/25/2017 0602   AST 25 05/25/2017 0602   ALT 17 05/25/2017 0602   ALKPHOS 76 05/25/2017 0602   BILITOT 0.5 05/25/2017 0602   GFRNONAA 39 (L) 05/29/2017 1005   GFRAA 45 (L) 05/29/2017 1005   Lab Results  Component Value Date   CHOL 148 09/08/2017   HDL 59.90 09/08/2017   LDLCALC 72 09/08/2017   LDLDIRECT 171.5 12/06/2011   TRIG 83.0 09/08/2017   CHOLHDL 2 09/08/2017   Lab Results  Component Value Date   HGBA1C 5.9 (H) 05/25/2017   Lab Results  Component Value Date   YPPJKDTO67 124 12/01/2016   Lab Results  Component Value Date   TSH 2.360 12/01/2016    10/14/16 EMG/NCS 1. 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 2. Superimposed left median neuropathy at the wrist consistent with left carpal tunnel syndrome.    ASSESSMENT AND PLAN  73 y.o. year old female here with Rheumatoid arthritis, here for evaluation of numbness and tingling in the hands and feet, especially with tenderness and pain in the left hand and left forearm. Left hand and forearm symptoms may be related to combination of arthritis, peripheral neuropathy and left carpal tunnel syndrome.   We'll check additional lab testing to rule out other  causes of neuropathy. May start treatment of pain symptoms with gabapentin and compounded neuropathy cream as well as left wrist splint for carpal tunnel syndrome. Patient would like to hold off on surgical evaluation of left carpal tunnel syndrome at this time.   Dx left hand pain: rheumatoid arthritis + axonal peripheral neuropathy (due to rheumatoid arthritis or DMT or other causes) + left carpal tunnel syndrome  1. Axonal neuropathy   2. Left carpal tunnel syndrome      PLAN:  NEUROPATHY (due to rheumatoid arthritis or DMT or other cause) - increase gabapentin to 400mg  twice a day  CARPAL TUNNEL SYNDROME - gabapentin and compounded neuropathy cream as well as left wrist splint for carpal  tunnel syndrome.  - patient went to hand surgery clinic --> did not get recommendation for carpal tunnel release surgery  ARTHRITIS PAIN (? seroneg RA vs crystalline arthropathy or other problem) - follow up with rheumatology clinic at [place] ordered this encounter  Medications  . gabapentin (NEURONTIN) 400 MG capsule    Sig: Take 1 capsule (400 mg total) by mouth 2 (two) times daily.    Dispense:  60 capsule    Refill:  12   Return in about 1 year (around 11/15/2018).    Penni Bombard, MD 12/03/5463, 6:81 PM Certified in Neurology, Neurophysiology and Neuroimaging  [facility] [address] [place], [place] [address] [phone]

## 2017-12-08 ENCOUNTER — Telehealth: Payer: Self-pay | Admitting: *Deleted

## 2017-12-08 DIAGNOSIS — M064 Inflammatory polyarthropathy: Secondary | ICD-10-CM | POA: Diagnosis not present

## 2017-12-08 DIAGNOSIS — Z87891 Personal history of nicotine dependence: Secondary | ICD-10-CM | POA: Diagnosis not present

## 2017-12-08 DIAGNOSIS — M1120 Other chondrocalcinosis, unspecified site: Secondary | ICD-10-CM | POA: Diagnosis not present

## 2017-12-08 DIAGNOSIS — E1140 Type 2 diabetes mellitus with diabetic neuropathy, unspecified: Secondary | ICD-10-CM | POA: Diagnosis not present

## 2017-12-08 DIAGNOSIS — Z886 Allergy status to analgesic agent status: Secondary | ICD-10-CM | POA: Diagnosis not present

## 2017-12-08 DIAGNOSIS — R7689 Other specified abnormal immunological findings in serum: Secondary | ICD-10-CM | POA: Insufficient documentation

## 2017-12-08 DIAGNOSIS — R768 Other specified abnormal immunological findings in serum: Secondary | ICD-10-CM | POA: Diagnosis not present

## 2017-12-08 DIAGNOSIS — G629 Polyneuropathy, unspecified: Secondary | ICD-10-CM | POA: Diagnosis not present

## 2017-12-08 DIAGNOSIS — G5602 Carpal tunnel syndrome, left upper limb: Secondary | ICD-10-CM | POA: Diagnosis not present

## 2017-12-08 DIAGNOSIS — Z885 Allergy status to narcotic agent status: Secondary | ICD-10-CM | POA: Diagnosis not present

## 2017-12-08 NOTE — Telephone Encounter (Signed)
Received a call from Clarene Critchley at Dr. Valentina Lucks office [phone] Rheumatology). She stated that Dr. Graylin Shiver has requested a sooner appt for the patient and he sent a message to Dr. Leta Baptist in care everywhere with his concerns. She has requested a call back with an update if there is an appointment change.

## 2017-12-11 ENCOUNTER — Encounter: Payer: Self-pay | Admitting: Family Medicine

## 2017-12-21 DIAGNOSIS — I10 Essential (primary) hypertension: Secondary | ICD-10-CM | POA: Diagnosis not present

## 2017-12-21 DIAGNOSIS — M5412 Radiculopathy, cervical region: Secondary | ICD-10-CM | POA: Diagnosis not present

## 2017-12-21 DIAGNOSIS — Z6835 Body mass index (BMI) 35.0-35.9, adult: Secondary | ICD-10-CM | POA: Diagnosis not present

## 2017-12-23 DIAGNOSIS — G5602 Carpal tunnel syndrome, left upper limb: Secondary | ICD-10-CM | POA: Diagnosis not present

## 2017-12-23 DIAGNOSIS — M180 Bilateral primary osteoarthritis of first carpometacarpal joints: Secondary | ICD-10-CM | POA: Diagnosis not present

## 2017-12-29 ENCOUNTER — Other Ambulatory Visit: Payer: Self-pay | Admitting: Neurosurgery

## 2017-12-29 DIAGNOSIS — M5412 Radiculopathy, cervical region: Secondary | ICD-10-CM

## 2018-01-03 ENCOUNTER — Ambulatory Visit
Admission: RE | Admit: 2018-01-03 | Discharge: 2018-01-03 | Disposition: A | Discharge status: Home / Self Care | Payer: Medicare HMO | Source: Ambulatory Visit | Attending: Neurosurgery | Admitting: Neurosurgery

## 2018-01-03 DIAGNOSIS — M4802 Spinal stenosis, cervical region: Secondary | ICD-10-CM | POA: Diagnosis not present

## 2018-01-03 DIAGNOSIS — M5412 Radiculopathy, cervical region: Secondary | ICD-10-CM

## 2018-01-04 ENCOUNTER — Other Ambulatory Visit: Payer: Self-pay | Admitting: Neurosurgery

## 2018-01-04 DIAGNOSIS — Z6835 Body mass index (BMI) 35.0-35.9, adult: Secondary | ICD-10-CM | POA: Diagnosis not present

## 2018-01-04 DIAGNOSIS — I10 Essential (primary) hypertension: Secondary | ICD-10-CM | POA: Diagnosis not present

## 2018-01-04 DIAGNOSIS — M4802 Spinal stenosis, cervical region: Secondary | ICD-10-CM | POA: Diagnosis not present

## 2018-01-05 ENCOUNTER — Telehealth: Payer: Self-pay | Admitting: *Deleted

## 2018-01-05 NOTE — Telephone Encounter (Signed)
   [facility] Pre-operative Risk Assessment    Request for surgical clearance:  1. What type of surgery is being performed? C5-6, C6-7 ANTERIOR CERVICAL FUSION   2. When is this surgery scheduled? 01/23/18   3. Are there any medications that need to be held prior to surgery and how long? BRILINTA , ASPIRIN  4. Practice name and name of physician performing surgery? [facility]; DR. Mallie Mussel POOL   5. What is your office phone and fax number? [facility] # [phone] EXT 680; FAX # [phone]   5. Anesthesia type (None, local, MAC, general) ? GENERAL    [name] 01/05/2018, 12:16 PM  _________________________________________________________________   (provider comments below)

## 2018-01-05 NOTE — Telephone Encounter (Signed)
Will forward to Dr. Harrington Challenger to finalize recommendation about continuing ASA/Brilinta for 1 year post PCI (through 05/2018) and deferring surgery for now as it cannot be done on this regimen. Anderson Malta made surgical team aware that surgery may need to be postponed contingent on answer. Dr Harrington Challenger - - Please route response to [facility] PREOP (the pre-op pool). Thank you.  Dayna Dunn PA-C

## 2018-01-05 NOTE — Telephone Encounter (Signed)
Spoke with [facility].  To answer the questions below:  A) the surgery will be WITHOUT MEDS B) Not urgent but needed sooner rather than later.

## 2018-01-05 NOTE — Telephone Encounter (Signed)
   Primary Cardiologist: Dorris Carnes, MD  Chart reviewed as part of pre-operative protocol coverage. H/o CAD with DES to [facility] 05/2017, HTN, DM, 1 kidney, RA. Preop request is to come off ASA/Brilinta. Ideally would not want to interrupt dual antiplatelet therapy for at leas 1 year after stenting. Callback team, please call surgeon's office and find out a) if this is a procedure they would still do on both meds and b) what the urgency is for this procedure.   Charlie Pitter, PA-C 01/05/2018, 3:03 PM

## 2018-01-06 NOTE — Telephone Encounter (Signed)
Spoke with pt, aware of recommendations. 

## 2018-01-06 NOTE — Telephone Encounter (Signed)
Agree,  Pt had intervention in October. WOuld recomm continued ASA/BRilinta if possible uninterrupted until then  Stopping would increase risk of thrombosis.

## 2018-01-06 NOTE — Telephone Encounter (Signed)
Reviewed by Dr Harrington Challenger. Pt had PCI in Oct 2018 with DES. Dr Harrington Challenger recommends Brilinta and aspirin not be stopped for one year if at all possible as the chance of stent thrombosis would be increased.   CV Pre OP call back-please inform the patient. I will forward this recommendation to Dr Annette Stable.  Kerin Ransom PA-C 01/06/2018 11:34 AM

## 2018-01-17 ENCOUNTER — Other Ambulatory Visit (HOSPITAL_COMMUNITY): Payer: Medicare HMO

## 2018-01-18 DIAGNOSIS — M180 Bilateral primary osteoarthritis of first carpometacarpal joints: Secondary | ICD-10-CM | POA: Diagnosis not present

## 2018-01-18 DIAGNOSIS — G5602 Carpal tunnel syndrome, left upper limb: Secondary | ICD-10-CM | POA: Diagnosis not present

## 2018-01-23 ENCOUNTER — Inpatient Hospital Stay: Admit: 2018-01-23 | Payer: Medicare HMO | Admitting: Neurosurgery

## 2018-01-23 SURGERY — ANTERIOR CERVICAL DECOMPRESSION/DISCECTOMY FUSION 2 LEVELS
Anesthesia: General

## 2018-03-16 ENCOUNTER — Encounter: Payer: Self-pay | Admitting: Family Medicine

## 2018-03-16 DIAGNOSIS — M1120 Other chondrocalcinosis, unspecified site: Secondary | ICD-10-CM | POA: Diagnosis not present

## 2018-03-16 DIAGNOSIS — M064 Inflammatory polyarthropathy: Secondary | ICD-10-CM | POA: Diagnosis not present

## 2018-03-16 DIAGNOSIS — H524 Presbyopia: Secondary | ICD-10-CM | POA: Diagnosis not present

## 2018-03-16 DIAGNOSIS — H2513 Age-related nuclear cataract, bilateral: Secondary | ICD-10-CM | POA: Diagnosis not present

## 2018-03-16 DIAGNOSIS — G629 Polyneuropathy, unspecified: Secondary | ICD-10-CM | POA: Diagnosis not present

## 2018-03-16 DIAGNOSIS — Z7952 Long term (current) use of systemic steroids: Secondary | ICD-10-CM | POA: Diagnosis not present

## 2018-03-16 DIAGNOSIS — Z79899 Other long term (current) drug therapy: Secondary | ICD-10-CM | POA: Diagnosis not present

## 2018-03-16 DIAGNOSIS — M1990 Unspecified osteoarthritis, unspecified site: Secondary | ICD-10-CM | POA: Diagnosis not present

## 2018-03-16 LAB — HM DIABETES EYE EXAM

## 2018-03-21 ENCOUNTER — Ambulatory Visit: Payer: Medicare HMO | Admitting: Adult Health

## 2018-03-22 ENCOUNTER — Encounter: Payer: Self-pay | Admitting: Adult Health

## 2018-03-22 ENCOUNTER — Encounter: Payer: Self-pay | Admitting: Family Medicine

## 2018-03-22 ENCOUNTER — Ambulatory Visit: Payer: Medicare HMO | Admitting: Adult Health

## 2018-03-22 VITALS — BP 143/87 | HR 71 | Ht 66.0 in | Wt 219.8 lb

## 2018-03-22 DIAGNOSIS — G6289 Other specified polyneuropathies: Secondary | ICD-10-CM | POA: Diagnosis not present

## 2018-03-22 NOTE — Patient Instructions (Signed)
Your Plan:  Continue Gabapentin 400 mg twice a day If we need to increase please let us know If your symptoms worsen or you develop new symptoms please let us know.   Thank you for coming to see [place] at [facility]. I hope we have been able to provide you high quality care today.  You may receive a patient satisfaction survey over the next few weeks. We would appreciate your feedback and comments so that we may continue to improve ourselves and the health of our patients.

## 2018-03-22 NOTE — Progress Notes (Signed)
PATIENT: [name] DOB: 05/18/1946  REASON FOR VISIT: follow up HISTORY FROM: patient  HISTORY OF PRESENT ILLNESS: Today 03/22/18: [name] is a 72 year old female with a history of peripheral neuropathy.  Returns today for follow-up.  She did follow-up with Dr. Fredna Dow and he did injections in the left hand.  According to his office note he was not sure if this was related to carpal tunnel or perhaps her neck.  She did see Dr. Trenton Gammon and had an MRI of the cervical spine and he recommended surgery.  She reports this was scheduled for June but because she has a stent placed her cardiologist did not feel comfortable with her coming off of blood thinners for 7 days.  The surgery is now scheduled for October pending clearance from her cardiologist.  She states that her neuropathy is controlled with gabapentin 400 twice a day.  She states that she has the most discomfort in the left hand.  She does have numbness and tingling in the feet.  She denies any falls.  Typically uses a cane when ambulating.  She reports that her feet sometimes will feel very cold at night.  She returns today for evaluation.  HISTORY UPDATE (11/14/17, VRP): Since last visit, doing worse with pain in left arm and left leg. Tolerating gabapentin 400mg  . No alleviating or aggravating factors.   UPDATE (09/19/17, MM): She states overall she is doing well. She does have burning and tingling in the left arm that is worse at night. She also reports she occasionally has burning in the feet. She states the compounded cream andgabapentin has been beneficial. She did see hand surgery for carpal tunnel but he did not feel that it was severe enough to complete surgery per the patient. The patient also reports that she is been having neck pain. She did see her primary care who felt that it was muscle spasms and placed on tizanidine. She returns today for evaluation.  UPDATE 03/16/17: Since last visit, doing a little better with  neuropathy cream and gabapentin. Sxs stable. Planning to get second opinion [facility] rheumatology clinic.   PRIOR HPI (12/01/16):72 year old female with history of rheumatoid arthritis, here for evaluation of left hand numbness and pain. Patient reports numbness in bilateral hands and feet, especially left hand for past 6 months. She describes pain and tenderness in her left fifth digit radiating up to her left elbow. Patient denies any recent accidents or traumas. Patient has history of diabetes, hypertension, renal cell carcinoma, rheumatoid arthritis, currently on leflunomide. Patient presented for EMG nerve conduction study on 10/14/16 which showed diffuse widespread axonal polyneuropathy as well as superimposed left carpal tunnel syndrome. Patient has history of right carpal tunnel syndrome status post surgery with good results.  REVIEW OF SYSTEMS: Out of a complete 14 system review of symptoms, the patient complains only of the following symptoms, and all other reviewed systems are negative.  See HPI  ALLERGIES: Allergies  Allergen Reactions  . Percocet [Oxycodone-Acetaminophen] Hives, Itching and Other (See Comments)    hallucinations  . Pravachol [Pravastatin Sodium] Other (See Comments)    Muscle aches  . Hydrocodone Other (See Comments)    "crazy dreams"  . Aspirin Other (See Comments)    REACTION: GI upset    HOME MEDICATIONS: Outpatient Medications Prior to Visit  Medication Sig Dispense Refill  . acetaminophen (TYLENOL) 500 MG tablet Take 500 mg by mouth 2 (two) times daily as needed (pain).    [name] Kitchen aspirin 81 MG  chewable tablet Chew 1 tablet (81 mg total) by mouth daily.    . DULoxetine (CYMBALTA) 30 MG capsule Take 1 capsule (30 mg total) by mouth daily. 30 capsule 1  . fluticasone (FLONASE) 50 MCG/ACT nasal spray Place 1 spray into both nostrils 2 (two) times daily as needed for allergies. 32 g 7  . gabapentin (NEURONTIN) 400 MG capsule Take 1 capsule (400 mg total) by mouth 2  (two) times daily. 60 capsule 12  . ipratropium (ATROVENT) 0.06 % nasal spray Place 2 sprays into both nostrils 4 (four) times daily. 15 mL 2  . metoprolol tartrate (LOPRESSOR) 25 MG tablet TAKE 1/2 TABLET BY MOUTH 2 TIMES A DAY 30 tablet 11  . Multiple Vitamin (MULTIVITAMIN) tablet Take 1 tablet by mouth daily.    . nitroGLYCERIN (NITROSTAT) 0.4 MG SL tablet Place 1 tablet (0.4 mg total) under the tongue every 5 (five) minutes as needed for chest pain. 30 tablet 6  . NONFORMULARY OR COMPOUNDED ITEM Rx for compounded cream faxed to [facility]    . polyethylene glycol powder (GLYCOLAX/MIRALAX) powder Take 17 g by mouth daily. 500 g 3  . ranitidine (ZANTAC) 150 MG tablet TAKE 1 TABLET BY MOUTH TWICE A DAY 180 tablet 2  . rosuvastatin (CRESTOR) 20 MG tablet Take 1 tablet (20 mg total) by mouth daily at 6 PM. 90 tablet 3  . ticagrelor (BRILINTA) 90 MG TABS tablet Take 1 tablet (90 mg total) by mouth 2 (two) times daily. 60 tablet 10  . tizanidine (ZANAFLEX) 2 MG capsule Take 1 capsule (2 mg total) by mouth 3 (three) times daily. 135 capsule 1   No facility-administered medications prior to visit.     PAST MEDICAL HISTORY: Past Medical History:  Diagnosis Date  . Allergy   . Arthritis    RA (Dr. Ouida Sills)  . C. difficile diarrhea    history of  . CAD (coronary artery disease), native coronary artery    a. Cardiac CT with + FFR for Ramus b. cath 05/2017 100% sub acute lesion to mid circumflex artery s/p DES.  . CKD (chronic kidney disease) stage 3, GFR 30-59 ml/min ([place]) 10/11/2016   s/p R nephrectomy  . Colon polyps 2008   HYPERPLASTIC  . Costochondritis    a. Nuc Stress Test 6/16: EF 70, no scar or ischemia, low risk // b. Echo 3/16: mild conc LVH, EF 65-70, no RWMA, Gr 1 DD, mild TR  . Diabetes mellitus without complication ([place])    type 2  . Gastritis   . GERD (gastroesophageal reflux disease)   . History of echocardiogram    Echo 3/18:  Moderate LVH, EF 96-04, grade 1 diastolic  dysfunction, calcified aortic valve, mild MR, moderate LAE  . History of nuclear stress test    Myoview 3/18: Mod size and intensity fixed septal defect, may be artifact.Opposite mod size and intensity lat defect, which is reversible and could represent ischemia or possibly artifact (SDS 4). LVEF 71% with normal wall motion. Intermediate risk study. >> images reviewed with Dr. Dorris Carnes - no sig ischemia; med rx   . History of pneumonia   . Hx of cardiovascular stress test    Lexiscan Myoview 6/16:  EF 70%, no scar or ischemia; Low Risk  . Hypertension   . Neuropathy   . Orthostatic hypotension 05/28/2017  . Osteoarthritis   . S/P angioplasty with stent 05/27/17 to LCX with DES  05/28/2017  . Thyroid disease     PAST SURGICAL  HISTORY: Past Surgical History:  Procedure Laterality Date  . ABDOMINAL HYSTERECTOMY  1978  . BACK SURGERY     x 2  . CARPAL TUNNEL RELEASE Left   . COLONOSCOPY W/ BIOPSIES AND POLYPECTOMY     Hx: of  . CORONARY STENT INTERVENTION N/A 05/27/2017   Procedure: CORONARY STENT INTERVENTION;  Surgeon: Burnell Blanks, MD;  Location: [place] [facility];  Service: Cardiovascular;  Laterality: N/A;  . HNP    . LEFT HEART CATH AND CORONARY ANGIOGRAPHY N/A 05/27/2017   Procedure: LEFT HEART CATH AND CORONARY ANGIOGRAPHY;  Surgeon: Burnell Blanks, MD;  Location: [place] [facility];  Service: Cardiovascular;  Laterality: N/A;  . LUMBAR LAMINECTOMY/DECOMPRESSION MICRODISCECTOMY Left 02/12/2013   Procedure: LUMBAR TWO THREE, LUMBAR THREE FOUR, LUMBAR FOUR FIVE  LAMINECTOMY/DECOMPRESSION MICRODISCECTOMY 3 LEVELS;  Surgeon: Charlie Pitter, MD;  Location: [place] [facility];  Service: Neurosurgery;  Laterality: Left;  . NEPHRECTOMY Right 2010   10.rcc cancer  . TOTAL KNEE ARTHROPLASTY Right    Redo    FAMILY HISTORY: Family History  Problem Relation Age of Onset  . Rheum arthritis Mother   . Stroke Mother   . Prostate cancer Father   . Heart disease Father   .  Diabetes Brother   . Dementia Brother   . Parkinsonism Brother   . Kidney disease Son   . Diabetes Brother   . Cancer Brother   . Dementia Brother   . Colon cancer Neg Hx   . Esophageal cancer Neg Hx   . Pancreatic cancer Neg Hx   . Liver disease Neg Hx     SOCIAL HISTORY: Social History   Socioeconomic History  . Marital status: Divorced    Spouse name: Not on file  . Number of children: 3  . Years of education: 40  . Highest education level: Not on file  Occupational History  . Occupation: Retired    Fish farm manager: RETIRED  Social Needs  . Financial resource strain: Not on file  . Food insecurity:    Worry: Not on file    Inability: Not on file  . Transportation needs:    Medical: Not on file    Non-medical: Not on file  Tobacco Use  . Smoking status: Former Smoker    Years: 15.00    Types: Cigarettes    Last attempt to quit: 05/27/2017    Years since quitting: 0.8  . Smokeless tobacco: Never Used  . Tobacco comment: 03/16/17  1 pk/week  Substance and Sexual Activity  . Alcohol use: No    Alcohol/week: 0.0 standard drinks  . Drug use: No  . Sexual activity: Not on file  Lifestyle  . Physical activity:    Days per week: Not on file    Minutes per session: Not on file  . Stress: Not on file  Relationships  . Social connections:    Talks on phone: Not on file    Gets together: Not on file    Attends religious service: Not on file    Active member of club or organization: Not on file    Attends meetings of clubs or organizations: Not on file    Relationship status: Not on file  . Intimate partner violence:    Fear of current or ex partner: Not on file    Emotionally abused: Not on file    Physically abused: Not on file    Forced sexual activity: Not on file  Other Topics Concern  .  Not on file  Social History Narrative   Single, dgtr lives with her   Never Smoked    Alcohol use- no   Drug use-no   Regular Exercise-yes   Former Smoker- 12/2008       PHYSICAL EXAM  Vitals:   03/22/18 1018  BP: (!) 143/87  Pulse: 71  Weight: 219 lb 12.8 oz (99.7 kg)  Height: 5\' 6"  (1.676 m)   Body mass index is 36.06 kg/m.  Generalized: Well developed, in no acute distress   Neurological examination  Mentation: Alert oriented to time, place, history taking. Follows all commands speech and language fluent Cranial nerve II-XII: Pupils were equal round reactive to light. Extraocular movements were full, visual field were full on confrontational test. Facial sensation and strength were normal. Uvula tongue midline. Head turning and shoulder shrug  were normal and symmetric. Motor: The motor testing reveals 5 over 5 strength of all 4 extremities. Good symmetric motor tone is noted throughout.  Sensory: Sensory testing is intact to soft touch on all 4 extremities. No evidence of extinction is noted.  Coordination: Cerebellar testing reveals good finger-nose-finger and heel-to-shin bilaterally.  Gait and station: Patient has a slight limp on the left.  Tandem gait not attempted.  Romberg is negative. Reflexes: Deep tendon reflexes are symmetric and normal bilaterally.   DIAGNOSTIC DATA (LABS, IMAGING, TESTING) - I reviewed patient records, labs, notes, testing and imaging myself where available.  Lab Results  Component Value Date   WBC 12.5 (H) 05/29/2017   HGB 12.5 05/29/2017   HCT 37.2 05/29/2017   MCV 87.5 05/29/2017   PLT 228 05/29/2017      Component Value Date/Time   NA 140 09/08/2017 1018   K 4.1 09/08/2017 1018   CL 105 09/08/2017 1018   CO2 28 09/08/2017 1018   GLUCOSE 90 09/08/2017 1018   BUN 18 09/08/2017 1018   CREATININE 1.16 09/08/2017 1018   CREATININE 1.18 (H) 10/01/2016 1622   CALCIUM 9.6 09/08/2017 1018   PROT 7.8 05/25/2017 0602   PROT 7.3 12/01/2016 0906   ALBUMIN 3.7 05/25/2017 0602   AST 25 05/25/2017 0602   ALT 17 05/25/2017 0602   ALKPHOS 76 05/25/2017 0602   BILITOT 0.5 05/25/2017 0602   GFRNONAA 39  (L) 05/29/2017 1005   GFRAA 45 (L) 05/29/2017 1005   Lab Results  Component Value Date   CHOL 148 09/08/2017   HDL 59.90 09/08/2017   LDLCALC 72 09/08/2017   LDLDIRECT 171.5 12/06/2011   TRIG 83.0 09/08/2017   CHOLHDL 2 09/08/2017   Lab Results  Component Value Date   HGBA1C 5.9 (H) 05/25/2017   Lab Results  Component Value Date   VITAMINB12 555 12/01/2016   Lab Results  Component Value Date   TSH 2.360 12/01/2016      ASSESSMENT AND PLAN 72 y.o. year old female  has a past medical history of Allergy, Arthritis, C. difficile diarrhea, CAD (coronary artery disease), native coronary artery, CKD (chronic kidney disease) stage 3, GFR 30-59 ml/min ([place]) (10/11/2016), Colon polyps (2008), Costochondritis, Diabetes mellitus without complication ([place]), Gastritis, GERD (gastroesophageal reflux disease), History of echocardiogram, History of nuclear stress test, History of pneumonia, cardiovascular stress test, Hypertension, Neuropathy, Orthostatic hypotension (05/28/2017), Osteoarthritis, S/P angioplasty with stent 05/27/17 to LCX with DES  (05/28/2017), and Thyroid disease. here with:  1.  Peripheral neuropathy  The patient will continue on gabapentin 400 twice a day.  I did advise that if her discomfort worsens we could increase this  in the future as needed.  She voiced understanding.  Advised that if her symptoms worsen or she develops new symptoms she should let us know.  She will follow-up in 6 months or sooner if needed.   I spent 15 minutes with the patient. 50% of this time was spent reviewing her plan of care.   Ward Givens, MSN, NP-C 03/22/2018, 10:20 AM [facility] [address] [place], [place] [address] [phone]

## 2018-03-23 ENCOUNTER — Encounter: Payer: Self-pay | Admitting: Internal Medicine

## 2018-04-11 ENCOUNTER — Telehealth: Payer: Self-pay | Admitting: *Deleted

## 2018-04-11 NOTE — Telephone Encounter (Signed)
   [facility] Pre-operative Risk Assessment    Request for surgical clearance:  1. What type of surgery is being performed?  ANTERIOR CERVICAL FUSION C5-6, C6-6   2. When is this surgery scheduled?  05/08/18   3. What type of clearance is required (medical clearance vs. Pharmacy clearance to hold med vs. Both)?  MEDICAL  4. Are there any medications that need to be held prior to surgery and how long?    5. Practice name and name of physician performing surgery?  [facility], DR. POOL   6. What is your office phone number [phone]    7.   What is your office fax number [phone]  8.   Anesthesia type (None, local, MAC, general) ? GENERAL   [name] 04/11/2018, 3:40 PM  _________________________________________________________________   (provider comments below)

## 2018-04-12 NOTE — Telephone Encounter (Signed)
Patient has an appointment already scheduled with Dr Harrington Challenger on Monday. Patient aware and voiced understanding.

## 2018-04-12 NOTE — Telephone Encounter (Signed)
   Primary Cardiologist:Paula Harrington Challenger, MD  Chart reviewed as part of pre-operative protocol coverage. Because of [name]'s past medical history and time since last visit, he/she will require a follow-up visit in order to better assess preoperative cardiovascular risk.  Pre-op covering staff: - Please schedule appointment and call patient to inform them. - Please contact requesting surgeon's office via preferred method (i.e, phone, fax) to inform them of need for appointment prior to surgery.  Kerin Ransom, PA-C  04/12/2018, 3:41 PM

## 2018-04-17 ENCOUNTER — Encounter: Payer: Self-pay | Admitting: Internal Medicine

## 2018-04-17 ENCOUNTER — Ambulatory Visit: Payer: Medicare HMO | Admitting: Internal Medicine

## 2018-04-17 VITALS — BP 142/84 | HR 74 | Ht 66.0 in | Wt 219.0 lb

## 2018-04-17 DIAGNOSIS — I2510 Atherosclerotic heart disease of native coronary artery without angina pectoris: Secondary | ICD-10-CM

## 2018-04-17 DIAGNOSIS — E785 Hyperlipidemia, unspecified: Secondary | ICD-10-CM

## 2018-04-17 DIAGNOSIS — I10 Essential (primary) hypertension: Secondary | ICD-10-CM

## 2018-04-17 DIAGNOSIS — E1169 Type 2 diabetes mellitus with other specified complication: Secondary | ICD-10-CM | POA: Diagnosis not present

## 2018-04-17 MED ORDER — METOPROLOL TARTRATE 25 MG PO TABS: 25.0000 mg | ORAL_TABLET | Freq: Two times a day (BID) | ORAL | 3 refills | Status: DC

## 2018-04-17 NOTE — Progress Notes (Signed)
Cardiology Office Note   Date:  04/17/2018   ID:  IZORA BENN, DOB [birth year], MRN [id number]  PCP:  [name], [name], MD  Cardiologist:   Dorris Carnes, MD   F/U of CAD      History of Present Illness: [name] is a 72 y.o. female with a history of HTN, DM, 1 kidney  And RA ALso history of CP   She was admitted on October 2018 For CP  CT neg for PE  Cardiac CT with positive FFR for ramus.Cath showed 100% sub acute lesion in mid LCx  Underwent PTCA/DES.  Ehco showed normal LVEF .    I saw the pt in Feb 2019    Since seen her breathing is OK   She denies CP     She is being evaluated for back surgery    Tentatively sched for October 7   Told to hold brilinta after      Current Meds  Medication Sig  . acetaminophen (TYLENOL) 500 MG tablet Take 500 mg by mouth 2 (two) times daily as needed (pain).  [name] Kitchen aspirin 81 MG chewable tablet Chew 1 tablet (81 mg total) by mouth daily.  . DULoxetine (CYMBALTA) 30 MG capsule Take 1 capsule (30 mg total) by mouth daily.  . fluticasone (FLONASE) 50 MCG/ACT nasal spray Place 1 spray into both nostrils 2 (two) times daily as needed for allergies.  [name] Kitchen gabapentin (NEURONTIN) 400 MG capsule Take 1 capsule (400 mg total) by mouth 2 (two) times daily.  [name] Kitchen ipratropium (ATROVENT) 0.06 % nasal spray Place 2 sprays into both nostrils 4 (four) times daily.  . metoprolol tartrate (LOPRESSOR) 25 MG tablet TAKE 1/2 TABLET BY MOUTH 2 TIMES A DAY  . Multiple Vitamin (MULTIVITAMIN) tablet Take 1 tablet by mouth daily.  . nitroGLYCERIN (NITROSTAT) 0.4 MG SL tablet Place 1 tablet (0.4 mg total) under the tongue every 5 (five) minutes as needed for chest pain.  . NONFORMULARY OR COMPOUNDED ITEM Rx for compounded cream faxed to [facility]  . polyethylene glycol powder (GLYCOLAX/MIRALAX) powder Take 17 g by mouth daily.  . ranitidine (ZANTAC) 150 MG tablet TAKE 1 TABLET BY MOUTH TWICE A DAY  . rosuvastatin (CRESTOR) 20 MG tablet Take 1 tablet (20 mg total) by mouth  daily at 6 PM.  . ticagrelor (BRILINTA) 90 MG TABS tablet Take 1 tablet (90 mg total) by mouth 2 (two) times daily.  . tizanidine (ZANAFLEX) 2 MG capsule Take 1 capsule (2 mg total) by mouth 3 (three) times daily.     Allergies:   Percocet [oxycodone-acetaminophen]; Pravachol [pravastatin sodium]; Hydrocodone; and Aspirin   Past Medical History:  Diagnosis Date  . Allergy   . Arthritis    RA (Dr. Ouida Sills)  . C. difficile diarrhea    history of  . CAD (coronary artery disease), native coronary artery    a. Cardiac CT with + FFR for Ramus b. cath 05/2017 100% sub acute lesion to mid circumflex artery s/p DES.  . CKD (chronic kidney disease) stage 3, GFR 30-59 ml/min ([place]) 10/11/2016   s/p R nephrectomy  . Colon polyps 2008   HYPERPLASTIC  . Costochondritis    a. Nuc Stress Test 6/16: EF 70, no scar or ischemia, low risk // b. Echo 3/16: mild conc LVH, EF 65-70, no RWMA, Gr 1 DD, mild TR  . Diabetes mellitus without complication ([place])    type 2  . Gastritis   . GERD (gastroesophageal reflux disease)   .  History of echocardiogram    Echo 3/18:  Moderate LVH, EF 97-67, grade 1 diastolic dysfunction, calcified aortic valve, mild MR, moderate LAE  . History of nuclear stress test    Myoview 3/18: Mod size and intensity fixed septal defect, may be artifact.Opposite mod size and intensity lat defect, which is reversible and could represent ischemia or possibly artifact (SDS 4). LVEF 71% with normal wall motion. Intermediate risk study. >> images reviewed with Dr. Dorris Carnes - no sig ischemia; med rx   . History of pneumonia   . Hx of cardiovascular stress test    Lexiscan Myoview 6/16:  EF 70%, no scar or ischemia; Low Risk  . Hypertension   . Neuropathy   . Orthostatic hypotension 05/28/2017  . Osteoarthritis   . S/P angioplasty with stent 05/27/17 to LCX with DES  05/28/2017  . Thyroid disease     Past Surgical History:  Procedure Laterality Date  . ABDOMINAL HYSTERECTOMY  1978    . BACK SURGERY     x 2  . CARPAL TUNNEL RELEASE Left   . COLONOSCOPY W/ BIOPSIES AND POLYPECTOMY     Hx: of  . CORONARY STENT INTERVENTION N/A 05/27/2017   Procedure: CORONARY STENT INTERVENTION;  Surgeon: Burnell Blanks, MD;  Location: [place] [facility];  Service: Cardiovascular;  Laterality: N/A;  . HNP    . LEFT HEART CATH AND CORONARY ANGIOGRAPHY N/A 05/27/2017   Procedure: LEFT HEART CATH AND CORONARY ANGIOGRAPHY;  Surgeon: Burnell Blanks, MD;  Location: [place] [facility];  Service: Cardiovascular;  Laterality: N/A;  . LUMBAR LAMINECTOMY/DECOMPRESSION MICRODISCECTOMY Left 02/12/2013   Procedure: LUMBAR TWO THREE, LUMBAR THREE FOUR, LUMBAR FOUR FIVE  LAMINECTOMY/DECOMPRESSION MICRODISCECTOMY 3 LEVELS;  Surgeon: Charlie Pitter, MD;  Location: [place] [facility];  Service: Neurosurgery;  Laterality: Left;  . NEPHRECTOMY Right 2010   10.rcc cancer  . TOTAL KNEE ARTHROPLASTY Right    Redo     Social History:  The patient  reports that she quit smoking about 10 months ago. Her smoking use included cigarettes. She quit after 15.00 years of use. She has never used smokeless tobacco. She reports that she does not drink alcohol or use drugs.   Family History:  The patient's family history includes Cancer in her brother; Dementia in her brother and brother; Diabetes in her brother and brother; Heart disease in her father; Kidney disease in her son; Parkinsonism in her brother; Prostate cancer in her father; Rheum arthritis in her mother; Stroke in her mother.    ROS:  Please see the history of present illness. All other systems are reviewed and  Negative to the above problem except as noted.    PHYSICAL EXAM: VS:  BP (!) 142/84   Pulse 74   Ht 5\' 6"  (1.676 m)   Wt 219 lb (99.3 kg)   SpO2 98%   BMI 35.35 kg/m   GEN: Well nourished, well developed, in no acute distress  HEENT: normal  Neck: no JVD, carotid bruits, or masses Cardiac: RRR; no murmurs, rubs, or gallops,no  edema  Respiratory:  clear to auscultation bilaterally, normal work of breathing GI: soft, nontender, nondistended, + BS  No hepatomegaly  MS: no deformity Moving all extremities   Skin: warm and dry, no rash Neuro:  Strength and sensation are intact Psych: euthymic mood, full affect   EKG:  EKG is not ordered today.   Lipid Panel    Component Value Date/Time   CHOL 148 09/08/2017 1018  TRIG 83.0 09/08/2017 1018   HDL 59.90 09/08/2017 1018   CHOLHDL 2 09/08/2017 1018   VLDL 16.6 09/08/2017 1018   LDLCALC 72 09/08/2017 1018   LDLDIRECT 171.5 12/06/2011 0812      Wt Readings from Last 3 Encounters:  04/17/18 219 lb (99.3 kg)  03/22/18 219 lb 12.8 oz (99.7 kg)  11/14/17 223 lb 6.4 oz (101.3 kg)      ASSESSMENT AND PLAN:  1  CAD  S/P intervention in October  DOing good  Keep on DAPT to complete 1 year   WIll contact neurosurgery  Re timing    2  HL  Last lipisd LDL 72 Down from 172 5 months ago  Keep on Crestor  3  HTN  BP is fair   I would not make changes     F/U in Feb 2020     Current medicines are reviewed at length with the patient today.  The patient does not have concerns regarding medicines.  Signed, Dorris Carnes, MD  04/17/2018 8:58 AM    [address], [place], [place]  [address] Phone: [phone]; Fax: [phone]

## 2018-04-17 NOTE — Patient Instructions (Signed)
Your physician has recommended you make the following change in your medication:  1.) increase metoprolol to 25 mg (whole tablet) twice a day  Your physician wants you to follow-up in: February, 2020 with Dr. Harrington Challenger.  You will receive a reminder letter in the mail two months in advance. If you don't receive a letter, please call our office to schedule the follow-up appointment.

## 2018-04-19 NOTE — Progress Notes (Signed)
I reviewed note and agree with plan.   VIKRAM R. PENUMALLI, MD  Certified in Neurology, Neurophysiology and Neuroimaging  [facility] [address], [address] [place], [place] [address] [phone]   

## 2018-04-20 ENCOUNTER — Telehealth: Payer: Self-pay | Admitting: Internal Medicine

## 2018-04-20 NOTE — Telephone Encounter (Signed)
------------------------------------------------------------    Notes from OV 04/17/18 by Dr. Harrington Challenger:  ASSESSMENT AND PLAN:  1  CAD  S/P intervention in October  DOing good  Keep on DAPT to complete 1 year   WIll contact neurosurgery  Re timing    2  HL  Last lipisd LDL 72 Down from 172 5 months ago  Keep on Crestor  3  HTN  BP is fair   I would not make changes       ______________________________________________ Will route to Dr. Harrington Challenger to advise./mw

## 2018-04-20 NOTE — Telephone Encounter (Signed)
New message  Patient states that she was in the office on 04/17/2018. Patient states that Dr. Harrington Challenger was supposed to get in contact with the Dr. Who put the stent in. Patient has questions about clearance for surgery in October. Please call to discuss.

## 2018-04-23 NOTE — Telephone Encounter (Signed)
I sent memo to Dr Irven Baltimore office to see about postponing for a few more wks to allow almost a year I have not heard back   Please forward note

## 2018-04-24 NOTE — Telephone Encounter (Signed)
Informed patient that a message was sent to Dr. Irven Baltimore office.  The pt is aware Dr. Harrington Challenger would like to see if procedure can be postponed to allow for almost a year of DAPT.  Will fax to Dr. Irven Baltimore office.

## 2018-04-28 ENCOUNTER — Other Ambulatory Visit: Payer: Self-pay | Admitting: Neurosurgery

## 2018-04-28 NOTE — Pre-Procedure Instructions (Signed)
[name]  04/28/2018      [facility] # [place], [address] [place] [address] Phone: [phone] Fax: [phone]    Your procedure is scheduled on Monday October 7th.  Report to [facility] Admitting at 0600 A.M.  Call this number if you have problems the morning of surgery:  [phone]   Remember:  Do not eat or drink after midnight.    Take these medicines the morning of surgery with A SIP OF WATER   Tylenol (if needed)  Metoprolol  Zantac    7 days prior to surgery STOP taking any Aspirin(unless otherwise instructed by your surgeon), Aleve, Naproxen, Ibuprofen, Motrin, Advil, Goody's, BC's, all herbal medications, fish oil, and all vitamins     Do not wear jewelry, make-up or nail polish.  Do not wear lotions, powders, or perfumes, or deodorant.  Do not shave 48 hours prior to surgery.  Men may shave face and neck.  Do not bring valuables to the hospital.  [facility] is not responsible for any belongings or valuables.  Contacts, dentures or bridgework may not be worn into surgery.  Leave your suitcase in the car.  After surgery it may be brought to your room.  For patients admitted to the hospital, discharge time will be determined by your treatment team.  Patients discharged the day of surgery will not be allowed to drive home.    [facility]- Preparing For Surgery  Before surgery, you can play an important role. Because skin is not sterile, your skin needs to be as free of germs as possible. You can reduce the number of germs on your skin by washing with CHG (chlorahexidine gluconate) Soap before surgery.  CHG is an antiseptic cleaner which kills germs and bonds with the skin to continue killing germs even after washing.    Oral Hygiene is also important to reduce your risk of infection.  Remember - BRUSH YOUR TEETH THE MORNING OF SURGERY WITH YOUR REGULAR TOOTHPASTE  Please do not use if  you have an allergy to CHG or antibacterial soaps. If your skin becomes reddened/irritated stop using the CHG.  Do not shave (including legs and underarms) for at least 48 hours prior to first CHG shower. It is OK to shave your face.  Please follow these instructions carefully.   1. Shower the NIGHT BEFORE SURGERY and the MORNING OF SURGERY with CHG.   2. If you chose to wash your hair, wash your hair first as usual with your normal shampoo.  3. After you shampoo, rinse your hair and body thoroughly to remove the shampoo.  4. Use CHG as you would any other liquid soap. You can apply CHG directly to the skin and wash gently with a scrungie or a clean washcloth.   5. Apply the CHG Soap to your body ONLY FROM THE NECK DOWN.  Do not use on open wounds or open sores. Avoid contact with your eyes, ears, mouth and genitals (private parts). Wash Face and genitals (private parts)  with your normal soap.  6. Wash thoroughly, paying special attention to the area where your surgery will be performed.  7. Thoroughly rinse your body with warm water from the neck down.  8. DO NOT shower/wash with your normal soap after using and rinsing off the CHG Soap.  9. Pat yourself dry with a CLEAN TOWEL.  10. Wear CLEAN PAJAMAS to bed the night  before surgery, wear comfortable clothes the morning of surgery  11. Place CLEAN SHEETS on your bed the night of your first shower and DO NOT SLEEP WITH PETS.    Day of Surgery:  Do not apply any deodorants/lotions.  Please wear clean clothes to the hospital/surgery center.   Remember to brush your teeth WITH YOUR REGULAR TOOTHPASTE.    Please read over the following fact sheets that you were given. Coughing and Deep Breathing, MRSA Information and Surgical Site Infection Prevention

## 2018-05-01 ENCOUNTER — Encounter (HOSPITAL_COMMUNITY): Payer: Self-pay

## 2018-05-01 ENCOUNTER — Encounter (HOSPITAL_COMMUNITY)
Admission: RE | Admit: 2018-05-01 | Discharge: 2018-05-01 | Disposition: A | Discharge status: Home / Self Care | Payer: Medicare HMO | Source: Ambulatory Visit | Attending: Neurosurgery | Admitting: Neurosurgery

## 2018-05-01 ENCOUNTER — Other Ambulatory Visit: Payer: Self-pay

## 2018-05-01 ENCOUNTER — Telehealth: Payer: Self-pay

## 2018-05-01 DIAGNOSIS — E1122 Type 2 diabetes mellitus with diabetic chronic kidney disease: Secondary | ICD-10-CM | POA: Diagnosis not present

## 2018-05-01 DIAGNOSIS — E119 Type 2 diabetes mellitus without complications: Secondary | ICD-10-CM | POA: Diagnosis present

## 2018-05-01 DIAGNOSIS — Z7951 Long term (current) use of inhaled steroids: Secondary | ICD-10-CM | POA: Diagnosis not present

## 2018-05-01 DIAGNOSIS — Z905 Acquired absence of kidney: Secondary | ICD-10-CM | POA: Insufficient documentation

## 2018-05-01 DIAGNOSIS — M4312 Spondylolisthesis, cervical region: Secondary | ICD-10-CM | POA: Diagnosis not present

## 2018-05-01 DIAGNOSIS — K219 Gastro-esophageal reflux disease without esophagitis: Secondary | ICD-10-CM | POA: Diagnosis not present

## 2018-05-01 DIAGNOSIS — Z85528 Personal history of other malignant neoplasm of kidney: Secondary | ICD-10-CM | POA: Insufficient documentation

## 2018-05-01 DIAGNOSIS — Z01818 Encounter for other preprocedural examination: Secondary | ICD-10-CM | POA: Diagnosis not present

## 2018-05-01 DIAGNOSIS — Z79899 Other long term (current) drug therapy: Secondary | ICD-10-CM | POA: Diagnosis not present

## 2018-05-01 DIAGNOSIS — Z7982 Long term (current) use of aspirin: Secondary | ICD-10-CM | POA: Diagnosis not present

## 2018-05-01 DIAGNOSIS — M5030 Other cervical disc degeneration, unspecified cervical region: Secondary | ICD-10-CM | POA: Diagnosis not present

## 2018-05-01 DIAGNOSIS — I2510 Atherosclerotic heart disease of native coronary artery without angina pectoris: Secondary | ICD-10-CM | POA: Diagnosis not present

## 2018-05-01 DIAGNOSIS — I129 Hypertensive chronic kidney disease with stage 1 through stage 4 chronic kidney disease, or unspecified chronic kidney disease: Secondary | ICD-10-CM | POA: Insufficient documentation

## 2018-05-01 DIAGNOSIS — Z87891 Personal history of nicotine dependence: Secondary | ICD-10-CM | POA: Insufficient documentation

## 2018-05-01 DIAGNOSIS — N183 Chronic kidney disease, stage 3 (moderate): Secondary | ICD-10-CM | POA: Diagnosis not present

## 2018-05-01 DIAGNOSIS — I10 Essential (primary) hypertension: Secondary | ICD-10-CM | POA: Insufficient documentation

## 2018-05-01 HISTORY — DX: Myoneural disorder, unspecified: G70.9

## 2018-05-01 HISTORY — DX: Malignant (primary) neoplasm, unspecified: C80.1

## 2018-05-01 LAB — CBC
HCT: 43.4 % (ref 36.0–46.0)
Hemoglobin: 14.1 g/dL (ref 12.0–15.0)
MCH: 29.3 pg (ref 26.0–34.0)
MCHC: 32.5 g/dL (ref 30.0–36.0)
MCV: 90 fL (ref 78.0–100.0)
Platelets: 241 10*3/uL (ref 150–400)
RBC: 4.82 MIL/uL (ref 3.87–5.11)
RDW: 15.9 % — ABNORMAL HIGH (ref 11.5–15.5)
WBC: 7 10*3/uL (ref 4.0–10.5)

## 2018-05-01 LAB — SURGICAL PCR SCREEN
MRSA, PCR: NEGATIVE
Staphylococcus aureus: NEGATIVE

## 2018-05-01 LAB — GLUCOSE, CAPILLARY: Glucose-Capillary: 165 mg/dL — ABNORMAL HIGH (ref 70–99)

## 2018-05-01 LAB — BASIC METABOLIC PANEL WITH GFR
Anion gap: 14 (ref 5–15)
BUN: 15 mg/dL (ref 8–23)
CO2: 18 mmol/L — ABNORMAL LOW (ref 22–32)
Calcium: 9.2 mg/dL (ref 8.9–10.3)
Chloride: 108 mmol/L (ref 98–111)
Creatinine, Ser: 1.17 mg/dL — ABNORMAL HIGH (ref 0.44–1.00)
GFR calc Af Amer: 52 mL/min — ABNORMAL LOW
GFR calc non Af Amer: 45 mL/min — ABNORMAL LOW
Glucose, Bld: 122 mg/dL — ABNORMAL HIGH (ref 70–99)
Potassium: 3.6 mmol/L (ref 3.5–5.1)
Sodium: 140 mmol/L (ref 135–145)

## 2018-05-01 NOTE — Progress Notes (Signed)
PCP - [name] MD Cardiologist - Dr. Dorris Carnes  Chest x-ray - 05/25/17 EKG - 05/01/18 Stress Test - 2018 ECHO -  2018 Cardiac Cath -  2018  Fasting Blood Sugar - 100-110s Checks Blood Sugar 2 times per week  Blood Thinner Instructions: pt to call Dr. Harrington Challenger and ask when to stop Brilinta Aspirin Instructions: pt to call Dr. Harrington Challenger and ask when to stop ASA  Anesthesia review: Cardiac Clearance, EKG review  Patient denies shortness of breath, fever, cough and chest pain at PAT appointment   Patient verbalized understanding of instructions that were given to them at the PAT appointment. Patient was also instructed that they will need to review over the PAT instructions again at home before surgery.

## 2018-05-01 NOTE — Telephone Encounter (Signed)
   [facility] Pre-operative Risk Assessment    Request for surgical clearance:  1. What type of surgery is being performed?  ANTERIOR CERVICAL FUSION C5-6, C6-6   2. When is this surgery scheduled?  05/29/18   3. What type of clearance is required (medical clearance vs. Pharmacy clearance to hold med vs. Both)? Pharmacy  4. Are there any medications that need to be held prior to surgery and how long?  Brilinta and Aspirin  5. Practice name and name of physician performing surgery?  [facility], DR. POOL   6. What is your office phone number [phone] EXT [phone]    7.   What is your office fax number [phone]  8.   Anesthesia type (None, local, MAC, general) ? GENERAL

## 2018-05-02 NOTE — Telephone Encounter (Signed)
   Primary Cardiologist: Dorris Carnes, MD  Chart reviewed as part of pre-operative protocol coverage. Dr. Harrington Challenger, Can you provide clearance? You have seen the patient 2 weeks ago. Last PCI 05/27/17.  Please forward your response to P [facility] DIV PREOP.   Thank you  Leanor Kail, PA 05/02/2018, 1:03 PM

## 2018-05-02 NOTE — Telephone Encounter (Signed)
Pt seen in clinic a couple weeks ago Doing well From cardiac standpoint he is low risk for major cardiac event   OK to proceed with surgery Stop Brilinta 5 days prior  After surgery would start Plavix 75 when safe surgerically

## 2018-05-02 NOTE — Progress Notes (Addendum)
Anesthesia Chart Review:  Case:  [id number] Date/Time:  05/29/18 0745   Procedure:  ACDF - C5-C6 - C6-C7 (N/A )   Anesthesia type:  General   Pre-op diagnosis:  Stenosis   Location:  [facility] OR ROOM 9 / [name]   Surgeon:  Rose Larsson, MD      DISCUSSION: 72 yo female former smoker. Pertinent hx includes HTN, GERD, Gastritis, CKD 3 (s/p R nephrectomy for renal CA), DMII, RA vs CPPD arthropathy, CAD (s/p PTCA/DES to mid LCx 05/2017).  Surgery was initially postponed per Dr. Alan Blackwell recommendation to allow for 1 full year of DAPT following DES placement on 05/27/2017.   Per her telephone encounter 05/02/2018 "From cardiac standpoint she is low risk for major cardiac event   OK to proceed with surgery. Stop Brilinta 5 days prior. After surgery would start Plavix 75 when safe surgically".  Anticipate she can proceed as planned barring acute status change.   VS: BP (!) 149/78   Resp 20   Ht 5\' 6"  (1.676 m)   Wt 98.4 kg   SpO2 98%   BMI 35.02 kg/m   PROVIDERS: [name], [name], MD is PCP  Wendall Mola, MD is Rheumatologist  Dorris Carnes, MD is Cardiologsit  LABS: Labs reviewed: Acceptable for surgery. Mildly elevated creatinine c/w pt's CKD s/p R nephrectomy. (all labs ordered are listed, but only abnormal results are displayed)  Labs Reviewed  GLUCOSE, CAPILLARY - Abnormal; Notable for the following components:      Result Value   Glucose-Capillary 165 (*)    All other components within normal limits  BASIC METABOLIC PANEL - Abnormal; Notable for the following components:   CO2 18 (*)    Glucose, Bld 122 (*)    Creatinine, Ser 1.17 (*)    GFR calc non Af Amer 45 (*)    GFR calc Af Amer 52 (*)    All other components within normal limits  CBC - Abnormal; Notable for the following components:   RDW 15.9 (*)    All other components within normal limits  SURGICAL PCR SCREEN  HEMOGLOBIN A1C     IMAGES: X-RAY CERVICAL SPINE (AP, LAT, FLEX/EX), 07/21/2017 (care  everywhere)  INDICATION: arthritis- ? RA, CPPD arthropathy + OA \ \ M06.09 Rheumatoid arthritis of multiple sites with negative rheumatoid factor (HCC) \ M11.20 Chondrocalcinosis  COMPARISON: None.  CONCLUSION:  1.  No acute fracture. 2.  No prevertebral swelling. 3.  Moderate C5-C6 degenerative disc disease. 4.  Trace C4-C5 anterolisthesis with flexion which reduces on extension.   EKG: 05/01/2018: Normal sinus rhythm. Possible Left atrial enlargement. Left axis deviation. Left ventricular hypertrophy. Inferior infarct , age undetermined  CV: Cath and PCI 05/27/2017:  Mid RCA lesion, 20 %stenosed.  A STENT SYNERGY DES 3X28 drug eluting stent was successfully placed.  Mid Cx lesion, 100 %stenosed.  Post intervention, there is a 0% residual stenosis.  Ramus lesion, 50 %stenosed.  Dist LAD lesion, 30 %stenosed.  Prox LAD to Mid LAD lesion, 20 %stenosed.  Mid LAD lesion, 20 %stenosed.   1. Mild non-obstructive disease in the LAD and RCA 2. Moderate stenosis in the distal segment of the moderate caliber intermediate branch 3. Chronic total occlusion of the mid Circumflex artery. There is dye staining in the occluded segment suggesting sub-acute lesion. The distal segment and OM branch fills from left to left collaterals slowly.  4. Successful PTCA/DES x 1 mid Circumflex artery  Recommendations: Will continue DAPT with ASA and Brilinta. She does  not have a true ASA allergy. High dose statin. Continue beta blocker.   TTE 05/27/2017: Study Conclusions  - Left ventricle: The cavity size was normal. There was mild   concentric hypertrophy. Systolic function was vigorous. The   estimated ejection fraction was in the range of 65% to 70%. Wall   motion was normal; there were no regional wall motion   abnormalities. Doppler parameters are consistent with abnormal   left ventricular relaxation (grade 1 diastolic dysfunction). - Tricuspid valve: There was trivial  regurgitation.  Impressions:  - Compared to the prior study, there has been no significant   interval change.   Past Medical History:  Diagnosis Date  . Allergy   . Arthritis    RA (Dr. Ouida Sills) Bilateral hands  . C. difficile diarrhea    history of  . CAD (coronary artery disease), native coronary artery    a. Cardiac CT with + FFR for Ramus b. cath 05/2017 100% sub acute lesion to mid circumflex artery s/p DES.  . Cancer ([place])    Right kidney CA removed.   . CKD (chronic kidney disease) stage 3, GFR 30-59 ml/min ([place]) 10/11/2016   s/p R nephrectomy  . Colon polyps 2008   HYPERPLASTIC  . Costochondritis    a. Nuc Stress Test 6/16: EF 70, no scar or ischemia, low risk // b. Echo 3/16: mild conc LVH, EF 65-70, no RWMA, Gr 1 DD, mild TR  . Diabetes mellitus without complication ([place])    type 2  . Gastritis   . GERD (gastroesophageal reflux disease)   . History of echocardiogram    Echo 3/18:  Moderate LVH, EF 00-86, grade 1 diastolic dysfunction, calcified aortic valve, mild MR, moderate LAE  . History of nuclear stress test    Myoview 3/18: Mod size and intensity fixed septal defect, may be artifact.Opposite mod size and intensity lat defect, which is reversible and could represent ischemia or possibly artifact (SDS 4). LVEF 71% with normal wall motion. Intermediate risk study. >> images reviewed with Dr. Dorris Carnes - no sig ischemia; med rx   . History of pneumonia   . Hx of cardiovascular stress test    Lexiscan Myoview 6/16:  EF 70%, no scar or ischemia; Low Risk  . Hypertension   . Neuromuscular disorder ([place])    Neruopathy in bilateral feet  . Neuropathy   . Orthostatic hypotension 05/28/2017  . Osteoarthritis   . Pneumonia   . S/P angioplasty with stent 05/27/17 to LCX with DES  05/28/2017  . Thyroid disease     Past Surgical History:  Procedure Laterality Date  . ABDOMINAL HYSTERECTOMY  1978  . BACK SURGERY     x 2  . CARPAL TUNNEL RELEASE Left   .  COLONOSCOPY W/ BIOPSIES AND POLYPECTOMY     Hx: of  . CORONARY STENT INTERVENTION N/A 05/27/2017   Procedure: CORONARY STENT INTERVENTION;  Surgeon: Burnell Blanks, MD;  Location: [facility];  Service: Cardiovascular;  Laterality: N/A;  . ESOPHAGOGASTRODUODENOSCOPY    . HNP    . LEFT HEART CATH AND CORONARY ANGIOGRAPHY N/A 05/27/2017   Procedure: LEFT HEART CATH AND CORONARY ANGIOGRAPHY;  Surgeon: Burnell Blanks, MD;  Location: [place];  Service: Cardiovascular;  Laterality: N/A;  . LUMBAR LAMINECTOMY/DECOMPRESSION MICRODISCECTOMY Left 02/12/2013   Procedure: LUMBAR TWO THREE, LUMBAR THREE FOUR, LUMBAR FOUR FIVE  LAMINECTOMY/DECOMPRESSION MICRODISCECTOMY 3 LEVELS;  Surgeon: Charlie Pitter, MD;  Location: [facility];  Service:  Neurosurgery;  Laterality: Left;  . NEPHRECTOMY Right 2010   10.rcc cancer  . TOTAL KNEE ARTHROPLASTY Right    Redo    MEDICATIONS: . acetaminophen (TYLENOL) 500 MG tablet  . aspirin 81 MG chewable tablet  . diclofenac sodium (VOLTAREN) 1 % GEL  . DULoxetine (CYMBALTA) 30 MG capsule  . fluticasone (FLONASE) 50 MCG/ACT nasal spray  . gabapentin (NEURONTIN) 400 MG capsule  . ipratropium (ATROVENT) 0.06 % nasal spray  . metoprolol tartrate (LOPRESSOR) 25 MG tablet  . Multiple Vitamin (MULTIVITAMIN) tablet  . nitroGLYCERIN (NITROSTAT) 0.4 MG SL tablet  . polyethylene glycol powder (GLYCOLAX/MIRALAX) powder  . ranitidine (ZANTAC) 150 MG tablet  . rosuvastatin (CRESTOR) 20 MG tablet  . ticagrelor (BRILINTA) 90 MG TABS tablet  . tizanidine (ZANAFLEX) 2 MG capsule   No current facility-administered medications for this encounter.     [name] [facility]/Anesthesiology Phone [phone] 05/03/2018 11:38 AM

## 2018-05-03 LAB — HEMOGLOBIN A1C
Hgb A1c MFr Bld: 6 % — ABNORMAL HIGH (ref 4.8–5.6)
Mean Plasma Glucose: 126 mg/dL

## 2018-05-03 NOTE — Telephone Encounter (Signed)
   Primary Cardiologist: Dorris Carnes, MD  Chart reviewed as part of pre-operative protocol coverage.   See notes by Dr. Harrington Challenger.  Patient cleared for surgery and to hold antiplatelet drugs.    Pre op Call Back I have sent a letter to the surgeon.  Please make sure it was received. She can hold ASA and Brilinta 5-7 days prior to surgery (She should confirm timing with the surgeon).  Please notify the patient that, per Dr. Harrington Challenger, after her surgery, she will not resume Brilinta.  She will start Plavix 75 mg Once daily after she is cleared to resume antiplatelet medications by the surgeon.   Please send in Rx for Plavix 75 mg QD.  Removing from preop pool. Richardson Dopp, PA-C 05/03/2018, 4:41 PM

## 2018-05-04 MED ORDER — CLOPIDOGREL BISULFATE 75 MG PO TABS: 75.0000 mg | ORAL_TABLET | Freq: Every day | ORAL | 3 refills | Status: DC

## 2018-05-04 NOTE — Telephone Encounter (Addendum)
SPOKE WITH SURGEON OFFICE AND CONFIRMED HAVE RECEIVED  CLEARANCE  SPOKE WITH PT AND PT IS AWARE OF HOLDING ASA AND BIRLINTA 5 TO 7 DAYS BASED UPON  SURGEON INSTRUCTIONS .  ALSO TO START PLAVIX 75 MG ONCE A DAY AFTER PROCEDURE AND SURGEON HAS INSTRUCTED WHEN TO START.

## 2018-05-09 ENCOUNTER — Ambulatory Visit: Payer: Medicare HMO | Admitting: Diagnostic Neuroimaging

## 2018-05-18 ENCOUNTER — Ambulatory Visit (INDEPENDENT_AMBULATORY_CARE_PROVIDER_SITE_OTHER): Payer: Medicare HMO | Admitting: *Deleted

## 2018-05-18 DIAGNOSIS — Z23 Encounter for immunization: Secondary | ICD-10-CM

## 2018-05-25 ENCOUNTER — Encounter (HOSPITAL_COMMUNITY)
Admission: RE | Admit: 2018-05-25 | Discharge: 2018-05-25 | Disposition: A | Discharge status: Home / Self Care | Payer: Medicare HMO | Source: Ambulatory Visit | Attending: Neurosurgery | Admitting: Neurosurgery

## 2018-05-25 ENCOUNTER — Encounter (HOSPITAL_COMMUNITY): Payer: Self-pay

## 2018-05-25 DIAGNOSIS — Z01812 Encounter for preprocedural laboratory examination: Secondary | ICD-10-CM | POA: Insufficient documentation

## 2018-05-25 HISTORY — DX: Dyspnea, unspecified: R06.00

## 2018-05-25 HISTORY — DX: Anxiety disorder, unspecified: F41.9

## 2018-05-25 LAB — CBC
HCT: 45.8 % (ref 36.0–46.0)
Hemoglobin: 14.7 g/dL (ref 12.0–15.0)
MCH: 28.7 pg (ref 26.0–34.0)
MCHC: 32.1 g/dL (ref 30.0–36.0)
MCV: 89.5 fL (ref 80.0–100.0)
Platelets: 212 10*3/uL (ref 150–400)
RBC: 5.12 MIL/uL — ABNORMAL HIGH (ref 3.87–5.11)
RDW: 16.4 % — ABNORMAL HIGH (ref 11.5–15.5)
WBC: 6.4 10*3/uL (ref 4.0–10.5)
nRBC: 0 % (ref 0.0–0.2)

## 2018-05-25 LAB — BASIC METABOLIC PANEL
Anion gap: 5 (ref 5–15)
BUN: 15 mg/dL (ref 8–23)
CO2: 23 mmol/L (ref 22–32)
Calcium: 9.2 mg/dL (ref 8.9–10.3)
Chloride: 110 mmol/L (ref 98–111)
Creatinine, Ser: 1.22 mg/dL — ABNORMAL HIGH (ref 0.44–1.00)
GFR calc Af Amer: 49 mL/min — ABNORMAL LOW (ref >60)
GFR calc non Af Amer: 43 mL/min — ABNORMAL LOW (ref >60)
Glucose, Bld: 96 mg/dL (ref 70–99)
Potassium: 4.2 mmol/L (ref 3.5–5.1)
Sodium: 138 mmol/L (ref 135–145)

## 2018-05-25 MED ORDER — CHLORHEXIDINE GLUCONATE CLOTH 2 % EX PADS: 6.0000 | MEDICATED_PAD | Freq: Once | CUTANEOUS | Status: DC

## 2018-05-25 NOTE — Anesthesia Preprocedure Evaluation (Addendum)
Anesthesia Evaluation  Patient identified by MRN, date of birth, ID band Patient awake    Reviewed: Allergy & Precautions, NPO status , Patient's Chart, lab work & pertinent test results  Airway Mallampati: I  TM Distance: >3 FB Neck ROM: Full    Dental   Pulmonary Current Smoker,    Pulmonary exam normal        Cardiovascular hypertension, Pt. on medications + CAD  Normal cardiovascular exam     Neuro/Psych Anxiety    GI/Hepatic GERD  Medicated and Controlled,  Endo/Other  diabetes, Type 2  Renal/GU Renal InsufficiencyRenal disease     Musculoskeletal   Abdominal   Peds  Hematology   Anesthesia Other Findings   Reproductive/Obstetrics                            Anesthesia Physical Anesthesia Plan  ASA: III  Anesthesia Plan: General   Post-op Pain Management:    Induction: Intravenous  PONV Risk Score and Plan: 2 and Ondansetron and Midazolam  Airway Management Planned: Oral ETT  Additional Equipment:   Intra-op Plan:   Post-operative Plan: Extubation in OR  Informed Consent: I have reviewed the patients History and Physical, chart, labs and discussed the procedure including the risks, benefits and alternatives for the proposed anesthesia with the patient or authorized representative who has indicated his/her understanding and acceptance.     Plan Discussed with: Surgeon and CRNA  Anesthesia Plan Comments: (See PAT note written 05/01/2018 by Karoline Caldwell, PA-C )       Anesthesia Quick Evaluation

## 2018-05-26 ENCOUNTER — Other Ambulatory Visit: Payer: Self-pay | Admitting: Cardiology

## 2018-05-29 ENCOUNTER — Encounter (HOSPITAL_COMMUNITY): Payer: Self-pay | Admitting: *Deleted

## 2018-05-29 ENCOUNTER — Inpatient Hospital Stay (HOSPITAL_COMMUNITY): Payer: Medicare HMO | Admitting: Physician Assistant

## 2018-05-29 ENCOUNTER — Encounter (HOSPITAL_COMMUNITY)
Admission: RE | Disposition: A | Discharge status: Home / Self Care | Payer: Self-pay | Source: Ambulatory Visit | Attending: Neurosurgery

## 2018-05-29 ENCOUNTER — Inpatient Hospital Stay (HOSPITAL_COMMUNITY): Payer: Medicare HMO

## 2018-05-29 ENCOUNTER — Other Ambulatory Visit: Payer: Self-pay

## 2018-05-29 ENCOUNTER — Inpatient Hospital Stay (HOSPITAL_COMMUNITY)
Admission: RE | Admit: 2018-05-29 | Discharge: 2018-05-30 | DRG: 473 | Disposition: A | Discharge status: Home / Self Care | Payer: Medicare HMO | Source: Ambulatory Visit | Attending: Neurosurgery | Admitting: Neurosurgery

## 2018-05-29 ENCOUNTER — Inpatient Hospital Stay (HOSPITAL_COMMUNITY): Payer: Medicare HMO | Admitting: Certified Registered Nurse Anesthetist

## 2018-05-29 DIAGNOSIS — Z419 Encounter for procedure for purposes other than remedying health state, unspecified: Secondary | ICD-10-CM

## 2018-05-29 DIAGNOSIS — I129 Hypertensive chronic kidney disease with stage 1 through stage 4 chronic kidney disease, or unspecified chronic kidney disease: Secondary | ICD-10-CM | POA: Diagnosis present

## 2018-05-29 DIAGNOSIS — Z886 Allergy status to analgesic agent status: Secondary | ICD-10-CM | POA: Diagnosis not present

## 2018-05-29 DIAGNOSIS — M5031 Other cervical disc degeneration,  high cervical region: Secondary | ICD-10-CM | POA: Diagnosis present

## 2018-05-29 DIAGNOSIS — E1140 Type 2 diabetes mellitus with diabetic neuropathy, unspecified: Secondary | ICD-10-CM | POA: Diagnosis present

## 2018-05-29 DIAGNOSIS — M47812 Spondylosis without myelopathy or radiculopathy, cervical region: Secondary | ICD-10-CM | POA: Diagnosis not present

## 2018-05-29 DIAGNOSIS — M4802 Spinal stenosis, cervical region: Secondary | ICD-10-CM | POA: Diagnosis not present

## 2018-05-29 DIAGNOSIS — Z888 Allergy status to other drugs, medicaments and biological substances status: Secondary | ICD-10-CM

## 2018-05-29 DIAGNOSIS — Z96651 Presence of right artificial knee joint: Secondary | ICD-10-CM | POA: Diagnosis present

## 2018-05-29 DIAGNOSIS — G959 Disease of spinal cord, unspecified: Secondary | ICD-10-CM | POA: Diagnosis present

## 2018-05-29 DIAGNOSIS — I2510 Atherosclerotic heart disease of native coronary artery without angina pectoris: Secondary | ICD-10-CM | POA: Diagnosis present

## 2018-05-29 DIAGNOSIS — Z885 Allergy status to narcotic agent status: Secondary | ICD-10-CM

## 2018-05-29 DIAGNOSIS — M4712 Other spondylosis with myelopathy, cervical region: Principal | ICD-10-CM | POA: Diagnosis present

## 2018-05-29 DIAGNOSIS — Z905 Acquired absence of kidney: Secondary | ICD-10-CM | POA: Diagnosis not present

## 2018-05-29 DIAGNOSIS — Z79899 Other long term (current) drug therapy: Secondary | ICD-10-CM

## 2018-05-29 DIAGNOSIS — N183 Chronic kidney disease, stage 3 (moderate): Secondary | ICD-10-CM | POA: Diagnosis not present

## 2018-05-29 DIAGNOSIS — Z7902 Long term (current) use of antithrombotics/antiplatelets: Secondary | ICD-10-CM | POA: Diagnosis not present

## 2018-05-29 DIAGNOSIS — Z85528 Personal history of other malignant neoplasm of kidney: Secondary | ICD-10-CM

## 2018-05-29 DIAGNOSIS — E1122 Type 2 diabetes mellitus with diabetic chronic kidney disease: Secondary | ICD-10-CM | POA: Diagnosis not present

## 2018-05-29 DIAGNOSIS — M069 Rheumatoid arthritis, unspecified: Secondary | ICD-10-CM | POA: Diagnosis present

## 2018-05-29 DIAGNOSIS — K219 Gastro-esophageal reflux disease without esophagitis: Secondary | ICD-10-CM | POA: Diagnosis present

## 2018-05-29 DIAGNOSIS — Z7982 Long term (current) use of aspirin: Secondary | ICD-10-CM | POA: Diagnosis not present

## 2018-05-29 DIAGNOSIS — Z955 Presence of coronary angioplasty implant and graft: Secondary | ICD-10-CM | POA: Diagnosis not present

## 2018-05-29 DIAGNOSIS — Z981 Arthrodesis status: Secondary | ICD-10-CM | POA: Diagnosis not present

## 2018-05-29 DIAGNOSIS — F17210 Nicotine dependence, cigarettes, uncomplicated: Secondary | ICD-10-CM | POA: Diagnosis present

## 2018-05-29 HISTORY — PX: ANTERIOR CERVICAL DECOMP/DISCECTOMY FUSION: SHX1161

## 2018-05-29 LAB — GLUCOSE, CAPILLARY
Glucose-Capillary: 90 mg/dL (ref 70–99)
Glucose-Capillary: 107 mg/dL — ABNORMAL HIGH (ref 70–99)
Glucose-Capillary: 116 mg/dL — ABNORMAL HIGH (ref 70–99)
Glucose-Capillary: 147 mg/dL — ABNORMAL HIGH (ref 70–99)
Glucose-Capillary: 130 mg/dL — ABNORMAL HIGH (ref 70–99)

## 2018-05-29 LAB — TYPE AND SCREEN
ABO/RH(D): A POS
Antibody Screen: POSITIVE

## 2018-05-29 SURGERY — ANTERIOR CERVICAL DECOMPRESSION/DISCECTOMY FUSION 2 LEVELS
Anesthesia: General | Site: Spine Cervical

## 2018-05-29 MED ORDER — GABAPENTIN 400 MG PO CAPS
400.0000 mg | ORAL_CAPSULE | Freq: Every day | ORAL | Status: DC
  Administered 2018-05-29: 400 mg via ORAL
  Filled 2018-05-29: qty 1

## 2018-05-29 MED ORDER — ONDANSETRON HCL 4 MG/2ML IJ SOLN: 4.0000 mg | Freq: Four times a day (QID) | INTRAMUSCULAR | Status: DC | PRN

## 2018-05-29 MED ORDER — ACETAMINOPHEN 325 MG PO TABS: 650.0000 mg | ORAL_TABLET | ORAL | Status: DC | PRN

## 2018-05-29 MED ORDER — HEMOSTATIC AGENTS (NO CHARGE) OPTIME
TOPICAL | Status: DC | PRN
  Administered 2018-05-29 (×2): 1 via TOPICAL

## 2018-05-29 MED ORDER — CEFAZOLIN SODIUM-DEXTROSE 1-4 GM/50ML-% IV SOLN
1.0000 g | Freq: Three times a day (TID) | INTRAVENOUS | Status: AC
  Administered 2018-05-29 (×2): 1 g via INTRAVENOUS
  Filled 2018-05-29 (×2): qty 50

## 2018-05-29 MED ORDER — ACETAMINOPHEN 650 MG RE SUPP: 650.0000 mg | RECTAL | Status: DC | PRN

## 2018-05-29 MED ORDER — CYCLOBENZAPRINE HCL 10 MG PO TABS
ORAL_TABLET | ORAL | Status: AC
  Filled 2018-05-29: qty 1

## 2018-05-29 MED ORDER — ONDANSETRON HCL 4 MG PO TABS: 4.0000 mg | ORAL_TABLET | Freq: Four times a day (QID) | ORAL | Status: DC | PRN

## 2018-05-29 MED ORDER — ONDANSETRON HCL 4 MG/2ML IJ SOLN
INTRAMUSCULAR | Status: DC | PRN
  Administered 2018-05-29: 4 mg via INTRAVENOUS

## 2018-05-29 MED ORDER — THROMBIN 5000 UNITS EX SOLR
CUTANEOUS | Status: AC
  Filled 2018-05-29: qty 15000

## 2018-05-29 MED ORDER — LIDOCAINE 2% (20 MG/ML) 5 ML SYRINGE
INTRAMUSCULAR | Status: DC | PRN
  Administered 2018-05-29: 100 mg via INTRAVENOUS

## 2018-05-29 MED ORDER — HYDROCODONE-ACETAMINOPHEN 5-325 MG PO TABS: 1.0000 | ORAL_TABLET | ORAL | Status: DC | PRN

## 2018-05-29 MED ORDER — ONDANSETRON HCL 4 MG/2ML IJ SOLN: 4.0000 mg | Freq: Once | INTRAMUSCULAR | Status: DC | PRN

## 2018-05-29 MED ORDER — SUGAMMADEX SODIUM 200 MG/2ML IV SOLN
INTRAVENOUS | Status: DC | PRN
  Administered 2018-05-29: 200 mg via INTRAVENOUS
  Administered 2018-05-29: 100 mg via INTRAVENOUS

## 2018-05-29 MED ORDER — HYDROMORPHONE HCL 1 MG/ML IJ SOLN: 1.0000 mg | INTRAMUSCULAR | Status: DC | PRN

## 2018-05-29 MED ORDER — SODIUM CHLORIDE 0.9% FLUSH: 3.0000 mL | Freq: Two times a day (BID) | INTRAVENOUS | Status: DC

## 2018-05-29 MED ORDER — MENTHOL 3 MG MT LOZG
1.0000 | LOZENGE | OROMUCOSAL | Status: DC | PRN
  Administered 2018-05-30: 3 mg via ORAL
  Filled 2018-05-29: qty 9

## 2018-05-29 MED ORDER — PHENOL 1.4 % MT LIQD: 1.0000 | OROMUCOSAL | Status: DC | PRN

## 2018-05-29 MED ORDER — HYDROMORPHONE HCL 1 MG/ML IJ SOLN
INTRAMUSCULAR | Status: AC
  Administered 2018-05-29: 0.5 mg via INTRAVENOUS
  Filled 2018-05-29: qty 1

## 2018-05-29 MED ORDER — LACTATED RINGERS IV SOLN
INTRAVENOUS | Status: DC
  Administered 2018-05-29: 07:00:00 via INTRAVENOUS

## 2018-05-29 MED ORDER — ROSUVASTATIN CALCIUM 20 MG PO TABS
20.0000 mg | ORAL_TABLET | Freq: Every day | ORAL | Status: DC
  Administered 2018-05-29: 20 mg via ORAL
  Filled 2018-05-29: qty 1

## 2018-05-29 MED ORDER — FENTANYL CITRATE (PF) 250 MCG/5ML IJ SOLN
INTRAMUSCULAR | Status: DC | PRN
  Administered 2018-05-29 (×2): 50 ug via INTRAVENOUS
  Administered 2018-05-29: 150 ug via INTRAVENOUS

## 2018-05-29 MED ORDER — SODIUM CHLORIDE 0.9 % IV SOLN: 250.0000 mL | INTRAVENOUS | Status: DC

## 2018-05-29 MED ORDER — PROPOFOL 10 MG/ML IV BOLUS
INTRAVENOUS | Status: AC
  Filled 2018-05-29: qty 20

## 2018-05-29 MED ORDER — DEXAMETHASONE SODIUM PHOSPHATE 10 MG/ML IJ SOLN
INTRAMUSCULAR | Status: DC | PRN
  Administered 2018-05-29: 10 mg via INTRAVENOUS

## 2018-05-29 MED ORDER — SODIUM CHLORIDE 0.9 % IV SOLN
INTRAVENOUS | Status: DC | PRN
  Administered 2018-05-29: 500 mL

## 2018-05-29 MED ORDER — MEPERIDINE HCL 50 MG/ML IJ SOLN: 6.2500 mg | INTRAMUSCULAR | Status: DC | PRN

## 2018-05-29 MED ORDER — ADULT MULTIVITAMIN W/MINERALS CH
1.0000 | ORAL_TABLET | Freq: Every day | ORAL | Status: DC
  Administered 2018-05-30: 1 via ORAL
  Filled 2018-05-29 (×2): qty 1

## 2018-05-29 MED ORDER — ACETAMINOPHEN 10 MG/ML IV SOLN
INTRAVENOUS | Status: DC | PRN
  Administered 2018-05-29: 1000 mg via INTRAVENOUS

## 2018-05-29 MED ORDER — ACETAMINOPHEN 10 MG/ML IV SOLN
INTRAVENOUS | Status: AC
  Filled 2018-05-29: qty 100

## 2018-05-29 MED ORDER — FLUTICASONE PROPIONATE 50 MCG/ACT NA SUSP
2.0000 | Freq: Every evening | NASAL | Status: DC | PRN
  Filled 2018-05-29: qty 16

## 2018-05-29 MED ORDER — FAMOTIDINE 20 MG PO TABS
20.0000 mg | ORAL_TABLET | Freq: Two times a day (BID) | ORAL | Status: DC
  Administered 2018-05-29 – 2018-05-30 (×2): 20 mg via ORAL
  Filled 2018-05-29 (×2): qty 1

## 2018-05-29 MED ORDER — HYDROMORPHONE HCL 1 MG/ML IJ SOLN
0.2500 mg | INTRAMUSCULAR | Status: DC | PRN
  Administered 2018-05-29: 0.5 mg via INTRAVENOUS

## 2018-05-29 MED ORDER — POLYETHYLENE GLYCOL 3350 17 G PO PACK: 17.0000 g | PACK | ORAL | Status: DC

## 2018-05-29 MED ORDER — FENTANYL CITRATE (PF) 250 MCG/5ML IJ SOLN
INTRAMUSCULAR | Status: AC
  Filled 2018-05-29: qty 5

## 2018-05-29 MED ORDER — CYCLOBENZAPRINE HCL 10 MG PO TABS
10.0000 mg | ORAL_TABLET | Freq: Three times a day (TID) | ORAL | Status: DC | PRN
  Administered 2018-05-29 – 2018-05-30 (×3): 10 mg via ORAL
  Filled 2018-05-29 (×2): qty 1

## 2018-05-29 MED ORDER — THROMBIN 5000 UNITS EX SOLR
CUTANEOUS | Status: DC | PRN
  Administered 2018-05-29 (×4): 5000 [IU] via TOPICAL

## 2018-05-29 MED ORDER — NITROGLYCERIN 0.4 MG SL SUBL: 0.4000 mg | SUBLINGUAL_TABLET | SUBLINGUAL | Status: DC | PRN

## 2018-05-29 MED ORDER — SODIUM CHLORIDE 0.9% FLUSH: 3.0000 mL | INTRAVENOUS | Status: DC | PRN

## 2018-05-29 MED ORDER — THROMBIN 5000 UNITS EX SOLR
OROMUCOSAL | Status: DC | PRN
  Administered 2018-05-29: 5 mL via TOPICAL

## 2018-05-29 MED ORDER — SODIUM CHLORIDE 0.9 % IV SOLN
INTRAVENOUS | Status: DC | PRN
  Administered 2018-05-29: 20 ug/min via INTRAVENOUS

## 2018-05-29 MED ORDER — ROCURONIUM BROMIDE 10 MG/ML (PF) SYRINGE
PREFILLED_SYRINGE | INTRAVENOUS | Status: DC | PRN
  Administered 2018-05-29: 50 mg via INTRAVENOUS
  Administered 2018-05-29 (×2): 10 mg via INTRAVENOUS

## 2018-05-29 MED ORDER — PROPOFOL 10 MG/ML IV BOLUS
INTRAVENOUS | Status: DC | PRN
  Administered 2018-05-29: 120 mg via INTRAVENOUS

## 2018-05-29 MED ORDER — MIDAZOLAM HCL 2 MG/2ML IJ SOLN
INTRAMUSCULAR | Status: AC
  Filled 2018-05-29: qty 2

## 2018-05-29 MED ORDER — METOPROLOL TARTRATE 25 MG PO TABS
25.0000 mg | ORAL_TABLET | Freq: Two times a day (BID) | ORAL | Status: DC
  Administered 2018-05-29 – 2018-05-30 (×2): 25 mg via ORAL
  Filled 2018-05-29 (×2): qty 1

## 2018-05-29 MED ORDER — HYDROCODONE-ACETAMINOPHEN 10-325 MG PO TABS
2.0000 | ORAL_TABLET | ORAL | Status: DC | PRN
  Administered 2018-05-29 – 2018-05-30 (×4): 2 via ORAL
  Filled 2018-05-29 (×4): qty 2

## 2018-05-29 MED ORDER — TICAGRELOR 90 MG PO TABS
90.0000 mg | ORAL_TABLET | Freq: Two times a day (BID) | ORAL | Status: DC
  Administered 2018-05-29 – 2018-05-30 (×2): 90 mg via ORAL
  Filled 2018-05-29 (×3): qty 1

## 2018-05-29 MED ORDER — INSULIN ASPART 100 UNIT/ML ~~LOC~~ SOLN
0.0000 [IU] | Freq: Three times a day (TID) | SUBCUTANEOUS | Status: DC
  Administered 2018-05-29: 3 [IU] via SUBCUTANEOUS

## 2018-05-29 MED ORDER — 0.9 % SODIUM CHLORIDE (POUR BTL) OPTIME
TOPICAL | Status: DC | PRN
  Administered 2018-05-29: 1000 mL

## 2018-05-29 MED ORDER — THROMBIN 5000 UNITS EX SOLR
CUTANEOUS | Status: AC
  Filled 2018-05-29: qty 10000

## 2018-05-29 MED ORDER — CEFAZOLIN SODIUM-DEXTROSE 2-4 GM/100ML-% IV SOLN
2.0000 g | INTRAVENOUS | Status: AC
  Administered 2018-05-29: 2 g via INTRAVENOUS
  Filled 2018-05-29: qty 100

## 2018-05-29 SURGICAL SUPPLY — 59 items
BAG DECANTER FOR FLEXI CONT (MISCELLANEOUS) ×2 IMPLANT
BENZOIN TINCTURE PRP APPL 2/3 (GAUZE/BANDAGES/DRESSINGS) ×2 IMPLANT
BIT DRILL 13 (BIT) ×2 IMPLANT
BUR MATCHSTICK NEURO 3.0 LAGG (BURR) ×2 IMPLANT
CANISTER SUCT 3000ML PPV (MISCELLANEOUS) ×2 IMPLANT
CARTRIDGE OIL MAESTRO DRILL (MISCELLANEOUS) ×1 IMPLANT
CLSR STERI-STRIP ANTIMIC 1/2X4 (GAUZE/BANDAGES/DRESSINGS) ×2 IMPLANT
COVER WAND RF STERILE (DRAPES) ×2 IMPLANT
DIFFUSER DRILL AIR PNEUMATIC (MISCELLANEOUS) ×2 IMPLANT
DRAPE C-ARM 42X72 X-RAY (DRAPES) ×4 IMPLANT
DRAPE LAPAROTOMY 100X72 PEDS (DRAPES) ×2 IMPLANT
DRAPE MICROSCOPE LEICA (MISCELLANEOUS) ×2 IMPLANT
DURAPREP 6ML APPLICATOR 50/CS (WOUND CARE) ×2 IMPLANT
ELECT COATED BLADE 2.86 ST (ELECTRODE) ×2 IMPLANT
ELECT REM PT RETURN 9FT ADLT (ELECTROSURGICAL) ×2
ELECTRODE REM PT RTRN 9FT ADLT (ELECTROSURGICAL) ×1 IMPLANT
GAUZE 4X4 16PLY RFD (DISPOSABLE) IMPLANT
GAUZE SPONGE 4X4 12PLY STRL (GAUZE/BANDAGES/DRESSINGS) IMPLANT
GAUZE SPONGE 4X4 12PLY STRL LF (GAUZE/BANDAGES/DRESSINGS) ×2 IMPLANT
GLOVE BIOGEL PI IND STRL 7.0 (GLOVE) ×1 IMPLANT
GLOVE BIOGEL PI IND STRL 7.5 (GLOVE) ×2 IMPLANT
GLOVE BIOGEL PI INDICATOR 7.0 (GLOVE) ×1
GLOVE BIOGEL PI INDICATOR 7.5 (GLOVE) ×2
GLOVE ECLIPSE 9.0 STRL (GLOVE) ×2 IMPLANT
GLOVE EXAM NITRILE LRG STRL (GLOVE) IMPLANT
GLOVE EXAM NITRILE XL STR (GLOVE) IMPLANT
GLOVE EXAM NITRILE XS STR PU (GLOVE) IMPLANT
GLOVE SURG SS PI 7.5 STRL IVOR (GLOVE) ×8 IMPLANT
GOWN STRL REUS W/ TWL LRG LVL3 (GOWN DISPOSABLE) ×3 IMPLANT
GOWN STRL REUS W/ TWL XL LVL3 (GOWN DISPOSABLE) ×1 IMPLANT
GOWN STRL REUS W/TWL 2XL LVL3 (GOWN DISPOSABLE) IMPLANT
GOWN STRL REUS W/TWL LRG LVL3 (GOWN DISPOSABLE) ×3
GOWN STRL REUS W/TWL XL LVL3 (GOWN DISPOSABLE) ×1
HALTER HD/CHIN CERV TRACTION D (MISCELLANEOUS) ×2 IMPLANT
HEMOSTAT POWDER KIT SURGIFOAM (HEMOSTASIS) ×2 IMPLANT
HEMOSTAT SURGICEL 2X14 (HEMOSTASIS) IMPLANT
KIT BASIN OR (CUSTOM PROCEDURE TRAY) ×2 IMPLANT
KIT TURNOVER KIT B (KITS) ×2 IMPLANT
NEEDLE SPNL 20GX3.5 QUINCKE YW (NEEDLE) ×2 IMPLANT
NS IRRIG 1000ML POUR BTL (IV SOLUTION) ×2 IMPLANT
OIL CARTRIDGE MAESTRO DRILL (MISCELLANEOUS) ×2
PACK LAMINECTOMY NEURO (CUSTOM PROCEDURE TRAY) ×2 IMPLANT
PAD ARMBOARD 7.5X6 YLW CONV (MISCELLANEOUS) ×6 IMPLANT
PLATE ELITE 42MM (Plate) ×2 IMPLANT
RUBBERBAND STERILE (MISCELLANEOUS) ×4 IMPLANT
SCREW ST 13X4XST VA NS SPNE (Screw) ×6 IMPLANT
SCREW ST VAR 4 ATL (Screw) ×6 IMPLANT
SPACER BONE CORNERSTONE 6X14 (Orthopedic Implant) ×4 IMPLANT
SPONGE INTESTINAL PEANUT (DISPOSABLE) ×2 IMPLANT
SPONGE SURGIFOAM ABS GEL SZ50 (HEMOSTASIS) ×4 IMPLANT
STRIP CLOSURE SKIN 1/2X4 (GAUZE/BANDAGES/DRESSINGS) ×2 IMPLANT
SUT VIC AB 3-0 SH 8-18 (SUTURE) ×2 IMPLANT
SUT VIC AB 4-0 RB1 18 (SUTURE) ×2 IMPLANT
TAPE CLOTH 4X10 WHT NS (GAUZE/BANDAGES/DRESSINGS) ×2 IMPLANT
TAPE CLOTH SURG 4X10 WHT LF (GAUZE/BANDAGES/DRESSINGS) ×2 IMPLANT
TOWEL GREEN STERILE (TOWEL DISPOSABLE) ×2 IMPLANT
TOWEL GREEN STERILE FF (TOWEL DISPOSABLE) ×2 IMPLANT
TRAP SPECIMEN MUCOUS 40CC (MISCELLANEOUS) ×2 IMPLANT
WATER STERILE IRR 1000ML POUR (IV SOLUTION) ×2 IMPLANT

## 2018-05-29 NOTE — Transfer of Care (Signed)
Immediate Anesthesia Transfer of Care Note  Patient: [name]  Procedure(s) Performed: ANTERIOR CERVICAL DECOMPRESSION FUSION - CERVICAL FIVE-CERVICAL SIX - CERVICAL SIX-CERVICAL SEVEN (N/A Spine Cervical)  Patient Location: PACU  Anesthesia Type:General  Level of Consciousness: awake, alert , oriented and patient cooperative  Airway & Oxygen Therapy: Patient Spontanous Breathing and Patient connected to nasal cannula oxygen  Post-op Assessment: Report given to RN, Post -op Vital signs reviewed and stable and Patient moving all extremities X 4  Post vital signs: Reviewed and stable  Last Vitals:  Vitals Value Taken Time  BP 176/82 05/29/2018 10:20 AM  Temp    Pulse 61 05/29/2018 10:23 AM  Resp 10 05/29/2018 10:23 AM  SpO2 93 % 05/29/2018 10:23 AM  Vitals shown include unvalidated device data.  Last Pain:  Vitals:   05/29/18 0712  TempSrc:   PainSc: 6       Patients Stated Pain Goal: 4 (92/11/94 1740)  Complications: No apparent anesthesia complications

## 2018-05-29 NOTE — Progress Notes (Signed)
Dr. Conrad Omer aware of BP

## 2018-05-29 NOTE — Brief Op Note (Signed)
05/29/2018  10:07 AM  PATIENT:  [name]  72 y.o. female  PRE-OPERATIVE DIAGNOSIS:  Stenosis  POST-OPERATIVE DIAGNOSIS:  Stenosis  PROCEDURE:  Procedure(s): ANTERIOR CERVICAL DECOMPRESSION FUSION - CERVICAL FIVE-CERVICAL SIX - CERVICAL SIX-CERVICAL SEVEN (N/A)  SURGEON:  Surgeon(s) and Role:    * Darcel Frane, Mallie Mussel, MD - Primary    * Ostergard, Joyice Faster, MD - Assisting  PHYSICIAN ASSISTANT:   ASSISTANTS:    ANESTHESIA:   general  EBL:  100 mL   BLOOD ADMINISTERED:none  DRAINS: none   LOCAL MEDICATIONS USED:  NONE  SPECIMEN:  No Specimen  DISPOSITION OF SPECIMEN:  N/A  COUNTS:  YES  TOURNIQUET:  * No tourniquets in log *  DICTATION: .Dragon Dictation  PLAN OF CARE: Admit to inpatient   PATIENT DISPOSITION:  PACU - hemodynamically stable.   Delay start of Pharmacological VTE agent (>24hrs) due to surgical blood loss or risk of bleeding: yes

## 2018-05-29 NOTE — H&P (Signed)
[name] is an 72 y.o. female.   Chief Complaint: Neck and left upper extremity pain. HPI: 72 year old female with progressive neck pain with radiation into her upper extremities left greater than right.  Patient with decreased dexterity and fine motor control in both hands.  Beginning to have some difficulty with lower extremity function and walking.  Patient underwent MRI scanning of her cervical spine which demonstrates evidence of marked disc degeneration with associated central disc protrusions and severe spinal stenosis at C5-6 and C6-7.  Patient presents now for 2 level anterior cervical decompression and fusion in hopes of improving her symptoms.  Past Medical History:  Diagnosis Date  . Allergy   . Anxiety   . Arthritis    RA (Dr. Ouida Sills) Bilateral hands  . C. difficile diarrhea    history of  . CAD (coronary artery disease), native coronary artery    a. Cardiac CT with + FFR for Ramus b. cath 05/2017 100% sub acute lesion to mid circumflex artery s/p DES.  . Cancer ([place])    Right kidney CA removed.   . CKD (chronic kidney disease) stage 3, GFR 30-59 ml/min ([place]) 10/11/2016   s/p R nephrectomy  . Colon polyps 2008   HYPERPLASTIC  . Costochondritis    a. Nuc Stress Test 6/16: EF 70, no scar or ischemia, low risk // b. Echo 3/16: mild conc LVH, EF 65-70, no RWMA, Gr 1 DD, mild TR  . Diabetes mellitus without complication ([place])    type 2  no meds  . Dyspnea   . Gastritis   . GERD (gastroesophageal reflux disease)   . History of echocardiogram    Echo 3/18:  Moderate LVH, EF 95-28, grade 1 diastolic dysfunction, calcified aortic valve, mild MR, moderate LAE  . History of nuclear stress test    Myoview 3/18: Mod size and intensity fixed septal defect, may be artifact.Opposite mod size and intensity lat defect, which is reversible and could represent ischemia or possibly artifact (SDS 4). LVEF 71% with normal wall motion. Intermediate risk study. >> images reviewed with Dr.  Dorris Carnes - no sig ischemia; med rx   . History of pneumonia   . Hx of cardiovascular stress test    Lexiscan Myoview 6/16:  EF 70%, no scar or ischemia; Low Risk  . Hypertension   . Neuromuscular disorder ([place])    Neruopathy in bilateral feet  . Neuropathy   . Orthostatic hypotension 05/28/2017  . Osteoarthritis   . Pneumonia   . S/P angioplasty with stent 05/27/17 to LCX with DES  05/28/2017  . Thyroid disease     Past Surgical History:  Procedure Laterality Date  . ABDOMINAL HYSTERECTOMY  1978  . BACK SURGERY     x 2  . CARPAL TUNNEL RELEASE Left   . COLONOSCOPY W/ BIOPSIES AND POLYPECTOMY     Hx: of  . CORONARY STENT INTERVENTION N/A 05/27/2017   Procedure: CORONARY STENT INTERVENTION;  Surgeon: Burnell Blanks, MD;  Location: [facility];  Service: Cardiovascular;  Laterality: N/A;  . ESOPHAGOGASTRODUODENOSCOPY    . HNP    . LEFT HEART CATH AND CORONARY ANGIOGRAPHY N/A 05/27/2017   Procedure: LEFT HEART CATH AND CORONARY ANGIOGRAPHY;  Surgeon: Burnell Blanks, MD;  Location: [facility];  Service: Cardiovascular;  Laterality: N/A;  . LUMBAR LAMINECTOMY/DECOMPRESSION MICRODISCECTOMY Left 02/12/2013   Procedure: LUMBAR TWO THREE, LUMBAR THREE FOUR, LUMBAR FOUR FIVE  LAMINECTOMY/DECOMPRESSION MICRODISCECTOMY 3 LEVELS;  Surgeon: Charlie Pitter,  MD;  Location: [facility];  Service: Neurosurgery;  Laterality: Left;  . NEPHRECTOMY Right 2010   10.rcc cancer  . TOTAL KNEE ARTHROPLASTY Right    Redo    Family History  Problem Relation Age of Onset  . Rheum arthritis Mother   . Stroke Mother   . Prostate cancer Father   . Heart disease Father   . Diabetes Brother   . Dementia Brother   . Parkinsonism Brother   . Kidney disease Son   . Diabetes Brother   . Cancer Brother   . Dementia Brother   . Colon cancer Neg Hx   . Esophageal cancer Neg Hx   . Pancreatic cancer Neg Hx   . Liver disease Neg Hx    Social History:  reports that she has  been smoking cigarettes. She has a 7.50 pack-year smoking history. She has never used smokeless tobacco. She reports that she does not drink alcohol or use drugs.  Allergies:  Allergies  Allergen Reactions  . Percocet [Oxycodone-Acetaminophen] Hives, Itching and Other (See Comments)    hallucinations  . Pravachol [Pravastatin Sodium] Other (See Comments)    Muscle aches  . Hydrocodone Other (See Comments)    "crazy dreams"  . Aspirin Other (See Comments)    GI upset    Medications Prior to Admission  Medication Sig Dispense Refill  . acetaminophen (TYLENOL) 500 MG tablet Take 1,000 mg by mouth every 6 (six) hours as needed for moderate pain or headache.     [name] Kitchen aspirin 81 MG chewable tablet Chew 1 tablet (81 mg total) by mouth daily.    . diclofenac sodium (VOLTAREN) 1 % GEL Apply 2 g topically 3 (three) times daily as needed (for arm pain).    . fluticasone (FLONASE) 50 MCG/ACT nasal spray Place 1 spray into both nostrils 2 (two) times daily as needed for allergies. (Patient taking differently: Place 2 sprays into both nostrils at bedtime as needed for allergies. ) 32 g 7  . gabapentin (NEURONTIN) 400 MG capsule Take 1 capsule (400 mg total) by mouth 2 (two) times daily. (Patient taking differently: Take 400 mg by mouth at bedtime. ) 60 capsule 12  . metoprolol tartrate (LOPRESSOR) 25 MG tablet Take 1 tablet (25 mg total) by mouth 2 (two) times daily. 180 tablet 3  . Multiple Vitamin (MULTIVITAMIN) tablet Take 1 tablet by mouth daily.    . nitroGLYCERIN (NITROSTAT) 0.4 MG SL tablet Place 1 tablet (0.4 mg total) under the tongue every 5 (five) minutes as needed for chest pain. 30 tablet 6  . polyethylene glycol powder (GLYCOLAX/MIRALAX) powder Take 17 g by mouth daily. (Patient taking differently: Take 17 g by mouth every other day. ) 500 g 3  . ranitidine (ZANTAC) 150 MG tablet TAKE 1 TABLET BY MOUTH TWICE A DAY (Patient taking differently: Take 150 mg by mouth 2 (two) times daily. ) 180  tablet 2  . rosuvastatin (CRESTOR) 20 MG tablet Take 1 tablet (20 mg total) by mouth daily at 6 PM. 90 tablet 3  . BRILINTA 90 MG TABS tablet TAKE 1 TABLET BY MOUTH 2 TIMES A DAY 60 tablet 5  . clopidogrel (PLAVIX) 75 MG tablet Take 1 tablet (75 mg total) by mouth daily. 30 tablet 3  . DULoxetine (CYMBALTA) 30 MG capsule Take 1 capsule (30 mg total) by mouth daily. (Patient not taking: Reported on 04/24/2018) 30 capsule 1  . ipratropium (ATROVENT) 0.06 % nasal spray Place 2 sprays into both nostrils  4 (four) times daily. (Patient not taking: Reported on 04/24/2018) 15 mL 2  . tizanidine (ZANAFLEX) 2 MG capsule Take 1 capsule (2 mg total) by mouth 3 (three) times daily. (Patient not taking: Reported on 04/24/2018) 135 capsule 1    Results for orders placed or performed during the hospital encounter of 05/29/18 (from the past 48 hour(s))  Glucose, capillary     Status: None   Collection Time: 05/29/18  6:21 AM  Result Value Ref Range   Glucose-Capillary 90 70 - 99 mg/dL   No results found.  Pertinent items noted in HPI and remainder of comprehensive ROS otherwise negative.  Blood pressure (!) 185/96, pulse 66, temperature 97.9 F (36.6 C), temperature source Oral, resp. rate 20, SpO2 96 %.  Patient is awake and alert.  She is oriented and appropriate.  Cranial nerve function is intact.  Speech is fluent.  Judgment and insight are intact.  Motor examination of her extremities reveals some mild weakness of her left wrist extensors and left triceps muscles.  She has decreased grip strength and intrinsic strength bilaterally.  Lower extremity strength is mildly reduced with some spasticity.  Reflexes are brisk in both lower extremities.  There are normal in both upper extremities.  She has Hoffmann's responses in her left hand.  Examination head ears eyes and throat is unremarkable her chest and abdomen are benign. Assessment/Plan Cervical stenosis with myelopathy.  Plan C5-6, C6-7 anterior cervical  discectomy and fusion with interbody peek cage, locally harvested autograft, and anterior plate instrumentation.  Risks and benefits been explained.  Patient wishes to proceed.  Mallie Mussel A Kristina Mcnorton 05/29/2018, 7:55 AM

## 2018-05-29 NOTE — Plan of Care (Signed)
  Problem: Education: Goal: Ability to verbalize activity precautions or restrictions will improve Outcome: Progressing Goal: Knowledge of the prescribed therapeutic regimen will improve Outcome: Progressing Goal: Understanding of discharge needs will improve Outcome: Progressing   

## 2018-05-29 NOTE — Anesthesia Procedure Notes (Signed)
Procedure Name: Intubation Date/Time: 05/29/2018 8:34 AM Performed by: Julieta Bellini, CRNA Pre-anesthesia Checklist: Patient identified, Emergency Drugs available, Suction available and Patient being monitored Patient Re-evaluated:Patient Re-evaluated prior to induction Oxygen Delivery Method: Circle system utilized Preoxygenation: Pre-oxygenation with 100% oxygen Induction Type: IV induction Ventilation: Mask ventilation without difficulty Laryngoscope Size: Glidescope and 3 Grade View: Grade I Tube type: Oral Tube size: 7.0 mm Number of attempts: 1 Airway Equipment and Method: Video-laryngoscopy and Rigid stylet Placement Confirmation: ETT inserted through vocal cords under direct vision,  positive ETCO2 and breath sounds checked- equal and bilateral Secured at: 21 cm Tube secured with: Tape Dental Injury: Teeth and Oropharynx as per pre-operative assessment

## 2018-05-29 NOTE — Evaluation (Signed)
Occupational Therapy Evaluation Patient Details Name: [name] MRN: [id number] DOB: 1946-05-24 Today's Date: 05/29/2018    History of Present Illness Pt is a 72 y/o female s/p C5-6 C6-7 ACDF.  PMH signficant for but not limited to: anxiety, arthritis, HTN, phenuomia, DM type 2, lumbar sx.    Clinical Impression   PTA independent and driving.  Currently admitted for above and limited by generalized weakness, impaired balance, precautions, decreased sensation and edema in LUE, and decreased functional reach to B feet. Anticipate unsteadiness is from lethargy, requires min guard assist for safety and balance for mobility and transfers. Patient requires min assist for UB dressing, min assist for LB ADLs, and supervision for grooming.  Will continued to follow while admitted in order to optimize independence with ADLs and mobility. Anticipate no further needs after dc.     Follow Up Recommendations  No OT follow up;Supervision - Intermittent    Equipment Recommendations  None recommended by OT    Recommendations for Other Services       Precautions / Restrictions Precautions Precautions: Cervical Precaution Booklet Issued: Yes (comment) Required Braces or Orthoses: Cervical Brace Cervical Brace: Soft collar;At all times Restrictions Weight Bearing Restrictions: No      Mobility Bed Mobility Overal bed mobility: Needs Assistance Bed Mobility: Rolling;Sidelying to Sit;Sit to Sidelying Rolling: Supervision Sidelying to sit: Supervision     Sit to sidelying: Supervision General bed mobility comments: supervision for safety and correct technique  Transfers Overall transfer level: Needs assistance Equipment used: 1 person hand held assist Transfers: Sit to/from Stand Sit to Stand: Min guard         General transfer comment: min guard for safety and balance    Balance Overall balance assessment: Mild deficits observed, not formally tested                                          ADL either performed or assessed with clinical judgement   ADL Overall ADL's : Needs assistance/impaired     Grooming: Supervision/safety;Standing   Upper Body Bathing: Supervision/ safety;Sitting   Lower Body Bathing: Minimal assistance;Sit to/from stand;Cueing for compensatory techniques Lower Body Bathing Details (indicate cue type and reason): patient able to complete figure 4 technique but difficutly reaching feet Upper Body Dressing : Minimal assistance;Sitting Upper Body Dressing Details (indicate cue type and reason): reviewed compensatory techniques but requires a little assist  Lower Body Dressing: Minimal assistance;Sit to/from stand;Cueing for compensatory techniques Lower Body Dressing Details (indicate cue type and reason): reviewed compensatory techniques, able to complete figure 4 but continues to demonstrate difficulty donning socks Toilet Transfer: Ambulation;Min guard(simulated in room, hand held assist)           Functional mobility during ADLs: Min guard(hand held assist) General ADL Comments: educated on precautions, safety, body mechanics and compensatory techniques     Vision Baseline Vision/History: Wears glasses Patient Visual Report: No change from baseline Vision Assessment?: No apparent visual deficits     Perception     Praxis      Pertinent Vitals/Pain Pain Assessment: No/denies pain     Hand Dominance Right   Extremity/Trunk Assessment Upper Extremity Assessment Upper Extremity Assessment: RUE deficits/detail;LUE deficits/detail RUE Deficits / Details: 4/5 MMT grossly within precautions (less than 90 FF)  RUE Sensation: WNL RUE Coordination: WNL LUE Deficits / Details: 4/5 MMT grossly within cervical precautions (no more than 90  FF), noted edema in forearm/hand and impaired sensation LUE Sensation: decreased light touch;history of peripheral neuropathy(reports edema/numbness for neuropathy) LUE Coordination:  WNL   Lower Extremity Assessment Lower Extremity Assessment: Overall WFL for tasks assessed   Cervical / Trunk Assessment Cervical / Trunk Assessment: Other exceptions Cervical / Trunk Exceptions: s/p cervical sx   Communication Communication Communication: No difficulties   Cognition Arousal/Alertness: Lethargic Behavior During Therapy: WFL for tasks assessed/performed Overall Cognitive Status: Within Functional Limits for tasks assessed                                     General Comments       Exercises     Shoulder Instructions      Home Living Family/patient expects to be discharged to:: Private residence Living Arrangements: Children(daughter stays with her) Available Help at Discharge: Family;Available PRN/intermittently(daughter works during daytime) Type of Home: House Home Access: Stairs to enter [employer] of Steps: 2   Home Layout: One level     Bathroom Shower/Tub: Teacher, early years/pre: [place]: Environmental consultant - 2 wheels;Cane - single point;Shower seat;Bedside commode;Crutches;Hand held shower head;Adaptive equipment Adaptive Equipment: Reacher        Prior Functioning/Environment Level of Independence: Independent with assistive device(s)        Comments: pt reports using cane for mobility, independent ADLs, limited IADLs, + driving        OT Problem List: Decreased strength;Decreased safety awareness;Decreased knowledge of use of DME or AE;Decreased knowledge of precautions;Impaired sensation;Increased edema      OT Treatment/Interventions: Self-care/ADL training;DME and/or AE instruction;Therapeutic activities;Patient/family education    OT Goals(Current goals can be found in the care plan section) Acute Rehab OT Goals Patient Stated Goal: to get home  OT Goal Formulation: With patient Time For Goal Achievement: 06/12/18 Potential to Achieve Goals: Good  OT Frequency: Min 2X/week    Barriers to D/C:            Co-evaluation              AM-PAC PT "6 Clicks" Daily Activity     Outcome Measure Help from another person eating meals?: None Help from another person taking care of personal grooming?: None Help from another person toileting, which includes using toliet, bedpan, or urinal?: A Little Help from another person bathing (including washing, rinsing, drying)?: A Little Help from another person to put on and taking off regular upper body clothing?: A Little Help from another person to put on and taking off regular lower body clothing?: A Little 6 Click Score: 20   End of Session Equipment Utilized During Treatment: Cervical collar Nurse Communication: Mobility status  Activity Tolerance: Patient tolerated treatment well Patient left: in bed;with call bell/phone within reach  OT Visit Diagnosis: Unsteadiness on feet (R26.81)                Time: 2355-7322 OT Time Calculation (min): 21 min Charges:  OT General Charges $OT Visit: 1 Visit OT Evaluation $OT Eval Moderate Complexity: 1 Mod  Delight Stare, OT Acute Rehabilitation Services Pager [phone] Office [phone]   Delight Stare 05/29/2018, 4:31 PM

## 2018-05-29 NOTE — Anesthesia Postprocedure Evaluation (Signed)
Anesthesia Post Note  Patient: [name]  Procedure(s) Performed: ANTERIOR CERVICAL DECOMPRESSION FUSION - CERVICAL FIVE-CERVICAL SIX - CERVICAL SIX-CERVICAL SEVEN (N/A Spine Cervical)     Patient location during evaluation: PACU Anesthesia Type: General Level of consciousness: awake and alert Pain management: pain level controlled Vital Signs Assessment: post-procedure vital signs reviewed and stable Respiratory status: spontaneous breathing, nonlabored ventilation, respiratory function stable and patient connected to nasal cannula oxygen Cardiovascular status: blood pressure returned to baseline and stable Postop Assessment: no apparent nausea or vomiting Anesthetic complications: no    Last Vitals:  Vitals:   05/29/18 1120 05/29/18 1157  BP: (!) 161/83 (!) 169/86  Pulse: 60 63  Resp: 16 16  Temp: 36.6 C 36.5 C  SpO2: 94% 97%    Last Pain:  Vitals:   05/29/18 1157  TempSrc: Oral  PainSc:                  [name] DAVID

## 2018-05-29 NOTE — Op Note (Signed)
Date of procedure: 05/29/2018  Date of dictation: Same  Service: Neurosurgery  Preoperative diagnosis: C5-6, C6-7 spondylosis with stenosis and myelopathy  Postoperative diagnosis: Same  Procedure Name: C5-6 C6-7 anterior cervical discectomy with interbody allograft and anterior plate instrumentation  Surgeon:Kamdin Follett A.Fleetwood Pierron, M.D.  Asst. Surgeon: Venetia Constable  Anesthesia: General  Indication: 72 year old female with neck and bilateral upper extremity symptoms left greater than right.  Work-up demonstrates evidence of marked spondylosis with severe stenosis and severe spinal cord compression worse at the C6-7 level.  Patient presents now for 2 level anterior cervical decompression and fusion in hopes of improving her symptoms.  Operative note: After induction anesthesia, patient position supine with neck slightly extended and held in place of Holter traction.  Patient's anterior cervical region prepped and draped sterilely.  Incision made overlying C6.  Dissection performed on the right.  Retractor placed.  Fluoroscopy used.  Levels confirmed.  Disc spaces incised.  Discectomy performed using various instruments down to level the posterior annulus.  Microscope then brought to the field used throughout the remainder of the discectomy.  Remaining aspects of annulus and osteophytes removed using high-speed drill down to level the posterior logical ligament.  Posterior logical is not elevated and resected in piecemeal fashion.  Underlying thecal sac was then identified.  Wide central decompression then performed undercutting the bodies of C5 and C6.  Decompression then proceeded into each neural foramen.  Each C6 nerve root was widely decompressed anteriorly.  At this point a very thorough decompression of been achieved.  There was no evidence of injury to the thecal sac or nerve roots.  Procedure was then repeated at C6-7 again without complications.  Wound was then irrigated with MI solution.  Gelfoam  was placed topically for hemostasis then removed.  6 mm allograft wedges were then impacted into place and recessed slightly from the anterior cortical margin of C5-6 and C6-7.  [employer] anterior cervical plate was then placed over the C5-6 and 7 levels.  This then attached under fluoroscopic guidance using 13 mm variable angle screws to each at all 3 levels.  All 6 screws given final tightening found to be solidly within the bone.  Locking screws were engaged in all levels.  Final images reveal good position of the bone grafts and the hardware at the proper operative level with normal alignment of the spine.  Wound was then irrigated one final time.  Hemostasis was assured.  Wound is then closed in layers with Vicryl sutures.  Steri-Strips and sterile dressing were applied.  No apparent complications.  Patient tolerated the procedure well and she returned to the recovery room postop.

## 2018-05-30 ENCOUNTER — Encounter (HOSPITAL_COMMUNITY): Payer: Self-pay | Admitting: Neurosurgery

## 2018-05-30 LAB — GLUCOSE, CAPILLARY: Glucose-Capillary: 105 mg/dL — ABNORMAL HIGH (ref 70–99)

## 2018-05-30 MED ORDER — CYCLOBENZAPRINE HCL 10 MG PO TABS: 10.0000 mg | ORAL_TABLET | Freq: Three times a day (TID) | ORAL | 0 refills | Status: DC | PRN

## 2018-05-30 MED ORDER — HYDROCODONE-ACETAMINOPHEN 5-325 MG PO TABS: 1.0000 | ORAL_TABLET | ORAL | 0 refills | Status: DC | PRN

## 2018-05-30 NOTE — Discharge Summary (Signed)
Physician Discharge Summary  Patient ID: [name] MRN: [id number] DOB/AGE: [birth year] 72 y.o.  Admit date: 05/29/2018 Discharge date: 05/30/2018  Admission Diagnoses:  Discharge Diagnoses:  Active Problems:   Cervical myelopathy [facility])   Discharged Condition: good  Hospital Course: Patient admitted to the hospital where she underwent uncomplicated 2 level anterior cervical decompression and fusion.  Postoperatively doing very well.  Preoperative neck and upper extremity symptoms much improved.  Standing and walking without difficulty.  Ready for discharge home.  Consults:   Significant Diagnostic Studies:   Treatments:   Discharge Exam: Blood pressure (!) 148/88, pulse 64, temperature 98 F (36.7 C), temperature source Oral, resp. rate 16, SpO2 97 %. Awake and alert.  Oriented and appropriate.  Cranial nerve function intact.  Motor and sensory function extremities normal.  Wound clean and dry.  Chest and abdomen benign.  Disposition: Discharge disposition: 01-Home or Self Care        Allergies as of 05/30/2018      Reactions   Percocet [oxycodone-acetaminophen] Hives, Itching, Other (See Comments)   hallucinations   Pravachol [pravastatin Sodium] Other (See Comments)   Muscle aches   Hydrocodone Other (See Comments)   "crazy dreams"   Aspirin Other (See Comments)   GI upset      Medication List    TAKE these medications   acetaminophen 500 MG tablet Commonly known as:  TYLENOL Take 1,000 mg by mouth every 6 (six) hours as needed for moderate pain or headache.   aspirin 81 MG chewable tablet Chew 1 tablet (81 mg total) by mouth daily.   BRILINTA 90 MG Tabs tablet Generic drug:  ticagrelor TAKE 1 TABLET BY MOUTH 2 TIMES A DAY   clopidogrel 75 MG tablet Commonly known as:  PLAVIX Take 1 tablet (75 mg total) by mouth daily.   cyclobenzaprine 10 MG tablet Commonly known as:  FLEXERIL Take 1 tablet (10 mg total) by mouth 3 (three) times daily as needed  for muscle spasms.   diclofenac sodium 1 % Gel Commonly known as:  VOLTAREN Apply 2 g topically 3 (three) times daily as needed (for arm pain).   DULoxetine 30 MG capsule Commonly known as:  CYMBALTA Take 1 capsule (30 mg total) by mouth daily.   fluticasone 50 MCG/ACT nasal spray Commonly known as:  FLONASE Place 1 spray into both nostrils 2 (two) times daily as needed for allergies. What changed:    how much to take  when to take this   gabapentin 400 MG capsule Commonly known as:  NEURONTIN Take 1 capsule (400 mg total) by mouth 2 (two) times daily. What changed:  when to take this   HYDROcodone-acetaminophen 5-325 MG tablet Commonly known as:  NORCO/VICODIN Take 1 tablet by mouth every 4 (four) hours as needed for moderate pain ((score 4 to 6)).   ipratropium 0.06 % nasal spray Commonly known as:  ATROVENT Place 2 sprays into both nostrils 4 (four) times daily.   metoprolol tartrate 25 MG tablet Commonly known as:  LOPRESSOR Take 1 tablet (25 mg total) by mouth 2 (two) times daily.   multivitamin tablet Take 1 tablet by mouth daily.   nitroGLYCERIN 0.4 MG SL tablet Commonly known as:  NITROSTAT Place 1 tablet (0.4 mg total) under the tongue every 5 (five) minutes as needed for chest pain.   polyethylene glycol powder powder Commonly known as:  GLYCOLAX/MIRALAX Take 17 g by mouth daily. What changed:  when to take this   ranitidine 150  MG tablet Commonly known as:  ZANTAC TAKE 1 TABLET BY MOUTH TWICE A DAY   rosuvastatin 20 MG tablet Commonly known as:  CRESTOR Take 1 tablet (20 mg total) by mouth daily at 6 PM.   tizanidine 2 MG capsule Commonly known as:  ZANAFLEX Take 1 capsule (2 mg total) by mouth 3 (three) times daily.        Signed: Cooper Render Aylene Acoff 05/30/2018, 10:30 AM

## 2018-05-30 NOTE — Discharge Instructions (Signed)
Wound Care Keep incision area dry.   Do not put any creams, lotions, or ointments on incision. Leave steri-strips on neck.  They will fall off by themselves. Activity Walk each and every day, increasing distance each day. No lifting greater than 5 lbs.  Avoid excessive neck motion. No driving for 2 weeks; may ride as a passenger locally. Wear neck brace at all times except when showering. Diet Resume your normal diet.  Return to Work Will be discussed at you follow up appointment. Call Your Doctor If Any of These Occur Redness, drainage, or swelling at the wound.  Temperature greater than 101 degrees. Severe pain not relieved by pain medication. Increased difficulty swallowing.  Incision starts to come apart. Follow Up Appt Call for appointment in 1-2 weeks ([phone]) or for problems.  If you have any hardware placed in your spine, you will need an x-ray before your appointment.

## 2018-05-30 NOTE — Progress Notes (Signed)
Occupational Therapy Treatment Patient Details Name: [name] MRN: [id number] DOB: [birth year] Today's Date: 05/30/2018    History of present illness Pt is a 72 y/o female s/p C5-6 C6-7 ACDF.  PMH signficant for but not limited to: anxiety, arthritis, HTN, phenuomia, DM type 2, lumbar sx.    OT comments  Pt progressing well.  Completing bed mobility and transfers with modified independence today.  Intermittent cueing to adhere to cervical precautions during session.  Patient return demonstrated use of sock aide/reacher for LB dressing with supervision.  Will continue to follow while admitted.    Follow Up Recommendations  No OT follow up;Supervision - Intermittent    Equipment Recommendations  None recommended by OT(all needs met already)    Recommendations for Other Services      Precautions / Restrictions Precautions Precautions: Cervical Precaution Booklet Issued: Yes (comment) Precaution Comments: reviewed precautions with patient, able to recall without assist; min cueing to adhere to during self care tasks  Required Braces or Orthoses: Cervical Brace Cervical Brace: Soft collar;At all times Restrictions Weight Bearing Restrictions: No       Mobility Bed Mobility Overal bed mobility: Modified Independent                Transfers Overall transfer level: Modified independent                    Balance Overall balance assessment: Mild deficits observed, not formally tested                                         ADL either performed or assessed with clinical judgement   ADL Overall ADL's : Needs assistance/impaired     Grooming: Modified independent;Standing               Lower Body Dressing: Supervision/safety;Sit to/from stand Lower Body Dressing Details (indicate cue type and reason): educated and demonstrated use of AE for LB dressing (sock aide, reacher) pt return demonstrates with supervision Toilet Transfer:  Modified Independent;Ambulation(simulated in room)       Tub/ Shower Transfer: Tub transfer;Supervision/safety;Ambulation;Shower Scientist, research (medical) Details (indicate cue type and reason): reviewed safety, simulated in room with supervision; has  Functional mobility during ADLs: Modified independent General ADL Comments: reviewed AE, precautions and ADL compensatory techniques     Vision   Vision Assessment?: No apparent visual deficits   Perception     Praxis      Cognition Arousal/Alertness: Awake/alert Behavior During Therapy: WFL for tasks assessed/performed Overall Cognitive Status: Within Functional Limits for tasks assessed                                          Exercises     Shoulder Instructions       General Comments      Pertinent Vitals/ Pain       Pain Assessment: No/denies pain  Home Living                                          Prior Functioning/Environment              Frequency  Min 2X/week  Progress Toward Goals  OT Goals(current goals can now be found in the care plan section)  Progress towards OT goals: Progressing toward goals  Acute Rehab OT Goals Patient Stated Goal: home today OT Goal Formulation: With patient Time For Goal Achievement: 06/12/18 Potential to Achieve Goals: Good  Plan Discharge plan remains appropriate;Frequency remains appropriate    Co-evaluation                 AM-PAC PT "6 Clicks" Daily Activity     Outcome Measure   Help from another person eating meals?: None Help from another person taking care of personal grooming?: None Help from another person toileting, which includes using toliet, bedpan, or urinal?: None Help from another person bathing (including washing, rinsing, drying)?: None Help from another person to put on and taking off regular upper body clothing?: None Help from another person to put on and taking off regular lower body  clothing?: None 6 Click Score: 24    End of Session Equipment Utilized During Treatment: Cervical collar  OT Visit Diagnosis: Unsteadiness on feet (R26.81)   Activity Tolerance Patient tolerated treatment well   Patient Left in bed;with call bell/phone within reach   Nurse Communication Mobility status        Time: 0722-0737 OT Time Calculation (min): 15 min  Charges: OT General Charges $OT Visit: 1 Visit OT Treatments $Self Care/Home Management : 8-22 mins  Delight Stare, OT Acute Rehabilitation Services Pager [phone] Office [phone]    Delight Stare 05/30/2018, 7:45 AM

## 2018-05-30 NOTE — Progress Notes (Signed)
Patient is discharged from room 3C07 at this time. Alert and in stable condition. IV site d/c'd and instructions read to patient and daughter with understanding verbalized. Left unit via wheelchair with all belongings at side.  

## 2018-05-31 ENCOUNTER — Telehealth: Payer: Self-pay

## 2018-05-31 LAB — BPAM RBC
Blood Product Expiration Date: 201911152359
Blood Product Expiration Date: 201911152359
Unit Type and Rh: 6200
Unit Type and Rh: 6200

## 2018-05-31 LAB — TYPE AND SCREEN
ABO/RH(D): A POS
Antibody Screen: POSITIVE
Donor AG Type: NEGATIVE
Donor AG Type: NEGATIVE
Unit division: 0
Unit division: 0

## 2018-05-31 NOTE — Telephone Encounter (Signed)
**Note De-Identified Lin Glazier Obfuscation** We received a letter from [employer] stating that they have denied the pts Brilinta PA. It is unclear who started this PA.  They also sent a Request for Redetermination of Medicare Prescription Drug Denial form so we can appeal their decision. I have completed the form and faxed it back to [place].

## 2018-07-04 DIAGNOSIS — M4802 Spinal stenosis, cervical region: Secondary | ICD-10-CM | POA: Diagnosis not present

## 2018-07-20 ENCOUNTER — Telehealth: Payer: Self-pay | Admitting: Internal Medicine

## 2018-07-20 DIAGNOSIS — I2510 Atherosclerotic heart disease of native coronary artery without angina pectoris: Secondary | ICD-10-CM

## 2018-07-20 NOTE — Telephone Encounter (Signed)
I spoke with pt. Cost of Brilinta will be going up in January. She is asking if she can be changed to a cheaper medication.  She has 2 weeks of Brilinta left.

## 2018-07-20 NOTE — Telephone Encounter (Signed)
New Massage    Pt c/o medication issue:  1. Name of Medication: Brillinta  2. How are you currently taking this medication (dosage and times per day)? 90mg  Twice daily  3. Are you having a reaction (difficulty breathing--STAT)? No   4. What is your medication issue? Pt cant afford medication, calling to see if she can get something else

## 2018-07-21 ENCOUNTER — Other Ambulatory Visit: Payer: Medicare HMO

## 2018-07-21 ENCOUNTER — Telehealth: Payer: Self-pay | Admitting: Internal Medicine

## 2018-07-21 DIAGNOSIS — I2510 Atherosclerotic heart disease of native coronary artery without angina pectoris: Secondary | ICD-10-CM

## 2018-07-21 LAB — CBC
Hematocrit: 40.6 % (ref 34.0–46.6)
Hemoglobin: 13.6 g/dL (ref 11.1–15.9)
MCH: 29.2 pg (ref 26.6–33.0)
MCHC: 33.5 g/dL (ref 31.5–35.7)
MCV: 87 fL (ref 79–97)
Platelets: 246 10*3/uL (ref 150–450)
RBC: 4.65 x10E6/uL (ref 3.77–5.28)
RDW: 16.1 % — ABNORMAL HIGH (ref 12.3–15.4)
WBC: 7 10*3/uL (ref 3.4–10.8)

## 2018-07-21 NOTE — Telephone Encounter (Signed)
New Message ° ° ° ° ° ° ° ° ° °Patient returned your call °

## 2018-07-21 NOTE — Telephone Encounter (Signed)
Left message for patient to call back  

## 2018-07-21 NOTE — Telephone Encounter (Signed)
Spoke with patient re: plavix. She tells me she is taking "this other blood thinner too" and how long should she take this.  After further discussion:  After pts neurosurgery (she thinks 05/30/18), she went home and restarted Brilinta and aspirin and also started Plavix.   Has continued all 3 ever since.  Adv pt to not take any more Brilinta.  She reports fatigue recently but reports her son had leg amputation, gets dialysis and had to move in with her.  She is overwhelmed and exhausted.  Adv patient to come by office today for CBC. I adv her to plan to continue asa and Plavix, unless she hears back from [place]. Verbalizes understanding, will not take any more doses of Brilinta.

## 2018-07-21 NOTE — Telephone Encounter (Signed)
Switch to Plavix 75 mg     I will address long term use when I see her again

## 2018-07-21 NOTE — Telephone Encounter (Signed)
See previous telephone encounter.

## 2018-07-24 ENCOUNTER — Telehealth: Payer: Self-pay | Admitting: Internal Medicine

## 2018-07-24 NOTE — Telephone Encounter (Signed)
Informed pt of results. Pt verbalized understanding. 

## 2018-07-24 NOTE — Telephone Encounter (Signed)
New Message         Patient would like a call back concerning lab results.

## 2018-07-29 ENCOUNTER — Other Ambulatory Visit: Payer: Self-pay | Admitting: Family Medicine

## 2018-07-29 DIAGNOSIS — K2100 Gastro-esophageal reflux disease with esophagitis, without bleeding: Secondary | ICD-10-CM

## 2018-07-31 ENCOUNTER — Other Ambulatory Visit: Payer: Self-pay | Admitting: Family Medicine

## 2018-07-31 DIAGNOSIS — K2100 Gastro-esophageal reflux disease with esophagitis, without bleeding: Secondary | ICD-10-CM

## 2018-07-31 NOTE — Telephone Encounter (Signed)
Copied from [place] [id number]. Topic: Quick Communication - Rx Refill/Question >> Jul 31, 2018 12:39 PM [place], [place] D wrote: Medication: ranitidine (ZANTAC) 150 MG tablet / Pt advised by pharmacy to contact office for refill. Was told OV was needed. Pt scheduled for 08/08/18  Has the patient contacted their pharmacy? Yes.   (Agent: If no, request that the patient contact the pharmacy for the refill.) (Agent: If yes, when and what did the pharmacy advise?)  Preferred Pharmacy (with phone number or street name): [id number] - [place], [place] [phone] (Phone) [phone] (Fax)    Agent: Please be advised that RX refills may take up to 3 business days. We ask that you follow-up with your pharmacy.

## 2018-08-01 MED ORDER — RANITIDINE HCL 150 MG PO TABS: 150.0000 mg | ORAL_TABLET | Freq: Two times a day (BID) | ORAL | 0 refills | Status: DC

## 2018-08-01 NOTE — Telephone Encounter (Signed)
Requested Prescriptions  Pending Prescriptions Disp Refills  . ranitidine (ZANTAC) 150 MG tablet 180 tablet 2    Sig: Take 1 tablet (150 mg total) by mouth 2 (two) times daily.     Gastroenterology:  H2 Antagonists Failed - 07/31/2018  5:56 PM      Failed - Valid encounter within last 12 months    Recent Outpatient Visits          9 months ago URI, acute   Therapist, music at [name], Malka So, MD   10 months ago Nurse, adult at [name], Malka So, MD   10 months ago CKD (chronic kidney disease) stage 3, GFR 30-59 ml/min ([place])   [place] at [name], Malka So, MD   1 year ago [place] at [name], Malka So, MD   1 year ago Upper back pain   [place] at [name], Malka So, MD      Future Appointments            In 1 week [name], Malka So, MD [employer] at [place], [facility]

## 2018-08-08 ENCOUNTER — Ambulatory Visit: Payer: Medicare HMO | Admitting: Family Medicine

## 2018-08-10 DIAGNOSIS — M4802 Spinal stenosis, cervical region: Secondary | ICD-10-CM | POA: Diagnosis not present

## 2018-08-16 ENCOUNTER — Ambulatory Visit (INDEPENDENT_AMBULATORY_CARE_PROVIDER_SITE_OTHER): Payer: Medicare HMO | Admitting: Family Medicine

## 2018-08-16 ENCOUNTER — Encounter: Payer: Self-pay | Admitting: Family Medicine

## 2018-08-16 VITALS — BP 130/82 | HR 64 | Temp 97.8°F | Resp 16 | Ht 66.0 in | Wt 215.1 lb

## 2018-08-16 DIAGNOSIS — I10 Essential (primary) hypertension: Secondary | ICD-10-CM

## 2018-08-16 DIAGNOSIS — K2100 Gastro-esophageal reflux disease with esophagitis, without bleeding: Secondary | ICD-10-CM

## 2018-08-16 DIAGNOSIS — E785 Hyperlipidemia, unspecified: Secondary | ICD-10-CM | POA: Diagnosis not present

## 2018-08-16 DIAGNOSIS — Z9189 Other specified personal risk factors, not elsewhere classified: Secondary | ICD-10-CM | POA: Diagnosis not present

## 2018-08-16 DIAGNOSIS — N183 Chronic kidney disease, stage 3 unspecified: Secondary | ICD-10-CM

## 2018-08-16 DIAGNOSIS — E119 Type 2 diabetes mellitus without complications: Secondary | ICD-10-CM

## 2018-08-16 DIAGNOSIS — J3089 Other allergic rhinitis: Secondary | ICD-10-CM

## 2018-08-16 DIAGNOSIS — Z1159 Encounter for screening for other viral diseases: Secondary | ICD-10-CM | POA: Diagnosis not present

## 2018-08-16 DIAGNOSIS — G959 Disease of spinal cord, unspecified: Secondary | ICD-10-CM

## 2018-08-16 DIAGNOSIS — E1169 Type 2 diabetes mellitus with other specified complication: Secondary | ICD-10-CM

## 2018-08-16 DIAGNOSIS — E1122 Type 2 diabetes mellitus with diabetic chronic kidney disease: Secondary | ICD-10-CM

## 2018-08-16 LAB — MICROALBUMIN / CREATININE URINE RATIO
Creatinine,U: 124.8 mg/dL
Microalb Creat Ratio: 41.1 mg/g — ABNORMAL HIGH (ref 0.0–30.0)
Microalb, Ur: 51.3 mg/dL — ABNORMAL HIGH (ref 0.0–1.9)

## 2018-08-16 LAB — HEMOGLOBIN A1C: Hgb A1c MFr Bld: 5.9 % (ref 4.6–6.5)

## 2018-08-16 LAB — LIPID PANEL
Cholesterol: 177 mg/dL (ref 0–200)
HDL: 62.4 mg/dL (ref >39.00)
LDL Cholesterol: 94 mg/dL (ref 0–99)
NonHDL: 114.34
Total CHOL/HDL Ratio: 3
Triglycerides: 102 mg/dL (ref 0.0–149.0)
VLDL: 20.4 mg/dL (ref 0.0–40.0)

## 2018-08-16 LAB — VITAMIN D 25 HYDROXY (VIT D DEFICIENCY, FRACTURES): VITD: 12.87 ng/mL — ABNORMAL LOW (ref 30.00–100.00)

## 2018-08-16 MED ORDER — DOXYCYCLINE HYCLATE 100 MG PO TABS: 100.0000 mg | ORAL_TABLET | Freq: Two times a day (BID) | ORAL | 0 refills | Status: AC

## 2018-08-16 MED ORDER — PREDNISONE 20 MG PO TABS: 40.0000 mg | ORAL_TABLET | Freq: Every day | ORAL | 0 refills | Status: DC

## 2018-08-16 MED ORDER — RANITIDINE HCL 150 MG PO TABS: 150.0000 mg | ORAL_TABLET | Freq: Two times a day (BID) | ORAL | 2 refills | Status: DC

## 2018-08-16 MED ORDER — DULOXETINE HCL 30 MG PO CPEP: 30.0000 mg | ORAL_CAPSULE | Freq: Every day | ORAL | 1 refills | Status: DC

## 2018-08-16 NOTE — Progress Notes (Signed)
ACUTE VISIT   HPI:  Chief Complaint  Patient presents with  . Cough    sx started last Thursday  . Nasal Congestion  . Medication Refill    [name] is a 73 y.o. female, who is here today complaining of 6 days of respiratory symptoms.   She also would like to follow on some of her chronic medical problems. Last visit was in 09/2017, when she was seen for acute visit, URI.   Nasal congestion,rhinorrhea,and post nasal drainage. Frontal pressure headache,"not bad."  "Little" wheezing and non productive cough. She denies dyspnea or chest pain.  Symptoms are worse in the morning.  No Hx of asthma. Hx of allergic rhinitis. No sick contact or recent travel.  She has taken Tylenol.  Hypertension and CKD 3: CAD following with cardiologist. She is on Metoprolol Tartrate 25 mg bid.  Lab Results  Component Value Date   CREATININE 1.22 (H) 05/25/2018   BUN 15 05/25/2018   NA 138 05/25/2018   K 4.2 05/25/2018   CL 110 05/25/2018   CO2 23 05/25/2018   Denies visual changes, chest pain, dyspnea, palpitation, claudication, focal weakness, or unusual edema.  Hyperlipidemia: She is on Crestor 20 mg daily. She has appt with cardiologist in a few weeks and she has future labs already scheduled. She has tried to follow a low fat diet.  Lab Results  Component Value Date   CHOL 148 09/08/2017   HDL 59.90 09/08/2017   LDLCALC 72 09/08/2017   LDLDIRECT 171.5 12/06/2011   TRIG 83.0 09/08/2017   CHOLHDL 2 09/08/2017   DM II on non pharmacologic treatment. Denies abdominal pain, nausea,vomiting, polydipsia,polyuria, or polyphagia.  She is not exercising regularly. Problem complicated by HTN,obesity,and CAD among some.   Lab Results  Component Value Date   HGBA1C 6.0 (H) 05/01/2018   Lab Results  Component Value Date   MICROALBUR 36.4 (H) 06/01/2016   Chronic pain, history of cervical myelopathy, lower back pain,and polyarthralgias. RA follow with  rheumatologist. .  Since her last OV she has undergone cervical spine surgery (05/29/18). Pain improved greatly but still having UE numbness.  Currently she was on Cymbalta 30 mg daily, she has not taken med for a few months. She is not sure if medication was helping with pain or anxiety. She is on gabapentin 400 mg at bedtime. She also takes Flexeril 10 mg 3 times daily as needed.  She is followed with neurologist.  GERD: She has been on Zantac 150 mg bid,which has helped with acid reflux.  Denies abdominal pain, nausea, vomiting, changes in bowel habits, blood in stool or melena.   Review of Systems  Constitutional: Positive for fatigue. Negative for activity change, appetite change and fever.  HENT: Positive for congestion, postnasal drip and rhinorrhea. Negative for facial swelling, mouth sores, nosebleeds, sore throat and trouble swallowing.   Eyes: Negative for redness and visual disturbance.  Respiratory: Positive for cough and wheezing. Negative for shortness of breath.   Cardiovascular: Negative for chest pain, palpitations and leg swelling.  Gastrointestinal: Negative for abdominal pain, nausea and vomiting.       Negative for changes in bowel habits.  Endocrine: Negative for polydipsia, polyphagia and polyuria.  Genitourinary: Negative for decreased urine volume, dysuria and hematuria.  Musculoskeletal: Positive for arthralgias, back pain and neck pain.  Skin: Negative for rash and wound.  Neurological: Positive for numbness and headaches. Negative for syncope and weakness.  Psychiatric/Behavioral: Negative for confusion. The  patient is nervous/anxious.       Current Outpatient Medications on File Prior to Visit  Medication Sig Dispense Refill  . acetaminophen (TYLENOL) 500 MG tablet Take 1,000 mg by mouth every 6 (six) hours as needed for moderate pain or headache.     [name] Kitchen aspirin 81 MG chewable tablet Chew 81 mg by mouth daily.    . clopidogrel (PLAVIX) 75 MG tablet  Take 1 tablet (75 mg total) by mouth daily. 30 tablet 3  . cyclobenzaprine (FLEXERIL) 10 MG tablet Take 1 tablet (10 mg total) by mouth 3 (three) times daily as needed for muscle spasms. 30 tablet 0  . diclofenac sodium (VOLTAREN) 1 % GEL Apply 2 g topically 3 (three) times daily as needed (for arm pain).    . fluticasone (FLONASE) 50 MCG/ACT nasal spray Place 1 spray into both nostrils 2 (two) times daily as needed for allergies. (Patient taking differently: Place 2 sprays into both nostrils at bedtime as needed for allergies. ) 32 g 7  . gabapentin (NEURONTIN) 400 MG capsule Take 1 capsule (400 mg total) by mouth 2 (two) times daily. (Patient taking differently: Take 400 mg by mouth at bedtime. ) 60 capsule 12  . HYDROcodone-acetaminophen (NORCO/VICODIN) 5-325 MG tablet Take 1 tablet by mouth every 4 (four) hours as needed for moderate pain ((score 4 to 6)). 30 tablet 0  . ipratropium (ATROVENT) 0.06 % nasal spray Place 2 sprays into both nostrils 4 (four) times daily. 15 mL 2  . metoprolol tartrate (LOPRESSOR) 25 MG tablet Take 1 tablet (25 mg total) by mouth 2 (two) times daily. 180 tablet 3  . Multiple Vitamin (MULTIVITAMIN) tablet Take 1 tablet by mouth daily.    . nitroGLYCERIN (NITROSTAT) 0.4 MG SL tablet Place 1 tablet (0.4 mg total) under the tongue every 5 (five) minutes as needed for chest pain. 30 tablet 6  . polyethylene glycol powder (GLYCOLAX/MIRALAX) powder Take 17 g by mouth daily. (Patient taking differently: Take 17 g by mouth every other day. ) 500 g 3  . rosuvastatin (CRESTOR) 20 MG tablet Take 1 tablet (20 mg total) by mouth daily at 6 PM. 90 tablet 3  . tizanidine (ZANAFLEX) 2 MG capsule Take 1 capsule (2 mg total) by mouth 3 (three) times daily. 135 capsule 1   No current facility-administered medications on file prior to visit.      Past Medical History:  Diagnosis Date  . Allergy   . Anxiety   . Arthritis    RA (Dr. Ouida Sills) Bilateral hands  . C. difficile diarrhea     history of  . CAD (coronary artery disease), native coronary artery    a. Cardiac CT with + FFR for Ramus b. cath 05/2017 100% sub acute lesion to mid circumflex artery s/p DES.  . Cancer ([place])    Right kidney CA removed.   . CKD (chronic kidney disease) stage 3, GFR 30-59 ml/min ([place]) 10/11/2016   s/p R nephrectomy  . Colon polyps 2008   HYPERPLASTIC  . Costochondritis    a. Nuc Stress Test 6/16: EF 70, no scar or ischemia, low risk // b. Echo 3/16: mild conc LVH, EF 65-70, no RWMA, Gr 1 DD, mild TR  . Diabetes mellitus without complication ([place])    type 2  no meds  . Dyspnea   . Gastritis   . GERD (gastroesophageal reflux disease)   . History of echocardiogram    Echo 3/18:  Moderate LVH, EF 64-33, grade 1 diastolic  dysfunction, calcified aortic valve, mild MR, moderate LAE  . History of nuclear stress test    Myoview 3/18: Mod size and intensity fixed septal defect, may be artifact.Opposite mod size and intensity lat defect, which is reversible and could represent ischemia or possibly artifact (SDS 4). LVEF 71% with normal wall motion. Intermediate risk study. >> images reviewed with Dr. Dorris Carnes - no sig ischemia; med rx   . History of pneumonia   . Hx of cardiovascular stress test    Lexiscan Myoview 6/16:  EF 70%, no scar or ischemia; Low Risk  . Hypertension   . Neuromuscular disorder ([place])    Neruopathy in bilateral feet  . Neuropathy   . Orthostatic hypotension 05/28/2017  . Osteoarthritis   . Pneumonia   . S/P angioplasty with stent 05/27/17 to LCX with DES  05/28/2017  . Thyroid disease    Allergies  Allergen Reactions  . Percocet [Oxycodone-Acetaminophen] Hives, Itching and Other (See Comments)    hallucinations  . Pravachol [Pravastatin Sodium] Other (See Comments)    Muscle aches  . Hydrocodone Other (See Comments)    "crazy dreams"  . Aspirin Other (See Comments)    GI upset Other reaction(s): Other (See Comments) REACTION: GI upset    Social  History   Socioeconomic History  . Marital status: Divorced    Spouse name: Not on file  . Number of children: 3  . Years of education: 19  . Highest education level: Not on file  Occupational History  . Occupation: Retired    Fish farm manager: RETIRED  Social Needs  . Financial resource strain: Not on file  . Food insecurity:    Worry: Not on file    Inability: Not on file  . Transportation needs:    Medical: Not on file    Non-medical: Not on file  Tobacco Use  . Smoking status: Current Every Day Smoker    Packs/day: 0.50    Years: 15.00    Pack years: 7.50    Types: Cigarettes    Last attempt to quit: 05/27/2017    Years since quitting: 1.2  . Smokeless tobacco: Never Used  . Tobacco comment: 03/16/17  1 pk/week  Substance and Sexual Activity  . Alcohol use: No    Alcohol/week: 0.0 standard drinks  . Drug use: No  . Sexual activity: Not on file  Lifestyle  . Physical activity:    Days per week: Not on file    Minutes per session: Not on file  . Stress: Not on file  Relationships  . Social connections:    Talks on phone: Not on file    Gets together: Not on file    Attends religious service: Not on file    Active member of club or organization: Not on file    Attends meetings of clubs or organizations: Not on file    Relationship status: Not on file  Other Topics Concern  . Not on file  Social History Narrative   Single, dgtr lives with her   Never Smoked    Alcohol use- no   Drug use-no   Regular Exercise-yes   Former Smoker- 12/2008    Vitals:   08/16/18 0908  BP: 130/82  Pulse: 64  Resp: 16  Temp: 97.8 F (36.6 C)  SpO2: 99%   Body mass index is 34.72 kg/m.    Physical Exam  Nursing note and vitals reviewed. Constitutional: She is oriented to person, place, and time. She appears well-developed. No  distress.  HENT:  Head: Normocephalic and atraumatic.  Nose: Rhinorrhea present. Right sinus exhibits no maxillary sinus tenderness. Left sinus  exhibits no maxillary sinus tenderness.  Mouth/Throat: Oropharynx is clear and moist and mucous membranes are normal.  Nasal voice. Post nasal drainage. Hypertrophic turbinates. Mild tenderness upon pressing frontal sinuses.  Eyes: Pupils are equal, round, and reactive to light. Conjunctivae are normal.  Cardiovascular: Normal rate and regular rhythm.  Murmur (soft SEM RUSB) heard. Pulses:      Dorsalis pedis pulses are 2+ on the right side and 2+ on the left side.  Respiratory: Effort normal and breath sounds normal. No respiratory distress.  GI: Soft. She exhibits no mass. There is no hepatomegaly. There is no abdominal tenderness.  Musculoskeletal:        General: No edema.     Cervical back: She exhibits decreased range of motion. She exhibits no tenderness.     Lumbar back: She exhibits no tenderness and no bony tenderness.  Lymphadenopathy:    She has no cervical adenopathy.  Neurological: She is alert and oriented to person, place, and time. She has normal strength. No cranial nerve deficit. Gait normal.  Skin: Skin is warm. No rash noted. No erythema.  Psychiatric: Her mood appears anxious.  Well groomed, good eye contact.   Diabetic Foot Exam - Simple   Simple Foot Form Diabetic Foot exam was performed with the following findings:  Yes 08/16/2018  7:43 PM  Visual Inspection See comments:  Yes Sensation Testing Intact to touch and monofilament testing bilaterally:  Yes Pulse Check Posterior Tibialis and Dorsalis pulse intact bilaterally:  Yes Comments Hypertrophic and long toenail.      ASSESSMENT AND PLAN:  Ms. Skylor was seen today for cough, nasal congestion and medication refill.  Orders Placed This Encounter  Procedures  . Lipid panel  . Hemoglobin A1c  . Fructosamine  . VITAMIN D 25 Hydroxy (Vit-D Deficiency, Fractures)  . Hepatitis C antibody  . Microalbumin / creatinine urine ratio   Lab Results  Component Value Date   MICROALBUR 51.3 (H) 08/16/2018     Lab Results  Component Value Date   HGBA1C 5.9 08/16/2018   Lab Results  Component Value Date   CHOL 177 08/16/2018   HDL 62.40 08/16/2018   LDLCALC 94 08/16/2018   LDLDIRECT 171.5 12/06/2011   TRIG 102.0 08/16/2018   CHOLHDL 3 08/16/2018    Non-seasonal allergic rhinitis, unspecified trigger Explained that I do not think abx is needed at this time. She agrees with short course of prednisone, which she has tolerated well in the past.Side effects discussed. Nasal irrigations with saline a few times per day. Flonase nasal spray. If not improvement in 4-5 days she can go ahead and fill abx prescriptions.  -     predniSONE (DELTASONE) 20 MG tablet; Take 2 tablets (40 mg total) by mouth daily with breakfast.  Hyperlipidemia associated with type 2 diabetes mellitus (HCC) No changes in current management, will follow labs done today and will give further recommendations accordingly.  -     Lipid panel  Essential hypertension Adequately controlled. No changes in current management. Continue low salt diet. Eye exam is current. F/U in 6 months, before if needed.  Cervical myelopathy (HCC) Cervical pain improved with surgery but still having UE numbness. She would like to resume Cymbalta 30 mg,we discussed some side effects. F/U in 6 months,before if needed.  -     DULoxetine (CYMBALTA) 30 MG capsule; Take 1 capsule (  30 mg total) by mouth daily.  CKD (chronic kidney disease) stage 3, GFR 30-59 ml/min (HCC) Cr 1.2-1.3, e GFR mid 40's. Adequate BP and DM controlled. Continue low salt diet. She is not on ACEI's or ARB. Avoid NSAID's.   -     VITAMIN D 25 Hydroxy (Vit-D Deficiency, Fractures) -     Microalbumin / creatinine urine ratio  Encounter for HCV screening test for high risk patient -     Hepatitis C antibody  Gastroesophageal reflux disease with esophagitis Well controlled. No changes in current management. GERD precautions to continue.  -     ranitidine  (ZANTAC) 150 MG tablet; Take 1 tablet (150 mg total) by mouth 2 (two) times daily.  Type 2 diabetes mellitus with stage 3 chronic kidney disease, without long-term current use of insulin ([place]) HgA1C has been at goal. Continue non pharmacologic treatment. Regular exercise and healthy diet with avoidance of added sugar food intake is an important part of treatment and recommended. Annual eye exam, periodic dental and foot care recommended. F/U in 5-6 months  Other orders -     doxycycline (VIBRA-TABS) 100 MG tablet; Take 1 tablet (100 mg total) by mouth 2 (two) times daily for 7 days.     Return in about 6 months (around 02/14/2019) for cpe.      Milford Cilento G. [name], MD  [facility]. [place] office.

## 2018-08-16 NOTE — Patient Instructions (Addendum)
A few things to remember from today's visit:   Hyperlipidemia associated with type 2 diabetes mellitus ([place]) - Plan: Lipid panel  Essential hypertension  Non-insulin dependent type 2 diabetes mellitus -diet - Plan: Hemoglobin A1c, Fructosamine  Cervical myelopathy (HCC)  CKD (chronic kidney disease) stage 3, GFR 30-59 ml/min (HCC) - Plan: VITAMIN D 25 Hydroxy (Vit-D Deficiency, Fractures)  Encounter for HCV screening test for high risk patient - Plan: Hepatitis C antibody  Gastroesophageal reflux disease with esophagitis - Plan: ranitidine (ZANTAC) 150 MG tablet  I do not think antibiotic is needed at this time. If not better in 4-5 days start antibiotic treatment.  Nasal irrigations with saline a few times per day. Flonase nasal spray.    Please be sure medication list is accurate. If a new problem present, please set up appointment sooner than planned today.

## 2018-08-19 ENCOUNTER — Encounter: Payer: Self-pay | Admitting: Family Medicine

## 2018-08-19 LAB — HEPATITIS C ANTIBODY
Hepatitis C Ab: NONREACTIVE
SIGNAL TO CUT-OFF: 0.14

## 2018-08-19 LAB — FRUCTOSAMINE: Fructosamine: 255 umol/L (ref 205–285)

## 2018-08-21 ENCOUNTER — Other Ambulatory Visit: Payer: Self-pay | Admitting: *Deleted

## 2018-08-21 MED ORDER — VITAMIN D (ERGOCALCIFEROL) 1.25 MG (50000 UNIT) PO CAPS: 50000.0000 [IU] | ORAL_CAPSULE | ORAL | 0 refills | Status: AC

## 2018-08-21 MED ORDER — LOSARTAN POTASSIUM 25 MG PO TABS: 25.0000 mg | ORAL_TABLET | Freq: Every day | ORAL | 2 refills | Status: DC

## 2018-08-21 MED ORDER — ROSUVASTATIN CALCIUM 40 MG PO TABS: 20.0000 mg | ORAL_TABLET | Freq: Every day | ORAL | 2 refills | Status: DC

## 2018-08-23 ENCOUNTER — Other Ambulatory Visit: Payer: Self-pay | Admitting: Family Medicine

## 2018-08-23 DIAGNOSIS — I10 Essential (primary) hypertension: Secondary | ICD-10-CM

## 2018-08-29 ENCOUNTER — Other Ambulatory Visit (INDEPENDENT_AMBULATORY_CARE_PROVIDER_SITE_OTHER): Payer: Medicare HMO

## 2018-08-29 DIAGNOSIS — I10 Essential (primary) hypertension: Secondary | ICD-10-CM | POA: Diagnosis not present

## 2018-08-29 LAB — BASIC METABOLIC PANEL
BUN: 22 mg/dL (ref 6–23)
CO2: 28 mEq/L (ref 19–32)
Calcium: 9.5 mg/dL (ref 8.4–10.5)
Chloride: 105 mEq/L (ref 96–112)
Creatinine, Ser: 1.19 mg/dL (ref 0.40–1.20)
GFR: 53.56 mL/min — ABNORMAL LOW (ref >60.00)
Glucose, Bld: 113 mg/dL — ABNORMAL HIGH (ref 70–99)
Potassium: 3.9 mEq/L (ref 3.5–5.1)
Sodium: 140 mEq/L (ref 135–145)

## 2018-09-03 ENCOUNTER — Encounter: Payer: Self-pay | Admitting: Family Medicine

## 2018-09-14 ENCOUNTER — Encounter: Payer: Self-pay | Admitting: Cardiology

## 2018-09-14 ENCOUNTER — Ambulatory Visit: Payer: Medicare HMO | Admitting: Cardiology

## 2018-09-14 ENCOUNTER — Telehealth: Payer: Self-pay | Admitting: *Deleted

## 2018-09-14 VITALS — BP 114/82 | HR 70 | Ht 66.0 in | Wt 211.0 lb

## 2018-09-14 DIAGNOSIS — I10 Essential (primary) hypertension: Secondary | ICD-10-CM

## 2018-09-14 DIAGNOSIS — I2510 Atherosclerotic heart disease of native coronary artery without angina pectoris: Secondary | ICD-10-CM | POA: Diagnosis not present

## 2018-09-14 DIAGNOSIS — R0683 Snoring: Secondary | ICD-10-CM | POA: Diagnosis not present

## 2018-09-14 DIAGNOSIS — E1169 Type 2 diabetes mellitus with other specified complication: Secondary | ICD-10-CM | POA: Diagnosis not present

## 2018-09-14 DIAGNOSIS — N183 Chronic kidney disease, stage 3 unspecified: Secondary | ICD-10-CM

## 2018-09-14 DIAGNOSIS — E785 Hyperlipidemia, unspecified: Secondary | ICD-10-CM | POA: Diagnosis not present

## 2018-09-14 DIAGNOSIS — E1122 Type 2 diabetes mellitus with diabetic chronic kidney disease: Secondary | ICD-10-CM | POA: Diagnosis not present

## 2018-09-14 DIAGNOSIS — R5383 Other fatigue: Secondary | ICD-10-CM | POA: Diagnosis not present

## 2018-09-14 DIAGNOSIS — R0602 Shortness of breath: Secondary | ICD-10-CM

## 2018-09-14 MED ORDER — ROSUVASTATIN CALCIUM 40 MG PO TABS: 40.0000 mg | ORAL_TABLET | Freq: Every day | ORAL | 3 refills | Status: DC

## 2018-09-14 NOTE — Telephone Encounter (Signed)
-----   Message from [name], [place] sent at 09/14/2018  2:32 PM EST ----- Pt: [name] [phone]  Pt is scheduled to have a sleep study per Daune Perch, NP  DX: Snoring

## 2018-09-14 NOTE — Patient Instructions (Addendum)
Medication Instructions:  1.) INCREASE: Rosuvastatin to 40 mg daily   If you need a refill on your cardiac medications before your next appointment, please call your pharmacy.   Lab work: TODAY: BMET , TSH & PRO BNP  Your physician recommends that you return for a FASTING lipid profile and hepatic function in 6 weeks    If you have labs (blood work) drawn today and your tests are completely normal, you will receive your results only by: [name] Kitchen MyChart Message (if you have MyChart) OR . A paper copy in the mail If you have any lab test that is abnormal or we need to change your treatment, we will call you to review the results.  Testing/Procedures: Your physician has recommended that you have a sleep study. This test records several body functions during sleep, including: brain activity, eye movement, oxygen and carbon dioxide blood levels, heart rate and rhythm, breathing rate and rhythm, the flow of air through your mouth and nose, snoring, body muscle movements, and chest and belly movement.   Follow-Up: At [facility], you and your health needs are our priority.  As part of our continuing mission to provide you with exceptional heart care, we have created designated Provider Care Teams.  These Care Teams include your primary Cardiologist (physician) and Advanced Practice Providers (APPs -  Physician Assistants and Nurse Practitioners) who all work together to provide you with the care you need, when you need it. You will need a follow up appointment in:  6 months.  Please call our office 2 months in advance to schedule this appointment.  You may see Dorris Carnes, MD or one of the following Advanced Practice Providers on your designated Care Team: Richardson Dopp, PA-C [place], [place] . Daune Perch, NP  Any Other Special Instructions Will Be Listed Below (If Applicable).   DASH Eating Plan DASH stands for "Dietary Approaches to Stop Hypertension." The DASH eating plan is a healthy  eating plan that has been shown to reduce high blood pressure (hypertension). It may also reduce your risk for type 2 diabetes, heart disease, and stroke. The DASH eating plan may also help with weight loss. What are tips for following this plan?  General guidelines  Avoid eating more than 2,300 mg (milligrams) of salt (sodium) a day. If you have hypertension, you may need to reduce your sodium intake to 1,500 mg a day.  Limit alcohol intake to no more than 1 drink a day for nonpregnant women and 2 drinks a day for men. One drink equals 12 oz of beer, 5 oz of wine, or 1 oz of hard liquor.  Work with your health care provider to maintain a healthy body weight or to lose weight. Ask what an ideal weight is for you.  Get at least 30 minutes of exercise that causes your heart to beat faster (aerobic exercise) most days of the week. Activities may include walking, swimming, or biking.  Work with your health care provider or diet and nutrition specialist (dietitian) to adjust your eating plan to your individual calorie needs. Reading food labels   Check food labels for the amount of sodium per serving. Choose foods with less than 5 percent of the Daily Value of sodium. Generally, foods with less than 300 mg of sodium per serving fit into this eating plan.  To find whole grains, look for the word "whole" as the first word in the ingredient list. Shopping  Buy products labeled as "low-sodium" or "no salt  added."  Buy fresh foods. Avoid canned foods and premade or frozen meals. Cooking  Avoid adding salt when cooking. Use salt-free seasonings or herbs instead of table salt or sea salt. Check with your health care provider or pharmacist before using salt substitutes.  Do not fry foods. Cook foods using healthy methods such as baking, boiling, grilling, and broiling instead.  Cook with heart-healthy oils, such as olive, canola, soybean, or sunflower oil. Meal planning  Eat a balanced diet  that includes: ? 5 or more servings of fruits and vegetables each day. At each meal, try to fill half of your plate with fruits and vegetables. ? Up to 6-8 servings of whole grains each day. ? Less than 6 oz of lean meat, poultry, or fish each day. A 3-oz serving of meat is about the same size as a deck of cards. One egg equals 1 oz. ? 2 servings of low-fat dairy each day. ? A serving of nuts, seeds, or beans 5 times each week. ? Heart-healthy fats. Healthy fats called Omega-3 fatty acids are found in foods such as flaxseeds and coldwater fish, like sardines, salmon, and mackerel.  Limit how much you eat of the following: ? Canned or prepackaged foods. ? Food that is high in trans fat, such as fried foods. ? Food that is high in saturated fat, such as fatty meat. ? Sweets, desserts, sugary drinks, and other foods with added sugar. ? Full-fat dairy products.  Do not salt foods before eating.  Try to eat at least 2 vegetarian meals each week.  Eat more home-cooked food and less restaurant, buffet, and fast food.  When eating at a restaurant, ask that your food be prepared with less salt or no salt, if possible. What foods are recommended? The items listed may not be a complete list. Talk with your dietitian about what dietary choices are best for you. Grains Whole-grain or whole-wheat bread. Whole-grain or whole-wheat pasta. Brown rice. [place]. Bulgur. Whole-grain and low-sodium cereals. Pita bread. Low-fat, low-sodium crackers. Whole-wheat flour tortillas. Vegetables Fresh or frozen vegetables (raw, steamed, roasted, or grilled). Low-sodium or reduced-sodium tomato and vegetable juice. Low-sodium or reduced-sodium tomato sauce and tomato paste. Low-sodium or reduced-sodium canned vegetables. Fruits All fresh, dried, or frozen fruit. Canned fruit in natural juice (without added sugar). Meat and other protein foods Skinless chicken or [place]. Ground chicken or [place]. Pork with  fat trimmed off. Fish and seafood. Egg whites. Dried beans, peas, or lentils. Unsalted nuts, nut butters, and seeds. Unsalted canned beans. Lean cuts of beef with fat trimmed off. Low-sodium, lean deli meat. Dairy Low-fat (1%) or fat-free (skim) milk. Fat-free, low-fat, or reduced-fat cheeses. Nonfat, low-sodium ricotta or cottage cheese. Low-fat or nonfat yogurt. Low-fat, low-sodium cheese. Fats and oils Soft margarine without trans fats. Vegetable oil. Low-fat, reduced-fat, or light mayonnaise and salad dressings (reduced-sodium). Canola, safflower, olive, soybean, and sunflower oils. Avocado. Seasoning and other foods Herbs. Spices. Seasoning mixes without salt. Unsalted popcorn and pretzels. Fat-free sweets. What foods are not recommended? The items listed may not be a complete list. Talk with your dietitian about what dietary choices are best for you. Grains Baked goods made with fat, such as croissants, muffins, or some breads. Dry pasta or rice meal packs. Vegetables Creamed or fried vegetables. Vegetables in a cheese sauce. Regular canned vegetables (not low-sodium or reduced-sodium). Regular canned tomato sauce and paste (not low-sodium or reduced-sodium). Regular tomato and vegetable juice (not low-sodium or reduced-sodium). Angie Fava. Olives. Fruits Canned fruit  in a light or heavy syrup. Fried fruit. Fruit in cream or butter sauce. Meat and other protein foods Fatty cuts of meat. Ribs. Fried meat. Berniece Salines. Sausage. Bologna and other processed lunch meats. Salami. Fatback. Hotdogs. Bratwurst. Salted nuts and seeds. Canned beans with added salt. Canned or smoked fish. Whole eggs or egg yolks. Chicken or [place] with skin. Dairy Whole or 2% milk, cream, and half-and-half. Whole or full-fat cream cheese. Whole-fat or sweetened yogurt. Full-fat cheese. Nondairy creamers. Whipped toppings. Processed cheese and cheese spreads. Fats and oils Butter. Stick margarine. Lard. Shortening. Ghee. Bacon  fat. Tropical oils, such as coconut, palm kernel, or palm oil. Seasoning and other foods Salted popcorn and pretzels. Onion salt, garlic salt, seasoned salt, table salt, and sea salt. Worcestershire sauce. Tartar sauce. Barbecue sauce. Teriyaki sauce. Soy sauce, including reduced-sodium. Steak sauce. Canned and packaged gravies. Fish sauce. Oyster sauce. Cocktail sauce. Horseradish that you find on the shelf. Ketchup. Mustard. Meat flavorings and tenderizers. Bouillon cubes. Hot sauce and Tabasco sauce. Premade or packaged marinades. Premade or packaged taco seasonings. Relishes. Regular salad dressings. Where to find more information:  National Heart, Lung, and [place]: https://wilson-eaton.com/  American Heart Association: www.heart.org Summary  The DASH eating plan is a healthy eating plan that has been shown to reduce high blood pressure (hypertension). It may also reduce your risk for type 2 diabetes, heart disease, and stroke.  With the DASH eating plan, you should limit salt (sodium) intake to 2,300 mg a day. If you have hypertension, you may need to reduce your sodium intake to 1,500 mg a day.  When on the DASH eating plan, aim to eat more fresh fruits and vegetables, whole grains, lean proteins, low-fat dairy, and heart-healthy fats.  Work with your health care provider or diet and nutrition specialist (dietitian) to adjust your eating plan to your individual calorie needs. This information is not intended to replace advice given to you by your health care provider. Make sure you discuss any questions you have with your health care provider. Document Released: 07/08/2011 Document Revised: 07/12/2016 Document Reviewed: 07/12/2016 Elsevier Interactive Patient Education  2019 [place] with Quitting Smoking  Quitting smoking is a physical and mental challenge. You will face cravings, withdrawal symptoms, and temptation. Before quitting, work with your health care  provider to make a plan that can help you cope. Preparation can help you quit and keep you from giving in. How can I cope with cravings? Cravings usually last for 5-10 minutes. If you get through it, the craving will pass. Consider taking the following actions to help you cope with cravings:  Keep your mouth busy: ? Chew sugar-free gum. ? Suck on hard candies or a straw. ? Brush your teeth.  Keep your hands and body busy: ? Immediately change to a different activity when you feel a craving. ? Squeeze or play with a ball. ? Do an activity or a hobby, like making bead jewelry, practicing needlepoint, or working with wood. ? Mix up your normal routine. ? Take a short exercise break. Go for a quick walk or run up and down stairs. ? Spend time in public places where smoking is not allowed.  Focus on doing something kind or helpful for someone else.  Call a friend or family member to talk during a craving.  Join a support group.  Call a quit line, such as 1-800-QUIT-NOW.  Talk with your health care provider about medicines that might help you cope  with cravings and make quitting easier for you. How can I deal with withdrawal symptoms? Your body may experience negative effects as it tries to get used to not having nicotine in the system. These effects are called withdrawal symptoms. They may include:  Feeling hungrier than normal.  Trouble concentrating.  Irritability.  Trouble sleeping.  Feeling depressed.  Restlessness and agitation.  Craving a cigarette. To manage withdrawal symptoms:  Avoid places, people, and activities that trigger your cravings.  Remember why you want to quit.  Get plenty of sleep.  Avoid coffee and other caffeinated drinks. These may worsen some of your symptoms. How can I handle social situations? Social situations can be difficult when you are quitting smoking, especially in the first few weeks. To manage this, you can:  Avoid parties, bars,  and other social situations where people might be smoking.  Avoid alcohol.  Leave right away if you have the urge to smoke.  Explain to your family and friends that you are quitting smoking. Ask for understanding and support.  Plan activities with friends or family where smoking is not an option. What are some ways I can cope with stress? Wanting to smoke may cause stress, and stress can make you want to smoke. Find ways to manage your stress. Relaxation techniques can help. For example:  Breathe slowly and deeply, in through your nose and out through your mouth.  Listen to soothing, relaxing music.  Talk with a family member or friend about your stress.  Light a candle.  Soak in a bath or take a shower.  Think about a peaceful place. What are some ways I can prevent weight gain? Be aware that many people gain weight after they quit smoking. However, not everyone does. To keep from gaining weight, have a plan in place before you quit and stick to the plan after you quit. Your plan should include:  Having healthy snacks. When you have a craving, it may help to: ? Eat plain popcorn, crunchy carrots, celery, or other cut vegetables. ? Chew sugar-free gum.  Changing how you eat: ? Eat small portion sizes at meals. ? Eat 4-6 small meals throughout the day instead of 1-2 large meals a day. ? Be mindful when you eat. Do not watch television or do other things that might distract you as you eat.  Exercising regularly: ? Make time to exercise each day. If you do not have time for a long workout, do short bouts of exercise for 5-10 minutes several times a day. ? Do some form of strengthening exercise, like weight lifting, and some form of aerobic exercise, like running or swimming.  Drinking plenty of water or other low-calorie or no-calorie drinks. Drink 6-8 glasses of water daily, or as much as instructed by your health care provider. Summary  Quitting smoking is a physical and  mental challenge. You will face cravings, withdrawal symptoms, and temptation to smoke again. Preparation can help you as you go through these challenges.  You can cope with cravings by keeping your mouth busy (such as by chewing gum), keeping your body and hands busy, and making calls to family, friends, or a helpline for people who want to quit smoking.  You can cope with withdrawal symptoms by avoiding places where people smoke, avoiding drinks with caffeine, and getting plenty of rest.  Ask your health care provider about the different ways to prevent weight gain, avoid stress, and handle social situations. This information is not intended to replace  advice given to you by your health care provider. Make sure you discuss any questions you have with your health care provider. Document Released: 07/16/2016 Document Revised: 07/16/2016 Document Reviewed: 07/16/2016 Elsevier Interactive Patient Education  2019 [employer].

## 2018-09-14 NOTE — Telephone Encounter (Signed)
-----   Message from [name], [place] sent at 09/14/2018  2:32 PM EST ----- Pt: [name] [phone]  Pt is scheduled to have a sleep study per Daune Perch, NP  DX: Snoring

## 2018-09-14 NOTE — Progress Notes (Signed)
Cardiology Office Note:    Date:  09/14/2018   ID:  [name], DOB 03-22-1946, MRN [id number]  PCP:  Blackwell, Rose G, MD  Cardiologist:  Rose Carnes, MD  Referring MD: Blackwell, Rose G, MD   Chief Complaint  Patient presents with  . Follow-up    CAD, HTN    History of Present Illness:    [name] is a 73 y.o. female with a past medical history significant for hypertension, diabetes, solitary kidney and RA.  The patient has a history of chest pain.  In 05/2017 the patient was admitted for evaluation of chest pain with cardiac CT positive FFR for ramus.  Cardiac cath showed 100% subacute lesion in the mid left circumflex and she underwent PTCA/DES.  Echo showed normal LVEF.   [name] is here today for 6 month follow up alone. Her son had an amputation in December so he moved in with her. He is also on dialysis. She cares for him. She does house work, cooking, shopping, Medical sales representative, Social research officer, government. She has occ shortness of breath with walking a distance or fast. She sometimes wakes during the night sweating and short of breath. She slows down and feels fine. She sleeps on a flat bed with 2 pillows and does not notice trouble breathing when she lays down. No chest pain/tightness/pressure with exertion or at rest. She has occ mild pedal edema. None today. She has been having the shortness of breath since at least December, she attributes to the stress of caring for her son and she had had cervical neck surgery in October.   She smokes about 5 cigarettes per day for about 50 years. She used to work in a cigarette factory.   Recent labs in January show normal renal function and potassium.  Lipid panel on 08/16/2018 show TC 177 HDL 62.4 LDL 94.  Past Medical History:  Diagnosis Date  . Allergy   . Anxiety   . Arthritis    RA (Dr. Ouida Blackwell) Bilateral hands  . C. difficile diarrhea    history of  . CAD (coronary artery disease), native coronary artery    a. Cardiac CT with + FFR for Ramus b. cath  05/2017 100% sub acute lesion to mid circumflex artery s/p DES.  . Cancer ([place])    Right kidney CA removed.   . CKD (chronic kidney disease) stage 3, GFR 30-59 ml/min ([place]) 10/11/2016   s/p R nephrectomy  . Colon polyps 2008   HYPERPLASTIC  . Costochondritis    a. Nuc Stress Test 6/16: EF 70, no scar or ischemia, low risk // b. Echo 3/16: mild conc LVH, EF 65-70, no RWMA, Gr 1 DD, mild TR  . Diabetes mellitus without complication ([place])    type 2  no meds  . Dyspnea   . Gastritis   . GERD (gastroesophageal reflux disease)   . History of echocardiogram    Echo 3/18:  Moderate LVH, EF 62-83, grade 1 diastolic dysfunction, calcified aortic valve, mild MR, moderate LAE  . History of nuclear stress test    Myoview 3/18: Mod size and intensity fixed septal defect, may be artifact.Opposite mod size and intensity lat defect, which is reversible and could represent ischemia or possibly artifact (SDS 4). LVEF 71% with normal wall motion. Intermediate risk study. >> images reviewed with Dr. Dorris Blackwell - no sig ischemia; med rx   . History of pneumonia   . Hx of cardiovascular stress test    Lexiscan Myoview 6/16:  EF 70%, no scar or ischemia; Low Risk  . Hypertension   . Neuromuscular disorder ([place])    Neruopathy in bilateral feet  . Neuropathy   . Orthostatic hypotension 05/28/2017  . Osteoarthritis   . Pneumonia   . S/P angioplasty with stent 05/27/17 to LCX with DES  05/28/2017  . Thyroid disease     Past Surgical History:  Procedure Laterality Date  . ABDOMINAL HYSTERECTOMY  1978  . ANTERIOR CERVICAL DECOMP/DISCECTOMY FUSION N/A 05/29/2018   Procedure: ANTERIOR CERVICAL DECOMPRESSION FUSION - CERVICAL FIVE-CERVICAL SIX - CERVICAL SIX-CERVICAL SEVEN;  Surgeon: Rose Larsson, MD;  Location: [facility];  Service: Neurosurgery;  Laterality: N/A;  . BACK SURGERY     x 2  . CARPAL TUNNEL RELEASE Left   . COLONOSCOPY W/ BIOPSIES AND POLYPECTOMY     Hx: of  . CORONARY STENT INTERVENTION N/A  05/27/2017   Procedure: CORONARY STENT INTERVENTION;  Surgeon: Rose Blanks, MD;  Location: [facility];  Service: Cardiovascular;  Laterality: N/A;  . ESOPHAGOGASTRODUODENOSCOPY    . HNP    . LEFT HEART CATH AND CORONARY ANGIOGRAPHY N/A 05/27/2017   Procedure: LEFT HEART CATH AND CORONARY ANGIOGRAPHY;  Surgeon: Rose Blanks, MD;  Location: [facility];  Service: Cardiovascular;  Laterality: N/A;  . LUMBAR LAMINECTOMY/DECOMPRESSION MICRODISCECTOMY Left 02/12/2013   Procedure: LUMBAR TWO THREE, LUMBAR THREE FOUR, LUMBAR FOUR FIVE  LAMINECTOMY/DECOMPRESSION MICRODISCECTOMY 3 LEVELS;  Surgeon: Rose Pitter, MD;  Location: [facility];  Service: Neurosurgery;  Laterality: Left;  . NEPHRECTOMY Right 2010   10.rcc cancer  . TOTAL KNEE ARTHROPLASTY Right    Redo    Current Medications: Current Meds  Medication Sig  . acetaminophen (TYLENOL) 500 MG tablet Take 1,000 mg by mouth every 6 (six) hours as needed for moderate pain or headache.   [name] Kitchen aspirin 81 MG chewable tablet Chew 81 mg by mouth daily.  . clopidogrel (PLAVIX) 75 MG tablet Take 1 tablet (75 mg total) by mouth daily.  . cyclobenzaprine (FLEXERIL) 10 MG tablet Take 1 tablet (10 mg total) by mouth 3 (three) times daily as needed for muscle spasms.  . diclofenac sodium (VOLTAREN) 1 % GEL Apply 2 g topically 3 (three) times daily as needed (for arm pain).  . fluticasone (FLONASE) 50 MCG/ACT nasal spray Place 1 spray into both nostrils 2 (two) times daily as needed for allergies. (Patient taking differently: Place 2 sprays into both nostrils at bedtime as needed for allergies. )  . gabapentin (NEURONTIN) 400 MG capsule Take 1 capsule (400 mg total) by mouth 2 (two) times daily. (Patient taking differently: Take 400 mg by mouth at bedtime. )  . ipratropium (ATROVENT) 0.06 % nasal spray Place 2 sprays into both nostrils 4 (four) times daily.  [name] Kitchen losartan (COZAAR) 25 MG tablet Take 1 tablet (25 mg total) by mouth  daily.  . metoprolol tartrate (LOPRESSOR) 25 MG tablet Take 1 tablet (25 mg total) by mouth 2 (two) times daily.  . Multiple Vitamin (MULTIVITAMIN) tablet Take 1 tablet by mouth daily.  . nitroGLYCERIN (NITROSTAT) 0.4 MG SL tablet Place 1 tablet (0.4 mg total) under the tongue every 5 (five) minutes as needed for chest pain.  . polyethylene glycol powder (GLYCOLAX/MIRALAX) powder Take 17 g by mouth daily. (Patient taking differently: Take 17 g by mouth every other day. )  . ranitidine (ZANTAC) 150 MG tablet Take 1 tablet (150 mg total) by mouth 2 (two) times daily.  . rosuvastatin (CRESTOR) 40  MG tablet Take 1 tablet (40 mg total) by mouth daily.  . Vitamin D, Ergocalciferol, (DRISDOL) 1.25 MG (50000 UT) CAPS capsule Take 1 capsule (50,000 Units total) by mouth every 7 (seven) days.  . [DISCONTINUED] rosuvastatin (CRESTOR) 40 MG tablet Take 0.5 tablets (20 mg total) by mouth daily at 6 PM.     Allergies:   Percocet [oxycodone-acetaminophen]; Pravachol [pravastatin sodium]; Hydrocodone; and Aspirin   Social History   Socioeconomic History  . Marital status: Divorced    Spouse name: Not on file  . Number of children: 3  . Years of education: 39  . Highest education level: Not on file  Occupational History  . Occupation: Retired    Fish farm manager: RETIRED  Social Needs  . Financial resource strain: Not on file  . Food insecurity:    Worry: Not on file    Inability: Not on file  . Transportation needs:    Medical: Not on file    Non-medical: Not on file  Tobacco Use  . Smoking status: Current Every Day Smoker    Packs/day: 0.50    Years: 15.00    Pack years: 7.50    Types: Cigarettes    Last attempt to quit: 05/27/2017    Years since quitting: 1.3  . Smokeless tobacco: Never Used  . Tobacco comment: 03/16/17  1 pk/week  Substance and Sexual Activity  . Alcohol use: No    Alcohol/week: 0.0 standard drinks  . Drug use: No  . Sexual activity: Not on file  Lifestyle  . Physical  activity:    Days per week: Not on file    Minutes per session: Not on file  . Stress: Not on file  Relationships  . Social connections:    Talks on phone: Not on file    Gets together: Not on file    Attends religious service: Not on file    Active member of club or organization: Not on file    Attends meetings of clubs or organizations: Not on file    Relationship status: Not on file  Other Topics Concern  . Not on file  Social History Narrative   Single, dgtr lives with her   Never Smoked    Alcohol use- no   Drug use-no   Regular Exercise-yes   Former Smoker- 12/2008     Family History: The patient's family history includes Cancer in her brother; Dementia in her brother and brother; Diabetes in her brother and brother; Heart disease in her father; Kidney disease in her son; Parkinsonism in her brother; Prostate cancer in her father; Rheum arthritis in her mother; Stroke in her mother. There is no history of Colon cancer, Esophageal cancer, Pancreatic cancer, or Liver disease. ROS:   Please see the history of present illness.     All other systems reviewed and are negative.  EKGs/Labs/Other Studies Reviewed:    The following studies were reviewed today:  LEFT HEART CATH AND CORONARY ANGIOGRAPHY 05/27/2017  Conclusion    Mid RCA lesion, 20 %stenosed.  A STENT SYNERGY DES 3X28 drug eluting stent was successfully placed.  Mid Cx lesion, 100 %stenosed.  Post intervention, there is a 0% residual stenosis.  Ramus lesion, 50 %stenosed.  Dist LAD lesion, 30 %stenosed.  Prox LAD to Mid LAD lesion, 20 %stenosed.  Mid LAD lesion, 20 %stenosed.   1. Mild non-obstructive disease in the LAD and RCA 2. Moderate stenosis in the distal segment of the moderate caliber intermediate branch 3. Chronic total occlusion  of the mid Circumflex artery. There is dye staining in the occluded segment suggesting sub-acute lesion. The distal segment and OM branch fills from left to left  collaterals slowly.  4. Successful PTCA/DES x 1 mid Circumflex artery  Recommendations: Will continue DAPT with ASA and Brilinta. She does not have a true ASA allergy. High dose statin. Continue beta blocker.     Echocardiogram 05/27/2017 Study Conclusions - Left ventricle: The cavity size was normal. There was mild   concentric hypertrophy. Systolic function was vigorous. The   estimated ejection fraction was in the range of 65% to 70%. Wall   motion was normal; there were no regional wall motion   abnormalities. Doppler parameters are consistent with abnormal   left ventricular relaxation (grade 1 diastolic dysfunction). - Tricuspid valve: There was trivial regurgitation.  Impressions: - Compared to the prior study, there has been no significant   interval change.  EKG:  EKG is not ordered today.    Recent Labs: 07/21/2018: Hemoglobin 13.6; Platelets 246 08/29/2018: BUN 22; Creatinine, Ser 1.19; Potassium 3.9; Sodium 140   Recent Lipid Panel    Component Value Date/Time   CHOL 177 08/16/2018 0945   TRIG 102.0 08/16/2018 0945   HDL 62.40 08/16/2018 0945   CHOLHDL 3 08/16/2018 0945   VLDL 20.4 08/16/2018 0945   LDLCALC 94 08/16/2018 0945   LDLDIRECT 171.5 12/06/2011 0812    Physical Exam:    VS:  BP 114/82   Pulse 70   Ht 5\' 6"  (1.676 m)   Wt 211 lb (95.7 kg)   SpO2 98%   BMI 34.06 kg/m     Wt Readings from Last 3 Encounters:  09/14/18 211 lb (95.7 kg)  08/16/18 215 lb 2 oz (97.6 kg)  05/25/18 219 lb 11.2 oz (99.7 kg)     Physical Exam  Constitutional: She is oriented to person, place, and time. She appears well-developed and well-nourished. No distress.  HENT:  Head: Normocephalic and atraumatic.  Neck: Normal range of motion. Neck supple. No JVD present.  Cardiovascular: Normal rate, regular rhythm, normal heart sounds and intact distal pulses. Exam reveals no gallop and no friction rub.  No murmur heard. Pulmonary/Chest: No respiratory distress. She  has no wheezes.  Few crackles in posterior bases  Abdominal: Soft. Bowel sounds are normal.  Musculoskeletal: Normal range of motion.        General: No deformity or edema.  Neurological: She is alert and oriented to person, place, and time.  Skin: Skin is warm and dry.  Psychiatric: She has a normal mood and affect. Her behavior is normal. Judgment and thought content normal.  Vitals reviewed.  ASSESSMENT:    1. Coronary artery disease involving native coronary artery of native heart without angina pectoris   2. Essential hypertension   3. Hyperlipidemia associated with type 2 diabetes mellitus ([place])   4. Type 2 diabetes mellitus with stage 3 chronic kidney disease, without long-term current use of insulin ([place])   5. CKD (chronic kidney disease) stage 3, GFR 30-59 ml/min ([facility])   6. Snoring   7. Fatigue, unspecified type   8. SOB (shortness of breath)    PLAN:    In order of problems listed above:  CAD -Status post DES to left circumflex 05/27/2017 -Patient has completed her 1 year of dual antiplatelet therapy.  She is now maintained on Plavix and aspirin.  Dyspnea on exertion -Possibly related to deconditioning and pt with increased stress of caring for her son.  -  No signs of volume overload except for few crackles in lung bases. Will order BNP and BMet.  -I am not very suspicious of CHF but if BNP elevated will give lasix for diuresis.  -We discussed pt getting back to water aerobics that she used to do at the [facility] to help with her physical conditioning as well as mental health. She is excited at the prospect and says that she will call today to arrange.  -Will also check TSH as none recently  Hypertension -Metoprolol was increased to 25 mg twice daily in 04/2018.  She continues on losartan 25 mg daily. BP well controlled.   Hyperlipidemia -On rosuvastatin 20 mg daily -Lipid panel 08/16/2018 showed LDL of 94, not to goal of <70. Increase rosuvastatin to 40 mg daily and  repeat lipids and LFTs in 6 weeks.   Diabetes type 2 -A1c 5.9 in 08/2018. Well controlled.   CKD stage III -Recent labs 08/29/2018, SCr 1.19.   Tobacco abuse -Has smoked ~5 cigarettes per day for 50 years. -Advised on cessation  Snoring -pts son has sleep apnea and says his mom may have OSA as he notes her snoring. Pt states that she does not feel rested even after she sleeps well.  -Will order sleep study. She is in agreement.    Medication Adjustments/Labs and Tests Ordered: Current medicines are reviewed at length with the patient today.  Concerns regarding medicines are outlined above. Labs and tests ordered and medication changes are outlined in the patient instructions below:  Patient Instructions  Medication Instructions:  1.) INCREASE: Rosuvastatin to 40 mg daily   If you need a refill on your cardiac medications before your next appointment, please call your pharmacy.   Lab work: TODAY: BMET , TSH & PRO BNP  Your physician recommends that you return for a FASTING lipid profile and hepatic function in 6 weeks    If you have labs (blood work) drawn today and your tests are completely normal, you will receive your results only by: [name] Kitchen MyChart Message (if you have MyChart) OR . A paper copy in the mail If you have any lab test that is abnormal or we need to change your treatment, we will call you to review the results.  Testing/Procedures: Your physician has recommended that you have a sleep study. This test records several body functions during sleep, including: brain activity, eye movement, oxygen and carbon dioxide blood levels, heart rate and rhythm, breathing rate and rhythm, the flow of air through your mouth and nose, snoring, body muscle movements, and chest and belly movement.   Follow-Up: At [facility], you and your health needs are our priority.  As part of our continuing mission to provide you with exceptional heart care, we have created designated Provider  Care Teams.  These Care Teams include your primary Cardiologist (physician) and Advanced Practice Providers (APPs -  Physician Assistants and Nurse Practitioners) who all work together to provide you with the care you need, when you need it. You will need a follow up appointment in:  6 months.  Please call our office 2 months in advance to schedule this appointment.  You may see Rose Carnes, MD or one of the following Advanced Practice Providers on your designated Care Team: Richardson Dopp, PA-C [place], [place] . Daune Perch, NP  Any Other Special Instructions Will Be Listed Below (If Applicable).   DASH Eating Plan DASH stands for "Dietary Approaches to Stop Hypertension." The DASH eating plan is a healthy  eating plan that has been shown to reduce high blood pressure (hypertension). It may also reduce your risk for type 2 diabetes, heart disease, and stroke. The DASH eating plan may also help with weight loss. What are tips for following this plan?  General guidelines  Avoid eating more than 2,300 mg (milligrams) of salt (sodium) a day. If you have hypertension, you may need to reduce your sodium intake to 1,500 mg a day.  Limit alcohol intake to no more than 1 drink a day for nonpregnant women and 2 drinks a day for men. One drink equals 12 oz of beer, 5 oz of wine, or 1 oz of hard liquor.  Work with your health care provider to maintain a healthy body weight or to lose weight. Ask what an ideal weight is for you.  Get at least 30 minutes of exercise that causes your heart to beat faster (aerobic exercise) most days of the week. Activities may include walking, swimming, or biking.  Work with your health care provider or diet and nutrition specialist (dietitian) to adjust your eating plan to your individual calorie needs. Reading food labels   Check food labels for the amount of sodium per serving. Choose foods with less than 5 percent of the Daily Value of sodium. Generally, foods with  less than 300 mg of sodium per serving fit into this eating plan.  To find whole grains, look for the word "whole" as the first word in the ingredient list. Shopping  Buy products labeled as "low-sodium" or "no salt added."  Buy fresh foods. Avoid canned foods and premade or frozen meals. Cooking  Avoid adding salt when cooking. Use salt-free seasonings or herbs instead of table salt or sea salt. Check with your health care provider or pharmacist before using salt substitutes.  Do not fry foods. Cook foods using healthy methods such as baking, boiling, grilling, and broiling instead.  Cook with heart-healthy oils, such as olive, canola, soybean, or sunflower oil. Meal planning  Eat a balanced diet that includes: ? 5 or more servings of fruits and vegetables each day. At each meal, try to fill half of your plate with fruits and vegetables. ? Up to 6-8 servings of whole grains each day. ? Less than 6 oz of lean meat, poultry, or fish each day. A 3-oz serving of meat is about the same size as a deck of cards. One egg equals 1 oz. ? 2 servings of low-fat dairy each day. ? A serving of nuts, seeds, or beans 5 times each week. ? Heart-healthy fats. Healthy fats called Omega-3 fatty acids are found in foods such as flaxseeds and coldwater fish, like sardines, salmon, and mackerel.  Limit how much you eat of the following: ? Canned or prepackaged foods. ? Food that is high in trans fat, such as fried foods. ? Food that is high in saturated fat, such as fatty meat. ? Sweets, desserts, sugary drinks, and other foods with added sugar. ? Full-fat dairy products.  Do not salt foods before eating.  Try to eat at least 2 vegetarian meals each week.  Eat more home-cooked food and less restaurant, buffet, and fast food.  When eating at a restaurant, ask that your food be prepared with less salt or no salt, if possible. What foods are recommended? The items listed may not be a complete list.  Talk with your dietitian about what dietary choices are best for you. Grains Whole-grain or whole-wheat bread. Whole-grain or whole-wheat pasta. Owens Shark  rice. Oatmeal. Quinoa. Bulgur. Whole-grain and low-sodium cereals. Pita bread. Low-fat, low-sodium crackers. Whole-wheat flour tortillas. Vegetables Fresh or frozen vegetables (raw, steamed, roasted, or grilled). Low-sodium or reduced-sodium tomato and vegetable juice. Low-sodium or reduced-sodium tomato sauce and tomato paste. Low-sodium or reduced-sodium canned vegetables. Fruits All fresh, dried, or frozen fruit. Canned fruit in natural juice (without added sugar). Meat and other protein foods Skinless chicken or [place]. Ground chicken or [place]. Pork with fat trimmed off. Fish and seafood. Egg whites. Dried beans, peas, or lentils. Unsalted nuts, nut butters, and seeds. Unsalted canned beans. Lean cuts of beef with fat trimmed off. Low-sodium, lean deli meat. Dairy Low-fat (1%) or fat-free (skim) milk. Fat-free, low-fat, or reduced-fat cheeses. Nonfat, low-sodium ricotta or cottage cheese. Low-fat or nonfat yogurt. Low-fat, low-sodium cheese. Fats and oils Soft margarine without trans fats. Vegetable oil. Low-fat, reduced-fat, or light mayonnaise and salad dressings (reduced-sodium). Canola, safflower, olive, soybean, and sunflower oils. Avocado. Seasoning and other foods Herbs. Spices. Seasoning mixes without salt. Unsalted popcorn and pretzels. Fat-free sweets. What foods are not recommended? The items listed may not be a complete list. Talk with your dietitian about what dietary choices are best for you. Grains Baked goods made with fat, such as croissants, muffins, or some breads. Dry pasta or rice meal packs. Vegetables Creamed or fried vegetables. Vegetables in a cheese sauce. Regular canned vegetables (not low-sodium or reduced-sodium). Regular canned tomato sauce and paste (not low-sodium or reduced-sodium). Regular tomato and vegetable  juice (not low-sodium or reduced-sodium). Angie Fava. Olives. Fruits Canned fruit in a light or heavy syrup. Fried fruit. Fruit in cream or butter sauce. Meat and other protein foods Fatty cuts of meat. Ribs. Fried meat. Berniece Salines. Sausage. Bologna and other processed lunch meats. Salami. Fatback. Hotdogs. Bratwurst. Salted nuts and seeds. Canned beans with added salt. Canned or smoked fish. Whole eggs or egg yolks. Chicken or [place] with skin. Dairy Whole or 2% milk, cream, and half-and-half. Whole or full-fat cream cheese. Whole-fat or sweetened yogurt. Full-fat cheese. Nondairy creamers. Whipped toppings. Processed cheese and cheese spreads. Fats and oils Butter. Stick margarine. Lard. Shortening. Ghee. Bacon fat. Tropical oils, such as coconut, palm kernel, or palm oil. Seasoning and other foods Salted popcorn and pretzels. Onion salt, garlic salt, seasoned salt, table salt, and sea salt. Worcestershire sauce. Tartar sauce. Barbecue sauce. Teriyaki sauce. Soy sauce, including reduced-sodium. Steak sauce. Canned and packaged gravies. Fish sauce. Oyster sauce. Cocktail sauce. Horseradish that you find on the shelf. Ketchup. Mustard. Meat flavorings and tenderizers. Bouillon cubes. Hot sauce and Tabasco sauce. Premade or packaged marinades. Premade or packaged taco seasonings. Relishes. Regular salad dressings. Where to find more information:  National Heart, Lung, and [place]: https://wilson-eaton.com/  American Heart Association: www.heart.org Summary  The DASH eating plan is a healthy eating plan that has been shown to reduce high blood pressure (hypertension). It may also reduce your risk for type 2 diabetes, heart disease, and stroke.  With the DASH eating plan, you should limit salt (sodium) intake to 2,300 mg a day. If you have hypertension, you may need to reduce your sodium intake to 1,500 mg a day.  When on the DASH eating plan, aim to eat more fresh fruits and vegetables, whole grains,  lean proteins, low-fat dairy, and heart-healthy fats.  Work with your health care provider or diet and nutrition specialist (dietitian) to adjust your eating plan to your individual calorie needs. This information is not intended to replace advice given to you by your health  care provider. Make sure you discuss any questions you have with your health care provider. Document Released: 07/08/2011 Document Revised: 07/12/2016 Document Reviewed: 07/12/2016 Elsevier Interactive Patient Education  2019 [place] with Quitting Smoking  Quitting smoking is a physical and mental challenge. You will face cravings, withdrawal symptoms, and temptation. Before quitting, work with your health care provider to make a plan that can help you cope. Preparation can help you quit and keep you from giving in. How can I cope with cravings? Cravings usually last for 5-10 minutes. If you get through it, the craving will pass. Consider taking the following actions to help you cope with cravings:  Keep your mouth busy: ? Chew sugar-free gum. ? Suck on hard candies or a straw. ? Brush your teeth.  Keep your hands and body busy: ? Immediately change to a different activity when you feel a craving. ? Squeeze or play with a ball. ? Do an activity or a hobby, like making bead jewelry, practicing needlepoint, or working with wood. ? Mix up your normal routine. ? Take a short exercise break. Go for a quick walk or run up and down stairs. ? Spend time in public places where smoking is not allowed.  Focus on doing something kind or helpful for someone else.  Call a friend or family member to talk during a craving.  Join a support group.  Call a quit line, such as 1-800-QUIT-NOW.  Talk with your health care provider about medicines that might help you cope with cravings and make quitting easier for you. How can I deal with withdrawal symptoms? Your body may experience negative effects as it tries to  get used to not having nicotine in the system. These effects are called withdrawal symptoms. They may include:  Feeling hungrier than normal.  Trouble concentrating.  Irritability.  Trouble sleeping.  Feeling depressed.  Restlessness and agitation.  Craving a cigarette. To manage withdrawal symptoms:  Avoid places, people, and activities that trigger your cravings.  Remember why you want to quit.  Get plenty of sleep.  Avoid coffee and other caffeinated drinks. These may worsen some of your symptoms. How can I handle social situations? Social situations can be difficult when you are quitting smoking, especially in the first few weeks. To manage this, you can:  Avoid parties, bars, and other social situations where people might be smoking.  Avoid alcohol.  Leave right away if you have the urge to smoke.  Explain to your family and friends that you are quitting smoking. Ask for understanding and support.  Plan activities with friends or family where smoking is not an option. What are some ways I can cope with stress? Wanting to smoke may cause stress, and stress can make you want to smoke. Find ways to manage your stress. Relaxation techniques can help. For example:  Breathe slowly and deeply, in through your nose and out through your mouth.  Listen to soothing, relaxing music.  Talk with a family member or friend about your stress.  Light a candle.  Soak in a bath or take a shower.  Think about a peaceful place. What are some ways I can prevent weight gain? Be aware that many people gain weight after they quit smoking. However, not everyone does. To keep from gaining weight, have a plan in place before you quit and stick to the plan after you quit. Your plan should include:  Having healthy snacks. When you have a craving, it  may help to: ? Eat plain popcorn, crunchy carrots, celery, or other cut vegetables. ? Chew sugar-free gum.  Changing how you eat: ? Eat  small portion sizes at meals. ? Eat 4-6 small meals throughout the day instead of 1-2 large meals a day. ? Be mindful when you eat. Do not watch television or do other things that might distract you as you eat.  Exercising regularly: ? Make time to exercise each day. If you do not have time for a long workout, do short bouts of exercise for 5-10 minutes several times a day. ? Do some form of strengthening exercise, like weight lifting, and some form of aerobic exercise, like running or swimming.  Drinking plenty of water or other low-calorie or no-calorie drinks. Drink 6-8 glasses of water daily, or as much as instructed by your health care provider. Summary  Quitting smoking is a physical and mental challenge. You will face cravings, withdrawal symptoms, and temptation to smoke again. Preparation can help you as you go through these challenges.  You can cope with cravings by keeping your mouth busy (such as by chewing gum), keeping your body and hands busy, and making calls to family, friends, or a helpline for people who want to quit smoking.  You can cope with withdrawal symptoms by avoiding places where people smoke, avoiding drinks with caffeine, and getting plenty of rest.  Ask your health care provider about the different ways to prevent weight gain, avoid stress, and handle social situations. This information is not intended to replace advice given to you by your health care provider. Make sure you discuss any questions you have with your health care provider. Document Released: 07/16/2016 Document Revised: 07/16/2016 Document Reviewed: 07/16/2016 Elsevier Interactive Patient Education  2019 [place], Daune Perch, NP  09/14/2018 6:49 PM    [facility]

## 2018-09-15 ENCOUNTER — Telehealth: Payer: Self-pay | Admitting: *Deleted

## 2018-09-15 LAB — BASIC METABOLIC PANEL
BUN/Creatinine Ratio: 12 (ref 12–28)
BUN: 16 mg/dL (ref 8–27)
CO2: 23 mmol/L (ref 20–29)
Calcium: 9.8 mg/dL (ref 8.7–10.3)
Chloride: 103 mmol/L (ref 96–106)
Creatinine, Ser: 1.32 mg/dL — ABNORMAL HIGH (ref 0.57–1.00)
GFR calc Af Amer: 46 mL/min/{1.73_m2} — ABNORMAL LOW (ref >59)
GFR calc non Af Amer: 40 mL/min/{1.73_m2} — ABNORMAL LOW (ref >59)
Glucose: 94 mg/dL (ref 65–99)
Potassium: 4.8 mmol/L (ref 3.5–5.2)
Sodium: 143 mmol/L (ref 134–144)

## 2018-09-15 LAB — PRO B NATRIURETIC PEPTIDE: NT-Pro BNP: 170 pg/mL (ref 0–301)

## 2018-09-15 LAB — TSH: TSH: 2.2 u[IU]/mL (ref 0.450–4.500)

## 2018-09-15 NOTE — Telephone Encounter (Signed)
PA submitted to [employer] via web portal for sleep study. 

## 2018-09-15 NOTE — Telephone Encounter (Signed)
-----   Message from [name], [place] sent at 09/14/2018  2:32 PM EST ----- Pt: [name] [phone]  Pt is scheduled to have a sleep study per Daune Perch, NP  DX: Snoring

## 2018-09-18 ENCOUNTER — Telehealth: Payer: Self-pay | Admitting: *Deleted

## 2018-09-18 NOTE — Telephone Encounter (Signed)
-----   Message from [name], [place] sent at 09/14/2018  2:32 PM EST ----- Pt: [name] [phone]  Pt is scheduled to have a sleep study per Daune Perch, NP  DX: Snoring

## 2018-09-18 NOTE — Telephone Encounter (Signed)
Staff message sent to [place] received. Ok to schedule sleep study. Auth # [id number]. Valid dates 09/18/18 to 03/17/19.

## 2018-09-27 ENCOUNTER — Other Ambulatory Visit: Payer: Self-pay | Admitting: Physician Assistant

## 2018-09-28 NOTE — Telephone Encounter (Signed)
Patient is scheduled for lab study on 10/07/18. Patient understands her sleep study will be done at [facility] sleep lab. Patient understands she will receive a sleep packet in a week or so. Patient understands to call if she does not receive the sleep packet in a timely manner.  Left detailed message on voicemail with date and time of SS and informed patient to call back to confirm or reschedule.

## 2018-09-28 NOTE — Telephone Encounter (Signed)
  Lauralee Evener, CMA  Freada Bergeron, CMA        [name] received. Ok to schedule sleep . Auth # [id number]. Valid dates 09/18/18 to 03/17/19

## 2018-09-29 ENCOUNTER — Ambulatory Visit (INDEPENDENT_AMBULATORY_CARE_PROVIDER_SITE_OTHER): Payer: Medicare HMO | Admitting: Family Medicine

## 2018-09-29 ENCOUNTER — Encounter: Payer: Self-pay | Admitting: Family Medicine

## 2018-09-29 VITALS — BP 122/78 | HR 83 | Temp 97.7°F | Resp 12 | Ht 66.0 in | Wt 212.2 lb

## 2018-09-29 DIAGNOSIS — M25551 Pain in right hip: Secondary | ICD-10-CM | POA: Diagnosis not present

## 2018-09-29 DIAGNOSIS — R69 Illness, unspecified: Secondary | ICD-10-CM | POA: Diagnosis not present

## 2018-09-29 DIAGNOSIS — M48062 Spinal stenosis, lumbar region with neurogenic claudication: Secondary | ICD-10-CM | POA: Diagnosis not present

## 2018-09-29 DIAGNOSIS — F17200 Nicotine dependence, unspecified, uncomplicated: Secondary | ICD-10-CM | POA: Diagnosis not present

## 2018-09-29 DIAGNOSIS — M059 Rheumatoid arthritis with rheumatoid factor, unspecified: Secondary | ICD-10-CM

## 2018-09-29 MED ORDER — NICOTINE 14 MG/24HR TD PT24: 14.0000 mg | MEDICATED_PATCH | Freq: Every day | TRANSDERMAL | 1 refills | Status: DC

## 2018-09-29 NOTE — Patient Instructions (Addendum)
A few things to remember from today's visit:   Pain of right hip joint  Rheumatoid arthritis with positive rheumatoid factor, involving unspecified site [facility])  Spinal stenosis, lumbar region, with neurogenic claudication  So as we discussed in the office, he will let me know which rheumatologist she would like to see.  He also will try to schedule an appointment with your neurosurgeon and let me know if you need a referral.    Please be sure medication list is accurate. If a new problem present, please set up appointment sooner than planned today.

## 2018-09-29 NOTE — Assessment & Plan Note (Signed)
We discussed adverse effects of tobacco use and benefits of smoking cessation. After discussing some pharmacologic options, she would like to try nicotine. We discussed some side effects.

## 2018-09-29 NOTE — Assessment & Plan Note (Signed)
Hip pain and right lower extremity pain could be related to this problem. She is currently following with neurosurgeon, so she prefers to hold on orthopedist referral. She is planning on calling neurosurgeon to arrange appointment.  She will let me know if she needs a referral.

## 2018-09-29 NOTE — Assessment & Plan Note (Signed)
She is not sure which rheumatologist she has seen. I gave her some information about rheumatologists in the area, she will let me know which when she would like to see.

## 2018-09-29 NOTE — Progress Notes (Signed)
HPI:  Chief Complaint  Patient presents with  . Discuss referral for orthopedic    having pain in right hip that radiates down leg    [name] is a 73 y.o. female, who is here today complaining of right hip pain radiated to right ankle. Problem started 5 days ago, the day before she had a severe lower back pain, no radiated, now resolved. She continues having intermittent right "bad" hip and leg. She has not identified any exacerbating or alleviating factors. Some times it happens while she is in bed.  She has history of polyneuropathy, feet numbness seems to be getting worse. Negative for numbness or tingling along RL extremity.  She follows with neurosurgeon and currently she is on gabapentin 400 mg twice daily. She has not noted focal weakness.  She is also requesting a referral to a different rheumatologist. History of RA with negative rheumatoid factor and positive ANA. Generalized arthralgias. Currently she is not on treatment. According to patient, her rheumatologist here in [place] referred here to a different provider at [place]. She feels like they are "not doing anything for" her, it is also difficult for her to keep appointments due to distance.  Tobacco use, resumed smoking due to stress at home, her son has been having health issues. She is smoking about 40 cigarettes/day. She is interested in quitting. History of CAD, hyperlipidemia, and DM 2.   Review of Systems  Constitutional: Negative for chills and fever.  Respiratory: Negative for shortness of breath and wheezing.   Cardiovascular: Negative for chest pain and leg swelling.  Gastrointestinal: Negative for abdominal pain, nausea and vomiting.       No changes in bowel habits.  Genitourinary: Negative for decreased urine volume, dysuria and hematuria.  Musculoskeletal: Positive for arthralgias, back pain and myalgias. Negative for joint swelling.  Skin: Negative for color  change and pallor.  Neurological: Positive for numbness. Negative for weakness.  Psychiatric/Behavioral: The patient is nervous/anxious.       Current Outpatient Medications on File Prior to Visit  Medication Sig Dispense Refill  . acetaminophen (TYLENOL) 500 MG tablet Take 1,000 mg by mouth every 6 (six) hours as needed for moderate pain or headache.     [name] Kitchen aspirin 81 MG chewable tablet Chew 81 mg by mouth daily.    . clopidogrel (PLAVIX) 75 MG tablet TAKE ONE TABLET BY MOUTH ONE TIME DAILY  90 tablet 3  . diclofenac sodium (VOLTAREN) 1 % GEL Apply 2 g topically 3 (three) times daily as needed (for arm pain).    . fluticasone (FLONASE) 50 MCG/ACT nasal spray Place 1 spray into both nostrils 2 (two) times daily as needed for allergies. (Patient taking differently: Place 2 sprays into both nostrils at bedtime as needed for allergies. ) 32 g 7  . gabapentin (NEURONTIN) 400 MG capsule Take 1 capsule (400 mg total) by mouth 2 (two) times daily. (Patient taking differently: Take 400 mg by mouth at bedtime. ) 60 capsule 12  . ipratropium (ATROVENT) 0.06 % nasal spray Place 2 sprays into both nostrils 4 (four) times daily. 15 mL 2  . losartan (COZAAR) 25 MG tablet Take 1 tablet (25 mg total) by mouth daily. 90 tablet 2  . metoprolol tartrate (LOPRESSOR) 25 MG tablet Take 1 tablet (25 mg total) by mouth 2 (two) times daily. 180 tablet 3  . Multiple Vitamin (MULTIVITAMIN) tablet Take 1 tablet by mouth daily.    . nitroGLYCERIN (NITROSTAT)  0.4 MG SL tablet Place 1 tablet (0.4 mg total) under the tongue every 5 (five) minutes as needed for chest pain. 30 tablet 6  . polyethylene glycol powder (GLYCOLAX/MIRALAX) powder Take 17 g by mouth daily. (Patient taking differently: Take 17 g by mouth every other day. ) 500 g 3  . ranitidine (ZANTAC) 150 MG tablet Take 1 tablet (150 mg total) by mouth 2 (two) times daily. 180 tablet 2  . rosuvastatin (CRESTOR) 40 MG tablet Take 1 tablet (40 mg total) by mouth daily.  90 tablet 3  . Vitamin D, Ergocalciferol, (DRISDOL) 1.25 MG (50000 UT) CAPS capsule Take 1 capsule (50,000 Units total) by mouth every 7 (seven) days. 8 capsule 0   No current facility-administered medications on file prior to visit.      Past Medical History:  Diagnosis Date  . Allergy   . Anxiety   . Arthritis    RA (Dr. Ouida Sills) Bilateral hands  . C. difficile diarrhea    history of  . CAD (coronary artery disease), native coronary artery    a. Cardiac CT with + FFR for Ramus b. cath 05/2017 100% sub acute lesion to mid circumflex artery s/p DES.  . Cancer ([place])    Right kidney CA removed.   . CKD (chronic kidney disease) stage 3, GFR 30-59 ml/min ([place]) 10/11/2016   s/p R nephrectomy  . Colon polyps 2008   HYPERPLASTIC  . Costochondritis    a. Nuc Stress Test 6/16: EF 70, no scar or ischemia, low risk // b. Echo 3/16: mild conc LVH, EF 65-70, no RWMA, Gr 1 DD, mild TR  . Diabetes mellitus without complication ([place])    type 2  no meds  . Dyspnea   . Gastritis   . GERD (gastroesophageal reflux disease)   . History of echocardiogram    Echo 3/18:  Moderate LVH, EF 16-10, grade 1 diastolic dysfunction, calcified aortic valve, mild MR, moderate LAE  . History of nuclear stress test    Myoview 3/18: Mod size and intensity fixed septal defect, may be artifact.Opposite mod size and intensity lat defect, which is reversible and could represent ischemia or possibly artifact (SDS 4). LVEF 71% with normal wall motion. Intermediate risk study. >> images reviewed with Dr. Dorris Carnes - no sig ischemia; med rx   . History of pneumonia   . Hx of cardiovascular stress test    Lexiscan Myoview 6/16:  EF 70%, no scar or ischemia; Low Risk  . Hypertension   . Neuromuscular disorder ([place])    Neruopathy in bilateral feet  . Neuropathy   . Orthostatic hypotension 05/28/2017  . Osteoarthritis   . Pneumonia   . S/P angioplasty with stent 05/27/17 to LCX with DES  05/28/2017  . Thyroid disease     Allergies  Allergen Reactions  . Oxycodone-Acetaminophen Hives and Itching    hallucinations  . Percocet [Oxycodone-Acetaminophen] Hives, Itching and Other (See Comments)    hallucinations  . Pravastatin Sodium Other (See Comments)    Muscle aches Muscle aches  . Hydrocodone Other (See Comments)    "crazy dreams"  . Aspirin Other (See Comments)    GI upset Other reaction(s): Other (See Comments) REACTION: GI upset    Social History   Socioeconomic History  . Marital status: Divorced    Spouse name: Not on file  . Number of children: 3  . Years of education: 66  . Highest education level: Not on file  Occupational History  . Occupation:  Retired    Fish farm manager: RETIRED  Social Needs  . Financial resource strain: Not on file  . Food insecurity:    Worry: Not on file    Inability: Not on file  . Transportation needs:    Medical: Not on file    Non-medical: Not on file  Tobacco Use  . Smoking status: Current Every Day Smoker    Packs/day: 0.50    Years: 15.00    Pack years: 7.50    Types: Cigarettes    Last attempt to quit: 05/27/2017    Years since quitting: 1.3  . Smokeless tobacco: Never Used  . Tobacco comment: 03/16/17  1 pk/week  Substance and Sexual Activity  . Alcohol use: No    Alcohol/week: 0.0 standard drinks  . Drug use: No  . Sexual activity: Not on file  Lifestyle  . Physical activity:    Days per week: Not on file    Minutes per session: Not on file  . Stress: Not on file  Relationships  . Social connections:    Talks on phone: Not on file    Gets together: Not on file    Attends religious service: Not on file    Active member of club or organization: Not on file    Attends meetings of clubs or organizations: Not on file    Relationship status: Not on file  Other Topics Concern  . Not on file  Social History Narrative   Single, dgtr lives with her   Never Smoked    Alcohol use- no   Drug use-no   Regular Exercise-yes   Former Smoker-  12/2008    Vitals:   09/29/18 0954  BP: 122/78  Pulse: 83  Resp: 12  Temp: 97.7 F (36.5 C)  SpO2: 96%   Body mass index is 34.26 kg/m.  Physical Exam  Nursing note and vitals reviewed. Constitutional: She is oriented to person, place, and time. She appears well-developed. No distress.  HENT:  Head: Normocephalic and atraumatic.  Mouth/Throat: Oropharynx is clear and moist and mucous membranes are normal.  Eyes: Conjunctivae are normal.  Cardiovascular: Normal rate and regular rhythm.  No murmur heard. Pulses:      Dorsalis pedis pulses are 2+ on the right side and 2+ on the left side.  Respiratory: Effort normal and breath sounds normal. No respiratory distress.  GI: Soft. She exhibits no mass. There is no abdominal tenderness.  Musculoskeletal:        General: No edema.     Right hip: She exhibits decreased range of motion. She exhibits normal strength and no tenderness.     Lumbar back: She exhibits no tenderness and no bony tenderness.     Comments: Right hip limitation of internal and external rotation. Otherwise normal flexion and extension. No pain elicited with movement. Right knee crepitus, no significant limitation of flexion.   Neurological: She is alert and oriented to person, place, and time. She has normal strength. No cranial nerve deficit.  Stable gait with no assistance.  Skin: Skin is warm. No rash noted. No erythema.  Psychiatric: Her mood appears anxious.  Well groomed, good eye contact.    ASSESSMENT AND PLAN:  Ms. Mignon was seen today for discuss referral for orthopedic.  Diagnoses and all orders for this visit:  Pain of right hip joint We discussed possible etiologies,?  OA and/or radicular pain. She is already established with neurosurgeon, status post C5-6 and C6-7 anterior cervical discectomy with interbody allograft and  anterior plate instrumentation on 05/29/18.So instead going to orthopedist she prefers to arrange appointment with Dr.  Annette Stable.  She will let me know if a new referral is needed.  Rheumatoid arthritis ([place]) She is not sure which rheumatologist she has seen. I gave her some information about rheumatologists in the area, she will let me know which when she would like to see.  Spinal stenosis, lumbar region, with neurogenic claudication Hip pain and right lower extremity pain could be related to this problem. She is currently following with neurosurgeon, so she prefers to hold on orthopedist referral. She is planning on calling neurosurgeon to arrange appointment.  She will let me know if she needs a referral.  Tobacco use disorder We discussed adverse effects of tobacco use and benefits of smoking cessation. After discussing some pharmacologic options, she would like to try nicotine. We discussed some side effects.    Return if symptoms worsen or fail to improve, for Keep your next appointment..    Rose G. [name], MD  [facility]. [place] office.

## 2018-10-03 DIAGNOSIS — I10 Essential (primary) hypertension: Secondary | ICD-10-CM | POA: Diagnosis not present

## 2018-10-03 DIAGNOSIS — E785 Hyperlipidemia, unspecified: Secondary | ICD-10-CM | POA: Diagnosis not present

## 2018-10-03 DIAGNOSIS — M0550 Rheumatoid polyneuropathy with rheumatoid arthritis of unspecified site: Secondary | ICD-10-CM | POA: Diagnosis not present

## 2018-10-03 DIAGNOSIS — J309 Allergic rhinitis, unspecified: Secondary | ICD-10-CM | POA: Diagnosis not present

## 2018-10-03 DIAGNOSIS — R69 Illness, unspecified: Secondary | ICD-10-CM | POA: Diagnosis not present

## 2018-10-03 DIAGNOSIS — I2510 Atherosclerotic heart disease of native coronary artery without angina pectoris: Secondary | ICD-10-CM | POA: Diagnosis not present

## 2018-10-03 DIAGNOSIS — I252 Old myocardial infarction: Secondary | ICD-10-CM | POA: Diagnosis not present

## 2018-10-03 DIAGNOSIS — E1142 Type 2 diabetes mellitus with diabetic polyneuropathy: Secondary | ICD-10-CM | POA: Diagnosis not present

## 2018-10-03 DIAGNOSIS — G8929 Other chronic pain: Secondary | ICD-10-CM | POA: Diagnosis not present

## 2018-10-04 ENCOUNTER — Other Ambulatory Visit: Payer: Self-pay | Admitting: Family Medicine

## 2018-10-04 DIAGNOSIS — J069 Acute upper respiratory infection, unspecified: Secondary | ICD-10-CM

## 2018-10-06 ENCOUNTER — Other Ambulatory Visit: Payer: Self-pay | Admitting: *Deleted

## 2018-10-06 DIAGNOSIS — J069 Acute upper respiratory infection, unspecified: Secondary | ICD-10-CM

## 2018-10-06 MED ORDER — FLUTICASONE PROPIONATE 50 MCG/ACT NA SUSP: 2.0000 | Freq: Every evening | NASAL | 2 refills | Status: DC | PRN

## 2018-10-07 ENCOUNTER — Ambulatory Visit (HOSPITAL_BASED_OUTPATIENT_CLINIC_OR_DEPARTMENT_OTHER): Payer: Medicare HMO | Attending: Cardiology | Admitting: Cardiology

## 2018-10-07 DIAGNOSIS — G4733 Obstructive sleep apnea (adult) (pediatric): Secondary | ICD-10-CM

## 2018-10-07 DIAGNOSIS — R0683 Snoring: Secondary | ICD-10-CM | POA: Diagnosis not present

## 2018-10-22 NOTE — Procedures (Deleted)
   Patient Name: [name] Date: 10/10/2018 Gender: Female D.O.B: 02/21/1946 Age (years): 72 Referring Provider: Richardson Dopp Height (inches): 10 Interpreting Physician: Fransico Him MD, ABSM Weight (lbs): 221 RPSGT: Jacolyn Reedy BMI: 27 MRN: [id number] Neck Size: 17.50 CLINICAL INFORMATION Sleep Study Type: HST  Indication for sleep study: Snoring  Epworth Sleepiness Score: 7  SLEEP STUDY TECHNIQUE A multi-channel overnight portable sleep study was performed. The channels recorded were: nasal airflow, thoracic respiratory movement, and oxygen saturation with a pulse oximetry. Snoring was also monitored.  MEDICATIONS Patient self administered medications include: N/A.  SLEEP ARCHITECTURE Patient was studied for 467.5 minutes. The sleep efficiency was 100.0 % and the patient was supine for 42.6%. The arousal index was 0.0 per hour.  RESPIRATORY PARAMETERS The overall AHI was 8.6 per hour, with a central apnea index of 0.0 per hour.  The oxygen nadir was 90% during sleep.  CARDIAC DATA Mean heart rate during sleep was 69.0 bpm.  IMPRESSIONS Mild obstructive sleep apnea occurred during this study (AHI = 8.6/h). No significant central sleep apnea occurred during this study (CAI = 0.0/h). The patient had minimal or no oxygen desaturation during the study (Min O2 = 90%) Patient snored 29.8% during the sleep.  DIAGNOSIS Obstructive Sleep Apnea (327.23 [G47.33 ICD-10])  RECOMMENDATIONS Therapeutic CPAP titration to determine optimal pressure required to alleviate sleep disordered breathing. Positional therapy avoiding supine position during sleep. Surgical consultation for Uvulopalatopharyngoplasty (UPPP) may be considered. Oral appliance may be considered. Avoid alcohol, sedatives and other CNS depressants that may worsen sleep apnea and disrupt normal sleep architecture. Sleep hygiene should be reviewed to assess factors that may improve sleep quality. Weight  management and regular exercise should be initiated or continued. Return to Sleep Center to discuss the results of this study Patient may benefit from in-lab study  [Electronically signed] 10/22/2018 08:13 PM  Fransico Him MD, ABSM Diplomate, American Board of Sleep Medicine

## 2018-10-23 ENCOUNTER — Telehealth: Payer: Self-pay | Admitting: *Deleted

## 2018-10-23 NOTE — Procedures (Signed)
   Patient Name: [name], [name] Study Date:07/19/2017 10/07/2018 Gender: Female D.O.B: 06-15-1946 Age (years): 41 Referring Provider: Daune Perch NP Height (inches): 37 Interpreting Physician: Fransico Him MD, ABSM Weight (lbs): 209 RPSGT: Earney Hamburg BMI: 34 MRN: [id number] Neck Size: 16.00  CLINICAL INFORMATION Sleep Study Type: NPSG  Indication for sleep study: Snoring  Epworth Sleepiness Score: 5  SLEEP STUDY TECHNIQUE As per the AASM Manual for the Scoring of Sleep and Associated Events v2.3 (April 2016) with a hypopnea requiring 4% desaturations.  The channels recorded and monitored were frontal, central and occipital EEG, electrooculogram (EOG), submentalis EMG (chin), nasal and oral airflow, thoracic and abdominal wall motion, anterior tibialis EMG, snore microphone, electrocardiogram, and pulse oximetry.  MEDICATIONS Medications self-administered by patient taken the night of the study : N/A  SLEEP ARCHITECTURE The study was initiated at 11:09:56 PM and ended at 5:21:53 AM.  Sleep onset time was 78.3 minutes and the sleep efficiency was 62.0%. The total sleep time was 230.6 minutes.  Stage REM latency was N/A minutes.  The patient spent 3.7% of the night in stage N1 sleep, 96.3% in stage N2 sleep, 0.0% in stage N3 and 0% in REM.  Alpha intrusion was absent.  Supine sleep was 41.25%.  RESPIRATORY PARAMETERS The overall apnea/hypopnea index (AHI) was 65.8 per hour. There were 4 total apneas, including 4 obstructive, 0 central and 0 mixed apneas. There were 249 hypopneas and 58 RERAs.  The AHI during Stage REM sleep was N/A per hour.  AHI while supine was 84.5 per hour.  The mean oxygen saturation was 94.7%. The minimum SpO2 during sleep was 89.0%.  loud snoring was noted during this study.  CARDIAC DATA The 2 lead EKG demonstrated sinus rhythm. The mean heart rate was 74.4 beats per minute. Other EKG findings include: None.  LEG MOVEMENT DATA The  total PLMS were 0 with a resulting PLMS index of 0.0. Associated arousal with leg movement index was 0.3 .  IMPRESSIONS - Severe obstructive sleep apnea occurred during this study (AHI = 65.8/h). - No significant central sleep apnea occurred during this study (CAI = 0.0/h). - The patient had minimal or no oxygen desaturation during the study (Min O2 = 89.0%) - The patient snored with loud snoring volume. - No cardiac abnormalities were noted during this study. - Clinically significant periodic limb movements did not occur during sleep. No significant associated arousals.  DIAGNOSIS - Obstructive Sleep Apnea (327.23 [G47.33 ICD-10])  RECOMMENDATIONS - Therapeutic CPAP titration to determine optimal pressure required to alleviate sleep disordered breathing. - Positional therapy avoiding supine position during sleep. - Avoid alcohol, sedatives and other CNS depressants that may worsen sleep apnea and disrupt normal sleep architecture. - Sleep hygiene should be reviewed to assess factors that may improve sleep quality. - Weight management and regular exercise should be initiated or continued if appropriate.  [Electronically signed] 10/23/2018 09:36 PM  Fransico Him MD, ABSM Diplomate, American Board of Sleep Medicine

## 2018-10-23 NOTE — Telephone Encounter (Signed)
-----   Message from [name], MD sent at 10/22/2018  8:29 PM EDT ----- Patient has mild OSA.  I will call patient to discuss results and determine plan going forward in setting of [name] pandemic

## 2018-10-26 ENCOUNTER — Other Ambulatory Visit: Payer: Medicare HMO

## 2018-10-27 ENCOUNTER — Telehealth: Payer: Self-pay | Admitting: *Deleted

## 2018-10-27 DIAGNOSIS — G4733 Obstructive sleep apnea (adult) (pediatric): Secondary | ICD-10-CM

## 2018-10-27 NOTE — Telephone Encounter (Signed)
-----   Message from [name], MD sent at 10/23/2018  9:39 PM EDT ----- Please let patient know that they have sleep apnea and recommend CPAP titration. Please set up titration in the sleep lab once it is reopened from [place]

## 2018-10-27 NOTE — Telephone Encounter (Signed)
Informed patient of sleep study results and patient understanding was verbalized. Patient understands her sleep study showed  they have sleep apnea and recommend CPAP titration. Please set up titration in the sleep lab once it is reopened from COVID   Titration sent to precert

## 2018-11-01 ENCOUNTER — Telehealth: Payer: Self-pay

## 2018-11-01 NOTE — Telephone Encounter (Signed)
Spoke with the patient, she is able to schedule a video visit with Dr. Radford Pax. Will call later to confirm appointment time.

## 2018-11-03 NOTE — Telephone Encounter (Signed)
Follow up     Patient is calling in reference to setting up for virtual visit.

## 2018-11-03 NOTE — Telephone Encounter (Signed)
TELEPHONE CALL NOTE  This patient has been deemed a candidate for follow-up tele-health visit to limit community exposure during the Covid-19 pandemic. I spoke with the patient via phone to discuss instructions. This has been outlined on the patient's AVS (dotphrase: hcevisitinfo). The patient was advised to review the section on consent for treatment as well. The patient will receive a phone call 2-3 days prior to their E-Visit at which time consent will be verbally confirmed. A Virtual Office Visit appointment type has been scheduled for 8:20 am on 11/10/18 with Dr. Radford Pax, with "VIDEO" or "TELEPHONE" in the appointment notes - patient prefers  type.  I have either confirmed the patient is active in [place] or offered to send sign-up link to phone/email via Mychart icon beside patient's photo  Sarina Ill, RN 11/03/2018 10:01 AM   TELEPHONE CALL NOTE  [name] has been deemed a candidate for a follow-up tele-health visit to limit community exposure during the Covid-19 pandemic. I spoke with the patient via phone to ensure availability of phone/video source, confirm preferred email & phone number, and discuss instructions and expectations.  I reminded [name] to be prepared with any vital sign and/or heart rhythm information that could potentially be obtained via home monitoring, at the time of her visit. I reminded [name] to expect a phone call at the time of her visit if her visit.  Did the patient verbally acknowledge consent to treatment? [place], RN 11/03/2018 10:02 AM  CONSENT FOR TELE-HEALTH VISIT - PLEASE REVIEW  I hereby voluntarily request, consent and authorize [facility] and its employed or contracted physicians, physician assistants, nurse practitioners or other licensed health care professionals (the Practitioner), to provide me with telemedicine health care services (the "Services") as deemed necessary by the treating Practitioner. I  acknowledge and consent to receive the Services by the Practitioner via telemedicine. I understand that the telemedicine visit will involve communicating with the Practitioner through live audiovisual communication technology and the disclosure of certain medical information by electronic transmission. I acknowledge that I have been given the opportunity to request an in-person assessment or other available alternative prior to the telemedicine visit and am voluntarily participating in the telemedicine visit.  I understand that I have the right to withhold or withdraw my consent to the use of telemedicine in the course of my care at any time, without affecting my right to future care or treatment, and that the Practitioner or I may terminate the telemedicine visit at any time. I understand that I have the right to inspect all information obtained and/or recorded in the course of the telemedicine visit and may receive copies of available information for a reasonable fee.  I understand that some of the potential risks of receiving the Services via telemedicine include:  [name] Kitchen Delay or interruption in medical evaluation due to technological equipment failure or disruption; . Information transmitted may not be sufficient (e.g. poor resolution of images) to allow for appropriate medical decision making by the Practitioner; and/or  . In rare instances, security protocols could fail, causing a breach of personal health information.  Furthermore, I acknowledge that it is my responsibility to provide information about my medical history, conditions and care that is complete and accurate to the best of my ability. I acknowledge that Practitioner's advice, recommendations, and/or decision may be based on factors not within their control, such as incomplete or inaccurate data provided by me or distortions of diagnostic images  or specimens that may result from electronic transmissions. I understand that the practice of  medicine is not an exact science and that Practitioner makes no warranties or guarantees regarding treatment outcomes. I acknowledge that I will receive a copy of this consent concurrently upon execution via email to the email address I last provided but may also request a printed copy by calling the office of [place].    I understand that my insurance will be billed for this visit.   I have read or had this consent read to me. . I understand the contents of this consent, which adequately explains the benefits and risks of the Services being provided via telemedicine.  . I have been provided ample opportunity to ask questions regarding this consent and the Services and have had my questions answered to my satisfaction. . I give my informed consent for the services to be provided through the use of telemedicine in my medical care  By participating in this telemedicine visit I agree to the above.

## 2018-11-10 ENCOUNTER — Encounter: Payer: Medicare HMO | Admitting: Cardiology

## 2018-11-10 ENCOUNTER — Other Ambulatory Visit: Payer: Self-pay

## 2018-11-10 ENCOUNTER — Encounter: Payer: Self-pay | Admitting: Cardiology

## 2018-11-10 NOTE — Addendum Note (Signed)
Addended by: Freada Bergeron on: 11/10/2018 10:18 AM   Modules accepted: Orders

## 2018-11-13 NOTE — Progress Notes (Signed)
This encounter was created in error - please disregard.

## 2018-11-14 ENCOUNTER — Telehealth: Payer: Self-pay | Admitting: *Deleted

## 2018-11-14 NOTE — Telephone Encounter (Signed)
Called patient but daughter [name] answered (on [place]). I advised her that due to current COVID 19 pandemic, our office is severely reducing in person visits in order to minimize the risk to our patients and healthcare providers. We recommend to convert your appointment to a video visit. We'll take all precautions to reduce any security or privacy concerns. This will be treated like an office visit, and we will file with your insurance. [name] stated her mother has already done several webex video visits with providers, and she would be fine with [place] consenting. [name] stated the e mail may be sent to her: [email or web address]. She understands that the [employer] webex software must be downloaded and operational on the device pt plans to use for the visit.  Updated patient's EMR.  She had no questions, verbalized understanding, appreciation. Webex scheduled, e mail sent.

## 2018-11-21 ENCOUNTER — Encounter: Payer: Self-pay | Admitting: Diagnostic Neuroimaging

## 2018-11-21 ENCOUNTER — Ambulatory Visit (INDEPENDENT_AMBULATORY_CARE_PROVIDER_SITE_OTHER): Payer: Medicare HMO | Admitting: Diagnostic Neuroimaging

## 2018-11-21 ENCOUNTER — Other Ambulatory Visit: Payer: Self-pay

## 2018-11-21 DIAGNOSIS — G6289 Other specified polyneuropathies: Secondary | ICD-10-CM

## 2018-11-21 DIAGNOSIS — G5602 Carpal tunnel syndrome, left upper limb: Secondary | ICD-10-CM | POA: Diagnosis not present

## 2018-11-21 MED ORDER — GABAPENTIN 400 MG PO CAPS: 400.0000 mg | ORAL_CAPSULE | Freq: Two times a day (BID) | ORAL | 4 refills | Status: DC

## 2018-11-21 NOTE — Progress Notes (Signed)
[facility]  PATIENT: [name] DOB: [birth year]  REFERRING CLINICIAN: G Aryal HISTORY FROM: patient  REASON FOR VISIT: follow up   HISTORICAL  CHIEF COMPLAINT:  Chief Complaint  Patient presents with   Hand Pain   Numbness    HISTORY OF PRESENT ILLNESS:   UPDATE (11/21/18, VRP): Since last visit, doing worse. Has had cervical spine surgery in Oct 2019. Symptoms are stable. Severity is moderate. No alleviating or aggravating factors. Tolerating gabapentin.    UPDATE (11/14/17, VRP): Since last visit, doing worse with pain in left arm and left leg. Tolerating gabapentin 400mg  . No alleviating or aggravating factors.   UPDATE (09/19/17, MM): She states overall she is doing well.  She does have burning and tingling in the left arm that is worse at night.  She also reports she occasionally has burning in the feet.  She states the compounded cream and gabapentin has been beneficial.  She did see hand surgery for carpal tunnel but he did not feel that it was severe enough to complete surgery per the patient.  The patient also reports that she is been having neck pain.  She did see her primary care who felt that it was muscle spasms and placed on tizanidine.  She returns today for evaluation.  UPDATE 03/16/17: Since last visit, doing a little better with neuropathy cream and gabapentin. Sxs stable. Planning to get second opinion [facility] rheumatology clinic.   PRIOR HPI (12/01/16): 73 year old female with history of rheumatoid arthritis, here for evaluation of left hand numbness and pain. Patient reports numbness in bilateral hands and feet, especially left hand for past 6 months. She describes pain and tenderness in her left fifth digit radiating up to her left elbow. Patient denies any recent accidents or traumas. Patient has history of diabetes, hypertension, renal cell carcinoma, rheumatoid arthritis, currently on leflunomide. Patient presented for EMG nerve conduction study  on 10/14/16 which showed diffuse widespread axonal polyneuropathy as well as superimposed left carpal tunnel syndrome. Patient has history of right carpal tunnel syndrome status post surgery with good results.    REVIEW OF SYSTEMS: Full 14 system review of systems performed and negative with exception of: as per HPI.   ALLERGIES: Allergies  Allergen Reactions   Oxycodone-Acetaminophen Hives and Itching    hallucinations   Percocet [Oxycodone-Acetaminophen] Hives, Itching and Other (See Comments)    hallucinations   Pravastatin Sodium Other (See Comments)    Muscle aches Muscle aches   Hydrocodone Other (See Comments)    "crazy dreams"   Aspirin Other (See Comments)    GI upset Other reaction(s): Other (See Comments) REACTION: GI upset    HOME MEDICATIONS: Outpatient Medications Prior to Visit  Medication Sig Dispense Refill   acetaminophen (TYLENOL) 500 MG tablet Take 1,000 mg by mouth every 6 (six) hours as needed for moderate pain or headache.      aspirin 81 MG chewable tablet Chew 81 mg by mouth daily.     clopidogrel (PLAVIX) 75 MG tablet TAKE ONE TABLET BY MOUTH ONE TIME DAILY  90 tablet 3   diclofenac sodium (VOLTAREN) 1 % GEL Apply 2 g topically 3 (three) times daily as needed (for arm pain).     fluticasone (FLONASE) 50 MCG/ACT nasal spray Place 2 sprays into both nostrils at bedtime as needed for allergies. 32 g 2   gabapentin (NEURONTIN) 400 MG capsule Take 1 capsule (400 mg total) by mouth 2 (two) times daily. (Patient taking differently: Take 400 mg  by mouth at bedtime. ) 60 capsule 12   ipratropium (ATROVENT) 0.06 % nasal spray PLACE 2 SPRAYS INTO BOTH NOSTRILS 4 (FOUR) TIMES DAILY. 15 mL 2   losartan (COZAAR) 25 MG tablet Take 1 tablet (25 mg total) by mouth daily. 90 tablet 2   metoprolol tartrate (LOPRESSOR) 25 MG tablet Take 1 tablet (25 mg total) by mouth 2 (two) times daily. 180 tablet 3   Multiple Vitamin (MULTIVITAMIN) tablet Take 1 tablet by  mouth daily.     nicotine (NICODERM CQ - DOSED IN MG/24 HOURS) 14 mg/24hr patch Place 1 patch (14 mg total) onto the skin daily. 28 patch 1   nitroGLYCERIN (NITROSTAT) 0.4 MG SL tablet Place 1 tablet (0.4 mg total) under the tongue every 5 (five) minutes as needed for chest pain. 30 tablet 6   polyethylene glycol powder (GLYCOLAX/MIRALAX) powder Take 17 g by mouth daily. (Patient taking differently: Take 17 g by mouth every other day. ) 500 g 3   ranitidine (ZANTAC) 150 MG tablet Take 1 tablet (150 mg total) by mouth 2 (two) times daily. 180 tablet 2   rosuvastatin (CRESTOR) 40 MG tablet Take 1 tablet (40 mg total) by mouth daily. 90 tablet 3   No facility-administered medications prior to visit.     PAST MEDICAL HISTORY: Past Medical History:  Diagnosis Date   Allergy    Anxiety    Arthritis    RA (Dr. Ouida Sills) Bilateral hands   C. difficile diarrhea    history of   CAD (coronary artery disease), native coronary artery    a. Cardiac CT with + FFR for Ramus b. cath 05/2017 100% sub acute lesion to mid circumflex artery s/p DES.   Cancer [facility])    Right kidney CA removed.    CKD (chronic kidney disease) stage 3, GFR 30-59 ml/min ([place]) 10/11/2016   s/p R nephrectomy   Colon polyps 2008   HYPERPLASTIC   Costochondritis    a. Nuc Stress Test 6/16: EF 70, no scar or ischemia, low risk // b. Echo 3/16: mild conc LVH, EF 65-70, no RWMA, Gr 1 DD, mild TR   Diabetes mellitus without complication (HCC)    type 2  no meds   Dyspnea    Gastritis    GERD (gastroesophageal reflux disease)    History of echocardiogram    Echo 3/18:  Moderate LVH, EF 53-61, grade 1 diastolic dysfunction, calcified aortic valve, mild MR, moderate LAE   History of nuclear stress test    Myoview 3/18: Mod size and intensity fixed septal defect, may be artifact.Opposite mod size and intensity lat defect, which is reversible and could represent ischemia or possibly artifact (SDS 4). LVEF 71% with  normal wall motion. Intermediate risk study. >> images reviewed with Dr. Dorris Carnes - no sig ischemia; med rx    History of pneumonia    Hx of cardiovascular stress test    Lexiscan Myoview 6/16:  EF 70%, no scar or ischemia; Low Risk   Hypertension    Neuromuscular disorder ([place])    Neruopathy in bilateral feet   Neuropathy    Orthostatic hypotension 05/28/2017   Osteoarthritis    Pneumonia    S/P angioplasty with stent 05/27/17 to LCX with DES  05/28/2017   Thyroid disease     PAST SURGICAL HISTORY: Past Surgical History:  Procedure Laterality Date   ABDOMINAL HYSTERECTOMY  1978   ANTERIOR CERVICAL DECOMP/DISCECTOMY FUSION N/A 05/29/2018   Procedure: ANTERIOR CERVICAL DECOMPRESSION FUSION -  CERVICAL FIVE-CERVICAL SIX - CERVICAL SIX-CERVICAL SEVEN;  Surgeon: Earnie Larsson, MD;  Location: [facility];  Service: Neurosurgery;  Laterality: N/A;   BACK SURGERY     x 2   CARPAL TUNNEL RELEASE Left    COLONOSCOPY W/ BIOPSIES AND POLYPECTOMY     Hx: of   CORONARY STENT INTERVENTION N/A 05/27/2017   Procedure: CORONARY STENT INTERVENTION;  Surgeon: Burnell Blanks, MD;  Location: [place] CV [facility];  Service: Cardiovascular;  Laterality: N/A;   ESOPHAGOGASTRODUODENOSCOPY     HNP     LEFT HEART CATH AND CORONARY ANGIOGRAPHY N/A 05/27/2017   Procedure: LEFT HEART CATH AND CORONARY ANGIOGRAPHY;  Surgeon: Burnell Blanks, MD;  Location: [place] [facility];  Service: Cardiovascular;  Laterality: N/A;   LUMBAR LAMINECTOMY/DECOMPRESSION MICRODISCECTOMY Left 02/12/2013   Procedure: LUMBAR TWO THREE, LUMBAR THREE FOUR, LUMBAR FOUR FIVE  LAMINECTOMY/DECOMPRESSION MICRODISCECTOMY 3 LEVELS;  Surgeon: Charlie Pitter, MD;  Location: [facility];  Service: Neurosurgery;  Laterality: Left;   NEPHRECTOMY Right 2010   10.rcc cancer   TOTAL KNEE ARTHROPLASTY Right    Redo    FAMILY HISTORY: Family History  Problem Relation Age of Onset   Rheum arthritis Mother     Stroke Mother    Prostate cancer Father    Heart disease Father    Diabetes Brother    Dementia Brother    Parkinsonism Brother    Kidney disease Son    Diabetes Brother    Cancer Brother    Dementia Brother    Colon cancer Neg Hx    Esophageal cancer Neg Hx    Pancreatic cancer Neg Hx    Liver disease Neg Hx     SOCIAL HISTORY:  Social History   Socioeconomic History   Marital status: Divorced    Spouse name: Not on file   Number of children: 3   Years of education: 12   Highest education level: Not on file  Occupational History   Occupation: Retired    Fish farm manager: RETIRED  Scientist, product/process development strain: Not on file   Food insecurity:    Worry: Not on file    Inability: Not on file   Transportation needs:    Medical: Not on file    Non-medical: Not on file  Tobacco Use   Smoking status: Current Every Day Smoker    Packs/day: 0.50    Years: 15.00    Pack years: 7.50    Types: Cigarettes    Last attempt to quit: 05/27/2017    Years since quitting: 1.4   Smokeless tobacco: Never Used   Tobacco comment: 03/16/17  1 pk/week  Substance and Sexual Activity   Alcohol use: No    Alcohol/week: 0.0 standard drinks   Drug use: No   Sexual activity: Not on file  Lifestyle   Physical activity:    Days per week: Not on file    Minutes per session: Not on file   Stress: Not on file  Relationships   Social connections:    Talks on phone: Not on file    Gets together: Not on file    Attends religious service: Not on file    Active member of club or organization: Not on file    Attends meetings of clubs or organizations: Not on file    Relationship status: Not on file   Intimate partner violence:    Fear of current or ex partner: Not on file    Emotionally  abused: Not on file    Physically abused: Not on file    Forced sexual activity: Not on file  Other Topics Concern   Not on file  Social History Narrative   Single, dgtr  lives with her   Never Smoked    Alcohol use- no   Drug use-no   Regular Exercise-yes   Former Smoker- 12/2008     PHYSICAL EXAM  Video exam  GENERAL EXAM/CONSTITUTIONAL: Vitals:  There were no vitals filed for this visit. There is no height or weight on file to calculate BMI. No exam data present  Patient is in no distress; well developed, nourished and groomed; neck is supple  NEUROLOGIC: MENTAL STATUS:  No flowsheet data found.  awake, alert, oriented to person, place and time  recent and remote memory intact  normal attention and concentration  language fluent, comprehension intact, naming intact,   fund of knowledge appropriate    DIAGNOSTIC DATA (LABS, IMAGING, TESTING) - I reviewed patient records, labs, notes, testing and imaging myself where available.  Lab Results  Component Value Date   WBC 7.0 07/21/2018   HGB 13.6 07/21/2018   HCT 40.6 07/21/2018   MCV 87 07/21/2018   PLT 246 07/21/2018      Component Value Date/Time   NA 143 09/14/2018 1438   K 4.8 09/14/2018 1438   CL 103 09/14/2018 1438   CO2 23 09/14/2018 1438   GLUCOSE 94 09/14/2018 1438   GLUCOSE 113 (H) 08/29/2018 1005   BUN 16 09/14/2018 1438   CREATININE 1.32 (H) 09/14/2018 1438   CREATININE 1.18 (H) 10/01/2016 1622   CALCIUM 9.8 09/14/2018 1438   PROT 7.8 05/25/2017 0602   PROT 7.3 12/01/2016 0906   ALBUMIN 3.7 05/25/2017 0602   AST 25 05/25/2017 0602   ALT 17 05/25/2017 0602   ALKPHOS 76 05/25/2017 0602   BILITOT 0.5 05/25/2017 0602   GFRNONAA 40 (L) 09/14/2018 1438   GFRAA 46 (L) 09/14/2018 1438   Lab Results  Component Value Date   CHOL 177 08/16/2018   HDL 62.40 08/16/2018   LDLCALC 94 08/16/2018   LDLDIRECT 171.5 12/06/2011   TRIG 102.0 08/16/2018   CHOLHDL 3 08/16/2018   Lab Results  Component Value Date   HGBA1C 5.9 08/16/2018   Lab Results  Component Value Date   RJJOACZY60 630 12/01/2016   Lab Results  Component Value Date   TSH 2.200 09/14/2018     10/14/16 EMG/NCS 1. 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 2. Superimposed left median neuropathy at the wrist consistent with left carpal tunnel syndrome.  01/03/18 MRI cervical spine - C5-6 and C6-7 degenerative cord compression, particularly severe at C6-7 where there is cord T2 hyperintensity. Biforaminal impingement at both levels.     ASSESSMENT AND PLAN  73 y.o. year old female here with Rheumatoid arthritis, here for evaluation of numbness and tingling in the hands and feet, especially with tenderness and pain in the left hand and left forearm. Left hand and forearm symptoms may be related to combination of arthritis, peripheral neuropathy and left carpal tunnel syndrome.   We'll check additional lab testing to rule out other causes of neuropathy. May start treatment of pain symptoms with gabapentin and compounded neuropathy cream as well as left wrist splint for carpal tunnel syndrome. Patient would like to hold off on surgical evaluation of left carpal tunnel syndrome at this time.   Dx left hand pain: rheumatoid arthritis + axonal peripheral neuropathy (due to  rheumatoid arthritis or DMT or other causes) + left carpal tunnel syndrome  1. Axonal neuropathy   2. Left carpal tunnel syndrome     Virtual Visit via Video Note  I connected with [name] on 11/21/18 at 11:00 AM EDT by a video enabled telemedicine application and verified that I am speaking with the correct person using two identifiers.   I discussed the limitations of evaluation and management by telemedicine and the availability of in person appointments. The patient expressed understanding and agreed to proceed.   I discussed the assessment and treatment plan with the patient. The patient was provided an opportunity to ask questions and all were answered. The patient agreed with the plan and demonstrated an understanding of the instructions.   The patient was  advised to call back or seek an in-person evaluation if the symptoms worsen or if the condition fails to improve as anticipated.  I provided 15 minutes of non-face-to-face time during this encounter.    PLAN:  NEUROPATHY (due to rheumatoid arthritis or DMT or other cause) - consider increasing gabapentin to 400mg  twice a day  CERVICAL SPINE DISEASE - follow up per Dr. Georgianne Fick TUNNEL SYNDROME - gabapentin and compounded neuropathy cream as well as left wrist splint for carpal tunnel syndrome.  - patient went to hand surgery clinic --> did not get recommendation for carpal tunnel release surgery  ARTHRITIS PAIN (? seroneg RA vs crystalline arthropathy or other problem) - follow up with rheumatology clinic - may consider pain mgmt clinic in future   Meds ordered this encounter  Medications   gabapentin (NEURONTIN) 400 MG capsule    Sig: Take 1 capsule (400 mg total) by mouth 2 (two) times daily.    Dispense:  180 capsule    Refill:  4   Return in about 1 year (around 11/21/2019).    Penni Bombard, MD 8/78/6767, 20:94 AM Certified in Neurology, Neurophysiology and Neuroimaging  [facility] [address], [place], [place] [address] [phone]

## 2018-11-27 ENCOUNTER — Other Ambulatory Visit: Payer: Self-pay

## 2018-11-27 ENCOUNTER — Encounter: Payer: Self-pay | Admitting: Family Medicine

## 2018-11-27 ENCOUNTER — Ambulatory Visit (INDEPENDENT_AMBULATORY_CARE_PROVIDER_SITE_OTHER): Payer: Medicare HMO | Admitting: Family Medicine

## 2018-11-27 VITALS — Resp 12

## 2018-11-27 DIAGNOSIS — J3089 Other allergic rhinitis: Secondary | ICD-10-CM | POA: Diagnosis not present

## 2018-11-27 DIAGNOSIS — K2100 Gastro-esophageal reflux disease with esophagitis, without bleeding: Secondary | ICD-10-CM

## 2018-11-27 DIAGNOSIS — J069 Acute upper respiratory infection, unspecified: Secondary | ICD-10-CM

## 2018-11-27 MED ORDER — FAMOTIDINE 20 MG PO TABS: 20.0000 mg | ORAL_TABLET | Freq: Two times a day (BID) | ORAL | 2 refills | Status: DC | PRN

## 2018-11-27 NOTE — Assessment & Plan Note (Signed)
This problem could be contributing to her respiratory symptoms. Continue Flonase nasal spray daily for a couple weeks and days and then as needed. Continue Claritin 10 mg daily. Nasal saline irrigations several times per day and as needed. Atrovent 0.06% nasal spray added today. Follow-up as needed.

## 2018-11-27 NOTE — Assessment & Plan Note (Signed)
Pepcid 20 mg twice daily to start today. GERD precautions. For now we will hold on a PPI recommendation.

## 2018-11-27 NOTE — Progress Notes (Signed)
Virtual Visit via Video Note   I connected with [name] on 11/27/18 at  2:30 PM EDT by a video enabled telemedicine application and verified that I am speaking with the correct person using two identifiers.  Location patient: home Location provider:home office Persons participating in the virtual visit: patient, provider  I discussed the limitations of evaluation and management by telemedicine and the availability of in person appointments. She expressed understanding and agreed to proceed.   HPI: Rose Calandro is a 73 yo female with Hx of HTN.anxiety,DM II,and CAD who is concerned about subjective fever,feeling "clammy", and "little" cough and wheezing since last night. Denies dyspnea,chest pain,or palpitations. Eye and nose pruritus. Fatigue,body aches, nasal congestion,rhinorrhea, and post nasal drainage.  Frontal pressure headache,"not bad."  She took Tylenol today.  Allergic rhinitis,started Claritin 2 weeks ago. Also on Flonase nasal spray.  + Smoker.  She has been wearing her mask all the time when she goes out. She has to take her son to dialysis 3 times per week. She is not aware of any sick contact, no history of travel. BP and HR "good."  She is also complaining of heartburn. History of GERD, she was on ranitidine, which was helping.  Heartburn associated by certain food intake. She denies abdominal pain, nausea, vomiting, changes in bowel habits, or melena.  Hx of esophageal stricture, denies dysphagia.  ROS: See pertinent positives and negatives per HPI. COVID-19 screening questions: Negative for possible exposure to COVID-19. Negative for loss in the sense of smell or taste.   Past Medical History:  Diagnosis Date  . Allergy   . Anxiety   . Arthritis    RA (Dr. Ouida Sills) Bilateral hands  . C. difficile diarrhea    history of  . CAD (coronary artery disease), native coronary artery    a. Cardiac CT with + FFR for Ramus b. cath 05/2017 100% sub acute lesion  to mid circumflex artery s/p DES.  . Cancer ([place])    Right kidney CA removed.   . CKD (chronic kidney disease) stage 3, GFR 30-59 ml/min ([place]) 10/11/2016   s/p R nephrectomy  . Colon polyps 2008   HYPERPLASTIC  . Costochondritis    a. Nuc Stress Test 6/16: EF 70, no scar or ischemia, low risk // b. Echo 3/16: mild conc LVH, EF 65-70, no RWMA, Gr 1 DD, mild TR  . Diabetes mellitus without complication ([place])    type 2  no meds  . Dyspnea   . Gastritis   . GERD (gastroesophageal reflux disease)   . History of echocardiogram    Echo 3/18:  Moderate LVH, EF 00-93, grade 1 diastolic dysfunction, calcified aortic valve, mild MR, moderate LAE  . History of nuclear stress test    Myoview 3/18: Mod size and intensity fixed septal defect, may be artifact.Opposite mod size and intensity lat defect, which is reversible and could represent ischemia or possibly artifact (SDS 4). LVEF 71% with normal wall motion. Intermediate risk study. >> images reviewed with Dr. Dorris Carnes - no sig ischemia; med rx   . History of pneumonia   . Hx of cardiovascular stress test    Lexiscan Myoview 6/16:  EF 70%, no scar or ischemia; Low Risk  . Hypertension   . Neuromuscular disorder ([place])    Neruopathy in bilateral feet  . Neuropathy   . Orthostatic hypotension 05/28/2017  . Osteoarthritis   . Pneumonia   . S/P angioplasty with stent 05/27/17 to LCX with DES  05/28/2017  .  Thyroid disease     Past Surgical History:  Procedure Laterality Date  . ABDOMINAL HYSTERECTOMY  1978  . ANTERIOR CERVICAL DECOMP/DISCECTOMY FUSION N/A 05/29/2018   Procedure: ANTERIOR CERVICAL DECOMPRESSION FUSION - CERVICAL FIVE-CERVICAL SIX - CERVICAL SIX-CERVICAL SEVEN;  Surgeon: Earnie Larsson, MD;  Location: [facility];  Service: Neurosurgery;  Laterality: N/A;  . BACK SURGERY     x 2  . CARPAL TUNNEL RELEASE Left   . COLONOSCOPY W/ BIOPSIES AND POLYPECTOMY     Hx: of  . CORONARY STENT INTERVENTION N/A 05/27/2017   Procedure: CORONARY  STENT INTERVENTION;  Surgeon: Burnell Blanks, MD;  Location: [facility];  Service: Cardiovascular;  Laterality: N/A;  . ESOPHAGOGASTRODUODENOSCOPY    . HNP    . LEFT HEART CATH AND CORONARY ANGIOGRAPHY N/A 05/27/2017   Procedure: LEFT HEART CATH AND CORONARY ANGIOGRAPHY;  Surgeon: Burnell Blanks, MD;  Location: [facility];  Service: Cardiovascular;  Laterality: N/A;  . LUMBAR LAMINECTOMY/DECOMPRESSION MICRODISCECTOMY Left 02/12/2013   Procedure: LUMBAR TWO THREE, LUMBAR THREE FOUR, LUMBAR FOUR FIVE  LAMINECTOMY/DECOMPRESSION MICRODISCECTOMY 3 LEVELS;  Surgeon: Charlie Pitter, MD;  Location: [facility];  Service: Neurosurgery;  Laterality: Left;  . NEPHRECTOMY Right 2010   10.rcc cancer  . TOTAL KNEE ARTHROPLASTY Right    Redo    Family History  Problem Relation Age of Onset  . Rheum arthritis Mother   . Stroke Mother   . Prostate cancer Father   . Heart disease Father   . Diabetes Brother   . Dementia Brother   . Parkinsonism Brother   . Kidney disease Son   . Diabetes Brother   . Cancer Brother   . Dementia Brother   . Colon cancer Neg Hx   . Esophageal cancer Neg Hx   . Pancreatic cancer Neg Hx   . Liver disease Neg Hx     Social History   Socioeconomic History  . Marital status: Divorced    Spouse name: Not on file  . Number of children: 3  . Years of education: 52  . Highest education level: Not on file  Occupational History  . Occupation: Retired    Fish farm manager: RETIRED  Social Needs  . Financial resource strain: Not on file  . Food insecurity:    Worry: Not on file    Inability: Not on file  . Transportation needs:    Medical: Not on file    Non-medical: Not on file  Tobacco Use  . Smoking status: Current Every Day Smoker    Packs/day: 0.50    Years: 15.00    Pack years: 7.50    Types: Cigarettes    Last attempt to quit: 05/27/2017    Years since quitting: 1.5  . Smokeless tobacco: Never Used  . Tobacco comment: 03/16/17   1 pk/week  Substance and Sexual Activity  . Alcohol use: No    Alcohol/week: 0.0 standard drinks  . Drug use: No  . Sexual activity: Not on file  Lifestyle  . Physical activity:    Days per week: Not on file    Minutes per session: Not on file  . Stress: Not on file  Relationships  . Social connections:    Talks on phone: Not on file    Gets together: Not on file    Attends religious service: Not on file    Active member of club or organization: Not on file    Attends meetings of clubs or organizations: Not  on file    Relationship status: Not on file  . Intimate partner violence:    Fear of current or ex partner: Not on file    Emotionally abused: Not on file    Physically abused: Not on file    Forced sexual activity: Not on file  Other Topics Concern  . Not on file  Social History Narrative   Single, dgtr lives with her   Never Smoked    Alcohol use- no   Drug use-no   Regular Exercise-yes   Former Smoker- 12/2008      Current Outpatient Medications:  .  acetaminophen (TYLENOL) 500 MG tablet, Take 1,000 mg by mouth every 6 (six) hours as needed for moderate pain or headache. , Disp: , Rfl:  .  aspirin 81 MG chewable tablet, Chew 81 mg by mouth daily., Disp: , Rfl:  .  clopidogrel (PLAVIX) 75 MG tablet, TAKE ONE TABLET BY MOUTH ONE TIME DAILY , Disp: 90 tablet, Rfl: 3 .  diclofenac sodium (VOLTAREN) 1 % GEL, Apply 2 g topically 3 (three) times daily as needed (for arm pain)., Disp: , Rfl:  .  famotidine (PEPCID) 20 MG tablet, Take 1 tablet (20 mg total) by mouth 2 (two) times daily as needed for heartburn or indigestion., Disp: 60 tablet, Rfl: 2 .  fluticasone (FLONASE) 50 MCG/ACT nasal spray, Place 2 sprays into both nostrils at bedtime as needed for allergies., Disp: 32 g, Rfl: 2 .  gabapentin (NEURONTIN) 400 MG capsule, Take 1 capsule (400 mg total) by mouth 2 (two) times daily., Disp: 180 capsule, Rfl: 4 .  ipratropium (ATROVENT) 0.06 % nasal spray, PLACE 2 SPRAYS  INTO BOTH NOSTRILS 4 (FOUR) TIMES DAILY., Disp: 15 mL, Rfl: 2 .  losartan (COZAAR) 25 MG tablet, Take 1 tablet (25 mg total) by mouth daily., Disp: 90 tablet, Rfl: 2 .  metoprolol tartrate (LOPRESSOR) 25 MG tablet, Take 1 tablet (25 mg total) by mouth 2 (two) times daily., Disp: 180 tablet, Rfl: 3 .  Multiple Vitamin (MULTIVITAMIN) tablet, Take 1 tablet by mouth daily., Disp: , Rfl:  .  nicotine (NICODERM CQ - DOSED IN MG/24 HOURS) 14 mg/24hr patch, Place 1 patch (14 mg total) onto the skin daily., Disp: 28 patch, Rfl: 1 .  nitroGLYCERIN (NITROSTAT) 0.4 MG SL tablet, Place 1 tablet (0.4 mg total) under the tongue every 5 (five) minutes as needed for chest pain., Disp: 30 tablet, Rfl: 6 .  polyethylene glycol powder (GLYCOLAX/MIRALAX) powder, Take 17 g by mouth daily. (Patient taking differently: Take 17 g by mouth every other day. ), Disp: 500 g, Rfl: 3 .  rosuvastatin (CRESTOR) 40 MG tablet, Take 1 tablet (40 mg total) by mouth daily., Disp: 90 tablet, Rfl: 3  EXAM:  VITALS per patient if applicable:Resp 12   GENERAL: alert, oriented, appears well and in no acute distress  HEENT: atraumatic, conjunctiva clear, no obvious facial abnormalities on inspection.  LUNGS: on inspection no signs of respiratory distress, breathing rate appears normal, no obvious gross SOB, gasping or wheezing  CV: no obvious cyanosis  Rose: moves all visible extremities without noticeable abnormality  PSYCH/NEURO: pleasant and cooperative, no obvious depression or anxiety, speech and thought processing grossly intact  ASSESSMENT AND PLAN:  Discussed the following assessment and plan:  URI, acute Most likely viral etiology. Because problem just started last night instructed to monitor for new symptoms. Recommend checking temperature daily and monitor for worsening cough, dyspnea, wheezing among other worrisome symptoms Continue Tylenol 500  mg 3 times daily as needed, after checking temperature. Adequate  hydration and rest. Clearly instructed about warning signs.   Allergic rhinitis This problem could be contributing to her respiratory symptoms. Continue Flonase nasal spray daily for a couple weeks and days and then as needed. Continue Claritin 10 mg daily. Nasal saline irrigations several times per day and as needed. Atrovent 0.06% nasal spray added today. Follow-up as needed.  GERD Pepcid 20 mg twice daily to start today. GERD precautions. For now we will hold on a PPI recommendation.      I discussed the assessment and treatment plan with the patient. She was provided an opportunity to ask questions and all were answered. The patient agreed with the plan and demonstrated an understanding of the instructions.   She was advised to call back or seek an in-person evaluation if the symptoms worsen or if the condition fails to improve as anticipated.  Return if symptoms worsen or fail to improve.    Nadeem Romanoski [name], MD

## 2018-11-28 NOTE — Progress Notes (Signed)
Pt will need CPAP titration after current pandemic distancing eases  If not already done

## 2018-11-29 ENCOUNTER — Telehealth: Payer: Self-pay | Admitting: Family Medicine

## 2018-11-29 NOTE — Telephone Encounter (Signed)
Tried to reach Ms Hartfield to follow on last visit,when she was c/o some respiratory symptoms. Left message. Will try later.  Laquasia Pincus [name], MD

## 2018-11-30 ENCOUNTER — Telehealth: Payer: Self-pay | Admitting: *Deleted

## 2018-11-30 NOTE — Telephone Encounter (Signed)
Copied from [place] [id number]. Topic: General - Other >> Nov 30, 2018 10:50 AM Rainey Pines A wrote: Reason for CRM:  Patient is returning Dr. Morrison Old call. Patient stated that she is still having congestion but no fever and will await her callback.

## 2018-11-30 NOTE — Telephone Encounter (Signed)
Message sent to Dr. Jordan for review. 

## 2018-12-01 NOTE — Telephone Encounter (Signed)
Spoke with Ms. Pogosyan. She is reporting great improvement of symptoms. She is not having headache, sore throat, cough, dyspnea, or wheezing. She still has some nasal congestion and rhinorrhea. She is checking temperature and has not had fever, 97.5 F.  Recommend continuing with nasal saline irrigations and monitoring for warning signs. She voices understanding and agrees with plan.  [name], MD

## 2018-12-07 NOTE — Progress Notes (Signed)
Please arrange for pt to be set up for CPAP

## 2018-12-14 ENCOUNTER — Telehealth: Payer: Self-pay | Admitting: *Deleted

## 2018-12-14 NOTE — Telephone Encounter (Signed)
Patient is scheduled for titration on 01/10/19. She has not been scheduled for her cpap set up.

## 2018-12-14 NOTE — Telephone Encounter (Signed)
-----   Message from [name], MD sent at 12/07/2018  1:41 PM EDT ----- Nevin Bloodgood she is already being set up for CPAP - they are seeing one patient in the office at a time to set up pap  Traci ----- Message ----- From: Sarina Ill, RN Sent: 12/07/2018   1:16 PM EDT To: [name], MD   ----- Message ----- From: Rodman Key, RN Sent: 12/07/2018  12:44 PM EDT To: Sarina Ill, RN  Pt sees turner for sleep? ----- Message ----- From: Fay Records, MD Sent: 11/28/2018   9:58 PM EDT To: Rodman Key, RN    ----- Message ----- From: [name], MD Sent: 10/23/2018   9:39 PM EDT To: Fay Records, MD

## 2018-12-21 NOTE — Telephone Encounter (Signed)
RE: precert [name], MD  Freada Bergeron, CMA        It needs to be done in lab due to severity of OSA.   Traci      From: Freada Bergeron, CMA  To: [name], MD  Subject: FW: precert                    Dr Radford Pax to clarify you DO NOT want a APAP you want a cpap titration on patient? Thanks  From: Lauralee Evener, CMA   To: Freada Bergeron, CMA  Subject: RE: precert                     ----- Message -----  From: [name], MD   To: Lauralee Evener, CMA  Subject: RE: precert                    Yes but has to be COVID tested first   Traci  ----- Message -----  From: Lauralee Evener, CMA   Subject: RE: precert                    Under the new rule can patient do APAP?   To: [facility] Sleep Studies  Subject: precert                      recommend CPAP titration

## 2018-12-21 NOTE — Telephone Encounter (Signed)
cpap titration sent to precert

## 2018-12-22 ENCOUNTER — Telehealth: Payer: Self-pay | Admitting: *Deleted

## 2018-12-22 NOTE — Telephone Encounter (Signed)
Staff message sent to [name] ok to schedule CPAP titration study. [name] # [id number]. Valid dates 12/22/18 to 06/20/19.

## 2018-12-22 NOTE — Telephone Encounter (Signed)
-----   Message from Freada Bergeron, [place] sent at 10/27/2018 12:03 PM EDT ----- Regarding: precert  recommend CPAP titration

## 2018-12-22 NOTE — Telephone Encounter (Signed)
PA submitted to [employer] for CPAP titration.

## 2018-12-26 ENCOUNTER — Other Ambulatory Visit: Payer: Self-pay | Admitting: Family Medicine

## 2018-12-28 ENCOUNTER — Other Ambulatory Visit: Payer: Medicare HMO

## 2018-12-29 ENCOUNTER — Encounter: Payer: Self-pay | Admitting: Family Medicine

## 2018-12-29 ENCOUNTER — Other Ambulatory Visit: Payer: Self-pay

## 2018-12-29 ENCOUNTER — Ambulatory Visit (INDEPENDENT_AMBULATORY_CARE_PROVIDER_SITE_OTHER): Payer: Medicare HMO | Admitting: Family Medicine

## 2018-12-29 VITALS — BP 140/80 | HR 74 | Resp 16

## 2018-12-29 DIAGNOSIS — I10 Essential (primary) hypertension: Secondary | ICD-10-CM

## 2018-12-29 DIAGNOSIS — M25551 Pain in right hip: Secondary | ICD-10-CM | POA: Diagnosis not present

## 2018-12-29 MED ORDER — PREDNISONE 20 MG PO TABS: ORAL_TABLET | ORAL | 0 refills | Status: AC

## 2018-12-29 NOTE — Progress Notes (Addendum)
Virtual Visit via Video Note   I connected with [name] on 12/30/18 at  3:00 PM EDT by a video enabled telemedicine application and verified that I am speaking with the correct person using two identifiers.  Location patient: home Location provider:work or home office Persons participating in the virtual visit: patient, provider  I discussed the limitations of evaluation and management by telemedicine and the availability of in person appointments. The patient expressed understanding and agreed to proceed.   HPI: [name] is a 73 yo ith Hx of cervicalgia,generalized OA,RA,and CAD among some c/o a week of right hip pain. Initially intermittently and now constant. Lateral aspect of right hip radiated down to foot. Sharp/dull pain,"numb like pain" 9/10, exacerbated by movement and lying on right side.  She has not noted lower back pain (has had it before), lateral right hip pain. Negative for fever,chills,saddle anesthesia,skin rash,changes in urinary or bowel continence. Hx of peripheral neuropathy,no new numbness or tingling. No Hx of trauma. She not has taken OTC analgesics.  She has not seen rheumatologist in months,trying to establish with new provider.  BP has also been elevated, she attributes this to stress;today her son was taken to the ER because Rose changes. She has not noted headache,chest pain, palpitation,or dyspnea.  States that BP is usually < 140/90. She is on Losartan 25 mg daily and Metoprolol tartrate 25 mg bid. Lab Results  Component Value Date   CREATININE 1.32 (H) 09/14/2018   BUN 16 09/14/2018   NA 143 09/14/2018   K 4.8 09/14/2018   CL 103 09/14/2018   CO2 23 09/14/2018     ROS: See pertinent positives and negatives per HPI.  Past Medical History:  Diagnosis Date  . Allergy   . Anxiety   . Arthritis    RA (Dr. Ouida Sills) Bilateral hands  . C. difficile diarrhea    history of  . CAD (coronary artery disease), native coronary artery    a. Cardiac  CT with + FFR for Ramus b. cath 05/2017 100% sub acute lesion to mid circumflex artery s/p DES.  . Cancer ([place])    Right kidney CA removed.   . CKD (chronic kidney disease) stage 3, GFR 30-59 ml/min ([place]) 10/11/2016   s/p R nephrectomy  . Colon polyps 2008   HYPERPLASTIC  . Costochondritis    a. Nuc Stress Test 6/16: EF 70, no scar or ischemia, low risk // b. Echo 3/16: mild conc LVH, EF 65-70, no RWMA, Gr 1 DD, mild TR  . Diabetes mellitus without complication ([place])    type 2  no meds  . Dyspnea   . Gastritis   . GERD (gastroesophageal reflux disease)   . History of echocardiogram    Echo 3/18:  Moderate LVH, EF 08-65, grade 1 diastolic dysfunction, calcified aortic valve, mild MR, moderate LAE  . History of nuclear stress test    Myoview 3/18: Mod size and intensity fixed septal defect, may be artifact.Opposite mod size and intensity lat defect, which is reversible and could represent ischemia or possibly artifact (SDS 4). LVEF 71% with normal wall motion. Intermediate risk study. >> images reviewed with Dr. Dorris Carnes - no sig ischemia; med rx   . History of pneumonia   . Hx of cardiovascular stress test    Lexiscan Myoview 6/16:  EF 70%, no scar or ischemia; Low Risk  . Hypertension   . Neuromuscular disorder ([place])    Neruopathy in bilateral feet  . Neuropathy   .  Orthostatic hypotension 05/28/2017  . Osteoarthritis   . Pneumonia   . S/P angioplasty with stent 05/27/17 to LCX with DES  05/28/2017  . Thyroid disease     Past Surgical History:  Procedure Laterality Date  . ABDOMINAL HYSTERECTOMY  1978  . ANTERIOR CERVICAL DECOMP/DISCECTOMY FUSION N/A 05/29/2018   Procedure: ANTERIOR CERVICAL DECOMPRESSION FUSION - CERVICAL FIVE-CERVICAL SIX - CERVICAL SIX-CERVICAL SEVEN;  Surgeon: Earnie Larsson, MD;  Location: [facility];  Service: Neurosurgery;  Laterality: N/A;  . BACK SURGERY     x 2  . CARPAL TUNNEL RELEASE Left   . COLONOSCOPY W/ BIOPSIES AND POLYPECTOMY     Hx: of  .  CORONARY STENT INTERVENTION N/A 05/27/2017   Procedure: CORONARY STENT INTERVENTION;  Surgeon: Burnell Blanks, MD;  Location: [facility];  Service: Cardiovascular;  Laterality: N/A;  . ESOPHAGOGASTRODUODENOSCOPY    . HNP    . LEFT HEART CATH AND CORONARY ANGIOGRAPHY N/A 05/27/2017   Procedure: LEFT HEART CATH AND CORONARY ANGIOGRAPHY;  Surgeon: Burnell Blanks, MD;  Location: [facility];  Service: Cardiovascular;  Laterality: N/A;  . LUMBAR LAMINECTOMY/DECOMPRESSION MICRODISCECTOMY Left 02/12/2013   Procedure: LUMBAR TWO THREE, LUMBAR THREE FOUR, LUMBAR FOUR FIVE  LAMINECTOMY/DECOMPRESSION MICRODISCECTOMY 3 LEVELS;  Surgeon: Charlie Pitter, MD;  Location: [facility];  Service: Neurosurgery;  Laterality: Left;  . NEPHRECTOMY Right 2010   10.rcc cancer  . TOTAL KNEE ARTHROPLASTY Right    Redo    Family History  Problem Relation Age of Onset  . Rheum arthritis Mother   . Stroke Mother   . Prostate cancer Father   . Heart disease Father   . Diabetes Brother   . Dementia Brother   . Parkinsonism Brother   . Kidney disease Son   . Diabetes Brother   . Cancer Brother   . Dementia Brother   . Colon cancer Neg Hx   . Esophageal cancer Neg Hx   . Pancreatic cancer Neg Hx   . Liver disease Neg Hx     Social History   Socioeconomic History  . Marital status: Divorced    Spouse name: Not on file  . Number of children: 3  . Years of education: 66  . Highest education level: Not on file  Occupational History  . Occupation: Retired    Fish farm manager: RETIRED  Social Needs  . Financial resource strain: Not on file  . Food insecurity:    Worry: Not on file    Inability: Not on file  . Transportation needs:    Medical: Not on file    Non-medical: Not on file  Tobacco Use  . Smoking status: Current Every Day Smoker    Packs/day: 0.50    Years: 15.00    Pack years: 7.50    Types: Cigarettes    Last attempt to quit: 05/27/2017    Years since quitting:  1.5  . Smokeless tobacco: Never Used  . Tobacco comment: 03/16/17  1 pk/week  Substance and Sexual Activity  . Alcohol use: No    Alcohol/week: 0.0 standard drinks  . Drug use: No  . Sexual activity: Not on file  Lifestyle  . Physical activity:    Days per week: Not on file    Minutes per session: Not on file  . Stress: Not on file  Relationships  . Social connections:    Talks on phone: Not on file    Gets together: Not on file    Attends religious  service: Not on file    Active member of club or organization: Not on file    Attends meetings of clubs or organizations: Not on file    Relationship status: Not on file  . Intimate partner violence:    Fear of current or ex partner: Not on file    Emotionally abused: Not on file    Physically abused: Not on file    Forced sexual activity: Not on file  Other Topics Concern  . Not on file  Social History Narrative   Single, dgtr lives with her   Never Smoked    Alcohol use- no   Drug use-no   Regular Exercise-yes   Former Smoker- 12/2008      Current Outpatient Medications:  .  acetaminophen (TYLENOL) 500 MG tablet, Take 1,000 mg by mouth every 6 (six) hours as needed for moderate pain or headache. , Disp: , Rfl:  .  aspirin 81 MG chewable tablet, Chew 81 mg by mouth daily., Disp: , Rfl:  .  clopidogrel (PLAVIX) 75 MG tablet, TAKE ONE TABLET BY MOUTH ONE TIME DAILY , Disp: 90 tablet, Rfl: 3 .  diclofenac sodium (VOLTAREN) 1 % GEL, Apply 2 g topically 3 (three) times daily as needed (for arm pain)., Disp: , Rfl:  .  famotidine (PEPCID) 20 MG tablet, TAKE 1 TABLET BY MOUTH TWICE DAILY AS NEEDED FOR HEARTBURN OR INDIGESTION, Disp: 180 tablet, Rfl: 0 .  fluticasone (FLONASE) 50 MCG/ACT nasal spray, Place 2 sprays into both nostrils at bedtime as needed for allergies., Disp: 32 g, Rfl: 2 .  gabapentin (NEURONTIN) 400 MG capsule, Take 1 capsule (400 mg total) by mouth 2 (two) times daily., Disp: 180 capsule, Rfl: 4 .  ipratropium  (ATROVENT) 0.06 % nasal spray, PLACE 2 SPRAYS INTO BOTH NOSTRILS 4 (FOUR) TIMES DAILY., Disp: 15 mL, Rfl: 2 .  losartan (COZAAR) 25 MG tablet, Take 1 tablet (25 mg total) by mouth daily., Disp: 90 tablet, Rfl: 2 .  metoprolol tartrate (LOPRESSOR) 25 MG tablet, Take 1 tablet (25 mg total) by mouth 2 (two) times daily., Disp: 180 tablet, Rfl: 3 .  Multiple Vitamin (MULTIVITAMIN) tablet, Take 1 tablet by mouth daily., Disp: , Rfl:  .  nicotine (NICODERM CQ - DOSED IN MG/24 HOURS) 14 mg/24hr patch, Place 1 patch (14 mg total) onto the skin daily., Disp: 28 patch, Rfl: 1 .  nitroGLYCERIN (NITROSTAT) 0.4 MG SL tablet, Place 1 tablet (0.4 mg total) under the tongue every 5 (five) minutes as needed for chest pain., Disp: 30 tablet, Rfl: 6 .  polyethylene glycol powder (GLYCOLAX/MIRALAX) powder, Take 17 g by mouth daily. (Patient taking differently: Take 17 g by mouth every other day. ), Disp: 500 g, Rfl: 3 .  predniSONE (DELTASONE) 20 MG tablet, 3 tabs for 3 days, 2 tabs for 3 days, 1 tabs for 3 days, and 1/2 tab for 3 days. Take tables together with breakfast., Disp: 20 tablet, Rfl: 0 .  rosuvastatin (CRESTOR) 40 MG tablet, Take 1 tablet (40 mg total) by mouth daily., Disp: 90 tablet, Rfl: 3  EXAM:  VITALS per patient if applicable:BP 262/03   Pulse 74   Resp 16   GENERAL: alert, oriented, appears well and in no acute distress  HEENT: atraumatic, normocephalic, conjunctiva clear, no facial obvious abnormalities on inspection.  LUNGS: on inspection no signs of respiratory distress, breathing rate appears normal, no obvious gross SOB, gasping or wheezing  CV: no obvious cyanosis  Rose: moves all visible  extremities without noticeable abnormality  PSYCH/NEURO: pleasant and cooperative, no obvious depression.She is anxious.  ASSESSMENT AND PLAN:  Discussed the following assessment and plan:  Right hip pain We discussed possible etiologies, she has history of RA and lower back pain. ?  Radicular  pain. ?  Trochanteric bursitis. After discussion of some side effects, she agrees with prednisone taper.  Recommend checking BP and BS regularly. If pain is persistent we need to consider Ortho/neurosurgeon evaluation.  Essential hypertension Continue monitoring BP regularly. No changes in current management. Continue low salt diet and avoid NSAID's.     I discussed the assessment and treatment plan with the patient. She was provided an opportunity to ask questions and all were answered. The patient agreed with the plan and demonstrated an understanding of the instructions.   The patient was advised to call back or seek an in-person evaluation if the symptoms worsen or if the condition fails to improve as anticipated.  Return if symptoms worsen or fail to improve, for Keep next appt.    [name], MD

## 2019-01-05 ENCOUNTER — Ambulatory Visit: Payer: Self-pay

## 2019-01-05 NOTE — Telephone Encounter (Signed)
Patient called and says she's taking Prednisone and is on the one a day pills and her blood sugar dropped this morning to 58. She says she was feeling bad and decided to check her blood sugar. She says she feels tired, nauseated and no energy. She says she ate breakfast, drank orange juice and ate some crackers and her blood sugar has not been higher than 67, which was checked 30 minutes before calling. I asked her to check it now and it is 56. She says she still feels a little nauseated and still feels worn out. She asks should she continue taking the prednisone. I advised that I cannot say to stop medication that it is up to her to decide. She says she will take another one tomorrow and see how she feels. I advised home care advice and if the blood sugar did not go up or if she's feeling worse to go to the UC or ED this weekend and I will send this over to the office for Dr. [name] to review. Advised someone will call with her recommendation, she verbalized understanding.  Reason for Disposition . [1] Morning (before breakfast) blood glucose < 80 mg/dL (4.4 mmol/L) AND [2] more than once in past week  Answer Assessment - Initial Assessment Questions 1. SYMPTOMS: "What symptoms are you concerned about?"     Low blood sugar, no energy, nauseated 2. ONSET:  "When did the symptoms start?"     Yesterday 3. BLOOD GLUCOSE: "What is your blood glucose level?"      67 30 minutes ago; it was 58 this morning 4. USUAL RANGE: "What is your blood glucose level usually?" (e.g., usual fasting morning value, usual evening value)     Maybe 80's-90's 5. TYPE 1 or 2:  "Do you know what type of diabetes you have?"  (e.g., Type 1, Type 2, Gestational; doesn't know)      Doesn't know 6. INSULIN: "Do you take insulin?" "What type of insulin(s) do you use? What is the mode of delivery? (syringe, pen (e.g., injection or  pump)      No 7. DIABETES PILLS: "Do you take any pills for your diabetes?"     No diabetic pills 8.  OTHER SYMPTOMS: "Do you have any symptoms?" (e.g., fever, frequent urination, difficulty breathing, vomiting)     Frequent urination 9. LOW BLOOD GLUCOSE TREATMENT: "What have you done so far to treat the low blood glucose level?"     OJ, ate breakfast, drank soda, crackers 10. FOOD: "When did you last eat or drink?"       Last ate about 30 minutes ago 11. ALONE: "Are you alone right now or is someone with you?"        Someone is with me 12. PREGNANCY: "Is there any chance you are pregnant?" "When was your last menstrual period?"       No  Protocols used: DIABETES - LOW BLOOD SUGAR-A-AH

## 2019-01-08 ENCOUNTER — Telehealth: Payer: Self-pay | Admitting: *Deleted

## 2019-01-08 NOTE — Telephone Encounter (Signed)
-----   Message from Lauralee Evener, [place] sent at 12/22/2018  3:04 PM EDT ----- Regarding: RE: precert Ok to schedule CPAP titration and COVID test. [name] # [id number] vALID DATES 12/22/18 to 06/20/19 ----- Message ----- From: Freada Bergeron, CMA Sent: 10/27/2018  12:03 PM EDT To: [facility] Sleep [facility] Subject: precert                                         recommend CPAP titration

## 2019-01-08 NOTE — Telephone Encounter (Signed)
Usually prednisone causes hyperglycemia. Prednisone can cause nausea and fatigue among other side effects. She can discontinue prednisone. Continue monitoring symptoms and BS.  Adequate hydration and small bites of food at the time throughout the day. If symptoms get worse, she needs to seek immediate medical attention. If symptoms are persistent and not resolve in 1-2 weeks after stopping Prednisone, she may need to be seen.  Thanks, BJ

## 2019-01-08 NOTE — Telephone Encounter (Signed)
Patient is scheduled for CPAP Titration on 01/31/19. Patient understands his titration study will be done at [facility] sleep lab. Patient understands he will receive a letter in a week or so detailing appointment, date, time, and location. Patient understands to call if he does not receive the letter  in a timely manner. Patient agrees with treatment and thanked me for call.

## 2019-01-08 NOTE — Telephone Encounter (Signed)
Message sent to Dr. Jordan for review. 

## 2019-01-09 ENCOUNTER — Encounter: Payer: Self-pay | Admitting: *Deleted

## 2019-01-09 NOTE — Telephone Encounter (Signed)
Patient given directions per Dr. [name].

## 2019-01-10 ENCOUNTER — Encounter (HOSPITAL_BASED_OUTPATIENT_CLINIC_OR_DEPARTMENT_OTHER): Payer: Medicare HMO

## 2019-01-29 ENCOUNTER — Other Ambulatory Visit (HOSPITAL_COMMUNITY)
Admission: RE | Admit: 2019-01-29 | Discharge: 2019-01-29 | Disposition: A | Discharge status: Home / Self Care | Payer: Medicare HMO | Source: Ambulatory Visit | Attending: Cardiology | Admitting: Cardiology

## 2019-01-29 NOTE — Progress Notes (Signed)
Spoke with Mrs Sieben to inquire of her arrival for Covid 19  screening prior to her procedure on 01/31/19. She stated she had to cancel do to family illness. I reached out to Kia at the sleep center with this information. Kia said she would reach out to Mrs Backstrom.

## 2019-01-31 ENCOUNTER — Encounter (HOSPITAL_BASED_OUTPATIENT_CLINIC_OR_DEPARTMENT_OTHER): Payer: Medicare HMO

## 2019-02-14 ENCOUNTER — Encounter: Payer: Medicare HMO | Admitting: Family Medicine

## 2019-02-27 ENCOUNTER — Other Ambulatory Visit: Payer: Medicare HMO

## 2019-02-27 ENCOUNTER — Other Ambulatory Visit: Payer: Self-pay

## 2019-02-27 DIAGNOSIS — E785 Hyperlipidemia, unspecified: Secondary | ICD-10-CM | POA: Diagnosis not present

## 2019-02-27 DIAGNOSIS — E1169 Type 2 diabetes mellitus with other specified complication: Secondary | ICD-10-CM | POA: Diagnosis not present

## 2019-02-27 LAB — LIPID PANEL
Chol/HDL Ratio: 2 ratio (ref 0.0–4.4)
Cholesterol, Total: 142 mg/dL (ref 100–199)
HDL: 70 mg/dL (ref >39)
LDL Calculated: 56 mg/dL (ref 0–99)
Triglycerides: 78 mg/dL (ref 0–149)
VLDL Cholesterol Cal: 16 mg/dL (ref 5–40)

## 2019-02-27 LAB — HEPATIC FUNCTION PANEL
ALT: 11 IU/L (ref 0–32)
AST: 21 IU/L (ref 0–40)
Albumin: 4.4 g/dL (ref 3.7–4.7)
Alkaline Phosphatase: 101 IU/L (ref 39–117)
Bilirubin Total: 0.4 mg/dL (ref 0.0–1.2)
Bilirubin, Direct: 0.13 mg/dL (ref 0.00–0.40)
Total Protein: 7.3 g/dL (ref 6.0–8.5)

## 2019-03-01 ENCOUNTER — Other Ambulatory Visit: Payer: Self-pay | Admitting: Family Medicine

## 2019-03-07 ENCOUNTER — Ambulatory Visit (INDEPENDENT_AMBULATORY_CARE_PROVIDER_SITE_OTHER): Payer: Medicare HMO

## 2019-03-07 ENCOUNTER — Ambulatory Visit (HOSPITAL_COMMUNITY)
Admission: EM | Admit: 2019-03-07 | Discharge: 2019-03-07 | Disposition: A | Discharge status: Home / Self Care | Payer: Medicare HMO | Attending: Emergency Medicine | Admitting: Emergency Medicine

## 2019-03-07 ENCOUNTER — Other Ambulatory Visit: Payer: Self-pay

## 2019-03-07 ENCOUNTER — Encounter (HOSPITAL_COMMUNITY): Payer: Self-pay

## 2019-03-07 DIAGNOSIS — R52 Pain, unspecified: Secondary | ICD-10-CM

## 2019-03-07 DIAGNOSIS — M25551 Pain in right hip: Secondary | ICD-10-CM

## 2019-03-07 DIAGNOSIS — S79911A Unspecified injury of right hip, initial encounter: Secondary | ICD-10-CM | POA: Diagnosis not present

## 2019-03-07 NOTE — Discharge Instructions (Addendum)
Your hip x-ray is normal.    Rest your hip joint.  You can use ice packs 20 minutes 3 times a day as needed.  Take Tylenol as needed for your discomfort.    Follow-up with the orthopedic listed below if your pain continues.    Go to the emergency department if you develop acute worsening pain, or numbness, tingling, weakness in your leg or foot.

## 2019-03-07 NOTE — ED Triage Notes (Signed)
Pt states she fell last Tuesday. Pt cc right hip and right knee. Pt states she PCP says she has inflammation before  the fall. PCP gave her a steroids.

## 2019-03-07 NOTE — ED Provider Notes (Signed)
[place]    CSN: [id number] Arrival date & time: 03/07/19  1313     History   Chief Complaint Chief Complaint  Patient presents with  . Fall    HPI [name] is a 73 y.o. female.   Patient presents with pain in her right hip since falling on 02/27/2019.  She denies head injury or LOC.  She states the pain has gotten progressively worse since her fall and is now radiating to her right knee.  She is now having to use her cane to ambulate which is not her usual.  She denies numbness or weakness in her RLE.   The history is provided by the patient.    Past Medical History:  Diagnosis Date  . Allergy   . Anxiety   . Arthritis    RA (Dr. Ouida Sills) Bilateral hands  . C. difficile diarrhea    history of  . CAD (coronary artery disease), native coronary artery    a. Cardiac CT with + FFR for Ramus b. cath 05/2017 100% sub acute lesion to mid circumflex artery s/p DES.  . Cancer ([place])    Right kidney CA removed.   . CKD (chronic kidney disease) stage 3, GFR 30-59 ml/min ([place]) 10/11/2016   s/p R nephrectomy  . Colon polyps 2008   HYPERPLASTIC  . Costochondritis    a. Nuc Stress Test 6/16: EF 70, no scar or ischemia, low risk // b. Echo 3/16: mild conc LVH, EF 65-70, no RWMA, Gr 1 DD, mild TR  . Diabetes mellitus without complication ([place])    type 2  no meds  . Dyspnea   . Gastritis   . GERD (gastroesophageal reflux disease)   . History of echocardiogram    Echo 3/18:  Moderate LVH, EF 11-94, grade 1 diastolic dysfunction, calcified aortic valve, mild MR, moderate LAE  . History of nuclear stress test    Myoview 3/18: Mod size and intensity fixed septal defect, may be artifact.Opposite mod size and intensity lat defect, which is reversible and could represent ischemia or possibly artifact (SDS 4). LVEF 71% with normal wall motion. Intermediate risk study. >> images reviewed with Dr. Dorris Carnes - no sig ischemia; med rx   . History of pneumonia   . Hx of  cardiovascular stress test    Lexiscan Myoview 6/16:  EF 70%, no scar or ischemia; Low Risk  . Hypertension   . Neuromuscular disorder ([place])    Neruopathy in bilateral feet  . Neuropathy   . Orthostatic hypotension 05/28/2017  . Osteoarthritis   . Pneumonia   . S/P angioplasty with stent 05/27/17 to LCX with DES  05/28/2017  . Thyroid disease     Patient Active Problem List   Diagnosis Date Noted  . Tobacco use disorder 09/29/2018  . Cervical myelopathy ([place]) 05/29/2018  . Cervicalgia 09/16/2017  . Orthostatic hypotension 05/28/2017  . S/P angioplasty with stent 05/27/17 to LCX with DES  05/28/2017  . CAD (coronary artery disease)   . Polyneuropathy 05/25/2017  . Chest pain with moderate risk for cardiac etiology 05/25/2017  . Class 1 obesity with serious comorbidity and body mass index (BMI) of 34.0 to 34.9 in adult 12/06/2016  . CKD (chronic kidney disease) stage 3, GFR 30-59 ml/min (HCC) 10/11/2016  . Hot flashes, menopausal 10/01/2016  . Chest pain with moderate risk of acute coronary syndrome 10/25/2014  . Degenerative spondylolisthesis 10/15/2014  . Spinal stenosis, lumbar region, with neurogenic claudication 02/12/2013  . DM (  diabetes mellitus), type 2 with renal complications ([place]) 81/85/6314  . History of Rtr nephrectomy 2010, secondary to renal cell cancer 01/14/2009  . PERSONAL HX COLONIC POLYPS 11/14/2008  . Hyperlipidemia associated with type 2 diabetes mellitus ([place]) 11/16/2007  . ESOPHAGEAL STRICTURE 10/12/2007  . Rheumatoid arthritis ([place]) 02/27/2007  . Essential hypertension 01/19/2007  . Allergic rhinitis 01/19/2007  . GERD 01/19/2007    Past Surgical History:  Procedure Laterality Date  . ABDOMINAL HYSTERECTOMY  1978  . ANTERIOR CERVICAL DECOMP/DISCECTOMY FUSION N/A 05/29/2018   Procedure: ANTERIOR CERVICAL DECOMPRESSION FUSION - CERVICAL FIVE-CERVICAL SIX - CERVICAL SIX-CERVICAL SEVEN;  Surgeon: Earnie Larsson, MD;  Location: [facility];  Service: Neurosurgery;   Laterality: N/A;  . BACK SURGERY     x 2  . CARPAL TUNNEL RELEASE Left   . COLONOSCOPY W/ BIOPSIES AND POLYPECTOMY     Hx: of  . CORONARY STENT INTERVENTION N/A 05/27/2017   Procedure: CORONARY STENT INTERVENTION;  Surgeon: Burnell Blanks, MD;  Location: [facility];  Service: Cardiovascular;  Laterality: N/A;  . ESOPHAGOGASTRODUODENOSCOPY    . HNP    . LEFT HEART CATH AND CORONARY ANGIOGRAPHY N/A 05/27/2017   Procedure: LEFT HEART CATH AND CORONARY ANGIOGRAPHY;  Surgeon: Burnell Blanks, MD;  Location: [facility];  Service: Cardiovascular;  Laterality: N/A;  . LUMBAR LAMINECTOMY/DECOMPRESSION MICRODISCECTOMY Left 02/12/2013   Procedure: LUMBAR TWO THREE, LUMBAR THREE FOUR, LUMBAR FOUR FIVE  LAMINECTOMY/DECOMPRESSION MICRODISCECTOMY 3 LEVELS;  Surgeon: Charlie Pitter, MD;  Location: [facility];  Service: Neurosurgery;  Laterality: Left;  . NEPHRECTOMY Right 2010   10.rcc cancer  . TOTAL KNEE ARTHROPLASTY Right    Redo    OB History   No obstetric history on file.      Home Medications    Prior to Admission medications   Medication Sig Start Date End Date Taking? Authorizing Provider  acetaminophen (TYLENOL) 500 MG tablet Take 1,000 mg by mouth every 6 (six) hours as needed for moderate pain or headache.     [provider]  aspirin 81 MG chewable tablet Chew 81 mg by mouth daily.    [provider]  clopidogrel (PLAVIX) 75 MG tablet TAKE ONE TABLET BY MOUTH ONE TIME DAILY  09/27/18   Richardson Dopp T, PA-C  diclofenac sodium (VOLTAREN) 1 % GEL Apply 2 g topically 3 (three) times daily as needed (for arm pain).    [provider]  famotidine (PEPCID) 20 MG tablet TAKE 1 TABLET BY MOUTH TWICE DAILY AS NEEDED FOR HEARTBURN OR INDIGESTION 03/06/19   [name], [name], MD  fluticasone [facility]) 50 MCG/ACT nasal spray Place 2 sprays into both nostrils at bedtime as needed for allergies. 10/06/18   [name], [name], MD  gabapentin  (NEURONTIN) 400 MG capsule Take 1 capsule (400 mg total) by mouth 2 (two) times daily. 11/21/18   Penumalli, Vikram R, MD  ipratropium (ATROVENT) 0.06 % nasal spray PLACE 2 SPRAYS INTO BOTH NOSTRILS 4 (FOUR) TIMES DAILY. 10/04/18   [name], [name], MD  losartan (COZAAR) 25 MG tablet Take 1 tablet (25 mg total) by mouth daily. 08/21/18   [name], [name], MD  metoprolol tartrate (LOPRESSOR) 25 MG tablet Take 1 tablet (25 mg total) by mouth 2 (two) times daily. 04/17/18   Fay Records, MD  Multiple Vitamin (MULTIVITAMIN) tablet Take 1 tablet by mouth daily.    [provider]  nicotine (NICODERM CQ - DOSED IN MG/24 HOURS) 14 mg/24hr patch Place  1 patch (14 mg total) onto the skin daily. 09/29/18   [name], [name], MD  nitroGLYCERIN (NITROSTAT) 0.4 MG SL tablet Place 1 tablet (0.4 mg total) under the tongue every 5 (five) minutes as needed for chest pain. 05/28/17   Isaiah Serge, NP  polyethylene glycol powder (GLYCOLAX/MIRALAX) powder Take 17 g by mouth daily. Patient taking differently: Take 17 g by mouth every other day.  05/03/17   Levin Erp, PA  rosuvastatin (CRESTOR) 40 MG tablet Take 1 tablet (40 mg total) by mouth daily. 09/14/18   Daune Perch, NP    Family History Family History  Problem Relation Age of Onset  . Rheum arthritis Mother   . Stroke Mother   . Prostate cancer Father   . Heart disease Father   . Diabetes Brother   . Dementia Brother   . Parkinsonism Brother   . Kidney disease Son   . Diabetes Brother   . Cancer Brother   . Dementia Brother   . Colon cancer Neg Hx   . Esophageal cancer Neg Hx   . Pancreatic cancer Neg Hx   . Liver disease Neg Hx     Social History Social History   Tobacco Use  . Smoking status: Current Every Day Smoker    Packs/day: 0.50    Years: 15.00    Pack years: 7.50    Types: Cigarettes    Last attempt to quit: 05/27/2017    Years since quitting: 1.7  . Smokeless tobacco: Never Used  . Tobacco comment: 03/16/17   1 pk/week  Substance Use Topics  . Alcohol use: No    Alcohol/week: 0.0 standard drinks  . Drug use: No     Allergies   Oxycodone-acetaminophen, Percocet [oxycodone-acetaminophen], Pravastatin sodium, Hydrocodone, and Aspirin   Review of Systems Review of Systems  Constitutional: Negative for chills and fever.  HENT: Negative for ear pain and sore throat.   Eyes: Negative for pain and visual disturbance.  Respiratory: Negative for cough and shortness of breath.   Cardiovascular: Negative for chest pain and palpitations.  Gastrointestinal: Negative for abdominal pain and vomiting.  Genitourinary: Negative for dysuria and hematuria.  Musculoskeletal: Positive for arthralgias. Negative for back pain.  Skin: Negative for color change and rash.  Neurological: Negative for seizures and syncope.  All other systems reviewed and are negative.    Physical Exam Triage Vital Signs ED Triage Vitals  Enc Vitals Group     BP 03/07/19 1332 126/84     Pulse Rate 03/07/19 1332 67     Resp 03/07/19 1332 18     Temp 03/07/19 1332 98.1 F (36.7 C)     Temp src --      SpO2 03/07/19 1332 97 %     Weight 03/07/19 1336 250 lb (113.4 kg)     Height --      Head Circumference --      Peak Flow --      Pain Score 03/07/19 1333 8     Pain Loc --      Pain Edu? --      Excl. in [place]? --    No data found.  Updated Vital Signs BP 126/84 (BP Location: Right Arm)   Pulse 67   Temp 98.1 F (36.7 C)   Resp 18   Wt 250 lb (113.4 kg)   SpO2 97%   BMI 40.35 kg/m   Visual Acuity Right Eye Distance:   Left Eye Distance:  Bilateral Distance:    Right Eye Near:   Left Eye Near:    Bilateral Near:     Physical Exam Vitals signs and nursing note reviewed.  Constitutional:      General: She is not in acute distress.    Appearance: She is well-developed.  HENT:     Head: Normocephalic and atraumatic.  Eyes:     Conjunctiva/sclera: Conjunctivae normal.  Neck:     Musculoskeletal: Neck  supple.  Cardiovascular:     Rate and Rhythm: Normal rate and regular rhythm.     Heart sounds: No murmur.  Pulmonary:     Effort: Pulmonary effort is normal. No respiratory distress.     Breath sounds: Normal breath sounds.  Abdominal:     Palpations: Abdomen is soft.     Tenderness: There is no abdominal tenderness.  Musculoskeletal:        General: No swelling or deformity.  Skin:    General: Skin is warm and dry.     Findings: No bruising, erythema or rash.  Neurological:     Mental Status: She is alert.     Sensory: No sensory deficit.     Motor: No weakness.     Gait: Gait abnormal.     Deep Tendon Reflexes: Reflexes normal.      UC Treatments / Results  Labs (all labs ordered are listed, but only abnormal results are displayed) Labs Reviewed - No data to display  EKG   Radiology No results found.  Procedures Procedures (including critical care time)  Medications Ordered in UC Medications - No data to display  Initial Impression / Assessment and Plan / UC Course  I have reviewed the triage vital signs and the nursing notes.  Pertinent labs & imaging results that were available during my care of the patient were reviewed by me and considered in my medical decision making (see chart for details).  Clinical Course as of Mar 06 1505  Wed Mar 07, 2019  1458 DG Hip Unilat With Pelvis 2-3 Views Right [KT]    Clinical Course User Index [KT] Sharion Balloon, NP    Right hip pain.  Hip x-ray negative.  Discussed with patient that she can take Tylenol as needed for her discomfort.  Discussed that she should rest her hip, use ice packs as directed.  Instructed patient to follow-up with orthopedics if her pain continues.  Instructed patient to go to the emergency department if she develops worsening pain, numbness, paresthesias, weakness, or other concerning symptoms.     Final Clinical Impressions(s) / UC Diagnoses   Final diagnoses:  Pain  Pain of right hip  joint     Discharge Instructions     Your hip x-ray is normal.    Rest your hip joint.  You can use ice packs 20 minutes 3 times a day as needed.  Take Tylenol as needed for your discomfort.    Follow-up with the orthopedic listed below if your pain continues.    Go to the emergency department if you develop acute worsening pain, or numbness, tingling, weakness in your leg or foot.            ED Prescriptions    None     Controlled Substance Prescriptions [place] Controlled Substance Registry consulted? Yes, I have consulted the [place] Controlled Substances Registry for this patient, and feel the risk/benefit ratio today is favorable for proceeding with this prescription for a controlled substance.   Sharion Balloon,  NP 03/07/19 1507

## 2019-03-12 NOTE — Progress Notes (Signed)
Cardiology Office Note:    Date:  03/13/2019   ID:  [name], DOB [birth year], MRN [id number]  PCP:  [name], [name], MD  Cardiologist:  Rose Carnes, MD  Electrophysiologist:  None   Referring MD: [name], [name], MD   Chief Complaint  Patient presents with  . Follow-up    CAD    History of Present Illness:    [name] is a 73 y.o. female with:  Coronary artery disease   S/p DES to mid LCx in 05/2017  Mild to mod non-obs Dz elsewhere (cath 2018)  Hypertension  Hyperlipidemia   Diabetes mellitus   Chronic kidney disease   Renal CA s/p R nephrectomy  Rheumatoid arthritis  Tobacco use  OSA  [name] was last seen in 09/2018 by Rose Perch, NP.  She returns for follow-up.  Overall, she has been doing well without chest discomfort, significant shortness of breath, orthopnea, lower extremity swelling or syncope.  She has not yet started on CPAP.  She had to delay the titration secondary to her son moving in with her.  He is on dialysis and recently had a leg amputation.  Prior CV studies:   The following studies were reviewed today:  Cardiac catheterization 05/27/17 LAD prox 20, mid 20, dist 30 RI 50 LCx mid 100 (CTO) RCA mid 20 PCI:  3 x 28 Synergy DES to Mid LCx      Echocardiogram 05/27/17 Mild conc LVH, EF 65-70, no RWMA, Gr 1 DD, trivial TR  Coronary CTA 05/26/17 IMPRESSION: 1. Coronary calcium score of 131. This was 63 percentile for age and sex matched control. 2. Normal coronary origin with right dominance. 3. A possible severe stenosis in the medium size ramus intermedius and complete occlusion of the LCX artery at the proximal to mid portion. Additional analysis with CT FFR will be performed. 4. Mildly dilated pulmonary artery measuring 34 mm suggestive of pulmonary hypertension 1. Left Main:  No significant stenosis. 2. LAD: No significant stenosis. 3. Ramus intermedius: Proximal FFR: 0.95, mid FFR 0.82, distal FFR 0.78. 4. LCX: No  significant stenosis in the proximal portion. Proximal to mid portion is occluded. 5. RCA: No significant stenosis. IMPRESSION: 1. CT FFR analysis showed significant stenosis in the distal ramus intermedius and occluded LCX after the proximal segment.   Echo 09/2016 Moderate LVH, EF 60-65, normal wall motion, grade 1 diastolic dysfunction, mild MR, moderate LAE  Myoview 09/2016 Fixed septal defect-possible artifact; reversible lateral defect (ischemia versus artifact), EF 71; intermediate risk Study reviewed with Dr. Harrington Blackwell - no significant ischemia identified; medical therapy continued  Nuclear Stress Perfusion Study 6/16 Ejection fraction is 70%. Wall motion is normal. Overall the study is normal. There is no definite scar or ischemia. This is a low risk scan. I have outlined above that there are mild changes on the tomographic images that are probably due to artifact related to the small ventricle.  Echo 3/16 Mild conc LVH, EF 65-70, no RWMA, Gr 1 DD, mild TR  Past Medical History:  Diagnosis Date  . Allergy   . Anxiety   . Arthritis    RA (Dr. Ouida Blackwell) Bilateral hands  . C. difficile diarrhea    history of  . CAD (coronary artery disease), native coronary artery    a. Cardiac CT with + FFR for Ramus b. cath 05/2017 100% sub acute lesion to mid circumflex artery s/p DES.  . Cancer ([place])    Right kidney CA removed.   [name] Kitchen  CKD (chronic kidney disease) stage 3, GFR 30-59 ml/min ([place]) 10/11/2016   s/p R nephrectomy  . Colon polyps 2008   HYPERPLASTIC  . Costochondritis    a. Nuc Stress Test 6/16: EF 70, no scar or ischemia, low risk // b. Echo 3/16: mild conc LVH, EF 65-70, no RWMA, Gr 1 DD, mild TR  . Diabetes mellitus without complication ([place])    type 2  no meds  . Dyspnea   . Gastritis   . GERD (gastroesophageal reflux disease)   . History of echocardiogram    Echo 3/18:  Moderate LVH, EF 41-96, grade 1 diastolic dysfunction, calcified aortic valve, mild MR, moderate LAE   . History of nuclear stress test    Myoview 3/18: Mod size and intensity fixed septal defect, may be artifact.Opposite mod size and intensity lat defect, which is reversible and could represent ischemia or possibly artifact (SDS 4). LVEF 71% with normal wall motion. Intermediate risk study. >> images reviewed with Dr. Dorris Blackwell - no sig ischemia; med rx   . History of pneumonia   . Hx of cardiovascular stress test    Lexiscan Myoview 6/16:  EF 70%, no scar or ischemia; Low Risk  . Hypertension   . Neuromuscular disorder ([place])    Neruopathy in bilateral feet  . Neuropathy   . Orthostatic hypotension 05/28/2017  . Osteoarthritis   . Pneumonia   . S/P angioplasty with stent 05/27/17 to LCX with DES  05/28/2017  . Thyroid disease    Surgical Hx: The patient  has a past surgical history that includes Abdominal hysterectomy (1978); Total knee arthroplasty (Right); HNP; Nephrectomy (Right, 2010); Back surgery; Colonoscopy w/ biopsies and polypectomy; Lumbar laminectomy/decompression microdiscectomy (Left, 02/12/2013); Carpal tunnel release (Left); LEFT HEART CATH AND CORONARY ANGIOGRAPHY (N/A, 05/27/2017); CORONARY STENT INTERVENTION (N/A, 05/27/2017); Esophagogastroduodenoscopy; and Anterior cervical decomp/discectomy fusion (N/A, 05/29/2018).   Current Medications: Current Meds  Medication Sig  . acetaminophen (TYLENOL) 500 MG tablet Take 1,000 mg by mouth every 6 (six) hours as needed for moderate pain or headache.   [name] Kitchen aspirin 81 MG chewable tablet Chew 81 mg by mouth daily.  . clopidogrel (PLAVIX) 75 MG tablet TAKE ONE TABLET BY MOUTH ONE TIME DAILY   . diclofenac sodium (VOLTAREN) 1 % GEL Apply 2 Blackwell topically 3 (three) times daily as needed (for arm pain).  . famotidine (PEPCID) 20 MG tablet TAKE 1 TABLET BY MOUTH TWICE DAILY AS NEEDED FOR HEARTBURN OR INDIGESTION  . fluticasone (FLONASE) 50 MCG/ACT nasal spray Place 2 sprays into both nostrils at bedtime as needed for allergies.  [name] Kitchen  gabapentin (NEURONTIN) 400 MG capsule Take 1 capsule (400 mg total) by mouth 2 (two) times daily.  [name] Kitchen ipratropium (ATROVENT) 0.06 % nasal spray PLACE 2 SPRAYS INTO BOTH NOSTRILS 4 (FOUR) TIMES DAILY.  [name] Kitchen losartan (COZAAR) 25 MG tablet Take 1 tablet (25 mg total) by mouth daily.  . metoprolol tartrate (LOPRESSOR) 25 MG tablet Take 1 tablet (25 mg total) by mouth 2 (two) times daily.  . Multiple Vitamin (MULTIVITAMIN) tablet Take 1 tablet by mouth daily.  . nicotine (NICODERM CQ - DOSED IN MG/24 HOURS) 14 mg/24hr patch Place 1 patch (14 mg total) onto the skin daily.  . nitroGLYCERIN (NITROSTAT) 0.4 MG SL tablet Place 1 tablet (0.4 mg total) under the tongue every 5 (five) minutes as needed for chest pain.  . polyethylene glycol powder (GLYCOLAX/MIRALAX) powder Take 17 Blackwell by mouth daily. (Patient taking differently: Take 17 Blackwell by mouth every  other day. )  . rosuvastatin (CRESTOR) 40 MG tablet Take 1 tablet (40 mg total) by mouth daily.     Allergies:   Oxycodone-acetaminophen, Percocet [oxycodone-acetaminophen], Pravastatin sodium, Hydrocodone, and Aspirin   Social History   Tobacco Use  . Smoking status: Current Every Day Smoker    Packs/day: 0.50    Years: 15.00    Pack years: 7.50    Types: Cigarettes    Last attempt to quit: 05/27/2017    Years since quitting: 1.7  . Smokeless tobacco: Never Used  . Tobacco comment: 03/16/17  1 pk/week  Substance Use Topics  . Alcohol use: No    Alcohol/week: 0.0 standard drinks  . Drug use: No     Family Hx: The patient's family history includes Cancer in her brother; Dementia in her brother and brother; Diabetes in her brother and brother; Heart disease in her father; Kidney disease in her son; Parkinsonism in her brother; Prostate cancer in her father; Rheum arthritis in her mother; Stroke in her mother. There is no history of Colon cancer, Esophageal cancer, Pancreatic cancer, or Liver disease.  ROS:   Please see the history of present illness.     ROS All other systems reviewed and are negative.   EKGs/Labs/Other Test Reviewed:    EKG:  EKG is  ordered today.  The ekg ordered today demonstrates normal sinus rhythm, heart rate 80, left axis deviation, QTC 468, no change from prior tracing  Recent Labs: 07/21/2018: Hemoglobin 13.6; Platelets 246 09/14/2018: BUN 16; Creatinine, Ser 1.32; NT-Pro BNP 170; Potassium 4.8; Sodium 143; TSH 2.200 02/27/2019: ALT 11   Recent Lipid Panel Lab Results  Component Value Date/Time   CHOL 142 02/27/2019 10:23 AM   TRIG 78 02/27/2019 10:23 AM   HDL 70 02/27/2019 10:23 AM   CHOLHDL 2.0 02/27/2019 10:23 AM   CHOLHDL 3 08/16/2018 09:45 AM   LDLCALC 56 02/27/2019 10:23 AM   LDLDIRECT 171.5 12/06/2011 08:12 AM     Physical Exam:    VS:  BP 124/82   Pulse 80   Ht 5\' 6"  (1.676 m)   Wt 218 lb (98.9 kg)   BMI 35.19 kg/m     Wt Readings from Last 3 Encounters:  03/13/19 218 lb (98.9 kg)  03/07/19 250 lb (113.4 kg)  11/10/18 200 lb (90.7 kg)     Physical Exam  Constitutional: She is oriented to person, place, and time. She appears well-developed and well-nourished. No distress.  HENT:  Head: Normocephalic and atraumatic.  Eyes: No scleral icterus.  Neck: No JVD present. No thyromegaly present.  Cardiovascular: Normal rate, regular rhythm and normal heart sounds.  No murmur heard. Pulmonary/Chest: Effort normal and breath sounds normal. She has no rales.  Abdominal: Soft. There is no hepatomegaly.  Musculoskeletal:        General: No edema.  Lymphadenopathy:    She has no cervical adenopathy.  Neurological: She is alert and oriented to person, place, and time.  Skin: Skin is warm and dry.  Psychiatric: She has a normal mood and affect.    ASSESSMENT & PLAN:    1. Coronary artery disease involving native coronary artery of native heart without angina pectoris Status post DES to the mid LCx in October 2018.  She is doing well without anginal symptoms.  Continue aspirin,  clopidogrel, metoprolol, rosuvastatin.  2. Essential hypertension The patient's blood pressure is controlled on her current regimen.  Continue current therapy.   3. Hyperlipidemia, unspecified hyperlipidemia type LDL optimal  on most recent lab work.  Continue current Rx.    4. CKD (chronic kidney disease) stage 3, GFR 30-59 ml/min (HCC) Recent creatinine stable.  5. OSA (obstructive sleep apnea) She is getting her CPAP titration rescheduled.  6. Type 2 diabetes mellitus with stage 3 chronic kidney disease, without long-term current use of insulin (HCC) Hemoglobin A1c in January less than 6.   Dispo:  Return in about 1 year (around 03/12/2020) for Routine Follow Up, w/ Dr. Burt Knack, or Richardson Dopp, PA-C.   Medication Adjustments/Labs and Tests Ordered: Current medicines are reviewed at length with the patient today.  Concerns regarding medicines are outlined above.  Tests Ordered: Orders Placed This Encounter  Procedures  . EKG 12-Lead   Medication Changes: No orders of the defined types were placed in this encounter.   Signed, Richardson Dopp, PA-C  03/13/2019 10:39 AM    [facility] [place], [place], [place]  [address] Phone: [phone]; Fax: [phone]

## 2019-03-13 ENCOUNTER — Other Ambulatory Visit: Payer: Self-pay

## 2019-03-13 ENCOUNTER — Encounter: Payer: Self-pay | Admitting: Physician Assistant

## 2019-03-13 ENCOUNTER — Ambulatory Visit: Payer: Medicare HMO | Admitting: Physician Assistant

## 2019-03-13 VITALS — BP 124/82 | HR 80 | Ht 66.0 in | Wt 218.0 lb

## 2019-03-13 DIAGNOSIS — N183 Chronic kidney disease, stage 3 unspecified: Secondary | ICD-10-CM

## 2019-03-13 DIAGNOSIS — E785 Hyperlipidemia, unspecified: Secondary | ICD-10-CM

## 2019-03-13 DIAGNOSIS — G4733 Obstructive sleep apnea (adult) (pediatric): Secondary | ICD-10-CM | POA: Diagnosis not present

## 2019-03-13 DIAGNOSIS — I10 Essential (primary) hypertension: Secondary | ICD-10-CM

## 2019-03-13 DIAGNOSIS — I2510 Atherosclerotic heart disease of native coronary artery without angina pectoris: Secondary | ICD-10-CM

## 2019-03-13 DIAGNOSIS — E1122 Type 2 diabetes mellitus with diabetic chronic kidney disease: Secondary | ICD-10-CM

## 2019-03-13 NOTE — Patient Instructions (Signed)
Medication Instructions:  none If you need a refill on your cardiac medications before your next appointment, please call your pharmacy.   Lab work: none If you have labs (blood work) drawn today and your tests are completely normal, you will receive your results only by: . MyChart Message (if you have MyChart) OR . A paper copy in the mail If you have any lab test that is abnormal or we need to change your treatment, we will call you to review the results.  Testing/Procedures: none  Follow-Up: At [facility], you and your health needs are our priority.  As part of our continuing mission to provide you with exceptional heart care, we have created designated Provider Care Teams.  These Care Teams include your primary Cardiologist (physician) and Advanced Practice Providers (APPs -  Physician Assistants and Nurse Practitioners) who all work together to provide you with the care you need, when you need it. You will need a follow up appointment in:  1 years.  Please call our office 2 months in advance to schedule this appointment.  You may see Paula Ross, MD or one of the following Advanced Practice Providers on your designated Care Team: Scott Weaver, PA-C Vin Bhagat, PA-C . Janine Hammond, NP  Any Other Special Instructions Will Be Listed Below (If Applicable).    

## 2019-03-23 DIAGNOSIS — H2513 Age-related nuclear cataract, bilateral: Secondary | ICD-10-CM | POA: Diagnosis not present

## 2019-03-23 DIAGNOSIS — E119 Type 2 diabetes mellitus without complications: Secondary | ICD-10-CM | POA: Diagnosis not present

## 2019-03-23 DIAGNOSIS — H524 Presbyopia: Secondary | ICD-10-CM | POA: Diagnosis not present

## 2019-03-23 LAB — HM DIABETES EYE EXAM

## 2019-03-26 ENCOUNTER — Ambulatory Visit (INDEPENDENT_AMBULATORY_CARE_PROVIDER_SITE_OTHER): Payer: Medicare HMO | Admitting: Family Medicine

## 2019-03-26 ENCOUNTER — Other Ambulatory Visit: Payer: Self-pay

## 2019-03-26 ENCOUNTER — Encounter: Payer: Self-pay | Admitting: Family Medicine

## 2019-03-26 VITALS — BP 120/88 | HR 68 | Temp 98.7°F | Resp 12 | Ht 66.0 in | Wt 220.4 lb

## 2019-03-26 DIAGNOSIS — F17200 Nicotine dependence, unspecified, uncomplicated: Secondary | ICD-10-CM

## 2019-03-26 DIAGNOSIS — I10 Essential (primary) hypertension: Secondary | ICD-10-CM

## 2019-03-26 DIAGNOSIS — E559 Vitamin D deficiency, unspecified: Secondary | ICD-10-CM | POA: Diagnosis not present

## 2019-03-26 DIAGNOSIS — Z780 Asymptomatic menopausal state: Secondary | ICD-10-CM

## 2019-03-26 DIAGNOSIS — N183 Chronic kidney disease, stage 3 unspecified: Secondary | ICD-10-CM

## 2019-03-26 DIAGNOSIS — R69 Illness, unspecified: Secondary | ICD-10-CM | POA: Diagnosis not present

## 2019-03-26 DIAGNOSIS — M059 Rheumatoid arthritis with rheumatoid factor, unspecified: Secondary | ICD-10-CM | POA: Diagnosis not present

## 2019-03-26 DIAGNOSIS — E1122 Type 2 diabetes mellitus with diabetic chronic kidney disease: Secondary | ICD-10-CM | POA: Diagnosis not present

## 2019-03-26 DIAGNOSIS — Z0000 Encounter for general adult medical examination without abnormal findings: Secondary | ICD-10-CM | POA: Diagnosis not present

## 2019-03-26 DIAGNOSIS — G629 Polyneuropathy, unspecified: Secondary | ICD-10-CM | POA: Diagnosis not present

## 2019-03-26 DIAGNOSIS — F419 Anxiety disorder, unspecified: Secondary | ICD-10-CM

## 2019-03-26 LAB — BASIC METABOLIC PANEL
BUN: 18 mg/dL (ref 6–23)
CO2: 26 mEq/L (ref 19–32)
Calcium: 9.4 mg/dL (ref 8.4–10.5)
Chloride: 105 mEq/L (ref 96–112)
Creatinine, Ser: 1.09 mg/dL (ref 0.40–1.20)
GFR: 59.18 mL/min — ABNORMAL LOW (ref >60.00)
Glucose, Bld: 85 mg/dL (ref 70–99)
Potassium: 4.4 mEq/L (ref 3.5–5.1)
Sodium: 140 mEq/L (ref 135–145)

## 2019-03-26 LAB — MICROALBUMIN / CREATININE URINE RATIO
Creatinine,U: 175.1 mg/dL
Microalb Creat Ratio: 27.8 mg/g (ref 0.0–30.0)
Microalb, Ur: 48.7 mg/dL — ABNORMAL HIGH (ref 0.0–1.9)

## 2019-03-26 LAB — HEMOGLOBIN A1C: Hgb A1c MFr Bld: 6.4 % (ref 4.6–6.5)

## 2019-03-26 MED ORDER — DULOXETINE HCL 30 MG PO CPEP: 30.0000 mg | ORAL_CAPSULE | Freq: Every day | ORAL | 1 refills | Status: DC

## 2019-03-26 NOTE — Assessment & Plan Note (Signed)
BP adequately controlled today. Recommend monitoring BP periodically. Continue low-salt diet. No changes in current management.

## 2019-03-26 NOTE — Assessment & Plan Note (Addendum)
Problem seems to be stable. Cymbalta 30 mg may help. Continue Gabapentin. Continue follow-up with neurologist.

## 2019-03-26 NOTE — Progress Notes (Signed)
HPI:   [name] is a 73 y.o. female, who is here today for f/u and AWV  Last CPE: 04/07/18 Last AWV, she is not sure if she ever had one.  Regular exercise 3 or more time per week: No Following a healthy diet: Trying to do so,she has not being consistent. She lives with her daughter and son.  Chronic medical problems: Neuropathy, chronic back pain, CKD, hypertension, hyperlipidemia, CAD, and RA among some.  Immunization History  Administered Date(s) Administered  . Influenza Split 05/23/2012, 06/07/2013  . Influenza Whole 08/02/2005, 05/08/2007, 05/09/2008, 08/19/2009, 06/03/2010  . Influenza, High Dose Seasonal PF 06/20/2014, 05/13/2015, 06/08/2016, 05/23/2017, 05/18/2018  . Pneumococcal Conjugate-13 02/27/2007  . Pneumococcal Polysaccharide-23 02/27/2007, 12/16/2011, 10/16/2014, 05/26/2017  . Td 08/02/2001  . Tdap 12/16/2011  . Zoster 12/27/2012   Mammogram: 06/2017 Bi-Rads 1 Colonoscopy: 04/2015. DEXA: She is due for one.  Hep C screening: 08/16/18.  Independent ADL's and IADL's,except for intermittent urine incontinence.  Functional Status Survey: Is the patient deaf or have difficulty hearing?: No Does the patient have difficulty seeing, even when wearing glasses/contacts?: No Does the patient have difficulty concentrating, remembering, or making decisions?: No Does the patient have difficulty walking or climbing stairs?: No Does the patient have difficulty dressing or bathing?: No Does the patient have difficulty doing errands alone such as visiting a doctor's office or shopping?: No  Providers She sees regularly:  Eye care provider: Dr Diona Fanti seen 03/23/19. Urologist: Dr Luberta Robertson , sees him for urine incontinence. Cardiologist: Dr Radford Pax Neurologist: Dr Barrie Folk seen in 11/2018. Neurosurgeon: Dr Georga Hacking seen in 07/2018.  Depression screen [facility] 2/9 03/26/2019  Decreased Interest 0  Down, Depressed, Hopeless 1  PHQ - 2 Score 1  Some recent  data might be hidden    Mini-Cog - 03/26/19 1128    Normal clock drawing test?  yes    How many words correct?  3        Hearing Screening   125Hz  250Hz  500Hz  1000Hz  2000Hz  3000Hz  4000Hz  6000Hz  8000Hz   Right ear:           Left ear:             Visual Acuity Screening   Right eye Left eye Both eyes  Without correction:     With correction: 20/30 20/30 20/30    DM II: She is on nonpharmacologic treatment  Lab Results  Component Value Date   HGBA1C 5.9 08/16/2018   Hypertension and CKD 3: Currently she is on metoprolol tartrate 25 mg twice daily and losartan 25 mg daily. She has not noted foam in urine or decreased urine output. CAD, she follows with cardiology regularly. Currently she is on Plavix 75 mg daily, Crestor 40 mg daily, and Aspirin 81 mg daily.  Lab Results  Component Value Date   CREATININE 1.32 (H) 09/14/2018   BUN 16 09/14/2018   NA 143 09/14/2018   K 4.8 09/14/2018   CL 103 09/14/2018   CO2 23 09/14/2018    Lab Results  Component Value Date   MICROALBUR 51.3 (H) 08/16/2018   Lab Results  Component Value Date   CHOL 142 02/27/2019   HDL 70 02/27/2019   LDLCALC 56 02/27/2019   LDLDIRECT 171.5 12/06/2011   TRIG 78 02/27/2019   CHOLHDL 2.0 02/27/2019   Hx of RA,she followed with rheuma in the past,Dr Thapa. She is requesting referral. C/O cervical and lower back pain. She also has generalized arthralgias ("all over) attributed to  RA,intermittent joint edema but no erythema.  Intermittent umbness on left upper and left ower extremities. + Burning and tingling. Also mentions "cold feet." She has not noted rash or skin ulcers. Gabapentin 400 mg bid has helped.  EMG in 2018: Diffuse widespread axonal polyneuropathy. She sees neurologist regularly.  Anxiety,goign through stress. Her son has ESRD on dialysis, has been in and out the hospital. Denies depression. In the past she tried Paxil but she discontinued treatment.  Vit D deficiency:  08/2018 25 OH vit D was low at 12.87. She is not on vit D supplementation.  Review of Systems  Constitutional: Positive for fatigue (Pending sleep study with CPAP). Negative for appetite change and fever.  HENT: Negative for dental problem, hearing loss, mouth sores, sore throat, trouble swallowing and voice change.   Eyes: Negative for redness and visual disturbance.  Respiratory: Negative for cough, shortness of breath and wheezing.   Cardiovascular: Negative for chest pain and leg swelling.  Gastrointestinal: Negative for abdominal pain, nausea and vomiting.       No changes in bowel habits.  Endocrine: Negative for cold intolerance, heat intolerance, polydipsia, polyphagia and polyuria.  Genitourinary: Negative for decreased urine volume, dysuria, hematuria, vaginal bleeding and vaginal discharge.  Musculoskeletal: Positive for arthralgias, back pain and neck pain. Negative for gait problem.  Skin: Negative for color change and rash.  Allergic/Immunologic: Positive for environmental allergies.  Neurological: Negative for syncope, weakness and headaches.  Hematological: Negative for adenopathy. Does not bruise/bleed easily.  Psychiatric/Behavioral: Positive for sleep disturbance. Negative for confusion. The patient is nervous/anxious.   All other systems reviewed and are negative.  Current Outpatient Medications on File Prior to Visit  Medication Sig Dispense Refill  . acetaminophen (TYLENOL) 500 MG tablet Take 1,000 mg by mouth every 6 (six) hours as needed for moderate pain or headache.     [name] Kitchen aspirin 81 MG chewable tablet Chew 81 mg by mouth daily.    . clopidogrel (PLAVIX) 75 MG tablet TAKE ONE TABLET BY MOUTH ONE TIME DAILY  90 tablet 3  . diclofenac sodium (VOLTAREN) 1 % GEL Apply 2 g topically 3 (three) times daily as needed (for arm pain).    . famotidine (PEPCID) 20 MG tablet TAKE 1 TABLET BY MOUTH TWICE DAILY AS NEEDED FOR HEARTBURN OR INDIGESTION 60 tablet 1  . fluticasone  (FLONASE) 50 MCG/ACT nasal spray Place 2 sprays into both nostrils at bedtime as needed for allergies. 32 g 2  . gabapentin (NEURONTIN) 400 MG capsule Take 1 capsule (400 mg total) by mouth 2 (two) times daily. 180 capsule 4  . ipratropium (ATROVENT) 0.06 % nasal spray PLACE 2 SPRAYS INTO BOTH NOSTRILS 4 (FOUR) TIMES DAILY. 15 mL 2  . losartan (COZAAR) 25 MG tablet Take 1 tablet (25 mg total) by mouth daily. 90 tablet 2  . metoprolol tartrate (LOPRESSOR) 25 MG tablet Take 1 tablet (25 mg total) by mouth 2 (two) times daily. 180 tablet 3  . Multiple Vitamin (MULTIVITAMIN) tablet Take 1 tablet by mouth daily.    . nicotine (NICODERM CQ - DOSED IN MG/24 HOURS) 14 mg/24hr patch Place 1 patch (14 mg total) onto the skin daily. 28 patch 1  . nitroGLYCERIN (NITROSTAT) 0.4 MG SL tablet Place 1 tablet (0.4 mg total) under the tongue every 5 (five) minutes as needed for chest pain. 30 tablet 6  . polyethylene glycol powder (GLYCOLAX/MIRALAX) powder Take 17 g by mouth daily. (Patient taking differently: Take 17 g by mouth every other  day. ) 500 g 3  . rosuvastatin (CRESTOR) 40 MG tablet Take 1 tablet (40 mg total) by mouth daily. 90 tablet 3   No current facility-administered medications on file prior to visit.      Past Medical History:  Diagnosis Date  . Allergy   . Anxiety   . Arthritis    RA (Dr. Ouida Sills) Bilateral hands  . C. difficile diarrhea    history of  . CAD (coronary artery disease), native coronary artery    a. Cardiac CT with + FFR for Ramus b. cath 05/2017 100% sub acute lesion to mid circumflex artery s/p DES.  . Cancer ([place])    Right kidney CA removed.   . CKD (chronic kidney disease) stage 3, GFR 30-59 ml/min ([place]) 10/11/2016   s/p R nephrectomy  . Colon polyps 2008   HYPERPLASTIC  . Costochondritis    a. Nuc Stress Test 6/16: EF 70, no scar or ischemia, low risk // b. Echo 3/16: mild conc LVH, EF 65-70, no RWMA, Gr 1 DD, mild TR  . Diabetes mellitus without complication  ([place])    type 2  no meds  . Dyspnea   . Gastritis   . GERD (gastroesophageal reflux disease)   . History of echocardiogram    Echo 3/18:  Moderate LVH, EF [id number], grade 1 diastolic dysfunction, calcified aortic valve, mild MR, moderate LAE  . History of nuclear stress test    Myoview 3/18: Mod size and intensity fixed septal defect, may be artifact.Opposite mod size and intensity lat defect, which is reversible and could represent ischemia or possibly artifact (SDS 4). LVEF 71% with normal wall motion. Intermediate risk study. >> images reviewed with Dr. Dorris Carnes - no sig ischemia; med rx   . History of pneumonia   . Hx of cardiovascular stress test    Lexiscan Myoview 6/16:  EF 70%, no scar or ischemia; Low Risk  . Hypertension   . Neuromuscular disorder ([place])    Neruopathy in bilateral feet  . Neuropathy   . Orthostatic hypotension 05/28/2017  . Osteoarthritis   . Pneumonia   . S/P angioplasty with stent 05/27/17 to LCX with DES  05/28/2017  . Thyroid disease     Past Surgical History:  Procedure Laterality Date  . ABDOMINAL HYSTERECTOMY  1978  . ANTERIOR CERVICAL DECOMP/DISCECTOMY FUSION N/A 05/29/2018   Procedure: ANTERIOR CERVICAL DECOMPRESSION FUSION - CERVICAL FIVE-CERVICAL SIX - CERVICAL SIX-CERVICAL SEVEN;  Surgeon: Earnie Larsson, MD;  Location: [place];  Service: Neurosurgery;  Laterality: N/A;  . BACK SURGERY     x 2  . CARPAL TUNNEL RELEASE Left   . COLONOSCOPY W/ BIOPSIES AND POLYPECTOMY     Hx: of  . CORONARY STENT INTERVENTION N/A 05/27/2017   Procedure: CORONARY STENT INTERVENTION;  Surgeon: Burnell Blanks, MD;  Location: [place] [facility];  Service: Cardiovascular;  Laterality: N/A;  . ESOPHAGOGASTRODUODENOSCOPY    . HNP    . LEFT HEART CATH AND CORONARY ANGIOGRAPHY N/A 05/27/2017   Procedure: LEFT HEART CATH AND CORONARY ANGIOGRAPHY;  Surgeon: Burnell Blanks, MD;  Location: [place] [facility];  Service: Cardiovascular;  Laterality: N/A;  .  LUMBAR LAMINECTOMY/DECOMPRESSION MICRODISCECTOMY Left 02/12/2013   Procedure: LUMBAR TWO THREE, LUMBAR THREE FOUR, LUMBAR FOUR FIVE  LAMINECTOMY/DECOMPRESSION MICRODISCECTOMY 3 LEVELS;  Surgeon: Charlie Pitter, MD;  Location: [place] [facility];  Service: Neurosurgery;  Laterality: Left;  . NEPHRECTOMY Right 2010   10.rcc cancer  . TOTAL KNEE ARTHROPLASTY Right  Redo    Allergies  Allergen Reactions  . Oxycodone-Acetaminophen Hives and Itching    hallucinations  . Percocet [Oxycodone-Acetaminophen] Hives, Itching and Other (See Comments)    hallucinations  . Pravastatin Sodium Other (See Comments)    Muscle aches Muscle aches  . Hydrocodone Other (See Comments)    "crazy dreams"  . Aspirin Other (See Comments)    GI upset Other reaction(s): Other (See Comments) REACTION: GI upset    Family History  Problem Relation Age of Onset  . Rheum arthritis Mother   . Stroke Mother   . Prostate cancer Father   . Heart disease Father   . Diabetes Brother   . Dementia Brother   . Parkinsonism Brother   . Kidney disease Son   . Diabetes Brother   . Cancer Brother   . Dementia Brother   . Colon cancer Neg Hx   . Esophageal cancer Neg Hx   . Pancreatic cancer Neg Hx   . Liver disease Neg Hx     Social History   Socioeconomic History  . Marital status: Divorced    Spouse name: Not on file  . Number of children: 3  . Years of education: 77  . Highest education level: Not on file  Occupational History  . Occupation: Retired    Fish farm manager: RETIRED  Social Needs  . Financial resource strain: Not on file  . Food insecurity    Worry: Not on file    Inability: Not on file  . Transportation needs    Medical: Not on file    Non-medical: Not on file  Tobacco Use  . Smoking status: Current Every Day Smoker    Packs/day: 0.50    Years: 15.00    Pack years: 7.50    Types: Cigarettes    Last attempt to quit: 05/27/2017    Years since quitting: 1.8  . Smokeless tobacco: Never Used  .  Tobacco comment: 03/16/17  1 pk/week  Substance and Sexual Activity  . Alcohol use: No    Alcohol/week: 0.0 standard drinks  . Drug use: No  . Sexual activity: Not on file  Lifestyle  . Physical activity    Days per week: Not on file    Minutes per session: Not on file  . Stress: Not on file  Relationships  . Social Herbalist on phone: Not on file    Gets together: Not on file    Attends religious service: Not on file    Active member of club or organization: Not on file    Attends meetings of clubs or organizations: Not on file    Relationship status: Not on file  Other Topics Concern  . Not on file  Social History Narrative   Single, dgtr lives with her   Never Smoked    Alcohol use- no   Drug use-no   Regular Exercise-yes   Former Smoker- 12/2008    Vitals:   03/26/19 0957  BP: 120/88  Pulse: 68  Resp: 12  Temp: 98.7 F (37.1 C)  SpO2: 98%   Body mass index is 35.57 kg/m.  Wt Readings from Last 3 Encounters:  03/26/19 220 lb 6 oz (100 kg)  03/13/19 218 lb (98.9 kg)  03/07/19 250 lb (113.4 kg)    Physical Exam  Nursing note and vitals reviewed. Constitutional: She is oriented to person, place, and time. She appears well-developed. No distress.  HENT:  Head: Normocephalic and atraumatic.  Right Ear: Hearing,  tympanic membrane, external ear and ear canal normal.  Left Ear: Hearing, tympanic membrane, external ear and ear canal normal.  Mouth/Throat: Uvula is midline, oropharynx is clear and moist and mucous membranes are normal.  Eyes: Pupils are equal, round, and reactive to light. Conjunctivae and EOM are normal.  Cardiovascular: Normal rate and regular rhythm.  No murmur heard. Pulses:      Dorsalis pedis pulses are 2+ on the right side and 2+ on the left side.  Respiratory: Effort normal and breath sounds normal. No respiratory distress.  GI: Soft. She exhibits no mass. There is no hepatomegaly. There is no abdominal tenderness.   Genitourinary:    Genitourinary Comments: Deferred to gyn.   Musculoskeletal:        General: Edema (Trace pitting LE edema,bilateral) present.  Lymphadenopathy:    She has no cervical adenopathy.  Neurological: She is alert and oriented to person, place, and time. She has normal strength. No cranial nerve deficit.  Stable gait,not assisted.  Skin: Skin is warm. No rash noted. No erythema.  Psychiatric: Her speech is normal. Her mood appears anxious.  Well groomed, good eye contact.    ASSESSMENT AND PLAN:  Ms. THERESSA HAECKER was here today annual physical examination.  Orders Placed This Encounter  Procedures  . DEXAScan  . VITAMIN D 25 Hydroxy (Vit-D Deficiency, Fractures)  . Fructosamine  . Hemoglobin A1c  . Microalbumin / creatinine urine ratio  . Basic metabolic panel  . Ambulatory referral to Rheumatology   Lab Results  Component Value Date   HGBA1C 6.4 03/26/2019   Lab Results  Component Value Date   MICROALBUR 48.7 (H) 03/26/2019   Lab Results  Component Value Date   CREATININE 1.09 03/26/2019   BUN 18 03/26/2019   NA 140 03/26/2019   K 4.4 03/26/2019   CL 105 03/26/2019   CO2 26 03/26/2019    Medicare annual wellness visit, subsequent We discussed the importance of staying active, physically and mentally, as well as the benefits of a healthy/balnace diet. Low impact exercise that involve stretching and strengthing are ideal. Vaccines up to date. We discussed preventive screening for the next 5-10 years, summery of recommendations given in AVS:   Fall prevention.  Advance directives and end of life discussed,she has POA and living will.   Vitamin D deficiency, unspecified Further recommendations will be given according to 25 OH vit D results.  Asymptomatic postmenopausal estrogen deficiency -     DEXAScan; Future  Anxiety disorder, unspecified type She agrees with starting Cymbalta. Some side effects dicussed. If she cannot tolerate 30 mg we  could try 20 mg. Medication may also help with OA,cervical pain,and neuropathy. Instructed about warning signs.  - DULoxetine (CYMBALTA) 30 MG capsule; Take 1 capsule (30 mg total) by mouth daily.  Dispense: 30 capsule; Refill: 1  Polyneuropathy Problem seems to be stable. Cymbalta 30 mg may help. Continue Gabapentin. Continue follow-up with neurologist.  Essential hypertension BP adequately controlled today. Recommend monitoring BP periodically. Continue low-salt diet. No changes in current management.  DM (diabetes mellitus), type 2 with renal complications ([place]) Problem has been well controlled with nonpharmacologic treatment. Regular exercise as tolerated and healthy diet with avoidance of added sugar food intake is an important part of treatment and recommended. Annual eye exam, periodic dental and foot care recommended. F/U in 5-6 months   CKD (chronic kidney disease) stage 3, GFR 30-59 ml/min ([facility]) Adequate BP and glucose control. Continue avoiding NSAIDs. Adequate hydration and low-salt  diet. Continue losartan 25 mg daily.   Rheumatoid arthritis ([place]) Currently she is not on treatment. Rheumatology referral placed as requested.   Tobacco use disorder We discussed adverse effects of tobacco as well as benefits of smoking cessation. She would like to try nicotine patches again, she has some at home.   Return in 6 weeks (on 05/07/2019) for cymbalta/anxiety and polireuropathy.   [name]. [name], MD  [facility]. [place] office.

## 2019-03-26 NOTE — Assessment & Plan Note (Signed)
Currently she is not on treatment. Rheumatology referral placed as requested.

## 2019-03-26 NOTE — Patient Instructions (Addendum)
  [name] , Thank you for taking time to come for your Medicare Wellness Visit. I appreciate your ongoing commitment to your health goals. Please review the following plan we discussed and let me know if I can assist you in the future.   These are the goals we discussed: Goals   None     This is a list of the screening recommended for you and due dates:  Health Maintenance  Topic Date Due  . DEXA scan (bone density measurement)  01/21/2009  . Hemoglobin A1C  02/14/2019  . Flu Shot  03/03/2019  . Eye exam for diabetics  03/17/2019  . Complete foot exam   08/17/2019  . Colon Cancer Screening  04/17/2020  . Tetanus Vaccine  12/15/2021  .  Hepatitis C: One time screening is recommended by Center for Disease Control  (CDC) for  adults born from [birth year] through 1967.   Completed  . Pneumonia vaccines  Completed   A few things to remember from today's visit:   Rheumatoid arthritis with positive rheumatoid factor, involving unspecified site ([place])  Type 2 diabetes mellitus with stage 3 chronic kidney disease, without long-term current use of insulin ([facility]) - Plan: Fructosamine, Hemoglobin A1c  Tobacco use disorder  Vitamin D deficiency, unspecified - Plan: VITAMIN D 25 Hydroxy (Vit-D Deficiency, Fractures)  Asymptomatic postmenopausal estrogen deficiency - Plan: DEXAScan  Routine general medical examination at a health care facility A few tips:  -As we age balance is not as good as it was, so there is a higher risks for falls. Please remove small rugs and furniture that is "in your way" and could increase the risk of falls. Stretching exercises may help with fall prevention: Yoga and Tai Chi are some examples. Low impact exercise is better, so you are not very achy the next day.  -Sun screen and avoidance of direct sun light recommended. Caution with dehydration, if working outdoors be sure to drink enough fluids.  - Some medications are not safe as we age, increases the risk of side  effects and can potentially interact with other medication you are also taken;  including some of over the counter medications. Be sure to let me know when you start a new medication even if it is a dietary/vitamin supplement.   -Healthy diet low in red meet/animal fat and sugar + regular physical activity is recommended.       No changes today. Keep your appointment for sleep study. I will treat her back in 6 months.   Please be sure medication list is accurate. If a new problem present, please set up appointment sooner than planned today.

## 2019-03-26 NOTE — Assessment & Plan Note (Signed)
We discussed adverse effects of tobacco as well as benefits of smoking cessation. She would like to try nicotine patches again, she has some at home.

## 2019-03-26 NOTE — Assessment & Plan Note (Addendum)
Adequate BP and glucose control. Continue avoiding NSAIDs. Adequate hydration and low-salt diet. Continue losartan 25 mg daily.

## 2019-03-26 NOTE — Assessment & Plan Note (Signed)
Problem has been well controlled with nonpharmacologic treatment. Regular exercise as tolerated and healthy diet with avoidance of added sugar food intake is an important part of treatment and recommended. Annual eye exam, periodic dental and foot care recommended. F/U in 5-6 months

## 2019-03-27 ENCOUNTER — Encounter: Payer: Self-pay | Admitting: Family Medicine

## 2019-03-27 LAB — VITAMIN D 25 HYDROXY (VIT D DEFICIENCY, FRACTURES): VITD: 15.71 ng/mL — ABNORMAL LOW (ref 30.00–100.00)

## 2019-03-28 LAB — FRUCTOSAMINE: Fructosamine: 257 umol/L (ref 205–285)

## 2019-03-29 ENCOUNTER — Encounter: Payer: Self-pay | Admitting: Family Medicine

## 2019-03-29 MED ORDER — VITAMIN D (ERGOCALCIFEROL) 1.25 MG (50000 UNIT) PO CAPS: ORAL_CAPSULE | ORAL | 3 refills | Status: DC

## 2019-04-03 ENCOUNTER — Other Ambulatory Visit: Payer: Self-pay

## 2019-04-03 ENCOUNTER — Ambulatory Visit: Payer: Medicare HMO | Admitting: Adult Health

## 2019-04-03 NOTE — Progress Notes (Deleted)
PATIENT: [name] DOB: 1946-07-22  REASON FOR VISIT: follow up HISTORY FROM: patient  HISTORY OF PRESENT ILLNESS: Today 04/03/19  HISTORY UPDATE (11/21/18, VRP): Since last visit, doing worse. Has had cervical spine surgery in Oct 2019. Symptoms are stable. Severity is moderate. No alleviating or aggravating factors. Tolerating gabapentin.    UPDATE (11/14/17, VRP): Since last visit, doing worse with pain in left arm and left leg. Tolerating gabapentin 400mg  . No alleviating or aggravating factors.   UPDATE (09/19/17, MM): She states overall she is doing well. She does have burning and tingling in the left arm that is worse at night. She also reports she occasionally has burning in the feet. She states the compounded cream andgabapentin has been beneficial. She did see hand surgery for carpal tunnel but he did not feel that it was severe enough to complete surgery per the patient. The patient also reports that she is been having neck pain. She did see her primary care who felt that it was muscle spasms and placed on tizanidine. She returns today for evaluation.  UPDATE 03/16/17: Since last visit, doing a little better with neuropathy cream and gabapentin. Sxs stable. Planning to get second opinion [facility] rheumatology clinic.   PRIOR HPI (12/01/16):73 year old female with history of rheumatoid arthritis, here for evaluation of left hand numbness and pain. Patient reports numbness in bilateral hands and feet, especially left hand for past 6 months. She describes pain and tenderness in her left fifth digit radiating up to her left elbow. Patient denies any recent accidents or traumas. Patient has history of diabetes, hypertension, renal cell carcinoma, rheumatoid arthritis, currently on leflunomide. Patient presented for EMG nerve conduction study on 10/14/16 which showed diffuse widespread axonal polyneuropathy as well as superimposed left carpal tunnel syndrome. Patient has history of  right carpal tunnel syndrome status post surgery with good results.    REVIEW OF SYSTEMS: Out of a complete 14 system review of symptoms, the patient complains only of the following symptoms, and all other reviewed systems are negative.  ALLERGIES: Allergies  Allergen Reactions   Oxycodone-Acetaminophen Hives and Itching    hallucinations   Percocet [Oxycodone-Acetaminophen] Hives, Itching and Other (See Comments)    hallucinations   Pravastatin Sodium Other (See Comments)    Muscle aches Muscle aches   Hydrocodone Other (See Comments)    "crazy dreams"   Aspirin Other (See Comments)    GI upset Other reaction(s): Other (See Comments) REACTION: GI upset    HOME MEDICATIONS: Outpatient Medications Prior to Visit  Medication Sig Dispense Refill   acetaminophen (TYLENOL) 500 MG tablet Take 1,000 mg by mouth every 6 (six) hours as needed for moderate pain or headache.      aspirin 81 MG chewable tablet Chew 81 mg by mouth daily.     clopidogrel (PLAVIX) 75 MG tablet TAKE ONE TABLET BY MOUTH ONE TIME DAILY  90 tablet 3   diclofenac sodium (VOLTAREN) 1 % GEL Apply 2 g topically 3 (three) times daily as needed (for arm pain).     DULoxetine (CYMBALTA) 30 MG capsule Take 1 capsule (30 mg total) by mouth daily. 30 capsule 1   famotidine (PEPCID) 20 MG tablet TAKE 1 TABLET BY MOUTH TWICE DAILY AS NEEDED FOR HEARTBURN OR INDIGESTION 60 tablet 1   fluticasone (FLONASE) 50 MCG/ACT nasal spray Place 2 sprays into both nostrils at bedtime as needed for allergies. 32 g 2   gabapentin (NEURONTIN) 400 MG capsule Take 1 capsule (400 mg  total) by mouth 2 (two) times daily. 180 capsule 4   ipratropium (ATROVENT) 0.06 % nasal spray PLACE 2 SPRAYS INTO BOTH NOSTRILS 4 (FOUR) TIMES DAILY. 15 mL 2   losartan (COZAAR) 25 MG tablet Take 1 tablet (25 mg total) by mouth daily. 90 tablet 2   metoprolol tartrate (LOPRESSOR) 25 MG tablet Take 1 tablet (25 mg total) by mouth 2 (two) times  daily. 180 tablet 3   Multiple Vitamin (MULTIVITAMIN) tablet Take 1 tablet by mouth daily.     nicotine (NICODERM CQ - DOSED IN MG/24 HOURS) 14 mg/24hr patch Place 1 patch (14 mg total) onto the skin daily. 28 patch 1   nitroGLYCERIN (NITROSTAT) 0.4 MG SL tablet Place 1 tablet (0.4 mg total) under the tongue every 5 (five) minutes as needed for chest pain. 30 tablet 6   polyethylene glycol powder (GLYCOLAX/MIRALAX) powder Take 17 g by mouth daily. (Patient taking differently: Take 17 g by mouth every other day. ) 500 g 3   rosuvastatin (CRESTOR) 40 MG tablet Take 1 tablet (40 mg total) by mouth daily. 90 tablet 3   Vitamin D, Ergocalciferol, (DRISDOL) 1.25 MG (50000 UT) CAPS capsule 1 cap weekly x 8 weeks then every 2 weeks. 12 capsule 3   No facility-administered medications prior to visit.     PAST MEDICAL HISTORY: Past Medical History:  Diagnosis Date   Allergy    Anxiety    Arthritis    RA (Dr. Ouida Sills) Bilateral hands   C. difficile diarrhea    history of   CAD (coronary artery disease), native coronary artery    a. Cardiac CT with + FFR for Ramus b. cath 05/2017 100% sub acute lesion to mid circumflex artery s/p DES.   Cancer [facility])    Right kidney CA removed.    CKD (chronic kidney disease) stage 3, GFR 30-59 ml/min ([place]) 10/11/2016   s/p R nephrectomy   Colon polyps 2008   HYPERPLASTIC   Costochondritis    a. Nuc Stress Test 6/16: EF 70, no scar or ischemia, low risk // b. Echo 3/16: mild conc LVH, EF 65-70, no RWMA, Gr 1 DD, mild TR   Diabetes mellitus without complication (HCC)    type 2  no meds   Dyspnea    Gastritis    GERD (gastroesophageal reflux disease)    History of echocardiogram    Echo 3/18:  Moderate LVH, EF [id number], grade 1 diastolic dysfunction, calcified aortic valve, mild MR, moderate LAE   History of nuclear stress test    Myoview 3/18: Mod size and intensity fixed septal defect, may be artifact.Opposite mod size and intensity lat  defect, which is reversible and could represent ischemia or possibly artifact (SDS 4). LVEF 71% with normal wall motion. Intermediate risk study. >> images reviewed with Dr. Dorris Carnes - no sig ischemia; med rx    History of pneumonia    Hx of cardiovascular stress test    Lexiscan Myoview 6/16:  EF 70%, no scar or ischemia; Low Risk   Hypertension    Neuromuscular disorder ([place])    Neruopathy in bilateral feet   Neuropathy    Orthostatic hypotension 05/28/2017   Osteoarthritis    Pneumonia    S/P angioplasty with stent 05/27/17 to LCX with DES  05/28/2017   Thyroid disease     PAST SURGICAL HISTORY: Past Surgical History:  Procedure Laterality Date   ABDOMINAL HYSTERECTOMY  1978   ANTERIOR CERVICAL DECOMP/DISCECTOMY FUSION N/A 05/29/2018   Procedure:  ANTERIOR CERVICAL DECOMPRESSION FUSION - CERVICAL FIVE-CERVICAL SIX - CERVICAL SIX-CERVICAL SEVEN;  Surgeon: Earnie Larsson, MD;  Location: [place];  Service: Neurosurgery;  Laterality: N/A;   BACK SURGERY     x 2   CARPAL TUNNEL RELEASE Left    COLONOSCOPY W/ BIOPSIES AND POLYPECTOMY     Hx: of   CORONARY STENT INTERVENTION N/A 05/27/2017   Procedure: CORONARY STENT INTERVENTION;  Surgeon: Burnell Blanks, MD;  Location: [place] CV [facility];  Service: Cardiovascular;  Laterality: N/A;   ESOPHAGOGASTRODUODENOSCOPY     HNP     LEFT HEART CATH AND CORONARY ANGIOGRAPHY N/A 05/27/2017   Procedure: LEFT HEART CATH AND CORONARY ANGIOGRAPHY;  Surgeon: Burnell Blanks, MD;  Location: [place] [facility];  Service: Cardiovascular;  Laterality: N/A;   LUMBAR LAMINECTOMY/DECOMPRESSION MICRODISCECTOMY Left 02/12/2013   Procedure: LUMBAR TWO THREE, LUMBAR THREE FOUR, LUMBAR FOUR FIVE  LAMINECTOMY/DECOMPRESSION MICRODISCECTOMY 3 LEVELS;  Surgeon: Charlie Pitter, MD;  Location: [place] [facility];  Service: Neurosurgery;  Laterality: Left;   NEPHRECTOMY Right 2010   10.rcc cancer   TOTAL KNEE ARTHROPLASTY Right    Redo     FAMILY HISTORY: Family History  Problem Relation Age of Onset   Rheum arthritis Mother    Stroke Mother    Prostate cancer Father    Heart disease Father    Diabetes Brother    Dementia Brother    Parkinsonism Brother    Kidney disease Son    Diabetes Brother    Cancer Brother    Dementia Brother    Colon cancer Neg Hx    Esophageal cancer Neg Hx    Pancreatic cancer Neg Hx    Liver disease Neg Hx     SOCIAL HISTORY: Social History   Socioeconomic History   Marital status: Divorced    Spouse name: Not on file   Number of children: 3   Years of education: 12   Highest education level: Not on file  Occupational History   Occupation: Retired    Fish farm manager: RETIRED  Scientist, product/process development strain: Not on file   Food insecurity    Worry: Not on file    Inability: Not on file   Transportation needs    Medical: Not on file    Non-medical: Not on file  Tobacco Use   Smoking status: Current Every Day Smoker    Packs/day: 0.50    Years: 15.00    Pack years: 7.50    Types: Cigarettes    Last attempt to quit: 05/27/2017    Years since quitting: 1.8   Smokeless tobacco: Never Used   Tobacco comment: 03/16/17  1 pk/week  Substance and Sexual Activity   Alcohol use: No    Alcohol/week: 0.0 standard drinks   Drug use: No   Sexual activity: Not on file  Lifestyle   Physical activity    Days per week: Not on file    Minutes per session: Not on file   Stress: Not on file  Relationships   Social connections    Talks on phone: Not on file    Gets together: Not on file    Attends religious service: Not on file    Active member of club or organization: Not on file    Attends meetings of clubs or organizations: Not on file    Relationship status: Not on file   Intimate partner violence    Fear of current or ex partner: Not on file  Emotionally abused: Not on file    Physically abused: Not on file    Forced sexual  activity: Not on file  Other Topics Concern   Not on file  Social History Narrative   Single, dgtr lives with her   Never Smoked    Alcohol use- no   Drug use-no   Regular Exercise-yes   Former Smoker- 12/2008      PHYSICAL EXAM  There were no vitals filed for this visit. There is no height or weight on file to calculate BMI.  Generalized: Well developed, in no acute distress   Neurological examination  Mentation: Alert oriented to time, place, history taking. Follows all commands speech and language fluent Cranial nerve II-XII: Pupils were equal round reactive to light. Extraocular movements were full, visual field were full on confrontational test. Facial sensation and strength were normal. Uvula tongue midline. Head turning and shoulder shrug  were normal and symmetric. Motor: The motor testing reveals 5 over 5 strength of all 4 extremities. Good symmetric motor tone is noted throughout.  Sensory: Sensory testing is intact to soft touch on all 4 extremities. No evidence of extinction is noted.  Coordination: Cerebellar testing reveals good finger-nose-finger and heel-to-shin bilaterally.  Gait and station: Gait is normal. Tandem gait is normal. Romberg is negative. No drift is seen.  Reflexes: Deep tendon reflexes are symmetric and normal bilaterally.   DIAGNOSTIC DATA (LABS, IMAGING, TESTING) - I reviewed patient records, labs, notes, testing and imaging myself where available.  Lab Results  Component Value Date   WBC 7.0 07/21/2018   HGB 13.6 07/21/2018   HCT 40.6 07/21/2018   MCV 87 07/21/2018   PLT 246 07/21/2018      Component Value Date/Time   NA 140 03/26/2019 1143   NA 143 09/14/2018 1438   K 4.4 03/26/2019 1143   CL 105 03/26/2019 1143   CO2 26 03/26/2019 1143   GLUCOSE 85 03/26/2019 1143   BUN 18 03/26/2019 1143   BUN 16 09/14/2018 1438   CREATININE 1.09 03/26/2019 1143   CREATININE 1.18 (H) 10/01/2016 1622   CALCIUM 9.4 03/26/2019 1143   PROT 7.3  02/27/2019 1023   ALBUMIN 4.4 02/27/2019 1023   AST 21 02/27/2019 1023   ALT 11 02/27/2019 1023   ALKPHOS 101 02/27/2019 1023   BILITOT 0.4 02/27/2019 1023   GFRNONAA 40 (L) 09/14/2018 1438   GFRAA 46 (L) 09/14/2018 1438   Lab Results  Component Value Date   CHOL 142 02/27/2019   HDL 70 02/27/2019   LDLCALC 56 02/27/2019   LDLDIRECT 171.5 12/06/2011   TRIG 78 02/27/2019   CHOLHDL 2.0 02/27/2019   Lab Results  Component Value Date   HGBA1C 6.4 03/26/2019   Lab Results  Component Value Date   [id number] 12/01/2016   Lab Results  Component Value Date   TSH 2.200 09/14/2018      ASSESSMENT AND PLAN 73 y.o. year old female  has a past medical history of Allergy, Anxiety, Arthritis, C. difficile diarrhea, CAD (coronary artery disease), native coronary artery, Cancer ([place]), CKD (chronic kidney disease) stage 3, GFR 30-59 ml/min ([place]) (10/11/2016), Colon polyps (2008), Costochondritis, Diabetes mellitus without complication ([place]), Dyspnea, Gastritis, GERD (gastroesophageal reflux disease), History of echocardiogram, History of nuclear stress test, History of pneumonia, cardiovascular stress test, Hypertension, Neuromuscular disorder ([place]), Neuropathy, Orthostatic hypotension (05/28/2017), Osteoarthritis, Pneumonia, S/P angioplasty with stent 05/27/17 to LCX with DES  (05/28/2017), and Thyroid disease. here with ***   I spent 15  minutes with the patient. 50% of this time was spent   Ward Givens, MSN, NP-C 04/03/2019, 9:06 AM [facility] [address] [place], [place] [address] [phone]

## 2019-04-04 ENCOUNTER — Encounter: Payer: Self-pay | Admitting: Adult Health

## 2019-04-10 ENCOUNTER — Ambulatory Visit (INDEPENDENT_AMBULATORY_CARE_PROVIDER_SITE_OTHER)
Admission: RE | Admit: 2019-04-10 | Discharge: 2019-04-10 | Disposition: A | Discharge status: Home / Self Care | Payer: Medicare HMO | Source: Ambulatory Visit | Attending: Family Medicine | Admitting: Family Medicine

## 2019-04-10 ENCOUNTER — Other Ambulatory Visit: Payer: Self-pay

## 2019-04-10 DIAGNOSIS — Z780 Asymptomatic menopausal state: Secondary | ICD-10-CM | POA: Diagnosis not present

## 2019-04-13 ENCOUNTER — Telehealth: Payer: Self-pay | Admitting: Family Medicine

## 2019-04-13 NOTE — Telephone Encounter (Signed)
The patient would like  the results from done density test

## 2019-04-13 NOTE — Telephone Encounter (Signed)
I have not received DEXA results (I do not see it). We will let her know when results are back. Thanks, BJ

## 2019-04-16 ENCOUNTER — Other Ambulatory Visit: Payer: Self-pay | Admitting: *Deleted

## 2019-04-16 DIAGNOSIS — Z20822 Contact with and (suspected) exposure to covid-19: Secondary | ICD-10-CM

## 2019-04-16 DIAGNOSIS — R6889 Other general symptoms and signs: Secondary | ICD-10-CM | POA: Diagnosis not present

## 2019-04-17 LAB — NOVEL CORONAVIRUS, NAA: SARS-CoV-2, NAA: NOT DETECTED

## 2019-04-20 NOTE — Telephone Encounter (Signed)
DEXA is normal. Thanks, [name]

## 2019-04-20 NOTE — Telephone Encounter (Signed)
After viewing chart, bone density was done on 04/10/2019.

## 2019-04-20 NOTE — Telephone Encounter (Signed)
Patient's daughter, Mackie Pai, who is on [place], given results and verbalized understanding.

## 2019-04-21 ENCOUNTER — Other Ambulatory Visit: Payer: Self-pay

## 2019-04-21 ENCOUNTER — Encounter (HOSPITAL_COMMUNITY): Payer: Self-pay | Admitting: Emergency Medicine

## 2019-04-21 ENCOUNTER — Ambulatory Visit (HOSPITAL_COMMUNITY)
Admission: EM | Admit: 2019-04-21 | Discharge: 2019-04-21 | Disposition: A | Discharge status: Home / Self Care | Payer: Medicare HMO | Attending: Emergency Medicine | Admitting: Emergency Medicine

## 2019-04-21 DIAGNOSIS — M069 Rheumatoid arthritis, unspecified: Secondary | ICD-10-CM | POA: Diagnosis not present

## 2019-04-21 DIAGNOSIS — N183 Chronic kidney disease, stage 3 (moderate): Secondary | ICD-10-CM | POA: Insufficient documentation

## 2019-04-21 DIAGNOSIS — J0140 Acute pansinusitis, unspecified: Secondary | ICD-10-CM | POA: Diagnosis not present

## 2019-04-21 DIAGNOSIS — Z888 Allergy status to other drugs, medicaments and biological substances status: Secondary | ICD-10-CM | POA: Insufficient documentation

## 2019-04-21 DIAGNOSIS — I2510 Atherosclerotic heart disease of native coronary artery without angina pectoris: Secondary | ICD-10-CM | POA: Diagnosis not present

## 2019-04-21 DIAGNOSIS — Z8261 Family history of arthritis: Secondary | ICD-10-CM | POA: Diagnosis not present

## 2019-04-21 DIAGNOSIS — Z885 Allergy status to narcotic agent status: Secondary | ICD-10-CM | POA: Diagnosis not present

## 2019-04-21 DIAGNOSIS — Z833 Family history of diabetes mellitus: Secondary | ICD-10-CM | POA: Diagnosis not present

## 2019-04-21 DIAGNOSIS — Z809 Family history of malignant neoplasm, unspecified: Secondary | ICD-10-CM | POA: Insufficient documentation

## 2019-04-21 DIAGNOSIS — Z955 Presence of coronary angioplasty implant and graft: Secondary | ICD-10-CM | POA: Insufficient documentation

## 2019-04-21 DIAGNOSIS — Z841 Family history of disorders of kidney and ureter: Secondary | ICD-10-CM | POA: Diagnosis not present

## 2019-04-21 DIAGNOSIS — Z8249 Family history of ischemic heart disease and other diseases of the circulatory system: Secondary | ICD-10-CM | POA: Insufficient documentation

## 2019-04-21 DIAGNOSIS — Z886 Allergy status to analgesic agent status: Secondary | ICD-10-CM | POA: Diagnosis not present

## 2019-04-21 DIAGNOSIS — F17210 Nicotine dependence, cigarettes, uncomplicated: Secondary | ICD-10-CM | POA: Diagnosis not present

## 2019-04-21 DIAGNOSIS — Z96651 Presence of right artificial knee joint: Secondary | ICD-10-CM | POA: Diagnosis not present

## 2019-04-21 DIAGNOSIS — Z8042 Family history of malignant neoplasm of prostate: Secondary | ICD-10-CM | POA: Diagnosis not present

## 2019-04-21 DIAGNOSIS — Z79899 Other long term (current) drug therapy: Secondary | ICD-10-CM | POA: Diagnosis not present

## 2019-04-21 DIAGNOSIS — Z905 Acquired absence of kidney: Secondary | ICD-10-CM | POA: Insufficient documentation

## 2019-04-21 DIAGNOSIS — E7800 Pure hypercholesterolemia, unspecified: Secondary | ICD-10-CM | POA: Insufficient documentation

## 2019-04-21 DIAGNOSIS — Z818 Family history of other mental and behavioral disorders: Secondary | ICD-10-CM | POA: Diagnosis not present

## 2019-04-21 DIAGNOSIS — Z20828 Contact with and (suspected) exposure to other viral communicable diseases: Secondary | ICD-10-CM | POA: Insufficient documentation

## 2019-04-21 DIAGNOSIS — I129 Hypertensive chronic kidney disease with stage 1 through stage 4 chronic kidney disease, or unspecified chronic kidney disease: Secondary | ICD-10-CM | POA: Insufficient documentation

## 2019-04-21 DIAGNOSIS — E1122 Type 2 diabetes mellitus with diabetic chronic kidney disease: Secondary | ICD-10-CM | POA: Insufficient documentation

## 2019-04-21 DIAGNOSIS — Z7902 Long term (current) use of antithrombotics/antiplatelets: Secondary | ICD-10-CM | POA: Insufficient documentation

## 2019-04-21 DIAGNOSIS — Z85528 Personal history of other malignant neoplasm of kidney: Secondary | ICD-10-CM | POA: Insufficient documentation

## 2019-04-21 DIAGNOSIS — I252 Old myocardial infarction: Secondary | ICD-10-CM | POA: Insufficient documentation

## 2019-04-21 DIAGNOSIS — I10 Essential (primary) hypertension: Secondary | ICD-10-CM | POA: Diagnosis not present

## 2019-04-21 DIAGNOSIS — Z823 Family history of stroke: Secondary | ICD-10-CM | POA: Insufficient documentation

## 2019-04-21 DIAGNOSIS — K219 Gastro-esophageal reflux disease without esophagitis: Secondary | ICD-10-CM | POA: Insufficient documentation

## 2019-04-21 DIAGNOSIS — Z7982 Long term (current) use of aspirin: Secondary | ICD-10-CM | POA: Diagnosis not present

## 2019-04-21 MED ORDER — DOXYCYCLINE HYCLATE 100 MG PO CAPS: 100.0000 mg | ORAL_CAPSULE | Freq: Two times a day (BID) | ORAL | 0 refills | Status: AC

## 2019-04-21 NOTE — Discharge Instructions (Signed)
Discontinue Claritin because it is not helping, start Mucinex instead.  Continue Flonase.  Start saline nasal irrigation with a Milta Deiters med rinse and distilled water as often as you want.  Tylenol as needed for pain.  Follow-up with PMD in several days if not getting any better, to the ER if you get worse.  Please keep a close eye on your blood pressure. It is important to keep your blood pressure under good control, as having a elevated blood pressure for prolonged periods of time significantly increases your risk of stroke, heart attacks, kidney damage, eye damage, and other problems. Return immediately to the ER if you start having chest pain, headache, problems seeing, problems talking, problems walking, if you feel like you're about to pass out, if you do pass out, if you have a seizure, or for any other concerns.  Go to www.goodrx.com to look up your medications. This will give you a list of where you can find your prescriptions at the most affordable prices. Or ask the pharmacist what the cash price is, or if they have any other discount programs available to help make your medication more affordable. This can be less expensive than what you would pay with insurance.

## 2019-04-21 NOTE — ED Triage Notes (Signed)
Pt here for HA and nasal congestion; pt sts had negative covid test last week; pt sts still not feeling well

## 2019-04-21 NOTE — ED Provider Notes (Signed)
HPI  SUBJECTIVE:  [name] is a 73 y.o. female who presents with malaise, intermittent nasal congestion, rhinorrhea, frontal headache, sinus pain and pressure, postnasal drip for the past week.  She states that her upper teeth hurt.  She reports a cough productive of clear mucus.  She states that her allergies have been bothering her recently.  She reports occasional mild wheezing and shortness of breath at one point, but this has resolved.  She states that she got better, but then her symptoms returned yesterday.  No fevers, facial swelling.  She states that her upper teeth hurt.  No sore throat.  She called her PMD earlier this week, and tested negative for COVID.  She was advised to start Claritin.  She is also using Flonase, Tylenol.  No alleviating factors.  Symptoms are worse with bending forward.  She denies exposure to COVID, body aches, abdominal pain, diarrhea, nausea, vomiting.  No antibiotics in the past month.  No antipyretic in the past 4 to 6 hours.  He has a past medical history of hypertension, states that she is compliant with her medications.  States that her blood pressure has been under good control in 118-130s/89-96 over the past week.  She denies taking any decongestants.  She has an extensive past medical history including diabetes, hypertension, hypercholesterolemia, chronic kidney disease stage III, coronary disease, MI status post stent, rheumatoid arthritis, thyroid disease, allergic rhinitis status post nephrectomy for renal cell carcinoma.  She continues to smoke.  No history of asthma, emphysema, COPD.  PMD: [name], [name], MD   Past Medical History:  Diagnosis Date  . Allergy   . Anxiety   . Arthritis    RA (Dr. Ouida Sills) Bilateral hands  . C. difficile diarrhea    history of  . CAD (coronary artery disease), native coronary artery    a. Cardiac CT with + FFR for Ramus b. cath 05/2017 100% sub acute lesion to mid circumflex artery s/p DES.  . Cancer ([place])    Right kidney CA removed.   . CKD (chronic kidney disease) stage 3, GFR 30-59 ml/min ([place]) 10/11/2016   s/p R nephrectomy  . Colon polyps 2008   HYPERPLASTIC  . Costochondritis    a. Nuc Stress Test 6/16: EF 70, no scar or ischemia, low risk // b. Echo 3/16: mild conc LVH, EF 65-70, no RWMA, Gr 1 DD, mild TR  . Diabetes mellitus without complication ([place])    type 2  no meds  . Dyspnea   . Gastritis   . GERD (gastroesophageal reflux disease)   . History of echocardiogram    Echo 3/18:  Moderate LVH, EF [id number], grade 1 diastolic dysfunction, calcified aortic valve, mild MR, moderate LAE  . History of nuclear stress test    Myoview 3/18: Mod size and intensity fixed septal defect, may be artifact.Opposite mod size and intensity lat defect, which is reversible and could represent ischemia or possibly artifact (SDS 4). LVEF 71% with normal wall motion. Intermediate risk study. >> images reviewed with Dr. Dorris Carnes - no sig ischemia; med rx   . History of pneumonia   . Hx of cardiovascular stress test    Lexiscan Myoview 6/16:  EF 70%, no scar or ischemia; Low Risk  . Hypertension   . Neuromuscular disorder ([place])    Neruopathy in bilateral feet  . Neuropathy   . Orthostatic hypotension 05/28/2017  . Osteoarthritis   . Pneumonia   . S/P angioplasty with stent 05/27/17 to  LCX with DES  05/28/2017  . Thyroid disease     Past Surgical History:  Procedure Laterality Date  . ABDOMINAL HYSTERECTOMY  1978  . ANTERIOR CERVICAL DECOMP/DISCECTOMY FUSION N/A 05/29/2018   Procedure: ANTERIOR CERVICAL DECOMPRESSION FUSION - CERVICAL FIVE-CERVICAL SIX - CERVICAL SIX-CERVICAL SEVEN;  Surgeon: Earnie Larsson, MD;  Location: [facility];  Service: Neurosurgery;  Laterality: N/A;  . BACK SURGERY     x 2  . CARPAL TUNNEL RELEASE Left   . COLONOSCOPY W/ BIOPSIES AND POLYPECTOMY     Hx: of  . CORONARY STENT INTERVENTION N/A 05/27/2017   Procedure: CORONARY STENT INTERVENTION;  Surgeon: Burnell Blanks,  MD;  Location: [facility];  Service: Cardiovascular;  Laterality: N/A;  . ESOPHAGOGASTRODUODENOSCOPY    . HNP    . LEFT HEART CATH AND CORONARY ANGIOGRAPHY N/A 05/27/2017   Procedure: LEFT HEART CATH AND CORONARY ANGIOGRAPHY;  Surgeon: Burnell Blanks, MD;  Location: [facility];  Service: Cardiovascular;  Laterality: N/A;  . LUMBAR LAMINECTOMY/DECOMPRESSION MICRODISCECTOMY Left 02/12/2013   Procedure: LUMBAR TWO THREE, LUMBAR THREE FOUR, LUMBAR FOUR FIVE  LAMINECTOMY/DECOMPRESSION MICRODISCECTOMY 3 LEVELS;  Surgeon: Charlie Pitter, MD;  Location: [facility];  Service: Neurosurgery;  Laterality: Left;  . NEPHRECTOMY Right 2010   10.rcc cancer  . TOTAL KNEE ARTHROPLASTY Right    Redo    Family History  Problem Relation Age of Onset  . Rheum arthritis Mother   . Stroke Mother   . Prostate cancer Father   . Heart disease Father   . Diabetes Brother   . Dementia Brother   . Parkinsonism Brother   . Kidney disease Son   . Diabetes Brother   . Cancer Brother   . Dementia Brother   . Colon cancer Neg Hx   . Esophageal cancer Neg Hx   . Pancreatic cancer Neg Hx   . Liver disease Neg Hx     Social History   Tobacco Use  . Smoking status: Current Every Day Smoker    Packs/day: 0.50    Years: 15.00    Pack years: 7.50    Types: Cigarettes    Last attempt to quit: 05/27/2017    Years since quitting: 1.9  . Smokeless tobacco: Never Used  . Tobacco comment: 03/16/17  1 pk/week  Substance Use Topics  . Alcohol use: No    Alcohol/week: 0.0 standard drinks  . Drug use: No    No current facility-administered medications for this encounter.   Current Outpatient Medications:  .  acetaminophen (TYLENOL) 500 MG tablet, Take 1,000 mg by mouth every 6 (six) hours as needed for moderate pain or headache. , Disp: , Rfl:  .  aspirin 81 MG chewable tablet, Chew 81 mg by mouth daily., Disp: , Rfl:  .  clopidogrel (PLAVIX) 75 MG tablet, TAKE ONE TABLET BY MOUTH ONE TIME  DAILY , Disp: 90 tablet, Rfl: 3 .  diclofenac sodium (VOLTAREN) 1 % GEL, Apply 2 g topically 3 (three) times daily as needed (for arm pain)., Disp: , Rfl:  .  doxycycline (VIBRAMYCIN) 100 MG capsule, Take 1 capsule (100 mg total) by mouth 2 (two) times daily for 7 days., Disp: 14 capsule, Rfl: 0 .  DULoxetine (CYMBALTA) 30 MG capsule, Take 1 capsule (30 mg total) by mouth daily., Disp: 30 capsule, Rfl: 1 .  famotidine (PEPCID) 20 MG tablet, TAKE 1 TABLET BY MOUTH TWICE DAILY AS NEEDED FOR HEARTBURN OR INDIGESTION, Disp: 60 tablet, Rfl: 1 .  fluticasone (FLONASE) 50 MCG/ACT nasal spray, Place 2 sprays into both nostrils at bedtime as needed for allergies., Disp: 32 g, Rfl: 2 .  gabapentin (NEURONTIN) 400 MG capsule, Take 1 capsule (400 mg total) by mouth 2 (two) times daily., Disp: 180 capsule, Rfl: 4 .  ipratropium (ATROVENT) 0.06 % nasal spray, PLACE 2 SPRAYS INTO BOTH NOSTRILS 4 (FOUR) TIMES DAILY., Disp: 15 mL, Rfl: 2 .  losartan (COZAAR) 25 MG tablet, Take 1 tablet (25 mg total) by mouth daily., Disp: 90 tablet, Rfl: 2 .  metoprolol tartrate (LOPRESSOR) 25 MG tablet, Take 1 tablet (25 mg total) by mouth 2 (two) times daily., Disp: 180 tablet, Rfl: 3 .  Multiple Vitamin (MULTIVITAMIN) tablet, Take 1 tablet by mouth daily., Disp: , Rfl:  .  nicotine (NICODERM CQ - DOSED IN MG/24 HOURS) 14 mg/24hr patch, Place 1 patch (14 mg total) onto the skin daily., Disp: 28 patch, Rfl: 1 .  nitroGLYCERIN (NITROSTAT) 0.4 MG SL tablet, Place 1 tablet (0.4 mg total) under the tongue every 5 (five) minutes as needed for chest pain., Disp: 30 tablet, Rfl: 6 .  polyethylene glycol powder (GLYCOLAX/MIRALAX) powder, Take 17 g by mouth daily. (Patient taking differently: Take 17 g by mouth every other day. ), Disp: 500 g, Rfl: 3 .  rosuvastatin (CRESTOR) 40 MG tablet, Take 1 tablet (40 mg total) by mouth daily., Disp: 90 tablet, Rfl: 3 .  Vitamin D, Ergocalciferol, (DRISDOL) 1.25 MG (50000 UT) CAPS capsule, 1 cap weekly  x 8 weeks then every 2 weeks., Disp: 12 capsule, Rfl: 3  Allergies  Allergen Reactions  . Oxycodone-Acetaminophen Hives and Itching    hallucinations  . Percocet [Oxycodone-Acetaminophen] Hives, Itching and Other (See Comments)    hallucinations  . Pravastatin Sodium Other (See Comments)    Muscle aches Muscle aches  . Hydrocodone Other (See Comments)    "crazy dreams"  . Aspirin Other (See Comments)    GI upset Other reaction(s): Other (See Comments) REACTION: GI upset     ROS  As noted in HPI.   Physical Exam  BP (!) 187/105 (BP Location: Right Arm)   Pulse 71   Temp 98.3 F (36.8 C) (Oral)   Resp 18   SpO2 97%   Constitutional: Well developed, well nourished, no acute distress Eyes:  EOMI, conjunctiva normal bilaterally HENT: Normocephalic, atraumatic,mucus membranes moist.  Positive nasal congestion.  Erythematous, not swollen turbinates.  Positive exquisite maxillary and frontal sinus tenderness.  Normal oropharynx with postnasal drip. Respiratory: Normal inspiratory effort, lungs clear bilaterally, good air movement. Cardiovascular: Normal rate, regular rhythm no murmurs rubs or gallops GI: nondistended skin: No rash, skin intact Musculoskeletal: no deformities Neurologic: Alert & oriented x 3, no focal neuro deficits Psychiatric: Speech and behavior appropriate   ED Course   Medications - No data to display  Orders Placed This Encounter  Procedures  . Novel Coronavirus, NAA (Hosp order, Send-out to Ref Lab; TAT 18-24 hrs    Standing Status:   Standing    Number of Occurrences:   1    Order Specific Question:   Is this test for diagnosis or screening    Answer:   Diagnosis of ill patient    Order Specific Question:   Symptomatic for COVID-19 as defined by CDC    Answer:   Yes    Order Specific Question:   Date of Symptom Onset    Answer:   04/13/2019    Order Specific Question:   Hospitalized for  COVID-19    Answer:   No    Order Specific Question:    Admitted to ICU for COVID-19    Answer:   No    Order Specific Question:   Previously tested for COVID-19    Answer:   Yes    Order Specific Question:   Resident in a congregate (group) care setting    Answer:   No    Order Specific Question:   Employed in healthcare setting    Answer:   No    Order Specific Question:   Pregnant    Answer:   No    No results found for this or any previous visit (from the past 24 hour(s)). No results found.  ED Clinical Impression  1. Acute non-recurrent pansinusitis   2. Essential hypertension      ED Assessment/Plan  Repeating COVID test as she has multiple medical comorbidities.  Presentation consistent with a sinusitis.  Will send home with doxycycline.  Discontinue Claritin because it is not helping, start Mucinex instead.  Continue Flonase.  Start saline nasal irrigation with a Milta Deiters med rinse and distilled water as often as she wants.  Tylenol as needed for pain.  Follow-up with PMD in several days if not getting any better, to the ER if she gets worse.  Blood pressure noted. States BP has been normal over the past week.  She has taken her blood pressure medications today.  Pt has no historical evidence of end organ damage. Pt denies any CNS type sx such as HA, visual changes, focal paresis, or new onset seizure activity. Pt denies any CV sx such as CP, dyspnea, palpitations, pedal edema, tearing pain radiating to back or abd. Pt denied any renal sx such as anuria or hematuria. Pt denies illicit drug use, most notably cocaine, or recent use of OTC medications such as nasal decongestants.  We will have patient keep an eye on this.  Gave her strict ER return precautions.  Discussed MDM, treatment plan, and plan for follow-up with patient. Discussed sn/sx that should prompt return to the ED. patient agrees with plan.   Meds ordered this encounter  Medications  . doxycycline (VIBRAMYCIN) 100 MG capsule    Sig: Take 1 capsule (100 mg total) by mouth  2 (two) times daily for 7 days.    Dispense:  14 capsule    Refill:  0    *This clinic note was created using [employer]. Therefore, there may be occasional mistakes despite careful proofreading.   ?   Melynda Ripple, MD 04/22/19 1000

## 2019-04-22 LAB — NOVEL CORONAVIRUS, NAA (HOSP ORDER, SEND-OUT TO REF LAB; TAT 18-24 HRS): SARS-CoV-2, NAA: NOT DETECTED

## 2019-04-23 ENCOUNTER — Encounter (HOSPITAL_COMMUNITY): Payer: Self-pay

## 2019-04-24 ENCOUNTER — Other Ambulatory Visit: Payer: Self-pay | Admitting: Internal Medicine

## 2019-04-25 ENCOUNTER — Ambulatory Visit: Payer: Medicare HMO | Admitting: Adult Health

## 2019-04-25 ENCOUNTER — Other Ambulatory Visit: Payer: Self-pay

## 2019-04-25 ENCOUNTER — Encounter: Payer: Self-pay | Admitting: Adult Health

## 2019-04-25 ENCOUNTER — Telehealth: Payer: Self-pay | Admitting: *Deleted

## 2019-04-25 VITALS — BP 152/87 | HR 70 | Temp 97.8°F | Ht 66.0 in | Wt 224.0 lb

## 2019-04-25 DIAGNOSIS — G6289 Other specified polyneuropathies: Secondary | ICD-10-CM

## 2019-04-25 MED ORDER — GABAPENTIN 100 MG PO CAPS: 100.0000 mg | ORAL_CAPSULE | Freq: Two times a day (BID) | ORAL | 5 refills | Status: DC

## 2019-04-25 NOTE — Progress Notes (Signed)
PATIENT: [name] DOB: 27-Oct-1945  REASON FOR VISIT: follow up HISTORY FROM: patient  HISTORY OF PRESENT ILLNESS: Today 04/25/19:  [name] is a 73 year old female with a history of neuropathy.  She returns today for follow-up.  She reports that she has numbness in the hands.  She states they often feel swollen.  She denies any significant discomfort.  She reports that occasionally she will feel that something is sticking in her feet.  This is mainly worse at bedtime.  She continues on gabapentin 400 mg twice a day.  She denies any changes with her gait or balance.  She was on the compounded cream but last year the cost increased so she did not have it refilled.  She returns today for an evaluation.  HISTORY UPDATE (11/21/18, VRP): Since last visit, doing worse. Has had cervical spine surgery in Oct 2019. Symptoms are stable. Severity is moderate. No alleviating or aggravating factors. Tolerating gabapentin.    UPDATE (11/14/17, VRP): Since last visit, doing worse with pain in left arm and left leg. Tolerating gabapentin 400mg  . No alleviating or aggravating factors.   UPDATE (09/19/17, MM): She states overall she is doing well. She does have burning and tingling in the left arm that is worse at night. She also reports she occasionally has burning in the feet. She states the compounded cream andgabapentin has been beneficial. She did see hand surgery for carpal tunnel but he did not feel that it was severe enough to complete surgery per the patient. The patient also reports that she is been having neck pain. She did see her primary care who felt that it was muscle spasms and placed on tizanidine. She returns today for evaluation.  UPDATE 03/16/17: Since last visit, doing a little better with neuropathy cream and gabapentin. Sxs stable. Planning to get second opinion [facility] rheumatology clinic.   PRIOR HPI (12/01/16):73 year old female with history of rheumatoid arthritis, here for  evaluation of left hand numbness and pain. Patient reports numbness in bilateral hands and feet, especially left hand for past 6 months. She describes pain and tenderness in her left fifth digit radiating up to her left elbow. Patient denies any recent accidents or traumas. Patient has history of diabetes, hypertension, renal cell carcinoma, rheumatoid arthritis, currently on leflunomide. Patient presented for EMG nerve conduction study on 10/14/16 which showed diffuse widespread axonal polyneuropathy as well as superimposed left carpal tunnel syndrome. Patient has history of right carpal tunnel syndrome status post surgery with good results.  REVIEW OF SYSTEMS: Out of a complete 14 system review of symptoms, the patient complains only of the following symptoms, and all other reviewed systems are negative.  See HPI  ALLERGIES: Allergies  Allergen Reactions  . Oxycodone-Acetaminophen Hives and Itching    hallucinations  . Percocet [Oxycodone-Acetaminophen] Hives, Itching and Other (See Comments)    hallucinations  . Pravastatin Sodium Other (See Comments)    Muscle aches Muscle aches  . Hydrocodone Other (See Comments)    "crazy dreams"  . Aspirin Other (See Comments)    GI upset Other reaction(s): Other (See Comments) REACTION: GI upset    HOME MEDICATIONS: Outpatient Medications Prior to Visit  Medication Sig Dispense Refill  . acetaminophen (TYLENOL) 500 MG tablet Take 1,000 mg by mouth every 6 (six) hours as needed for moderate pain or headache.     [name] Kitchen aspirin 81 MG chewable tablet Chew 81 mg by mouth daily.    . clopidogrel (PLAVIX) 75 MG tablet TAKE  ONE TABLET BY MOUTH ONE TIME DAILY  90 tablet 3  . diclofenac sodium (VOLTAREN) 1 % GEL Apply 2 g topically 3 (three) times daily as needed (for arm pain).    [name] Kitchen doxycycline (VIBRAMYCIN) 100 MG capsule Take 1 capsule (100 mg total) by mouth 2 (two) times daily for 7 days. 14 capsule 0  . DULoxetine (CYMBALTA) 30 MG capsule Take 1 capsule  (30 mg total) by mouth daily. 30 capsule 1  . famotidine (PEPCID) 20 MG tablet TAKE 1 TABLET BY MOUTH TWICE DAILY AS NEEDED FOR HEARTBURN OR INDIGESTION 60 tablet 1  . fluticasone (FLONASE) 50 MCG/ACT nasal spray Place 2 sprays into both nostrils at bedtime as needed for allergies. 32 g 2  . gabapentin (NEURONTIN) 400 MG capsule Take 1 capsule (400 mg total) by mouth 2 (two) times daily. 180 capsule 4  . ipratropium (ATROVENT) 0.06 % nasal spray PLACE 2 SPRAYS INTO BOTH NOSTRILS 4 (FOUR) TIMES DAILY. 15 mL 2  . losartan (COZAAR) 25 MG tablet Take 1 tablet (25 mg total) by mouth daily. 90 tablet 2  . metoprolol tartrate (LOPRESSOR) 25 MG tablet TAKE ONE TABLET BY MOUTH TWICE DAILY  180 tablet 3  . Multiple Vitamin (MULTIVITAMIN) tablet Take 1 tablet by mouth daily.    . nicotine (NICODERM CQ - DOSED IN MG/24 HOURS) 14 mg/24hr patch Place 1 patch (14 mg total) onto the skin daily. 28 patch 1  . nitroGLYCERIN (NITROSTAT) 0.4 MG SL tablet Place 1 tablet (0.4 mg total) under the tongue every 5 (five) minutes as needed for chest pain. 30 tablet 6  . polyethylene glycol powder (GLYCOLAX/MIRALAX) powder Take 17 g by mouth daily. (Patient taking differently: Take 17 g by mouth every other day. ) 500 g 3  . rosuvastatin (CRESTOR) 40 MG tablet Take 1 tablet (40 mg total) by mouth daily. 90 tablet 3  . Vitamin D, Ergocalciferol, (DRISDOL) 1.25 MG (50000 UT) CAPS capsule 1 cap weekly x 8 weeks then every 2 weeks. 12 capsule 3   No facility-administered medications prior to visit.     PAST MEDICAL HISTORY: Past Medical History:  Diagnosis Date  . Allergy   . Anxiety   . Arthritis    RA (Dr. Ouida Sills) Bilateral hands  . C. difficile diarrhea    history of  . CAD (coronary artery disease), native coronary artery    a. Cardiac CT with + FFR for Ramus b. cath 05/2017 100% sub acute lesion to mid circumflex artery s/p DES.  . Cancer ([place])    Right kidney CA removed.   . CKD (chronic kidney disease) stage  3, GFR 30-59 ml/min ([place]) 10/11/2016   s/p R nephrectomy  . Colon polyps 2008   HYPERPLASTIC  . Costochondritis    a. Nuc Stress Test 6/16: EF 70, no scar or ischemia, low risk // b. Echo 3/16: mild conc LVH, EF 65-70, no RWMA, Gr 1 DD, mild TR  . Diabetes mellitus without complication ([place])    type 2  no meds  . Dyspnea   . Gastritis   . GERD (gastroesophageal reflux disease)   . History of echocardiogram    Echo 3/18:  Moderate LVH, EF [id number], grade 1 diastolic dysfunction, calcified aortic valve, mild MR, moderate LAE  . History of nuclear stress test    Myoview 3/18: Mod size and intensity fixed septal defect, may be artifact.Opposite mod size and intensity lat defect, which is reversible and could represent ischemia or possibly artifact (SDS 4).  LVEF 71% with normal wall motion. Intermediate risk study. >> images reviewed with Dr. Dorris Carnes - no sig ischemia; med rx   . History of pneumonia   . Hx of cardiovascular stress test    Lexiscan Myoview 6/16:  EF 70%, no scar or ischemia; Low Risk  . Hypertension   . Neuromuscular disorder ([place])    Neruopathy in bilateral feet  . Neuropathy   . Orthostatic hypotension 05/28/2017  . Osteoarthritis   . Pneumonia   . S/P angioplasty with stent 05/27/17 to LCX with DES  05/28/2017  . Thyroid disease     PAST SURGICAL HISTORY: Past Surgical History:  Procedure Laterality Date  . ABDOMINAL HYSTERECTOMY  1978  . ANTERIOR CERVICAL DECOMP/DISCECTOMY FUSION N/A 05/29/2018   Procedure: ANTERIOR CERVICAL DECOMPRESSION FUSION - CERVICAL FIVE-CERVICAL SIX - CERVICAL SIX-CERVICAL SEVEN;  Surgeon: Earnie Larsson, MD;  Location: [facility];  Service: Neurosurgery;  Laterality: N/A;  . BACK SURGERY     x 2  . CARPAL TUNNEL RELEASE Left   . COLONOSCOPY W/ BIOPSIES AND POLYPECTOMY     Hx: of  . CORONARY STENT INTERVENTION N/A 05/27/2017   Procedure: CORONARY STENT INTERVENTION;  Surgeon: Burnell Blanks, MD;  Location: [facility];   Service: Cardiovascular;  Laterality: N/A;  . ESOPHAGOGASTRODUODENOSCOPY    . HNP    . LEFT HEART CATH AND CORONARY ANGIOGRAPHY N/A 05/27/2017   Procedure: LEFT HEART CATH AND CORONARY ANGIOGRAPHY;  Surgeon: Burnell Blanks, MD;  Location: [facility];  Service: Cardiovascular;  Laterality: N/A;  . LUMBAR LAMINECTOMY/DECOMPRESSION MICRODISCECTOMY Left 02/12/2013   Procedure: LUMBAR TWO THREE, LUMBAR THREE FOUR, LUMBAR FOUR FIVE  LAMINECTOMY/DECOMPRESSION MICRODISCECTOMY 3 LEVELS;  Surgeon: Charlie Pitter, MD;  Location: [facility];  Service: Neurosurgery;  Laterality: Left;  . NEPHRECTOMY Right 2010   10.rcc cancer  . TOTAL KNEE ARTHROPLASTY Right    Redo    FAMILY HISTORY: Family History  Problem Relation Age of Onset  . Rheum arthritis Mother   . Stroke Mother   . Prostate cancer Father   . Heart disease Father   . Diabetes Brother   . Dementia Brother   . Parkinsonism Brother   . Kidney disease Son   . Diabetes Brother   . Cancer Brother   . Dementia Brother   . Colon cancer Neg Hx   . Esophageal cancer Neg Hx   . Pancreatic cancer Neg Hx   . Liver disease Neg Hx     SOCIAL HISTORY: Social History   Socioeconomic History  . Marital status: Divorced    Spouse name: Not on file  . Number of children: 3  . Years of education: 42  . Highest education level: Not on file  Occupational History  . Occupation: Retired    Fish farm manager: RETIRED  Social Needs  . Financial resource strain: Not on file  . Food insecurity    Worry: Not on file    Inability: Not on file  . Transportation needs    Medical: Not on file    Non-medical: Not on file  Tobacco Use  . Smoking status: Current Every Day Smoker    Packs/day: 0.50    Years: 15.00    Pack years: 7.50    Types: Cigarettes    Last attempt to quit: 05/27/2017    Years since quitting: 1.9  . Smokeless tobacco: Never Used  . Tobacco comment: 03/16/17  1 pk/week  Substance and Sexual Activity  . Alcohol  use:  No    Alcohol/week: 0.0 standard drinks  . Drug use: No  . Sexual activity: Not on file  Lifestyle  . Physical activity    Days per week: Not on file    Minutes per session: Not on file  . Stress: Not on file  Relationships  . Social Herbalist on phone: Not on file    Gets together: Not on file    Attends religious service: Not on file    Active member of club or organization: Not on file    Attends meetings of clubs or organizations: Not on file    Relationship status: Not on file  . Intimate partner violence    Fear of current or ex partner: Not on file    Emotionally abused: Not on file    Physically abused: Not on file    Forced sexual activity: Not on file  Other Topics Concern  . Not on file  Social History Narrative   Single, dgtr lives with her   Never Smoked    Alcohol use- no   Drug use-no   Regular Exercise-yes   Former Smoker- 12/2008      PHYSICAL EXAM  Vitals:   04/25/19 1353  BP: (!) 152/87  Pulse: 70  Temp: 97.8 F (36.6 C)  TempSrc: Oral  Weight: 224 lb (101.6 kg)  Height: 5\' 6"  (1.676 m)   Body mass index is 36.15 kg/m.  Generalized: Well developed, in no acute distress   Neurological examination  Mentation: Alert oriented to time, place, history taking. Follows all commands speech and language fluent Cranial nerve II-XII: Pupils were equal round reactive to light. Extraocular movements were full, visual field were full on confrontational test. . Head turning and shoulder shrug  were normal and symmetric. Motor: The motor testing reveals 5 over 5 strength of all 4 extremities. Good symmetric motor tone is noted throughout.  Sensory: Sensory testing is intact to soft touch on all 4 extremities. No evidence of extinction is noted.  Coordination: Cerebellar testing reveals good finger-nose-finger and heel-to-shin bilaterally.  Gait and station: Gait is normal.  Reflexes: Deep tendon reflexes are symmetric and normal bilaterally.    DIAGNOSTIC DATA (LABS, IMAGING, TESTING) - I reviewed patient records, labs, notes, testing and imaging myself where available.  Lab Results  Component Value Date   WBC 7.0 07/21/2018   HGB 13.6 07/21/2018   HCT 40.6 07/21/2018   MCV 87 07/21/2018   PLT 246 07/21/2018      Component Value Date/Time   NA 140 03/26/2019 1143   NA 143 09/14/2018 1438   K 4.4 03/26/2019 1143   CL 105 03/26/2019 1143   CO2 26 03/26/2019 1143   GLUCOSE 85 03/26/2019 1143   BUN 18 03/26/2019 1143   BUN 16 09/14/2018 1438   CREATININE 1.09 03/26/2019 1143   CREATININE 1.18 (H) 10/01/2016 1622   CALCIUM 9.4 03/26/2019 1143   PROT 7.3 02/27/2019 1023   ALBUMIN 4.4 02/27/2019 1023   AST 21 02/27/2019 1023   ALT 11 02/27/2019 1023   ALKPHOS 101 02/27/2019 1023   BILITOT 0.4 02/27/2019 1023   GFRNONAA 40 (L) 09/14/2018 1438   GFRAA 46 (L) 09/14/2018 1438   Lab Results  Component Value Date   CHOL 142 02/27/2019   HDL 70 02/27/2019   LDLCALC 56 02/27/2019   LDLDIRECT 171.5 12/06/2011   TRIG 78 02/27/2019   CHOLHDL 2.0 02/27/2019   Lab Results  Component Value Date  HGBA1C 6.4 03/26/2019   Lab Results  Component Value Date   [id number] 12/01/2016   Lab Results  Component Value Date   TSH 2.200 09/14/2018      ASSESSMENT AND PLAN 73 y.o. year old female  has a past medical history of Allergy, Anxiety, Arthritis, C. difficile diarrhea, CAD (coronary artery disease), native coronary artery, Cancer ([place]), CKD (chronic kidney disease) stage 3, GFR 30-59 ml/min ([place]) (10/11/2016), Colon polyps (2008), Costochondritis, Diabetes mellitus without complication ([place]), Dyspnea, Gastritis, GERD (gastroesophageal reflux disease), History of echocardiogram, History of nuclear stress test, History of pneumonia, cardiovascular stress test, Hypertension, Neuromuscular disorder ([place]), Neuropathy, Orthostatic hypotension (05/28/2017), Osteoarthritis, Pneumonia, S/P angioplasty with stent 05/27/17 to LCX  with DES  (05/28/2017), and Thyroid disease. here with:  1. Neuropathy  I will increase gabapentin she will continue taking 400 mg in the morning but will increase her dose to 500 mg at bedtime.  I will reorder compounded cream to see if her insurance will cover it this year.  I have advised patient that if her symptoms worsen or she develops new symptoms she should let us know.  She will follow-up in 6 months or sooner if needed.   I spent 15 minutes with the patient. 50% of this time was spent reviewing plan of care   Ward Givens, MSN, NP-C 04/25/2019, 2:13 PM [facility] [address], [place],  [address] [phone]

## 2019-04-25 NOTE — Telephone Encounter (Signed)
Fax confirmation received for transdermal therapeutics cream for neuropathy. [phone], ofv [phone].

## 2019-04-25 NOTE — Patient Instructions (Signed)
Your Plan:  Continue gabapentin 400 mg twice a day- add 100 mg capsule to bedtime dose Compound cream reordered. If your symptoms worsen or you develop new symptoms please let us know.   Thank you for coming to see [place] at [facility]. I hope we have been able to provide you high quality care today.  You may receive a patient satisfaction survey over the next few weeks. We would appreciate your feedback and comments so that we may continue to improve ourselves and the health of our patients.

## 2019-05-05 ENCOUNTER — Ambulatory Visit (INDEPENDENT_AMBULATORY_CARE_PROVIDER_SITE_OTHER): Payer: Medicare HMO

## 2019-05-05 ENCOUNTER — Other Ambulatory Visit: Payer: Self-pay

## 2019-05-05 DIAGNOSIS — Z23 Encounter for immunization: Secondary | ICD-10-CM

## 2019-05-11 ENCOUNTER — Other Ambulatory Visit: Payer: Self-pay | Admitting: Family Medicine

## 2019-05-11 NOTE — Progress Notes (Signed)
Office Visit Note  Patient: [name]             Date of Birth: 1945-10-27           MRN: [id number]             PCP: [name], [name], MD Referring: [name], [name], MD Visit Date: 05/24/2019 Occupation: Retired, Glass blower/designer  Subjective:  Pain in multiple joints   History of Present Illness: [name] is a 73 y.o. female seen in consultation per request of her PCP.  According to patient she has had history of joint pain for at least 4 years.  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  She was diagnosed with right carpal tunnel syndrome and had right carpal tunnel release by Dr. Fredna Dow.  She was found to have some arthritis as well.  And was referred to Dr. Tobie Lords.  He did ultrasound and diagnosed her with synovitis and felt that she may have seronegative rheumatoid arthritis.  Her rheumatoid factor and anti-CCP antibodies were negative at the time per records.  She was tried on colchicine as well because she had chondrocalcinosis in the triangular cartilage.  She had severe diarrhea with colchicine it was discontinued.  She was then tried on sulfasalazine, Plaquenil and methotrexate from which she had an adequate response.  She had some response to [place] which was discontinued.  As she had history of renal cancer Rituxan was discussed but never started.  She was given intermittently prednisone but she does not like taking prednisone.  She was also under care of Dr. Kathlene November for some time.  She was referred to [facility] and was evaluated by Dr. Graylin Shiver who did a thorough evaluation and diagnosed her with chondrocalcinosis.  He did not agree with the diagnosis of seronegative rheumatoid arthritis.  He advised colchicine which she declined.  He also advised low-dose prednisone which she declined.  He advised her to come in as needed for flares and call in for low-dose prednisone.  Patient states she continues to have  intermittent swelling in her hands since then.  She states she has more of an issue with the burning sensation in her hands for which she was recently given gabapentin.  She also has degenerative disc disease in her cervical spine and had discectomy by Dr. Trenton Gammon.  She has lumbar spine surgery x3 in the past.  The last one was in October 2019.  She has some discomfort in her shoulders at times.  Activities of Daily Living:  Patient reports morning stiffness for 30 minutes.   Patient Reports nocturnal pain.  Difficulty dressing/grooming: Denies Difficulty climbing stairs: Reports Difficulty getting out of chair: Reports Difficulty using hands for taps, buttons, cutlery, and/or writing: Reports  Review of Systems  Constitutional: Positive for fatigue. Negative for night sweats, weight gain and weight loss.  HENT: Negative for mouth sores, trouble swallowing, trouble swallowing, mouth dryness and nose dryness.   Eyes: Positive for dryness. Negative for pain, redness and visual disturbance.  Respiratory: Positive for shortness of breath. Negative for cough and difficulty breathing.   Cardiovascular: Negative for chest pain, palpitations, hypertension, irregular heartbeat and swelling in legs/feet.  Gastrointestinal: Positive for constipation and heartburn. Negative for blood in stool and diarrhea.  Endocrine: Negative for cold intolerance and increased urination.  Genitourinary: Negative for painful urination and vaginal dryness.  Musculoskeletal: Positive for arthralgias, gait problem, joint pain, joint  swelling, muscle weakness and morning stiffness. Negative for myalgias, muscle tenderness and myalgias.  Skin: Negative for color change, rash, hair loss, skin tightness, ulcers and sensitivity to sunlight.  Allergic/Immunologic: Negative for susceptible to infections.  Neurological: Positive for numbness. Negative for dizziness, memory loss, night sweats and weakness.  Hematological: Positive for  bruising/bleeding tendency. Negative for swollen glands.  Psychiatric/Behavioral: Positive for depressed mood and sleep disturbance. The patient is not nervous/anxious.     PMFS History:  Patient Active Problem List   Diagnosis Date Noted   Anxiety disorder, unspecified 03/26/2019   Tobacco use disorder 09/29/2018   Cervical myelopathy ([place]) 05/29/2018   Cervicalgia 09/16/2017   Orthostatic hypotension 05/28/2017   S/P angioplasty with stent 05/27/17 to LCX with DES  05/28/2017   CAD (coronary artery disease)    Polyneuropathy 05/25/2017   Chest pain with moderate risk for cardiac etiology 05/25/2017   Class 1 obesity with serious comorbidity and body mass index (BMI) of 34.0 to 34.9 in adult 12/06/2016   CKD (chronic kidney disease) stage 3, GFR 30-59 ml/min 10/11/2016   Hot flashes, menopausal 10/01/2016   Chest pain with moderate risk of acute coronary syndrome 10/25/2014   Degenerative spondylolisthesis 10/15/2014   Spinal stenosis, lumbar region, with neurogenic claudication 02/12/2013   DM (diabetes mellitus), type 2 with renal complications ([place]) 123XX123   History of Rtr nephrectomy 2010, secondary to renal cell cancer 01/14/2009   PERSONAL HX COLONIC POLYPS 11/14/2008   Hyperlipidemia associated with type 2 diabetes mellitus ([place]) 11/16/2007   ESOPHAGEAL STRICTURE 10/12/2007   Rheumatoid arthritis ([place]) 02/27/2007   Essential hypertension 01/19/2007   Allergic rhinitis 01/19/2007   GERD 01/19/2007    Past Medical History:  Diagnosis Date   Allergy    Anxiety    Arthritis    RA (Dr. Ouida Sills) Bilateral hands   C. difficile diarrhea    history of   CAD (coronary artery disease), native coronary artery    a. Cardiac CT with + FFR for Ramus b. cath 05/2017 100% sub acute lesion to mid circumflex artery s/p DES.   Cancer [facility])    Right kidney CA removed.    CKD (chronic kidney disease) stage 3, GFR 30-59 ml/min 10/11/2016   s/p R  nephrectomy   Colon polyps 2008   HYPERPLASTIC   Costochondritis    a. Nuc Stress Test 6/16: EF 70, no scar or ischemia, low risk // b. Echo 3/16: mild conc LVH, EF 65-70, no RWMA, Gr 1 DD, mild TR   Diabetes mellitus without complication (HCC)    type 2  no meds   Dyspnea    Gastritis    GERD (gastroesophageal reflux disease)    History of echocardiogram    Echo 3/18:  Moderate LVH, EF [id number], grade 1 diastolic dysfunction, calcified aortic valve, mild MR, moderate LAE   History of nuclear stress test    Myoview 3/18: Mod size and intensity fixed septal defect, may be artifact.Opposite mod size and intensity lat defect, which is reversible and could represent ischemia or possibly artifact (SDS 4). LVEF 71% with normal wall motion. Intermediate risk study. >> images reviewed with Dr. Dorris Carnes - no sig ischemia; med rx    History of pneumonia    Hx of cardiovascular stress test    Lexiscan Myoview 6/16:  EF 70%, no scar or ischemia; Low Risk   Hypertension    Neuromuscular disorder ([place])    Neruopathy in bilateral feet   Neuropathy    Orthostatic  hypotension 05/28/2017   Osteoarthritis    Pneumonia    S/P angioplasty with stent 05/27/17 to LCX with DES  05/28/2017   Thyroid disease     Family History  Problem Relation Age of Onset   Rheum arthritis Mother    Stroke Mother    Prostate cancer Father    Heart disease Father    Diabetes Brother    Dementia Brother    Parkinsonism Brother    Kidney disease Son    Diabetes Brother    Cancer Brother    Dementia Brother    Colon cancer Neg Hx    Esophageal cancer Neg Hx    Pancreatic cancer Neg Hx    Liver disease Neg Hx    Past Surgical History:  Procedure Laterality Date   ABDOMINAL HYSTERECTOMY  1978   ANTERIOR CERVICAL DECOMP/DISCECTOMY FUSION N/A 05/29/2018   Procedure: ANTERIOR CERVICAL DECOMPRESSION FUSION - CERVICAL FIVE-CERVICAL SIX - CERVICAL SIX-CERVICAL SEVEN;  Surgeon: Earnie Larsson, MD;  Location: [place];  Service: Neurosurgery;  Laterality: N/A;   BACK SURGERY     x 2   CARPAL TUNNEL RELEASE Left    COLONOSCOPY W/ BIOPSIES AND POLYPECTOMY     Hx: of   CORONARY STENT INTERVENTION N/A 05/27/2017   Procedure: CORONARY STENT INTERVENTION;  Surgeon: Burnell Blanks, MD;  Location: [place] [facility];  Service: Cardiovascular;  Laterality: N/A;   ESOPHAGOGASTRODUODENOSCOPY     HNP     LEFT HEART CATH AND CORONARY ANGIOGRAPHY N/A 05/27/2017   Procedure: LEFT HEART CATH AND CORONARY ANGIOGRAPHY;  Surgeon: Burnell Blanks, MD;  Location: [place] [facility];  Service: Cardiovascular;  Laterality: N/A;   LUMBAR LAMINECTOMY/DECOMPRESSION MICRODISCECTOMY Left 02/12/2013   Procedure: LUMBAR TWO THREE, LUMBAR THREE FOUR, LUMBAR FOUR FIVE  LAMINECTOMY/DECOMPRESSION MICRODISCECTOMY 3 LEVELS;  Surgeon: Charlie Pitter, MD;  Location: [place] [facility];  Service: Neurosurgery;  Laterality: Left;   NEPHRECTOMY Right 2010   10.rcc cancer   TOTAL KNEE ARTHROPLASTY Right    Redo   Social History   Social History Narrative   Single, dgtr lives with her   Never Smoked    Alcohol use- no   Drug use-no   Regular Exercise-yes   Former Smoker- 12/2008   Immunization History  Administered Date(s) Administered   Fluad Quad(high Dose 65+) 05/05/2019   Influenza Split 05/23/2012, 06/07/2013   Influenza Whole 08/02/2005, 05/08/2007, 05/09/2008, 08/19/2009, 06/03/2010   Influenza, High Dose Seasonal PF 06/20/2014, 05/13/2015, 06/08/2016, 05/23/2017, 05/18/2018   Pneumococcal Conjugate-13 02/27/2007   Pneumococcal Polysaccharide-23 02/27/2007, 12/16/2011, 10/16/2014, 05/26/2017   Td 08/02/2001   Tdap 12/16/2011   Zoster 12/27/2012     Objective: Vital Signs: BP (!) 150/79 (BP Location: Right Arm, Patient Position: Sitting, Cuff Size: Large)    Pulse 71    Resp 18    Ht 5\' 6"  (1.676 m)    Wt 224 lb (101.6 kg)    BMI 36.15 kg/m    Physical Exam Vitals  signs and nursing note reviewed.  Constitutional:      Appearance: She is well-developed.  HENT:     Head: Normocephalic and atraumatic.  Eyes:     Conjunctiva/sclera: Conjunctivae normal.  Neck:     Musculoskeletal: Normal range of motion.  Cardiovascular:     Rate and Rhythm: Normal rate and regular rhythm.     Heart sounds: Normal heart sounds.  Pulmonary:     Effort: Pulmonary effort is normal.     Breath sounds: Normal  breath sounds.  Abdominal:     General: Bowel sounds are normal.     Palpations: Abdomen is soft.  Lymphadenopathy:     Cervical: No cervical adenopathy.  Skin:    General: Skin is warm and dry.     Capillary Refill: Capillary refill takes less than 2 seconds.  Neurological:     Mental Status: She is alert and oriented to person, place, and time.  Psychiatric:        Behavior: Behavior normal.      Musculoskeletal Exam: She has limited range of motion of cervical spine.  She had painful range of motion of her right shoulder joint.  Elbow joints were in good range of motion.  She has some swelling over the left wrist joint.  MCPs PIPs and DIPs with good range of motion with no synovitis.  She has prominence of bilateral CMC joints.  Hip joints with good range of motion.  Right knee joint is replaced.  Left knee joint had no warmth or swelling.  There was mild tenderness across MTPs but no synovitis was noted.  CDAI Exam: CDAI Score: -- Patient Global: --; Provider Global: -- Swollen: --; Tender: -- Joint Exam   No joint exam has been documented for this visit   There is currently no information documented on the homunculus. Go to the Rheumatology activity and complete the homunculus joint exam.  Investigation: No additional findings.  Imaging: No results found.  Recent Labs: Lab Results  Component Value Date   WBC 7.0 07/21/2018   HGB 13.6 07/21/2018   PLT 246 07/21/2018   NA 140 03/26/2019   K 4.4 03/26/2019   CL 105 03/26/2019   CO2 26  03/26/2019   GLUCOSE 85 03/26/2019   BUN 18 03/26/2019   CREATININE 1.09 03/26/2019   BILITOT 0.4 02/27/2019   ALKPHOS 101 02/27/2019   AST 21 02/27/2019   ALT 11 02/27/2019   PROT 7.3 02/27/2019   ALBUMIN 4.4 02/27/2019   CALCIUM 9.4 03/26/2019   GFRAA 46 (L) 09/14/2018    Speciality Comments: No specialty comments available.  Procedures:  No procedures performed Allergies: Oxycodone-acetaminophen, Percocet [oxycodone-acetaminophen], Pravastatin sodium, Hydrocodone, and Aspirin   Assessment / Plan:     Visit Diagnoses: Inflammatory arthritis-patient had been under care of Dr. Tobie Lords, Dr. Kathlene November and was seen by Dr. Graylin Shiver at [facility].  I had detailed review of the records.  Patient had ultrasound which showed mild synovitis.  The x-rays in the past showed chondrocalcinosis.  She was tried on Plaquenil, methotrexate, sulfasalazine without adequate response.  With [place] she felt some improvement in her symptoms.  She was also given colchicine but she had diarrhea and discontinued the medication.  Her rheumatoid factor and anti-CCP had been negative in the past.  She had low titer ANA positive in the past.  Dr.Thapa at [facility] felt that her symptoms were more consistent with chondrocalcinosis and osteoarthritis.  He offered low-dose prednisone which she declined.  She has not seen a rheumatologist in a while.  She gives history of intermittent swelling in her hands.  I noticed some swelling in her left wrist joint today.  None of the other joints are inflamed.  Pain in both hands - Plan: XR Hand 2 View Right, XR Hand 2 View Left, x-rays were consistent with osteoarthritis and chondrocalcinosis.  The left wrist showed postinflammatory changes and chondrocalcinosis in the triangular cartilage.  Iron, TIBC and Ferritin Panel, Magnesium, Uric acid, Rheumatoid factor,  Cyclic citrul peptide antibody, IgG, 14-3-3 eta Protein  Pain in both feet - Plan: XR Foot 2 Views  Right, XR Foot 2 Views Left.  X-ray of bilateral feet are consistent with osteoarthritis.  Inferior calcaneal spurs were noted.  Chondrocalcinosis-chondrocalcinosis has been noted in the triangular cartilage of her wrist joint and also in her cervical spine in the past.  Status post total knee replacement, right-she is chronic discomfort has difficulty walking.  Cervical myelopathy (HCC)-she had laminectomy in the past by Dr. Trenton Gammon and continues to have some discomfort.  Spinal stenosis, lumbar region, with neurogenic claudication-patient reports having lumbar spine surgery x3 in the past.  Polyneuropathy-she is neuropathy symptoms in both upper and lower extremities.  She has been on gabapentin.  Type 2 diabetes mellitus with stage 3 chronic kidney disease, without long-term current use of insulin, unspecified whether stage 3a or 3b CKD (HCC)-she states her diabetes is well controlled.  Her last GFR was in 40s.  Hyperlipidemia associated with type 2 diabetes mellitus ([place])  Coronary artery disease involving native coronary artery of native heart without angina pectoris  S/P angioplasty with stent 05/27/17 to LCX with DES   Essential hypertension  Orthostatic hypotension  Esophageal stricture  History of gastroesophageal reflux (GERD)  History of Rtr nephrectomy 2010, secondary to renal cell cancer  Tobacco use disorder  Orders: Orders Placed This Encounter  Procedures   XR Hand 2 View Right   XR Hand 2 View Left   XR Foot 2 Views Right   XR Foot 2 Views Left   Iron, TIBC and Ferritin Panel   Magnesium   Uric acid   Rheumatoid factor   Cyclic citrul peptide antibody, IgG   14-3-3 eta Protein   No orders of the defined types were placed in this encounter.   Face-to-face time spent with patient was 60 minutes. Greater than 50% of time was spent in counseling and coordination of care.  Follow-Up Instructions: Return for Inflammatory arthritis.   Bo Merino, MD  Note - This record has been created using Editor, commissioning.  Chart creation errors have been sought, but may not always  have been located. Such creation errors do not reflect on  the standard of medical care.

## 2019-05-23 ENCOUNTER — Other Ambulatory Visit: Payer: Self-pay | Admitting: Family Medicine

## 2019-05-24 ENCOUNTER — Other Ambulatory Visit: Payer: Self-pay

## 2019-05-24 ENCOUNTER — Encounter: Payer: Self-pay | Admitting: Rheumatology

## 2019-05-24 ENCOUNTER — Ambulatory Visit (INDEPENDENT_AMBULATORY_CARE_PROVIDER_SITE_OTHER): Payer: Medicare HMO

## 2019-05-24 ENCOUNTER — Ambulatory Visit (INDEPENDENT_AMBULATORY_CARE_PROVIDER_SITE_OTHER): Payer: Medicare HMO | Admitting: Rheumatology

## 2019-05-24 ENCOUNTER — Ambulatory Visit: Payer: Self-pay

## 2019-05-24 VITALS — BP 150/79 | HR 71 | Resp 18 | Ht 66.0 in | Wt 224.0 lb

## 2019-05-24 DIAGNOSIS — G959 Disease of spinal cord, unspecified: Secondary | ICD-10-CM

## 2019-05-24 DIAGNOSIS — M79642 Pain in left hand: Secondary | ICD-10-CM

## 2019-05-24 DIAGNOSIS — M48062 Spinal stenosis, lumbar region with neurogenic claudication: Secondary | ICD-10-CM | POA: Diagnosis not present

## 2019-05-24 DIAGNOSIS — Z95820 Peripheral vascular angioplasty status with implants and grafts: Secondary | ICD-10-CM

## 2019-05-24 DIAGNOSIS — G629 Polyneuropathy, unspecified: Secondary | ICD-10-CM

## 2019-05-24 DIAGNOSIS — E1169 Type 2 diabetes mellitus with other specified complication: Secondary | ICD-10-CM | POA: Diagnosis not present

## 2019-05-24 DIAGNOSIS — Z905 Acquired absence of kidney: Secondary | ICD-10-CM

## 2019-05-24 DIAGNOSIS — E785 Hyperlipidemia, unspecified: Secondary | ICD-10-CM

## 2019-05-24 DIAGNOSIS — Z96651 Presence of right artificial knee joint: Secondary | ICD-10-CM

## 2019-05-24 DIAGNOSIS — F17200 Nicotine dependence, unspecified, uncomplicated: Secondary | ICD-10-CM

## 2019-05-24 DIAGNOSIS — M79672 Pain in left foot: Secondary | ICD-10-CM | POA: Diagnosis not present

## 2019-05-24 DIAGNOSIS — M1120 Other chondrocalcinosis, unspecified site: Secondary | ICD-10-CM

## 2019-05-24 DIAGNOSIS — I951 Orthostatic hypotension: Secondary | ICD-10-CM

## 2019-05-24 DIAGNOSIS — N183 Chronic kidney disease, stage 3 unspecified: Secondary | ICD-10-CM

## 2019-05-24 DIAGNOSIS — I10 Essential (primary) hypertension: Secondary | ICD-10-CM

## 2019-05-24 DIAGNOSIS — K222 Esophageal obstruction: Secondary | ICD-10-CM

## 2019-05-24 DIAGNOSIS — Z8719 Personal history of other diseases of the digestive system: Secondary | ICD-10-CM

## 2019-05-24 DIAGNOSIS — M79671 Pain in right foot: Secondary | ICD-10-CM | POA: Diagnosis not present

## 2019-05-24 DIAGNOSIS — M1990 Unspecified osteoarthritis, unspecified site: Secondary | ICD-10-CM | POA: Diagnosis not present

## 2019-05-24 DIAGNOSIS — M79641 Pain in right hand: Secondary | ICD-10-CM

## 2019-05-24 DIAGNOSIS — E1122 Type 2 diabetes mellitus with diabetic chronic kidney disease: Secondary | ICD-10-CM | POA: Diagnosis not present

## 2019-05-24 DIAGNOSIS — I2510 Atherosclerotic heart disease of native coronary artery without angina pectoris: Secondary | ICD-10-CM

## 2019-05-28 ENCOUNTER — Encounter (HOSPITAL_COMMUNITY): Payer: Self-pay

## 2019-05-28 ENCOUNTER — Other Ambulatory Visit: Payer: Self-pay | Admitting: Family Medicine

## 2019-05-28 ENCOUNTER — Ambulatory Visit (HOSPITAL_COMMUNITY)
Admission: EM | Admit: 2019-05-28 | Discharge: 2019-05-28 | Disposition: A | Discharge status: Home / Self Care | Payer: Medicare HMO | Attending: Family Medicine | Admitting: Family Medicine

## 2019-05-28 ENCOUNTER — Ambulatory Visit: Payer: Self-pay

## 2019-05-28 ENCOUNTER — Other Ambulatory Visit: Payer: Self-pay

## 2019-05-28 DIAGNOSIS — I10 Essential (primary) hypertension: Secondary | ICD-10-CM

## 2019-05-28 MED ORDER — LOSARTAN POTASSIUM 50 MG PO TABS: 50.0000 mg | ORAL_TABLET | Freq: Every day | ORAL | 0 refills | Status: DC

## 2019-05-28 MED ORDER — METOPROLOL TARTRATE 25 MG PO TABS: 25.0000 mg | ORAL_TABLET | Freq: Two times a day (BID) | ORAL | 0 refills | Status: DC

## 2019-05-28 NOTE — Telephone Encounter (Signed)
Routing to PCP

## 2019-05-28 NOTE — ED Provider Notes (Signed)
[facility]    CSN: [id number] Arrival date & time: 05/28/19  1615      History   Chief Complaint Chief Complaint  Patient presents with  . Hypertension    HPI [name] is a 73 y.o. female.   HPI  Patient takes her blood pressure at home 3 or more times a day.  She gets upset when the blood pressure is elevated.  Then she takes it again.  She has had some high readings the last couple of days.  180/100 range.  When she notes her blood pressure to be this she sometimes has a headache.  She is also been feeling tired.  She is recently gotten over an upper respiratory infection.  She is concerned about her elevated blood pressure. I explained her that she does not need to take her blood pressure more than once a day.  She should take it if she does not feel well, otherwise she does not need to take it so often.  Her blood pressure is usually well controlled, she should relax and not worry so much about it.  Her blood pressure here is pretty good with only of slightly high systolic.  She needs her blood pressure medicines refilled.  This is done for her.  Past Medical History:  Diagnosis Date  . Allergy   . Anxiety   . Arthritis    RA (Dr. Ouida Sills) Bilateral hands  . C. difficile diarrhea    history of  . CAD (coronary artery disease), native coronary artery    a. Cardiac CT with + FFR for Ramus b. cath 05/2017 100% sub acute lesion to mid circumflex artery s/p DES.  . Cancer ([place])    Right kidney CA removed.   . CKD (chronic kidney disease) stage 3, GFR 30-59 ml/min 10/11/2016   s/p R nephrectomy  . Colon polyps 2008   HYPERPLASTIC  . Costochondritis    a. Nuc Stress Test 6/16: EF 70, no scar or ischemia, low risk // b. Echo 3/16: mild conc LVH, EF 65-70, no RWMA, Gr 1 DD, mild TR  . Diabetes mellitus without complication ([place])    type 2  no meds  . Dyspnea   . Gastritis   . GERD (gastroesophageal reflux disease)   . History of echocardiogram    Echo 3/18:   Moderate LVH, EF [id number], grade 1 diastolic dysfunction, calcified aortic valve, mild MR, moderate LAE  . History of nuclear stress test    Myoview 3/18: Mod size and intensity fixed septal defect, may be artifact.Opposite mod size and intensity lat defect, which is reversible and could represent ischemia or possibly artifact (SDS 4). LVEF 71% with normal wall motion. Intermediate risk study. >> images reviewed with Dr. Dorris Carnes - no sig ischemia; med rx   . History of pneumonia   . Hx of cardiovascular stress test    Lexiscan Myoview 6/16:  EF 70%, no scar or ischemia; Low Risk  . Hypertension   . Neuromuscular disorder ([place])    Neruopathy in bilateral feet  . Neuropathy   . Orthostatic hypotension 05/28/2017  . Osteoarthritis   . Pneumonia   . S/P angioplasty with stent 05/27/17 to LCX with DES  05/28/2017  . Thyroid disease     Patient Active Problem List   Diagnosis Date Noted  . Anxiety disorder, unspecified 03/26/2019  . Tobacco use disorder 09/29/2018  . Cervical myelopathy ([place]) 05/29/2018  . Cervicalgia 09/16/2017  . Orthostatic hypotension 05/28/2017  .  S/P angioplasty with stent 05/27/17 to LCX with DES  05/28/2017  . CAD (coronary artery disease)   . Polyneuropathy 05/25/2017  . Chest pain with moderate risk for cardiac etiology 05/25/2017  . Class 1 obesity with serious comorbidity and body mass index (BMI) of 34.0 to 34.9 in adult 12/06/2016  . CKD (chronic kidney disease) stage 3, GFR 30-59 ml/min 10/11/2016  . Hot flashes, menopausal 10/01/2016  . Chest pain with moderate risk of acute coronary syndrome 10/25/2014  . Degenerative spondylolisthesis 10/15/2014  . Spinal stenosis, lumbar region, with neurogenic claudication 02/12/2013  . DM (diabetes mellitus), type 2 with renal complications ([place]) [id number]  . History of Rtr nephrectomy 2010, secondary to renal cell cancer 01/14/2009  . PERSONAL HX COLONIC POLYPS 11/14/2008  . Hyperlipidemia associated with type 2  diabetes mellitus ([place]) 11/16/2007  . ESOPHAGEAL STRICTURE 10/12/2007  . Rheumatoid arthritis ([place]) 02/27/2007  . Essential hypertension 01/19/2007  . Allergic rhinitis 01/19/2007  . GERD 01/19/2007    Past Surgical History:  Procedure Laterality Date  . ABDOMINAL HYSTERECTOMY  1978  . ANTERIOR CERVICAL DECOMP/DISCECTOMY FUSION N/A 05/29/2018   Procedure: ANTERIOR CERVICAL DECOMPRESSION FUSION - CERVICAL FIVE-CERVICAL SIX - CERVICAL SIX-CERVICAL SEVEN;  Surgeon: Earnie Larsson, MD;  Location: [facility];  Service: Neurosurgery;  Laterality: N/A;  . BACK SURGERY     x 2  . CARPAL TUNNEL RELEASE Left   . COLONOSCOPY W/ BIOPSIES AND POLYPECTOMY     Hx: of  . CORONARY STENT INTERVENTION N/A 05/27/2017   Procedure: CORONARY STENT INTERVENTION;  Surgeon: Burnell Blanks, MD;  Location: [facility];  Service: Cardiovascular;  Laterality: N/A;  . ESOPHAGOGASTRODUODENOSCOPY    . HNP    . LEFT HEART CATH AND CORONARY ANGIOGRAPHY N/A 05/27/2017   Procedure: LEFT HEART CATH AND CORONARY ANGIOGRAPHY;  Surgeon: Burnell Blanks, MD;  Location: [facility];  Service: Cardiovascular;  Laterality: N/A;  . LUMBAR LAMINECTOMY/DECOMPRESSION MICRODISCECTOMY Left 02/12/2013   Procedure: LUMBAR TWO THREE, LUMBAR THREE FOUR, LUMBAR FOUR FIVE  LAMINECTOMY/DECOMPRESSION MICRODISCECTOMY 3 LEVELS;  Surgeon: Charlie Pitter, MD;  Location: [facility];  Service: Neurosurgery;  Laterality: Left;  . NEPHRECTOMY Right 2010   10.rcc cancer  . TOTAL KNEE ARTHROPLASTY Right    Redo    OB History   No obstetric history on file.      Home Medications    Prior to Admission medications   Medication Sig Start Date End Date Taking? Authorizing Provider  acetaminophen (TYLENOL) 500 MG tablet Take 1,000 mg by mouth every 6 (six) hours as needed for moderate pain or headache.     [provider]  aspirin 81 MG chewable tablet Chew 81 mg by mouth daily.    [provider]   clopidogrel (PLAVIX) 75 MG tablet TAKE ONE TABLET BY MOUTH ONE TIME DAILY  09/27/18   Richardson Dopp T, PA-C  diclofenac sodium (VOLTAREN) 1 % GEL Apply 2 g topically 3 (three) times daily as needed (for arm pain).    [provider]  DULoxetine (CYMBALTA) 30 MG capsule Take 1 capsule (30 mg total) by mouth daily. 03/26/19   [name], [name], MD  famotidine (PEPCID) 20 MG tablet TAKE 1 TABLET BY MOUTH TWICE DAILY AS NEEDED FOR HEARTBURN OR INDIGESTION 05/11/19   [name], [name], MD  fluticasone [facility]) 50 MCG/ACT nasal spray Place 2 sprays into both nostrils at bedtime as needed for allergies. 10/06/18   [name], [name], MD  gabapentin (NEURONTIN) 100  MG capsule Take 1 capsule (100 mg total) by mouth 2 (two) times daily. 04/25/19   Ward Givens, NP  gabapentin (NEURONTIN) 400 MG capsule Take 1 capsule (400 mg total) by mouth 2 (two) times daily. 11/21/18   Penumalli, Vikram R, MD  ipratropium (ATROVENT) 0.06 % nasal spray PLACE 2 SPRAYS INTO BOTH NOSTRILS 4 (FOUR) TIMES DAILY. 10/04/18   [name], [name], MD  losartan (COZAAR) 50 MG tablet TAKE ONE HALF TABLET BY MOUTH DAILY 05/25/19   [name], [name], MD  losartan (COZAAR) 50 MG tablet Take 1 tablet (50 mg total) by mouth daily. 05/28/19   Raylene Everts, MD  metoprolol tartrate (LOPRESSOR) 25 MG tablet Take 1 tablet (25 mg total) by mouth 2 (two) times daily. 05/28/19   Raylene Everts, MD  Multiple Vitamin (MULTIVITAMIN) tablet Take 1 tablet by mouth daily.    [provider]  nicotine (NICODERM CQ - DOSED IN MG/24 HOURS) 14 mg/24hr patch Place 1 patch (14 mg total) onto the skin daily. 09/29/18   [name], [name], MD  nitroGLYCERIN (NITROSTAT) 0.4 MG SL tablet Place 1 tablet (0.4 mg total) under the tongue every 5 (five) minutes as needed for chest pain. 05/28/17   Isaiah Serge, NP  polyethylene glycol powder (GLYCOLAX/MIRALAX) powder Take 17 g by mouth daily. Patient taking differently: Take 17 g by mouth every other day.   05/03/17   Levin Erp, PA  rosuvastatin (CRESTOR) 40 MG tablet Take 1 tablet (40 mg total) by mouth daily. 09/14/18   Daune Perch, NP  Vitamin D, Ergocalciferol, (DRISDOL) 1.25 MG (50000 UT) CAPS capsule 1 cap weekly x 8 weeks then every 2 weeks. 03/29/19   [name], [name], MD    Family History Family History  Problem Relation Age of Onset  . Rheum arthritis Mother   . Stroke Mother   . Prostate cancer Father   . Heart disease Father   . Diabetes Brother   . Dementia Brother   . Parkinsonism Brother   . Kidney disease Son   . Diabetes Brother   . Cancer Brother   . Dementia Brother   . Colon cancer Neg Hx   . Esophageal cancer Neg Hx   . Pancreatic cancer Neg Hx   . Liver disease Neg Hx     Social History Social History   Tobacco Use  . Smoking status: Current Every Day Smoker    Packs/day: 0.20    Years: 15.00    Pack years: 3.00    Types: Cigarettes  . Smokeless tobacco: Never Used  . Tobacco comment: 03/16/17  1 pk/week  Substance Use Topics  . Alcohol use: No    Alcohol/week: 0.0 standard drinks  . Drug use: No     Allergies   Oxycodone-acetaminophen, Percocet [oxycodone-acetaminophen], Pravastatin sodium, Hydrocodone, and Aspirin   Review of Systems Review of Systems  Constitutional: Positive for fatigue. Negative for chills and fever.  HENT: Negative for ear pain and sore throat.   Eyes: Negative for pain and visual disturbance.  Respiratory: Negative for cough and shortness of breath.   Cardiovascular: Negative for chest pain and palpitations.  Gastrointestinal: Negative for abdominal pain and vomiting.  Genitourinary: Negative for dysuria and hematuria.  Musculoskeletal: Negative for arthralgias and back pain.  Skin: Negative for color change and rash.  Neurological: Positive for headaches. Negative for seizures and syncope.  All other systems reviewed and are negative.    Physical Exam Triage Vital Signs ED Triage Vitals  Enc  Vitals Group     BP 05/28/19 1703 (!) 157/84     Pulse Rate 05/28/19 1703 66     Resp 05/28/19 1703 16     Temp 05/28/19 1703 97.9 F (36.6 C)     Temp Source 05/28/19 1703 Temporal     SpO2 05/28/19 1703 99 %     Weight --      Height --      Head Circumference --      Peak Flow --      Pain Score 05/28/19 1702 4     Pain Loc --      Pain Edu? --      Excl. in [place]? --    No data found.  Updated Vital Signs BP (!) 157/84 (BP Location: Right Arm)   Pulse 66   Temp 97.9 F (36.6 C) (Temporal)   Resp 16   SpO2 99%      Physical Exam Constitutional:      General: She is not in acute distress.    Appearance: She is well-developed and normal weight.  HENT:     Head: Normocephalic and atraumatic.     Mouth/Throat:     Comments: Mask in place Eyes:     Conjunctiva/sclera: Conjunctivae normal.     Pupils: Pupils are equal, round, and reactive to light.  Neck:     Musculoskeletal: Normal range of motion.  Cardiovascular:     Rate and Rhythm: Normal rate and regular rhythm.     Heart sounds: Normal heart sounds.  Pulmonary:     Effort: Pulmonary effort is normal. No respiratory distress.     Breath sounds: Normal breath sounds.  Abdominal:     General: There is no distension.     Palpations: Abdomen is soft.  Musculoskeletal: Normal range of motion.  Skin:    General: Skin is warm and dry.  Neurological:     General: No focal deficit present.     Mental Status: She is alert.     Coordination: Coordination normal.     Gait: Gait normal.  Psychiatric:        Mood and Affect: Mood normal.        Behavior: Behavior normal.      UC Treatments / Results  Labs (all labs ordered are listed, but only abnormal results are displayed) Labs Reviewed - No data to display  EKG   Radiology No results found.  Procedures Procedures (including critical care time)  Medications Ordered in UC Medications - No data to display  Initial Impression / Assessment and Plan /  UC Course  I have reviewed the triage vital signs and the nursing notes.  Pertinent labs & imaging results that were available during my care of the patient were reviewed by me and considered in my medical decision making (see chart for details).     Patient reassured. Final Clinical Impressions(s) / UC Diagnoses   Final diagnoses:  Essential hypertension     Discharge Instructions     Continue the BP medicine daily Call your PCP for follow up   ED Prescriptions    Medication Sig Dispense Auth. Provider   metoprolol tartrate (LOPRESSOR) 25 MG tablet Take 1 tablet (25 mg total) by mouth 2 (two) times daily. 180 tablet Raylene Everts, MD   losartan (COZAAR) 50 MG tablet Take 1 tablet (50 mg total) by mouth daily. 90 tablet Raylene Everts, MD     PDMP not reviewed this  encounter.   Raylene Everts, MD 05/28/19 2114

## 2019-05-28 NOTE — Telephone Encounter (Signed)
Patient called stating that for a couple days her BP has been high.  She states that today BP was 189/100.  She denies symptoms today but states she had a headache yesterday. Today she just feels tired. She denies other symptoms. She has not missed any does of medication. Care advice read to patient.  She verbalized understanding of all information.  She will go to UC today for evaluation of her BP. Office has no open appointments. Note will be routed to office.  Reason for Disposition . Systolic BP  >= 99991111 OR Diastolic >= [id number]  Answer Assessment - Initial Assessment Questions 1. BLOOD PRESSURE: "What is the blood pressure?" "Did you take at least two measurements 5 minutes apart?"     180/100,  2. ONSET: "When did you take your blood pressure?"    2-3 days 3. HOW: "How did you obtain the blood pressure?" (e.g., visiting nurse, automatic home BP monitor)     Home BP monitor 4. HISTORY: "Do you have a history of high blood pressure?"     yes 5. MEDICATIONS: "Are you taking any medications for blood pressure?" "Have you missed any doses recently?"     no 6. OTHER SYMPTOMS: "Do you have any symptoms?" (e.g., headache, chest pain, blurred vision, difficulty breathing, weakness)    Headache yesterday but no energy today 7. PREGNANCY: "Is there any chance you are pregnant?" "When was your last menstrual period?"     N/A  Protocols used: HIGH BLOOD PRESSURE-A-AH

## 2019-05-28 NOTE — Discharge Instructions (Signed)
Continue the BP medicine daily Call your PCP for follow up

## 2019-05-28 NOTE — ED Triage Notes (Signed)
Patient presents to Urgent Care with complaints of hypertension since the past few days. Patient reports she was not able to get in with her pcp so they told her to come here. Pt states bp was 190/101 at home, has had slight headache and dizziness. Pt takes losartan and metoprolol for BP, has not missed any doses but did take her last one today and may need a refill (paper copy to take to [facility]).

## 2019-05-28 NOTE — Progress Notes (Signed)
I reviewed note and agree with plan.   Penni Bombard, MD [id number], 0000000 AM Certified in Neurology, Neurophysiology and Neuroimaging  [facility] [address], [place], [place] [address] [phone]

## 2019-05-29 ENCOUNTER — Other Ambulatory Visit: Payer: Self-pay | Admitting: Internal Medicine

## 2019-05-29 ENCOUNTER — Telehealth: Payer: Self-pay | Admitting: Internal Medicine

## 2019-05-29 MED ORDER — LOSARTAN POTASSIUM 50 MG PO TABS: 50.0000 mg | ORAL_TABLET | Freq: Every day | ORAL | 2 refills | Status: DC

## 2019-05-29 NOTE — Telephone Encounter (Signed)
Pt has been scheduled.  °

## 2019-05-29 NOTE — Telephone Encounter (Signed)
Pt c/o BP issue: STAT if pt c/o blurred vision, one-sided weakness or slurred speech  1. What are your last 5 BP readings? N/A  2. Are you having any other symptoms (ex. Dizziness, headache, blurred vision, passed out)? headached and blurred vision  3. What is your BP issue? Elevated BP, wants to know if her medication is causing this.

## 2019-05-29 NOTE — Telephone Encounter (Signed)
LMTCB... was seen in the ED yesterday for BP.

## 2019-05-29 NOTE — Telephone Encounter (Signed)
It seems like she was in the ER yesterday. I do not see appt arranged, can you please schedule ER f/u visit. Thanks, BJ

## 2019-05-29 NOTE — Telephone Encounter (Signed)
Pt's medication was sent to pt's pharmacy as requested. Confirmation received.  °

## 2019-05-29 NOTE — Telephone Encounter (Signed)
Pt called back to report that her BP has been elevated so she went to the ED yesterday since she had a headache and her vision was "off".... her BP there was 157/84. They went over her ned list and she realized she was mistaken and had been taking 1/2 of her  Losartan 50mg ... but they advised her to take a whole tablet.. she says her BP today is 150/88 and she is feeling mush better. She also had some trouble getting her refills at [facility]...   Pt is seeing her PCP.. Dr. [name] tomorrow and will talk with her re: her BP and meds. She will call back if she has any further problems or questions.

## 2019-05-30 ENCOUNTER — Encounter: Payer: Self-pay | Admitting: Family Medicine

## 2019-05-30 ENCOUNTER — Other Ambulatory Visit: Payer: Self-pay

## 2019-05-30 ENCOUNTER — Ambulatory Visit (INDEPENDENT_AMBULATORY_CARE_PROVIDER_SITE_OTHER): Payer: Medicare HMO | Admitting: Family Medicine

## 2019-05-30 VITALS — BP 200/100 | HR 69 | Temp 97.6°F | Resp 16 | Ht 66.0 in | Wt 224.0 lb

## 2019-05-30 DIAGNOSIS — H1131 Conjunctival hemorrhage, right eye: Secondary | ICD-10-CM

## 2019-05-30 DIAGNOSIS — N1831 Chronic kidney disease, stage 3a: Secondary | ICD-10-CM | POA: Diagnosis not present

## 2019-05-30 DIAGNOSIS — I25110 Atherosclerotic heart disease of native coronary artery with unstable angina pectoris: Secondary | ICD-10-CM

## 2019-05-30 DIAGNOSIS — I119 Hypertensive heart disease without heart failure: Secondary | ICD-10-CM | POA: Diagnosis not present

## 2019-05-30 LAB — IRON,TIBC AND FERRITIN PANEL
%SAT: 18 % (calc) (ref 16–45)
Ferritin: 26 ng/mL (ref 16–288)
Iron: 56 ug/dL (ref 45–160)
TIBC: 320 mcg/dL (calc) (ref 250–450)

## 2019-05-30 LAB — CYCLIC CITRUL PEPTIDE ANTIBODY, IGG: Cyclic Citrullin Peptide Ab: <16 UNITS

## 2019-05-30 LAB — MAGNESIUM: Magnesium: 2 mg/dL (ref 1.5–2.5)

## 2019-05-30 LAB — RHEUMATOID FACTOR: Rheumatoid fact SerPl-aCnc: 17 IU/mL — ABNORMAL HIGH (ref <14)

## 2019-05-30 LAB — URIC ACID: Uric Acid, Serum: 6.2 mg/dL (ref 2.5–7.0)

## 2019-05-30 LAB — 14-3-3 ETA PROTEIN: 14-3-3 eta Protein: <0.2 ng/mL (ref <0.2)

## 2019-05-30 MED ORDER — LOSARTAN POTASSIUM 100 MG PO TABS: 100.0000 mg | ORAL_TABLET | Freq: Every day | ORAL | 1 refills | Status: DC

## 2019-05-30 MED ORDER — METOPROLOL TARTRATE 50 MG PO TABS: 50.0000 mg | ORAL_TABLET | Freq: Two times a day (BID) | ORAL | 1 refills | Status: DC

## 2019-05-30 NOTE — Progress Notes (Signed)
Chief Complaint  Patient presents with  . Hypertension    HPI:  [name] is a 73 y.o. female, who is here today c/o elevated BP since this past Sunday.  She was in the ED on 05/28/19 and her BP was 157/84, at that time she had headache,which has resolved.Losartan dose was increased from 25 mgh to 50 mg.  Home BP's: 160-180/100. Saturday she was "not feeling good", was feeling "jettery" and "nervous."  She has taken her medications as prescribed. She is on Losartan 50 mg and Metoprolol tartrate 25 mg bid.  Denies severe/frequent headache, visual changes, chest pain, dyspnea, palpitation, claudication, focal weakness, or edema.  CAD, s/p stent placement. She is on Plavix 75 mg daily and has been 81 mg daily. She has not noted nose/gum bleeding,blood in the stool, melena, or gross hematuria.  CKD III, she has not noted decreased urine output or foam in urine. She is not taking OTC NSAID's.   Lab Results  Component Value Date   CREATININE 1.09 03/26/2019   BUN 18 03/26/2019   NA 140 03/26/2019   K 4.4 03/26/2019   CL 105 03/26/2019   CO2 26 03/26/2019   Right eye conjunctival redness noted 2 days ago. Denies eye pain,photophobia,or diplopia.   Review of Systems  Constitutional: Positive for fatigue. Negative for activity change, appetite change and fever.  HENT: Negative for mouth sores and sore throat.   Eyes: Negative for discharge and itching.  Respiratory: Negative for cough and wheezing.   Gastrointestinal: Negative for abdominal pain, nausea and vomiting.       Negative for changes in bowel habits.  Genitourinary: Negative for difficulty urinating and dysuria.  Skin: Negative for rash and wound.  Allergic/Immunologic: Positive for environmental allergies.  Neurological: Negative for dizziness, syncope, facial asymmetry and speech difficulty.  Hematological: Does not bruise/bleed easily.  Psychiatric/Behavioral: Negative for confusion. The patient  is nervous/anxious.   Rest see pertinent positives and negatives per HPI.   Current Outpatient Medications on File Prior to Visit  Medication Sig Dispense Refill  . acetaminophen (TYLENOL) 500 MG tablet Take 1,000 mg by mouth every 6 (six) hours as needed for moderate pain or headache.     [name] Kitchen aspirin 81 MG chewable tablet Chew 81 mg by mouth daily.    . clopidogrel (PLAVIX) 75 MG tablet TAKE ONE TABLET BY MOUTH ONE TIME DAILY  90 tablet 3  . diclofenac sodium (VOLTAREN) 1 % GEL Apply 2 g topically 3 (three) times daily as needed (for arm pain).    . DULoxetine (CYMBALTA) 30 MG capsule Take 1 capsule (30 mg total) by mouth daily. 30 capsule 1  . famotidine (PEPCID) 20 MG tablet TAKE 1 TABLET BY MOUTH TWICE DAILY AS NEEDED FOR HEARTBURN OR INDIGESTION 60 tablet 1  . fluticasone (FLONASE) 50 MCG/ACT nasal spray Place 2 sprays into both nostrils at bedtime as needed for allergies. 32 g 2  . gabapentin (NEURONTIN) 100 MG capsule Take 1 capsule (100 mg total) by mouth 2 (two) times daily. 30 capsule 5  . gabapentin (NEURONTIN) 400 MG capsule Take 1 capsule (400 mg total) by mouth 2 (two) times daily. 180 capsule 4  . ipratropium (ATROVENT) 0.06 % nasal spray PLACE 2 SPRAYS INTO BOTH NOSTRILS 4 (FOUR) TIMES DAILY. 15 mL 2  . Multiple Vitamin (MULTIVITAMIN) tablet Take 1 tablet by mouth daily.    . nicotine (NICODERM CQ - DOSED IN MG/24 HOURS) 14 mg/24hr patch Place 1  patch (14 mg total) onto the skin daily. 28 patch 1  . nitroGLYCERIN (NITROSTAT) 0.4 MG SL tablet Place 1 tablet (0.4 mg total) under the tongue every 5 (five) minutes as needed for chest pain. 30 tablet 6  . polyethylene glycol powder (GLYCOLAX/MIRALAX) powder Take 17 g by mouth daily. (Patient taking differently: Take 17 g by mouth every other day. ) 500 g 3  . rosuvastatin (CRESTOR) 40 MG tablet Take 1 tablet (40 mg total) by mouth daily. 90 tablet 3  . Vitamin D, Ergocalciferol, (DRISDOL) 1.25 MG (50000 UT) CAPS capsule 1 cap weekly x 8  weeks then every 2 weeks. 12 capsule 3   No current facility-administered medications on file prior to visit.      Past Medical History:  Diagnosis Date  . Allergy   . Anxiety   . Arthritis    RA (Dr. Ouida Blackwell) Bilateral hands  . C. difficile diarrhea    history of  . CAD (coronary artery disease), native coronary artery    a. Cardiac CT with + FFR for Ramus b. cath 05/2017 100% sub acute lesion to mid circumflex artery s/p DES.  . Cancer ([name])    Right kidney CA removed.   . CKD (chronic kidney disease) stage 3, GFR 30-59 ml/min 10/11/2016   s/p R nephrectomy  . Colon polyps 2008   HYPERPLASTIC  . Costochondritis    a. Nuc Stress Test 6/16: EF 70, no scar or ischemia, low risk // b. Echo 3/16: mild conc LVH, EF 65-70, no RWMA, Gr 1 DD, mild TR  . Diabetes mellitus without complication ([place])    type 2  no meds  . Dyspnea   . Gastritis   . GERD (gastroesophageal reflux disease)   . History of echocardiogram    Echo 3/18:  Moderate LVH, EF [id number], grade 1 diastolic dysfunction, calcified aortic valve, mild MR, moderate LAE  . History of nuclear stress test    Myoview 3/18: Mod size and intensity fixed septal defect, may be artifact.Opposite mod size and intensity lat defect, which is reversible and could represent ischemia or possibly artifact (SDS 4). LVEF 71% with normal wall motion. Intermediate risk study. >> images reviewed with Dr. Dorris Carnes - no sig ischemia; med rx   . History of pneumonia   . Hx of cardiovascular stress test    Lexiscan Myoview 6/16:  EF 70%, no scar or ischemia; Low Risk  . Hypertension   . Neuromuscular disorder ([place])    Neruopathy in bilateral feet  . Neuropathy   . Orthostatic hypotension 05/28/2017  . Osteoarthritis   . Pneumonia   . S/P angioplasty with stent 05/27/17 to LCX with DES  05/28/2017  . Thyroid disease    Allergies  Allergen Reactions  . Oxycodone-Acetaminophen Hives and Itching    hallucinations  . Percocet  [Oxycodone-Acetaminophen] Hives, Itching and Other (See Comments)    hallucinations  . Pravastatin Sodium Other (See Comments)    Muscle aches Muscle aches  . Hydrocodone Other (See Comments)    "crazy dreams"  . Aspirin Other (See Comments)    GI upset Other reaction(s): Other (See Comments) REACTION: GI upset    Social History   Socioeconomic History  . Marital status: Divorced    Spouse name: Not on file  . Number of children: 3  . Years of education: 71  . Highest education level: Not on file  Occupational History  . Occupation: Retired    Fish farm manager: RETIRED  Social Needs  .  Financial resource strain: Not on file  . Food insecurity    Worry: Not on file    Inability: Not on file  . Transportation needs    Medical: Not on file    Non-medical: Not on file  Tobacco Use  . Smoking status: Current Every Day Smoker    Packs/day: 0.20    Years: 15.00    Pack years: 3.00    Types: Cigarettes  . Smokeless tobacco: Never Used  . Tobacco comment: 03/16/17  1 pk/week  Substance and Sexual Activity  . Alcohol use: No    Alcohol/week: 0.0 standard drinks  . Drug use: No  . Sexual activity: Not on file  Lifestyle  . Physical activity    Days per week: Not on file    Minutes per session: Not on file  . Stress: Not on file  Relationships  . Social Herbalist on phone: Not on file    Gets together: Not on file    Attends religious service: Not on file    Active member of club or organization: Not on file    Attends meetings of clubs or organizations: Not on file    Relationship status: Not on file  Other Topics Concern  . Not on file  Social History Narrative   Single, dgtr lives with her   Never Smoked    Alcohol use- no   Drug use-no   Regular Exercise-yes   Former Smoker- 12/2008    Vitals:   05/30/19 1611 05/30/19 1653  BP: (!) 200/110 (!) 200/100  Pulse: 69   Resp: 16   Temp: 97.6 F (36.4 C)   SpO2: 96%    Body mass index is 36.15  kg/m.   Physical Exam  Nursing note and vitals reviewed. Constitutional: She is oriented to person, place, and time. She appears well-developed. No distress.  HENT:  Head: Normocephalic and atraumatic.  Mouth/Throat: Oropharynx is clear and moist and mucous membranes are normal.  Eyes: Pupils are equal, round, and reactive to light. EOM are normal. Right conjunctiva has a hemorrhage.    Cardiovascular: Normal rate and regular rhythm.  No murmur heard. Pulses:      Dorsalis pedis pulses are 2+ on the right side and 2+ on the left side.  Respiratory: Effort normal and breath sounds normal. No respiratory distress.  GI: Soft. She exhibits no mass. There is no hepatomegaly. There is no abdominal tenderness.  Musculoskeletal:        General: No edema.  Lymphadenopathy:    She has no cervical adenopathy.  Neurological: She is alert and oriented to person, place, and time. She has normal strength. No cranial nerve deficit. Gait normal.  Skin: Skin is warm. No rash noted. No erythema.  Psychiatric: She has a normal mood and affect.  Well groomed, good eye contact.    ASSESSMENT AND PLAN:  Ms. Reminisce was seen today for hypertension.  Diagnoses and all orders for this visit:  Subconjunctival hemorrhage of right eye Small. Continue monitoring. Instructed about warning signs.  Hypertension with heart disease Still poorly controlled,asymptomstic. We discussed possible complications of elevated. Losartan increased from 50 mg to 100 mg daily. Metoprolol tartrate increased from 25 mg bid to 50 mg bid. Educated about side effects of meds, she needs to monitor BP and HR closely.  -     metoprolol tartrate (LOPRESSOR) 50 MG tablet; Take 1 tablet (50 mg total) by mouth 2 (two) times daily. -  losartan (COZAAR) 100 MG tablet; Take 1 tablet (100 mg total) by mouth daily.  Stage 3a chronic kidney disease Continue Losartan. Will plan on BMP next OV. Low salt diet and adequate  hydration.  Coronary artery disease involving native coronary artery of native heart with unstable angina pectoris ([place]) We discussed side effects of Plavix and aspirin. BP lower at home, so for now no changes. If BP persistently 200/100, she was instructed to hold on Aspirin. If subconjunctival hemorrhage gets worse or she develops headache again with BP > 180/110, we may need to consider holding on antiplatelet therapy.  Continue Metoprolol and Crestor.   Return in about 2 weeks (around 06/13/2019) for HTN.    Elyan Vanwieren G. [name], MD  [facility]. [place] office.

## 2019-05-30 NOTE — Patient Instructions (Addendum)
A few things to remember from today's visit:   Subconjunctival hemorrhage of right eye  Hypertension with heart disease - Plan: metoprolol tartrate (LOPRESSOR) 50 MG tablet, losartan (COZAAR) 100 MG tablet  Today we increased dose of metoprolol and losartan. Continue monitoring blood pressure and pulse. If severe headache, visual changes, chest pain, palpitations, focal weakness, or confusion among some you need to seek immediate medical attention, call 911.  Smoking cessation is strongly recommended.  Please be sure medication list is accurate. If a new problem present, please set up appointment sooner than planned today.

## 2019-06-10 NOTE — Progress Notes (Signed)
Office Visit Note  Patient: [name]             Date of Birth: [birth year]           MRN: [id number]             PCP: [name], [name], MD Referring: [name], [name], MD Visit Date: 06/19/2019 Occupation: @GUAROCC @  Subjective:  Pain in both hands.   History of Present Illness: [name] is a 73 y.o. female with history of inflammatory arthritis, pseudogout and osteoarthritis.  She states she continues to have pain and discomfort in her joints.  She states she has pain and discomfort especially in her bilateral hands and some intermittent swelling.  She could not take colchicine in the past due to diarrhea.  She has failed most of the DMARDs.  Biologics were not tried by her previous physicians because of the history of renal cancer.  She had a second opinion at [facility] where Dr. Graylin Shiver also agreed that her symptoms were consistent with pseudogout and osteoarthritis instead of rheumatoid arthritis.  Her arthritis response to prednisone.  Activities of Daily Living:  Patient reports morning stiffness for 30 minutes.   Patient Denies nocturnal pain.  Difficulty dressing/grooming: Denies Difficulty climbing stairs: Reports Difficulty getting out of chair: Reports Difficulty using hands for taps, buttons, cutlery, and/or writing: Reports  Review of Systems  Constitutional: Positive for fatigue. Negative for night sweats, weight gain and weight loss.  HENT: Negative for mouth sores, trouble swallowing, trouble swallowing, mouth dryness and nose dryness.   Eyes: Negative for pain, redness, itching, visual disturbance and dryness.  Respiratory: Negative for cough, shortness of breath, wheezing and difficulty breathing.   Cardiovascular: Negative for chest pain, palpitations, hypertension, irregular heartbeat and swelling in legs/feet.  Gastrointestinal: Negative for blood in stool, constipation and diarrhea.  Endocrine: Negative for increased urination.  Genitourinary:  Negative for difficulty urinating and vaginal dryness.  Musculoskeletal: Positive for arthralgias, joint pain, joint swelling and morning stiffness. Negative for myalgias, muscle weakness, muscle tenderness and myalgias.  Skin: Negative for color change, rash, hair loss, skin tightness, ulcers and sensitivity to sunlight.  Allergic/Immunologic: Negative for susceptible to infections.  Neurological: Positive for numbness. Negative for dizziness, light-headedness, headaches, memory loss, night sweats and weakness.  Hematological: Positive for bruising/bleeding tendency. Negative for swollen glands.  Psychiatric/Behavioral: Negative for depressed mood, confusion and sleep disturbance. The patient is not nervous/anxious.     PMFS History:  Patient Active Problem List   Diagnosis Date Noted   Anxiety disorder, unspecified 03/26/2019   Tobacco use disorder 09/29/2018   Cervical myelopathy ([place]) 05/29/2018   Cervicalgia 09/16/2017   Orthostatic hypotension 05/28/2017   S/P angioplasty with stent 05/27/17 to LCX with DES  05/28/2017   CAD (coronary artery disease)    Polyneuropathy 05/25/2017   Chest pain with moderate risk for cardiac etiology 05/25/2017   Class 1 obesity with serious comorbidity and body mass index (BMI) of 34.0 to 34.9 in adult 12/06/2016   CKD (chronic kidney disease) stage 3, GFR 30-59 ml/min 10/11/2016   Hot flashes, menopausal 10/01/2016   Chest pain with moderate risk of acute coronary syndrome 10/25/2014   Degenerative spondylolisthesis 10/15/2014   Spinal stenosis, lumbar region, with neurogenic claudication 02/12/2013   DM (diabetes mellitus), type 2 with renal complications ([place]) [id number]   History of Rtr nephrectomy 2010, secondary to renal cell cancer 01/14/2009   PERSONAL HX COLONIC POLYPS 11/14/2008   Hyperlipidemia associated with type  2 diabetes mellitus ([place]) 11/16/2007   ESOPHAGEAL STRICTURE 10/12/2007   Rheumatoid arthritis ([place])  02/27/2007   Essential hypertension 01/19/2007   Allergic rhinitis 01/19/2007   GERD 01/19/2007    Past Medical History:  Diagnosis Date   Allergy    Anxiety    Arthritis    RA (Dr. Ouida Sills) Bilateral hands   C. difficile diarrhea    history of   CAD (coronary artery disease), native coronary artery    a. Cardiac CT with + FFR for Ramus b. cath 05/2017 100% sub acute lesion to mid circumflex artery s/p DES.   Cancer [facility])    Right kidney CA removed.    CKD (chronic kidney disease) stage 3, GFR 30-59 ml/min 10/11/2016   s/p R nephrectomy   Colon polyps 2008   HYPERPLASTIC   Costochondritis    a. Nuc Stress Test 6/16: EF 70, no scar or ischemia, low risk // b. Echo 3/16: mild conc LVH, EF 65-70, no RWMA, Gr 1 DD, mild TR   Diabetes mellitus without complication (HCC)    type 2  no meds   Dyspnea    Gastritis    GERD (gastroesophageal reflux disease)    History of echocardiogram    Echo 3/18:  Moderate LVH, EF [id number], grade 1 diastolic dysfunction, calcified aortic valve, mild MR, moderate LAE   History of nuclear stress test    Myoview 3/18: Mod size and intensity fixed septal defect, may be artifact.Opposite mod size and intensity lat defect, which is reversible and could represent ischemia or possibly artifact (SDS 4). LVEF 71% with normal wall motion. Intermediate risk study. >> images reviewed with Dr. Dorris Carnes - no sig ischemia; med rx    History of pneumonia    Hx of cardiovascular stress test    Lexiscan Myoview 6/16:  EF 70%, no scar or ischemia; Low Risk   Hypertension    Neuromuscular disorder (HCC)    Neruopathy in bilateral feet   Neuropathy    Orthostatic hypotension 05/28/2017   Osteoarthritis    Pneumonia    S/P angioplasty with stent 05/27/17 to LCX with DES  05/28/2017   Thyroid disease     Family History  Problem Relation Age of Onset   Rheum arthritis Mother    Stroke Mother    Prostate cancer Father    Heart disease  Father    Diabetes Brother    Dementia Brother    Parkinsonism Brother    Kidney disease Son    Diabetes Brother    Cancer Brother    Dementia Brother    Colon cancer Neg Hx    Esophageal cancer Neg Hx    Pancreatic cancer Neg Hx    Liver disease Neg Hx    Past Surgical History:  Procedure Laterality Date   ABDOMINAL HYSTERECTOMY  1978   ANTERIOR CERVICAL DECOMP/DISCECTOMY FUSION N/A 05/29/2018   Procedure: ANTERIOR CERVICAL DECOMPRESSION FUSION - CERVICAL FIVE-CERVICAL SIX - CERVICAL SIX-CERVICAL SEVEN;  Surgeon: Earnie Larsson, MD;  Location: [facility];  Service: Neurosurgery;  Laterality: N/A;   BACK SURGERY     x 2   CARPAL TUNNEL RELEASE Left    COLONOSCOPY W/ BIOPSIES AND POLYPECTOMY     Hx: of   CORONARY STENT INTERVENTION N/A 05/27/2017   Procedure: CORONARY STENT INTERVENTION;  Surgeon: Burnell Blanks, MD;  Location: [facility];  Service: Cardiovascular;  Laterality: N/A;   ESOPHAGOGASTRODUODENOSCOPY     HNP     LEFT HEART CATH  AND CORONARY ANGIOGRAPHY N/A 05/27/2017   Procedure: LEFT HEART CATH AND CORONARY ANGIOGRAPHY;  Surgeon: Burnell Blanks, MD;  Location: [place] [facility];  Service: Cardiovascular;  Laterality: N/A;   LUMBAR LAMINECTOMY/DECOMPRESSION MICRODISCECTOMY Left 02/12/2013   Procedure: LUMBAR TWO THREE, LUMBAR THREE FOUR, LUMBAR FOUR FIVE  LAMINECTOMY/DECOMPRESSION MICRODISCECTOMY 3 LEVELS;  Surgeon: Charlie Pitter, MD;  Location: [facility];  Service: Neurosurgery;  Laterality: Left;   NEPHRECTOMY Right 2010   10.rcc cancer   TOTAL KNEE ARTHROPLASTY Right    Redo   Social History   Social History Narrative   Single, dgtr lives with her   Never Smoked    Alcohol use- no   Drug use-no   Regular Exercise-yes   Former Smoker- 12/2008   Immunization History  Administered Date(s) Administered   Fluad Quad(high Dose 65+) 05/05/2019   Influenza Split 05/23/2012, 06/07/2013   Influenza Whole 08/02/2005,  05/08/2007, 05/09/2008, 08/19/2009, 06/03/2010   Influenza, High Dose Seasonal PF 06/20/2014, 05/13/2015, 06/08/2016, 05/23/2017, 05/18/2018   Pneumococcal Conjugate-13 02/27/2007   Pneumococcal Polysaccharide-23 02/27/2007, 12/16/2011, 10/16/2014, 05/26/2017   Td 08/02/2001   Tdap 12/16/2011   Zoster 12/27/2012     Objective: Vital Signs: BP (!) 169/86 (BP Location: Left Arm, Patient Position: Sitting, Cuff Size: Large)    Pulse 62    Resp 15    Ht 5\' 6"  (1.676 m)    Wt 225 lb (102.1 kg)    BMI 36.32 kg/m    Physical Exam Vitals signs and nursing note reviewed.  Constitutional:      Appearance: She is well-developed.  HENT:     Head: Normocephalic and atraumatic.  Eyes:     Conjunctiva/sclera: Conjunctivae normal.  Neck:     Musculoskeletal: Normal range of motion.  Cardiovascular:     Rate and Rhythm: Normal rate and regular rhythm.     Heart sounds: Normal heart sounds.  Pulmonary:     Effort: Pulmonary effort is normal.     Breath sounds: Normal breath sounds.  Abdominal:     General: Bowel sounds are normal.     Palpations: Abdomen is soft.  Lymphadenopathy:     Cervical: No cervical adenopathy.  Skin:    General: Skin is warm and dry.     Capillary Refill: Capillary refill takes less than 2 seconds.  Neurological:     Mental Status: She is alert and oriented to person, place, and time.  Psychiatric:        Behavior: Behavior normal.      Musculoskeletal Exam: C-spine was in good range of motion.  Shoulder joints, elbow joints, wrist joints with good range of motion.  She has some synovitis in her left wrist joint.  MCPs PIPs and DIPs with good range of motion.  She has some DIP and PIP thickening consistent with osteoarthritis.  Hip joints, knee joints, ankle joints with good range of motion with no synovitis.  CDAI Exam: CDAI Score: -- Patient Global: --; Provider Global: -- Swollen: --; Tender: -- Joint Exam   No joint exam has been documented for  this visit   There is currently no information documented on the homunculus. Go to the Rheumatology activity and complete the homunculus joint exam.  Investigation: No additional findings.  Imaging: Xr Foot 2 Views Left  Result Date: 05/24/2019 PIP and DIP narrowing was noted.  No MTP joint narrowing or erosive changes were noted.  No intertarsal joint space narrowing was noted.  No subtalar joint space narrowing was  noted.  Inferior calcaneal spur was noted. Impression: These findings are consistent with osteoarthritis of the foot.  Xr Foot 2 Views Right  Result Date: 05/24/2019 Mild PIP and DIP narrowing was noted.  No MTP joint changes were noted.  No erosive changes were noted.  No intertarsal or tibiotalar joint space narrowing was noted.  Inferior calcaneal spur was noted. Impression: These findings are consistent with osteoarthritis of the foot.  Xr Hand 2 View Left  Result Date: 05/24/2019 Severe CMC narrowing was noted.  PIP and DIP narrowing was noted.  No MCP narrowing was noted.  Intercarpal and radiocarpal joint space narrowing with cystic changes were noted.  Chondrocalcinosis was noted in the triangular cartilage.  These findings can be seen in rheumatoid arthritis and chondrocalcinosis.  Although absence of juxta-articular osteopenia and MCP involvement makes it more likely to be chondrocalcinosis. Impression: These findings are consistent with osteoarthritis and chondrocalcinosis.  Xr Hand 2 View Right  Result Date: 05/24/2019 CMC PIP and DIP narrowing was noted.  No MCP changes were noted.  No intercarpal radiocarpal joint space narrowing was noted.  No erosive changes were noted.  Some calcification was noted in the triangular cartilage. Impression: These findings are consistent with osteoarthritis and chondrocalcinosis.   Recent Labs: Lab Results  Component Value Date   WBC 7.0 07/21/2018   HGB 13.6 07/21/2018   PLT 246 07/21/2018   NA 140 03/26/2019   K 4.4  03/26/2019   CL 105 03/26/2019   CO2 26 03/26/2019   GLUCOSE 85 03/26/2019   BUN 18 03/26/2019   CREATININE 1.09 03/26/2019   BILITOT 0.4 02/27/2019   ALKPHOS 101 02/27/2019   AST 21 02/27/2019   ALT 11 02/27/2019   PROT 7.3 02/27/2019   ALBUMIN 4.4 02/27/2019   CALCIUM 9.4 03/26/2019   GFRAA 46 (L) 09/14/2018  May 24, 2019 iron studies normal, magnesium 2.0, uric acid 6.2, RF 17, anti-CCP negative, 14 3 3  eta negative  Speciality Comments: No specialty comments available.  Procedures:  No procedures performed Allergies: Oxycodone-acetaminophen, Percocet [oxycodone-acetaminophen], Pravastatin sodium, Hydrocodone, and Aspirin   Assessment / Plan:     Visit Diagnoses: Inflammatory arthritis - Positive RF, positive chondrocalcinosis.  Treated by Dr. Ouida Sills and Dr. Kathlene November with several DMARDs and an adequate response.  She was also evaluated  Dr. Graylin Shiver at [facility] who felt that her symptoms were consistent with CPPD and osteoarthritis.  I am in agreement with Dr. Graylin Shiver.  She has left wrist joint inflammation and the x-ray showed CPPD crystals.  She has discomfort in her right [facility] joint which appears to be due to osteoarthritis.  I reviewed records from Dr. Teodoro Spray office and also from [facility] today.  Medication management - Inadequate response to PLQ, MTX and SSZ.  Mild improvement with Arava per patient.  Colchicine caused diarrhea in the past.  Chondrocalcinosis-she had chondrocalcinosis in the x-ray of her wrist joints.  Detailed counseling was provided.  I discussed with patient to try colchicine half a tablet a day if tolerated.  She was in agreement.  We will send a prescription for colchicine 0.6 mg half tablet p.o. daily.  We will see the response over the next few months.  If she could not tolerate colchicine then she may try topical diclofenac gel which she has been using.  Also intermittently cortisone injection can be given.  Primary  osteoarthritis of both hands-She has bilateral CMC discomfort.  Her right CMC joint is especially painful.  I  have given her a prescription for right CMC brace.  Status post total knee replacement, right-doing well  Primary osteoarthritis of both feet-mild discomfort  DDD (degenerative disc disease), cervical -  status post laminectomy by Dr. Trenton Gammon  DDD (degenerative disc disease), lumbar - Surgery x3 per patient.  She has chronic pain.  Polyneuropathy-she is on gabapentin and Cymbalta which helps to some extent.  Type 2 diabetes mellitus with stage 3 chronic kidney disease, without long-term current use of insulin, unspecified whether stage 3a or 3b CKD ([place])  Coronary artery disease involving native coronary artery of native heart without angina pectoris  S/P angioplasty with stent 05/27/17 to LCX with DES   Hyperlipidemia associated with type 2 diabetes mellitus ([place])  Essential hypertension  Orthostatic hypotension  History of gastroesophageal reflux (GERD)  Esophageal stricture  History of Rtr nephrectomy 2010, secondary to renal cell cancer  Tobacco use disorder  Orders: No orders of the defined types were placed in this encounter.  Meds ordered this encounter  Medications   colchicine 0.6 MG tablet    Sig: Take 0.5 tablets (0.3 mg total) by mouth daily.    Dispense:  45 tablet    Refill:  0    Face-to-face time spent with patient was 25 minutes. Greater than 50% of time was spent in counseling and coordination of care.  Follow-Up Instructions: Return in about 3 months (around 09/19/2019).   Bo Merino, MD  Note - This record has been created using Editor, commissioning.  Chart creation errors have been sought, but may not always  have been located. Such creation errors do not reflect on  the standard of medical care.

## 2019-06-13 ENCOUNTER — Encounter: Payer: Self-pay | Admitting: Family Medicine

## 2019-06-13 ENCOUNTER — Ambulatory Visit (INDEPENDENT_AMBULATORY_CARE_PROVIDER_SITE_OTHER): Payer: Medicare HMO | Admitting: Family Medicine

## 2019-06-13 ENCOUNTER — Other Ambulatory Visit: Payer: Self-pay

## 2019-06-13 VITALS — BP 132/80 | HR 80 | Temp 95.9°F | Resp 16 | Ht 66.0 in | Wt 222.0 lb

## 2019-06-13 DIAGNOSIS — R69 Illness, unspecified: Secondary | ICD-10-CM | POA: Diagnosis not present

## 2019-06-13 DIAGNOSIS — E6609 Other obesity due to excess calories: Secondary | ICD-10-CM

## 2019-06-13 DIAGNOSIS — F17200 Nicotine dependence, unspecified, uncomplicated: Secondary | ICD-10-CM | POA: Diagnosis not present

## 2019-06-13 DIAGNOSIS — Z6834 Body mass index (BMI) 34.0-34.9, adult: Secondary | ICD-10-CM

## 2019-06-13 DIAGNOSIS — I119 Hypertensive heart disease without heart failure: Secondary | ICD-10-CM | POA: Diagnosis not present

## 2019-06-13 DIAGNOSIS — H5710 Ocular pain, unspecified eye: Secondary | ICD-10-CM

## 2019-06-13 NOTE — Progress Notes (Signed)
HPI:   [name] is a 73 y.o. female, who is here today follow up on a recent visit.  She was last seen on 05/30/19,when BP was elevated. She is on Losartan 100 mg daily and Metoprolol Tartrate 50 mg bid.  BP has improved, 130's/70's. Once BP was 162/93, after eating bacon.  Denies chest pain, dyspnea, palpitation, focal weakness, or edema.  She is eating healthier, decreased portions and snacking.  Lab Results  Component Value Date   CREATININE 1.09 03/26/2019   BUN 18 03/26/2019   NA 140 03/26/2019   K 4.4 03/26/2019   CL 105 03/26/2019   CO2 26 03/26/2019   Today she is c/o left peri ocular "soreness" yesterday. + Eye pruritus. It is better today. No visual changes,conjunctival erythema,or drainage. Hx of Allergic rhinitis, she is on Flonase and Atrovent nasal spray as needed  Denies fever,chills,changes in smell or taste,cough,wheezing,or exertional dyspnea. + Smoker.  She has noted tried OTC eye drops.  Review of Systems  Constitutional: Negative for activity change, appetite change, fatigue and fever.  HENT: Negative for mouth sores, nosebleeds and sore throat.   Eyes: Negative for photophobia and pain.  Gastrointestinal: Negative for abdominal pain, nausea and vomiting.       Negative for changes in bowel habits.  Genitourinary: Negative for decreased urine volume and hematuria.  Musculoskeletal: Negative for gait problem and myalgias.  Skin: Negative for pallor and rash.  Allergic/Immunologic: Positive for environmental allergies.  Neurological: Negative for syncope, facial asymmetry, weakness and headaches.  Psychiatric/Behavioral: Negative for confusion. The patient is nervous/anxious.   Rest of ROS, see pertinent positives sand negatives in HPI   Current Outpatient Medications on File Prior to Visit  Medication Sig Dispense Refill  . acetaminophen (TYLENOL) 500 MG tablet Take 1,000 mg by mouth every 6 (six) hours as needed for moderate  pain or headache.     [name] Kitchen aspirin 81 MG chewable tablet Chew 81 mg by mouth daily.    . clopidogrel (PLAVIX) 75 MG tablet TAKE ONE TABLET BY MOUTH ONE TIME DAILY  90 tablet 3  . diclofenac sodium (VOLTAREN) 1 % GEL Apply 2 g topically 3 (three) times daily as needed (for arm pain).    . DULoxetine (CYMBALTA) 30 MG capsule Take 1 capsule (30 mg total) by mouth daily. 30 capsule 1  . famotidine (PEPCID) 20 MG tablet TAKE 1 TABLET BY MOUTH TWICE DAILY AS NEEDED FOR HEARTBURN OR INDIGESTION 60 tablet 1  . fluticasone (FLONASE) 50 MCG/ACT nasal spray Place 2 sprays into both nostrils at bedtime as needed for allergies. 32 g 2  . gabapentin (NEURONTIN) 100 MG capsule Take 1 capsule (100 mg total) by mouth 2 (two) times daily. 30 capsule 5  . gabapentin (NEURONTIN) 400 MG capsule Take 1 capsule (400 mg total) by mouth 2 (two) times daily. 180 capsule 4  . ipratropium (ATROVENT) 0.06 % nasal spray PLACE 2 SPRAYS INTO BOTH NOSTRILS 4 (FOUR) TIMES DAILY. 15 mL 2  . losartan (COZAAR) 100 MG tablet Take 1 tablet (100 mg total) by mouth daily. 90 tablet 1  . metoprolol tartrate (LOPRESSOR) 50 MG tablet Take 1 tablet (50 mg total) by mouth 2 (two) times daily. 180 tablet 1  . Multiple Vitamin (MULTIVITAMIN) tablet Take 1 tablet by mouth daily.    . nicotine (NICODERM CQ - DOSED IN MG/24 HOURS) 14 mg/24hr patch Place 1 patch (14 mg total) onto the skin daily. 28 patch 1  .  nitroGLYCERIN (NITROSTAT) 0.4 MG SL tablet Place 1 tablet (0.4 mg total) under the tongue every 5 (five) minutes as needed for chest pain. 30 tablet 6  . polyethylene glycol powder (GLYCOLAX/MIRALAX) powder Take 17 g by mouth daily. (Patient taking differently: Take 17 g by mouth every other day. ) 500 g 3  . rosuvastatin (CRESTOR) 40 MG tablet Take 1 tablet (40 mg total) by mouth daily. 90 tablet 3  . Vitamin D, Ergocalciferol, (DRISDOL) 1.25 MG (50000 UT) CAPS capsule 1 cap weekly x 8 weeks then every 2 weeks. 12 capsule 3   No current  facility-administered medications on file prior to visit.      Past Medical History:  Diagnosis Date  . Allergy   . Anxiety   . Arthritis    RA (Dr. Ouida Sills) Bilateral hands  . C. difficile diarrhea    history of  . CAD (coronary artery disease), native coronary artery    a. Cardiac CT with + FFR for Ramus b. cath 05/2017 100% sub acute lesion to mid circumflex artery s/p DES.  . Cancer ([place])    Right kidney CA removed.   . CKD (chronic kidney disease) stage 3, GFR 30-59 ml/min 10/11/2016   s/p R nephrectomy  . Colon polyps 2008   HYPERPLASTIC  . Costochondritis    a. Nuc Stress Test 6/16: EF 70, no scar or ischemia, low risk // b. Echo 3/16: mild conc LVH, EF 65-70, no RWMA, Gr 1 DD, mild TR  . Diabetes mellitus without complication ([place])    type 2  no meds  . Dyspnea   . Gastritis   . GERD (gastroesophageal reflux disease)   . History of echocardiogram    Echo 3/18:  Moderate LVH, EF [id number], grade 1 diastolic dysfunction, calcified aortic valve, mild MR, moderate LAE  . History of nuclear stress test    Myoview 3/18: Mod size and intensity fixed septal defect, may be artifact.Opposite mod size and intensity lat defect, which is reversible and could represent ischemia or possibly artifact (SDS 4). LVEF 71% with normal wall motion. Intermediate risk study. >> images reviewed with Dr. Dorris Carnes - no sig ischemia; med rx   . History of pneumonia   . Hx of cardiovascular stress test    Lexiscan Myoview 6/16:  EF 70%, no scar or ischemia; Low Risk  . Hypertension   . Neuromuscular disorder ([place])    Neruopathy in bilateral feet  . Neuropathy   . Orthostatic hypotension 05/28/2017  . Osteoarthritis   . Pneumonia   . S/P angioplasty with stent 05/27/17 to LCX with DES  05/28/2017  . Thyroid disease    Allergies  Allergen Reactions  . Oxycodone-Acetaminophen Hives and Itching    hallucinations  . Percocet [Oxycodone-Acetaminophen] Hives, Itching and Other (See Comments)     hallucinations  . Pravastatin Sodium Other (See Comments)    Muscle aches Muscle aches  . Hydrocodone Other (See Comments)    "crazy dreams"  . Aspirin Other (See Comments)    GI upset Other reaction(s): Other (See Comments) REACTION: GI upset    Social History   Socioeconomic History  . Marital status: Divorced    Spouse name: Not on file  . Number of children: 3  . Years of education: 15  . Highest education level: Not on file  Occupational History  . Occupation: Retired    Fish farm manager: RETIRED  Social Needs  . Financial resource strain: Not on file  . Food insecurity  Worry: Not on file    Inability: Not on file  . Transportation needs    Medical: Not on file    Non-medical: Not on file  Tobacco Use  . Smoking status: Current Every Day Smoker    Packs/day: 0.20    Years: 15.00    Pack years: 3.00    Types: Cigarettes  . Smokeless tobacco: Never Used  . Tobacco comment: 03/16/17  1 pk/week  Substance and Sexual Activity  . Alcohol use: No    Alcohol/week: 0.0 standard drinks  . Drug use: No  . Sexual activity: Not on file  Lifestyle  . Physical activity    Days per week: Not on file    Minutes per session: Not on file  . Stress: Not on file  Relationships  . Social Herbalist on phone: Not on file    Gets together: Not on file    Attends religious service: Not on file    Active member of club or organization: Not on file    Attends meetings of clubs or organizations: Not on file    Relationship status: Not on file  Other Topics Concern  . Not on file  Social History Narrative   Single, dgtr lives with her   Never Smoked    Alcohol use- no   Drug use-no   Regular Exercise-yes   Former Smoker- 12/2008    Vitals:   06/13/19 1212  BP: 132/80  Pulse: 80  Resp: 16  Temp: (!) 95.9 F (35.5 C)   Body mass index is 35.83 kg/m. Wt Readings from Last 3 Encounters:  06/13/19 222 lb (100.7 kg)  05/30/19 224 lb (101.6 kg)  05/24/19 224 lb  (101.6 kg)   Physical Exam  Nursing note and vitals reviewed. Constitutional: She is oriented to person, place, and time. She appears well-developed. No distress.  HENT:  Head: Normocephalic and atraumatic.  Mouth/Throat: Oropharynx is clear and moist and mucous membranes are normal.  Eyes: Pupils are equal, round, and reactive to light. Conjunctivae and EOM are normal.  Cardiovascular: Normal rate and regular rhythm.  No murmur heard. Pulses:      Dorsalis pedis pulses are 2+ on the right side and 2+ on the left side.  Respiratory: Effort normal and breath sounds normal. No respiratory distress.  GI: Soft. She exhibits no mass. There is no hepatomegaly. There is no abdominal tenderness.  Musculoskeletal:        General: No edema.  Lymphadenopathy:    She has no cervical adenopathy.  Neurological: She is alert and oriented to person, place, and time. She has normal strength. No cranial nerve deficit. Gait normal.  Skin: Skin is warm. No rash noted. No erythema.  Psychiatric: Her mood appears anxious.  Well groomed, good eye contact.    ASSESSMENT AND PLAN:   Ms. LAVONA KREUSER was seen today for follow-up.  1. Hypertension with heart disease Better controlled. No changes in current management. Low salt diet recommended. Continue monitoring BP regularly.  2. Soreness of eye Could be related to allergies. Improved. Eye exam is current, recommend arranging appt for eye exam if not resolved in a few days or if it gets worse. Instructed about warning signs.  3. Class 1 obesity due to excess calories with serious comorbidity and body mass index (BMI) of 34.0 to 34.9 in adult We discussed benefits of wt loss as well as adverse effects of obesity. Consistency with healthy diet and physical activity recommended.  Weight Watchers is a good option as well as daily brisk walking for 15-30 min as tolerated.  4.Tobacco use disorder Adverse effects of tobacco use and benefits of smoking  cessation discussed. She used Nicotine patches in the past. She is decreasing cig/day and thinks she can continue doing so.   Return for Keep next appt..   -Ms. [name] was advised to return sooner than planned today if new concerns arise.   [name]. [name], MD  [facility]. [place] office.

## 2019-06-13 NOTE — Patient Instructions (Addendum)
A few things to remember from today's visit:   Hypertension with heart disease  Soreness of eye  Continue monitoring left eye for changes. Continue monitoring blood pressure. No changes today.  Please be sure medication list is accurate. If a new problem present, please set up appointment sooner than planned today.

## 2019-06-16 ENCOUNTER — Encounter: Payer: Self-pay | Admitting: Family Medicine

## 2019-06-19 ENCOUNTER — Other Ambulatory Visit: Payer: Self-pay

## 2019-06-19 ENCOUNTER — Encounter: Payer: Self-pay | Admitting: Rheumatology

## 2019-06-19 ENCOUNTER — Ambulatory Visit: Payer: Medicare HMO | Admitting: Rheumatology

## 2019-06-19 VITALS — BP 169/86 | HR 62 | Resp 15 | Ht 66.0 in | Wt 225.0 lb

## 2019-06-19 DIAGNOSIS — I2510 Atherosclerotic heart disease of native coronary artery without angina pectoris: Secondary | ICD-10-CM | POA: Diagnosis not present

## 2019-06-19 DIAGNOSIS — M19071 Primary osteoarthritis, right ankle and foot: Secondary | ICD-10-CM | POA: Diagnosis not present

## 2019-06-19 DIAGNOSIS — M19041 Primary osteoarthritis, right hand: Secondary | ICD-10-CM

## 2019-06-19 DIAGNOSIS — E1122 Type 2 diabetes mellitus with diabetic chronic kidney disease: Secondary | ICD-10-CM

## 2019-06-19 DIAGNOSIS — M5030 Other cervical disc degeneration, unspecified cervical region: Secondary | ICD-10-CM | POA: Diagnosis not present

## 2019-06-19 DIAGNOSIS — G629 Polyneuropathy, unspecified: Secondary | ICD-10-CM | POA: Diagnosis not present

## 2019-06-19 DIAGNOSIS — M1990 Unspecified osteoarthritis, unspecified site: Secondary | ICD-10-CM | POA: Diagnosis not present

## 2019-06-19 DIAGNOSIS — Z79899 Other long term (current) drug therapy: Secondary | ICD-10-CM | POA: Diagnosis not present

## 2019-06-19 DIAGNOSIS — N183 Chronic kidney disease, stage 3 unspecified: Secondary | ICD-10-CM

## 2019-06-19 DIAGNOSIS — M19072 Primary osteoarthritis, left ankle and foot: Secondary | ICD-10-CM

## 2019-06-19 DIAGNOSIS — Z95820 Peripheral vascular angioplasty status with implants and grafts: Secondary | ICD-10-CM

## 2019-06-19 DIAGNOSIS — Z96651 Presence of right artificial knee joint: Secondary | ICD-10-CM | POA: Diagnosis not present

## 2019-06-19 DIAGNOSIS — I951 Orthostatic hypotension: Secondary | ICD-10-CM

## 2019-06-19 DIAGNOSIS — I10 Essential (primary) hypertension: Secondary | ICD-10-CM

## 2019-06-19 DIAGNOSIS — M51369 Other intervertebral disc degeneration, lumbar region without mention of lumbar back pain or lower extremity pain: Secondary | ICD-10-CM

## 2019-06-19 DIAGNOSIS — M19042 Primary osteoarthritis, left hand: Secondary | ICD-10-CM

## 2019-06-19 DIAGNOSIS — M1120 Other chondrocalcinosis, unspecified site: Secondary | ICD-10-CM | POA: Diagnosis not present

## 2019-06-19 DIAGNOSIS — K222 Esophageal obstruction: Secondary | ICD-10-CM

## 2019-06-19 DIAGNOSIS — Z8719 Personal history of other diseases of the digestive system: Secondary | ICD-10-CM

## 2019-06-19 DIAGNOSIS — E785 Hyperlipidemia, unspecified: Secondary | ICD-10-CM

## 2019-06-19 DIAGNOSIS — E1169 Type 2 diabetes mellitus with other specified complication: Secondary | ICD-10-CM

## 2019-06-19 DIAGNOSIS — F17200 Nicotine dependence, unspecified, uncomplicated: Secondary | ICD-10-CM

## 2019-06-19 DIAGNOSIS — M5136 Other intervertebral disc degeneration, lumbar region: Secondary | ICD-10-CM

## 2019-06-19 DIAGNOSIS — Z905 Acquired absence of kidney: Secondary | ICD-10-CM

## 2019-06-19 MED ORDER — COLCHICINE 0.6 MG PO TABS: 0.3000 mg | ORAL_TABLET | Freq: Every day | ORAL | 0 refills | Status: DC

## 2019-07-09 ENCOUNTER — Ambulatory Visit: Payer: Medicare HMO | Admitting: Internal Medicine

## 2019-07-09 ENCOUNTER — Other Ambulatory Visit: Payer: Self-pay

## 2019-07-09 ENCOUNTER — Telehealth: Payer: Self-pay | Admitting: Internal Medicine

## 2019-07-09 ENCOUNTER — Encounter: Payer: Self-pay | Admitting: Internal Medicine

## 2019-07-09 VITALS — BP 138/86 | HR 65 | Ht 66.0 in | Wt 223.1 lb

## 2019-07-09 DIAGNOSIS — I2510 Atherosclerotic heart disease of native coronary artery without angina pectoris: Secondary | ICD-10-CM

## 2019-07-09 DIAGNOSIS — I10 Essential (primary) hypertension: Secondary | ICD-10-CM

## 2019-07-09 MED ORDER — TRIAMTERENE-HCTZ 37.5-25 MG PO TABS: 0.5000 | ORAL_TABLET | Freq: Every day | ORAL | 3 refills | Status: DC

## 2019-07-09 MED ORDER — NITROGLYCERIN 0.4 MG SL SUBL: 0.4000 mg | SUBLINGUAL_TABLET | SUBLINGUAL | 6 refills | Status: DC | PRN

## 2019-07-09 NOTE — Progress Notes (Signed)
Cardiology Office Note   Date:  07/09/2019   ID:  Lloyd, Cullinan 27-Dec-1945, MRN [id number]  PCP:  [name], [name], MD  Cardiologist:   Dorris Carnes, MD   Patient presents for f/u of CAD and HTN      History of Present Illness: [name] is a 73 y.o. female with a history of CAD (status post DES to mid left circumflex in 2018, mild to moderate disease elsewhere.  Patient also has a history of hypertension, hyperlipidemia, diabetes, chronic kidney disease (status post right nephrectomy), rheumatoid arthritis, OSA.  The patient was last seen by Richardson Dopp back in August of this year.  The pt denies CP   Does have L shoulder pain   Pain is worse with motiion of arm.   She says she was seen by primary provider   Losartan was increased  BP still in 160s at times. The patient has not yet using CPAP.  Has not completed evaluation.  Current Meds  Medication Sig  . acetaminophen (TYLENOL) 500 MG tablet Take 1,000 mg by mouth every 6 (six) hours as needed for moderate pain or headache.   [name] Kitchen aspirin 81 MG chewable tablet Chew 81 mg by mouth daily.  . clopidogrel (PLAVIX) 75 MG tablet TAKE ONE TABLET BY MOUTH ONE TIME DAILY   . colchicine 0.6 MG tablet Take 0.5 tablets (0.3 mg total) by mouth daily.  . diclofenac sodium (VOLTAREN) 1 % GEL Apply 2 g topically 3 (three) times daily as needed (for arm pain).  . DULoxetine (CYMBALTA) 30 MG capsule Take 1 capsule (30 mg total) by mouth daily.  . famotidine (PEPCID) 20 MG tablet TAKE 1 TABLET BY MOUTH TWICE DAILY AS NEEDED FOR HEARTBURN OR INDIGESTION  . fluticasone (FLONASE) 50 MCG/ACT nasal spray Place 2 sprays into both nostrils at bedtime as needed for allergies.  [name] Kitchen gabapentin (NEURONTIN) 100 MG capsule Take 1 capsule (100 mg total) by mouth 2 (two) times daily.  [name] Kitchen gabapentin (NEURONTIN) 400 MG capsule Take 1 capsule (400 mg total) by mouth 2 (two) times daily.  [name] Kitchen ipratropium (ATROVENT) 0.06 % nasal spray PLACE 2 SPRAYS INTO BOTH NOSTRILS 4  (FOUR) TIMES DAILY.  [name] Kitchen losartan (COZAAR) 100 MG tablet Take 1 tablet (100 mg total) by mouth daily.  . metoprolol tartrate (LOPRESSOR) 50 MG tablet Take 1 tablet (50 mg total) by mouth 2 (two) times daily.  . Multiple Vitamin (MULTIVITAMIN) tablet Take 1 tablet by mouth daily.  . nitroGLYCERIN (NITROSTAT) 0.4 MG SL tablet Place 1 tablet (0.4 mg total) under the tongue every 5 (five) minutes as needed for chest pain.  . polyethylene glycol powder (GLYCOLAX/MIRALAX) powder Take 17 g by mouth daily. (Patient taking differently: Take 17 g by mouth every other day. )  . rosuvastatin (CRESTOR) 40 MG tablet Take 1 tablet (40 mg total) by mouth daily.  . Vitamin D, Ergocalciferol, (DRISDOL) 1.25 MG (50000 UT) CAPS capsule 1 cap weekly x 8 weeks then every 2 weeks.  . [DISCONTINUED] nitroGLYCERIN (NITROSTAT) 0.4 MG SL tablet Place 1 tablet (0.4 mg total) under the tongue every 5 (five) minutes as needed for chest pain.     Allergies:   Oxycodone-acetaminophen, Percocet [oxycodone-acetaminophen], Pravastatin sodium, Hydrocodone, and Aspirin   Past Medical History:  Diagnosis Date  . Allergy   . Anxiety   . Arthritis    RA (Dr. Ouida Sills) Bilateral hands  . C. difficile diarrhea    history of  . CAD (coronary artery disease),  native coronary artery    a. Cardiac CT with + FFR for Ramus b. cath 05/2017 100% sub acute lesion to mid circumflex artery s/p DES.  . Cancer ([place])    Right kidney CA removed.   . CKD (chronic kidney disease) stage 3, GFR 30-59 ml/min 10/11/2016   s/p R nephrectomy  . Colon polyps 2008   HYPERPLASTIC  . Costochondritis    a. Nuc Stress Test 6/16: EF 70, no scar or ischemia, low risk // b. Echo 3/16: mild conc LVH, EF 65-70, no RWMA, Gr 1 DD, mild TR  . Diabetes mellitus without complication ([place])    type 2  no meds  . Dyspnea   . Gastritis   . GERD (gastroesophageal reflux disease)   . History of echocardiogram    Echo 3/18:  Moderate LVH, EF 79-89, grade 1 diastolic  dysfunction, calcified aortic valve, mild MR, moderate LAE  . History of nuclear stress test    Myoview 3/18: Mod size and intensity fixed septal defect, may be artifact.Opposite mod size and intensity lat defect, which is reversible and could represent ischemia or possibly artifact (SDS 4). LVEF 71% with normal wall motion. Intermediate risk study. >> images reviewed with Dr. Dorris Carnes - no sig ischemia; med rx   . History of pneumonia   . Hx of cardiovascular stress test    Lexiscan Myoview 6/16:  EF 70%, no scar or ischemia; Low Risk  . Hypertension   . Neuromuscular disorder ([place])    Neruopathy in bilateral feet  . Neuropathy   . Orthostatic hypotension 05/28/2017  . Osteoarthritis   . Pneumonia   . S/P angioplasty with stent 05/27/17 to LCX with DES  05/28/2017  . Thyroid disease     Past Surgical History:  Procedure Laterality Date  . ABDOMINAL HYSTERECTOMY  1978  . ANTERIOR CERVICAL DECOMP/DISCECTOMY FUSION N/A 05/29/2018   Procedure: ANTERIOR CERVICAL DECOMPRESSION FUSION - CERVICAL FIVE-CERVICAL SIX - CERVICAL SIX-CERVICAL SEVEN;  Surgeon: Earnie Larsson, MD;  Location: [facility];  Service: Neurosurgery;  Laterality: N/A;  . BACK SURGERY     x 2  . CARPAL TUNNEL RELEASE Left   . COLONOSCOPY W/ BIOPSIES AND POLYPECTOMY     Hx: of  . CORONARY STENT INTERVENTION N/A 05/27/2017   Procedure: CORONARY STENT INTERVENTION;  Surgeon: Burnell Blanks, MD;  Location: [facility];  Service: Cardiovascular;  Laterality: N/A;  . ESOPHAGOGASTRODUODENOSCOPY    . HNP    . LEFT HEART CATH AND CORONARY ANGIOGRAPHY N/A 05/27/2017   Procedure: LEFT HEART CATH AND CORONARY ANGIOGRAPHY;  Surgeon: Burnell Blanks, MD;  Location: [facility];  Service: Cardiovascular;  Laterality: N/A;  . LUMBAR LAMINECTOMY/DECOMPRESSION MICRODISCECTOMY Left 02/12/2013   Procedure: LUMBAR TWO THREE, LUMBAR THREE FOUR, LUMBAR FOUR FIVE  LAMINECTOMY/DECOMPRESSION MICRODISCECTOMY 3 LEVELS;   Surgeon: Charlie Pitter, MD;  Location: [facility];  Service: Neurosurgery;  Laterality: Left;  . NEPHRECTOMY Right 2010   10.rcc cancer  . TOTAL KNEE ARTHROPLASTY Right    Redo     Social History:  The patient  reports that she has been smoking cigarettes. She has a 3.00 pack-year smoking history. She has never used smokeless tobacco. She reports that she does not drink alcohol or use drugs.   Family History:  The patient's family history includes Cancer in her brother; Dementia in her brother and brother; Diabetes in her brother and brother; Heart disease in her father; Kidney disease in her son; Parkinsonism in  her brother; Prostate cancer in her father; Rheum arthritis in her mother; Stroke in her mother.    ROS:  Please see the history of present illness. All other systems are reviewed and  Negative to the above problem except as noted.    PHYSICAL EXAM: VS:  BP 138/86   Pulse 65   Ht _0  (1.676 m)   Wt 223 lb 1.9 oz (101.2 kg)   BMI 36.01 kg/m   PXT:GGYIRSWN obese 73yo in  in no acute distress  HEENT: normal  Neck: no JVD Cardiac: RRR; no murmurs, rubs, or gallops,no edema  Respiratory:  clear to auscultation bilaterally, normal work of breathing GI: soft, nontender, nondistended, + BS  No hepatomegaly  MS: no deformity Moving all extremities   Skin: warm and dry, no rash Neuro:  Strength and sensation are intact Psych: euthymic mood, full affect   EKG:  EKG is ordered today.  SR 65 bpm  LAFB     Lipid Panel    Component Value Date/Time   CHOL 142 02/27/2019 1023   TRIG 78 02/27/2019 1023   HDL 70 02/27/2019 1023   CHOLHDL 2.0 02/27/2019 1023   CHOLHDL 3 08/16/2018 0945   VLDL 20.4 08/16/2018 0945   LDLCALC 56 02/27/2019 1023   LDLDIRECT 171.5 12/06/2011 0812      Wt Readings from Last 3 Encounters:  07/09/19 223 lb 1.9 oz (101.2 kg)  06/19/19 225 lb (102.1 kg)  06/13/19 222 lb (100.7 kg)      ASSESSMENT AND PLAN:  1.  CAD.  Status post  intervention back in 2018.  No symptoms to suggest angina.  2.  Hypertension.  Patient reports higher blood pressures at home.  I would recommended continuing current regimen.  Note losartan had been increased to 100.  Blood pressure again 160/90 at home would add Maxide 37.5/25 (one half tab per day) check be met in 10 days.  Follow-up in clinic with hypertension clinic in 1 month.  3.  Hyperlipidemia.  Keep on Crestor.  LDL 56 in July.  4.  Sleep apnea.  Needs to get full testing completed.  This should help with blood pressure again will need to follow.  5.  Left shoulder.  Symptoms most likely musculoskeletal.  Worse with moving her arm.     1. Coronary artery disease involving native coronary artery of native heart without angina pectoris Status post DES to the mid LCx in October 2018.   No syp.  2. Essential hypertension BP could be better . Hyperlipidemia, unspecified hyperlipidemia type LDL optimal on most recent lab work.  Continue current Rx.    4. CKD (chronic kidney disease) stage 3, GFR 30-59 ml/min (HCC) Recent creatinine stable.  5. OSA (obstructive sleep apnea) She is getting her CPAP titration rescheduled.  6. Type 2 diabetes mellitus with stage 3 chronic kidney disease, without long-term current use of insulin (HCC) Hemoglobin A1c in January less than 6.    Current medicines are reviewed at length with the patient today.  The patient does not have concerns regarding medicines.  Signed, Dorris Carnes, MD  07/09/2019 2:55 PM    [address], [place], [place]  [address] Phone: [phone]; Fax: [phone]

## 2019-07-09 NOTE — Telephone Encounter (Signed)
Lm to call back ./cy 

## 2019-07-09 NOTE — Telephone Encounter (Signed)
New Patient  Patient states that she is having left shoulder pain that is radiating into her arm. Patient states that this is the same side that has the stint in it. Patient also have had headaches,. Appointment scheduled for today 07/09/19 at 2pm with Dr. Harrington Challenger.

## 2019-07-09 NOTE — Patient Instructions (Addendum)
Medication Instructions:  Your physician has recommended you make the following change in your medication:  1.) start triamterene/hctz (Maxzide) 37.5/25 mg--take HALF TABLET once a day  *If you need a refill on your cardiac medications before your next appointment, please call your pharmacy*  Lab Work: Your physician recommends that you return for lab work in: 2 weeks (BMET)  If you have labs (blood work) drawn today and your tests are completely normal, you will receive your results only by: [name] Kitchen MyChart Message (if you have MyChart) OR . A paper copy in the mail If you have any lab test that is abnormal or we need to change your treatment, we will call you to review the results.  Testing/Procedures: none  Follow-Up: At [facility], you and your health needs are our priority.  As part of our continuing mission to provide you with exceptional heart care, we have created designated Provider Care Teams.  These Care Teams include your primary Cardiologist (physician) and Advanced Practice Providers (APPs -  Physician Assistants and Nurse Practitioners) who all work together to provide you with the care you need, when you need it.  Your next appointment:   6 months  The format for your next appointment:   In Person  Provider:   You may see Dorris Carnes, MD or one of the following Advanced Practice Providers on your designated Care Team:    Richardson Dopp, PA-C  [place], [place]  Daune Perch, NP    Other Instructions Your physician recommends that you schedule a follow-up appointment in: 1 month with the blood pressure clinic pharmacist.    We will send a message to the sleep assistant to arrange CPAP titration.

## 2019-07-09 NOTE — Telephone Encounter (Signed)
Pain in front left shoulder, woke up with it yesterday.  Raising up her arm makes it worse.   Took tylenol and this helped a little bit.   Shoulder hurt through the night when she would move. Last night restless and hot.  Only notices when moving her arm.   Does not feel like pain she had when she got her stent.  No chest pain.  Does not recall injury.   Travels down left arm when she moves it.  Has neuropathy and already feels tingling in had from that.  Reports she is a little bit SOB but this is not new and attributes this to sinus problems.   Adv this sounds more like musculoskeletal pain and patient thinks it may be but wants to rule out that it is her heart and would prefer to see Dr. Harrington Challenger today.  She reports no known recent exposure to covid.

## 2019-07-17 ENCOUNTER — Telehealth: Payer: Self-pay | Admitting: *Deleted

## 2019-07-17 NOTE — Telephone Encounter (Signed)
Patient is scheduled for lab study on 08/04/19. Pt  is scheduled for COVID screening on 08/01/19 11:30 prior to titration.  Patient/Daughter understands her sleep study will be done at [facility] sleep lab. Patient understands she will receive a sleep packet in a week or so. Patient understands to call if she does not receive the sleep packet in a timely manner. Patient/Daughter agrees with treatment and thanked me for call.

## 2019-07-17 NOTE — Telephone Encounter (Signed)
-----   Message from Rodman Key, RN sent at 07/09/2019  3:12 PM EST ----- Regarding: c pap titration Hello [name], Pt was seen by Dr. Harrington Challenger today.  Has not scheduled C pap titration.  Would like to do this.  Needs to be after first of year if that is okay. You can call her to arrange. Thanks [name]! Michalene

## 2019-07-20 ENCOUNTER — Ambulatory Visit: Payer: Medicare HMO | Attending: Internal Medicine

## 2019-07-20 DIAGNOSIS — U071 COVID-19: Secondary | ICD-10-CM

## 2019-07-20 DIAGNOSIS — R238 Other skin changes: Secondary | ICD-10-CM

## 2019-07-22 LAB — NOVEL CORONAVIRUS, NAA: SARS-CoV-2, NAA: NOT DETECTED

## 2019-07-23 ENCOUNTER — Other Ambulatory Visit: Payer: Self-pay

## 2019-07-23 ENCOUNTER — Other Ambulatory Visit: Payer: Medicare HMO | Admitting: *Deleted

## 2019-07-23 ENCOUNTER — Telehealth: Payer: Self-pay

## 2019-07-23 DIAGNOSIS — I10 Essential (primary) hypertension: Secondary | ICD-10-CM | POA: Diagnosis not present

## 2019-07-23 DIAGNOSIS — I2510 Atherosclerotic heart disease of native coronary artery without angina pectoris: Secondary | ICD-10-CM | POA: Diagnosis not present

## 2019-07-23 NOTE — Telephone Encounter (Signed)
Pt notified of negative COVID-19 results. Understanding verbalized.  Chasta M Hopkins   

## 2019-07-24 ENCOUNTER — Telehealth: Payer: Self-pay | Admitting: *Deleted

## 2019-07-24 DIAGNOSIS — I10 Essential (primary) hypertension: Secondary | ICD-10-CM

## 2019-07-24 LAB — BASIC METABOLIC PANEL
BUN/Creatinine Ratio: 14 (ref 12–28)
BUN: 22 mg/dL (ref 8–27)
CO2: 21 mmol/L (ref 20–29)
Calcium: 10.2 mg/dL (ref 8.7–10.3)
Chloride: 105 mmol/L (ref 96–106)
Creatinine, Ser: 1.56 mg/dL — ABNORMAL HIGH (ref 0.57–1.00)
GFR calc Af Amer: 37 mL/min/{1.73_m2} — ABNORMAL LOW (ref >59)
GFR calc non Af Amer: 32 mL/min/{1.73_m2} — ABNORMAL LOW (ref >59)
Glucose: 75 mg/dL (ref 65–99)
Potassium: 4.8 mmol/L (ref 3.5–5.2)
Sodium: 142 mmol/L (ref 134–144)

## 2019-07-24 NOTE — Telephone Encounter (Signed)
-----   Message from Fay Records, MD sent at 07/24/2019  9:44 AM EST ----- Maxzide recently added for BP control to other meds I would recomm to continue for now   Make sure she is hydrated. F/U BMET one more time next week   If remains elevated will need to review for another med

## 2019-07-24 NOTE — Telephone Encounter (Signed)
Notified patient to continue taking 1/2 tablet of maxzide daily and be sure to stay hydrated.  She will come next Tue 07/31/19 for repeat BMET and is aware if creatinine remains elevated her medication my need changed for blood pressure.  She has f/u in htn clinic on 08/07/19.

## 2019-07-25 ENCOUNTER — Other Ambulatory Visit: Payer: Self-pay | Admitting: Family Medicine

## 2019-07-25 DIAGNOSIS — J069 Acute upper respiratory infection, unspecified: Secondary | ICD-10-CM

## 2019-07-31 ENCOUNTER — Other Ambulatory Visit: Payer: Self-pay

## 2019-07-31 ENCOUNTER — Other Ambulatory Visit: Payer: Medicare HMO | Admitting: *Deleted

## 2019-07-31 DIAGNOSIS — I10 Essential (primary) hypertension: Secondary | ICD-10-CM | POA: Diagnosis not present

## 2019-08-01 ENCOUNTER — Other Ambulatory Visit (HOSPITAL_COMMUNITY): Admission: RE | Admit: 2019-08-01 | Payer: Medicare HMO | Source: Ambulatory Visit

## 2019-08-01 LAB — BASIC METABOLIC PANEL
BUN/Creatinine Ratio: 15 (ref 12–28)
BUN: 20 mg/dL (ref 8–27)
CO2: 21 mmol/L (ref 20–29)
Calcium: 10.2 mg/dL (ref 8.7–10.3)
Chloride: 103 mmol/L (ref 96–106)
Creatinine, Ser: 1.34 mg/dL — ABNORMAL HIGH (ref 0.57–1.00)
GFR calc Af Amer: 45 mL/min/{1.73_m2} — ABNORMAL LOW (ref >59)
GFR calc non Af Amer: 39 mL/min/{1.73_m2} — ABNORMAL LOW (ref >59)
Glucose: 78 mg/dL (ref 65–99)
Potassium: 4.8 mmol/L (ref 3.5–5.2)
Sodium: 139 mmol/L (ref 134–144)

## 2019-08-04 ENCOUNTER — Ambulatory Visit (HOSPITAL_BASED_OUTPATIENT_CLINIC_OR_DEPARTMENT_OTHER): Payer: Medicare HMO | Admitting: Cardiology

## 2019-08-07 ENCOUNTER — Other Ambulatory Visit: Payer: Self-pay

## 2019-08-07 ENCOUNTER — Ambulatory Visit (INDEPENDENT_AMBULATORY_CARE_PROVIDER_SITE_OTHER): Payer: Medicare HMO | Admitting: Pharmacist

## 2019-08-07 VITALS — BP 132/80 | HR 77

## 2019-08-07 DIAGNOSIS — I10 Essential (primary) hypertension: Secondary | ICD-10-CM | POA: Diagnosis not present

## 2019-08-07 MED ORDER — FAMOTIDINE 20 MG PO TABS: ORAL_TABLET | ORAL | 1 refills | Status: DC

## 2019-08-07 NOTE — Patient Instructions (Addendum)
It was nice to meet you today  Your blood pressure goal is less than 130/17mmHg  Continue taking your current medications for blood pressure including metoprolol twice daily, losartan once daily, and Maxzide once daily  Continue to stay active, limit sodium and caffeine intake. Goal sodium intake is less than 2,000mg  per day  Call clinic with any blood pressure concerns [phone]

## 2019-08-07 NOTE — Progress Notes (Signed)
Patient ID: [name]                 DOB: 25-Jul-1946                      MRN: [id number]     HPI: [name] is a 74 y.o. female referred by Dr. Harrington Challenger to HTN clinic. PMH is significant for CAD s/p DES to mid left circumflex in 2018, HTN, HLD, DM, CKD s/p right nephrectomy, RA, and OSA. Pt was last seen in office 12/7 and BP was slightly elevated at 138/86 with higher readings reported at home (systolic in the 123456). She was started on 1/2 tablet of Maxzide daily. F/u BMET showed increase in SCr from 1.09 to 1.56, rechecked 1 week later and improved to 1.34 (baseline 1 year ago 1.32).  Pt presents today in good spirits. She is tolerating her Maxzide well. Denies dizziness, falls, headache, and major vision changes. Does not use NSAIDs, uses Tylenol occasionally. Feels better with BP improving - ranging 130/80 mostly at home. She took her meds > 1 hour ago. Helps care for her son who had a leg amputated and started dialysis recently - she has been busy caring for him and has been a bit more stressed lately. She brings home BP cuff to clinic today - home reading 138/86 home cuff, 132/80 clinic cuff - home cuff is a bit tight.  Current HTN meds: losartan 100mg  daily (AM), metoprolol tartrate 50mg  BID, [name] 37.5-25mg  1/2 tab daily (AM) Previously tried: losartan-HCTZ, atenolol-chlorthalidone BP goal: <130/16mmHg  Family History: The patient's family history includes Cancer in her brother; Dementia in her brother and brother; Diabetes in her brother and brother; Heart disease in her father; Kidney disease in her son; Parkinsonism in her brother; Prostate cancer in her father; Rheum arthritis in her mother; Stroke in her mother.   Social History: The patient  reports that she has been smoking cigarettes. She has a 3.00 pack-year smoking history. She has never used smokeless tobacco. She reports that she does not drink alcohol or use drugs.   Diet: Does not eat red meat. Likes [place],  chicken, seafood, spaghetti, vegetables. Eats most meals at home. Limits salt. Looks for heart healthy/low sodium soups. Drinks 1 Pepsi a day and 1 cup of coffee a day.  [facility]  Home BP readings: low 130s/80 - bicep cuff she has been using since August 2020  Wt Readings from Last 3 Encounters:  07/09/19 223 lb 1.9 oz (101.2 kg)  06/19/19 225 lb (102.1 kg)  06/13/19 222 lb (100.7 kg)   BP Readings from Last 3 Encounters:  07/09/19 138/86  06/19/19 (!) 169/86  06/13/19 132/80   Pulse Readings from Last 3 Encounters:  07/09/19 65  06/19/19 62  06/13/19 80    Renal function: CrCl cannot be calculated (Unknown ideal weight.).  Past Medical History:  Diagnosis Date  . Allergy   . Anxiety   . Arthritis    RA (Dr. Ouida Sills) Bilateral hands  . C. difficile diarrhea    history of  . CAD (coronary artery disease), native coronary artery    a. Cardiac CT with + FFR for Ramus b. cath 05/2017 100% sub acute lesion to mid circumflex artery s/p DES.  . Cancer ([place])    Right kidney CA removed.   . CKD (chronic kidney disease) stage 3, GFR 30-59 ml/min 10/11/2016   s/p R  nephrectomy  . Colon polyps 2008   HYPERPLASTIC  . Costochondritis    a. Nuc Stress Test 6/16: EF 70, no scar or ischemia, low risk // b. Echo 3/16: mild conc LVH, EF 65-70, no RWMA, Gr 1 DD, mild TR  . Diabetes mellitus without complication ([place])    type 2  no meds  . Dyspnea   . Gastritis   . GERD (gastroesophageal reflux disease)   . History of echocardiogram    Echo 3/18:  Moderate LVH, EF XX123456, grade 1 diastolic dysfunction, calcified aortic valve, mild MR, moderate LAE  . History of nuclear stress test    Myoview 3/18: Mod size and intensity fixed septal defect, may be artifact.Opposite mod size and intensity lat defect, which is reversible and could represent ischemia or possibly artifact (SDS 4). LVEF 71% with normal wall motion. Intermediate risk study. >>  images reviewed with Dr. Dorris Carnes - no sig ischemia; med rx   . History of pneumonia   . Hx of cardiovascular stress test    Lexiscan Myoview 6/16:  EF 70%, no scar or ischemia; Low Risk  . Hypertension   . Neuromuscular disorder ([place])    Neruopathy in bilateral feet  . Neuropathy   . Orthostatic hypotension 05/28/2017  . Osteoarthritis   . Pneumonia   . S/P angioplasty with stent 05/27/17 to LCX with DES  05/28/2017  . Thyroid disease     Current Outpatient Medications on File Prior to Visit  Medication Sig Dispense Refill  . acetaminophen (TYLENOL) 500 MG tablet Take 1,000 mg by mouth every 6 (six) hours as needed for moderate pain or headache.     [name] Kitchen aspirin 81 MG chewable tablet Chew 81 mg by mouth daily.    . clopidogrel (PLAVIX) 75 MG tablet TAKE ONE TABLET BY MOUTH ONE TIME DAILY  90 tablet 3  . colchicine 0.6 MG tablet Take 0.5 tablets (0.3 mg total) by mouth daily. 45 tablet 0  . diclofenac sodium (VOLTAREN) 1 % GEL Apply 2 g topically 3 (three) times daily as needed (for arm pain).    . DULoxetine (CYMBALTA) 30 MG capsule Take 1 capsule (30 mg total) by mouth daily. 30 capsule 1  . famotidine (PEPCID) 20 MG tablet TAKE 1 TABLET BY MOUTH TWICE DAILY AS NEEDED FOR HEARTBURN OR INDIGESTION 60 tablet 1  . fluticasone (FLONASE) 50 MCG/ACT nasal spray PLACE 2 SPRAYS INTO BOTH NOSTRILS AT BEDTIME AS NEEDED FOR ALLERGIES. 48 mL 1  . gabapentin (NEURONTIN) 100 MG capsule Take 1 capsule (100 mg total) by mouth 2 (two) times daily. 30 capsule 5  . gabapentin (NEURONTIN) 400 MG capsule Take 1 capsule (400 mg total) by mouth 2 (two) times daily. 180 capsule 4  . ipratropium (ATROVENT) 0.06 % nasal spray PLACE 2 SPRAYS INTO BOTH NOSTRILS 4 (FOUR) TIMES DAILY. 15 mL 2  . losartan (COZAAR) 100 MG tablet Take 1 tablet (100 mg total) by mouth daily. 90 tablet 1  . metoprolol tartrate (LOPRESSOR) 50 MG tablet Take 1 tablet (50 mg total) by mouth 2 (two) times daily. 180 tablet 1  . Multiple  Vitamin (MULTIVITAMIN) tablet Take 1 tablet by mouth daily.    . nitroGLYCERIN (NITROSTAT) 0.4 MG SL tablet Place 1 tablet (0.4 mg total) under the tongue every 5 (five) minutes as needed for chest pain. 30 tablet 6  . polyethylene glycol powder (GLYCOLAX/MIRALAX) powder Take 17 g by mouth daily. (Patient taking differently: Take 17 g by mouth every other day. )  500 g 3  . rosuvastatin (CRESTOR) 40 MG tablet Take 1 tablet (40 mg total) by mouth daily. 90 tablet 3  . triamterene-hydrochlorothiazide (MAXZIDE-25) 37.5-25 MG tablet Take 0.5 tablets by mouth daily. 45 tablet 3  . Vitamin D, Ergocalciferol, (DRISDOL) 1.25 MG (50000 UT) CAPS capsule 1 cap weekly x 8 weeks then every 2 weeks. 12 capsule 3   No current facility-administered medications on file prior to visit.    Allergies  Allergen Reactions  . Oxycodone-Acetaminophen Hives and Itching    hallucinations  . Percocet [Oxycodone-Acetaminophen] Hives, Itching and Other (See Comments)    hallucinations  . Pravastatin Sodium Other (See Comments)    Muscle aches Muscle aches  . Hydrocodone Other (See Comments)    "crazy dreams"  . Aspirin Other (See Comments)    GI upset Other reaction(s): Other (See Comments) REACTION: GI upset     Assessment/Plan:  1. Hypertension - BP is at goal <130/63mmHg in clinic and with home readings. Will continue losartan 100mg  daily, metoprolol tartrate 50mg  BID, and [name] 37.5-25mg  1/2 tab daily. Encouraged her to stay active and limit sodium intake to 000mg  daily. Pt will continue to monitor her BP at home and call clinic with concerns. F/u in HTN clinic as needed.  Candus Braud E. Lisha Vitale, PharmD, BCACP, [place] [id number] [place], [place] [address] Phone: [phone]; Fax: [phone] 08/07/2019 11:19 AM

## 2019-08-30 ENCOUNTER — Encounter: Payer: Self-pay | Admitting: Adult Health

## 2019-08-30 ENCOUNTER — Ambulatory Visit: Payer: Medicare HMO | Admitting: Adult Health

## 2019-08-30 ENCOUNTER — Other Ambulatory Visit: Payer: Self-pay

## 2019-08-30 VITALS — BP 116/76 | HR 69 | Temp 97.2°F | Ht 66.0 in | Wt 225.8 lb

## 2019-08-30 DIAGNOSIS — G6289 Other specified polyneuropathies: Secondary | ICD-10-CM

## 2019-08-30 MED ORDER — GABAPENTIN 100 MG PO CAPS: 100.0000 mg | ORAL_CAPSULE | Freq: Three times a day (TID) | ORAL | 11 refills | Status: DC

## 2019-08-30 NOTE — Patient Instructions (Signed)
Your Plan:  Continue gabapentin:  Take 500 mg at lunch ( take 400 mg and 100 mg tablet)  Take 100 mg at dinner time  Take 500 mg before bedtime ( take 400 mg and 100 mg tablet)  If your symptoms worsen or you develop new symptoms please let us know.   Thank you for coming to see [place] at [facility]. I hope we have been able to provide you high quality care today.  You may receive a patient satisfaction survey over the next few weeks. We would appreciate your feedback and comments so that we may continue to improve ourselves and the health of our patients.

## 2019-08-30 NOTE — Progress Notes (Signed)
PATIENT: [name] DOB: 04/28/1946  REASON FOR VISIT: follow up HISTORY FROM: patient  HISTORY OF PRESENT ILLNESS: Today 08/30/19:  [name] is a 74 year old female with a history of neuropathy.  She returns today for follow-up.  She states that she is having burning sensations in both hands.  She states that on occasion she will have sharp shooting pains in the feet and numbness.  The hands is what bothers her the most.  She is currently taking gabapentin 500 mg at 12 PM and again at 10 PM.  She denies any significant changes with her balance.  She states that she may feel off balance at times but denies any falls.  She was able to get the compounded cream and tries to use it at night.  She states that she cannot use it much during the day because she is constantly washing dishes or cleaning and the water washes it off.  In the past she has been diagnosed with a left carpal tunnel syndrome.  She reports that she did see a hand surgeon but he felt that most of her symptoms was due to neuropathy.  She returns today for an evaluation.   04/25/19:  [name] is a 74 year old female with a history of neuropathy.  She returns today for follow-up.  She reports that she has numbness in the hands.  She states they often feel swollen.  She denies any significant discomfort.  She reports that occasionally she will feel that something is sticking in her feet.  This is mainly worse at bedtime.  She continues on gabapentin 400 mg twice a day.  She denies any changes with her gait or balance.  She was on the compounded cream but last year the cost increased so she did not have it refilled.  She returns today for an evaluation.  HISTORY UPDATE (11/21/18, VRP): Since last visit, doingworse. Has had cervical spine surgery in Oct 2019.Symptoms arestable. Severityis moderate. No alleviating or aggravating factors. Toleratinggabapentin.  UPDATE (11/14/17, VRP): Since last visit, doing worse with pain in  left arm and left leg. Tolerating gabapentin 400mg  . No alleviating or aggravating factors.   UPDATE (09/19/17, MM): She states overall she is doing well. She does have burning and tingling in the left arm that is worse at night. She also reports she occasionally has burning in the feet. She states the compounded cream andgabapentin has been beneficial. She did see hand surgery for carpal tunnel but he did not feel that it was severe enough to complete surgery per the patient. The patient also reports that she is been having neck pain. She did see her primary care who felt that it was muscle spasms and placed on tizanidine. She returns today for evaluation.  UPDATE 03/16/17: Since last visit, doing a little better with neuropathy cream and gabapentin. Sxs stable. Planning to get second opinion [facility] rheumatology clinic.   PRIOR HPI (12/01/16):74 year old female with history of rheumatoid arthritis, here for evaluation of left hand numbness and pain. Patient reports numbness in bilateral hands and feet, especially left hand for past 6 months. She describes pain and tenderness in her left fifth digit radiating up to her left elbow. Patient denies any recent accidents or traumas. Patient has history of diabetes, hypertension, renal cell carcinoma, rheumatoid arthritis, currently on leflunomide. Patient presented for EMG nerve conduction study on 10/14/16 which showed diffuse widespread axonal polyneuropathy as well as superimposed left carpal tunnel syndrome. Patient has history of right carpal  tunnel syndrome status post surgery with good results.   REVIEW OF SYSTEMS: Out of a complete 14 system review of symptoms, the patient complains only of the following symptoms, and all other reviewed systems are negative.  Chills, fatigue, shortness of breath, apnea, snoring, joint pain, joint swelling, frequency of urination, depression  ALLERGIES: Allergies  Allergen Reactions  . Oxycodone-Acetaminophen  Hives and Itching    hallucinations  . Percocet [Oxycodone-Acetaminophen] Hives, Itching and Other (See Comments)    hallucinations  . Pravastatin Sodium Other (See Comments)    Muscle aches Muscle aches  . Hydrocodone Other (See Comments)    "crazy dreams"  . Aspirin Other (See Comments)    GI upset Other reaction(s): Other (See Comments) REACTION: GI upset    HOME MEDICATIONS: Outpatient Medications Prior to Visit  Medication Sig Dispense Refill  . acetaminophen (TYLENOL) 500 MG tablet Take 1,000 mg by mouth as needed for moderate pain or headache.     [name] Kitchen aspirin 81 MG chewable tablet Chew 81 mg by mouth daily.    . clopidogrel (PLAVIX) 75 MG tablet TAKE ONE TABLET BY MOUTH ONE TIME DAILY  90 tablet 3  . colchicine 0.6 MG tablet Take 0.5 tablets (0.3 mg total) by mouth daily. 45 tablet 0  . diclofenac sodium (VOLTAREN) 1 % GEL Apply 2 g topically as needed (for arm pain).     . DULoxetine (CYMBALTA) 30 MG capsule Take 30 mg by mouth daily.    . fluticasone (FLONASE) 50 MCG/ACT nasal spray PLACE 2 SPRAYS INTO BOTH NOSTRILS AT BEDTIME AS NEEDED FOR ALLERGIES. (Patient taking differently: Place 2 sprays into both nostrils at bedtime. ) 48 mL 1  . gabapentin (NEURONTIN) 100 MG capsule Take 1 capsule (100 mg total) by mouth 2 (two) times daily. 30 capsule 5  . gabapentin (NEURONTIN) 400 MG capsule Take 1 capsule (400 mg total) by mouth 2 (two) times daily. 180 capsule 4  . ipratropium (ATROVENT) 0.06 % nasal spray PLACE 2 SPRAYS INTO BOTH NOSTRILS 4 (FOUR) TIMES DAILY. 15 mL 2  . losartan (COZAAR) 100 MG tablet Take 1 tablet (100 mg total) by mouth daily. 90 tablet 1  . metoprolol tartrate (LOPRESSOR) 50 MG tablet Take 1 tablet (50 mg total) by mouth 2 (two) times daily. 180 tablet 1  . Multiple Vitamin (MULTIVITAMIN) tablet Take 1 tablet by mouth daily.    . nitroGLYCERIN (NITROSTAT) 0.4 MG SL tablet Place 1 tablet (0.4 mg total) under the tongue every 5 (five) minutes as needed for chest  pain. 30 tablet 6  . polyethylene glycol powder (GLYCOLAX/MIRALAX) powder Take 17 g by mouth daily. (Patient taking differently: Take 17 g by mouth every other day. ) 500 g 3  . rosuvastatin (CRESTOR) 40 MG tablet Take 1 tablet (40 mg total) by mouth daily. 90 tablet 3  . triamterene-hydrochlorothiazide (MAXZIDE-25) 37.5-25 MG tablet Take 0.5 tablets by mouth daily. 45 tablet 3  . DULoxetine (CYMBALTA) 30 MG capsule Take 1 capsule (30 mg total) by mouth daily. 30 capsule 1  . famotidine (PEPCID) 20 MG tablet TAKE 1 TABLET BY MOUTH TWICE DAILY AS NEEDED FOR HEARTBURN OR INDIGESTION 180 tablet 1  . Vitamin D, Ergocalciferol, (DRISDOL) 1.25 MG (50000 UT) CAPS capsule 1 cap weekly x 8 weeks then every 2 weeks. 12 capsule 3   No facility-administered medications prior to visit.    PAST MEDICAL HISTORY: Past Medical History:  Diagnosis Date  . Allergy   . Anxiety   . Arthritis  RA (Dr. Ouida Sills) Bilateral hands  . C. difficile diarrhea    history of  . CAD (coronary artery disease), native coronary artery    a. Cardiac CT with + FFR for Ramus b. cath 05/2017 100% sub acute lesion to mid circumflex artery s/p DES.  . Cancer ([place])    Right kidney CA removed.   . CKD (chronic kidney disease) stage 3, GFR 30-59 ml/min 10/11/2016   s/p R nephrectomy  . Colon polyps 2008   HYPERPLASTIC  . Costochondritis    a. Nuc Stress Test 6/16: EF 70, no scar or ischemia, low risk // b. Echo 3/16: mild conc LVH, EF 65-70, no RWMA, Gr 1 DD, mild TR  . Diabetes mellitus without complication ([place])    type 2  no meds  . Dyspnea   . Gastritis   . GERD (gastroesophageal reflux disease)   . History of echocardiogram    Echo 3/18:  Moderate LVH, EF [id number], grade 1 diastolic dysfunction, calcified aortic valve, mild MR, moderate LAE  . History of nuclear stress test    Myoview 3/18: Mod size and intensity fixed septal defect, may be artifact.Opposite mod size and intensity lat defect, which is reversible and  could represent ischemia or possibly artifact (SDS 4). LVEF 71% with normal wall motion. Intermediate risk study. >> images reviewed with Dr. Dorris Carnes - no sig ischemia; med rx   . History of pneumonia   . Hx of cardiovascular stress test    Lexiscan Myoview 6/16:  EF 70%, no scar or ischemia; Low Risk  . Hypertension   . Neuromuscular disorder ([place])    Neruopathy in bilateral feet  . Neuropathy   . Orthostatic hypotension 05/28/2017  . Osteoarthritis   . Pneumonia   . S/P angioplasty with stent 05/27/17 to LCX with DES  05/28/2017  . Thyroid disease     PAST SURGICAL HISTORY: Past Surgical History:  Procedure Laterality Date  . ABDOMINAL HYSTERECTOMY  1978  . ANTERIOR CERVICAL DECOMP/DISCECTOMY FUSION N/A 05/29/2018   Procedure: ANTERIOR CERVICAL DECOMPRESSION FUSION - CERVICAL FIVE-CERVICAL SIX - CERVICAL SIX-CERVICAL SEVEN;  Surgeon: Earnie Larsson, MD;  Location: [facility];  Service: Neurosurgery;  Laterality: N/A;  . BACK SURGERY     x 2  . CARPAL TUNNEL RELEASE Left   . COLONOSCOPY W/ BIOPSIES AND POLYPECTOMY     Hx: of  . CORONARY STENT INTERVENTION N/A 05/27/2017   Procedure: CORONARY STENT INTERVENTION;  Surgeon: Burnell Blanks, MD;  Location: [facility];  Service: Cardiovascular;  Laterality: N/A;  . ESOPHAGOGASTRODUODENOSCOPY    . HNP    . LEFT HEART CATH AND CORONARY ANGIOGRAPHY N/A 05/27/2017   Procedure: LEFT HEART CATH AND CORONARY ANGIOGRAPHY;  Surgeon: Burnell Blanks, MD;  Location: [place] [facility];  Service: Cardiovascular;  Laterality: N/A;  . LUMBAR LAMINECTOMY/DECOMPRESSION MICRODISCECTOMY Left 02/12/2013   Procedure: LUMBAR TWO THREE, LUMBAR THREE FOUR, LUMBAR FOUR FIVE  LAMINECTOMY/DECOMPRESSION MICRODISCECTOMY 3 LEVELS;  Surgeon: Charlie Pitter, MD;  Location: [facility];  Service: Neurosurgery;  Laterality: Left;  . NEPHRECTOMY Right 2010   10.rcc cancer  . TOTAL KNEE ARTHROPLASTY Right    Redo    FAMILY HISTORY: Family History    Problem Relation Age of Onset  . Rheum arthritis Mother   . Stroke Mother   . Prostate cancer Father   . Heart disease Father   . Diabetes Brother   . Dementia Brother   . Parkinsonism Brother   . Kidney  disease Son   . Diabetes Brother   . Cancer Brother   . Dementia Brother   . Colon cancer Neg Hx   . Esophageal cancer Neg Hx   . Pancreatic cancer Neg Hx   . Liver disease Neg Hx     SOCIAL HISTORY: Social History   Socioeconomic History  . Marital status: Divorced    Spouse name: Not on file  . Number of children: 3  . Years of education: 65  . Highest education level: Not on file  Occupational History  . Occupation: Retired    Fish farm manager: RETIRED  Tobacco Use  . Smoking status: Current Every Day Smoker    Packs/day: 0.20    Years: 15.00    Pack years: 3.00    Types: Cigarettes  . Smokeless tobacco: Never Used  . Tobacco comment: 03/16/17  1 pk/week  Substance and Sexual Activity  . Alcohol use: No    Alcohol/week: 0.0 standard drinks  . Drug use: No  . Sexual activity: Not on file  Other Topics Concern  . Not on file  Social History Narrative   Single, dgtr lives with her   Never Smoked    Alcohol use- no   Drug use-no   Regular Exercise-yes   Former Smoker- 12/2008   Social Determinants of Health   Financial Resource Strain:   . Difficulty of Paying Living Expenses: Not on file  Food Insecurity:   . Worried About Charity fundraiser in the Last Year: Not on file  . Ran Out of Food in the Last Year: Not on file  Transportation Needs:   . Lack of Transportation (Medical): Not on file  . Lack of Transportation (Non-Medical): Not on file  Physical Activity:   . Days of Exercise per Week: Not on file  . Minutes of Exercise per Session: Not on file  Stress:   . Feeling of Stress : Not on file  Social Connections:   . Frequency of Communication with Friends and Family: Not on file  . Frequency of Social Gatherings with Friends and Family: Not on file   . Attends Religious Services: Not on file  . Active Member of Clubs or Organizations: Not on file  . Attends Archivist Meetings: Not on file  . Marital Status: Not on file  Intimate Partner Violence:   . Fear of Current or Ex-Partner: Not on file  . Emotionally Abused: Not on file  . Physically Abused: Not on file  . Sexually Abused: Not on file      PHYSICAL EXAM  Vitals:   08/30/19 1008  BP: 116/76  Pulse: 69  Temp: (!) 97.2 F (36.2 C)  TempSrc: Oral  Weight: 225 lb 12.8 oz (102.4 kg)  Height: 5\' 6"  (1.676 m)   Body mass index is 36.45 kg/m.  Generalized: Well developed, in no acute distress   Neurological examination  Mentation: Alert oriented to time, place, history taking. Follows all commands speech and language fluent Cranial nerve II-XII: Pupils were equal round reactive to light. Extraocular movements were full, visual field were full on confrontational test.  Head turning and shoulder shrug  were normal and symmetric. Motor: The motor testing reveals 5 over 5 strength of all 4 extremities. Good symmetric motor tone is noted throughout.  Sensory: Sensory testing is intact to soft touch on all 4 extremities. No evidence of extinction is noted.  Coordination: Cerebellar testing reveals good finger-nose-finger and heel-to-shin bilaterally.  Gait and station: Gait is  normal.  Reflexes: Deep tendon reflexes are symmetric and normal bilaterally.   DIAGNOSTIC DATA (LABS, IMAGING, TESTING) - I reviewed patient records, labs, notes, testing and imaging myself where available.  Lab Results  Component Value Date   WBC 7.0 07/21/2018   HGB 13.6 07/21/2018   HCT 40.6 07/21/2018   MCV 87 07/21/2018   PLT 246 07/21/2018      Component Value Date/Time   NA 139 07/31/2019 1418   K 4.8 07/31/2019 1418   CL 103 07/31/2019 1418   CO2 21 07/31/2019 1418   GLUCOSE 78 07/31/2019 1418   GLUCOSE 85 03/26/2019 1143   BUN 20 07/31/2019 1418   CREATININE 1.34 (H)  07/31/2019 1418   CREATININE 1.18 (H) 10/01/2016 1622   CALCIUM 10.2 07/31/2019 1418   PROT 7.3 02/27/2019 1023   ALBUMIN 4.4 02/27/2019 1023   AST 21 02/27/2019 1023   ALT 11 02/27/2019 1023   ALKPHOS 101 02/27/2019 1023   BILITOT 0.4 02/27/2019 1023   GFRNONAA 39 (L) 07/31/2019 1418   GFRAA 45 (L) 07/31/2019 1418   Lab Results  Component Value Date   CHOL 142 02/27/2019   HDL 70 02/27/2019   LDLCALC 56 02/27/2019   LDLDIRECT 171.5 12/06/2011   TRIG 78 02/27/2019   CHOLHDL 2.0 02/27/2019   Lab Results  Component Value Date   HGBA1C 6.4 03/26/2019   Lab Results  Component Value Date   [id number] 12/01/2016   Lab Results  Component Value Date   TSH 2.200 09/14/2018      ASSESSMENT AND PLAN 74 y.o. year old female  has a past medical history of Allergy, Anxiety, Arthritis, C. difficile diarrhea, CAD (coronary artery disease), native coronary artery, Cancer ([place]), CKD (chronic kidney disease) stage 3, GFR 30-59 ml/min (10/11/2016), Colon polyps (2008), Costochondritis, Diabetes mellitus without complication ([place]), Dyspnea, Gastritis, GERD (gastroesophageal reflux disease), History of echocardiogram, History of nuclear stress test, History of pneumonia, cardiovascular stress test, Hypertension, Neuromuscular disorder ([place]), Neuropathy, Orthostatic hypotension (05/28/2017), Osteoarthritis, Pneumonia, S/P angioplasty with stent 05/27/17 to LCX with DES  (05/28/2017), and Thyroid disease. here with:  1.  Axonal neuropathy  -Increase gabapentin taking 500 mg at 12 PM, 100 mg at dinnertime and 500 mg at bedtime -Continue compounded cream -Advised if symptoms do not improve she should let us know -Follow-up in 6 months or sooner if needed   I spent 15 minutes with the patient. 50% of this time was spent discussing worsening symptoms, increase in medication and plan of care   Ward Givens, MSN, NP-C 08/30/2019, 10:26 AM [facility] [address],  [place], [place] [address] [phone]

## 2019-08-31 NOTE — Progress Notes (Signed)
I have read the note, and I agree with the clinical assessment and plan.  Charles K Willis   

## 2019-09-24 ENCOUNTER — Other Ambulatory Visit: Payer: Self-pay | Admitting: Family Medicine

## 2019-09-25 ENCOUNTER — Other Ambulatory Visit: Payer: Self-pay

## 2019-09-26 ENCOUNTER — Ambulatory Visit (INDEPENDENT_AMBULATORY_CARE_PROVIDER_SITE_OTHER): Payer: Medicare HMO | Admitting: Family Medicine

## 2019-09-26 ENCOUNTER — Encounter: Payer: Self-pay | Admitting: Family Medicine

## 2019-09-26 ENCOUNTER — Ambulatory Visit (INDEPENDENT_AMBULATORY_CARE_PROVIDER_SITE_OTHER): Payer: Medicare HMO

## 2019-09-26 VITALS — BP 112/70 | HR 68 | Resp 12 | Ht 66.0 in | Wt 230.0 lb

## 2019-09-26 DIAGNOSIS — Z85528 Personal history of other malignant neoplasm of kidney: Secondary | ICD-10-CM | POA: Insufficient documentation

## 2019-09-26 DIAGNOSIS — N183 Chronic kidney disease, stage 3 unspecified: Secondary | ICD-10-CM

## 2019-09-26 DIAGNOSIS — E1122 Type 2 diabetes mellitus with diabetic chronic kidney disease: Secondary | ICD-10-CM | POA: Diagnosis not present

## 2019-09-26 DIAGNOSIS — C649 Malignant neoplasm of unspecified kidney, except renal pelvis: Secondary | ICD-10-CM

## 2019-09-26 DIAGNOSIS — E559 Vitamin D deficiency, unspecified: Secondary | ICD-10-CM

## 2019-09-26 DIAGNOSIS — M7542 Impingement syndrome of left shoulder: Secondary | ICD-10-CM

## 2019-09-26 DIAGNOSIS — G959 Disease of spinal cord, unspecified: Secondary | ICD-10-CM | POA: Diagnosis not present

## 2019-09-26 DIAGNOSIS — R5383 Other fatigue: Secondary | ICD-10-CM

## 2019-09-26 DIAGNOSIS — M19012 Primary osteoarthritis, left shoulder: Secondary | ICD-10-CM | POA: Diagnosis not present

## 2019-09-26 DIAGNOSIS — M059 Rheumatoid arthritis with rheumatoid factor, unspecified: Secondary | ICD-10-CM

## 2019-09-26 DIAGNOSIS — E1121 Type 2 diabetes mellitus with diabetic nephropathy: Secondary | ICD-10-CM

## 2019-09-26 DIAGNOSIS — I119 Hypertensive heart disease without heart failure: Secondary | ICD-10-CM

## 2019-09-26 DIAGNOSIS — N1831 Chronic kidney disease, stage 3a: Secondary | ICD-10-CM

## 2019-09-26 LAB — BASIC METABOLIC PANEL
BUN: 21 mg/dL (ref 6–23)
CO2: 27 mEq/L (ref 19–32)
Calcium: 9.8 mg/dL (ref 8.4–10.5)
Chloride: 106 mEq/L (ref 96–112)
Creatinine, Ser: 1.23 mg/dL — ABNORMAL HIGH (ref 0.40–1.20)
GFR: 51.41 mL/min — ABNORMAL LOW (ref >60.00)
Glucose, Bld: 77 mg/dL (ref 70–99)
Potassium: 4.4 mEq/L (ref 3.5–5.1)
Sodium: 140 mEq/L (ref 135–145)

## 2019-09-26 LAB — TSH: TSH: 1.87 u[IU]/mL (ref 0.35–4.50)

## 2019-09-26 LAB — POCT GLUCOSE (DEVICE FOR HOME USE): Glucose Fasting, POC: 72 mg/dL (ref 70–99)

## 2019-09-26 LAB — MICROALBUMIN / CREATININE URINE RATIO
Creatinine,U: 114.7 mg/dL
Microalb Creat Ratio: 14.8 mg/g (ref 0.0–30.0)
Microalb, Ur: 16.9 mg/dL — ABNORMAL HIGH (ref 0.0–1.9)

## 2019-09-26 LAB — VITAMIN D 25 HYDROXY (VIT D DEFICIENCY, FRACTURES): VITD: 17.09 ng/mL — ABNORMAL LOW (ref 30.00–100.00)

## 2019-09-26 MED ORDER — DULOXETINE HCL 30 MG PO CPEP: 30.0000 mg | ORAL_CAPSULE | Freq: Every day | ORAL | 1 refills | Status: DC

## 2019-09-26 NOTE — Patient Instructions (Addendum)
A few things to remember from today's visit:   Type 2 diabetes mellitus with stage 3 chronic kidney disease, without long-term current use of insulin, unspecified whether stage 3a or 3b CKD ([place]) - Plan: Microalbumin / creatinine urine ratio, Basic metabolic panel, Hemoglobin A1c, TSH  Malignant neoplasm of kidney excluding renal pelvis, unspecified laterality (HCC)  Cervical myelopathy (HCC), Chronic  Rheumatoid arthritis with positive rheumatoid factor, involving unspecified site ([place]), Chronic  Hyperlipidemia associated with type 2 diabetes mellitus ([place]), Chronic  Hypertension with heart disease  Vitamin D deficiency, unspecified - Plan: VITAMIN D 25 Hydroxy (Vit-D Deficiency, Fractures)  Stage 3a chronic kidney disease - Plan: Microalbumin / creatinine urine ratio, Basic metabolic panel  Impingement syndrome of left shoulder - Plan: Ambulatory referral to Physical Therapy  Resume Cymbalta 30 mg daily. No changes in the rest of the medications. Arrange your sleep study.  Please be sure medication list is accurate. If a new problem present, please set up appointment sooner than planned today.

## 2019-09-26 NOTE — Assessment & Plan Note (Signed)
Following with rheumatologist. 

## 2019-09-26 NOTE — Assessment & Plan Note (Signed)
She is not on vitamin D supplementation. Further recommendation will be given according to 25 [place] vitamin D results.

## 2019-09-26 NOTE — Assessment & Plan Note (Addendum)
Tinea losartan. Adequate BP and glucose control. Low-salt diet and NSAID's avoidance. Adequate hydration.

## 2019-09-26 NOTE — Assessment & Plan Note (Signed)
HgA1C has been at goal. Continue non pharmacologic treatment. Regular exercise and healthy diet with avoidance of added sugar food intake is an important part of treatment and recommended. Annual eye exam, periodic dental and foot care recommended. F/U in 5-6 months  

## 2019-09-26 NOTE — Assessment & Plan Note (Signed)
Problem is is stable. Following with neurosurgeon.

## 2019-09-26 NOTE — Assessment & Plan Note (Signed)
S/P right nephrectomy. Following with urologist annually.

## 2019-09-26 NOTE — Progress Notes (Signed)
HPI:   [name] is a 74 y.o. female, who is here today for chronic disease management.  She was last seen on 06/13/2019, since the she has seen her neurologist and her cardiologist.  No new problems since her last visit.  Hypertension: Currently she is on losartan 100 mg daily, [name] 37.5-25 mg 1/2 tablet daily, and metoprolol tartrate 50 mg twice daily. Checking BP regularly, readings similar to BP today.  + CKD III, she has not noted gross hematuria, decreased urine output, or foamy urine. Negative for severe/frequent headache, visual changes, chest pain, dyspnea, palpitation,focal weakness, or edema.  Lab Results  Component Value Date   CREATININE 1.34 (H) 07/31/2019   BUN 20 07/31/2019   NA 139 07/31/2019   K 4.8 07/31/2019   CL 103 07/31/2019   CO2 21 07/31/2019    DM2: Currently she is on nonpharmacologic treatment. She is not checking BS regularly. Currently she is on nonpharmacologic treatment. Denies abdominal pain, nausea,vomiting, polydipsia,polyuria, or polyphagia.    Lab Results  Component Value Date   HGBA1C 6.4 03/26/2019    Lab Results  Component Value Date   MICROALBUR 48.7 (H) 03/26/2019   MICROALBUR 51.3 (H) 08/16/2018   Today she is c/o fatigue, worse for the past months or so. She feels better after taking naps. Frequently she falls asleep while watching TV but not while driving.  Recently diagnosed with OSA, she has not been able to do second sleep study. She is also on gabapentin 100 mg 3 times daily and 400 mg twice daily for polyneuropathy.  She is having some anxiety and stress, she would like to resume Cymbalta 30 mg daily.  -Past hx or renal malignancy, s/p right nephrectomy. Last time she saw her urology was a year ago.  Also complaining about 3 weeks of left shoulder pain and limitation of range of motion, getting worse. Pain is intermittent. Exacerbated by certain movements. She has difficulty unhooking  her bra and taking off/putting on shirts. She has not noted edema or erythema.  No history of trauma or unusual physical activity. No new onset of numbness or tingling.  She has taken Tylenol. Hx of cervical radiculopathy, follows with neurosurgeon and has been stable.  RA, she follows S/P with rheumatologist.  Review of Systems  Constitutional: Negative for activity change, appetite change and fever.  HENT: Negative for mouth sores, nosebleeds and sore throat.   Respiratory: Negative for cough and wheezing.   Gastrointestinal: Negative for abdominal pain, nausea and vomiting.       Negative for changes in bowel habits.  Genitourinary: Negative for difficulty urinating and dysuria.  Musculoskeletal: Positive for arthralgias and neck pain. Negative for gait problem.  Skin: Negative for pallor and wound.  Allergic/Immunologic: Positive for environmental allergies.  Neurological: Positive for numbness. Negative for syncope and facial asymmetry.  Psychiatric/Behavioral: Negative for confusion. The patient is nervous/anxious.   Rest of ROS, see pertinent positives sand negatives in HPI  Current Outpatient Medications on File Prior to Visit  Medication Sig Dispense Refill  . acetaminophen (TYLENOL) 500 MG tablet Take 1,000 mg by mouth as needed for moderate pain or headache.     [name] Kitchen aspirin 81 MG chewable tablet Chew 81 mg by mouth daily.    . clopidogrel (PLAVIX) 75 MG tablet TAKE ONE TABLET BY MOUTH ONE TIME DAILY  90 tablet 3  . colchicine 0.6 MG tablet Take 0.5 tablets (0.3 mg total) by mouth daily. 45 tablet 0  .  diclofenac sodium (VOLTAREN) 1 % GEL Apply 2 g topically as needed (for arm pain).     . famotidine (PEPCID) 20 MG tablet TAKE 1 TABLET BY MOUTH TWICE DAILY AS NEEDED FOR HEARTBURN OR INDIGESTION 60 tablet 1  . fluticasone (FLONASE) 50 MCG/ACT nasal spray PLACE 2 SPRAYS INTO BOTH NOSTRILS AT BEDTIME AS NEEDED FOR ALLERGIES. (Patient taking differently: Place 2 sprays into both  nostrils at bedtime. ) 48 mL 1  . gabapentin (NEURONTIN) 100 MG capsule Take 1 capsule (100 mg total) by mouth 3 (three) times daily. 90 capsule 11  . gabapentin (NEURONTIN) 400 MG capsule Take 1 capsule (400 mg total) by mouth 2 (two) times daily. 180 capsule 4  . losartan (COZAAR) 100 MG tablet Take 1 tablet (100 mg total) by mouth daily. 90 tablet 1  . metoprolol tartrate (LOPRESSOR) 50 MG tablet Take 1 tablet (50 mg total) by mouth 2 (two) times daily. 180 tablet 1  . Multiple Vitamin (MULTIVITAMIN) tablet Take 1 tablet by mouth daily.    . nitroGLYCERIN (NITROSTAT) 0.4 MG SL tablet Place 1 tablet (0.4 mg total) under the tongue every 5 (five) minutes as needed for chest pain. 30 tablet 6  . polyethylene glycol powder (GLYCOLAX/MIRALAX) powder Take 17 g by mouth daily. (Patient taking differently: Take 17 g by mouth every other day. ) 500 g 3  . rosuvastatin (CRESTOR) 40 MG tablet Take 1 tablet (40 mg total) by mouth daily. 90 tablet 3  . triamterene-hydrochlorothiazide (MAXZIDE-25) 37.5-25 MG tablet Take 0.5 tablets by mouth daily. 45 tablet 3   No current facility-administered medications on file prior to visit.   Past Medical History:  Diagnosis Date  . Allergy   . Anxiety   . Arthritis    RA (Dr. Ouida Sills) Bilateral hands  . C. difficile diarrhea    history of  . CAD (coronary artery disease), native coronary artery    a. Cardiac CT with + FFR for Ramus b. cath 05/2017 100% sub acute lesion to mid circumflex artery s/p DES.  . Cancer ([place])    Right kidney CA removed.   . CKD (chronic kidney disease) stage 3, GFR 30-59 ml/min 10/11/2016   s/p R nephrectomy  . Colon polyps 2008   HYPERPLASTIC  . Costochondritis    a. Nuc Stress Test 6/16: EF 70, no scar or ischemia, low risk // b. Echo 3/16: mild conc LVH, EF 65-70, no RWMA, Gr 1 DD, mild TR  . Diabetes mellitus without complication ([place])    type 2  no meds  . Dyspnea   . Gastritis   . GERD (gastroesophageal reflux disease)    . History of echocardiogram    Echo 3/18:  Moderate LVH, EF XX123456, grade 1 diastolic dysfunction, calcified aortic valve, mild MR, moderate LAE  . History of nuclear stress test    Myoview 3/18: Mod size and intensity fixed septal defect, may be artifact.Opposite mod size and intensity lat defect, which is reversible and could represent ischemia or possibly artifact (SDS 4). LVEF 71% with normal wall motion. Intermediate risk study. >> images reviewed with Dr. Dorris Carnes - no sig ischemia; med rx   . History of pneumonia   . Hx of cardiovascular stress test    Lexiscan Myoview 6/16:  EF 70%, no scar or ischemia; Low Risk  . Hypertension   . Neuromuscular disorder ([place])    Neruopathy in bilateral feet  . Neuropathy   . Orthostatic hypotension 05/28/2017  . Osteoarthritis   .  Pneumonia   . S/P angioplasty with stent 05/27/17 to LCX with DES  05/28/2017  . Thyroid disease    Allergies  Allergen Reactions  . Oxycodone-Acetaminophen Hives and Itching    hallucinations  . Percocet [Oxycodone-Acetaminophen] Hives, Itching and Other (See Comments)    hallucinations  . Pravastatin Sodium Other (See Comments)    Muscle aches Muscle aches  . Hydrocodone Other (See Comments)    "crazy dreams"  . Aspirin Other (See Comments)    GI upset Other reaction(s): Other (See Comments) REACTION: GI upset    Social History   Socioeconomic History  . Marital status: Divorced    Spouse name: Not on file  . Number of children: 3  . Years of education: 68  . Highest education level: Not on file  Occupational History  . Occupation: Retired    Fish farm manager: RETIRED  Tobacco Use  . Smoking status: Current Every Day Smoker    Packs/day: 0.20    Years: 15.00    Pack years: 3.00    Types: Cigarettes  . Smokeless tobacco: Never Used  . Tobacco comment: 03/16/17  1 pk/week  Substance and Sexual Activity  . Alcohol use: No    Alcohol/week: 0.0 standard drinks  . Drug use: No  . Sexual activity: Not  on file  Other Topics Concern  . Not on file  Social History Narrative   Single, dgtr lives with her   Never Smoked    Alcohol use- no   Drug use-no   Regular Exercise-yes   Former Smoker- 12/2008   Social Determinants of Health   Financial Resource Strain:   . Difficulty of Paying Living Expenses: Not on file  Food Insecurity:   . Worried About Charity fundraiser in the Last Year: Not on file  . Ran Out of Food in the Last Year: Not on file  Transportation Needs:   . Lack of Transportation (Medical): Not on file  . Lack of Transportation (Non-Medical): Not on file  Physical Activity:   . Days of Exercise per Week: Not on file  . Minutes of Exercise per Session: Not on file  Stress:   . Feeling of Stress : Not on file  Social Connections:   . Frequency of Communication with Friends and Family: Not on file  . Frequency of Social Gatherings with Friends and Family: Not on file  . Attends Religious Services: Not on file  . Active Member of Clubs or Organizations: Not on file  . Attends Archivist Meetings: Not on file  . Marital Status: Not on file    Vitals:   09/26/19 1144  BP: 112/70  Pulse: 68  Resp: 12  SpO2: 97%   Body mass index is 37.12 kg/m.   Physical Exam  Nursing note and vitals reviewed. Constitutional: She is oriented to person, place, and time. She appears well-developed. No distress.  HENT:  Head: Normocephalic and atraumatic.  Mouth/Throat: Oropharynx is clear and moist and mucous membranes are normal.  Eyes: Pupils are equal, round, and reactive to light. Conjunctivae are normal.  Cardiovascular: Normal rate and regular rhythm.  No murmur heard. Pulses:      Dorsalis pedis pulses are 2+ on the right side and 2+ on the left side.  Respiratory: Effort normal and breath sounds normal. No respiratory distress.  GI: Soft. She exhibits no mass. There is no hepatomegaly. There is no abdominal tenderness.  Musculoskeletal:        General:  No edema.  Comments: Right shoulder: No deformity, edema, or erythema appreciated.No muscle atrophy. Luan Pulling' test pos, drop arm rotator cuff test pos, empty can supraspinatus test pos, cross body adduction test pos, lift-Off Subscapularis test pos. ROM limited, active < 90 degrees, passive 90 degrees.  Lymphadenopathy:    She has no cervical adenopathy.  Neurological: She is alert and oriented to person, place, and time. She has normal strength. No cranial nerve deficit. Gait normal.  Skin: Skin is warm. No rash noted. No erythema.  Psychiatric: Her mood appears anxious.  Well groomed, good eye contact.     ASSESSMENT AND PLAN:  [name] was seen today for chronic disease management.  Orders Placed This Encounter  Procedures  . DG Shoulder Left  . Microalbumin / creatinine urine ratio  . Basic metabolic panel  . Hemoglobin A1c  . TSH  . VITAMIN D 25 Hydroxy (Vit-D Deficiency, Fractures)  . Ambulatory referral to Physical Therapy  . POCT Glucose (Device for Home Use)     Impingement syndrome of left shoulder Possible etiologies discussed. Examination suggest rotator cuff tendinitis. We discussed prognosis and treatment options. She would like subacromial injection.  After verbal consent and discussion of risk and benefits of procedure and in sterile fashion,40 mg of Kenalog and 2 cc of lidocaine 1% were injected in the subacromial space. She tolerated procedure well. Range of motion improved 5 to10 minutes after the procedure.  PT will be arranged. Post procedure recommendations given.  Fatigue, unspecified type We discussed possible etiologies: Systemic illness, immunologic,endocrinology,sleep disorder, psychiatric/psychologic, infectious,medications side effects, and idiopathic. Examination today does not suggest a serious process.    Recommend arranging sleep study. Further recommendation will be given according to lab results.  Further recommendations  will be given according to lab results.  Hypertension with heart disease BP adequately controlled. No changes in current management. Continue monitoring BP regularly. Recommend low-salt diet. Eye exam is current.  Cervical myelopathy (HCC) Problem is is stable. Following with neurosurgeon.  Rheumatoid arthritis ([place]) Following with rheumatologist.  Malignant neoplasm of kidney excluding renal pelvis, unspecified laterality ([place]) S/P right nephrectomy. Following with urologist annually.  CKD (chronic kidney disease) stage 3, GFR 30-59 ml/min (HCC) Tinea losartan. Adequate BP and glucose control. Low-salt diet and NSAID's avoidance. Adequate hydration.  Hyperlipidemia associated with type 2 diabetes mellitus ([place])    Vitamin D deficiency, unspecified She is not on vitamin D supplementation. Further recommendation will be given according to 25 OH vitamin D results.  DM (diabetes mellitus), type 2 with renal complications ([place]) [id number] has been at goal. Continue nonpharmacologic treatment. Regular exercise and healthy diet with avoidance of added sugar food intake is an important part of treatment and recommended. Annual eye exam, periodic dental and foot care recommended. F/U in 5-6 months   During post procedure observation: 1:25 pm feeling hot flashes,so will stay for other 15 min.  Glucose 72. States that she gets hot flashes when she is hungry. Apple juice given. 1:44 pm ,she is feeling better,she feels like back to her baseline, discharged. Instructed about warning signs.    Return in about 6 months (around 03/25/2020).   Clella Mckeel G. [name], MD  [facility]. [place] office.

## 2019-09-26 NOTE — Assessment & Plan Note (Signed)
BP adequately controlled. No changes in current management. Continue monitoring BP regularly. Recommend low-salt diet. Eye exam is current.

## 2019-09-27 ENCOUNTER — Encounter: Payer: Self-pay | Admitting: Family Medicine

## 2019-09-27 LAB — HEMOGLOBIN A1C: Hgb A1c MFr Bld: 6.2 % (ref 4.6–6.5)

## 2019-10-03 ENCOUNTER — Other Ambulatory Visit: Payer: Self-pay

## 2019-10-03 MED ORDER — VITAMIN D (ERGOCALCIFEROL) 1.25 MG (50000 UNIT) PO CAPS: ORAL_CAPSULE | ORAL | 3 refills | Status: DC

## 2019-10-09 DIAGNOSIS — R32 Unspecified urinary incontinence: Secondary | ICD-10-CM | POA: Diagnosis not present

## 2019-10-10 ENCOUNTER — Ambulatory Visit: Payer: Medicare HMO | Admitting: Physical Therapy

## 2019-10-12 DIAGNOSIS — K219 Gastro-esophageal reflux disease without esophagitis: Secondary | ICD-10-CM | POA: Diagnosis not present

## 2019-10-12 DIAGNOSIS — E785 Hyperlipidemia, unspecified: Secondary | ICD-10-CM | POA: Diagnosis not present

## 2019-10-12 DIAGNOSIS — I739 Peripheral vascular disease, unspecified: Secondary | ICD-10-CM | POA: Diagnosis not present

## 2019-10-12 DIAGNOSIS — G629 Polyneuropathy, unspecified: Secondary | ICD-10-CM | POA: Diagnosis not present

## 2019-10-12 DIAGNOSIS — I252 Old myocardial infarction: Secondary | ICD-10-CM | POA: Diagnosis not present

## 2019-10-12 DIAGNOSIS — Z008 Encounter for other general examination: Secondary | ICD-10-CM | POA: Diagnosis not present

## 2019-10-12 DIAGNOSIS — I2510 Atherosclerotic heart disease of native coronary artery without angina pectoris: Secondary | ICD-10-CM | POA: Diagnosis not present

## 2019-10-12 DIAGNOSIS — I10 Essential (primary) hypertension: Secondary | ICD-10-CM | POA: Diagnosis not present

## 2019-10-12 DIAGNOSIS — R69 Illness, unspecified: Secondary | ICD-10-CM | POA: Diagnosis not present

## 2019-10-16 ENCOUNTER — Other Ambulatory Visit: Payer: Self-pay

## 2019-10-16 ENCOUNTER — Ambulatory Visit: Payer: Medicare HMO | Attending: Family Medicine

## 2019-10-16 DIAGNOSIS — M25612 Stiffness of left shoulder, not elsewhere classified: Secondary | ICD-10-CM | POA: Diagnosis not present

## 2019-10-16 DIAGNOSIS — M25512 Pain in left shoulder: Secondary | ICD-10-CM | POA: Diagnosis not present

## 2019-10-16 DIAGNOSIS — G8929 Other chronic pain: Secondary | ICD-10-CM | POA: Insufficient documentation

## 2019-10-16 DIAGNOSIS — R293 Abnormal posture: Secondary | ICD-10-CM | POA: Diagnosis not present

## 2019-10-16 DIAGNOSIS — R252 Cramp and spasm: Secondary | ICD-10-CM | POA: Diagnosis not present

## 2019-10-16 NOTE — Therapy (Signed)
[facility] [address] [place], [place], [address] Phone: [phone]   Fax:  [phone]  Physical Therapy Evaluation  Patient Details  Name: [name] MRN: [id number] Date of Birth: 10-16-1945 Referring Provider (PT): [name], Betty, MD   Encounter Date: 10/16/2019  PT End of Session - 10/16/19 1144    Visit Number  1    Date for PT Re-Evaluation  12/11/19    PT Start Time  1118   late   PT Stop Time  1145    PT Time Calculation (min)  27 min    Activity Tolerance  Patient tolerated treatment well    Behavior During Therapy  [facility] for tasks assessed/performed       Past Medical History:  Diagnosis Date  . Allergy   . Anxiety   . Arthritis    RA (Dr. Ouida Sills) Bilateral hands  . C. difficile diarrhea    history of  . CAD (coronary artery disease), native coronary artery    a. Cardiac CT with + FFR for Ramus b. cath 05/2017 100% sub acute lesion to mid circumflex artery s/p DES.  . Cancer ([place])    Right kidney CA removed.   . CKD (chronic kidney disease) stage 3, GFR 30-59 ml/min 10/11/2016   s/p R nephrectomy  . Colon polyps 2008   HYPERPLASTIC  . Costochondritis    a. Nuc Stress Test 6/16: EF 70, no scar or ischemia, low risk // b. Echo 3/16: mild conc LVH, EF 65-70, no RWMA, Gr 1 DD, mild TR  . Diabetes mellitus without complication ([place])    type 2  no meds  . Dyspnea   . Gastritis   . GERD (gastroesophageal reflux disease)   . History of echocardiogram    Echo 3/18:  Moderate LVH, EF [id number], grade 1 diastolic dysfunction, calcified aortic valve, mild MR, moderate LAE  . History of nuclear stress test    Myoview 3/18: Mod size and intensity fixed septal defect, may be artifact.Opposite mod size and intensity lat defect, which is reversible and could represent ischemia or possibly artifact (SDS 4). LVEF 71% with normal wall motion. Intermediate risk study. >> images reviewed with Dr. Dorris Carnes - no  sig ischemia; med rx   . History of pneumonia   . Hx of cardiovascular stress test    Lexiscan Myoview 6/16:  EF 70%, no scar or ischemia; Low Risk  . Hypertension   . Neuromuscular disorder ([place])    Neruopathy in bilateral feet  . Neuropathy   . Orthostatic hypotension 05/28/2017  . Osteoarthritis   . Pneumonia   . S/P angioplasty with stent 05/27/17 to LCX with DES  05/28/2017  . Thyroid disease     Past Surgical History:  Procedure Laterality Date  . ABDOMINAL HYSTERECTOMY  1978  . ANTERIOR CERVICAL DECOMP/DISCECTOMY FUSION N/A 05/29/2018   Procedure: ANTERIOR CERVICAL DECOMPRESSION FUSION - CERVICAL FIVE-CERVICAL SIX - CERVICAL SIX-CERVICAL SEVEN;  Surgeon: Earnie Larsson, MD;  Location: [facility];  Service: Neurosurgery;  Laterality: N/A;  . BACK SURGERY     x 2  . CARPAL TUNNEL RELEASE Left   . COLONOSCOPY W/ BIOPSIES AND POLYPECTOMY     Hx: of  . CORONARY STENT INTERVENTION N/A 05/27/2017   Procedure: CORONARY STENT INTERVENTION;  Surgeon: Burnell Blanks, MD;  Location: [facility];  Service: Cardiovascular;  Laterality: N/A;  . ESOPHAGOGASTRODUODENOSCOPY    . HNP    . LEFT HEART CATH AND  CORONARY ANGIOGRAPHY N/A 05/27/2017   Procedure: LEFT HEART CATH AND CORONARY ANGIOGRAPHY;  Surgeon: Burnell Blanks, MD;  Location: [facility];  Service: Cardiovascular;  Laterality: N/A;  . LUMBAR LAMINECTOMY/DECOMPRESSION MICRODISCECTOMY Left 02/12/2013   Procedure: LUMBAR TWO THREE, LUMBAR THREE FOUR, LUMBAR FOUR FIVE  LAMINECTOMY/DECOMPRESSION MICRODISCECTOMY 3 LEVELS;  Surgeon: Charlie Pitter, MD;  Location: [facility];  Service: Neurosurgery;  Laterality: Left;  . NEPHRECTOMY Right 2010   10.rcc cancer  . TOTAL KNEE ARTHROPLASTY Right    Redo    There were no vitals filed for this visit.   Subjective Assessment - 10/16/19 1119    Subjective  Pt is a Rt hand dominant female who presents to PT with Lt shoulder pain that began ~6 weeks ago without  incident or injury.  Pt has been pusing her son in a wheelchair and this might have contributed to the onset of pain.    Pertinent History  kidney cancer, HTN    Limitations  Lifting    How long can you sit comfortably?  Use of Lt UE: limited with cleaning and household tasks, reaching overhead.  Pt using Lt UE 65% of normal.    Diagnostic tests  x-ray: OA of LT shoulder.  Bone in joint or piece of cartilage    Patient Stated Goals  use arm normally, clean without increased pain, reach overhead    Currently in Pain?  Yes    Pain Score  8    up to 10/10   Pain Location  Shoulder    Pain Orientation  Left    Pain Descriptors / Indicators  Aching;Dull    Pain Type  Chronic pain    Pain Onset  More than a month ago    Pain Frequency  Constant    Aggravating Factors   reaching overhead, cleaning    Pain Relieving Factors  not using Lt UE         [facility] PT Assessment - 10/16/19 0001      Assessment   Medical Diagnosis  Impingement syndrome of the Lt shoulder    Referring Provider (PT)  [name], Betty, MD    Onset Date/Surgical Date  09/18/19    Hand Dominance  Right    Next MD Visit  April 2021    Prior Therapy  none      Precautions   Precautions  Other (comment)    Precaution Comments  history of cancer      Restrictions   Weight Bearing Restrictions  No      Balance Screen   Has the patient fallen in the past 6 months  No    Has the patient had a decrease in activity level because of a fear of falling?   No    Is the patient reluctant to leave their home because of a fear of falling?   No      Home Environment   Living Environment  Private residence    Living Arrangements  Children   adult children   Type of Home  House    Additional Comments  son at home-recent limb amputation      Prior Function   Level of Independence  Independent    Vocation  Retired    Leisure  none      Cognition   Overall Cognitive Status  Within Functional Limits for tasks assessed       Observation/Other Assessments   Focus on Therapeutic Outcomes (FOTO)  47% limitation      Posture/Postural Control   Posture/Postural Control  Postural limitations    Postural Limitations  Forward head;Rounded Shoulders;Flexed trunk      ROM / Strength   AROM / PROM / Strength  AROM;PROM;Strength      AROM   Overall AROM   Deficits    Overall AROM Comments  Rt shoulder full without pain    AROM Assessment Site  Shoulder    Right/Left Shoulder  Left    Left Shoulder Flexion  110 Degrees    Left Shoulder ABduction  108 Degrees    Left Shoulder Internal Rotation  --   to Lt buttock   Left Shoulder External Rotation  --   to C7     PROM   Overall PROM   Unable to assess    Overall PROM Comments  due to time constraints      Strength   Overall Strength  Deficits    Overall Strength Comments  Rt shoulder 5/5    Strength Assessment Site  Shoulder    Right/Left Shoulder  Left    Left Shoulder Flexion  4/5    Left Shoulder ABduction  4-/5    Left Shoulder Internal Rotation  4/5    Left Shoulder External Rotation  3-/5      Palpation   Palpation comment  palpable tenderness over Lt posterior and lateral glenohumeral joint and deltoid.  Reduced inferior glide of humerus.        Ambulation/Gait   Ambulation/Gait  No                Objective measurements completed on examination: See above findings.              PT Education - 10/16/19 1143    Education Details  Access Code: [email or web address]    Person(s) Educated  Patient    Methods  Explanation;Demonstration;Handout    Comprehension  Verbalized understanding;Returned demonstration       PT Short Term Goals - 10/16/19 1116      PT SHORT TERM GOAL #1   Title  pt to be independent with inital HEP    Time  4    Period  Weeks    Target Date  11/13/19      PT SHORT TERM GOAL #2   Title  report a 30% reduction in Lt shoulder pain with daily use    Time  4    Target Date  11/13/19      PT SHORT TERM GOAL #3    Title  report > or = to 75% use of Lt UE with home tasks    Time  4    Period  Weeks    Status  New    Target Date  11/13/19        PT Long Term Goals - 10/16/19 1228      PT LONG TERM GOAL #1   Title  be indpendent in advanced HEP    Time  8    Period  Weeks    Status  New    Target Date  12/11/19      PT LONG TERM GOAL #2   Title  demonstrate Rt shoulder A/ROM flexion to > or = to 140 degrees to improve overhead reaching    Time  8    Period  Weeks    Status  New    Target Date  12/11/19      PT LONG TERM  GOAL #3   Title  reduce FOTO to < to 34% limitation    Time  8    Period  Weeks    Status  New    Target Date  12/11/19      PT LONG TERM GOAL #4   Title  improve Lt shoulder strength to > or = to 4+/5 throughout to improve functional endurance with use    Time  8    Period  Weeks    Status  New    Target Date  12/11/19      PT LONG TERM GOAL #5   Title  report < or = to 3-4/10 Lt shoulder pain with reaching overhead or cleaning    Time  8    Period  Weeks    Status  New    Target Date  12/11/19             Plan - 10/16/19 1224    Clinical Impression Statement  Pt presents to PT with Lt shoulder pain that began 6 weeks ago without cause.  Pt had x-ray that showed OA in the joint and a bone or cartilage fragment.  Pt had injection in MD office with some improvement.  Pt reports constant 8/10 Lt shoulder pain at rest and up to 10/10 with reaching, lifting and use of the Lt UE.  Pt reports 65% use of the Lt UE limited by pain.  Pt demonstrates limited Rt shoulder A/ROM with pain at end range and limited strength throughout.  Pt with forward head and bil scapular retraction.  Pt will benefit from skilled PT to address Lt shoulder pain, limited strength and A/ROM needed for functional use.    Personal Factors and Comorbidities  Comorbidity 2    Comorbidities  HTN, neuropathy, history of kidney cancer    Examination-Activity Limitations  Caring for  Others;Lift;Reach Overhead    Examination-Participation Restrictions  Cleaning    Stability/Clinical Decision Making  Stable/Uncomplicated    Clinical Decision Making  Low    Rehab Potential  Excellent    PT Frequency  2x / week    PT Duration  8 weeks    PT Treatment/Interventions  ADLs/Self Care Home Management;Cryotherapy;Electrical Stimulation;Moist Heat;Iontophoresis 4mg /ml Dexamethasone;Therapeutic activities;Therapeutic exercise;Neuromuscular re-education;Manual techniques;Passive range of motion;Dry needling;Taping    PT Next Visit Plan  Lt shoulder ROM, postural strength, manual    PT Home Exercise Plan  Access Code: [email or web address]    Consulted and Agree with Plan of Care  Patient       Patient will benefit from skilled therapeutic intervention in order to improve the following deficits and impairments:  Abnormal gait, Decreased activity tolerance, Decreased balance, Decreased mobility, Decreased strength, Postural dysfunction, Improper body mechanics, Impaired flexibility, Pain, Impaired UE functional use, Increased muscle spasms, Decreased range of motion  Visit Diagnosis: Chronic left shoulder pain - Plan: PT plan of care cert/re-cert  Stiffness of left shoulder, not elsewhere classified - Plan: PT plan of care cert/re-cert  Abnormal posture - Plan: PT plan of care cert/re-cert  Cramp and spasm - Plan: PT plan of care cert/re-cert     Problem List Patient Active Problem List   Diagnosis Date Noted  . Malignant neoplasm of kidney excluding renal pelvis, unspecified laterality ([place]) 09/26/2019  . Vitamin D deficiency, unspecified 09/26/2019  . Anxiety disorder, unspecified 03/26/2019  . Tobacco use disorder 09/29/2018  . Cervical myelopathy ([place]) 05/29/2018  . Cervicalgia 09/16/2017  . Orthostatic hypotension 05/28/2017  . S/P angioplasty with stent  05/27/17 to LCX with DES  05/28/2017  . CAD (coronary artery disease)   . Polyneuropathy 05/25/2017  . Chest pain with  moderate risk for cardiac etiology 05/25/2017  . Class 1 obesity with serious comorbidity and body mass index (BMI) of 34.0 to 34.9 in adult 12/06/2016  . CKD (chronic kidney disease) stage 3, GFR 30-59 ml/min 10/11/2016  . Hot flashes, menopausal 10/01/2016  . Chest pain with moderate risk of acute coronary syndrome 10/25/2014  . Degenerative spondylolisthesis 10/15/2014  . Spinal stenosis, lumbar region, with neurogenic claudication 02/12/2013  . DM (diabetes mellitus), type 2 with renal complications ([place]) [id number]  . History of Rtr nephrectomy 2010, secondary to renal cell cancer 01/14/2009  . PERSONAL HX COLONIC POLYPS 11/14/2008  . Hyperlipidemia associated with type 2 diabetes mellitus ([place]) 11/16/2007  . ESOPHAGEAL STRICTURE 10/12/2007  . Rheumatoid arthritis ([place]) 02/27/2007  . Hypertension with heart disease 01/19/2007  . Allergic rhinitis 01/19/2007  . GERD 01/19/2007     Sigurd Sos, PT 10/16/19 12:32 PM  [facility] [address], [place], [place], [address] Phone: [phone]   Fax:  [phone]  Name: [name] MRN: [id number] Date of Birth: 05-Jun-1946

## 2019-10-16 NOTE — Patient Instructions (Signed)
Access Code: [email or web address] URL: [email or web address] Date: 10/16/2019 Prepared by: Claiborne Billings  Exercises Seated Cervical Sidebending AROM - 3 x daily - 7 x weekly - 3 reps - 1 sets - 20 hold Seated Correct Posture - 1 x daily - 7 x weekly - 10 reps - 3 sets Seated Scapular Retraction - 5 x daily - 7 x weekly - 2 sets - 10 reps - 5 hold Supine Shoulder Flexion PROM - 3 x daily - 7 x weekly - 1 sets - 10 reps - 10 hold

## 2019-10-21 ENCOUNTER — Other Ambulatory Visit: Payer: Self-pay

## 2019-10-21 ENCOUNTER — Encounter (HOSPITAL_COMMUNITY): Payer: Self-pay | Admitting: Emergency Medicine

## 2019-10-21 ENCOUNTER — Ambulatory Visit (HOSPITAL_COMMUNITY)
Admission: EM | Admit: 2019-10-21 | Discharge: 2019-10-21 | Disposition: A | Discharge status: Home / Self Care | Payer: Medicare HMO | Attending: Emergency Medicine | Admitting: Emergency Medicine

## 2019-10-21 ENCOUNTER — Ambulatory Visit (INDEPENDENT_AMBULATORY_CARE_PROVIDER_SITE_OTHER): Payer: Medicare HMO

## 2019-10-21 DIAGNOSIS — Z885 Allergy status to narcotic agent status: Secondary | ICD-10-CM | POA: Diagnosis not present

## 2019-10-21 DIAGNOSIS — Z833 Family history of diabetes mellitus: Secondary | ICD-10-CM | POA: Insufficient documentation

## 2019-10-21 DIAGNOSIS — K219 Gastro-esophageal reflux disease without esophagitis: Secondary | ICD-10-CM | POA: Diagnosis not present

## 2019-10-21 DIAGNOSIS — N183 Chronic kidney disease, stage 3 unspecified: Secondary | ICD-10-CM | POA: Insufficient documentation

## 2019-10-21 DIAGNOSIS — Z20822 Contact with and (suspected) exposure to covid-19: Secondary | ICD-10-CM | POA: Insufficient documentation

## 2019-10-21 DIAGNOSIS — Z905 Acquired absence of kidney: Secondary | ICD-10-CM | POA: Insufficient documentation

## 2019-10-21 DIAGNOSIS — E1122 Type 2 diabetes mellitus with diabetic chronic kidney disease: Secondary | ICD-10-CM | POA: Diagnosis not present

## 2019-10-21 DIAGNOSIS — Z85528 Personal history of other malignant neoplasm of kidney: Secondary | ICD-10-CM | POA: Diagnosis not present

## 2019-10-21 DIAGNOSIS — I2510 Atherosclerotic heart disease of native coronary artery without angina pectoris: Secondary | ICD-10-CM | POA: Diagnosis not present

## 2019-10-21 DIAGNOSIS — Z791 Long term (current) use of non-steroidal anti-inflammatories (NSAID): Secondary | ICD-10-CM | POA: Insufficient documentation

## 2019-10-21 DIAGNOSIS — M069 Rheumatoid arthritis, unspecified: Secondary | ICD-10-CM | POA: Insufficient documentation

## 2019-10-21 DIAGNOSIS — Z7982 Long term (current) use of aspirin: Secondary | ICD-10-CM | POA: Diagnosis not present

## 2019-10-21 DIAGNOSIS — J0100 Acute maxillary sinusitis, unspecified: Secondary | ICD-10-CM | POA: Insufficient documentation

## 2019-10-21 DIAGNOSIS — E785 Hyperlipidemia, unspecified: Secondary | ICD-10-CM | POA: Insufficient documentation

## 2019-10-21 DIAGNOSIS — Z886 Allergy status to analgesic agent status: Secondary | ICD-10-CM | POA: Diagnosis not present

## 2019-10-21 DIAGNOSIS — Z79899 Other long term (current) drug therapy: Secondary | ICD-10-CM | POA: Diagnosis not present

## 2019-10-21 DIAGNOSIS — F17210 Nicotine dependence, cigarettes, uncomplicated: Secondary | ICD-10-CM | POA: Diagnosis not present

## 2019-10-21 DIAGNOSIS — R519 Headache, unspecified: Secondary | ICD-10-CM | POA: Diagnosis present

## 2019-10-21 DIAGNOSIS — I129 Hypertensive chronic kidney disease with stage 1 through stage 4 chronic kidney disease, or unspecified chronic kidney disease: Secondary | ICD-10-CM | POA: Diagnosis not present

## 2019-10-21 DIAGNOSIS — E079 Disorder of thyroid, unspecified: Secondary | ICD-10-CM | POA: Diagnosis not present

## 2019-10-21 DIAGNOSIS — Z8249 Family history of ischemic heart disease and other diseases of the circulatory system: Secondary | ICD-10-CM | POA: Insufficient documentation

## 2019-10-21 DIAGNOSIS — R05 Cough: Secondary | ICD-10-CM | POA: Diagnosis not present

## 2019-10-21 DIAGNOSIS — Z955 Presence of coronary angioplasty implant and graft: Secondary | ICD-10-CM | POA: Insufficient documentation

## 2019-10-21 LAB — SARS CORONAVIRUS 2 (TAT 6-24 HRS): SARS Coronavirus 2: NEGATIVE

## 2019-10-21 MED ORDER — DOXYCYCLINE HYCLATE 100 MG PO CAPS: 100.0000 mg | ORAL_CAPSULE | Freq: Two times a day (BID) | ORAL | 0 refills | Status: DC

## 2019-10-21 MED ORDER — ALBUTEROL SULFATE HFA 108 (90 BASE) MCG/ACT IN AERS: 1.0000 | INHALATION_SPRAY | Freq: Four times a day (QID) | RESPIRATORY_TRACT | 0 refills | Status: DC | PRN

## 2019-10-21 NOTE — Discharge Instructions (Addendum)
Your xray is overall reassuring, I suspect any changes I am seeing are related to smoking. Continue to decrease to quit smoking.  We will try a course of antibiotics for concern for sinus infection.  Use of inhaler as needed for wheezing or shortness of breath.   Rest.  Fluids.  Tylenol as needed.  If symptoms worsen or do not improve in the next week to return to be seen or to follow up with your PCP.  We will call you if your covid test is positive, negative results are sent through on your MyChart.

## 2019-10-21 NOTE — ED Provider Notes (Signed)
[name]    CSN: [id number] Arrival date & time: 10/21/19  1331      History   Chief Complaint Chief Complaint  Patient presents with  . Headache  . Fatigue  . Facial Pain    HPI [name] is a 74 y.o. female.   [name] presents with complaints of two weeks of not feeling well. States that just prior to onset of symptoms she received her 2nd covid vaccine. Headache's. "dizzy-like" headache at times. No nasal drainage. Facial pressure, under eyes. Occasionally will cough, and cough is productive of white phlegm. Very fatigued. Some shortness of breath with activity. No chest pain. No ear pain, no sore throat. No rash. No gi symptoms. Has been taking claritin, uses regular flonase, and took tylenol. These have not necessarily helped with symptoms. Yesterday felt cold, and "didn't feel well."  No known ill contacts. Has had sinus infections in the past and this feels similar. History of pneumonia years ago although states this doesn't feel similar to past episod of pneumonia. She does smoke cigarettes. No previous inhaler use.  History  Of CAD, CKD and kidney cancer, DM, htn.     ROS per HPI, negative if not otherwise mentioned.      Past Medical History:  Diagnosis Date  . Allergy   . Anxiety   . Arthritis    RA (Dr. Ouida Sills) Bilateral hands  . C. difficile diarrhea    history of  . CAD (coronary artery disease), native coronary artery    a. Cardiac CT with + FFR for Ramus b. cath 05/2017 100% sub acute lesion to mid circumflex artery s/p DES.  . Cancer ([place])    Right kidney CA removed.   . CKD (chronic kidney disease) stage 3, GFR 30-59 ml/min 10/11/2016   s/p R nephrectomy  . Colon polyps 2008   HYPERPLASTIC  . Costochondritis    a. Nuc Stress Test 6/16: EF 70, no scar or ischemia, low risk // b. Echo 3/16: mild conc LVH, EF 65-70, no RWMA, Gr 1 DD, mild TR  . Diabetes mellitus without complication ([place])    type 2  no meds  . Dyspnea   .  Gastritis   . GERD (gastroesophageal reflux disease)   . History of echocardiogram    Echo 3/18:  Moderate LVH, EF [id number], grade 1 diastolic dysfunction, calcified aortic valve, mild MR, moderate LAE  . History of nuclear stress test    Myoview 3/18: Mod size and intensity fixed septal defect, may be artifact.Opposite mod size and intensity lat defect, which is reversible and could represent ischemia or possibly artifact (SDS 4). LVEF 71% with normal wall motion. Intermediate risk study. >> images reviewed with Dr. Dorris Carnes - no sig ischemia; med rx   . History of pneumonia   . Hx of cardiovascular stress test    Lexiscan Myoview 6/16:  EF 70%, no scar or ischemia; Low Risk  . Hypertension   . Neuromuscular disorder ([place])    Neruopathy in bilateral feet  . Neuropathy   . Orthostatic hypotension 05/28/2017  . Osteoarthritis   . Pneumonia   . S/P angioplasty with stent 05/27/17 to LCX with DES  05/28/2017  . Thyroid disease     Patient Active Problem List   Diagnosis Date Noted  . Malignant neoplasm of kidney excluding renal pelvis, unspecified laterality ([place]) 09/26/2019  . Vitamin D deficiency, unspecified 09/26/2019  . Anxiety disorder, unspecified 03/26/2019  . Tobacco use disorder  09/29/2018  . Cervical myelopathy ([place]) 05/29/2018  . Cervicalgia 09/16/2017  . Orthostatic hypotension 05/28/2017  . S/P angioplasty with stent 05/27/17 to LCX with DES  05/28/2017  . CAD (coronary artery disease)   . Polyneuropathy 05/25/2017  . Chest pain with moderate risk for cardiac etiology 05/25/2017  . Class 1 obesity with serious comorbidity and body mass index (BMI) of 34.0 to 34.9 in adult 12/06/2016  . CKD (chronic kidney disease) stage 3, GFR 30-59 ml/min 10/11/2016  . Hot flashes, menopausal 10/01/2016  . Chest pain with moderate risk of acute coronary syndrome 10/25/2014  . Degenerative spondylolisthesis 10/15/2014  . Spinal stenosis, lumbar region, with neurogenic claudication  02/12/2013  . DM (diabetes mellitus), type 2 with renal complications ([place]) 123XX123  . History of Rtr nephrectomy 2010, secondary to renal cell cancer 01/14/2009  . PERSONAL HX COLONIC POLYPS 11/14/2008  . Hyperlipidemia associated with type 2 diabetes mellitus ([place]) 11/16/2007  . ESOPHAGEAL STRICTURE 10/12/2007  . Rheumatoid arthritis ([place]) 02/27/2007  . Hypertension with heart disease 01/19/2007  . Allergic rhinitis 01/19/2007  . GERD 01/19/2007    Past Surgical History:  Procedure Laterality Date  . ABDOMINAL HYSTERECTOMY  1978  . ANTERIOR CERVICAL DECOMP/DISCECTOMY FUSION N/A 05/29/2018   Procedure: ANTERIOR CERVICAL DECOMPRESSION FUSION - CERVICAL FIVE-CERVICAL SIX - CERVICAL SIX-CERVICAL SEVEN;  Surgeon: Earnie Larsson, MD;  Location: [facility];  Service: Neurosurgery;  Laterality: N/A;  . BACK SURGERY     x 2  . CARPAL TUNNEL RELEASE Left   . COLONOSCOPY W/ BIOPSIES AND POLYPECTOMY     Hx: of  . CORONARY STENT INTERVENTION N/A 05/27/2017   Procedure: CORONARY STENT INTERVENTION;  Surgeon: Burnell Blanks, MD;  Location: [facility];  Service: Cardiovascular;  Laterality: N/A;  . ESOPHAGOGASTRODUODENOSCOPY    . HNP    . LEFT HEART CATH AND CORONARY ANGIOGRAPHY N/A 05/27/2017   Procedure: LEFT HEART CATH AND CORONARY ANGIOGRAPHY;  Surgeon: Burnell Blanks, MD;  Location: [facility];  Service: Cardiovascular;  Laterality: N/A;  . LUMBAR LAMINECTOMY/DECOMPRESSION MICRODISCECTOMY Left 02/12/2013   Procedure: LUMBAR TWO THREE, LUMBAR THREE FOUR, LUMBAR FOUR FIVE  LAMINECTOMY/DECOMPRESSION MICRODISCECTOMY 3 LEVELS;  Surgeon: Charlie Pitter, MD;  Location: [facility];  Service: Neurosurgery;  Laterality: Left;  . NEPHRECTOMY Right 2010   10.rcc cancer  . TOTAL KNEE ARTHROPLASTY Right    Redo    OB History   No obstetric history on file.      Home Medications    Prior to Admission medications   Medication Sig Start Date End Date Taking? Authorizing  Provider  acetaminophen (TYLENOL) 500 MG tablet Take 1,000 mg by mouth as needed for moderate pain or headache.    Yes [provider]  aspirin 81 MG chewable tablet Chew 81 mg by mouth daily.   Yes [provider]  clopidogrel (PLAVIX) 75 MG tablet TAKE ONE TABLET BY MOUTH ONE TIME DAILY  09/27/18  Yes Richardson Dopp T, PA-C  colchicine 0.6 MG tablet Take 0.5 tablets (0.3 mg total) by mouth daily. 06/19/19  Yes Deveshwar, Abel Presto, MD  DULoxetine (CYMBALTA) 30 MG capsule Take 1 capsule (30 mg total) by mouth daily. 09/26/19  Yes [name], [name], MD  famotidine (PEPCID) 20 MG tablet TAKE 1 TABLET BY MOUTH TWICE DAILY AS NEEDED FOR HEARTBURN OR INDIGESTION 09/25/19  Yes [name], [name], MD  fluticasone (FLONASE) 50 MCG/ACT nasal spray PLACE 2 SPRAYS INTO BOTH NOSTRILS AT BEDTIME AS NEEDED FOR ALLERGIES. Patient  taking differently: Place 2 sprays into both nostrils at bedtime.  07/25/19  Yes [name], [name], MD  gabapentin (NEURONTIN) 100 MG capsule Take 1 capsule (100 mg total) by mouth 3 (three) times daily. 08/30/19  Yes Ward Givens, NP  gabapentin (NEURONTIN) 400 MG capsule Take 1 capsule (400 mg total) by mouth 2 (two) times daily. 11/21/18  Yes Penumalli, Earlean Polka, MD  losartan (COZAAR) 100 MG tablet Take 1 tablet (100 mg total) by mouth daily. 05/30/19  Yes [name], [name], MD  metoprolol tartrate (LOPRESSOR) 50 MG tablet Take 1 tablet (50 mg total) by mouth 2 (two) times daily. 05/30/19  Yes [name], [name], MD  rosuvastatin (CRESTOR) 40 MG tablet Take 1 tablet (40 mg total) by mouth daily. 09/14/18  Yes Daune Perch, NP  triamterene-hydrochlorothiazide (MAXZIDE-25) 37.5-25 MG tablet Take 0.5 tablets by mouth daily. 07/09/19  Yes Fay Records, MD  Vitamin D, Ergocalciferol, (DRISDOL) 1.25 MG (50000 UNIT) CAPS capsule Take 1 capsule by mouth every week x 8 weeks, and then every 2 weeks. 10/03/19  Yes [name], [name], MD  albuterol [facility] HFA) [phone] Base) MCG/ACT inhaler Inhale  1-2 puffs into the lungs every 6 (six) hours as needed for wheezing or shortness of breath. 10/21/19   Augusto Gamble B, NP  diclofenac sodium (VOLTAREN) 1 % GEL Apply 2 g topically as needed (for arm pain).     [provider]  doxycycline (VIBRAMYCIN) 100 MG capsule Take 1 capsule (100 mg total) by mouth 2 (two) times daily. 10/21/19   Zigmund Gottron, NP  Multiple Vitamin (MULTIVITAMIN) tablet Take 1 tablet by mouth daily.    [provider]  nitroGLYCERIN (NITROSTAT) 0.4 MG SL tablet Place 1 tablet (0.4 mg total) under the tongue every 5 (five) minutes as needed for chest pain. 07/09/19   Fay Records, MD  polyethylene glycol powder (GLYCOLAX/MIRALAX) powder Take 17 g by mouth daily. Patient taking differently: Take 17 g by mouth every other day.  05/03/17   Levin Erp, PA    Family History Family History  Problem Relation Age of Onset  . Rheum arthritis Mother   . Stroke Mother   . Prostate cancer Father   . Heart disease Father   . Diabetes Brother   . Dementia Brother   . Parkinsonism Brother   . Kidney disease Son   . Diabetes Brother   . Cancer Brother   . Dementia Brother   . Colon cancer Neg Hx   . Esophageal cancer Neg Hx   . Pancreatic cancer Neg Hx   . Liver disease Neg Hx     Social History Social History   Tobacco Use  . Smoking status: Current Every Day Smoker    Packs/day: 0.20    Years: 15.00    Pack years: 3.00    Types: Cigarettes  . Smokeless tobacco: Never Used  . Tobacco comment: 03/16/17  1 pk/week  Substance Use Topics  . Alcohol use: No    Alcohol/week: 0.0 standard drinks  . Drug use: No     Allergies   Oxycodone-acetaminophen, Percocet [oxycodone-acetaminophen], Pravastatin sodium, Hydrocodone, and Aspirin   Review of Systems Review of Systems   Physical Exam Triage Vital Signs ED Triage Vitals  Enc Vitals Group     BP 10/21/19 1353 123/73     Pulse Rate 10/21/19 1353 67     Resp 10/21/19 1353 18      Temp 10/21/19 1353 97.7 F (36.5 C)  Temp Source 10/21/19 1353 Oral     SpO2 10/21/19 1353 97 %     Weight --      Height --      Head Circumference --      Peak Flow --      Pain Score 10/21/19 1349 8     Pain Loc --      Pain Edu? --      Excl. in [place]? --    No data found.  Updated Vital Signs BP 123/73 (BP Location: Left Arm)   Pulse 67   Temp 97.7 F (36.5 C) (Oral)   Resp 18   SpO2 97%    Physical Exam Constitutional:      General: She is not in acute distress.    Appearance: She is well-developed.  HENT:     Nose:     Right Sinus: Maxillary sinus tenderness present.     Left Sinus: Maxillary sinus tenderness present.  Cardiovascular:     Rate and Rhythm: Normal rate.  Pulmonary:     Effort: Pulmonary effort is normal.     Comments: Scattered faint crackles noted throughout; no increased work of breathing or visible shortness of breath however Skin:    General: Skin is warm and dry.  Neurological:     Mental Status: She is alert and oriented to person, place, and time.      UC Treatments / Results  Labs (all labs ordered are listed, but only abnormal results are displayed) Labs Reviewed  SARS CORONAVIRUS 2 (TAT 6-24 HRS)    EKG   Radiology DG Chest 2 View  Result Date: 10/21/2019 CLINICAL DATA:  sinus pressure/pain, productive cough white sputum, and general malaise for approx 2 weeks since receiving "my second COVID shot". Current everyday smoker EXAM: CHEST - 2 VIEW COMPARISON:  Chest radiograph 05/25/2017 FINDINGS: The heart size and mediastinal contours are within normal limits. There is mild diffuse coarsening of the interstitium bilaterally likely chronic bronchitic change. Scattered bibasilar linear opacities likely reflect atelectasis or scarring. No pneumothorax or pleural effusion. No focal consolidation. Multilevel degenerative changes are noted in the thoracic spine. IMPRESSION: Likely chronic bronchitic change and scattered atelectasis.  Electronically Signed   By: Audie Pinto M.D.   On: 10/21/2019 14:49    Procedures Procedures (including critical care time)  Medications Ordered in UC Medications - No data to display  Initial Impression / Assessment and Plan / UC Course  I have reviewed the triage vital signs and the nursing notes.  Pertinent labs & imaging results that were available during my care of the patient were reviewed by me and considered in my medical decision making (see chart for details).     Non toxic. No work of breathing. Afebrile. No tachycardia or tachypnea. No hypoxia. Fatigue, sinus pressure, some productive cough. Opted to cover for sinusitis with doxycycline at this time, xray without acute findings today. Continue with supportive cares. Inhaler for prn use. covid testing pending although has received both vaccinations. Return precautions provided. If symptoms worsen or do not improve in the next 2 weeks to return to be seen or to follow up with PCP.  Patient verbalized understanding and agreeable to plan.   Final Clinical Impressions(s) / UC Diagnoses   Final diagnoses:  Acute maxillary sinusitis, recurrence not specified     Discharge Instructions     Your xray is overall reassuring, I suspect any changes I am seeing are related to smoking. Continue to decrease to quit  smoking.  We will try a course of antibiotics for concern for sinus infection.  Use of inhaler as needed for wheezing or shortness of breath.   Rest.  Fluids.  Tylenol as needed.  If symptoms worsen or do not improve in the next week to return to be seen or to follow up with your PCP.  We will call you if your covid test is positive, negative results are sent through on your MyChart.      ED Prescriptions    Medication Sig Dispense Auth. Provider   doxycycline (VIBRAMYCIN) 100 MG capsule  (Status: Discontinued) Take 1 capsule (100 mg total) by mouth 2 (two) times daily. 20 capsule Augusto Gamble B, NP   albuterol  (PROAIR HFA) 108 (90 Base) MCG/ACT inhaler  (Status: Discontinued) Inhale 1-2 puffs into the lungs every 6 (six) hours as needed for wheezing or shortness of breath. 8 g Augusto Gamble B, NP   albuterol (PROAIR HFA) 108 (90 Base) MCG/ACT inhaler Inhale 1-2 puffs into the lungs every 6 (six) hours as needed for wheezing or shortness of breath. 8 g Augusto Gamble B, NP   doxycycline (VIBRAMYCIN) 100 MG capsule Take 1 capsule (100 mg total) by mouth 2 (two) times daily. 20 capsule Zigmund Gottron, NP     PDMP not reviewed this encounter.   Zigmund Gottron, NP 10/21/19 1502

## 2019-10-21 NOTE — ED Triage Notes (Signed)
Pt c/o sinus pressure/pain, productive cough white sputum, and general malaise for approx 2 weeks since receiving "my second COVID shot".  C/o HA onset approx 3 days ago.  Denies fever, n/v/d, abdom pain.

## 2019-11-07 ENCOUNTER — Other Ambulatory Visit: Payer: Self-pay

## 2019-11-07 ENCOUNTER — Ambulatory Visit: Payer: Medicare HMO | Attending: Family Medicine

## 2019-11-07 DIAGNOSIS — M25612 Stiffness of left shoulder, not elsewhere classified: Secondary | ICD-10-CM | POA: Diagnosis not present

## 2019-11-07 DIAGNOSIS — M25512 Pain in left shoulder: Secondary | ICD-10-CM | POA: Insufficient documentation

## 2019-11-07 DIAGNOSIS — R293 Abnormal posture: Secondary | ICD-10-CM

## 2019-11-07 DIAGNOSIS — R252 Cramp and spasm: Secondary | ICD-10-CM | POA: Diagnosis not present

## 2019-11-07 DIAGNOSIS — G8929 Other chronic pain: Secondary | ICD-10-CM | POA: Insufficient documentation

## 2019-11-07 NOTE — Therapy (Signed)
[facility] [address], [place], [place], [address] Phone: [phone]   Fax:  [phone]  Physical Therapy Treatment  Patient Details  Name: [name] MRN: [id number] Date of Birth: 01/21/1947 Referring Provider (PT): [name], Betty, MD   Encounter Date: 11/07/2019  PT End of Session - 11/07/19 1233    Visit Number  2    Date for PT Re-Evaluation  12/11/19    PT Start Time  1150    PT Stop Time  1231    PT Time Calculation (min)  41 min    Activity Tolerance  Patient tolerated treatment well    Behavior During Therapy  [facility] for tasks assessed/performed       Past Medical History:  Diagnosis Date  . Allergy   . Anxiety   . Arthritis    RA (Dr. Ouida Sills) Bilateral hands  . C. difficile diarrhea    history of  . CAD (coronary artery disease), native coronary artery    a. Cardiac CT with + FFR for Ramus b. cath 05/2017 100% sub acute lesion to mid circumflex artery s/p DES.  . Cancer ([place])    Right kidney CA removed.   . CKD (chronic kidney disease) stage 3, GFR 30-59 ml/min 10/11/2016   s/p R nephrectomy  . Colon polyps 2008   HYPERPLASTIC  . Costochondritis    a. Nuc Stress Test 6/16: EF 70, no scar or ischemia, low risk // b. Echo 3/16: mild conc LVH, EF 65-70, no RWMA, Gr 1 DD, mild TR  . Diabetes mellitus without complication ([place])    type 2  no meds  . Dyspnea   . Gastritis   . GERD (gastroesophageal reflux disease)   . History of echocardiogram    Echo 3/18:  Moderate LVH, EF [id number], grade 1 diastolic dysfunction, calcified aortic valve, mild MR, moderate LAE  . History of nuclear stress test    Myoview 3/18: Mod size and intensity fixed septal defect, may be artifact.Opposite mod size and intensity lat defect, which is reversible and could represent ischemia or possibly artifact (SDS 4). LVEF 71% with normal wall motion. Intermediate risk study. >> images reviewed with Dr. Dorris Carnes - no sig  ischemia; med rx   . History of pneumonia   . Hx of cardiovascular stress test    Lexiscan Myoview 6/16:  EF 70%, no scar or ischemia; Low Risk  . Hypertension   . Neuromuscular disorder ([place])    Neruopathy in bilateral feet  . Neuropathy   . Orthostatic hypotension 05/28/2017  . Osteoarthritis   . Pneumonia   . S/P angioplasty with stent 05/27/17 to LCX with DES  05/28/2017  . Thyroid disease     Past Surgical History:  Procedure Laterality Date  . ABDOMINAL HYSTERECTOMY  1978  . ANTERIOR CERVICAL DECOMP/DISCECTOMY FUSION N/A 05/29/2018   Procedure: ANTERIOR CERVICAL DECOMPRESSION FUSION - CERVICAL FIVE-CERVICAL SIX - CERVICAL SIX-CERVICAL SEVEN;  Surgeon: Earnie Larsson, MD;  Location: [facility];  Service: Neurosurgery;  Laterality: N/A;  . BACK SURGERY     x 2  . CARPAL TUNNEL RELEASE Left   . COLONOSCOPY W/ BIOPSIES AND POLYPECTOMY     Hx: of  . CORONARY STENT INTERVENTION N/A 05/27/2017   Procedure: CORONARY STENT INTERVENTION;  Surgeon: Burnell Blanks, MD;  Location: [facility];  Service: Cardiovascular;  Laterality: N/A;  . ESOPHAGOGASTRODUODENOSCOPY    . HNP    . LEFT HEART CATH AND CORONARY ANGIOGRAPHY  N/A 05/27/2017   Procedure: LEFT HEART CATH AND CORONARY ANGIOGRAPHY;  Surgeon: Burnell Blanks, MD;  Location: [facility];  Service: Cardiovascular;  Laterality: N/A;  . LUMBAR LAMINECTOMY/DECOMPRESSION MICRODISCECTOMY Left 02/12/2013   Procedure: LUMBAR TWO THREE, LUMBAR THREE FOUR, LUMBAR FOUR FIVE  LAMINECTOMY/DECOMPRESSION MICRODISCECTOMY 3 LEVELS;  Surgeon: Charlie Pitter, MD;  Location: [place] [facility] [facility];  Service: Neurosurgery;  Laterality: Left;  . NEPHRECTOMY Right 2010   10.rcc cancer  . TOTAL KNEE ARTHROPLASTY Right    Redo    There were no vitals filed for this visit.  Subjective Assessment - 11/07/19 1155    Subjective  Lapse in treatment due to Covid vaccine and illness.  My shoulder pain is coming and going.    Currently in Pain?   Yes    Pain Score  7     Pain Location  Shoulder    Pain Orientation  Left    Pain Descriptors / Indicators  Aching    Pain Type  Acute pain    Pain Onset  More than a month ago    Pain Frequency  Constant    Aggravating Factors   reaching overhead, cleaning    Pain Relieving Factors  not using Lt UE                       [facility] Adult PT Treatment/Exercise - 11/07/19 0001      Exercises   Exercises  Shoulder      Shoulder Exercises: Supine   External Rotation  Strengthening;Both;20 reps    Theraband Level (Shoulder External Rotation)  Level 1 (Yellow)    Flexion  AAROM;Both;10 reps    Flexion Limitations  supine with clasped hands    Other Supine Exercises  chest press 2x10      Shoulder Exercises: Seated   Other Seated Exercises  scap squeezes x 10      Shoulder Exercises: Standing   Flexion  AAROM;Left;10 reps    Flexion Limitations  using finger ladder      Shoulder Exercises: Pulleys   Flexion  3 minutes      Shoulder Exercises: Stretch   Corner Stretch  3 reps;10 seconds               PT Short Term Goals - 11/07/19 1157      PT SHORT TERM GOAL #1   Title  pt to be independent with inital HEP    Baseline  just getting consistent    Time  4    Period  Weeks    Status  On-going      PT SHORT TERM GOAL #2   Title  report a 30% reduction in Lt shoulder pain with daily use    Baseline  10% better    Time  4    Period  Weeks    Status  On-going        PT Long Term Goals - 10/16/19 1228      PT LONG TERM GOAL #1   Title  be indpendent in advanced HEP    Time  8    Period  Weeks    Status  New    Target Date  12/11/19      PT LONG TERM GOAL #2   Title  demonstrate Rt shoulder A/ROM flexion to > or = to 140 degrees to improve overhead reaching    Time  8    Period  Weeks  Status  New    Target Date  12/11/19      PT LONG TERM GOAL #3   Title  reduce FOTO to < to 34% limitation    Time  8    Period  Weeks    Status  New     Target Date  12/11/19      PT LONG TERM GOAL #4   Title  improve Lt shoulder strength to > or = to 4+/5 throughout to improve functional endurance with use    Time  8    Period  Weeks    Status  New    Target Date  12/11/19      PT LONG TERM GOAL #5   Title  report < or = to 3-4/10 Lt shoulder pain with reaching overhead or cleaning    Time  8    Period  Weeks    Status  New    Target Date  12/11/19            Plan - 11/07/19 1205    Clinical Impression Statement  Pt with first time follow-up after evaluation.  Pt has not been doing exercises as she lost her paper and just found it.  Pt reports 10% reduction in Lt shoulder pain since last session 3 weeks ago.  Pt has been working to make postural corrections at home.  Pt required tactile and verbal cues to reduce scapular elevation with scapular squeezes.  Pt will continue to benefit from skilled PT to address Lt shoulder pain, increased shoulder ROM and postural strength.    PT Frequency  2x / week    PT Duration  8 weeks    PT Treatment/Interventions  ADLs/Self Care Home Management;Cryotherapy;Electrical Stimulation;Moist Heat;Iontophoresis 4mg /ml Dexamethasone;Therapeutic activities;Therapeutic exercise;Neuromuscular re-education;Manual techniques;Passive range of motion;Dry needling;Taping    PT Next Visit Plan  Lt shoulder ROM, postural strength, manual    PT Home Exercise Plan  Access Code: [email or web address]    Recommended Other Services  initial certification is signed    Consulted and Agree with Plan of Care  Patient       Patient will benefit from skilled therapeutic intervention in order to improve the following deficits and impairments:  Abnormal gait, Decreased activity tolerance, Decreased balance, Decreased mobility, Decreased strength, Postural dysfunction, Improper body mechanics, Impaired flexibility, Pain, Impaired UE functional use, Increased muscle spasms, Decreased range of motion  Visit Diagnosis: Chronic left  shoulder pain  Stiffness of left shoulder, not elsewhere classified  Abnormal posture  Cramp and spasm     Problem List Patient Active Problem List   Diagnosis Date Noted  . Malignant neoplasm of kidney excluding renal pelvis, unspecified laterality ([place]) 09/26/2019  . Vitamin D deficiency, unspecified 09/26/2019  . Anxiety disorder, unspecified 03/26/2019  . Tobacco use disorder 09/29/2018  . Cervical myelopathy ([place]) 05/29/2018  . Cervicalgia 09/16/2017  . Orthostatic hypotension 05/28/2017  . S/P angioplasty with stent 05/27/17 to LCX with DES  05/28/2017  . CAD (coronary artery disease)   . Polyneuropathy 05/25/2017  . Chest pain with moderate risk for cardiac etiology 05/25/2017  . Class 1 obesity with serious comorbidity and body mass index (BMI) of 34.0 to 34.9 in adult 12/06/2016  . CKD (chronic kidney disease) stage 3, GFR 30-59 ml/min 10/11/2016  . Hot flashes, menopausal 10/01/2016  . Chest pain with moderate risk of acute coronary syndrome 10/25/2014  . Degenerative spondylolisthesis 10/15/2014  . Spinal stenosis, lumbar region, with neurogenic claudication 02/12/2013  . DM (diabetes  mellitus), type 2 with renal complications ([place]) 123XX123  . History of Rtr nephrectomy 2010, secondary to renal cell cancer 01/14/2009  . PERSONAL HX COLONIC POLYPS 11/14/2008  . Hyperlipidemia associated with type 2 diabetes mellitus ([place]) 11/16/2007  . ESOPHAGEAL STRICTURE 10/12/2007  . Rheumatoid arthritis ([place]) 02/27/2007  . Hypertension with heart disease 01/19/2007  . Allergic rhinitis 01/19/2007  . GERD 01/19/2007    Sigurd Sos, PT 11/07/19 12:34 PM  [facility] [address], [place], [place], [address] Phone: [phone]   Fax:  [phone]  Name: [name] MRN: [id number] Date of Birth: 01/21/1947

## 2019-11-08 ENCOUNTER — Encounter: Payer: Self-pay | Admitting: Physical Therapy

## 2019-11-08 ENCOUNTER — Ambulatory Visit: Payer: Medicare HMO | Admitting: Physical Therapy

## 2019-11-08 ENCOUNTER — Other Ambulatory Visit: Payer: Self-pay

## 2019-11-08 DIAGNOSIS — R252 Cramp and spasm: Secondary | ICD-10-CM

## 2019-11-08 DIAGNOSIS — G8929 Other chronic pain: Secondary | ICD-10-CM | POA: Diagnosis not present

## 2019-11-08 DIAGNOSIS — M25512 Pain in left shoulder: Secondary | ICD-10-CM | POA: Diagnosis not present

## 2019-11-08 DIAGNOSIS — R293 Abnormal posture: Secondary | ICD-10-CM | POA: Diagnosis not present

## 2019-11-08 DIAGNOSIS — M25612 Stiffness of left shoulder, not elsewhere classified: Secondary | ICD-10-CM | POA: Diagnosis not present

## 2019-11-08 NOTE — Therapy (Signed)
[facility] [address], [place], [place], [address] Phone: [phone]   Fax:  [phone]  Physical Therapy Treatment  Patient Details  Name: [name] MRN: [id number] Date of Birth: 01/21/1947 Referring Provider (PT): [name], Betty, MD   Encounter Date: 11/08/2019  PT End of Session - 11/08/19 1231    Visit Number  3    Date for PT Re-Evaluation  12/11/19    PT Start Time  1229    PT Stop Time  1312    PT Time Calculation (min)  43 min    Activity Tolerance  Patient tolerated treatment well    Behavior During Therapy  [facility] for tasks assessed/performed       Past Medical History:  Diagnosis Date  . Allergy   . Anxiety   . Arthritis    RA (Dr. Ouida Sills) Bilateral hands  . C. difficile diarrhea    history of  . CAD (coronary artery disease), native coronary artery    a. Cardiac CT with + FFR for Ramus b. cath 05/2017 100% sub acute lesion to mid circumflex artery s/p DES.  . Cancer ([place])    Right kidney CA removed.   . CKD (chronic kidney disease) stage 3, GFR 30-59 ml/min 10/11/2016   s/p R nephrectomy  . Colon polyps 2008   HYPERPLASTIC  . Costochondritis    a. Nuc Stress Test 6/16: EF 70, no scar or ischemia, low risk // b. Echo 3/16: mild conc LVH, EF 65-70, no RWMA, Gr 1 DD, mild TR  . Diabetes mellitus without complication ([place])    type 2  no meds  . Dyspnea   . Gastritis   . GERD (gastroesophageal reflux disease)   . History of echocardiogram    Echo 3/18:  Moderate LVH, EF [id number], grade 1 diastolic dysfunction, calcified aortic valve, mild MR, moderate LAE  . History of nuclear stress test    Myoview 3/18: Mod size and intensity fixed septal defect, may be artifact.Opposite mod size and intensity lat defect, which is reversible and could represent ischemia or possibly artifact (SDS 4). LVEF 71% with normal wall motion. Intermediate risk study. >> images reviewed with Dr. Dorris Carnes - no sig  ischemia; med rx   . History of pneumonia   . Hx of cardiovascular stress test    Lexiscan Myoview 6/16:  EF 70%, no scar or ischemia; Low Risk  . Hypertension   . Neuromuscular disorder ([place])    Neruopathy in bilateral feet  . Neuropathy   . Orthostatic hypotension 05/28/2017  . Osteoarthritis   . Pneumonia   . S/P angioplasty with stent 05/27/17 to LCX with DES  05/28/2017  . Thyroid disease     Past Surgical History:  Procedure Laterality Date  . ABDOMINAL HYSTERECTOMY  1978  . ANTERIOR CERVICAL DECOMP/DISCECTOMY FUSION N/A 05/29/2018   Procedure: ANTERIOR CERVICAL DECOMPRESSION FUSION - CERVICAL FIVE-CERVICAL SIX - CERVICAL SIX-CERVICAL SEVEN;  Surgeon: Earnie Larsson, MD;  Location: [place];  Service: Neurosurgery;  Laterality: N/A;  . BACK SURGERY     x 2  . CARPAL TUNNEL RELEASE Left   . COLONOSCOPY W/ BIOPSIES AND POLYPECTOMY     Hx: of  . CORONARY STENT INTERVENTION N/A 05/27/2017   Procedure: CORONARY STENT INTERVENTION;  Surgeon: Burnell Blanks, MD;  Location: [place] [facility];  Service: Cardiovascular;  Laterality: N/A;  . ESOPHAGOGASTRODUODENOSCOPY    . HNP    . LEFT HEART CATH AND CORONARY ANGIOGRAPHY  N/A 05/27/2017   Procedure: LEFT HEART CATH AND CORONARY ANGIOGRAPHY;  Surgeon: Burnell Blanks, MD;  Location: [place] [facility];  Service: Cardiovascular;  Laterality: N/A;  . LUMBAR LAMINECTOMY/DECOMPRESSION MICRODISCECTOMY Left 02/12/2013   Procedure: LUMBAR TWO THREE, LUMBAR THREE FOUR, LUMBAR FOUR FIVE  LAMINECTOMY/DECOMPRESSION MICRODISCECTOMY 3 LEVELS;  Surgeon: Charlie Pitter, MD;  Location: [place] [facility];  Service: Neurosurgery;  Laterality: Left;  . NEPHRECTOMY Right 2010   10.rcc cancer  . TOTAL KNEE ARTHROPLASTY Right    Redo    There were no vitals filed for this visit.  Subjective Assessment - 11/08/19 1317    Subjective  Patient reports mild pain in left shoulder today.    Pertinent History  kidney cancer, HTN    How long can you sit  comfortably?  Use of Lt UE: limited with cleaning and household tasks, reaching overhead.  Pt using Lt UE 65% of normal.    Diagnostic tests  x-ray: OA of LT shoulder.  Bone in joint or piece of cartilage    Patient Stated Goals  use arm normally, clean without increased pain, reach overhead    Currently in Pain?  Yes    Pain Location  Shoulder    Pain Orientation  Left    Pain Descriptors / Indicators  Aching    Pain Type  Acute pain                       [facility] Adult PT Treatment/Exercise - 11/08/19 0001      Shoulder Exercises: Supine   Protraction  Left;20 reps;Weights    Protraction Weight (lbs)  2    Flexion  Strengthening;Left;5 reps;Weights    Shoulder Flexion Weight (lbs)  2    Flexion Limitations  also AA with cane x 10      Shoulder Exercises: Seated   Horizontal ABduction  Strengthening;Both;10 reps;Theraband    Theraband Level (Shoulder Horizontal ABduction)  Level 2 (Red)      Shoulder Exercises: Sidelying   External Rotation  Left;15 reps    External Rotation Weight (lbs)  2# wt too heavy    Other Sidelying Exercises  empty can x 10       Shoulder Exercises: Standing   Flexion  AAROM;Left;10 reps    Flexion Limitations  using finger ladder    Extension  Strengthening;Theraband;20 reps    Theraband Level (Shoulder Extension)  Level 3 (Green)    Row  [employer];Theraband    Theraband Level (Shoulder Row)  Level 3 (Green)    Row Limitations  2nd set with 5 sec hold      Shoulder Exercises: Pulleys   Flexion  3 minutes      Shoulder Exercises: Stretch   Corner Stretch  3 reps    Corner Stretch Limitations  15 sec left arm on door (bil too painful)               PT Short Term Goals - 11/07/19 1157      PT SHORT TERM GOAL #1   Title  pt to be independent with inital HEP    Baseline  just getting consistent    Time  4    Period  Weeks    Status  On-going      PT SHORT TERM GOAL #2   Title  report a 30% reduction in Lt  shoulder pain with daily use    Baseline  10% better    Time  4    Period  Weeks    Status  On-going        PT Long Term Goals - 10/16/19 1228      PT LONG TERM GOAL #1   Title  be indpendent in advanced HEP    Time  8    Period  Weeks    Status  New    Target Date  12/11/19      PT LONG TERM GOAL #2   Title  demonstrate Rt shoulder A/ROM flexion to > or = to 140 degrees to improve overhead reaching    Time  8    Period  Weeks    Status  New    Target Date  12/11/19      PT LONG TERM GOAL #3   Title  reduce FOTO to < to 34% limitation    Time  8    Period  Weeks    Status  New    Target Date  12/11/19      PT LONG TERM GOAL #4   Title  improve Lt shoulder strength to > or = to 4+/5 throughout to improve functional endurance with use    Time  8    Period  Weeks    Status  New    Target Date  12/11/19      PT LONG TERM GOAL #5   Title  report < or = to 3-4/10 Lt shoulder pain with reaching overhead or cleaning    Time  8    Period  Weeks    Status  New    Target Date  12/11/19            Plan - 11/08/19 1315    Clinical Impression Statement  Patient tolerated strengthening very well today. She needs cueing for correct form with many of them to keep UT's relaxed and with IR/ER to maintain 90 deg angle at elbow and keep arm at side.    Comorbidities  HTN, neuropathy, history of kidney cancer    PT Treatment/Interventions  ADLs/Self Care Home Management;Cryotherapy;Electrical Stimulation;Moist Heat;Iontophoresis 4mg /ml Dexamethasone;Therapeutic activities;Therapeutic exercise;Neuromuscular re-education;Manual techniques;Passive range of motion;Dry needling;Taping    PT Next Visit Plan  Lt shoulder ROM, postural strength, manual    PT Home Exercise Plan  Access Code: [email or web address]       Patient will benefit from skilled therapeutic intervention in order to improve the following deficits and impairments:  Abnormal gait, Decreased activity tolerance, Decreased  balance, Decreased mobility, Decreased strength, Postural dysfunction, Improper body mechanics, Impaired flexibility, Pain, Impaired UE functional use, Increased muscle spasms, Decreased range of motion  Visit Diagnosis: Chronic left shoulder pain  Stiffness of left shoulder, not elsewhere classified  Abnormal posture  Cramp and spasm     Problem List Patient Active Problem List   Diagnosis Date Noted  . Malignant neoplasm of kidney excluding renal pelvis, unspecified laterality ([place]) 09/26/2019  . Vitamin D deficiency, unspecified 09/26/2019  . Anxiety disorder, unspecified 03/26/2019  . Tobacco use disorder 09/29/2018  . Cervical myelopathy ([place]) 05/29/2018  . Cervicalgia 09/16/2017  . Orthostatic hypotension 05/28/2017  . S/P angioplasty with stent 05/27/17 to LCX with DES  05/28/2017  . CAD (coronary artery disease)   . Polyneuropathy 05/25/2017  . Chest pain with moderate risk for cardiac etiology 05/25/2017  . Class 1 obesity with serious comorbidity and body mass index (BMI) of 34.0 to 34.9 in adult 12/06/2016  . CKD (chronic kidney disease) stage 3, GFR 30-59 ml/min  10/11/2016  . Hot flashes, menopausal 10/01/2016  . Chest pain with moderate risk of acute coronary syndrome 10/25/2014  . Degenerative spondylolisthesis 10/15/2014  . Spinal stenosis, lumbar region, with neurogenic claudication 02/12/2013  . DM (diabetes mellitus), type 2 with renal complications ([place]) 123XX123  . History of Rtr nephrectomy 2010, secondary to renal cell cancer 01/14/2009  . PERSONAL HX COLONIC POLYPS 11/14/2008  . Hyperlipidemia associated with type 2 diabetes mellitus ([place]) 11/16/2007  . ESOPHAGEAL STRICTURE 10/12/2007  . Rheumatoid arthritis ([place]) 02/27/2007  . Hypertension with heart disease 01/19/2007  . Allergic rhinitis 01/19/2007  . GERD 01/19/2007    Madelyn Flavors PT 11/08/2019, 1:18 PM  [facility] [address]  [place], [place], [address] Phone: [phone]   Fax:  [phone]  Name: [name] MRN: [id number] Date of Birth: 01/21/1947

## 2019-11-15 ENCOUNTER — Ambulatory Visit: Payer: Medicare HMO | Admitting: Physical Therapy

## 2019-11-15 ENCOUNTER — Other Ambulatory Visit: Payer: Self-pay

## 2019-11-15 DIAGNOSIS — G8929 Other chronic pain: Secondary | ICD-10-CM | POA: Diagnosis not present

## 2019-11-15 DIAGNOSIS — M25512 Pain in left shoulder: Secondary | ICD-10-CM | POA: Diagnosis not present

## 2019-11-15 DIAGNOSIS — R252 Cramp and spasm: Secondary | ICD-10-CM | POA: Diagnosis not present

## 2019-11-15 DIAGNOSIS — M25612 Stiffness of left shoulder, not elsewhere classified: Secondary | ICD-10-CM | POA: Diagnosis not present

## 2019-11-15 DIAGNOSIS — R293 Abnormal posture: Secondary | ICD-10-CM

## 2019-11-15 NOTE — Therapy (Signed)
[facility] [address], [place], [place], [address] Phone: [phone]   Fax:  [phone]  Physical Therapy Treatment  Patient Details  Name: [name] MRN: [id number] Date of Birth: 01/21/1947 Referring Provider (PT): [name], Betty, MD   Encounter Date: 11/15/2019  PT End of Session - 11/15/19 1317    Visit Number  4    Date for PT Re-Evaluation  12/11/19    PT Start Time  1230    PT Stop Time  1315    PT Time Calculation (min)  45 min    Activity Tolerance  Patient tolerated treatment well    Behavior During Therapy  [facility] for tasks assessed/performed       Past Medical History:  Diagnosis Date  . Allergy   . Anxiety   . Arthritis    RA (Dr. Ouida Sills) Bilateral hands  . C. difficile diarrhea    history of  . CAD (coronary artery disease), native coronary artery    a. Cardiac CT with + FFR for Ramus b. cath 05/2017 100% sub acute lesion to mid circumflex artery s/p DES.  . Cancer ([place])    Right kidney CA removed.   . CKD (chronic kidney disease) stage 3, GFR 30-59 ml/min 10/11/2016   s/p R nephrectomy  . Colon polyps 2008   HYPERPLASTIC  . Costochondritis    a. Nuc Stress Test 6/16: EF 70, no scar or ischemia, low risk // b. Echo 3/16: mild conc LVH, EF 65-70, no RWMA, Gr 1 DD, mild TR  . Diabetes mellitus without complication ([place])    type 2  no meds  . Dyspnea   . Gastritis   . GERD (gastroesophageal reflux disease)   . History of echocardiogram    Echo 3/18:  Moderate LVH, EF [id number], grade 1 diastolic dysfunction, calcified aortic valve, mild MR, moderate LAE  . History of nuclear stress test    Myoview 3/18: Mod size and intensity fixed septal defect, may be artifact.Opposite mod size and intensity lat defect, which is reversible and could represent ischemia or possibly artifact (SDS 4). LVEF 71% with normal wall motion. Intermediate risk study. >> images reviewed with Dr. Dorris Carnes - no sig  ischemia; med rx   . History of pneumonia   . Hx of cardiovascular stress test    Lexiscan Myoview 6/16:  EF 70%, no scar or ischemia; Low Risk  . Hypertension   . Neuromuscular disorder ([place])    Neruopathy in bilateral feet  . Neuropathy   . Orthostatic hypotension 05/28/2017  . Osteoarthritis   . Pneumonia   . S/P angioplasty with stent 05/27/17 to LCX with DES  05/28/2017  . Thyroid disease     Past Surgical History:  Procedure Laterality Date  . ABDOMINAL HYSTERECTOMY  1978  . ANTERIOR CERVICAL DECOMP/DISCECTOMY FUSION N/A 05/29/2018   Procedure: ANTERIOR CERVICAL DECOMPRESSION FUSION - CERVICAL FIVE-CERVICAL SIX - CERVICAL SIX-CERVICAL SEVEN;  Surgeon: Earnie Larsson, MD;  Location: [place];  Service: Neurosurgery;  Laterality: N/A;  . BACK SURGERY     x 2  . CARPAL TUNNEL RELEASE Left   . COLONOSCOPY W/ BIOPSIES AND POLYPECTOMY     Hx: of  . CORONARY STENT INTERVENTION N/A 05/27/2017   Procedure: CORONARY STENT INTERVENTION;  Surgeon: Burnell Blanks, MD;  Location: [place] [facility];  Service: Cardiovascular;  Laterality: N/A;  . ESOPHAGOGASTRODUODENOSCOPY    . HNP    . LEFT HEART CATH AND CORONARY ANGIOGRAPHY  N/A 05/27/2017   Procedure: LEFT HEART CATH AND CORONARY ANGIOGRAPHY;  Surgeon: Burnell Blanks, MD;  Location: [place] [facility];  Service: Cardiovascular;  Laterality: N/A;  . LUMBAR LAMINECTOMY/DECOMPRESSION MICRODISCECTOMY Left 02/12/2013   Procedure: LUMBAR TWO THREE, LUMBAR THREE FOUR, LUMBAR FOUR FIVE  LAMINECTOMY/DECOMPRESSION MICRODISCECTOMY 3 LEVELS;  Surgeon: Charlie Pitter, MD;  Location: [place] [facility];  Service: Neurosurgery;  Laterality: Left;  . NEPHRECTOMY Right 2010   10.rcc cancer  . TOTAL KNEE ARTHROPLASTY Right    Redo    There were no vitals filed for this visit.  Subjective Assessment - 11/15/19 1324    Subjective  Patient reporting pain at end range flexion mainly.    How long can you sit comfortably?  Use of Lt UE: limited with  cleaning and household tasks, reaching overhead.  Pt using Lt UE 65% of normal.    Diagnostic tests  x-ray: OA of LT shoulder.  Bone in joint or piece of cartilage    Patient Stated Goals  use arm normally, clean without increased pain, reach overhead    Currently in Pain?  Yes    Pain Score  7     Pain Location  Shoulder    Pain Orientation  Left    Pain Descriptors / Indicators  Aching    Pain Type  Acute pain                       [facility] Adult PT Treatment/Exercise - 11/15/19 0001      Self-Care   Self-Care  Other Self-Care Comments    Other Self-Care Comments   also MFR with ball to scapular muscles and deltoids      Shoulder Exercises: Sidelying   External Rotation  Left;20 reps    External Rotation Limitations  Verbal and tactile cues for proper form      Manual Therapy   Manual Therapy  Soft tissue mobilization;Passive ROM    Soft tissue mobilization  to left UT, Infraspinatus and deltoids in SDLY    Passive ROM  to left shouder into IR/ER              PT Education - 11/15/19 1320    Education Details  HEP Progressed    Person(s) Educated  Patient    Methods  Explanation;Demonstration;Handout    Comprehension  Verbalized understanding;Returned demonstration       PT Short Term Goals - 11/15/19 1323      PT SHORT TERM GOAL #1   Title  pt to be independent with inital HEP    Baseline  pt requiring cueing for correct form in clinic for most exercises    Status  On-going      PT SHORT TERM GOAL #2   Title  report a 30% reduction in Lt shoulder pain with daily use    Baseline  10% better    Status  On-going        PT Long Term Goals - 10/16/19 1228      PT LONG TERM GOAL #1   Title  be indpendent in advanced HEP    Time  8    Period  Weeks    Status  New    Target Date  12/11/19      PT LONG TERM GOAL #2   Title  demonstrate Rt shoulder A/ROM flexion to > or = to 140 degrees to improve overhead reaching    Time  8  Period  Weeks     Status  New    Target Date  12/11/19      PT LONG TERM GOAL #3   Title  reduce FOTO to < to 34% limitation    Time  8    Period  Weeks    Status  New    Target Date  12/11/19      PT LONG TERM GOAL #4   Title  improve Lt shoulder strength to > or = to 4+/5 throughout to improve functional endurance with use    Time  8    Period  Weeks    Status  New    Target Date  12/11/19      PT LONG TERM GOAL #5   Title  report < or = to 3-4/10 Lt shoulder pain with reaching overhead or cleaning    Time  8    Period  Weeks    Status  New    Target Date  12/11/19            Plan - 11/15/19 1324    Clinical Impression Statement  Patient continuing to have pain with OH activities and with end range flexion, ER and IR. She continues to require VCs for proper form with therex. She has a lot of tightness in her left UQ and responded well to STW here today. She would benefit from DN to left UT, Infraspinatus, subscapularis and deltoids to decrease pain and tissue tension. She is slow to progress with goals. HEP was progressed.    Comorbidities  HTN, neuropathy, history of kidney cancer    PT Frequency  2x / week    PT Duration  8 weeks    PT Treatment/Interventions  ADLs/Self Care Home Management;Cryotherapy;Electrical Stimulation;Moist Heat;Iontophoresis 4mg /ml Dexamethasone;Therapeutic activities;Therapeutic exercise;Neuromuscular re-education;Manual techniques;Passive range of motion;Dry needling;Taping    PT Next Visit Plan  Try DN if pt agreeable (she has had in the past); Lt shoulder ROM, postural strength, STW    PT Home Exercise Plan  Access Code: [email or web address]    Consulted and Agree with Plan of Care  Patient       Patient will benefit from skilled therapeutic intervention in order to improve the following deficits and impairments:  Abnormal gait, Decreased activity tolerance, Decreased balance, Decreased mobility, Decreased strength, Postural dysfunction, Improper body mechanics,  Impaired flexibility, Pain, Impaired UE functional use, Increased muscle spasms, Decreased range of motion  Visit Diagnosis: Stiffness of left shoulder, not elsewhere classified  Chronic left shoulder pain  Abnormal posture  Cramp and spasm     Problem List Patient Active Problem List   Diagnosis Date Noted  . Malignant neoplasm of kidney excluding renal pelvis, unspecified laterality ([place]) 09/26/2019  . Vitamin D deficiency, unspecified 09/26/2019  . Anxiety disorder, unspecified 03/26/2019  . Tobacco use disorder 09/29/2018  . Cervical myelopathy ([place]) 05/29/2018  . Cervicalgia 09/16/2017  . Orthostatic hypotension 05/28/2017  . S/P angioplasty with stent 05/27/17 to LCX with DES  05/28/2017  . CAD (coronary artery disease)   . Polyneuropathy 05/25/2017  . Chest pain with moderate risk for cardiac etiology 05/25/2017  . Class 1 obesity with serious comorbidity and body mass index (BMI) of 34.0 to 34.9 in adult 12/06/2016  . CKD (chronic kidney disease) stage 3, GFR 30-59 ml/min 10/11/2016  . Hot flashes, menopausal 10/01/2016  . Chest pain with moderate risk of acute coronary syndrome 10/25/2014  . Degenerative spondylolisthesis 10/15/2014  . Spinal stenosis, lumbar region, with neurogenic claudication  02/12/2013  . DM (diabetes mellitus), type 2 with renal complications ([place]) 123XX123  . History of Rtr nephrectomy 2010, secondary to renal cell cancer 01/14/2009  . PERSONAL HX COLONIC POLYPS 11/14/2008  . Hyperlipidemia associated with type 2 diabetes mellitus ([place]) 11/16/2007  . ESOPHAGEAL STRICTURE 10/12/2007  . Rheumatoid arthritis ([place]) 02/27/2007  . Hypertension with heart disease 01/19/2007  . Allergic rhinitis 01/19/2007  . GERD 01/19/2007    Madelyn Flavors PT 11/15/2019, 1:30 PM  [facility] [address], [place], [place], [address] Phone: [phone]   Fax:  [phone]  Name: [name] MRN: [id number] Date of Birth: 01/21/1947

## 2019-11-15 NOTE — Patient Instructions (Signed)
Access Code: [email or web address] URL: [email or web address] Date: 11/15/2019 Prepared by: Almyra Free Madyx Delfin  Exercises Seated Cervical Sidebending AROM - 3 x daily - 7 x weekly - 3 reps - 1 sets - 20 hold Seated Correct Posture - 1 x daily - 7 x weekly - 10 reps - 3 sets Seated Scapular Retraction - 5 x daily - 7 x weekly - 2 sets - 10 reps - 5 hold Supine Shoulder Flexion PROM - 3 x daily - 7 x weekly - 1 sets - 10 reps - 10 hold Scapular Retraction with Resistance - 1 x daily - 7 x weekly - 3 sets - 10 reps Scapular Retraction with Resistance Advanced - 1 x daily - 7 x weekly - 3 sets - 10 reps Sidelying Shoulder External Rotation - 1 x daily - 7 x weekly - 2 sets - 10 reps

## 2019-11-19 ENCOUNTER — Ambulatory Visit: Payer: Medicare HMO

## 2019-11-19 ENCOUNTER — Other Ambulatory Visit: Payer: Self-pay

## 2019-11-19 DIAGNOSIS — R293 Abnormal posture: Secondary | ICD-10-CM | POA: Diagnosis not present

## 2019-11-19 DIAGNOSIS — M25612 Stiffness of left shoulder, not elsewhere classified: Secondary | ICD-10-CM

## 2019-11-19 DIAGNOSIS — R252 Cramp and spasm: Secondary | ICD-10-CM | POA: Diagnosis not present

## 2019-11-19 DIAGNOSIS — G8929 Other chronic pain: Secondary | ICD-10-CM | POA: Diagnosis not present

## 2019-11-19 DIAGNOSIS — M25512 Pain in left shoulder: Secondary | ICD-10-CM | POA: Diagnosis not present

## 2019-11-19 NOTE — Therapy (Signed)
[facility] [address] [place], [place], [address] Phone: [phone]   Fax:  [phone]  Physical Therapy Treatment  Patient Details  Name: [name] MRN: [id number] Date of Birth: 01/21/1947 Referring Provider (PT): [name], Betty, MD   Encounter Date: 11/19/2019  PT End of Session - 11/19/19 1223    Visit Number  5    Date for PT Re-Evaluation  12/11/19    Authorization Type  Aetna    PT Start Time  1148    PT Stop Time  1226    PT Time Calculation (min)  38 min    Activity Tolerance  Patient tolerated treatment well    Behavior During Therapy  [facility] for tasks assessed/performed       Past Medical History:  Diagnosis Date  . Allergy   . Anxiety   . Arthritis    RA (Dr. Ouida Sills) Bilateral hands  . C. difficile diarrhea    history of  . CAD (coronary artery disease), native coronary artery    a. Cardiac CT with + FFR for Ramus b. cath 05/2017 100% sub acute lesion to mid circumflex artery s/p DES.  . Cancer ([place])    Right kidney CA removed.   . CKD (chronic kidney disease) stage 3, GFR 30-59 ml/min 10/11/2016   s/p R nephrectomy  . Colon polyps 2008   HYPERPLASTIC  . Costochondritis    a. Nuc Stress Test 6/16: EF 70, no scar or ischemia, low risk // b. Echo 3/16: mild conc LVH, EF 65-70, no RWMA, Gr 1 DD, mild TR  . Diabetes mellitus without complication ([place])    type 2  no meds  . Dyspnea   . Gastritis   . GERD (gastroesophageal reflux disease)   . History of echocardiogram    Echo 3/18:  Moderate LVH, EF [id number], grade 1 diastolic dysfunction, calcified aortic valve, mild MR, moderate LAE  . History of nuclear stress test    Myoview 3/18: Mod size and intensity fixed septal defect, may be artifact.Opposite mod size and intensity lat defect, which is reversible and could represent ischemia or possibly artifact (SDS 4). LVEF 71% with normal wall motion. Intermediate risk study. >> images reviewed with  Dr. Dorris Carnes - no sig ischemia; med rx   . History of pneumonia   . Hx of cardiovascular stress test    Lexiscan Myoview 6/16:  EF 70%, no scar or ischemia; Low Risk  . Hypertension   . Neuromuscular disorder ([place])    Neruopathy in bilateral feet  . Neuropathy   . Orthostatic hypotension 05/28/2017  . Osteoarthritis   . Pneumonia   . S/P angioplasty with stent 05/27/17 to LCX with DES  05/28/2017  . Thyroid disease     Past Surgical History:  Procedure Laterality Date  . ABDOMINAL HYSTERECTOMY  1978  . ANTERIOR CERVICAL DECOMP/DISCECTOMY FUSION N/A 05/29/2018   Procedure: ANTERIOR CERVICAL DECOMPRESSION FUSION - CERVICAL FIVE-CERVICAL SIX - CERVICAL SIX-CERVICAL SEVEN;  Surgeon: Earnie Larsson, MD;  Location: [place];  Service: Neurosurgery;  Laterality: N/A;  . BACK SURGERY     x 2  . CARPAL TUNNEL RELEASE Left   . COLONOSCOPY W/ BIOPSIES AND POLYPECTOMY     Hx: of  . CORONARY STENT INTERVENTION N/A 05/27/2017   Procedure: CORONARY STENT INTERVENTION;  Surgeon: Burnell Blanks, MD;  Location: [place] [facility];  Service: Cardiovascular;  Laterality: N/A;  . ESOPHAGOGASTRODUODENOSCOPY    . HNP    .  LEFT HEART CATH AND CORONARY ANGIOGRAPHY N/A 05/27/2017   Procedure: LEFT HEART CATH AND CORONARY ANGIOGRAPHY;  Surgeon: Burnell Blanks, MD;  Location: [place] [facility];  Service: Cardiovascular;  Laterality: N/A;  . LUMBAR LAMINECTOMY/DECOMPRESSION MICRODISCECTOMY Left 02/12/2013   Procedure: LUMBAR TWO THREE, LUMBAR THREE FOUR, LUMBAR FOUR FIVE  LAMINECTOMY/DECOMPRESSION MICRODISCECTOMY 3 LEVELS;  Surgeon: Charlie Pitter, MD;  Location: [place] [facility];  Service: Neurosurgery;  Laterality: Left;  . NEPHRECTOMY Right 2010   10.rcc cancer  . TOTAL KNEE ARTHROPLASTY Right    Redo    There were no vitals filed for this visit.  Subjective Assessment - 11/19/19 1146    Subjective  I got a good workout last time.  I was sore after the manual work and i used heat and it felt  better.    Currently in Pain?  Yes    Pain Score  6     Pain Location  Shoulder    Pain Orientation  Left    Pain Descriptors / Indicators  Aching    Pain Type  Acute pain    Pain Onset  More than a month ago    Pain Frequency  Constant    Aggravating Factors   reaching overhead, cleaning    Pain Relieving Factors  not using the Lt arm                       [facility] Adult PT Treatment/Exercise - 11/19/19 0001      Shoulder Exercises: Sidelying   External Rotation  Left;20 reps    External Rotation Limitations  Verbal and tactile cues for proper form      Shoulder Exercises: Standing   Extension  Strengthening;Theraband;20 reps    Theraband Level (Shoulder Extension)  Level 3 (Green)    Extension Limitations  tactile cues for scapular depression    Row  Strengthening;20 reps;Theraband    Theraband Level (Shoulder Row)  Level 3 (Green)    Row Limitations  tactile cues for scapular depression      Shoulder Exercises: ROM/Strengthening   UBE (Upper Arm Bike)  Level 1x 6 minutes (3/3)      Manual Therapy   Manual Therapy  Soft tissue mobilization;Passive ROM    Manual therapy comments  Addaday to Lt upper traps and external rotators    Passive ROM  to left shouder into IR/ER                PT Short Term Goals - 11/19/19 1159      PT SHORT TERM GOAL #1   Title  pt to be independent with inital HEP    Status  Achieved      PT SHORT TERM GOAL #2   Title  report a 30% reduction in Lt shoulder pain with daily use    Baseline  40% improvement    Status  Achieved      PT SHORT TERM GOAL #3   Title  report > or = to 75% use of Lt UE with home tasks    Time  4    Status  On-going        PT Long Term Goals - 10/16/19 1228      PT LONG TERM GOAL #1   Title  be indpendent in advanced HEP    Time  8    Period  Weeks    Status  New    Target Date  12/11/19  PT LONG TERM GOAL #2   Title  demonstrate Rt shoulder A/ROM flexion to > or = to 140  degrees to improve overhead reaching    Time  8    Period  Weeks    Status  New    Target Date  12/11/19      PT LONG TERM GOAL #3   Title  reduce FOTO to < to 34% limitation    Time  8    Period  Weeks    Status  New    Target Date  12/11/19      PT LONG TERM GOAL #4   Title  improve Lt shoulder strength to > or = to 4+/5 throughout to improve functional endurance with use    Time  8    Period  Weeks    Status  New    Target Date  12/11/19      PT LONG TERM GOAL #5   Title  report < or = to 3-4/10 Lt shoulder pain with reaching overhead or cleaning    Time  8    Period  Weeks    Status  New    Target Date  12/11/19            Plan - 11/19/19 1202    Clinical Impression Statement  Pt was able to clean her blinds at home with her Lt UE over the weekend.  Pt reports 40% overall improvement in Lt shoulder pain and function since the start of care.  Pt requires tactile cues for technique with ER in sidelying and rowing/extension in standing.   Pt with tension and trigger points in Rt upper traps and external rotators.  Pt would like to try dry needling next session.  Pt will benefit from skilled PT to address Lt shoulder pain and postural dysfunction.       Patient will benefit from skilled therapeutic intervention in order to improve the following deficits and impairments:  Abnormal gait, Decreased activity tolerance, Decreased balance, Decreased mobility, Decreased strength, Postural dysfunction, Improper body mechanics, Impaired flexibility, Pain, Impaired UE functional use, Increased muscle spasms, Decreased range of motion  Visit Diagnosis: Stiffness of left shoulder, not elsewhere classified  Chronic left shoulder pain  Abnormal posture  Cramp and spasm     Problem List Patient Active Problem List   Diagnosis Date Noted  . Malignant neoplasm of kidney excluding renal pelvis, unspecified laterality ([place]) 09/26/2019  . Vitamin D deficiency, unspecified  09/26/2019  . Anxiety disorder, unspecified 03/26/2019  . Tobacco use disorder 09/29/2018  . Cervical myelopathy ([place]) 05/29/2018  . Cervicalgia 09/16/2017  . Orthostatic hypotension 05/28/2017  . S/P angioplasty with stent 05/27/17 to LCX with DES  05/28/2017  . CAD (coronary artery disease)   . Polyneuropathy 05/25/2017  . Chest pain with moderate risk for cardiac etiology 05/25/2017  . Class 1 obesity with serious comorbidity and body mass index (BMI) of 34.0 to 34.9 in adult 12/06/2016  . CKD (chronic kidney disease) stage 3, GFR 30-59 ml/min 10/11/2016  . Hot flashes, menopausal 10/01/2016  . Chest pain with moderate risk of acute coronary syndrome 10/25/2014  . Degenerative spondylolisthesis 10/15/2014  . Spinal stenosis, lumbar region, with neurogenic claudication 02/12/2013  . DM (diabetes mellitus), type 2 with renal complications ([place]) [id number]  . History of Rtr nephrectomy 2010, secondary to renal cell cancer 01/14/2009  . PERSONAL HX COLONIC POLYPS 11/14/2008  . Hyperlipidemia associated with type 2 diabetes mellitus ([place]) 11/16/2007  . ESOPHAGEAL  STRICTURE 10/12/2007  . Rheumatoid arthritis ([place]) 02/27/2007  . Hypertension with heart disease 01/19/2007  . Allergic rhinitis 01/19/2007  . GERD 01/19/2007     Sigurd Sos, PT 11/19/19 12:24 PM  [facility] [address], [place], [place], [address] Phone: [phone]   Fax:  [phone]  Name: [name] MRN: [id number] Date of Birth: 01/21/1947

## 2019-11-26 ENCOUNTER — Other Ambulatory Visit: Payer: Self-pay

## 2019-11-26 ENCOUNTER — Other Ambulatory Visit: Payer: Self-pay | Admitting: Family Medicine

## 2019-11-26 ENCOUNTER — Ambulatory Visit: Payer: Medicare HMO

## 2019-11-26 DIAGNOSIS — M25512 Pain in left shoulder: Secondary | ICD-10-CM | POA: Diagnosis not present

## 2019-11-26 DIAGNOSIS — G8929 Other chronic pain: Secondary | ICD-10-CM | POA: Diagnosis not present

## 2019-11-26 DIAGNOSIS — R252 Cramp and spasm: Secondary | ICD-10-CM

## 2019-11-26 DIAGNOSIS — R293 Abnormal posture: Secondary | ICD-10-CM | POA: Diagnosis not present

## 2019-11-26 DIAGNOSIS — M25612 Stiffness of left shoulder, not elsewhere classified: Secondary | ICD-10-CM | POA: Diagnosis not present

## 2019-11-26 DIAGNOSIS — I119 Hypertensive heart disease without heart failure: Secondary | ICD-10-CM

## 2019-11-26 NOTE — Therapy (Addendum)
[facility] [address] [place], [place], [address] Phone: [phone]   Fax:  [phone]  Physical Therapy Treatment  Patient Details  Name: [name] MRN: [id number] Date of Birth: 01/21/1947 Referring Provider (PT): [name], Betty, MD   Encounter Date: 11/26/2019  PT End of Session - 11/26/19 1313    Visit Number  6    Date for PT Re-Evaluation  12/11/19    Authorization Type  Aetna    PT Start Time  1062   late   PT Stop Time  1316    PT Time Calculation (min)  30 min    Activity Tolerance  Patient tolerated treatment well    Behavior During Therapy  [facility] for tasks assessed/performed       Past Medical History:  Diagnosis Date  . Allergy   . Anxiety   . Arthritis    RA (Dr. Ouida Sills) Bilateral hands  . C. difficile diarrhea    history of  . CAD (coronary artery disease), native coronary artery    a. Cardiac CT with + FFR for Ramus b. cath 05/2017 100% sub acute lesion to mid circumflex artery s/p DES.  . Cancer ([place])    Right kidney CA removed.   . CKD (chronic kidney disease) stage 3, GFR 30-59 ml/min 10/11/2016   s/p R nephrectomy  . Colon polyps 2008   HYPERPLASTIC  . Costochondritis    a. Nuc Stress Test 6/16: EF 70, no scar or ischemia, low risk // b. Echo 3/16: mild conc LVH, EF 65-70, no RWMA, Gr 1 DD, mild TR  . Diabetes mellitus without complication ([place])    type 2  no meds  . Dyspnea   . Gastritis   . GERD (gastroesophageal reflux disease)   . History of echocardiogram    Echo 3/18:  Moderate LVH, EF 69-48, grade 1 diastolic dysfunction, calcified aortic valve, mild MR, moderate LAE  . History of nuclear stress test    Myoview 3/18: Mod size and intensity fixed septal defect, may be artifact.Opposite mod size and intensity lat defect, which is reversible and could represent ischemia or possibly artifact (SDS 4). LVEF 71% with normal wall motion. Intermediate risk study. >> images  reviewed with Dr. Dorris Carnes - no sig ischemia; med rx   . History of pneumonia   . Hx of cardiovascular stress test    Lexiscan Myoview 6/16:  EF 70%, no scar or ischemia; Low Risk  . Hypertension   . Neuromuscular disorder ([place])    Neruopathy in bilateral feet  . Neuropathy   . Orthostatic hypotension 05/28/2017  . Osteoarthritis   . Pneumonia   . S/P angioplasty with stent 05/27/17 to LCX with DES  05/28/2017  . Thyroid disease     Past Surgical History:  Procedure Laterality Date  . ABDOMINAL HYSTERECTOMY  1978  . ANTERIOR CERVICAL DECOMP/DISCECTOMY FUSION N/A 05/29/2018   Procedure: ANTERIOR CERVICAL DECOMPRESSION FUSION - CERVICAL FIVE-CERVICAL SIX - CERVICAL SIX-CERVICAL SEVEN;  Surgeon: Earnie Larsson, MD;  Location: [place];  Service: Neurosurgery;  Laterality: N/A;  . BACK SURGERY     x 2  . CARPAL TUNNEL RELEASE Left   . COLONOSCOPY W/ BIOPSIES AND POLYPECTOMY     Hx: of  . CORONARY STENT INTERVENTION N/A 05/27/2017   Procedure: CORONARY STENT INTERVENTION;  Surgeon: Burnell Blanks, MD;  Location: [place] [facility];  Service: Cardiovascular;  Laterality: N/A;  . ESOPHAGOGASTRODUODENOSCOPY    . HNP    .  LEFT HEART CATH AND CORONARY ANGIOGRAPHY N/A 05/27/2017   Procedure: LEFT HEART CATH AND CORONARY ANGIOGRAPHY;  Surgeon: Burnell Blanks, MD;  Location: [place] [facility];  Service: Cardiovascular;  Laterality: N/A;  . LUMBAR LAMINECTOMY/DECOMPRESSION MICRODISCECTOMY Left 02/12/2013   Procedure: LUMBAR TWO THREE, LUMBAR THREE FOUR, LUMBAR FOUR FIVE  LAMINECTOMY/DECOMPRESSION MICRODISCECTOMY 3 LEVELS;  Surgeon: Charlie Pitter, MD;  Location: [place] [facility];  Service: Neurosurgery;  Laterality: Left;  . NEPHRECTOMY Right 2010   10.rcc cancer  . TOTAL KNEE ARTHROPLASTY Right    Redo    There were no vitals filed for this visit.  Subjective Assessment - 11/26/19 1249    Subjective  I had a bad weekend.  A lot going on with my family.  Pain in the Lt shoulder  increases to 7/10 with folding laundry.    Currently in Pain?  Yes    Pain Score  2     Pain Location  Shoulder    Pain Orientation  Left    Pain Descriptors / Indicators  Aching    Pain Onset  More than a month ago    Pain Frequency  Constant    Aggravating Factors   laundry, reaching overhead, cleaning    Pain Relieving Factors  not using the Lt arm         [facility] PT Assessment - 11/26/19 0001      AROM   Left Shoulder Flexion  120 Degrees    Left Shoulder ABduction  105 Degrees   pain                  [facility] Adult PT Treatment/Exercise - 11/26/19 0001      Shoulder Exercises: Supine   Horizontal ABduction  Strengthening;Both;20 reps    Theraband Level (Shoulder Horizontal ABduction)  Level 2 (Red)    Diagonals  Strengthening;Left;20 reps    Theraband Level (Shoulder Diagonals)  Level 2 (Red)      Shoulder Exercises: Seated   External Rotation  Strengthening;Both;20 reps      Shoulder Exercises: Standing   Flexion  AAROM;Left;10 reps    Flexion Limitations  using finger ladder      Shoulder Exercises: Pulleys   Flexion  3 minutes      Shoulder Exercises: ROM/Strengthening   UBE (Upper Arm Bike)  Level 1x 6 minutes (3/3)               PT Short Term Goals - 11/26/19 1258      PT SHORT TERM GOAL #2   Title  report a 30% reduction in Lt shoulder pain with daily use    Baseline  75%    Status  Achieved      PT SHORT TERM GOAL #3   Title  report > or = to 75% use of Lt UE with home tasks    Status  Achieved        PT Long Term Goals - 11/26/19 1258      PT LONG TERM GOAL #1   Title  be indpendent in advanced HEP    Time  8    Period  Weeks    Status  On-going      PT LONG TERM GOAL #5   Title  report < or = to 3-4/10 Lt shoulder pain with reaching overhead or cleaning    Time  8    Period  Weeks    Status  On-going  Plan - 11/26/19 1303    Clinical Impression Statement  Pt was not able to do her exercises this  weekend due to a lot going on with her family.  Pt reports 75% overall reduction in the frequency and intensity of Lt shoulder pain since the start of care.  Pt reports up to 7/10 Lt shoulder pain with folding laundry.  Pt was able to complete all exercises without pain today.  Pt will continue to benefit from skilled PT to address Lt shoulder strength, flexibility and postural strength.    PT Frequency  2x / week    PT Duration  8 weeks    PT Treatment/Interventions  ADLs/Self Care Home Management;Cryotherapy;Electrical Stimulation;Moist Heat;Iontophoresis 102m/ml Dexamethasone;Therapeutic activities;Therapeutic exercise;Neuromuscular re-education;Manual techniques;Passive range of motion;Dry needling;Taping    PT Next Visit Plan  Try DN if pt agreeable (she has had in the past); Lt shoulder ROM, postural strength, STW    PT Home Exercise Plan  Access Code: [email or web address]   Consulted and Agree with Plan of Care  Patient       Patient will benefit from skilled therapeutic intervention in order to improve the following deficits and impairments:  Abnormal gait, Decreased activity tolerance, Decreased balance, Decreased mobility, Decreased strength, Postural dysfunction, Improper body mechanics, Impaired flexibility, Pain, Impaired UE functional use, Increased muscle spasms, Decreased range of motion  Visit Diagnosis: Chronic left shoulder pain  Stiffness of left shoulder, not elsewhere classified  Abnormal posture  Cramp and spasm     Problem List Patient Active Problem List   Diagnosis Date Noted  . Malignant neoplasm of kidney excluding renal pelvis, unspecified laterality ([place] 09/26/2019  . Vitamin D deficiency, unspecified 09/26/2019  . Anxiety disorder, unspecified 03/26/2019  . Tobacco use disorder 09/29/2018  . Cervical myelopathy ([place] 05/29/2018  . Cervicalgia 09/16/2017  . Orthostatic hypotension 05/28/2017  . S/P angioplasty with stent 05/27/17 to LCX with DES  05/28/2017  .  CAD (coronary artery disease)   . Polyneuropathy 05/25/2017  . Chest pain with moderate risk for cardiac etiology 05/25/2017  . Class 1 obesity with serious comorbidity and body mass index (BMI) of 34.0 to 34.9 in adult 12/06/2016  . CKD (chronic kidney disease) stage 3, GFR 30-59 ml/min 10/11/2016  . Hot flashes, menopausal 10/01/2016  . Chest pain with moderate risk of acute coronary syndrome 10/25/2014  . Degenerative spondylolisthesis 10/15/2014  . Spinal stenosis, lumbar region, with neurogenic claudication 02/12/2013  . DM (diabetes mellitus), type 2 with renal complications ([place] 083/15/1761 . History of Rtr nephrectomy 2010, secondary to renal cell cancer 01/14/2009  . PERSONAL HX COLONIC POLYPS 11/14/2008  . Hyperlipidemia associated with type 2 diabetes mellitus ([place] 11/16/2007  . ESOPHAGEAL STRICTURE 10/12/2007  . Rheumatoid arthritis ([place] 02/27/2007  . Hypertension with heart disease 01/19/2007  . Allergic rhinitis 01/19/2007  . GERD 01/19/2007     KSigurd Sos PT 11/26/19 1:15 PM PHYSICAL THERAPY DISCHARGE SUMMARY  Visits from Start of Care: 6  Current functional level related to goals / functional outcomes: Pt called to cancel all remaining appointments due to a family emergency.     Remaining deficits: See above for most current status.     Education / Equipment: HEP Plan: Patient agrees to discharge.  Patient goals were partially met. Patient is being discharged due to not returning since the last visit.  ?????        KSigurd Sos PT 12/20/19 7:25 AM  [facility] [address]  [place], [place], [address] Phone: [phone]   Fax:  [phone]  Name: [name] MRN: [id number] Date of Birth: 01/21/1947

## 2019-12-04 ENCOUNTER — Encounter: Payer: Self-pay | Admitting: Physician Assistant

## 2019-12-04 ENCOUNTER — Telehealth: Payer: Self-pay | Admitting: *Deleted

## 2019-12-04 ENCOUNTER — Other Ambulatory Visit: Payer: Self-pay

## 2019-12-04 ENCOUNTER — Ambulatory Visit: Payer: Medicare HMO | Admitting: Physician Assistant

## 2019-12-04 VITALS — BP 126/80 | HR 68 | Temp 97.8°F | Ht 66.0 in | Wt 234.0 lb

## 2019-12-04 DIAGNOSIS — K219 Gastro-esophageal reflux disease without esophagitis: Secondary | ICD-10-CM | POA: Diagnosis not present

## 2019-12-04 DIAGNOSIS — R194 Change in bowel habit: Secondary | ICD-10-CM | POA: Diagnosis not present

## 2019-12-04 DIAGNOSIS — R1314 Dysphagia, pharyngoesophageal phase: Secondary | ICD-10-CM | POA: Diagnosis not present

## 2019-12-04 DIAGNOSIS — Z86010 Personal history of colonic polyps: Secondary | ICD-10-CM

## 2019-12-04 MED ORDER — OMEPRAZOLE 20 MG PO CPDR
20.0000 mg | DELAYED_RELEASE_CAPSULE | Freq: Two times a day (BID) | ORAL | 5 refills | Status: DC
Start: 2019-12-04 — End: 2020-09-01

## 2019-12-04 MED ORDER — NA SULFATE-K SULFATE-MG SULF 17.5-3.13-1.6 GM/177ML PO SOLN: 1.0000 | Freq: Once | ORAL | 0 refills | Status: AC

## 2019-12-04 NOTE — Progress Notes (Signed)
Chief Complaint: Follow-up constipation  HPI:    [name] is a 74 year old female with a past medical history as listed below including CAD status post stent placement 2018 on Plavix, known to Dr. Ardis Hughs, who returns to clinic today for follow-up of her constipation.    04/18/2015 colonoscopy Dr. Deatra Ina with sessile polyp in the ascending colon.  Pathology showed tubular adenoma and repeat was recommended in 5 years.    05/03/17 patient seen for constipation.  Described having a bowel movement every couple of days and never feeling empty with some pain on her right side as well as bloating.  That time discussed IBS-C possibly after C. difficile earlier that year.  Started the patient on MiraLAX.  Also encouraged fiber and water.    Today, the patient presents to clinic and tells me that she has continued with constipation as her normal and uses MiraLAX as needed, but over the past 3 to 4 months she has also had some issues with diarrhea after eating certain foods.  Red sauce is one of the things that she has noticed that bothers her the most.  If she eats something like this within 2 hours she will have lots of diarrhea and this will continue until "it is all out of me", sometimes this can last for a day or so.      Also having some breakthrough indigestion regardless of Pepcid 20 mg twice a day and reports issues with food/pills getting stuck in her throat on the way down.  Tells me she had a dilation years ago.    Recalls her history of polyps.    Denies fever, chills, weight loss, blood in her stool, nausea, vomiting or symptoms that awaken her from sleep.  Past Medical History:  Diagnosis Date  . Allergy   . Anxiety   . Arthritis    RA (Dr. Ouida Sills) Bilateral hands  . C. difficile diarrhea    history of  . CAD (coronary artery disease), native coronary artery    a. Cardiac CT with + FFR for Ramus b. cath 05/2017 100% sub acute lesion to mid circumflex artery s/p DES.  . Cancer ([place])    Right kidney CA removed.   . CKD (chronic kidney disease) stage 3, GFR 30-59 ml/min 10/11/2016   s/p R nephrectomy  . Colon polyps 2008   HYPERPLASTIC  . Costochondritis    a. Nuc Stress Test 6/16: EF 70, no scar or ischemia, low risk // b. Echo 3/16: mild conc LVH, EF 65-70, no RWMA, Gr 1 DD, mild TR  . Diabetes mellitus without complication ([place])    type 2  no meds  . Dyspnea   . Gastritis   . GERD (gastroesophageal reflux disease)   . History of echocardiogram    Echo 3/18:  Moderate LVH, EF [id number], grade 1 diastolic dysfunction, calcified aortic valve, mild MR, moderate LAE  . History of nuclear stress test    Myoview 3/18: Mod size and intensity fixed septal defect, may be artifact.Opposite mod size and intensity lat defect, which is reversible and could represent ischemia or possibly artifact (SDS 4). LVEF 71% with normal wall motion. Intermediate risk study. >> images reviewed with Dr. Dorris Carnes - no sig ischemia; med rx   . History of pneumonia   . Hx of cardiovascular stress test    Lexiscan Myoview 6/16:  EF 70%, no scar or ischemia; Low Risk  . Hypertension   . Neuromuscular disorder ([place])    Neruopathy  in bilateral feet  . Neuropathy   . Orthostatic hypotension 05/28/2017  . Osteoarthritis   . Pneumonia   . S/P angioplasty with stent 05/27/17 to LCX with DES  05/28/2017  . Thyroid disease     Past Surgical History:  Procedure Laterality Date  . ABDOMINAL HYSTERECTOMY  1978  . ANTERIOR CERVICAL DECOMP/DISCECTOMY FUSION N/A 05/29/2018   Procedure: ANTERIOR CERVICAL DECOMPRESSION FUSION - CERVICAL FIVE-CERVICAL SIX - CERVICAL SIX-CERVICAL SEVEN;  Surgeon: Earnie Larsson, MD;  Location: [facility];  Service: Neurosurgery;  Laterality: N/A;  . BACK SURGERY     x 2  . CARPAL TUNNEL RELEASE Left   . COLONOSCOPY W/ BIOPSIES AND POLYPECTOMY     Hx: of  . CORONARY STENT INTERVENTION N/A 05/27/2017   Procedure: CORONARY STENT INTERVENTION;  Surgeon: Burnell Blanks, MD;   Location: [facility];  Service: Cardiovascular;  Laterality: N/A;  . ESOPHAGOGASTRODUODENOSCOPY    . HNP    . LEFT HEART CATH AND CORONARY ANGIOGRAPHY N/A 05/27/2017   Procedure: LEFT HEART CATH AND CORONARY ANGIOGRAPHY;  Surgeon: Burnell Blanks, MD;  Location: [facility];  Service: Cardiovascular;  Laterality: N/A;  . LUMBAR LAMINECTOMY/DECOMPRESSION MICRODISCECTOMY Left 02/12/2013   Procedure: LUMBAR TWO THREE, LUMBAR THREE FOUR, LUMBAR FOUR FIVE  LAMINECTOMY/DECOMPRESSION MICRODISCECTOMY 3 LEVELS;  Surgeon: Charlie Pitter, MD;  Location: [facility];  Service: Neurosurgery;  Laterality: Left;  . NEPHRECTOMY Right 2010   10.rcc cancer  . TOTAL KNEE ARTHROPLASTY Right    Redo    Current Outpatient Medications  Medication Sig Dispense Refill  . acetaminophen (TYLENOL) 500 MG tablet Take 1,000 mg by mouth as needed for moderate pain or headache.     . albuterol (PROAIR HFA) 108 (90 Base) MCG/ACT inhaler Inhale 1-2 puffs into the lungs every 6 (six) hours as needed for wheezing or shortness of breath. 8 Blackwell 0  . aspirin 81 MG chewable tablet Chew 81 mg by mouth daily.    . clopidogrel (PLAVIX) 75 MG tablet TAKE ONE TABLET BY MOUTH ONE TIME DAILY  90 tablet 3  . colchicine 0.6 MG tablet Take 0.5 tablets (0.3 mg total) by mouth daily. 45 tablet 0  . diclofenac sodium (VOLTAREN) 1 % GEL Apply 2 Blackwell topically as needed (for arm pain).     [name] Kitchen doxycycline (VIBRAMYCIN) 100 MG capsule Take 1 capsule (100 mg total) by mouth 2 (two) times daily. 20 capsule 0  . DULoxetine (CYMBALTA) 30 MG capsule Take 1 capsule (30 mg total) by mouth daily. 90 capsule 1  . famotidine (PEPCID) 20 MG tablet TAKE 1 TABLET BY MOUTH TWICE DAILY AS NEEDED FOR HEARTBURN OR INDIGESTION 60 tablet 1  . fluticasone (FLONASE) 50 MCG/ACT nasal spray PLACE 2 SPRAYS INTO BOTH NOSTRILS AT BEDTIME AS NEEDED FOR ALLERGIES. (Patient taking differently: Place 2 sprays into both nostrils at bedtime. ) 48 mL 1  . gabapentin  (NEURONTIN) 100 MG capsule Take 1 capsule (100 mg total) by mouth 3 (three) times daily. 90 capsule 11  . gabapentin (NEURONTIN) 400 MG capsule Take 1 capsule (400 mg total) by mouth 2 (two) times daily. 180 capsule 4  . losartan (COZAAR) 100 MG tablet TAKE ONE TABLET BY MOUTH ONE TIME DAILY  90 tablet 0  . metoprolol tartrate (LOPRESSOR) 50 MG tablet TAKE ONE TABLET BY MOUTH TWICE DAILY  180 tablet 0  . Multiple Vitamin (MULTIVITAMIN) tablet Take 1 tablet by mouth daily.    . nitroGLYCERIN (NITROSTAT) 0.4 MG SL  tablet Place 1 tablet (0.4 mg total) under the tongue every 5 (five) minutes as needed for chest pain. 30 tablet 6  . polyethylene glycol powder (GLYCOLAX/MIRALAX) powder Take 17 Blackwell by mouth daily. (Patient taking differently: Take 17 Blackwell by mouth every other day. ) 500 Blackwell 3  . rosuvastatin (CRESTOR) 40 MG tablet Take 1 tablet (40 mg total) by mouth daily. 90 tablet 3  . triamterene-hydrochlorothiazide (MAXZIDE-25) 37.5-25 MG tablet Take 0.5 tablets by mouth daily. 45 tablet 3  . Vitamin D, Ergocalciferol, (DRISDOL) 1.25 MG (50000 UNIT) CAPS capsule Take 1 capsule by mouth every week x 8 weeks, and then every 2 weeks. 12 capsule 3   No current facility-administered medications for this visit.    Allergies as of 12/04/2019 - Review Complete 11/26/2019  Allergen Reaction Noted  . Oxycodone-acetaminophen Hives and Itching 11/04/2014  . Percocet [oxycodone-acetaminophen] Hives, Itching, and Other (See Comments) 11/04/2014  . Pravastatin sodium Other (See Comments) 05/03/2017  . Hydrocodone Other (See Comments) 12/04/2014  . Aspirin Other (See Comments)     Family History  Problem Relation Age of Onset  . Rheum arthritis Mother   . Stroke Mother   . Prostate cancer Father   . Heart disease Father   . Diabetes Brother   . Dementia Brother   . Parkinsonism Brother   . Kidney disease Son   . Diabetes Brother   . Cancer Brother   . Dementia Brother   . Colon cancer Neg Hx   .  Esophageal cancer Neg Hx   . Pancreatic cancer Neg Hx   . Liver disease Neg Hx     Social History   Socioeconomic History  . Marital status: Divorced    Spouse name: Not on file  . Number of children: 3  . Years of education: 18  . Highest education level: Not on file  Occupational History  . Occupation: Retired    Fish farm manager: RETIRED  Tobacco Use  . Smoking status: Current Every Day Smoker    Packs/day: 0.20    Years: 15.00    Pack years: 3.00    Types: Cigarettes  . Smokeless tobacco: Never Used  . Tobacco comment: 03/16/17  1 pk/week  Substance and Sexual Activity  . Alcohol use: No    Alcohol/week: 0.0 standard drinks  . Drug use: No  . Sexual activity: Not on file  Other Topics Concern  . Not on file  Social History Narrative   Single, dgtr lives with her   Never Smoked    Alcohol use- no   Drug use-no   Regular Exercise-yes   Former Smoker- 12/2008   Social Determinants of Health   Financial Resource Strain:   . Difficulty of Paying Living Expenses:   Food Insecurity:   . Worried About Charity fundraiser in the Last Year:   . Arboriculturist in the Last Year:   Transportation Needs:   . Film/video editor (Medical):   [name] Kitchen Lack of Transportation (Non-Medical):   Physical Activity:   . Days of Exercise per Week:   . Minutes of Exercise per Session:   Stress:   . Feeling of Stress :   Social Connections:   . Frequency of Communication with Friends and Family:   . Frequency of Social Gatherings with Friends and Family:   . Attends Religious Services:   . Active Member of Clubs or Organizations:   . Attends Archivist Meetings:   [name] Kitchen Marital Status:  Intimate Partner Violence:   . Fear of Current or Ex-Partner:   . Emotionally Abused:   [name] Kitchen Physically Abused:   . Sexually Abused:     Review of Systems:    Constitutional: No weight loss, fever or chills Cardiovascular: No chest pain  Respiratory: No SOB  Gastrointestinal: See HPI and  otherwise negative   Physical Exam:  Vital signs: BP 126/80   Pulse 68   Temp 97.8 F (36.6 C)   Ht 5\' 6"  (1.676 m)   Wt 234 lb (106.1 kg)   BMI 37.77 kg/m   Constitutional:   Pleasant overweight AA female appears to be in NAD, Well developed, Well nourished, alert and cooperative Respiratory: Respirations even and unlabored. Lungs clear to auscultation bilaterally.   No wheezes, crackles, or rhonchi.  Cardiovascular: Normal S1, S2. No MRG. Regular rate and rhythm. No peripheral edema, cyanosis or pallor.  Gastrointestinal:  Soft, nondistended, nontender. No rebound or guarding. Normal bowel sounds. No appreciable masses or hepatomegaly. Psychiatric:  Demonstrates good judgement and reason without abnormal affect or behaviors.  MOST RECENT LABS AND IMAGING: CBC    Component Value Date/Time   WBC 7.0 07/21/2018 1600   WBC 6.4 05/25/2018 1037   RBC 4.65 07/21/2018 1600   RBC 5.12 (H) 05/25/2018 1037   HGB 13.6 07/21/2018 1600   HCT 40.6 07/21/2018 1600   PLT 246 07/21/2018 1600   MCV 87 07/21/2018 1600   MCH 29.2 07/21/2018 1600   MCH 28.7 05/25/2018 1037   MCHC 33.5 07/21/2018 1600   MCHC 32.1 05/25/2018 1037   RDW 16.1 (H) 07/21/2018 1600   LYMPHSABS 2.2 06/01/2016 0839   MONOABS 0.5 06/01/2016 0839   EOSABS 0.5 06/01/2016 0839   BASOSABS 0.0 06/01/2016 0839    CMP     Component Value Date/Time   NA 140 09/26/2019 1302   NA 139 07/31/2019 1418   K 4.4 09/26/2019 1302   CL 106 09/26/2019 1302   CO2 27 09/26/2019 1302   GLUCOSE 77 09/26/2019 1302   BUN 21 09/26/2019 1302   BUN 20 07/31/2019 1418   CREATININE 1.23 (H) 09/26/2019 1302   CREATININE 1.18 (H) 10/01/2016 1622   CALCIUM 9.8 09/26/2019 1302   PROT 7.3 02/27/2019 1023   ALBUMIN 4.4 02/27/2019 1023   AST 21 02/27/2019 1023   ALT 11 02/27/2019 1023   ALKPHOS 101 02/27/2019 1023   BILITOT 0.4 02/27/2019 1023   GFRNONAA 39 (L) 07/31/2019 1418   GFRAA 45 (L) 07/31/2019 1418    Assessment: 1.  GERD:  Breakthrough regardless of Famotidine 20 mg twice daily 2.  Dysphagia: Pills and some food are getting stuck in her throat, last EGD in 2009 with a stricture and dilation; likely repeat stricture 3.  Change in bowel habits: Towards diarrhea episodes with certain foods; consider gastritis+/-IBS versus other 4.  History of adenomatous polyps: Last colonoscopy 04/2015 with recommendations to repeat in 5 years  Plan: 1.  Scheduled patient for an EGD with dilation and diagnostic colonoscopy given change in bowel habits.  These were scheduled with Dr. Ardis Hughs in the [facility].  Did discuss risks, benefits, limitations and alternatives and the patient agrees to proceed.  Patient has had both of her Covid vaccines the last greater than 2 weeks ago. 2.  Stop Famotidine.  Prescribed Omeprazole 20 mg twice daily, 30-60 minutes before breakfast and dinner.  #60 with 5 refills. 3.  Patient will hold her Plavix for 5 days prior to time of procedures.  We  will communicate with her prescribing physician to ensure that holding her Plavix is acceptable for her. 4.  Patient to follow in clinic per recommendations from Dr. Ardis Hughs after time of procedures.  Ellouise Newer, PA-C [facility] 12/04/2019, 10:28 AM  Cc: [name], Rose G, MD

## 2019-12-04 NOTE — Telephone Encounter (Signed)
[facility] Pre-operative Risk Assessment     Request for surgical clearance:     Endoscopy Procedure  What type of surgery is being performed?     EGD/Colonoscopy  When is this surgery scheduled?     Tuesday 01/15/20  What type of clearance is required ?   Pharmacy  Are there any medications that need to be held prior to surgery and how long? Plavix 5 days  Practice name and name of physician performing surgery?     Owens Loffler, MD [facility]  What is your office phone and fax number?      Phone- [phone]  [phone]  Anesthesia type (None, local, MAC, general) ?       MAC

## 2019-12-04 NOTE — Patient Instructions (Addendum)
If you are age 76 or older, your body mass index should be between 23-30. Your Body mass index is 37.77 kg/m. If this is out of the aforementioned range listed, please consider follow up with your Primary Care Provider.  If you are age 68 or younger, your body mass index should be between 19-25. Your Body mass index is 37.77 kg/m. If this is out of the aformentioned range listed, please consider follow up with your Primary Care Provider.   We have sent the following medications to your pharmacy for you to pick up at your convenience: Start Omeprazole 20 mg twice daily 30-60 minutes before breakfast and dinner.   Stop Famotidine.   You will be contacted by our office prior to your procedure for directions on holding your Plavix.  If you do not hear from our office 1 week prior to your scheduled procedure, please call [phone] to discuss.

## 2019-12-04 NOTE — Telephone Encounter (Signed)
Left VM for pt to call back.

## 2019-12-04 NOTE — Telephone Encounter (Signed)
OK to hold plavix prior to Colonscopy for 5 days   Resume when OK with GI

## 2019-12-04 NOTE — Progress Notes (Signed)
I agree with the above note, plan 

## 2019-12-05 NOTE — Telephone Encounter (Signed)
Left VM on patient's mobile number to callback.

## 2019-12-10 NOTE — Telephone Encounter (Signed)
   Primary Cardiologist: Dorris Carnes, MD  Chart reviewed as part of pre-operative protocol coverage. Patient was contacted 12/10/2019 in reference to pre-operative risk assessment for pending surgery as outlined below.  [name] was last seen on 07/2019 by Dr. Harrington Challenger. H/o CAD s/p PCI 2018, HTN, DM, HLD, CKD s/p nephrectomy, RA, OSA. RCRI calculated at 0.9% indicating low risk of CV complications. I was able to reach her on home phone. She denies any new cardiac symptoms. She does not exercise but is able to do ADLS without CP/dyspnea and states nothing has changed with her heart. She will call if any concerns arise.Therefore, based on ACC/AHA guidelines, the patient would be at acceptable risk for the planned procedure without further cardiovascular testing.   Per Dr. Harrington Challenger, "OK to hold plavix prior to Colonscopy for 5 days Resume when OK with GI"  I will route this recommendation to the requesting party via [facility] fax function and remove from pre-op pool.  Please call with questions.  Charlie Pitter, PA-C 12/10/2019, 10:33 AM

## 2019-12-17 ENCOUNTER — Other Ambulatory Visit: Payer: Self-pay | Admitting: Physician Assistant

## 2019-12-19 ENCOUNTER — Other Ambulatory Visit: Payer: Self-pay | Admitting: Family Medicine

## 2019-12-19 DIAGNOSIS — I119 Hypertensive heart disease without heart failure: Secondary | ICD-10-CM

## 2019-12-20 NOTE — Telephone Encounter (Signed)
Spoke with patient and informed to hold Plavix for 5 days.

## 2020-01-01 ENCOUNTER — Other Ambulatory Visit: Payer: Self-pay | Admitting: Family Medicine

## 2020-01-07 DIAGNOSIS — Z1231 Encounter for screening mammogram for malignant neoplasm of breast: Secondary | ICD-10-CM | POA: Diagnosis not present

## 2020-01-09 ENCOUNTER — Encounter: Payer: Self-pay | Admitting: Gastroenterology

## 2020-01-14 ENCOUNTER — Other Ambulatory Visit: Payer: Self-pay | Admitting: Family Medicine

## 2020-01-14 DIAGNOSIS — J069 Acute upper respiratory infection, unspecified: Secondary | ICD-10-CM

## 2020-01-15 ENCOUNTER — Encounter: Payer: Self-pay | Admitting: Gastroenterology

## 2020-01-31 ENCOUNTER — Telehealth: Payer: Self-pay | Admitting: Internal Medicine

## 2020-01-31 NOTE — Telephone Encounter (Signed)
    Went to chart to check when she can hold plavix. Per notes advised 5 days and advised to call her GI doctor to get more details. Pt agreed

## 2020-02-05 ENCOUNTER — Other Ambulatory Visit: Payer: Self-pay | Admitting: Rheumatology

## 2020-02-05 ENCOUNTER — Encounter: Payer: Self-pay | Admitting: Gastroenterology

## 2020-02-05 ENCOUNTER — Other Ambulatory Visit: Payer: Self-pay

## 2020-02-05 ENCOUNTER — Ambulatory Visit (AMBULATORY_SURGERY_CENTER): Payer: Medicare HMO | Admitting: Gastroenterology

## 2020-02-05 VITALS — BP 137/67 | HR 58 | Temp 96.4°F | Resp 11 | Ht 66.0 in | Wt 234.0 lb

## 2020-02-05 DIAGNOSIS — D124 Benign neoplasm of descending colon: Secondary | ICD-10-CM

## 2020-02-05 DIAGNOSIS — D123 Benign neoplasm of transverse colon: Secondary | ICD-10-CM

## 2020-02-05 DIAGNOSIS — R1314 Dysphagia, pharyngoesophageal phase: Secondary | ICD-10-CM | POA: Diagnosis not present

## 2020-02-05 DIAGNOSIS — Z86010 Personal history of colonic polyps: Secondary | ICD-10-CM

## 2020-02-05 DIAGNOSIS — K2970 Gastritis, unspecified, without bleeding: Secondary | ICD-10-CM

## 2020-02-05 DIAGNOSIS — Z1211 Encounter for screening for malignant neoplasm of colon: Secondary | ICD-10-CM | POA: Diagnosis not present

## 2020-02-05 DIAGNOSIS — R194 Change in bowel habit: Secondary | ICD-10-CM

## 2020-02-05 DIAGNOSIS — D122 Benign neoplasm of ascending colon: Secondary | ICD-10-CM | POA: Diagnosis not present

## 2020-02-05 DIAGNOSIS — K219 Gastro-esophageal reflux disease without esophagitis: Secondary | ICD-10-CM

## 2020-02-05 DIAGNOSIS — R1310 Dysphagia, unspecified: Secondary | ICD-10-CM | POA: Diagnosis not present

## 2020-02-05 MED ORDER — SODIUM CHLORIDE 0.9 % IV SOLN: 500.0000 mL | Freq: Once | INTRAVENOUS | Status: DC

## 2020-02-05 NOTE — Progress Notes (Signed)
Called to room to assist during endoscopic procedure.  Patient ID and intended procedure confirmed with present staff. Received instructions for my participation in the procedure from the performing physician.  

## 2020-02-05 NOTE — Patient Instructions (Signed)
HANDOUTS PROVIDED ON: GASTRITIS, POLYPS, & DIVERTICULOSIS  The polyps removed/biopsies taken today have been sent for pathology.  The results can take 1-3 weeks to receive.  When your next colonoscopy should occur will be based on the pathology results.    You may resume your previous diet (small bites and chew well) and medication schedule.  YOU MAY RESUME YOUR PLAVIX TOMORROW 02/06/20.  Thank you for allowing [place] to care for you today!!!   YOU HAD AN ENDOSCOPIC PROCEDURE TODAY AT [facility]:   Refer to the procedure report that was given to you for any specific questions about what was found during the examination.  If the procedure report does not answer your questions, please call your gastroenterologist to clarify.  If you requested that your care partner not be given the details of your procedure findings, then the procedure report has been included in a sealed envelope for you to review at your convenience later.  YOU SHOULD EXPECT: Some feelings of bloating in the abdomen. Passage of more gas than usual.  Walking can help get rid of the air that was put into your GI tract during the procedure and reduce the bloating. If you had a lower endoscopy (such as a colonoscopy or flexible sigmoidoscopy) you may notice spotting of blood in your stool or on the toilet paper. If you underwent a bowel prep for your procedure, you may not have a normal bowel movement for a few days.  Please Note:  You might notice some irritation and congestion in your nose or some drainage.  This is from the oxygen used during your procedure.  There is no need for concern and it should clear up in a day or so.  SYMPTOMS TO REPORT IMMEDIATELY:   Following lower endoscopy (colonoscopy or flexible sigmoidoscopy):  Excessive amounts of blood in the stool  Significant tenderness or worsening of abdominal pains  Swelling of the abdomen that is new, acute  Fever of 100F or higher   Following upper  endoscopy (EGD)  Vomiting of blood or coffee ground material  New chest pain or pain under the shoulder blades  Painful or persistently difficult swallowing  New shortness of breath  Fever of 100F or higher  Black, tarry-looking stools  For urgent or emergent issues, a gastroenterologist can be reached at any hour by calling [phone]. Do not use MyChart messaging for urgent concerns.    DIET:  We do recommend a small meal at first, but then you may proceed to your regular diet.  Drink plenty of fluids but you should avoid alcoholic beverages for 24 hours.  ACTIVITY:  You should plan to take it easy for the rest of today and you should NOT DRIVE or use heavy machinery until tomorrow (because of the sedation medicines used during the test).    FOLLOW UP: Our staff will call the number listed on your records 48-72 hours following your procedure to check on you and address any questions or concerns that you may have regarding the information given to you following your procedure. If we do not reach you, we will leave a message.  We will attempt to reach you two times.  During this call, we will ask if you have developed any symptoms of COVID 19. If you develop any symptoms (ie: fever, flu-like symptoms, shortness of breath, cough etc.) before then, please call [phone].  If you test positive for Covid 19 in the 2 weeks post procedure, please call and  report this information to [place].    If any biopsies were taken you will be contacted by phone or by letter within the next 1-3 weeks.  Please call us at [phone] if you have not heard about the biopsies in 3 weeks.    SIGNATURES/CONFIDENTIALITY: You and/or your care partner have signed paperwork which will be entered into your electronic medical record.  These signatures attest to the fact that that the information above on your After Visit Summary has been reviewed and is understood.  Full responsibility of the confidentiality of this  discharge information lies with you and/or your care-partner.

## 2020-02-05 NOTE — Progress Notes (Signed)
Lidocaine buffer  robinol antisialogogue 

## 2020-02-05 NOTE — Progress Notes (Signed)
A and O x3. Report to RN. Tolerated MAC anesthesia well.Teeth unchanged after procedure.

## 2020-02-05 NOTE — Op Note (Signed)
[place] Patient Name: [name] Procedure Date: 02/05/2020 1:10 PM MRN: [id number] Endoscopist: Milus Banister , MD Age: 74 Referring MD:  Date of Birth: 01/21/1947 Gender: Female Account #: [id number] Procedure:                Colonoscopy Indications:              High risk colon cancer surveillance: Personal                            history of colonic polyps Medicines:                Monitored Anesthesia Care Procedure:                Pre-Anesthesia Assessment:                           - Prior to the procedure, a History and Physical                            was performed, and patient medications and                            allergies were reviewed. The patient's tolerance of                            previous anesthesia was also reviewed. The risks                            and benefits of the procedure and the sedation                            options and risks were discussed with the patient.                            All questions were answered, and informed consent                            was obtained. Prior Anticoagulants: The patient has                            taken Plavix (clopidogrel), last dose was 5 days                            prior to procedure. ASA Grade Assessment: III - A                            patient with severe systemic disease. After                            reviewing the risks and benefits, the patient was                            deemed in satisfactory condition to undergo the  procedure.                           After obtaining informed consent, the colonoscope                            was passed under direct vision. Throughout the                            procedure, the patient's blood pressure, pulse, and                            oxygen saturations were monitored continuously. The                            Colonoscope was introduced through the anus and                             advanced to the the cecum, identified by                            appendiceal orifice and ileocecal valve. The                            colonoscopy was performed without difficulty. The                            patient tolerated the procedure well. The quality                            of the bowel preparation was good. Scope In: 1:26:07 PM Scope Out: 1:40:53 PM Scope Withdrawal Time: 0 hours 7 minutes 9 seconds  Total Procedure Duration: 0 hours 14 minutes 46 seconds  Findings:                 Five sessile polyps were found in the descending                            colon, transverse colon and ascending colon. The                            polyps were 2 to 6 mm in size. These polyps were                            removed with a cold snare. Resection and retrieval                            were complete.                           Multiple small and large-mouthed diverticula were                            found in the left colon.  The exam was otherwise without abnormality on                            direct and retroflexion views. Complications:            No immediate complications. Estimated blood loss:                            None. Estimated Blood Loss:     Estimated blood loss: none. Impression:               - Five 2 to 6 mm polyps in the descending colon, in                            the transverse colon and in the ascending colon,                            removed with a cold snare. Resected and retrieved.                           - Diverticulosis in the left colon.                           - The examination was otherwise normal on direct                            and retroflexion views. Recommendation:           - EGD now.                           - Await pathology results. Milus Banister, MD 02/05/2020 1:48:50 PM This report has been signed electronically.

## 2020-02-05 NOTE — Op Note (Signed)
[place] Patient Name: [name] Procedure Date: 02/05/2020 1:10 PM MRN: [id number] Endoscopist: Milus Banister , MD Age: 74 Referring MD:  Date of Birth: 01/21/1947 Gender: Female Account #: [id number] Procedure:                Upper GI endoscopy Indications:              Dysphagia, Heartburn Medicines:                Monitored Anesthesia Care Procedure:                Pre-Anesthesia Assessment:                           - Prior to the procedure, a History and Physical                            was performed, and patient medications and                            allergies were reviewed. The patient's tolerance of                            previous anesthesia was also reviewed. The risks                            and benefits of the procedure and the sedation                            options and risks were discussed with the patient.                            All questions were answered, and informed consent                            was obtained. Prior Anticoagulants: The patient has                            taken Plavix (clopidogrel), last dose was 5 days                            prior to procedure. ASA Grade Assessment: III - A                            patient with severe systemic disease. After                            reviewing the risks and benefits, the patient was                            deemed in satisfactory condition to undergo the                            procedure.  After obtaining informed consent, the endoscope was                            passed under direct vision. Throughout the                            procedure, the patient's blood pressure, pulse, and                            oxygen saturations were monitored continuously. The                            Endoscope was introduced through the mouth, and                            advanced to the second part of duodenum. The upper                             GI endoscopy was accomplished without difficulty.                            The patient tolerated the procedure well. Scope In: Scope Out: Findings:                 Mild inflammation characterized by erythema,                            friability and granularity was found in the entire                            examined stomach. Biopsies were taken with a cold                            forceps for histology.                           The exam was otherwise without abnormality. Complications:            No immediate complications. Estimated blood loss:                            None. Estimated Blood Loss:     Estimated blood loss: none. Impression:               - Gastritis. Biopsied to check for H. pylori.                           - The examination was otherwise normal. Recommendation:           - Patient has a contact number available for                            emergencies. The signs and symptoms of potential                            delayed complications were discussed with the  patient. Return to normal activities tomorrow.                            Written discharge instructions were provided to the                            patient.                           - Resume previous diet. Eat slowly, take small                            bites and chew your food well.                           - Continue present medications. OK to resume your                            plavix tomorrow.                           - Await pathology results. Milus Banister, MD 02/05/2020 1:51:54 PM This report has been signed electronically.

## 2020-02-05 NOTE — Progress Notes (Signed)
VS by CW  Pt's states no medical or surgical changes since previsit or office visit.  

## 2020-02-07 ENCOUNTER — Telehealth: Payer: Self-pay | Admitting: *Deleted

## 2020-02-07 NOTE — Telephone Encounter (Signed)
  Follow up Call-  Call back number 02/05/2020  Post procedure Call Back phone  # [phone]  Permission to leave phone message Yes  Some recent data might be hidden     Patient questions:  Do you have a fever, pain , or abdominal swelling? No. Pain Score  0 *  Have you tolerated food without any problems? Yes.    Have you been able to return to your normal activities? Yes.    Do you have any questions about your discharge instructions: Diet   No. Medications  No. Follow up visit  No.  Do you have questions or concerns about your Care? No.  Actions: * If pain score is 4 or above: No action needed, pain <4.  1. Have you developed a fever since your procedure? no  2.   Have you had an respiratory symptoms (SOB or cough) since your procedure? no  3.   Have you tested positive for COVID 19 since your procedure no  4.   Have you had any family members/close contacts diagnosed with the COVID 19 since your procedure?  no   If yes to any of these questions please route to Joylene John, RN and Erenest Rasher, RN

## 2020-02-07 NOTE — Telephone Encounter (Signed)
Attempted f/u phone call. No answer. Left message. °

## 2020-02-15 ENCOUNTER — Encounter: Payer: Self-pay | Admitting: Gastroenterology

## 2020-02-18 ENCOUNTER — Other Ambulatory Visit: Payer: Self-pay | Admitting: Physician Assistant

## 2020-02-19 ENCOUNTER — Other Ambulatory Visit: Payer: Self-pay | Admitting: Family Medicine

## 2020-02-19 DIAGNOSIS — I119 Hypertensive heart disease without heart failure: Secondary | ICD-10-CM

## 2020-02-22 ENCOUNTER — Telehealth: Payer: Self-pay | Admitting: Gastroenterology

## 2020-02-22 NOTE — Telephone Encounter (Signed)
The pt states she has gas pains when eating.  She states when she passes gas she gets relief.  Stools are somewhat hard and not regular.  She was advised to begin miralax daily and take gas ex three times a day before meals and call back if that does not resolve her symptoms. The pt has been advised of the information and verbalized understanding.

## 2020-02-23 ENCOUNTER — Other Ambulatory Visit: Payer: Self-pay

## 2020-02-23 ENCOUNTER — Encounter (HOSPITAL_COMMUNITY): Payer: Self-pay

## 2020-02-23 ENCOUNTER — Ambulatory Visit (HOSPITAL_COMMUNITY)
Admission: EM | Admit: 2020-02-23 | Discharge: 2020-02-23 | Disposition: A | Discharge status: Home / Self Care | Payer: Medicare HMO | Attending: Emergency Medicine | Admitting: Emergency Medicine

## 2020-02-23 DIAGNOSIS — Z905 Acquired absence of kidney: Secondary | ICD-10-CM | POA: Insufficient documentation

## 2020-02-23 DIAGNOSIS — R109 Unspecified abdominal pain: Secondary | ICD-10-CM | POA: Diagnosis not present

## 2020-02-23 DIAGNOSIS — R809 Proteinuria, unspecified: Secondary | ICD-10-CM

## 2020-02-23 LAB — COMPREHENSIVE METABOLIC PANEL
ALT: 14 U/L (ref 0–44)
AST: 22 U/L (ref 15–41)
Albumin: 3.5 g/dL (ref 3.5–5.0)
Alkaline Phosphatase: 67 U/L (ref 38–126)
Anion gap: 9 (ref 5–15)
BUN: 20 mg/dL (ref 8–23)
CO2: 26 mmol/L (ref 22–32)
Calcium: 9.5 mg/dL (ref 8.9–10.3)
Chloride: 105 mmol/L (ref 98–111)
Creatinine, Ser: 1.52 mg/dL — ABNORMAL HIGH (ref 0.44–1.00)
GFR calc Af Amer: 38 mL/min — ABNORMAL LOW (ref >60)
GFR calc non Af Amer: 33 mL/min — ABNORMAL LOW (ref >60)
Glucose, Bld: 81 mg/dL (ref 70–99)
Potassium: 4.3 mmol/L (ref 3.5–5.1)
Sodium: 140 mmol/L (ref 135–145)
Total Bilirubin: 0.7 mg/dL (ref 0.3–1.2)
Total Protein: 7.8 g/dL (ref 6.5–8.1)

## 2020-02-23 LAB — POCT URINALYSIS DIP (DEVICE)
Bilirubin Urine: NEGATIVE
Glucose, UA: NEGATIVE mg/dL
Hgb urine dipstick: NEGATIVE
Ketones, ur: NEGATIVE mg/dL
Leukocytes,Ua: NEGATIVE
Nitrite: NEGATIVE
Protein, ur: 30 mg/dL — AB
Specific Gravity, Urine: 1.02 (ref 1.005–1.030)
Urobilinogen, UA: 0.2 mg/dL (ref 0.0–1.0)
pH: 5.5 (ref 5.0–8.0)

## 2020-02-23 LAB — CBC
HCT: 46.4 % — ABNORMAL HIGH (ref 36.0–46.0)
Hemoglobin: 14.9 g/dL (ref 12.0–15.0)
MCH: 30.2 pg (ref 26.0–34.0)
MCHC: 32.1 g/dL (ref 30.0–36.0)
MCV: 93.9 fL (ref 80.0–100.0)
Platelets: 242 10*3/uL (ref 150–400)
RBC: 4.94 MIL/uL (ref 3.87–5.11)
RDW: 14.6 % (ref 11.5–15.5)
WBC: 7.5 10*3/uL (ref 4.0–10.5)
nRBC: 0 % (ref 0.0–0.2)

## 2020-02-23 NOTE — ED Triage Notes (Signed)
Pt presents to UC for left sided flank pain. Pt endorsing urinary frequency. Pt denies burning or discomfort with urination, or foul smelling urine. Pt also endorsing constipation, relieved by miralax. Pt denies fever, chills, n/v.

## 2020-02-23 NOTE — Discharge Instructions (Signed)
You urine doesn't have any obvious concern today in regards to your kidney, we will check a few lab tests to further evaluate this. I would call you this evening once its completed if I am worried about.  Drink plenty of water, light and regular activity as tolerated.  Tylenol regularly for pain. I do feel this is likely muscular in nature.  Please follow up with your nephrologist and/or PCP if this persists as you may need imaging.  If worsening- fevers, increased pain, unable to urinate, blood in urine, or otherwise worsening please go to the ER.

## 2020-02-24 NOTE — ED Provider Notes (Signed)
[facility]    CSN: [id number] Arrival date & time: 02/23/20  1236      History   Chief Complaint Chief Complaint  Patient presents with  . Flank Pain    HPI [name] is a 74 y.o. female.   [name] presents with complaints of left flank cramping sensation which started yesterday. She thought it was gas so took gasx and metamucil which hasn't seemed to help. She had a normal bowel movement this morning which didn't necessarily help. She has occasional urinary urgency, this comes and goes. She is concerned about her kidney as she has had a tumor followed by right nephrectomy, and now with solo left kidney. No fevers. Movement does seem to increase the pain. She had done some increased activity related to gardening and a snake, prior to onset of symptoms. No abdominal pain or pelvic pain. No radiation of pain to buttock or thigh. Colonoscopy earlier this month with polyps found.     ROS per HPI, negative if not otherwise mentioned.      Past Medical History:  Diagnosis Date  . Allergy   . Anxiety   . Arthritis    RA (Dr. Ouida Sills) Bilateral hands  . C. difficile diarrhea    history of  . CAD (coronary artery disease), native coronary artery    a. Cardiac CT with + FFR for Ramus b. cath 05/2017 100% sub acute lesion to mid circumflex artery s/p DES.  . Cancer ([place])    Right kidney CA removed.   . CKD (chronic kidney disease) stage 3, GFR 30-59 ml/min 10/11/2016   s/p R nephrectomy  . Colon polyps 2008   HYPERPLASTIC  . Costochondritis    a. Nuc Stress Test 6/16: EF 70, no scar or ischemia, low risk // b. Echo 3/16: mild conc LVH, EF 65-70, no RWMA, Gr 1 DD, mild TR  . Diabetes mellitus without complication ([place])    type 2  no meds  . Dyspnea   . Gastritis   . GERD (gastroesophageal reflux disease)   . History of echocardiogram    Echo 3/18:  Moderate LVH, EF 51-02, grade 1 diastolic dysfunction, calcified aortic valve, mild MR, moderate LAE  .  History of nuclear stress test    Myoview 3/18: Mod size and intensity fixed septal defect, may be artifact.Opposite mod size and intensity lat defect, which is reversible and could represent ischemia or possibly artifact (SDS 4). LVEF 71% with normal wall motion. Intermediate risk study. >> images reviewed with Dr. Dorris Carnes - no sig ischemia; med rx   . History of pneumonia   . Hx of cardiovascular stress test    Lexiscan Myoview 6/16:  EF 70%, no scar or ischemia; Low Risk  . Hyperlipidemia   . Hypertension   . Neuromuscular disorder ([place])    Neruopathy in bilateral feet  . Neuropathy   . Orthostatic hypotension 05/28/2017  . Osteoarthritis   . Pneumonia   . S/P angioplasty with stent 05/27/17 to LCX with DES  05/28/2017  . Thyroid disease     Patient Active Problem List   Diagnosis Date Noted  . Malignant neoplasm of kidney excluding renal pelvis, unspecified laterality ([place]) 09/26/2019  . Vitamin D deficiency, unspecified 09/26/2019  . Anxiety disorder, unspecified 03/26/2019  . Tobacco use disorder 09/29/2018  . Cervical myelopathy ([place]) 05/29/2018  . Cervicalgia 09/16/2017  . Orthostatic hypotension 05/28/2017  . S/P angioplasty with stent 05/27/17 to LCX with DES  05/28/2017  .  CAD (coronary artery disease)   . Polyneuropathy 05/25/2017  . Chest pain with moderate risk for cardiac etiology 05/25/2017  . Class 1 obesity with serious comorbidity and body mass index (BMI) of 34.0 to 34.9 in adult 12/06/2016  . CKD (chronic kidney disease) stage 3, GFR 30-59 ml/min 10/11/2016  . Hot flashes, menopausal 10/01/2016  . Chest pain with moderate risk of acute coronary syndrome 10/25/2014  . Degenerative spondylolisthesis 10/15/2014  . Spinal stenosis, lumbar region, with neurogenic claudication 02/12/2013  . DM (diabetes mellitus), type 2 with renal complications ([place]) 96/75/9163  . History of Rtr nephrectomy 2010, secondary to renal cell cancer 01/14/2009  . PERSONAL HX COLONIC  POLYPS 11/14/2008  . Hyperlipidemia associated with type 2 diabetes mellitus ([place]) 11/16/2007  . ESOPHAGEAL STRICTURE 10/12/2007  . Rheumatoid arthritis ([place]) 02/27/2007  . Hypertension with heart disease 01/19/2007  . Allergic rhinitis 01/19/2007  . GERD 01/19/2007    Past Surgical History:  Procedure Laterality Date  . ABDOMINAL HYSTERECTOMY  1978  . ANTERIOR CERVICAL DECOMP/DISCECTOMY FUSION N/A 05/29/2018   Procedure: ANTERIOR CERVICAL DECOMPRESSION FUSION - CERVICAL FIVE-CERVICAL SIX - CERVICAL SIX-CERVICAL SEVEN;  Surgeon: Earnie Larsson, MD;  Location: [facility];  Service: Neurosurgery;  Laterality: N/A;  . BACK SURGERY     x 2  . CARPAL TUNNEL RELEASE Left   . COLONOSCOPY W/ BIOPSIES AND POLYPECTOMY     Hx: of  . CORONARY STENT INTERVENTION N/A 05/27/2017   Procedure: CORONARY STENT INTERVENTION;  Surgeon: Burnell Blanks, MD;  Location: [facility];  Service: Cardiovascular;  Laterality: N/A;  . ESOPHAGOGASTRODUODENOSCOPY    . HNP    . LEFT HEART CATH AND CORONARY ANGIOGRAPHY N/A 05/27/2017   Procedure: LEFT HEART CATH AND CORONARY ANGIOGRAPHY;  Surgeon: Burnell Blanks, MD;  Location: [facility];  Service: Cardiovascular;  Laterality: N/A;  . LUMBAR LAMINECTOMY/DECOMPRESSION MICRODISCECTOMY Left 02/12/2013   Procedure: LUMBAR TWO THREE, LUMBAR THREE FOUR, LUMBAR FOUR FIVE  LAMINECTOMY/DECOMPRESSION MICRODISCECTOMY 3 LEVELS;  Surgeon: Charlie Pitter, MD;  Location: [facility];  Service: Neurosurgery;  Laterality: Left;  . NEPHRECTOMY Right 2010   10.rcc cancer  . TOTAL KNEE ARTHROPLASTY Right    Redo    OB History   No obstetric history on file.      Home Medications    Prior to Admission medications   Medication Sig Start Date End Date Taking? Authorizing Provider  acetaminophen (TYLENOL) 500 MG tablet Take 1,000 mg by mouth as needed for moderate pain or headache.     [provider]  albuterol (PROAIR HFA) 108 (90 Base) MCG/ACT  inhaler Inhale 1-2 puffs into the lungs every 6 (six) hours as needed for wheezing or shortness of breath. 10/21/19   Zigmund Gottron, NP  aspirin 81 MG chewable tablet Chew 81 mg by mouth daily.    [provider]  clopidogrel (PLAVIX) 75 MG tablet take one tablet by mouth one time daily 02/18/20   Fay Records, MD  colchicine 0.6 MG tablet Take 0.5 tablets (0.3 mg total) by mouth daily. 06/19/19   Bo Merino, MD  diclofenac sodium (VOLTAREN) 1 % GEL Apply 2 g topically as needed (for arm pain).     [provider]  DULoxetine (CYMBALTA) 30 MG capsule Take 1 capsule (30 mg total) by mouth daily. 09/26/19   [name], [name], MD  famotidine (PEPCID) 20 MG tablet TAKE 1 TABLET BY MOUTH TWICE DAILY AS NEEDED FOR HEARTBURN OR INDIGESTION Patient not  taking: Reported on 02/05/2020 01/01/20   [name], [name], MD  fluticasone (FLONASE) 50 MCG/ACT nasal spray PLACE 2 SPRAYS INTO BOTH NOSTRILS AT BEDTIME AS NEEDED FOR ALLERGIES. 01/14/20   [name], [name], MD  gabapentin (NEURONTIN) 100 MG capsule Take 1 capsule (100 mg total) by mouth 3 (three) times daily. 08/30/19   Ward Givens, NP  gabapentin (NEURONTIN) 400 MG capsule Take 1 capsule (400 mg total) by mouth 2 (two) times daily. 11/21/18   Penumalli, Earlean Polka, MD  losartan (COZAAR) 100 MG tablet TAKE ONE TABLET BY MOUTH ONE TIME DAILY 02/19/20   [name], [name], MD  metoprolol tartrate (LOPRESSOR) 50 MG tablet TAKE ONE TABLET BY MOUTH TWICE DAILY  12/19/19   [name], [name], MD  Multiple Vitamin (MULTIVITAMIN) tablet Take 1 tablet by mouth daily. Patient not taking: Reported on 02/05/2020    [provider]  nitroGLYCERIN (NITROSTAT) 0.4 MG SL tablet Place 1 tablet (0.4 mg total) under the tongue every 5 (five) minutes as needed for chest pain. 07/09/19   Fay Records, MD  omeprazole (PRILOSEC) 20 MG capsule Take 1 capsule (20 mg total) by mouth 2 (two) times daily before a meal. 12/04/19   [place]mon, Lavone Nian, PA  polyethylene  glycol powder (GLYCOLAX/MIRALAX) powder Take 17 g by mouth daily. Patient taking differently: Take 17 g by mouth every other day.  05/03/17   Levin Erp, PA  rosuvastatin (CRESTOR) 40 MG tablet Take 1 tablet (40 mg total) by mouth daily. 09/14/18   Daune Perch, NP  triamterene-hydrochlorothiazide (MAXZIDE-25) 37.5-25 MG tablet Take 0.5 tablets by mouth daily. 07/09/19   Fay Records, MD  Vitamin D, Ergocalciferol, (DRISDOL) 1.25 MG (50000 UNIT) CAPS capsule Take 1 capsule by mouth every week x 8 weeks, and then every 2 weeks. Patient not taking: Reported on 02/05/2020 10/03/19   [name], [name], MD    Family History Family History  Problem Relation Age of Onset  . Rheum arthritis Mother   . Stroke Mother   . Prostate cancer Father   . Heart disease Father   . Diabetes Brother   . Dementia Brother   . Parkinsonism Brother   . Kidney disease Son   . Diabetes Brother   . Cancer Brother   . Dementia Brother   . Colon cancer Neg Hx   . Esophageal cancer Neg Hx   . Pancreatic cancer Neg Hx   . Liver disease Neg Hx   . Rectal cancer Neg Hx   . Stomach cancer Neg Hx     Social History Social History   Tobacco Use  . Smoking status: Current Every Day Smoker    Packs/day: 0.20    Years: 15.00    Pack years: 3.00    Types: Cigarettes  . Smokeless tobacco: Never Used  . Tobacco comment: 03/16/17  1 pk/week  Vaping Use  . Vaping Use: Never used  Substance Use Topics  . Alcohol use: No    Alcohol/week: 0.0 standard drinks  . Drug use: No     Allergies   Oxycodone-acetaminophen, Percocet [oxycodone-acetaminophen], Pravastatin sodium, Hydrocodone, and Aspirin   Review of Systems Review of Systems   Physical Exam Triage Vital Signs ED Triage Vitals  Enc Vitals Group     BP 02/23/20 1419 119/73     Pulse Rate 02/23/20 1416 61     Resp 02/23/20 1416 16     Temp 02/23/20 1416 98.2 F (36.8 C)     Temp Source  02/23/20 1416 Oral     SpO2 02/23/20 1416 96 %      Weight --      Height --      Head Circumference --      Peak Flow --      Pain Score 02/23/20 1418 5     Pain Loc --      Pain Edu? --      Excl. in [place]? --    No data found.  Updated Vital Signs BP 119/73 (BP Location: Left Arm)   Pulse 61   Temp 98.2 F (36.8 C) (Oral)   Resp 16   SpO2 96%    Physical Exam Constitutional:      General: She is not in acute distress.    Appearance: She is well-developed.  Cardiovascular:     Rate and Rhythm: Normal rate.  Pulmonary:     Effort: Pulmonary effort is normal.  Abdominal:     Tenderness: There is no right CVA tenderness or left CVA tenderness.  Musculoskeletal:       Back:     Comments: Point tenderness to left lateral mid back on palpation with specific CVA tenderness, pain worse with movement of trunk   Skin:    General: Skin is warm and dry.  Neurological:     Mental Status: She is alert and oriented to person, place, and time.      UC Treatments / Results  Labs (all labs ordered are listed, but only abnormal results are displayed) Labs Reviewed  COMPREHENSIVE METABOLIC PANEL - Abnormal; Notable for the following components:      Result Value   Creatinine, Ser 1.52 (*)    GFR calc non Af Amer 33 (*)    GFR calc Af Amer 38 (*)    All other components within normal limits  CBC - Abnormal; Notable for the following components:   HCT 46.4 (*)    All other components within normal limits  POCT URINALYSIS DIP (DEVICE) - Abnormal; Notable for the following components:   Protein, ur 30 (*)    All other components within normal limits    EKG   Radiology No results found.  Procedures Procedures (including critical care time)  Medications Ordered in UC Medications - No data to display  Initial Impression / Assessment and Plan / UC Course  I have reviewed the triage vital signs and the nursing notes.  Pertinent labs & imaging results that were available during my care of the patient were reviewed by me and  considered in my medical decision making (see chart for details).     Urine with proteinuria which appears to be likely baseline for patient. History of right nephrectomy related to kidney tumor. CMP and CBC collected today as well as she is concerned about her left kidney. Slight increase in creatinine from most recent result, but this is similar to results in December 2020, hydration encouraged. Encouraged recheck with pcp. Tylenol as needed for pain, suspect musculoskeletal pain related to gardening incident. Return precautions provided. Patient verbalized understanding and agreeable to plan.   Final Clinical Impressions(s) / UC Diagnoses   Final diagnoses:  Flank pain  History of kidney removal  Proteinuria, unspecified type     Discharge Instructions     You urine doesn't have any obvious concern today in regards to your kidney, we will check a few lab tests to further evaluate this. I would call you this evening once its completed if I am worried  about.  Drink plenty of water, light and regular activity as tolerated.  Tylenol regularly for pain. I do feel this is likely muscular in nature.  Please follow up with your nephrologist and/or PCP if this persists as you may need imaging.  If worsening- fevers, increased pain, unable to urinate, blood in urine, or otherwise worsening please go to the ER.    ED Prescriptions    None     PDMP not reviewed this encounter.   Zigmund Gottron, NP 02/24/20 [phone]

## 2020-02-26 ENCOUNTER — Telehealth (HOSPITAL_COMMUNITY): Payer: Self-pay | Admitting: Emergency Medicine

## 2020-02-26 NOTE — Telephone Encounter (Signed)
Per recent blood tests, there is worsening of patient's kidney function.  Nothing of high concern on results at this time.  Wanted to check in on patient and be sure she is hydrating and she remembers to follow up with PCP/Nephrologist, which was encouraged at the time of her visit.    Attempted to call patient, no answer, LVM

## 2020-02-27 ENCOUNTER — Other Ambulatory Visit: Payer: Self-pay | Admitting: Family Medicine

## 2020-02-27 DIAGNOSIS — I119 Hypertensive heart disease without heart failure: Secondary | ICD-10-CM

## 2020-02-29 DIAGNOSIS — R944 Abnormal results of kidney function studies: Secondary | ICD-10-CM | POA: Diagnosis not present

## 2020-02-29 DIAGNOSIS — Z85528 Personal history of other malignant neoplasm of kidney: Secondary | ICD-10-CM | POA: Diagnosis not present

## 2020-02-29 DIAGNOSIS — N3941 Urge incontinence: Secondary | ICD-10-CM | POA: Diagnosis not present

## 2020-02-29 DIAGNOSIS — N3281 Overactive bladder: Secondary | ICD-10-CM | POA: Diagnosis not present

## 2020-03-03 ENCOUNTER — Encounter: Payer: Self-pay | Admitting: Adult Health

## 2020-03-03 ENCOUNTER — Ambulatory Visit: Payer: Medicare HMO | Admitting: Adult Health

## 2020-03-03 VITALS — BP 123/74 | HR 72 | Ht 66.0 in | Wt 236.0 lb

## 2020-03-03 DIAGNOSIS — G6289 Other specified polyneuropathies: Secondary | ICD-10-CM

## 2020-03-03 MED ORDER — GABAPENTIN 400 MG PO CAPS: 400.0000 mg | ORAL_CAPSULE | Freq: Two times a day (BID) | ORAL | 4 refills | Status: DC

## 2020-03-03 MED ORDER — GABAPENTIN 100 MG PO CAPS: 100.0000 mg | ORAL_CAPSULE | Freq: Three times a day (TID) | ORAL | 11 refills | Status: DC

## 2020-03-03 NOTE — Progress Notes (Signed)
PATIENT: [name] DOB: 01/21/1947  REASON FOR VISIT: follow up HISTORY FROM: patient  HISTORY OF PRESENT ILLNESS: Today 03/03/20:  [name] is a 74 year old female with a history of neuropathy.  She returns today for follow-up.  She reports that her pain is under relatively good control.  She continues to have numbness in the hands and the feet.  She is currently taking gabapentin 500 mg at noon, 100 mg at dinnertime and 500 mg at bedtime.  She denies any significant changes with her gait or balance.  Does report that she gets off balance at times.  She returns today for evaluation.  HISTORY 08/30/19:  [name] is a 74 year old female with a history of neuropathy.  She returns today for follow-up.  She states that she is having burning sensations in both hands.  She states that on occasion she will have sharp shooting pains in the feet and numbness.  The hands is what bothers her the most.  She is currently taking gabapentin 500 mg at 12 PM and again at 10 PM.  She denies any significant changes with her balance.  She states that she may feel off balance at times but denies any falls.  She was able to get the compounded cream and tries to use it at night.  She states that she cannot use it much during the day because she is constantly washing dishes or cleaning and the water washes it off.  In the past she has been diagnosed with a left carpal tunnel syndrome.  She reports that she did see a hand surgeon but he felt that most of her symptoms was due to neuropathy.  She returns today for an evaluation.  REVIEW OF SYSTEMS: Out of a complete 14 system review of symptoms, the patient complains only of the following symptoms, and all other reviewed systems are negative.  See HPI  ALLERGIES: Allergies  Allergen Reactions  . Oxycodone-Acetaminophen Hives and Itching    hallucinations  . Percocet [Oxycodone-Acetaminophen] Hives, Itching and Other (See Comments)    hallucinations  .  Pravastatin Sodium Other (See Comments)    Muscle aches Muscle aches  . Hydrocodone Other (See Comments)    "crazy dreams"  . Aspirin Other (See Comments)    GI upset Other reaction(s): Other (See Comments) REACTION: GI upset    HOME MEDICATIONS: Outpatient Medications Prior to Visit  Medication Sig Dispense Refill  . acetaminophen (TYLENOL) 500 MG tablet Take 1,000 mg by mouth as needed for moderate pain or headache.     . albuterol (PROAIR HFA) 108 (90 Base) MCG/ACT inhaler Inhale 1-2 puffs into the lungs every 6 (six) hours as needed for wheezing or shortness of breath. 8 g 0  . aspirin 81 MG chewable tablet Chew 81 mg by mouth daily.    . clopidogrel (PLAVIX) 75 MG tablet take one tablet by mouth one time daily 90 tablet 1  . colchicine 0.6 MG tablet Take 0.5 tablets (0.3 mg total) by mouth daily. 45 tablet 0  . diclofenac sodium (VOLTAREN) 1 % GEL Apply 2 g topically as needed (for arm pain).     . DULoxetine (CYMBALTA) 30 MG capsule Take 1 capsule (30 mg total) by mouth daily. 90 capsule 1  . fluticasone (FLONASE) 50 MCG/ACT nasal spray PLACE 2 SPRAYS INTO BOTH NOSTRILS AT BEDTIME AS NEEDED FOR ALLERGIES. 48 mL 1  . gabapentin (NEURONTIN) 100 MG capsule Take 1 capsule (100 mg total) by mouth 3 (three) times  daily. 90 capsule 11  . gabapentin (NEURONTIN) 400 MG capsule Take 1 capsule (400 mg total) by mouth 2 (two) times daily. 180 capsule 4  . losartan (COZAAR) 100 MG tablet TAKE ONE TABLET BY MOUTH ONE TIME DAILY 90 tablet 2  . metoprolol tartrate (LOPRESSOR) 50 MG tablet TAKE ONE TABLET BY MOUTH TWICE DAILY 180 tablet 2  . nitroGLYCERIN (NITROSTAT) 0.4 MG SL tablet Place 1 tablet (0.4 mg total) under the tongue every 5 (five) minutes as needed for chest pain. 30 tablet 6  . omeprazole (PRILOSEC) 20 MG capsule Take 1 capsule (20 mg total) by mouth 2 (two) times daily before a meal. 60 capsule 5  . polyethylene glycol powder (GLYCOLAX/MIRALAX) powder Take 17 g by mouth daily.  (Patient taking differently: Take 17 g by mouth every other day. ) 500 g 3  . rosuvastatin (CRESTOR) 40 MG tablet Take 1 tablet (40 mg total) by mouth daily. 90 tablet 3  . triamterene-hydrochlorothiazide (MAXZIDE-25) 37.5-25 MG tablet Take 0.5 tablets by mouth daily. 45 tablet 3  . famotidine (PEPCID) 20 MG tablet TAKE 1 TABLET BY MOUTH TWICE DAILY AS NEEDED FOR HEARTBURN OR INDIGESTION (Patient not taking: Reported on 02/05/2020) 180 tablet 0  . Multiple Vitamin (MULTIVITAMIN) tablet Take 1 tablet by mouth daily. (Patient not taking: Reported on 02/05/2020)    . Vitamin D, Ergocalciferol, (DRISDOL) 1.25 MG (50000 UNIT) CAPS capsule Take 1 capsule by mouth every week x 8 weeks, and then every 2 weeks. (Patient not taking: Reported on 02/05/2020) 12 capsule 3   No facility-administered medications prior to visit.    PAST MEDICAL HISTORY: Past Medical History:  Diagnosis Date  . Allergy   . Anxiety   . Arthritis    RA (Dr. Ouida Sills) Bilateral hands  . C. difficile diarrhea    history of  . CAD (coronary artery disease), native coronary artery    a. Cardiac CT with + FFR for Ramus b. cath 05/2017 100% sub acute lesion to mid circumflex artery s/p DES.  . Cancer ([place])    Right kidney CA removed.   . CKD (chronic kidney disease) stage 3, GFR 30-59 ml/min 10/11/2016   s/p R nephrectomy  . Colon polyps 2008   HYPERPLASTIC  . Costochondritis    a. Nuc Stress Test 6/16: EF 70, no scar or ischemia, low risk // b. Echo 3/16: mild conc LVH, EF 65-70, no RWMA, Gr 1 DD, mild TR  . Diabetes mellitus without complication ([place])    type 2  no meds  . Dyspnea   . Gastritis   . GERD (gastroesophageal reflux disease)   . History of echocardiogram    Echo 3/18:  Moderate LVH, EF 37-90, grade 1 diastolic dysfunction, calcified aortic valve, mild MR, moderate LAE  . History of nuclear stress test    Myoview 3/18: Mod size and intensity fixed septal defect, may be artifact.Opposite mod size and intensity lat  defect, which is reversible and could represent ischemia or possibly artifact (SDS 4). LVEF 71% with normal wall motion. Intermediate risk study. >> images reviewed with Dr. Dorris Carnes - no sig ischemia; med rx   . History of pneumonia   . Hx of cardiovascular stress test    Lexiscan Myoview 6/16:  EF 70%, no scar or ischemia; Low Risk  . Hyperlipidemia   . Hypertension   . Neuromuscular disorder ([place])    Neruopathy in bilateral feet  . Neuropathy   . Orthostatic hypotension 05/28/2017  . Osteoarthritis   .  Pneumonia   . S/P angioplasty with stent 05/27/17 to LCX with DES  05/28/2017  . Thyroid disease     PAST SURGICAL HISTORY: Past Surgical History:  Procedure Laterality Date  . ABDOMINAL HYSTERECTOMY  1978  . ANTERIOR CERVICAL DECOMP/DISCECTOMY FUSION N/A 05/29/2018   Procedure: ANTERIOR CERVICAL DECOMPRESSION FUSION - CERVICAL FIVE-CERVICAL SIX - CERVICAL SIX-CERVICAL SEVEN;  Surgeon: Earnie Larsson, MD;  Location: [facility];  Service: Neurosurgery;  Laterality: N/A;  . BACK SURGERY     x 2  . CARPAL TUNNEL RELEASE Left   . COLONOSCOPY W/ BIOPSIES AND POLYPECTOMY     Hx: of  . CORONARY STENT INTERVENTION N/A 05/27/2017   Procedure: CORONARY STENT INTERVENTION;  Surgeon: Burnell Blanks, MD;  Location: [facility];  Service: Cardiovascular;  Laterality: N/A;  . ESOPHAGOGASTRODUODENOSCOPY    . HNP    . LEFT HEART CATH AND CORONARY ANGIOGRAPHY N/A 05/27/2017   Procedure: LEFT HEART CATH AND CORONARY ANGIOGRAPHY;  Surgeon: Burnell Blanks, MD;  Location: [facility];  Service: Cardiovascular;  Laterality: N/A;  . LUMBAR LAMINECTOMY/DECOMPRESSION MICRODISCECTOMY Left 02/12/2013   Procedure: LUMBAR TWO THREE, LUMBAR THREE FOUR, LUMBAR FOUR FIVE  LAMINECTOMY/DECOMPRESSION MICRODISCECTOMY 3 LEVELS;  Surgeon: Charlie Pitter, MD;  Location: [facility];  Service: Neurosurgery;  Laterality: Left;  . NEPHRECTOMY Right 2010   10.rcc cancer  . TOTAL KNEE ARTHROPLASTY  Right    Redo    FAMILY HISTORY: Family History  Problem Relation Age of Onset  . Rheum arthritis Mother   . Stroke Mother   . Prostate cancer Father   . Heart disease Father   . Diabetes Brother   . Dementia Brother   . Parkinsonism Brother   . Kidney disease Son   . Diabetes Brother   . Cancer Brother   . Dementia Brother   . Colon cancer Neg Hx   . Esophageal cancer Neg Hx   . Pancreatic cancer Neg Hx   . Liver disease Neg Hx   . Rectal cancer Neg Hx   . Stomach cancer Neg Hx     SOCIAL HISTORY: Social History   Socioeconomic History  . Marital status: Divorced    Spouse name: Not on file  . Number of children: 3  . Years of education: 88  . Highest education level: Not on file  Occupational History  . Occupation: Retired    Fish farm manager: RETIRED  Tobacco Use  . Smoking status: Current Every Day Smoker    Packs/day: 0.20    Years: 15.00    Pack years: 3.00    Types: Cigarettes  . Smokeless tobacco: Never Used  . Tobacco comment: 03/16/17  1 pk/week  Vaping Use  . Vaping Use: Never used  Substance and Sexual Activity  . Alcohol use: No    Alcohol/week: 0.0 standard drinks  . Drug use: No  . Sexual activity: Not on file  Other Topics Concern  . Not on file  Social History Narrative   Single, dgtr lives with her   Never Smoked    Alcohol use- no   Drug use-no   Regular Exercise-yes   Former Smoker- 12/2008   Social Determinants of Health   Financial Resource Strain:   . Difficulty of Paying Living Expenses:   Food Insecurity:   . Worried About Charity fundraiser in the Last Year:   . Arboriculturist in the Last Year:   Transportation Needs:   . Lack of Transportation (  Medical):   [name] Kitchen Lack of Transportation (Non-Medical):   Physical Activity:   . Days of Exercise per Week:   . Minutes of Exercise per Session:   Stress:   . Feeling of Stress :   Social Connections:   . Frequency of Communication with Friends and Family:   . Frequency of Social  Gatherings with Friends and Family:   . Attends Religious Services:   . Active Member of Clubs or Organizations:   . Attends Archivist Meetings:   [name] Kitchen Marital Status:   Intimate Partner Violence:   . Fear of Current or Ex-Partner:   . Emotionally Abused:   [name] Kitchen Physically Abused:   . Sexually Abused:       PHYSICAL EXAM  Vitals:   03/03/20 1426  BP: 123/74  Pulse: 72  Weight: 236 lb (107 kg)  Height: 5\' 6"  (1.676 m)   Body mass index is 38.09 kg/m.  Generalized: Well developed, in no acute distress   Neurological examination  Mentation: Alert oriented to time, place, history taking. Follows all commands speech and language fluent Cranial nerve II-XII: Pupils were equal round reactive to light. Extraocular movements were full, visual field were full on confrontational test.. Head turning and shoulder shrug  were normal and symmetric. Motor: The motor testing reveals 5 over 5 strength of all 4 extremities. Good symmetric motor tone is noted throughout.  Sensory: Sensory testing is intact to soft touch on all 4 extremities. No evidence of extinction is noted.  Coordination: Cerebellar testing reveals good finger-nose-finger and heel-to-shin bilaterally.  Gait and station: Gait is normal. Tandem gait is normal. Romberg is negative. No drift is seen.  Reflexes: Deep tendon reflexes are symmetric and normal bilaterally.   DIAGNOSTIC DATA (LABS, IMAGING, TESTING) - I reviewed patient records, labs, notes, testing and imaging myself where available.  Lab Results  Component Value Date   WBC 7.5 02/23/2020   HGB 14.9 02/23/2020   HCT 46.4 (H) 02/23/2020   MCV 93.9 02/23/2020   PLT 242 02/23/2020      Component Value Date/Time   NA 140 02/23/2020 1441   NA 139 07/31/2019 1418   K 4.3 02/23/2020 1441   CL 105 02/23/2020 1441   CO2 26 02/23/2020 1441   GLUCOSE 81 02/23/2020 1441   BUN 20 02/23/2020 1441   BUN 20 07/31/2019 1418   CREATININE 1.52 (H) 02/23/2020 1441     CREATININE 1.18 (H) 10/01/2016 1622   CALCIUM 9.5 02/23/2020 1441   PROT 7.8 02/23/2020 1441   PROT 7.3 02/27/2019 1023   ALBUMIN 3.5 02/23/2020 1441   ALBUMIN 4.4 02/27/2019 1023   AST 22 02/23/2020 1441   ALT 14 02/23/2020 1441   ALKPHOS 67 02/23/2020 1441   BILITOT 0.7 02/23/2020 1441   BILITOT 0.4 02/27/2019 1023   GFRNONAA 33 (L) 02/23/2020 1441   GFRAA 38 (L) 02/23/2020 1441   Lab Results  Component Value Date   CHOL 142 02/27/2019   HDL 70 02/27/2019   LDLCALC 56 02/27/2019   LDLDIRECT 171.5 12/06/2011   TRIG 78 02/27/2019   CHOLHDL 2.0 02/27/2019   Lab Results  Component Value Date   HGBA1C 6.2 09/26/2019   Lab Results  Component Value Date   YOVZCHYI50 277 12/01/2016   Lab Results  Component Value Date   TSH 1.87 09/26/2019      ASSESSMENT AND PLAN 74 y.o. year old female  has a past medical history of Allergy, Anxiety, Arthritis, C. difficile diarrhea, CAD (coronary  artery disease), native coronary artery, Cancer ([place]), CKD (chronic kidney disease) stage 3, GFR 30-59 ml/min (10/11/2016), Colon polyps (2008), Costochondritis, Diabetes mellitus without complication ([place]), Dyspnea, Gastritis, GERD (gastroesophageal reflux disease), History of echocardiogram, History of nuclear stress test, History of pneumonia, cardiovascular stress test, Hyperlipidemia, Hypertension, Neuromuscular disorder ([place]), Neuropathy, Orthostatic hypotension (05/28/2017), Osteoarthritis, Pneumonia, S/P angioplasty with stent 05/27/17 to LCX with DES  (05/28/2017), and Thyroid disease. here with :  Neuropathy   Continue gabapentin  Advised if symptoms worsen or she develops new symptoms she should let us know  Follow-up in 6 months or sooner if needed   I spent 20 minutes of face-to-face and non-face-to-face time with patient.  This included previsit chart review, lab review, study review, order entry, electronic health record documentation, patient education.  Ward Givens,  MSN, NP-C 03/03/2020, 2:32 PM [facility] [address], [place], [place] [address] [phone]

## 2020-03-03 NOTE — Patient Instructions (Signed)
Your Plan: ? ?Continue Gabapentin  ?If your symptoms worsen or you develop new symptoms please let us know.  ? ? ?Thank you for coming to see us at [facility]. I hope we have been able to provide you high quality care today. ? ?You may receive a patient satisfaction survey over the next few weeks. We would appreciate your feedback and comments so that we may continue to improve ourselves and the health of our patients. ? ?

## 2020-03-04 ENCOUNTER — Other Ambulatory Visit: Payer: Self-pay | Admitting: Rheumatology

## 2020-03-04 ENCOUNTER — Ambulatory Visit: Payer: Self-pay

## 2020-03-04 ENCOUNTER — Other Ambulatory Visit: Payer: Self-pay

## 2020-03-04 DIAGNOSIS — Z20822 Contact with and (suspected) exposure to covid-19: Secondary | ICD-10-CM

## 2020-03-05 ENCOUNTER — Telehealth (INDEPENDENT_AMBULATORY_CARE_PROVIDER_SITE_OTHER): Payer: Medicare HMO | Admitting: Family Medicine

## 2020-03-05 ENCOUNTER — Encounter: Payer: Self-pay | Admitting: Family Medicine

## 2020-03-05 ENCOUNTER — Other Ambulatory Visit: Payer: Self-pay

## 2020-03-05 VITALS — Temp 98.6°F

## 2020-03-05 DIAGNOSIS — J069 Acute upper respiratory infection, unspecified: Secondary | ICD-10-CM

## 2020-03-05 DIAGNOSIS — Z7189 Other specified counseling: Secondary | ICD-10-CM

## 2020-03-05 LAB — NOVEL CORONAVIRUS, NAA: SARS-CoV-2, NAA: NOT DETECTED

## 2020-03-05 LAB — SARS-COV-2, NAA 2 DAY TAT

## 2020-03-05 NOTE — Progress Notes (Signed)
Virtual Visit via Telephone Note  I connected with [name] on 03/05/20 at  4:30 PM EDT by telephone and verified that I am speaking with the correct person using two identifiers.   I discussed the limitations, risks, security and privacy concerns of performing an evaluation and management service by telephone and the availability of in person appointments. I also discussed with the patient that there may be a patient responsible charge related to this service. The patient expressed understanding and agreed to proceed.  Location patient: home Location provider: work or home office Participants present for the call: patient, provider Patient did not have a visit in the prior 7 days to address this/these issue(s).   History of Present Illness: Pt is a 74 yo  started feeling bad Monday evening.  Pt notes head congestion, HA, "swimmy headed", lightheaded, no energy, loose stools, decreased appetite, myalgias, fever (tmax 100.5 F), cough.  Pt had one episode of n/v yesterday. Denies loss of taste, ear pain or pressure, facial pain or pressure, sore throat, rhinorrhea.  Taking Tylenol and decongestant med.  Pt had a COVID test yesterday at [facility] done.  Denies sick contacts, but went to a family reunion on Saturday.  Pt does not want her son who lives in the home of her to get sick as he is on dialysis.  Pt's daughter is present in the home.  Pt had 2 doses of Pfizer vaccine.   Observations/Objective: Patient sounds cheerful and well on the phone. I do not appreciate any SOB. Speech and thought processing are grossly intact. Patient reported vitals:  Assessment and Plan: Viral URI with cough -Discussed symptoms likely viral in nature acute nasopharyngitis, influenza, or COVID-19 virus infection. -COVID test pending.  Done yesterday, visible in chart review. -Discussed supportive care including Tylenol, Flonase, Mucinex, rest, hydration -Given precautions  Educated about COVID-19 virus  infection -Discussed signs and symptoms of COVID-19 virus infection -Covid testing pending -Given precautions   Follow Up Instructions: F/u prn for worsening   I did not refer this patient for an OV in the next 24 hours for this/these issue(s).  I discussed the assessment and treatment plan with the patient. The patient was provided an opportunity to ask questions and all were answered. The patient agreed with the plan and demonstrated an understanding of the instructions.   The patient was advised to call back or seek an in-person evaluation if the symptoms worsen or if the condition fails to improve as anticipated.  I provided 13 minutes of non-face-to-face time during this encounter.   Billie Ruddy, MD

## 2020-03-25 DIAGNOSIS — H524 Presbyopia: Secondary | ICD-10-CM | POA: Diagnosis not present

## 2020-03-25 DIAGNOSIS — E119 Type 2 diabetes mellitus without complications: Secondary | ICD-10-CM | POA: Diagnosis not present

## 2020-03-25 DIAGNOSIS — Z79899 Other long term (current) drug therapy: Secondary | ICD-10-CM | POA: Diagnosis not present

## 2020-03-26 ENCOUNTER — Ambulatory Visit (INDEPENDENT_AMBULATORY_CARE_PROVIDER_SITE_OTHER): Payer: Medicare HMO | Admitting: Family Medicine

## 2020-03-26 ENCOUNTER — Other Ambulatory Visit: Payer: Self-pay

## 2020-03-26 ENCOUNTER — Encounter: Payer: Self-pay | Admitting: Family Medicine

## 2020-03-26 VITALS — BP 124/74 | HR 71 | Temp 97.8°F | Resp 16 | Ht 66.0 in | Wt 233.4 lb

## 2020-03-26 DIAGNOSIS — Z0000 Encounter for general adult medical examination without abnormal findings: Secondary | ICD-10-CM

## 2020-03-26 DIAGNOSIS — E1122 Type 2 diabetes mellitus with diabetic chronic kidney disease: Secondary | ICD-10-CM

## 2020-03-26 DIAGNOSIS — E1169 Type 2 diabetes mellitus with other specified complication: Secondary | ICD-10-CM | POA: Diagnosis not present

## 2020-03-26 DIAGNOSIS — E785 Hyperlipidemia, unspecified: Secondary | ICD-10-CM

## 2020-03-26 DIAGNOSIS — E559 Vitamin D deficiency, unspecified: Secondary | ICD-10-CM

## 2020-03-26 DIAGNOSIS — J3089 Other allergic rhinitis: Secondary | ICD-10-CM

## 2020-03-26 DIAGNOSIS — N183 Chronic kidney disease, stage 3 unspecified: Secondary | ICD-10-CM

## 2020-03-26 DIAGNOSIS — I119 Hypertensive heart disease without heart failure: Secondary | ICD-10-CM | POA: Diagnosis not present

## 2020-03-26 MED ORDER — FLUTICASONE PROPIONATE 50 MCG/ACT NA SUSP: 2.0000 | Freq: Every evening | NASAL | 3 refills | Status: DC | PRN

## 2020-03-26 NOTE — Patient Instructions (Signed)
A few things to remember from today's visit:  Routine general medical examination at a health care facility  Hyperlipidemia associated with type 2 diabetes mellitus ([place]) - Plan: Lipid panel  Hypertension with heart disease  Type 2 diabetes mellitus with stage 3 chronic kidney disease, without long-term current use of insulin, unspecified whether stage 3a or 3b CKD ([place]) - Plan: Microalbumin / creatinine urine ratio, Hemoglobin A1c  Non-seasonal allergic rhinitis, unspecified trigger - Plan: fluticasone (FLONASE) 50 MCG/ACT nasal spray  Vitamin D deficiency, unspecified - Plan: VITAMIN D 25 Hydroxy (Vit-D Deficiency, Fractures)  If you need refills please call your pharmacy. Do not use My Chart to request refills or for acute issues that need immediate attention.   Please be sure medication list is accurate. If a new problem present, please set up appointment sooner than planned today.  A few tips:  -As we age balance is not as good as it was, so there is a higher risks for falls. Please remove small rugs and furniture that is "in your way" and could increase the risk of falls. Stretching exercises may help with fall prevention: Yoga and Tai Chi are some examples. Low impact exercise is better, so you are not very achy the next day.  -Sun screen and avoidance of direct sun light recommended. Caution with dehydration, if working outdoors be sure to drink enough fluids.  - Some medications are not safe as we age, increases the risk of side effects and can potentially interact with other medication you are also taken;  including some of over the counter medications. Be sure to let me know when you start a new medication even if it is a dietary/vitamin supplement.   -Healthy diet low in red meet/animal fat and sugar + regular physical activity is recommended.

## 2020-03-26 NOTE — Progress Notes (Signed)
HPI: Ms.Xia L Novakovich is a 74 y.o. female, who is here today for her routine physical.  Last CPE: 06/2016. No new problems since her last visit. She was last seen on 09/26/19, when she was c/o left shoulder pain, completed PT and pain greatly improved.  Regular exercise 3 or more time per week: She has not been exercise since COVID 19 pandemia. Following a healthy diet: Not consistently, she cooks. She eats vegetables, she does not eat red meat. She lives with daughter and son. Her son has had some health problems, frequent hospitalizations. He is doing better and has not been in the hospital since last visit,he is on dialysis.  She was very active at church before [place] 19.  Chronic medical problems: HTN,generalized OA,allergic rhinitis,anxiety,HLD,DM II,RA,CKD III,vit D def,and CAD among some. Axonal neuropathy: Follows with neurologist. GERD: Follows with GI. Urine incontinence: She saw urologist. Pending renal US. Medication for urinary urgency was prescribed, she received  samples, does not remember name but it helps.  Immunization History  Administered Date(s) Administered  . Fluad Quad(high Dose 65+) 05/05/2019  . Influenza Split 05/23/2012, 06/07/2013  . Influenza Whole 08/02/2005, 05/08/2007, 05/09/2008, 08/19/2009, 06/03/2010  . Influenza, High Dose Seasonal PF 06/20/2014, 05/13/2015, 06/08/2016, 05/23/2017, 05/18/2018  . Pneumococcal Conjugate-13 02/27/2007  . Pneumococcal Polysaccharide-23 02/27/2007, 12/16/2011, 10/16/2014, 05/26/2017  . Td 08/02/2001  . Tdap 12/16/2011  . Zoster 12/27/2012  COVID vaccine completed, Moderna.  Mammogram: 2021 at [facility], we do not have copy of report. Colonoscopy: 02/05/20. DEXA: 04/2019 normal.  Hep C screening: 08/16/18 NR DM II: She is on non pharmacologic treatment.  Lab Results  Component Value Date   HGBA1C 6.2 09/26/2019   Fatigue, which she attributes to age. Sleeps about 6 hours and takes a nap x 45 min, she feels  better. Vit D deficiency: Completed Ergocalciferol 50,000 U treatment.  Generalized OA and upper/lower back pain: She is not sure if taking Duloxetine. She takes Gabapentin for neuropathy,dose has been increased.  HTN: She is on Losartan 100 mg daily,[name] 37.5-25 mg 1/2 tab daily,and Metoprolol tartrate 50 mg bid. CAD s/p stent placement. She is on Plavix 75 mg daily and Aspirin 81 mg daily.  HLD: She is on Crestor 40 mg daily.  She needs a refills for Flonase nasal spray, which has helped in the past with nasal congestion,rhinorrhea. Negative for fever,chills,or changes in appetite.  Review of Systems  Constitutional: Negative for activity change and diaphoresis.  HENT: Negative for mouth sores, nosebleeds and sore throat.   Eyes: Negative for redness and visual disturbance.  Respiratory: Negative for cough, shortness of breath and wheezing.   Cardiovascular: Negative for chest pain, palpitations and leg swelling.  Gastrointestinal: Negative for abdominal pain, nausea and vomiting.       No changes in bowel habits.  Endocrine: Negative for cold intolerance, heat intolerance, polydipsia, polyphagia and polyuria.  Genitourinary: Negative for decreased urine volume, dysuria and hematuria.  Musculoskeletal: Positive for arthralgias and back pain. Negative for gait problem.  Skin: Negative for color change and rash.  Allergic/Immunologic: Positive for environmental allergies.  Neurological: Positive for numbness. Negative for syncope, weakness and headaches.  Psychiatric/Behavioral: Negative for confusion. The patient is nervous/anxious.   All other systems reviewed and are negative.  Current Outpatient Medications on File Prior to Visit  Medication Sig Dispense Refill  . acetaminophen (TYLENOL) 500 MG tablet Take 1,000 mg by mouth as needed for moderate pain or headache.     . albuterol (PROAIR HFA) 108 (  90 Base) MCG/ACT inhaler Inhale 1-2 puffs into the lungs every 6 (six)  hours as needed for wheezing or shortness of breath. 8 g 0  . aspirin 81 MG chewable tablet Chew 81 mg by mouth daily.    . clopidogrel (PLAVIX) 75 MG tablet take one tablet by mouth one time daily 90 tablet 1  . colchicine 0.6 MG tablet Take 0.5 tablets (0.3 mg total) by mouth daily. 45 tablet 0  . diclofenac sodium (VOLTAREN) 1 % GEL Apply 2 g topically as needed (for arm pain).     . DULoxetine (CYMBALTA) 30 MG capsule Take 1 capsule (30 mg total) by mouth daily. 90 capsule 1  . gabapentin (NEURONTIN) 100 MG capsule Take 1 capsule (100 mg total) by mouth 3 (three) times daily. 90 capsule 11  . gabapentin (NEURONTIN) 400 MG capsule Take 1 capsule (400 mg total) by mouth 2 (two) times daily. 180 capsule 4  . losartan (COZAAR) 100 MG tablet TAKE ONE TABLET BY MOUTH ONE TIME DAILY 90 tablet 2  . metoprolol tartrate (LOPRESSOR) 50 MG tablet TAKE ONE TABLET BY MOUTH TWICE DAILY 180 tablet 2  . nitroGLYCERIN (NITROSTAT) 0.4 MG SL tablet Place 1 tablet (0.4 mg total) under the tongue every 5 (five) minutes as needed for chest pain. 30 tablet 6  . omeprazole (PRILOSEC) 20 MG capsule Take 1 capsule (20 mg total) by mouth 2 (two) times daily before a meal. 60 capsule 5  . polyethylene glycol powder (GLYCOLAX/MIRALAX) powder Take 17 g by mouth daily. (Patient taking differently: Take 17 g by mouth every other day. ) 500 g 3  . rosuvastatin (CRESTOR) 40 MG tablet Take 1 tablet (40 mg total) by mouth daily. 90 tablet 3  . triamterene-hydrochlorothiazide (MAXZIDE-25) 37.5-25 MG tablet Take 0.5 tablets by mouth daily. 45 tablet 3   No current facility-administered medications on file prior to visit.   Past Medical History:  Diagnosis Date  . Allergy   . Anxiety   . Arthritis    RA (Dr. Ouida Sills) Bilateral hands  . C. difficile diarrhea    history of  . CAD (coronary artery disease), native coronary artery    a. Cardiac CT with + FFR for Ramus b. cath 05/2017 100% sub acute lesion to mid circumflex  artery s/p DES.  . Cancer ([place])    Right kidney CA removed.   . CKD (chronic kidney disease) stage 3, GFR 30-59 ml/min 10/11/2016   s/p R nephrectomy  . Colon polyps 2008   HYPERPLASTIC  . Costochondritis    a. Nuc Stress Test 6/16: EF 70, no scar or ischemia, low risk // b. Echo 3/16: mild conc LVH, EF 65-70, no RWMA, Gr 1 DD, mild TR  . Diabetes mellitus without complication ([place])    type 2  no meds  . Dyspnea   . Gastritis   . GERD (gastroesophageal reflux disease)   . History of echocardiogram    Echo 3/18:  Moderate LVH, EF 03-55, grade 1 diastolic dysfunction, calcified aortic valve, mild MR, moderate LAE  . History of nuclear stress test    Myoview 3/18: Mod size and intensity fixed septal defect, may be artifact.Opposite mod size and intensity lat defect, which is reversible and could represent ischemia or possibly artifact (SDS 4). LVEF 71% with normal wall motion. Intermediate risk study. >> images reviewed with Dr. Dorris Carnes - no sig ischemia; med rx   . History of pneumonia   . Hx of cardiovascular stress test  Lexiscan Myoview 6/16:  EF 70%, no scar or ischemia; Low Risk  . Hyperlipidemia   . Hypertension   . Neuromuscular disorder ([place])    Neruopathy in bilateral feet  . Neuropathy   . Orthostatic hypotension 05/28/2017  . Osteoarthritis   . Pneumonia   . S/P angioplasty with stent 05/27/17 to LCX with DES  05/28/2017  . Thyroid disease     Past Surgical History:  Procedure Laterality Date  . ABDOMINAL HYSTERECTOMY  1978  . ANTERIOR CERVICAL DECOMP/DISCECTOMY FUSION N/A 05/29/2018   Procedure: ANTERIOR CERVICAL DECOMPRESSION FUSION - CERVICAL FIVE-CERVICAL SIX - CERVICAL SIX-CERVICAL SEVEN;  Surgeon: Earnie Larsson, MD;  Location: [facility];  Service: Neurosurgery;  Laterality: N/A;  . BACK SURGERY     x 2  . CARPAL TUNNEL RELEASE Left   . COLONOSCOPY W/ BIOPSIES AND POLYPECTOMY     Hx: of  . CORONARY STENT INTERVENTION N/A 05/27/2017   Procedure: CORONARY STENT  INTERVENTION;  Surgeon: Burnell Blanks, MD;  Location: [facility];  Service: Cardiovascular;  Laterality: N/A;  . ESOPHAGOGASTRODUODENOSCOPY    . HNP    . LEFT HEART CATH AND CORONARY ANGIOGRAPHY N/A 05/27/2017   Procedure: LEFT HEART CATH AND CORONARY ANGIOGRAPHY;  Surgeon: Burnell Blanks, MD;  Location: [facility];  Service: Cardiovascular;  Laterality: N/A;  . LUMBAR LAMINECTOMY/DECOMPRESSION MICRODISCECTOMY Left 02/12/2013   Procedure: LUMBAR TWO THREE, LUMBAR THREE FOUR, LUMBAR FOUR FIVE  LAMINECTOMY/DECOMPRESSION MICRODISCECTOMY 3 LEVELS;  Surgeon: Charlie Pitter, MD;  Location: [facility];  Service: Neurosurgery;  Laterality: Left;  . NEPHRECTOMY Right 2010   10.rcc cancer  . TOTAL KNEE ARTHROPLASTY Right    Redo    Allergies  Allergen Reactions  . Oxycodone-Acetaminophen Hives and Itching    hallucinations  . Percocet [Oxycodone-Acetaminophen] Hives, Itching and Other (See Comments)    hallucinations  . Pravastatin Sodium Other (See Comments)    Muscle aches Muscle aches  . Hydrocodone Other (See Comments)    "crazy dreams"  . Aspirin Other (See Comments)    GI upset Other reaction(s): Other (See Comments) REACTION: GI upset    Family History  Problem Relation Age of Onset  . Rheum arthritis Mother   . Stroke Mother   . Prostate cancer Father   . Heart disease Father   . Diabetes Brother   . Dementia Brother   . Parkinsonism Brother   . Kidney disease Son   . Diabetes Brother   . Cancer Brother   . Dementia Brother   . Colon cancer Neg Hx   . Esophageal cancer Neg Hx   . Pancreatic cancer Neg Hx   . Liver disease Neg Hx   . Rectal cancer Neg Hx   . Stomach cancer Neg Hx     Social History   Socioeconomic History  . Marital status: Divorced    Spouse name: Not on file  . Number of children: 3  . Years of education: 52  . Highest education level: Not on file  Occupational History  . Occupation: Retired    Fish farm manager:  RETIRED  Tobacco Use  . Smoking status: Current Every Day Smoker    Packs/day: 0.20    Years: 15.00    Pack years: 3.00    Types: Cigarettes  . Smokeless tobacco: Never Used  . Tobacco comment: 03/16/17  1 pk/week  Vaping Use  . Vaping Use: Never used  Substance and Sexual Activity  . Alcohol use: No  Alcohol/week: 0.0 standard drinks  . Drug use: No  . Sexual activity: Not on file  Other Topics Concern  . Not on file  Social History Narrative   Single, dgtr lives with her   Never Smoked    Alcohol use- no   Drug use-no   Regular Exercise-yes   Former Smoker- 12/2008   Social Determinants of Health   Financial Resource Strain:   . Difficulty of Paying Living Expenses: Not on file  Food Insecurity:   . Worried About Charity fundraiser in the Last Year: Not on file  . Ran Out of Food in the Last Year: Not on file  Transportation Needs:   . Lack of Transportation (Medical): Not on file  . Lack of Transportation (Non-Medical): Not on file  Physical Activity:   . Days of Exercise per Week: Not on file  . Minutes of Exercise per Session: Not on file  Stress:   . Feeling of Stress : Not on file  Social Connections:   . Frequency of Communication with Friends and Family: Not on file  . Frequency of Social Gatherings with Friends and Family: Not on file  . Attends Religious Services: Not on file  . Active Member of Clubs or Organizations: Not on file  . Attends Archivist Meetings: Not on file  . Marital Status: Not on file    Vitals:   03/26/20 0958  BP: 124/74  Pulse: 71  Resp: 16  Temp: 97.8 F (36.6 C)  SpO2: 96%   Body mass index is 37.67 kg/m.  Wt Readings from Last 3 Encounters:  03/26/20 233 lb 6 oz (105.9 kg)  03/03/20 236 lb (107 kg)  02/05/20 234 lb (106.1 kg)    Physical Exam Vitals and nursing note reviewed.  Constitutional:      General: She is not in acute distress.    Appearance: She is well-developed.  HENT:     Head:  Normocephalic and atraumatic.     Right Ear: Hearing, tympanic membrane, ear canal and external ear normal.     Left Ear: Hearing, tympanic membrane, ear canal and external ear normal.     Mouth/Throat:     Mouth: Mucous membranes are moist.     Tongue: No lesions.     Pharynx: Oropharynx is clear.  Eyes:     Extraocular Movements: Extraocular movements intact.     Conjunctiva/sclera: Conjunctivae normal.     Pupils: Pupils are equal, round, and reactive to light.  Neck:     Thyroid: No thyroid mass or thyroid tenderness.     Trachea: No tracheal deviation.  Cardiovascular:     Rate and Rhythm: Normal rate and regular rhythm.     Pulses:          Dorsalis pedis pulses are 2+ on the right side and 2+ on the left side.     Heart sounds: No murmur heard.   Pulmonary:     Effort: Pulmonary effort is normal. No respiratory distress.     Breath sounds: Normal breath sounds.  Abdominal:     Palpations: Abdomen is soft. There is no hepatomegaly or mass.     Tenderness: There is no abdominal tenderness.  Musculoskeletal:     Comments: No signs of synovitis appreciated.  Lymphadenopathy:     Cervical: No cervical adenopathy.  Skin:    General: Skin is warm.     Findings: No erythema or rash.  Neurological:     General: No  focal deficit present.     Mental Status: She is alert and oriented to person, place, and time.     Cranial Nerves: No cranial nerve deficit.     Coordination: Coordination normal.     Gait: Gait normal.     Deep Tendon Reflexes:     Reflex Scores:      Bicep reflexes are 2+ on the right side and 2+ on the left side.      Patellar reflexes are 2+ on the right side and 2+ on the left side. Psychiatric:        Mood and Affect: Mood and affect normal.     Comments: Well groomed, good eye contact.    ASSESSMENT AND PLAN:  Ms. HARMONY SANDELL was here today annual physical examination.  Orders Placed This Encounter  Procedures  . Microalbumin / creatinine urine  ratio  . VITAMIN D 25 Hydroxy (Vit-D Deficiency, Fractures)  . Hemoglobin A1c  . Lipid panel    Lab Results  Component Value Date   HGBA1C 6.3 (H) 03/26/2020   Lab Results  Component Value Date   MICROALBUR 5.8 03/26/2020   MICROALBUR 16.9 (H) 09/26/2019    Lab Results  Component Value Date   CHOL 171 03/26/2020   HDL 51 03/26/2020   LDLCALC 98 03/26/2020   LDLDIRECT 171.5 12/06/2011   TRIG 122 03/26/2020   CHOLHDL 3.4 03/26/2020   Routine general medical examination at a health care facility We discussed the importance of regular physical activity and healthy diet for prevention of complications. Preventive guidelines reviewed. Vaccination up to date.  Ca++ and vit D supplementation recommended. Next CPE in a year.  Hyperlipidemia associated with type 2 diabetes mellitus (HCC) Continue Crestor 40 mg daily and low fat diet. LDL goal < 70. CAD: Smoking cessation is very important.  Hypertension with heart disease BP adequately controlled. No changes in current management. Low salt diet to continue.  Type 2 diabetes mellitus with stage 3 chronic kidney disease, without long-term current use of insulin, unspecified whether stage 3a or 3b CKD ([place]) HgA1C has been at goal. Continue non pharmacologic treatment. Consistency with regular physical activity as tolerated and healthy diet with avoidance of added sugar food intake encouraged. Annual eye exam and foot care recommended. F/U in 5-6 months  Non-seasonal allergic rhinitis, unspecified trigger Flonase nasal spray daily as needed. Nasal saline irrigations as needed will also help.  -     fluticasone (FLONASE) 50 MCG/ACT nasal spray; Place 2 sprays into both nostrils at bedtime as needed for allergies.  Vitamin D deficiency, unspecified Further recommendations in regard to Vit D dose will be given according to 25 OH vit D results.   Return in 5 months (on 08/26/2020) for AWV needed. .  Leray Garverick G. [name], MD   [facility]. [place] office.  A few things to remember from today's visit:  Routine general medical examination at a health care facility  Hyperlipidemia associated with type 2 diabetes mellitus ([place]) - Plan: Lipid panel  Hypertension with heart disease  Type 2 diabetes mellitus with stage 3 chronic kidney disease, without long-term current use of insulin, unspecified whether stage 3a or 3b CKD ([place]) - Plan: Microalbumin / creatinine urine ratio, Hemoglobin A1c  Non-seasonal allergic rhinitis, unspecified trigger - Plan: fluticasone (FLONASE) 50 MCG/ACT nasal spray  Vitamin D deficiency, unspecified - Plan: VITAMIN D 25 Hydroxy (Vit-D Deficiency, Fractures)  If you need refills please call your pharmacy. Do not use My Chart to  request refills or for acute issues that need immediate attention.   Please be sure medication list is accurate. If a new problem present, please set up appointment sooner than planned today.  A few tips:  -As we age balance is not as good as it was, so there is a higher risks for falls. Please remove small rugs and furniture that is "in your way" and could increase the risk of falls. Stretching exercises may help with fall prevention: Yoga and Tai Chi are some examples. Low impact exercise is better, so you are not very achy the next day.  -Sun screen and avoidance of direct sun light recommended. Caution with dehydration, if working outdoors be sure to drink enough fluids.  - Some medications are not safe as we age, increases the risk of side effects and can potentially interact with other medication you are also taken;  including some of over the counter medications. Be sure to let me know when you start a new medication even if it is a dietary/vitamin supplement.   -Healthy diet low in red meet/animal fat and sugar + regular physical activity is recommended.

## 2020-03-27 LAB — HEMOGLOBIN A1C
Hgb A1c MFr Bld: 6.3 % of total Hgb — ABNORMAL HIGH (ref <5.7)
Mean Plasma Glucose: 134 (calc)
eAG (mmol/L): 7.4 (calc)

## 2020-03-27 LAB — LIPID PANEL
Cholesterol: 171 mg/dL (ref <200)
HDL: 51 mg/dL (ref >=50)
LDL Cholesterol (Calc): 98 mg/dL (calc)
Non-HDL Cholesterol (Calc): 120 mg/dL (calc) (ref <130)
Total CHOL/HDL Ratio: 3.4 (calc) (ref <5.0)
Triglycerides: 122 mg/dL (ref <150)

## 2020-03-27 LAB — MICROALBUMIN / CREATININE URINE RATIO
Creatinine, Urine: 125 mg/dL (ref 20–275)
Microalb Creat Ratio: 46 mcg/mg creat — ABNORMAL HIGH (ref <30)
Microalb, Ur: 5.8 mg/dL

## 2020-03-27 LAB — VITAMIN D 25 HYDROXY (VIT D DEFICIENCY, FRACTURES): Vit D, 25-Hydroxy: 28 ng/mL — ABNORMAL LOW (ref 30–100)

## 2020-03-31 ENCOUNTER — Other Ambulatory Visit: Payer: Self-pay | Admitting: Urology

## 2020-03-31 ENCOUNTER — Other Ambulatory Visit (HOSPITAL_COMMUNITY): Payer: Self-pay | Admitting: Urology

## 2020-03-31 DIAGNOSIS — R944 Abnormal results of kidney function studies: Secondary | ICD-10-CM

## 2020-04-08 NOTE — Progress Notes (Signed)
Cardiology Office Note   Date:  04/09/2020   ID:  [name], [name] 05-May-1946, MRN [id number]  PCP:  [name], [name], MD  Cardiologist:   Dorris Carnes, MD   Patient presents for f/u of CAD and HTN      History of Present Illness: [name] is a 74 y.o. female with a history of CAD  She had a normal myovue in 2016 but in 2018 had lateral defect that was reversible but that I did not feel represented signficant ischemia   The patient presented with CP  CT showed + FFR and she underwent cath    This showed:  LAD 20%; Ramus 50%;  RCA 20% LCx 100%  The pt underent PTCA/DES to LCx   .  Patient also has a history of hypertension, hyperlipidemia, diabetes, chronic kidney disease (status post right nephrectomy), rheumatoid arthritis, OSA.  I saw the pt in Winter 2020   She was seen by Sundra Aland earlier this year   The pt says her breathing is OK   She denies CP    Activity is limited by joints  Current Meds  Medication Sig  . acetaminophen (TYLENOL) 500 MG tablet Take 1,000 mg by mouth as needed for moderate pain or headache.   . albuterol (PROAIR HFA) 108 (90 Base) MCG/ACT inhaler Inhale 1-2 puffs into the lungs every 6 (six) hours as needed for wheezing or shortness of breath.  [name] Kitchen aspirin 81 MG chewable tablet Chew 81 mg by mouth daily.  . clopidogrel (PLAVIX) 75 MG tablet take one tablet by mouth one time daily  . colchicine 0.6 MG tablet Take 0.5 tablets (0.3 mg total) by mouth daily.  . DULoxetine (CYMBALTA) 30 MG capsule Take 1 capsule (30 mg total) by mouth daily.  . fluticasone (FLONASE) 50 MCG/ACT nasal spray Place 2 sprays into both nostrils at bedtime as needed for allergies.  [name] Kitchen gabapentin (NEURONTIN) 100 MG capsule Take 1 capsule (100 mg total) by mouth 3 (three) times daily.  [name] Kitchen gabapentin (NEURONTIN) 400 MG capsule Take 1 capsule (400 mg total) by mouth 2 (two) times daily.  [name] Kitchen losartan (COZAAR) 100 MG tablet TAKE ONE TABLET BY MOUTH ONE TIME DAILY  . metoprolol tartrate  (LOPRESSOR) 50 MG tablet TAKE ONE TABLET BY MOUTH TWICE DAILY  . nitroGLYCERIN (NITROSTAT) 0.4 MG SL tablet Place 1 tablet (0.4 mg total) under the tongue every 5 (five) minutes as needed for chest pain.  [name] Kitchen omeprazole (PRILOSEC) 20 MG capsule Take 1 capsule (20 mg total) by mouth 2 (two) times daily before a meal.  . polyethylene glycol powder (GLYCOLAX/MIRALAX) powder Take 17 g by mouth daily. (Patient taking differently: Take 17 g by mouth every other day. )  . rosuvastatin (CRESTOR) 40 MG tablet Take 1 tablet (40 mg total) by mouth daily.     Allergies:   Oxycodone-acetaminophen, Percocet [oxycodone-acetaminophen], Pravastatin sodium, Hydrocodone, and Aspirin   Past Medical History:  Diagnosis Date  . Allergy   . Anxiety   . Arthritis    RA (Dr. Ouida Sills) Bilateral hands  . C. difficile diarrhea    history of  . CAD (coronary artery disease), native coronary artery    a. Cardiac CT with + FFR for Ramus b. cath 05/2017 100% sub acute lesion to mid circumflex artery s/p DES.  . Cancer ([facility])    Right kidney CA removed.   . CKD (chronic kidney disease) stage 3, GFR 30-59 ml/min 10/11/2016   s/p R nephrectomy  .  Colon polyps 2008   HYPERPLASTIC  . Costochondritis    a. Nuc Stress Test 6/16: EF 70, no scar or ischemia, low risk // b. Echo 3/16: mild conc LVH, EF 65-70, no RWMA, Gr 1 DD, mild TR  . Diabetes mellitus without complication ([place])    type 2  no meds  . Dyspnea   . Gastritis   . GERD (gastroesophageal reflux disease)   . History of echocardiogram    Echo 3/18:  Moderate LVH, EF 08-67, grade 1 diastolic dysfunction, calcified aortic valve, mild MR, moderate LAE  . History of nuclear stress test    Myoview 3/18: Mod size and intensity fixed septal defect, may be artifact.Opposite mod size and intensity lat defect, which is reversible and could represent ischemia or possibly artifact (SDS 4). LVEF 71% with normal wall motion. Intermediate risk study. >> images reviewed with  Dr. Dorris Carnes - no sig ischemia; med rx   . History of pneumonia   . Hx of cardiovascular stress test    Lexiscan Myoview 6/16:  EF 70%, no scar or ischemia; Low Risk  . Hyperlipidemia   . Hypertension   . Neuromuscular disorder ([place])    Neruopathy in bilateral feet  . Neuropathy   . Orthostatic hypotension 05/28/2017  . Osteoarthritis   . Pneumonia   . S/P angioplasty with stent 05/27/17 to LCX with DES  05/28/2017  . Thyroid disease     Past Surgical History:  Procedure Laterality Date  . ABDOMINAL HYSTERECTOMY  1978  . ANTERIOR CERVICAL DECOMP/DISCECTOMY FUSION N/A 05/29/2018   Procedure: ANTERIOR CERVICAL DECOMPRESSION FUSION - CERVICAL FIVE-CERVICAL SIX - CERVICAL SIX-CERVICAL SEVEN;  Surgeon: Earnie Larsson, MD;  Location: [facility];  Service: Neurosurgery;  Laterality: N/A;  . BACK SURGERY     x 2  . CARPAL TUNNEL RELEASE Left   . COLONOSCOPY W/ BIOPSIES AND POLYPECTOMY     Hx: of  . CORONARY STENT INTERVENTION N/A 05/27/2017   Procedure: CORONARY STENT INTERVENTION;  Surgeon: Burnell Blanks, MD;  Location: [facility];  Service: Cardiovascular;  Laterality: N/A;  . ESOPHAGOGASTRODUODENOSCOPY    . HNP    . LEFT HEART CATH AND CORONARY ANGIOGRAPHY N/A 05/27/2017   Procedure: LEFT HEART CATH AND CORONARY ANGIOGRAPHY;  Surgeon: Burnell Blanks, MD;  Location: [facility];  Service: Cardiovascular;  Laterality: N/A;  . LUMBAR LAMINECTOMY/DECOMPRESSION MICRODISCECTOMY Left 02/12/2013   Procedure: LUMBAR TWO THREE, LUMBAR THREE FOUR, LUMBAR FOUR FIVE  LAMINECTOMY/DECOMPRESSION MICRODISCECTOMY 3 LEVELS;  Surgeon: Charlie Pitter, MD;  Location: [facility];  Service: Neurosurgery;  Laterality: Left;  . NEPHRECTOMY Right 2010   10.rcc cancer  . TOTAL KNEE ARTHROPLASTY Right    Redo     Social History:  The patient  reports that she has been smoking cigarettes. She has a 3.00 pack-year smoking history. She has never used smokeless tobacco. She reports that  she does not drink alcohol and does not use drugs.   Family History:  The patient's family history includes Cancer in her brother; Dementia in her brother and brother; Diabetes in her brother and brother; Heart disease in her father; Kidney disease in her son; Parkinsonism in her brother; Prostate cancer in her father; Rheum arthritis in her mother; Stroke in her mother.    ROS:  Please see the history of present illness. All other systems are reviewed and  Negative to the above problem except as noted.    PHYSICAL EXAM: VS:  BP [name] Kitchen)  148/84   Pulse 66   Ht 5\' 6"  (1.676 m)   Wt 233 lb 9.6 oz (106 kg)   SpO2 98%   BMI 37.70 kg/m   UYE:BXIDHWYS obese 73yo in  in no acute distress  HEENT: normal  Neck: no JVD Cardiac: RRR; no murmurs.  No LE  edema  Respiratory:  clear to auscultation bilaterally. GI: soft, nontender  No masses MS: no deformity Moving all extremities   Skin: warm and dry, no rash Neuro:  Strength and sensation are intact Psych: euthymic mood, full affect   EKG:  EKG is not  ordered today.   Lipid Panel    Component Value Date/Time   CHOL 171 03/26/2020 1041   CHOL 142 02/27/2019 1023   TRIG 122 03/26/2020 1041   HDL 51 03/26/2020 1041   HDL 70 02/27/2019 1023   CHOLHDL 3.4 03/26/2020 1041   VLDL 20.4 08/16/2018 0945   LDLCALC 98 03/26/2020 1041   LDLDIRECT 171.5 12/06/2011 0812      Wt Readings from Last 3 Encounters:  04/09/20 233 lb 9.6 oz (106 kg)  03/26/20 233 lb 6 oz (105.9 kg)  03/03/20 236 lb (107 kg)      ASSESSMENT AND PLAN:  1.  CAD.  Status post intervention back in 2018.  Remains asymptomatic    Will contact patient    Continue ASA   Stop Plavix  2.  Hypertension.  Pt's BP is running better at home   168H to 729M systolic   COntinue meds    3.  Hyperlipidemia.  Keep on Crestor. LDL was 98 last month   Would add Zetia 10 mg   F/U in 2 months     Current medicines are reviewed at length with the patient today.  The patient does not  have concerns regarding medicines.  Signed, Dorris Carnes, MD  04/09/2020 4:01 PM    [facility] [address], [place], [place]  [address] Phone: [phone]; Fax: [phone]

## 2020-04-09 ENCOUNTER — Ambulatory Visit: Payer: Medicare HMO | Admitting: Internal Medicine

## 2020-04-09 ENCOUNTER — Telehealth: Payer: Self-pay | Admitting: Internal Medicine

## 2020-04-09 ENCOUNTER — Other Ambulatory Visit: Payer: Self-pay

## 2020-04-09 ENCOUNTER — Encounter: Payer: Self-pay | Admitting: Internal Medicine

## 2020-04-09 VITALS — BP 148/84 | HR 66 | Ht 66.0 in | Wt 233.6 lb

## 2020-04-09 DIAGNOSIS — E785 Hyperlipidemia, unspecified: Secondary | ICD-10-CM

## 2020-04-09 MED ORDER — EZETIMIBE 10 MG PO TABS
10.0000 mg | ORAL_TABLET | Freq: Every day | ORAL | 3 refills | Status: DC
Start: 2020-04-09 — End: 2021-03-31

## 2020-04-09 NOTE — Patient Instructions (Signed)
Medication Instructions:  Your physician has recommended you make the following change in your medication:  1.) start ezetimibe (Zetia) 10 mg --one tablet once a day  *If you need a refill on your cardiac medications before your next appointment, please call your pharmacy*   Lab Work: Lipids in 8 weeks  If you have labs (blood work) drawn today and your tests are completely normal, you will receive your results only by: [name] Kitchen MyChart Message (if you have MyChart) OR . A paper copy in the mail If you have any lab test that is abnormal or we need to change your treatment, we will call you to review the results.   Testing/Procedures: none   Follow-Up: At [facility], you and your health needs are our priority.  As part of our continuing mission to provide you with exceptional heart care, we have created designated Provider Care Teams.  These Care Teams include your primary Cardiologist (physician) and Advanced Practice Providers (APPs -  Physician Assistants and Nurse Practitioners) who all work together to provide you with the care you need, when you need it.   Your next appointment:   8 month(s)  The format for your next appointment:   In Person  Provider:   You may see Dorris Carnes, MD or one of the following Advanced Practice Providers on your designated Care Team:    Richardson Dopp, PA-C  Robbie Lis, [place]    Other Instructions

## 2020-04-09 NOTE — Telephone Encounter (Signed)
On review of records would stop Plavix   Continue aspirin daily

## 2020-04-10 NOTE — Telephone Encounter (Signed)
Called patient and informed to stop Plavix.  She is aware to continue aspirin daily.

## 2020-04-10 NOTE — Addendum Note (Signed)
Addended by: Rodman Key on: 04/10/2020 09:11 AM   Modules accepted: Orders

## 2020-04-11 ENCOUNTER — Ambulatory Visit (HOSPITAL_COMMUNITY)
Admission: RE | Admit: 2020-04-11 | Discharge: 2020-04-11 | Disposition: A | Discharge status: Home / Self Care | Payer: Medicare HMO | Source: Ambulatory Visit | Attending: Urology | Admitting: Urology

## 2020-04-11 DIAGNOSIS — R944 Abnormal results of kidney function studies: Secondary | ICD-10-CM | POA: Insufficient documentation

## 2020-04-11 DIAGNOSIS — N1330 Unspecified hydronephrosis: Secondary | ICD-10-CM | POA: Diagnosis not present

## 2020-04-11 DIAGNOSIS — N189 Chronic kidney disease, unspecified: Secondary | ICD-10-CM | POA: Diagnosis not present

## 2020-04-29 ENCOUNTER — Encounter: Payer: Self-pay | Admitting: Podiatry

## 2020-04-29 ENCOUNTER — Ambulatory Visit (INDEPENDENT_AMBULATORY_CARE_PROVIDER_SITE_OTHER): Payer: Medicare HMO | Admitting: Podiatry

## 2020-04-29 ENCOUNTER — Other Ambulatory Visit: Payer: Self-pay

## 2020-04-29 ENCOUNTER — Ambulatory Visit (INDEPENDENT_AMBULATORY_CARE_PROVIDER_SITE_OTHER): Payer: Medicare HMO

## 2020-04-29 DIAGNOSIS — M7752 Other enthesopathy of left foot: Secondary | ICD-10-CM

## 2020-04-29 DIAGNOSIS — M7750 Other enthesopathy of unspecified foot: Secondary | ICD-10-CM

## 2020-04-29 DIAGNOSIS — M779 Enthesopathy, unspecified: Secondary | ICD-10-CM

## 2020-04-29 DIAGNOSIS — M19072 Primary osteoarthritis, left ankle and foot: Secondary | ICD-10-CM

## 2020-05-01 NOTE — Progress Notes (Signed)
Subjective:   Patient ID: [name], female   DOB: 74 y.o.   MRN: [id number]   HPI 74 year old female presents the office today for concerns of pain to the top and lateral aspect of the left foot and ankle.  She also has swelling to the area.  She is getting Tylenol as needed.  She denies any recent injury or trauma.  No recent treatment otherwise.  She is not sure if this is due to arthritis she does have a history of rheumatoid arthritis.  She also has neuropathy.  She has no other concerns today.  She states that she is "borderline" diabetic   Review of Systems  All other systems reviewed and are negative.  Past Medical History:  Diagnosis Date  . Allergy   . Anxiety   . Arthritis    RA (Dr. Ouida Sills) Bilateral hands  . C. difficile diarrhea    history of  . CAD (coronary artery disease), native coronary artery    a. Cardiac CT with + FFR for Ramus b. cath 05/2017 100% sub acute lesion to mid circumflex artery s/p DES.  . Cancer ([facility])    Right kidney CA removed.   . CKD (chronic kidney disease) stage 3, GFR 30-59 ml/min 10/11/2016   s/p R nephrectomy  . Colon polyps 2008   HYPERPLASTIC  . Costochondritis    a. Nuc Stress Test 6/16: EF 70, no scar or ischemia, low risk // b. Echo 3/16: mild conc LVH, EF 65-70, no RWMA, Gr 1 DD, mild TR  . Diabetes mellitus without complication ([place])    type 2  no meds  . Dyspnea   . Gastritis   . GERD (gastroesophageal reflux disease)   . History of echocardiogram    Echo 3/18:  Moderate LVH, EF 84-16, grade 1 diastolic dysfunction, calcified aortic valve, mild MR, moderate LAE  . History of nuclear stress test    Myoview 3/18: Mod size and intensity fixed septal defect, may be artifact.Opposite mod size and intensity lat defect, which is reversible and could represent ischemia or possibly artifact (SDS 4). LVEF 71% with normal wall motion. Intermediate risk study. >> images reviewed with Dr. Dorris Carnes - no sig ischemia; med rx   . History  of pneumonia   . Hx of cardiovascular stress test    Lexiscan Myoview 6/16:  EF 70%, no scar or ischemia; Low Risk  . Hyperlipidemia   . Hypertension   . Neuromuscular disorder ([place])    Neruopathy in bilateral feet  . Neuropathy   . Orthostatic hypotension 05/28/2017  . Osteoarthritis   . Pneumonia   . S/P angioplasty with stent 05/27/17 to LCX with DES  05/28/2017  . Thyroid disease     Past Surgical History:  Procedure Laterality Date  . ABDOMINAL HYSTERECTOMY  1978  . ANTERIOR CERVICAL DECOMP/DISCECTOMY FUSION N/A 05/29/2018   Procedure: ANTERIOR CERVICAL DECOMPRESSION FUSION - CERVICAL FIVE-CERVICAL SIX - CERVICAL SIX-CERVICAL SEVEN;  Surgeon: Earnie Larsson, MD;  Location: [facility];  Service: Neurosurgery;  Laterality: N/A;  . BACK SURGERY     x 2  . CARPAL TUNNEL RELEASE Left   . COLONOSCOPY W/ BIOPSIES AND POLYPECTOMY     Hx: of  . CORONARY STENT INTERVENTION N/A 05/27/2017   Procedure: CORONARY STENT INTERVENTION;  Surgeon: Burnell Blanks, MD;  Location: [facility];  Service: Cardiovascular;  Laterality: N/A;  . ESOPHAGOGASTRODUODENOSCOPY    . HNP    . LEFT HEART CATH AND CORONARY ANGIOGRAPHY  N/A 05/27/2017   Procedure: LEFT HEART CATH AND CORONARY ANGIOGRAPHY;  Surgeon: Burnell Blanks, MD;  Location: [facility];  Service: Cardiovascular;  Laterality: N/A;  . LUMBAR LAMINECTOMY/DECOMPRESSION MICRODISCECTOMY Left 02/12/2013   Procedure: LUMBAR TWO THREE, LUMBAR THREE FOUR, LUMBAR FOUR FIVE  LAMINECTOMY/DECOMPRESSION MICRODISCECTOMY 3 LEVELS;  Surgeon: Charlie Pitter, MD;  Location: [facility];  Service: Neurosurgery;  Laterality: Left;  . NEPHRECTOMY Right 2010   10.rcc cancer  . TOTAL KNEE ARTHROPLASTY Right    Redo     Current Outpatient Medications:  .  acetaminophen (TYLENOL) 500 MG tablet, Take 1,000 mg by mouth as needed for moderate pain or headache. , Disp: , Rfl:  .  albuterol (PROAIR HFA) 108 (90 Base) MCG/ACT inhaler, Inhale 1-2  puffs into the lungs every 6 (six) hours as needed for wheezing or shortness of breath., Disp: 8 g, Rfl: 0 .  aspirin 81 MG chewable tablet, Chew 81 mg by mouth daily., Disp: , Rfl:  .  colchicine 0.6 MG tablet, Take 0.5 tablets (0.3 mg total) by mouth daily., Disp: 45 tablet, Rfl: 0 .  diclofenac sodium (VOLTAREN) 1 % GEL, Apply 2 g topically as needed (for arm pain).  (Patient not taking: Reported on 04/09/2020), Disp: , Rfl:  .  DULoxetine (CYMBALTA) 30 MG capsule, Take 1 capsule (30 mg total) by mouth daily., Disp: 90 capsule, Rfl: 1 .  ezetimibe (ZETIA) 10 MG tablet, Take 1 tablet (10 mg total) by mouth daily., Disp: 90 tablet, Rfl: 3 .  fluticasone (FLONASE) 50 MCG/ACT nasal spray, Place 2 sprays into both nostrils at bedtime as needed for allergies., Disp: 48 mL, Rfl: 3 .  gabapentin (NEURONTIN) 100 MG capsule, Take 1 capsule (100 mg total) by mouth 3 (three) times daily., Disp: 90 capsule, Rfl: 11 .  gabapentin (NEURONTIN) 400 MG capsule, Take 1 capsule (400 mg total) by mouth 2 (two) times daily., Disp: 180 capsule, Rfl: 4 .  losartan (COZAAR) 100 MG tablet, TAKE ONE TABLET BY MOUTH ONE TIME DAILY, Disp: 90 tablet, Rfl: 2 .  metoprolol tartrate (LOPRESSOR) 50 MG tablet, TAKE ONE TABLET BY MOUTH TWICE DAILY, Disp: 180 tablet, Rfl: 2 .  MYRBETRIQ 25 MG TB24 tablet, Take 25 mg by mouth daily., Disp: , Rfl:  .  nitroGLYCERIN (NITROSTAT) 0.4 MG SL tablet, Place 1 tablet (0.4 mg total) under the tongue every 5 (five) minutes as needed for chest pain., Disp: 30 tablet, Rfl: 6 .  omeprazole (PRILOSEC) 20 MG capsule, Take 1 capsule (20 mg total) by mouth 2 (two) times daily before a meal., Disp: 60 capsule, Rfl: 5 .  polyethylene glycol powder (GLYCOLAX/MIRALAX) powder, Take 17 g by mouth daily. (Patient taking differently: Take 17 g by mouth every other day. ), Disp: 500 g, Rfl: 3 .  rosuvastatin (CRESTOR) 40 MG tablet, Take 1 tablet (40 mg total) by mouth daily., Disp: 90 tablet, Rfl: 3 .   triamterene-hydrochlorothiazide (MAXZIDE-25) 37.5-25 MG tablet, Take 0.5 tablets by mouth daily. (Patient not taking: Reported on 04/09/2020), Disp: 45 tablet, Rfl: 3  Allergies  Allergen Reactions  . Oxycodone-Acetaminophen Hives and Itching    hallucinations  . Percocet [Oxycodone-Acetaminophen] Hives, Itching and Other (See Comments)    hallucinations  . Pravastatin Sodium Other (See Comments)    Muscle aches Muscle aches  . Hydrocodone Other (See Comments)    "crazy dreams"  . Aspirin Other (See Comments)    GI upset Other reaction(s): Other (See Comments) REACTION: GI upset  Objective:  Physical Exam  General: AAO x3, NAD  Dermatological: Skin is warm, dry and supple bilateral.There are no open sores, no preulcerative lesions, no rash or signs of infection present.  Vascular: Dorsalis Pedis artery and Posterior Tibial artery pedal pulses are 2/4 bilateral with immedate capillary fill time. There is no pain with calf compression, swelling, warmth, erythema.   Neruologic: Patient diagnosed with neuropathy.  Mild decreased sensation with Semmes Weinstein monofilament  Musculoskeletal: Dorsal aspect of the midfoot there is a prominent bone spur present with tenderness palpation.  Also tenderness lateral aspect from the sinus tarsi.  Mild edema.  There is no erythema or warmth.  Flexor, extensor tendons are intact.  There is mild discomfort with subtalar range of motion.  Muscular strength 5/5 in all groups tested bilateral.  Gait: Unassisted, Nonantalgic.       Assessment:   74 year old female with arthritis, capsulitis/tendinitis left foot     Plan:  -Treatment options discussed including all alternatives, risks, and complications -Etiology of symptoms were discussed -X-rays were obtained and reviewed with the patient.  Arthritic changes present on the talonavicular joint as well as midfoot. -At this point she was to proceed with a steroid injection into the  dorsal midfoot at the surrounding joints tenderness is localized.  Skin was prepped with alcohol mixture of 1 cc Kenalog 10, 0.5 cc of Marcaine plain, 0.5 cc lidocaine plain was infiltrated in a regional block fashion no complications.  Postinjection care discussed.  She tolerated well.  She was taken to avoid any neurovascular compromise during the injection. -Voltaren gel as needed  Trula Slade DPM

## 2020-06-04 ENCOUNTER — Other Ambulatory Visit: Payer: Self-pay

## 2020-06-04 ENCOUNTER — Other Ambulatory Visit: Payer: Medicare HMO | Admitting: *Deleted

## 2020-06-04 DIAGNOSIS — E785 Hyperlipidemia, unspecified: Secondary | ICD-10-CM

## 2020-06-05 LAB — LIPID PANEL
Chol/HDL Ratio: 3.5 ratio (ref 0.0–4.4)
Cholesterol, Total: 181 mg/dL (ref 100–199)
HDL: 52 mg/dL (ref >39)
LDL Chol Calc (NIH): 106 mg/dL — ABNORMAL HIGH (ref 0–99)
Triglycerides: 129 mg/dL (ref 0–149)
VLDL Cholesterol Cal: 23 mg/dL (ref 5–40)

## 2020-06-06 ENCOUNTER — Other Ambulatory Visit: Payer: Self-pay | Admitting: *Deleted

## 2020-06-06 DIAGNOSIS — E785 Hyperlipidemia, unspecified: Secondary | ICD-10-CM

## 2020-06-06 NOTE — Progress Notes (Signed)
Ab ref

## 2020-06-09 ENCOUNTER — Ambulatory Visit (INDEPENDENT_AMBULATORY_CARE_PROVIDER_SITE_OTHER): Payer: Medicare HMO

## 2020-06-09 ENCOUNTER — Encounter: Payer: Self-pay | Admitting: Family Medicine

## 2020-06-09 ENCOUNTER — Other Ambulatory Visit: Payer: Self-pay

## 2020-06-09 ENCOUNTER — Ambulatory Visit (INDEPENDENT_AMBULATORY_CARE_PROVIDER_SITE_OTHER): Payer: Medicare HMO | Admitting: Family Medicine

## 2020-06-09 ENCOUNTER — Ambulatory Visit: Payer: Medicare HMO | Admitting: Family Medicine

## 2020-06-09 VITALS — BP 134/76 | HR 65 | Temp 98.5°F | Resp 16 | Ht 66.0 in | Wt 231.0 lb

## 2020-06-09 DIAGNOSIS — E785 Hyperlipidemia, unspecified: Secondary | ICD-10-CM

## 2020-06-09 DIAGNOSIS — M545 Low back pain, unspecified: Secondary | ICD-10-CM | POA: Diagnosis not present

## 2020-06-09 DIAGNOSIS — I119 Hypertensive heart disease without heart failure: Secondary | ICD-10-CM | POA: Diagnosis not present

## 2020-06-09 DIAGNOSIS — Z23 Encounter for immunization: Secondary | ICD-10-CM

## 2020-06-09 DIAGNOSIS — W19XXXA Unspecified fall, initial encounter: Secondary | ICD-10-CM

## 2020-06-09 DIAGNOSIS — E559 Vitamin D deficiency, unspecified: Secondary | ICD-10-CM

## 2020-06-09 DIAGNOSIS — Y92009 Unspecified place in unspecified non-institutional (private) residence as the place of occurrence of the external cause: Secondary | ICD-10-CM

## 2020-06-09 DIAGNOSIS — E1169 Type 2 diabetes mellitus with other specified complication: Secondary | ICD-10-CM | POA: Diagnosis not present

## 2020-06-09 MED ORDER — TRAMADOL HCL 50 MG PO TABS: 50.0000 mg | ORAL_TABLET | Freq: Every day | ORAL | 0 refills | Status: AC | PRN

## 2020-06-09 NOTE — Progress Notes (Signed)
Chief Complaint  Patient presents with  . Fall   HPI: [name] is a 74 y.o. female, who is here today complaining of left lower back pain,worse after a fall a few days ago.  On 06/02/2020 she fell "good", no pain right after falls. She started with lower back soreness 2nd day. She was looking for [place] dressing, squatted and when getting up she lost balance and fell backwards. She thinks she may have hit the back of her head but mild, she is not having any headache.  Local heat and massage have helped. Pain was improving but started getting worse again a couple days ago.  9/10 dull pain, intermittent.  Exacerbated by movement, mainly after sitting for long time. Pain is interfering with sleep, when she sleeps on her back.  She feels better sleeping on her side. In the morning stiffness and pain are worse. Stiffness alleviated some by movement.  Negative for fever, chills, change in appetite, edema/erythema/ecchymoses on affected area.  History of chronic going on upper back pain, s/p back surgery x2 (lumbar laminectomy/decompression).  She wonders if he needs to see her neurosurgeon.  Lumbar MRI on 07/26/2015: 1. New large superiorly extending left lateral disc protrusion with severe left lateral recess narrowing posterior to the L4 vertebral body, likely affecting the left L4 and L5 nerve roots. Severe left and moderate right foraminal stenosis is present at the same level. 2. Decompression of the posterior central canal at L2-3 and L3-4. 3. Mild subarticular and moderate foraminal stenosis bilaterally at L2-3 is similar to the prior study. 4. Moderate left and mild right foraminal stenosis at L3-4 is stable. 5. Left laminectomy without residual or recurrent stenosis at L5-S1.  LLB pain is sometimes radiated to LLE Little numbness tingling. She has taken Tylenol and also took Ibuprofen x 1.  Pain interferes with sleep.  Negative for saddle anesthesia and changes  in bowel/bladder funciton.  History of chronic back pain generalized OA. Currently she is on duloxetine 30 mg daily.  -She has some questions about which vits she needs to be taking. She is on Ergocalciferol 50,000 U weekly ,she has one more cap. Last 25 [place] vitamin D was done on 03/26/2020, low at 28.  She has appt with her cardiologist tomorrow, she is concerned about elevated cholesterol. Currently she is on Crestor 40 mg daily and Zetia 10 mg daily.  Hypertension: Currently she is on losartan 100 mg daily, metoprolol tartrate 50 mg twice daily, [name] 37.5-25 mg 0.5 tab daily. She has not noted gross hematuria, foamy urine, or decreased urine output. Negative for CP, dyspnea, palpitation, or worsening edema.  Lab Results  Component Value Date   CREATININE 1.52 (H) 02/23/2020   BUN 20 02/23/2020   NA 140 02/23/2020   K 4.3 02/23/2020   CL 105 02/23/2020   CO2 26 02/23/2020   Review of Systems  Constitutional: Positive for activity change and fatigue.  HENT: Negative for mouth sores, nosebleeds and sore throat.   Eyes: Negative for redness and visual disturbance.  Respiratory: Negative for cough and wheezing.   Gastrointestinal: Negative for abdominal pain, nausea and vomiting.       Negative for changes in bowel habits.  Genitourinary: Negative for dysuria.  Musculoskeletal: Negative for gait problem.  Skin: Negative for pallor and rash.  Neurological: Negative for syncope, facial asymmetry and weakness.  Psychiatric/Behavioral: Positive for sleep disturbance. Negative for confusion.  Rest see pertinent positives and negatives per HPI.  Current  Outpatient Medications on File Prior to Visit  Medication Sig Dispense Refill  . acetaminophen (TYLENOL) 500 MG tablet Take 1,000 mg by mouth as needed for moderate pain or headache.     . albuterol (PROAIR HFA) 108 (90 Base) MCG/ACT inhaler Inhale 1-2 puffs into the lungs every 6 (six) hours as needed for wheezing or  shortness of breath. 8 g 0  . aspirin 81 MG chewable tablet Chew 81 mg by mouth daily.    . colchicine 0.6 MG tablet Take 0.5 tablets (0.3 mg total) by mouth daily. 45 tablet 0  . diclofenac sodium (VOLTAREN) 1 % GEL Apply 2 g topically as needed (for arm pain).  (Patient not taking: Reported on 04/09/2020)    . DULoxetine (CYMBALTA) 30 MG capsule Take 1 capsule (30 mg total) by mouth daily. 90 capsule 1  . ezetimibe (ZETIA) 10 MG tablet Take 1 tablet (10 mg total) by mouth daily. 90 tablet 3  . fluticasone (FLONASE) 50 MCG/ACT nasal spray Place 2 sprays into both nostrils at bedtime as needed for allergies. 48 mL 3  . gabapentin (NEURONTIN) 100 MG capsule Take 1 capsule (100 mg total) by mouth 3 (three) times daily. 90 capsule 11  . gabapentin (NEURONTIN) 400 MG capsule Take 1 capsule (400 mg total) by mouth 2 (two) times daily. 180 capsule 4  . losartan (COZAAR) 100 MG tablet TAKE ONE TABLET BY MOUTH ONE TIME DAILY 90 tablet 2  . metoprolol tartrate (LOPRESSOR) 50 MG tablet TAKE ONE TABLET BY MOUTH TWICE DAILY 180 tablet 2  . MYRBETRIQ 25 MG TB24 tablet Take 25 mg by mouth daily.    . nitroGLYCERIN (NITROSTAT) 0.4 MG SL tablet Place 1 tablet (0.4 mg total) under the tongue every 5 (five) minutes as needed for chest pain. 30 tablet 6  . omeprazole (PRILOSEC) 20 MG capsule Take 1 capsule (20 mg total) by mouth 2 (two) times daily before a meal. 60 capsule 5  . polyethylene glycol powder (GLYCOLAX/MIRALAX) powder Take 17 g by mouth daily. (Patient taking differently: Take 17 g by mouth every other day. ) 500 g 3  . rosuvastatin (CRESTOR) 40 MG tablet Take 1 tablet (40 mg total) by mouth daily. 90 tablet 3  . triamterene-hydrochlorothiazide (MAXZIDE-25) 37.5-25 MG tablet Take 0.5 tablets by mouth daily. (Patient not taking: Reported on 04/09/2020) 45 tablet 3   No current facility-administered medications on file prior to visit.     Past Medical History:  Diagnosis Date  . Allergy   . Anxiety   .  Arthritis    RA (Dr. Ouida Sills) Bilateral hands  . C. difficile diarrhea    history of  . CAD (coronary artery disease), native coronary artery    a. Cardiac CT with + FFR for Ramus b. cath 05/2017 100% sub acute lesion to mid circumflex artery s/p DES.  . Cancer ([place])    Right kidney CA removed.   . CKD (chronic kidney disease) stage 3, GFR 30-59 ml/min ([place]) 10/11/2016   s/p R nephrectomy  . Colon polyps 2008   HYPERPLASTIC  . Costochondritis    a. Nuc Stress Test 6/16: EF 70, no scar or ischemia, low risk // b. Echo 3/16: mild conc LVH, EF 65-70, no RWMA, Gr 1 DD, mild TR  . Diabetes mellitus without complication ([place])    type 2  no meds  . Dyspnea   . Gastritis   . GERD (gastroesophageal reflux disease)   . History of echocardiogram    Echo  3/18:  Moderate LVH, EF 13-08, grade 1 diastolic dysfunction, calcified aortic valve, mild MR, moderate LAE  . History of nuclear stress test    Myoview 3/18: Mod size and intensity fixed septal defect, may be artifact.Opposite mod size and intensity lat defect, which is reversible and could represent ischemia or possibly artifact (SDS 4). LVEF 71% with normal wall motion. Intermediate risk study. >> images reviewed with Dr. Dorris Carnes - no sig ischemia; med rx   . History of pneumonia   . Hx of cardiovascular stress test    Lexiscan Myoview 6/16:  EF 70%, no scar or ischemia; Low Risk  . Hyperlipidemia   . Hypertension   . Neuromuscular disorder ([place])    Neruopathy in bilateral feet  . Neuropathy   . Orthostatic hypotension 05/28/2017  . Osteoarthritis   . Pneumonia   . S/P angioplasty with stent 05/27/17 to LCX with DES  05/28/2017  . Thyroid disease    Allergies  Allergen Reactions  . Oxycodone-Acetaminophen Hives and Itching    hallucinations  . Percocet [Oxycodone-Acetaminophen] Hives, Itching and Other (See Comments)    hallucinations  . Pravastatin Sodium Other (See Comments)    Muscle aches Muscle aches  . Hydrocodone Other  (See Comments)    "crazy dreams"  . Aspirin Other (See Comments)    GI upset Other reaction(s): Other (See Comments) REACTION: GI upset    Social History   Socioeconomic History  . Marital status: Divorced    Spouse name: Not on file  . Number of children: 3  . Years of education: 20  . Highest education level: Not on file  Occupational History  . Occupation: Retired    Fish farm manager: RETIRED  Tobacco Use  . Smoking status: Current Every Day Smoker    Packs/day: 0.20    Years: 15.00    Pack years: 3.00    Types: Cigarettes  . Smokeless tobacco: Never Used  . Tobacco comment: 03/16/17  1 pk/week  Vaping Use  . Vaping Use: Never used  Substance and Sexual Activity  . Alcohol use: No    Alcohol/week: 0.0 standard drinks  . Drug use: No  . Sexual activity: Not on file  Other Topics Concern  . Not on file  Social History Narrative   Single, dgtr lives with her   Never Smoked    Alcohol use- no   Drug use-no   Regular Exercise-yes   Former Smoker- 12/2008   Social Determinants of Health   Financial Resource Strain:   . Difficulty of Paying Living Expenses: Not on file  Food Insecurity:   . Worried About Charity fundraiser in the Last Year: Not on file  . Ran Out of Food in the Last Year: Not on file  Transportation Needs:   . Lack of Transportation (Medical): Not on file  . Lack of Transportation (Non-Medical): Not on file  Physical Activity:   . Days of Exercise per Week: Not on file  . Minutes of Exercise per Session: Not on file  Stress:   . Feeling of Stress : Not on file  Social Connections:   . Frequency of Communication with Friends and Family: Not on file  . Frequency of Social Gatherings with Friends and Family: Not on file  . Attends Religious Services: Not on file  . Active Member of Clubs or Organizations: Not on file  . Attends Archivist Meetings: Not on file  . Marital Status: Not on file    Vitals:  06/09/20 1355  BP: 134/76   Pulse: 65  Resp: 16  Temp: 98.5 F (36.9 C)  SpO2: 95%   Body mass index is 37.28 kg/m.  Physical Exam Vitals and nursing note reviewed.  Constitutional:      General: She is not in acute distress.    Appearance: She is well-developed.  HENT:     Head: Normocephalic and atraumatic.     Mouth/Throat:     Mouth: Mucous membranes are moist.     Pharynx: Oropharynx is clear.  Eyes:     Conjunctiva/sclera: Conjunctivae normal.  Cardiovascular:     Rate and Rhythm: Normal rate and regular rhythm.     Pulses:          Dorsalis pedis pulses are 2+ on the right side and 2+ on the left side.     Heart sounds: No murmur heard.   Pulmonary:     Effort: Pulmonary effort is normal. No respiratory distress.     Breath sounds: Normal breath sounds.  Abdominal:     Palpations: Abdomen is soft. There is no hepatomegaly or mass.     Tenderness: There is no abdominal tenderness.  Musculoskeletal:     Lumbar back: Tenderness and bony tenderness present. Decreased range of motion.       Back:  Lymphadenopathy:     Cervical: No cervical adenopathy.  Skin:    General: Skin is warm.     Findings: No erythema or rash.  Neurological:     General: No focal deficit present.     Mental Status: She is alert and oriented to person, place, and time.     Cranial Nerves: No cranial nerve deficit.     Comments: Antalgic gait, is not assisted  Psychiatric:        Mood and Affect: Mood and affect normal.     Comments: Well groomed, good eye contact.   ASSESSMENT AND PLAN:  Ms. Arkie was seen today for fall.  Diagnoses and all orders for this visit: Orders Placed This Encounter  Procedures  . DG Lumbar Spine Complete  . Flu Vaccine QUAD High Dose(Fluad)   Fall in home, initial encounter We discussed fall precautions. She has a few risk factors, including chronic medical problems and medications.  Acute left-sided low back pain, unspecified whether sciatica present Acute exacerbation of  chronic back pain. For now I do not think she needs invasive procedure. We will do a lumbar x-ray here in the office today. Continue daily activities as tolerated. She was instructed about warning signs. If pain is not any better in 4 weeks or if it gets worse, appointment with neurosurgeon can be arranged. We discussed some side effects of tramadol, recommend taking it at bedtime. Local IcyHot/massage may also help.  -     traMADol (ULTRAM) 50 MG tablet; Take 1 tablet (50 mg total) by mouth daily as needed for up to 10 days.  Vitamin D deficiency, unspecified Complete Ergocalciferol 50,000 U weekly, one cap left, then she can continue vit D 1000 U daily.  Hypertension with heart disease We discussed some side effects of NSAID's, recommend avoiding them. Continue losartan 100 mg daily and metoprolol titrate 50 mg twice daily. Continue low-salt diet. Appointment with cardiologist tomorrow.  Hyperlipidemia associated with type 2 diabetes mellitus (HCC) LDL goal < 70. Continue Crestor 40 mg daily and Zetia 10 mg daily. We discussed the importance of following a low-fat diet. She may be a good candidate for PCSK9 inhibitors.  She is meeting with her cardiologist tomorrow.  Need for influenza vaccination - Flu Vaccine QUAD High Dose(Fluad)   Spent 40 minutes with pt.  During this time history was obtained and documented, examination was performed, prior labs/imaging reviewed, and assessment/plan discussed.  Return if symptoms worsen or fail to improve, for Keep next appt.   Josejulian Tarango G. [name], MD  [facility]. [place] office.   A few things to remember from today's visit:   Fall in home, initial encounter  Hypertension with heart disease  Acute left-sided low back pain, unspecified whether sciatica present - Plan: traMADol (ULTRAM) 50 MG tablet, DG Lumbar Spine Complete  Vitamin D deficiency, unspecified After finishing Vit D (Ergocalciferol 50,000 U)  Continue  vit D3 1000 U daily.  If you need refills please call your pharmacy. Do not use My Chart to request refills or for acute issues that need immediate attention.    Fall Prevention in the Home, Adult Falls can cause injuries. They can happen to people of all ages. There are many things you can do to make your home safe and to help prevent falls. Ask for help when making these changes, if needed. What actions can I take to prevent falls? General Instructions  Use good lighting in all rooms. Replace any light bulbs that burn out.  Turn on the lights when you go into a dark area. Use night-lights.  Keep items that you use often in easy-to-reach places. Lower the shelves around your home if necessary.  Set up your furniture so you have a clear path. Avoid moving your furniture around.  Do not have throw rugs and other things on the floor that can make you trip.  Avoid walking on wet floors.  If any of your floors are uneven, fix them.  Add color or contrast paint or tape to clearly mark and help you see: ? Any grab bars or handrails. ? First and last steps of stairways. ? Where the edge of each step is.  If you use a stepladder: ? Make sure that it is fully opened. Do not climb a closed stepladder. ? Make sure that both sides of the stepladder are locked into place. ? Ask someone to hold the stepladder for you while you use it.  If there are any pets around you, be aware of where they are. What can I do in the bathroom?      Keep the floor dry. Clean up any water that spills onto the floor as soon as it happens.  Remove soap buildup in the tub or shower regularly.  Use non-skid mats or decals on the floor of the tub or shower.  Attach bath mats securely with double-sided, non-slip rug tape.  If you need to sit down in the shower, use a plastic, non-slip stool.  Install grab bars by the toilet and in the tub and shower. Do not use towel bars as grab bars. What can I do in  the bedroom?  Make sure that you have a light by your bed that is easy to reach.  Do not use any sheets or blankets that are too big for your bed. They should not hang down onto the floor.  Have a firm chair that has side arms. You can use this for support while you get dressed. What can I do in the kitchen?  Clean up any spills right away.  If you need to reach something above you, use a strong step stool that has  a grab bar.  Keep electrical cords out of the way.  Do not use floor polish or wax that makes floors slippery. If you must use wax, use non-skid floor wax. What can I do with my stairs?  Do not leave any items on the stairs.  Make sure that you have a light switch at the top of the stairs and the bottom of the stairs. If you do not have them, ask someone to add them for you.  Make sure that there are handrails on both sides of the stairs, and use them. Fix handrails that are broken or loose. Make sure that handrails are as long as the stairways.  Install non-slip stair treads on all stairs in your home.  Avoid having throw rugs at the top or bottom of the stairs. If you do have throw rugs, attach them to the floor with carpet tape.  Choose a carpet that does not hide the edge of the steps on the stairway.  Check any carpeting to make sure that it is firmly attached to the stairs. Fix any carpet that is loose or worn. What can I do on the outside of my home?  Use bright outdoor lighting.  Regularly fix the edges of walkways and driveways and fix any cracks.  Remove anything that might make you trip as you walk through a door, such as a raised step or threshold.  Trim any bushes or trees on the path to your home.  Regularly check to see if handrails are loose or broken. Make sure that both sides of any steps have handrails.  Install guardrails along the edges of any raised decks and porches.  Clear walking paths of anything that might make someone trip, such as  tools or rocks.  Have any leaves, snow, or ice cleared regularly.  Use sand or salt on walking paths during winter.  Clean up any spills in your garage right away. This includes grease or oil spills. What other actions can I take?  Wear shoes that: ? Have a low heel. Do not wear high heels. ? Have rubber bottoms. ? Are comfortable and fit you well. ? Are closed at the toe. Do not wear open-toe sandals.  Use tools that help you move around (mobility aids) if they are needed. These include: ? Canes. ? Walkers. ? Scooters. ? Crutches.  Review your medicines with your doctor. Some medicines can make you feel dizzy. This can increase your chance of falling. Ask your doctor what other things you can do to help prevent falls. Where to find more information  Centers for Disease Control and Prevention, STEADI: https://garcia.biz/  [employer] on Aging: BrainJudge.co.uk Contact a doctor if:  You are afraid of falling at home.  You feel weak, drowsy, or dizzy at home.  You fall at home. Summary  There are many simple things that you can do to make your home safe and to help prevent falls.  Ways to make your home safe include removing tripping hazards and installing grab bars in the bathroom.  Ask for help when making these changes in your home. This information is not intended to replace advice given to you by your health care provider. Make sure you discuss any questions you have with your health care provider. Document Revised: 11/09/2018 Document Reviewed: 03/03/2017 Elsevier Patient Education  [place].  Please be sure medication list is accurate. If a new problem present, please set up appointment sooner than planned today.

## 2020-06-09 NOTE — Patient Instructions (Addendum)
A few things to remember from today's visit:   Fall in home, initial encounter  Hypertension with heart disease  Acute left-sided low back pain, unspecified whether sciatica present - Plan: traMADol (ULTRAM) 50 MG tablet, DG Lumbar Spine Complete  Vitamin D deficiency, unspecified After finishing Vit D (Ergocalciferol 50,000 U)  Continue vit D3 1000 U daily.  If you need refills please call your pharmacy. Do not use My Chart to request refills or for acute issues that need immediate attention.    Fall Prevention in the Home, Adult Falls can cause injuries. They can happen to people of all ages. There are many things you can do to make your home safe and to help prevent falls. Ask for help when making these changes, if needed. What actions can I take to prevent falls? General Instructions  Use good lighting in all rooms. Replace any light bulbs that burn out.  Turn on the lights when you go into a dark area. Use night-lights.  Keep items that you use often in easy-to-reach places. Lower the shelves around your home if necessary.  Set up your furniture so you have a clear path. Avoid moving your furniture around.  Do not have throw rugs and other things on the floor that can make you trip.  Avoid walking on wet floors.  If any of your floors are uneven, fix them.  Add color or contrast paint or tape to clearly mark and help you see: ? Any grab bars or handrails. ? First and last steps of stairways. ? Where the edge of each step is.  If you use a stepladder: ? Make sure that it is fully opened. Do not climb a closed stepladder. ? Make sure that both sides of the stepladder are locked into place. ? Ask someone to hold the stepladder for you while you use it.  If there are any pets around you, be aware of where they are. What can I do in the bathroom?      Keep the floor dry. Clean up any water that spills onto the floor as soon as it happens.  Remove soap buildup in  the tub or shower regularly.  Use non-skid mats or decals on the floor of the tub or shower.  Attach bath mats securely with double-sided, non-slip rug tape.  If you need to sit down in the shower, use a plastic, non-slip stool.  Install grab bars by the toilet and in the tub and shower. Do not use towel bars as grab bars. What can I do in the bedroom?  Make sure that you have a light by your bed that is easy to reach.  Do not use any sheets or blankets that are too big for your bed. They should not hang down onto the floor.  Have a firm chair that has side arms. You can use this for support while you get dressed. What can I do in the kitchen?  Clean up any spills right away.  If you need to reach something above you, use a strong step stool that has a grab bar.  Keep electrical cords out of the way.  Do not use floor polish or wax that makes floors slippery. If you must use wax, use non-skid floor wax. What can I do with my stairs?  Do not leave any items on the stairs.  Make sure that you have a light switch at the top of the stairs and the bottom of the stairs. If you  do not have them, ask someone to add them for you.  Make sure that there are handrails on both sides of the stairs, and use them. Fix handrails that are broken or loose. Make sure that handrails are as long as the stairways.  Install non-slip stair treads on all stairs in your home.  Avoid having throw rugs at the top or bottom of the stairs. If you do have throw rugs, attach them to the floor with carpet tape.  Choose a carpet that does not hide the edge of the steps on the stairway.  Check any carpeting to make sure that it is firmly attached to the stairs. Fix any carpet that is loose or worn. What can I do on the outside of my home?  Use bright outdoor lighting.  Regularly fix the edges of walkways and driveways and fix any cracks.  Remove anything that might make you trip as you walk through a door,  such as a raised step or threshold.  Trim any bushes or trees on the path to your home.  Regularly check to see if handrails are loose or broken. Make sure that both sides of any steps have handrails.  Install guardrails along the edges of any raised decks and porches.  Clear walking paths of anything that might make someone trip, such as tools or rocks.  Have any leaves, snow, or ice cleared regularly.  Use sand or salt on walking paths during winter.  Clean up any spills in your garage right away. This includes grease or oil spills. What other actions can I take?  Wear shoes that: ? Have a low heel. Do not wear high heels. ? Have rubber bottoms. ? Are comfortable and fit you well. ? Are closed at the toe. Do not wear open-toe sandals.  Use tools that help you move around (mobility aids) if they are needed. These include: ? Canes. ? Walkers. ? Scooters. ? Crutches.  Review your medicines with your doctor. Some medicines can make you feel dizzy. This can increase your chance of falling. Ask your doctor what other things you can do to help prevent falls. Where to find more information  Centers for Disease Control and Prevention, STEADI: https://garcia.biz/  [employer] on Aging: BrainJudge.co.uk Contact a doctor if:  You are afraid of falling at home.  You feel weak, drowsy, or dizzy at home.  You fall at home. Summary  There are many simple things that you can do to make your home safe and to help prevent falls.  Ways to make your home safe include removing tripping hazards and installing grab bars in the bathroom.  Ask for help when making these changes in your home. This information is not intended to replace advice given to you by your health care provider. Make sure you discuss any questions you have with your health care provider. Document Revised: 11/09/2018 Document Reviewed: 03/03/2017 Elsevier Patient Education  [place].  Please  be sure medication list is accurate. If a new problem present, please set up appointment sooner than planned today.

## 2020-06-09 NOTE — Progress Notes (Signed)
Patient ID: [name]                 DOB: August 11, 1945                    MRN: [id number]     HPI: [name] is a 75 y.o. female patient referred to lipid clinic by Dr. Harrington Challenger. PMH is significant for CAD s/p PTCA/DES to LCx, CT showed + FFR showed LAD 20%; Ramus 50%;  RCA 20% LCx 100%; HTN, HLD, DM, CKD-3 s/p right nephrectomy, RA, OSA.  Pateint presents today in good spirits ambulating with a cane and a family member. Reports joint pain with rosuvastatin so has been taking it off/on for ~1 month. Reports not missing any doses of ezetimibe.   Current Medications: Rosuvastatin 40 mg daily (taking off/on), ezetimbe 10 mg daily Intolerances: pravastatin (muscle aches) Risk Factors: T2DM, CAD s/p stenting, CKD-3, tobacco use LDL goal: <55 mg/dL  Diet:  -Breakfast: banana, muffins, strip a bacon, egg, grits, oatmeal -Lunch: skips due to eating a late breakfast -Dinner: pasta, fried food, baked food, fish, chicken, vegetables, [place] meatloaf, limit red meats -Drinks: pepsi, water, cranberry juice, 1 cup of coffee  Exercise: none limited due to cane and chronic back pain   Family History: Cancer in her brother; Dementia in her brother and brother; Diabetes in her brother and brother; Heart disease in her father; Kidney disease in her son; Parkinsonism in her brother; Prostate cancer in her father; Rheum arthritis in her mother; Stroke in her mother.   Social History: current every day smoker; denies smokeless tobacco use; denies alcohol use; denies illicit drug use  Labs: 06/04/20: LDL 106, TC 181, TG 129, HDL 52 (rosuvastatin, ezetimibe) 03/26/20: LDL 98, TC 171, TG 122, HDL 51 (rosuvastatin)  Past Medical History:  Diagnosis Date  . Allergy   . Anxiety   . Arthritis    RA (Dr. Ouida Sills) Bilateral hands  . C. difficile diarrhea    history of  . CAD (coronary artery disease), native coronary artery    a. Cardiac CT with + FFR for Ramus b. cath 05/2017 100% sub acute lesion to mid  circumflex artery s/p DES.  . Cancer ([place])    Right kidney CA removed.   . CKD (chronic kidney disease) stage 3, GFR 30-59 ml/min ([place]) 10/11/2016   s/p R nephrectomy  . Colon polyps 2008   HYPERPLASTIC  . Costochondritis    a. Nuc Stress Test 6/16: EF 70, no scar or ischemia, low risk // b. Echo 3/16: mild conc LVH, EF 65-70, no RWMA, Gr 1 DD, mild TR  . Diabetes mellitus without complication ([place])    type 2  no meds  . Dyspnea   . Gastritis   . GERD (gastroesophageal reflux disease)   . History of echocardiogram    Echo 3/18:  Moderate LVH, EF 85-46, grade 1 diastolic dysfunction, calcified aortic valve, mild MR, moderate LAE  . History of nuclear stress test    Myoview 3/18: Mod size and intensity fixed septal defect, may be artifact.Opposite mod size and intensity lat defect, which is reversible and could represent ischemia or possibly artifact (SDS 4). LVEF 71% with normal wall motion. Intermediate risk study. >> images reviewed with Dr. Dorris Carnes - no sig ischemia; med rx   . History of pneumonia   . Hx of cardiovascular stress test    Lexiscan Myoview 6/16:  EF 70%, no scar or ischemia; Low Risk  .  Hyperlipidemia   . Hypertension   . Neuromuscular disorder ([place])    Neruopathy in bilateral feet  . Neuropathy   . Orthostatic hypotension 05/28/2017  . Osteoarthritis   . Pneumonia   . S/P angioplasty with stent 05/27/17 to LCX with DES  05/28/2017  . Thyroid disease     Current Outpatient Medications on File Prior to Visit  Medication Sig Dispense Refill  . acetaminophen (TYLENOL) 500 MG tablet Take 1,000 mg by mouth as needed for moderate pain or headache.     . albuterol (PROAIR HFA) 108 (90 Base) MCG/ACT inhaler Inhale 1-2 puffs into the lungs every 6 (six) hours as needed for wheezing or shortness of breath. 8 g 0  . aspirin 81 MG chewable tablet Chew 81 mg by mouth daily.    . colchicine 0.6 MG tablet Take 0.5 tablets (0.3 mg total) by mouth daily. 45 tablet 0  .  diclofenac sodium (VOLTAREN) 1 % GEL Apply 2 g topically as needed (for arm pain).  (Patient not taking: Reported on 04/09/2020)    . DULoxetine (CYMBALTA) 30 MG capsule Take 1 capsule (30 mg total) by mouth daily. 90 capsule 1  . ezetimibe (ZETIA) 10 MG tablet Take 1 tablet (10 mg total) by mouth daily. 90 tablet 3  . fluticasone (FLONASE) 50 MCG/ACT nasal spray Place 2 sprays into both nostrils at bedtime as needed for allergies. 48 mL 3  . gabapentin (NEURONTIN) 100 MG capsule Take 1 capsule (100 mg total) by mouth 3 (three) times daily. 90 capsule 11  . gabapentin (NEURONTIN) 400 MG capsule Take 1 capsule (400 mg total) by mouth 2 (two) times daily. 180 capsule 4  . losartan (COZAAR) 100 MG tablet TAKE ONE TABLET BY MOUTH ONE TIME DAILY 90 tablet 2  . metoprolol tartrate (LOPRESSOR) 50 MG tablet TAKE ONE TABLET BY MOUTH TWICE DAILY 180 tablet 2  . MYRBETRIQ 25 MG TB24 tablet Take 25 mg by mouth daily.    . nitroGLYCERIN (NITROSTAT) 0.4 MG SL tablet Place 1 tablet (0.4 mg total) under the tongue every 5 (five) minutes as needed for chest pain. 30 tablet 6  . omeprazole (PRILOSEC) 20 MG capsule Take 1 capsule (20 mg total) by mouth 2 (two) times daily before a meal. 60 capsule 5  . polyethylene glycol powder (GLYCOLAX/MIRALAX) powder Take 17 g by mouth daily. (Patient taking differently: Take 17 g by mouth every other day. ) 500 g 3  . rosuvastatin (CRESTOR) 40 MG tablet Take 1 tablet (40 mg total) by mouth daily. 90 tablet 3  . traMADol (ULTRAM) 50 MG tablet Take 1 tablet (50 mg total) by mouth daily as needed for up to 10 days. 10 tablet 0  . triamterene-hydrochlorothiazide (MAXZIDE-25) 37.5-25 MG tablet Take 0.5 tablets by mouth daily. (Patient not taking: Reported on 04/09/2020) 45 tablet 3   No current facility-administered medications on file prior to visit.    Allergies  Allergen Reactions  . Oxycodone-Acetaminophen Hives and Itching    hallucinations  . Percocet [Oxycodone-Acetaminophen]  Hives, Itching and Other (See Comments)    hallucinations  . Pravastatin Sodium Other (See Comments)    Muscle aches Muscle aches  . Hydrocodone Other (See Comments)    "crazy dreams"  . Aspirin Other (See Comments)    GI upset Other reaction(s): Other (See Comments) REACTION: GI upset    Assessment/Plan:  1. Hyperlipidemia - LDL above goal <55 mg/dL due to CAD s/p stenting, T2DM, CKD-3, and tobacco use. Patient experiencing joint  pain with rosuvastatin 40 mg daily, however tolerating ezetimibe well. Will decrease rosuvastatin to 20 mg daily to reduce risk of joint pain and continue ezetimibe 10 mg daily. Additionally, discussed Praluent's clinicial benefits, potential side-effects, and proper injection technique with teach-back method to patient and family member. PA has been approved and copay cost is $47 for 30-day supply. Will start Praluent 75 mg subcutaneously every 14 days and call patient tomorrow to schedule follow-up fasting lipid panel and LFTs in 3 months. Additionally, patient may be a candidate for the [facility] clinical trial if copay-cost is prohibitive. Discussed healthy diets consisting of vegetables, fruit and lean meats (chicken, [place], fish) and to limit salt intake. Encouraged patient to exercise to the best of her abilities.   [name], PharmD PGY2 [facility] [address], [place], [place] [address] Phone: [phone]; Fax: [phone]

## 2020-06-10 ENCOUNTER — Ambulatory Visit (INDEPENDENT_AMBULATORY_CARE_PROVIDER_SITE_OTHER): Payer: Medicare HMO | Admitting: Pharmacist

## 2020-06-10 DIAGNOSIS — E785 Hyperlipidemia, unspecified: Secondary | ICD-10-CM

## 2020-06-10 DIAGNOSIS — E1169 Type 2 diabetes mellitus with other specified complication: Secondary | ICD-10-CM | POA: Diagnosis not present

## 2020-06-10 MED ORDER — PRALUENT 75 MG/ML ~~LOC~~ SOAJ: 75.0000 mg | SUBCUTANEOUS | 11 refills | Status: DC

## 2020-06-10 MED ORDER — ROSUVASTATIN CALCIUM 20 MG PO TABS: 20.0000 mg | ORAL_TABLET | Freq: Every day | ORAL | 11 refills | Status: DC

## 2020-06-10 NOTE — Patient Instructions (Signed)
Nice to see you today!  Keep up the good work with diet and exercise. Aim for a diet full of vegetables, fruit and lean meats (chicken, [place], fish). Try to limit carbs (bread, pasta, sugar, rice) and red meat consumption.  Your goal LDL is 55mg /dL, you're currently at 106mg /dL  Medication Changes: Begin taking Rosuvastatin 20 mg daily  Continue taking Ezetimibe 10 mg daily  I will give you a call once your insurance approves Praluent and let you know the copay cost and when it will be ready for pick up and then schedule fasting labs in 3 months -You will inject Praluent 75 mg subcutaneously into your skin every 14 days.  For [id number], inject once every other week (any day of the week that works for you) into the fatty skin of stomach, upper outer thigh or back of the arm. Clean the site with soap and warm water or an alcohol pad. Keep the medication in the fridge until you are ready to give your dose, then take it out and let warm up to room temperature for 30-60 mins.  Please give [place] a call at [phone] with any questions or concerns.

## 2020-06-11 ENCOUNTER — Telehealth: Payer: Self-pay | Admitting: Pharmacist

## 2020-06-11 DIAGNOSIS — E785 Hyperlipidemia, unspecified: Secondary | ICD-10-CM

## 2020-06-11 NOTE — Telephone Encounter (Signed)
Contacted patient regarding hyperlipidemia management. Informed patient Praluent copay cost will be $47 per month at [facility]. Patient verbalized understanding. Scheduled fasting lipid panel and LFTs in 3 months. All questions were answered.

## 2020-06-16 ENCOUNTER — Other Ambulatory Visit: Payer: Self-pay | Admitting: Family Medicine

## 2020-06-16 DIAGNOSIS — J3089 Other allergic rhinitis: Secondary | ICD-10-CM

## 2020-06-19 DIAGNOSIS — M4316 Spondylolisthesis, lumbar region: Secondary | ICD-10-CM | POA: Diagnosis not present

## 2020-06-28 ENCOUNTER — Other Ambulatory Visit: Payer: Self-pay | Admitting: Family Medicine

## 2020-06-28 ENCOUNTER — Other Ambulatory Visit: Payer: Self-pay | Admitting: Internal Medicine

## 2020-07-04 ENCOUNTER — Telehealth: Payer: Self-pay | Admitting: Internal Medicine

## 2020-07-04 NOTE — Telephone Encounter (Signed)
Left message on VM to call back and adv that there is on call provider available to speak with patient/daughter if needed over the weekend.

## 2020-07-04 NOTE — Telephone Encounter (Signed)
Patient's daughter states the patient has been very fatigued. She states she has no energy for the simplest of tasks, but has no other symptoms. She states she has mever seen her like this and is not sure if it the new medication she started. She states she does not know the name of the medication. Please advise.

## 2020-07-07 ENCOUNTER — Other Ambulatory Visit: Payer: Self-pay

## 2020-07-07 ENCOUNTER — Encounter: Payer: Self-pay | Admitting: Family Medicine

## 2020-07-07 ENCOUNTER — Ambulatory Visit (INDEPENDENT_AMBULATORY_CARE_PROVIDER_SITE_OTHER): Payer: Medicare HMO | Admitting: Family Medicine

## 2020-07-07 ENCOUNTER — Ambulatory Visit (INDEPENDENT_AMBULATORY_CARE_PROVIDER_SITE_OTHER): Payer: Medicare HMO

## 2020-07-07 VITALS — BP 124/76 | HR 68 | Temp 98.1°F | Resp 16 | Ht 66.0 in | Wt 236.4 lb

## 2020-07-07 DIAGNOSIS — R0989 Other specified symptoms and signs involving the circulatory and respiratory systems: Secondary | ICD-10-CM

## 2020-07-07 DIAGNOSIS — G4730 Sleep apnea, unspecified: Secondary | ICD-10-CM | POA: Diagnosis not present

## 2020-07-07 DIAGNOSIS — R0602 Shortness of breath: Secondary | ICD-10-CM | POA: Diagnosis not present

## 2020-07-07 DIAGNOSIS — R062 Wheezing: Secondary | ICD-10-CM | POA: Diagnosis not present

## 2020-07-07 DIAGNOSIS — F419 Anxiety disorder, unspecified: Secondary | ICD-10-CM

## 2020-07-07 DIAGNOSIS — M48062 Spinal stenosis, lumbar region with neurogenic claudication: Secondary | ICD-10-CM | POA: Diagnosis not present

## 2020-07-07 DIAGNOSIS — R5383 Other fatigue: Secondary | ICD-10-CM | POA: Diagnosis not present

## 2020-07-07 DIAGNOSIS — R69 Illness, unspecified: Secondary | ICD-10-CM | POA: Diagnosis not present

## 2020-07-07 DIAGNOSIS — E559 Vitamin D deficiency, unspecified: Secondary | ICD-10-CM | POA: Diagnosis not present

## 2020-07-07 DIAGNOSIS — G4700 Insomnia, unspecified: Secondary | ICD-10-CM | POA: Diagnosis not present

## 2020-07-07 MED ORDER — ALBUTEROL SULFATE HFA 108 (90 BASE) MCG/ACT IN AERS: 1.0000 | INHALATION_SPRAY | Freq: Four times a day (QID) | RESPIRATORY_TRACT | 0 refills | Status: DC | PRN

## 2020-07-07 MED ORDER — DULOXETINE HCL 30 MG PO CPEP: 30.0000 mg | ORAL_CAPSULE | Freq: Every day | ORAL | 1 refills | Status: DC

## 2020-07-07 NOTE — Progress Notes (Signed)
Chief Complaint  Patient presents with  . Fatigue   HPI: [name] is a 74 y.o. female, who is here today with above complaint. Her daughter is concerned and arranged this appt for her. Some days she feels like she has no energy, it is not daily. Problem has been going on for months, intermittently.  Not sure about exacerbating or alleviating factors. Negative for fever,night sweats,abnormal wt loss,abdominal pain,N/V,and urinary symptoms.  She is sleeping about 5-6 hours. She falls asleep on a chair for an hour, wakes up and watches TV until 1-2 Am, then goes to bed. Usually gets up around 7:30-8:30 am.  She is under some stress. Son has many health issues but seems to be getting better, hx of ESRD on dialysis. She is her son's caregiver. She "is not happy" with her daughter, she does not approve her boyfriend. Her daughter has hx of bipolar, she doing well on medication but if she is having problems with boyfriend she "shots down." This situation aggravates her stress.  She started new medication for HLD to take q 14 days,Praluent 75 mg. She is also on Crestor 20 mg daily.  Lab Results  Component Value Date   CHOL 181 06/04/2020   HDL 52 06/04/2020   LDLCALC 106 (H) 06/04/2020   LDLDIRECT 171.5 12/06/2011   TRIG 129 06/04/2020   CHOLHDL 3.5 06/04/2020    Chronic pain: upper and lower back pain + joint pain. She has seen neurosurgeon since her last visit,recommended local ice and heat. Back pain is better. Lower back exacerbated after fall on 06/02/20.  Lab Results  Component Value Date   WBC 7.5 02/23/2020   HGB 14.9 02/23/2020   HCT 46.4 (H) 02/23/2020   MCV 93.9 02/23/2020   PLT 242 02/23/2020   OSA: She had a sleep study already.She was supposed to have it done again, about 2 years ago.  Lab Results  Component Value Date   TSH 1.87 09/26/2019   She is taking taking Cymbalta 30 mg daily for generalized OA and anxiety. She does not take medication daily  but just when she feels more anxious. Polyneuropathy on Gabapentin. She follows with neurologist.  Vit D deficiency: She is not on Vit D supplementation. Last 25 OH vit D 28 on 03/26/20.  HTN: On [name] 37.5-25 mg daily, losartan 100 mg,and Metoprolol Tartrate 50 mg bid.  Lab Results  Component Value Date   CREATININE 1.52 (H) 02/23/2020   BUN 20 02/23/2020   NA 140 02/23/2020   K 4.3 02/23/2020   CL 105 02/23/2020   CO2 26 02/23/2020   Today I noted mild bilateral rales. Negative for SOB,cough, CP. She has had occasional wheezing. + Smoker.  Review of Systems  Constitutional: Negative for activity change and appetite change.  HENT: Negative for mouth sores, nosebleeds and sore throat.   Eyes: Negative for redness and visual disturbance.  Cardiovascular: Negative for palpitations and leg swelling.  Gastrointestinal: Negative for abdominal pain, nausea and vomiting.       Negative for changes in bowel habits.  Genitourinary: Negative for decreased urine volume, dysuria and hematuria.  Skin: Negative for pallor and rash.  Allergic/Immunologic: Positive for environmental allergies.  Neurological: Positive for numbness. Negative for syncope, weakness and headaches.  Psychiatric/Behavioral: Negative for confusion. The patient is nervous/anxious.   Rest see pertinent positives and negatives per HPI.  Current Outpatient Medications on File Prior to Visit  Medication Sig Dispense Refill  . acetaminophen (TYLENOL) 500 MG tablet  Take 1,000 mg by mouth as needed for moderate pain or headache.     . Alirocumab (PRALUENT) 75 MG/ML SOAJ Inject 75 mg into the skin every 14 (fourteen) days. 2 mL 11  . aspirin 81 MG chewable tablet Chew 81 mg by mouth daily.    . colchicine 0.6 MG tablet Take 0.5 tablets (0.3 mg total) by mouth daily. 45 tablet 0  . diclofenac sodium (VOLTAREN) 1 % GEL Apply 2 g topically as needed (for arm pain).  (Patient not taking: Reported on 04/09/2020)    .  ezetimibe (ZETIA) 10 MG tablet Take 1 tablet (10 mg total) by mouth daily. 90 tablet 3  . fluticasone (FLONASE) 50 MCG/ACT nasal spray PLACE 2 SPRAYS INTO BOTH NOSTRILS AT BEDTIME AS NEEDED FOR ALLERGIES. 48 mL 1  . gabapentin (NEURONTIN) 100 MG capsule Take 1 capsule (100 mg total) by mouth 3 (three) times daily. 90 capsule 11  . gabapentin (NEURONTIN) 400 MG capsule Take 1 capsule (400 mg total) by mouth 2 (two) times daily. 180 capsule 4  . losartan (COZAAR) 100 MG tablet TAKE ONE TABLET BY MOUTH ONE TIME DAILY 90 tablet 2  . metoprolol tartrate (LOPRESSOR) 50 MG tablet TAKE ONE TABLET BY MOUTH TWICE DAILY 180 tablet 2  . MYRBETRIQ 25 MG TB24 tablet Take 25 mg by mouth daily.    . nitroGLYCERIN (NITROSTAT) 0.4 MG SL tablet Place 1 tablet (0.4 mg total) under the tongue every 5 (five) minutes as needed for chest pain. 30 tablet 6  . omeprazole (PRILOSEC) 20 MG capsule Take 1 capsule (20 mg total) by mouth 2 (two) times daily before a meal. 60 capsule 5  . polyethylene glycol powder (GLYCOLAX/MIRALAX) powder Take 17 g by mouth daily. (Patient taking differently: Take 17 g by mouth every other day. ) 500 g 3  . rosuvastatin (CRESTOR) 20 MG tablet Take 1 tablet (20 mg total) by mouth daily. 30 tablet 11  . triamterene-hydrochlorothiazide (MAXZIDE-25) 37.5-25 MG tablet TAKE HALF TABLET BY MOUTH DAILY 45 tablet 2   No current facility-administered medications on file prior to visit.   Past Medical History:  Diagnosis Date  . Allergy   . Anxiety   . Arthritis    RA (Dr. Ouida Sills) Bilateral hands  . C. difficile diarrhea    history of  . CAD (coronary artery disease), native coronary artery    a. Cardiac CT with + FFR for Ramus b. cath 05/2017 100% sub acute lesion to mid circumflex artery s/p DES.  . Cancer ([place])    Right kidney CA removed.   . CKD (chronic kidney disease) stage 3, GFR 30-59 ml/min ([place]) 10/11/2016   s/p R nephrectomy  . Colon polyps 2008   HYPERPLASTIC  . Costochondritis     a. Nuc Stress Test 6/16: EF 70, no scar or ischemia, low risk // b. Echo 3/16: mild conc LVH, EF 65-70, no RWMA, Gr 1 DD, mild TR  . Diabetes mellitus without complication ([place])    type 2  no meds  . Dyspnea   . Gastritis   . GERD (gastroesophageal reflux disease)   . History of echocardiogram    Echo 3/18:  Moderate LVH, EF 53-97, grade 1 diastolic dysfunction, calcified aortic valve, mild MR, moderate LAE  . History of nuclear stress test    Myoview 3/18: Mod size and intensity fixed septal defect, may be artifact.Opposite mod size and intensity lat defect, which is reversible and could represent ischemia or possibly artifact (SDS 4).  LVEF 71% with normal wall motion. Intermediate risk study. >> images reviewed with Dr. Dorris Carnes - no sig ischemia; med rx   . History of pneumonia   . Hx of cardiovascular stress test    Lexiscan Myoview 6/16:  EF 70%, no scar or ischemia; Low Risk  . Hyperlipidemia   . Hypertension   . Neuromuscular disorder ([place])    Neruopathy in bilateral feet  . Neuropathy   . Orthostatic hypotension 05/28/2017  . Osteoarthritis   . Pneumonia   . S/P angioplasty with stent 05/27/17 to LCX with DES  05/28/2017  . Thyroid disease    Allergies  Allergen Reactions  . Oxycodone-Acetaminophen Hives and Itching    hallucinations  . Percocet [Oxycodone-Acetaminophen] Hives, Itching and Other (See Comments)    hallucinations  . Pravastatin Sodium Other (See Comments)    Muscle aches Muscle aches  . Hydrocodone Other (See Comments)    "crazy dreams"  . Aspirin Other (See Comments)    GI upset Other reaction(s): Other (See Comments) REACTION: GI upset    Social History   Socioeconomic History  . Marital status: Divorced    Spouse name: Not on file  . Number of children: 3  . Years of education: 74  . Highest education level: Not on file  Occupational History  . Occupation: Retired    Fish farm manager: RETIRED  Tobacco Use  . Smoking status: Current Every Day  Smoker    Packs/day: 0.20    Years: 15.00    Pack years: 3.00    Types: Cigarettes  . Smokeless tobacco: Never Used  . Tobacco comment: 03/16/17  1 pk/week  Vaping Use  . Vaping Use: Never used  Substance and Sexual Activity  . Alcohol use: No    Alcohol/week: 0.0 standard drinks  . Drug use: No  . Sexual activity: Not on file  Other Topics Concern  . Not on file  Social History Narrative   Single, dgtr lives with her   Never Smoked    Alcohol use- no   Drug use-no   Regular Exercise-yes   Former Smoker- 12/2008   Social Determinants of Health   Financial Resource Strain: Not on file  Food Insecurity: Not on file  Transportation Needs: Not on file  Physical Activity: Not on file  Stress: Not on file  Social Connections: Not on file   Vitals:   07/07/20 1003  BP: 124/76  Pulse: 68  Resp: 16  Temp: 98.1 F (36.7 C)  SpO2: 94%   Body mass index is 38.16 kg/m.  Physical Exam Vitals and nursing note reviewed.  Constitutional:      General: She is not in acute distress.    Appearance: She is well-developed.  HENT:     Head: Normocephalic and atraumatic.     Mouth/Throat:     Mouth: Mucous membranes are moist.     Pharynx: Oropharynx is clear.  Eyes:     Conjunctiva/sclera: Conjunctivae normal.     Pupils: Pupils are equal, round, and reactive to light.  Cardiovascular:     Rate and Rhythm: Normal rate and regular rhythm.     Heart sounds: No murmur heard.   Pulmonary:     Effort: Pulmonary effort is normal. No respiratory distress.     Breath sounds: Rales present.  Abdominal:     Palpations: Abdomen is soft. There is no hepatomegaly or mass.     Tenderness: There is no abdominal tenderness.  Lymphadenopathy:     Cervical:  No cervical adenopathy.  Skin:    General: Skin is warm.     Findings: No erythema or rash.  Neurological:     General: No focal deficit present.     Mental Status: She is alert and oriented to person, place, and time.      Cranial Nerves: No cranial nerve deficit.     Gait: Gait normal.  Psychiatric:     Comments: Well groomed, good eye contact.   ASSESSMENT AND PLAN:  [name] was seen today for fatigue.  Diagnoses and all orders for this visit: Orders Placed This Encounter  Procedures  . DG Chest 2 View  . CBC with Differential/Platelet  . BASIC METABOLIC PANEL WITH GFR  . Nocturnal polysomnography   Lab Results  Component Value Date   WBC 6.9 07/07/2020   HGB 14.6 07/07/2020   HCT 44.5 07/07/2020   MCV 91.2 07/07/2020   PLT 231 07/07/2020   Lab Results  Component Value Date   CREATININE 1.47 (H) 07/07/2020   BUN 22 07/07/2020   NA 139 07/07/2020   K 4.5 07/07/2020   CL 104 07/07/2020   CO2 29 07/07/2020    Fatigue, unspecified type We discussed possible etiologies: Systemic illness, immunologic,endocrinology,sleep disorder, psychiatric/psychologic, infectious,medications side effects, and idiopathic. Examination today does not suggest a serious process. A few of her chronic medical problems and medications can be contributing factors. Healthy diet and regular physical activity may help.  Further recommendations will be given according to lab results.  Vitamin D deficiency, unspecified OTC Vit D 2000 U daily to start.  Insomnia, unspecified type Good sleep hygiene recommended. If aggravated by anxiety, Cymbalta will help. OTC Melatonin 5-15 mg 1-2 hours before bedtime may help.  Wheezing Lung auscultation negative for wheezing today. ? COPD. Smoking cessation encouraged. Albuterol inh 2 puff every 6 hours for a week then as needed for wheezing or shortness of breath.   -     albuterol (PROAIR HFA) 108 (90 Base) MCG/ACT inhaler; Inhale 1-2 puffs into the lungs every 6 (six) hours as needed for wheezing or shortness of breath.  Sleep apnea, unspecified type She did not complete studies. Sleep study will be arranged. Wt loss will also help.  Bilateral  rales Asymptomatic. Further recommendations according to CXR. Monitoring for new symptoms.  Anxiety disorder, unspecified type Cymbalta 30 mg daily to be resume.  -     DULoxetine (CYMBALTA) 30 MG capsule; Take 1 capsule (30 mg total) by mouth daily.  Spinal stenosis, lumbar region, with neurogenic claudication Acute exacerbation after fall improved. Resume Cymbalta 30 mg daily. Following with neurosurgeon.  -     DULoxetine (CYMBALTA) 30 MG capsule; Take 1 capsule (30 mg total) by mouth daily.  Spent 44 minutes.  During this time history was obtained and documented, examination was performed, prior labs/imaging reviewed, and assessment/plan discussed.  Return in about 6 weeks (around 08/18/2020).   Joby Richart G. [name], MD  [facility]. [place] office.   A few things to remember from today's visit:   Vitamin D deficiency, unspecified  Fatigue, unspecified type  Insomnia, unspecified type  Wheezing  Sleep apnea, unspecified type - Plan: Nocturnal polysomnography  Bilateral rales - Plan: CBC with Differential/Platelet, DG Chest 2 View  If you need refills please call your pharmacy. Do not use My Chart to request refills or for acute issues that need immediate attention.   Albuterol inh 2 puff every 6 hours for a week then as needed for wheezing or shortness  of breath.  Vit D 2000 U daily.  Take Cymbalta daily.  Please be sure medication list is accurate. If a new problem present, please set up appointment sooner than planned today.

## 2020-07-07 NOTE — Patient Instructions (Signed)
A few things to remember from today's visit:   Vitamin D deficiency, unspecified  Fatigue, unspecified type  Insomnia, unspecified type  Wheezing  Sleep apnea, unspecified type - Plan: Nocturnal polysomnography  Bilateral rales - Plan: CBC with Differential/Platelet, DG Chest 2 View  If you need refills please call your pharmacy. Do not use My Chart to request refills or for acute issues that need immediate attention.   Albuterol inh 2 puff every 6 hours for a week then as needed for wheezing or shortness of breath.  Vit D 2000 U daily.  Take Cymbalta daily.  Please be sure medication list is accurate. If a new problem present, please set up appointment sooner than planned today.

## 2020-07-08 LAB — BASIC METABOLIC PANEL WITH GFR
BUN/Creatinine Ratio: 15 (calc) (ref 6–22)
BUN: 22 mg/dL (ref 7–25)
CO2: 29 mmol/L (ref 20–32)
Calcium: 10 mg/dL (ref 8.6–10.4)
Chloride: 104 mmol/L (ref 98–110)
Creat: 1.47 mg/dL — ABNORMAL HIGH (ref 0.60–0.93)
GFR, Est African American: 40 mL/min/{1.73_m2} — ABNORMAL LOW (ref >=60)
GFR, Est Non African American: 34 mL/min/{1.73_m2} — ABNORMAL LOW (ref >=60)
Glucose, Bld: 92 mg/dL (ref 65–99)
Potassium: 4.5 mmol/L (ref 3.5–5.3)
Sodium: 139 mmol/L (ref 135–146)

## 2020-07-08 LAB — CBC WITH DIFFERENTIAL/PLATELET
Absolute Monocytes: 621 cells/uL (ref 200–950)
Basophils Absolute: 83 cells/uL (ref 0–200)
Basophils Relative: 1.2 %
Eosinophils Absolute: 449 cells/uL (ref 15–500)
Eosinophils Relative: 6.5 %
HCT: 44.5 % (ref 35.0–45.0)
Hemoglobin: 14.6 g/dL (ref 11.7–15.5)
Lymphs Abs: 2836 cells/uL (ref 850–3900)
MCH: 29.9 pg (ref 27.0–33.0)
MCHC: 32.8 g/dL (ref 32.0–36.0)
MCV: 91.2 fL (ref 80.0–100.0)
MPV: 11.7 fL (ref 7.5–12.5)
Monocytes Relative: 9 %
Neutro Abs: 2912 cells/uL (ref 1500–7800)
Neutrophils Relative %: 42.2 %
Platelets: 231 10*3/uL (ref 140–400)
RBC: 4.88 10*6/uL (ref 3.80–5.10)
RDW: 14.1 % (ref 11.0–15.0)
Total Lymphocyte: 41.1 %
WBC: 6.9 10*3/uL (ref 3.8–10.8)

## 2020-07-29 DIAGNOSIS — Z20828 Contact with and (suspected) exposure to other viral communicable diseases: Secondary | ICD-10-CM | POA: Diagnosis not present

## 2020-08-04 ENCOUNTER — Ambulatory Visit: Payer: Medicare HMO | Admitting: Podiatry

## 2020-08-06 ENCOUNTER — Other Ambulatory Visit: Payer: Self-pay

## 2020-08-06 DIAGNOSIS — Z20822 Contact with and (suspected) exposure to covid-19: Secondary | ICD-10-CM

## 2020-08-08 ENCOUNTER — Other Ambulatory Visit: Payer: Self-pay | Admitting: Family Medicine

## 2020-08-08 DIAGNOSIS — R062 Wheezing: Secondary | ICD-10-CM

## 2020-08-08 LAB — NOVEL CORONAVIRUS, NAA: SARS-CoV-2, NAA: NOT DETECTED

## 2020-08-08 LAB — SARS-COV-2, NAA 2 DAY TAT

## 2020-08-09 ENCOUNTER — Other Ambulatory Visit: Payer: Self-pay

## 2020-08-09 ENCOUNTER — Ambulatory Visit
Admission: RE | Admit: 2020-08-09 | Discharge: 2020-08-09 | Disposition: A | Discharge status: Home / Self Care | Payer: Medicare HMO | Source: Ambulatory Visit | Attending: Emergency Medicine | Admitting: Emergency Medicine

## 2020-08-09 VITALS — BP 139/84 | HR 72 | Temp 97.9°F | Resp 18

## 2020-08-09 DIAGNOSIS — J069 Acute upper respiratory infection, unspecified: Secondary | ICD-10-CM | POA: Diagnosis not present

## 2020-08-09 MED ORDER — BENZONATATE 200 MG PO CAPS: 200.0000 mg | ORAL_CAPSULE | Freq: Three times a day (TID) | ORAL | 0 refills | Status: DC | PRN

## 2020-08-09 MED ORDER — BENZONATATE 200 MG PO CAPS: 200.0000 mg | ORAL_CAPSULE | Freq: Three times a day (TID) | ORAL | 0 refills | Status: AC | PRN

## 2020-08-09 NOTE — Discharge Instructions (Signed)
Lungs clear on exam today Continue Flonase, add in Claritin Tessalon for cough Use inhaler as needed Rest and fluids Follow-up if not seeing any improvement over the next 3 days

## 2020-08-09 NOTE — ED Triage Notes (Signed)
Pt here for nasal congestion and HA with some facial pain x 3 days; pt had negative covid test; pt sts some cough but mostly URI sx

## 2020-08-10 NOTE — ED Provider Notes (Signed)
[facility]    CSN: [id number] Arrival date & time: 08/09/20  1513      History   Chief Complaint Chief Complaint  Patient presents with  . Nasal Congestion    HPI [name] is a 75 y.o. female presenting today for evaluation of URI symptoms.  Reports that over the past 4 to 5 days days has had headache, nasal congestion and facial pain.  Reports prior Covid test negative.  Cough is mild, main concern has been sinus pressure.  She has also had a lot of fatigue and low energy.  Denies any known fevers.  Denies chest pain or shortness of breath.  HPI  Past Medical History:  Diagnosis Date  . Allergy   . Anxiety   . Arthritis    RA (Dr. Ouida Sills) Bilateral hands  . Blackwell. difficile diarrhea    history of  . CAD (coronary artery disease), native coronary artery    a. Cardiac CT with + FFR for Ramus b. cath 05/2017 100% sub acute lesion to mid circumflex artery s/p DES.  . Cancer ([place])    Right kidney CA removed.   . CKD (chronic kidney disease) stage 3, GFR 30-59 ml/min ([place]) 10/11/2016   s/p R nephrectomy  . Colon polyps 2008   HYPERPLASTIC  . Costochondritis    a. Nuc Stress Test 6/16: EF 70, no scar or ischemia, low risk // b. Echo 3/16: mild conc LVH, EF 65-70, no RWMA, Gr 1 DD, mild TR  . Diabetes mellitus without complication ([place])    type 2  no meds  . Dyspnea   . Gastritis   . GERD (gastroesophageal reflux disease)   . History of echocardiogram    Echo 3/18:  Moderate LVH, EF 44-03, grade 1 diastolic dysfunction, calcified aortic valve, mild MR, moderate LAE  . History of nuclear stress test    Myoview 3/18: Mod size and intensity fixed septal defect, may be artifact.Opposite mod size and intensity lat defect, which is reversible and could represent ischemia or possibly artifact (SDS 4). LVEF 71% with normal wall motion. Intermediate risk study. >> images reviewed with Dr. Dorris Carnes - no sig ischemia; med rx   . History of pneumonia   . Hx of  cardiovascular stress test    Lexiscan Myoview 6/16:  EF 70%, no scar or ischemia; Low Risk  . Hyperlipidemia   . Hypertension   . Neuromuscular disorder ([place])    Neruopathy in bilateral feet  . Neuropathy   . Orthostatic hypotension 05/28/2017  . Osteoarthritis   . Pneumonia   . S/P angioplasty with stent 05/27/17 to LCX with DES  05/28/2017  . Thyroid disease     Patient Active Problem List   Diagnosis Date Noted  . Malignant neoplasm of kidney excluding renal pelvis, unspecified laterality ([place]) 09/26/2019  . Vitamin D deficiency, unspecified 09/26/2019  . Anxiety disorder, unspecified 03/26/2019  . Tobacco use disorder 09/29/2018  . Cervical myelopathy ([place]) 05/29/2018  . ANA positive 12/08/2017  . Cervicalgia 09/16/2017  . Carpal tunnel syndrome of left wrist 07/21/2017  . Chondrocalcinosis 07/21/2017  . Neuropathy 07/21/2017  . Orthostatic hypotension 05/28/2017  . S/P angioplasty with stent 05/27/17 to LCX with DES  05/28/2017  . CAD (coronary artery disease)   . Polyneuropathy 05/25/2017  . Chest pain with moderate risk for cardiac etiology 05/25/2017  . Class 1 obesity with serious comorbidity and body mass index (BMI) of 34.0 to 34.9 in adult 12/06/2016  . CKD (  chronic kidney disease) stage 3, GFR 30-59 ml/min (H[facility]) 10/11/2016  . Hot flashes, menopausal 10/01/2016  . Chest pain with moderate risk of acute coronary syndrome 10/25/2014  . Degenerative spondylolisthesis 10/15/2014  . Spinal stenosis, lumbar region, with neurogenic claudication 02/12/2013  . DM (diabetes mellitus), type 2 with renal complications (H[facility]) 03/05/2010  . History of Rtr nephrectomy 2010, secondary to renal cell cancer 01/14/2009  . PERSONAL HX COLONIC POLYPS 11/14/2008  . Hyperlipidemia associated with type 2 diabetes mellitus ([facility]) 11/16/2007  . ESOPHAGEAL STRICTURE 10/12/2007  . Rheumatoid arthritis ([facility]) 02/27/2007  . Hypertension with heart disease 01/19/2007  . Allergic rhinitis  01/19/2007  . GERD 01/19/2007    Past Surgical History:  Procedure Laterality Date  . ABDOMINAL HYSTERECTOMY  1978  . ANTERIOR CERVICAL DECOMP/DISCECTOMY FUSION N/A 05/29/2018   Procedure: ANTERIOR CERVICAL DECOMPRESSION FUSION - CERVICAL FIVE-CERVICAL SIX - CERVICAL SIX-CERVICAL SEVEN;  Surgeon: Julio Sicks, MD;  Location: [facility] OR;  Service: Neurosurgery;  Laterality: N/A;  . BACK SURGERY     x 2  . CARPAL TUNNEL RELEASE Left   . COLONOSCOPY W/ BIOPSIES AND POLYPECTOMY     Hx: of  . CORONARY STENT INTERVENTION N/A 05/27/2017   Procedure: CORONARY STENT INTERVENTION;  Surgeon: Kathleene Hazel, MD;  Location: [facility] INVASIVE CV LAB;  Service: Cardiovascular;  Laterality: N/A;  . ESOPHAGOGASTRODUODENOSCOPY    . HNP    . LEFT HEART CATH AND CORONARY ANGIOGRAPHY N/A 05/27/2017   Procedure: LEFT HEART CATH AND CORONARY ANGIOGRAPHY;  Surgeon: Kathleene Hazel, MD;  Location: [facility] INVASIVE CV LAB;  Service: Cardiovascular;  Laterality: N/A;  . LUMBAR LAMINECTOMY/DECOMPRESSION MICRODISCECTOMY Left 02/12/2013   Procedure: LUMBAR TWO THREE, LUMBAR THREE FOUR, LUMBAR FOUR FIVE  LAMINECTOMY/DECOMPRESSION MICRODISCECTOMY 3 LEVELS;  Surgeon: Temple Pacini, MD;  Location: [facility] NEURO ORS;  Service: Neurosurgery;  Laterality: Left;  . NEPHRECTOMY Right 2010   10.rcc cancer  . TOTAL KNEE ARTHROPLASTY Right    Redo    OB History   No obstetric history on file.      Home Medications    Prior to Admission medications   Medication Sig Start Date End Date Taking? Authorizing Provider  acetaminophen (TYLENOL) 500 MG tablet Take 1,000 mg by mouth as needed for moderate pain or headache.     [provider]  albuterol (VENTOLIN HFA) 108 (90 Base) MCG/ACT inhaler INHALE ONE OR TWO PUFFS BY MOUTH INTO THE LUNGS EVERY SIX HOURS AS NEEDED FOR WHEEZING OR SHORTNESS OF BREATH 08/08/20   [place], [name], MD  Alirocumab (PRALUENT) 75 MG/ML SOAJ Inject 75 mg into the skin every 14 (fourteen) days.  06/10/20   Pricilla Riffle, MD  aspirin 81 MG chewable tablet Chew 81 mg by mouth daily.    [provider]  benzonatate (TESSALON) 200 MG capsule Take 1 capsule (200 mg total) by mouth 3 (three) times daily as needed for up to 7 days for cough. 08/09/20 08/16/20  Kortland Nichols Blackwell, [name]  colchicine 0.6 MG tablet Take 0.5 tablets (0.3 mg total) by mouth daily. 06/19/19   Pollyann Savoy, MD  diclofenac sodium (VOLTAREN) 1 % GEL Apply 2 g topically as needed (for arm pain).  Patient not taking: Reported on 04/09/2020    [provider]  DULoxetine (CYMBALTA) 30 MG capsule Take 1 capsule (30 mg total) by mouth daily. 07/07/20   [place], [name], MD  ezetimibe (ZETIA) 10 MG tablet Take 1 tablet (10 mg total) by mouth daily. 04/09/20  Fay Records, MD  fluticasone (FLONASE) 50 MCG/ACT nasal spray PLACE 2 SPRAYS INTO BOTH NOSTRILS AT BEDTIME AS NEEDED FOR ALLERGIES. 06/16/20   [name], [name], MD  gabapentin (NEURONTIN) 100 MG capsule Take 1 capsule (100 mg total) by mouth 3 (three) times daily. 03/03/20   Ward Givens, NP  gabapentin (NEURONTIN) 400 MG capsule Take 1 capsule (400 mg total) by mouth 2 (two) times daily. 03/03/20   Ward Givens, NP  losartan (COZAAR) 100 MG tablet TAKE ONE TABLET BY MOUTH ONE TIME DAILY 02/19/20   [name], [name], MD  metoprolol tartrate (LOPRESSOR) 50 MG tablet TAKE ONE TABLET BY MOUTH TWICE DAILY 02/27/20   [name], [name], MD  MYRBETRIQ 25 MG TB24 tablet Take 25 mg by mouth daily. 04/22/20   [provider]  nitroGLYCERIN (NITROSTAT) 0.4 MG SL tablet Place 1 tablet (0.4 mg total) under the tongue every 5 (five) minutes as needed for chest pain. 07/09/19   Fay Records, MD  omeprazole (PRILOSEC) 20 MG capsule Take 1 capsule (20 mg total) by mouth 2 (two) times daily before a meal. 12/04/19   [place], Lavone Nian, PA  polyethylene glycol powder (GLYCOLAX/MIRALAX) powder Take 17 g by mouth daily. Patient taking differently: Take 17 g by mouth every other  day.  05/03/17   Levin Erp, PA  rosuvastatin (CRESTOR) 20 MG tablet Take 1 tablet (20 mg total) by mouth daily. 06/10/20   Fay Records, MD  triamterene-hydrochlorothiazide [facility]) 37.5-25 MG tablet TAKE HALF TABLET BY MOUTH DAILY 07/01/20   Fay Records, MD    Family History Family History  Problem Relation Age of Onset  . Rheum arthritis Mother   . Stroke Mother   . Prostate cancer Father   . Heart disease Father   . Diabetes Brother   . Dementia Brother   . Parkinsonism Brother   . Kidney disease Son   . Diabetes Brother   . Cancer Brother   . Dementia Brother   . Colon cancer Neg Hx   . Esophageal cancer Neg Hx   . Pancreatic cancer Neg Hx   . Liver disease Neg Hx   . Rectal cancer Neg Hx   . Stomach cancer Neg Hx     Social History Social History   Tobacco Use  . Smoking status: Current Every Day Smoker    Packs/day: 0.20    Years: 15.00    Pack years: 3.00    Types: Cigarettes  . Smokeless tobacco: Never Used  . Tobacco comment: 03/16/17  1 pk/week  Vaping Use  . Vaping Use: Never used  Substance Use Topics  . Alcohol use: No    Alcohol/week: 0.0 standard drinks  . Drug use: No     Allergies   Oxycodone-acetaminophen, Percocet [oxycodone-acetaminophen], Pravastatin sodium, Hydrocodone, and Aspirin   Review of Systems Review of Systems  Constitutional: Positive for fatigue. Negative for activity change, appetite change, chills and fever.  HENT: Positive for congestion, rhinorrhea and sinus pressure. Negative for ear pain, sore throat and trouble swallowing.   Eyes: Negative for discharge and redness.  Respiratory: Positive for cough. Negative for chest tightness and shortness of breath.   Cardiovascular: Negative for chest pain.  Gastrointestinal: Negative for abdominal pain, diarrhea, nausea and vomiting.  Musculoskeletal: Negative for myalgias.  Skin: Negative for rash.  Neurological: Negative for dizziness, light-headedness and  headaches.     Physical Exam Triage Vital Signs ED Triage Vitals [08/09/20 1646]  Enc Vitals Group  BP 139/84     Pulse Rate 72     Resp 18     Temp 97.9 F (36.6 Blackwell)     Temp Source Oral     SpO2 97 %     Weight      Height      Head Circumference      Peak Flow      Pain Score 5     Pain Loc      Pain Edu?      Excl. in [place]?    No data found.  Updated Vital Signs BP 139/84 (BP Location: Right Arm)   Pulse 72   Temp 97.9 F (36.6 Blackwell) (Oral)   Resp 18   SpO2 97%   Visual Acuity Right Eye Distance:   Left Eye Distance:   Bilateral Distance:    Right Eye Near:   Left Eye Near:    Bilateral Near:     Physical Exam Vitals and nursing note reviewed.  Constitutional:      Appearance: She is well-developed and well-nourished.     Comments: No acute distress  HENT:     Head: Normocephalic and atraumatic.     Ears:     Comments: Bilateral ears without tenderness to palpation of external auricle, tragus and mastoid, EAC's without erythema or swelling, TM's with good bony landmarks and cone of light. Non erythematous.     Nose: Nose normal.     Mouth/Throat:     Comments: Oral mucosa pink and moist, no tonsillar enlargement or exudate. Posterior pharynx patent and nonerythematous, no uvula deviation or swelling. Normal phonation. Eyes:     Conjunctiva/sclera: Conjunctivae normal.  Cardiovascular:     Rate and Rhythm: Normal rate.  Pulmonary:     Effort: Pulmonary effort is normal. No respiratory distress.     Comments: Breathing comfortably at rest, CTABL, no wheezing, rales or other adventitious sounds auscultated Abdominal:     General: There is no distension.  Musculoskeletal:        General: Normal range of motion.     Cervical back: Neck supple.  Skin:    General: Skin is warm and dry.  Neurological:     Mental Status: She is alert and oriented to person, place, and time.  Psychiatric:        Mood and Affect: Mood and affect normal.      UC  Treatments / Results  Labs (all labs ordered are listed, but only abnormal results are displayed) Labs Reviewed - No data to display  EKG   Radiology No results found.  Procedures Procedures (including critical care time)  Medications Ordered in UC Medications - No data to display  Initial Impression / Assessment and Plan / UC Course  I have reviewed the triage vital signs and the nursing notes.  Pertinent labs & imaging results that were available during my care of the patient were reviewed by me and considered in my medical decision making (see chart for details).     Viral URI with cough-lungs clear to auscultation, exam reassuring, vital signs stable, recommending continued symptomatic and supportive care symptoms with close monitoring.  Discussed strict return precautions. Patient verbalized understanding and is agreeable with plan.  Final Clinical Impressions(s) / UC Diagnoses   Final diagnoses:  Viral URI with cough     Discharge Instructions     Lungs clear on exam today Continue Flonase, add in Claritin Tessalon for cough Use inhaler as needed Rest and fluids  Follow-up if not seeing any improvement over the next 3 days    ED Prescriptions    Medication Sig Dispense Auth. Provider   benzonatate (TESSALON) 200 MG capsule  (Status: Discontinued) Take 1 capsule (200 mg total) by mouth 3 (three) times daily as needed for up to 7 days for cough. 28 capsule Rose Fuerte Blackwell, [name]   benzonatate (TESSALON) 200 MG capsule Take 1 capsule (200 mg total) by mouth 3 (three) times daily as needed for up to 7 days for cough. 28 capsule [name], [name], [name]     PDMP not reviewed this encounter.   Janith L[place], [place] 08/10/20 1118

## 2020-08-26 ENCOUNTER — Ambulatory Visit (INDEPENDENT_AMBULATORY_CARE_PROVIDER_SITE_OTHER): Payer: Medicare HMO

## 2020-08-26 DIAGNOSIS — Z0000 Encounter for general adult medical examination without abnormal findings: Secondary | ICD-10-CM | POA: Diagnosis not present

## 2020-08-26 NOTE — Progress Notes (Signed)
Subjective:   [name] is a 75 y.o. female who presents for an Initial Medicare Annual Wellness Visit.  I connected with [name]  today by telephone and verified that I am speaking with the correct person using two identifiers. Location patient: home Location provider: work Persons participating in the virtual visit: patient, provider.   I discussed the limitations, risks, security and privacy concerns of performing an evaluation and management service by telephone and the availability of in person appointments. I also discussed with the patient that there may be a patient responsible charge related to this service. The patient expressed understanding and verbally consented to this telephonic visit.    Interactive audio and video telecommunications were attempted between this provider and patient, however failed, due to patient having technical difficulties OR patient did not have access to video capability.  We continued and completed visit with audio only.       Review of Systems    N/A  Cardiac Risk Factors include: advanced age (>16men, >36 women);hypertension;dyslipidemia;obesity (BMI >30kg/m2)     Objective:    Today's Vitals   08/26/20 1031  PainSc: 6    There is no height or weight on file to calculate BMI.  Advanced Directives 08/26/2020 10/16/2019 10/07/2018 05/29/2018 05/01/2018 10/04/2017 05/25/2017  Does Patient Have a Medical Advance Directive? Yes Yes Yes Yes Yes Yes No  Type of Paramedic of [place];Living will Living will;Healthcare Power of Attorney Living will Living will Living will [place];Living will -  Does patient want to make changes to medical advance directive? No - Patient declined No - Patient declined No - Patient declined No - Patient declined No - Patient declined - -  Copy of [place] in Chart? No - copy requested - - - - Yes -  Would patient like information on creating a medical  advance directive? - - - - - - No - Patient declined    Current Medications (verified) Outpatient Encounter Medications as of 08/26/2020  Medication Sig  . acetaminophen (TYLENOL) 500 MG tablet Take 1,000 mg by mouth as needed for moderate pain or headache.   . albuterol (VENTOLIN HFA) 108 (90 Base) MCG/ACT inhaler INHALE ONE OR TWO PUFFS BY MOUTH INTO THE LUNGS EVERY SIX HOURS AS NEEDED FOR WHEEZING OR SHORTNESS OF BREATH  . Alirocumab (PRALUENT) 75 MG/ML SOAJ Inject 75 mg into the skin every 14 (fourteen) days.  [name] Kitchen aspirin 81 MG chewable tablet Chew 81 mg by mouth daily.  . clopidogrel (PLAVIX) 75 MG tablet Take 75 mg by mouth daily.  . DULoxetine (CYMBALTA) 30 MG capsule Take 1 capsule (30 mg total) by mouth daily.  [name] Kitchen ezetimibe (ZETIA) 10 MG tablet Take 1 tablet (10 mg total) by mouth daily.  . fluticasone (FLONASE) 50 MCG/ACT nasal spray PLACE 2 SPRAYS INTO BOTH NOSTRILS AT BEDTIME AS NEEDED FOR ALLERGIES.  [name] Kitchen gabapentin (NEURONTIN) 100 MG capsule Take 1 capsule (100 mg total) by mouth 3 (three) times daily.  [name] Kitchen gabapentin (NEURONTIN) 400 MG capsule Take 1 capsule (400 mg total) by mouth 2 (two) times daily.  [name] Kitchen losartan (COZAAR) 100 MG tablet TAKE ONE TABLET BY MOUTH ONE TIME DAILY  . metoprolol tartrate (LOPRESSOR) 50 MG tablet TAKE ONE TABLET BY MOUTH TWICE DAILY  . MYRBETRIQ 25 MG TB24 tablet Take 25 mg by mouth daily.  [name] Kitchen omeprazole (PRILOSEC) 20 MG capsule Take 1 capsule (20 mg total) by mouth 2 (two) times daily before a  meal.  . polyethylene glycol powder (GLYCOLAX/MIRALAX) powder Take 17 g by mouth daily. (Patient taking differently: Take 17 g by mouth every other day.)  . rosuvastatin (CRESTOR) 20 MG tablet Take 1 tablet (20 mg total) by mouth daily.  [name] Kitchen triamterene-hydrochlorothiazide (MAXZIDE-25) 37.5-25 MG tablet TAKE HALF TABLET BY MOUTH DAILY  . colchicine 0.6 MG tablet Take 0.5 tablets (0.3 mg total) by mouth daily. (Patient not taking: Reported on 08/26/2020)  . diclofenac sodium  (VOLTAREN) 1 % GEL Apply 2 g topically as needed (for arm pain).  (Patient not taking: No sig reported)  . nitroGLYCERIN (NITROSTAT) 0.4 MG SL tablet Place 1 tablet (0.4 mg total) under the tongue every 5 (five) minutes as needed for chest pain. (Patient not taking: Reported on 08/26/2020)  . [DISCONTINUED] amoxicillin (AMOXIL) 875 MG tablet Take 875 mg by mouth 2 (two) times daily.   No facility-administered encounter medications on file as of 08/26/2020.    Allergies (verified) Oxycodone-acetaminophen, Percocet [oxycodone-acetaminophen], Pravastatin sodium, Hydrocodone, and Aspirin   History: Past Medical History:  Diagnosis Date  . Allergy   . Anxiety   . Arthritis    RA (Dr. Ouida Sills) Bilateral hands  . C. difficile diarrhea    history of  . CAD (coronary artery disease), native coronary artery    a. Cardiac CT with + FFR for Ramus b. cath 05/2017 100% sub acute lesion to mid circumflex artery s/p DES.  . Cancer ([place])    Right kidney CA removed.   . CKD (chronic kidney disease) stage 3, GFR 30-59 ml/min ([place]) 10/11/2016   s/p R nephrectomy  . Colon polyps 2008   HYPERPLASTIC  . Costochondritis    a. Nuc Stress Test 6/16: EF 70, no scar or ischemia, low risk // b. Echo 3/16: mild conc LVH, EF 65-70, no RWMA, Gr 1 DD, mild TR  . Diabetes mellitus without complication ([place])    type 2  no meds  . Dyspnea   . Gastritis   . GERD (gastroesophageal reflux disease)   . History of echocardiogram    Echo 3/18:  Moderate LVH, EF 76-19, grade 1 diastolic dysfunction, calcified aortic valve, mild MR, moderate LAE  . History of nuclear stress test    Myoview 3/18: Mod size and intensity fixed septal defect, may be artifact.Opposite mod size and intensity lat defect, which is reversible and could represent ischemia or possibly artifact (SDS 4). LVEF 71% with normal wall motion. Intermediate risk study. >> images reviewed with Dr. Dorris Carnes - no sig ischemia; med rx   . History of pneumonia    . Hx of cardiovascular stress test    Lexiscan Myoview 6/16:  EF 70%, no scar or ischemia; Low Risk  . Hyperlipidemia   . Hypertension   . Neuromuscular disorder ([place])    Neruopathy in bilateral feet  . Neuropathy   . Orthostatic hypotension 05/28/2017  . Osteoarthritis   . Pneumonia   . S/P angioplasty with stent 05/27/17 to LCX with DES  05/28/2017  . Thyroid disease    Past Surgical History:  Procedure Laterality Date  . ABDOMINAL HYSTERECTOMY  1978  . ANTERIOR CERVICAL DECOMP/DISCECTOMY FUSION N/A 05/29/2018   Procedure: ANTERIOR CERVICAL DECOMPRESSION FUSION - CERVICAL FIVE-CERVICAL SIX - CERVICAL SIX-CERVICAL SEVEN;  Surgeon: Earnie Larsson, MD;  Location: [facility];  Service: Neurosurgery;  Laterality: N/A;  . BACK SURGERY     x 2  . CARPAL TUNNEL RELEASE Left   . COLONOSCOPY W/ BIOPSIES AND POLYPECTOMY  Hx: of  . CORONARY STENT INTERVENTION N/A 05/27/2017   Procedure: CORONARY STENT INTERVENTION;  Surgeon: Burnell Blanks, MD;  Location: [place] [facility];  Service: Cardiovascular;  Laterality: N/A;  . ESOPHAGOGASTRODUODENOSCOPY    . HNP    . LEFT HEART CATH AND CORONARY ANGIOGRAPHY N/A 05/27/2017   Procedure: LEFT HEART CATH AND CORONARY ANGIOGRAPHY;  Surgeon: Burnell Blanks, MD;  Location: [facility];  Service: Cardiovascular;  Laterality: N/A;  . LUMBAR LAMINECTOMY/DECOMPRESSION MICRODISCECTOMY Left 02/12/2013   Procedure: LUMBAR TWO THREE, LUMBAR THREE FOUR, LUMBAR FOUR FIVE  LAMINECTOMY/DECOMPRESSION MICRODISCECTOMY 3 LEVELS;  Surgeon: Charlie Pitter, MD;  Location: [facility];  Service: Neurosurgery;  Laterality: Left;  . NEPHRECTOMY Right 2010   10.rcc cancer  . TOTAL KNEE ARTHROPLASTY Right    Redo   Family History  Problem Relation Age of Onset  . Rheum arthritis Mother   . Stroke Mother   . Prostate cancer Father   . Heart disease Father   . Diabetes Brother   . Dementia Brother   . Parkinsonism Brother   . Kidney disease Son   .  Diabetes Brother   . Cancer Brother   . Dementia Brother   . Colon cancer Neg Hx   . Esophageal cancer Neg Hx   . Pancreatic cancer Neg Hx   . Liver disease Neg Hx   . Rectal cancer Neg Hx   . Stomach cancer Neg Hx    Social History   Socioeconomic History  . Marital status: Divorced    Spouse name: Not on file  . Number of children: 3  . Years of education: 41  . Highest education level: Not on file  Occupational History  . Occupation: Retired    Fish farm manager: RETIRED  Tobacco Use  . Smoking status: Current Every Day Smoker    Packs/day: 0.20    Years: 15.00    Pack years: 3.00    Types: Cigarettes  . Smokeless tobacco: Never Used  . Tobacco comment: 03/16/17  1 pk/week  Vaping Use  . Vaping Use: Never used  Substance and Sexual Activity  . Alcohol use: No    Alcohol/week: 0.0 standard drinks  . Drug use: No  . Sexual activity: Not on file  Other Topics Concern  . Not on file  Social History Narrative   Single, dgtr lives with her   Never Smoked    Alcohol use- no   Drug use-no   Regular Exercise-yes   Former Smoker- 12/2008   Social Determinants of Health   Financial Resource Strain: Low Risk   . Difficulty of Paying Living Expenses: Not hard at all  Food Insecurity: No Food Insecurity  . Worried About Charity fundraiser in the Last Year: Never true  . Ran Out of Food in the Last Year: Never true  Transportation Needs: No Transportation Needs  . Lack of Transportation (Medical): No  . Lack of Transportation (Non-Medical): No  Physical Activity: Inactive  . Days of Exercise per Week: 0 days  . Minutes of Exercise per Session: 0 min  Stress: No Stress Concern Present  . Feeling of Stress : Not at all  Social Connections: Moderately Isolated  . Frequency of Communication with Friends and Family: More than three times a week  . Frequency of Social Gatherings with Friends and Family: More than three times a week  . Attends Religious Services: More than 4 times  per year  . Active Member of Clubs  or Organizations: No  . Attends Archivist Meetings: Never  . Marital Status: Divorced    Tobacco Counseling Ready to quit: Not Answered Counseling given: Not Answered Comment: 03/16/17  1 pk/week   Clinical Intake:  Pre-visit preparation completed: Yes  Pain : 0-10 Pain Score: 6  Pain Type: Chronic pain Pain Location: Hand (lower back) Pain Orientation: Right,Left,Lower Pain Descriptors / Indicators: Aching,Tingling Pain Onset: More than a month ago Pain Frequency: Intermittent Pain Relieving Factors: Gabapentin  Pain Relieving Factors: Gabapentin  Nutritional Risks: None Diabetes: Yes CBG done?: No Did pt. bring in CBG monitor from home?: No  How often do you need to have someone help you when you read instructions, pamphlets, or other written materials from your doctor or pharmacy?: 2 - Rarely What is the last grade level you completed in school?: 12th grade  Diabetic?Yes Nutrition Risk Assessment:  Has the patient had any N/V/D within the last 2 months?  No  Does the patient have any non-healing wounds?  No  Has the patient had any unintentional weight loss or weight gain?  No   Diabetes:  Is the patient diabetic?  Yes  If diabetic, was a CBG obtained today?  No  Did the patient bring in their glucometer from home?  No  How often do you monitor your CBG's? Patient states checks glucose once per week .   Financial Strains and Diabetes Management:  Are you having any financial strains with the device, your supplies or your medication? No .  Does the patient want to be seen by Chronic Care Management for management of their diabetes?  No  Would the patient like to be referred to a Nutritionist or for Diabetic Management?  No   Diabetic Exams:  Diabetic Eye Exam: Completed 03/22/2020 Diabetic Foot Exam: Completed 09/26/2019   Interpreter Needed?: No  Information entered by :: [place] of  Daily Living In your present state of health, do you have any difficulty performing the following activities: 08/26/2020 03/26/2020  Hearing? N N  Vision? N N  Difficulty concentrating or making decisions? N N  Walking or climbing stairs? N N  Dressing or bathing? N N  Doing errands, shopping? N N  Preparing Food and eating ? N -  Using the Toilet? N -  In the past six months, have you accidently leaked urine? Y -  Do you have problems with loss of bowel control? N -  Managing your Medications? N -  Managing your Finances? N -  Housekeeping or managing your Housekeeping? Y -  Comment needs assistance with mopping the floors and etc -  Some recent data might be hidden    Patient Care Team: [name], [name], MD as PCP - General (Family Medicine) Fay Records, MD as PCP - Cardiology (Cardiology)  Indicate any recent Medical Services you may have received from other than [facility] providers in the past year (date may be approximate).     Assessment:   This is a routine wellness examination for [facility].  Hearing/Vision screen  Hearing Screening   125Hz  250Hz  500Hz  1000Hz  2000Hz  3000Hz  4000Hz  6000Hz  8000Hz   Right ear:           Left ear:           Vision Screening Comments: Patient states gets eyes examined once per year    Dietary issues and exercise activities discussed: Current Exercise Habits: The patient does not participate in regular exercise at present, Exercise limited by:  orthopedic condition(s);neurologic condition(s)  Goals    . Exercise 3x per week (30 min per time)    . Weight (lb) < 200 lb (90.7 kg)      Depression Screen PHQ 2/9 Scores 08/26/2020 03/26/2019 06/08/2016 12/04/2014 11/22/2013  PHQ - 2 Score 0 1 0 0 0    Fall Risk Fall Risk  08/26/2020 03/26/2020 03/26/2019 11/14/2017 06/08/2016  Falls in the past year? 1 0 1 No No  Number falls in past yr: 0 - 1 - -  Injury with Fall? 0 - 0 - -  Risk for fall due to : History of fall(s) - - - -  Risk for fall due to: Comment  - - - - -  Follow up Falls evaluation completed;Falls prevention discussed - - - -    FALL RISK PREVENTION PERTAINING TO THE HOME:  Any stairs in or around the home? No  If so, are there any without handrails? No  Home free of loose throw rugs in walkways, pet beds, electrical cords, etc? Yes  Adequate lighting in your home to reduce risk of falls? Yes   ASSISTIVE DEVICES UTILIZED TO PREVENT FALLS:  Life alert? No  Use of a cane, walker or w/c? No  Grab bars in the bathroom? Yes  Shower chair or bench in shower? Yes  Elevated toilet seat or a handicapped toilet? Yes     Cognitive Function:   Normal cognitive status assessed by direct observation by this Nurse Health Advisor. No abnormalities found.        Immunizations Immunization History  Administered Date(s) Administered  . Fluad Quad(high Dose 65+) 05/05/2019, 06/09/2020  . Influenza Split 05/23/2012, 06/07/2013  . Influenza Whole 08/02/2005, 05/08/2007, 05/09/2008, 08/19/2009, 06/03/2010  . Influenza, High Dose Seasonal PF 06/20/2014, 05/13/2015, 06/08/2016, 05/23/2017, 05/18/2018  . Pneumococcal Conjugate-13 02/27/2007  . Pneumococcal Polysaccharide-23 02/27/2007, 12/16/2011, 10/16/2014, 05/26/2017  . Td 08/02/2001  . Tdap 12/16/2011  . Zoster 12/27/2012    TDAP status: Up to date  Flu Vaccine status: Up to date  Pneumococcal vaccine status: Up to date  Covid-19 vaccine status: Completed vaccines  Qualifies for Shingles Vaccine? Yes   Zostavax completed Yes   Shingrix Completed?: No.    Education has been provided regarding the importance of this vaccine. Patient has been advised to call insurance company to determine out of pocket expense if they have not yet received this vaccine. Advised may also receive vaccine at local pharmacy or Health Dept. Verbalized acceptance and understanding.  Screening Tests Health Maintenance  Topic Date Due  . COVID-19 Vaccine (1) Never done  . MAMMOGRAM  06/13/2018  .  FOOT EXAM  09/25/2020  . HEMOGLOBIN A1C  09/26/2020  . OPHTHALMOLOGY EXAM  03/22/2021  . TETANUS/TDAP  12/15/2021  . COLONOSCOPY (Pts 45-24yrs Insurance coverage will need to be confirmed)  02/05/2023  . INFLUENZA VACCINE  Completed  . DEXA SCAN  Completed  . Hepatitis C Screening  Completed  . PNA vac Low Risk Adult  Completed    Health Maintenance  Health Maintenance Due  Topic Date Due  . COVID-19 Vaccine (1) Never done  . MAMMOGRAM  06/13/2018    Colorectal cancer screening: Type of screening: Colonoscopy. Completed 02/05/2020. Repeat every 3 years  Mammogram status: Completed 2021. Repeat every year  Bone Density status: Ordered 08/26/2020. Pt provided with contact info and advised to call to schedule appt.  Lung Cancer Screening: (Low Dose CT Chest recommended if Age 39-80 years, 30 pack-year currently smoking OR have  quit w/in 15years.) does not qualify.   Lung Cancer Screening Referral: N/A   Additional Screening:  Hepatitis C Screening: does qualify; Completed 08/16/2018   Vision Screening: Recommended annual ophthalmology exams for early detection of glaucoma and other disorders of the eye. Is the patient up to date with their annual eye exam?  Yes  Who is the provider or what is the name of the office in which the patient attends annual eye exams? [facility]  If pt is not established with a provider, would they like to be referred to a provider to establish care? No .   Dental Screening: Recommended annual dental exams for proper oral hygiene  Community Resource Referral / Chronic Care Management: CRR required this visit?  No   CCM required this visit?  No      Plan:     I have personally reviewed and noted the following in the patient's chart:   . Medical and social history . Use of alcohol, tobacco or illicit drugs  . Current medications and supplements . Functional ability and status . Nutritional status . Physical  activity . Advanced directives . List of other physicians . Hospitalizations, surgeries, and ER visits in previous 12 months . Vitals . Screenings to include cognitive, depression, and falls . Referrals and appointments  In addition, I have reviewed and discussed with patient certain preventive protocols, quality metrics, and best practice recommendations. A written personalized care plan for preventive services as well as general preventive health recommendations were provided to patient.     Ofilia Neas, LPN   [id number]   Nurse Notes: None

## 2020-08-26 NOTE — Patient Instructions (Signed)
[name] , Thank you for taking time to come for your Medicare Wellness Visit. I appreciate your ongoing commitment to your health goals. Please review the following plan we discussed and let me know if I can assist you in the future.   Screening recommendations/referrals: Colonoscopy: Up to date, next due 02/05/2023 Mammogram: Up to date, next due 2022. Please have your results of your last mammogram sent to our office  Bone Density: Orders placed for Bone Density this visit  Recommended yearly ophthalmology/optometry visit for glaucoma screening and checkup Recommended yearly dental visit for hygiene and checkup  Vaccinations: Influenza vaccine: Up to date, next due  Pneumococcal vaccine: Completed series  Tdap vaccine: Up to date, next due 12/15/2021 Shingles vaccine: Currently due for Shingrix, if you would like to receive we recommend that you do so at your local pharmacy as it is less expensive     Advanced directives: Please bring in copies of your advanced medical directives so that we may scan them into your chart.    Conditions/risks identified: None   Next appointment: 09/03/2020 @ 12:00 PM with Dr. [name]    Preventive Care 77 Years and Older, Female Preventive care refers to lifestyle choices and visits with your health care provider that can promote health and wellness. What does preventive care include?  A yearly physical exam. This is also called an annual well check.  Dental exams once or twice a year.  Routine eye exams. Ask your health care provider how often you should have your eyes checked.  Personal lifestyle choices, including:  Daily care of your teeth and gums.  Regular physical activity.  Eating a healthy diet.  Avoiding tobacco and drug use.  Limiting alcohol use.  Practicing safe sex.  Taking low-dose aspirin every day.  Taking vitamin and mineral supplements as recommended by your health care provider. What happens during an annual well  check? The services and screenings done by your health care provider during your annual well check will depend on your age, overall health, lifestyle risk factors, and family history of disease. Counseling  Your health care provider may ask you questions about your:  Alcohol use.  Tobacco use.  Drug use.  Emotional well-being.  Home and relationship well-being.  Sexual activity.  Eating habits.  History of falls.  Memory and ability to understand (cognition).  Work and work Statistician.  Reproductive health. Screening  You may have the following tests or measurements:  Height, weight, and BMI.  Blood pressure.  Lipid and cholesterol levels. These may be checked every 5 years, or more frequently if you are over 46 years old.  Skin check.  Lung cancer screening. You may have this screening every year starting at age 79 if you have a 30-pack-year history of smoking and currently smoke or have quit within the past 15 years.  Fecal occult blood test (FOBT) of the stool. You may have this test every year starting at age 44.  Flexible sigmoidoscopy or colonoscopy. You may have a sigmoidoscopy every 5 years or a colonoscopy every 10 years starting at age 33.  Hepatitis C blood test.  Hepatitis B blood test.  Sexually transmitted disease (STD) testing.  Diabetes screening. This is done by checking your blood sugar (glucose) after you have not eaten for a while (fasting). You may have this done every 1-3 years.  Bone density scan. This is done to screen for osteoporosis. You may have this done starting at age 27.  Mammogram. This may  be done every 1-2 years. Talk to your health care provider about how often you should have regular mammograms. Talk with your health care provider about your test results, treatment options, and if necessary, the need for more tests. Vaccines  Your health care provider may recommend certain vaccines, such as:  Influenza vaccine. This is  recommended every year.  Tetanus, diphtheria, and acellular pertussis (Tdap, Td) vaccine. You may need a Td booster every 10 years.  Zoster vaccine. You may need this after age 68.  Pneumococcal 13-valent conjugate (PCV13) vaccine. One dose is recommended after age 40.  Pneumococcal polysaccharide (PPSV23) vaccine. One dose is recommended after age 4. Talk to your health care provider about which screenings and vaccines you need and how often you need them. This information is not intended to replace advice given to you by your health care provider. Make sure you discuss any questions you have with your health care provider. Document Released: 08/15/2015 Document Revised: 04/07/2016 Document Reviewed: 05/20/2015 Elsevier Interactive Patient Education  2017 [employer] Prevention in the Home Falls can cause injuries. They can happen to people of all ages. There are many things you can do to make your home safe and to help prevent falls. What can I do on the outside of my home?  Regularly fix the edges of walkways and driveways and fix any cracks.  Remove anything that might make you trip as you walk through a door, such as a raised step or threshold.  Trim any bushes or trees on the path to your home.  Use bright outdoor lighting.  Clear any walking paths of anything that might make someone trip, such as rocks or tools.  Regularly check to see if handrails are loose or broken. Make sure that both sides of any steps have handrails.  Any raised decks and porches should have guardrails on the edges.  Have any leaves, snow, or ice cleared regularly.  Use sand or salt on walking paths during winter.  Clean up any spills in your garage right away. This includes oil or grease spills. What can I do in the bathroom?  Use night lights.  Install grab bars by the toilet and in the tub and shower. Do not use towel bars as grab bars.  Use non-skid mats or decals in the tub or  shower.  If you need to sit down in the shower, use a plastic, non-slip stool.  Keep the floor dry. Clean up any water that spills on the floor as soon as it happens.  Remove soap buildup in the tub or shower regularly.  Attach bath mats securely with double-sided non-slip rug tape.  Do not have throw rugs and other things on the floor that can make you trip. What can I do in the bedroom?  Use night lights.  Make sure that you have a light by your bed that is easy to reach.  Do not use any sheets or blankets that are too big for your bed. They should not hang down onto the floor.  Have a firm chair that has side arms. You can use this for support while you get dressed.  Do not have throw rugs and other things on the floor that can make you trip. What can I do in the kitchen?  Clean up any spills right away.  Avoid walking on wet floors.  Keep items that you use a lot in easy-to-reach places.  If you need to reach something above you,  use a strong step stool that has a grab bar.  Keep electrical cords out of the way.  Do not use floor polish or wax that makes floors slippery. If you must use wax, use non-skid floor wax.  Do not have throw rugs and other things on the floor that can make you trip. What can I do with my stairs?  Do not leave any items on the stairs.  Make sure that there are handrails on both sides of the stairs and use them. Fix handrails that are broken or loose. Make sure that handrails are as long as the stairways.  Check any carpeting to make sure that it is firmly attached to the stairs. Fix any carpet that is loose or worn.  Avoid having throw rugs at the top or bottom of the stairs. If you do have throw rugs, attach them to the floor with carpet tape.  Make sure that you have a light switch at the top of the stairs and the bottom of the stairs. If you do not have them, ask someone to add them for you. What else can I do to help prevent  falls?  Wear shoes that:  Do not have high heels.  Have rubber bottoms.  Are comfortable and fit you well.  Are closed at the toe. Do not wear sandals.  If you use a stepladder:  Make sure that it is fully opened. Do not climb a closed stepladder.  Make sure that both sides of the stepladder are locked into place.  Ask someone to hold it for you, if possible.  Clearly mark and make sure that you can see:  Any grab bars or handrails.  First and last steps.  Where the edge of each step is.  Use tools that help you move around (mobility aids) if they are needed. These include:  Canes.  Walkers.  Scooters.  Crutches.  Turn on the lights when you go into a dark area. Replace any light bulbs as soon as they burn out.  Set up your furniture so you have a clear path. Avoid moving your furniture around.  If any of your floors are uneven, fix them.  If there are any pets around you, be aware of where they are.  Review your medicines with your doctor. Some medicines can make you feel dizzy. This can increase your chance of falling. Ask your doctor what other things that you can do to help prevent falls. This information is not intended to replace advice given to you by your health care provider. Make sure you discuss any questions you have with your health care provider. Document Released: 05/15/2009 Document Revised: 12/25/2015 Document Reviewed: 08/23/2014 Elsevier Interactive Patient Education  2017 [employer].

## 2020-08-30 ENCOUNTER — Ambulatory Visit (HOSPITAL_COMMUNITY)
Admission: EM | Admit: 2020-08-30 | Discharge: 2020-08-30 | Disposition: A | Discharge status: Home / Self Care | Payer: Medicare HMO

## 2020-08-30 ENCOUNTER — Observation Stay (HOSPITAL_BASED_OUTPATIENT_CLINIC_OR_DEPARTMENT_OTHER)
Admission: EM | Admit: 2020-08-30 | Discharge: 2020-09-01 | Disposition: A | Discharge status: Home / Self Care | Payer: Medicare HMO | Attending: Family Medicine | Admitting: Family Medicine

## 2020-08-30 ENCOUNTER — Encounter (HOSPITAL_BASED_OUTPATIENT_CLINIC_OR_DEPARTMENT_OTHER): Payer: Self-pay | Admitting: *Deleted

## 2020-08-30 ENCOUNTER — Emergency Department (HOSPITAL_BASED_OUTPATIENT_CLINIC_OR_DEPARTMENT_OTHER): Payer: Medicare HMO

## 2020-08-30 ENCOUNTER — Other Ambulatory Visit: Payer: Self-pay

## 2020-08-30 DIAGNOSIS — R69 Illness, unspecified: Secondary | ICD-10-CM | POA: Diagnosis not present

## 2020-08-30 DIAGNOSIS — Z96651 Presence of right artificial knee joint: Secondary | ICD-10-CM | POA: Insufficient documentation

## 2020-08-30 DIAGNOSIS — R0789 Other chest pain: Secondary | ICD-10-CM | POA: Diagnosis not present

## 2020-08-30 DIAGNOSIS — R079 Chest pain, unspecified: Principal | ICD-10-CM | POA: Diagnosis present

## 2020-08-30 DIAGNOSIS — F17210 Nicotine dependence, cigarettes, uncomplicated: Secondary | ICD-10-CM | POA: Diagnosis not present

## 2020-08-30 DIAGNOSIS — I2510 Atherosclerotic heart disease of native coronary artery without angina pectoris: Secondary | ICD-10-CM | POA: Insufficient documentation

## 2020-08-30 DIAGNOSIS — N183 Chronic kidney disease, stage 3 unspecified: Secondary | ICD-10-CM | POA: Diagnosis not present

## 2020-08-30 DIAGNOSIS — I119 Hypertensive heart disease without heart failure: Secondary | ICD-10-CM | POA: Insufficient documentation

## 2020-08-30 DIAGNOSIS — Z7982 Long term (current) use of aspirin: Secondary | ICD-10-CM | POA: Diagnosis not present

## 2020-08-30 DIAGNOSIS — Z85528 Personal history of other malignant neoplasm of kidney: Secondary | ICD-10-CM | POA: Diagnosis not present

## 2020-08-30 DIAGNOSIS — I129 Hypertensive chronic kidney disease with stage 1 through stage 4 chronic kidney disease, or unspecified chronic kidney disease: Secondary | ICD-10-CM | POA: Insufficient documentation

## 2020-08-30 DIAGNOSIS — E1122 Type 2 diabetes mellitus with diabetic chronic kidney disease: Secondary | ICD-10-CM | POA: Insufficient documentation

## 2020-08-30 DIAGNOSIS — Z20822 Contact with and (suspected) exposure to covid-19: Secondary | ICD-10-CM | POA: Diagnosis not present

## 2020-08-30 DIAGNOSIS — Z79899 Other long term (current) drug therapy: Secondary | ICD-10-CM | POA: Diagnosis not present

## 2020-08-30 DIAGNOSIS — R0602 Shortness of breath: Secondary | ICD-10-CM | POA: Diagnosis not present

## 2020-08-30 HISTORY — DX: Rheumatoid arthritis, unspecified: M06.9

## 2020-08-30 LAB — BASIC METABOLIC PANEL
Anion gap: 10 (ref 5–15)
BUN: 19 mg/dL (ref 8–23)
CO2: 24 mmol/L (ref 22–32)
Calcium: 9.3 mg/dL (ref 8.9–10.3)
Chloride: 103 mmol/L (ref 98–111)
Creatinine, Ser: 1.51 mg/dL — ABNORMAL HIGH (ref 0.44–1.00)
GFR, Estimated: 36 mL/min — ABNORMAL LOW (ref >60)
Glucose, Bld: 108 mg/dL — ABNORMAL HIGH (ref 70–99)
Potassium: 3.7 mmol/L (ref 3.5–5.1)
Sodium: 137 mmol/L (ref 135–145)

## 2020-08-30 LAB — CBC WITH DIFFERENTIAL/PLATELET
Abs Immature Granulocytes: 0.02 10*3/uL (ref 0.00–0.07)
Basophils Absolute: 0.1 10*3/uL (ref 0.0–0.1)
Basophils Relative: 1 %
Eosinophils Absolute: 0.5 10*3/uL (ref 0.0–0.5)
Eosinophils Relative: 6 %
HCT: 42 % (ref 36.0–46.0)
Hemoglobin: 13.9 g/dL (ref 12.0–15.0)
Immature Granulocytes: 0 %
Lymphocytes Relative: 47 %
Lymphs Abs: 3.4 10*3/uL (ref 0.7–4.0)
MCH: 30.2 pg (ref 26.0–34.0)
MCHC: 33.1 g/dL (ref 30.0–36.0)
MCV: 91.3 fL (ref 80.0–100.0)
Monocytes Absolute: 0.7 10*3/uL (ref 0.1–1.0)
Monocytes Relative: 9 %
Neutro Abs: 2.8 10*3/uL (ref 1.7–7.7)
Neutrophils Relative %: 37 %
Platelets: 223 10*3/uL (ref 150–400)
RBC: 4.6 MIL/uL (ref 3.87–5.11)
RDW: 15.7 % — ABNORMAL HIGH (ref 11.5–15.5)
WBC: 7.5 10*3/uL (ref 4.0–10.5)
nRBC: 0 % (ref 0.0–0.2)

## 2020-08-30 LAB — TROPONIN I (HIGH SENSITIVITY)
Troponin I (High Sensitivity): 3 ng/L (ref <18)
Troponin I (High Sensitivity): 3 ng/L (ref <18)

## 2020-08-30 MED ORDER — ASPIRIN 81 MG PO CHEW
324.0000 mg | CHEWABLE_TABLET | Freq: Once | ORAL | Status: AC
  Administered 2020-08-30: 324 mg via ORAL
  Filled 2020-08-30: qty 4

## 2020-08-30 NOTE — ED Triage Notes (Signed)
Pt reports left side chest pain, intermittent since last night. Reports feeling sob and sweating during episodes. States episodes lasted 2-3 mins and resolved without meds. She has cardiac stents. Went to urgent care pta arrival and was referred to ED

## 2020-08-30 NOTE — ED Notes (Signed)
ED Provider at bedside. 

## 2020-08-30 NOTE — ED Notes (Signed)
Report given to [facility] and to Nurse on [place] with [facility] at this time.

## 2020-08-30 NOTE — ED Notes (Signed)
Patient is being discharged from the Urgent Care and sent to the Emergency Department via POV. Per provider, patient is in need of higher level of care due to significant cardiac history and symptomatic presentation. Patient is aware and verbalizes understanding of plan of care. There were no vitals filed for this visit.

## 2020-08-30 NOTE — ED Provider Notes (Signed)
[facility] EMERGENCY DEPARTMENT Provider Note   CSN: [id number] Arrival date & time: 08/30/20  1540     History Chief Complaint  Patient presents with  . Chest Pain    [name] is a 75 y.o. female.  Pt with hx of HTN, hyperlipidemia, DM, CKD, RA, OSA, cardiac stent placed in 2018 who presents with CP.  She states it started last night.  She describes as a pressure/heaviness in her left breast that goes down her left arm.  She says its been interim mitten although the dull heaviness has been present fairly constantly but at times it gets worse and last about 2 to 3 minutes.  She has some associated shortness of breath at times and diaphoresis when the pain gets more intense.  She says it does seem to get worse when she walks up and down stairs.  She has had a mild cough over the last couple days and had some sinus congestion about 2 weeks ago but no other symptoms.  No fevers.  No leg swelling.  She took a baby aspirin which was her normal medications today but has not taken any other medications other than her normal meds.  She currently only has very mild aching in her left chest.        Past Medical History:  Diagnosis Date  . Allergy   . Anxiety   . Arthritis    RA (Dr. Ouida Sills) Bilateral hands  . C. difficile diarrhea    history of  . CAD (coronary artery disease), native coronary artery    a. Cardiac CT with + FFR for Ramus b. cath 05/2017 100% sub acute lesion to mid circumflex artery s/p DES.  . Cancer ([place])    Right kidney CA removed.   . CKD (chronic kidney disease) stage 3, GFR 30-59 ml/min ([place]) 10/11/2016   s/p R nephrectomy  . Colon polyps 2008   HYPERPLASTIC  . Costochondritis    a. Nuc Stress Test 6/16: EF 70, no scar or ischemia, low risk // b. Echo 3/16: mild conc LVH, EF 65-70, no RWMA, Gr 1 DD, mild TR  . Diabetes mellitus without complication ([place])    type 2  no meds  . Dyspnea   . Gastritis   . GERD (gastroesophageal reflux disease)    . History of echocardiogram    Echo 3/18:  Moderate LVH, EF [id number], grade 1 diastolic dysfunction, calcified aortic valve, mild MR, moderate LAE  . History of nuclear stress test    Myoview 3/18: Mod size and intensity fixed septal defect, may be artifact.Opposite mod size and intensity lat defect, which is reversible and could represent ischemia or possibly artifact (SDS 4). LVEF 71% with normal wall motion. Intermediate risk study. >> images reviewed with Dr. Dorris Carnes - no sig ischemia; med rx   . History of pneumonia   . Hx of cardiovascular stress test    Lexiscan Myoview 6/16:  EF 70%, no scar or ischemia; Low Risk  . Hyperlipidemia   . Hypertension   . Neuromuscular disorder ([place])    Neruopathy in bilateral feet  . Neuropathy   . Orthostatic hypotension 05/28/2017  . Osteoarthritis   . Pneumonia   . S/P angioplasty with stent 05/27/17 to LCX with DES  05/28/2017  . Thyroid disease     Patient Active Problem List   Diagnosis Date Noted  . Chest pain 08/30/2020  . Malignant neoplasm of kidney excluding renal pelvis, unspecified laterality ([place]) 09/26/2019  .  Vitamin D deficiency, unspecified 09/26/2019  . Anxiety disorder, unspecified 03/26/2019  . Tobacco use disorder 09/29/2018  . Cervical myelopathy ([place]) 05/29/2018  . ANA positive 12/08/2017  . Cervicalgia 09/16/2017  . Carpal tunnel syndrome of left wrist 07/21/2017  . Chondrocalcinosis 07/21/2017  . Neuropathy 07/21/2017  . Orthostatic hypotension 05/28/2017  . S/P angioplasty with stent 05/27/17 to LCX with DES  05/28/2017  . CAD (coronary artery disease)   . Polyneuropathy 05/25/2017  . Chest pain with moderate risk for cardiac etiology 05/25/2017  . Class 1 obesity with serious comorbidity and body mass index (BMI) of 34.0 to 34.9 in adult 12/06/2016  . CKD (chronic kidney disease) stage 3, GFR 30-59 ml/min ([facility]) 10/11/2016  . Hot flashes, menopausal 10/01/2016  . Chest pain with moderate risk of acute coronary  syndrome 10/25/2014  . Degenerative spondylolisthesis 10/15/2014  . Spinal stenosis, lumbar region, with neurogenic claudication 02/12/2013  . DM (diabetes mellitus), type 2 with renal complications ([place]) [id number]  . History of Rtr nephrectomy 2010, secondary to renal cell cancer 01/14/2009  . PERSONAL HX COLONIC POLYPS 11/14/2008  . Hyperlipidemia associated with type 2 diabetes mellitus ([place]) 11/16/2007  . ESOPHAGEAL STRICTURE 10/12/2007  . Rheumatoid arthritis ([place]) 02/27/2007  . Hypertension with heart disease 01/19/2007  . Allergic rhinitis 01/19/2007  . GERD 01/19/2007    Past Surgical History:  Procedure Laterality Date  . ABDOMINAL HYSTERECTOMY  1978  . ANTERIOR CERVICAL DECOMP/DISCECTOMY FUSION N/A 05/29/2018   Procedure: ANTERIOR CERVICAL DECOMPRESSION FUSION - CERVICAL FIVE-CERVICAL SIX - CERVICAL SIX-CERVICAL SEVEN;  Surgeon: Earnie Larsson, MD;  Location: [facility];  Service: Neurosurgery;  Laterality: N/A;  . BACK SURGERY     x 2  . CARPAL TUNNEL RELEASE Left   . COLONOSCOPY W/ BIOPSIES AND POLYPECTOMY     Hx: of  . CORONARY STENT INTERVENTION N/A 05/27/2017   Procedure: CORONARY STENT INTERVENTION;  Surgeon: Burnell Blanks, MD;  Location: [facility];  Service: Cardiovascular;  Laterality: N/A;  . ESOPHAGOGASTRODUODENOSCOPY    . HNP    . LEFT HEART CATH AND CORONARY ANGIOGRAPHY N/A 05/27/2017   Procedure: LEFT HEART CATH AND CORONARY ANGIOGRAPHY;  Surgeon: Burnell Blanks, MD;  Location: [facility];  Service: Cardiovascular;  Laterality: N/A;  . LUMBAR LAMINECTOMY/DECOMPRESSION MICRODISCECTOMY Left 02/12/2013   Procedure: LUMBAR TWO THREE, LUMBAR THREE FOUR, LUMBAR FOUR FIVE  LAMINECTOMY/DECOMPRESSION MICRODISCECTOMY 3 LEVELS;  Surgeon: Charlie Pitter, MD;  Location: [facility];  Service: Neurosurgery;  Laterality: Left;  . NEPHRECTOMY Right 2010   10.rcc cancer  . TOTAL KNEE ARTHROPLASTY Right    Redo     OB History   No obstetric  history on file.     Family History  Problem Relation Age of Onset  . Rheum arthritis Mother   . Stroke Mother   . Prostate cancer Father   . Heart disease Father   . Diabetes Brother   . Dementia Brother   . Parkinsonism Brother   . Kidney disease Son   . Diabetes Brother   . Cancer Brother   . Dementia Brother   . Colon cancer Neg Hx   . Esophageal cancer Neg Hx   . Pancreatic cancer Neg Hx   . Liver disease Neg Hx   . Rectal cancer Neg Hx   . Stomach cancer Neg Hx     Social History   Tobacco Use  . Smoking status: Current Every Day Smoker    Packs/day: 0.20  Years: 15.00    Pack years: 3.00    Types: Cigarettes  . Smokeless tobacco: Never Used  . Tobacco comment: 03/16/17  1 pk/week  Vaping Use  . Vaping Use: Never used  Substance Use Topics  . Alcohol use: No    Alcohol/week: 0.0 standard drinks  . Drug use: No    Home Medications Prior to Admission medications   Medication Sig Start Date End Date Taking? Authorizing Provider  acetaminophen (TYLENOL) 500 MG tablet Take 1,000 mg by mouth as needed for moderate pain or headache.     [provider]  albuterol (VENTOLIN HFA) 108 (90 Base) MCG/ACT inhaler INHALE ONE OR TWO PUFFS BY MOUTH INTO THE LUNGS EVERY SIX HOURS AS NEEDED FOR WHEEZING OR SHORTNESS OF BREATH 08/08/20   [name], [name], MD  Alirocumab (PRALUENT) 75 MG/ML SOAJ Inject 75 mg into the skin every 14 (fourteen) days. 06/10/20   Fay Records, MD  aspirin 81 MG chewable tablet Chew 81 mg by mouth daily.    [provider]  clopidogrel (PLAVIX) 75 MG tablet Take 75 mg by mouth daily. 06/28/20   [provider]  colchicine 0.6 MG tablet Take 0.5 tablets (0.3 mg total) by mouth daily. Patient not taking: Reported on 08/26/2020 06/19/19   Bo Merino, MD  diclofenac sodium (VOLTAREN) 1 % GEL Apply 2 g topically as needed (for arm pain).  Patient not taking: No sig reported    [provider]  DULoxetine  (CYMBALTA) 30 MG capsule Take 1 capsule (30 mg total) by mouth daily. 07/07/20   [name], [name], MD  ezetimibe (ZETIA) 10 MG tablet Take 1 tablet (10 mg total) by mouth daily. 04/09/20   Fay Records, MD  fluticasone (FLONASE) 50 MCG/ACT nasal spray PLACE 2 SPRAYS INTO BOTH NOSTRILS AT BEDTIME AS NEEDED FOR ALLERGIES. 06/16/20   [name], [name], MD  gabapentin (NEURONTIN) 100 MG capsule Take 1 capsule (100 mg total) by mouth 3 (three) times daily. 03/03/20   Ward Givens, NP  gabapentin (NEURONTIN) 400 MG capsule Take 1 capsule (400 mg total) by mouth 2 (two) times daily. 03/03/20   Ward Givens, NP  losartan (COZAAR) 100 MG tablet TAKE ONE TABLET BY MOUTH ONE TIME DAILY 02/19/20   [name], [name], MD  metoprolol tartrate (LOPRESSOR) 50 MG tablet TAKE ONE TABLET BY MOUTH TWICE DAILY 02/27/20   [name], [name], MD  MYRBETRIQ 25 MG TB24 tablet Take 25 mg by mouth daily. 04/22/20   [provider]  nitroGLYCERIN (NITROSTAT) 0.4 MG SL tablet Place 1 tablet (0.4 mg total) under the tongue every 5 (five) minutes as needed for chest pain. Patient not taking: Reported on 08/26/2020 07/09/19   Fay Records, MD  omeprazole (PRILOSEC) 20 MG capsule Take 1 capsule (20 mg total) by mouth 2 (two) times daily before a meal. 12/04/19   Lemmon, Lavone Nian, PA  polyethylene glycol powder (GLYCOLAX/MIRALAX) powder Take 17 g by mouth daily. Patient taking differently: Take 17 g by mouth every other day. 05/03/17   Levin Erp, PA  rosuvastatin (CRESTOR) 20 MG tablet Take 1 tablet (20 mg total) by mouth daily. 06/10/20   Fay Records, MD  triamterene-hydrochlorothiazide [facility]) 37.5-25 MG tablet TAKE HALF TABLET BY MOUTH DAILY 07/01/20   Fay Records, MD    Allergies    Oxycodone-acetaminophen, Percocet [oxycodone-acetaminophen], Pravastatin sodium, Hydrocodone, and Aspirin  Review of Systems   Review of Systems  Constitutional: Positive for diaphoresis and fatigue.  Negative for chills and  fever.  HENT: Negative for congestion, rhinorrhea and sneezing.   Eyes: Negative.   Respiratory: Positive for shortness of breath. Negative for cough and chest tightness.   Cardiovascular: Positive for chest pain. Negative for leg swelling.  Gastrointestinal: Negative for abdominal pain, blood in stool, diarrhea, nausea and vomiting.  Genitourinary: Negative for difficulty urinating, flank pain, frequency and hematuria.  Musculoskeletal: Negative for arthralgias and back pain.  Skin: Negative for rash.  Neurological: Negative for dizziness, speech difficulty, weakness, numbness and headaches.    Physical Exam Updated Vital Signs BP 135/65 (BP Location: Right Arm)   Pulse 62   Temp 98.4 F (36.9 C) (Oral)   Resp 20   Ht 5\' 6"  (1.676 m)   Wt 97.5 kg   SpO2 98%   BMI 34.70 kg/m   Physical Exam Constitutional:      Appearance: She is well-developed and well-nourished.  HENT:     Head: Normocephalic and atraumatic.  Eyes:     Pupils: Pupils are equal, round, and reactive to light.  Cardiovascular:     Rate and Rhythm: Normal rate and regular rhythm.     Heart sounds: Normal heart sounds.  Pulmonary:     Effort: Pulmonary effort is normal. No respiratory distress.     Breath sounds: Normal breath sounds. No wheezing or rales.  Chest:     Chest wall: No tenderness.  Abdominal:     General: Bowel sounds are normal.     Palpations: Abdomen is soft.     Tenderness: There is no abdominal tenderness. There is no guarding or rebound.  Musculoskeletal:        General: No edema. Normal range of motion.     Cervical back: Normal range of motion and neck supple.     Comments: No edema or calf tenderness  Lymphadenopathy:     Cervical: No cervical adenopathy.  Skin:    General: Skin is warm and dry.     Findings: No rash.  Neurological:     Mental Status: She is alert and oriented to person, place, and time.  Psychiatric:        Mood and Affect: Mood and affect normal.      ED Results / Procedures / Treatments   Labs (all labs ordered are listed, but only abnormal results are displayed) Labs Reviewed  CBC WITH DIFFERENTIAL/PLATELET - Abnormal; Notable for the following components:      Result Value   RDW 15.7 (*)    All other components within normal limits  BASIC METABOLIC PANEL - Abnormal; Notable for the following components:   Glucose, Bld 108 (*)    Creatinine, Ser 1.51 (*)    GFR, Estimated 36 (*)    All other components within normal limits  SARS CORONAVIRUS 2 (TAT 6-24 HRS)  TROPONIN I (HIGH SENSITIVITY)  TROPONIN I (HIGH SENSITIVITY)    EKG EKG Interpretation  Date/Time:  Saturday August 30 2020 15:51:17 EST Ventricular Rate:  61 PR Interval:    QRS Duration: 88 QT Interval:  398 QTC Calculation: 401 R Axis:   -19 Text Interpretation: Sinus rhythm Borderline left axis deviation Baseline wander in lead(s) III since last tracing no significant change Confirmed by Malvin Johns [phone]) on 08/30/2020 3:54:57 PM   Radiology DG Chest 2 View  Result Date: 08/30/2020 CLINICAL DATA:  Chest pain.  Shortness of breath. EXAM: CHEST - 2 VIEW COMPARISON:  July 07, 2020 FINDINGS: There is scattered bilateral linear airspace opacities,  not substantially changed from prior study. There is no pneumothorax. No large pleural effusion. The heart size is stable. IMPRESSION: No acute cardiopulmonary process. Electronically Signed   By: Constance Holster M.D.   On: 08/30/2020 16:46    Procedures Procedures   Medications Ordered in ED Medications  aspirin chewable tablet 324 mg (324 mg Oral Given 08/30/20 1638)    ED Course  I have reviewed the triage vital signs and the nursing notes.  Pertinent labs & imaging results that were available during my care of the patient were reviewed by me and considered in my medical decision making (see chart for details).    MDM Rules/Calculators/A&P                          Patient is a 75 year old  female who presents with chest pain. She does have some exertional symptoms. Her pain is almost completely gone at this point. She was given a full-strength aspirin in the ED. Her EKG does not show any ischemic changes. Her chest x-ray is clear without evidence of pneumonia or edema. Her labs are nonconcerning. Her creatinine is elevated but similar to prior values. She has had 2 - troponins. Her heart score is 4. Given her elevated heart score and exertional symptoms, she was admitted for further evaluation. I spoke with Dr. Cyd Silence who has accepted the pat for transfer to [facility]. Final Clinical Impression(s) / ED Diagnoses Final diagnoses:  Chest pain, unspecified type    Rx / DC Orders ED Discharge Orders    None       Malvin Johns, MD 08/30/20 2001

## 2020-08-31 ENCOUNTER — Observation Stay (HOSPITAL_BASED_OUTPATIENT_CLINIC_OR_DEPARTMENT_OTHER): Payer: Medicare HMO

## 2020-08-31 ENCOUNTER — Encounter (HOSPITAL_COMMUNITY): Payer: Self-pay | Admitting: Internal Medicine

## 2020-08-31 DIAGNOSIS — R69 Illness, unspecified: Secondary | ICD-10-CM | POA: Diagnosis not present

## 2020-08-31 DIAGNOSIS — Z85528 Personal history of other malignant neoplasm of kidney: Secondary | ICD-10-CM | POA: Diagnosis not present

## 2020-08-31 DIAGNOSIS — I2510 Atherosclerotic heart disease of native coronary artery without angina pectoris: Secondary | ICD-10-CM

## 2020-08-31 DIAGNOSIS — I129 Hypertensive chronic kidney disease with stage 1 through stage 4 chronic kidney disease, or unspecified chronic kidney disease: Secondary | ICD-10-CM | POA: Diagnosis not present

## 2020-08-31 DIAGNOSIS — Z7982 Long term (current) use of aspirin: Secondary | ICD-10-CM | POA: Diagnosis not present

## 2020-08-31 DIAGNOSIS — R079 Chest pain, unspecified: Secondary | ICD-10-CM | POA: Diagnosis not present

## 2020-08-31 DIAGNOSIS — E1122 Type 2 diabetes mellitus with diabetic chronic kidney disease: Secondary | ICD-10-CM | POA: Diagnosis not present

## 2020-08-31 DIAGNOSIS — I10 Essential (primary) hypertension: Secondary | ICD-10-CM

## 2020-08-31 DIAGNOSIS — I119 Hypertensive heart disease without heart failure: Secondary | ICD-10-CM | POA: Diagnosis not present

## 2020-08-31 DIAGNOSIS — Z96651 Presence of right artificial knee joint: Secondary | ICD-10-CM | POA: Diagnosis not present

## 2020-08-31 DIAGNOSIS — Z20822 Contact with and (suspected) exposure to covid-19: Secondary | ICD-10-CM | POA: Diagnosis not present

## 2020-08-31 DIAGNOSIS — N183 Chronic kidney disease, stage 3 unspecified: Secondary | ICD-10-CM

## 2020-08-31 DIAGNOSIS — F17210 Nicotine dependence, cigarettes, uncomplicated: Secondary | ICD-10-CM | POA: Diagnosis not present

## 2020-08-31 DIAGNOSIS — Z79899 Other long term (current) drug therapy: Secondary | ICD-10-CM | POA: Diagnosis not present

## 2020-08-31 LAB — CBC
HCT: 41.2 % (ref 36.0–46.0)
Hemoglobin: 13.3 g/dL (ref 12.0–15.0)
MCH: 29.8 pg (ref 26.0–34.0)
MCHC: 32.3 g/dL (ref 30.0–36.0)
MCV: 92.2 fL (ref 80.0–100.0)
Platelets: 218 10*3/uL (ref 150–400)
RBC: 4.47 MIL/uL (ref 3.87–5.11)
RDW: 15.7 % — ABNORMAL HIGH (ref 11.5–15.5)
WBC: 8.3 10*3/uL (ref 4.0–10.5)
nRBC: 0 % (ref 0.0–0.2)

## 2020-08-31 LAB — ECHOCARDIOGRAM COMPLETE
Area-P 1/2: 2.24 cm2
Height: 66 in
S' Lateral: 2.9 cm
Weight: 3696 oz

## 2020-08-31 LAB — BASIC METABOLIC PANEL
Anion gap: 9 (ref 5–15)
BUN: 18 mg/dL (ref 8–23)
CO2: 25 mmol/L (ref 22–32)
Calcium: 9.3 mg/dL (ref 8.9–10.3)
Chloride: 107 mmol/L (ref 98–111)
Creatinine, Ser: 1.48 mg/dL — ABNORMAL HIGH (ref 0.44–1.00)
GFR, Estimated: 36 mL/min — ABNORMAL LOW (ref >60)
Glucose, Bld: 118 mg/dL — ABNORMAL HIGH (ref 70–99)
Potassium: 4.1 mmol/L (ref 3.5–5.1)
Sodium: 141 mmol/L (ref 135–145)

## 2020-08-31 LAB — LIPID PANEL
Cholesterol: 85 mg/dL (ref 0–200)
HDL: 44 mg/dL (ref >40)
LDL Cholesterol: 21 mg/dL (ref 0–99)
Total CHOL/HDL Ratio: 1.9 RATIO
Triglycerides: 99 mg/dL (ref <150)
VLDL: 20 mg/dL (ref 0–40)

## 2020-08-31 LAB — SARS CORONAVIRUS 2 (TAT 6-24 HRS): SARS Coronavirus 2: NEGATIVE

## 2020-08-31 LAB — MAGNESIUM: Magnesium: 1.6 mg/dL — ABNORMAL LOW (ref 1.7–2.4)

## 2020-08-31 MED ORDER — METOPROLOL TARTRATE 12.5 MG HALF TABLET
12.5000 mg | ORAL_TABLET | Freq: Two times a day (BID) | ORAL | Status: DC
  Administered 2020-08-31 – 2020-09-01 (×4): 12.5 mg via ORAL
  Filled 2020-08-31 (×4): qty 1

## 2020-08-31 MED ORDER — POLYETHYLENE GLYCOL 3350 17 G PO PACK
17.0000 g | PACK | Freq: Every day | ORAL | Status: DC
  Filled 2020-08-31 (×2): qty 1

## 2020-08-31 MED ORDER — HYDROMORPHONE HCL 1 MG/ML IJ SOLN: 0.5000 mg | INTRAMUSCULAR | Status: DC | PRN

## 2020-08-31 MED ORDER — EZETIMIBE 10 MG PO TABS
10.0000 mg | ORAL_TABLET | Freq: Every day | ORAL | Status: DC
  Administered 2020-08-31 – 2020-09-01 (×2): 10 mg via ORAL
  Filled 2020-08-31 (×2): qty 1

## 2020-08-31 MED ORDER — FLUTICASONE PROPIONATE 50 MCG/ACT NA SUSP
2.0000 | Freq: Every evening | NASAL | Status: DC | PRN
  Administered 2020-08-31: 2 via NASAL
  Filled 2020-08-31: qty 16

## 2020-08-31 MED ORDER — DULOXETINE HCL 30 MG PO CPEP
30.0000 mg | ORAL_CAPSULE | Freq: Every day | ORAL | Status: DC
  Administered 2020-08-31 – 2020-09-01 (×2): 30 mg via ORAL
  Filled 2020-08-31 (×2): qty 1

## 2020-08-31 MED ORDER — ASPIRIN 81 MG PO CHEW
81.0000 mg | CHEWABLE_TABLET | Freq: Every day | ORAL | Status: DC
  Administered 2020-08-31 – 2020-09-01 (×2): 81 mg via ORAL
  Filled 2020-08-31 (×2): qty 1

## 2020-08-31 MED ORDER — GABAPENTIN 100 MG PO CAPS
100.0000 mg | ORAL_CAPSULE | Freq: Two times a day (BID) | ORAL | Status: DC
  Administered 2020-08-31 – 2020-09-01 (×2): 100 mg via ORAL
  Filled 2020-08-31 (×2): qty 1

## 2020-08-31 MED ORDER — MIRABEGRON ER 25 MG PO TB24
25.0000 mg | ORAL_TABLET | Freq: Every day | ORAL | Status: DC
  Administered 2020-08-31 – 2020-09-01 (×2): 25 mg via ORAL
  Filled 2020-08-31 (×2): qty 1

## 2020-08-31 MED ORDER — CLOPIDOGREL BISULFATE 75 MG PO TABS
75.0000 mg | ORAL_TABLET | Freq: Every day | ORAL | Status: DC
  Administered 2020-08-31 – 2020-09-01 (×2): 75 mg via ORAL
  Filled 2020-08-31 (×2): qty 1

## 2020-08-31 MED ORDER — ROSUVASTATIN CALCIUM 20 MG PO TABS
20.0000 mg | ORAL_TABLET | Freq: Every day | ORAL | Status: DC
Start: 2020-08-31 — End: 2020-09-01
  Administered 2020-08-31 – 2020-09-01 (×2): 20 mg via ORAL
  Filled 2020-08-31 (×2): qty 1

## 2020-08-31 MED ORDER — PANTOPRAZOLE SODIUM 40 MG PO TBEC
40.0000 mg | DELAYED_RELEASE_TABLET | Freq: Every day | ORAL | Status: DC
  Administered 2020-08-31 – 2020-09-01 (×2): 40 mg via ORAL
  Filled 2020-08-31 (×2): qty 1

## 2020-08-31 MED ORDER — SODIUM CHLORIDE 0.9 % IV SOLN
INTRAVENOUS | Status: AC
  Administered 2020-08-31: 30 mL/h via INTRAVENOUS

## 2020-08-31 MED ORDER — ALBUTEROL SULFATE HFA 108 (90 BASE) MCG/ACT IN AERS: 2.0000 | INHALATION_SPRAY | Freq: Four times a day (QID) | RESPIRATORY_TRACT | Status: DC | PRN

## 2020-08-31 MED ORDER — MAGNESIUM SULFATE 2 GM/50ML IV SOLN
2.0000 g | Freq: Once | INTRAVENOUS | Status: AC
  Administered 2020-08-31: 2 g via INTRAVENOUS
  Filled 2020-08-31: qty 50

## 2020-08-31 MED ORDER — GABAPENTIN 100 MG PO CAPS
100.0000 mg | ORAL_CAPSULE | Freq: Three times a day (TID) | ORAL | Status: DC
  Administered 2020-08-31: 100 mg via ORAL
  Filled 2020-08-31: qty 1

## 2020-08-31 NOTE — Progress Notes (Signed)
  Echocardiogram 2D Echocardiogram has been performed.  Adhrit Krenz G Cristalle Rohm 08/31/2020, 10:00 AM

## 2020-08-31 NOTE — Progress Notes (Signed)
Pt stated that she only takes gabapentin BID. Notified Dr. Cruzita Lederer, order changed. Carroll Kinds RN

## 2020-08-31 NOTE — H&P (Addendum)
History and Physical  [name] YPP:[id number] DOB: [birth year] DOA: 08/30/2020  Referring physician: Accepted by Dr. Cyd Silence, [place]. PCP: [name], [name], MD  Outpatient Specialists: Cardiology. Patient coming from: Home.  Chief Complaint: Chest pain  HPI: [name] is a 75 y.o. female with medical history significant for coronary artery disease status post PCI with stent placement in 2018, renal cell carcinoma post right nephrectomy, hyperlipidemia, type 2 diabetes, GERD, CKD 3B, rheumatoid arthritis, obesity, diabetic neuropathy, who presented to [facility] emergency department with complaints of chest discomfort.  Onset Friday with exertion while helping her son move out of her house, they ate a [place] meal so she thought it was related to her acid reflux.  The following morning her left sided chest pain woke her out of her sleep.  8/10, pressure-like in quality, radiating down the left arm, worse with exertion.  The pain is similar but less intense than prior chest pain that led to PCI with stent.  She presented to the ED for further evaluation.  Reports compliance with her medications including aspirin Plavix and Crestor.  In the ED 2 sets of troponin were unremarkable, EKG revealed no evidence of acute ischemia, chest x-ray was unremarkable. Due to heart score of 4, EDP requested admission for observation and further management. Cardiology will need to be consulted in the morning. TR[facility] are asked to admit.  ED Course: Afebrile. Vital signs within normal limits. Lab studies remarkable for negative troponin x2, creatinine 1.51 with baseline 1.4, CBC was unremarkable.  Review of Systems: Review of systems as noted in the HPI. All other systems reviewed and are negative.   Past Medical History:  Diagnosis Date  . Allergy   . Anxiety   . Arthritis    RA (Dr. Ouida Sills) Bilateral hands  . C. difficile diarrhea    history of  . CAD (coronary artery disease), native coronary artery     a. Cardiac CT with + FFR for Ramus b. cath 05/2017 100% sub acute lesion to mid circumflex artery s/p DES.  . Cancer ([employer])    Right kidney CA removed.   . CKD (chronic kidney disease) stage 3, GFR 30-59 ml/min ([place]) 10/11/2016   s/p R nephrectomy  . Colon polyps 2008   HYPERPLASTIC  . Costochondritis    a. Nuc Stress Test 6/16: EF 70, no scar or ischemia, low risk // b. Echo 3/16: mild conc LVH, EF 65-70, no RWMA, Gr 1 DD, mild TR  . Diabetes mellitus without complication ([place])    type 2  no meds  . Dyspnea   . Gastritis   . GERD (gastroesophageal reflux disease)   . History of echocardiogram    Echo 3/18:  Moderate LVH, EF 45-80, grade 1 diastolic dysfunction, calcified aortic valve, mild MR, moderate LAE  . History of nuclear stress test    Myoview 3/18: Mod size and intensity fixed septal defect, may be artifact.Opposite mod size and intensity lat defect, which is reversible and could represent ischemia or possibly artifact (SDS 4). LVEF 71% with normal wall motion. Intermediate risk study. >> images reviewed with Dr. Dorris Carnes - no sig ischemia; med rx   . History of pneumonia   . Hx of cardiovascular stress test    Lexiscan Myoview 6/16:  EF 70%, no scar or ischemia; Low Risk  . Hyperlipidemia   . Hypertension   . Neuromuscular disorder ([place])    Neruopathy in bilateral feet  . Neuropathy   .  Orthostatic hypotension 05/28/2017  . Osteoarthritis   . Pneumonia   . S/P angioplasty with stent 05/27/17 to LCX with DES  05/28/2017  . Thyroid disease    Past Surgical History:  Procedure Laterality Date  . ABDOMINAL HYSTERECTOMY  1978  . ANTERIOR CERVICAL DECOMP/DISCECTOMY FUSION N/A 05/29/2018   Procedure: ANTERIOR CERVICAL DECOMPRESSION FUSION - CERVICAL FIVE-CERVICAL SIX - CERVICAL SIX-CERVICAL SEVEN;  Surgeon: Earnie Larsson, MD;  Location: [facility];  Service: Neurosurgery;  Laterality: N/A;  . BACK SURGERY     x 2  . CARPAL TUNNEL RELEASE Left   . COLONOSCOPY W/ BIOPSIES AND  POLYPECTOMY     Hx: of  . CORONARY STENT INTERVENTION N/A 05/27/2017   Procedure: CORONARY STENT INTERVENTION;  Surgeon: Burnell Blanks, MD;  Location: [facility];  Service: Cardiovascular;  Laterality: N/A;  . ESOPHAGOGASTRODUODENOSCOPY    . HNP    . LEFT HEART CATH AND CORONARY ANGIOGRAPHY N/A 05/27/2017   Procedure: LEFT HEART CATH AND CORONARY ANGIOGRAPHY;  Surgeon: Burnell Blanks, MD;  Location: [facility];  Service: Cardiovascular;  Laterality: N/A;  . LUMBAR LAMINECTOMY/DECOMPRESSION MICRODISCECTOMY Left 02/12/2013   Procedure: LUMBAR TWO THREE, LUMBAR THREE FOUR, LUMBAR FOUR FIVE  LAMINECTOMY/DECOMPRESSION MICRODISCECTOMY 3 LEVELS;  Surgeon: Charlie Pitter, MD;  Location: [facility];  Service: Neurosurgery;  Laterality: Left;  . NEPHRECTOMY Right 2010   10.rcc cancer  . TOTAL KNEE ARTHROPLASTY Right    Redo    Social History:  reports that she has been smoking cigarettes. She has a 3.00 pack-year smoking history. She has never used smokeless tobacco. She reports that she does not drink alcohol and does not use drugs.   Allergies  Allergen Reactions  . Oxycodone-Acetaminophen Hives and Itching    hallucinations  . Percocet [Oxycodone-Acetaminophen] Hives, Itching and Other (See Comments)    hallucinations  . Pravastatin Sodium Other (See Comments)    Muscle aches Muscle aches  . Hydrocodone Other (See Comments)    "crazy dreams"  . Aspirin Other (See Comments)    GI upset Other reaction(s): Other (See Comments) REACTION: GI upset    Family History  Problem Relation Age of Onset  . Rheum arthritis Mother   . Stroke Mother   . Prostate cancer Father   . Heart disease Father   . Diabetes Brother   . Dementia Brother   . Parkinsonism Brother   . Kidney disease Son   . Diabetes Brother   . Cancer Brother   . Dementia Brother   . Colon cancer Neg Hx   . Esophageal cancer Neg Hx   . Pancreatic cancer Neg Hx   . Liver disease Neg Hx    . Rectal cancer Neg Hx   . Stomach cancer Neg Hx       Prior to Admission medications   Medication Sig Start Date End Date Taking? Authorizing Provider  acetaminophen (TYLENOL) 500 MG tablet Take 1,000 mg by mouth as needed for moderate pain or headache.     [provider]  albuterol (VENTOLIN HFA) 108 (90 Base) MCG/ACT inhaler INHALE ONE OR TWO PUFFS BY MOUTH INTO THE LUNGS EVERY SIX HOURS AS NEEDED FOR WHEEZING OR SHORTNESS OF BREATH 08/08/20   [name], [name], MD  Alirocumab (PRALUENT) 75 MG/ML SOAJ Inject 75 mg into the skin every 14 (fourteen) days. 06/10/20   Fay Records, MD  aspirin 81 MG chewable tablet Chew 81 mg by mouth daily.    [provider]  clopidogrel (PLAVIX) 75 MG tablet Take 75 mg by mouth daily. 06/28/20   [provider]  colchicine 0.6 MG tablet Take 0.5 tablets (0.3 mg total) by mouth daily. Patient not taking: Reported on 08/26/2020 06/19/19   Bo Merino, MD  diclofenac sodium (VOLTAREN) 1 % GEL Apply 2 g topically as needed (for arm pain).  Patient not taking: No sig reported    [provider]  DULoxetine (CYMBALTA) 30 MG capsule Take 1 capsule (30 mg total) by mouth daily. 07/07/20   [name], [name], MD  ezetimibe (ZETIA) 10 MG tablet Take 1 tablet (10 mg total) by mouth daily. 04/09/20   Fay Records, MD  fluticasone (FLONASE) 50 MCG/ACT nasal spray PLACE 2 SPRAYS INTO BOTH NOSTRILS AT BEDTIME AS NEEDED FOR ALLERGIES. 06/16/20   [name], [name], MD  gabapentin (NEURONTIN) 100 MG capsule Take 1 capsule (100 mg total) by mouth 3 (three) times daily. 03/03/20   Ward Givens, NP  gabapentin (NEURONTIN) 400 MG capsule Take 1 capsule (400 mg total) by mouth 2 (two) times daily. 03/03/20   Ward Givens, NP  losartan (COZAAR) 100 MG tablet TAKE ONE TABLET BY MOUTH ONE TIME DAILY 02/19/20   [name], [name], MD  metoprolol tartrate (LOPRESSOR) 50 MG tablet TAKE ONE TABLET BY MOUTH TWICE DAILY 02/27/20   [name], [name], MD   MYRBETRIQ 25 MG TB24 tablet Take 25 mg by mouth daily. 04/22/20   [provider]  nitroGLYCERIN (NITROSTAT) 0.4 MG SL tablet Place 1 tablet (0.4 mg total) under the tongue every 5 (five) minutes as needed for chest pain. Patient not taking: Reported on 08/26/2020 07/09/19   Fay Records, MD  omeprazole (PRILOSEC) 20 MG capsule Take 1 capsule (20 mg total) by mouth 2 (two) times daily before a meal. 12/04/19   Lemmon, Lavone Nian, PA  polyethylene glycol powder (GLYCOLAX/MIRALAX) powder Take 17 g by mouth daily. Patient taking differently: Take 17 g by mouth every other day. 05/03/17   Levin Erp, PA  rosuvastatin (CRESTOR) 20 MG tablet Take 1 tablet (20 mg total) by mouth daily. 06/10/20   Fay Records, MD  triamterene-hydrochlorothiazide [facility]) 37.5-25 MG tablet TAKE HALF TABLET BY MOUTH DAILY 07/01/20   Fay Records, MD    Physical Exam: BP 136/83 (BP Location: Left Arm)   Pulse 65   Temp 98.5 F (36.9 C) (Oral)   Resp 18   Ht 5\' 6"  (1.676 m)   Wt 104.8 kg   SpO2 94%   BMI 37.28 kg/m   . General: 75 y.o. year-old female well developed well nourished in no acute distress.  Alert and oriented x3. . Cardiovascular: Regular rate and rhythm with no rubs or gallops.  Left-sided chest tenderness with palpation.  No thyromegaly or JVD noted.  No lower extremity edema. 2/4 pulses in all 4 extremities. [name] Kitchen Respiratory: Clear to auscultation with no wheezes or rales. Good inspiratory effort. . Abdomen: Soft nontender nondistended with normal bowel sounds x4 quadrants. . Muskuloskeletal: No cyanosis, clubbing or edema noted bilaterally . Neuro: CN II-XII intact, strength, sensation, reflexes . Skin: No ulcerative lesions noted or rashes . Psychiatry: Judgement and insight appear normal. Mood is appropriate for condition and setting          Labs on Admission:  Basic Metabolic Panel: Recent Labs  Lab 08/30/20 1607  NA 137  K 3.7  CL 103  CO2 24  GLUCOSE 108*   BUN 19  CREATININE 1.51*  CALCIUM  9.3   Liver Function Tests: No results for input(s): AST, ALT, ALKPHOS, BILITOT, PROT, ALBUMIN in the last 168 hours. No results for input(s): LIPASE, AMYLASE in the last 168 hours. No results for input(s): AMMONIA in the last 168 hours. CBC: Recent Labs  Lab 08/30/20 1607  WBC 7.5  NEUTROABS 2.8  HGB 13.9  HCT 42.0  MCV 91.3  PLT 223   Cardiac Enzymes: No results for input(s): CKTOTAL, CKMB, CKMBINDEX, TROPONINI in the last 168 hours.  BNP (last 3 results) No results for input(s): BNP in the last 8760 hours.  ProBNP (last 3 results) No results for input(s): PROBNP in the last 8760 hours.  CBG: No results for input(s): GLUCAP in the last 168 hours.  Radiological Exams on Admission: DG Chest 2 View  Result Date: 08/30/2020 CLINICAL DATA:  Chest pain.  Shortness of breath. EXAM: CHEST - 2 VIEW COMPARISON:  July 07, 2020 FINDINGS: There is scattered bilateral linear airspace opacities, not substantially changed from prior study. There is no pneumothorax. No large pleural effusion. The heart size is stable. IMPRESSION: No acute cardiopulmonary process. Electronically Signed   By: Constance Holster M.D.   On: 08/30/2020 16:46    EKG: I independently viewed the EKG done and my findings are as followed: Sinus rhythm rate of 61. Nonspecific ST-T changes.  Assessment/Plan Present on Admission: . Chest pain  Active Problems:   Chest pain  Chest pain rule out ACS in the setting of known coronary artery disease post PCI with stent placement in 2018. Due to heart score of 4, EDP requested admission for observation and further evaluation and management.  Received full dose aspirin when she presented to the ED Cardiology will need to be consulted in the morning.  Troponin negative x2 Obtain 2D echo Resume home Lopressor at lower dose 12.5 mg twice daily to avoid hypotension, blood pressures are soft. Obtain lipid panel Resume home  aspirin, Plavix, Crestor and ezetimibe N.p.o. until seen by cardiology for possible stress test.  Known coronary artery disease status post PCI with stent placement in 2018 Resume home regimen She is currently on aspirin 81 mg daily, Plavix 75 mg daily, ezetimibe 10 mg daily, and Crestor 20 mg daily. At the time of this visit she still has chest pain but less intense 3 out of 10 from 8 out of 10.  Not dyspneic at rest.  CKD 3B with single kidney post R nephrectomy Appears to be close to her baseline creatinine 1.47 with GFR of 40. Presented with creatinine of 1.51 with GFR 36 Avoid nephrotoxins, dehydration and hypotension Start gentle IV fluid hydration normal saline at 30 cc/h x 12 hours. Avoid IV contrast agents if able. Repeat renal panel in the AM.  Chronic diastolic CHF Last 2D echo done on 05/27/2017 revealed LVEF 65 to 70% with grade 1 diastolic dysfunction. Resume home Lopressor Losartan on hold due to elevated creatinine. Strict I's and O's and daily weight  Chronic anxiety/depression Resume home duloxetine  Polyneuropathy Resume home gabapentin  Essential hypertension, blood pressures are currently soft Hold off home oral antihypertensives except for home Lopressor at lower dose 12.5 mg twice daily. Maintain MAP greater than 65 Closely monitor vital signs.  GERD Stable Resume home regimen  History of renal cell carcinoma status post right nephrectomy Single kidney Avoid nephrotoxins and dehydration.    DVT prophylaxis: SCDs  Code Status: Full code as stated by the patient herself.  Family Communication: None at bedside.  Disposition Plan:  Accepted  and admitted to telemetry cardiac as observation status by Dr. Cyd Silence  Consults called: Please consult cardiology in the morning.  Admission status: Observation status.   Status is: Observation    Dispo:  Patient From: Home  Planned Disposition: Home  Expected discharge date: 09/01/2020  Medically  stable for discharge: No, ongoing management of chest pain to rule out ACS.      Kayleen Memos MD [facility] Pager [phone]  If 7PM-7AM, please contact night-coverage [email or web address] Password [email or web address]  08/31/2020, 1:02 AM

## 2020-08-31 NOTE — Consult Note (Addendum)
Cardiology Consultation:   Patient ID: OSIA AYE MRN: [id number]; DOB: February 02, 1946  Admit date: 08/30/2020 Date of Consult: 08/31/2020  Primary Care Provider: [name], [name], MD [facility] Cardiologist: Dorris Carnes, MD  [place] Electrophysiologist:  None    Patient Profile:   [name] is a 75 y.o. female with a hx of CAD s/p DES to mLCx 2018 (mild-moderate nonobstructive disease elsewhere), HTN, HLD (prior myalgias limiting statin titration), DM, CKD stage III with prior renal cancer s/p right nephrectomy, RA, OSA, chronic edema, current on/off tobacco use, neuropathy who is being seen today for the evaluation of chest pain at the request of Dr. Cruzita Lederer.  History of Present Illness:   Ms. Kueker has prior cardiac history as outlined above. Last cath 05/2017 showed 100% mLCx tx with DES, otherwise 20% mRCA, 50% ramus, 30% dLAD, 20% prox-mid LAD, 20% mLAD treated medically. Echo at that time showed mild LVH, EF 65-70%, grade 1 DD.  She has been in [place] lately without any exertional angina. Arthritis issues limit her activity some. On Friday she tried a new pasta recipe and later that evening developed intermittent substernal chest discomfort that felt like indigestion. It would be worse with she bent a certain way. She otherwise does not recall if there was anything else that provoked it. She was hurting on/off from about 6pm til 2am. She took something for acid reflux and it eased off and she was able to rest. It would last 2-3 minutes at a time and come and go without provocation. This felt somewhat similar to prior angina except significantly less severe. On Saturday AM she felt terrible when she woke up, non-descript. She felt general malaise. She felt her left arm aching but has had this in the past with her arthritis so wasn't sure if it was related. She continued to have intermittent brief chest pains. In general when she has any kind of pain she states she tends to get hot and  somewhat short of breath. Due to persistence of sx she came to the ED. Labs show troponin neg x 2, CBC wnl, Cr around 1.5 similar baseline. CXR NAD. Around 5pm yesterday her symptoms spontaneously eased off and have not recurred. She did not receive any specific treatment aside from 324mg  ASA. She is getting magnesium repletion today for Mg 1.6. EKG without acute changes, nonspecific TW changes in III. She states she has chronic LEE which she states is changed for her.  BP was reported to be soft on arrival so Maxzide, losartan held and Lopressor decreased to 12.5mg  BID - hypertensive this AM. HR mostly 50s-60s at this time.   Past Medical History:  Diagnosis Date  . Allergy   . Anxiety   . C. difficile diarrhea    history of  . CAD (coronary artery disease), native coronary artery    a. 05/2017 showed 100% mLCx tx with DES, otherwise 20% mRCA, 50% ramus, 30% dLAD, 20% prox-mid LAD, 20% mLAD treated medically.   . Cancer ([place])    Right kidney CA removed.   . CKD (chronic kidney disease) stage 3, GFR 30-59 ml/min ([place]) 10/11/2016   s/p R nephrectomy  . Colon polyps 2008   HYPERPLASTIC  . Diabetes mellitus without complication ([place])    type 2  no meds  . Gastritis   . GERD (gastroesophageal reflux disease)   . History of echocardiogram    Echo 3/18:  Moderate LVH, EF [id number], grade 1 diastolic dysfunction, calcified aortic valve, mild  MR, moderate LAE  . History of nuclear stress test    Myoview 3/18: Mod size and intensity fixed septal defect, may be artifact.Opposite mod size and intensity lat defect, which is reversible and could represent ischemia or possibly artifact (SDS 4). LVEF 71% with normal wall motion. Intermediate risk study. >> images reviewed with Dr. Dorris Carnes - no sig ischemia; med rx   . Hx of cardiovascular stress test    Lexiscan Myoview 6/16:  EF 70%, no scar or ischemia; Low Risk  . Hyperlipidemia   . Hypertension   . Neuropathy   . Orthostatic hypotension  05/28/2017  . Osteoarthritis   . Rheumatoid arthritis (HCC)    RA (Dr. Ouida Sills) Bilateral hands  . S/P angioplasty with stent 05/27/17 to LCX with DES  05/28/2017  . Thyroid disease     Past Surgical History:  Procedure Laterality Date  . ABDOMINAL HYSTERECTOMY  1978  . ANTERIOR CERVICAL DECOMP/DISCECTOMY FUSION N/A 05/29/2018   Procedure: ANTERIOR CERVICAL DECOMPRESSION FUSION - CERVICAL FIVE-CERVICAL SIX - CERVICAL SIX-CERVICAL SEVEN;  Surgeon: Earnie Larsson, MD;  Location: [facility];  Service: Neurosurgery;  Laterality: N/A;  . BACK SURGERY     x 2  . CARPAL TUNNEL RELEASE Left   . COLONOSCOPY W/ BIOPSIES AND POLYPECTOMY     Hx: of  . CORONARY STENT INTERVENTION N/A 05/27/2017   Procedure: CORONARY STENT INTERVENTION;  Surgeon: Burnell Blanks, MD;  Location: [facility];  Service: Cardiovascular;  Laterality: N/A;  . ESOPHAGOGASTRODUODENOSCOPY    . HNP    . LEFT HEART CATH AND CORONARY ANGIOGRAPHY N/A 05/27/2017   Procedure: LEFT HEART CATH AND CORONARY ANGIOGRAPHY;  Surgeon: Burnell Blanks, MD;  Location: [facility];  Service: Cardiovascular;  Laterality: N/A;  . LUMBAR LAMINECTOMY/DECOMPRESSION MICRODISCECTOMY Left 02/12/2013   Procedure: LUMBAR TWO THREE, LUMBAR THREE FOUR, LUMBAR FOUR FIVE  LAMINECTOMY/DECOMPRESSION MICRODISCECTOMY 3 LEVELS;  Surgeon: Charlie Pitter, MD;  Location: [facility];  Service: Neurosurgery;  Laterality: Left;  . NEPHRECTOMY Right 2010   10.rcc cancer  . TOTAL KNEE ARTHROPLASTY Right    Redo     Home Medications:  Prior to Admission medications   Medication Sig Start Date End Date Taking? Authorizing Provider  aspirin 81 MG chewable tablet Chew 81 mg by mouth daily.   Yes [provider]  clopidogrel (PLAVIX) 75 MG tablet Take 75 mg by mouth daily. 06/28/20  Yes [provider]  acetaminophen (TYLENOL) 500 MG tablet Take 1,000 mg by mouth as needed for moderate pain or headache.     [provider]  albuterol (VENTOLIN HFA) 108 (90 Base) MCG/ACT inhaler INHALE ONE OR TWO PUFFS BY MOUTH INTO THE LUNGS EVERY SIX HOURS AS NEEDED FOR WHEEZING OR SHORTNESS OF BREATH 08/08/20   [name], [name], MD  Alirocumab (PRALUENT) 75 MG/ML SOAJ Inject 75 mg into the skin every 14 (fourteen) days. 06/10/20   Fay Records, MD  colchicine 0.6 MG tablet Take 0.5 tablets (0.3 mg total) by mouth daily. Patient not taking: Reported on 08/26/2020 06/19/19   Bo Merino, MD  diclofenac sodium (VOLTAREN) 1 % GEL Apply 2 g topically as needed (for arm pain).  Patient not taking: No sig reported    [provider]  DULoxetine (CYMBALTA) 30 MG capsule Take 1 capsule (30 mg total) by mouth daily. 07/07/20   [name], [name], MD  ezetimibe (ZETIA) 10 MG tablet Take 1 tablet (10 mg total) by mouth daily. 04/09/20  Pricilla Riffleoss, Paula V, MD  fluticasone (FLONASE) 50 MCG/ACT nasal spray PLACE 2 SPRAYS INTO BOTH NOSTRILS AT BEDTIME AS NEEDED FOR ALLERGIES. 06/16/20   Swaziland[place], [name], MD  gabapentin (NEURONTIN) 100 MG capsule Take 1 capsule (100 mg total) by mouth 3 (three) times daily. 03/03/20   Butch PennyMillikan, Megan, NP  gabapentin (NEURONTIN) 400 MG capsule Take 1 capsule (400 mg total) by mouth 2 (two) times daily. 03/03/20   Butch PennyMillikan, Megan, NP  losartan (COZAAR) 100 MG tablet TAKE ONE TABLET BY MOUTH ONE TIME DAILY 02/19/20   [place]azilandJordan, [name], MD  metoprolol tartrate (LOPRESSOR) 50 MG tablet TAKE ONE TABLET BY MOUTH TWICE DAILY 02/27/20   [place]azilandJordan, [name], MD  MYRBETRIQ 25 MG TB24 tablet Take 25 mg by mouth daily. 04/22/20   [provider]  nitroGLYCERIN (NITROSTAT) 0.4 MG SL tablet Place 1 tablet (0.4 mg total) under the tongue every 5 (five) minutes as needed for chest pain. Patient not taking: Reported on 08/26/2020 07/09/19   Pricilla Riffleoss, Paula V, MD  omeprazole (PRILOSEC) 20 MG capsule Take 1 capsule (20 mg total) by mouth 2 (two) times daily before a meal. 12/04/19   Lemmon, Violet BaldyJennifer Lynne, PA  polyethylene glycol  powder (GLYCOLAX/MIRALAX) powder Take 17 g by mouth daily. Patient taking differently: Take 17 g by mouth every other day. 05/03/17   Unk LightningLemmon, Jennifer Lynne, PA  rosuvastatin (CRESTOR) 20 MG tablet Take 1 tablet (20 mg total) by mouth daily. 06/10/20   Pricilla Riffleoss, Paula V, MD  triamterene-hydrochlorothiazide (MAXZIDE-25) 37.5-25 MG tablet TAKE HALF TABLET BY MOUTH DAILY Patient taking differently: Take 0.5 tablets by mouth 2 (two) times daily. 07/01/20   Pricilla Riffleoss, Paula V, MD    Inpatient Medications: Scheduled Meds: . aspirin  81 mg Oral Daily  . clopidogrel  75 mg Oral Daily  . DULoxetine  30 mg Oral Daily  . ezetimibe  10 mg Oral Daily  . gabapentin  100 mg Oral TID  . metoprolol tartrate  12.5 mg Oral BID  . mirabegron ER  25 mg Oral Daily  . pantoprazole  40 mg Oral Daily  . polyethylene glycol  17 g Oral Daily  . rosuvastatin  20 mg Oral Daily   Continuous Infusions: . sodium chloride 30 mL/hr (08/31/20 0626)  . magnesium sulfate bolus IVPB     PRN Meds: albuterol, fluticasone, HYDROmorphone (DILAUDID) injection  Allergies:    Allergies  Allergen Reactions  . Oxycodone-Acetaminophen Hives and Itching    hallucinations  . Percocet [Oxycodone-Acetaminophen] Hives, Itching and Other (See Comments)    hallucinations  . Pravastatin Sodium Other (See Comments)    Muscle aches Muscle aches  . Hydrocodone Other (See Comments)    "crazy dreams"  . Aspirin Other (See Comments)    GI upset Other reaction(s): Other (See Comments) REACTION: GI upset    Social History:   Social History   Socioeconomic History  . Marital status: Divorced    Spouse name: Not on file  . Number of children: 3  . Years of education: 2812  . Highest education level: Not on file  Occupational History  . Occupation: Retired    Associate Professormployer: RETIRED  Tobacco Use  . Smoking status: Current Every Day Smoker    Packs/day: 0.20    Years: 15.00    Pack years: 3.00    Types: Cigarettes  . Smokeless tobacco:  Never Used  Vaping Use  . Vaping Use: Never used  Substance and Sexual Activity  . Alcohol use: No  Alcohol/week: 0.0 standard drinks  . Drug use: No  . Sexual activity: Not on file  Other Topics Concern  . Not on file  Social History Narrative   Single, dgtr lives with her   Current on/off smoker   Alcohol use- no   Drug use-no   Regular Exercise-yes   Social Determinants of Health   Financial Resource Strain: Low Risk   . Difficulty of Paying Living Expenses: Not hard at all  Food Insecurity: No Food Insecurity  . Worried About Charity fundraiser in the Last Year: Never true  . Ran Out of Food in the Last Year: Never true  Transportation Needs: No Transportation Needs  . Lack of Transportation (Medical): No  . Lack of Transportation (Non-Medical): No  Physical Activity: Inactive  . Days of Exercise per Week: 0 days  . Minutes of Exercise per Session: 0 min  Stress: No Stress Concern Present  . Feeling of Stress : Not at all  Social Connections: Moderately Isolated  . Frequency of Communication with Friends and Family: More than three times a week  . Frequency of Social Gatherings with Friends and Family: More than three times a week  . Attends Religious Services: More than 4 times per year  . Active Member of Clubs or Organizations: No  . Attends Archivist Meetings: Never  . Marital Status: Divorced  Human resources officer Violence: Not At Risk  . Fear of Current or Ex-Partner: No  . Emotionally Abused: No  . Physically Abused: No  . Sexually Abused: No    Family History:   Family History  Problem Relation Age of Onset  . Rheum arthritis Mother   . Stroke Mother   . Prostate cancer Father   . Heart disease Father   . Diabetes Brother   . Dementia Brother   . Parkinsonism Brother   . Kidney disease Son   . Diabetes Brother   . Cancer Brother   . Dementia Brother   . Colon cancer Neg Hx   . Esophageal cancer Neg Hx   . Pancreatic cancer Neg Hx   .  Liver disease Neg Hx   . Rectal cancer Neg Hx   . Stomach cancer Neg Hx      ROS:  Please see the history of present illness.   All other ROS reviewed and negative.     Physical Exam/Data:   Vitals:   08/30/20 2230 08/31/20 0057 08/31/20 0401 08/31/20 0859  BP: 137/67 136/83 (!) 133/49 (!) 155/86  Pulse: 73 65 61 78  Resp: 19 18 17 18   Temp:  98.5 F (36.9 C) 98.2 F (36.8 C) 98.2 F (36.8 C)  TempSrc:  Oral Oral Oral  SpO2: 98% 94%  95%  Weight:  104.8 kg    Height:  5\' 6"  (1.676 m)      Intake/Output Summary (Last 24 hours) at 08/31/2020 2951 Last data filed at 08/31/2020 0114 Gross per 24 hour  Intake 360 ml  Output -  Net 360 ml   Last 3 Weights 08/31/2020 08/30/2020 08/26/2020  Weight (lbs) 231 lb 215 lb (No Data)  Weight (kg) 104.781 kg 97.523 kg (No Data)     Body mass index is 37.28 kg/m.  General: Well developed, well nourished AAF, in no acute distress. Head: Normocephalic, atraumatic, sclera non-icteric, no xanthomas, nares are without discharge. Neck: Negative for carotid bruits. JVP not elevated. Lungs: Clear bilaterally to auscultation without wheezes, rales, or rhonchi. Breathing is unlabored. Heart: RRR  S1 S2 without murmurs, rubs, or gallops.  Abdomen: Soft, non-tender, non-distended with normoactive bowel sounds. No rebound/guarding. Extremities: No clubbing or cyanosis. No edema. Distal pedal pulses are 2+ and equal bilaterally. Neuro: Alert and oriented X 3. Moves all extremities spontaneously. Psych:  Responds to questions appropriately with a normal affect.   EKG:  The EKG was personally reviewed and demonstrates: NSR 61bpm, borderline left axis deviation, no acute STT changes, nonspecific TW III  Telemetry:  Telemetry was personally reviewed and demonstrates: NSR  Relevant CV Studies: Cath 05/2017  Mid RCA lesion, 20 %stenosed.  A STENT SYNERGY DES 3X28 drug eluting stent was successfully placed.  Mid Cx lesion, 100 %stenosed.  Post  intervention, there is a 0% residual stenosis.  Ramus lesion, 50 %stenosed.  Dist LAD lesion, 30 %stenosed.  Prox LAD to Mid LAD lesion, 20 %stenosed.  Mid LAD lesion, 20 %stenosed.   1. Mild non-obstructive disease in the LAD and RCA 2. Moderate stenosis in the distal segment of the moderate caliber intermediate branch 3. Chronic total occlusion of the mid Circumflex artery. There is dye staining in the occluded segment suggesting sub-acute lesion. The distal segment and OM branch fills from left to left collaterals slowly.  4. Successful PTCA/DES x 1 mid Circumflex artery  Recommendations: Will continue DAPT with ASA and Brilinta. She does not have a true ASA allergy. High dose statin. Continue beta blocker.       Laboratory Data:  High Sensitivity Troponin:   Recent Labs  Lab 08/30/20 1607 08/30/20 1819  TROPONINIHS 3 3     Chemistry Recent Labs  Lab 08/30/20 1607 08/31/20 0335  NA 137 141  K 3.7 4.1  CL 103 107  CO2 24 25  GLUCOSE 108* 118*  BUN 19 18  CREATININE 1.51* 1.48*  CALCIUM 9.3 9.3  GFRNONAA 36* 36*  ANIONGAP 10 9    No results for input(s): PROT, ALBUMIN, AST, ALT, ALKPHOS, BILITOT in the last 168 hours. Hematology Recent Labs  Lab 08/30/20 1607 08/31/20 0335  WBC 7.5 8.3  RBC 4.60 4.47  HGB 13.9 13.3  HCT 42.0 41.2  MCV 91.3 92.2  MCH 30.2 29.8  MCHC 33.1 32.3  RDW 15.7* 15.7*  PLT 223 218   BNPNo results for input(s): BNP, PROBNP in the last 168 hours.  DDimer No results for input(s): DDIMER in the last 168 hours.   Radiology/Studies:  DG Chest 2 View  Result Date: 08/30/2020 CLINICAL DATA:  Chest pain.  Shortness of breath. EXAM: CHEST - 2 VIEW COMPARISON:  July 07, 2020 FINDINGS: There is scattered bilateral linear airspace opacities, not substantially changed from prior study. There is no pneumothorax. No large pleural effusion. The heart size is stable. IMPRESSION: No acute cardiopulmonary process. Electronically Signed    By: Constance Holster M.D.   On: 08/30/2020 16:46     Assessment and Plan:   1. Chest pain in the setting of known CAD as above - troponins negative x2, EKG without acute changes aside from nonspecific TWI III  - symptoms with mixed atypical/typical features but no obvious objective evidence of ischemia - per discussion with MD, will plan Lexiscan nuclear stress test. Patient's Covid test is not back yet so we will need to obtain this before study. If there is further delay, we may need to defer test until tomorrow. Addendum: spoke with nurse who spoke with lab team. The Covid test has been batched and result won't be back until noon at the earliest but  possibly later. The nuc med team is only here through shortly thereafter so will plan on stress test in AM. Waunita Schooner, nuc med tech, will order dose for the AM - 2D echo being done at the moment - continue home ASA, Plavix, see below regarding lipids - will await MD input regarding beta blocker dosing - now on lower dose metoprolol 12.5mg  BID (from 50mg  BID) due to soft BP on admission, HR 50s-60s on telemetry this AM so continue present dose and follow  2. Essential HTN - BP variable from 105/30 50 to 155/86 - home losartan and Maxzide held on admit, home BB decreased - can tailor further therapies based on echo/nuc result  3. CKD stage III - Cr appears at baseline   4. Hyperlipidemia - LDL of 21, current regimen includes PCSK9i, Zetia and Crestor - consideration could be given to stopping Zetia  Risk Assessment/Risk Scores:     HEAR Score (for undifferentiated chest pain):  HEAR Score: 6   For questions or updates, please contact [place] Please consult [email or web address] for contact info under    Signed, Charlie Pitter, PA-C  08/31/2020 9:27 AM    Patient seen and examined.  Agree with above documentation.  Ms. Kary is a 75 year old female with a history of CAD status post DES to LCx in 2018, CKD stage III, renal cancer status post  right nephrectomy, hypertension, hyperlipidemia, RA, OSA, tobacco use who we are consulted to see by Dr. Cruzita Lederer for evaluation of chest pain.  Most recent catheterization was October 2018, which showed 100% mid LCx lesion treated with DES, 50% ramus, 20% mid RCA, 30% distal LAD, 20% proximal to mid LAD.  She reports that she began having chest pain on Friday after eating pasta.  Describes left-sided dull aching pain, 8 out of 10 in intensity, that would last for 2 to 3 minutes and resolve.  Continued throughout the night on Friday and into Saturday morning, with episodes lasting 2 to 3 minutes.  Reports felt similar to her anginal pain prior to her stent in 2018, but not as severe.  Given the symptoms, she presented to the ED yesterday.  EKG shows normal sinus rhythm, rate 61, less than 1 mm ST depression in lead III.  Labs showed BP 150/77, pulse 67, SPO2 94% on room air.  Labs notable for creatinine 1.5 (unchanged from prior), negative troponins x2.  On exam, patient is alert and oriented, regular rate and rhythm, no murmurs, crackles at right lung base, no LE edema.  Echocardiogram today shows normal biventricular function, no no significant valvular disease.  For her chest pain, description is atypical.  However she does report feels similar to anginal episodes prior to stent in 2018, but not as severe.  Her troponins are negative, but given chest pain would only last for 2-3 minutes and resolve, may not bump troponin.  Given this, and considering her history of coronary stenting, recommend further evaluation for ischemia.  Given her CKD s/p R nephrectomy, would not recommend proceeding directly to heart catheterization.  Recommend starting with Lexiscan Myoview.  If high risk findings, will discuss heart catheterization.  Otherwise, will plan medical management.  Donato Heinz, MD

## 2020-08-31 NOTE — Progress Notes (Signed)
No charge note  Patient seen and examined this morning, admitted overnight via hold, H&P reviewed and agree with the assessment and plan.  75 year old female with history of CAD with stent placement 2018, RCC status post right nephrectomy, hyperlipidemia, DM 2, obesity, diabetic neuropathy who came into the hospital with chest discomfort.  She describes the discomfort as being pressure-like quality, retrosternal, and reminds her of her prior chest pain in 2018 when she had to have the stent.  She has been chest pain-free since her prior stent.  EKG is nonischemic, troponin negative, but was admitted to the hospital given high risk history  Chest discomfort the setting of known CAD with PCI in 2018-no pain this morning, cardiology consulted, appreciate input.  2D echo pending  Chronic kidney disease stage IIIb with single kidney, history of right nephrectomy -Creatinine at baseline  Chronic diastolic CHF -Slight lower extremity swelling but this is chronic for her  Hyperlipidemia -continue statin  Polyneuropathy -Continue gabapentin  Scheduled Meds: . aspirin  81 mg Oral Daily  . clopidogrel  75 mg Oral Daily  . DULoxetine  30 mg Oral Daily  . ezetimibe  10 mg Oral Daily  . gabapentin  100 mg Oral TID  . metoprolol tartrate  12.5 mg Oral BID  . mirabegron ER  25 mg Oral Daily  . pantoprazole  40 mg Oral Daily  . polyethylene glycol  17 g Oral Daily  . rosuvastatin  20 mg Oral Daily   Continuous Infusions: . sodium chloride 30 mL/hr (08/31/20 0626)  . magnesium sulfate bolus IVPB     PRN Meds:.albuterol, fluticasone, HYDROmorphone (DILAUDID) injection  Zailen Albarran M. Cruzita Lederer, MD, PhD [facility]  Between 7 am - 7 pm you can contact me via [place] or [place].  I am not available 7 pm - 7 am, please contact night coverage MD/APP via Amion

## 2020-09-01 ENCOUNTER — Telehealth: Payer: Self-pay | Admitting: Family Medicine

## 2020-09-01 ENCOUNTER — Observation Stay (HOSPITAL_BASED_OUTPATIENT_CLINIC_OR_DEPARTMENT_OTHER): Payer: Medicare HMO

## 2020-09-01 DIAGNOSIS — Z8249 Family history of ischemic heart disease and other diseases of the circulatory system: Secondary | ICD-10-CM | POA: Diagnosis not present

## 2020-09-01 DIAGNOSIS — R079 Chest pain, unspecified: Secondary | ICD-10-CM

## 2020-09-01 DIAGNOSIS — R072 Precordial pain: Secondary | ICD-10-CM

## 2020-09-01 DIAGNOSIS — Z955 Presence of coronary angioplasty implant and graft: Secondary | ICD-10-CM | POA: Diagnosis not present

## 2020-09-01 LAB — NM MYOCAR MULTI W/SPECT W/WALL MOTION / EF
Estimated workload: 1 METS
Exercise duration (min): 5 min
Exercise duration (sec): 20 s
MPHR: 144 {beats}/min
Peak HR: 90 {beats}/min
Percent HR: 62 %
Rest HR: 60 {beats}/min

## 2020-09-01 LAB — BASIC METABOLIC PANEL
Anion gap: 11 (ref 5–15)
BUN: 17 mg/dL (ref 8–23)
CO2: 23 mmol/L (ref 22–32)
Calcium: 9.6 mg/dL (ref 8.9–10.3)
Chloride: 105 mmol/L (ref 98–111)
Creatinine, Ser: 1.34 mg/dL — ABNORMAL HIGH (ref 0.44–1.00)
GFR, Estimated: 41 mL/min — ABNORMAL LOW (ref >60)
Glucose, Bld: 95 mg/dL (ref 70–99)
Potassium: 3.7 mmol/L (ref 3.5–5.1)
Sodium: 139 mmol/L (ref 135–145)

## 2020-09-01 LAB — CBC
HCT: 41.8 % (ref 36.0–46.0)
Hemoglobin: 13.7 g/dL (ref 12.0–15.0)
MCH: 29.8 pg (ref 26.0–34.0)
MCHC: 32.8 g/dL (ref 30.0–36.0)
MCV: 91.1 fL (ref 80.0–100.0)
Platelets: 216 10*3/uL (ref 150–400)
RBC: 4.59 MIL/uL (ref 3.87–5.11)
RDW: 15.2 % (ref 11.5–15.5)
WBC: 8.4 10*3/uL (ref 4.0–10.5)
nRBC: 0 % (ref 0.0–0.2)

## 2020-09-01 LAB — MAGNESIUM: Magnesium: 1.8 mg/dL (ref 1.7–2.4)

## 2020-09-01 MED ORDER — TECHNETIUM TC 99M TETROFOSMIN IV KIT
30.7000 | PACK | Freq: Once | INTRAVENOUS | Status: AC | PRN
  Administered 2020-09-01: 30.7 via INTRAVENOUS

## 2020-09-01 MED ORDER — REGADENOSON 0.4 MG/5ML IV SOLN
INTRAVENOUS | Status: AC
  Administered 2020-09-01: 0.4 mg via INTRAVENOUS
  Filled 2020-09-01: qty 5

## 2020-09-01 MED ORDER — REGADENOSON 0.4 MG/5ML IV SOLN
0.4000 mg | Freq: Once | INTRAVENOUS | Status: AC
  Filled 2020-09-01: qty 5

## 2020-09-01 MED ORDER — PANTOPRAZOLE SODIUM 40 MG PO TBEC: 40.0000 mg | DELAYED_RELEASE_TABLET | Freq: Every day | ORAL | 1 refills | Status: DC

## 2020-09-01 MED ORDER — TECHNETIUM TC 99M TETROFOSMIN IV KIT
9.5000 | PACK | Freq: Once | INTRAVENOUS | Status: AC | PRN
  Administered 2020-09-01: 9.5 via INTRAVENOUS

## 2020-09-01 NOTE — Discharge Instructions (Signed)

## 2020-09-01 NOTE — Progress Notes (Signed)
Progress Note  Patient Name: [name] Date of Encounter: 09/01/2020  [name] Cardiologist: Dorris Carnes, MD   Subjective   No CP or dyspnea  Inpatient Medications    Scheduled Meds: . aspirin  81 mg Oral Daily  . clopidogrel  75 mg Oral Daily  . DULoxetine  30 mg Oral Daily  . ezetimibe  10 mg Oral Daily  . gabapentin  100 mg Oral BID  . metoprolol tartrate  12.5 mg Oral BID  . mirabegron ER  25 mg Oral Daily  . pantoprazole  40 mg Oral Daily  . polyethylene glycol  17 g Oral Daily  . rosuvastatin  20 mg Oral Daily   Continuous Infusions:  PRN Meds: albuterol, fluticasone, HYDROmorphone (DILAUDID) injection   Vital Signs    Vitals:   08/31/20 0859 08/31/20 1643 08/31/20 2021 09/01/20 0356  BP: (!) 155/86 120/70 139/73 126/71  Pulse: 78 64 72 70  Resp: 18 18 18 16   Temp: 98.2 F (36.8 C) 97.8 F (36.6 C) 98.8 F (37.1 C) 97.6 F (36.4 C)  TempSrc: Oral Oral Oral Oral  SpO2: 95% 95% 96% 98%  Weight:    103.8 kg  Height:       No intake or output data in the 24 hours ending 09/01/20 0742 Last 3 Weights 09/01/2020 08/31/2020 08/30/2020  Weight (lbs) 228 lb 12.8 oz 231 lb 215 lb  Weight (kg) 103.783 kg 104.781 kg 97.523 kg      Telemetry    Sinus with PVCs - Personally Reviewed   Physical Exam   GEN: No acute distress.   Neck: No JVD Cardiac: RRR, no murmurs, rubs, or gallops.  Respiratory: Clear to auscultation bilaterally. GI: Soft, nontender, non-distended  MS: No edema Neuro:  Nonfocal  Psych: Normal affect   Labs    High Sensitivity Troponin:   Recent Labs  Lab 08/30/20 1607 08/30/20 1819  TROPONINIHS 3 3      Chemistry Recent Labs  Lab 08/30/20 1607 08/31/20 0335 09/01/20 0204  NA 137 141 139  K 3.7 4.1 3.7  CL 103 107 105  CO2 24 25 23   GLUCOSE 108* 118* 95  BUN 19 18 17   CREATININE 1.51* 1.48* 1.34*  CALCIUM 9.3 9.3 9.6  GFRNONAA 36* 36* 41*  ANIONGAP 10 9 11      Hematology Recent Labs  Lab 08/30/20 1607  08/31/20 0335 09/01/20 0204  WBC 7.5 8.3 8.4  RBC 4.60 4.47 4.59  HGB 13.9 13.3 13.7  HCT 42.0 41.2 41.8  MCV 91.3 92.2 91.1  MCH 30.2 29.8 29.8  MCHC 33.1 32.3 32.8  RDW 15.7* 15.7* 15.2  PLT 223 218 216    Radiology    DG Chest 2 View  Result Date: 08/30/2020 CLINICAL DATA:  Chest pain.  Shortness of breath. EXAM: CHEST - 2 VIEW COMPARISON:  July 07, 2020 FINDINGS: There is scattered bilateral linear airspace opacities, not substantially changed from prior study. There is no pneumothorax. No large pleural effusion. The heart size is stable. IMPRESSION: No acute cardiopulmonary process. Electronically Signed   By: Constance Holster M.D.   On: 08/30/2020 16:46   ECHOCARDIOGRAM COMPLETE  Result Date: 08/31/2020    ECHOCARDIOGRAM REPORT   Patient Name:   [name] [facility] Date of Exam: 08/31/2020 Medical Rec #:  [id number]   Height:       66.0 in Accession #:    [id number]  Weight:       231.0 lb Date of Birth:  01-Jan-1946   BSA:          2.126 m Patient Age:    75 years    BP:           155/86 mmHg Patient Gender: F           HR:           65 bpm. Exam Location:  Inpatient Procedure: 2D Echo, Cardiac Doppler and Color Doppler Indications:    R07.9* Chest pain, unspecified  History:        Patient has prior history of Echocardiogram examinations, most                 recent 05/27/2017. CAD; Risk Factors:Hypertension, Diabetes,                 Dyslipidemia, Current Smoker and GERD. Cancer. CKD. Thyroid                 Disease.  Sonographer:    Jonelle Sidle Dance Referring Phys: [id number] [place]  1. Left ventricular ejection fraction, by estimation, is 60 to 65%. The left ventricle has normal function. The left ventricle has no regional wall motion abnormalities. Left ventricular diastolic parameters are consistent with Grade I diastolic dysfunction (impaired relaxation).  2. Right ventricular systolic function is normal. The right ventricular size is normal.  3. The mitral valve is normal  in structure. No evidence of mitral valve regurgitation. No evidence of mitral stenosis.  4. The aortic valve is normal in structure. Aortic valve regurgitation is not visualized. No aortic stenosis is present.  5. The inferior vena cava is normal in size with greater than 50% respiratory variability, suggesting right atrial pressure of 3 mmHg. FINDINGS  Left Ventricle: Left ventricular ejection fraction, by estimation, is 60 to 65%. The left ventricle has normal function. The left ventricle has no regional wall motion abnormalities. The left ventricular internal cavity size was normal in size. There is  no left ventricular hypertrophy. Left ventricular diastolic parameters are consistent with Grade I diastolic dysfunction (impaired relaxation). Right Ventricle: The right ventricular size is normal. No increase in right ventricular wall thickness. Right ventricular systolic function is normal. Left Atrium: Left atrial size was normal in size. Right Atrium: Right atrial size was normal in size. Pericardium: There is no evidence of pericardial effusion. Mitral Valve: The mitral valve is normal in structure. No evidence of mitral valve regurgitation. No evidence of mitral valve stenosis. Tricuspid Valve: The tricuspid valve is normal in structure. Tricuspid valve regurgitation is not demonstrated. No evidence of tricuspid stenosis. Aortic Valve: The aortic valve is normal in structure. Aortic valve regurgitation is not visualized. No aortic stenosis is present. Pulmonic Valve: The pulmonic valve was normal in structure. Pulmonic valve regurgitation is trivial. No evidence of pulmonic stenosis. Aorta: The aortic root is normal in size and structure. Venous: The inferior vena cava is normal in size with greater than 50% respiratory variability, suggesting right atrial pressure of 3 mmHg. IAS/Shunts: No atrial level shunt detected by color flow Doppler.  LEFT VENTRICLE PLAX 2D LVIDd:         4.30 cm  Diastology LVIDs:          2.90 cm  LV e' medial:    5.66 cm/s LV PW:         1.03 cm  LV E/e' medial:  14.7 LV IVS:        1.10 cm  LV e' lateral:   5.44 cm/s  LVOT diam:     1.80 cm  LV E/e' lateral: 15.3 LV SV:         69 LV SV Index:   33 LVOT Area:     2.54 cm  RIGHT VENTRICLE             IVC RV Basal diam:  2.80 cm     IVC diam: 1.80 cm RV S prime:     14.60 cm/s TAPSE (M-mode): 2.3 cm LEFT ATRIUM             Index       RIGHT ATRIUM           Index LA diam:        4.80 cm 2.26 cm/m  RA Area:     15.70 cm LA Vol (A2C):   58.9 ml 27.70 ml/m RA Volume:   37.70 ml  17.73 ml/m LA Vol (A4C):   47.5 ml 22.34 ml/m LA Biplane Vol: 58.1 ml 27.33 ml/m  AORTIC VALVE LVOT Vmax:   98.30 cm/s LVOT Vmean:  73.900 cm/s LVOT VTI:    0.272 m  AORTA Ao Root diam: 2.70 cm Ao Asc diam:  3.30 cm MITRAL VALVE MV Area (PHT): 2.24 cm     SHUNTS MV Decel Time: 338 msec     Systemic VTI:  0.27 m MV E velocity: 83.10 cm/s   Systemic Diam: 1.80 cm MV A velocity: 121.00 cm/s MV E/A ratio:  0.69 Candee Furbish MD Electronically signed by Candee Furbish MD Signature Date/Time: 08/31/2020/11:12:17 AM    Final     Patient Profile     75 y.o. female with past medical history of coronary artery disease status post PCI of left circumflex, chronic stage III kidney disease, previous right nephrectomy secondary to renal cancer, hypertension, hyperlipidemia, rheumatoid arthritis, obstructive sleep apnea, tobacco abuse with chest pain.  Echocardiogram shows normal LV function.  Assessment & Plan    1 chest pain-troponins are normal. Echo shows normal LV function. Plan is for [place] nuclear study today.  If normal or low risk plan medical therapy.  2 coronary artery disease-plan to continue aspirin, Plavix and statin.  3 hyperlipidemia-continue statin.  4 hypertension-blood pressure controlled.  Continue present medications and follow.  5 chronic stage III kidney disease  If nuclear study normal or low risk patient can be discharged and follow-up with  Dr. Harrington Challenger.  For questions or updates, please contact [place] Please consult [email or web address] for contact info under        Signed, Kirk Ruths, MD  09/01/2020, 7:42 AM

## 2020-09-01 NOTE — Telephone Encounter (Signed)
Patient's daughter Mackie Pai called to advise Dr. [name] that her mother has been hospitalized.  States she is having some issues with her heart.   She was scheduled to see Dr. [name] on 09/03/20 but cancelled and she will call back to r/s.  Just an FYI.

## 2020-09-01 NOTE — Telephone Encounter (Signed)
Noted  

## 2020-09-01 NOTE — Discharge Summary (Addendum)
Physician Discharge Summary  [name] DOB: 07/07/1946 DOA: 08/30/2020  PCP: [name], [name], MD  Admit date: 08/30/2020 Discharge date: 09/01/2020    Admitted From: Home Disposition: Home  Recommendations for Outpatient Follow-up:  1. Follow up with PCP in 1-2 weeks 2. Please obtain BMP/CBC in one week 3. Please follow up with your PCP on the following pending results: Unresulted Labs (From admission, onward)         None        Home Health: None Equipment/Devices: None  Discharge Condition: Stable CODE STATUS: Full code Diet recommendation: Cardiac  Subjective: Seen and examined after the stress test.  She has no complaints.  No chest pain.  Wants to go home.  HPI: [name] is a 75 y.o. female with medical history significant for coronary artery disease status post PCI with stent placement in 2018, renal cell carcinoma post right nephrectomy, hyperlipidemia, type 2 diabetes, GERD, CKD 3B, rheumatoid arthritis, obesity, diabetic neuropathy, who presented to [place] emergency department with complaints of chest discomfort.  Onset Friday with exertion while helping her son move out of her house, they ate a Tiu meal so she thought it was related to her acid reflux.  The following morning her left sided chest pain woke her out of her sleep.  8/10, pressure-like in quality, radiating down the left arm, worse with exertion.  The pain is similar but less intense than prior chest pain that led to PCI with stent.  She presented to the ED for further evaluation.  Reports compliance with her medications including aspirin Plavix and Crestor.  In the ED 2 sets of troponin were unremarkable, EKG revealed no evidence of acute ischemia, chest x-ray was unremarkable. Due to heart score of 4, EDP requested admission for observation and further management. Cardiology will need to be consulted in the morning. TRH are asked to admit.  ED Course: Afebrile. Vital signs  within normal limits. Lab studies remarkable for negative troponin x2, creatinine 1.51 with baseline 1.4, CBC was unremarkable.   Brief/Interim Summary: Patient was admitted with atypical chest pain.  Cardiac enzymes x2 were negative.  Echo showed normal ejection fraction and grade 1 diastolic dysfunction with no wall motion abnormality.  Patient underwent nuclear stress test which was low risk study as well and she was cleared by cardiology.  She is going to be discharged home in stable condition and currently she has no symptoms.  No changes in medication except that omeprazole is being switched to pantoprazole due to interaction of omeprazole with Plavix.  And cardiology will follow up with her as outpatient.  Discharge Diagnoses:  Active Problems:   Chest pain    Discharge Instructions   Allergies as of 09/01/2020      Reactions   Oxycodone-acetaminophen Hives, Itching   hallucinations   Percocet [oxycodone-acetaminophen] Hives, Itching, Other (See Comments)   hallucinations   Pravastatin Sodium Other (See Comments)   Muscle aches Muscle aches   Hydrocodone Other (See Comments)   "crazy dreams"   Aspirin Other (See Comments)   GI upset Other reaction(s): Other (See Comments) REACTION: GI upset      Medication List    TAKE these medications   acetaminophen 500 MG tablet Commonly known as: TYLENOL Take 1,000 mg by mouth as needed for moderate pain or headache.   albuterol 108 (90 Base) MCG/ACT inhaler Commonly known as: VENTOLIN HFA INHALE ONE OR TWO PUFFS BY MOUTH INTO THE LUNGS EVERY SIX  HOURS AS NEEDED FOR WHEEZING OR SHORTNESS OF BREATH What changed: See the new instructions.   aspirin 81 MG chewable tablet Chew 81 mg by mouth daily.   clopidogrel 75 MG tablet Commonly known as: PLAVIX Take 75 mg by mouth daily.   colchicine 0.6 MG tablet Take 0.5 tablets (0.3 mg total) by mouth daily.   DULoxetine 30 MG capsule Commonly known as: CYMBALTA Take 1 capsule  (30 mg total) by mouth daily.   ezetimibe 10 MG tablet Commonly known as: ZETIA Take 1 tablet (10 mg total) by mouth daily.   fluticasone 50 MCG/ACT nasal spray Commonly known as: FLONASE PLACE 2 SPRAYS INTO BOTH NOSTRILS AT BEDTIME AS NEEDED FOR ALLERGIES.   gabapentin 400 MG capsule Commonly known as: NEURONTIN Take 1 capsule (400 mg total) by mouth 2 (two) times daily.   gabapentin 100 MG capsule Commonly known as: NEURONTIN Take 1 capsule (100 mg total) by mouth 3 (three) times daily.   losartan 100 MG tablet Commonly known as: COZAAR TAKE ONE TABLET BY MOUTH ONE TIME DAILY What changed: how much to take   metoprolol tartrate 50 MG tablet Commonly known as: LOPRESSOR TAKE ONE TABLET BY MOUTH TWICE DAILY   Myrbetriq 25 MG Tb24 tablet Generic drug: mirabegron ER Take 25 mg by mouth daily.   nitroGLYCERIN 0.4 MG SL tablet Commonly known as: NITROSTAT Place 1 tablet (0.4 mg total) under the tongue every 5 (five) minutes as needed for chest pain.   omeprazole 20 MG capsule Commonly known as: PRILOSEC Take 1 capsule (20 mg total) by mouth 2 (two) times daily before a meal.   polyethylene glycol powder 17 GM/SCOOP powder Commonly known as: GLYCOLAX/MIRALAX Take 17 g by mouth daily. What changed: when to take this   Praluent 75 MG/ML Soaj Generic drug: Alirocumab Inject 75 mg into the skin every 14 (fourteen) days.   rosuvastatin 20 MG tablet Commonly known as: CRESTOR Take 1 tablet (20 mg total) by mouth daily.   triamterene-hydrochlorothiazide 37.5-25 MG tablet Commonly known as: MAXZIDE-25 TAKE HALF TABLET BY MOUTH DAILY What changed: when to take this       Follow-up Information    [name], [name], MD Follow up in 1 week(s).   Specialty: Family Medicine Contact information: [place] [address] [phone]        Fay Records, MD .   Specialty: Cardiology Contact information: [address]   [place] [address] [phone]              Allergies  Allergen Reactions  . Oxycodone-Acetaminophen Hives and Itching    hallucinations  . Percocet [Oxycodone-Acetaminophen] Hives, Itching and Other (See Comments)    hallucinations  . Pravastatin Sodium Other (See Comments)    Muscle aches Muscle aches  . Hydrocodone Other (See Comments)    "crazy dreams"  . Aspirin Other (See Comments)    GI upset Other reaction(s): Other (See Comments) REACTION: GI upset    Consultations: Cardiology   Procedures/Studies: DG Chest 2 View  Result Date: 08/30/2020 CLINICAL DATA:  Chest pain.  Shortness of breath. EXAM: CHEST - 2 VIEW COMPARISON:  July 07, 2020 FINDINGS: There is scattered bilateral linear airspace opacities, not substantially changed from prior study. There is no pneumothorax. No large pleural effusion. The heart size is stable. IMPRESSION: No acute cardiopulmonary process. Electronically Signed   By: Constance Holster M.D.   On: 08/30/2020 16:46   NM Myocar Multi W/Spect W/Wall Motion /  EF  Result Date: 09/01/2020 CLINICAL DATA:  Family history with prior cardiac stents in 2018. Chest pain. EXAM: MYOCARDIAL IMAGING WITH SPECT (REST AND PHARMACOLOGIC-STRESS) GATED LEFT VENTRICULAR WALL MOTION STUDY LEFT VENTRICULAR EJECTION FRACTION TECHNIQUE: Standard myocardial SPECT imaging was performed after resting intravenous injection of 9.5 mCi Tc-6m tetrofosmin. Subsequently, intravenous infusion of Lexiscan was performed under the supervision of the Cardiology staff. At peak effect of the drug, 30.7 mCi Tc-62m tetrofosmin was injected intravenously and standard myocardial SPECT imaging was performed. Quantitative gated imaging was also performed to evaluate left ventricular wall motion, and estimate left ventricular ejection fraction. COMPARISON:  Chest radiograph 08/30/2020 FINDINGS: Perfusion: Suspected mild fixed defect involving the mid to basilar segment of the lateral wall. No areas  of reversibility to suggest inducible ischemia. Wall Motion: No left ventricular dilatation. Hypokinesis involving the lateral and septal walls. Left Ventricular Ejection Fraction: 61 % End diastolic volume 78 ml End systolic volume 30 ml IMPRESSION: 1. No reversibility to suggest inducible ischemia. Possible mild fixed defect involving the mid to basilar segment of the lateral wall. 2. Hypokinesis involving the lateral and septal walls. 3. Left ventricular ejection fraction 61% 4. Non invasive risk stratification*: Low *2012 Appropriate Use Criteria for Coronary Revascularization Focused Update: J Am Coll Cardiol. [id number]. http://content.airportbarriers.com.aspx?articleid=[email or web address] Electronically Signed   By: Abigail Miyamoto M.D.   On: 09/01/2020 14:49   ECHOCARDIOGRAM COMPLETE  Result Date: 08/31/2020    ECHOCARDIOGRAM REPORT   Patient Name:   LATEKA TEODOSIO [employer] Date of Exam: 08/31/2020 Medical Rec #:  [id number]   Height:       66.0 in Accession #:    [id number]  Weight:       231.0 lb Date of Birth:  1946/03/13   BSA:          2.126 m Patient Age:    52 years    BP:           155/86 mmHg Patient Gender: F           HR:           65 bpm. Exam Location:  Inpatient Procedure: 2D Echo, Cardiac Doppler and Color Doppler Indications:    R07.9* Chest pain, unspecified  History:        Patient has prior history of Echocardiogram examinations, most                 recent 05/27/2017. CAD; Risk Factors:Hypertension, Diabetes,                 Dyslipidemia, Current Smoker and GERD. Cancer. CKD. Thyroid                 Disease.  Sonographer:    Jonelle Sidle Dance Referring Phys: [id number] [place]  1. Left ventricular ejection fraction, by estimation, is 60 to 65%. The left ventricle has normal function. The left ventricle has no regional wall motion abnormalities. Left ventricular diastolic parameters are consistent with Grade I diastolic dysfunction (impaired relaxation).  2. Right ventricular systolic  function is normal. The right ventricular size is normal.  3. The mitral valve is normal in structure. No evidence of mitral valve regurgitation. No evidence of mitral stenosis.  4. The aortic valve is normal in structure. Aortic valve regurgitation is not visualized. No aortic stenosis is present.  5. The inferior vena cava is normal in size with greater than 50% respiratory variability, suggesting right atrial pressure of 3 mmHg. FINDINGS  Left Ventricle: Left ventricular ejection fraction,  by estimation, is 60 to 65%. The left ventricle has normal function. The left ventricle has no regional wall motion abnormalities. The left ventricular internal cavity size was normal in size. There is  no left ventricular hypertrophy. Left ventricular diastolic parameters are consistent with Grade I diastolic dysfunction (impaired relaxation). Right Ventricle: The right ventricular size is normal. No increase in right ventricular wall thickness. Right ventricular systolic function is normal. Left Atrium: Left atrial size was normal in size. Right Atrium: Right atrial size was normal in size. Pericardium: There is no evidence of pericardial effusion. Mitral Valve: The mitral valve is normal in structure. No evidence of mitral valve regurgitation. No evidence of mitral valve stenosis. Tricuspid Valve: The tricuspid valve is normal in structure. Tricuspid valve regurgitation is not demonstrated. No evidence of tricuspid stenosis. Aortic Valve: The aortic valve is normal in structure. Aortic valve regurgitation is not visualized. No aortic stenosis is present. Pulmonic Valve: The pulmonic valve was normal in structure. Pulmonic valve regurgitation is trivial. No evidence of pulmonic stenosis. Aorta: The aortic root is normal in size and structure. Venous: The inferior vena cava is normal in size with greater than 50% respiratory variability, suggesting right atrial pressure of 3 mmHg. IAS/Shunts: No atrial level shunt detected by  color flow Doppler.  LEFT VENTRICLE PLAX 2D LVIDd:         4.30 cm  Diastology LVIDs:         2.90 cm  LV e' medial:    5.66 cm/s LV PW:         1.03 cm  LV E/e' medial:  14.7 LV IVS:        1.10 cm  LV e' lateral:   5.44 cm/s LVOT diam:     1.80 cm  LV E/e' lateral: 15.3 LV SV:         69 LV SV Index:   33 LVOT Area:     2.54 cm  RIGHT VENTRICLE             IVC RV Basal diam:  2.80 cm     IVC diam: 1.80 cm RV S prime:     14.60 cm/s TAPSE (M-mode): 2.3 cm LEFT ATRIUM             Index       RIGHT ATRIUM           Index LA diam:        4.80 cm 2.26 cm/m  RA Area:     15.70 cm LA Vol (A2C):   58.9 ml 27.70 ml/m RA Volume:   37.70 ml  17.73 ml/m LA Vol (A4C):   47.5 ml 22.34 ml/m LA Biplane Vol: 58.1 ml 27.33 ml/m  AORTIC VALVE LVOT Vmax:   98.30 cm/s LVOT Vmean:  73.900 cm/s LVOT VTI:    0.272 m  AORTA Ao Root diam: 2.70 cm Ao Asc diam:  3.30 cm MITRAL VALVE MV Area (PHT): 2.24 cm     SHUNTS MV Decel Time: 338 msec     Systemic VTI:  0.27 m MV E velocity: 83.10 cm/s   Systemic Diam: 1.80 cm MV A velocity: 121.00 cm/s MV E/A ratio:  0.69 Candee Furbish MD Electronically signed by Candee Furbish MD Signature Date/Time: 08/31/2020/11:12:17 AM    Final       Discharge Exam: Vitals:   09/01/20 1030 09/01/20 1501  BP: 140/67 125/71  Pulse:  62  Resp:  16  Temp:  98.4 F (36.9 C)  SpO2:  95%   Vitals:   09/01/20 1027 09/01/20 1028 09/01/20 1030 09/01/20 1501  BP: 140/78 (!) 203/70 140/67 125/71  Pulse:    62  Resp:    16  Temp:    98.4 F (36.9 C)  TempSrc:    Oral  SpO2:    95%  Weight:      Height:        General: Pt is alert, awake, not in acute distress Cardiovascular: RRR, S1/S2 +, no rubs, no gallops Respiratory: CTA bilaterally, no wheezing, no rhonchi Abdominal: Soft, NT, ND, bowel sounds + Extremities: no edema, no cyanosis    The results of significant diagnostics from this hospitalization (including imaging, microbiology, ancillary and laboratory) are listed below for  reference.     Microbiology: Recent Results (from the past 240 hour(s))  SARS CORONAVIRUS 2 (TAT 6-24 HRS) Nasopharyngeal Nasopharyngeal Swab     Status: None   Collection Time: 08/30/20  7:39 PM   Specimen: Nasopharyngeal Swab  Result Value Ref Range Status   SARS Coronavirus 2 NEGATIVE NEGATIVE Final    Comment: (NOTE) SARS-CoV-2 target nucleic acids are NOT DETECTED.  The SARS-CoV-2 RNA is generally detectable in upper and lower respiratory specimens during the acute phase of infection. Negative results do not preclude SARS-CoV-2 infection, do not rule out co-infections with other pathogens, and should not be used as the sole basis for treatment or other patient management decisions. Negative results must be combined with clinical observations, patient history, and epidemiological information. The expected result is Negative.  Fact Sheet for Patients: SugarRoll.be  Fact Sheet for Healthcare Providers: https://www.woods-mathews.com/  This test is not yet approved or cleared by the [place] FDA and  has been authorized for detection and/or diagnosis of SARS-CoV-2 by FDA under an Emergency Use Authorization (EUA). This EUA will remain  in effect (meaning this test can be used) for the duration of the COVID-19 declaration under Se ction 564(b)(1) of the Act, 21 U.S.C. section 360bbb-3(b)(1), unless the authorization is terminated or revoked sooner.  Performed at [facility], [place] [address], [place], [place] [address]      Labs: BNP (last 3 results) No results for input(s): BNP in the last 8760 hours. Basic Metabolic Panel: Recent Labs  Lab 08/30/20 1607 08/31/20 0335 09/01/20 0204  NA 137 141 139  K 3.7 4.1 3.7  CL 103 107 105  CO2 24 25 23   GLUCOSE 108* 118* 95  BUN 19 18 17   CREATININE 1.51* 1.48* 1.34*  CALCIUM 9.3 9.3 9.6  MG  --  1.6* 1.8   Liver Function Tests: No results for input(s): AST, ALT,  ALKPHOS, BILITOT, PROT, ALBUMIN in the last 168 hours. No results for input(s): LIPASE, AMYLASE in the last 168 hours. No results for input(s): AMMONIA in the last 168 hours. CBC: Recent Labs  Lab 08/30/20 1607 08/31/20 0335 09/01/20 0204  WBC 7.5 8.3 8.4  NEUTROABS 2.8  --   --   HGB 13.9 13.3 13.7  HCT 42.0 41.2 41.8  MCV 91.3 92.2 91.1  PLT 223 218 216   Cardiac Enzymes: No results for input(s): CKTOTAL, CKMB, CKMBINDEX, TROPONINI in the last 168 hours. BNP: Invalid input(s): POCBNP CBG: No results for input(s): GLUCAP in the last 168 hours. D-Dimer No results for input(s): DDIMER in the last 72 hours. Hgb A1c No results for input(s): HGBA1C in the last 72 hours. Lipid Profile Recent Labs    08/31/20 0335  CHOL 85  HDL  44  LDLCALC 21  TRIG 99  CHOLHDL 1.9   Thyroid function studies No results for input(s): TSH, T4TOTAL, T3FREE, THYROIDAB in the last 72 hours.  Invalid input(s): FREET3 Anemia work up No results for input(s): VITAMINB12, FOLATE, FERRITIN, TIBC, IRON, RETICCTPCT in the last 72 hours. Urinalysis    Component Value Date/Time   COLORURINE AMBER (A) 10/22/2014 1341   APPEARANCEUR HAZY (A) 10/22/2014 1341   LABSPEC 1.020 02/23/2020 1423   PHURINE 5.5 02/23/2020 1423   GLUCOSEU NEGATIVE 02/23/2020 1423   HGBUR NEGATIVE 02/23/2020 1423   HGBUR negative 11/18/2008 1032   BILIRUBINUR NEGATIVE 02/23/2020 1423   BILIRUBINUR n 06/01/2016 1041   KETONESUR NEGATIVE 02/23/2020 1423   PROTEINUR 30 (A) 02/23/2020 1423   UROBILINOGEN 0.2 02/23/2020 1423   NITRITE NEGATIVE 02/23/2020 1423   LEUKOCYTESUR NEGATIVE 02/23/2020 1423   Sepsis Labs Invalid input(s): PROCALCITONIN,  WBC,  LACTICIDVEN Microbiology Recent Results (from the past 240 hour(s))  SARS CORONAVIRUS 2 (TAT 6-24 HRS) Nasopharyngeal Nasopharyngeal Swab     Status: None   Collection Time: 08/30/20  7:39 PM   Specimen: Nasopharyngeal Swab  Result Value Ref Range Status   SARS Coronavirus 2  NEGATIVE NEGATIVE Final    Comment: (NOTE) SARS-CoV-2 target nucleic acids are NOT DETECTED.  The SARS-CoV-2 RNA is generally detectable in upper and lower respiratory specimens during the acute phase of infection. Negative results do not preclude SARS-CoV-2 infection, do not rule out co-infections with other pathogens, and should not be used as the sole basis for treatment or other patient management decisions. Negative results must be combined with clinical observations, patient history, and epidemiological information. The expected result is Negative.  Fact Sheet for Patients: SugarRoll.be  Fact Sheet for Healthcare Providers: https://www.woods-mathews.com/  This test is not yet approved or cleared by the [place] FDA and  has been authorized for detection and/or diagnosis of SARS-CoV-2 by FDA under an Emergency Use Authorization (EUA). This EUA will remain  in effect (meaning this test can be used) for the duration of the COVID-19 declaration under Se ction 564(b)(1) of the Act, 21 U.S.C. section 360bbb-3(b)(1), unless the authorization is terminated or revoked sooner.  Performed at [facility], [place] [address], [place], [place] [address]      Time coordinating discharge: Over 30 minutes  SIGNED:   Darliss Cheney, MD  [facility] 09/01/2020, 3:45 PM  If 7PM-7AM, please contact night-coverage [email or web address]

## 2020-09-01 NOTE — Plan of Care (Signed)
  Problem: Clinical Measurements: Goal: Ability to maintain clinical measurements within normal limits will improve Outcome: Progressing   Problem: Clinical Measurements: Goal: Will remain free from infection Outcome: Progressing   

## 2020-09-01 NOTE — Progress Notes (Signed)
   [name] presented for a nuclear stress test today.  No immediate complications.  Stress imaging is pending at this time.  Preliminary EKG findings may be listed in the chart, but the stress test result will not be finalized until perfusion imaging is complete.   Kathyrn Drown, NP-C 09/01/2020, 10:38 AM

## 2020-09-03 ENCOUNTER — Ambulatory Visit: Payer: Medicare HMO | Admitting: Family Medicine

## 2020-09-04 ENCOUNTER — Encounter (HOSPITAL_BASED_OUTPATIENT_CLINIC_OR_DEPARTMENT_OTHER): Payer: Medicare HMO | Admitting: Internal Medicine

## 2020-09-09 ENCOUNTER — Ambulatory Visit (INDEPENDENT_AMBULATORY_CARE_PROVIDER_SITE_OTHER): Payer: Medicare HMO | Admitting: Podiatry

## 2020-09-09 ENCOUNTER — Other Ambulatory Visit: Payer: Self-pay

## 2020-09-09 DIAGNOSIS — M7750 Other enthesopathy of unspecified foot: Secondary | ICD-10-CM

## 2020-09-09 DIAGNOSIS — M79672 Pain in left foot: Secondary | ICD-10-CM

## 2020-09-09 DIAGNOSIS — M7752 Other enthesopathy of left foot: Secondary | ICD-10-CM

## 2020-09-09 DIAGNOSIS — M779 Enthesopathy, unspecified: Secondary | ICD-10-CM

## 2020-09-09 MED ORDER — TRIAMCINOLONE ACETONIDE 10 MG/ML IJ SUSP
10.0000 mg | Freq: Once | INTRAMUSCULAR | Status: AC
  Administered 2020-09-09: 10 mg

## 2020-09-11 ENCOUNTER — Other Ambulatory Visit: Payer: Self-pay

## 2020-09-11 ENCOUNTER — Other Ambulatory Visit: Payer: Medicare HMO

## 2020-09-11 DIAGNOSIS — E785 Hyperlipidemia, unspecified: Secondary | ICD-10-CM | POA: Diagnosis not present

## 2020-09-11 LAB — HEPATIC FUNCTION PANEL
ALT: 14 IU/L (ref 0–32)
AST: 23 IU/L (ref 0–40)
Albumin: 4.3 g/dL (ref 3.7–4.7)
Alkaline Phosphatase: 71 IU/L (ref 44–121)
Bilirubin Total: 0.4 mg/dL (ref 0.0–1.2)
Bilirubin, Direct: 0.13 mg/dL (ref 0.00–0.40)
Total Protein: 7.8 g/dL (ref 6.0–8.5)

## 2020-09-11 LAB — LIPID PANEL
Chol/HDL Ratio: 2.6 ratio (ref 0.0–4.4)
Cholesterol, Total: 143 mg/dL (ref 100–199)
HDL: 56 mg/dL (ref >39)
LDL Chol Calc (NIH): 69 mg/dL (ref 0–99)
Triglycerides: 98 mg/dL (ref 0–149)
VLDL Cholesterol Cal: 18 mg/dL (ref 5–40)

## 2020-09-15 ENCOUNTER — Other Ambulatory Visit: Payer: Self-pay

## 2020-09-15 ENCOUNTER — Ambulatory Visit (INDEPENDENT_AMBULATORY_CARE_PROVIDER_SITE_OTHER): Payer: Medicare HMO | Admitting: Family Medicine

## 2020-09-15 ENCOUNTER — Encounter: Payer: Self-pay | Admitting: Family Medicine

## 2020-09-15 VITALS — BP 118/70 | HR 97 | Resp 16 | Ht 66.0 in | Wt 235.0 lb

## 2020-09-15 DIAGNOSIS — M79604 Pain in right leg: Secondary | ICD-10-CM

## 2020-09-15 DIAGNOSIS — M545 Low back pain, unspecified: Secondary | ICD-10-CM | POA: Diagnosis not present

## 2020-09-15 DIAGNOSIS — I2510 Atherosclerotic heart disease of native coronary artery without angina pectoris: Secondary | ICD-10-CM | POA: Diagnosis not present

## 2020-09-15 DIAGNOSIS — K219 Gastro-esophageal reflux disease without esophagitis: Secondary | ICD-10-CM | POA: Diagnosis not present

## 2020-09-15 DIAGNOSIS — I119 Hypertensive heart disease without heart failure: Secondary | ICD-10-CM

## 2020-09-15 MED ORDER — TIZANIDINE HCL 4 MG PO TABS: 4.0000 mg | ORAL_TABLET | Freq: Every day | ORAL | 0 refills | Status: DC

## 2020-09-15 MED ORDER — PREDNISONE 20 MG PO TABS: ORAL_TABLET | ORAL | 0 refills | Status: DC

## 2020-09-15 MED ORDER — TRAMADOL HCL 50 MG PO TABS: 50.0000 mg | ORAL_TABLET | Freq: Two times a day (BID) | ORAL | 0 refills | Status: AC | PRN

## 2020-09-15 NOTE — Progress Notes (Signed)
Chief Complaint  Patient presents with  . Hip Pain    Right side, started last Thursday, is going down leg now. Pain is constant   . Hospitalization Follow-up   HPI: [name] is a 75 y.o. female, who is here today with above complaint. Woke up with right buttock pain Thursday, improved,she thought she slept "wrong." Friday she had pain again and Saturday pain was worse, radiated to [place]. Pain is sharp,constant now with periods of exacerbation,10/10. Exacerbated by movement,walking,standing. Alleviated by rest. Pain is mildly better today.  She has not noted edema or erythema. No hx of trauma.  No associated LE numbness/ tingling,saddle anesthesia,or changes in bowel/bladder function. Negative for fever,chills,or changes in appetite.  Hospitalized from 08/30/20 to 09/01/20. She presented to the ER c/o CP,pressure like, 8/10, radiated to LUE. She was having CP with exertion. In the ER troponin x 2 negative.  Lab Results  Component Value Date   CREATININE 1.34 (H) 09/01/2020   BUN 17 09/01/2020   NA 139 09/01/2020   K 3.7 09/01/2020   CL 105 09/01/2020   CO2 23 09/01/2020   Lab Results  Component Value Date   WBC 8.4 09/01/2020   HGB 13.7 09/01/2020   HCT 41.8 09/01/2020   MCV 91.1 09/01/2020   PLT 216 09/01/2020   CXR negative for acute cardiopulmonary process.  Cardiac work up negative and CP was attributed to GERD and "stress." Echo on 08/31/20: LVEF 81-85% and diastolic dysfunction grade I. She is not having orthopnea or PND. She has not had CP since hospital discharge. MYOCARDIAL IMAGING WITH SPECT (REST AND PHARMACOLOGIC-STRESS) 08/31/20.  1. No reversibility to suggest inducible ischemia. Possible mild fixed defect involving the mid to basilar segment of the lateral wall. 2. Hypokinesis involving the lateral and septal walls. 3. Left ventricular ejection fraction 61% 4. Non invasive risk stratification*: Low  Negative for palpitations or  SOB.  Cardiac cath 05/2017:  1. Mild non-obstructive disease in the LAD and RCA 2. Moderate stenosis in the distal segment of the moderate caliber intermediate branch 3. Chronic total occlusion of the mid Circumflex artery. There is dye staining in the occluded segment suggesting sub-acute lesion. The distal segment and OM branch fills from left to left collaterals slowly.  4. Successful PTCA/DES x 1 mid Circumflex artery  She is on Plavix 75 mg daily,Aspirin 81 mg daily,Zetia 10 mg daily, and Crestor 20 mg daily. She is on [name] 37.5-25 mg 1/2 tab bid and Losartan 100 mg daily for HTN.  She has an appt with her cardiologist next week.  + Smoker, down to 2 cig/day.  Her son is moving to his own place, she is excited for him. Her daughter will stay with her.  She is on Protonix 40 mg daily. She is not having heartburn.  Review of Systems  Constitutional: Positive for activity change. Negative for fatigue.  HENT: Negative for sore throat and trouble swallowing.   Eyes: Negative for redness and visual disturbance.  Respiratory: Negative for cough and wheezing.   Gastrointestinal: Negative for abdominal pain, nausea and vomiting.       Negative for changes in bowel habits.  Genitourinary: Negative for decreased urine volume, dysuria and hematuria.  Neurological: Negative for syncope, weakness and headaches.  Rest see pertinent positives and negatives per HPI.  Current Outpatient Medications on File Prior to Visit  Medication Sig Dispense Refill  . acetaminophen (TYLENOL) 500 MG tablet Take 1,000 mg by mouth as needed for moderate  pain or headache.     . albuterol (VENTOLIN HFA) 108 (90 Base) MCG/ACT inhaler INHALE ONE OR TWO PUFFS BY MOUTH INTO THE LUNGS EVERY SIX HOURS AS NEEDED FOR WHEEZING OR SHORTNESS OF BREATH (Patient taking differently: Inhale 2 puffs into the lungs every 6 (six) hours as needed for wheezing or shortness of breath.) 8.5 g 0  . Alirocumab (PRALUENT) 75  MG/ML SOAJ Inject 75 mg into the skin every 14 (fourteen) days. 2 mL 11  . aspirin 81 MG chewable tablet Chew 81 mg by mouth daily.    . clopidogrel (PLAVIX) 75 MG tablet Take 75 mg by mouth daily.    . colchicine 0.6 MG tablet Take 0.5 tablets (0.3 mg total) by mouth daily. 45 tablet 0  . DULoxetine (CYMBALTA) 30 MG capsule Take 1 capsule (30 mg total) by mouth daily. 90 capsule 1  . ezetimibe (ZETIA) 10 MG tablet Take 1 tablet (10 mg total) by mouth daily. 90 tablet 3  . fluticasone (FLONASE) 50 MCG/ACT nasal spray PLACE 2 SPRAYS INTO BOTH NOSTRILS AT BEDTIME AS NEEDED FOR ALLERGIES. 48 mL 1  . gabapentin (NEURONTIN) 100 MG capsule Take 1 capsule (100 mg total) by mouth 3 (three) times daily. 90 capsule 11  . gabapentin (NEURONTIN) 400 MG capsule Take 1 capsule (400 mg total) by mouth 2 (two) times daily. 180 capsule 4  . losartan (COZAAR) 100 MG tablet TAKE ONE TABLET BY MOUTH ONE TIME DAILY (Patient taking differently: Take 50 mg by mouth daily.) 90 tablet 2  . metoprolol tartrate (LOPRESSOR) 50 MG tablet TAKE ONE TABLET BY MOUTH TWICE DAILY (Patient taking differently: Take 50 mg by mouth 2 (two) times daily.) 180 tablet 2  . MYRBETRIQ 25 MG TB24 tablet Take 25 mg by mouth daily.    . nitroGLYCERIN (NITROSTAT) 0.4 MG SL tablet Place 1 tablet (0.4 mg total) under the tongue every 5 (five) minutes as needed for chest pain. 30 tablet 6  . pantoprazole (PROTONIX) 40 MG tablet Take 1 tablet (40 mg total) by mouth daily. 30 tablet 1  . polyethylene glycol powder (GLYCOLAX/MIRALAX) powder Take 17 g by mouth daily. (Patient taking differently: Take 17 g by mouth every other day.) 500 g 3  . rosuvastatin (CRESTOR) 20 MG tablet Take 1 tablet (20 mg total) by mouth daily. 30 tablet 11  . triamterene-hydrochlorothiazide (MAXZIDE-25) 37.5-25 MG tablet TAKE HALF TABLET BY MOUTH DAILY (Patient taking differently: Take 0.5 tablets by mouth 2 (two) times daily.) 45 tablet 2   No current facility-administered  medications on file prior to visit.   Past Medical History:  Diagnosis Date  . Allergy   . Anxiety   . C. difficile diarrhea    history of  . CAD (coronary artery disease), native coronary artery    a. 05/2017 showed 100% mLCx tx with DES, otherwise 20% mRCA, 50% ramus, 30% dLAD, 20% prox-mid LAD, 20% mLAD treated medically.   . Cancer ([place])    Right kidney CA removed.   . CKD (chronic kidney disease) stage 3, GFR 30-59 ml/min ([place]) 10/11/2016   s/p R nephrectomy  . Colon polyps 2008   HYPERPLASTIC  . Diabetes mellitus without complication ([place])    type 2  no meds  . Gastritis   . GERD (gastroesophageal reflux disease)   . History of echocardiogram    Echo 3/18:  Moderate LVH, EF 17-51, grade 1 diastolic dysfunction, calcified aortic valve, mild MR, moderate LAE  . History of nuclear stress test  Myoview 3/18: Mod size and intensity fixed septal defect, may be artifact.Opposite mod size and intensity lat defect, which is reversible and could represent ischemia or possibly artifact (SDS 4). LVEF 71% with normal wall motion. Intermediate risk study. >> images reviewed with Dr. Dorris Carnes - no sig ischemia; med rx   . Hx of cardiovascular stress test    Lexiscan Myoview 6/16:  EF 70%, no scar or ischemia; Low Risk  . Hyperlipidemia   . Hypertension   . Neuropathy   . Orthostatic hypotension 05/28/2017  . Osteoarthritis   . Rheumatoid arthritis ([facility])    RA (Dr. Ouida Sills) Bilateral hands  . S/P angioplasty with stent 05/27/17 to LCX with DES  05/28/2017  . Thyroid disease    Allergies  Allergen Reactions  . Oxycodone-Acetaminophen Hives and Itching    hallucinations  . Percocet [Oxycodone-Acetaminophen] Hives, Itching and Other (See Comments)    hallucinations  . Pravastatin Sodium Other (See Comments)    Muscle aches Muscle aches  . Hydrocodone Other (See Comments)    "crazy dreams"  . Aspirin Other (See Comments)    GI upset Other reaction(s): Other (See  Comments) REACTION: GI upset    Social History   Socioeconomic History  . Marital status: Divorced    Spouse name: Not on file  . Number of children: 3  . Years of education: 32  . Highest education level: Not on file  Occupational History  . Occupation: Retired    Fish farm manager: RETIRED  Tobacco Use  . Smoking status: Current Every Day Smoker    Packs/day: 0.20    Years: 15.00    Pack years: 3.00    Types: Cigarettes  . Smokeless tobacco: Never Used  Vaping Use  . Vaping Use: Never used  Substance and Sexual Activity  . Alcohol use: No    Alcohol/week: 0.0 standard drinks  . Drug use: No  . Sexual activity: Not on file  Other Topics Concern  . Not on file  Social History Narrative   Single, dgtr lives with her   Current on/off smoker   Alcohol use- no   Drug use-no   Regular Exercise-yes   Social Determinants of Health   Financial Resource Strain: Low Risk   . Difficulty of Paying Living Expenses: Not hard at all  Food Insecurity: No Food Insecurity  . Worried About Charity fundraiser in the Last Year: Never true  . Ran Out of Food in the Last Year: Never true  Transportation Needs: No Transportation Needs  . Lack of Transportation (Medical): No  . Lack of Transportation (Non-Medical): No  Physical Activity: Inactive  . Days of Exercise per Week: 0 days  . Minutes of Exercise per Session: 0 min  Stress: No Stress Concern Present  . Feeling of Stress : Not at all  Social Connections: Moderately Isolated  . Frequency of Communication with Friends and Family: More than three times a week  . Frequency of Social Gatherings with Friends and Family: More than three times a week  . Attends Religious Services: More than 4 times per year  . Active Member of Clubs or Organizations: No  . Attends Archivist Meetings: Never  . Marital Status: Divorced   Vitals:   09/15/20 1453  BP: 118/70  Pulse: 97  Resp: 16  SpO2: 98%   Body mass index is 37.93  kg/m.  Physical Exam Vitals and nursing note reviewed.  HENT:     Head: Normocephalic and atraumatic.  Eyes:     Conjunctiva/sclera: Conjunctivae normal.  Cardiovascular:     Rate and Rhythm: Normal rate and regular rhythm.     Heart sounds: No murmur heard.     Comments: DP pulses present. Pulmonary:     Effort: Pulmonary effort is normal. No respiratory distress.     Breath sounds: Normal breath sounds.  Abdominal:     Palpations: Abdomen is soft.     Tenderness: There is no abdominal tenderness.  Musculoskeletal:     Lumbar back: Tenderness present. No bony tenderness. Positive right straight leg raise test. Negative left straight leg raise test.       Back:     Right lower leg: No edema.     Left lower leg: No edema.       Legs:     Comments: Antalgic gait, not assisted. Pain upon palpation of posterior thigh muscles, no edema or erythema.  Skin:    Findings: No erythema, lesion or rash.  Neurological:     General: No focal deficit present.     Mental Status: She is alert and oriented to person, place, and time.  Psychiatric:        Mood and Affect: Mood is not anxious or depressed.   ASSESSMENT AND PLAN:  Keiri was seen today for hip pain and hospitalization follow-up.  Diagnoses and all orders for this visit:  Pain of right lower extremity We discussed possible causes, radicular, hamstrings. Stretching leg exercises recommended. She agrees with trying prednisone, which she has tolerated well in the past. Some side effects discussed, instructed to take it with food.   -     predniSONE (DELTASONE) 20 MG tablet; 2 tabs for 5 days, 1 tabs for 3 days, 1/2 tabs for 3 days. Take tables together with breakfast.  Acute right-sided low back pain, unspecified whether sciatica present Limitations in regard to analgesics, she can not take NSAID's. Tramadol has been well tolerated in the past. Zanaflex may also help. Side effects of meds discussed.  -     predniSONE  (DELTASONE) 20 MG tablet; 2 tabs for 5 days, 1 tabs for 3 days, 1/2 tabs for 3 days. Take tables together with breakfast. -     traMADol (ULTRAM) 50 MG tablet; Take 1 tablet (50 mg total) by mouth every 12 (twelve) hours as needed for up to 7 days for severe pain. -     tiZANidine (ZANAFLEX) 4 MG tablet; Take 1 tablet (4 mg total) by mouth at bedtime for 15 days.  Hypertension with heart disease BP adequately controlled. Continue Metoprolol Tartrate 50 mg bid and [name] 37.5-25 mg 1/2 tab bid. Monitor BP at home. Continue low salt diet.  Gastroesophageal reflux disease, unspecified whether esophagitis present CP has resolved. Continue Protonix 40 mg daily. GERD precautions. We could try to decrease dose of Protonix to 20 mg.  Coronary artery disease involving native coronary artery of native heart without angina pectoris Continue Plavix 75 mg daily,Aspirin 81 mg daily,Crestor 20 mg daily,Zetia 10 mg daily,and Metoprolol. Following with cardiologist. Encouraged smoking cessation.  Spent 42 minutes.  During this time history was obtained and documented, examination was performed, prior labs/imaging reviewed, and assessment/plan discussed.  Return if symptoms worsen or fail to improve, for Keep next appt..   Veta Dambrosia G. [name], MD  [facility]. [place] office.   A few things to remember from today's visit:   Pain of right lower extremity - Plan: predniSONE (DELTASONE) 20 MG tablet  Acute right-sided  low back pain, unspecified whether sciatica present - Plan: predniSONE (DELTASONE) 20 MG tablet, traMADol (ULTRAM) 50 MG tablet, tiZANidine (ZANAFLEX) 4 MG tablet  Hypertension with heart disease  If you need refills please call your pharmacy. Do not use My Chart to request refills or for acute issues that need immediate attention.   I am thinking it may be a pinched nerve, so we are starting Prednisone and muscle relaxant. Take Prednisone with  breakfast. Tramadol 2 times daily as needed. You can take Tylenol with Tramadol. Zanaflex at bedtime.  Please be sure medication list is accurate. If a new problem present, please set up appointment sooner than planned today.

## 2020-09-15 NOTE — Patient Instructions (Signed)
A few things to remember from today's visit:   Pain of right lower extremity - Plan: predniSONE (DELTASONE) 20 MG tablet  Acute right-sided low back pain, unspecified whether sciatica present - Plan: predniSONE (DELTASONE) 20 MG tablet, traMADol (ULTRAM) 50 MG tablet, tiZANidine (ZANAFLEX) 4 MG tablet  Hypertension with heart disease  If you need refills please call your pharmacy. Do not use My Chart to request refills or for acute issues that need immediate attention.   I am thinking it may be a pinched nerve, so we are starting Prednisone and muscle relaxant. Take Prednisone with breakfast. Tramadol 2 times daily as needed. You can take Tylenol with Tramadol. Zanaflex at bedtime.  Please be sure medication list is accurate. If a new problem present, please set up appointment sooner than planned today.

## 2020-09-16 NOTE — Progress Notes (Signed)
Subjective: 75 year old female presents the office today for concerns of reoccurrence of pain to the top of her left foot.  She had an injection performed last on April 29, 2020 she did well from this without any side effects.  Common symptoms of started to recur.  Denies any recent injury or trauma any changes otherwise. Denies any systemic complaints such as fevers, chills, nausea, vomiting. No acute changes since last appointment, and no other complaints at this time.   Objective: AAO x3, NAD DP/PT pulses palpable bilaterally, CRT less than 3 seconds There is tenderness to palpation on the dorsal aspect of the left midfoot area with prominent bone spur.  There is trace edema there is no erythema or warmth.  There is no pain on the flexor tendons.  Sensory intact.  MMT 5/5.  No pain with calf compression, swelling, warmth, erythema  Assessment: Left foot bone spur, tendinitis  Plan: -All treatment options discussed with the patient including all alternatives, risks, complications.  -Sterile injection performed today.  Skin was cleaned with alcohol and a mixture of 1 cc Kenalog 10, 0.5 cc Marcaine plain, 0.5 cc lidocaine plain was infiltrated into the area of maximal tenderness without complications.  Postinjection care discussed. -Discussion modifications and arch supports and wearing stiffer soled shoe. -Patient encouraged to call the office with any questions, concerns, change in symptoms.   Trula Slade DPM

## 2020-09-19 ENCOUNTER — Telehealth: Payer: Self-pay | Admitting: Family Medicine

## 2020-09-19 NOTE — Telephone Encounter (Signed)
The patient seen Dr. [name] on 09/15/2020 and Dr. [name] prescribed her  predniSONE (DELTASONE) 20 MG tablet  The patient has been feeling slight dizzy headache after taking the medication.  The patient took tylenol to help with the headache.  She hasn't taken the medication today until she talks with Dr. Doug Sou nurse.  Please advise

## 2020-09-19 NOTE — Telephone Encounter (Signed)
Prednisone can sometimes cause headache and dizziness. If she thinks it is the medication, discontinue it now. Monitor for new symptoms. Can take Tylenol 500 mg 3 times per day if needed. If headache gets suddenly worse or associated nausea,vomiting,focal weakness,or confusion she needs to be taken to the ER. Thanks, BJ

## 2020-09-19 NOTE — Telephone Encounter (Signed)
I spoke with pt. We went over message below & she verbalized understanding.

## 2020-09-22 NOTE — Progress Notes (Unsigned)
Cardiology Office Note   Date:  09/23/2020   ID:  [name], [name] 04-May-1946, MRN [id number]  PCP:  [name], [name], MD  Cardiologist:   Dorris Carnes, MD   Patient presents for f/u of CAD and HTN      History of Present Illness: [name] is a 75 y.o. female with a history of CAD  She had a normal myovue in 2016 but in 2018 had lateral defect that was reversible but that I did not feel represented signficant ischemia   The patient presented with CP  CT showed + FFR and she underwent cath    This showed:  LAD 20%; Ramus 50%;  RCA 20% LCx 100%  The pt underent PTCA/DES to LCx   .  Patient also has a history of hypertension, hyperlipidemia, diabetes, chronic kidney disease (status post right nephrectomy), rheumatoid arthritis, OSA.  I saw the pt in Sept 2021  The pt was admitted to [facility] at the end of January with chest pain.  She ruled out for myocardial infarction.  Echocardiogram was done which was normal.  She underwent a Myoview stress test which was also showed no is evidence of ischemia.  The patient says she was under increased stress at the time she was helping her son move whether that contributed.  She also has a history of GI (strictures) she has not noticed a change in her food or ability to swallow.  Since discharge she has not had any further spells.  She is fairly active.  Current Meds  Medication Sig  . acetaminophen (TYLENOL) 500 MG tablet Take 1,000 mg by mouth as needed for moderate pain or headache.   . albuterol (VENTOLIN HFA) 108 (90 Base) MCG/ACT inhaler INHALE ONE OR TWO PUFFS BY MOUTH INTO THE LUNGS EVERY SIX HOURS AS NEEDED FOR WHEEZING OR SHORTNESS OF BREATH  . Alirocumab (PRALUENT) 75 MG/ML SOAJ Inject 75 mg into the skin every 14 (fourteen) days.  [name] Kitchen aspirin 81 MG chewable tablet Chew 81 mg by mouth daily.  . clopidogrel (PLAVIX) 75 MG tablet Take 75 mg by mouth daily.  . colchicine 0.6 MG tablet Take 0.5 tablets (0.3 mg total) by mouth daily.  .  DULoxetine (CYMBALTA) 30 MG capsule Take 1 capsule (30 mg total) by mouth daily.  [name] Kitchen ezetimibe (ZETIA) 10 MG tablet Take 1 tablet (10 mg total) by mouth daily.  . fluticasone (FLONASE) 50 MCG/ACT nasal spray PLACE 2 SPRAYS INTO BOTH NOSTRILS AT BEDTIME AS NEEDED FOR ALLERGIES.  [name] Kitchen gabapentin (NEURONTIN) 100 MG capsule Take 1 capsule (100 mg total) by mouth 3 (three) times daily.  [name] Kitchen gabapentin (NEURONTIN) 400 MG capsule Take 1 capsule (400 mg total) by mouth 2 (two) times daily.  [name] Kitchen losartan (COZAAR) 100 MG tablet Take 50 mg by mouth daily.  . metoprolol tartrate (LOPRESSOR) 50 MG tablet TAKE ONE TABLET BY MOUTH TWICE DAILY  . MYRBETRIQ 25 MG TB24 tablet Take 25 mg by mouth daily.  . nitroGLYCERIN (NITROSTAT) 0.4 MG SL tablet Place 1 tablet (0.4 mg total) under the tongue every 5 (five) minutes as needed for chest pain.  . pantoprazole (PROTONIX) 40 MG tablet Take 1 tablet (40 mg total) by mouth daily.  . polyethylene glycol powder (GLYCOLAX/MIRALAX) powder Take 17 g by mouth daily.  . rosuvastatin (CRESTOR) 20 MG tablet Take 1 tablet (20 mg total) by mouth daily.  [name] Kitchen triamterene-hydrochlorothiazide (MAXZIDE-25) 37.5-25 MG tablet TAKE HALF TABLET BY MOUTH DAILY  Allergies:   Oxycodone-acetaminophen, Percocet [oxycodone-acetaminophen], Pravastatin sodium, Hydrocodone, and Aspirin   Past Medical History:  Diagnosis Date  . Allergy   . Anxiety   . C. difficile diarrhea    history of  . CAD (coronary artery disease), native coronary artery    a. 05/2017 showed 100% mLCx tx with DES, otherwise 20% mRCA, 50% ramus, 30% dLAD, 20% prox-mid LAD, 20% mLAD treated medically.   . Cancer ([place])    Right kidney CA removed.   . CKD (chronic kidney disease) stage 3, GFR 30-59 ml/min ([place]) 10/11/2016   s/p R nephrectomy  . Colon polyps 2008   HYPERPLASTIC  . Diabetes mellitus without complication ([place])    type 2  no meds  . Gastritis   . GERD (gastroesophageal reflux disease)   . History of  echocardiogram    Echo 3/18:  Moderate LVH, EF 57-84, grade 1 diastolic dysfunction, calcified aortic valve, mild MR, moderate LAE  . History of nuclear stress test    Myoview 3/18: Mod size and intensity fixed septal defect, may be artifact.Opposite mod size and intensity lat defect, which is reversible and could represent ischemia or possibly artifact (SDS 4). LVEF 71% with normal wall motion. Intermediate risk study. >> images reviewed with Dr. Dorris Carnes - no sig ischemia; med rx   . Hx of cardiovascular stress test    Lexiscan Myoview 6/16:  EF 70%, no scar or ischemia; Low Risk  . Hyperlipidemia   . Hypertension   . Neuropathy   . Orthostatic hypotension 05/28/2017  . Osteoarthritis   . Rheumatoid arthritis (HCC)    RA (Dr. Ouida Sills) Bilateral hands  . S/P angioplasty with stent 05/27/17 to LCX with DES  05/28/2017  . Thyroid disease     Past Surgical History:  Procedure Laterality Date  . ABDOMINAL HYSTERECTOMY  1978  . ANTERIOR CERVICAL DECOMP/DISCECTOMY FUSION N/A 05/29/2018   Procedure: ANTERIOR CERVICAL DECOMPRESSION FUSION - CERVICAL FIVE-CERVICAL SIX - CERVICAL SIX-CERVICAL SEVEN;  Surgeon: Earnie Larsson, MD;  Location: [facility];  Service: Neurosurgery;  Laterality: N/A;  . BACK SURGERY     x 2  . CARPAL TUNNEL RELEASE Left   . COLONOSCOPY W/ BIOPSIES AND POLYPECTOMY     Hx: of  . CORONARY STENT INTERVENTION N/A 05/27/2017   Procedure: CORONARY STENT INTERVENTION;  Surgeon: Burnell Blanks, MD;  Location: [facility];  Service: Cardiovascular;  Laterality: N/A;  . ESOPHAGOGASTRODUODENOSCOPY    . HNP    . LEFT HEART CATH AND CORONARY ANGIOGRAPHY N/A 05/27/2017   Procedure: LEFT HEART CATH AND CORONARY ANGIOGRAPHY;  Surgeon: Burnell Blanks, MD;  Location: [facility];  Service: Cardiovascular;  Laterality: N/A;  . LUMBAR LAMINECTOMY/DECOMPRESSION MICRODISCECTOMY Left 02/12/2013   Procedure: LUMBAR TWO THREE, LUMBAR THREE FOUR, LUMBAR FOUR FIVE   LAMINECTOMY/DECOMPRESSION MICRODISCECTOMY 3 LEVELS;  Surgeon: Charlie Pitter, MD;  Location: [facility];  Service: Neurosurgery;  Laterality: Left;  . NEPHRECTOMY Right 2010   10.rcc cancer  . TOTAL KNEE ARTHROPLASTY Right    Redo     Social History:  The patient  reports that she has been smoking cigarettes. She has a 3.00 pack-year smoking history. She has never used smokeless tobacco. She reports that she does not drink alcohol and does not use drugs.   Family History:  The patient's family history includes Cancer in her brother; Dementia in her brother and brother; Diabetes in her brother and brother; Heart disease in her father; Kidney disease in  her son; Parkinsonism in her brother; Prostate cancer in her father; Rheum arthritis in her mother; Stroke in her mother.    ROS:  Please see the history of present illness. All other systems are reviewed and  Negative to the above problem except as noted.    PHYSICAL EXAM: VS:  BP (!) 136/54   Pulse 80   Ht 5\' 6"  (1.676 m)   Wt 234 lb 6.4 oz (106.3 kg)   SpO2 95%   BMI 37.83 kg/m   ZHY:QMVHQION obese 75yo in  in no acute distress  HEENT: normal  Neck: no JVD Cardiac: RRR; no murmurs.  No LE  edema  Respiratory:  clear to auscultation  GI: soft, nontender  No masses MS: no deformity Moving all extremities   Skin: warm and dry, no rash Neuro:  Strength and sensation are intact Psych: euthymic mood, full affect   EKG:  EKG is not  ordered today.   Lipid Panel    Component Value Date/Time   CHOL 143 09/11/2020 1227   TRIG 98 09/11/2020 1227   HDL 56 09/11/2020 1227   CHOLHDL 2.6 09/11/2020 1227   CHOLHDL 1.9 08/31/2020 0335   VLDL 20 08/31/2020 0335   LDLCALC 69 09/11/2020 1227   LDLCALC 98 03/26/2020 1041   LDLDIRECT 171.5 12/06/2011 0812      Wt Readings from Last 3 Encounters:  09/23/20 234 lb 6.4 oz (106.3 kg)  09/15/20 235 lb (106.6 kg)  09/01/20 228 lb 12.8 oz (103.8 kg)      ASSESSMENT AND PLAN:  1.  CAD.  Last intervention back in 2018.  The patient was recently admitted with chest pain which does not appear to be due to coronary ischemia. Stress test was negative.  She does have a GI history (strictures);  Question if discomfort was esophageal.  The patient has never taken nitroglycerin sublingually I will refill her prescription and encouraged her to try to take it if she has recurrent spell it can relieve both cardiac and GI source of discomfort.  Keep on current regimen for now.    2.  Hypertension.  Pt's BP is running better at home   629B to 284X systolic   COntinue meds   2.  Hyperlipidemia.  Continue Crestor and Zetia.  Last LDL was 69.  HDL 56 triglycerides 98.  3 hypertension.  Blood pressure is okay.  4.  Diet Discussed diet and  potential areas for change.  She does admit to eating a lot of [employer] cookies recently.  Discussed limiting sugar intake.  She does eat vegetables.  She tries to steer clear of red meat and eats more [place].  Plan for follow-up in the fall, sooner with problems.   Current medicines are reviewed at length with the patient today.  The patient does not have concerns regarding medicines.  Signed, Dorris Carnes, MD  09/23/2020 8:41 AM    [address], [place],   [address] Phone: [phone]; Fax: [phone]

## 2020-09-23 ENCOUNTER — Ambulatory Visit: Payer: Medicare HMO | Admitting: Internal Medicine

## 2020-09-23 ENCOUNTER — Encounter: Payer: Self-pay | Admitting: Internal Medicine

## 2020-09-23 ENCOUNTER — Other Ambulatory Visit: Payer: Self-pay

## 2020-09-23 VITALS — BP 136/54 | HR 80 | Ht 66.0 in | Wt 234.4 lb

## 2020-09-23 DIAGNOSIS — R072 Precordial pain: Secondary | ICD-10-CM

## 2020-09-23 NOTE — Patient Instructions (Signed)
Medication Instructions:  No changes *If you need a refill on your cardiac medications before your next appointment, please call your pharmacy*   Lab Work: none  Testing/Procedures: none   Follow-Up: At [facility], you and your health needs are our priority.  As part of our continuing mission to provide you with exceptional heart care, we have created designated Provider Care Teams.  These Care Teams include your primary Cardiologist (physician) and Advanced Practice Providers (APPs -  Physician Assistants and Nurse Practitioners) who all work together to provide you with the care you need, when you need it.  Your next appointment:   8 month(s) (October)  The format for your next appointment:   In Person  Provider:   You may see Dorris Carnes, MD or one of the following Advanced Practice Providers on your designated Care Team:    Richardson Dopp, PA-C  [place], [place]

## 2020-09-27 ENCOUNTER — Other Ambulatory Visit: Payer: Self-pay | Admitting: Family Medicine

## 2020-09-27 DIAGNOSIS — M545 Low back pain, unspecified: Secondary | ICD-10-CM

## 2020-10-09 DIAGNOSIS — Z85528 Personal history of other malignant neoplasm of kidney: Secondary | ICD-10-CM | POA: Diagnosis not present

## 2020-10-09 DIAGNOSIS — N3941 Urge incontinence: Secondary | ICD-10-CM | POA: Diagnosis not present

## 2020-10-29 DIAGNOSIS — I10 Essential (primary) hypertension: Secondary | ICD-10-CM | POA: Diagnosis not present

## 2020-10-29 DIAGNOSIS — E1151 Type 2 diabetes mellitus with diabetic peripheral angiopathy without gangrene: Secondary | ICD-10-CM | POA: Diagnosis not present

## 2020-10-29 DIAGNOSIS — E785 Hyperlipidemia, unspecified: Secondary | ICD-10-CM | POA: Diagnosis not present

## 2020-10-29 DIAGNOSIS — Z008 Encounter for other general examination: Secondary | ICD-10-CM | POA: Diagnosis not present

## 2020-10-29 DIAGNOSIS — M0550 Rheumatoid polyneuropathy with rheumatoid arthritis of unspecified site: Secondary | ICD-10-CM | POA: Diagnosis not present

## 2020-10-29 DIAGNOSIS — I2510 Atherosclerotic heart disease of native coronary artery without angina pectoris: Secondary | ICD-10-CM | POA: Diagnosis not present

## 2020-10-29 DIAGNOSIS — F419 Anxiety disorder, unspecified: Secondary | ICD-10-CM | POA: Diagnosis not present

## 2020-10-29 DIAGNOSIS — E1142 Type 2 diabetes mellitus with diabetic polyneuropathy: Secondary | ICD-10-CM | POA: Diagnosis not present

## 2020-10-29 DIAGNOSIS — R69 Illness, unspecified: Secondary | ICD-10-CM | POA: Diagnosis not present

## 2020-10-30 ENCOUNTER — Other Ambulatory Visit: Payer: Self-pay | Admitting: Family Medicine

## 2020-10-30 DIAGNOSIS — R062 Wheezing: Secondary | ICD-10-CM

## 2020-10-30 DIAGNOSIS — M533 Sacrococcygeal disorders, not elsewhere classified: Secondary | ICD-10-CM | POA: Diagnosis not present

## 2020-10-31 ENCOUNTER — Other Ambulatory Visit: Payer: Self-pay

## 2020-11-03 ENCOUNTER — Ambulatory Visit (INDEPENDENT_AMBULATORY_CARE_PROVIDER_SITE_OTHER): Payer: Medicare HMO | Admitting: Family Medicine

## 2020-11-03 ENCOUNTER — Encounter: Payer: Self-pay | Admitting: Family Medicine

## 2020-11-03 ENCOUNTER — Ambulatory Visit (INDEPENDENT_AMBULATORY_CARE_PROVIDER_SITE_OTHER): Payer: Medicare HMO

## 2020-11-03 ENCOUNTER — Other Ambulatory Visit: Payer: Self-pay

## 2020-11-03 VITALS — BP 130/80 | HR 70 | Resp 16 | Ht 66.0 in | Wt 229.4 lb

## 2020-11-03 DIAGNOSIS — I739 Peripheral vascular disease, unspecified: Secondary | ICD-10-CM

## 2020-11-03 DIAGNOSIS — R0989 Other specified symptoms and signs involving the circulatory and respiratory systems: Secondary | ICD-10-CM | POA: Diagnosis not present

## 2020-11-03 DIAGNOSIS — E1122 Type 2 diabetes mellitus with diabetic chronic kidney disease: Secondary | ICD-10-CM

## 2020-11-03 DIAGNOSIS — N183 Chronic kidney disease, stage 3 unspecified: Secondary | ICD-10-CM

## 2020-11-03 DIAGNOSIS — G629 Polyneuropathy, unspecified: Secondary | ICD-10-CM

## 2020-11-03 DIAGNOSIS — E538 Deficiency of other specified B group vitamins: Secondary | ICD-10-CM

## 2020-11-03 DIAGNOSIS — I119 Hypertensive heart disease without heart failure: Secondary | ICD-10-CM | POA: Diagnosis not present

## 2020-11-03 DIAGNOSIS — N1831 Chronic kidney disease, stage 3a: Secondary | ICD-10-CM | POA: Diagnosis not present

## 2020-11-03 DIAGNOSIS — R5383 Other fatigue: Secondary | ICD-10-CM | POA: Diagnosis not present

## 2020-11-03 DIAGNOSIS — R062 Wheezing: Secondary | ICD-10-CM | POA: Diagnosis not present

## 2020-11-03 MED ORDER — METOPROLOL SUCCINATE ER 100 MG PO TB24
100.0000 mg | ORAL_TABLET | Freq: Every day | ORAL | 1 refills | Status: DC
Start: 2020-11-03 — End: 2021-05-25

## 2020-11-03 NOTE — Assessment & Plan Note (Signed)
We discussed possible etiologies: Systemic illness, immunologic,endocrinology,sleep disorder, psychiatric/psychologic, infectious,medications side effects, and idiopathic. Examination today does not suggest a serious process. Some of ehr meds and chronic medical problems can be contributing factors. She will call and re-schedule sleep study.  Further recommendations will be given according to lab results.

## 2020-11-03 NOTE — Assessment & Plan Note (Signed)
Reports seeing her nephrologist last month. Adequate BP and glucose controlled. Continue avoiding NSAID's. Low salt diet and adequate hydration.

## 2020-11-03 NOTE — Assessment & Plan Note (Signed)
Following with neurologist. Gabapentin can be contributing to fatigue. Urine and serum electrophoresis ordered.

## 2020-11-03 NOTE — Patient Instructions (Addendum)
A few things to remember from today's visit:   PAD (peripheral artery disease) ([place]) - Plan: VAS [place] ABI WITH/WO TBI  Hypertension with heart disease - Plan: metoprolol succinate (TOPROL-XL) 100 MG 24 hr tablet  Fatigue, unspecified type  If you need refills please call your pharmacy. Do not use My Chart to request refills or for acute issues that need immediate attention.   Fasting labs next week. Please arrange sleep study. Circulation test will be arranged. Tobacco cessation is very important.  Please be sure medication list is accurate. If a new problem present, please set up appointment sooner than planned today.

## 2020-11-03 NOTE — Assessment & Plan Note (Signed)
Problem has been adequately controlled. Continue non pharmacologic treatment. Further recommendations according to HgA1C result.

## 2020-11-03 NOTE — Assessment & Plan Note (Signed)
BP adequately controlled. Metoprolol tartrate cougl be contributing to fatigue, changed to Metoprolol succinate. No changes in [name] or Losartan dose.  Monitor BP at home.

## 2020-11-03 NOTE — Assessment & Plan Note (Signed)
Further recommendations according to B12 result.

## 2020-11-03 NOTE — Progress Notes (Signed)
Chief Complaint  Patient presents with  . feeling sluggish   HPI: [name] is a 75 y.o. female, who is here today with above complaint. Fatigue, has "no energy." We addressed similar concern on 07/07/20. Problem has been going on for several months but seems to be getting worse.  Sometimes she has difficulty falling asleep at night. Falls asleep watching TV,wakes up and goes to bed and watches tv for couple hours. Sleeps about 6-6.5 hours. She does not feels rested when she wakes up.  Denies having difficulty staying awake while driving. She was supposed to have sleep study done but had to cancel, has not re-schedule.  Depressed lately because her daughter is going through some difficulties. Hx of depression, recently broke up with boyfriend. Her daughter lives with her. Her son recently moved to his own place, she still checks on him regularly and helps with housekeeping. She is on Cymbalta 30 mg daily (also added to help with OA and back pain).  Since her last visit she had a home visit from her health insurance and it was recommended to see vascular surgeon. Feet feels "like ice", this has been going on for a while. She has not identified exacerbating or alleviating factors.  She is very concerned about PAD because her son has this problem and s/p bilateral LE amputation. She has not noted ulcers or cyanosis. Negative for claudication like symptoms.  CAD, she follows with cardiologist regularly. She is on Plavix 75 mg daily ,Aspirin 81 mg daily,Praluent 75 mg q 14 days,Zetia 10 mg daily,,and Crestor 20 mg daily.  HTN on Metoprolol succinate 100 mg daily,[name] 37.5-25 mg daily,Losartan 100 mg daily. Negative for severe/frequent headache, visual changes, chest pain, dyspnea, palpitation, focal weakness, or worsening edema.  CKD III:  She recently saw her nephrologist and had blood work done. Negative for foam in urine,decreased urine output,or gross  hematuria.  Lab Results  Component Value Date   CREATININE 1.34 (H) 09/01/2020   BUN 17 09/01/2020   NA 139 09/01/2020   K 3.7 09/01/2020   CL 105 09/01/2020   CO2 23 09/01/2020   Lab Results  Component Value Date   TSH 1.87 09/26/2019   Lab Results  Component Value Date   WBC 8.4 09/01/2020   HGB 13.7 09/01/2020   HCT 41.8 09/01/2020   MCV 91.1 09/01/2020   PLT 216 09/01/2020   B12 deficiency: She is not on B12 supplementation.  Lab Results  Component Value Date   [id number] 016 12/01/2016   Polyneuropathy: She is on Gabapentin 400 mg bid and 100 mg tid. Follows with neurologist.  EMG 09/2016: Abnormal study demonstrating: 1. 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 2. Superimposed left median neuropathy at the wrist consistent with left carpal tunnel syndrome.  Chronic lower back pain with radiculopathy. She is following with ortho and planning on having epidural injection.  DM II: She is on non pharmacologic treatment. Negative for polydipsia,polyuria, or polyphagia.  Lab Results  Component Value Date   HGBA1C 6.3 (H) 03/26/2020   Lab Results  Component Value Date   MICROALBUR 5.8 03/26/2020   MICROALBUR 16.9 (H) 09/26/2019   Today fine rales noted, bibasilar. She has not not had cough or SOB. Intermittent wheezing, which she attributes to allergies. She uses Albuterol inhaler as needed. + Smoker. Tobacco used since age 80, max 1/4 PPD, 5 cig daily.  CXR 08/30/20:  There is scattered bilateral linear airspace opacities, not substantially  changed from prior study. There is no pneumothorax. No large pleural effusion. The heart size is stable.  Review of Systems  Constitutional: Positive for fatigue. Negative for activity change, appetite change and fever.  HENT: Negative for mouth sores, nosebleeds and sore throat.   Gastrointestinal: Negative for abdominal pain, nausea and vomiting.       Negative for  changes in bowel habits.  Musculoskeletal: Positive for arthralgias and back pain. Negative for gait problem.  Skin: Negative for pallor and rash.  Allergic/Immunologic: Positive for environmental allergies.  Neurological: Negative for syncope, facial asymmetry and weakness.  Psychiatric/Behavioral: Positive for sleep disturbance. Negative for confusion. The patient is nervous/anxious.   Rest see pertinent positives and negatives per HPI.  Current Outpatient Medications on File Prior to Visit  Medication Sig Dispense Refill  . acetaminophen (TYLENOL) 500 MG tablet Take 1,000 mg by mouth as needed for moderate pain or headache.     . albuterol (VENTOLIN HFA) 108 (90 Base) MCG/ACT inhaler INHALE ONE OR TWO PUFFS BY MOUTH INTO THE LUNGS EVERY SIX HOURS AS NEEDED FOR WHEEZING OR SHORTNESS OF BREATH 8.5 g 0  . Alirocumab (PRALUENT) 75 MG/ML SOAJ Inject 75 mg into the skin every 14 (fourteen) days. 2 mL 11  . aspirin 81 MG chewable tablet Chew 81 mg by mouth daily.    . clopidogrel (PLAVIX) 75 MG tablet Take 75 mg by mouth daily.    . colchicine 0.6 MG tablet Take 0.5 tablets (0.3 mg total) by mouth daily. 45 tablet 0  . DULoxetine (CYMBALTA) 30 MG capsule Take 1 capsule (30 mg total) by mouth daily. 90 capsule 1  . ezetimibe (ZETIA) 10 MG tablet Take 1 tablet (10 mg total) by mouth daily. 90 tablet 3  . fluticasone (FLONASE) 50 MCG/ACT nasal spray PLACE 2 SPRAYS INTO BOTH NOSTRILS AT BEDTIME AS NEEDED FOR ALLERGIES. 48 mL 1  . gabapentin (NEURONTIN) 100 MG capsule Take 1 capsule (100 mg total) by mouth 3 (three) times daily. 90 capsule 11  . gabapentin (NEURONTIN) 400 MG capsule Take 1 capsule (400 mg total) by mouth 2 (two) times daily. 180 capsule 4  . losartan (COZAAR) 100 MG tablet Take 50 mg by mouth daily.    [name] Kitchen MYRBETRIQ 25 MG TB24 tablet Take 25 mg by mouth daily.    . nitroGLYCERIN (NITROSTAT) 0.4 MG SL tablet Place 1 tablet (0.4 mg total) under the tongue every 5 (five) minutes as needed  for chest pain. 30 tablet 6  . pantoprazole (PROTONIX) 40 MG tablet Take 1 tablet (40 mg total) by mouth daily. 30 tablet 1  . polyethylene glycol powder (GLYCOLAX/MIRALAX) powder Take 17 g by mouth daily. 500 g 3  . rosuvastatin (CRESTOR) 20 MG tablet Take 1 tablet (20 mg total) by mouth daily. 30 tablet 11  . triamterene-hydrochlorothiazide (MAXZIDE-25) 37.5-25 MG tablet TAKE HALF TABLET BY MOUTH DAILY 45 tablet 2   No current facility-administered medications on file prior to visit.   Past Medical History:  Diagnosis Date  . Allergy   . Anxiety   . C. difficile diarrhea    history of  . CAD (coronary artery disease), native coronary artery    a. 05/2017 showed 100% mLCx tx with DES, otherwise 20% mRCA, 50% ramus, 30% dLAD, 20% prox-mid LAD, 20% mLAD treated medically.   . Cancer ([place])    Right kidney CA removed.   . CKD (chronic kidney disease) stage 3, GFR 30-59 ml/min ([place]) 10/11/2016   s/p R nephrectomy  .  Colon polyps 2008   HYPERPLASTIC  . Diabetes mellitus without complication ([place])    type 2  no meds  . Gastritis   . GERD (gastroesophageal reflux disease)   . History of echocardiogram    Echo 3/18:  Moderate LVH, EF 10-27, grade 1 diastolic dysfunction, calcified aortic valve, mild MR, moderate LAE  . History of nuclear stress test    Myoview 3/18: Mod size and intensity fixed septal defect, may be artifact.Opposite mod size and intensity lat defect, which is reversible and could represent ischemia or possibly artifact (SDS 4). LVEF 71% with normal wall motion. Intermediate risk study. >> images reviewed with Dr. Dorris Carnes - no sig ischemia; med rx   . Hx of cardiovascular stress test    Lexiscan Myoview 6/16:  EF 70%, no scar or ischemia; Low Risk  . Hyperlipidemia   . Hypertension   . Neuropathy   . Orthostatic hypotension 05/28/2017  . Osteoarthritis   . Rheumatoid arthritis (HCC)    RA (Dr. Ouida Sills) Bilateral hands  . S/P angioplasty with stent 05/27/17 to LCX  with DES  05/28/2017  . Thyroid disease    Allergies  Allergen Reactions  . Oxycodone-Acetaminophen Hives and Itching    hallucinations  . Percocet [Oxycodone-Acetaminophen] Hives, Itching and Other (See Comments)    hallucinations  . Pravastatin Sodium Other (See Comments)    Muscle aches Muscle aches  . Hydrocodone Other (See Comments)    "crazy dreams"  . Aspirin Other (See Comments)    GI upset Other reaction(s): Other (See Comments) REACTION: GI upset    Social History   Socioeconomic History  . Marital status: Divorced    Spouse name: Not on file  . Number of children: 3  . Years of education: 80  . Highest education level: Not on file  Occupational History  . Occupation: Retired    Fish farm manager: RETIRED  Tobacco Use  . Smoking status: Current Every Day Smoker    Packs/day: 0.20    Years: 15.00    Pack years: 3.00    Types: Cigarettes  . Smokeless tobacco: Never Used  Vaping Use  . Vaping Use: Never used  Substance and Sexual Activity  . Alcohol use: No    Alcohol/week: 0.0 standard drinks  . Drug use: No  . Sexual activity: Not on file  Other Topics Concern  . Not on file  Social History Narrative   Single, dgtr lives with her   Current on/off smoker   Alcohol use- no   Drug use-no   Regular Exercise-yes   Social Determinants of Health   Financial Resource Strain: Low Risk   . Difficulty of Paying Living Expenses: Not hard at all  Food Insecurity: No Food Insecurity  . Worried About Charity fundraiser in the Last Year: Never true  . Ran Out of Food in the Last Year: Never true  Transportation Needs: No Transportation Needs  . Lack of Transportation (Medical): No  . Lack of Transportation (Non-Medical): No  Physical Activity: Inactive  . Days of Exercise per Week: 0 days  . Minutes of Exercise per Session: 0 min  Stress: No Stress Concern Present  . Feeling of Stress : Not at all  Social Connections: Moderately Isolated  . Frequency of  Communication with Friends and Family: More than three times a week  . Frequency of Social Gatherings with Friends and Family: More than three times a week  . Attends Religious Services: More than 4 times per year  .  Active Member of Clubs or Organizations: No  . Attends Archivist Meetings: Never  . Marital Status: Divorced   Vitals:   11/03/20 1537  BP: 130/80  Pulse: 70  Resp: 16  SpO2: 97%   Wt Readings from Last 3 Encounters:  11/03/20 229 lb 6 oz (104 kg)  09/23/20 234 lb 6.4 oz (106.3 kg)  09/15/20 235 lb (106.6 kg)   Body mass index is 37.02 kg/m.  Physical Exam Vitals and nursing note reviewed.  Constitutional:      General: She is not in acute distress.    Appearance: She is well-developed.  HENT:     Head: Normocephalic and atraumatic.     Mouth/Throat:     Mouth: Mucous membranes are moist.     Pharynx: Oropharynx is clear.  Eyes:     Conjunctiva/sclera: Conjunctivae normal.  Cardiovascular:     Rate and Rhythm: Normal rate and regular rhythm.     Heart sounds: No murmur heard.     Comments: DP and PT present, bilateral. Pulmonary:     Effort: Pulmonary effort is normal. No respiratory distress.     Breath sounds: Rales (Fine bilateral.) present. No wheezing or rhonchi.  Abdominal:     Palpations: Abdomen is soft. There is no hepatomegaly or mass.     Tenderness: There is no abdominal tenderness.  Lymphadenopathy:     Cervical: No cervical adenopathy.  Skin:    General: Skin is warm.     Findings: No erythema or rash.  Neurological:     General: No focal deficit present.     Mental Status: She is alert and oriented to person, place, and time.     Cranial Nerves: No cranial nerve deficit.     Gait: Gait normal.  Psychiatric:        Mood and Affect: Mood is anxious.        Thought Content: Thought content does not include suicidal ideation.     Comments: Well groomed, good eye contact.    ASSESSMENT AND PLAN:  [name] was seen today  for feeling sluggish.  Diagnoses and all orders for this visit: Orders Placed This Encounter  Procedures  . DG Chest 2 View  . TSH  . Vitamin B12  . C-reactive protein  . Cortisol, free, Serum  . Hemoglobin A1c  . Fructosamine  . Protein Electrophoresis, Urine Rflx.  . Protein Electrophoresis, Serum  . VAS [place] ABI WITH/WO TBI    PAD (peripheral artery disease) (HCC) Abnormal home screening test RLE 0.78 and LLE 0.99. We discussed current recommendations for PAD screening. She has several risk factors. Already on Plavix,aspirine,and Crestor. ABI order today, if abnormal vascular referral can be considered. Appropriate foot care, also with neuropathy.  Chest rales Reporting occasional wheezing. Most likely COPD. CXR ordered today. Will consider chest CT given her hx of tobacco use + worsening fatigue. Encouraged smoking cessation.  Polyneuropathy Following with neurologist. Gabapentin can be contributing to fatigue. Urine and serum electrophoresis ordered.  CKD (chronic kidney disease) stage 3, GFR 30-59 ml/min (HCC) Reports seeing her nephrologist last month. Adequate BP and glucose controlled. Continue avoiding NSAID's. Low salt diet and adequate hydration.  Hypertension with heart disease BP adequately controlled. Metoprolol tartrate cougl be contributing to fatigue, changed to Metoprolol succinate. No changes in [name] or Losartan dose.  Monitor BP at home.  DM (diabetes mellitus), type 2 with renal complications (HCC) Problem has been adequately controlled. Continue non pharmacologic treatment. Further recommendations according to  HgA1C result.  Fatigue We discussed possible etiologies: Systemic illness, immunologic,endocrinology,sleep disorder, psychiatric/psychologic, infectious,medications side effects, and idiopathic. Examination today does not suggest a serious process. Some of ehr meds and chronic medical problems can be contributing  factors. She will call and re-schedule sleep study.  Further recommendations will be given according to lab results.   B12 deficiency Further recommendations according to B12 result.   Spent 55 minutes.  During this time history was obtained,, examination was performed, prior labs/imaging reviewed,  assessment/plan discussed, and documentation completed. Some labs are better to do in am and fasting, so she will come back later this week for morning labs.  Return if symptoms worsen or fail to improve, for Lab appt next week and keep next appt..   Salif Tay G. [name], MD  [facility] [place] office.   A few things to remember from today's visit:   PAD (peripheral artery disease) ([place]) - Plan: VAS [place] ABI WITH/WO TBI  Hypertension with heart disease - Plan: metoprolol succinate (TOPROL-XL) 100 MG 24 hr tablet  Fatigue, unspecified type  If you need refills please call your pharmacy. Do not use My Chart to request refills or for acute issues that need immediate attention.   Fasting labs next week. Please arrange sleep study. Circulation test will be arranged. Tobacco cessation is very important.  Please be sure medication list is accurate. If a new problem present, please set up appointment sooner than planned today.

## 2020-11-04 ENCOUNTER — Ambulatory Visit (HOSPITAL_COMMUNITY)
Admission: RE | Admit: 2020-11-04 | Discharge: 2020-11-04 | Disposition: A | Discharge status: Home / Self Care | Payer: Medicare HMO | Source: Ambulatory Visit | Attending: Family Medicine | Admitting: Family Medicine

## 2020-11-04 ENCOUNTER — Other Ambulatory Visit: Payer: Self-pay | Admitting: Family Medicine

## 2020-11-04 DIAGNOSIS — I739 Peripheral vascular disease, unspecified: Secondary | ICD-10-CM | POA: Diagnosis not present

## 2020-11-04 DIAGNOSIS — R0989 Other specified symptoms and signs involving the circulatory and respiratory systems: Secondary | ICD-10-CM

## 2020-11-04 DIAGNOSIS — R062 Wheezing: Secondary | ICD-10-CM

## 2020-11-08 DIAGNOSIS — Z20822 Contact with and (suspected) exposure to covid-19: Secondary | ICD-10-CM | POA: Diagnosis not present

## 2020-11-08 DIAGNOSIS — Z20828 Contact with and (suspected) exposure to other viral communicable diseases: Secondary | ICD-10-CM | POA: Diagnosis not present

## 2020-11-10 ENCOUNTER — Other Ambulatory Visit: Payer: Self-pay

## 2020-11-10 ENCOUNTER — Other Ambulatory Visit (INDEPENDENT_AMBULATORY_CARE_PROVIDER_SITE_OTHER): Payer: Medicare HMO

## 2020-11-10 DIAGNOSIS — N1831 Chronic kidney disease, stage 3a: Secondary | ICD-10-CM

## 2020-11-10 DIAGNOSIS — E1122 Type 2 diabetes mellitus with diabetic chronic kidney disease: Secondary | ICD-10-CM

## 2020-11-10 DIAGNOSIS — N183 Chronic kidney disease, stage 3 unspecified: Secondary | ICD-10-CM | POA: Diagnosis not present

## 2020-11-10 DIAGNOSIS — R5383 Other fatigue: Secondary | ICD-10-CM

## 2020-11-10 DIAGNOSIS — E538 Deficiency of other specified B group vitamins: Secondary | ICD-10-CM | POA: Diagnosis not present

## 2020-11-10 LAB — TSH: TSH: 2.91 u[IU]/mL (ref 0.35–4.50)

## 2020-11-10 LAB — HEMOGLOBIN A1C: Hgb A1c MFr Bld: 6.6 % — ABNORMAL HIGH (ref 4.6–6.5)

## 2020-11-10 LAB — C-REACTIVE PROTEIN: CRP: 1.1 mg/dL (ref 0.5–20.0)

## 2020-11-10 LAB — VITAMIN B12: Vitamin B-12: 232 pg/mL (ref 211–911)

## 2020-11-10 NOTE — Addendum Note (Signed)
Addended by: Elmer Picker on: 11/10/2020 09:25 AM   Modules accepted: Orders

## 2020-11-10 NOTE — Addendum Note (Signed)
Addended by: Elmer Picker on: 11/10/2020 09:24 AM   Modules accepted: Orders

## 2020-11-11 DIAGNOSIS — M461 Sacroiliitis, not elsewhere classified: Secondary | ICD-10-CM | POA: Diagnosis not present

## 2020-11-11 DIAGNOSIS — M533 Sacrococcygeal disorders, not elsewhere classified: Secondary | ICD-10-CM | POA: Diagnosis not present

## 2020-11-18 LAB — PROTEIN ELECTROPHORESIS, URINE REFLEX
Albumin ELP, Urine: 62.6 %
Alpha-1-Globulin, U: 2.4 %
Alpha-2-Globulin, U: 6.7 %
Beta Globulin, U: 17.6 %
Gamma Globulin, U: 10.7 %
Protein, Ur: 17.2 mg/dL

## 2020-11-18 LAB — PROTEIN ELECTROPHORESIS, SERUM
A/G Ratio: 1 (ref 0.7–1.7)
Albumin ELP: 3.4 g/dL (ref 2.9–4.4)
Alpha 1: 0.2 g/dL (ref 0.0–0.4)
Alpha 2: 0.7 g/dL (ref 0.4–1.0)
Beta: 1.1 g/dL (ref 0.7–1.3)
Gamma Globulin: 1.5 g/dL (ref 0.4–1.8)
Globulin, Total: 3.5 g/dL (ref 2.2–3.9)
Total Protein: 6.9 g/dL (ref 6.0–8.5)

## 2020-11-18 LAB — FRUCTOSAMINE: Fructosamine: 226 umol/L (ref 0–285)

## 2020-11-18 LAB — CORTISOL, FREE: Cortisol, Free Dialysis, LCMS: 0.21 ug/dL

## 2020-11-19 ENCOUNTER — Ambulatory Visit (INDEPENDENT_AMBULATORY_CARE_PROVIDER_SITE_OTHER)
Admission: RE | Admit: 2020-11-19 | Discharge: 2020-11-19 | Disposition: A | Discharge status: Home / Self Care | Payer: Medicare HMO | Source: Ambulatory Visit | Attending: Family Medicine | Admitting: Family Medicine

## 2020-11-19 ENCOUNTER — Other Ambulatory Visit: Payer: Self-pay

## 2020-11-19 DIAGNOSIS — J439 Emphysema, unspecified: Secondary | ICD-10-CM | POA: Diagnosis not present

## 2020-11-19 DIAGNOSIS — R062 Wheezing: Secondary | ICD-10-CM | POA: Diagnosis not present

## 2020-11-19 DIAGNOSIS — R0989 Other specified symptoms and signs involving the circulatory and respiratory systems: Secondary | ICD-10-CM | POA: Diagnosis not present

## 2020-11-20 ENCOUNTER — Other Ambulatory Visit: Payer: Self-pay | Admitting: Family Medicine

## 2020-11-20 DIAGNOSIS — J3089 Other allergic rhinitis: Secondary | ICD-10-CM

## 2020-11-20 DIAGNOSIS — R062 Wheezing: Secondary | ICD-10-CM

## 2020-11-24 ENCOUNTER — Ambulatory Visit: Payer: Medicare HMO

## 2020-11-25 ENCOUNTER — Other Ambulatory Visit: Payer: Self-pay

## 2020-11-26 ENCOUNTER — Ambulatory Visit (INDEPENDENT_AMBULATORY_CARE_PROVIDER_SITE_OTHER): Payer: Medicare HMO | Admitting: Family Medicine

## 2020-11-26 ENCOUNTER — Encounter: Payer: Self-pay | Admitting: Family Medicine

## 2020-11-26 VITALS — BP 148/88 | HR 77 | Temp 98.2°F | Resp 16 | Ht 66.0 in | Wt 226.2 lb

## 2020-11-26 DIAGNOSIS — J479 Bronchiectasis, uncomplicated: Secondary | ICD-10-CM

## 2020-11-26 DIAGNOSIS — R42 Dizziness and giddiness: Secondary | ICD-10-CM

## 2020-11-26 DIAGNOSIS — G4730 Sleep apnea, unspecified: Secondary | ICD-10-CM

## 2020-11-26 DIAGNOSIS — J301 Allergic rhinitis due to pollen: Secondary | ICD-10-CM | POA: Diagnosis not present

## 2020-11-26 DIAGNOSIS — J0100 Acute maxillary sinusitis, unspecified: Secondary | ICD-10-CM | POA: Diagnosis not present

## 2020-11-26 MED ORDER — MONTELUKAST SODIUM 10 MG PO TABS: 10.0000 mg | ORAL_TABLET | Freq: Every day | ORAL | 3 refills | Status: DC

## 2020-11-26 MED ORDER — AMOXICILLIN-POT CLAVULANATE 875-125 MG PO TABS: 1.0000 | ORAL_TABLET | Freq: Two times a day (BID) | ORAL | 0 refills | Status: AC

## 2020-11-26 NOTE — Patient Instructions (Addendum)
A few things to remember from today's visit:  Lightheadedness  Acute non-recurrent maxillary sinusitis  Seasonal allergic rhinitis due to pollen  If you need refills please call your pharmacy. Do not use My Chart to request refills or for acute issues that need immediate attention.   Nasal saline irrigation as needed. Continue Flonase nasal spray and Claritin. Singulair 10 mg at bedtime added today. If face pain is not any better start antibiotic.  Pulmonology appt will be arranged.  Please be sure medication list is accurate. If a new problem present, please set up appointment sooner than planned today.

## 2020-11-26 NOTE — Progress Notes (Signed)
Chief Complaint  Patient presents with  . Headache  . Dizziness   HPI: [name] is a 75 y.o. female with hx of polyneuropathy,HTN,CAD,HLD,CKD III, and allergies here today with above complaints. Symptoms have been going on for a week.  She states that she has no headache but rather pressure and puffiness under her eyes. Nasal congestion,rhinorrhea,post nasal drainage, epiphora,and eye pruritus. She is using Flonase nasal spray, started yesterday. No sick contact. Negative for fever,chills,sore throat, anosmia, and ageusia. + Fatigue,chronic and stable. She has sleep test in 10/2018, she was supposed to have CPAP titration study done. She has not had sleep study done, planning on re-scheduling.  Lightheadedness exacerbated by movement but can also happen when sitting. It is not like spinning sensation.  It last a few minutes at the time. No associated visual changes, CP,palpitations,worsening SOB,or diaphoresis.  She was last seen on 11/03/20, when chest CT was ordered due to persistent wheezing and SOB + rales noted on auscultation. SOB and wheezing have improved. Occasional productive cough with clear sputum. + Smoker. Negative for abnormal wt loss,hemoptysis.  Chest CT showed bronchiectasis. 11/19/20: 1. No acute findings in the chest. 2. Minimal emphysematous changes at the right lung apex. 3. Mild cylindrical bronchiectasis within the right middle lobe and bilateral lower lobes, which may be post infectious or post inflammatory. BP mildly elevated today. BP at home is "good." She is on Metoprolol Succinate 100 mg daily,[name] 37.5-25 mg daily,and Losartan 100 mg daily.  Review of Systems  Constitutional: Negative for activity change and appetite change.  HENT: Negative for mouth sores and nosebleeds.   Eyes: Negative for pain and visual disturbance.  Gastrointestinal: Negative for abdominal pain, nausea and vomiting.       Negative for changes in  bowel habits.  Genitourinary: Negative for decreased urine volume and hematuria.  Musculoskeletal: Negative for gait problem.  Skin: Negative for pallor and rash.  Neurological: Negative for syncope, facial asymmetry and weakness.  Psychiatric/Behavioral: Positive for sleep disturbance. Negative for confusion. The patient is nervous/anxious.   Rest see pertinent positives and negatives per HPI.  Current Outpatient Medications on File Prior to Visit  Medication Sig Dispense Refill  . acetaminophen (TYLENOL) 500 MG tablet Take 1,000 mg by mouth as needed for moderate pain or headache.     . albuterol (VENTOLIN HFA) 108 (90 Base) MCG/ACT inhaler INHALE ONE OR TWO PUFFS BY MOUTH INTO THE LUNGS EVERY SIX HOURS AS NEEDED FOR WHEEZING OR SHORTNESS OF BREATH 8.5 g 0  . Alirocumab (PRALUENT) 75 MG/ML SOAJ Inject 75 mg into the skin every 14 (fourteen) days. 2 mL 11  . aspirin 81 MG chewable tablet Chew 81 mg by mouth daily.    . clopidogrel (PLAVIX) 75 MG tablet Take 75 mg by mouth daily.    . colchicine 0.6 MG tablet Take 0.5 tablets (0.3 mg total) by mouth daily. 45 tablet 0  . DULoxetine (CYMBALTA) 30 MG capsule Take 1 capsule (30 mg total) by mouth daily. 90 capsule 1  . ezetimibe (ZETIA) 10 MG tablet Take 1 tablet (10 mg total) by mouth daily. 90 tablet 3  . fluticasone (FLONASE) 50 MCG/ACT nasal spray PLACE 2 SPRAYS INTO BOTH NOSTRILS AT BEDTIME AS NEEDED FOR ALLERGIES. 48 mL 1  . gabapentin (NEURONTIN) 100 MG capsule Take 1 capsule (100 mg total) by mouth 3 (three) times daily. 90 capsule 11  . gabapentin (NEURONTIN) 400 MG capsule Take 1 capsule (400 mg total) by mouth 2 (two)  times daily. 180 capsule 4  . losartan (COZAAR) 100 MG tablet TAKE ONE TABLET BY MOUTH ONE TIME DAILY 90 tablet 0  . metoprolol succinate (TOPROL-XL) 100 MG 24 hr tablet Take 1 tablet (100 mg total) by mouth daily. Take with or immediately following a meal. 90 tablet 1  . MYRBETRIQ 25 MG TB24 tablet Take 25 mg by mouth  daily.    . nitroGLYCERIN (NITROSTAT) 0.4 MG SL tablet Place 1 tablet (0.4 mg total) under the tongue every 5 (five) minutes as needed for chest pain. 30 tablet 6  . pantoprazole (PROTONIX) 40 MG tablet Take 1 tablet (40 mg total) by mouth daily. 30 tablet 1  . polyethylene glycol powder (GLYCOLAX/MIRALAX) powder Take 17 g by mouth daily. 500 g 3  . rosuvastatin (CRESTOR) 20 MG tablet Take 1 tablet (20 mg total) by mouth daily. 30 tablet 11  . triamterene-hydrochlorothiazide (MAXZIDE-25) 37.5-25 MG tablet TAKE HALF TABLET BY MOUTH DAILY 45 tablet 2   No current facility-administered medications on file prior to visit.   Past Medical History:  Diagnosis Date  . Allergy   . Anxiety   . C. difficile diarrhea    history of  . CAD (coronary artery disease), native coronary artery    a. 05/2017 showed 100% mLCx tx with DES, otherwise 20% mRCA, 50% ramus, 30% dLAD, 20% prox-mid LAD, 20% mLAD treated medically.   . Cancer ([place])    Right kidney CA removed.   . CKD (chronic kidney disease) stage 3, GFR 30-59 ml/min ([place]) 10/11/2016   s/p R nephrectomy  . Colon polyps 2008   HYPERPLASTIC  . Diabetes mellitus without complication ([place])    type 2  no meds  . Gastritis   . GERD (gastroesophageal reflux disease)   . History of echocardiogram    Echo 3/18:  Moderate LVH, EF 57-32, grade 1 diastolic dysfunction, calcified aortic valve, mild MR, moderate LAE  . History of nuclear stress test    Myoview 3/18: Mod size and intensity fixed septal defect, may be artifact.Opposite mod size and intensity lat defect, which is reversible and could represent ischemia or possibly artifact (SDS 4). LVEF 71% with normal wall motion. Intermediate risk study. >> images reviewed with Dr. Dorris Carnes - no sig ischemia; med rx   . Hx of cardiovascular stress test    Lexiscan Myoview 6/16:  EF 70%, no scar or ischemia; Low Risk  . Hyperlipidemia   . Hypertension   . Neuropathy   . Orthostatic hypotension 05/28/2017   . Osteoarthritis   . Rheumatoid arthritis ([facility])    RA (Dr. Ouida Sills) Bilateral hands  . S/P angioplasty with stent 05/27/17 to LCX with DES  05/28/2017  . Thyroid disease    Allergies  Allergen Reactions  . Oxycodone-Acetaminophen Hives and Itching    hallucinations  . Percocet [Oxycodone-Acetaminophen] Hives, Itching and Other (See Comments)    hallucinations  . Pravastatin Sodium Other (See Comments)    Muscle aches Muscle aches  . Hydrocodone Other (See Comments)    "crazy dreams"  . Aspirin Other (See Comments)    GI upset Other reaction(s): Other (See Comments) REACTION: GI upset    Social History   Socioeconomic History  . Marital status: Divorced    Spouse name: Not on file  . Number of children: 3  . Years of education: 28  . Highest education level: Not on file  Occupational History  . Occupation: Retired    Fish farm manager: RETIRED  Tobacco Use  .  Smoking status: Current Every Day Smoker    Packs/day: 0.20    Years: 15.00    Pack years: 3.00    Types: Cigarettes  . Smokeless tobacco: Never Used  Vaping Use  . Vaping Use: Never used  Substance and Sexual Activity  . Alcohol use: No    Alcohol/week: 0.0 standard drinks  . Drug use: No  . Sexual activity: Not on file  Other Topics Concern  . Not on file  Social History Narrative   Single, dgtr lives with her   Current on/off smoker   Alcohol use- no   Drug use-no   Regular Exercise-yes   Social Determinants of Health   Financial Resource Strain: Low Risk   . Difficulty of Paying Living Expenses: Not hard at all  Food Insecurity: No Food Insecurity  . Worried About Charity fundraiser in the Last Year: Never true  . Ran Out of Food in the Last Year: Never true  Transportation Needs: No Transportation Needs  . Lack of Transportation (Medical): No  . Lack of Transportation (Non-Medical): No  Physical Activity: Inactive  . Days of Exercise per Week: 0 days  . Minutes of Exercise per Session: 0 min   Stress: No Stress Concern Present  . Feeling of Stress : Not at all  Social Connections: Moderately Isolated  . Frequency of Communication with Friends and Family: More than three times a week  . Frequency of Social Gatherings with Friends and Family: More than three times a week  . Attends Religious Services: More than 4 times per year  . Active Member of Clubs or Organizations: No  . Attends Archivist Meetings: Never  . Marital Status: Divorced    Vitals:   11/26/20 1505  BP: (!) 148/88  Pulse: 77  Resp: 16  Temp: 98.2 F (36.8 C)  SpO2: 99%   Body mass index is 36.51 kg/m.  Physical Exam Vitals and nursing note reviewed.  Constitutional:      General: She is not in acute distress.    Appearance: She is well-developed. She is not ill-appearing.  HENT:     Head: Normocephalic and atraumatic.     Right Ear: Tympanic membrane, ear canal and external ear normal.     Left Ear: Tympanic membrane, ear canal and external ear normal.     Nose: Rhinorrhea present.     Right Turbinates: Enlarged.     Left Turbinates: Enlarged.     Right Sinus: Maxillary sinus tenderness present. No frontal sinus tenderness.     Left Sinus: Maxillary sinus tenderness present. No frontal sinus tenderness.     Mouth/Throat:     Mouth: Mucous membranes are moist.  Eyes:     Conjunctiva/sclera: Conjunctivae normal.     Pupils: Pupils are equal, round, and reactive to light.  Cardiovascular:     Rate and Rhythm: Normal rate and regular rhythm.     Heart sounds: No murmur heard.   Pulmonary:     Effort: Pulmonary effort is normal. No respiratory distress.     Breath sounds: Rales (Fine rales bilateral.) present. No wheezing or rhonchi.  Musculoskeletal:     Cervical back: No edema or erythema. No muscular tenderness.  Lymphadenopathy:     Cervical: No cervical adenopathy.  Skin:    General: Skin is warm.     Findings: No erythema or rash.  Neurological:     General: No focal  deficit present.     Mental Status: She is  alert and oriented to person, place, and time.     Deep Tendon Reflexes:     Reflex Scores:      Patellar reflexes are 2+ on the right side and 2+ on the left side. Psychiatric:        Speech: Speech normal.     Comments: Well groomed, good eye contact.   ASSESSMENT AND PLAN:  Ms.Laiya was seen today for headache and dizziness.  Diagnoses and all orders for this visit:  Lightheadedness We discussed possible etiologies. Hx and examination today do not suggest a serious process at this time. Fall precautions discussed. I do not think further work-up is needed today. Instructed about warning signs.  Acute non-recurrent maxillary sinusitis I think she can hold on abx. We discussed possible etiologies. If symptoms are not any better in 3-4 days, she can go ahead and start oral abx, some side effects discussed.  -     amoxicillin-clavulanate (AUGMENTIN) 875-125 MG tablet; Take 1 tablet by mouth 2 (two) times daily for 7 days.  Seasonal allergic rhinitis due to pollen Continue Flonase nasla spray. Nasal saline irrigations as needed. Singulair 10 mg at bedtime added today. -     montelukast (SINGULAIR) 10 MG tablet; Take 1 tablet (10 mg total) by mouth at bedtime.  Bronchiectasis without complication ([place]) This problem could be contributing to some of her respiratory symptoms as well as COPD. We reviewed chest CT report, educated about Dx,prognosismand treatment. Continue Albuterol inh 2 puff q 406 hours prn, because of cost she doe snot want new inhaler added today. Appt with pulmonologist will be arranged.  Sleep apnea, unspecified type Could be contributing to her chronic fatigue. Pulmonology referral placed.   Return if symptoms worsen or fail to improve, for Keep next appt..   Elchonon Maxson G. [name], MD  [facility]. [place] office.   A few things to remember from today's visit:  Lightheadedness  Acute non-recurrent  maxillary sinusitis  Seasonal allergic rhinitis due to pollen  If you need refills please call your pharmacy. Do not use My Chart to request refills or for acute issues that need immediate attention.   Nasal saline irrigation as needed. Continue Flonase nasal spray and Claritin. Singulair 10 mg at bedtime added today. If face pain is not any better start antibiotic.  Pulmonology appt will be arranged.  Please be sure medication list is accurate. If a new problem present, please set up appointment sooner than planned today.

## 2020-11-28 ENCOUNTER — Other Ambulatory Visit (HOSPITAL_BASED_OUTPATIENT_CLINIC_OR_DEPARTMENT_OTHER): Payer: Self-pay

## 2020-12-01 ENCOUNTER — Ambulatory Visit: Payer: Medicare HMO

## 2020-12-01 ENCOUNTER — Other Ambulatory Visit (HOSPITAL_BASED_OUTPATIENT_CLINIC_OR_DEPARTMENT_OTHER): Payer: Self-pay

## 2020-12-08 ENCOUNTER — Telehealth: Payer: Self-pay | Admitting: Family Medicine

## 2020-12-08 NOTE — Telephone Encounter (Signed)
Patient is calling and stated that she is still experiencing sinus issues and is requesting a referral to see an ENT, please advise. CB is [phone]

## 2020-12-08 NOTE — Addendum Note (Signed)
Addended by: Rodrigo Ran on: 12/08/2020 04:21 PM   Modules accepted: Orders

## 2020-12-08 NOTE — Telephone Encounter (Signed)
The referral has been placed, they will call pt to schedule.

## 2020-12-10 ENCOUNTER — Ambulatory Visit: Payer: Medicare HMO

## 2020-12-16 DIAGNOSIS — J320 Chronic maxillary sinusitis: Secondary | ICD-10-CM | POA: Diagnosis not present

## 2020-12-16 DIAGNOSIS — R519 Headache, unspecified: Secondary | ICD-10-CM | POA: Diagnosis not present

## 2020-12-17 ENCOUNTER — Other Ambulatory Visit: Payer: Self-pay

## 2020-12-17 ENCOUNTER — Other Ambulatory Visit (HOSPITAL_BASED_OUTPATIENT_CLINIC_OR_DEPARTMENT_OTHER): Payer: Self-pay

## 2020-12-17 ENCOUNTER — Ambulatory Visit: Payer: Medicare HMO | Attending: Internal Medicine

## 2020-12-17 DIAGNOSIS — Z23 Encounter for immunization: Secondary | ICD-10-CM

## 2020-12-17 MED ORDER — PFIZER-BIONT COVID-19 VAC-TRIS 30 MCG/0.3ML IM SUSP
INTRAMUSCULAR | 0 refills | Status: DC
  Filled 2020-12-17: qty 0.3, 1d supply, fill #0

## 2020-12-17 NOTE — Progress Notes (Signed)
   Covid-19 Vaccination Clinic  Name:  [name]    MRN: [id number] DOB: 1946/03/07  12/17/2020  [name] was observed post Covid-19 immunization for 15 minutes without incident. She was provided with Vaccine Information Sheet and instruction to access the V-Safe system.   [name] was instructed to call 911 with any severe reactions post vaccine: [name] Kitchen Difficulty breathing  . Swelling of face and throat  . A fast heartbeat  . A bad rash all over body  . Dizziness and weakness   Immunizations Administered    Name Date Dose VIS Date Route   PFIZER Comrnaty(Gray TOP) Covid-19 Vaccine 12/17/2020  3:13 PM 0.3 mL 07/10/2020 Intramuscular   Manufacturer: [employer], [employer]   Lot: [id number]   NDC: [phone]

## 2020-12-31 NOTE — Progress Notes (Signed)
01/01/21- 75 yoF Smoker (1 pk/ week, 15 pkyrs)for sleep evaluation with concern of OSA and Bronchiectasis courtesy of Dr [name] Medical problem list includes Orthostasis, HTN, CAD/ stent, Allergic Rhinitis, GERD,  Esophageal Stricture, DM2,/ Neuropathy, Hyperlipidemia,  Rheumatoid Arthritis, Hx Renal Cancer/R Nephrectomy, CKD3, Tobacco user,  Spinal Stenosis Lumbar,  -Ventolin hfa, Singulair, Flonase, NPSG 10/07/18- AHI 65.8/ hr, desaturation to 89%, body weight 209 lbs Epworth score-7 Body weight today-226 lbs Covid vax- -----Pt is being referred by PCP due to sleep apnea and also due to CT scan and chest xray that she had done. Pt states that she does snore. Pt also has had a cough that is worse throughout the day and states she will occ cough up phlegm.  Daughter here. Son Creedence Heiss is our pt. Loud snoring. Witnessed apneas. Nonrefreshing sleep. Daytime tiredness. Denies ENT surgery, but has seen ENT for sinus congestion and dizziness w f/u pending.  Breathing- +shortness of breath mainly w exertion, productive cough. CT chest showed mild bronchiectasis and emphysema- discussed. Hx GERD but not recognizing significant aspiration event. CT chest 4/20/222-  IMPRESSION: 1. No acute findings in the chest. 2. Minimal emphysematous changes at the right lung apex. 3. Mild cylindrical bronchiectasis within the right middle lobe and bilateral lower lobes, which may be post infectious or post inflammatory. 4. Stable 3.0 cm left adrenal adenoma. Aortic Atherosclerosis (ICD10-I70.0) and Emphysema (ICD10-J43.9).  Prior to Admission medications   Medication Sig Start Date End Date Taking? Authorizing Provider  acetaminophen (TYLENOL) 500 MG tablet Take 1,000 mg by mouth as needed for moderate pain or headache.    Yes [provider]  albuterol (VENTOLIN HFA) 108 (90 Base) MCG/ACT inhaler INHALE ONE OR TWO PUFFS BY MOUTH INTO THE LUNGS EVERY SIX HOURS AS NEEDED FOR WHEEZING OR SHORTNESS  OF BREATH 11/21/20  Yes [name], [name], MD  Alirocumab (PRALUENT) 75 MG/ML SOAJ Inject 75 mg into the skin every 14 (fourteen) days. 06/10/20  Yes Fay Records, MD  aspirin 81 MG chewable tablet Chew 81 mg by mouth daily.   Yes [provider]  clopidogrel (PLAVIX) 75 MG tablet Take 75 mg by mouth daily. 06/28/20  Yes [provider]  colchicine 0.6 MG tablet Take 0.5 tablets (0.3 mg total) by mouth daily. 06/19/19  Yes Deveshwar, Abel Presto, MD  DULoxetine (CYMBALTA) 30 MG capsule Take 1 capsule (30 mg total) by mouth daily. 07/07/20  Yes [name], [name], MD  ezetimibe (ZETIA) 10 MG tablet Take 1 tablet (10 mg total) by mouth daily. 04/09/20  Yes Fay Records, MD  fluticasone (FLONASE) 50 MCG/ACT nasal spray PLACE 2 SPRAYS INTO BOTH NOSTRILS AT BEDTIME AS NEEDED FOR ALLERGIES. 11/21/20  Yes [name], [name], MD  Fluticasone-Umeclidin-Vilant (TRELEGY ELLIPTA) 100-62.5-25 MCG/INH AEPB Inhale 1 puff into the lungs daily. 01/01/21  Yes Jensyn Cambria, Tarri Fuller D, MD  gabapentin (NEURONTIN) 100 MG capsule Take 1 capsule (100 mg total) by mouth 3 (three) times daily. 03/03/20  Yes Ward Givens, NP  gabapentin (NEURONTIN) 400 MG capsule Take 1 capsule (400 mg total) by mouth 2 (two) times daily. 03/03/20  Yes Ward Givens, NP  losartan (COZAAR) 100 MG tablet TAKE ONE TABLET BY MOUTH ONE TIME DAILY 11/21/20  Yes [name], [name], MD  metoprolol succinate (TOPROL-XL) 100 MG 24 hr tablet Take 1 tablet (100 mg total) by mouth daily. Take with or immediately following a meal. 11/03/20  Yes [name], [name], MD  montelukast (SINGULAIR) 10 MG tablet Take 1 tablet (10  mg total) by mouth at bedtime. 11/26/20  Yes [name], [name], MD  MYRBETRIQ 25 MG TB24 tablet Take 25 mg by mouth daily. 04/22/20  Yes [provider]  nitroGLYCERIN (NITROSTAT) 0.4 MG SL tablet Place 1 tablet (0.4 mg total) under the tongue every 5 (five) minutes as needed for chest pain. 07/09/19  Yes Fay Records, MD  pantoprazole (PROTONIX) 40  MG tablet Take 1 tablet (40 mg total) by mouth daily. 09/01/20 09/01/21 Yes Pahwani, Einar Grad, MD  polyethylene glycol powder (GLYCOLAX/MIRALAX) powder Take 17 g by mouth daily. 05/03/17  Yes Levin Erp, PA  rosuvastatin (CRESTOR) 20 MG tablet Take 1 tablet (20 mg total) by mouth daily. 06/10/20  Yes Fay Records, MD  triamterene-hydrochlorothiazide [facility]) 37.5-25 MG tablet TAKE HALF TABLET BY MOUTH DAILY 07/01/20  Yes Fay Records, MD   Past Medical History:  Diagnosis Date   Allergy    Anxiety    C. difficile diarrhea    history of   CAD (coronary artery disease), native coronary artery    a. 05/2017 showed 100% mLCx tx with DES, otherwise 20% mRCA, 50% ramus, 30% dLAD, 20% prox-mid LAD, 20% mLAD treated medically.    Cancer [facility])    Right kidney CA removed.    CKD (chronic kidney disease) stage 3, GFR 30-59 ml/min ([place]) 10/11/2016   s/p R nephrectomy   Colon polyps 2008   HYPERPLASTIC   Diabetes mellitus without complication ([place])    type 2  no meds   Gastritis    GERD (gastroesophageal reflux disease)    History of echocardiogram    Echo 3/18:  Moderate LVH, EF 94-49, grade 1 diastolic dysfunction, calcified aortic valve, mild MR, moderate LAE   History of nuclear stress test    Myoview 3/18: Mod size and intensity fixed septal defect, may be artifact.Opposite mod size and intensity lat defect, which is reversible and could represent ischemia or possibly artifact (SDS 4). LVEF 71% with normal wall motion. Intermediate risk study. >> images reviewed with Dr. Dorris Carnes - no sig ischemia; med rx    Hx of cardiovascular stress test    Lexiscan Myoview 6/16:  EF 70%, no scar or ischemia; Low Risk   Hyperlipidemia    Hypertension    Neuropathy    Orthostatic hypotension 05/28/2017   Osteoarthritis    Rheumatoid arthritis ([place])    RA (Dr. Ouida Sills) Bilateral hands   S/P angioplasty with stent 05/27/17 to LCX with DES  05/28/2017   Thyroid disease    Past Surgical  History:  Procedure Laterality Date   ABDOMINAL HYSTERECTOMY  1978   ANTERIOR CERVICAL DECOMP/DISCECTOMY FUSION N/A 05/29/2018   Procedure: ANTERIOR CERVICAL DECOMPRESSION FUSION - CERVICAL FIVE-CERVICAL SIX - CERVICAL SIX-CERVICAL SEVEN;  Surgeon: Earnie Larsson, MD;  Location: [place];  Service: Neurosurgery;  Laterality: N/A;   BACK SURGERY     x 2   CARPAL TUNNEL RELEASE Left    COLONOSCOPY W/ BIOPSIES AND POLYPECTOMY     Hx: of   CORONARY STENT INTERVENTION N/A 05/27/2017   Procedure: CORONARY STENT INTERVENTION;  Surgeon: Burnell Blanks, MD;  Location: [place] [facility];  Service: Cardiovascular;  Laterality: N/A;   ESOPHAGOGASTRODUODENOSCOPY     HNP     LEFT HEART CATH AND CORONARY ANGIOGRAPHY N/A 05/27/2017   Procedure: LEFT HEART CATH AND CORONARY ANGIOGRAPHY;  Surgeon: Burnell Blanks, MD;  Location: [place] [facility];  Service: Cardiovascular;  Laterality: N/A;   LUMBAR LAMINECTOMY/DECOMPRESSION MICRODISCECTOMY  Left 02/12/2013   Procedure: LUMBAR TWO THREE, LUMBAR THREE FOUR, LUMBAR FOUR FIVE  LAMINECTOMY/DECOMPRESSION MICRODISCECTOMY 3 LEVELS;  Surgeon: Charlie Pitter, MD;  Location: [place] [facility];  Service: Neurosurgery;  Laterality: Left;   NEPHRECTOMY Right 2010   10.rcc cancer   TOTAL KNEE ARTHROPLASTY Right    Redo   Family History  Problem Relation Age of Onset   Rheum arthritis Mother    Stroke Mother    Prostate cancer Father    Heart disease Father    Diabetes Brother    Dementia Brother    Parkinsonism Brother    Kidney disease Son    Diabetes Brother    Cancer Brother    Dementia Brother    Colon cancer Neg Hx    Esophageal cancer Neg Hx    Pancreatic cancer Neg Hx    Liver disease Neg Hx    Rectal cancer Neg Hx    Stomach cancer Neg Hx    Social History   Socioeconomic History   Marital status: Divorced    Spouse name: Not on file   Number of children: 3   Years of education: 12   Highest education level: Not on file  Occupational  History   Occupation: Retired    Fish farm manager: RETIRED  Tobacco Use   Smoking status: Every Day    Packs/day: 0.20    Years: 15.00    Pack years: 3.00    Types: Cigarettes    Start date: 1970   Smokeless tobacco: Never   Tobacco comments:    pt has been an on and off smoker since age 43  Vaping Use   Vaping Use: Never used  Substance and Sexual Activity   Alcohol use: No    Alcohol/week: 0.0 standard drinks   Drug use: No   Sexual activity: Not on file  Other Topics Concern   Not on file  Social History Narrative   Single, dgtr lives with her   Current on/off smoker   Alcohol use- no   Drug use-no   Regular Exercise-yes   Social Determinants of Health   Financial Resource Strain: Low Risk    Difficulty of Paying Living Expenses: Not hard at all  Food Insecurity: No Food Insecurity   Worried About Charity fundraiser in the Last Year: Never true   Ran Out of Food in the Last Year: Never true  Transportation Needs: No Transportation Needs   Lack of Transportation (Medical): No   Lack of Transportation (Non-Medical): No  Physical Activity: Inactive   Days of Exercise per Week: 0 days   Minutes of Exercise per Session: 0 min  Stress: No Stress Concern Present   Feeling of Stress : Not at all  Social Connections: Moderately Isolated   Frequency of Communication with Friends and Family: More than three times a week   Frequency of Social Gatherings with Friends and Family: More than three times a week   Attends Religious Services: More than 4 times per year   Active Member of [employer] or Organizations: No   Attends Archivist Meetings: Never   Marital Status: Divorced  Human resources officer Violence: Not At Risk   Fear of Current or Ex-Partner: No   Emotionally Abused: No   Physically Abused: No   Sexually Abused: No   ROS-see HPI   + = positive Constitutional:    weight loss, night sweats, fevers, chills, fatigue, lassitude. HEENT:    headaches, +difficulty  swallowing, tooth/dental problems, sore  throat,       sneezing, itching, ear ache, +nasal congestion, post nasal drip, snoring CV:    chest pain, orthopnea, PND, swelling in lower extremities, anasarca,      dizziness, palpitations Resp:   +shortness of breath with exertion or at rest.                +productive cough,   non-productive cough, coughing up of blood.              change in color of mucus.  wheezing.   Skin:    rash or lesions. GI:  No-   heartburn, indigestion, abdominal pain, nausea, vomiting, diarrhea,                 change in bowel habits, loss of appetite GU: dysuria, change in color of urine, no urgency or frequency.   flank pain. MS:   +joint pain, stiffness, decreased range of motion, back pain. Neuro-     +dizzy Psych:  change in mood or affect.  depression or anxiety.   memory loss.  OBJ- Physical Exam General- Alert, Oriented, Affect-appropriate, Distress- none acute, = obese Skin- rash-none, lesions- none, excoriation- none Lymphadenopathy- none Head- atraumatic            Eyes- Gross vision intact, PERRLA, conjunctivae and secretions clear            Ears- Hearing, canals-normal            Nose- Clear, no-Septal dev, mucus, polyps, erosion, perforation             Throat- Mallampati II I, mucosa clear , drainage- none, t+tonsils, + teeth Neck- flexible , trachea midline, no stridor , thyroid nl, carotid no bruit Chest - symmetrical excursion , unlabored           Heart/CV- RRR , no murmur , no gallop  , no rub, nl s1 s2                           - JVD- none , edema- none, stasis changes- none, varices- none           Lung- +coarse, wheeze- none, cough- none , dullness-none, rub- none           Chest wall-  Abd-  Br/ Gen/ Rectal- Not done, not indicated Extrem- cyanosis- none, clubbing, none, atrophy- none, strength- nl Neuro- grossly intact to observation

## 2021-01-01 ENCOUNTER — Other Ambulatory Visit: Payer: Self-pay

## 2021-01-01 ENCOUNTER — Encounter: Payer: Self-pay | Admitting: Internal Medicine

## 2021-01-01 ENCOUNTER — Ambulatory Visit (INDEPENDENT_AMBULATORY_CARE_PROVIDER_SITE_OTHER): Payer: Medicare HMO | Admitting: Internal Medicine

## 2021-01-01 VITALS — BP 128/74 | HR 73 | Ht 66.0 in | Wt 226.0 lb

## 2021-01-01 DIAGNOSIS — K219 Gastro-esophageal reflux disease without esophagitis: Secondary | ICD-10-CM

## 2021-01-01 DIAGNOSIS — F17200 Nicotine dependence, unspecified, uncomplicated: Secondary | ICD-10-CM

## 2021-01-01 DIAGNOSIS — R69 Illness, unspecified: Secondary | ICD-10-CM | POA: Diagnosis not present

## 2021-01-01 DIAGNOSIS — G4733 Obstructive sleep apnea (adult) (pediatric): Secondary | ICD-10-CM

## 2021-01-01 DIAGNOSIS — J479 Bronchiectasis, uncomplicated: Secondary | ICD-10-CM

## 2021-01-01 DIAGNOSIS — J449 Chronic obstructive pulmonary disease, unspecified: Secondary | ICD-10-CM | POA: Diagnosis not present

## 2021-01-01 MED ORDER — TRELEGY ELLIPTA 100-62.5-25 MCG/INH IN AEPB: 1.0000 | INHALATION_SPRAY | Freq: Every day | RESPIRATORY_TRACT | 0 refills | Status: DC

## 2021-01-01 NOTE — Patient Instructions (Signed)
Order- Schedule home sleep test dx OSA  Please call us for results and recommendations about 2 weeks after your sleep test   Order- Schedule PFT     Dx Bronchiectasis w/o exacerbation  Order- sample x 2 Trelegy 100   Inhale 1 puff, then rinse mouth,once daily   See if this helps your breathing. You can still use the Ventolin rescue inhaler if needed as before.

## 2021-01-06 DIAGNOSIS — J320 Chronic maxillary sinusitis: Secondary | ICD-10-CM | POA: Diagnosis not present

## 2021-01-06 DIAGNOSIS — R519 Headache, unspecified: Secondary | ICD-10-CM | POA: Diagnosis not present

## 2021-01-06 DIAGNOSIS — J321 Chronic frontal sinusitis: Secondary | ICD-10-CM | POA: Diagnosis not present

## 2021-01-08 ENCOUNTER — Ambulatory Visit (INDEPENDENT_AMBULATORY_CARE_PROVIDER_SITE_OTHER): Payer: Medicare HMO | Admitting: Otolaryngology

## 2021-01-12 ENCOUNTER — Encounter: Payer: Self-pay | Admitting: Family Medicine

## 2021-01-12 DIAGNOSIS — Z1231 Encounter for screening mammogram for malignant neoplasm of breast: Secondary | ICD-10-CM | POA: Diagnosis not present

## 2021-01-13 LAB — HM MAMMOGRAPHY

## 2021-01-16 ENCOUNTER — Encounter: Payer: Self-pay | Admitting: Internal Medicine

## 2021-01-16 DIAGNOSIS — G4733 Obstructive sleep apnea (adult) (pediatric): Secondary | ICD-10-CM | POA: Insufficient documentation

## 2021-01-16 DIAGNOSIS — J449 Chronic obstructive pulmonary disease, unspecified: Secondary | ICD-10-CM | POA: Insufficient documentation

## 2021-01-16 HISTORY — DX: Obstructive sleep apnea (adult) (pediatric): G47.33

## 2021-01-16 NOTE — Assessment & Plan Note (Signed)
We will need to update sleep study before insurance will act on it. Appropriate discussion. Plan- home sleep test, then probably CPAP if appropriate

## 2021-01-16 NOTE — Assessment & Plan Note (Signed)
Smoker with CT evidence of bronchiectasis and emphysema. Possible role of microaspiration. Plan- PFT, sample Trelegy 100 for trial

## 2021-01-16 NOTE — Assessment & Plan Note (Signed)
Be careful about reflux precautions

## 2021-01-16 NOTE — Assessment & Plan Note (Signed)
Emphasize importance of total smoking cessation

## 2021-01-25 ENCOUNTER — Emergency Department (HOSPITAL_COMMUNITY)
Admission: EM | Admit: 2021-01-25 | Discharge: 2021-01-25 | Disposition: A | Discharge status: Home / Self Care | Payer: Medicare HMO | Attending: Emergency Medicine | Admitting: Emergency Medicine

## 2021-01-25 ENCOUNTER — Other Ambulatory Visit: Payer: Self-pay

## 2021-01-25 ENCOUNTER — Emergency Department (HOSPITAL_COMMUNITY): Payer: Medicare HMO

## 2021-01-25 ENCOUNTER — Encounter (HOSPITAL_COMMUNITY): Payer: Self-pay

## 2021-01-25 ENCOUNTER — Encounter (HOSPITAL_COMMUNITY): Payer: Self-pay | Admitting: *Deleted

## 2021-01-25 ENCOUNTER — Ambulatory Visit (HOSPITAL_COMMUNITY)
Admission: EM | Admit: 2021-01-25 | Discharge: 2021-01-25 | Disposition: A | Discharge status: Home / Self Care | Payer: Medicare HMO

## 2021-01-25 DIAGNOSIS — Z85528 Personal history of other malignant neoplasm of kidney: Secondary | ICD-10-CM | POA: Insufficient documentation

## 2021-01-25 DIAGNOSIS — Z96651 Presence of right artificial knee joint: Secondary | ICD-10-CM | POA: Diagnosis not present

## 2021-01-25 DIAGNOSIS — F17210 Nicotine dependence, cigarettes, uncomplicated: Secondary | ICD-10-CM | POA: Diagnosis not present

## 2021-01-25 DIAGNOSIS — Z79899 Other long term (current) drug therapy: Secondary | ICD-10-CM | POA: Diagnosis not present

## 2021-01-25 DIAGNOSIS — Z7982 Long term (current) use of aspirin: Secondary | ICD-10-CM | POA: Insufficient documentation

## 2021-01-25 DIAGNOSIS — R42 Dizziness and giddiness: Secondary | ICD-10-CM

## 2021-01-25 DIAGNOSIS — R001 Bradycardia, unspecified: Secondary | ICD-10-CM | POA: Insufficient documentation

## 2021-01-25 DIAGNOSIS — J449 Chronic obstructive pulmonary disease, unspecified: Secondary | ICD-10-CM | POA: Diagnosis not present

## 2021-01-25 DIAGNOSIS — N183 Chronic kidney disease, stage 3 unspecified: Secondary | ICD-10-CM | POA: Insufficient documentation

## 2021-01-25 DIAGNOSIS — Z955 Presence of coronary angioplasty implant and graft: Secondary | ICD-10-CM | POA: Insufficient documentation

## 2021-01-25 DIAGNOSIS — I2510 Atherosclerotic heart disease of native coronary artery without angina pectoris: Secondary | ICD-10-CM | POA: Insufficient documentation

## 2021-01-25 DIAGNOSIS — Z7902 Long term (current) use of antithrombotics/antiplatelets: Secondary | ICD-10-CM | POA: Diagnosis not present

## 2021-01-25 DIAGNOSIS — E1122 Type 2 diabetes mellitus with diabetic chronic kidney disease: Secondary | ICD-10-CM | POA: Diagnosis not present

## 2021-01-25 DIAGNOSIS — I129 Hypertensive chronic kidney disease with stage 1 through stage 4 chronic kidney disease, or unspecified chronic kidney disease: Secondary | ICD-10-CM | POA: Insufficient documentation

## 2021-01-25 DIAGNOSIS — R519 Headache, unspecified: Secondary | ICD-10-CM | POA: Insufficient documentation

## 2021-01-25 DIAGNOSIS — Z7951 Long term (current) use of inhaled steroids: Secondary | ICD-10-CM | POA: Insufficient documentation

## 2021-01-25 LAB — COMPREHENSIVE METABOLIC PANEL
ALT: 14 U/L (ref 0–44)
AST: 21 U/L (ref 15–41)
Albumin: 3.5 g/dL (ref 3.5–5.0)
Alkaline Phosphatase: 59 U/L (ref 38–126)
Anion gap: 7 (ref 5–15)
BUN: 19 mg/dL (ref 8–23)
CO2: 24 mmol/L (ref 22–32)
Calcium: 9.3 mg/dL (ref 8.9–10.3)
Chloride: 106 mmol/L (ref 98–111)
Creatinine, Ser: 1.37 mg/dL — ABNORMAL HIGH (ref 0.44–1.00)
GFR, Estimated: 40 mL/min — ABNORMAL LOW (ref >60)
Glucose, Bld: 92 mg/dL (ref 70–99)
Potassium: 4 mmol/L (ref 3.5–5.1)
Sodium: 137 mmol/L (ref 135–145)
Total Bilirubin: 0.6 mg/dL (ref 0.3–1.2)
Total Protein: 7.7 g/dL (ref 6.5–8.1)

## 2021-01-25 LAB — DIFFERENTIAL
Abs Immature Granulocytes: 0.06 10*3/uL (ref 0.00–0.07)
Basophils Absolute: 0.1 10*3/uL (ref 0.0–0.1)
Basophils Relative: 1 %
Eosinophils Absolute: 0.2 10*3/uL (ref 0.0–0.5)
Eosinophils Relative: 3 %
Immature Granulocytes: 1 %
Lymphocytes Relative: 40 %
Lymphs Abs: 3.1 10*3/uL (ref 0.7–4.0)
Monocytes Absolute: 0.6 10*3/uL (ref 0.1–1.0)
Monocytes Relative: 8 %
Neutro Abs: 3.6 10*3/uL (ref 1.7–7.7)
Neutrophils Relative %: 47 %

## 2021-01-25 LAB — CBC
HCT: 45.4 % (ref 36.0–46.0)
Hemoglobin: 15.1 g/dL — ABNORMAL HIGH (ref 12.0–15.0)
MCH: 30.7 pg (ref 26.0–34.0)
MCHC: 33.3 g/dL (ref 30.0–36.0)
MCV: 92.3 fL (ref 80.0–100.0)
Platelets: 260 10*3/uL (ref 150–400)
RBC: 4.92 MIL/uL (ref 3.87–5.11)
RDW: 15.1 % (ref 11.5–15.5)
WBC: 7.6 10*3/uL (ref 4.0–10.5)
nRBC: 0 % (ref 0.0–0.2)

## 2021-01-25 LAB — PROTIME-INR
INR: 1 (ref 0.8–1.2)
Prothrombin Time: 12.8 seconds (ref 11.4–15.2)

## 2021-01-25 LAB — APTT: aPTT: 28 seconds (ref 24–36)

## 2021-01-25 MED ORDER — SODIUM CHLORIDE 0.9% FLUSH: 3.0000 mL | Freq: Once | INTRAVENOUS | Status: DC

## 2021-01-25 NOTE — ED Notes (Signed)
Patient is being discharged from the Urgent Care and sent to the Emergency Department via POV . Per Caroll Rancher NP, patient is in need of higher level of care due to headache with report of high blood pressure (since resolved) and reported left facial droop this morning, with pt report of remaining sense of left facial numbness. Patient is aware and verbalizes understanding of plan of care.  Vitals:   01/25/21 1220  BP: 127/67  Pulse: (!) 58  Resp: 18  Temp: 98.1 F (36.7 C)  SpO2: 98%

## 2021-01-25 NOTE — ED Provider Notes (Signed)
[facility] EMERGENCY DEPARTMENT Provider Note   CSN: [id number] Arrival date & time: 01/25/21  1240     History Chief Complaint  Patient presents with   Headache    [name] is a 75 y.o. female with past medical history of CAD, hypertension, diabetes (no medications), GERD who presented today for evaluation of headache.  Patient reports that she woke up this morning with a left-sided headache.  She states she felt like the left side of her face was puffy.  Daughter felt like it was slightly drooping and felt like patient was having some difficulty speaking.  Patient did not appreciate any difficulty speaking.  She states that she took her blood pressure at home and noted it to be high.  This was before she took her blood pressure medications.  She took her blood pressure medications and a few minutes later, checked her blood pressure again and it was still high so she went to the urgent care.  At urgent care, her blood pressure improved but she was sent to the ED for further evaluation.  At this time in the ED, patient states that her headache is gone.  She denies any new symptoms at this time.  She does report that she has felt dizzy.  She states that this is been ongoing for about a month.  She states that she has seen ear nose and throat doctor and was told that she had sinus issues and they felt like that was contributing to her dizziness.  She states that today's dizziness feels similar to the dizziness that she has had over the last month and does not have any new or different symptoms.  She states she feels it when she mostly walks.  Currently, she denies any vision changes, headache, numbness/weakness of her arms or legs, fevers, chest pain, difficulty breathing, difficulty speaking.  The history is provided by the patient.      Past Medical History:  Diagnosis Date   Allergy    Anxiety    C. difficile diarrhea    history of   CAD (coronary artery disease),  native coronary artery    a. 05/2017 showed 100% mLCx tx with DES, otherwise 20% mRCA, 50% ramus, 30% dLAD, 20% prox-mid LAD, 20% mLAD treated medically.    Cancer [facility])    Right kidney CA removed.    CKD (chronic kidney disease) stage 3, GFR 30-59 ml/min ([place]) 10/11/2016   s/p R nephrectomy   Colon polyps 2008   HYPERPLASTIC   Diabetes mellitus without complication ([place])    type 2  no meds   Gastritis    GERD (gastroesophageal reflux disease)    History of echocardiogram    Echo 3/18:  Moderate LVH, EF 50-53, grade 1 diastolic dysfunction, calcified aortic valve, mild MR, moderate LAE   History of nuclear stress test    Myoview 3/18: Mod size and intensity fixed septal defect, may be artifact.Opposite mod size and intensity lat defect, which is reversible and could represent ischemia or possibly artifact (SDS 4). LVEF 71% with normal wall motion. Intermediate risk study. >> images reviewed with Dr. Dorris Carnes - no sig ischemia; med rx    Hx of cardiovascular stress test    Lexiscan Myoview 6/16:  EF 70%, no scar or ischemia; Low Risk   Hyperlipidemia    Hypertension    Neuropathy    Orthostatic hypotension 05/28/2017   OSA (obstructive sleep apnea) 01/16/2021   Osteoarthritis    Rheumatoid  arthritis ([place])    RA (Dr. Ouida Sills) Bilateral hands   S/P angioplasty with stent 05/27/17 to LCX with DES  05/28/2017   Thyroid disease     Patient Active Problem List   Diagnosis Date Noted   OSA (obstructive sleep apnea) 01/16/2021   COPD mixed type ([place]) 01/16/2021   Fatigue 11/03/2020   B12 deficiency 11/03/2020   Chest pain 08/30/2020   Malignant neoplasm of kidney excluding renal pelvis, unspecified laterality ([place]) 09/26/2019   Vitamin D deficiency, unspecified 09/26/2019   Anxiety disorder, unspecified 03/26/2019   Tobacco use disorder 09/29/2018   Cervical myelopathy ([place]) 05/29/2018   ANA positive 12/08/2017   Cervicalgia 09/16/2017   Carpal tunnel syndrome of left wrist  07/21/2017   Chondrocalcinosis 07/21/2017   Neuropathy 07/21/2017   Orthostatic hypotension 05/28/2017   S/P angioplasty with stent 05/27/17 to LCX with DES  05/28/2017   CAD (coronary artery disease)    Polyneuropathy 05/25/2017   Chest pain with moderate risk for cardiac etiology 05/25/2017   Class 1 obesity with serious comorbidity and body mass index (BMI) of 34.0 to 34.9 in adult 12/06/2016   CKD (chronic kidney disease) stage 3, GFR 30-59 ml/min ([facility]) 10/11/2016   Hot flashes, menopausal 10/01/2016   Chest pain with moderate risk of acute coronary syndrome 10/25/2014   Degenerative spondylolisthesis 10/15/2014   Spinal stenosis, lumbar region, with neurogenic claudication 02/12/2013   DM (diabetes mellitus), type 2 with renal complications ([place]) 29/51/8841   History of Rtr nephrectomy 2010, secondary to renal cell cancer 01/14/2009   PERSONAL HX COLONIC POLYPS 11/14/2008   Hyperlipidemia associated with type 2 diabetes mellitus ([place]) 11/16/2007   ESOPHAGEAL STRICTURE 10/12/2007   Rheumatoid arthritis ([place]) 02/27/2007   Hypertension with heart disease 01/19/2007   Allergic rhinitis 01/19/2007   GERD 01/19/2007    Past Surgical History:  Procedure Laterality Date   ABDOMINAL HYSTERECTOMY  1978   ANTERIOR CERVICAL DECOMP/DISCECTOMY FUSION N/A 05/29/2018   Procedure: ANTERIOR CERVICAL DECOMPRESSION FUSION - CERVICAL FIVE-CERVICAL SIX - CERVICAL SIX-CERVICAL SEVEN;  Surgeon: Earnie Larsson, MD;  Location: [facility];  Service: Neurosurgery;  Laterality: N/A;   BACK SURGERY     x 2   CARPAL TUNNEL RELEASE Left    COLONOSCOPY W/ BIOPSIES AND POLYPECTOMY     Hx: of   CORONARY STENT INTERVENTION N/A 05/27/2017   Procedure: CORONARY STENT INTERVENTION;  Surgeon: Burnell Blanks, MD;  Location: [facility];  Service: Cardiovascular;  Laterality: N/A;   ESOPHAGOGASTRODUODENOSCOPY     HNP     LEFT HEART CATH AND CORONARY ANGIOGRAPHY N/A 05/27/2017   Procedure: LEFT HEART CATH  AND CORONARY ANGIOGRAPHY;  Surgeon: Burnell Blanks, MD;  Location: [facility];  Service: Cardiovascular;  Laterality: N/A;   LUMBAR LAMINECTOMY/DECOMPRESSION MICRODISCECTOMY Left 02/12/2013   Procedure: LUMBAR TWO THREE, LUMBAR THREE FOUR, LUMBAR FOUR FIVE  LAMINECTOMY/DECOMPRESSION MICRODISCECTOMY 3 LEVELS;  Surgeon: Charlie Pitter, MD;  Location: [facility];  Service: Neurosurgery;  Laterality: Left;   NEPHRECTOMY Right 2010   10.rcc cancer   TOTAL KNEE ARTHROPLASTY Right    Redo     OB History   No obstetric history on file.     Family History  Problem Relation Age of Onset   Rheum arthritis Mother    Stroke Mother    Prostate cancer Father    Heart disease Father    Diabetes Brother    Dementia Brother    Parkinsonism Brother    Kidney disease Son  Diabetes Brother    Cancer Brother    Dementia Brother    Colon cancer Neg Hx    Esophageal cancer Neg Hx    Pancreatic cancer Neg Hx    Liver disease Neg Hx    Rectal cancer Neg Hx    Stomach cancer Neg Hx     Social History   Tobacco Use   Smoking status: Every Day    Packs/day: 0.20    Years: 15.00    Pack years: 3.00    Types: Cigarettes    Start date: 1970   Smokeless tobacco: Never   Tobacco comments:    pt has been an on and off smoker since age 7  Vaping Use   Vaping Use: Never used  Substance Use Topics   Alcohol use: No    Alcohol/week: 0.0 standard drinks   Drug use: No    Home Medications Prior to Admission medications   Medication Sig Start Date End Date Taking? Authorizing Provider  acetaminophen (TYLENOL) 500 MG tablet Take 1,000 mg by mouth as needed for moderate pain or headache.     [provider]  albuterol (VENTOLIN HFA) 108 (90 Base) MCG/ACT inhaler INHALE ONE OR TWO PUFFS BY MOUTH INTO THE LUNGS EVERY SIX HOURS AS NEEDED FOR WHEEZING OR SHORTNESS OF BREATH 11/21/20   [name], [name], MD  Alirocumab (PRALUENT) 75 MG/ML SOAJ Inject 75 mg into the skin every 14  (fourteen) days. 06/10/20   Fay Records, MD  aspirin 81 MG chewable tablet Chew 81 mg by mouth daily.    [provider]  clopidogrel (PLAVIX) 75 MG tablet Take 75 mg by mouth daily. 06/28/20   [provider]  colchicine 0.6 MG tablet Take 0.5 tablets (0.3 mg total) by mouth daily. 06/19/19   Bo Merino, MD  DULoxetine (CYMBALTA) 30 MG capsule Take 1 capsule (30 mg total) by mouth daily. 07/07/20   [name], [name], MD  ezetimibe (ZETIA) 10 MG tablet Take 1 tablet (10 mg total) by mouth daily. 04/09/20   Fay Records, MD  fluticasone (FLONASE) 50 MCG/ACT nasal spray PLACE 2 SPRAYS INTO BOTH NOSTRILS AT BEDTIME AS NEEDED FOR ALLERGIES. 11/21/20   [name], [name], MD  Fluticasone-Umeclidin-Vilant (TRELEGY ELLIPTA) 100-62.5-25 MCG/INH AEPB Inhale 1 puff into the lungs daily. 01/01/21   Deneise Lever, MD  gabapentin (NEURONTIN) 100 MG capsule Take 1 capsule (100 mg total) by mouth 3 (three) times daily. 03/03/20   Ward Givens, NP  gabapentin (NEURONTIN) 400 MG capsule Take 1 capsule (400 mg total) by mouth 2 (two) times daily. 03/03/20   Ward Givens, NP  losartan (COZAAR) 100 MG tablet TAKE ONE TABLET BY MOUTH ONE TIME DAILY 11/21/20   [name], [name], MD  metoprolol succinate (TOPROL-XL) 100 MG 24 hr tablet Take 1 tablet (100 mg total) by mouth daily. Take with or immediately following a meal. 11/03/20   [name], [name], MD  montelukast (SINGULAIR) 10 MG tablet Take 1 tablet (10 mg total) by mouth at bedtime. 11/26/20   [name], [name], MD  MYRBETRIQ 25 MG TB24 tablet Take 25 mg by mouth daily. 04/22/20   [provider]  nitroGLYCERIN (NITROSTAT) 0.4 MG SL tablet Place 1 tablet (0.4 mg total) under the tongue every 5 (five) minutes as needed for chest pain. 07/09/19   Fay Records, MD  pantoprazole (PROTONIX) 40 MG tablet Take 1 tablet (40 mg total) by mouth daily. 09/01/20 09/01/21  Darliss Cheney, MD  polyethylene  glycol powder (GLYCOLAX/MIRALAX) powder Take 17 g by mouth  daily. 05/03/17   Levin Erp, PA  rosuvastatin (CRESTOR) 20 MG tablet Take 1 tablet (20 mg total) by mouth daily. 06/10/20   Fay Records, MD  triamterene-hydrochlorothiazide [facility]) 37.5-25 MG tablet TAKE HALF TABLET BY MOUTH DAILY 07/01/20   Fay Records, MD    Allergies    Oxycodone-acetaminophen, Percocet [oxycodone-acetaminophen], Pravastatin sodium, Hydrocodone, and Aspirin  Review of Systems   Review of Systems  Constitutional:  Negative for fever.  Eyes:  Negative for visual disturbance.  Respiratory:  Negative for cough and shortness of breath.   Cardiovascular:  Negative for chest pain.  Gastrointestinal:  Negative for abdominal pain, nausea and vomiting.  Neurological:  Positive for dizziness and headaches (resolved). Negative for syncope, facial asymmetry, speech difficulty and weakness.  All other systems reviewed and are negative.  Physical Exam Updated Vital Signs BP (!) 152/86   Pulse (!) 55   Temp (!) 97.2 F (36.2 C) (Temporal)   Resp (!) 21   SpO2 97%   Physical Exam Vitals and nursing note reviewed.  Constitutional:      Appearance: Normal appearance. She is well-developed.  HENT:     Head: Normocephalic and atraumatic.     Comments: Face is symmetric in appearance without any overlying warmth, erythema, edema. Eyes:     General: Lids are normal.     Conjunctiva/sclera: Conjunctivae normal.     Pupils: Pupils are equal, round, and reactive to light.     Comments: EOMS intact without any nystagmus. PERRL.  Neck:     Comments: Neck is supple and without rigidity. Cardiovascular:     Rate and Rhythm: Normal rate and regular rhythm.     Pulses: Normal pulses.     Heart sounds: Normal heart sounds. No murmur heard.   No friction rub. No gallop.  Pulmonary:     Effort: Pulmonary effort is normal.     Breath sounds: Normal breath sounds.     Comments: Lungs clear to auscultation bilaterally.  Symmetric chest rise.  No wheezing, rales,  rhonchi. Abdominal:     Palpations: Abdomen is soft. Abdomen is not rigid.     Tenderness: There is no abdominal tenderness. There is no guarding.     Comments: Abdomen is soft, non-distended, non-tender. No rigidity, No guarding. No peritoneal signs.  Musculoskeletal:        General: Normal range of motion.     Cervical back: Full passive range of motion without pain.  Skin:    General: Skin is warm and dry.     Capillary Refill: Capillary refill takes less than 2 seconds.  Neurological:     Mental Status: She is alert and oriented to person, place, and time.     Comments: Cranial nerves III-XII intact Follows commands, Moves all extremities  5/5 strength to BUE and BLE  Sensation intact throughout all major nerve distributions No gait ataxia.  Patient does report that she sometimes has her back hurt which sometimes gets worse when she walks.  She does endorse this currently.  She does not sway to one side and is able to walk in a straight line. No slurred speech. No facial droop.  Psychiatric:        Speech: Speech normal.    ED Results / Procedures / Treatments   Labs (all labs ordered are listed, but only abnormal results are displayed) Labs Reviewed  CBC - Abnormal; Notable for the following components:  Result Value   Hemoglobin 15.1 (*)    All other components within normal limits  COMPREHENSIVE METABOLIC PANEL - Abnormal; Notable for the following components:   Creatinine, Ser 1.37 (*)    GFR, Estimated 40 (*)    All other components within normal limits  PROTIME-INR  APTT  DIFFERENTIAL    EKG EKG Interpretation  Date/Time:  Sunday January 25 2021 13:41:40 EDT Ventricular Rate:  58 PR Interval:  168 QRS Duration: 80 QT Interval:  424 QTC Calculation: 416 R Axis:   -41 Text Interpretation: Sinus bradycardia Possible Left atrial enlargement Left axis deviation Abnormal ECG Confirmed by Rees, Elizabeth ([id number]) on 01/25/2021 4:04:06 PM  Radiology CT HEAD WO  CONTRAST  Result Date: 01/25/2021 CLINICAL DATA:  Dizziness, previous left-sided facial EXAM: CT HEAD WITHOUT CONTRAST TECHNIQUE: Contiguous axial images were obtained from the base of the skull through the vertex without intravenous contrast. COMPARISON:  None. FINDINGS: Brain: No evidence of acute infarction, hemorrhage, hydrocephalus, extra-axial collection or mass lesion/mass effect. Vascular: No hyperdense vessel or unexpected calcification. Skull: Hyperostosis frontalis. Negative for fracture or focal lesion. Sinuses/Orbits: No acute finding. Other: None. IMPRESSION: No acute intracranial pathology. Electronically Signed   By: Alex  Bibbey M.D.   On: 01/25/2021 14:32    Procedures Procedures   Medications Ordered in ED Medications  sodium chloride flush (NS) 0.9 % injection 3 mL (has no administration in time range)    ED Course  I have reviewed the triage vital signs and the nursing notes.  Pertinent labs & imaging results that were available during my care of the patient were reviewed by me and considered in my medical decision making (see chart for details).    MDM Rules/Calculators/A&P                          75  year old female who presents for evaluation of left-sided headache that began today.  She reports when she woke up this morning, she had a left-sided headache.  She felt like the left side of her face was puffy. Daughter felt like she appreciated left-sided facial droop, difficulty speaking.  Patient did not feel like she exhibited this.  They initially went to urgent care and was sent to the ED for further evaluation.  On initial arrival, patient is afebrile, nontoxic-appearing.  Blood pressure is improved.  Vitals are stable.  On ED arrival, she states that headache is gone.  She states she does not longer have any symptoms.  On my evaluation, I do not appreciate any facial droop.  I had an extensive discussion with patient and she exhibits no signs of slurred speech.  Neuro  exam is reassuring.  Labs, imaging ordered at triage.  Doubt ACS etiology as patient did not have any chest pain.  Low suspicion for stroke given that her symptoms have resolved here in the ED.  On my evaluation, I do not see any appreciable facial droop.  Is hard to say if she ever had 1 though daughter felt like her face was drooping.  Patient did not feel that way.  TIA is on differential.  Do not suspect meningitis.  Low suspicion for ICH given lack of trauma but is a consideration.  History/physical exam not concerning for hypertensive emergency.  Do not suspect dissection.  Labs, imaging ordered at triage.  CMP shows BUN of 19, creatinine 1.37.  CBC shows no leukocytosis.  Hemoglobin is stable at 15.1.  INR is 1.0.  CT  head shows no acute intracranial cranial pathology.  I discussed results with both patient and daughter.  I personally ambulated patient in the room.  She exhibited no signs of gait ataxia, did not sway and was able to walk straight.  She did report that sometimes when she walks, causes her back to hurt.  She does report feeling "dizzy" when she walks.  When I discussed this further with her patient states that this is not a new symptom and that his symptoms that she has had over the last month or so.  She has seen the ear nose and throat doctor and they told her it was related to her sinus issues.  She states that this dizziness is not new or different in any way and that what she has been experiencing the last few weeks.  I discussed with her extensively regarding further work-up here in the ED and discussed with her regarding obtaining an MRI to rule out any acute intracranial pathology such as CVA that would be concerning.  We also discussed TIA as a possible differential.  We discussed the risk versus benefits of obtaining an MRI versus declining an MRI, including but not limited to missed condition, worsening condition.  After extensive discussion with patient and daughter, patient  opted to decline MRI today.  She states she feels like this is her dizziness that she is always been having.  She states that she does not want a closed MRI as she is claustrophobic.  I did offer her sedation but patient declined.  She does follow with [facility] neurology.  I will put an ambulatory referral so that they can further evaluate her and arrange outpatient MRI if needed.   Upon discharge, I did again offer patient MRI.  Patient declined.  Patient reiterated back to me when I told her about the CT scan not being able to rule out CVA as the cause of her symptoms.  Patient and daughter were in agreement and declined MRI. Fayrene Fearing RN witnessed this convo.  Ambulatory referral for neurology sent.  Patient instructed that she can return the emergency department for any time for any symptoms. Updated Dr. Ralene Bathe. Patient had ample opportunity for questions and discussion. All patient's questions were answered with full understanding. Strict return precautions discussed. Patient expresses understanding and agreement to plan.    Portions of this note were generated with [employer]. Dictation errors may occur despite best attempts at proofreading.   Final Clinical Impression(s) / ED Diagnoses Final diagnoses:  Acute nonintractable headache, unspecified headache type  Dizziness    Rx / DC Orders ED Discharge Orders          Ordered    Ambulatory referral to Neurology       Comments: An appointment is requested in approximately: 1 week   01/25/21 1632             Desma Mcgregor 01/25/21 2239    Quintella Reichert, MD 01/25/21 2302

## 2021-01-25 NOTE — ED Triage Notes (Signed)
Pt is here for left sided HA which began when she woke up this am.  Pt reports that yesterday she had pain in left ear. Pt state states that she had a little puffyness around her left eye.  Pt went to bed last pm around midnight last night and woke up this am with the pain around 8am.

## 2021-01-25 NOTE — Discharge Instructions (Addendum)
As we discussed today, your work-up was reassuring.  We did offer you MRI but you declined.  It is important you follow-up with your neurology doctor.  I put in referrals that they know you are here.  Please call their office and arrange an appointment.  Return the emergency department at any time if you feel like you are having trouble walking, difficulty speaking, numbness or weakness of your arms or legs or any other worsening concerning symptoms.

## 2021-01-25 NOTE — ED Notes (Signed)
Pt was seen at [facility] and sent here for further evaluation

## 2021-01-25 NOTE — ED Triage Notes (Signed)
Pt present headache and elevated blood pressure, symptoms started this am. Pt states headache is mainly on left side of her head.

## 2021-01-25 NOTE — ED Notes (Signed)
Attempted to call report to ED charge x3 with no answer.

## 2021-01-25 NOTE — ED Notes (Signed)
Read [facility] notes and see that pt was reportedly having facial numbness which pt denies when asked.  Facial droop not noted at this time.  Pt does not have any focal weakness or arm drift in triage.  Pt reports that she has had dizziness in the past when she had sinus problems and she states that she has not had dizziness but that her vision is "a little bit off focus".  Pt reports tightness in face and has been using nasal spray which seems to help.

## 2021-01-25 NOTE — ED Notes (Signed)
Also reports dizziness.

## 2021-01-26 ENCOUNTER — Telehealth: Payer: Self-pay | Admitting: Family Medicine

## 2021-01-26 NOTE — Telephone Encounter (Signed)
Pt call and stated she want a referral to [facility] for her headache she stated she went to the hospital and they couldn't find anything .Pt stated she see Ward Givens at [facility] and want a call back.

## 2021-01-26 NOTE — Telephone Encounter (Signed)
Okay for referral?

## 2021-01-27 NOTE — Telephone Encounter (Signed)
I left patient a voicemail letting her know that since she is already a patient of [facility], she should be able to call and make an appointment. Advised her to let me know if she needs a new referral.

## 2021-01-29 ENCOUNTER — Other Ambulatory Visit: Payer: Self-pay | Admitting: Family Medicine

## 2021-01-29 DIAGNOSIS — M461 Sacroiliitis, not elsewhere classified: Secondary | ICD-10-CM | POA: Diagnosis not present

## 2021-02-03 ENCOUNTER — Telehealth: Payer: Self-pay | Admitting: Internal Medicine

## 2021-02-03 NOTE — Telephone Encounter (Signed)
STAT if patient feels like he/she is going to faint   Are you dizzy now? Yes, pt has been dizzy since April. Pt has been seen by ED physician, ENT and PCP and was diagnosed with sinus infection. Pt has had EKG and CT Scan. Pt has an appt with Neurology on 02/16/21  Do you feel faint or have you passed out? No  Do you have any other symptoms? No  Have you checked your HR and BP (record if available)? 136/68 HR 77

## 2021-02-03 NOTE — Telephone Encounter (Signed)
Pt reports that she is dizzy and has a strange feeling in her eyes and nose.  She has been seen by ENT who thought sinusitis/infection is the cause.  She does not have follow up scheduled w them.  She is going to the neurologist as well.  She was at the hospital last week and they did not see any signs of vertigo.    She was given prednisone two different times and the first time it cleared everything up.  The second time she got from ENT and it made her so sick so she stopped after 2 days.  I adv her to keep appt w neuro and call ENT to let them know the dizziness has returned. Also adv I would let Dr. Harrington Challenger know of this and if she has any further recommendations we will call her back.  Pt in agreement with this plan.

## 2021-02-03 NOTE — Telephone Encounter (Signed)
No new recomm.    Agree with neuro appt on 7/18 Stay hydrated

## 2021-02-06 ENCOUNTER — Ambulatory Visit: Payer: Medicare HMO

## 2021-02-06 ENCOUNTER — Other Ambulatory Visit: Payer: Self-pay

## 2021-02-06 DIAGNOSIS — G4733 Obstructive sleep apnea (adult) (pediatric): Secondary | ICD-10-CM

## 2021-02-09 DIAGNOSIS — R519 Headache, unspecified: Secondary | ICD-10-CM | POA: Diagnosis not present

## 2021-02-10 ENCOUNTER — Other Ambulatory Visit: Payer: Self-pay | Admitting: Family Medicine

## 2021-02-12 DIAGNOSIS — G4733 Obstructive sleep apnea (adult) (pediatric): Secondary | ICD-10-CM | POA: Diagnosis not present

## 2021-02-16 ENCOUNTER — Encounter: Payer: Self-pay | Admitting: Diagnostic Neuroimaging

## 2021-02-16 ENCOUNTER — Ambulatory Visit: Payer: Medicare HMO | Admitting: Diagnostic Neuroimaging

## 2021-02-16 VITALS — BP 110/64 | HR 74 | Ht 66.0 in | Wt 226.1 lb

## 2021-02-16 DIAGNOSIS — Z20828 Contact with and (suspected) exposure to other viral communicable diseases: Secondary | ICD-10-CM | POA: Diagnosis not present

## 2021-02-16 DIAGNOSIS — G6289 Other specified polyneuropathies: Secondary | ICD-10-CM | POA: Diagnosis not present

## 2021-02-16 DIAGNOSIS — G44229 Chronic tension-type headache, not intractable: Secondary | ICD-10-CM | POA: Diagnosis not present

## 2021-02-16 DIAGNOSIS — R42 Dizziness and giddiness: Secondary | ICD-10-CM | POA: Diagnosis not present

## 2021-02-16 DIAGNOSIS — Z20822 Contact with and (suspected) exposure to covid-19: Secondary | ICD-10-CM | POA: Diagnosis not present

## 2021-02-16 MED ORDER — GABAPENTIN 300 MG PO CAPS: 600.0000 mg | ORAL_CAPSULE | Freq: Two times a day (BID) | ORAL | 4 refills | Status: DC

## 2021-02-16 NOTE — Progress Notes (Signed)
[facility]  PATIENT: [name] DOB: Mar 13, 1946  REFERRING CLINICIAN: [name], [name], MD  HISTORY FROM: patient  REASON FOR VISIT: follow up   HISTORICAL  CHIEF COMPLAINT:  Chief Complaint  Patient presents with   Follow-up    Rm 7 Last visit was in 2020- Here to discuss a new issues, H/A question of TIA? Pt reports she is here to discuss worsening H/a. Pt thought the increased h/a problem was due to sinus infection. After antibiotic/ prednisone treatment- sx did not resolve completley. ENT evaluation completed- no abnormalities found. Pt repots associated dizziness with her h/a. Denies any falls. Reports sx are intermittent.      HISTORY OF PRESENT ILLNESS:   UPDATE (02/16/21, VRP): Since last visit, patient reports onset of headache, dizziness, photophobia and pressure since spring 2021.  She was treated for sinus issues around that time and symptoms improved.  Then symptoms returned a few months later.  Now having almost daily dizzy lightheaded sensations, mild headaches, gait and balance difficulties.  She is under significant stress, tends to worry and feel anxious, sleeping only 4 to 5 hours of sleep at night.  Also recently had home sleep study which shows severe sleep apnea, pending follow-up and treatment.  Patient went to ER for evaluation and testing was unremarkable.  Also went to ENT for evaluation and testing was unremarkable.  UPDATE (11/14/17, VRP): Since last visit, doing worse with pain in left arm and left leg. Tolerating gabapentin 400mg  . No alleviating or aggravating factors.   UPDATE (09/19/17, MM): She states overall she is doing well.  She does have burning and tingling in the left arm that is worse at night.  She also reports she occasionally has burning in the feet.  She states the compounded cream and gabapentin has been beneficial.  She did see hand surgery for carpal tunnel but he did not feel that it was severe enough to complete surgery  per the patient.  The patient also reports that she is been having neck pain.  She did see her primary care who felt that it was muscle spasms and placed on tizanidine.  She returns today for evaluation.  UPDATE 03/16/17: Since last visit, doing a little better with neuropathy cream and gabapentin. Sxs stable. Planning to get second opinion [facility] rheumatology clinic.    PRIOR HPI (12/01/16): 75 year old female with history of rheumatoid arthritis, here for evaluation of left hand numbness and pain. Patient reports numbness in bilateral hands and feet, especially left hand for past 6 months. She describes pain and tenderness in her left fifth digit radiating up to her left elbow. Patient denies any recent accidents or traumas. Patient has history of diabetes, hypertension, renal cell carcinoma, rheumatoid arthritis, currently on leflunomide. Patient presented for EMG nerve conduction study on 10/14/16 which showed diffuse widespread axonal polyneuropathy as well as superimposed left carpal tunnel syndrome. Patient has history of right carpal tunnel syndrome status post surgery with good results.    REVIEW OF SYSTEMS: Full 14 system review of systems performed and negative with exception of: as per HPI.   ALLERGIES: Allergies  Allergen Reactions   Oxycodone-Acetaminophen Hives and Itching    hallucinations   Percocet [Oxycodone-Acetaminophen] Hives, Itching and Other (See Comments)    hallucinations   Pravastatin Sodium Other (See Comments)    Muscle aches Muscle aches   Hydrocodone Other (See Comments)    "crazy dreams"   Aspirin Other (See Comments)    GI upset Other  reaction(s): Other (See Comments) REACTION: GI upset    HOME MEDICATIONS: Outpatient Medications Prior to Visit  Medication Sig Dispense Refill   acetaminophen (TYLENOL) 500 MG tablet Take 1,000 mg by mouth as needed for moderate pain or headache.      albuterol (VENTOLIN HFA) 108 (90 Base) MCG/ACT inhaler INHALE ONE OR TWO  PUFFS BY MOUTH INTO THE LUNGS EVERY SIX HOURS AS NEEDED FOR WHEEZING OR SHORTNESS OF BREATH 8.5 g 0   Alirocumab (PRALUENT) 75 MG/ML SOAJ Inject 75 mg into the skin every 14 (fourteen) days. 2 mL 11   aspirin 81 MG chewable tablet Chew 81 mg by mouth daily.     clopidogrel (PLAVIX) 75 MG tablet Take 75 mg by mouth daily.     colchicine 0.6 MG tablet Take 0.5 tablets (0.3 mg total) by mouth daily. 45 tablet 0   DULoxetine (CYMBALTA) 30 MG capsule Take 1 capsule (30 mg total) by mouth daily. 90 capsule 1   ezetimibe (ZETIA) 10 MG tablet Take 1 tablet (10 mg total) by mouth daily. 90 tablet 3   fluticasone (FLONASE) 50 MCG/ACT nasal spray PLACE 2 SPRAYS INTO BOTH NOSTRILS AT BEDTIME AS NEEDED FOR ALLERGIES. 48 mL 1   Fluticasone-Umeclidin-Vilant (TRELEGY ELLIPTA) 100-62.5-25 MCG/INH AEPB Inhale 1 puff into the lungs daily. 14 each 0   gabapentin (NEURONTIN) 100 MG capsule Take 1 capsule (100 mg total) by mouth 3 (three) times daily. 90 capsule 11   gabapentin (NEURONTIN) 400 MG capsule Take 1 capsule (400 mg total) by mouth 2 (two) times daily. 180 capsule 4   losartan (COZAAR) 100 MG tablet TAKE ONE TABLET BY MOUTH ONE TIME DAILY 90 tablet 2   metoprolol succinate (TOPROL-XL) 100 MG 24 hr tablet Take 1 tablet (100 mg total) by mouth daily. Take with or immediately following a meal. 90 tablet 1   montelukast (SINGULAIR) 10 MG tablet Take 1 tablet (10 mg total) by mouth at bedtime. 30 tablet 3   MYRBETRIQ 25 MG TB24 tablet Take 25 mg by mouth daily.     nitroGLYCERIN (NITROSTAT) 0.4 MG SL tablet Place 1 tablet (0.4 mg total) under the tongue every 5 (five) minutes as needed for chest pain. 30 tablet 6   pantoprazole (PROTONIX) 40 MG tablet Take 1 tablet (40 mg total) by mouth daily. 30 tablet 1   polyethylene glycol powder (GLYCOLAX/MIRALAX) powder Take 17 g by mouth daily. 500 g 3   rosuvastatin (CRESTOR) 20 MG tablet Take 1 tablet (20 mg total) by mouth daily. 30 tablet 11    triamterene-hydrochlorothiazide (MAXZIDE-25) 37.5-25 MG tablet TAKE HALF TABLET BY MOUTH DAILY 45 tablet 2   No facility-administered medications prior to visit.    PAST MEDICAL HISTORY: Past Medical History:  Diagnosis Date   Allergy    Anxiety    C. difficile diarrhea    history of   CAD (coronary artery disease), native coronary artery    a. 05/2017 showed 100% mLCx tx with DES, otherwise 20% mRCA, 50% ramus, 30% dLAD, 20% prox-mid LAD, 20% mLAD treated medically.    Cancer [facility])    Right kidney CA removed.    CKD (chronic kidney disease) stage 3, GFR 30-59 ml/min ([place]) 10/11/2016   s/p R nephrectomy   Colon polyps 2008   HYPERPLASTIC   Diabetes mellitus without complication ([place])    type 2  no meds   Gastritis    GERD (gastroesophageal reflux disease)    History of echocardiogram    Echo 3/18:  Moderate LVH, EF 65-46, grade 1 diastolic dysfunction, calcified aortic valve, mild MR, moderate LAE   History of nuclear stress test    Myoview 3/18: Mod size and intensity fixed septal defect, may be artifact.Opposite mod size and intensity lat defect, which is reversible and could represent ischemia or possibly artifact (SDS 4). LVEF 71% with normal wall motion. Intermediate risk study. >> images reviewed with Dr. Dorris Carnes - no sig ischemia; med rx    Hx of cardiovascular stress test    Lexiscan Myoview 6/16:  EF 70%, no scar or ischemia; Low Risk   Hyperlipidemia    Hypertension    Neuropathy    Orthostatic hypotension 05/28/2017   OSA (obstructive sleep apnea) 01/16/2021   Osteoarthritis    Rheumatoid arthritis ([place])    RA (Dr. Ouida Sills) Bilateral hands   S/P angioplasty with stent 05/27/17 to LCX with DES  05/28/2017   Thyroid disease     PAST SURGICAL HISTORY: Past Surgical History:  Procedure Laterality Date   ABDOMINAL HYSTERECTOMY  1978   ANTERIOR CERVICAL DECOMP/DISCECTOMY FUSION N/A 05/29/2018   Procedure: ANTERIOR CERVICAL DECOMPRESSION FUSION - CERVICAL  FIVE-CERVICAL SIX - CERVICAL SIX-CERVICAL SEVEN;  Surgeon: Earnie Larsson, MD;  Location: [place];  Service: Neurosurgery;  Laterality: N/A;   BACK SURGERY     x 2   CARPAL TUNNEL RELEASE Left    COLONOSCOPY W/ BIOPSIES AND POLYPECTOMY     Hx: of   CORONARY STENT INTERVENTION N/A 05/27/2017   Procedure: CORONARY STENT INTERVENTION;  Surgeon: Burnell Blanks, MD;  Location: [place] [facility];  Service: Cardiovascular;  Laterality: N/A;   ESOPHAGOGASTRODUODENOSCOPY     HNP     LEFT HEART CATH AND CORONARY ANGIOGRAPHY N/A 05/27/2017   Procedure: LEFT HEART CATH AND CORONARY ANGIOGRAPHY;  Surgeon: Burnell Blanks, MD;  Location: [place] [facility];  Service: Cardiovascular;  Laterality: N/A;   LUMBAR LAMINECTOMY/DECOMPRESSION MICRODISCECTOMY Left 02/12/2013   Procedure: LUMBAR TWO THREE, LUMBAR THREE FOUR, LUMBAR FOUR FIVE  LAMINECTOMY/DECOMPRESSION MICRODISCECTOMY 3 LEVELS;  Surgeon: Charlie Pitter, MD;  Location: [place] [facility];  Service: Neurosurgery;  Laterality: Left;   NEPHRECTOMY Right 2010   10.rcc cancer   TOTAL KNEE ARTHROPLASTY Right    Redo    FAMILY HISTORY: Family History  Problem Relation Age of Onset   Rheum arthritis Mother    Stroke Mother    Prostate cancer Father    Heart disease Father    Diabetes Brother    Dementia Brother    Parkinsonism Brother    Kidney disease Son    Diabetes Brother    Cancer Brother    Dementia Brother    Colon cancer Neg Hx    Esophageal cancer Neg Hx    Pancreatic cancer Neg Hx    Liver disease Neg Hx    Rectal cancer Neg Hx    Stomach cancer Neg Hx     SOCIAL HISTORY:  Social History   Socioeconomic History   Marital status: Divorced    Spouse name: Not on file   Number of children: 3   Years of education: 12   Highest education level: Not on file  Occupational History   Occupation: Retired    Fish farm manager: RETIRED  Tobacco Use   Smoking status: Every Day    Packs/day: 0.20    Years: 15.00    Pack years: 3.00     Types: Cigarettes    Start date: 1970   Smokeless tobacco: Never  Tobacco comments:    pt has been an on and off smoker since age 71  Vaping Use   Vaping Use: Never used  Substance and Sexual Activity   Alcohol use: No    Alcohol/week: 0.0 standard drinks   Drug use: No   Sexual activity: Not on file  Other Topics Concern   Not on file  Social History Narrative   Single, dgtr lives with her   Current on/off smoker   Alcohol use- no   Drug use-no   Regular Exercise-yes   Social Determinants of Health   Financial Resource Strain: Low Risk    Difficulty of Paying Living Expenses: Not hard at all  Food Insecurity: No Food Insecurity   Worried About Charity fundraiser in the Last Year: Never true   Ran Out of Food in the Last Year: Never true  Transportation Needs: No Transportation Needs   Lack of Transportation (Medical): No   Lack of Transportation (Non-Medical): No  Physical Activity: Inactive   Days of Exercise per Week: 0 days   Minutes of Exercise per Session: 0 min  Stress: No Stress Concern Present   Feeling of Stress : Not at all  Social Connections: Moderately Isolated   Frequency of Communication with Friends and Family: More than three times a week   Frequency of Social Gatherings with Friends and Family: More than three times a week   Attends Religious Services: More than 4 times per year   Active Member of [employer] or Organizations: No   Attends Archivist Meetings: Never   Marital Status: Divorced  Human resources officer Violence: Not At Risk   Fear of Current or Ex-Partner: No   Emotionally Abused: No   Physically Abused: No   Sexually Abused: No     PHYSICAL EXAM  GENERAL EXAM/CONSTITUTIONAL: Vitals:  Vitals:   02/16/21 1545  BP: 110/64  Pulse: 74  SpO2: 95%  Weight: 226 lb 2 oz (102.6 kg)  Height: 5\' 6"  (1.676 m)   Body mass index is 36.5 kg/m. No results found. Patient is in no distress; well developed, nourished and groomed;  neck is supple  CARDIOVASCULAR: Examination of carotid arteries is normal; no carotid bruits Regular rate and rhythm, no murmurs Examination of peripheral vascular system by observation and palpation is normal  EYES: Ophthalmoscopic exam of optic discs and posterior segments is normal; no papilledema or hemorrhages  MUSCULOSKELETAL: Gait, strength, tone, movements noted in Neurologic exam below  NEUROLOGIC: MENTAL STATUS:  No flowsheet data found. awake, alert, oriented to person, place and time recent and remote memory intact normal attention and concentration language fluent, comprehension intact, naming intact,  fund of knowledge appropriate  CRANIAL NERVE:  2nd - no papilledema on fundoscopic exam 2nd, 3rd, 4th, 6th - pupils equal and reactive to light, visual fields full to confrontation, extraocular muscles intact, no nystagmus 5th - facial sensation symmetric 7th - facial strength symmetric 8th - hearing intact 9th - palate elevates symmetrically, uvula midline 11th - shoulder shrug symmetric 12th - tongue protrusion midline  MOTOR:  normal bulk and tone, full strength in the BUE, BLE  SENSORY:  normal and symmetric to light touch, temperature, vibration DECR PP IN BILATERAL HANDS  COORDINATION:  finger-nose-finger, fine finger movements normal  REFLEXES:  deep tendon reflexes TRACE and symmetric  GAIT/STATION:  narrow based gait   DIAGNOSTIC DATA (LABS, IMAGING, TESTING) - I reviewed patient records, labs, notes, testing and imaging myself where available.  Lab Results  Component Value Date   WBC 7.6 01/25/2021   HGB 15.1 (H) 01/25/2021   HCT 45.4 01/25/2021   MCV 92.3 01/25/2021   PLT 260 01/25/2021      Component Value Date/Time   NA 137 01/25/2021 1335   NA 139 07/31/2019 1418   K 4.0 01/25/2021 1335   CL 106 01/25/2021 1335   CO2 24 01/25/2021 1335   GLUCOSE 92 01/25/2021 1335   BUN 19 01/25/2021 1335   BUN 20 07/31/2019 1418    CREATININE 1.37 (H) 01/25/2021 1335   CREATININE 1.47 (H) 07/07/2020 1104   CALCIUM 9.3 01/25/2021 1335   PROT 7.7 01/25/2021 1335   PROT 6.9 11/10/2020 0926   ALBUMIN 3.5 01/25/2021 1335   ALBUMIN 4.3 09/11/2020 1227   AST 21 01/25/2021 1335   ALT 14 01/25/2021 1335   ALKPHOS 59 01/25/2021 1335   BILITOT 0.6 01/25/2021 1335   BILITOT 0.4 09/11/2020 1227   GFRNONAA 40 (L) 01/25/2021 1335   GFRNONAA 34 (L) 07/07/2020 1104   GFRAA 40 (L) 07/07/2020 1104   Lab Results  Component Value Date   CHOL 143 09/11/2020   HDL 56 09/11/2020   LDLCALC 69 09/11/2020   LDLDIRECT 171.5 12/06/2011   TRIG 98 09/11/2020   CHOLHDL 2.6 09/11/2020   Lab Results  Component Value Date   HGBA1C 6.6 (H) 11/10/2020   Lab Results  Component Value Date   VITAMINB12 232 11/10/2020   Lab Results  Component Value Date   TSH 2.91 11/10/2020    10/14/16 EMG/NCS 1. 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 2. Superimposed left median neuropathy at the wrist consistent with left carpal tunnel syndrome.   01/25/21 CT head - No acute intracranial pathology.    ASSESSMENT AND PLAN  75 y.o. year old female here with rheumatoid arthritis, numbness and tingling in the hands and feet, especially with tenderness and pain in the left hand and left forearm. Left hand and forearm symptoms may be related to combination of arthritis, peripheral neuropathy and left carpal tunnel syndrome.    Dx left hand pain: rheumatoid arthritis + axonal peripheral neuropathy (due to rheumatoid arthritis or DMT or other causes) + left carpal tunnel syndrome  1. Axonal neuropathy   2. Dizziness   3. Chronic tension-type headache, not intractable      PLAN:  LIGHTHEADEDNESS, DIZZINESS, FATIGUE, ANXIETY, OSA, INSOMNIA - recommend to follow up with sleep clinic for OSA treatment - consider psychology for anxiety / insomnia - if not improving, then consider MRI brain  NEUROPATHY  (due to rheumatoid arthritis or DMT or other cause) - increase gabapentin to 600mg  twice a day  ARTHRITIS PAIN (? seroneg RA vs crystalline arthropathy or other problem) - follow up with rheumatology clinic at [place] ordered this encounter  Medications   gabapentin (NEURONTIN) 300 MG capsule    Sig: Take 2 capsules (600 mg total) by mouth 2 (two) times daily.    Dispense:  360 capsule    Refill:  4   Return in about 1 year (around 02/16/2022) for with NP Ward Givens).    Penni Bombard, MD 8/88/2800, 3:49 PM Certified in Neurology, Neurophysiology and Neuroimaging  [facility] [address] [place], [place] [address] [phone]

## 2021-02-16 NOTE — Patient Instructions (Signed)
  LIGHTHEADEDNESS, DIZZINESS, FATIGUE, ANXIETY, OSA, INSOMNIA - recommend to follow up with sleep clinic for OSA treatment - consider psychology for anxiety / insomnia - if not improving, then consider MRI brain  NEUROPATHY (due to rheumatoid arthritis or DMT or other cause) - increase gabapentin to 600mg  twice a day

## 2021-02-19 ENCOUNTER — Other Ambulatory Visit: Payer: Self-pay | Admitting: Physician Assistant

## 2021-03-02 ENCOUNTER — Other Ambulatory Visit: Payer: Self-pay | Admitting: Family Medicine

## 2021-03-02 ENCOUNTER — Other Ambulatory Visit: Payer: Self-pay | Admitting: Internal Medicine

## 2021-03-02 DIAGNOSIS — I119 Hypertensive heart disease without heart failure: Secondary | ICD-10-CM

## 2021-03-04 ENCOUNTER — Ambulatory Visit: Payer: Medicare HMO | Admitting: Adult Health

## 2021-03-14 ENCOUNTER — Other Ambulatory Visit: Payer: Self-pay | Admitting: Family Medicine

## 2021-03-20 ENCOUNTER — Telehealth: Payer: Self-pay | Admitting: Family Medicine

## 2021-03-20 NOTE — Telephone Encounter (Signed)
Pt call and stated she need a refill on famotidine (PEPCID) 20 MG tablet [Pharmacy Med Name: FAMOTIDINE '20MG'$  TABLETS sent to  [facility], [place] Phone:  [phone]  Fax:  [phone]

## 2021-03-20 NOTE — Telephone Encounter (Signed)
Patient returning a call to the pharmacist if she does not answer may leave a message

## 2021-03-20 NOTE — Telephone Encounter (Signed)
Patient was confused about the switch from metoprolol tartrate to metoprolol succinate and wanted to know why this was changed and how this would help with how she is feeling. Per record review, her PCP changed it from metoprolol tartrate to succinate to lessen side effects. Reassured the patient that they would work the same and that she should be fine to take this one that took the place of the metoprolol tartrate.

## 2021-03-20 NOTE — Chronic Care Management (AMB) (Signed)
  Chronic Care Management   Note  03/20/2021 Name: ANALEE GOSLIN MRN: [id number] DOB: Jun 16, 1946  [name] is a 75 y.o. year old female who is a primary care patient of [name], Malka So, MD. I reached out to [name] by phone today in response to a referral sent by Ms. Trinidad Curet Mcwhirt's PCP, [name], [name], MD.   Ms. Haslett was given information about Chronic Care Management services today including:  CCM service includes personalized support from designated clinical staff supervised by her physician, including individualized plan of care and coordination with other care providers 24/7 contact phone numbers for assistance for urgent and routine care needs. Service will only be billed when office clinical staff spend 20 minutes or more in a month to coordinate care. Only one practitioner may furnish and bill the service in a calendar month. The patient may stop CCM services at any time (effective at the end of the month) by phone call to the office staff.   Patient agreed to services and verbal consent obtained.   Follow up plan:   Tatjana Secretary/administrator

## 2021-03-23 MED ORDER — FAMOTIDINE 20 MG PO TABS: 20.0000 mg | ORAL_TABLET | Freq: Two times a day (BID) | ORAL | 1 refills | Status: DC

## 2021-03-23 NOTE — Telephone Encounter (Signed)
Rx sent in

## 2021-03-28 ENCOUNTER — Other Ambulatory Visit: Payer: Self-pay | Admitting: Internal Medicine

## 2021-04-17 ENCOUNTER — Ambulatory Visit (INDEPENDENT_AMBULATORY_CARE_PROVIDER_SITE_OTHER): Payer: Medicare HMO | Admitting: Family Medicine

## 2021-04-17 ENCOUNTER — Other Ambulatory Visit: Payer: Self-pay

## 2021-04-17 ENCOUNTER — Encounter: Payer: Self-pay | Admitting: Family Medicine

## 2021-04-17 VITALS — BP 128/80 | HR 86 | Resp 16 | Ht 66.0 in | Wt 228.0 lb

## 2021-04-17 DIAGNOSIS — N183 Chronic kidney disease, stage 3 unspecified: Secondary | ICD-10-CM

## 2021-04-17 DIAGNOSIS — G629 Polyneuropathy, unspecified: Secondary | ICD-10-CM

## 2021-04-17 DIAGNOSIS — E1122 Type 2 diabetes mellitus with diabetic chronic kidney disease: Secondary | ICD-10-CM | POA: Diagnosis not present

## 2021-04-17 DIAGNOSIS — G4730 Sleep apnea, unspecified: Secondary | ICD-10-CM | POA: Diagnosis not present

## 2021-04-17 DIAGNOSIS — I119 Hypertensive heart disease without heart failure: Secondary | ICD-10-CM | POA: Diagnosis not present

## 2021-04-17 DIAGNOSIS — Z23 Encounter for immunization: Secondary | ICD-10-CM | POA: Diagnosis not present

## 2021-04-17 DIAGNOSIS — E538 Deficiency of other specified B group vitamins: Secondary | ICD-10-CM | POA: Diagnosis not present

## 2021-04-17 DIAGNOSIS — J301 Allergic rhinitis due to pollen: Secondary | ICD-10-CM | POA: Diagnosis not present

## 2021-04-17 DIAGNOSIS — R5383 Other fatigue: Secondary | ICD-10-CM

## 2021-04-17 LAB — POCT GLYCOSYLATED HEMOGLOBIN (HGB A1C): HbA1c, POC (prediabetic range): 5.9 % (ref 5.7–6.4)

## 2021-04-17 MED ORDER — CYANOCOBALAMIN 1000 MCG/ML IJ SOLN
1000.0000 ug | Freq: Once | INTRAMUSCULAR | Status: AC
  Administered 2021-04-17: 1000 ug via INTRAMUSCULAR

## 2021-04-17 NOTE — Progress Notes (Signed)
ACUTE VISIT Chief Complaint  Patient presents with   Fatigue   sinus congestion   HPI: Ms.Nashira L Lykken is a 75 y.o. female with hx of CAD,chronic pain,CKD III,RA,tobacco use disorder,COPD,anxiety,DM II,and HTN  here today with above complaint.  She was seen on 11/26/20. Fatigue: Problem has been going on for a while, intermittently. She has reported fatigue since 2019. Blood work has been otherwise negative. 11/10/20 she had serum/urine electrophoresis,cortisol,CRP,TSH,Bq12. Headaches and she does not feel rested when she gets up.  Negative for fever,chills,changes in appetite,night sweat, or abnormal wt loss. She had her sleep study done and CPAP was recommended.  Allergic rhinitis: Resumed Flonase nasal spray and Claritin 10 mg daily. In 09/2020 Singulair was recommended. No sick contact or recent ravel. Usually symptoms are worse with seasonal changes. Nasal congestion worse when lying down. No ageusia or anosmia.  Last follow up in 10/2020.  Hypertension:  Medications:[name] 37.5-25 mg daily,Metoprolol succinate 100 mg daily,and Losartan 100 mg daily. BP readings at home:Not checking regularly. Side effects:none. CKD III: Negative for gross hematuria or foam in urine.  Negative for visual changes, exertional chest pain, dyspnea, palpitations,focal weakness, or worsening edema.  Lab Results  Component Value Date   CREATININE 1.37 (H) 01/25/2021   BUN 19 01/25/2021   NA 137 01/25/2021   K 4.0 01/25/2021   CL 106 01/25/2021   CO2 24 01/25/2021   Lab Results  Component Value Date   WBC 7.6 01/25/2021   HGB 15.1 (H) 01/25/2021   HCT 45.4 01/25/2021   MCV 92.3 01/25/2021   PLT 260 01/25/2021   Lab Results  Component Value Date   ALT 14 01/25/2021   AST 21 01/25/2021   ALKPHOS 59 01/25/2021   BILITOT 0.6 01/25/2021   Lab Results  Component Value Date   TSH 2.91 11/10/2020   B12 deficiency: She is not on B12 supplementation.  Lab Results   Component Value Date   [id number] 11/10/2020   Since her last visit she has seen neurosurgeon, epidural injection, 3-4 months ago and it did help but pain is coming back.  DM II: She is on non pharmacologic treatment. Not checking BS. Negative for polydipsia,polyuria, or polyphagia.  Lab Results  Component Value Date   HGBA1C 6.6 (H) 11/10/2020   Peripheral neuropathy: Occasionally  feet needle sensation.  According to pt, this problem is causing her unbalance sensation. Cold feet, she sleep with soaks. She has not noted ulcers. She is not longer on Duloxetine. She is on Gabapentin 600 mg bid. She has decreased portion sizes. Baking most of the time.  Review of Systems  Constitutional:  Positive for appetite change. Negative for activity change and diaphoresis.  HENT:  Negative for mouth sores, sinus pain and sore throat.   Respiratory:  Negative for cough and wheezing.   Gastrointestinal:  Negative for abdominal pain, nausea and vomiting.       Negative for changes in bowel habits.  Endocrine: Negative for cold intolerance and heat intolerance.  Genitourinary:  Negative for decreased urine volume and dysuria.  Musculoskeletal:  Positive for arthralgias and back pain.  Allergic/Immunologic: Positive for environmental allergies.  Neurological:  Negative for syncope, facial asymmetry and weakness.  Psychiatric/Behavioral:  Negative for confusion. The patient is nervous/anxious.   Rest see pertinent positives and negatives per HPI.  Current Outpatient Medications on File Prior to Visit  Medication Sig Dispense Refill   acetaminophen (TYLENOL) 500 MG tablet Take 1,000 mg by mouth as needed for moderate  pain or headache.      albuterol (VENTOLIN HFA) 108 (90 Base) MCG/ACT inhaler INHALE ONE OR TWO PUFFS BY MOUTH INTO THE LUNGS EVERY SIX HOURS AS NEEDED FOR WHEEZING OR SHORTNESS OF BREATH 8.5 g 0   Alirocumab (PRALUENT) 75 MG/ML SOAJ Inject 75 mg into the skin every 14  (fourteen) days. 2 mL 11   aspirin 81 MG chewable tablet Chew 81 mg by mouth daily.     clopidogrel (PLAVIX) 75 MG tablet Take 75 mg by mouth daily.     colchicine 0.6 MG tablet Take 0.5 tablets (0.3 mg total) by mouth daily. 45 tablet 0   ezetimibe (ZETIA) 10 MG tablet TAKE ONE TABLET BY MOUTH ONE TIME DAILY 90 tablet 1   famotidine (PEPCID) 20 MG tablet Take 1 tablet (20 mg total) by mouth 2 (two) times daily. 180 tablet 1   fluticasone (FLONASE) 50 MCG/ACT nasal spray PLACE 2 SPRAYS INTO BOTH NOSTRILS AT BEDTIME AS NEEDED FOR ALLERGIES. 48 mL 1   Fluticasone-Umeclidin-Vilant (TRELEGY ELLIPTA) 100-62.5-25 MCG/INH AEPB Inhale 1 puff into the lungs daily. 14 each 0   gabapentin (NEURONTIN) 300 MG capsule Take 2 capsules (600 mg total) by mouth 2 (two) times daily. 360 capsule 4   losartan (COZAAR) 100 MG tablet TAKE ONE TABLET BY MOUTH ONE TIME DAILY 90 tablet 2   metoprolol succinate (TOPROL-XL) 100 MG 24 hr tablet Take 1 tablet (100 mg total) by mouth daily. Take with or immediately following a meal. 90 tablet 1   montelukast (SINGULAIR) 10 MG tablet Take 1 tablet (10 mg total) by mouth at bedtime. 30 tablet 3   MYRBETRIQ 25 MG TB24 tablet Take 25 mg by mouth daily.     nitroGLYCERIN (NITROSTAT) 0.4 MG SL tablet Place 1 tablet (0.4 mg total) under the tongue every 5 (five) minutes as needed for chest pain. 30 tablet 6   pantoprazole (PROTONIX) 40 MG tablet Take 1 tablet (40 mg total) by mouth daily. 30 tablet 1   polyethylene glycol powder (GLYCOLAX/MIRALAX) powder Take 17 g by mouth daily. 500 g 3   rosuvastatin (CRESTOR) 20 MG tablet Take 1 tablet (20 mg total) by mouth daily. 30 tablet 11   triamterene-hydrochlorothiazide (MAXZIDE-25) 37.5-25 MG tablet TAKE HALF TABLET BY MOUTH DAILY 45 tablet 0   No current facility-administered medications on file prior to visit.   Past Medical History:  Diagnosis Date   Allergy    Anxiety    C. difficile diarrhea    history of   CAD (coronary  artery disease), native coronary artery    a. 05/2017 showed 100% mLCx tx with DES, otherwise 20% mRCA, 50% ramus, 30% dLAD, 20% prox-mid LAD, 20% mLAD treated medically.    Cancer [facility])    Right kidney CA removed.    CKD (chronic kidney disease) stage 3, GFR 30-59 ml/min ([place]) 10/11/2016   s/p R nephrectomy   Colon polyps 2008   HYPERPLASTIC   Diabetes mellitus without complication ([place])    type 2  no meds   Gastritis    GERD (gastroesophageal reflux disease)    History of echocardiogram    Echo 3/18:  Moderate LVH, EF [id number], grade 1 diastolic dysfunction, calcified aortic valve, mild MR, moderate LAE   History of nuclear stress test    Myoview 3/18: Mod size and intensity fixed septal defect, may be artifact.Opposite mod size and intensity lat defect, which is reversible and could represent ischemia or possibly artifact (SDS 4). LVEF 71% with normal  wall motion. Intermediate risk study. >> images reviewed with Dr. Dorris Carnes - no sig ischemia; med rx    Hx of cardiovascular stress test    Lexiscan Myoview 6/16:  EF 70%, no scar or ischemia; Low Risk   Hyperlipidemia    Hypertension    Neuropathy    Orthostatic hypotension 05/28/2017   OSA (obstructive sleep apnea) 01/16/2021   Osteoarthritis    Rheumatoid arthritis ([place])    RA (Dr. Ouida Sills) Bilateral hands   S/P angioplasty with stent 05/27/17 to LCX with DES  05/28/2017   Thyroid disease    Allergies  Allergen Reactions   Oxycodone-Acetaminophen Hives and Itching    hallucinations   Percocet [Oxycodone-Acetaminophen] Hives, Itching and Other (See Comments)    hallucinations   Pravastatin Sodium Other (See Comments)    Muscle aches Muscle aches   Hydrocodone Other (See Comments)    "crazy dreams"   Aspirin Other (See Comments)    GI upset Other reaction(s): Other (See Comments) REACTION: GI upset    Social History   Socioeconomic History   Marital status: Divorced    Spouse name: Not on file   Number of children:  3   Years of education: 12   Highest education level: Not on file  Occupational History   Occupation: Retired    Fish farm manager: RETIRED  Tobacco Use   Smoking status: Every Day    Packs/day: 0.20    Years: 15.00    Pack years: 3.00    Types: Cigarettes    Start date: 1970   Smokeless tobacco: Never   Tobacco comments:    pt has been an on and off smoker since age 89  Vaping Use   Vaping Use: Never used  Substance and Sexual Activity   Alcohol use: No    Alcohol/week: 0.0 standard drinks   Drug use: No   Sexual activity: Not on file  Other Topics Concern   Not on file  Social History Narrative   Single, dgtr lives with her   Current on/off smoker   Alcohol use- no   Drug use-no   Regular Exercise-yes   Social Determinants of Health   Financial Resource Strain: Low Risk    Difficulty of Paying Living Expenses: Not hard at all  Food Insecurity: No Food Insecurity   Worried About Charity fundraiser in the Last Year: Never true   [place] in the Last Year: Never true  Transportation Needs: No Transportation Needs   Lack of Transportation (Medical): No   Lack of Transportation (Non-Medical): No  Physical Activity: Inactive   Days of Exercise per Week: 0 days   Minutes of Exercise per Session: 0 min  Stress: No Stress Concern Present   Feeling of Stress : Not at all  Social Connections: Moderately Isolated   Frequency of Communication with Friends and Family: More than three times a week   Frequency of Social Gatherings with Friends and Family: More than three times a week   Attends Religious Services: More than 4 times per year   Active Member of Clubs or Organizations: No   Attends Archivist Meetings: Never   Marital Status: Divorced    Vitals:   04/17/21 1157  BP: 128/80  Pulse: 86  Resp: 16  SpO2: 98%   Body mass index is 36.8 kg/m.  Physical Exam Vitals and nursing note reviewed.  Constitutional:      General: She is not in acute  distress.  Appearance: She is well-developed.  HENT:     Head: Normocephalic and atraumatic.     Nose: Septal deviation present.     Right Turbinates: Enlarged.     Left Turbinates: Enlarged.     Mouth/Throat:     Mouth: Mucous membranes are moist.     Pharynx: Oropharynx is clear.  Eyes:     Conjunctiva/sclera: Conjunctivae normal.  Cardiovascular:     Rate and Rhythm: Normal rate and regular rhythm.     Pulses:          Dorsalis pedis pulses are 2+ on the right side and 2+ on the left side.     Heart sounds: No murmur heard. Pulmonary:     Effort: Pulmonary effort is normal. No respiratory distress.     Breath sounds: Normal breath sounds.  Abdominal:     Palpations: Abdomen is soft. There is no hepatomegaly or mass.     Tenderness: There is no abdominal tenderness.  Lymphadenopathy:     Cervical: No cervical adenopathy.  Skin:    General: Skin is warm.     Findings: No erythema or rash.  Neurological:     General: No focal deficit present.     Mental Status: She is alert and oriented to person, place, and time.     Cranial Nerves: No cranial nerve deficit.     Gait: Gait normal.  Psychiatric:     Comments: Well groomed, good eye contact.   ASSESSMENT AND PLAN:  Ms. Sharanda was seen today for fatigue and sinus congestion.  Diagnoses and all orders for this visit: Orders Placed This Encounter  Procedures   Flu Vaccine QUAD High Dose(Fluad)   POC HgB A1c   Lab Results  Component Value Date   HGBA1C 5.9 04/17/2021   Fatigue, unspecified type We discussed possible etiologies: Systemic illness, immunologic,endocrinology,sleep disorder, psychiatric/psychologic, infectious,medications side effects, and idiopathic. A few of her medical problems and medications can be contributing factors. I do not think blood work is needed at this time.  Type 2 diabetes mellitus with stage 3 chronic kidney disease, without long-term current use of insulin, unspecified whether stage 3a  or 3b CKD ([place]) HgA1C at goal. Continue non pharmacologic treatment. Regular exercise as tolerated and healthy diet with avoidance of added sugar food intake encouraged. Annual eye exam, periodic dental and foot care to continue. F/U in 5-6 months  Sleep apnea, unspecified type We discussed  possible complications if problem is not adequately treated. Wt loss will help. Pending CPAP. Following with pulmonologist.  Seasonal allergic rhinitis due to pollen Singulair 10 mg added today. Continue Flonase nasal spray daily prn and Claritin. Nasal saline irrigations as needed will also help.  B12 deficiency Here in the office and after verbal consent,she received B12 1000 mcg IM. Continue B12 1000 mcg 3 times per week.  -     cyanocobalamin ((VITAMIN B-12)) injection 1,000 mcg  Need for influenza vaccination -     Flu Vaccine QUAD High Dose(Fluad)  Hypertension with heart disease BP adequately controlled. Continue [name] 37.5-25 mg daily,Metoprolol succinate 100 mg daily,and Losartan 100 mg daily. Low salt diet to continue.  Polyneuropathy Appropriate foot care discussed. On Gabapentin 600 mg bid. Following with neurologist.  I spent a total of 51 minutes in both face to face and non face to face activities for this visit on the date of this encounter. During this time history was obtained and documented, examination was performed, prior labs reviewed, and assessment/plan discussed.  Return in about 6 months (around 10/15/2021).   Naseem Adler G. [name], MD  [facility]. [place] office.

## 2021-04-17 NOTE — Patient Instructions (Addendum)
A few things to remember from today's visit:  Type 2 diabetes mellitus with stage 3 chronic kidney disease, without long-term current use of insulin, unspecified whether stage 3a or 3b CKD ([place]) - Plan: POC HgB A1c  Sleep apnea, unspecified type  Seasonal allergic rhinitis due to pollen  B12 deficiency - Plan: cyanocobalamin ((VITAMIN B-12)) injection 1,000 mcg  Need for influenza vaccination - Plan: Flu Vaccine QUAD High Dose(Fluad)  Hypertension with heart disease  Polyneuropathy  If you need refills please call your pharmacy. Today we are resuming Montelukast 10 mg at bedtime for allergies and starting nasal saline irrigations. No changes in Claritin or Flonase.  B12 injection given. Continue B12 1000 mcg 3 times per week. CPAP will help with fatigue. Some meds and medical problems can also cause fatigue.  Continue good foot care.  Do not use My Chart to request refills or for acute issues that need immediate attention.    Please be sure medication list is accurate. If a new problem present, please set up appointment sooner than planned today.

## 2021-04-19 ENCOUNTER — Encounter: Payer: Self-pay | Admitting: Family Medicine

## 2021-04-21 ENCOUNTER — Telehealth: Payer: Self-pay | Admitting: Family Medicine

## 2021-04-21 ENCOUNTER — Ambulatory Visit: Payer: Medicare HMO | Admitting: Family Medicine

## 2021-04-21 MED ORDER — FREESTYLE LIBRE 2 SENSOR MISC: 3 refills | Status: DC

## 2021-04-21 MED ORDER — FREESTYLE LIBRE 2 READER DEVI: 2 refills | Status: DC

## 2021-04-21 NOTE — Telephone Encounter (Signed)
Rx's sent in. °

## 2021-04-21 NOTE — Telephone Encounter (Signed)
Patient called because she needs refills for her Free Style Luetta [place] callback number is [phone]    Please Advise

## 2021-04-28 DIAGNOSIS — H2513 Age-related nuclear cataract, bilateral: Secondary | ICD-10-CM | POA: Diagnosis not present

## 2021-04-28 DIAGNOSIS — E119 Type 2 diabetes mellitus without complications: Secondary | ICD-10-CM | POA: Diagnosis not present

## 2021-04-28 DIAGNOSIS — Z79899 Other long term (current) drug therapy: Secondary | ICD-10-CM | POA: Diagnosis not present

## 2021-04-28 DIAGNOSIS — H524 Presbyopia: Secondary | ICD-10-CM | POA: Diagnosis not present

## 2021-04-28 LAB — HM DIABETES EYE EXAM

## 2021-04-29 ENCOUNTER — Encounter: Payer: Self-pay | Admitting: Family Medicine

## 2021-05-01 ENCOUNTER — Other Ambulatory Visit: Payer: Self-pay | Admitting: Family Medicine

## 2021-05-01 ENCOUNTER — Other Ambulatory Visit (HOSPITAL_COMMUNITY): Payer: Medicare HMO

## 2021-05-02 LAB — SARS CORONAVIRUS 2 (TAT 6-24 HRS): SARS Coronavirus 2: NEGATIVE

## 2021-05-02 NOTE — Progress Notes (Signed)
HPI F Smoker (1 pk/ week, 15 pkyrs followed for OSA, Bronchiectasis, complicated by  Orthostasis, HTN, CAD/ stent, Allergic Rhinitis, GERD,  HST 02/09/21- AHI 50.5/ hr, desaturation to a nadir of 70%- average 87%, body weight 226 lbs PFT 05/04/21- Mild airtrapping but normal flows w/o resp to BD. DLCO is high for volume. ===============================================================  01/01/21- 74 yoF Smoker (1 pk/ week, 15 pkyrs)for sleep evaluation with concern of OSA and Bronchiectasis courtesy of Dr [name] Medical problem list includes Orthostasis, HTN, CAD/ stent, Allergic Rhinitis, GERD,  Esophageal Stricture, DM2,/ Neuropathy, Hyperlipidemia,  Rheumatoid Arthritis, Hx Renal Cancer/R Nephrectomy, CKD3, Tobacco user,  Spinal Stenosis Lumbar,  -Ventolin hfa, Singulair, Flonase, NPSG 10/07/18- AHI 65.8/ hr, desaturation to 89%, body weight 209 lbs Epworth score-7 Body weight today-226 lbs Covid vax- -----Pt is being referred by PCP due to sleep apnea and also due to CT scan and chest xray that she had done. Pt states that she does snore. Pt also has had a cough that is worse throughout the day and states she will occ cough up phlegm.  Daughter here. Son Nikeisha Klutz is our pt. Loud snoring. Witnessed apneas. Nonrefreshing sleep. Daytime tiredness. Denies ENT surgery, but has seen ENT for sinus congestion and dizziness w f/u pending.  Breathing- +shortness of breath mainly w exertion, productive cough. CT chest showed mild bronchiectasis and emphysema- discussed. Hx GERD but not recognizing significant aspiration event. CT chest 4/20/222-  IMPRESSION: 1. No acute findings in the chest. 2. Minimal emphysematous changes at the right lung apex. 3. Mild cylindrical bronchiectasis within the right middle lobe and bilateral lower lobes, which may be post infectious or post inflammatory. 4. Stable 3.0 cm left adrenal adenoma. Aortic Atherosclerosis (ICD10-I70.0) and Emphysema  (ICD10-J43.9).  05/04/21- 8 yoF Smoker (1 pk/ week, 15 pkyrs followed for OSA, Bronchiectasis, complicated by  Orthostasis, HTN, CAD/ stent, Allergic Rhinitis, GERD,  HST 02/09/21- AHI 50.5/ hr, desaturation to a nadir of 70%- average 87%, body weight 226 lbs Son is Delbert Vu -Albuterol hfa, Singulair, Flonase  No CPAP yet Body weight today-229 lbs Covid vax-1 Phizer Flu vax-had Still waiting for her CPAP machine. She continues light smoking and has mild bronchiectasis on chest CT from [place]. PFT 05/04/21- Mild airtrapping but normal flows w/o resp to BD. DLCO is high for volume.   ROS-see HPI   + = positive Constitutional:    weight loss, night sweats, fevers, chills, fatigue, lassitude. HEENT:    headaches, +difficulty swallowing, tooth/dental problems, sore throat,       sneezing, itching, ear ache, +nasal congestion, post nasal drip, snoring CV:    chest pain, orthopnea, PND, swelling in lower extremities, anasarca,      dizziness, palpitations Resp:   +shortness of breath with exertion or at rest.                +productive cough,   non-productive cough, coughing up of blood.              change in color of mucus.  wheezing.   Skin:    rash or lesions. GI:  No-   heartburn, indigestion, abdominal pain, nausea, vomiting, diarrhea,                 change in bowel habits, loss of appetite GU: dysuria, change in color of urine, no urgency or frequency.   flank pain. MS:   +joint pain, stiffness, decreased range of motion, back pain. Neuro-     +dizzy Psych:  change in mood or affect.  depression or anxiety.   memory loss.  OBJ- Physical Exam General- Alert, Oriented, Affect-appropriate, Distress- none acute, + obese Skin- rash-none, lesions- none, excoriation- none Lymphadenopathy- none Head- atraumatic            Eyes- Gross vision intact, PERRLA, conjunctivae and secretions clear            Ears- Hearing, canals-normal            Nose- Clear, no-Septal dev, mucus, polyps,  erosion, perforation             Throat- Mallampati III, mucosa clear , drainage- none, +tonsils, + teeth Neck- flexible , trachea midline, no stridor , thyroid nl, carotid no bruit Chest - symmetrical excursion , unlabored           Heart/CV- RRR , no murmur , no gallop  , no rub, nl s1 s2                           - JVD- none , edema- none, stasis changes- none, varices- none           Lung- +coarse, wheeze- none, cough- none , dullness-none, rub- none           Chest wall-  Abd-  Br/ Gen/ Rectal- Not done, not indicated Extrem- cyanosis- none, clubbing, none, atrophy- none, strength- nl Neuro- grossly intact to observation

## 2021-05-04 ENCOUNTER — Telehealth: Payer: Self-pay | Admitting: Internal Medicine

## 2021-05-04 ENCOUNTER — Ambulatory Visit: Payer: Medicare HMO | Admitting: Internal Medicine

## 2021-05-04 ENCOUNTER — Other Ambulatory Visit: Payer: Self-pay

## 2021-05-04 ENCOUNTER — Encounter: Payer: Self-pay | Admitting: Internal Medicine

## 2021-05-04 ENCOUNTER — Ambulatory Visit (INDEPENDENT_AMBULATORY_CARE_PROVIDER_SITE_OTHER): Payer: Medicare HMO | Admitting: Internal Medicine

## 2021-05-04 VITALS — BP 130/70 | HR 71 | Temp 97.2°F | Ht 66.0 in | Wt 229.0 lb

## 2021-05-04 DIAGNOSIS — G4733 Obstructive sleep apnea (adult) (pediatric): Secondary | ICD-10-CM

## 2021-05-04 DIAGNOSIS — J449 Chronic obstructive pulmonary disease, unspecified: Secondary | ICD-10-CM

## 2021-05-04 DIAGNOSIS — J479 Bronchiectasis, uncomplicated: Secondary | ICD-10-CM | POA: Diagnosis not present

## 2021-05-04 LAB — PULMONARY FUNCTION TEST
DL/VA % pred: 136 %
DL/VA: 5.51 ml/min/mmHg/L
DLCO cor % pred: 100 %
DLCO cor: 20.3 ml/min/mmHg
DLCO unc % pred: 100 %
DLCO unc: 20.3 ml/min/mmHg
FEF 25-75 Post: 3.59 L/sec
FEF 25-75 Pre: 4 L/sec
FEF2575-%Change-Post: -10 %
FEF2575-%Pred-Post: 228 %
FEF2575-%Pred-Pre: 254 %
FEV1-%Change-Post: 4 %
FEV1-%Pred-Post: 102 %
FEV1-%Pred-Pre: 98 %
FEV1-Post: 1.88 L
FEV1-Pre: 1.8 L
FEV1FVC-%Change-Post: 1 %
FEV1FVC-%Pred-Pre: 120 %
FEV6-%Change-Post: 3 %
FEV6-%Pred-Post: 89 %
FEV6-%Pred-Pre: 86 %
FEV6-Post: 2.04 L
FEV6-Pre: 1.97 L
FEV6FVC-%Pred-Post: 104 %
FEV6FVC-%Pred-Pre: 104 %
FVC-%Change-Post: 3 %
FVC-%Pred-Post: 86 %
FVC-%Pred-Pre: 83 %
FVC-Post: 2.04 L
FVC-Pre: 1.97 L
Post FEV1/FVC ratio: 93 %
Post FEV6/FVC ratio: 100 %
Pre FEV1/FVC ratio: 91 %
Pre FEV6/FVC Ratio: 100 %
RV % pred: 109 %
RV: 2.67 L
TLC % pred: 93 %
TLC: 4.98 L

## 2021-05-04 MED ORDER — TRELEGY ELLIPTA 100-62.5-25 MCG/INH IN AEPB: 1.0000 | INHALATION_SPRAY | Freq: Every day | RESPIRATORY_TRACT | 5 refills | Status: DC

## 2021-05-04 NOTE — Progress Notes (Signed)
Full PFT performed today. °

## 2021-05-04 NOTE — Patient Instructions (Signed)
Order-new DME, new CPAP auto 5-15, mask of choice, humidifier, supplies, AirView/ card  We will want to see you again 31-90 days after you get your new CPAP.  Order- referral to pharmacy smoking cessation program

## 2021-05-04 NOTE — Telephone Encounter (Signed)
Dr. Annamaria Boots would like to refer this pt for smoking sensation therapy.

## 2021-05-04 NOTE — Telephone Encounter (Signed)
Rx for Trelegy has been sent to preferred pharmacy for pt. Called and spoke with pt letting her know this had been done and she verbalized understanding. Nothing further needed. 

## 2021-05-04 NOTE — Patient Instructions (Signed)
Full PFT performed today. °

## 2021-05-05 ENCOUNTER — Telehealth: Payer: Self-pay | Admitting: Pharmacist

## 2021-05-05 NOTE — Chronic Care Management (AMB) (Signed)
Chronic Care Management Pharmacy Assistant   Name: [name]  MRN: [id number] DOB: 10/26/1945  [name] is an 75 y.o. year old female who presents for his initial CCM visit with the clinical pharmacist.  Reason for Encounter: Chart prep for initial visit with [name] the Clinical Pharmacist on 05/08/2021   Conditions to be addressed/monitored: CAD, HTN, COPD, DMII, and CKD Stage GERD and Rheumatoid arthritis  Recent office visits:  04/17/2021 Rose [name] MD (PCP) - Patient was seen for fatige and additional issues. Discontinued Duloxetine 30mg . No follow up noted.   11/26/2020 [name] MD (PCP) - Patient was seen for Headache and additional issues. Referral to Pulmonology and ENT. Started on Augmentin 875/125 mg twice daily and Singulair 10 mg daily. Follow up is symptoms worsen or fail to improve.  08/26/2020 Rose Neas LPN Annual Wellness Exam  Recent consult visits:  05/04/2021 Rose Lever, MD (Pulmonary Disease) - Patient was seen for obstructive sleep apnea. No medication changes. Follow up 30-90 days after new CPAP.  02/16/2021 Rose Spearman MD (Nuerology) - Patient was seen for Axonal neuropathy and additional issues. Changed Gabapentin from 100 mg 3 times daily to 600 mg 2 times daily. Follow up in 1 year.   02/09/2021 Rose Cassis PA-C [facility] ENT) - Patient was seen for headache around the eyes.  No medication changes. Follow up in 6 weeks.  01/06/2021 Rose Cassis PA-C [facility] ENT) - Patient was seen for headache around the eyes.  No medication changes. No follow up noted.  01/01/2021  [name] D, MD (Pulmonary Disease) - Patient was seen for obstructive sleep apnea. Started Fluticasone-Umeclidin-Valiant 100-62.5-25 1 puff daily. Referrals for Home sleep test.  Follow up 2 weeks after sleep test.   12/16/2020 Rose Cassis PA-C [facility] ENT) - Patient was seen for Maxillary sinusitis. Started on Augmentin 875/125 mg twice  daily X 10 days. No follow up noted.   Hospital visits:  Medication Reconciliation was completed by comparing discharge summary, patient's EMR and Pharmacy list, and upon discussion with patient.  Admitted to [facility] ED on 01/25/2021 due to Acute nonintractable headache. Discharge date was 01/25/2021. Discharged from [facility] ED.    New?Medications Started at [facility] Discharge:?? -None  Medication Changes at Hospital Discharge: -None  Medications Discontinued at Hospital Discharge: -None  Medications that remain the same after Hospital Discharge:??  -All other medications will remain the same.    Medications: Outpatient Encounter Medications as of 05/05/2021  Medication Sig   acetaminophen (TYLENOL) 500 MG tablet Take 1,000 mg by mouth as needed for moderate pain or headache.    albuterol (VENTOLIN HFA) 108 (90 Base) MCG/ACT inhaler INHALE ONE OR TWO PUFFS BY MOUTH INTO THE LUNGS EVERY SIX HOURS AS NEEDED FOR WHEEZING OR SHORTNESS OF BREATH   Alirocumab (PRALUENT) 75 MG/ML SOAJ Inject 75 mg into the skin every 14 (fourteen) days.   aspirin 81 MG chewable tablet Chew 81 mg by mouth daily.   clopidogrel (PLAVIX) 75 MG tablet Take 75 mg by mouth daily.   colchicine 0.6 MG tablet Take 0.5 tablets (0.3 mg total) by mouth daily.   Continuous Blood Gluc Receiver (FREESTYLE LIBRE 2 READER) DEVI Use to check blood sugar, dx:e11.9   Continuous Blood Gluc Sensor (FREESTYLE LIBRE 2 SENSOR) MISC Use to check blood sugar, dx:e11.9   ezetimibe (ZETIA) 10 MG tablet TAKE ONE TABLET BY MOUTH ONE TIME DAILY   famotidine (PEPCID) 20 MG tablet  Take 1 tablet (20 mg total) by mouth 2 (two) times daily.   fluticasone (FLONASE) 50 MCG/ACT nasal spray PLACE 2 SPRAYS INTO BOTH NOSTRILS AT BEDTIME AS NEEDED FOR ALLERGIES.   Fluticasone-Umeclidin-Vilant (TRELEGY ELLIPTA) 100-62.5-25 MCG/INH AEPB Inhale 1 puff into the lungs daily.   gabapentin (NEURONTIN) 300 MG capsule  Take 2 capsules (600 mg total) by mouth 2 (two) times daily.   losartan (COZAAR) 100 MG tablet TAKE ONE TABLET BY MOUTH ONE TIME DAILY   metoprolol succinate (TOPROL-XL) 100 MG 24 hr tablet Take 1 tablet (100 mg total) by mouth daily. Take with or immediately following a meal.   montelukast (SINGULAIR) 10 MG tablet Take 1 tablet (10 mg total) by mouth at bedtime.   MYRBETRIQ 25 MG TB24 tablet Take 25 mg by mouth daily.   nitroGLYCERIN (NITROSTAT) 0.4 MG SL tablet Place 1 tablet (0.4 mg total) under the tongue every 5 (five) minutes as needed for chest pain.   pantoprazole (PROTONIX) 40 MG tablet Take 1 tablet (40 mg total) by mouth daily.   polyethylene glycol powder (GLYCOLAX/MIRALAX) powder Take 17 g by mouth daily.   rosuvastatin (CRESTOR) 20 MG tablet Take 1 tablet (20 mg total) by mouth daily.   triamterene-hydrochlorothiazide (MAXZIDE-25) 37.5-25 MG tablet TAKE HALF TABLET BY MOUTH DAILY   No facility-administered encounter medications on file as of 05/05/2021.   Fill History: ALBUTEROL SULFATE HFA HFA INH 11/21/2020 25   PRALUENT 75MG /ML PEN 03/05/2021 28   EZETIMIBE 10MG  TAB 03/31/2021 90   FAMOTIDINE 20MG  TAB 03/23/2021 90   FLUTICASONE PROP 50 MCG SPRAY 02/10/2021 90   TRELEGY 100 ELLIPTA INH 05/04/2021 30   GABAPENTIN 300MG  CAP 02/16/2021 90   LOSARTAN POTASSIUM 100MG  TAB 02/10/2021 90   METOPROLOL SUCCINATE ER 100MG  ER TAB 03/03/2021 90   MYRBETRIQ 25MG  TAB 03/14/2021 30   MONTELUKAST SODIUM 10MG  TAB 02/20/2021 30   TRIAMTERENE/HYDROCHLOROTH 37.5-25 TAB 03/03/2021 90   Have you seen any other providers since your last visit? **no  Any changes in your medications or health? no  Any side effects from any medications? Yes, Patient has had low energy for a month and light spots on her neck and bumps coming up on her skin.  Do you have an symptoms or problems not managed by your medications? no  Any concerns about your health right now? Yes, Patient has had low  energy for a month and light spots on her neck and bumps coming up on her skin.  Has your provider asked that you check blood pressure, blood sugar, or follow special diet at home? Yes.  She states the pharmacy is working on getting approval for glucose sensors. Last BS reading were in a good range, she doesn't recall what it was.  Last BP readings were 130/79 yesterday and 127/69 on Sunday   Patient generally eats a variety, for breakfast it will be a banana, oatmeal, cereal, or a muffin and sometimes bacon and eggs. She doesn't eat lunch often and for dinner she will have broiled chicken, ground [place] or fish with a vegetable. She eats a lot of fruit when it is in season.   Do you get any type of exercise on a regular basis? No, patient will walk around her house and to the mailbox.  She states she has dizzy spells and low back pain that limit her activities.   Can you think of a goal you would like to reach for your health? Patient would like to loose 20 lbs and  quit smoking.  Do you have any problems getting your medications? Yes, patient states she is in the donut hole and her medications are becoming too expensive.  Is there anything that you would like to discuss during the appointment? Yes, patient would like to discuss options to decrease the cost of her medications.    Please bring blood pressure machine, medications and supplements to appointment  Notes: Patient  states Dr. [name] D/C her Rosuvastatin.  Care Gaps: AWV - scheduled 08/27/2021 Last BP reading was 130/70 on 05/04/2021 Last HGA1C was 5.9 on 04/17/2021 Shingrix - never done Foot exam - overdue Covid 19 vaccine booster 2 - overdue  Star Rating Drugs: Losartan 100 mg - last filled 02/10/2021 90DS at [place] Pharmacist Assistant [phone]

## 2021-05-07 ENCOUNTER — Telehealth: Payer: Self-pay | Admitting: Pharmacist

## 2021-05-07 NOTE — Chronic Care Management (AMB) (Signed)
    Chronic Care Management Pharmacy Assistant   Name: [name]  MRN: [id number] DOB: [birth year]  05/08/2021 APPOINTMENT REMINDER   [name] was reminded to have blood pressure meter,  all medications, supplements and any blood glucose and blood pressure readings available for review with [name], Pharm. D, at her office visit on 05/08/2021 at 10;30.   Questions: Have you had any recent office visit or specialist visit outside of [facility]? No  Are there any concerns you would like to discuss during your office visit?  Yes, patient would like to discuss options to decrease the cost of her medications. Patient has had low energy for a month and light spots on her neck and bumps coming up on her skin.  Are you having any problems obtaining your medications? (Whether it pharmacy issues or cost) Yes, patient states she is in the donut hole and her medications are becoming too expensive.  If patient has any PAP medications ask if they are having any problems getting their PAP medication or refill? No  Care Gaps: AWV - scheduled 08/27/2021 Last BP reading was 130/70 on 05/04/2021 Last HGA1C was 5.9 on 04/17/2021 Shingrix - never done Foot exam - overdue Covid 19 vaccine booster 2 - overdue  Star Rating Drug: Losartan 100 mg - last filled 02/10/2021 90DS at [facility]  Any gaps in medications fill history? No    [place]  Catering manager [phone]

## 2021-05-08 ENCOUNTER — Ambulatory Visit (INDEPENDENT_AMBULATORY_CARE_PROVIDER_SITE_OTHER): Payer: Medicare HMO | Admitting: Pharmacist

## 2021-05-08 ENCOUNTER — Other Ambulatory Visit: Payer: Self-pay

## 2021-05-08 DIAGNOSIS — I119 Hypertensive heart disease without heart failure: Secondary | ICD-10-CM

## 2021-05-08 DIAGNOSIS — E1169 Type 2 diabetes mellitus with other specified complication: Secondary | ICD-10-CM

## 2021-05-08 NOTE — Patient Instructions (Addendum)
Hi Caysie,  It was great to get to meet you in person! Don't forget to move metoprolol, gabapentin and rosuvastatin to bedtime to help simplify your medication schedule.  Please reach out to me if you have any questions or need anything before our follow up!  Best, Maddie  [name], PharmD, [place] [facility]  Visit Information   Goals Addressed             This Visit's Progress    Manage My Medicine       Timeframe:  Long-Range Goal Priority:  Medium Start Date:                             Expected End Date:                       Follow Up Date 08/31/21    - call for medicine refill 2 or 3 days before it runs out - keep a list of all the medicines I take; vitamins and herbals too - use a pillbox to sort medicine - use an alarm clock or phone to remind me to take my medicine    Why is this important?   These steps will help you keep on track with your medicines.   Notes:        Patient Care Plan: CCM Pharmacy Care Plan     Problem Identified: Problem: Hypertension, Hyperlipidemia, Diabetes, Coronary Artery Disease, GERD, COPD, Tobacco use, Overactive Bladder, Gout, and Allergic Rhinitis      Long-Range Goal: Patient-Specific Goal   Start Date: 05/08/2021  Expected End Date: 05/08/2022  This Visit's Progress: On track  Priority: High  Note:   Current Barriers:  Unable to independently afford treatment regimen Unable to independently monitor therapeutic efficacy Unable to self administer medications as prescribed  Pharmacist Clinical Goal(s):  Patient will verbalize ability to afford treatment regimen achieve adherence to monitoring guidelines and medication adherence to achieve therapeutic efficacy through collaboration with PharmD and provider.   Interventions: 1:1 collaboration with [name], [name], MD regarding development and update of comprehensive plan of care as evidenced by provider  attestation and co-signature Inter-disciplinary care team collaboration (see longitudinal plan of care) Comprehensive medication review performed; medication list updated in electronic medical record  Hypertension (BP goal <130/80) -Controlled -Current treatment: [name] 37.5-25 mg 1/2 tablet daily Losartan 100 mg  1 tablet daily Metoprolol succinate 100 mg 1 tablet daily -Medications previously tried: n/a  -Current home readings: 126/67 hr 68, 119/63, 140/77, 119/63, 139/82, 136/68, 136/80, 148/87 (usually in morning) -Current dietary habits: cooks with regular salt seldom; uses seasoned salt; doesn't buy much canned -Current exercise habits: not doing much right now -Denies hypotensive/hypertensive symptoms -Educated on BP goals and benefits of medications for prevention of heart attack, stroke and kidney damage; Exercise goal of 150 minutes per week; Importance of home blood pressure monitoring; Proper BP monitoring technique; -Counseled to monitor BP at home a few times weekly, document, and provide log at future appointments -Counseled on diet and exercise extensively Recommended to continue current medication  Hyperlipidemia: (LDL goal < 70) -Controlled -Current treatment: Rosuvastatin 20 mg 1 tablet daily - sometimes takes 1/2 tablet Ezetimibe 10 mg 1 tablet daily Praluent 75 mg inject every 14 days -Medications previously tried: none  -Current dietary patterns: tries to stay away from fried foods and usually has baked and broiled and doesn't eat beef  much -Current exercise habits: not much right now -Educated on Cholesterol goals;  Benefits of statin for ASCVD risk reduction; Importance of limiting foods high in cholesterol; Exercise goal of 150 minutes per week; -Counseled on diet and exercise extensively Recommended to continue current medication Recommended to take 1 full tablet of rosuvastatin daily at least until next lipid panel. Will send message to Dr.  Harrington Challenger about possibly stopping Zetia if LDL is at goal.  CAD (Goal: prevent heart events) -Controlled -Current treatment  Metoprolol succinate 100 mg 1 tablet daily Rosuvastatin 20 mg 1 tablet daily - sometimes takes 1/2 tablet Aspirin 81 mg 1 tablet daily Nitroglycerin 0.4 mg 1 tablet as needed -Medications previously tried: none  -Recommended to continue current medication   Diabetes (A1c goal <6.5%) -Controlled -Current medications: No medications -Medications previously tried: n/a  -Current home glucose readings fasting glucose: uses a freestyle libre post prandial glucose: freestyle libre -Denies hypoglycemic/hyperglycemic symptoms -Current meal patterns:  breakfast: n/a  lunch: n/a  dinner: n/a snacks: n/a drinks: water and 2 sodas a week -Current exercise: not much right now -Educated on A1c and blood sugar goals; Exercise goal of 150 minutes per week; Counseled to check feet daily and get yearly eye exams -Counseled to check feet daily and get yearly eye exams -Counseled on diet and exercise extensively  COPD (Goal: control symptoms and prevent exacerbations) -Controlled -Current treatment  Trelegy 100-62.5-25 mcg/inh inhale 1 puff daily at bedtime Albuterol as needed -Medications previously tried: unknown  -Gold Grade: Gold 1 (FEV1>80%) -Current COPD Classification:  A (low sx, <2 exacerbations/yr) -MMRC/CAT score: n/a -Pulmonary function testing: 05/04/21 -Exacerbations requiring treatment in last 6 months: none -Patient reports consistent use of maintenance inhaler -Frequency of rescue inhaler use: depends on weather (twice a week usually) -Counseled on Proper inhaler technique; Benefits of consistent maintenance inhaler use When to use rescue inhaler -Counseled on diet and exercise extensively Recommended to continue current medication Assessed patient finances. Patient likely won't qualify for Trelegy PAP due to living with daughter.  Allergic rhinitis  (Goal: minimize symptoms) -Controlled -Current treatment  Montelukast 10 mg 1 tablet at bedtime Fluticasone 50 mcg/act as needed -Medications previously tried: none  -Recommended restarting montelukast as patient ran out and was unaware.  Tobacco use (Goal quit smoking) -Uncontrolled -Previous quit attempts: n/a -Current treatment  No medications -Patient smokes After 30 minutes of waking -Patient triggers include: anxiety -On a scale of 1-10, reports MOTIVATION to quit is 4 -On a scale of 1-10, reports CONFIDENCE in quitting is 5 -Provided contact information for [place] Quit Line (1-800-QUIT-NOW) and encouraged patient to reach out to this group for support. - Counseled on benefits of smoking cessation.  GERD (Goal: minimize symptoms of heartburn) -Controlled -Current treatment  Famotidine 20 mg 1 tablet twice daily Pantoprazole 40 mg 1 tablet daily -Medications previously tried: none  -Counseled on non-pharmacologic management of symptoms such as elevating the head of your bed, avoiding eating 2-3 hours before bed, avoiding triggering foods such as acidic, spicy, or fatty foods, eating smaller meals, and wearing clothes that are loose around the waist Recommended reducing famotidine to daily due to kidney function.  Neuropathy (Goal: minimize symptoms) -Not ideally controlled -Current treatment  Gabapentin 300 mg 2 capsules twice daily -Medications previously tried: none  -Recommended moving second dose of gabapentin to bedtime as this can cause some daytime fatigue.  Overactive bladder (Goal: minimize symptoms) -Controlled -Current treatment  Myrbetriq 25 mg 1 tablet daily - not taking due to cost -  Medications previously tried: tolterodine (side effects) -Recommended reaching out to urology for samples or an alternative. Assessed patient finances. Patient would not qualify for PAP as she has insurance coverage.  Health Maintenance -Vaccine gaps: shingrix, COVID vaccine  (She is getting the booster) -Current therapy:  Miralax as needed every other day Acetaminophen 500 mg as needed -Educated on Cost vs benefit of each product must be carefully weighed by individual consumer -Patient is satisfied with current therapy and denies issues -Recommended to continue current medication  Patient Goals/Self-Care Activities Patient will:  - take medications as prescribed as evidenced by patient report and record review focus on medication adherence by using adherence packaging check blood pressure weekly, document, and provide at future appointments target a minimum of 150 minutes of moderate intensity exercise weekly  Follow Up Plan: Telephone follow up appointment with care management team member scheduled for: 4 months      Ms. Scalzo was given information about Chronic Care Management services today including:  CCM service includes personalized support from designated clinical staff supervised by her physician, including individualized plan of care and coordination with other care providers 24/7 contact phone numbers for assistance for urgent and routine care needs. Standard insurance, coinsurance, copays and deductibles apply for chronic care management only during months in which we provide at least 20 minutes of these services. Most insurances cover these services at 100%, however patients may be responsible for any copay, coinsurance and/or deductible if applicable. This service may help you avoid the need for more expensive face-to-face services. Only one practitioner may furnish and bill the service in a calendar month. The patient may stop CCM services at any time (effective at the end of the month) by phone call to the office staff.  Patient agreed to services and verbal consent obtained.   Patient verbalizes understanding of instructions provided today and agrees to view in [place].  Telephone follow up appointment with pharmacy team member scheduled  for: 4 months  Viona Gilmore, [facility]

## 2021-05-08 NOTE — Progress Notes (Signed)
Chronic Care Management Pharmacy Note  05/31/2021 Name:  [name] MRN:  [id number] DOB:  1946/03/05  Summary: Pt was missing some of her medications BP elevated in office LDL at goal < 70  Recommendations/Changes made from today's visit: -Recommended moving gabapentin, rosuvastatin and metoprolol to bedtime to simplify regimen -[facility] for adherence packaging -Recommend decreasing famotidine to once daily due to kidney function -Recommended taking 1 full of tablet of rosuvastatin and will reach out to Dr. Harrington Challenger about simplifying lipid medication regimen  Plan: Call her current pharmacy to fill 30 days supply of rosuvastatin, famotidine, and montelukast  BP assessment in 1-2 months   Subjective: [name] is an 75 y.o. year old female who is a primary patient of [name], Malka So, MD.  The CCM team was consulted for assistance with disease management and care coordination needs.    Engaged with patient face to face for initial visit in response to provider referral for pharmacy case management and/or care coordination services.   Consent to Services:  The patient was given the following information about Chronic Care Management services today, agreed to services, and gave verbal consent: 1. CCM service includes personalized support from designated clinical staff supervised by the primary care provider, including individualized plan of care and coordination with other care providers 2. 24/7 contact phone numbers for assistance for urgent and routine care needs. 3. Service will only be billed when office clinical staff spend 20 minutes or more in a month to coordinate care. 4. Only one practitioner may furnish and bill the service in a calendar month. 5.The patient may stop CCM services at any time (effective at the end of the month) by phone call to the office staff. 6. The patient will be responsible for cost sharing (co-pay) of up to 20% of the service fee (after  annual deductible is met). Patient agreed to services and consent obtained.  Patient Care Team: [name], [name], MD as PCP - General (Family Medicine) Fay Records, MD as PCP - Cardiology (Cardiology) Viona Gilmore, [facility] as Pharmacist (Pharmacist)  Recent office visits: 04/17/2021 [name] MD (PCP) - Patient was seen for fatige and additional issues. Discontinued Duloxetine 76m. No follow up noted.    11/26/2020 JMartiniqueMD (PCP) - Patient was seen for Headache and additional issues. Referral to Pulmonology and ENT. Started on Augmentin 875/125 mg twice daily and Singulair 10 mg daily. Follow up is symptoms worsen or fail to improve.   08/26/2020 SOfilia NeasLPN Annual Wellness Exam  Recent consult visits: 05/04/2021 YDeneise Lever MD (Pulmonary Disease) - Patient was seen for obstructive sleep apnea. No medication changes. Follow up 30-90 days after new CPAP.   02/16/2021 VAndrey SpearmanMD (Nuerology) - Patient was seen for Axonal neuropathy and additional issues. Changed Gabapentin from 100 mg 3 times daily to 600 mg 2 times daily. Follow up in 1 year.   02/09/2021 [facility]iet[facility]PA-C [facility] - Patient was seen for headache around the eyes.  No medication changes. Follow up in 6 weeks.   01/06/2021 [facility]iet[facility]PA[facility] - Patient was seen for headache around the eyes.  No medication changes. No follow up noted.   01/01/2021  YBaird LyonsD, MD (Pulmonary Disease) - Patient was seen for obstructive sleep apnea. Started Fluticasone-Umeclidin-Valiant 100-62.5-25 1 puff daily. Referrals for Home sleep test.  Follow up 2 weeks after sleep test.    12/16/2020 DPietro CassisPA-C ([facility]) - Patient was seen for Maxillary sinusitis. Started on Augmentin 875/125 mg twice daily X 10 days. No follow up noted.  Hospital visits: Medication Reconciliation was completed by comparing discharge summary, patient's EMR and Pharmacy list, and upon  discussion with patient.   Admitted to [facility] ED on 01/25/2021 due to Acute nonintractable headache. Discharge date was 01/25/2021. Discharged from [facility] ED.     New?Medications Started at [facility] Discharge:?? -None   Medication Changes at Hospital Discharge: -None   Medications Discontinued at Hospital Discharge: -None   Medications that remain the same after Hospital Discharge:??  -All other medications will remain the same.     Objective:  Lab Results  Component Value Date   CREATININE 1.47 (H) 05/19/2021   BUN 23 05/19/2021   GFR 51.41 (L) 09/26/2019   GFRNONAA 37 (L) 05/19/2021   GFRAA 40 (L) 07/07/2020   NA 139 05/19/2021   K 3.8 05/19/2021   CALCIUM 10.0 05/19/2021   CO2 26 05/19/2021   GLUCOSE 153 (H) 05/19/2021    Lab Results  Component Value Date/Time   HGBA1C 5.9 04/17/2021 12:47 PM   HGBA1C 6.6 (H) 11/10/2020 09:26 AM   HGBA1C 6.3 (H) 03/26/2020 10:41 AM   FRUCTOSAMINE 226 11/10/2020 09:26 AM   FRUCTOSAMINE 257 03/26/2019 10:54 AM   GFR 51.41 (L) 09/26/2019 01:02 PM   GFR 59.18 (L) 03/26/2019 11:43 AM   MICROALBUR 5.8 03/26/2020 10:55 AM   MICROALBUR 16.9 (H) 09/26/2019 01:02 PM    Last diabetic Eye exam:  Lab Results  Component Value Date/Time   HMDIABEYEEXA No Retinopathy 04/28/2021 12:00 AM    Last diabetic Foot exam: No results found for: HMDIABFOOTEX   Lab Results  Component Value Date   CHOL 143 09/11/2020   HDL 56 09/11/2020   LDLCALC 69 09/11/2020   LDLDIRECT 171.5 12/06/2011   TRIG 98 09/11/2020   CHOLHDL 2.6 09/11/2020    Hepatic Function Latest Ref Rng & Units 05/19/2021 01/25/2021 11/10/2020  Total Protein 6.5 - 8.1 g/dL 8.0 7.7 6.9  Albumin 3.5 - 5.0 g/dL 4.0 3.5 -  AST 15 - 41 U/L 15 21 -  ALT 0 - 44 U/L 9 14 -  Alk Phosphatase 38 - 126 U/L 60 59 -  Total Bilirubin 0.3 - 1.2 mg/dL 0.5 0.6 -  Bilirubin, Direct 0.00 - 0.40 mg/dL - - -    Lab Results  Component Value Date/Time    TSH 2.91 11/10/2020 09:26 AM   TSH 1.87 09/26/2019 01:02 PM    CBC Latest Ref Rng & Units 05/19/2021 01/25/2021 09/01/2020  WBC 4.0 - 10.5 K/uL 7.2 7.6 8.4  Hemoglobin 12.0 - 15.0 g/dL 15.1(H) 15.1(H) 13.7  Hematocrit 36.0 - 46.0 % 45.9 45.4 41.8  Platelets 150 - 400 K/uL 242 260 216    Lab Results  Component Value Date/Time   VD25OH 28 (L) 03/26/2020 10:41 AM   VD25OH 17.09 (L) 09/26/2019 01:02 PM   VD25OH 15.71 (L) 03/26/2019 10:54 AM    Clinical ASCVD: Yes  The ASCVD Risk score (Arnett DK, et al., 2019) failed to calculate for the following reasons:   The patient has a prior MI or stroke diagnosis    Depression screen [facility] 2/9 08/26/2020 03/26/2019  Decreased Interest 0 0  Down, Depressed, Hopeless 0 1  PHQ - 2 Score 0 1  Some recent data might be hidden     Social History   Tobacco Use  Smoking Status Every Day  Packs/day: 0.20   Years: 15.00   Pack years: 3.00   Types: Cigarettes   Start date: 1970  Smokeless Tobacco Never  Tobacco Comments   pt has been an on and off smoker since age 86   BP Readings from Last 3 Encounters:  05/19/21 139/79  05/04/21 130/70  04/17/21 128/80   Pulse Readings from Last 3 Encounters:  05/19/21 (!) 58  05/04/21 71  04/17/21 86   Wt Readings from Last 3 Encounters:  05/19/21 225 lb (102.1 kg)  05/04/21 229 lb (103.9 kg)  04/17/21 228 lb (103.4 kg)   BMI Readings from Last 3 Encounters:  05/19/21 36.32 kg/m  05/04/21 36.96 kg/m  04/17/21 36.80 kg/m    Assessment/Interventions: Review of patient past medical history, allergies, medications, health status, including review of consultants reports, laboratory and other test data, was performed as part of comprehensive evaluation and provision of chronic care management services.   SDOH:  (Social Determinants of Health) assessments and interventions performed: Yes SDOH Interventions    Flowsheet Row Most Recent Value  SDOH Interventions   Financial Strain  Interventions Intervention Not Indicated  Transportation Interventions Intervention Not Indicated      SDOH Screenings   Alcohol Screen: Low Risk    Last Alcohol Screening Score (AUDIT): 1  Depression (PHQ2-9): Low Risk    PHQ-2 Score: 0  Financial Resource Strain: Low Risk    Difficulty of Paying Living Expenses: Not very hard  Food Insecurity: No Food Insecurity   Worried About Charity fundraiser in the Last Year: Never true   Ran Out of Food in the Last Year: Never true  Housing: Low Risk    Last Housing Risk Score: 0  Physical Activity: Inactive   Days of Exercise per Week: 0 days   Minutes of Exercise per Session: 0 min  Social Connections: Moderately Isolated   Frequency of Communication with Friends and Family: More than three times a week   Frequency of Social Gatherings with Friends and Family: More than three times a week   Attends Religious Services: More than 4 times per year   Active Member of [employer] or Organizations: No   Attends Music therapist: Never   Marital Status: Divorced  Stress: No Stress Concern Present   Feeling of Stress : Not at all  Tobacco Use: High Risk   Smoking Tobacco Use: Every Day   Smokeless Tobacco Use: Never   Passive Exposure: Not on file  Transportation Needs: No Transportation Needs   Lack of Transportation (Medical): No   Lack of Transportation (Non-Medical): No    Patient reports that she never plans her days and has a lot going on with her family that draws the attention away from her own errands. Patient is one of 7 [place] She has a son on dialysis and lives on his own and is on dialysis. She lives with her daughter who works from home. She is retired but makes herself busy throughout the day and sometimes doesn't leave her house. She enjoys watching TV and going shopping at [employer], [employer] or [employer]. She goes to church on Sundays.  She reports she is not sleeping well but is going to  get a CPAP machine to help with this. She has been waiting for the machine for a while.   She also reports she has arthritis in her back and gets injections 3 months. PreCOVID, she was going to the [facility] 3  times a week to do water aerobics, which is covered with silver sneakers, but she is not going now.  Patient mostly eats at home but does eat takeout about 3 times a week. She tries to stay away from fried foods and usually has baked and broiled and doesn't eat beef much. She usually eats chicken [place] and seafood and loves vegetables. She also loves pasta, but knows she should limit this. She is trying to get her weight < 200 lbs. She does eat some desserts. She drinks water and is trying to stay away from regular pepsi (2 sodas this week and drinks tea when she goes out to eat.  Her biggest concerns with her medications right now is that she gets tired easily and was wondering if this was due to one of them and that she is in the donut hole and having difficulty affording her medications.  CCM Care Plan  Allergies  Allergen Reactions   Oxycodone-Acetaminophen Hives and Itching    hallucinations   Percocet [Oxycodone-Acetaminophen] Hives, Itching and Other (See Comments)    hallucinations   Pravastatin Sodium Other (See Comments)    Muscle aches Muscle aches   Hydrocodone Other (See Comments)    "crazy dreams"   Aspirin Other (See Comments)    GI upset Other reaction(s): Other (See Comments) REACTION: GI upset    Medications Reviewed Today     Reviewed by Eino Farber, CPhT (Pharmacy Technician) on 05/19/21 at [place] List Status: Complete   Medication Order Taking? Sig Documenting Provider Last Dose Status Informant  acetaminophen (TYLENOL) 500 MG tablet [id number]  Take 1,000 mg by mouth as needed for moderate pain or headache.  [provider]  Active Self           Med Note Eino Farber   Tue May 19, 2021  3:38 PM) prn  albuterol (VENTOLIN HFA) 108  (90 Base) MCG/ACT inhaler [id number] Yes INHALE ONE OR TWO PUFFS BY MOUTH INTO THE LUNGS EVERY SIX HOURS AS NEEDED FOR WHEEZING OR SHORTNESS OF Nicholes Mango [name], [name], MD 05/18/2021 Active Self  Alirocumab (PRALUENT) 75 MG/ML SOAJ [id number] Yes Inject 75 mg into the skin every 14 (fourteen) days. Fay Records, MD Past Month Active Self  aspirin 81 MG chewable tablet [id number] Yes Chew 81 mg by mouth daily. [provider] 05/19/2021 Active Self  Continuous Blood Gluc Receiver (FREESTYLE LIBRE 2 READER) DEVI [id number]  Use to check blood sugar, dx:e11.9 [name], [name], MD  Active Self  Continuous Blood Gluc Sensor (FREESTYLE LIBRE 2 SENSOR) [place] [id number]  Use to check blood sugar, dx:e11.9 [name], [name], MD  Active Self  ezetimibe (ZETIA) 10 MG tablet [id number] Yes Take 1 tablet (10 mg total) by mouth daily. Fay Records, MD 05/19/2021 Active Self  famotidine (PEPCID) 20 MG tablet [id number] Yes Take 1 tablet (20 mg total) by mouth 2 (two) times daily. [name], [name], MD 05/18/2021 Active Self  fluticasone (FLONASE) 50 MCG/ACT nasal spray [id number] Yes PLACE 2 SPRAYS INTO BOTH NOSTRILS AT BEDTIME AS NEEDED FOR ALLERGIES. [name], [name], MD 05/18/2021 Active Self  Fluticasone-Umeclidin-Vilant (TRELEGY ELLIPTA) 100-62.5-25 MCG/INH AEPB [id number] Yes Inhale 1 puff into the lungs daily. Baird [name] D, MD 05/18/2021 Active Self  gabapentin (NEURONTIN) 300 MG capsule [id number] Yes Take 2 capsules (600 mg total) by mouth 2 (two) times daily. Penni Bombard, MD 05/18/2021 Active Self  losartan (COZAAR) 100 MG tablet [id number] Yes TAKE ONE TABLET BY MOUTH ONE  TIME DAILY [name], [name], MD 05/19/2021 Active Self  metoprolol succinate (TOPROL-XL) 100 MG 24 hr tablet [id number] Yes Take 1 tablet (100 mg total) by mouth daily. Take with or immediately following a meal. [name], [name], MD 05/19/2021 Active Self  montelukast (SINGULAIR) 10 MG tablet [id number] Yes Take 1 tablet (10 mg total) by  mouth at bedtime. [name], [name], MD 05/18/2021 Active Self  MYRBETRIQ 25 MG TB24 tablet [id number]  Take 25 mg by mouth daily.  Patient not taking: Reported on 05/08/2021   [provider]  Active Self           Med Note Eino Farber   Tue May 19, 2021  3:37 PM) Patient was taking but in the donut hole with insurance,too $ high  nitroGLYCERIN (NITROSTAT) 0.4 MG SL tablet [id number]  Place 1 tablet (0.4 mg total) under the tongue every 5 (five) minutes as needed for chest pain. Fay Records, MD  Active Self           Med Note Eino Farber   Tue May 19, 2021  3:37 PM) prn  pantoprazole (PROTONIX) 40 MG tablet [id number] Yes Take 1 tablet (40 mg total) by mouth daily. Darliss Cheney, MD 05/19/2021 Active Self  polyethylene glycol powder (GLYCOLAX/MIRALAX) powder [id number] Yes Take 17 g by mouth daily. Levin Erp, [place] Past Week Active Self  rosuvastatin (CRESTOR) 20 MG tablet [id number] Yes Take 1 tablet (20 mg total) by mouth daily. Fay Records, MD 05/19/2021 Active Self  triamterene-hydrochlorothiazide (MAXZIDE-25) 37.5-25 MG tablet [id number] Yes Take 0.5 tablets by mouth daily. Fay Records, MD 05/19/2021 Active Self            Patient Active Problem List   Diagnosis Date Noted   OSA (obstructive sleep apnea) 01/16/2021   COPD mixed type ([place]) 01/16/2021   Fatigue 11/03/2020   B12 deficiency 11/03/2020   Chest pain 08/30/2020   Malignant neoplasm of kidney excluding renal pelvis, unspecified laterality ([place]) 09/26/2019   Vitamin D deficiency, unspecified 09/26/2019   Anxiety disorder, unspecified 03/26/2019   Tobacco use disorder 09/29/2018   Cervical myelopathy ([place]) 05/29/2018   ANA positive 12/08/2017   Cervicalgia 09/16/2017   Carpal tunnel syndrome of left wrist 07/21/2017   Chondrocalcinosis 07/21/2017   Neuropathy 07/21/2017   Orthostatic hypotension 05/28/2017   S/P angioplasty with stent 05/27/17 to LCX with DES  05/28/2017   CAD  (coronary artery disease)    Polyneuropathy 05/25/2017   Chest pain with moderate risk for cardiac etiology 05/25/2017   Class 1 obesity with serious comorbidity and body mass index (BMI) of 34.0 to 34.9 in adult 12/06/2016   CKD (chronic kidney disease) stage 3, GFR 30-59 ml/min (HCC) 10/11/2016   Hot flashes, menopausal 10/01/2016   Chest pain with moderate risk of acute coronary syndrome 10/25/2014   Degenerative spondylolisthesis 10/15/2014   Spinal stenosis, lumbar region, with neurogenic claudication 02/12/2013   DM (diabetes mellitus), type 2 with renal complications ([place]) 07/25/8249   History of Rtr nephrectomy 2010, secondary to renal cell cancer 01/14/2009   PERSONAL HX COLONIC POLYPS 11/14/2008   Hyperlipidemia associated with type 2 diabetes mellitus ([place]) 11/16/2007   ESOPHAGEAL STRICTURE 10/12/2007   Rheumatoid arthritis ([place]) 02/27/2007   Hypertension with heart disease 01/19/2007   Allergic rhinitis 01/19/2007   GERD 01/19/2007    Immunization History  Administered Date(s) Administered   Fluad Quad(high Dose 65+) 05/05/2019, 06/09/2020, 04/17/2021   Influenza Split 05/23/2012, 06/07/2013   Influenza Whole  08/02/2005, 05/08/2007, 05/09/2008, 08/19/2009, 06/03/2010   Influenza, High Dose Seasonal PF 06/20/2014, 05/13/2015, 06/08/2016, 05/23/2017, 05/18/2018   PFIZER Comirnaty(Gray Top)Covid-19 Tri-Sucrose Vaccine 12/17/2020   Pneumococcal Conjugate-13 02/27/2007   Pneumococcal Polysaccharide-23 02/27/2007, 12/16/2011, 10/16/2014, 05/26/2017   Td 08/02/2001   Tdap 12/16/2011   Zoster, Live 12/27/2012    Conditions to be addressed/monitored:  Hypertension, Hyperlipidemia, Diabetes, Coronary Artery Disease, GERD, COPD, Tobacco use, Overactive Bladder, Gout, and Allergic Rhinitis  Care Plan : [place]  Updates made by Viona Gilmore, [place] since 05/31/2021 12:00 AM     Problem: Problem: Hypertension, Hyperlipidemia, Diabetes, Coronary Artery Disease,  GERD, COPD, Tobacco use, Overactive Bladder, Gout, and Allergic Rhinitis      Long-Range Goal: Patient-Specific Goal   Start Date: 05/08/2021  Expected End Date: 05/08/2022  This Visit's Progress: On track  Priority: High  Note:   Current Barriers:  Unable to independently afford treatment regimen Unable to independently monitor therapeutic efficacy Unable to self administer medications as prescribed  Pharmacist Clinical Goal(s):  Patient will verbalize ability to afford treatment regimen achieve adherence to monitoring guidelines and medication adherence to achieve therapeutic efficacy through collaboration with PharmD and provider.   Interventions: 1:1 collaboration with [name], [name], MD regarding development and update of comprehensive plan of care as evidenced by provider attestation and co-signature Inter-disciplinary care team collaboration (see longitudinal plan of care) Comprehensive medication review performed; medication list updated in electronic medical record  Hypertension (BP goal <130/80) -Controlled -Current treatment: [name] 37.5-25 mg 1/2 tablet daily Losartan 100 mg  1 tablet daily Metoprolol succinate 100 mg 1 tablet daily -Medications previously tried: n/a  -Current home readings: 126/67 hr 68, 119/63, 140/77, 119/63, 139/82, 136/68, 136/80, 148/87 (usually in morning) -Current dietary habits: cooks with regular salt seldom; uses seasoned salt; doesn't buy much canned -Current exercise habits: not doing much right now -Denies hypotensive/hypertensive symptoms -Educated on BP goals and benefits of medications for prevention of heart attack, stroke and kidney damage; Exercise goal of 150 minutes per week; Importance of home blood pressure monitoring; Proper BP monitoring technique; -Counseled to monitor BP at home a few times weekly, document, and provide log at future appointments -Counseled on diet and exercise extensively Recommended to  continue current medication  Hyperlipidemia: (LDL goal < 70) -Controlled -Current treatment: Rosuvastatin 20 mg 1 tablet daily - sometimes takes 1/2 tablet Ezetimibe 10 mg 1 tablet daily Praluent 75 mg inject every 14 days -Medications previously tried: none  -Current dietary patterns: tries to stay away from fried foods and usually has baked and broiled and doesn't eat beef much -Current exercise habits: not much right now -Educated on Cholesterol goals;  Benefits of statin for ASCVD risk reduction; Importance of limiting foods high in cholesterol; Exercise goal of 150 minutes per week; -Counseled on diet and exercise extensively Recommended to continue current medication Recommended to take 1 full tablet of rosuvastatin daily at least until next lipid panel. Will send message to Dr. Harrington Challenger about possibly stopping Zetia if LDL is at goal.  CAD (Goal: prevent heart events) -Controlled -Current treatment  Metoprolol succinate 100 mg 1 tablet daily Rosuvastatin 20 mg 1 tablet daily - sometimes takes 1/2 tablet Aspirin 81 mg 1 tablet daily Nitroglycerin 0.4 mg 1 tablet as needed -Medications previously tried: none  -Recommended to continue current medication   Diabetes (A1c goal <6.5%) -Controlled -Current medications: No medications -Medications previously tried: n/a  -Current home glucose readings fasting glucose: uses a freestyle libre post prandial glucose:  freestyle Elenor Legato -Denies hypoglycemic/hyperglycemic symptoms -Current meal patterns:  breakfast: n/a  lunch: n/a  dinner: n/a snacks: n/a drinks: water and 2 sodas a week -Current exercise: not much right now -Educated on A1c and blood sugar goals; Exercise goal of 150 minutes per week; Counseled to check feet daily and get yearly eye exams -Counseled to check feet daily and get yearly eye exams -Counseled on diet and exercise extensively  COPD (Goal: control symptoms and prevent  exacerbations) -Controlled -Current treatment  Trelegy 100-62.5-25 mcg/inh inhale 1 puff daily at bedtime Albuterol as needed -Medications previously tried: unknown  -Gold Grade: Gold 1 (FEV1>80%) -Current COPD Classification:  A (low sx, <2 exacerbations/yr) -MMRC/CAT score: n/a -Pulmonary function testing: 05/04/21 -Exacerbations requiring treatment in last 6 months: none -Patient reports consistent use of maintenance inhaler -Frequency of rescue inhaler use: depends on weather (twice a week usually) -Counseled on Proper inhaler technique; Benefits of consistent maintenance inhaler use When to use rescue inhaler -Counseled on diet and exercise extensively Recommended to continue current medication Assessed patient finances. Patient likely won't qualify for Trelegy PAP due to living with daughter.  Allergic rhinitis (Goal: minimize symptoms) -Controlled -Current treatment  Montelukast 10 mg 1 tablet at bedtime Fluticasone 50 mcg/act as needed -Medications previously tried: none  -Recommended restarting montelukast as patient ran out and was unaware.  Tobacco use (Goal quit smoking) -Uncontrolled -Previous quit attempts: n/a -Current treatment  No medications -Patient smokes After 30 minutes of waking -Patient triggers include: anxiety -On a scale of 1-10, reports MOTIVATION to quit is 4 -On a scale of 1-10, reports CONFIDENCE in quitting is 5 -Provided contact information for [place] Quit Line (1-800-QUIT-NOW) and encouraged patient to reach out to this group for support. - Counseled on benefits of smoking cessation.  GERD (Goal: minimize symptoms of heartburn) -Controlled -Current treatment  Famotidine 20 mg 1 tablet twice daily Pantoprazole 40 mg 1 tablet daily -Medications previously tried: none  -Counseled on non-pharmacologic management of symptoms such as elevating the head of your bed, avoiding eating 2-3 hours before bed, avoiding triggering foods such as acidic,  spicy, or fatty foods, eating smaller meals, and wearing clothes that are loose around the waist Recommended reducing famotidine to daily due to kidney function.  Neuropathy (Goal: minimize symptoms) -Not ideally controlled -Current treatment  Gabapentin 300 mg 2 capsules twice daily -Medications previously tried: none  -Recommended moving second dose of gabapentin to bedtime as this can cause some daytime fatigue.  Overactive bladder (Goal: minimize symptoms) -Controlled -Current treatment  Myrbetriq 25 mg 1 tablet daily - not taking due to cost -Medications previously tried: tolterodine (side effects) -Recommended reaching out to urology for samples or an alternative. Assessed patient finances. Patient would not qualify for PAP as she has insurance coverage.  Health Maintenance -Vaccine gaps: shingrix, COVID vaccine (She is getting the booster) -Current therapy:  Miralax as needed every other day Acetaminophen 500 mg as needed -Educated on Cost vs benefit of each product must be carefully weighed by individual consumer -Patient is satisfied with current therapy and denies issues -Recommended to continue current medication  Patient Goals/Self-Care Activities Patient will:  - take medications as prescribed as evidenced by patient report and record review focus on medication adherence by using adherence packaging check blood pressure weekly, document, and provide at future appointments target a minimum of 150 minutes of moderate intensity exercise weekly  Follow Up Plan: Telephone follow up appointment with care management team member scheduled for: 4 months  Medication Assistance:  Patient does not qualify for patient assistance as she lives with daughter.  Compliance/Adherence/Medication fill history: Care Gaps: AWV - scheduled 08/27/2021 Last BP reading was 130/70 on 05/04/2021 Last HGA1C was 5.9 on 04/17/2021 Shingrix - never done Foot exam - overdue Covid 19  vaccine booster 2 - overdue  Star-Rating Drugs: Losartan 100 mg - last filled 02/10/2021 90DS at [facility]  Patient's preferred pharmacy is:  [facility] # [place], [address] [place] [address] Phone: [phone] Fax: [facility] [address], [place] [address] Phone: [phone] Fax: [phone]  [id number] [place], [place]: [phone]: [phone] [facility] [place] [place]: [phone]: [phone] Uses pill box? Yes - 7 days a week - morning noon and night Pt endorses 95% compliance - misses midday  We discussed: Benefits of medication synchronization, packaging and delivery as well as enhanced pharmacist oversight with [facility]. Patient decided to: Utilize [facility] for medication synchronization, packaging and delivery  Care Plan and Follow Up Patient Decision:  Patient agrees to Care Plan and Follow-up.  Plan: Telephone follow up appointment with care management team member scheduled for:  4 months  MJeni Salles PharmD, [phone]

## 2021-05-10 ENCOUNTER — Other Ambulatory Visit: Payer: Self-pay | Admitting: Family Medicine

## 2021-05-10 DIAGNOSIS — J3089 Other allergic rhinitis: Secondary | ICD-10-CM

## 2021-05-13 ENCOUNTER — Telehealth: Payer: Self-pay | Admitting: Diagnostic Neuroimaging

## 2021-05-13 ENCOUNTER — Telehealth: Payer: Self-pay | Admitting: Internal Medicine

## 2021-05-13 ENCOUNTER — Telehealth: Payer: Self-pay | Admitting: Pharmacist

## 2021-05-13 DIAGNOSIS — E785 Hyperlipidemia, unspecified: Secondary | ICD-10-CM

## 2021-05-13 MED ORDER — ROSUVASTATIN CALCIUM 20 MG PO TABS: 20.0000 mg | ORAL_TABLET | Freq: Every day | ORAL | 11 refills | Status: DC

## 2021-05-13 MED ORDER — NITROGLYCERIN 0.4 MG SL SUBL: 0.4000 mg | SUBLINGUAL_TABLET | SUBLINGUAL | 6 refills | Status: DC | PRN

## 2021-05-13 MED ORDER — GABAPENTIN 300 MG PO CAPS: 600.0000 mg | ORAL_CAPSULE | Freq: Two times a day (BID) | ORAL | 4 refills | Status: DC

## 2021-05-13 MED ORDER — EZETIMIBE 10 MG PO TABS: 10.0000 mg | ORAL_TABLET | Freq: Every day | ORAL | 0 refills | Status: DC

## 2021-05-13 MED ORDER — TRIAMTERENE-HCTZ 37.5-25 MG PO TABS: 0.5000 | ORAL_TABLET | Freq: Every day | ORAL | 0 refills | Status: DC

## 2021-05-13 NOTE — Telephone Encounter (Signed)
Rx was sent under Janett Billow, NP for refill. I called the pharmacy to update on this error and for rx to be c/a. I have [facility] Let[facility] Baptist [facility].

## 2021-05-13 NOTE — Addendum Note (Signed)
Addended by: Verlin Grills on: 05/13/2021 03:53 PM   Modules accepted: Orders

## 2021-05-13 NOTE — Telephone Encounter (Signed)
Pt scheduled for an appointment in November, refills sent to [facility].

## 2021-05-13 NOTE — Telephone Encounter (Signed)
[facility] Roselyn Reef) new prescription for gabapentin (NEURONTIN) 300 MG capsule at [facility] Phone: [phone]

## 2021-05-13 NOTE — Telephone Encounter (Signed)
*  STAT* If patient is at the pharmacy, call can be transferred to refill team.   1. Which medications need to be refilled? (please list name of each medication and dose if known)  ezetimibe (ZETIA) 10 MG tablet rosuvastatin (CRESTOR) 20 MG tablet triamterene-hydrochlorothiazide (MAXZIDE-25) 37.5-25 MG tablet nitroGLYCERIN (NITROSTAT) 0.4 MG SL tablet  2. Which pharmacy/location (including street and city if local pharmacy) is medication to be sent to?  [facility] - [place], [place] - [place] [address]  3. Do they need a 30 day or 90 day supply?  30 day supply  Standard supply of nitroglycerin. Patient is also completely out of nitroglycerin.

## 2021-05-13 NOTE — Progress Notes (Addendum)
Chronic Care Management Pharmacy Assistant   Name: [name]  MRN: [id number] DOB: 1946/07/02   05/13/2021   Referred by: [name], [name], MD  Reason for referral: Chronic care management  Reviewed chart for medication changes ahead of medication coordination call.  BP Readings from Last 3 Encounters:  05/04/21 130/70  04/17/21 128/80  02/16/21 110/64    Lab Results  Component Value Date   HGBA1C 5.9 04/17/2021     Verbal consent obtained for [facility] enhanced pharmacy services (medication synchronization, adherence packaging, delivery coordination). A medication sync plan was created to allow patient to get all medications delivered once every 30 to 90 days per patient preference. Patient understands they have freedom to choose pharmacy and clinical pharmacist will coordinate care between all prescribers and [facility].  Patient requested to obtain medications through Adherence Packaging  30 Days   Med Sync Plan: Rosuvastatin 20mg   Dorris Carnes MD     1  05/10/2021 30 DS June 09, 2021  Ezetimibe 10mg   Dorris Carnes MD  1     58 July 06, 2021  Losartan 100mg   [name] MD(PCP)  1     53 May 27, 2021  Metoprolol succinate 100mg  [name] MD(PCP)     1  29 June 06, 2021  Gabapentin 300mg   Andrey Spearman MD   2  2  368 August 08, 2021  Montelukast 10mg   [name] MD(PCP)     1  05/09/2021 30 DS June 08, 2021  Famotidine 20mg   [name] MD(PCP)  1   1  05/09/2021 30 DS June 08, 2021  [name] 37.5-25 mg Dorris Carnes MD  1     20 May 28, 2021  Nitroglycerin 0.4mg   Dorris Carnes MD PRN     0     Patient will need a short fill of Nitrostat, Crestor, Losartan, Metoprolol, Singulair, Pepcid and Maxzide prior to adherence delivery.   Prescriptions requested from PCP and specialists. Spoke with Deneise Lever with  Dr. Andrey Spearman, requested new prescription for Gabapentin. Spoke with [name] with Dr. Dorris Carnes, requested new  prescriptions for Rosuvastatin, Ezetimibe, [name] and Nitroglycerin.   Medications: Outpatient Encounter Medications as of 05/13/2021  Medication Sig   acetaminophen (TYLENOL) 500 MG tablet Take 1,000 mg by mouth as needed for moderate pain or headache.    albuterol (VENTOLIN HFA) 108 (90 Base) MCG/ACT inhaler INHALE ONE OR TWO PUFFS BY MOUTH INTO THE LUNGS EVERY SIX HOURS AS NEEDED FOR WHEEZING OR SHORTNESS OF BREATH   Alirocumab (PRALUENT) 75 MG/ML SOAJ Inject 75 mg into the skin every 14 (fourteen) days.   aspirin 81 MG chewable tablet Chew 81 mg by mouth daily.   Continuous Blood Gluc Receiver (FREESTYLE LIBRE 2 READER) DEVI Use to check blood sugar, dx:e11.9   Continuous Blood Gluc Sensor (FREESTYLE LIBRE 2 SENSOR) MISC Use to check blood sugar, dx:e11.9   ezetimibe (ZETIA) 10 MG tablet TAKE ONE TABLET BY MOUTH ONE TIME DAILY   famotidine (PEPCID) 20 MG tablet Take 1 tablet (20 mg total) by mouth 2 (two) times daily.   fluticasone (FLONASE) 50 MCG/ACT nasal spray PLACE 2 SPRAYS INTO BOTH NOSTRILS AT BEDTIME AS NEEDED FOR ALLERGIES.   Fluticasone-Umeclidin-Vilant (TRELEGY ELLIPTA) 100-62.5-25 MCG/INH AEPB Inhale 1 puff into the lungs daily.   gabapentin (NEURONTIN) 300 MG capsule Take 2 capsules (600 mg total) by mouth 2 (two) times daily.   losartan (COZAAR) 100 MG tablet TAKE ONE TABLET BY MOUTH ONE TIME  DAILY   metoprolol succinate (TOPROL-XL) 100 MG 24 hr tablet Take 1 tablet (100 mg total) by mouth daily. Take with or immediately following a meal.   montelukast (SINGULAIR) 10 MG tablet Take 1 tablet (10 mg total) by mouth at bedtime.   MYRBETRIQ 25 MG TB24 tablet Take 25 mg by mouth daily. (Patient not taking: Reported on 05/08/2021)   nitroGLYCERIN (NITROSTAT) 0.4 MG SL tablet Place 1 tablet (0.4 mg total) under the tongue every 5 (five) minutes as needed for chest pain. (Patient not taking: Reported on 05/08/2021)   pantoprazole (PROTONIX) 40 MG tablet Take 1 tablet (40 mg  total) by mouth daily.   polyethylene glycol powder (GLYCOLAX/MIRALAX) powder Take 17 g by mouth daily.   rosuvastatin (CRESTOR) 20 MG tablet Take 1 tablet (20 mg total) by mouth daily.   triamterene-hydrochlorothiazide (MAXZIDE-25) 37.5-25 MG tablet TAKE HALF TABLET BY MOUTH DAILY   No facility-administered encounter medications on file as of 05/13/2021.    Care Gaps: AWV - scheduled 08/27/2021 Last BP reading was 130/70 on 05/04/2021 Last HGA1C was 5.9 on 04/17/2021 Shingrix - never done Foot exam - overdue Covid 19 vaccine booster 2 - overdue  Star Rating Drugs: Losartan 100 mg - last filled 02/10/2021 90DS at [facility] Pharmacist Assistant [phone]

## 2021-05-19 ENCOUNTER — Emergency Department (HOSPITAL_BASED_OUTPATIENT_CLINIC_OR_DEPARTMENT_OTHER): Payer: Medicare HMO

## 2021-05-19 ENCOUNTER — Encounter (HOSPITAL_BASED_OUTPATIENT_CLINIC_OR_DEPARTMENT_OTHER): Payer: Self-pay | Admitting: Emergency Medicine

## 2021-05-19 ENCOUNTER — Emergency Department (HOSPITAL_BASED_OUTPATIENT_CLINIC_OR_DEPARTMENT_OTHER)
Admission: EM | Admit: 2021-05-19 | Discharge: 2021-05-19 | Disposition: A | Discharge status: Home / Self Care | Payer: Medicare HMO | Attending: Emergency Medicine | Admitting: Emergency Medicine

## 2021-05-19 ENCOUNTER — Other Ambulatory Visit: Payer: Self-pay

## 2021-05-19 DIAGNOSIS — I10 Essential (primary) hypertension: Secondary | ICD-10-CM | POA: Diagnosis not present

## 2021-05-19 DIAGNOSIS — Z96651 Presence of right artificial knee joint: Secondary | ICD-10-CM | POA: Insufficient documentation

## 2021-05-19 DIAGNOSIS — N183 Chronic kidney disease, stage 3 unspecified: Secondary | ICD-10-CM | POA: Diagnosis not present

## 2021-05-19 DIAGNOSIS — R109 Unspecified abdominal pain: Secondary | ICD-10-CM | POA: Diagnosis not present

## 2021-05-19 DIAGNOSIS — M545 Low back pain, unspecified: Secondary | ICD-10-CM | POA: Diagnosis not present

## 2021-05-19 DIAGNOSIS — J449 Chronic obstructive pulmonary disease, unspecified: Secondary | ICD-10-CM | POA: Diagnosis not present

## 2021-05-19 DIAGNOSIS — E1122 Type 2 diabetes mellitus with diabetic chronic kidney disease: Secondary | ICD-10-CM | POA: Insufficient documentation

## 2021-05-19 DIAGNOSIS — Z7951 Long term (current) use of inhaled steroids: Secondary | ICD-10-CM | POA: Insufficient documentation

## 2021-05-19 DIAGNOSIS — Z79899 Other long term (current) drug therapy: Secondary | ICD-10-CM | POA: Insufficient documentation

## 2021-05-19 DIAGNOSIS — R1084 Generalized abdominal pain: Secondary | ICD-10-CM | POA: Diagnosis not present

## 2021-05-19 DIAGNOSIS — M5459 Other low back pain: Secondary | ICD-10-CM | POA: Diagnosis not present

## 2021-05-19 DIAGNOSIS — I2510 Atherosclerotic heart disease of native coronary artery without angina pectoris: Secondary | ICD-10-CM | POA: Insufficient documentation

## 2021-05-19 DIAGNOSIS — Z7982 Long term (current) use of aspirin: Secondary | ICD-10-CM | POA: Diagnosis not present

## 2021-05-19 DIAGNOSIS — R69 Illness, unspecified: Secondary | ICD-10-CM | POA: Diagnosis not present

## 2021-05-19 DIAGNOSIS — Z85520 Personal history of malignant carcinoid tumor of kidney: Secondary | ICD-10-CM | POA: Diagnosis not present

## 2021-05-19 DIAGNOSIS — I129 Hypertensive chronic kidney disease with stage 1 through stage 4 chronic kidney disease, or unspecified chronic kidney disease: Secondary | ICD-10-CM | POA: Insufficient documentation

## 2021-05-19 DIAGNOSIS — F17210 Nicotine dependence, cigarettes, uncomplicated: Secondary | ICD-10-CM | POA: Insufficient documentation

## 2021-05-19 DIAGNOSIS — R10A0 Flank pain, unspecified side: Secondary | ICD-10-CM

## 2021-05-19 DIAGNOSIS — K5730 Diverticulosis of large intestine without perforation or abscess without bleeding: Secondary | ICD-10-CM | POA: Diagnosis not present

## 2021-05-19 DIAGNOSIS — K8020 Calculus of gallbladder without cholecystitis without obstruction: Secondary | ICD-10-CM | POA: Diagnosis not present

## 2021-05-19 LAB — URINALYSIS, ROUTINE W REFLEX MICROSCOPIC
Bilirubin Urine: NEGATIVE
Glucose, UA: NEGATIVE mg/dL
Hgb urine dipstick: NEGATIVE
Ketones, ur: NEGATIVE mg/dL
Leukocytes,Ua: NEGATIVE
Nitrite: NEGATIVE
Protein, ur: NEGATIVE mg/dL
Specific Gravity, Urine: 1.016 (ref 1.005–1.030)
pH: 6.5 (ref 5.0–8.0)

## 2021-05-19 LAB — COMPREHENSIVE METABOLIC PANEL
ALT: 9 U/L (ref 0–44)
AST: 15 U/L (ref 15–41)
Albumin: 4 g/dL (ref 3.5–5.0)
Alkaline Phosphatase: 60 U/L (ref 38–126)
Anion gap: 11 (ref 5–15)
BUN: 23 mg/dL (ref 8–23)
CO2: 26 mmol/L (ref 22–32)
Calcium: 10 mg/dL (ref 8.9–10.3)
Chloride: 102 mmol/L (ref 98–111)
Creatinine, Ser: 1.47 mg/dL — ABNORMAL HIGH (ref 0.44–1.00)
GFR, Estimated: 37 mL/min — ABNORMAL LOW (ref >60)
Glucose, Bld: 153 mg/dL — ABNORMAL HIGH (ref 70–99)
Potassium: 3.8 mmol/L (ref 3.5–5.1)
Sodium: 139 mmol/L (ref 135–145)
Total Bilirubin: 0.5 mg/dL (ref 0.3–1.2)
Total Protein: 8 g/dL (ref 6.5–8.1)

## 2021-05-19 LAB — CBC WITH DIFFERENTIAL/PLATELET
Abs Immature Granulocytes: 0.02 10*3/uL (ref 0.00–0.07)
Basophils Absolute: 0.1 10*3/uL (ref 0.0–0.1)
Basophils Relative: 1 %
Eosinophils Absolute: 0.4 10*3/uL (ref 0.0–0.5)
Eosinophils Relative: 5 %
HCT: 45.9 % (ref 36.0–46.0)
Hemoglobin: 15.1 g/dL — ABNORMAL HIGH (ref 12.0–15.0)
Immature Granulocytes: 0 %
Lymphocytes Relative: 39 %
Lymphs Abs: 2.8 10*3/uL (ref 0.7–4.0)
MCH: 30.2 pg (ref 26.0–34.0)
MCHC: 32.9 g/dL (ref 30.0–36.0)
MCV: 91.8 fL (ref 80.0–100.0)
Monocytes Absolute: 0.5 10*3/uL (ref 0.1–1.0)
Monocytes Relative: 7 %
Neutro Abs: 3.4 10*3/uL (ref 1.7–7.7)
Neutrophils Relative %: 48 %
Platelets: 242 10*3/uL (ref 150–400)
RBC: 5 MIL/uL (ref 3.87–5.11)
RDW: 15 % (ref 11.5–15.5)
WBC: 7.2 10*3/uL (ref 4.0–10.5)
nRBC: 0 % (ref 0.0–0.2)

## 2021-05-19 LAB — LIPASE, BLOOD: Lipase: 40 U/L (ref 11–51)

## 2021-05-19 MED ORDER — CYCLOBENZAPRINE HCL 10 MG PO TABS: 10.0000 mg | ORAL_TABLET | Freq: Two times a day (BID) | ORAL | 0 refills | Status: DC | PRN

## 2021-05-19 MED ORDER — ACETAMINOPHEN 325 MG PO TABS
650.0000 mg | ORAL_TABLET | Freq: Once | ORAL | Status: AC
  Administered 2021-05-19: 650 mg via ORAL
  Filled 2021-05-19: qty 2

## 2021-05-19 NOTE — ED Provider Notes (Signed)
[facility] EMERGENCY DEPT Provider Note   CSN: [id number] Arrival date & time: 05/19/21  1254     History Chief Complaint  Patient presents with   Flank Pain    [name] is a 75 y.o. female.  The history is provided by the patient.  Flank Pain This is a new problem. The current episode started more than 2 days ago. The problem occurs daily. The problem has not changed since onset.Associated symptoms include abdominal pain (left flank, left lower back). Pertinent negatives include no chest pain, no headaches and no shortness of breath. The symptoms are aggravated by bending. Nothing relieves the symptoms. She has tried acetaminophen for the symptoms. The treatment provided mild relief.      Past Medical History:  Diagnosis Date   Allergy    Anxiety    C. difficile diarrhea    history of   CAD (coronary artery disease), native coronary artery    a. 05/2017 showed 100% mLCx tx with DES, otherwise 20% mRCA, 50% ramus, 30% dLAD, 20% prox-mid LAD, 20% mLAD treated medically.    Cancer [facility])    Right kidney CA removed.    CKD (chronic kidney disease) stage 3, GFR 30-59 ml/min ([place]) 10/11/2016   s/p R nephrectomy   Colon polyps 2008   HYPERPLASTIC   Diabetes mellitus without complication ([place])    type 2  no meds   Gastritis    GERD (gastroesophageal reflux disease)    History of echocardiogram    Echo 3/18:  Moderate LVH, EF 14-43, grade 1 diastolic dysfunction, calcified aortic valve, mild MR, moderate LAE   History of nuclear stress test    Myoview 3/18: Mod size and intensity fixed septal defect, may be artifact.Opposite mod size and intensity lat defect, which is reversible and could represent ischemia or possibly artifact (SDS 4). LVEF 71% with normal wall motion. Intermediate risk study. >> images reviewed with Dr. Dorris Carnes - no sig ischemia; med rx    Hx of cardiovascular stress test    Lexiscan Myoview 6/16:  EF 70%, no scar or ischemia; Low Risk    Hyperlipidemia    Hypertension    Neuropathy    Orthostatic hypotension 05/28/2017   OSA (obstructive sleep apnea) 01/16/2021   Osteoarthritis    Rheumatoid arthritis ([place])    RA (Dr. Ouida Sills) Bilateral hands   S/P angioplasty with stent 05/27/17 to LCX with DES  05/28/2017   Thyroid disease     Patient Active Problem List   Diagnosis Date Noted   OSA (obstructive sleep apnea) 01/16/2021   COPD mixed type ([place]) 01/16/2021   Fatigue 11/03/2020   B12 deficiency 11/03/2020   Chest pain 08/30/2020   Malignant neoplasm of kidney excluding renal pelvis, unspecified laterality ([place]) 09/26/2019   Vitamin D deficiency, unspecified 09/26/2019   Anxiety disorder, unspecified 03/26/2019   Tobacco use disorder 09/29/2018   Cervical myelopathy ([place]) 05/29/2018   ANA positive 12/08/2017   Cervicalgia 09/16/2017   Carpal tunnel syndrome of left wrist 07/21/2017   Chondrocalcinosis 07/21/2017   Neuropathy 07/21/2017   Orthostatic hypotension 05/28/2017   S/P angioplasty with stent 05/27/17 to LCX with DES  05/28/2017   CAD (coronary artery disease)    Polyneuropathy 05/25/2017   Chest pain with moderate risk for cardiac etiology 05/25/2017   Class 1 obesity with serious comorbidity and body mass index (BMI) of 34.0 to 34.9 in adult 12/06/2016   CKD (chronic kidney disease) stage 3, GFR 30-59 ml/min (H[facility]) 10/11/2016  Hot flashes, menopausal 10/01/2016   Chest pain with moderate risk of acute coronary syndrome 10/25/2014   Degenerative spondylolisthesis 10/15/2014   Spinal stenosis, lumbar region, with neurogenic claudication 02/12/2013   DM (diabetes mellitus), type 2 with renal complications ([place]) 63/84/6659   History of Rtr nephrectomy 2010, secondary to renal cell cancer 01/14/2009   PERSONAL HX COLONIC POLYPS 11/14/2008   Hyperlipidemia associated with type 2 diabetes mellitus ([place]) 11/16/2007   ESOPHAGEAL STRICTURE 10/12/2007   Rheumatoid arthritis ([place]) 02/27/2007   Hypertension  with heart disease 01/19/2007   Allergic rhinitis 01/19/2007   GERD 01/19/2007    Past Surgical History:  Procedure Laterality Date   ABDOMINAL HYSTERECTOMY  1978   ANTERIOR CERVICAL DECOMP/DISCECTOMY FUSION N/A 05/29/2018   Procedure: ANTERIOR CERVICAL DECOMPRESSION FUSION - CERVICAL FIVE-CERVICAL SIX - CERVICAL SIX-CERVICAL SEVEN;  Surgeon: Earnie Larsson, MD;  Location: [facility];  Service: Neurosurgery;  Laterality: N/A;   BACK SURGERY     x 2   CARPAL TUNNEL RELEASE Left    COLONOSCOPY W/ BIOPSIES AND POLYPECTOMY     Hx: of   CORONARY STENT INTERVENTION N/A 05/27/2017   Procedure: CORONARY STENT INTERVENTION;  Surgeon: Burnell Blanks, MD;  Location: [facility] CV LAB;  Service: Cardiovascular;  Laterality: N/A;   ESOPHAGOGASTRODUODENOSCOPY     HNP     LEFT HEART CATH AND CORONARY ANGIOGRAPHY N/A 05/27/2017   Procedure: LEFT HEART CATH AND CORONARY ANGIOGRAPHY;  Surgeon: Burnell Blanks, MD;  Location: [facility] CV [facility];  Service: Cardiovascular;  Laterality: N/A;   LUMBAR LAMINECTOMY/DECOMPRESSION MICRODISCECTOMY Left 02/12/2013   Procedure: LUMBAR TWO THREE, LUMBAR THREE FOUR, LUMBAR FOUR FIVE  LAMINECTOMY/DECOMPRESSION MICRODISCECTOMY 3 LEVELS;  Surgeon: Charlie Pitter, MD;  Location: [facility];  Service: Neurosurgery;  Laterality: Left;   NEPHRECTOMY Right 2010   10.rcc cancer   TOTAL KNEE ARTHROPLASTY Right    Redo     OB History   No obstetric history on file.     Family History  Problem Relation Age of Onset   Rheum arthritis Mother    Stroke Mother    Prostate cancer Father    Heart disease Father    Diabetes Brother    Dementia Brother    Parkinsonism Brother    Kidney disease Son    Diabetes Brother    Cancer Brother    Dementia Brother    Colon cancer Neg Hx    Esophageal cancer Neg Hx    Pancreatic cancer Neg Hx    Liver disease Neg Hx    Rectal cancer Neg Hx    Stomach cancer Neg Hx     Social History   Tobacco Use   Smoking  status: Every Day    Packs/day: 0.20    Years: 15.00    Pack years: 3.00    Types: Cigarettes    Start date: 1970   Smokeless tobacco: Never   Tobacco comments:    pt has been an on and off smoker since age 30  Vaping Use   Vaping Use: Never used  Substance Use Topics   Alcohol use: No    Alcohol/week: 0.0 standard drinks   Drug use: No    Home Medications Prior to Admission medications   Medication Sig Start Date End Date Taking? Authorizing Provider  acetaminophen (TYLENOL) 500 MG tablet Take 1,000 mg by mouth as needed for moderate pain or headache.     [provider]  albuterol (VENTOLIN HFA) 108 (90 Base) MCG/ACT inhaler  INHALE ONE OR TWO PUFFS BY MOUTH INTO THE LUNGS EVERY SIX HOURS AS NEEDED FOR WHEEZING OR SHORTNESS OF BREATH 11/21/20   [name], [name], MD  Alirocumab (PRALUENT) 75 MG/ML SOAJ Inject 75 mg into the skin every 14 (fourteen) days. 06/10/20   Fay Records, MD  aspirin 81 MG chewable tablet Chew 81 mg by mouth daily.    [provider]  Continuous Blood Gluc Receiver (FREESTYLE LIBRE 2 READER) DEVI Use to check blood sugar, dx:e11.9 04/21/21   [name], [name], MD  Continuous Blood Gluc Sensor (FREESTYLE LIBRE 2 SENSOR) MISC Use to check blood sugar, dx:e11.9 04/21/21   [name], [name], MD  ezetimibe (ZETIA) 10 MG tablet Take 1 tablet (10 mg total) by mouth daily. 05/13/21   Fay Records, MD  famotidine (PEPCID) 20 MG tablet Take 1 tablet (20 mg total) by mouth 2 (two) times daily. 03/23/21   [name], [name], MD  fluticasone (FLONASE) 50 MCG/ACT nasal spray PLACE 2 SPRAYS INTO BOTH NOSTRILS AT BEDTIME AS NEEDED FOR ALLERGIES. 05/11/21   [name], [name], MD  Fluticasone-Umeclidin-Vilant (TRELEGY ELLIPTA) 100-62.5-25 MCG/INH AEPB Inhale 1 puff into the lungs daily. 05/04/21   Baird [name] D, MD  gabapentin (NEURONTIN) 300 MG capsule Take 2 capsules (600 mg total) by mouth 2 (two) times daily. 05/13/21 08/06/22  Penumalli, Earlean Polka, MD  losartan (COZAAR)  100 MG tablet TAKE ONE TABLET BY MOUTH ONE TIME DAILY 02/10/21   [name], [name], MD  metoprolol succinate (TOPROL-XL) 100 MG 24 hr tablet Take 1 tablet (100 mg total) by mouth daily. Take with or immediately following a meal. 11/03/20   [name], [name], MD  montelukast (SINGULAIR) 10 MG tablet Take 1 tablet (10 mg total) by mouth at bedtime. 11/26/20   [name], [name], MD  MYRBETRIQ 25 MG TB24 tablet Take 25 mg by mouth daily. Patient not taking: Reported on 05/08/2021 04/22/20   [provider]  nitroGLYCERIN (NITROSTAT) 0.4 MG SL tablet Place 1 tablet (0.4 mg total) under the tongue every 5 (five) minutes as needed for chest pain. 05/13/21   Fay Records, MD  pantoprazole (PROTONIX) 40 MG tablet Take 1 tablet (40 mg total) by mouth daily. 09/01/20 09/01/21  Darliss Cheney, MD  polyethylene glycol powder (GLYCOLAX/MIRALAX) powder Take 17 g by mouth daily. 05/03/17   Levin Erp, PA  rosuvastatin (CRESTOR) 20 MG tablet Take 1 tablet (20 mg total) by mouth daily. 05/13/21   Fay Records, MD  triamterene-hydrochlorothiazide (MAXZIDE-25) 37.5-25 MG tablet Take 0.5 tablets by mouth daily. 05/13/21   Fay Records, MD    Allergies    Oxycodone-acetaminophen, Percocet [oxycodone-acetaminophen], Pravastatin sodium, Hydrocodone, and Aspirin  Review of Systems   Review of Systems  Constitutional:  Negative for chills and fever.  HENT:  Negative for ear pain and sore throat.   Eyes:  Negative for pain and visual disturbance.  Respiratory:  Negative for cough and shortness of breath.   Cardiovascular:  Negative for chest pain and palpitations.  Gastrointestinal:  Positive for abdominal pain (left flank, left lower back). Negative for vomiting.  Genitourinary:  Positive for flank pain. Negative for dysuria and hematuria.  Musculoskeletal:  Positive for back pain. Negative for arthralgias.  Skin:  Negative for color change and rash.  Neurological:  Negative for seizures, syncope and  headaches.  All other systems reviewed and are negative.  Physical Exam Updated Vital Signs BP 124/77   Pulse 69   Temp 98  F (36.7 C)   Resp 16   Ht 5\' 6"  (4.403 m)   Wt 102.1 kg   SpO2 93%   BMI 36.32 kg/m   Physical Exam Vitals and nursing note reviewed.  Constitutional:      General: She is not in acute distress.    Appearance: She is well-developed. She is not ill-appearing.  HENT:     Head: Normocephalic and atraumatic.     Nose: Nose normal.     Mouth/Throat:     Mouth: Mucous membranes are moist.  Eyes:     Extraocular Movements: Extraocular movements intact.     Conjunctiva/sclera: Conjunctivae normal.     Pupils: Pupils are equal, round, and reactive to light.  Cardiovascular:     Rate and Rhythm: Normal rate and regular rhythm.     Pulses: Normal pulses.     Heart sounds: Normal heart sounds. No murmur heard. Pulmonary:     Effort: Pulmonary effort is normal. No respiratory distress.     Breath sounds: Normal breath sounds.  Abdominal:     Palpations: Abdomen is soft.     Tenderness: There is no abdominal tenderness. There is left CVA tenderness.  Musculoskeletal:        General: Tenderness (left lower back, no midline spinal pain) present.     Cervical back: Normal range of motion and neck supple.  Skin:    General: Skin is warm and dry.     Capillary Refill: Capillary refill takes less than 2 seconds.  Neurological:     General: No focal deficit present.     Mental Status: She is alert and oriented to person, place, and time.     Cranial Nerves: No cranial nerve deficit.     Sensory: No sensory deficit.     Motor: No weakness.     Coordination: Coordination normal.    ED Results / Procedures / Treatments   Labs (all labs ordered are listed, but only abnormal results are displayed) Labs Reviewed  CBC WITH DIFFERENTIAL/PLATELET - Abnormal; Notable for the following components:      Result Value   Hemoglobin 15.1 (*)    All other components  within normal limits  COMPREHENSIVE METABOLIC PANEL - Abnormal; Notable for the following components:   Glucose, Bld 153 (*)    Creatinine, Ser 1.47 (*)    GFR, Estimated 37 (*)    All other components within normal limits  LIPASE, BLOOD  URINALYSIS, ROUTINE W REFLEX MICROSCOPIC    EKG None  Radiology No results found.  Procedures Procedures   Medications Ordered in ED Medications  acetaminophen (TYLENOL) tablet 650 mg (has no administration in time range)    ED Course  I have reviewed the triage vital signs and the nursing notes.  Pertinent labs & imaging results that were available during my care of the patient were reviewed by me and considered in my medical decision making (see chart for details).    MDM Rules/Calculators/A&P                           SAGRARIO LINEBERRY is a 75 year old female with history of reflux, hypertension, renal cancer status post right nephrectomy who presents to the ED with left lower back/flank pain.  Normal vitals.  No fever.  No chest pain or shortness of breath.  Pain mostly to the left lower back/flank.  Could be kidney stones versus musculoskeletal process versus diverticulitis versus UTI.  No  real frontal abdominal pain however.  Neurovascular neuromuscularly intact throughout especially in her lower extremities.  Doubt dissection.  We will get basic labs including CT scan of abdomen and pelvis.  Will check urinalysis.  Would like Tylenol for pain and no narcotics at this time.  No significant anemia, electrolyte abnormality, kidney injury.  No significant leukocytosis.  Awaiting urinalysis CT scan abdomen and pelvis.  Suspect that these are normal this is likely muscular process and would benefit from muscle relaxants and lidocaine patches.  Handed off to oncoming ED staff.  Please see their note for further results, evaluation, disposition of the patient.  This chart was dictated using voice recognition software.  Despite best efforts to  proofread,  errors can occur which can change the documentation meaning.   Final Clinical Impression(s) / ED Diagnoses Final diagnoses:  Acute left-sided low back pain without sciatica  Flank pain    Rx / DC Orders ED Discharge Orders     None        Lennice Sites, DO 05/19/21 1439

## 2021-05-19 NOTE — ED Provider Notes (Signed)
  Physical Exam  BP 139/79   Pulse (!) 58   Temp 98 F (36.7 C)   Resp (!) 23   Ht 5\' 6"  (1.676 m)   Wt 102.1 kg   SpO2 96%   BMI 36.32 kg/m   Physical Exam  ED Course/Procedures     Procedures  MDM  Received care of pt from Dr. Ronnald Nian. Please see his note for prior hx, physical and care. Briefly, this is a 75yo female who presents with left flank pain.  CT pending.  CT without acute findings. UA without infection. Reproducible pain, doubt ACS given location, history, physical exam.  Suspect likley msk pain, given flexeril rx, recommend PCP follow up.       Gareth Morgan, MD 05/20/21 2214

## 2021-05-19 NOTE — ED Notes (Signed)
Patient transported to CT 

## 2021-05-19 NOTE — ED Triage Notes (Signed)
Pt arrives to ED with c/o of flank pain x3 days. She reports she has been feeling "hot" and she has not been able to sleep. Pt denies dysuria, hematuria. She only has one kidney which is her left.

## 2021-05-25 ENCOUNTER — Telehealth: Payer: Self-pay | Admitting: Internal Medicine

## 2021-05-25 ENCOUNTER — Telehealth: Payer: Self-pay

## 2021-05-25 ENCOUNTER — Telehealth: Payer: Self-pay | Admitting: Pharmacist

## 2021-05-25 DIAGNOSIS — J301 Allergic rhinitis due to pollen: Secondary | ICD-10-CM

## 2021-05-25 DIAGNOSIS — I119 Hypertensive heart disease without heart failure: Secondary | ICD-10-CM

## 2021-05-25 MED ORDER — FAMOTIDINE 20 MG PO TABS: 20.0000 mg | ORAL_TABLET | Freq: Two times a day (BID) | ORAL | 1 refills | Status: DC

## 2021-05-25 MED ORDER — LOSARTAN POTASSIUM 100 MG PO TABS: 100.0000 mg | ORAL_TABLET | Freq: Every day | ORAL | 2 refills | Status: DC

## 2021-05-25 MED ORDER — METOPROLOL SUCCINATE ER 100 MG PO TB24: 100.0000 mg | ORAL_TABLET | Freq: Every day | ORAL | 1 refills | Status: DC

## 2021-05-25 MED ORDER — MONTELUKAST SODIUM 10 MG PO TABS: 10.0000 mg | ORAL_TABLET | Freq: Every day | ORAL | 3 refills | Status: DC

## 2021-05-25 NOTE — Telephone Encounter (Signed)
Smoking cessation referral pending 

## 2021-05-25 NOTE — Telephone Encounter (Signed)
   [name] is a 75 y.o. female and has been referred to the pharmacist telephone-based smoking cessation service on 05/04/2021 by pulmonologist Dr. Annamaria Boots.    Confirmed that patient does not meet any of the following exclusion criteria: No  Pregnancy Schizophrenia, bipolar disorder, or major depression Myocardial infarction or coronary artery bypass grafting in the last 2 months Severe or worsening angina  Currently smoking > 10 cigarettes per day? No , smokes 5 cigarettes/day  Willing to quit smoking now or within 30 days? Yes  Interested in using medications to help you quit smoking? Yes   If no, patient does not meet criteria for study. Patient was referred back to pulmonologist and/or PCP, recommend [facility] at 1-800-QUITNOW for additional support.

## 2021-05-25 NOTE — Chronic Care Management (AMB) (Signed)
    Chronic Care Management Pharmacy Assistant   Name: [name]  MRN: [id number] DOB: Feb 21, 1946  05/13/2021 new prescriptions were requested with [name] at Dr. Dorris Carnes' office, requested new prescriptions for Rosuvastatin, Ezetimibe, [name] and Nitroglycerin to go to [facility].  They sent prescriptions were sent to [name], called cardiology and spoke with [facility] explaining what happened and requested Rosuvastatin, Ezetimibe, [name] and Nitroglycerin to be sent to [facility]. [name] stated she will look into this and have these medications sent to [facility] today.  Care Gaps: AWV - scheduled 08/27/2021 Last BP reading was 130/70 on 05/04/2021 Last HGA1C was 5.9 on 04/17/2021 Shingrix - never done Foot exam - overdue Covid 19 vaccine booster 2 - overdue  Star Rating Drugs: Losartan 100 mg - last filled 02/10/2021 90DS at [facility] Pharmacist Assistant [phone]

## 2021-05-25 NOTE — Telephone Encounter (Signed)
*  STAT* If patient is at the pharmacy, call can be transferred to refill team.   1. Which medications need to be refilled? (please list name of each medication and dose if known)  ezetimibe (ZETIA) 10 MG tablet rosuvastatin (CRESTOR) 20 MG tablet triamterene-hydrochlorothiazide (MAXZIDE-25) 37.5-25 MG tablet nitroGLYCERIN (NITROSTAT) 0.4 MG SL tablet  2. Which pharmacy/location (including street and city if local pharmacy) is medication to be sent to? [facility] - [place], [place] - [place] [address]  3. Do they need a 30 day or 90 day supply? [place] called, these medications were sent to the wrong pharmacy back on 10/12. Per 10/12 phone note, it was stated to send to [facility] but was instead it was sent to [place]. Please advise.

## 2021-05-25 NOTE — Telephone Encounter (Signed)
-----   Message from Viona Gilmore, [facility] sent at 05/25/2021  7:47 AM EDT ----- Regarding: Refills Hi,  Ms. Warburton is going to try out the adherence packaging with [facility]. Can you please send refills of the following to [facility]? -losartan -metoprolol -famotidine -montelukast  Thank you! Maddie

## 2021-05-26 ENCOUNTER — Other Ambulatory Visit: Payer: Self-pay | Admitting: *Deleted

## 2021-05-26 DIAGNOSIS — E785 Hyperlipidemia, unspecified: Secondary | ICD-10-CM

## 2021-05-26 MED ORDER — EZETIMIBE 10 MG PO TABS: 10.0000 mg | ORAL_TABLET | Freq: Every day | ORAL | 3 refills | Status: DC

## 2021-05-26 MED ORDER — NITROGLYCERIN 0.4 MG SL SUBL: 0.4000 mg | SUBLINGUAL_TABLET | SUBLINGUAL | 2 refills | Status: DC | PRN

## 2021-05-26 MED ORDER — TRIAMTERENE-HCTZ 37.5-25 MG PO TABS: 0.5000 | ORAL_TABLET | Freq: Every day | ORAL | 3 refills | Status: DC

## 2021-05-26 MED ORDER — ROSUVASTATIN CALCIUM 20 MG PO TABS: 20.0000 mg | ORAL_TABLET | Freq: Every day | ORAL | 3 refills | Status: DC

## 2021-06-01 DIAGNOSIS — E1169 Type 2 diabetes mellitus with other specified complication: Secondary | ICD-10-CM | POA: Diagnosis not present

## 2021-06-01 DIAGNOSIS — E785 Hyperlipidemia, unspecified: Secondary | ICD-10-CM

## 2021-06-01 DIAGNOSIS — I119 Hypertensive heart disease without heart failure: Secondary | ICD-10-CM | POA: Diagnosis not present

## 2021-06-05 ENCOUNTER — Other Ambulatory Visit: Payer: Self-pay | Admitting: Family Medicine

## 2021-06-05 DIAGNOSIS — J301 Allergic rhinitis due to pollen: Secondary | ICD-10-CM

## 2021-06-15 NOTE — Progress Notes (Signed)
Cardiology Office Note   Date:  06/16/2021   ID:  [name], [name] 19-Dec-1945, MRN [id number]  PCP:  [name], [name], MD  Cardiologist:   Dorris Carnes, MD   Patient presents for f/u of CAD and HTN      History of Present Illness: [name] is a 75 y.o. female with a history of CAD  She had a normal myovue in 2016 but in 2018 had lateral defect that was reversible but that I did not feel represented signficant ischemia   The patient presented with CP  CT showed + FFR and she underwent cath    This showed:  LAD 20%; Ramus 50%;  RCA 20% LCx 100%  The pt underent PTCA/DES to LCx   .  Patient also has a history of hypertension, hyperlipidemia, diabetes, chronic kidney disease (status post right nephrectomy), rheumatoid arthritis, OSA.  The pt was admitted to [facility] at the end of January 2022 with chest pain.  She ruled out for myocardial infarction.  Echocardiogram was done which was normal.  She underwent a Myoview stress test which was also showed no is evidence of ischemia.  The patient says she was under increased stress at the time she was helping her son move whether that contributed.  She also has a history of GI (strictures) she has not noticed a change in her food or ability to swallow.  I saw the pt in Feb 2022  Pt says she is doing OK   Being fitted for CPAP  Will start soon   Hopes energy will pick up  She is tired No CP    Not doing too much activity   Trying to get back to water aerobics This helps back     Breakfast:  Banana or muffin   Occasional egg and toast or sausague Lunch   Mostlly breakfast is lunch     Crackers if eats extra Dinner:  PB J sandwich     Noodles   Occasional cook Drinks:   Pepsi     Current Meds  Medication Sig   acetaminophen (TYLENOL) 500 MG tablet Take 1,000 mg by mouth as needed for moderate pain or headache.    albuterol (VENTOLIN HFA) 108 (90 Base) MCG/ACT inhaler INHALE ONE OR TWO PUFFS BY MOUTH INTO THE LUNGS EVERY SIX HOURS AS  NEEDED FOR WHEEZING OR SHORTNESS OF BREATH   Alirocumab (PRALUENT) 75 MG/ML SOAJ Inject 75 mg into the skin every 14 (fourteen) days.   aspirin 81 MG chewable tablet Chew 81 mg by mouth daily.   Continuous Blood Gluc Receiver (FREESTYLE LIBRE 2 READER) DEVI Use to check blood sugar, dx:e11.9   Continuous Blood Gluc Sensor (FREESTYLE LIBRE 2 SENSOR) MISC Use to check blood sugar, dx:e11.9   cyclobenzaprine (FLEXERIL) 10 MG tablet Take 1 tablet (10 mg total) by mouth 2 (two) times daily as needed for muscle spasms.   ezetimibe (ZETIA) 10 MG tablet Take 1 tablet (10 mg total) by mouth daily.   famotidine (PEPCID) 20 MG tablet Take 1 tablet (20 mg total) by mouth 2 (two) times daily.   fluticasone (FLONASE) 50 MCG/ACT nasal spray PLACE 2 SPRAYS INTO BOTH NOSTRILS AT BEDTIME AS NEEDED FOR ALLERGIES.   Fluticasone-Umeclidin-Vilant (TRELEGY ELLIPTA) 100-62.5-25 MCG/INH AEPB Inhale 1 puff into the lungs daily.   gabapentin (NEURONTIN) 300 MG capsule Take 2 capsules (600 mg total) by mouth 2 (two) times daily.   losartan (COZAAR) 100 MG tablet Take 1 tablet (100  mg total) by mouth daily.   metoprolol succinate (TOPROL-XL) 100 MG 24 hr tablet Take 1 tablet (100 mg total) by mouth daily. Take with or immediately following a meal.   montelukast (SINGULAIR) 10 MG tablet Take 1 tablet (10 mg total) by mouth at bedtime.   MYRBETRIQ 25 MG TB24 tablet Take 25 mg by mouth daily.   nitroGLYCERIN (NITROSTAT) 0.4 MG SL tablet Place 1 tablet (0.4 mg total) under the tongue every 5 (five) minutes as needed for chest pain.   pantoprazole (PROTONIX) 40 MG tablet Take 1 tablet (40 mg total) by mouth daily.   polyethylene glycol powder (GLYCOLAX/MIRALAX) powder Take 17 g by mouth daily.   rosuvastatin (CRESTOR) 20 MG tablet Take 1 tablet (20 mg total) by mouth daily.   triamterene-hydrochlorothiazide (MAXZIDE-25) 37.5-25 MG tablet Take 0.5 tablets by mouth daily.     Allergies:   Oxycodone-acetaminophen, Percocet  [oxycodone-acetaminophen], Pravastatin sodium, Hydrocodone, and Aspirin   Past Medical History:  Diagnosis Date   Allergy    Anxiety    C. difficile diarrhea    history of   CAD (coronary artery disease), native coronary artery    a. 05/2017 showed 100% mLCx tx with DES, otherwise 20% mRCA, 50% ramus, 30% dLAD, 20% prox-mid LAD, 20% mLAD treated medically.    Cancer [facility])    Right kidney CA removed.    CKD (chronic kidney disease) stage 3, GFR 30-59 ml/min ([place]) 10/11/2016   s/p R nephrectomy   Colon polyps 2008   HYPERPLASTIC   Diabetes mellitus without complication ([place])    type 2  no meds   Gastritis    GERD (gastroesophageal reflux disease)    History of echocardiogram    Echo 3/18:  Moderate LVH, EF 51-76, grade 1 diastolic dysfunction, calcified aortic valve, mild MR, moderate LAE   History of nuclear stress test    Myoview 3/18: Mod size and intensity fixed septal defect, may be artifact.Opposite mod size and intensity lat defect, which is reversible and could represent ischemia or possibly artifact (SDS 4). LVEF 71% with normal wall motion. Intermediate risk study. >> images reviewed with Dr. Dorris Carnes - no sig ischemia; med rx    Hx of cardiovascular stress test    Lexiscan Myoview 6/16:  EF 70%, no scar or ischemia; Low Risk   Hyperlipidemia    Hypertension    Neuropathy    Orthostatic hypotension 05/28/2017   OSA (obstructive sleep apnea) 01/16/2021   Osteoarthritis    Rheumatoid arthritis ([place])    RA (Dr. Ouida Sills) Bilateral hands   S/P angioplasty with stent 05/27/17 to LCX with DES  05/28/2017   Thyroid disease     Past Surgical History:  Procedure Laterality Date   ABDOMINAL HYSTERECTOMY  1978   ANTERIOR CERVICAL DECOMP/DISCECTOMY FUSION N/A 05/29/2018   Procedure: ANTERIOR CERVICAL DECOMPRESSION FUSION - CERVICAL FIVE-CERVICAL SIX - CERVICAL SIX-CERVICAL SEVEN;  Surgeon: Earnie Larsson, MD;  Location: [facility];  Service: Neurosurgery;  Laterality: N/A;   BACK  SURGERY     x 2   CARPAL TUNNEL RELEASE Left    COLONOSCOPY W/ BIOPSIES AND POLYPECTOMY     Hx: of   CORONARY STENT INTERVENTION N/A 05/27/2017   Procedure: CORONARY STENT INTERVENTION;  Surgeon: Burnell Blanks, MD;  Location: [facility];  Service: Cardiovascular;  Laterality: N/A;   ESOPHAGOGASTRODUODENOSCOPY     HNP     LEFT HEART CATH AND CORONARY ANGIOGRAPHY N/A 05/27/2017   Procedure: LEFT HEART CATH AND CORONARY  ANGIOGRAPHY;  Surgeon: Burnell Blanks, MD;  Location: [facility];  Service: Cardiovascular;  Laterality: N/A;   LUMBAR LAMINECTOMY/DECOMPRESSION MICRODISCECTOMY Left 02/12/2013   Procedure: LUMBAR TWO THREE, LUMBAR THREE FOUR, LUMBAR FOUR FIVE  LAMINECTOMY/DECOMPRESSION MICRODISCECTOMY 3 LEVELS;  Surgeon: Charlie Pitter, MD;  Location: [facility];  Service: Neurosurgery;  Laterality: Left;   NEPHRECTOMY Right 2010   10.rcc cancer   TOTAL KNEE ARTHROPLASTY Right    Redo     Social History:  The patient  reports that she has been smoking cigarettes. She started smoking about 52 years ago. She has a 3.00 pack-year smoking history. She has never used smokeless tobacco. She reports that she does not drink alcohol and does not use drugs.   Family History:  The patient's family history includes Cancer in her brother; Dementia in her brother and brother; Diabetes in her brother and brother; Heart disease in her father; Kidney disease in her son; Parkinsonism in her brother; Prostate cancer in her father; Rheum arthritis in her mother; Stroke in her mother.    ROS:  Please see the history of present illness. All other systems are reviewed and  Negative to the above problem except as noted.    PHYSICAL EXAM: VS:  BP 134/80   Pulse 64   Ht 5\' 6"  (1.676 m)   Wt 229 lb (103.9 kg)   SpO2 97%   BMI 36.96 kg/m   PQD:IYMEBRAX obese 75yo in  in no acute distress  HEENT: normal  Neck: no JVD Cardiac: RRR; gr I/VI systolic murmur No LE  edema   Respiratory:  clear to auscultation  GI: soft, nontender  No masses MS: no deformity Moving all extremities   Skin: warm and dry, no rash Neuro:  Strength and sensation are intact Psych: euthymic mood, full affect   EKG:  EKG is not  ordered today.   Lipid Panel    Component Value Date/Time   CHOL 143 09/11/2020 1227   TRIG 98 09/11/2020 1227   HDL 56 09/11/2020 1227   CHOLHDL 2.6 09/11/2020 1227   CHOLHDL 1.9 08/31/2020 0335   VLDL 20 08/31/2020 0335   LDLCALC 69 09/11/2020 1227   LDLCALC 98 03/26/2020 1041   LDLDIRECT 171.5 12/06/2011 0812      Wt Readings from Last 3 Encounters:  06/16/21 229 lb (103.9 kg)  05/19/21 225 lb (102.1 kg)  05/04/21 229 lb (103.9 kg)      ASSESSMENT AND PLAN:  1.  CAD. Last intervention back in 2018.  Last myoview in Jan 2022   No ischemia   Clinically she is not having any angina   Follow       2.  Hypertension.BP is OK   Keep on current regimen   2.  Hyperlipidemia.  Pt on Praluent, Zetia and Crstor   Continue      4.  Diet Discussed diet again   Cut back onsugar       Plan for follow-up  end of summer     Current medicines are reviewed at length with the patient today.  The patient does not have concerns regarding medicines.  Signed, Dorris Carnes, MD  06/16/2021 9:01 AM    [address], [place], [place]  [address] Phone: [phone]; Fax: [phone]

## 2021-06-16 ENCOUNTER — Encounter: Payer: Self-pay | Admitting: Internal Medicine

## 2021-06-16 ENCOUNTER — Ambulatory Visit: Payer: Medicare HMO

## 2021-06-16 ENCOUNTER — Ambulatory Visit: Payer: Medicare HMO | Admitting: Internal Medicine

## 2021-06-16 ENCOUNTER — Other Ambulatory Visit: Payer: Self-pay

## 2021-06-16 VITALS — BP 134/80 | HR 64 | Ht 66.0 in | Wt 229.0 lb

## 2021-06-16 DIAGNOSIS — I2510 Atherosclerotic heart disease of native coronary artery without angina pectoris: Secondary | ICD-10-CM

## 2021-06-16 NOTE — Patient Instructions (Signed)
Medication Instructions:  NO CHANGES *If you need a refill on your cardiac medications before your next appointment, please call your pharmacy*   Lab Work: NONE If you have labs (blood work) drawn today and your tests are completely normal, you will receive your results only by: [place] (if you have MyChart) OR A paper copy in the mail If you have any lab test that is abnormal or we need to change your treatment, we will call you to review the results.   Testing/Procedures: NONE   Follow-Up: At [facility], you and your health needs are our priority.  As part of our continuing mission to provide you with exceptional heart care, we have created designated Provider Care Teams.  These Care Teams include your primary Cardiologist (physician) and Advanced Practice Providers (APPs -  Physician Assistants and Nurse Practitioners) who all work together to provide you with the care you need, when you need it.  We recommend signing up for the patient portal called "MyChart".  Sign up information is provided on this After Visit Summary.  MyChart is used to connect with patients for Virtual Visits (Telemedicine).  Patients are able to view lab/test results, encounter notes, upcoming appointments, etc.  Non-urgent messages can be sent to your provider as well.   To learn more about what you can do with MyChart, go to [email or web address].    Your next appointment:   9 month(s)  The format for your next appointment:   In Person  Provider:   Dorris Carnes, MD     Other Instructions NONE

## 2021-06-17 ENCOUNTER — Ambulatory Visit: Payer: Medicare HMO | Attending: Internal Medicine

## 2021-06-17 ENCOUNTER — Other Ambulatory Visit (HOSPITAL_BASED_OUTPATIENT_CLINIC_OR_DEPARTMENT_OTHER): Payer: Self-pay

## 2021-06-17 DIAGNOSIS — Z23 Encounter for immunization: Secondary | ICD-10-CM

## 2021-06-17 MED ORDER — PFIZER COVID-19 VAC BIVALENT 30 MCG/0.3ML IM SUSP
INTRAMUSCULAR | 0 refills | Status: DC
  Filled 2021-06-17: qty 0.3, 1d supply, fill #0

## 2021-06-17 NOTE — Progress Notes (Signed)
   Covid-19 Vaccination Clinic  Name:  [name]    MRN: [id number] DOB: Oct 14, 1945  06/17/2021  [name] was observed post Covid-19 immunization for 15 minutes without incident. She was provided with Vaccine Information Sheet and instruction to access the V-Safe system.   [name] was instructed to call 911 with any severe reactions post vaccine: Difficulty breathing  Swelling of face and throat  A fast heartbeat  A bad rash all over body  Dizziness and weakness   Immunizations Administered     Name Date Dose VIS Date Route   Pfizer Covid-19 Vaccine Bivalent Booster 06/17/2021  3:12 PM 0.3 mL 04/01/2021 Intramuscular   Manufacturer: [place]   Lot: [id number]   [place]: [phone]

## 2021-07-02 DIAGNOSIS — G4733 Obstructive sleep apnea (adult) (pediatric): Secondary | ICD-10-CM | POA: Diagnosis not present

## 2021-07-21 ENCOUNTER — Telehealth: Payer: Self-pay | Admitting: Pharmacist

## 2021-07-21 NOTE — Chronic Care Management (AMB) (Signed)
Chronic Care Management Pharmacy Assistant   Name: [name]  MRN: [id number] DOB: Aug 30, 1945  Reason for Encounter: Disease State / Hypertension Assessment Call   Conditions to be addressed/monitored: HTN  Recent office visits:  None  Recent consult visits:  06/16/2021 Dorris Carnes MD (cardiology) - Coronary artery disease involving native coronary artery of native heart without angina pectoris. No medication changes. Follow up in 9 months.  Hospital visits:  Patient was seen at [facility] ED on 05/19/2021 (1 hour) due to acute left sided low back pain without sciatica and an additional issue. Patient was started on Cyclobenzaprine 10 mg twice daily as needed. No medications were changed or discontinued.   Medications: Outpatient Encounter Medications as of 07/21/2021  Medication Sig Note   acetaminophen (TYLENOL) 500 MG tablet Take 1,000 mg by mouth as needed for moderate pain or headache.  05/19/2021: prn   albuterol (VENTOLIN HFA) 108 (90 Base) MCG/ACT inhaler INHALE ONE OR TWO PUFFS BY MOUTH INTO THE LUNGS EVERY SIX HOURS AS NEEDED FOR WHEEZING OR SHORTNESS OF BREATH    Alirocumab (PRALUENT) 75 MG/ML SOAJ Inject 75 mg into the skin every 14 (fourteen) days.    aspirin 81 MG chewable tablet Chew 81 mg by mouth daily.    Continuous Blood Gluc Receiver (FREESTYLE LIBRE 2 READER) DEVI Use to check blood sugar, dx:e11.9    Continuous Blood Gluc Sensor (FREESTYLE LIBRE 2 SENSOR) MISC Use to check blood sugar, dx:e11.9    COVID-19 mRNA bivalent vaccine, Pfizer, (PFIZER COVID-19 VAC BIVALENT) injection Inject into the muscle.    cyclobenzaprine (FLEXERIL) 10 MG tablet Take 1 tablet (10 mg total) by mouth 2 (two) times daily as needed for muscle spasms.    ezetimibe (ZETIA) 10 MG tablet Take 1 tablet (10 mg total) by mouth daily.    famotidine (PEPCID) 20 MG tablet Take 1 tablet (20 mg total) by mouth 2 (two) times daily.    fluticasone (FLONASE) 50 MCG/ACT nasal spray  PLACE 2 SPRAYS INTO BOTH NOSTRILS AT BEDTIME AS NEEDED FOR ALLERGIES.    Fluticasone-Umeclidin-Vilant (TRELEGY ELLIPTA) 100-62.5-25 MCG/INH AEPB Inhale 1 puff into the lungs daily.    gabapentin (NEURONTIN) 300 MG capsule Take 2 capsules (600 mg total) by mouth 2 (two) times daily.    losartan (COZAAR) 100 MG tablet Take 1 tablet (100 mg total) by mouth daily.    metoprolol succinate (TOPROL-XL) 100 MG 24 hr tablet Take 1 tablet (100 mg total) by mouth daily. Take with or immediately following a meal.    montelukast (SINGULAIR) 10 MG tablet Take 1 tablet (10 mg total) by mouth at bedtime.    MYRBETRIQ 25 MG TB24 tablet Take 25 mg by mouth daily. 05/19/2021: Patient was taking but in the donut hole with insurance,too $ high   nitroGLYCERIN (NITROSTAT) 0.4 MG SL tablet Place 1 tablet (0.4 mg total) under the tongue every 5 (five) minutes as needed for chest pain.    pantoprazole (PROTONIX) 40 MG tablet Take 1 tablet (40 mg total) by mouth daily.    polyethylene glycol powder (GLYCOLAX/MIRALAX) powder Take 17 g by mouth daily.    rosuvastatin (CRESTOR) 20 MG tablet Take 1 tablet (20 mg total) by mouth daily.    triamterene-hydrochlorothiazide (MAXZIDE-25) 37.5-25 MG tablet Take 0.5 tablets by mouth daily.    No facility-administered encounter medications on file as of 07/21/2021.  Fill History: losartan 100 mg tablet 05/25/2021 74   EZETIMIBE 10MG  TAB 07/07/2021 90   FLUTICASONE PROP 50  MCG SPRAY 05/11/2021 90   metoprolol succinate ER 100 mg tablet,extended release 24 hr 06/03/2021 64   montelukast 10 mg tablet 06/03/2021 62   NITROGLYCERN 0.4MG  SUB 05/13/2021 25   rosuvastatin 20 mg tablet 06/03/2021 61   triamterene 37.5 mg-hydrochlorothiazide 25 mg tablet 05/26/2021 74   Reviewed chart prior to disease state call. Spoke with patient regarding BP  Recent Office Vitals: BP Readings from Last 3 Encounters:  06/16/21 134/80  05/19/21 139/79  05/04/21 130/70   Pulse Readings from  Last 3 Encounters:  06/16/21 64  05/19/21 (!) 58  05/04/21 71    Wt Readings from Last 3 Encounters:  06/16/21 229 lb (103.9 kg)  05/19/21 225 lb (102.1 kg)  05/04/21 229 lb (103.9 kg)     Kidney Function Lab Results  Component Value Date/Time   CREATININE 1.47 (H) 05/19/2021 01:04 PM   CREATININE 1.37 (H) 01/25/2021 01:35 PM   CREATININE 1.47 (H) 07/07/2020 11:04 AM   CREATININE 1.18 (H) 10/01/2016 04:22 PM   GFR 51.41 (L) 09/26/2019 01:02 PM   GFRNONAA 37 (L) 05/19/2021 01:04 PM   GFRNONAA 34 (L) 07/07/2020 11:04 AM   GFRAA 40 (L) 07/07/2020 11:04 AM    BMP Latest Ref Rng & Units 05/19/2021 01/25/2021 09/01/2020  Glucose 70 - 99 mg/dL 153(H) 92 95  BUN 8 - 23 mg/dL 23 19 17   Creatinine 0.44 - 1.00 mg/dL 1.47(H) 1.37(H) 1.34(H)  BUN/Creat Ratio 6 - 22 (calc) - - -  Sodium 135 - 145 mmol/L 139 137 139  Potassium 3.5 - 5.1 mmol/L 3.8 4.0 3.7  Chloride 98 - 111 mmol/L 102 106 105  CO2 22 - 32 mmol/L 26 24 23   Calcium 8.9 - 10.3 mg/dL 10.0 9.3 9.6    Current antihypertensive regimen:  Losartan 100 mg daily Metoprolol 100 mg daily Maxide 37.5/25 mg 0.5 tablets daily  How often are you checking your Blood Pressure? Patient is checking her blood pressures 3 times per week  Current home BP readings: Patient states her current readings have been between 120/76 and 120/80  What recent interventions/DTPs have been made by any provider to improve Blood Pressure control since last CPP Visit: No new interventions have been made.  Any recent hospitalizations or ED visits since last visit with CPP? Yes, Patient was seen on 05/19/2021 at [facility] ED.   What diet changes have been made to improve Blood Pressure Control?  Patient will have a banana, dainish or cereal for breakfast, she doesn't eat lunch and for dinner she will have a meat, vegetable and an occasional starch and salad.  What exercise is being done to improve your Blood Pressure Control?  Patient is  planning to go back to water aerobics in January.  Adherence Review: Is the patient currently on ACE/ARB medication? Yes Does the patient have >5 day gap between last estimated fill dates? No   Reviewed chart for medication changes ahead of medication coordination call.  No OVs, Consults, or hospital visits since last care coordination call/Pharmacist visit. (If appropriate, list visit date, provider name)  No medication changes indicated OR if recent visit, treatment plan here.  BP Readings from Last 3 Encounters:  06/16/21 134/80  05/19/21 139/79  05/04/21 130/70    Lab Results  Component Value Date   HGBA1C 5.9 04/17/2021     Patient obtains medications through Adherence Packaging  30 Days   Last adherence delivery included: onboarding process Metoprolol 10 mg Singulair 10 mg Famotidine 20 mg Rosuvastatin 20  mg Triamterene/HCTZ 37.5/25 mg Losartan 100 mg  Patient is due for next adherence delivery on: 08/05/2021.  Called patient and reviewed medications and coordinated delivery.  This delivery to include: Rosuvastatin 20 mg at bedtime Ezetimibe 10 mg at breakfast Losartan 100 mg at breakfast Metoprolol Suc 100 mg at bedtime Gabapentin 300 mg 2 pills twice daily Montelukast 10 mg at bedtime Famotidine 20 mg twice daily Triamterene HCTZ 37.5/25 mg 1/2 tablet daily  Patient will need a short fill: None  Coordinated acute fill: None  Patient declined the following medications: Patient did not decline any medications.  Confirmed delivery date of 08/05/2021, advised patient that pharmacy will contact them the morning of delivery.  Care Gaps: AWV - scheduled 08/27/2021 Last BP reading was 134/80 on 06/16/2021 Last HGA1C was 5.9 on 04/17/2021 Shingrix - never done Foot exam - overdue Covid 19 vaccine booster 2 - overdue   Star Rating Drugs: Losartan 100 mg - last filled 05/25/2021 74DS at [facility] Rosuvastatin 20 mg - last filled 06/03/2021 61 DS at  [place] [phone]

## 2021-07-25 ENCOUNTER — Other Ambulatory Visit: Payer: Self-pay | Admitting: Family Medicine

## 2021-07-25 DIAGNOSIS — J301 Allergic rhinitis due to pollen: Secondary | ICD-10-CM

## 2021-07-28 ENCOUNTER — Telehealth: Payer: Self-pay | Admitting: Internal Medicine

## 2021-07-29 ENCOUNTER — Other Ambulatory Visit: Payer: Self-pay | Admitting: *Deleted

## 2021-07-29 DIAGNOSIS — G4733 Obstructive sleep apnea (adult) (pediatric): Secondary | ICD-10-CM

## 2021-07-29 NOTE — Telephone Encounter (Signed)
Called and spoke with patient, advised of recommendations per Dr. Annamaria Boots, she verbalized understanding.  Order sent to [place].  Nothing further needed.

## 2021-07-29 NOTE — Telephone Encounter (Signed)
Called and spoke with patient, she states she has not had her CPAP machine a month yet and has not been able to wear it much because the pressure is too strong.  Advised I would print off a download from the past 30 days, have Dr. Annamaria Boots look at it and once we hear back from him we will call her with his recommendations.  She verbalized understanding.  Dr. Annamaria Boots, Please advise.

## 2021-07-29 NOTE — Telephone Encounter (Signed)
Order- please change CPAP pressure to 4-10  Please ask patient to let us know if it is still too strong.

## 2021-07-29 NOTE — Telephone Encounter (Signed)
No data available for download.  Patient settings are auto 5-15.

## 2021-08-01 ENCOUNTER — Other Ambulatory Visit: Payer: Self-pay

## 2021-08-01 ENCOUNTER — Encounter (HOSPITAL_BASED_OUTPATIENT_CLINIC_OR_DEPARTMENT_OTHER): Payer: Self-pay | Admitting: *Deleted

## 2021-08-01 ENCOUNTER — Emergency Department (HOSPITAL_BASED_OUTPATIENT_CLINIC_OR_DEPARTMENT_OTHER)
Admission: EM | Admit: 2021-08-01 | Discharge: 2021-08-01 | Disposition: A | Discharge status: Home / Self Care | Payer: Medicare HMO | Attending: Emergency Medicine | Admitting: Emergency Medicine

## 2021-08-01 DIAGNOSIS — Z20822 Contact with and (suspected) exposure to covid-19: Secondary | ICD-10-CM | POA: Insufficient documentation

## 2021-08-01 DIAGNOSIS — I2510 Atherosclerotic heart disease of native coronary artery without angina pectoris: Secondary | ICD-10-CM | POA: Insufficient documentation

## 2021-08-01 DIAGNOSIS — J069 Acute upper respiratory infection, unspecified: Secondary | ICD-10-CM | POA: Insufficient documentation

## 2021-08-01 DIAGNOSIS — Z85528 Personal history of other malignant neoplasm of kidney: Secondary | ICD-10-CM | POA: Insufficient documentation

## 2021-08-01 DIAGNOSIS — Z955 Presence of coronary angioplasty implant and graft: Secondary | ICD-10-CM | POA: Diagnosis not present

## 2021-08-01 DIAGNOSIS — Z7951 Long term (current) use of inhaled steroids: Secondary | ICD-10-CM | POA: Diagnosis not present

## 2021-08-01 DIAGNOSIS — Z7982 Long term (current) use of aspirin: Secondary | ICD-10-CM | POA: Insufficient documentation

## 2021-08-01 DIAGNOSIS — Z79899 Other long term (current) drug therapy: Secondary | ICD-10-CM | POA: Insufficient documentation

## 2021-08-01 DIAGNOSIS — N183 Chronic kidney disease, stage 3 unspecified: Secondary | ICD-10-CM | POA: Diagnosis not present

## 2021-08-01 DIAGNOSIS — J449 Chronic obstructive pulmonary disease, unspecified: Secondary | ICD-10-CM | POA: Insufficient documentation

## 2021-08-01 DIAGNOSIS — F17210 Nicotine dependence, cigarettes, uncomplicated: Secondary | ICD-10-CM | POA: Diagnosis not present

## 2021-08-01 DIAGNOSIS — I129 Hypertensive chronic kidney disease with stage 1 through stage 4 chronic kidney disease, or unspecified chronic kidney disease: Secondary | ICD-10-CM | POA: Diagnosis not present

## 2021-08-01 DIAGNOSIS — R0981 Nasal congestion: Secondary | ICD-10-CM | POA: Diagnosis present

## 2021-08-01 DIAGNOSIS — Z96659 Presence of unspecified artificial knee joint: Secondary | ICD-10-CM | POA: Diagnosis not present

## 2021-08-01 DIAGNOSIS — E1122 Type 2 diabetes mellitus with diabetic chronic kidney disease: Secondary | ICD-10-CM | POA: Diagnosis not present

## 2021-08-01 LAB — RESP PANEL BY RT-PCR (FLU A&B, COVID) ARPGX2
Influenza A by PCR: NEGATIVE
Influenza B by PCR: NEGATIVE
SARS Coronavirus 2 by RT PCR: NEGATIVE

## 2021-08-01 MED ORDER — AMOXICILLIN 500 MG PO CAPS: 1000.0000 mg | ORAL_CAPSULE | Freq: Two times a day (BID) | ORAL | 0 refills | Status: DC

## 2021-08-01 MED ORDER — BENZONATATE 100 MG PO CAPS: 100.0000 mg | ORAL_CAPSULE | Freq: Three times a day (TID) | ORAL | 0 refills | Status: DC

## 2021-08-01 MED ORDER — ONDANSETRON 4 MG PO TBDP: ORAL_TABLET | ORAL | 0 refills | Status: DC

## 2021-08-01 NOTE — Discharge Instructions (Addendum)
Take tylenol 2 pills 4 times a day.  Drink plenty of fluids.  Return for worsening shortness of breath, headache, confusion. Follow up with your family doctor.     

## 2021-08-01 NOTE — ED Triage Notes (Signed)
Patient stated that she does not feel good since Thursday and has nasal congestion.

## 2021-08-01 NOTE — ED Provider Notes (Signed)
[facility] EMERGENCY DEPT Provider Note   CSN: [id number] Arrival date & time: 08/01/21  1423     History Chief Complaint  Patient presents with   Nasal Congestion    [name] is a 75 y.o. female.  75 yo F with chief complaints of cough congestion nausea vomiting diarrhea fevers and chills and backaches.  Going on for a few days now.  Daughter had a similar illness.  She denies any difficulty breathing has been able to eat and drink.  The history is provided by the patient.  Illness Severity:  Moderate Onset quality:  Gradual Duration:  3 days Timing:  Constant Progression:  Worsening Chronicity:  New Associated symptoms: congestion, cough, diarrhea, fever, nausea and vomiting   Associated symptoms: no chest pain, no headaches, no myalgias, no rhinorrhea, no shortness of breath and no wheezing       Past Medical History:  Diagnosis Date   Allergy    Anxiety    C. difficile diarrhea    history of   CAD (coronary artery disease), native coronary artery    a. 05/2017 showed 100% mLCx tx with DES, otherwise 20% mRCA, 50% ramus, 30% dLAD, 20% prox-mid LAD, 20% mLAD treated medically.    Cancer [facility])    Right kidney CA removed.    CKD (chronic kidney disease) stage 3, GFR 30-59 ml/min ([place]) 10/11/2016   s/p R nephrectomy   Colon polyps 2008   HYPERPLASTIC   Diabetes mellitus without complication ([place])    type 2  no meds   Gastritis    GERD (gastroesophageal reflux disease)    History of echocardiogram    Echo 3/18:  Moderate LVH, EF 31-49, grade 1 diastolic dysfunction, calcified aortic valve, mild MR, moderate LAE   History of nuclear stress test    Myoview 3/18: Mod size and intensity fixed septal defect, may be artifact.Opposite mod size and intensity lat defect, which is reversible and could represent ischemia or possibly artifact (SDS 4). LVEF 71% with normal wall motion. Intermediate risk study. >> images reviewed with Dr. Dorris Carnes - no sig  ischemia; med rx    Hx of cardiovascular stress test    Lexiscan Myoview 6/16:  EF 70%, no scar or ischemia; Low Risk   Hyperlipidemia    Hypertension    Neuropathy    Orthostatic hypotension 05/28/2017   OSA (obstructive sleep apnea) 01/16/2021   Osteoarthritis    Rheumatoid arthritis ([place])    RA (Dr. Ouida Sills) Bilateral hands   S/P angioplasty with stent 05/27/17 to LCX with DES  05/28/2017   Thyroid disease     Patient Active Problem List   Diagnosis Date Noted   OSA (obstructive sleep apnea) 01/16/2021   COPD mixed type ([place]) 01/16/2021   Fatigue 11/03/2020   B12 deficiency 11/03/2020   Chest pain 08/30/2020   Malignant neoplasm of kidney excluding renal pelvis, unspecified laterality ([place]) 09/26/2019   Vitamin D deficiency, unspecified 09/26/2019   Anxiety disorder, unspecified 03/26/2019   Tobacco use disorder 09/29/2018   Cervical myelopathy ([place]) 05/29/2018   ANA positive 12/08/2017   Cervicalgia 09/16/2017   Carpal tunnel syndrome of left wrist 07/21/2017   Chondrocalcinosis 07/21/2017   Neuropathy 07/21/2017   Orthostatic hypotension 05/28/2017   S/P angioplasty with stent 05/27/17 to LCX with DES  05/28/2017   CAD (coronary artery disease)    Polyneuropathy 05/25/2017   Chest pain with moderate risk for cardiac etiology 05/25/2017   Class 1 obesity with serious comorbidity and  body mass index (BMI) of 34.0 to 34.9 in adult 12/06/2016   CKD (chronic kidney disease) stage 3, GFR 30-59 ml/min ([facility]) 10/11/2016   Hot flashes, menopausal 10/01/2016   Chest pain with moderate risk of acute coronary syndrome 10/25/2014   Degenerative spondylolisthesis 10/15/2014   Spinal stenosis, lumbar region, with neurogenic claudication 02/12/2013   DM (diabetes mellitus), type 2 with renal complications ([place]) 60/45/4098   History of Rtr nephrectomy 2010, secondary to renal cell cancer 01/14/2009   PERSONAL HX COLONIC POLYPS 11/14/2008   Hyperlipidemia associated with type 2  diabetes mellitus ([place]) 11/16/2007   ESOPHAGEAL STRICTURE 10/12/2007   Rheumatoid arthritis ([place]) 02/27/2007   Hypertension with heart disease 01/19/2007   Allergic rhinitis 01/19/2007   GERD 01/19/2007    Past Surgical History:  Procedure Laterality Date   ABDOMINAL HYSTERECTOMY  1978   ANTERIOR CERVICAL DECOMP/DISCECTOMY FUSION N/A 05/29/2018   Procedure: ANTERIOR CERVICAL DECOMPRESSION FUSION - CERVICAL FIVE-CERVICAL SIX - CERVICAL SIX-CERVICAL SEVEN;  Surgeon: Earnie Larsson, MD;  Location: [facility];  Service: Neurosurgery;  Laterality: N/A;   BACK SURGERY     x 2   CARPAL TUNNEL RELEASE Left    COLONOSCOPY W/ BIOPSIES AND POLYPECTOMY     Hx: of   CORONARY STENT INTERVENTION N/A 05/27/2017   Procedure: CORONARY STENT INTERVENTION;  Surgeon: Burnell Blanks, MD;  Location: [facility];  Service: Cardiovascular;  Laterality: N/A;   ESOPHAGOGASTRODUODENOSCOPY     HNP     LEFT HEART CATH AND CORONARY ANGIOGRAPHY N/A 05/27/2017   Procedure: LEFT HEART CATH AND CORONARY ANGIOGRAPHY;  Surgeon: Burnell Blanks, MD;  Location: [facility];  Service: Cardiovascular;  Laterality: N/A;   LUMBAR LAMINECTOMY/DECOMPRESSION MICRODISCECTOMY Left 02/12/2013   Procedure: LUMBAR TWO THREE, LUMBAR THREE FOUR, LUMBAR FOUR FIVE  LAMINECTOMY/DECOMPRESSION MICRODISCECTOMY 3 LEVELS;  Surgeon: Charlie Pitter, MD;  Location: [facility];  Service: Neurosurgery;  Laterality: Left;   NEPHRECTOMY Right 2010   10.rcc cancer   TOTAL KNEE ARTHROPLASTY Right    Redo     OB History   No obstetric history on file.     Family History  Problem Relation Age of Onset   Rheum arthritis Mother    Stroke Mother    Prostate cancer Father    Heart disease Father    Diabetes Brother    Dementia Brother    Parkinsonism Brother    Kidney disease Son    Diabetes Brother    Cancer Brother    Dementia Brother    Colon cancer Neg Hx    Esophageal cancer Neg Hx    Pancreatic cancer Neg Hx     Liver disease Neg Hx    Rectal cancer Neg Hx    Stomach cancer Neg Hx     Social History   Tobacco Use   Smoking status: Every Day    Packs/day: 0.20    Years: 15.00    Pack years: 3.00    Types: Cigarettes    Start date: 1970   Smokeless tobacco: Never   Tobacco comments:    pt has been an on and off smoker since age 51  Vaping Use   Vaping Use: Never used  Substance Use Topics   Alcohol use: No    Alcohol/week: 0.0 standard drinks   Drug use: No    Home Medications Prior to Admission medications   Medication Sig Start Date End Date Taking? Authorizing Provider  amoxicillin (AMOXIL) 500 MG capsule Take 2  capsules (1,000 mg total) by mouth 2 (two) times daily. 08/01/21  Yes Deno Etienne, DO  benzonatate (TESSALON) 100 MG capsule Take 1 capsule (100 mg total) by mouth every 8 (eight) hours. 08/01/21  Yes Deno Etienne, DO  ondansetron (ZOFRAN-ODT) 4 MG disintegrating tablet 4mg  ODT q4 hours prn nausea/vomit 08/01/21  Yes Deno Etienne, DO  acetaminophen (TYLENOL) 500 MG tablet Take 1,000 mg by mouth as needed for moderate pain or headache.     [provider]  albuterol (VENTOLIN HFA) 108 (90 Base) MCG/ACT inhaler INHALE ONE OR TWO PUFFS BY MOUTH INTO THE LUNGS EVERY SIX HOURS AS NEEDED FOR WHEEZING OR SHORTNESS OF BREATH 11/21/20   [name], [name], MD  Alirocumab (PRALUENT) 75 MG/ML SOAJ Inject 75 mg into the skin every 14 (fourteen) days. 06/10/20   Fay Records, MD  aspirin 81 MG chewable tablet Chew 81 mg by mouth daily.    [provider]  Continuous Blood Gluc Receiver (FREESTYLE LIBRE 2 READER) DEVI Use to check blood sugar, dx:e11.9 04/21/21   [name], [name], MD  Continuous Blood Gluc Sensor (FREESTYLE LIBRE 2 SENSOR) MISC Use to check blood sugar, dx:e11.9 04/21/21   [name], [name], MD  COVID-19 mRNA bivalent vaccine, Pfizer, (PFIZER COVID-19 VAC BIVALENT) injection Inject into the muscle. 06/17/21   Carlyle Basques, MD  cyclobenzaprine (FLEXERIL) 10 MG tablet  Take 1 tablet (10 mg total) by mouth 2 (two) times daily as needed for muscle spasms. 05/19/21   Gareth Morgan, MD  ezetimibe (ZETIA) 10 MG tablet Take 1 tablet (10 mg total) by mouth daily. 05/26/21   Fay Records, MD  famotidine (PEPCID) 20 MG tablet Take 1 tablet (20 mg total) by mouth 2 (two) times daily. 05/25/21   [name], [name], MD  fluticasone (FLONASE) 50 MCG/ACT nasal spray PLACE 2 SPRAYS INTO BOTH NOSTRILS AT BEDTIME AS NEEDED FOR ALLERGIES. 05/11/21   [name], [name], MD  Fluticasone-Umeclidin-Vilant (TRELEGY ELLIPTA) 100-62.5-25 MCG/INH AEPB Inhale 1 puff into the lungs daily. 05/04/21   Baird [name] D, MD  gabapentin (NEURONTIN) 300 MG capsule Take 2 capsules (600 mg total) by mouth 2 (two) times daily. 05/13/21 08/06/22  Penumalli, Earlean Polka, MD  losartan (COZAAR) 100 MG tablet Take 1 tablet (100 mg total) by mouth daily. 05/25/21   [name], [name], MD  metoprolol succinate (TOPROL-XL) 100 MG 24 hr tablet Take 1 tablet (100 mg total) by mouth daily. Take with or immediately following a meal. 05/25/21   [name], [name], MD  montelukast (SINGULAIR) 10 MG tablet TAKE ONE TABLET BY MOUTH EVERYDAY AT BEDTIME 07/28/21   [name], [name], MD  MYRBETRIQ 25 MG TB24 tablet Take 25 mg by mouth daily. 04/22/20   [provider]  nitroGLYCERIN (NITROSTAT) 0.4 MG SL tablet Place 1 tablet (0.4 mg total) under the tongue every 5 (five) minutes as needed for chest pain. 05/26/21   Fay Records, MD  pantoprazole (PROTONIX) 40 MG tablet Take 1 tablet (40 mg total) by mouth daily. 09/01/20 09/01/21  Darliss Cheney, MD  polyethylene glycol powder (GLYCOLAX/MIRALAX) powder Take 17 g by mouth daily. 05/03/17   Levin Erp, PA  rosuvastatin (CRESTOR) 20 MG tablet Take 1 tablet (20 mg total) by mouth daily. 05/26/21 05/26/22  Fay Records, MD  triamterene-hydrochlorothiazide (MAXZIDE-25) 37.5-25 MG tablet Take 0.5 tablets by mouth daily. 05/26/21   Fay Records, MD    Allergies     Oxycodone-acetaminophen, Percocet [oxycodone-acetaminophen], Pravastatin sodium, Hydrocodone, and Aspirin  Review of Systems   Review of Systems  Constitutional:  Positive for chills and fever.  HENT:  Positive for congestion. Negative for rhinorrhea.   Eyes:  Negative for redness and visual disturbance.  Respiratory:  Positive for cough. Negative for shortness of breath and wheezing.   Cardiovascular:  Negative for chest pain and palpitations.  Gastrointestinal:  Positive for diarrhea, nausea and vomiting.  Genitourinary:  Negative for dysuria and urgency.  Musculoskeletal:  Negative for arthralgias and myalgias.  Skin:  Negative for pallor and wound.  Neurological:  Negative for dizziness and headaches.   Physical Exam Updated Vital Signs BP 108/73 (BP Location: Right Arm)    Pulse (!) 50    Temp 97.6 F (36.4 C) (Oral)    Resp 18    Ht 5\' 6"  (1.676 m)    Wt 90.7 kg    SpO2 95%    BMI 32.28 kg/m   Physical Exam Vitals and nursing note reviewed.  Constitutional:      General: She is not in acute distress.    Appearance: She is well-developed. She is not diaphoretic.  HENT:     Head: Normocephalic and atraumatic.     Comments: TMs bilaterally with purulent effusion and some distortion of landmarks.  No sinus tenderness percussion.  Swollen turbinates posterior nasal drip. Eyes:     Pupils: Pupils are equal, round, and reactive to light.  Cardiovascular:     Rate and Rhythm: Normal rate and regular rhythm.     Heart sounds: No murmur heard.   No friction rub. No gallop.  Pulmonary:     Effort: Pulmonary effort is normal.     Breath sounds: No wheezing or rales.  Abdominal:     General: There is no distension.     Palpations: Abdomen is soft.     Tenderness: There is no abdominal tenderness.  Musculoskeletal:        General: No tenderness.     Cervical back: Normal range of motion and neck supple.  Skin:    General: Skin is warm and dry.  Neurological:     Mental  Status: She is alert and oriented to person, place, and time.  Psychiatric:        Behavior: Behavior normal.    ED Results / Procedures / Treatments   Labs (all labs ordered are listed, but only abnormal results are displayed) Labs Reviewed  RESP PANEL BY RT-PCR (FLU A&B, COVID) [id number]    EKG None  Radiology No results found.  Procedures Procedures   Medications Ordered in ED Medications - No data to display  ED Course  I have reviewed the triage vital signs and the nursing notes.  Pertinent labs & imaging results that were available during my care of the patient were reviewed by me and considered in my medical decision making (see chart for details).    MDM Rules/Calculators/A&P                         75 yo F with what sounds like an acute viral syndrome going on for about 3 to 4 days.  She is well-appearing and nontoxic.  No hypoxia no difficulty breathing.  Clinically she appears to have an otitis bilaterally though she has no ear pain.  Not sure if this is a chronic finding for her or not.  I did discuss antibiotics but more likely she has a viral syndrome.  She would like a prescription for  antibiotics in case this gets worse or she has ear pain she will start taking the medication.  Will prescribe cough and nausea meds.  PCP follow-up.  6:41 PM:  I have discussed the diagnosis/risks/treatment options with the patient and family and believe the pt to be eligible for discharge home to follow-up with PCP. We also discussed returning to the ED immediately if new or worsening sx occur. We discussed the sx which are most concerning (e.g., sudden worsening pain, fever, inability to tolerate by mouth) that necessitate immediate return. Medications administered to the patient during their visit and any new prescriptions provided to the patient are listed below.  Medications given during this visit Medications - No data to display   The patient appears reasonably screen and/or  stabilized for discharge and I doubt any other medical condition or other [facility] requiring further screening, evaluation, or treatment in the ED at this time prior to discharge.      Final Clinical Impression(s) / ED Diagnoses Final diagnoses:  Upper respiratory tract infection, unspecified type    Rx / DC Orders ED Discharge Orders          Ordered    ondansetron (ZOFRAN-ODT) 4 MG disintegrating tablet        08/01/21 1826    benzonatate (TESSALON) 100 MG capsule  Every 8 hours        08/01/21 1826    amoxicillin (AMOXIL) 500 MG capsule  2 times daily        08/01/21 1826             Deno Etienne, DO 08/01/21 1841

## 2021-08-02 DIAGNOSIS — G4733 Obstructive sleep apnea (adult) (pediatric): Secondary | ICD-10-CM | POA: Diagnosis not present

## 2021-08-02 DIAGNOSIS — G4730 Sleep apnea, unspecified: Secondary | ICD-10-CM

## 2021-08-02 HISTORY — DX: Sleep apnea, unspecified: G47.30

## 2021-08-19 ENCOUNTER — Other Ambulatory Visit: Payer: Self-pay

## 2021-08-19 ENCOUNTER — Encounter: Payer: Self-pay | Admitting: Internal Medicine

## 2021-08-19 ENCOUNTER — Ambulatory Visit (INDEPENDENT_AMBULATORY_CARE_PROVIDER_SITE_OTHER): Payer: Medicare HMO | Admitting: Family Medicine

## 2021-08-19 ENCOUNTER — Ambulatory Visit (INDEPENDENT_AMBULATORY_CARE_PROVIDER_SITE_OTHER): Payer: Medicare HMO

## 2021-08-19 ENCOUNTER — Encounter: Payer: Self-pay | Admitting: Family Medicine

## 2021-08-19 VITALS — BP 134/80 | HR 79 | Resp 16 | Ht 66.0 in | Wt 223.5 lb

## 2021-08-19 DIAGNOSIS — M4322 Fusion of spine, cervical region: Secondary | ICD-10-CM | POA: Diagnosis not present

## 2021-08-19 DIAGNOSIS — M542 Cervicalgia: Secondary | ICD-10-CM

## 2021-08-19 DIAGNOSIS — J301 Allergic rhinitis due to pollen: Secondary | ICD-10-CM | POA: Diagnosis not present

## 2021-08-19 DIAGNOSIS — N1831 Chronic kidney disease, stage 3a: Secondary | ICD-10-CM

## 2021-08-19 DIAGNOSIS — I119 Hypertensive heart disease without heart failure: Secondary | ICD-10-CM

## 2021-08-19 LAB — BASIC METABOLIC PANEL
BUN: 22 mg/dL (ref 6–23)
CO2: 27 mEq/L (ref 19–32)
Calcium: 9.8 mg/dL (ref 8.4–10.5)
Chloride: 104 mEq/L (ref 96–112)
Creatinine, Ser: 1.62 mg/dL — ABNORMAL HIGH (ref 0.40–1.20)
GFR: 30.44 mL/min — ABNORMAL LOW (ref >60.00)
Glucose, Bld: 101 mg/dL — ABNORMAL HIGH (ref 70–99)
Potassium: 3.7 mEq/L (ref 3.5–5.1)
Sodium: 139 mEq/L (ref 135–145)

## 2021-08-19 NOTE — Assessment & Plan Note (Signed)
CT scan has showed mild areas of cylindrical bronchiectasis, emphysema.  She is fortunate PFTs are quite well-preserved. Plan-keep albuterol rescue inhaler available.  Encourage smoking cessation and walking for exercise.

## 2021-08-19 NOTE — Patient Instructions (Signed)
A few things to remember from today's visit:   Hypertension with heart disease - Plan: Basic metabolic panel  Stage 3a chronic kidney disease ([place]) - Plan: Basic metabolic panel  Neck pain on right side - Plan: DG Cervical Spine Complete, Ambulatory referral to Physical Therapy  If you need refills please call your pharmacy. Do not use My Chart to request refills or for acute issues that need immediate attention.   Neck pain could be a muscle strain and aggravated by arthritis. PT will be arranged. Topical icy hot or asper cream may help.  Please be sure medication list is accurate. If a new problem present, please set up appointment sooner than planned today.

## 2021-08-19 NOTE — Assessment & Plan Note (Signed)
Repeat sleep study confirmed severe OSA.  Medical issues and treatment reviewed. Plan-awaiting CPAP

## 2021-08-19 NOTE — Progress Notes (Signed)
Chief Complaint  Patient presents with   Follow-up    Seen at [facility]. Now having right side pain, stiffness in the morning and neck pain.   HPI: [name] is a 76 y.o. female with hx of OSA,fatigue,allergies,CAD,DDD, and CKD here today to follow on recent ED visit. She was seen in the ED on 08/01/21 for UR symptoms.  Postnasal drainage, nasal congestion, rhinorrhea,and sonus pressure. Amoxicillin was prescribed, which she stopped when she felt better and resume a few days ago, still taking it. Negative for fever,chills,changes in appetite. + Smoker.  She is having neck pain, worse in the morning, better as the day goes. Problem has been going on for 2 weeks, right side. Radiated to right trapezium. Sharp pain, 8/10, intermittent. She is taking Tylenol. No hx of trauma. Limitation of movement due to pain. Stable.  Hx of peripheral neuropathy affecting both UE's, L>R. She is on Gabapentin.  HTN and CKD III: Currently on [name] 37.5-25 mg daily, Metoprolol succinate 100 mg daily,and Losartan 100 mg daily. Negative for severe/frequent headache, visual changes, chest pain, dyspnea, palpitation,focal weakness, or edema. She has not noted gross hematuria,foam in urine,or decreased urine output.  Lab Results  Component Value Date   CREATININE 1.47 (H) 05/19/2021   BUN 23 05/19/2021   NA 139 05/19/2021   K 3.8 05/19/2021   CL 102 05/19/2021   CO2 26 05/19/2021   Review of Systems  Constitutional:  Positive for fatigue. Negative for activity change.  HENT:  Negative for mouth sores, nosebleeds and sore throat.   Respiratory:  Negative for cough and wheezing.   Gastrointestinal:  Negative for abdominal pain, nausea and vomiting.       Negative for changes in bowel habits.  Skin:  Negative for pallor and rash.  Allergic/Immunologic: Positive for environmental allergies.  Neurological:  Negative for syncope and facial asymmetry.  Psychiatric/Behavioral:   Negative for confusion. The patient is nervous/anxious.   Rest see pertinent positives and negatives per HPI.  Current Outpatient Medications on File Prior to Visit  Medication Sig Dispense Refill   acetaminophen (TYLENOL) 500 MG tablet Take 1,000 mg by mouth as needed for moderate pain or headache.      albuterol (VENTOLIN HFA) 108 (90 Base) MCG/ACT inhaler INHALE ONE OR TWO PUFFS BY MOUTH INTO THE LUNGS EVERY SIX HOURS AS NEEDED FOR WHEEZING OR SHORTNESS OF BREATH 8.5 g 0   Alirocumab (PRALUENT) 75 MG/ML SOAJ Inject 75 mg into the skin every 14 (fourteen) days. 2 mL 11   amoxicillin (AMOXIL) 500 MG capsule Take 2 capsules (1,000 mg total) by mouth 2 (two) times daily. 40 capsule 0   aspirin 81 MG chewable tablet Chew 81 mg by mouth daily.     Continuous Blood Gluc Receiver (FREESTYLE LIBRE 2 READER) DEVI Use to check blood sugar, dx:e11.9 1 each 2   Continuous Blood Gluc Sensor (FREESTYLE LIBRE 2 SENSOR) MISC Use to check blood sugar, dx:e11.9 3 each 3   cyclobenzaprine (FLEXERIL) 10 MG tablet Take 1 tablet (10 mg total) by mouth 2 (two) times daily as needed for muscle spasms. 20 tablet 0   ezetimibe (ZETIA) 10 MG tablet Take 1 tablet (10 mg total) by mouth daily. 90 tablet 3   famotidine (PEPCID) 20 MG tablet Take 1 tablet (20 mg total) by mouth 2 (two) times daily. 180 tablet 1   fluticasone (FLONASE) 50 MCG/ACT nasal spray PLACE 2 SPRAYS INTO BOTH NOSTRILS AT BEDTIME AS NEEDED FOR ALLERGIES. 48 mL  1   Fluticasone-Umeclidin-Vilant (TRELEGY ELLIPTA) 100-62.5-25 MCG/INH AEPB Inhale 1 puff into the lungs daily. 60 each 5   gabapentin (NEURONTIN) 300 MG capsule Take 2 capsules (600 mg total) by mouth 2 (two) times daily. 360 capsule 4   losartan (COZAAR) 100 MG tablet Take 1 tablet (100 mg total) by mouth daily. 90 tablet 2   metoprolol succinate (TOPROL-XL) 100 MG 24 hr tablet Take 1 tablet (100 mg total) by mouth daily. Take with or immediately following a meal. 90 tablet 1   montelukast  (SINGULAIR) 10 MG tablet TAKE ONE TABLET BY MOUTH EVERYDAY AT BEDTIME 30 tablet 3   MYRBETRIQ 25 MG TB24 tablet Take 25 mg by mouth daily.     nitroGLYCERIN (NITROSTAT) 0.4 MG SL tablet Place 1 tablet (0.4 mg total) under the tongue every 5 (five) minutes as needed for chest pain. 25 tablet 2   ondansetron (ZOFRAN-ODT) 4 MG disintegrating tablet 4mg  ODT q4 hours prn nausea/vomit 20 tablet 0   pantoprazole (PROTONIX) 40 MG tablet Take 1 tablet (40 mg total) by mouth daily. 30 tablet 1   polyethylene glycol powder (GLYCOLAX/MIRALAX) powder Take 17 g by mouth daily. 500 g 3   rosuvastatin (CRESTOR) 20 MG tablet Take 1 tablet (20 mg total) by mouth daily. 90 tablet 3   triamterene-hydrochlorothiazide (MAXZIDE-25) 37.5-25 MG tablet Take 0.5 tablets by mouth daily. 45 tablet 3   No current facility-administered medications on file prior to visit.   Past Medical History:  Diagnosis Date   Allergy    Anxiety    C. difficile diarrhea    history of   CAD (coronary artery disease), native coronary artery    a. 05/2017 showed 100% mLCx tx with DES, otherwise 20% mRCA, 50% ramus, 30% dLAD, 20% prox-mid LAD, 20% mLAD treated medically.    Cancer [facility])    Right kidney CA removed.    CKD (chronic kidney disease) stage 3, GFR 30-59 ml/min ([place]) 10/11/2016   s/p R nephrectomy   Colon polyps 2008   HYPERPLASTIC   Diabetes mellitus without complication ([place])    type 2  no meds   Gastritis    GERD (gastroesophageal reflux disease)    History of echocardiogram    Echo 3/18:  Moderate LVH, EF 29-79, grade 1 diastolic dysfunction, calcified aortic valve, mild MR, moderate LAE   History of nuclear stress test    Myoview 3/18: Mod size and intensity fixed septal defect, may be artifact.Opposite mod size and intensity lat defect, which is reversible and could represent ischemia or possibly artifact (SDS 4). LVEF 71% with normal wall motion. Intermediate risk study. >> images reviewed with Dr. Dorris Carnes - no  sig ischemia; med rx    Hx of cardiovascular stress test    Lexiscan Myoview 6/16:  EF 70%, no scar or ischemia; Low Risk   Hyperlipidemia    Hypertension    Neuropathy    Orthostatic hypotension 05/28/2017   OSA (obstructive sleep apnea) 01/16/2021   Osteoarthritis    Rheumatoid arthritis ([place])    RA (Dr. Ouida Sills) Bilateral hands   S/P angioplasty with stent 05/27/17 to LCX with DES  05/28/2017   Thyroid disease    Allergies  Allergen Reactions   Oxycodone-Acetaminophen Hives and Itching    hallucinations   Percocet [Oxycodone-Acetaminophen] Hives, Itching and Other (See Comments)    hallucinations   Pravastatin Sodium Other (See Comments)    Muscle aches Muscle aches   Hydrocodone Other (See Comments)    "crazy  dreams"   Aspirin Other (See Comments)    GI upset Other reaction(s): Other (See Comments) REACTION: GI upset   Social History   Socioeconomic History   Marital status: Divorced    Spouse name: Not on file   Number of children: 3   Years of education: 12   Highest education level: Not on file  Occupational History   Occupation: Retired    Fish farm manager: RETIRED  Tobacco Use   Smoking status: Every Day    Packs/day: 0.20    Years: 15.00    Pack years: 3.00    Types: Cigarettes    Start date: 1970   Smokeless tobacco: Never   Tobacco comments:    pt has been an on and off smoker since age 89  Vaping Use   Vaping Use: Never used  Substance and Sexual Activity   Alcohol use: No    Alcohol/week: 0.0 standard drinks   Drug use: No   Sexual activity: Not on file  Other Topics Concern   Not on file  Social History Narrative   Single, dgtr lives with her   Current on/off smoker   Alcohol use- no   Drug use-no   Regular Exercise-yes   Social Determinants of Health   Financial Resource Strain: Low Risk    Difficulty of Paying Living Expenses: Not very hard  Food Insecurity: No Food Insecurity   Worried About Charity fundraiser in the Last Year: Never  true   [place] in the Last Year: Never true  Transportation Needs: No Transportation Needs   Lack of Transportation (Medical): No   Lack of Transportation (Non-Medical): No  Physical Activity: Inactive   Days of Exercise per Week: 0 days   Minutes of Exercise per Session: 0 min  Stress: No Stress Concern Present   Feeling of Stress : Not at all  Social Connections: Moderately Isolated   Frequency of Communication with Friends and Family: More than three times a week   Frequency of Social Gatherings with Friends and Family: More than three times a week   Attends Religious Services: More than 4 times per year   Active Member of Clubs or Organizations: No   Attends Archivist Meetings: Never   Marital Status: Divorced   Vitals:   08/19/21 0951  BP: 134/80  Pulse: 79  Resp: 16  SpO2: 97%   Body mass index is 36.07 kg/m.  Physical Exam Vitals and nursing note reviewed.  Constitutional:      General: She is not in acute distress.    Appearance: She is well-developed.  HENT:     Head: Normocephalic and atraumatic.     Right Ear: Tympanic membrane, ear canal and external ear normal.     Left Ear: Tympanic membrane, ear canal and external ear normal.     Nose: Septal deviation, congestion and rhinorrhea present.     Right Turbinates: Enlarged.     Left Turbinates: Enlarged.     Mouth/Throat:     Mouth: Mucous membranes are moist.     Pharynx: Oropharynx is clear.     Comments: Post nasal drainage. Eyes:     Conjunctiva/sclera: Conjunctivae normal.  Cardiovascular:     Rate and Rhythm: Normal rate and regular rhythm.     Heart sounds: No murmur heard. Pulmonary:     Effort: Pulmonary effort is normal. No respiratory distress.     Breath sounds: Normal breath sounds.  Abdominal:     Palpations:  Abdomen is soft. There is no hepatomegaly or mass.     Tenderness: There is no abdominal tenderness.  Musculoskeletal:     Cervical back: Spasms and tenderness  present. No bony tenderness. Decreased range of motion.       Back:  Lymphadenopathy:     Cervical: No cervical adenopathy.  Skin:    General: Skin is warm.     Findings: No erythema or rash.  Neurological:     General: No focal deficit present.     Mental Status: She is alert and oriented to person, place, and time.     Cranial Nerves: No cranial nerve deficit.     Gait: Gait normal.  Psychiatric:     Comments: Well groomed, good eye contact.   ASSESSMENT AND PLAN:  [name] was seen today for follow-up.  Diagnoses and all orders for this visit: Orders Placed This Encounter  Procedures   DG Cervical Spine Complete   Basic metabolic panel   Ambulatory referral to Physical Therapy   Lab Results  Component Value Date   CREATININE 1.62 (H) 08/19/2021   BUN 22 08/19/2021   NA 139 08/19/2021   K 3.7 08/19/2021   CL 104 08/19/2021   CO2 27 08/19/2021   Stage 3a chronic kidney disease ([place]) Cr usually 1.3-1.4 and e GFR 40. Adequate hydration, low salt diet, glucose and BP controlled,as well as avoidance of NSAID's to continue.  Seasonal allergic rhinitis due to pollen I do not think there is an underlying infectious process at this time. Nasal saline irrigations as needed. Flonase nasal spray daily for a few days then prn.  Neck pain on right side Topical icy hot or asper cream + local massage may help. Cervical X ray ordered today. PT will be arranged.  Hypertension with heart disease BP adequately controlled. ContinueTriamterene-HCTZ 37.5-25 mg daily, Metoprolol succinate 100 mg daily,and Losartan 100 mg daily. Low salt diet. Monitor BP regularly.  Return if symptoms worsen or fail to improve, for Keep next f/u appt..  Herson Prichard G. [name], MD  [facility]. [place] office.

## 2021-08-22 ENCOUNTER — Encounter: Payer: Self-pay | Admitting: Family Medicine

## 2021-08-25 ENCOUNTER — Telehealth: Payer: Self-pay | Admitting: Pharmacist

## 2021-08-25 NOTE — Chronic Care Management (AMB) (Signed)
Chronic Care Management Pharmacy Assistant   Name: [name]  MRN: [id number] DOB: 1946-06-26  Reason for Encounter: Medication Review / Hypertension Assessment Call   Conditions to be addressed/monitored: HTN  Recent office visits:  08/19/2021 [name] MD - Patient was seen for Stage 3a chronic kidney disease and additional issues. Discontinued Benzonatate.  Recent consult visits:  None  Hospital visits:  Patient was seen at [facility] ED on 08/01/2021 for 1 hour due to Upper respiratory tract infection.    New?Medications Started at [facility] Discharge:?? amoxicillin 500 MG capsule Take 2 capsules (1,000 mg total) by mouth 2 (two) times daily. benzonatate 100 MG capsule Take 1 capsule (100 mg total) by mouth every 8 (eight) hours. ondansetron 4 MG disintegrating tablet 4mg  ODT q4 hours prn nausea/vomit Medication Changes at Hospital Discharge: -No medication changes Medications Discontinued at Hospital Discharge: -No medications discontinued Medications that remain the same after Hospital Discharge:??  -All other medications will remain the same.    Medications: Outpatient Encounter Medications as of 08/25/2021  Medication Sig Note   acetaminophen (TYLENOL) 500 MG tablet Take 1,000 mg by mouth as needed for moderate pain or headache.  05/19/2021: prn   albuterol (VENTOLIN HFA) 108 (90 Base) MCG/ACT inhaler INHALE ONE OR TWO PUFFS BY MOUTH INTO THE LUNGS EVERY SIX HOURS AS NEEDED FOR WHEEZING OR SHORTNESS OF BREATH    Alirocumab (PRALUENT) 75 MG/ML SOAJ Inject 75 mg into the skin every 14 (fourteen) days.    amoxicillin (AMOXIL) 500 MG capsule Take 2 capsules (1,000 mg total) by mouth 2 (two) times daily.    aspirin 81 MG chewable tablet Chew 81 mg by mouth daily.    Continuous Blood Gluc Receiver (FREESTYLE LIBRE 2 READER) DEVI Use to check blood sugar, dx:e11.9    Continuous Blood Gluc Sensor (FREESTYLE LIBRE 2 SENSOR) MISC Use to check blood sugar,  dx:e11.9    cyclobenzaprine (FLEXERIL) 10 MG tablet Take 1 tablet (10 mg total) by mouth 2 (two) times daily as needed for muscle spasms.    ezetimibe (ZETIA) 10 MG tablet Take 1 tablet (10 mg total) by mouth daily.    famotidine (PEPCID) 20 MG tablet Take 1 tablet (20 mg total) by mouth 2 (two) times daily.    fluticasone (FLONASE) 50 MCG/ACT nasal spray PLACE 2 SPRAYS INTO BOTH NOSTRILS AT BEDTIME AS NEEDED FOR ALLERGIES.    Fluticasone-Umeclidin-Vilant (TRELEGY ELLIPTA) 100-62.5-25 MCG/INH AEPB Inhale 1 puff into the lungs daily.    gabapentin (NEURONTIN) 300 MG capsule Take 2 capsules (600 mg total) by mouth 2 (two) times daily.    losartan (COZAAR) 100 MG tablet Take 1 tablet (100 mg total) by mouth daily.    metoprolol succinate (TOPROL-XL) 100 MG 24 hr tablet Take 1 tablet (100 mg total) by mouth daily. Take with or immediately following a meal.    montelukast (SINGULAIR) 10 MG tablet TAKE ONE TABLET BY MOUTH EVERYDAY AT BEDTIME    MYRBETRIQ 25 MG TB24 tablet Take 25 mg by mouth daily. 05/19/2021: Patient was taking but in the donut hole with insurance,too $ high   nitroGLYCERIN (NITROSTAT) 0.4 MG SL tablet Place 1 tablet (0.4 mg total) under the tongue every 5 (five) minutes as needed for chest pain.    ondansetron (ZOFRAN-ODT) 4 MG disintegrating tablet 4mg  ODT q4 hours prn nausea/vomit    pantoprazole (PROTONIX) 40 MG tablet Take 1 tablet (40 mg total) by mouth daily.    polyethylene glycol powder (GLYCOLAX/MIRALAX) powder Take 17  g by mouth daily.    rosuvastatin (CRESTOR) 20 MG tablet Take 1 tablet (20 mg total) by mouth daily.    triamterene-hydrochlorothiazide (MAXZIDE-25) 37.5-25 MG tablet Take 0.5 tablets by mouth daily.    No facility-administered encounter medications on file as of 08/25/2021.   Reviewed chart for medication changes ahead of medication coordination call.  No OVs, Consults, or hospital visits since last care coordination call/Pharmacist visit. (If appropriate,  list visit date, provider name)  No medication changes indicated OR if recent visit, treatment plan here.  BP Readings from Last 3 Encounters:  08/19/21 134/80  08/01/21 108/73  06/16/21 134/80    Lab Results  Component Value Date   HGBA1C 5.9 04/17/2021     Patient obtains medications through Adherence Packaging  30 Days    Last adherence delivery included:  Rosuvastatin 20 mg at bedtime Ezetimibe 10 mg at breakfast Losartan 100 mg at breakfast Metoprolol Suc 100 mg at bedtime Gabapentin 300 mg 2 pills twice daily Montelukast 10 mg at bedtime Famotidine 20 mg twice daily Triamterene HCTZ 37.5/25 mg 1/2 tablet daily  Patient declined the following medications: Patient did not decline any medications.   Patient is due for next adherence delivery on: 09/04/2021.   Called patient and reviewed medications and coordinated delivery.   This delivery to include: Rosuvastatin 20 mg at bedtime Losartan 100 mg at breakfast Metoprolol Suc 100 mg at bedtime Gabapentin 300 mg 2 pills twice daily Montelukast 10 mg at bedtime Famotidine 20 mg twice daily   Patient will need a short fill: No short fills needed   Coordinated acute fill: No acute fills needed   Patient declined the following medications:  Ezetimibe, patient states she has plenty on hand.  Triamterene HCTZ, patient states she was advised by Dr. [name] to stop this medication for a while due to recent lab results.    Confirmed delivery date of 09/04/2021, advised patient that pharmacy will contact them the morning of delivery.  Care Gaps: AWV - scheduled 08/27/2021 Last BP - 134/80 on 08/19/2021 Last HGA1C was 5.9 on 04/17/2021 Shingrix - never done Foot exam - overdue Covid - overdue  Star Rating Drugs: Losartan 100 mg - last filled 05/25/2021 74DS at [facility] Rosuvastatin 20 mg - last filled 06/03/2021 61 DS at [place] [phone]

## 2021-08-27 ENCOUNTER — Ambulatory Visit (INDEPENDENT_AMBULATORY_CARE_PROVIDER_SITE_OTHER): Payer: Medicare HMO

## 2021-08-27 VITALS — BP 122/62 | Temp 98.7°F | Ht 66.0 in | Wt 228.8 lb

## 2021-08-27 DIAGNOSIS — Z0000 Encounter for general adult medical examination without abnormal findings: Secondary | ICD-10-CM | POA: Diagnosis not present

## 2021-08-27 NOTE — Progress Notes (Signed)
Subjective:   [name] is a 76 y.o. female who presents for Medicare Annual (Subsequent) preventive examination.  Review of Systems    No ROS Cardiac Risk Factors include: advanced age (>83men, >74 women);hypertension;diabetes mellitus     Objective:    Today's Vitals   08/27/21 1048  BP: 122/62  Temp: 98.7 F (37.1 C)  SpO2: 98%  Weight: 228 lb 12.8 oz (103.8 kg)  Height: 5\' 6"  (1.676 m)   Body mass index is 36.93 kg/m.  Advanced Directives 08/27/2021 08/01/2021 05/19/2021 01/25/2021 08/30/2020 08/26/2020 10/16/2019  Does Patient Have a Medical Advance Directive? Yes Yes No No Yes Yes Yes  Type of Paramedic of [place];Living will [place];Living will - - - [place];Living will Living will;Healthcare Power of Attorney  Does patient want to make changes to medical advance directive? No - Patient declined - - - - No - Patient declined No - Patient declined  Copy of [place] in Chart? No - copy requested - - - - No - copy requested -  Would patient like information on creating a medical advance directive? - - - - - - -    Current Medications (verified) Outpatient Encounter Medications as of 08/27/2021  Medication Sig   acetaminophen (TYLENOL) 500 MG tablet Take 1,000 mg by mouth as needed for moderate pain or headache.    albuterol (VENTOLIN HFA) 108 (90 Base) MCG/ACT inhaler INHALE ONE OR TWO PUFFS BY MOUTH INTO THE LUNGS EVERY SIX HOURS AS NEEDED FOR WHEEZING OR SHORTNESS OF BREATH   Alirocumab (PRALUENT) 75 MG/ML SOAJ Inject 75 mg into the skin every 14 (fourteen) days.   amoxicillin (AMOXIL) 500 MG capsule Take 2 capsules (1,000 mg total) by mouth 2 (two) times daily.   aspirin 81 MG chewable tablet Chew 81 mg by mouth daily.   Continuous Blood Gluc Receiver (FREESTYLE LIBRE 2 READER) DEVI Use to check blood sugar, dx:e11.9   Continuous Blood Gluc Sensor (FREESTYLE LIBRE 2 SENSOR) MISC Use  to check blood sugar, dx:e11.9   cyclobenzaprine (FLEXERIL) 10 MG tablet Take 1 tablet (10 mg total) by mouth 2 (two) times daily as needed for muscle spasms.   ezetimibe (ZETIA) 10 MG tablet Take 1 tablet (10 mg total) by mouth daily.   famotidine (PEPCID) 20 MG tablet Take 1 tablet (20 mg total) by mouth 2 (two) times daily.   fluticasone (FLONASE) 50 MCG/ACT nasal spray PLACE 2 SPRAYS INTO BOTH NOSTRILS AT BEDTIME AS NEEDED FOR ALLERGIES.   Fluticasone-Umeclidin-Vilant (TRELEGY ELLIPTA) 100-62.5-25 MCG/INH AEPB Inhale 1 puff into the lungs daily.   gabapentin (NEURONTIN) 300 MG capsule Take 2 capsules (600 mg total) by mouth 2 (two) times daily.   losartan (COZAAR) 100 MG tablet Take 1 tablet (100 mg total) by mouth daily.   metoprolol succinate (TOPROL-XL) 100 MG 24 hr tablet Take 1 tablet (100 mg total) by mouth daily. Take with or immediately following a meal.   montelukast (SINGULAIR) 10 MG tablet TAKE ONE TABLET BY MOUTH EVERYDAY AT BEDTIME   MYRBETRIQ 25 MG TB24 tablet Take 25 mg by mouth daily.   nitroGLYCERIN (NITROSTAT) 0.4 MG SL tablet Place 1 tablet (0.4 mg total) under the tongue every 5 (five) minutes as needed for chest pain.   ondansetron (ZOFRAN-ODT) 4 MG disintegrating tablet 4mg  ODT q4 hours prn nausea/vomit   pantoprazole (PROTONIX) 40 MG tablet Take 1 tablet (40 mg total) by mouth daily.   polyethylene glycol  powder (GLYCOLAX/MIRALAX) powder Take 17 g by mouth daily.   rosuvastatin (CRESTOR) 20 MG tablet Take 1 tablet (20 mg total) by mouth daily.   [DISCONTINUED] triamterene-hydrochlorothiazide (MAXZIDE-25) 37.5-25 MG tablet Take 0.5 tablets by mouth daily. (Patient not taking: Reported on 08/27/2021)   No facility-administered encounter medications on file as of 08/27/2021.    Allergies (verified) Oxycodone-acetaminophen, Percocet [oxycodone-acetaminophen], Pravastatin sodium, Hydrocodone, and Aspirin   History: Past Medical History:  Diagnosis Date   Allergy     Anxiety    C. difficile diarrhea    history of   CAD (coronary artery disease), native coronary artery    a. 05/2017 showed 100% mLCx tx with DES, otherwise 20% mRCA, 50% ramus, 30% dLAD, 20% prox-mid LAD, 20% mLAD treated medically.    Cancer [facility])    Right kidney CA removed.    CKD (chronic kidney disease) stage 3, GFR 30-59 ml/min ([place]) 10/11/2016   s/p R nephrectomy   Colon polyps 2008   HYPERPLASTIC   Diabetes mellitus without complication ([place])    type 2  no meds   Gastritis    GERD (gastroesophageal reflux disease)    History of echocardiogram    Echo 3/18:  Moderate LVH, EF 01-02, grade 1 diastolic dysfunction, calcified aortic valve, mild MR, moderate LAE   History of nuclear stress test    Myoview 3/18: Mod size and intensity fixed septal defect, may be artifact.Opposite mod size and intensity lat defect, which is reversible and could represent ischemia or possibly artifact (SDS 4). LVEF 71% with normal wall motion. Intermediate risk study. >> images reviewed with Dr. Dorris Carnes - no sig ischemia; med rx    Hx of cardiovascular stress test    Lexiscan Myoview 6/16:  EF 70%, no scar or ischemia; Low Risk   Hyperlipidemia    Hypertension    Neuropathy    Orthostatic hypotension 05/28/2017   OSA (obstructive sleep apnea) 01/16/2021   Osteoarthritis    Rheumatoid arthritis ([place])    RA (Dr. Ouida Sills) Bilateral hands   S/P angioplasty with stent 05/27/17 to LCX with DES  05/28/2017   Thyroid disease    Past Surgical History:  Procedure Laterality Date   ABDOMINAL HYSTERECTOMY  1978   ANTERIOR CERVICAL DECOMP/DISCECTOMY FUSION N/A 05/29/2018   Procedure: ANTERIOR CERVICAL DECOMPRESSION FUSION - CERVICAL FIVE-CERVICAL SIX - CERVICAL SIX-CERVICAL SEVEN;  Surgeon: Earnie Larsson, MD;  Location: [place];  Service: Neurosurgery;  Laterality: N/A;   BACK SURGERY     x 2   CARPAL TUNNEL RELEASE Left    COLONOSCOPY W/ BIOPSIES AND POLYPECTOMY     Hx: of   CORONARY STENT INTERVENTION  N/A 05/27/2017   Procedure: CORONARY STENT INTERVENTION;  Surgeon: Burnell Blanks, MD;  Location: [place] [facility];  Service: Cardiovascular;  Laterality: N/A;   ESOPHAGOGASTRODUODENOSCOPY     HNP     LEFT HEART CATH AND CORONARY ANGIOGRAPHY N/A 05/27/2017   Procedure: LEFT HEART CATH AND CORONARY ANGIOGRAPHY;  Surgeon: Burnell Blanks, MD;  Location: [place] [facility];  Service: Cardiovascular;  Laterality: N/A;   LUMBAR LAMINECTOMY/DECOMPRESSION MICRODISCECTOMY Left 02/12/2013   Procedure: LUMBAR TWO THREE, LUMBAR THREE FOUR, LUMBAR FOUR FIVE  LAMINECTOMY/DECOMPRESSION MICRODISCECTOMY 3 LEVELS;  Surgeon: Charlie Pitter, MD;  Location: [facility];  Service: Neurosurgery;  Laterality: Left;   NEPHRECTOMY Right 2010   10.rcc cancer   TOTAL KNEE ARTHROPLASTY Right    Redo   Family History  Problem Relation Age of Onset  Rheum arthritis Mother    Stroke Mother    Prostate cancer Father    Heart disease Father    Diabetes Brother    Dementia Brother    Parkinsonism Brother    Kidney disease Son    Diabetes Brother    Cancer Brother    Dementia Brother    Colon cancer Neg Hx    Esophageal cancer Neg Hx    Pancreatic cancer Neg Hx    Liver disease Neg Hx    Rectal cancer Neg Hx    Stomach cancer Neg Hx    Social History   Socioeconomic History   Marital status: Divorced    Spouse name: Not on file   Number of children: 3   Years of education: 12   Highest education level: Not on file  Occupational History   Occupation: Retired    Fish farm manager: RETIRED  Tobacco Use   Smoking status: Every Day    Packs/day: 0.20    Years: 15.00    Pack years: 3.00    Types: Cigarettes    Start date: 1970   Smokeless tobacco: Never   Tobacco comments:    pt has been an on and off smoker since age 48  Vaping Use   Vaping Use: Never used  Substance and Sexual Activity   Alcohol use: No    Alcohol/week: 0.0 standard drinks   Drug use: No   Sexual activity: Not on file   Other Topics Concern   Not on file  Social History Narrative   Single, dgtr lives with her   Current on/off smoker   Alcohol use- no   Drug use-no   Regular Exercise-yes   Social Determinants of Health   Financial Resource Strain: Low Risk    Difficulty of Paying Living Expenses: Not hard at all  Food Insecurity: No Food Insecurity   Worried About Charity fundraiser in the Last Year: Never true   [place] in the Last Year: Never true  Transportation Needs: No Transportation Needs   Lack of Transportation (Medical): No   Lack of Transportation (Non-Medical): No  Physical Activity: Inactive   Days of Exercise per Week: 0 days   Minutes of Exercise per Session: 0 min  Stress: No Stress Concern Present   Feeling of Stress : Only a little  Social Connections: Moderately Integrated   Frequency of Communication with Friends and Family: More than three times a week   Frequency of Social Gatherings with Friends and Family: More than three times a week   Attends Religious Services: More than 4 times per year   Active Member of [employer] or Organizations: Yes   Attends Music therapist: More than 4 times per year   Marital Status: Divorced     Clinical Intake:  Pre-visit preparation completed: YesDiabetic?  Yes Interpreter Needed?: No  Activities of Daily Living In your present state of health, do you have any difficulty performing the following activities: 08/27/2021  Hearing? N  Vision? N  Difficulty concentrating or making decisions? N  Walking or climbing stairs? N  Dressing or bathing? N  Doing errands, shopping? N  Preparing Food and eating ? N  Using the Toilet? N  In the past six months, have you accidently leaked urine? Y  Comment Followed by Dr Milford Cage  Do you have problems with loss of bowel control? N  Managing your Medications? N  Managing your Finances? N  Housekeeping or managing your Housekeeping? N  Some recent data might be hidden     Patient Care Team: [name], [name], MD as PCP - General (Family Medicine) Fay Records, MD as PCP - Cardiology (Cardiology) Viona Gilmore, [facility] as Pharmacist (Pharmacist)  Indicate any recent Medical Services you may have received from other than [facility] providers in the past year (date may be approximate).     Assessment:   This is a routine wellness examination for [facility].  Hearing/Vision screen Hearing Screening - Comments:: No difficulty hearing Vision Screening - Comments:: Wears glasses. Followed by [facility]  Dietary issues and exercise activities discussed: Current Exercise Habits: The patient does not participate in regular exercise at present, Exercise limited by: None identified   Goals Addressed             This Visit's Progress    Increase physical activity       Would like to get back to water exercises.       Depression Screen PHQ 2/9 Scores 08/27/2021 08/27/2021 08/19/2021 08/26/2020 03/26/2019 06/08/2016 12/04/2014  PHQ - 2 Score 2 0 2 0 1 0 0  PHQ- 9 Score 4 0 5 - - - -    Fall Risk Fall Risk  08/27/2021 08/19/2021 08/26/2020 03/26/2020 03/26/2019  Falls in the past year? 0 0 1 0 1  Number falls in past yr: 0 - 0 - 1  Injury with Fall? 0 - 0 - 0  Risk for fall due to : - - History of fall(s) - -  Risk for fall due to: Comment - - - - -  Follow up - - Falls evaluation completed;Falls prevention discussed - -    FALL RISK PREVENTION PERTAINING TO THE HOME:  Any stairs in or around the home? Yes  If so, are there any without handrails? No  Home free of loose throw rugs in walkways, pet beds, electrical cords, etc? Yes  Adequate lighting in your home to reduce risk of falls? Yes   ASSISTIVE DEVICES UTILIZED TO PREVENT FALLS:  Life alert? No  Use of a cane, walker or w/c? No  Grab bars in the bathroom? No  Shower chair or bench in shower? No  Elevated toilet seat or a handicapped toilet? No   TIMED UP AND GO:  Was the test performed? Yes .   Length of time to ambulate 10 feet: 5 sec.   Gait steady and fast without use of assistive device  Cognitive Function:     6CIT Screen 08/27/2021  What Year? 0 points  What month? 0 points  What time? 0 points  Count back from 20 0 points  Months in reverse 0 points  Repeat phrase 0 points  Total Score 0    Immunizations Immunization History  Administered Date(s) Administered   Fluad Quad(high Dose 65+) 05/05/2019, 06/09/2020, 04/17/2021   Influenza Split 05/23/2012, 06/07/2013   Influenza Whole 08/02/2005, 05/08/2007, 05/09/2008, 08/19/2009, 06/03/2010   Influenza, High Dose Seasonal PF 06/20/2014, 05/13/2015, 06/08/2016, 05/23/2017, 05/18/2018   PFIZER Comirnaty(Gray Top)Covid-19 Tri-Sucrose Vaccine 12/17/2020   Pfizer Covid-19 Vaccine Bivalent Booster 6yrs & up 06/17/2021   Pneumococcal Conjugate-13 02/27/2007   Pneumococcal Polysaccharide-23 02/27/2007, 12/16/2011, 10/16/2014, 05/26/2017   Td 08/02/2001   Tdap 12/16/2011   Zoster, Live 12/27/2012    Covid-19 vaccine status: Completed vaccines  Qualifies for Shingles Vaccine? Yes   Zostavax completed No   Shingrix Completed?: No.    Education has been provided regarding the importance of this vaccine. Patient has been advised to  call insurance company to determine out of pocket expense if they have not yet received this vaccine. Advised may also receive vaccine at local pharmacy or Health Dept. Verbalized acceptance and understanding.  Screening Tests Health Maintenance  Topic Date Due   COVID-19 Vaccine (3 - Pfizer risk series) 09/12/2021 (Originally 07/15/2021)   Zoster Vaccines- Shingrix (1 of 2) 11/25/2021 (Originally 01/22/1965)   HEMOGLOBIN A1C  10/15/2021   TETANUS/TDAP  12/15/2021   MAMMOGRAM  01/13/2022   OPHTHALMOLOGY EXAM  04/28/2022   FOOT EXAM  08/19/2022   COLONOSCOPY (Pts 45-57yrs Insurance coverage will need to be confirmed)  02/05/2023   Pneumonia Vaccine 90+ Years old  Completed   INFLUENZA  VACCINE  Completed   DEXA SCAN  Completed   Hepatitis C Screening  Completed   HPV VACCINES  Aged Out    Health Maintenance  There are no preventive care reminders to display for this patient.    Additional Screening:   Vision Screening: Recommended annual ophthalmology exams for early detection of glaucoma and other disorders of the eye. Is the patient up to date with their annual eye exam?  Yes  Who is the provider or what is the name of the office in which the patient attends annual eye exams? Followed    Dental Screening: Recommended annual dental exams for proper oral hygiene  Community Resource Referral / Chronic Care Management:  CRR required this visit?  No   CCM required this visit?  No      Plan:     I have personally reviewed and noted the following in the patients chart:   Medical and social history Use of alcohol, tobacco or illicit drugs  Current medications and supplements including opioid prescriptions. Patient not currently taking opioids Functional ability and status Nutritional status Physical activity Advanced directives List of other physicians Hospitalizations, surgeries, and ER visits in previous 12 months Vitals Screenings to include cognitive, depression, and falls Referrals and appointments  In addition, I have reviewed and discussed with patient certain preventive protocols, quality metrics, and best practice recommendations. A written personalized care plan for preventive services as well as general preventive health recommendations were provided to patient.     Criselda Peaches, LPN   [id number]/[id number]   Nurse Notes:  Patient requires f/u request for  personal counseling.

## 2021-08-27 NOTE — Patient Instructions (Addendum)
[name] , Thank you for taking time to come for your Medicare Wellness Visit. I appreciate your ongoing commitment to your health goals. Please review the following plan we discussed and let me know if I can assist you in the future.   These are the goals we discussed:  Goals      Exercise 3x per week (30 min per time)     Increase physical activity     Would like to get back to water exercises.     Manage My Medicine     Timeframe:  Long-Range Goal Priority:  Medium Start Date:                             Expected End Date:                       Follow Up Date 08/31/21    - call for medicine refill 2 or 3 days before it runs out - keep a list of all the medicines I take; vitamins and herbals too - use a pillbox to sort medicine - use an alarm clock or phone to remind me to take my medicine    Why is this important?   These steps will help you keep on track with your medicines.   Notes:      Weight (lb) < 200 lb (90.7 kg)        This is a list of the screening recommended for you and due dates:  Health Maintenance  Topic Date Due   COVID-19 Vaccine (3 - Pfizer risk series) 09/12/2021*   Zoster (Shingles) Vaccine (1 of 2) 11/25/2021*   Hemoglobin A1C  10/15/2021   Tetanus Vaccine  12/15/2021   Mammogram  01/13/2022   Eye exam for diabetics  04/28/2022   Complete foot exam   08/19/2022   Colon Cancer Screening  02/05/2023   Pneumonia Vaccine  Completed   Flu Shot  Completed   DEXA scan (bone density measurement)  Completed   Hepatitis C Screening: USPSTF Recommendation to screen - Ages 61-79 yo.  Completed   HPV Vaccine  Aged Out  *Topic was postponed. The date shown is not the original due date.   Advanced directives: Yes  Conditions/risks identified: None  Next appointment: Follow up in one year for your annual wellness visit    Preventive Care 65 Years and Older, Female Preventive care refers to lifestyle choices and visits with your health care provider  that can promote health and wellness. What does preventive care include? A yearly physical exam. This is also called an annual well check. Dental exams once or twice a year. Routine eye exams. Ask your health care provider how often you should have your eyes checked. Personal lifestyle choices, including: Daily care of your teeth and gums. Regular physical activity. Eating a healthy diet. Avoiding tobacco and drug use. Limiting alcohol use. Practicing safe sex. Taking low-dose aspirin every day. Taking vitamin and mineral supplements as recommended by your health care provider. What happens during an annual well check? The services and screenings done by your health care provider during your annual well check will depend on your age, overall health, lifestyle risk factors, and family history of disease. Counseling  Your health care provider may ask you questions about your: Alcohol use. Tobacco use. Drug use. Emotional well-being. Home and relationship well-being. Sexual activity. Eating habits. History of falls. Memory and ability to  understand (cognition). Work and work Statistician. Reproductive health. Screening  You may have the following tests or measurements: Height, weight, and BMI. Blood pressure. Lipid and cholesterol levels. These may be checked every 5 years, or more frequently if you are over 54 years old. Skin check. Lung cancer screening. You may have this screening every year starting at age 82 if you have a 30-pack-year history of smoking and currently smoke or have quit within the past 15 years. Fecal occult blood test (FOBT) of the stool. You may have this test every year starting at age 70. Flexible sigmoidoscopy or colonoscopy. You may have a sigmoidoscopy every 5 years or a colonoscopy every 10 years starting at age 68. Hepatitis C blood test. Hepatitis B blood test. Sexually transmitted disease (STD) testing. Diabetes screening. This is done by checking  your blood sugar (glucose) after you have not eaten for a while (fasting). You may have this done every 1-3 years. Bone density scan. This is done to screen for osteoporosis. You may have this done starting at age 72. Mammogram. This may be done every 1-2 years. Talk to your health care provider about how often you should have regular mammograms. Talk with your health care provider about your test results, treatment options, and if necessary, the need for more tests. Vaccines  Your health care provider may recommend certain vaccines, such as: Influenza vaccine. This is recommended every year. Tetanus, diphtheria, and acellular pertussis (Tdap, Td) vaccine. You may need a Td booster every 10 years. Zoster vaccine. You may need this after age 59. Pneumococcal 13-valent conjugate (PCV13) vaccine. One dose is recommended after age 2. Pneumococcal polysaccharide (PPSV23) vaccine. One dose is recommended after age 101. Talk to your health care provider about which screenings and vaccines you need and how often you need them. This information is not intended to replace advice given to you by your health care provider. Make sure you discuss any questions you have with your health care provider. Document Released: 08/15/2015 Document Revised: 04/07/2016 Document Reviewed: 05/20/2015 Elsevier Interactive Patient Education  2017 [employer] Prevention in the Home Falls can cause injuries. They can happen to people of all ages. There are many things you can do to make your home safe and to help prevent falls. What can I do on the outside of my home? Regularly fix the edges of walkways and driveways and fix any cracks. Remove anything that might make you trip as you walk through a door, such as a raised step or threshold. Trim any bushes or trees on the path to your home. Use bright outdoor lighting. Clear any walking paths of anything that might make someone trip, such as rocks or  tools. Regularly check to see if handrails are loose or broken. Make sure that both sides of any steps have handrails. Any raised decks and porches should have guardrails on the edges. Have any leaves, snow, or ice cleared regularly. Use sand or salt on walking paths during winter. Clean up any spills in your garage right away. This includes oil or grease spills. What can I do in the bathroom? Use night lights. Install grab bars by the toilet and in the tub and shower. Do not use towel bars as grab bars. Use non-skid mats or decals in the tub or shower. If you need to sit down in the shower, use a plastic, non-slip stool. Keep the floor dry. Clean up any water that spills on the floor as soon as it  happens. Remove soap buildup in the tub or shower regularly. Attach bath mats securely with double-sided non-slip rug tape. Do not have throw rugs and other things on the floor that can make you trip. What can I do in the bedroom? Use night lights. Make sure that you have a light by your bed that is easy to reach. Do not use any sheets or blankets that are too big for your bed. They should not hang down onto the floor. Have a firm chair that has side arms. You can use this for support while you get dressed. Do not have throw rugs and other things on the floor that can make you trip. What can I do in the kitchen? Clean up any spills right away. Avoid walking on wet floors. Keep items that you use a lot in easy-to-reach places. If you need to reach something above you, use a strong step stool that has a grab bar. Keep electrical cords out of the way. Do not use floor polish or wax that makes floors slippery. If you must use wax, use non-skid floor wax. Do not have throw rugs and other things on the floor that can make you trip. What can I do with my stairs? Do not leave any items on the stairs. Make sure that there are handrails on both sides of the stairs and use them. Fix handrails that are  broken or loose. Make sure that handrails are as long as the stairways. Check any carpeting to make sure that it is firmly attached to the stairs. Fix any carpet that is loose or worn. Avoid having throw rugs at the top or bottom of the stairs. If you do have throw rugs, attach them to the floor with carpet tape. Make sure that you have a light switch at the top of the stairs and the bottom of the stairs. If you do not have them, ask someone to add them for you. What else can I do to help prevent falls? Wear shoes that: Do not have high heels. Have rubber bottoms. Are comfortable and fit you well. Are closed at the toe. Do not wear sandals. If you use a stepladder: Make sure that it is fully opened. Do not climb a closed stepladder. Make sure that both sides of the stepladder are locked into place. Ask someone to hold it for you, if possible. Clearly mark and make sure that you can see: Any grab bars or handrails. First and last steps. Where the edge of each step is. Use tools that help you move around (mobility aids) if they are needed. These include: Canes. Walkers. Scooters. Crutches. Turn on the lights when you go into a dark area. Replace any light bulbs as soon as they burn out. Set up your furniture so you have a clear path. Avoid moving your furniture around. If any of your floors are uneven, fix them. If there are any pets around you, be aware of where they are. Review your medicines with your doctor. Some medicines can make you feel dizzy. This can increase your chance of falling. Ask your doctor what other things that you can do to help prevent falls. This information is not intended to replace advice given to you by your health care provider. Make sure you discuss any questions you have with your health care provider. Document Released: 05/15/2009 Document Revised: 12/25/2015 Document Reviewed: 08/23/2014 Elsevier Interactive Patient Education  2017 [employer].

## 2021-08-28 ENCOUNTER — Other Ambulatory Visit: Payer: Self-pay

## 2021-08-28 ENCOUNTER — Ambulatory Visit: Payer: Medicare HMO | Attending: Family Medicine

## 2021-08-28 DIAGNOSIS — M436 Torticollis: Secondary | ICD-10-CM | POA: Diagnosis not present

## 2021-08-28 DIAGNOSIS — R252 Cramp and spasm: Secondary | ICD-10-CM | POA: Diagnosis not present

## 2021-08-28 DIAGNOSIS — M542 Cervicalgia: Secondary | ICD-10-CM | POA: Insufficient documentation

## 2021-08-28 NOTE — Therapy (Signed)
[facility] @ [address] [place], [place], [address] Phone: [phone]   Fax:  [phone]  Physical Therapy Evaluation  Patient Details  Name: [name] MRN: [id number] Date of Birth: [birth year] Referring Provider (PT): [name], MD   Encounter Date: 08/28/2021   PT End of Session - 08/28/21 1913     Visit Number 1    Date for PT Re-Evaluation 10/23/21    Authorization Type AETNA MEDICARE    PT Start Time 1100    PT Stop Time 1155    PT Time Calculation (min) 55 min    Activity Tolerance Patient tolerated treatment well    Behavior During Therapy [facility] for tasks assessed/performed             Past Medical History:  Diagnosis Date   Allergy    Anxiety    C. difficile diarrhea    history of   CAD (coronary artery disease), native coronary artery    a. 05/2017 showed 100% mLCx tx with DES, otherwise 20% mRCA, 50% ramus, 30% dLAD, 20% prox-mid LAD, 20% mLAD treated medically.    Cancer [facility])    Right kidney CA removed.    CKD (chronic kidney disease) stage 3, GFR 30-59 ml/min ([place]) 10/11/2016   s/p R nephrectomy   Colon polyps 2008   HYPERPLASTIC   Diabetes mellitus without complication ([place])    type 2  no meds   Gastritis    GERD (gastroesophageal reflux disease)    History of echocardiogram    Echo 3/18:  Moderate LVH, EF 18-56, grade 1 diastolic dysfunction, calcified aortic valve, mild MR, moderate LAE   History of nuclear stress test    Myoview 3/18: Mod size and intensity fixed septal defect, may be artifact.Opposite mod size and intensity lat defect, which is reversible and could represent ischemia or possibly artifact (SDS 4). LVEF 71% with normal wall motion. Intermediate risk study. >> images reviewed with Dr. Dorris Carnes - no sig ischemia; med rx    Hx of cardiovascular stress test    Lexiscan Myoview 6/16:  EF 70%, no scar or ischemia; Low Risk   Hyperlipidemia    Hypertension    Neuropathy     Orthostatic hypotension 05/28/2017   OSA (obstructive sleep apnea) 01/16/2021   Osteoarthritis    Rheumatoid arthritis ([place])    RA (Dr. Ouida Sills) Bilateral hands   S/P angioplasty with stent 05/27/17 to LCX with DES  05/28/2017   Thyroid disease     Past Surgical History:  Procedure Laterality Date   ABDOMINAL HYSTERECTOMY  1978   ANTERIOR CERVICAL DECOMP/DISCECTOMY FUSION N/A 05/29/2018   Procedure: ANTERIOR CERVICAL DECOMPRESSION FUSION - CERVICAL FIVE-CERVICAL SIX - CERVICAL SIX-CERVICAL SEVEN;  Surgeon: Earnie Larsson, MD;  Location: [facility];  Service: Neurosurgery;  Laterality: N/A;   BACK SURGERY     x 2   CARPAL TUNNEL RELEASE Left    COLONOSCOPY W/ BIOPSIES AND POLYPECTOMY     Hx: of   CORONARY STENT INTERVENTION N/A 05/27/2017   Procedure: CORONARY STENT INTERVENTION;  Surgeon: Burnell Blanks, MD;  Location: [facility];  Service: Cardiovascular;  Laterality: N/A;   ESOPHAGOGASTRODUODENOSCOPY     HNP     LEFT HEART CATH AND CORONARY ANGIOGRAPHY N/A 05/27/2017   Procedure: LEFT HEART CATH AND CORONARY ANGIOGRAPHY;  Surgeon: Burnell Blanks, MD;  Location: [facility];  Service: Cardiovascular;  Laterality: N/A;   LUMBAR LAMINECTOMY/DECOMPRESSION MICRODISCECTOMY Left 02/12/2013  Procedure: LUMBAR TWO THREE, LUMBAR THREE FOUR, LUMBAR FOUR FIVE  LAMINECTOMY/DECOMPRESSION MICRODISCECTOMY 3 LEVELS;  Surgeon: Charlie Pitter, MD;  Location: [facility];  Service: Neurosurgery;  Laterality: Left;   NEPHRECTOMY Right 2010   10.rcc cancer   TOTAL KNEE ARTHROPLASTY Right    Redo    There were no vitals filed for this visit.    Subjective Assessment - 08/28/21 1109     Subjective Patient reports about one month of neck pain.  This started on the right but is now on left as well.  She rates her pain at 7.5-8/10.  She is retired and before pandemic, she went to water aerobics and was involved in her church.  She states she really enjoyed this but has not  resumed this.  She enjoys being with her children.  She hopes to gain control of her pain and resume some of these activities.  She is fused at C5-C7.  Xrays show degnerative changes at C4-C5.  She has also started using sleep apnea machine which is causing her to toss and turn through the night.    Diagnostic tests IMPRESSION:  1. No acute fracture or malalignment.  2. Anterior fusion V4-Q5 appears uncomplicated.  3. Progression of degenerative disc changes at C4-C5.    Currently in Pain? Yes    Pain Score 5     Pain Location Neck    Pain Orientation Right;Left    Pain Descriptors / Indicators Aching;Nagging    Pain Type Acute pain    Pain Radiating Towards has neuropathy and states both hands occasionally burn and tingle.    Pain Onset More than a month ago    Pain Frequency Intermittent                [facility] PT Assessment - 08/28/21 0001       Assessment   Medical Diagnosis M54.2 (ICD-10-CM) - Neck pain on right side    Referring Provider (PT) [name], MD    Onset Date/Surgical Date 07/28/22    Hand Dominance Right    Next MD Visit 6 weeks she thinks- not listed under appts    Prior Therapy no      Precautions   Precautions None      [place] residence    Living Arrangements Children    Type of [place] Access Level entry      Prior Function   Level of [place] Retired    [employer], exercise, "lunch buddy" at the schools      Cognition   Overall Cognitive Status Within Functional Limits for tasks assessed      Sensation   Light Touch Appears Intact      Posture/Postural Control   Posture/Postural Control Postural limitations    Postural Limitations Rounded Shoulders;Forward head      ROM / Strength   AROM / PROM / Strength AROM;Strength      AROM   Overall AROM Comments bilateral shoulder ROM WNL    AROM Assessment Site Cervical    Cervical Flexion wnl    Cervical  Extension wnl    Cervical - Right Side Bend 50%    Cervical - Left Side Bend 50%    Cervical - Right Rotation 75%    Cervical - Left Rotation wnl      Strength   Overall Strength Within functional limits for tasks performed  Palpation   Palpation comment Tender to palpation bilateral upper traps and parascapular areas with trigger points                        Objective measurements completed on examination: See above findings.                PT Education - 08/28/21 1937     Education Details Access Code: [email or web address]    Person(s) Educated Patient    Methods Explanation;Demonstration;Verbal cues;Handout    Comprehension Verbalized understanding;Returned demonstration;Verbal cues required              PT Short Term Goals - 08/28/21 1937       PT SHORT TERM GOAL #1   Title Independence with initial HEP    Time 4    Period Weeks    Status New    Target Date 09/25/21               PT Long Term Goals - 08/28/21 1938       PT LONG TERM GOAL #1   Title be indpendent in advanced HEP    Time 8    Period Weeks    Status New    Target Date 10/23/21      PT LONG TERM GOAL #2   Title Patient to have full painfree cervical ROM    Time 8    Period Weeks    Status New    Target Date 10/23/21      PT LONG TERM GOAL #3   Title Patient to be able to resume prior exercise routine at the [facility]    Time 8    Period Weeks    Status New    Target Date 10/23/21      PT LONG TERM GOAL #4   Title Patient t be able to sleep through the night without neck pain waking her.    Time 8    Period Weeks    Status New    Target Date 10/23/21                    Plan - 08/28/21 2001     Clinical Impression Statement Patient is a 76 y.o. female who reports neck pain for about one month.  She has history of shoulder issues.  She presents with limited cervical rotation and side bending.  Right shoulder demonstrates some mild ROM limitation  and discomfort.  She has weakness with empty can test and tenderness to palpation bilateral upper traps and parascapular areas.  history of cervical fusion C5-C7, poor posture and is neurologically intact.  She would benefit from postural strengthening, passive cervical ROM, soft tissue mobilization and possibly dry needling.    Personal Factors and Comorbidities Fitness;Comorbidity 1    Examination-Activity Limitations Dressing;Bathing;Bed Mobility;Sleep;Reach Overhead    Examination-Participation Restrictions Laundry;Cleaning;Driving    Stability/Clinical Decision Making Stable/Uncomplicated    Clinical Decision Making Low    Rehab Potential Good    PT Frequency 2x / week    PT Duration 8 weeks    PT Treatment/Interventions ADLs/Self Care Home Management;Aquatic Therapy;Traction;Moist Heat;Iontophoresis 4mg /ml Dexamethasone;Electrical Stimulation;Cryotherapy;Ultrasound;Gait training;Therapeutic exercise;Therapeutic activities;Functional mobility training;Stair training;Neuromuscular re-education;Patient/family education;Manual techniques;Passive range of motion;Taping;Dry needling;Spinal Manipulations;Joint Manipulations    PT Next Visit Plan Begin PROM cervical spine along with soft tissue mobilization and dry needling if patient agrees.    PT Home Exercise Plan Access Code: [email or web address]    Consulted and Agree with Plan of  Care Patient             Patient will benefit from skilled therapeutic intervention in order to improve the following deficits and impairments:  Pain, Postural dysfunction, Impaired UE functional use, Increased fascial restricitons, Decreased strength, Decreased range of motion  Visit Diagnosis: Cervicalgia - Plan: PT plan of care cert/re-cert  Stiffness of neck - Plan: PT plan of care cert/re-cert  Cramp and spasm - Plan: PT plan of care cert/re-cert     Problem List Patient Active Problem List   Diagnosis Date Noted   OSA (obstructive sleep apnea) 01/16/2021    COPD mixed type ([place]) 01/16/2021   Fatigue 11/03/2020   B12 deficiency 11/03/2020   Chest pain 08/30/2020   Malignant neoplasm of kidney excluding renal pelvis, unspecified laterality ([place]) 09/26/2019   Vitamin D deficiency, unspecified 09/26/2019   Anxiety disorder, unspecified 03/26/2019   Tobacco use disorder 09/29/2018   Cervical myelopathy ([place]) 05/29/2018   ANA positive 12/08/2017   Cervicalgia 09/16/2017   Carpal tunnel syndrome of left wrist 07/21/2017   Chondrocalcinosis 07/21/2017   Neuropathy 07/21/2017   Orthostatic hypotension 05/28/2017   S/P angioplasty with stent 05/27/17 to LCX with DES  05/28/2017   CAD (coronary artery disease)    Polyneuropathy 05/25/2017   Chest pain with moderate risk for cardiac etiology 05/25/2017   Class 1 obesity with serious comorbidity and body mass index (BMI) of 34.0 to 34.9 in adult 12/06/2016   CKD (chronic kidney disease) stage 3, GFR 30-59 ml/min ([facility]) 10/11/2016   Hot flashes, menopausal 10/01/2016   Chest pain with moderate risk of acute coronary syndrome 10/25/2014   Degenerative spondylolisthesis 10/15/2014   Spinal stenosis, lumbar region, with neurogenic claudication 02/12/2013   DM (diabetes mellitus), type 2 with renal complications ([place]) 44/08/270   History of Rtr nephrectomy 2010, secondary to renal cell cancer 01/14/2009   PERSONAL HX COLONIC POLYPS 11/14/2008   Hyperlipidemia associated with type 2 diabetes mellitus ([place]) 11/16/2007   ESOPHAGEAL STRICTURE 10/12/2007   Rheumatoid arthritis ([place]) 02/27/2007   Hypertension with heart disease 01/19/2007   Allergic rhinitis 01/19/2007   GERD 01/19/2007    Anderson Malta B. [place] Giammona, PT 01/27/238:40 PM   [facility] @ [address] [place], [place], [address] Phone: [phone]   Fax:  [phone]  Name: [name] MRN: [id number] Date of Birth: 1946/02/03

## 2021-08-28 NOTE — Patient Instructions (Signed)
Access Code: [email or web address] URL: [email or web address] Date: 08/28/2021 Prepared by: Candyce Churn  Exercises Shoulder extension with resistance - Neutral - 1 x daily - 7 x weekly - 3 sets - 10 reps Standing Shoulder Row with Anchored Resistance - 1 x daily - 7 x weekly - 3 sets - 10 reps Shoulder External Rotation and Scapular Retraction with Resistance - 1 x daily - 7 x weekly - 3 sets - 10 reps Standing Shoulder Horizontal Abduction with Resistance - 1 x daily - 7 x weekly - 3 sets - 10 reps

## 2021-08-31 DIAGNOSIS — Z20828 Contact with and (suspected) exposure to other viral communicable diseases: Secondary | ICD-10-CM | POA: Diagnosis not present

## 2021-08-31 DIAGNOSIS — Z03818 Encounter for observation for suspected exposure to other biological agents ruled out: Secondary | ICD-10-CM | POA: Diagnosis not present

## 2021-08-31 DIAGNOSIS — U071 COVID-19: Secondary | ICD-10-CM | POA: Diagnosis not present

## 2021-09-02 DIAGNOSIS — G4733 Obstructive sleep apnea (adult) (pediatric): Secondary | ICD-10-CM | POA: Diagnosis not present

## 2021-09-04 ENCOUNTER — Telehealth: Payer: Self-pay | Admitting: Internal Medicine

## 2021-09-04 DIAGNOSIS — E669 Obesity, unspecified: Secondary | ICD-10-CM | POA: Diagnosis not present

## 2021-09-04 DIAGNOSIS — I2510 Atherosclerotic heart disease of native coronary artery without angina pectoris: Secondary | ICD-10-CM | POA: Diagnosis not present

## 2021-09-04 DIAGNOSIS — G8929 Other chronic pain: Secondary | ICD-10-CM | POA: Diagnosis not present

## 2021-09-04 DIAGNOSIS — F325 Major depressive disorder, single episode, in full remission: Secondary | ICD-10-CM | POA: Diagnosis not present

## 2021-09-04 DIAGNOSIS — R69 Illness, unspecified: Secondary | ICD-10-CM | POA: Diagnosis not present

## 2021-09-04 DIAGNOSIS — E1142 Type 2 diabetes mellitus with diabetic polyneuropathy: Secondary | ICD-10-CM | POA: Diagnosis not present

## 2021-09-04 DIAGNOSIS — J449 Chronic obstructive pulmonary disease, unspecified: Secondary | ICD-10-CM | POA: Diagnosis not present

## 2021-09-04 DIAGNOSIS — J301 Allergic rhinitis due to pollen: Secondary | ICD-10-CM | POA: Diagnosis not present

## 2021-09-04 DIAGNOSIS — M069 Rheumatoid arthritis, unspecified: Secondary | ICD-10-CM | POA: Diagnosis not present

## 2021-09-04 DIAGNOSIS — F17210 Nicotine dependence, cigarettes, uncomplicated: Secondary | ICD-10-CM | POA: Diagnosis not present

## 2021-09-04 DIAGNOSIS — G4733 Obstructive sleep apnea (adult) (pediatric): Secondary | ICD-10-CM | POA: Diagnosis not present

## 2021-09-04 DIAGNOSIS — I10 Essential (primary) hypertension: Secondary | ICD-10-CM | POA: Diagnosis not present

## 2021-09-04 DIAGNOSIS — E785 Hyperlipidemia, unspecified: Secondary | ICD-10-CM | POA: Diagnosis not present

## 2021-09-04 NOTE — Telephone Encounter (Signed)
Patient has upcoming appt with Dr. Annamaria Boots on 09/07/2021.  Routing to [place] to make aware.

## 2021-09-04 NOTE — Progress Notes (Signed)
HPI F Smoker (1 pk/ week, 15 pkyrs followed for OSA, Bronchiectasis, complicated by  Orthostasis, HTN, CAD/ stent, Allergic Rhinitis, GERD,  NPSG 10/07/18- AHI 65.8/ hr, desaturation to 89%, body weight 209 lbs HST 02/09/21- AHI 50.5/ hr, desaturation to a nadir of 70%- average 87%, body weight 226 lbs PFT 05/04/21- Mild airtrapping but normal flows w/o resp to BD. DLCO is high for volume.  ===============================================================  05/04/21- 33 yoF Smoker (1 pk/ week, 15 pkyrs followed for OSA, Bronchiectasis, complicated by  Orthostasis, HTN, CAD/ stent, Aortic Atherosclerosis, Allergic Rhinitis, GERD,  HST 02/09/21- AHI 50.5/ hr, desaturation to a nadir of 70%- average 87%, body weight 226 lbs Son is [name] -Albuterol hfa, Singulair, Flonase  No CPAP yet Body weight today-229 lbs Covid vax-1 Phizer Flu vax-had Still waiting for her CPAP machine. She continues light smoking and has mild bronchiectasis on chest CT from [place]. PFT 05/04/21- Mild airtrapping but normal flows w/o resp to BD. DLCO is high for volume.  09/07/21-75 yoF Smoker (1 pk/ week, 15 pkyrs followed for OSA, Bronchiectasis, complicated by  Orthostasis, HTN, CAD/ stent, Aortic Atherosclerosis, Allergic Rhinitis, GERD,  HST 02/09/21- AHI 50.5/ hr, desaturation to a nadir of 70%- average 87%, body weight 226 lbs Son is [name] -Albuterol hfa, Singulair, Flonase CPAP auto 4-10/ [facility] Body weight today-224 lbs                           daughter here Covid vax-1 Phizer Flu vax-had ED visit for viral syndrome 12/31- given amox to hold. Not wearing CPAP- can't tolerate the mask. We put her in touch with the pharmacy smoking cessation team who told her she did not qualify because she "was not smoking enough". We are working with her bronchiectasis.  She may need more attention to pulmonary toilet and may need a maintenance controller inhaler.  For now we are refilling her albuterol rescue inhaler,  trying to get her off of cigarettes and updating CXR.  ROS-see HPI   + = positive Constitutional:    weight loss, night sweats, fevers, chills, fatigue, lassitude. HEENT:    headaches, +difficulty swallowing, tooth/dental problems, sore throat,       sneezing, itching, ear ache, +nasal congestion, post nasal drip, snoring CV:    chest pain, orthopnea, PND, swelling in lower extremities, anasarca,      dizziness, palpitations Resp:   +shortness of breath with exertion or at rest.                +productive cough,   non-productive cough, coughing up of blood.              change in color of mucus.  wheezing.   Skin:    rash or lesions. GI:  No-   heartburn, indigestion, abdominal pain, nausea, vomiting, diarrhea,                 change in bowel habits, loss of appetite GU: dysuria, change in color of urine, no urgency or frequency.   flank pain. MS:   +joint pain, stiffness, decreased range of motion, back pain. Neuro-     +dizzy Psych:  change in mood or affect.  depression or anxiety.   memory loss.  OBJ- Physical Exam General- Alert, Oriented, Affect-appropriate, Distress- none acute, + obese Skin- rash-none, lesions- none, excoriation- none Lymphadenopathy- none Head- atraumatic            Eyes- Gross vision intact, PERRLA,  conjunctivae and secretions clear            Ears- Hearing, canals-normal            Nose- Clear, no-Septal dev, mucus, polyps, erosion, perforation             Throat- Mallampati III, mucosa clear , drainage- none, +tonsils, + teeth Neck- flexible , trachea midline, no stridor , thyroid nl, carotid no bruit Chest - symmetrical excursion , unlabored           Heart/CV- RRR , no murmur , no gallop  , no rub, nl s1 s2                           - JVD- none , edema- none, stasis changes- none, varices- none           Lung- +coarse, wheeze- none, cough- none , dullness-none, rub- none           Chest wall-  Abd-  Br/ Gen/ Rectal- Not done, not indicated Extrem-  cyanosis- none, clubbing, none, atrophy- none, strength- nl Neuro- grossly intact to observation

## 2021-09-04 NOTE — Telephone Encounter (Signed)
FYISeth Bake from [employer] called and wanted to let [facility] know that they have not received any data on patient. They are going to contact patient to get the card for a download and will send information to [place] once they get the data.

## 2021-09-07 ENCOUNTER — Encounter: Payer: Self-pay | Admitting: Internal Medicine

## 2021-09-07 ENCOUNTER — Ambulatory Visit (INDEPENDENT_AMBULATORY_CARE_PROVIDER_SITE_OTHER): Payer: Medicare HMO

## 2021-09-07 ENCOUNTER — Other Ambulatory Visit: Payer: Self-pay

## 2021-09-07 ENCOUNTER — Ambulatory Visit: Payer: Medicare HMO | Admitting: Internal Medicine

## 2021-09-07 VITALS — BP 134/76 | HR 66 | Temp 98.2°F | Ht 66.0 in | Wt 224.2 lb

## 2021-09-07 DIAGNOSIS — R69 Illness, unspecified: Secondary | ICD-10-CM | POA: Diagnosis not present

## 2021-09-07 DIAGNOSIS — G4733 Obstructive sleep apnea (adult) (pediatric): Secondary | ICD-10-CM

## 2021-09-07 DIAGNOSIS — J439 Emphysema, unspecified: Secondary | ICD-10-CM

## 2021-09-07 DIAGNOSIS — J449 Chronic obstructive pulmonary disease, unspecified: Secondary | ICD-10-CM

## 2021-09-07 DIAGNOSIS — F17200 Nicotine dependence, unspecified, uncomplicated: Secondary | ICD-10-CM | POA: Diagnosis not present

## 2021-09-07 DIAGNOSIS — R062 Wheezing: Secondary | ICD-10-CM | POA: Diagnosis not present

## 2021-09-07 MED ORDER — ALBUTEROL SULFATE HFA 108 (90 BASE) MCG/ACT IN AERS: INHALATION_SPRAY | RESPIRATORY_TRACT | 12 refills | Status: DC

## 2021-09-07 NOTE — Patient Instructions (Signed)
Order- DME [facility]- please install AirView. Continue CPAP auto 4-10, mask of choice, supplies.  Order- CXR   dx COPD mixed type  I recommend you try using nicotine patches- probably start with 14 mg and taper gradually. Use these instead of smoking.  Script sent to your drug store for generic albuterol inhaler   inhale 2 puffs every 6 hours if needed for wheeze and shortness of breath.

## 2021-09-09 ENCOUNTER — Other Ambulatory Visit: Payer: Self-pay

## 2021-09-09 ENCOUNTER — Ambulatory Visit: Payer: Medicare HMO | Attending: Family Medicine

## 2021-09-09 DIAGNOSIS — M542 Cervicalgia: Secondary | ICD-10-CM | POA: Diagnosis not present

## 2021-09-09 DIAGNOSIS — G8929 Other chronic pain: Secondary | ICD-10-CM | POA: Diagnosis not present

## 2021-09-09 DIAGNOSIS — M25512 Pain in left shoulder: Secondary | ICD-10-CM | POA: Diagnosis not present

## 2021-09-09 DIAGNOSIS — M25612 Stiffness of left shoulder, not elsewhere classified: Secondary | ICD-10-CM | POA: Insufficient documentation

## 2021-09-09 DIAGNOSIS — R293 Abnormal posture: Secondary | ICD-10-CM | POA: Diagnosis not present

## 2021-09-09 DIAGNOSIS — R252 Cramp and spasm: Secondary | ICD-10-CM | POA: Diagnosis not present

## 2021-09-09 DIAGNOSIS — M436 Torticollis: Secondary | ICD-10-CM | POA: Insufficient documentation

## 2021-09-09 NOTE — Therapy (Signed)
[facility] @ [place], [place], [address] Phone: [phone]   Fax:  [phone]  Physical Therapy Treatment  Patient Details  Name: MARIETTA SIKKEMA MRN: [id number] Date of Birth: [birth year] Referring Provider (PT): [name], MD   Encounter Date: 09/09/2021   PT End of Session - 09/09/21 1443     Visit Number 2    Date for PT Re-Evaluation 10/23/21    Authorization Type AETNA MEDICARE    PT Start Time 1401    PT Stop Time 2119    PT Time Calculation (min) 42 min    Activity Tolerance Patient tolerated treatment well    Behavior During Therapy [facility] for tasks assessed/performed             Past Medical History:  Diagnosis Date   Allergy    Anxiety    C. difficile diarrhea    history of   CAD (coronary artery disease), native coronary artery    a. 05/2017 showed 100% mLCx tx with DES, otherwise 20% mRCA, 50% ramus, 30% dLAD, 20% prox-mid LAD, 20% mLAD treated medically.    Cancer [facility])    Right kidney CA removed.    CKD (chronic kidney disease) stage 3, GFR 30-59 ml/min ([place]) 10/11/2016   s/p R nephrectomy   Colon polyps 2008   HYPERPLASTIC   Diabetes mellitus without complication ([place])    type 2  no meds   Gastritis    GERD (gastroesophageal reflux disease)    History of echocardiogram    Echo 3/18:  Moderate LVH, EF 41-74, grade 1 diastolic dysfunction, calcified aortic valve, mild MR, moderate LAE   History of nuclear stress test    Myoview 3/18: Mod size and intensity fixed septal defect, may be artifact.Opposite mod size and intensity lat defect, which is reversible and could represent ischemia or possibly artifact (SDS 4). LVEF 71% with normal wall motion. Intermediate risk study. >> images reviewed with Dr. Dorris Carnes - no sig ischemia; med rx    Hx of cardiovascular stress test    Lexiscan Myoview 6/16:  EF 70%, no scar or ischemia; Low Risk   Hyperlipidemia    Hypertension    Neuropathy     Orthostatic hypotension 05/28/2017   OSA (obstructive sleep apnea) 01/16/2021   Osteoarthritis    Rheumatoid arthritis ([place])    RA (Dr. Ouida Sills) Bilateral hands   S/P angioplasty with stent 05/27/17 to LCX with DES  05/28/2017   Thyroid disease     Past Surgical History:  Procedure Laterality Date   ABDOMINAL HYSTERECTOMY  1978   ANTERIOR CERVICAL DECOMP/DISCECTOMY FUSION N/A 05/29/2018   Procedure: ANTERIOR CERVICAL DECOMPRESSION FUSION - CERVICAL FIVE-CERVICAL SIX - CERVICAL SIX-CERVICAL SEVEN;  Surgeon: Earnie Larsson, MD;  Location: [facility];  Service: Neurosurgery;  Laterality: N/A;   BACK SURGERY     x 2   CARPAL TUNNEL RELEASE Left    COLONOSCOPY W/ BIOPSIES AND POLYPECTOMY     Hx: of   CORONARY STENT INTERVENTION N/A 05/27/2017   Procedure: CORONARY STENT INTERVENTION;  Surgeon: Burnell Blanks, MD;  Location: [place] [facility] CV LAB;  Service: Cardiovascular;  Laterality: N/A;   ESOPHAGOGASTRODUODENOSCOPY     HNP     LEFT HEART CATH AND CORONARY ANGIOGRAPHY N/A 05/27/2017   Procedure: LEFT HEART CATH AND CORONARY ANGIOGRAPHY;  Surgeon: Burnell Blanks, MD;  Location: [place] CV LAB;  Service: Cardiovascular;  Laterality: N/A;   LUMBAR LAMINECTOMY/DECOMPRESSION MICRODISCECTOMY Left 02/12/2013  Procedure: LUMBAR TWO THREE, LUMBAR THREE FOUR, LUMBAR FOUR FIVE  LAMINECTOMY/DECOMPRESSION MICRODISCECTOMY 3 LEVELS;  Surgeon: Charlie Pitter, MD;  Location: [facility] NEURO ORS;  Service: Neurosurgery;  Laterality: Left;   NEPHRECTOMY Right 2010   10.rcc cancer   TOTAL KNEE ARTHROPLASTY Right    Redo    There were no vitals filed for this visit.   Subjective Assessment - 09/09/21 1403     Subjective My neck feels about the same.  I had sinus pain and didn't feel good so I didn't come to PT.    Diagnostic tests IMPRESSION:  1. No acute fracture or malalignment.  2. Anterior fusion Q7-H4 appears uncomplicated.  3. Progression of degenerative disc changes at C4-C5.    Currently in  Pain? Yes    Pain Score 7     Pain Location Neck    Pain Orientation Right    Pain Descriptors / Indicators Aching;Nagging    Pain Onset More than a month ago    Pain Frequency Intermittent    Aggravating Factors  turning head    Pain Relieving Factors not turning head.                               [facility] Adult PT Treatment/Exercise - 09/09/21 0001       Exercises   Exercises Shoulder;Neck      Neck Exercises: Theraband   Shoulder Extension 10 reps;Red    Rows 10 reps;Red    Shoulder External Rotation 10 reps;Red    Horizontal ABduction 10 reps;Red      Neck Exercises: Stretches   Upper Trapezius Stretch 2 reps;20 seconds;Left;Right    Other Neck Stretches rotation and flexion 2x20 seconds each      Manual Therapy   Manual Therapy Soft tissue mobilization;Myofascial release    Manual therapy comments skilled palpation and monitoring with DN    Soft tissue mobilization elongation to bil neck and upper traps              Trigger Point Dry Needling - 09/09/21 0001     Consent Given? Yes    Education Handout Provided Yes    Muscles Treated Head and Neck Upper trapezius;Cervical multifidi;Suboccipitals    Upper Trapezius Response Palpable increased muscle length;Twitch reponse elicited    Suboccipitals Response Twitch response elicited;Palpable increased muscle length    Cervical multifidi Response Twitch reponse elicited;Palpable increased muscle length                   PT Education - 09/09/21 1411     Education Details Access Code: [email or web address], DN info    Person(s) Educated Patient    Methods Explanation;Demonstration;Handout    Comprehension Verbalized understanding;Returned demonstration              PT Short Term Goals - 08/28/21 1937       PT SHORT TERM GOAL #1   Title Independence with initial HEP    Time 4    Period Weeks    Status New    Target Date 09/25/21               PT Long Term Goals - 08/28/21 1938        PT LONG TERM GOAL #1   Title be indpendent in advanced HEP    Time 8    Period Weeks    Status New    Target Date 10/23/21  PT LONG TERM GOAL #2   Title Patient to have full painfree cervical ROM    Time 8    Period Weeks    Status New    Target Date 10/23/21      PT LONG TERM GOAL #3   Title Patient to be able to resume prior exercise routine at the [facility]    Time 8    Period Weeks    Status New    Target Date 10/23/21      PT LONG TERM GOAL #4   Title Patient t be able to sleep through the night without neck pain waking her.    Time 8    Period Weeks    Status New    Target Date 10/23/21                   Plan - 09/09/21 1421     Clinical Impression Statement First time follow-up after evaluation.  Pt has not been performing her HEP due to sinus issues last week.  Pt reports no change in symptoms.  Review of HEP today and pt required significant cueing for scapular depression and alignment.  PT added cervical A/ROM for tissue mobility.  Pt had good response to DN with twitch response and improved tissue mobility after session.   Pt with 3/10 pain post session, improved from 7/10 when she arrived.  Pt will continue to benefit from skilled PT to address cervical flexibility, postural strength and tissue mobility to address pain.    Rehab Potential Good    PT Frequency 2x / week    PT Duration 8 weeks    PT Treatment/Interventions ADLs/Self Care Home Management;Aquatic Therapy;Traction;Moist Heat;Iontophoresis 4mg /ml Dexamethasone;Electrical Stimulation;Cryotherapy;Ultrasound;Gait training;Therapeutic exercise;Therapeutic activities;Functional mobility training;Stair training;Neuromuscular re-education;Patient/family education;Manual techniques;Passive range of motion;Taping;Dry needling;Spinal Manipulations;Joint Manipulations    PT Next Visit Plan assess response to DN and repeat if helpful, review band exercises again, work on scapular depression    PT Home  Exercise Plan Access Code: [email or web address]    Recommended Other Services initial cert is signed    Consulted and Agree with Plan of Care Patient             Patient will benefit from skilled therapeutic intervention in order to improve the following deficits and impairments:  Pain, Postural dysfunction, Impaired UE functional use, Increased fascial restricitons, Decreased strength, Decreased range of motion  Visit Diagnosis: Cervicalgia  Stiffness of neck  Cramp and spasm     Problem List Patient Active Problem List   Diagnosis Date Noted   OSA (obstructive sleep apnea) 01/16/2021   COPD mixed type ([place]) 01/16/2021   Fatigue 11/03/2020   B12 deficiency 11/03/2020   Chest pain 08/30/2020   Malignant neoplasm of kidney excluding renal pelvis, unspecified laterality ([place]) 09/26/2019   Vitamin D deficiency, unspecified 09/26/2019   Anxiety disorder, unspecified 03/26/2019   Tobacco use disorder 09/29/2018   Cervical myelopathy ([place]) 05/29/2018   ANA positive 12/08/2017   Cervicalgia 09/16/2017   Carpal tunnel syndrome of left wrist 07/21/2017   Chondrocalcinosis 07/21/2017   Neuropathy 07/21/2017   Orthostatic hypotension 05/28/2017   S/P angioplasty with stent 05/27/17 to LCX with DES  05/28/2017   CAD (coronary artery disease)    Polyneuropathy 05/25/2017   Chest pain with moderate risk for cardiac etiology 05/25/2017   Class 1 obesity with serious comorbidity and body mass index (BMI) of 34.0 to 34.9 in adult 12/06/2016   CKD (chronic kidney disease) stage 3, GFR 30-59 ml/min ([facility])  10/11/2016   Hot flashes, menopausal 10/01/2016   Chest pain with moderate risk of acute coronary syndrome 10/25/2014   Degenerative spondylolisthesis 10/15/2014   Spinal stenosis, lumbar region, with neurogenic claudication 02/12/2013   DM (diabetes mellitus), type 2 with renal complications ([place]) 16/05/9603   History of Rtr nephrectomy 2010, secondary to renal cell cancer 01/14/2009    PERSONAL HX COLONIC POLYPS 11/14/2008   Hyperlipidemia associated with type 2 diabetes mellitus ([place]) 11/16/2007   ESOPHAGEAL STRICTURE 10/12/2007   Rheumatoid arthritis ([place]) 02/27/2007   Hypertension with heart disease 01/19/2007   Allergic rhinitis 01/19/2007   GERD 01/19/2007   Sigurd Sos, PT 09/09/21 2:44 PM  [facility] @ [address] [place], [place], [address] Phone: [phone]   Fax:  [phone]  Name: SHAREN YOUNGREN MRN: [id number] Date of Birth: [birth year]

## 2021-09-09 NOTE — Patient Instructions (Addendum)
Access Code: [email or web address] URL: [email or web address] Date: 09/09/2021 Prepared by: Claiborne Billings  Exercises  Seated Cervical Flexion AROM - 3 x daily - 7 x weekly - 3 reps - 1 sets - 20 hold Seated Cervical Sidebending AROM - 3 x daily - 7 x weekly - 3 reps - 1 sets - 20 hold Seated Cervical Rotation AROM - 3 x daily - 7 x weekly - 3 reps - 1 sets - 20 hold Seated Correct Posture - 1 x daily - 7 x weekly - 10 reps - 3 sets  Trigger Point Dry Needling  What is Trigger Point Dry Needling (DN)? DN is a physical therapy technique used to treat muscle pain and dysfunction. Specifically, DN helps deactivate muscle trigger points (muscle knots).  A thin filiform needle is used to penetrate the skin and stimulate the underlying trigger point. The goal is for a local twitch response (LTR) to occur and for the trigger point to relax. No medication of any kind is injected during the procedure.   What Does Trigger Point Dry Needling Feel Like?  The procedure feels different for each individual patient. Some patients report that they do not actually feel the needle enter the skin and overall the process is not painful. Very mild bleeding may occur. However, many patients feel a deep cramping in the muscle in which the needle was inserted. This is the local twitch response.   How Will I feel after the treatment? Soreness is normal, and the onset of soreness may not occur for a few hours. Typically this soreness does not last longer than two days.  Bruising is uncommon, however; ice can be used to decrease any possible bruising.  In rare cases feeling tired or nauseous after the treatment is normal. In addition, your symptoms may get worse before they get better, this period will typically not last longer than 24 hours.   What Can I do After My Treatment? Increase your hydration by drinking more water for the next 24 hours. You may place ice or heat on the areas treated that have become sore, however,  do not use heat on inflamed or bruised areas. Heat often brings more relief post needling. You can continue your regular activities, but vigorous activity is not recommended initially after the treatment for 24 hours. DN is best combined with other physical therapy such as strengthening, stretching, and other therapies.  [facility]  [address] [place] [address].  [phone]

## 2021-09-09 NOTE — Telephone Encounter (Signed)
Noted  

## 2021-09-14 NOTE — Assessment & Plan Note (Addendum)
Failed to tolerate CPAP despite efforts.  Not a good candidate for alternative therapies.  Hopefully we can make her more comfortable and keep trying.[name] Kitchen

## 2021-09-14 NOTE — Assessment & Plan Note (Signed)
Chronic bronchitis/bronchiectasis pattern. Plan-refill albuterol rescue inhaler, CXR          Consider maintenance controller next visit

## 2021-09-14 NOTE — Assessment & Plan Note (Signed)
Did not qualify to participate with the pharmacy smoking cessation team because she "was not smoking enough". Plan-NicoDerm CQ

## 2021-09-16 ENCOUNTER — Telehealth: Payer: Self-pay | Admitting: Internal Medicine

## 2021-09-16 DIAGNOSIS — G4733 Obstructive sleep apnea (adult) (pediatric): Secondary | ICD-10-CM

## 2021-09-17 ENCOUNTER — Telehealth: Payer: Self-pay | Admitting: Pharmacist

## 2021-09-17 NOTE — Chronic Care Management (AMB) (Signed)
° ° °  Chronic Care Management Pharmacy Assistant   Name: ANETH SCHLAGEL  MRN: [id number] DOB: 04-06-1946  09/21/2021 APPOINTMENT REMINDER   [name] was reminded to have all medications, supplements and any blood glucose and blood pressure readings available for review with [name], Pharm. D, at her telephone visit on 09/21/2021 at 2:00.   Questions: Have you had any recent office visit or specialist visit outside of [facility]? Patient denies any recent visits outside of [facility]  Are there any concerns you would like to discuss during your office visit? Patient would like to discuss hot flashes at night, she wants to know if her medications could be causing this. She is concerned about Gabapentin causing her to be sleepy.  Are you having any problems obtaining your medications? (Whether it pharmacy issues or cost) Patient denies   If patient has any PAP medications ask if they are having any problems getting their PAP medication or refill?  Patient denies any medications coming from PAP.  Care Gaps: AWV - completed 08/27/2021 Last BP - 134/76 on 09/07/2021 Last HGA1C was 5.9 on 04/17/2021 Covid - overdue  Star Rating Drug: Losartan 100 mg - last filled 08/31/2021 30 DS at [facility] Rosuvastatin 20 mg - last filled 08/31/2021 30 DS at [facility]  Any gaps in medications fill history? No  Gennie Alma [facility]  Catering manager [phone]

## 2021-09-18 NOTE — Telephone Encounter (Signed)
Called patient and she states that she would like a new mask for her CPAP machine. She states that she wears a full face mask right now and is not comfortable at all. Told patient I would ask Dr Annamaria Boots what mask he would like her to try this time  Dr Annamaria Boots please advise

## 2021-09-18 NOTE — Telephone Encounter (Signed)
[facility] to [facility] for mask fitting/ desensitization

## 2021-09-18 NOTE — Telephone Encounter (Signed)
Called and let patient know we are placing a new order to get her refitted for a mask for her CPAP machine. Patient voiced understanding. Nothing further needed

## 2021-09-21 ENCOUNTER — Telehealth: Payer: Self-pay | Admitting: Internal Medicine

## 2021-09-21 ENCOUNTER — Ambulatory Visit (INDEPENDENT_AMBULATORY_CARE_PROVIDER_SITE_OTHER): Payer: Medicare HMO | Admitting: Pharmacist

## 2021-09-21 ENCOUNTER — Telehealth: Payer: Self-pay | Admitting: Pharmacist

## 2021-09-21 DIAGNOSIS — E785 Hyperlipidemia, unspecified: Secondary | ICD-10-CM

## 2021-09-21 DIAGNOSIS — E1169 Type 2 diabetes mellitus with other specified complication: Secondary | ICD-10-CM

## 2021-09-21 DIAGNOSIS — E1122 Type 2 diabetes mellitus with diabetic chronic kidney disease: Secondary | ICD-10-CM

## 2021-09-21 MED ORDER — PRALUENT 75 MG/ML ~~LOC~~ SOAJ
75.0000 mg | SUBCUTANEOUS | 11 refills | Status: DC
Start: 2021-09-21 — End: 2022-09-24

## 2021-09-21 NOTE — Progress Notes (Signed)
Chronic Care Management Pharmacy Note  09/29/2021 Name:  [name] MRN:  [id number] DOB:  [birth year]  Summary: LDL at goal < 70  Recommendations/Changes made from today's visit: -Applied for [employer] for [place] and requested new rx to be sent to the pharmacy -Recommended stepping down to 7 mg for nicotine patches and to not stop after the 14 mg dose -Recommended for pt to try the nicotine 68m lozenges for cravings  Plan: BP assessment in 1-2 months Follow up in 4 months  Subjective: MMARNIE FAZZINOis an 76y.o. year old female who is a primary patient of [name] MD.  The CCM team was consulted for assistance with disease management and care coordination needs.    Engaged with patient by telephone for follow up visit in response to provider referral for pharmacy case management and/or care coordination services.   Consent to Services:  The patient was given information about Chronic Care Management services, agreed to services, and gave verbal consent prior to initiation of services.  Please see initial visit note for detailed documentation.   Patient Care Team: JMartinique Rose G, MD as PCP - General (Family Medicine) [name] as PCP - Cardiology (Cardiology) PViona Blackwell [name] Pharmacist (Pharmacist)  Recent office visits: 08/27/21 BRolene Arbour LPN: Patient presented for AWV.  08/19/2021 Rose JMartiniqueMD - Patient was seen for Stage 3a chronic kidney disease and additional issues. Discontinued Benzonatate.  04/17/2021 Rose JMartiniqueMD (PCP) - Patient was seen for fatige and additional issues. Discontinued Duloxetine 343m No follow up noted.     Recent consult visits: 09/09/21 [name] (outpatient rehab): Patient presented for PT treatment.  09/07/2021 [name], MD (Pulmonary Disease) - Patient was seen for obstructive sleep apnea and COPD. Recommended NicoDerm CQ.  06/16/2021 PaDorris Blackwell (cardiology) - Coronary artery disease  involving native coronary artery of native heart without angina pectoris. No medication changes. Follow up in 9 months.  05/04/2021 [name], MD (Pulmonary Disease) - Patient was seen for obstructive sleep apnea. No medication changes. Follow up 30-90 days after new CPAP.    02/16/2021 ViAndrey Blackwell (Nuerology) - Patient was seen for Axonal neuropathy and additional issues. Changed Gabapentin from 100 mg 3 times daily to 600 mg 2 times daily. Follow up in 1 year.   02/09/2021 DaPietro Blackwell [facility] - Patient was seen for headache around the eyes.  No medication changes. Follow up in 6 weeks.   [facility]: [facility] due to Upper respiratory tract infection.     [facility] amoxicillin 500 MG capsule Take 2 capsules (1,000 mg total) by mouth 2 (two) times daily. benzonatate 100 MG capsule Take 1 capsule (100 mg total) by mouth every 8 (eight) hours. ondansetron 4 MG disintegrating tablet 71m43mDT q4 hours prn nausea/vomit Medication Changes at Hospital Discharge: -No medication changes Medications Discontinued at Hospital Discharge: -No medications discontinued Medications that remain the same after Hospital Discharge:??  -All other medications will remain the same.   [facility]) due to acute left sided low back pain without sciatica and an additional issue. Patient was started on Cyclobenzaprine 10 mg twice daily as needed. No medications were changed or discontinued.   Objective:  Lab Results  Component Value Date   CREATININE 1.62 (H) 08/19/2021   BUN  22 08/19/2021   GFR 30.44 (L) 08/19/2021   GFRNONAA 37 (L) 05/19/2021   GFRAA 40 (L) 07/07/2020   NA 139 08/19/2021   K 3.7 08/19/2021   CALCIUM 9.8 08/19/2021   CO2 27 08/19/2021   GLUCOSE 101 (H) 08/19/2021    Lab Results  Component Value  Date/Time   HGBA1C 5.9 04/17/2021 12:47 PM   HGBA1C 6.6 (H) 11/10/2020 09:26 AM   HGBA1C 6.3 (H) 03/26/2020 10:41 AM   FRUCTOSAMINE 226 11/10/2020 09:26 AM   FRUCTOSAMINE 257 03/26/2019 10:54 AM   GFR 30.44 (L) 08/19/2021 10:56 AM   GFR 51.41 (L) 09/26/2019 01:02 PM   MICROALBUR 5.8 03/26/2020 10:55 AM   MICROALBUR 16.9 (H) 09/26/2019 01:02 PM    Last diabetic Eye exam:  Lab Results  Component Value Date/Time   HMDIABEYEEXA No Retinopathy 04/28/2021 12:00 AM    Last diabetic Foot exam: No results found for: HMDIABFOOTEX   Lab Results  Component Value Date   CHOL 143 09/11/2020   HDL 56 09/11/2020   LDLCALC 69 09/11/2020   LDLDIRECT 171.5 12/06/2011   TRIG 98 09/11/2020   CHOLHDL 2.6 09/11/2020    Hepatic Function Latest Ref Rng & Units 05/19/2021 01/25/2021 11/10/2020  Total Protein 6.5 - 8.1 Blackwell/dL 8.0 7.7 6.9  Albumin 3.5 - 5.0 Blackwell/dL 4.0 3.5 -  AST 15 - 41 U/L 15 21 -  ALT 0 - 44 U/L 9 14 -  Alk Phosphatase 38 - 126 U/L 60 59 -  Total Bilirubin 0.3 - 1.2 mg/dL 0.5 0.6 -  Bilirubin, Direct 0.00 - 0.40 mg/dL - - -    Lab Results  Component Value Date/Time   TSH 2.91 11/10/2020 09:26 AM   TSH 1.87 09/26/2019 01:02 PM    CBC Latest Ref Rng & Units 05/19/2021 01/25/2021 09/01/2020  WBC 4.0 - 10.5 K/uL 7.2 7.6 8.4  Hemoglobin 12.0 - 15.0 [facility]/dL 15.1(H) 15.1(H) 13.7  Hematocrit 36.0 - 46.0 % 45.9 45.4 41.8  Platelets 150 - 400 K/uL 242 260 216    Lab Results  Component Value Date/Time   VD25OH 28 (L) 03/26/2020 10:41 AM   VD25OH 17.09 (L) 09/26/2019 01:02 PM   VD25OH 15.71 (L) 03/26/2019 10:54 AM    Clinical ASCVD: Yes  The ASCVD Risk score (Arnett DK, et al., 2019) failed to calculate for the following reasons:   The patient has a prior MI or stroke diagnosis    Depression screen [facility] 2/9 08/27/2021 08/27/2021 08/19/2021  Decreased Interest 1 0 1  Down, Depressed, Hopeless 1 0 1  PHQ - 2 Score 2 0 2  Altered sleeping 1 0 1  Tired, decreased energy 1 - 1  Change in  appetite 0 - 1  Feeling bad or failure about yourself  0 - 0  Trouble concentrating 0 - 0  Moving slowly or fidgety/restless 0 - 0  Suicidal thoughts 0 - 0  PHQ-9 Score 4 0 5  Difficult doing work/chores Not difficult at all - Somewhat difficult  Some recent data might be hidden     Social History   Tobacco Use  Smoking Status Every Day   Packs/day: 0.20   Years: 15.00   Pack years: 3.00   Types: Cigarettes   Start date: 1970  Smokeless Tobacco Never  Tobacco Comments   pt has been an on and off smoker since age 75   BP Readings from Last 3 Encounters:  09/07/21 134/76  08/27/21 122/62  08/19/21 134/80   Pulse Readings from  Last 3 Encounters:  09/07/21 66  08/19/21 79  08/01/21 (!) 50   Wt Readings from Last 3 Encounters:  09/07/21 224 lb 3.2 oz (101.7 kg)  08/27/21 228 lb 12.8 oz (103.8 kg)  08/19/21 223 lb 8 oz (101.4 kg)   BMI Readings from Last 3 Encounters:  09/07/21 36.19 kg/m  08/27/21 36.93 kg/m  08/19/21 36.07 kg/m    Assessment/Interventions: Review of patient past medical history, allergies, medications, health status, including review of consultants reports, laboratory and other test data, was performed as part of comprehensive evaluation and provision of chronic care management services.   SDOH:  (Social Determinants of Health) assessments and interventions performed: Yes   SDOH Screenings   Alcohol Screen: Not on file  Depression (PHQ2-9): Low Risk    PHQ-2 Score: 4  Financial Resource Strain: Low Risk    Difficulty of Paying Living Expenses: Not hard at all  Food Insecurity: No Food Insecurity   Worried About Charity fundraiser in the Last Year: Never true   Ran Out of Food in the Last Year: Never true  Housing: Low Risk    Last Housing Risk Score: 0  Physical Activity: Inactive   Days of Exercise per Week: 0 days   Minutes of Exercise per Session: 0 min  Social Connections: Moderately Integrated   Frequency of Communication with  Friends and Family: More than three times a week   Frequency of Social Gatherings with Friends and Family: More than three times a week   Attends Religious Services: More than 4 times per year   Active Member of [employer] or Organizations: Yes   Attends Music therapist: More than 4 times per year   Marital Status: Divorced  Stress: No Stress Concern Present   Feeling of Stress : Only a little  Tobacco Use: High Risk   Smoking Tobacco Use: Every Day   Smokeless Tobacco Use: Never   Passive Exposure: Not on file  Transportation Needs: No Transportation Needs   Lack of Transportation (Medical): No   Lack of Transportation (Non-Medical): No    CCM Care Plan  Allergies  Allergen Reactions   Oxycodone-Acetaminophen Hives and Itching    hallucinations   Percocet [Oxycodone-Acetaminophen] Hives, Itching and Other (See Comments)    hallucinations   Pravastatin Sodium Other (See Comments)    Muscle aches Muscle aches   Hydrocodone Other (See Comments)    "crazy dreams"   Aspirin Other (See Comments)    GI upset Other reaction(s): Other (See Comments) REACTION: GI upset    Medications Reviewed Today     Reviewed by Isabel Caprice, PT (Physical Therapist) on 09/29/21 at 1455  Med List Status: <None>   Medication Order Taking? Sig Documenting Provider Last Dose Status Informant  acetaminophen (TYLENOL) 500 MG tablet [id number] No Take 1,000 mg by mouth as needed for moderate pain or headache.  [provider] Taking Active Self           Med Note Eino Farber   Tue May 19, 2021  3:38 PM) prn  albuterol (VENTOLIN HFA) 108 (90 Base) MCG/ACT inhaler [id number]  INHALE ONE OR TWO PUFFS BY MOUTH INTO THE LUNGS EVERY SIX HOURS AS NEEDED FOR WHEEZING OR SHORTNESS OF BREATH Young, Clinton D, MD  Active   Alirocumab (PRALUENT) 75 MG/ML SOAJ [id number]  Inject 75 mg into the skin every 14 (fourteen) days. Fay Records, MD  Active   aspirin 81 MG chewable tablet  [id number] No  Chew 81 mg by mouth daily. [provider] Taking Active Self  Continuous Blood Gluc Receiver (FREESTYLE LIBRE 2 READER) DEVI [id number] No Use to check blood sugar, dx:e11.9 [name], Rose G, MD Taking Active Self  Continuous Blood Gluc Sensor (FREESTYLE LIBRE 2 SENSOR) [place] [id number] No Use to check blood sugar, dx:e11.9 [name], Rose G, MD Taking Active Self  cyclobenzaprine (FLEXERIL) 10 MG tablet [id number] No Take 1 tablet (10 mg total) by mouth 2 (two) times daily as needed for muscle spasms. Gareth Morgan, MD Taking Active   ezetimibe (ZETIA) 10 MG tablet [id number] No Take 1 tablet (10 mg total) by mouth daily. Fay Records, MD Taking Active   famotidine (PEPCID) 20 MG tablet [id number]  TAKE ONE TABLET BY MOUTH EVERY MORNING and TAKE ONE TABLET BY MOUTH EVERYDAY AT BEDTIME [name], Rose G, MD  Active   fluticasone [facility]) 50 MCG/ACT nasal spray [id number] No PLACE 2 SPRAYS INTO BOTH NOSTRILS AT BEDTIME AS NEEDED FOR ALLERGIES. [name], Rose G, MD Taking Active Self  Fluticasone-Umeclidin-Vilant (TRELEGY ELLIPTA) 100-62.5-25 MCG/INH AEPB [id number] No Inhale 1 puff into the lungs daily. Deneise Lever, MD Taking Active Self  gabapentin (NEURONTIN) 300 MG capsule [id number] No Take 2 capsules (600 mg total) by mouth 2 (two) times daily. Penumalli, Earlean Polka, MD Taking Active Self  losartan (COZAAR) 100 MG tablet [id number] No Take 1 tablet (100 mg total) by mouth daily. [name], Rose G, MD Taking Active   metoprolol succinate (TOPROL-XL) 100 MG 24 hr tablet [id number] No Take 1 tablet (100 mg total) by mouth daily. Take with or immediately following a meal. [name], Rose G, MD Taking Active   montelukast (SINGULAIR) 10 MG tablet [id number] No TAKE ONE TABLET BY MOUTH EVERYDAY AT BEDTIME [name], Rose G, MD Taking Active   MYRBETRIQ 25 MG TB24 tablet [id number] No Take 25 mg by mouth daily. [provider] Taking Active            Med Note Eino Farber   Tue  May 19, 2021  3:37 PM) Patient was taking but in the donut hole with insurance,too $ high  nitroGLYCERIN (NITROSTAT) 0.4 MG SL tablet [id number] No Place 1 tablet (0.4 mg total) under the tongue every 5 (five) minutes as needed for chest pain. Fay Records, MD Taking Active   ondansetron (ZOFRAN-ODT) 4 MG disintegrating tablet [id number] No 88m ODT q4 hours prn nausea/vomit FDeno Etienne DO Taking Active   pantoprazole (PROTONIX) 40 MG tablet [id number] Take 1 tablet (40 mg total) by mouth daily. PDarliss Cheney MD Taking Expired 09/01/21 2359 Self  polyethylene glycol powder (GLYCOLAX/MIRALAX) powder [id number] Take 17 [facility] by mouth daily. LLevin Erp PUtahTaking Active Self  rosuvastatin (CRESTOR) 20 MG tablet [id number] Take 1 tablet (20 mg total) by mouth daily. [name] Taking Active             Patient Active Problem List   Diagnosis Date Noted   OSA (obstructive sleep apnea) 01/16/2021   COPD mixed type ([place] 01/16/2021   Fatigue 11/03/2020   B12 deficiency 11/03/2020   Chest pain 08/30/2020   Malignant neoplasm of kidney excluding renal pelvis, unspecified laterality ([place] 09/26/2019   Vitamin D deficiency, unspecified 09/26/2019   Anxiety disorder, unspecified 03/26/2019   Tobacco use disorder 09/29/2018   Cervical myelopathy ([place] 05/29/2018   ANA positive 12/08/2017   Cervicalgia 09/16/2017   Carpal tunnel syndrome of left wrist 07/21/2017   Chondrocalcinosis 07/21/2017   Neuropathy  07/21/2017   Orthostatic hypotension 05/28/2017   S/P angioplasty with stent 05/27/17 to LCX with DES  05/28/2017   CAD (coronary artery disease)    Polyneuropathy 05/25/2017   Chest pain with moderate risk for cardiac etiology 05/25/2017   Class 1 obesity with serious comorbidity and body mass index (BMI) of 34.0 to 34.9 in adult 12/06/2016   CKD (chronic kidney disease) stage 3, GFR 30-59 ml/min (HCC) 10/11/2016   Hot flashes, menopausal 10/01/2016   Chest pain with  moderate risk of acute coronary syndrome 10/25/2014   Degenerative spondylolisthesis 10/15/2014   Spinal stenosis, lumbar region, with neurogenic claudication 02/12/2013   DM (diabetes mellitus), type 2 with renal complications ([place]) 21/30/8657   History of Rtr nephrectomy 2010, secondary to renal cell cancer 01/14/2009   PERSONAL HX COLONIC POLYPS 11/14/2008   Hyperlipidemia associated with type 2 diabetes mellitus ([place]) 11/16/2007   ESOPHAGEAL STRICTURE 10/12/2007   Rheumatoid arthritis ([place]) 02/27/2007   Hypertension with heart disease 01/19/2007   Allergic rhinitis 01/19/2007   GERD 01/19/2007    Immunization History  Administered Date(s) Administered   Fluad Quad(high Dose 65+) 05/05/2019, 06/09/2020, 04/17/2021   Influenza Split 05/23/2012, 06/07/2013   Influenza Whole 08/02/2005, 05/08/2007, 05/09/2008, 08/19/2009, 06/03/2010   Influenza, High Dose Seasonal PF 06/20/2014, 05/13/2015, 06/08/2016, 05/23/2017, 05/18/2018   PFIZER Comirnaty([place])Covid-19 Tri-Sucrose Vaccine 12/17/2020   Pfizer Covid-19 Vaccine Bivalent Booster 66yr & up 06/17/2021   Pneumococcal Conjugate-13 02/27/2007   Pneumococcal Polysaccharide-23 02/27/2007, 12/16/2011, 10/16/2014, 05/26/2017   Td 08/02/2001   Tdap 12/16/2011   Zoster, Live 12/27/2012   Patient is feeling sleepy with taking gabapentin in the morning. She is going to try to to move it to the afternoon instead.   Patient has been using the 14 mg nicotine patches and hasn't smoked in 3 weeks. She inquired about skipping the 7 mg all together and just stopped but advised against doing so. She has been taking off the patches before going to bed because she was having nightmares. She is also having nightsweats but this started before she started on the patches.  Conditions to be addressed/monitored:  Hypertension, Hyperlipidemia, Diabetes, Coronary Artery Disease, GERD, COPD, Tobacco use, Overactive Bladder, Gout, and Allergic  Rhinitis  Conditions addressed this visit: Hypertension, hyperlipidemia, tobacco use  Care Plan : CCM Pharmacy Care Plan  Updates made by PViona Blackwell RClaytonsince 09/29/2021 12:00 AM     Problem: Problem: Hypertension, Hyperlipidemia, Diabetes, Coronary Artery Disease, GERD, COPD, Tobacco use, Overactive Bladder, Gout, and Allergic Rhinitis      Long-Range Goal: Patient-Specific Goal   Start Date: 05/08/2021  Expected End Date: 05/08/2022  Recent Progress: On track  Priority: High  Note:   Current Barriers:  Unable to independently afford treatment regimen Unable to independently monitor therapeutic efficacy Unable to self administer medications as prescribed  Pharmacist Clinical Goal(s):  Patient will verbalize ability to afford treatment regimen achieve adherence to monitoring guidelines and medication adherence to achieve therapeutic efficacy through collaboration with PharmD and provider.   Interventions: 1:1 collaboration with JMartinique Rose G, MD regarding development and update of comprehensive plan of care as evidenced by provider attestation and co-signature Inter-disciplinary care team collaboration (see longitudinal plan of care) Comprehensive medication review performed; medication list updated in electronic medical record  Hypertension (BP goal <130/80) -Controlled -Current treatment: Losartan 100 mg 1 tablet daily - Appropriate, Query effective, Safe, Accessible Metoprolol succinate 100 mg 1 tablet daily - Appropriate, Query effective, Safe, Accessible -Medications previously tried: [name] -Current home  readings: 120-140s; (usually in morning - 3 times a week) -Current dietary habits: cooks with regular salt seldom; uses seasoned salt; doesn't buy much canned -Current exercise habits: not doing much right now -Denies hypotensive/hypertensive symptoms -Educated on BP goals and benefits of medications for prevention of heart attack, stroke and kidney  damage; Exercise goal of 150 minutes per week; Importance of home blood pressure monitoring; Proper BP monitoring technique; -Counseled to monitor BP at home a few times weekly, document, and provide log at future appointments -Counseled on diet and exercise extensively Recommended to continue current medication  Hyperlipidemia: (LDL goal < 55) -Controlled -Current treatment: Rosuvastatin 20 mg 1 tablet daily -Appropriate, Query effective, Safe, Accessible Ezetimibe 10 mg 1 tablet daily - Appropriate, Query effective, Safe, Accessible Praluent 75 mg inject every 14 days - Appropriate, Query effective, Safe, Accessible -Medications previously tried: none  -Current dietary patterns: tries to stay away from fried foods and usually has baked and broiled and doesn't eat beef much -Current exercise habits: not much right now -Educated on Cholesterol goals;  Benefits of statin for ASCVD risk reduction; Importance of limiting foods high in cholesterol; Exercise goal of 150 minutes per week; -Counseled on diet and exercise extensively Assessed patient finances. Applied for [employer] and sent information to pharmacy.  CAD (Goal: prevent heart events) -Controlled -Current treatment  Metoprolol succinate 100 mg 1 tablet daily - Appropriate, Effective, Safe, Accessible Rosuvastatin 20 mg 1 tablet daily -  Appropriate, Effective, Safe, Accessible Aspirin 81 mg 1 tablet daily - Appropriate, Effective, Safe, Accessible Nitroglycerin 0.4 mg 1 tablet as needed - Appropriate, Effective, Safe, Accessible -Medications previously tried: none  -Recommended to continue current medication   Diabetes (A1c goal <6.5%) -Controlled -Current medications: No medications -Medications previously tried: n/a  -Current home glucose readings fasting glucose: uses a freestyle libre post prandial glucose: freestyle libre -Denies hypoglycemic/hyperglycemic symptoms -Current meal patterns:  breakfast: n/a   lunch: n/a  dinner: n/a snacks: n/a drinks: water and 2 sodas a week -Current exercise: not much right now -Educated on A1c and blood sugar goals; Exercise goal of 150 minutes per week; Counseled to check feet daily and get yearly eye exams -Counseled to check feet daily and get yearly eye exams -Counseled on diet and exercise extensively  COPD (Goal: control symptoms and prevent exacerbations) -Controlled -Current treatment  Trelegy 100-62.5-25 mcg/inh inhale 1 puff daily at bedtime - Appropriate, Effective, Safe, Accessible Albuterol as needed - Appropriate, Effective, Safe, Accessible -Medications previously tried: unknown  -Gold Grade: Gold 1 (FEV1>80%) -Current COPD Classification:  A (low sx, <2 exacerbations/yr) -MMRC/CAT score: n/a -Pulmonary function testing: 05/04/21 -Exacerbations requiring treatment in last 6 months: none -Patient reports consistent use of maintenance inhaler -Frequency of rescue inhaler use: depends on weather (twice a week usually) -Counseled on Proper inhaler technique; Benefits of consistent maintenance inhaler use When to use rescue inhaler -Counseled on diet and exercise extensively Recommended to continue current medication Assessed patient finances. Patient likely won't qualify for Trelegy PAP due to living with daughter.  Allergic rhinitis (Goal: minimize symptoms) -Controlled -Current treatment  Montelukast 10 mg 1 tablet at bedtime  -Appropriate, Effective, Safe, Accessible Fluticasone 50 mcg/act as needed - Appropriate, Effective, Safe, Accessible -Medications previously tried: none  -Recommended to continue current medication  Tobacco use (Goal quit smoking) -Controlled -Previous quit attempts: n/a -Current treatment  Nicotine 14 mg patches apply once daily - Appropriate, Effective, Safe, Accessible -Patient smokes After 30 minutes of waking -Patient triggers include: anxiety -On a  scale of 1-10, reports MOTIVATION to quit is  10 -On a scale of 1-10, reports CONFIDENCE in quitting is 10 -Provided contact information for [place] [facility]uit [facility] (1-800-QUIT-NOW) and encouraged patient to reach out to this group for support. -Recommended stepping down to 7 mg for nicotine patches and to not stop after the 14 mg dose.  GERD (Goal: minimize symptoms of heartburn) -Controlled -Current treatment  Famotidine 20 mg 1 tablet twice daily - Appropriate, Effective, Query Safe, Accessible Pantoprazole 40 mg 1 tablet daily - Appropriate, Effective, Safe, Accessible -Medications previously tried: none  -Counseled on non-pharmacologic management of symptoms such as elevating the head of your bed, avoiding eating 2-3 hours before bed, avoiding triggering foods such as acidic, spicy, or fatty foods, eating smaller meals, and wearing clothes that are loose around the waist Recommended reducing famotidine to daily due to kidney function.  Neuropathy (Goal: minimize symptoms) -Not ideally controlled -Current treatment  Gabapentin 300 mg 2 capsules twice daily - Appropriate, Effective, Safe, Accessible -Medications previously tried: none  -Recommended moving second dose of gabapentin to bedtime as this can cause some daytime fatigue.  Overactive bladder (Goal: minimize symptoms) -Controlled -Current treatment  Myrbetriq 25 mg 1 tablet daily - not taking due to cost -Medications previously tried: tolterodine (side effects) -Recommended reaching out to urology for samples or an alternative. Assessed patient finances. Patient would not qualify for PAP as she has insurance coverage.  Health Maintenance -Vaccine gaps: shingrix, COVID vaccine (she is getting the booster) -Current therapy:  Miralax as needed every other day Acetaminophen 500 mg as needed -Educated on Cost vs benefit of each product must be carefully weighed by individual consumer -Patient is satisfied with current therapy and denies issues -Recommended to continue current  medication  Patient Goals/Self-Care Activities Patient will:  - take medications as prescribed as evidenced by patient report and record review focus on medication adherence by using adherence packaging check blood pressure weekly, document, and provide at future appointments target a minimum of 150 minutes of moderate intensity exercise weekly  Follow Up Plan: Telephone follow up appointment with care management team member scheduled for: 4 months      Medication Assistance:  Patient does not qualify for patient assistance as she lives with daughter.  Compliance/Adherence/Medication fill history: Care Gaps: COVID booster, shingrix Last BP - 134/76 on 09/07/2021 Last HGA1C was 5.9 on 04/17/2021  Star-Rating Drugs: Losartan 100 mg - last filled 08/31/2021 30 DS at [facility] Rosuvastatin 20 mg - last filled 08/31/2021 30 DS at [facility]  Patient's preferred pharmacy is:  [facility] [place] [id number] [place] [address], [place] [address] Phone: [phone] Fax: [facility] #[id number] [place], [place] [address] [place] [address] Phone: [phone] Fax: [phone]  [id number] [place], [place]: [phone]: [phone] [facility] [place] [place]: [phone]: [phone]  Uses pill box? Yes - 7 days a week - morning noon and night Pt endorses 95% compliance - misses midday  We discussed: Benefits of medication synchronization, packaging and delivery as well as enhanced pharmacist oversight with [facility]. Patient decided to: Utilize [facility] for medication synchronization, packaging  and  delivery  Care Plan and Follow Up Patient Decision:  Patient agrees to Care Plan and Follow-up.  Plan: Telephone follow up appointment with care management team member scheduled for:  4 months  [name], PharmD, [place] at [place] [phone]

## 2021-09-21 NOTE — Telephone Encounter (Signed)
°*  STAT* If patient is at the pharmacy, call can be transferred to refill team.   1. Which medications need to be refilled? (please list name of each medication and dose if known)  Alirocumab (PRALUENT) 75 MG/ML SOAJ  2. Which pharmacy/location (including street and city if local pharmacy) is medication to be sent to? [facility] - [place], [place] - [place] [address]  3. Do they need a 30 day or 90 day supply?   30 day supply

## 2021-09-21 NOTE — Telephone Encounter (Signed)
Refill sent in

## 2021-09-21 NOTE — Chronic Care Management (AMB) (Addendum)
Chronic Care Management Pharmacy Assistant   Name: [name]  MRN: [id number] DOB: 04/30/1946  Reason for Encounter: Medication Review / Medication Coordination Call Call to Dr. Harrington Challenger to send new script for Praluent to [facility].    Conditions to be addressed/monitored: HLD  Recent office visits:  None  Recent consult visits:  None  Hospital visits:  None  Medications: Outpatient Encounter Medications as of 09/21/2021  Medication Sig Note   acetaminophen (TYLENOL) 500 MG tablet Take 1,000 mg by mouth as needed for moderate pain or headache.  05/19/2021: prn   albuterol (VENTOLIN HFA) 108 (90 Base) MCG/ACT inhaler INHALE ONE OR TWO PUFFS BY MOUTH INTO THE LUNGS EVERY SIX HOURS AS NEEDED FOR WHEEZING OR SHORTNESS OF BREATH    Alirocumab (PRALUENT) 75 MG/ML SOAJ Inject 75 mg into the skin every 14 (fourteen) days. (Patient not taking: Reported on 09/21/2021)    aspirin 81 MG chewable tablet Chew 81 mg by mouth daily.    Continuous Blood Gluc Receiver (FREESTYLE LIBRE 2 READER) DEVI Use to check blood sugar, dx:e11.9    Continuous Blood Gluc Sensor (FREESTYLE LIBRE 2 SENSOR) MISC Use to check blood sugar, dx:e11.9    cyclobenzaprine (FLEXERIL) 10 MG tablet Take 1 tablet (10 mg total) by mouth 2 (two) times daily as needed for muscle spasms.    ezetimibe (ZETIA) 10 MG tablet Take 1 tablet (10 mg total) by mouth daily.    famotidine (PEPCID) 20 MG tablet Take 1 tablet (20 mg total) by mouth 2 (two) times daily.    fluticasone (FLONASE) 50 MCG/ACT nasal spray PLACE 2 SPRAYS INTO BOTH NOSTRILS AT BEDTIME AS NEEDED FOR ALLERGIES.    Fluticasone-Umeclidin-Vilant (TRELEGY ELLIPTA) 100-62.5-25 MCG/INH AEPB Inhale 1 puff into the lungs daily.    gabapentin (NEURONTIN) 300 MG capsule Take 2 capsules (600 mg total) by mouth 2 (two) times daily.    losartan (COZAAR) 100 MG tablet Take 1 tablet (100 mg total) by mouth daily.    metoprolol succinate (TOPROL-XL) 100 MG 24 hr tablet Take 1 tablet  (100 mg total) by mouth daily. Take with or immediately following a meal.    montelukast (SINGULAIR) 10 MG tablet TAKE ONE TABLET BY MOUTH EVERYDAY AT BEDTIME    MYRBETRIQ 25 MG TB24 tablet Take 25 mg by mouth daily. 05/19/2021: Patient was taking but in the donut hole with insurance,too $ high   nitroGLYCERIN (NITROSTAT) 0.4 MG SL tablet Place 1 tablet (0.4 mg total) under the tongue every 5 (five) minutes as needed for chest pain.    ondansetron (ZOFRAN-ODT) 4 MG disintegrating tablet 4mg  ODT q4 hours prn nausea/vomit    pantoprazole (PROTONIX) 40 MG tablet Take 1 tablet (40 mg total) by mouth daily.    polyethylene glycol powder (GLYCOLAX/MIRALAX) powder Take 17 g by mouth daily.    rosuvastatin (CRESTOR) 20 MG tablet Take 1 tablet (20 mg total) by mouth daily.    No facility-administered encounter medications on file as of 09/21/2021.   Reviewed chart for medication changes ahead of medication coordination call.  No OVs, Consults, or hospital visits since last care coordination call/Pharmacist visit. (If appropriate, list visit date, provider name)  No medication changes indicated OR if recent visit, treatment plan here.  BP Readings from Last 3 Encounters:  09/07/21 134/76  08/27/21 122/62  08/19/21 134/80    Lab Results  Component Value Date   HGBA1C 5.9 04/17/2021     Patient obtains medications through Adherence Packaging  30 Days  Last adherence delivery included:  Rosuvastatin 20 mg at bedtime Losartan 100 mg at breakfast Metoprolol Suc 100 mg at bedtime Gabapentin 300 mg 2 pills twice daily Montelukast 10 mg at bedtime Famotidine 20 mg twice daily   Patient declined the following medications:   Ezetimibe, patient states she has plenty on hand.  Triamterene HCTZ, patient states she was advised by Dr. [name] to stop this medication for a while due to recent lab results.    Patient is due for next adherence delivery on: 10/06/2021   Called patient and reviewed  medications and coordinated delivery.   This delivery to include: Rosuvastatin 20 mg - take one tablet at bedtime Losartan 100 mg - take one tablet at breakfast Metoprolol Suc 100 mg - take one tablet at bedtime Gabapentin 300 mg - take 2 tablets at breakfast and 2 tablets at dinner Montelukast 10 mg - take one tablet at bedtime Famotidine 20 mg - take one tablet at breakfast and one tablet at bedtime Ezetimibe 10 mg  - take one tablet daily Praluent Inj 75 mg - inject 75 mg every 14 days   Patient will need a short fill: No short fills needed   Coordinated acute fill: No acute fills needed   Patient declined the following medications:  No medications declined.   Confirmed delivery date of 10/06/2021, advised patient that pharmacy will contact them the morning of delivery.     Care Gaps: AWV - completed 08/27/2021 Last BP - 134/76 on 09/07/2021 Last HGA1C was 5.9 on 04/17/2021 Covid - overdue   Star Rating Drug: Losartan 100 mg - last filled 08/31/2021 30 DS at [facility] Rosuvastatin 20 mg - last filled 08/31/2021 30 DS at [facility] [phone]

## 2021-09-22 ENCOUNTER — Ambulatory Visit: Payer: Medicare HMO

## 2021-09-22 ENCOUNTER — Other Ambulatory Visit: Payer: Self-pay

## 2021-09-22 DIAGNOSIS — M25512 Pain in left shoulder: Secondary | ICD-10-CM | POA: Diagnosis not present

## 2021-09-22 DIAGNOSIS — M436 Torticollis: Secondary | ICD-10-CM | POA: Diagnosis not present

## 2021-09-22 DIAGNOSIS — R252 Cramp and spasm: Secondary | ICD-10-CM

## 2021-09-22 DIAGNOSIS — M542 Cervicalgia: Secondary | ICD-10-CM

## 2021-09-22 DIAGNOSIS — M25612 Stiffness of left shoulder, not elsewhere classified: Secondary | ICD-10-CM

## 2021-09-22 DIAGNOSIS — G8929 Other chronic pain: Secondary | ICD-10-CM | POA: Diagnosis not present

## 2021-09-22 DIAGNOSIS — R293 Abnormal posture: Secondary | ICD-10-CM | POA: Diagnosis not present

## 2021-09-22 NOTE — Therapy (Signed)
[facility] @ [place], [place], [address] Phone: [phone]   Fax:  [phone]  Physical Therapy Treatment  Patient Details  Name: [name] MRN: [id number] Date of Birth: 1946/07/28 Referring Provider (PT): [name], MD   Encounter Date: 09/22/2021   PT End of Session - 09/22/21 1559     Visit Number 3    Date for PT Re-Evaluation 10/23/21    Authorization Type AETNA MEDICARE    PT Start Time 1600    PT Stop Time 1645    PT Time Calculation (min) 45 min    Activity Tolerance Patient tolerated treatment well    Behavior During Therapy [facility] for tasks assessed/performed             Past Medical History:  Diagnosis Date   Allergy    Anxiety    C. difficile diarrhea    history of   CAD (coronary artery disease), native coronary artery    a. 05/2017 showed 100% mLCx tx with DES, otherwise 20% mRCA, 50% ramus, 30% dLAD, 20% prox-mid LAD, 20% mLAD treated medically.    Cancer [facility])    Right kidney CA removed.    CKD (chronic kidney disease) stage 3, GFR 30-59 ml/min ([place]) 10/11/2016   s/p R nephrectomy   Colon polyps 2008   HYPERPLASTIC   Diabetes mellitus without complication ([place])    type 2  no meds   Gastritis    GERD (gastroesophageal reflux disease)    History of echocardiogram    Echo 3/18:  Moderate LVH, EF 72-09, grade 1 diastolic dysfunction, calcified aortic valve, mild MR, moderate LAE   History of nuclear stress test    Myoview 3/18: Mod size and intensity fixed septal defect, may be artifact.Opposite mod size and intensity lat defect, which is reversible and could represent ischemia or possibly artifact (SDS 4). LVEF 71% with normal wall motion. Intermediate risk study. >> images reviewed with Dr. Dorris Carnes - no sig ischemia; med rx    Hx of cardiovascular stress test    Lexiscan Myoview 6/16:  EF 70%, no scar or ischemia; Low Risk   Hyperlipidemia    Hypertension    Neuropathy     Orthostatic hypotension 05/28/2017   OSA (obstructive sleep apnea) 01/16/2021   Osteoarthritis    Rheumatoid arthritis ([place])    RA (Dr. Ouida Sills) Bilateral hands   S/P angioplasty with stent 05/27/17 to LCX with DES  05/28/2017   Thyroid disease     Past Surgical History:  Procedure Laterality Date   ABDOMINAL HYSTERECTOMY  1978   ANTERIOR CERVICAL DECOMP/DISCECTOMY FUSION N/A 05/29/2018   Procedure: ANTERIOR CERVICAL DECOMPRESSION FUSION - CERVICAL FIVE-CERVICAL SIX - CERVICAL SIX-CERVICAL SEVEN;  Surgeon: Earnie Larsson, MD;  Location: [facility];  Service: Neurosurgery;  Laterality: N/A;   BACK SURGERY     x 2   CARPAL TUNNEL RELEASE Left    COLONOSCOPY W/ BIOPSIES AND POLYPECTOMY     Hx: of   CORONARY STENT INTERVENTION N/A 05/27/2017   Procedure: CORONARY STENT INTERVENTION;  Surgeon: Burnell Blanks, MD;  Location: [facility];  Service: Cardiovascular;  Laterality: N/A;   ESOPHAGOGASTRODUODENOSCOPY     HNP     LEFT HEART CATH AND CORONARY ANGIOGRAPHY N/A 05/27/2017   Procedure: LEFT HEART CATH AND CORONARY ANGIOGRAPHY;  Surgeon: Burnell Blanks, MD;  Location: [facility];  Service: Cardiovascular;  Laterality: N/A;   LUMBAR LAMINECTOMY/DECOMPRESSION MICRODISCECTOMY Left 02/12/2013  Procedure: LUMBAR TWO THREE, LUMBAR THREE FOUR, LUMBAR FOUR FIVE  LAMINECTOMY/DECOMPRESSION MICRODISCECTOMY 3 LEVELS;  Surgeon: Charlie Pitter, MD;  Location: [facility];  Service: Neurosurgery;  Laterality: Left;   NEPHRECTOMY Right 2010   10.rcc cancer   TOTAL KNEE ARTHROPLASTY Right    Redo    There were no vitals filed for this visit.   Subjective Assessment - 09/22/21 1602     Subjective Patient states she is doing good.  I feel like the pain isnt as bad and as often.  She rates her pain at 6/10    Diagnostic tests IMPRESSION:  1. No acute fracture or malalignment.  2. Anterior fusion D9-M4 appears uncomplicated.  3. Progression of degenerative disc changes at  C4-C5.    Currently in Pain? Yes    Pain Score 7     Pain Location Neck    Pain Orientation Right    Pain Descriptors / Indicators Aching    Pain Type Acute pain    Pain Onset More than a month ago    Pain Frequency Intermittent                               [facility] Adult PT Treatment/Exercise - 09/22/21 0001       Neck Exercises: Machines for Strengthening   UBE (Upper Arm Bike) 5 min (2.5 min fwd and 2.5 min back)      Neck Exercises: Theraband   Shoulder Extension 10 reps;Red    Rows 10 reps;Red    Shoulder External Rotation 10 reps;Red    Horizontal ABduction 10 reps;Red      Shoulder Exercises: Prone   Retraction Strengthening;Both;20 reps;Weights;Other (comment)   Did this in fwd bent position with knee and opposite on edge of table.  2 sets of 10 with 1 lb dumbell   Retraction Weight (lbs) 1    Retraction Limitations fwd bent with knee on table    Extension Strengthening;Both;20 reps;Weights   Did this in fwd bent position with knee and opposite on edge of table.  2 sets of 10 with 1 lb dumbell   Extension Weight (lbs) 1    Extension Limitations fwd bent with knee on table    Horizontal ABduction 1 Strengthening;Both;20 reps;Limitations;Weights    Horizontal ABduction 1 Weight (lbs) 1    Horizontal ABduction 1 Limitations Did this in fwd bent position with knee and opposite on edge of table.  2 sets of 10 with 1 lb dumbell                       PT Short Term Goals - 09/22/21 1701       PT SHORT TERM GOAL #1   Title Independence with initial HEP    Baseline pt requiring cueing for correct form in clinic for most exercises, continues to need mod vc's on 09-22-21    Time 4    Period Weeks    Status On-going    Target Date 09/25/21               PT Long Term Goals - 08/28/21 1938       PT LONG TERM GOAL #1   Title be indpendent in advanced HEP    Time 8    Period Weeks    Status New    Target Date 10/23/21      PT LONG  TERM GOAL #2   Title  Patient to have full painfree cervical ROM    Time 8    Period Weeks    Status New    Target Date 10/23/21      PT LONG TERM GOAL #3   Title Patient to be able to resume prior exercise routine at the [facility]    Time 8    Period Weeks    Status New    Target Date 10/23/21      PT LONG TERM GOAL #4   Title Patient t be able to sleep through the night without neck pain waking her.    Time 8    Period Weeks    Status New    Target Date 10/23/21                   Plan - 09/22/21 1654     Clinical Impression Statement 3rd visit.  Patient has not been able to attend as regularly as needed.  She has reported improvement, however.  She was able to tolerate addition of fwd bent scapular stabilization but required several rest breaks due to respiratory issues.  She is on a smoking cessation program and has not smoked in 2 weeks.  She needed heavy verbal cues for correct technique on tband exercises which may indicate limited compliance with HEP.  She was fairly talkative today which impeded ability to add more exercises.  However, she also needed multiple rest breaks so the talking may not have been as impeding as her respiratory limitations.  Significant shoulder weakness noted today during standing horizontal abduction, therefore scapular stabilization was added today.    Personal Factors and Comorbidities Fitness;Comorbidity 1    Examination-Activity Limitations Dressing;Bathing;Bed Mobility;Sleep;Reach Overhead    Examination-Participation Restrictions Laundry;Cleaning;Driving    Stability/Clinical Decision Making Stable/Uncomplicated    Clinical Decision Making Low    Rehab Potential Good    PT Frequency 2x / week    PT Duration 8 weeks    PT Treatment/Interventions ADLs/Self Care Home Management;Aquatic Therapy;Traction;Moist Heat;Iontophoresis 4mg /ml Dexamethasone;Electrical Stimulation;Cryotherapy;Ultrasound;Gait training;Therapeutic exercise;Therapeutic  activities;Functional mobility training;Stair training;Neuromuscular re-education;Patient/family education;Manual techniques;Passive range of motion;Taping;Dry needling;Spinal Manipulations;Joint Manipulations    PT Next Visit Plan Patient responded well to DN and would benefit again when scheduled with certified PT.   We will focus on scapular and postural strength for next 2 visits.    PT Home Exercise Plan Access Code: [email or web address]             Patient will benefit from skilled therapeutic intervention in order to improve the following deficits and impairments:  Pain, Postural dysfunction, Impaired UE functional use, Increased fascial restricitons, Decreased strength, Decreased range of motion  Visit Diagnosis: Stiffness of neck  Cramp and spasm  Chronic left shoulder pain  Cervicalgia  Stiffness of left shoulder, not elsewhere classified  Abnormal posture     Problem List Patient Active Problem List   Diagnosis Date Noted   OSA (obstructive sleep apnea) 01/16/2021   COPD mixed type ([place]) 01/16/2021   Fatigue 11/03/2020   B12 deficiency 11/03/2020   Chest pain 08/30/2020   Malignant neoplasm of kidney excluding renal pelvis, unspecified laterality ([place]) 09/26/2019   Vitamin D deficiency, unspecified 09/26/2019   Anxiety disorder, unspecified 03/26/2019   Tobacco use disorder 09/29/2018   Cervical myelopathy ([place]) 05/29/2018   ANA positive 12/08/2017   Cervicalgia 09/16/2017   Carpal tunnel syndrome of left wrist 07/21/2017   Chondrocalcinosis 07/21/2017   Neuropathy 07/21/2017   Orthostatic hypotension 05/28/2017   S/P angioplasty with stent  05/27/17 to LCX with DES  05/28/2017   CAD (coronary artery disease)    Polyneuropathy 05/25/2017   Chest pain with moderate risk for cardiac etiology 05/25/2017   Class 1 obesity with serious comorbidity and body mass index (BMI) of 34.0 to 34.9 in adult 12/06/2016   CKD (chronic kidney disease) stage 3, GFR 30-59 ml/min ([facility])  10/11/2016   Hot flashes, menopausal 10/01/2016   Chest pain with moderate risk of acute coronary syndrome 10/25/2014   Degenerative spondylolisthesis 10/15/2014   Spinal stenosis, lumbar region, with neurogenic claudication 02/12/2013   DM (diabetes mellitus), type 2 with renal complications ([place]) 73/40/3709   History of Rtr nephrectomy 2010, secondary to renal cell cancer 01/14/2009   PERSONAL HX COLONIC POLYPS 11/14/2008   Hyperlipidemia associated with type 2 diabetes mellitus ([place]) 11/16/2007   ESOPHAGEAL STRICTURE 10/12/2007   Rheumatoid arthritis ([place]) 02/27/2007   Hypertension with heart disease 01/19/2007   Allergic rhinitis 01/19/2007   GERD 01/19/2007    [place] B. Gracyn Santillanes, PT 02/21/235:02 PM   [facility] @ [place], [place], [address] Phone: [phone]   Fax:  [phone]  Name: [name] MRN: [id number] Date of Birth: 06-Oct-1945

## 2021-09-23 ENCOUNTER — Other Ambulatory Visit: Payer: Self-pay | Admitting: Family Medicine

## 2021-09-24 ENCOUNTER — Ambulatory Visit: Payer: Medicare HMO

## 2021-09-24 ENCOUNTER — Other Ambulatory Visit: Payer: Self-pay

## 2021-09-24 DIAGNOSIS — G8929 Other chronic pain: Secondary | ICD-10-CM | POA: Diagnosis not present

## 2021-09-24 DIAGNOSIS — M436 Torticollis: Secondary | ICD-10-CM | POA: Diagnosis not present

## 2021-09-24 DIAGNOSIS — M542 Cervicalgia: Secondary | ICD-10-CM | POA: Diagnosis not present

## 2021-09-24 DIAGNOSIS — R293 Abnormal posture: Secondary | ICD-10-CM

## 2021-09-24 DIAGNOSIS — M25512 Pain in left shoulder: Secondary | ICD-10-CM | POA: Diagnosis not present

## 2021-09-24 DIAGNOSIS — R252 Cramp and spasm: Secondary | ICD-10-CM | POA: Diagnosis not present

## 2021-09-24 DIAGNOSIS — M25612 Stiffness of left shoulder, not elsewhere classified: Secondary | ICD-10-CM | POA: Diagnosis not present

## 2021-09-24 NOTE — Therapy (Signed)
[facility] @ [place], [place], [address] Phone: [phone]   Fax:  [phone]  Physical Therapy Treatment  Patient Details  Name: [name] MRN: [id number] Date of Birth: 07/12/1946 Referring Provider ([name]): [name], MD   Encounter Date: 09/24/2021   [name] End of Session - 09/24/21 1457     Visit Number 4    Date for [name] Re-Evaluation 10/23/21    Authorization Type AETNA MEDICARE    [name] Start Time 1449    [name] Stop Time 2376    [name] Time Calculation (min) 41 min    Activity Tolerance Patient tolerated treatment well    Behavior During Therapy [facility] for tasks assessed/performed             Past Medical History:  Diagnosis Date   Allergy    Anxiety    C. difficile diarrhea    history of   CAD (coronary artery disease), native coronary artery    a. 05/2017 showed 100% mLCx tx with DES, otherwise 20% mRCA, 50% ramus, 30% dLAD, 20% prox-mid LAD, 20% mLAD treated medically.    Cancer [facility])    Right kidney CA removed.    CKD (chronic kidney disease) stage 3, GFR 30-59 ml/min ([place]) 10/11/2016   s/p R nephrectomy   Colon polyps 2008   HYPERPLASTIC   Diabetes mellitus without complication ([place])    type 2  no meds   Gastritis    GERD (gastroesophageal reflux disease)    History of echocardiogram    Echo 3/18:  Moderate LVH, EF 28-31, grade 1 diastolic dysfunction, calcified aortic valve, mild MR, moderate LAE   History of nuclear stress test    Myoview 3/18: Mod size and intensity fixed septal defect, may be artifact.Opposite mod size and intensity lat defect, which is reversible and could represent ischemia or possibly artifact (SDS 4). LVEF 71% with normal wall motion. Intermediate risk study. >> images reviewed with Dr. Dorris Carnes - no sig ischemia; med rx    Hx of cardiovascular stress test    Lexiscan Myoview 6/16:  EF 70%, no scar or ischemia; Low Risk   Hyperlipidemia    Hypertension    Neuropathy     Orthostatic hypotension 05/28/2017   OSA (obstructive sleep apnea) 01/16/2021   Osteoarthritis    Rheumatoid arthritis ([place])    RA (Dr. Ouida Sills) Bilateral hands   S/P angioplasty with stent 05/27/17 to LCX with DES  05/28/2017   Thyroid disease     Past Surgical History:  Procedure Laterality Date   ABDOMINAL HYSTERECTOMY  1978   ANTERIOR CERVICAL DECOMP/DISCECTOMY FUSION N/A 05/29/2018   Procedure: ANTERIOR CERVICAL DECOMPRESSION FUSION - CERVICAL FIVE-CERVICAL SIX - CERVICAL SIX-CERVICAL SEVEN;  Surgeon: Earnie Larsson, MD;  Location: [facility];  Service: Neurosurgery;  Laterality: N/A;   BACK SURGERY     x 2   CARPAL TUNNEL RELEASE Left    COLONOSCOPY W/ BIOPSIES AND POLYPECTOMY     Hx: of   CORONARY STENT INTERVENTION N/A 05/27/2017   Procedure: CORONARY STENT INTERVENTION;  Surgeon: Burnell Blanks, MD;  Location: [facility];  Service: Cardiovascular;  Laterality: N/A;   ESOPHAGOGASTRODUODENOSCOPY     HNP     LEFT HEART CATH AND CORONARY ANGIOGRAPHY N/A 05/27/2017   Procedure: LEFT HEART CATH AND CORONARY ANGIOGRAPHY;  Surgeon: Burnell Blanks, MD;  Location: [facility];  Service: Cardiovascular;  Laterality: N/A;   LUMBAR LAMINECTOMY/DECOMPRESSION MICRODISCECTOMY Left 02/12/2013  Procedure: LUMBAR TWO THREE, LUMBAR THREE FOUR, LUMBAR FOUR FIVE  LAMINECTOMY/DECOMPRESSION MICRODISCECTOMY 3 LEVELS;  Surgeon: Charlie Pitter, MD;  Location: [facility];  Service: Neurosurgery;  Laterality: Left;   NEPHRECTOMY Right 2010   10.rcc cancer   TOTAL KNEE ARTHROPLASTY Right    Redo    There were no vitals filed for this visit.   Subjective Assessment - 09/24/21 1458     Subjective Patient states, "not too good today".  She explains that her allergies are bothering her and her back is hurting.  She rates her neck and shoulder pain at 3/10.    Diagnostic tests IMPRESSION:  1. No acute fracture or malalignment.  2. Anterior fusion N9-G9 appears uncomplicated.   3. Progression of degenerative disc changes at C4-C5.    Currently in Pain? Yes    Pain Score 3     Pain Location Neck    Pain Descriptors / Indicators Discomfort    Pain Onset More than a month ago                               [facility] Adult [name] Treatment/Exercise - 09/24/21 0001       Neck Exercises: Machines for Strengthening   UBE (Upper Arm Bike) 5 min (2.5 min fwd and 2.5 min back)      Neck Exercises: Theraband   Shoulder Extension 10 reps;Red    Rows 10 reps;Red    Shoulder External Rotation 10 reps;Red    Horizontal ABduction 10 reps;Red      Neck Exercises: Stretches   Corner Stretch 5 reps;10 seconds      Shoulder Exercises: Prone   Retraction Strengthening;Both;20 reps;Weights;Other (comment)   Did this in fwd bent position with knee and opposite on edge of table.  2 sets of 10 with 1 lb dumbell   Retraction Weight (lbs) 1    Retraction Limitations fwd bent with knee on table    Extension Strengthening;Both;20 reps;Weights   Did this in fwd bent position with knee and opposite on edge of table.  2 sets of 10 with 1 lb dumbell   Extension Weight (lbs) 1    Extension Limitations fwd bent with knee on table    Horizontal ABduction 1 Strengthening;Both;20 reps;Limitations;Weights    Horizontal ABduction 1 Weight (lbs) 1    Horizontal ABduction 1 Limitations Did this in fwd bent position with knee and opposite on edge of table.  2 sets of 10 with 1 lb dumbell      Manual Therapy   Manual Therapy Soft tissue mobilization;Myofascial release                       [name] Short Term Goals - 09/24/21 1540       [name] SHORT TERM GOAL #1   Title Independence with initial HEP    Baseline [name] requiring cueing for correct form in clinic for most exercises, continues to need mod vc's on 09-24-21    Time 4    Period Weeks    Status On-going    Target Date 09/25/21      [name] SHORT TERM GOAL #2   Title report a 30% reduction in Lt shoulder pain with  daily use    Baseline 75%    Time 4    Period Weeks    Status Achieved    Target Date 11/13/19  [name] Long Term Goals - 08/28/21 1938       [name] LONG TERM GOAL #1   Title be indpendent in advanced HEP    Time 8    Period Weeks    Status New    Target Date 10/23/21      [name] LONG TERM GOAL #2   Title Patient to have full painfree cervical ROM    Time 8    Period Weeks    Status New    Target Date 10/23/21      [name] LONG TERM GOAL #3   Title Patient to be able to resume prior exercise routine at the [facility]    Time 8    Period Weeks    Status New    Target Date 10/23/21      [name] LONG TERM GOAL #4   Title Patient t be able to sleep through the night without neck pain waking her.    Time 8    Period Weeks    Status New    Target Date 10/23/21                   Plan - 09/24/21 1536     Clinical Impression Statement [name] is responding appropriately to added scapular stabilization.  She was able to do standing horizontal abduction with less fatigue today and good technique.  She needed decreased rest breaks during shoulder exercises as well.  She is still limited in cervical rotation passively and actively but this is also progressing.  She would benefit from continued skilled [name] for postural strengthening, shoulder stabilization and cervical PROM.    Personal Factors and Comorbidities Fitness;Comorbidity 1    Examination-Activity Limitations Dressing;Bathing;Bed Mobility;Sleep;Reach Overhead    Examination-Participation Restrictions Laundry;Cleaning;Driving    Stability/Clinical Decision Making Stable/Uncomplicated    Clinical Decision Making Low    Rehab Potential Good    [name] Frequency 2x / week    [name] Duration 8 weeks    [name] Treatment/Interventions ADLs/Self Care Home Management;Aquatic Therapy;Traction;Moist Heat;Iontophoresis 4mg /ml Dexamethasone;Electrical Stimulation;Cryotherapy;Ultrasound;Gait training;Therapeutic exercise;Therapeutic  activities;Functional mobility training;Stair training;Neuromuscular re-education;Patient/family education;Manual techniques;Passive range of motion;Taping;Dry needling;Spinal Manipulations;Joint Manipulations    [name] Next Visit Plan Patient responded well to DN and would benefit again when scheduled with certified [name].   We will focus on scapular and postural strength for next 2 visits.    [name] Home Exercise Plan Access Code: [email or web address]    [email or web address] and Agree with Plan of Care Patient             Patient will benefit from skilled therapeutic intervention in order to improve the following deficits and impairments:  Pain, Postural dysfunction, Impaired UE functional use, Increased fascial restricitons, Decreased strength, Decreased range of motion  Visit Diagnosis: Stiffness of neck  Cramp and spasm  Chronic left shoulder pain  Cervicalgia  Stiffness of left shoulder, not elsewhere classified  Abnormal posture     Problem List Patient Active Problem List   Diagnosis Date Noted   OSA (obstructive sleep apnea) 01/16/2021   COPD mixed type ([place]) 01/16/2021   Fatigue 11/03/2020   B12 deficiency 11/03/2020   Chest pain 08/30/2020   Malignant neoplasm of kidney excluding renal pelvis, unspecified laterality ([place]) 09/26/2019   Vitamin D deficiency, unspecified 09/26/2019   Anxiety disorder, unspecified 03/26/2019   Tobacco use disorder 09/29/2018   Cervical myelopathy ([place]) 05/29/2018   ANA positive 12/08/2017   Cervicalgia 09/16/2017   Carpal tunnel syndrome of left wrist 07/21/2017   Chondrocalcinosis 07/21/2017  Neuropathy 07/21/2017   Orthostatic hypotension 05/28/2017   S/P angioplasty with stent 05/27/17 to LCX with DES  05/28/2017   CAD (coronary artery disease)    Polyneuropathy 05/25/2017   Chest pain with moderate risk for cardiac etiology 05/25/2017   Class 1 obesity with serious comorbidity and body mass index (BMI) of 34.0 to 34.9 in adult 12/06/2016   CKD (chronic  kidney disease) stage 3, GFR 30-59 ml/min (HCC) 10/11/2016   Hot flashes, menopausal 10/01/2016   Chest pain with moderate risk of acute coronary syndrome 10/25/2014   Degenerative spondylolisthesis 10/15/2014   Spinal stenosis, lumbar region, with neurogenic claudication 02/12/2013   DM (diabetes mellitus), type 2 with renal complications ([place]) 43/32/9518   History of Rtr nephrectomy 2010, secondary to renal cell cancer 01/14/2009   PERSONAL HX COLONIC POLYPS 11/14/2008   Hyperlipidemia associated with type 2 diabetes mellitus ([place]) 11/16/2007   ESOPHAGEAL STRICTURE 10/12/2007   Rheumatoid arthritis ([place]) 02/27/2007   Hypertension with heart disease 01/19/2007   Allergic rhinitis 01/19/2007   GERD 01/19/2007    [name], [name] 02/23/233:41 PM   [name] @ [place], [place], [address] Phone: [phone]   Fax:  [phone]  Name: AYDIN CAVALIERI MRN: [id number] Date of Birth: 1946/04/26

## 2021-09-29 ENCOUNTER — Other Ambulatory Visit: Payer: Self-pay

## 2021-09-29 ENCOUNTER — Ambulatory Visit: Payer: Medicare HMO

## 2021-09-29 DIAGNOSIS — M436 Torticollis: Secondary | ICD-10-CM

## 2021-09-29 DIAGNOSIS — E119 Type 2 diabetes mellitus without complications: Secondary | ICD-10-CM

## 2021-09-29 DIAGNOSIS — R293 Abnormal posture: Secondary | ICD-10-CM

## 2021-09-29 DIAGNOSIS — M542 Cervicalgia: Secondary | ICD-10-CM

## 2021-09-29 DIAGNOSIS — N183 Chronic kidney disease, stage 3 unspecified: Secondary | ICD-10-CM | POA: Diagnosis not present

## 2021-09-29 DIAGNOSIS — E785 Hyperlipidemia, unspecified: Secondary | ICD-10-CM

## 2021-09-29 DIAGNOSIS — M25612 Stiffness of left shoulder, not elsewhere classified: Secondary | ICD-10-CM

## 2021-09-29 DIAGNOSIS — M25512 Pain in left shoulder: Secondary | ICD-10-CM | POA: Diagnosis not present

## 2021-09-29 DIAGNOSIS — G8929 Other chronic pain: Secondary | ICD-10-CM | POA: Diagnosis not present

## 2021-09-29 DIAGNOSIS — R252 Cramp and spasm: Secondary | ICD-10-CM

## 2021-09-29 NOTE — Therapy (Signed)
[facility] @ [place], [place], [address] Phone: [phone]   Fax:  [phone]  Physical Therapy Treatment  Patient Details  Name: [name] MRN: [id number] Date of Birth: 1946-02-06 Referring Provider (PT): [name], MD   Encounter Date: 09/29/2021   PT End of Session - 09/29/21 1455     Visit Number 5    Date for PT Re-Evaluation 10/23/21    Authorization Type AETNA MEDICARE    PT Start Time 1448    PT Stop Time 1062    PT Time Calculation (min) 42 min    Activity Tolerance Patient tolerated treatment well    Behavior During Therapy [facility] for tasks assessed/performed             Past Medical History:  Diagnosis Date   Allergy    Anxiety    C. difficile diarrhea    history of   CAD (coronary artery disease), native coronary artery    a. 05/2017 showed 100% mLCx tx with DES, otherwise 20% mRCA, 50% ramus, 30% dLAD, 20% prox-mid LAD, 20% mLAD treated medically.    Cancer [facility])    Right kidney CA removed.    CKD (chronic kidney disease) stage 3, GFR 30-59 ml/min ([place]) 10/11/2016   s/p R nephrectomy   Colon polyps 2008   HYPERPLASTIC   Diabetes mellitus without complication ([place])    type 2  no meds   Gastritis    GERD (gastroesophageal reflux disease)    History of echocardiogram    Echo 3/18:  Moderate LVH, EF 69-48, grade 1 diastolic dysfunction, calcified aortic valve, mild MR, moderate LAE   History of nuclear stress test    Myoview 3/18: Mod size and intensity fixed septal defect, may be artifact.Opposite mod size and intensity lat defect, which is reversible and could represent ischemia or possibly artifact (SDS 4). LVEF 71% with normal wall motion. Intermediate risk study. >> images reviewed with Dr. Dorris Carnes - no sig ischemia; med rx    Hx of cardiovascular stress test    Lexiscan Myoview 6/16:  EF 70%, no scar or ischemia; Low Risk   Hyperlipidemia    Hypertension    Neuropathy     Orthostatic hypotension 05/28/2017   OSA (obstructive sleep apnea) 01/16/2021   Osteoarthritis    Rheumatoid arthritis ([place])    RA (Dr. Ouida Sills) Bilateral hands   S/P angioplasty with stent 05/27/17 to LCX with DES  05/28/2017   Thyroid disease     Past Surgical History:  Procedure Laterality Date   ABDOMINAL HYSTERECTOMY  1978   ANTERIOR CERVICAL DECOMP/DISCECTOMY FUSION N/A 05/29/2018   Procedure: ANTERIOR CERVICAL DECOMPRESSION FUSION - CERVICAL FIVE-CERVICAL SIX - CERVICAL SIX-CERVICAL SEVEN;  Surgeon: Earnie Larsson, MD;  Location: [facility];  Service: Neurosurgery;  Laterality: N/A;   BACK SURGERY     x 2   CARPAL TUNNEL RELEASE Left    COLONOSCOPY W/ BIOPSIES AND POLYPECTOMY     Hx: of   CORONARY STENT INTERVENTION N/A 05/27/2017   Procedure: CORONARY STENT INTERVENTION;  Surgeon: Burnell Blanks, MD;  Location: [facility];  Service: Cardiovascular;  Laterality: N/A;   ESOPHAGOGASTRODUODENOSCOPY     HNP     LEFT HEART CATH AND CORONARY ANGIOGRAPHY N/A 05/27/2017   Procedure: LEFT HEART CATH AND CORONARY ANGIOGRAPHY;  Surgeon: Burnell Blanks, MD;  Location: [facility];  Service: Cardiovascular;  Laterality: N/A;   LUMBAR LAMINECTOMY/DECOMPRESSION MICRODISCECTOMY Left 02/12/2013  Procedure: LUMBAR TWO THREE, LUMBAR THREE FOUR, LUMBAR FOUR FIVE  LAMINECTOMY/DECOMPRESSION MICRODISCECTOMY 3 LEVELS;  Surgeon: Charlie Pitter, MD;  Location: [facility];  Service: Neurosurgery;  Laterality: Left;   NEPHRECTOMY Right 2010   10.rcc cancer   TOTAL KNEE ARTHROPLASTY Right    Redo    There were no vitals filed for this visit.   Subjective Assessment - 09/29/21 1458     Subjective Patient states she is doing better today.  She apologizes for being a little late.    Diagnostic tests IMPRESSION:  1. No acute fracture or malalignment.  2. Anterior fusion K5-L9 appears uncomplicated.  3. Progression of degenerative disc changes at C4-C5.    Currently in Pain?  Yes    Pain Score 3     Pain Location Neck    Pain Descriptors / Indicators Aching    Pain Type Acute pain    Pain Onset More than a month ago    Pain Frequency Intermittent                               [facility] Adult PT Treatment/Exercise - 09/29/21 0001       Neck Exercises: Machines for Strengthening   UBE (Upper Arm Bike) 5 min (2.5 min fwd and 2.5 min back)      Neck Exercises: Theraband   Shoulder Extension Red;20 reps    Rows Red;20 reps    Horizontal ABduction 20 reps;Red      Shoulder Exercises: Prone   Retraction Strengthening;Both;20 reps;Weights;Other (comment)   Did this in fwd bent position with knee and opposite on edge of table.  2 sets of 10 with 1 lb dumbell   Retraction Weight (lbs) 2    Retraction Limitations fwd bent with knee on table    Extension Strengthening;Both;20 reps;Weights   Did this in fwd bent position with knee and opposite on edge of table.  2 sets of 10 with 1 lb dumbell   Extension Weight (lbs) 2    Extension Limitations fwd bent with knee on table    Horizontal ABduction 1 Strengthening;Both;20 reps;Limitations;Weights    Horizontal ABduction 1 Weight (lbs) 2    Horizontal ABduction 1 Limitations Did this in fwd bent position with knee and opposite on edge of table.  2 sets of 10 with 1 lb dumbell                       PT Short Term Goals - 09/24/21 1540       PT SHORT TERM GOAL #1   Title Independence with initial HEP    Baseline pt requiring cueing for correct form in clinic for most exercises, continues to need mod vc's on 09-24-21    Time 4    Period Weeks    Status On-going    Target Date 09/25/21      PT SHORT TERM GOAL #2   Title report a 30% reduction in Lt shoulder pain with daily use    Baseline 75%    Time 4    Period Weeks    Status Achieved    Target Date 11/13/19               PT Long Term Goals - 08/28/21 1938       PT LONG TERM GOAL #1   Title be indpendent in advanced  HEP    Time 8  Period Weeks    Status New    Target Date 10/23/21      PT LONG TERM GOAL #2   Title Patient to have full painfree cervical ROM    Time 8    Period Weeks    Status New    Target Date 10/23/21      PT LONG TERM GOAL #3   Title Patient to be able to resume prior exercise routine at the [facility]    Time 8    Period Weeks    Status New    Target Date 10/23/21      PT LONG TERM GOAL #4   Title Patient t be able to sleep through the night without neck pain waking her.    Time 8    Period Weeks    Status New    Target Date 10/23/21                   Plan - 09/29/21 1722     Clinical Impression Statement [name] is progressing but is extremely weak in postural and bilateral shoulder musculature.  She fatigues easily and needs rest breaks and is talkative, therefore treatment is limited.  She is very willing and motivated but is distracted easily.  She would benefit from continued skilled PT for upper body strength to reduce cervical pain.    Personal Factors and Comorbidities Fitness;Comorbidity 1    Examination-Activity Limitations Dressing;Bathing;Bed Mobility;Sleep;Reach Overhead    Examination-Participation Restrictions Laundry;Cleaning;Driving    Stability/Clinical Decision Making Stable/Uncomplicated    Clinical Decision Making Low    Rehab Potential Good    PT Frequency 2x / week    PT Duration 8 weeks    PT Treatment/Interventions ADLs/Self Care Home Management;Aquatic Therapy;Traction;Moist Heat;Iontophoresis 4mg /ml Dexamethasone;Electrical Stimulation;Cryotherapy;Ultrasound;Gait training;Therapeutic exercise;Therapeutic activities;Functional mobility training;Stair training;Neuromuscular re-education;Patient/family education;Manual techniques;Passive range of motion;Taping;Dry needling;Spinal Manipulations;Joint Manipulations    PT Next Visit Plan Patient responded well to DN and would benefit again when scheduled with certified PT.   We will focus on  scapular and postural strength for next 2 visits.    PT Home Exercise Plan Access Code: [email or web address]    [email or web address] and Agree with Plan of Care Patient             Patient will benefit from skilled therapeutic intervention in order to improve the following deficits and impairments:  Pain, Postural dysfunction, Impaired UE functional use, Increased fascial restricitons, Decreased strength, Decreased range of motion  Visit Diagnosis: Stiffness of neck  Cramp and spasm  Chronic left shoulder pain  Cervicalgia  Stiffness of left shoulder, not elsewhere classified  Abnormal posture     Problem List Patient Active Problem List   Diagnosis Date Noted   OSA (obstructive sleep apnea) 01/16/2021   COPD mixed type ([place]) 01/16/2021   Fatigue 11/03/2020   B12 deficiency 11/03/2020   Chest pain 08/30/2020   Malignant neoplasm of kidney excluding renal pelvis, unspecified laterality ([place]) 09/26/2019   Vitamin D deficiency, unspecified 09/26/2019   Anxiety disorder, unspecified 03/26/2019   Tobacco use disorder 09/29/2018   Cervical myelopathy ([place]) 05/29/2018   ANA positive 12/08/2017   Cervicalgia 09/16/2017   Carpal tunnel syndrome of left wrist 07/21/2017   Chondrocalcinosis 07/21/2017   Neuropathy 07/21/2017   Orthostatic hypotension 05/28/2017   S/P angioplasty with stent 05/27/17 to LCX with DES  05/28/2017   CAD (coronary artery disease)    Polyneuropathy 05/25/2017   Chest pain with moderate risk for cardiac etiology 05/25/2017   Class 1  obesity with serious comorbidity and body mass index (BMI) of 34.0 to 34.9 in adult 12/06/2016   CKD (chronic kidney disease) stage 3, GFR 30-59 ml/min (HCC) 10/11/2016   Hot flashes, menopausal 10/01/2016   Chest pain with moderate risk of acute coronary syndrome 10/25/2014   Degenerative spondylolisthesis 10/15/2014   Spinal stenosis, lumbar region, with neurogenic claudication 02/12/2013   DM (diabetes mellitus), type 2 with renal  complications ([place]) 25/63/8937   History of Rtr nephrectomy 2010, secondary to renal cell cancer 01/14/2009   PERSONAL HX COLONIC POLYPS 11/14/2008   Hyperlipidemia associated with type 2 diabetes mellitus ([place]) 11/16/2007   ESOPHAGEAL STRICTURE 10/12/2007   Rheumatoid arthritis ([place]) 02/27/2007   Hypertension with heart disease 01/19/2007   Allergic rhinitis 01/19/2007   GERD 01/19/2007    Anderson [place] B. Saffron Busey, PT 02/28/235:28 PM   [facility] @ [place], [place], [address] Phone: [phone]   Fax:  [phone]  Name: [name] MRN: [id number] Date of Birth: 1945/10/27

## 2021-09-30 ENCOUNTER — Telehealth: Payer: Self-pay | Admitting: Family Medicine

## 2021-09-30 DIAGNOSIS — G4733 Obstructive sleep apnea (adult) (pediatric): Secondary | ICD-10-CM | POA: Diagnosis not present

## 2021-09-30 MED ORDER — HYDROCHLOROTHIAZIDE 25 MG PO TABS: 25.0000 mg | ORAL_TABLET | Freq: Every day | ORAL | 3 refills | Status: DC

## 2021-09-30 NOTE — Telephone Encounter (Signed)
You had her stop the [id number]-hctz due to her kidney function.  ?

## 2021-09-30 NOTE — Telephone Encounter (Signed)
HCTZ can be added at 25 mg daily and when due for Losartan refills we can change to combination Losartan-HCTZ 100-25 mg daily. ?No changes in Metoprolol succinate for now. ?Continue monitoring BP's and f/u in 4 weeks. ?Thanks, ?BJ ?

## 2021-09-30 NOTE — Telephone Encounter (Signed)
Pt is calling and her BP yesterday was 148/86 and today BP is 154/86 . Pt was on 2 bp med and now only taking losartan 100 mg. Pt has a slight headache not sure if its due to pollen  Please advise ?

## 2021-09-30 NOTE — Telephone Encounter (Signed)
I called and spoke with patient. Rx has been sent in & she will call back to schedule her follow up visit.  ?

## 2021-10-09 NOTE — Progress Notes (Deleted)
HPI ?F Smoker (1 pk/ week, 15 pkyrs followed for OSA, Bronchiectasis, complicated by  Orthostasis, HTN, CAD/ stent, Allergic Rhinitis, GERD,  ?NPSG 10/07/18- AHI 65.8/ hr, desaturation to 89%, body weight 209 lbs ?HST 02/09/21- AHI 50.5/ hr, desaturation to a nadir of 70%- average 87%, body weight 226 lbs ?PFT 05/04/21- Mild airtrapping but normal flows w/o resp to BD. DLCO is high for volume. ? ?=============================================================== ? ? ?09/07/21-75 yoF Smoker (1 pk/ week, 15 pkyrs followed for OSA, Bronchiectasis, complicated by  Orthostasis, HTN, CAD/ stent, Aortic Atherosclerosis, Allergic Rhinitis, GERD,  ?HST 02/09/21- AHI 50.5/ hr, desaturation to a nadir of 70%- average 87%, body weight 226 lbs ?Son is [name] ?-Albuterol hfa, Singulair, Flonase ?CPAP auto 4-10/ [facility] ?Body weight today-224 lbs                           daughter here ?Covid vax-1 Phizer ?Flu vax-had ?ED visit for viral syndrome 12/31- given amox to hold. ?Not wearing CPAP- can't tolerate the mask. ?We put her in touch with the pharmacy smoking cessation team who told her she did not qualify because she "was not smoking enough". ?We are working with her bronchiectasis.  She may need more attention to pulmonary toilet and may need a maintenance controller inhaler.  For now we are refilling her albuterol rescue inhaler, trying to get her off of cigarettes and updating CXR. ? ?10/12/21- 75 yoF Smoker (1 pk/ week, 15 pkyrs followed for OSA, Bronchiectasis, complicated by  Orthostasis, HTN, CAD/ stent, Aortic Atherosclerosis, Allergic Rhinitis, GERD,  ?Son is [name] ?-Ventolin hfa, Singulair, Trelegy 100, Flonase,  ?CPAP auto 4-10/ [facility] ?Intolerant of CPAP mask- referred for mask fitting by [facility] D[facility] on 2/17 ?Download- ?Body weight today- ?Covid vax- ?Flu vax- ? ?CXR 09/08/21- ?IMPRESSION: ?There are no new infiltrates or signs of pulmonary edema. ? ?ROS-see HPI   + = positive ?Constitutional:    weight loss,  night sweats, fevers, chills, fatigue, lassitude. ?HEENT:    headaches, +difficulty swallowing, tooth/dental problems, sore throat,  ?     sneezing, itching, ear ache, +nasal congestion, post nasal drip, snoring ?CV:    chest pain, orthopnea, PND, swelling in lower extremities, anasarca,      dizziness, palpitations ?Resp:   +shortness of breath with exertion or at rest.   ?             +productive cough,   non-productive cough, coughing up of blood.   ?           change in color of mucus.  wheezing.   ?Skin:    rash or lesions. ?GI:  No-   heartburn, indigestion, abdominal pain, nausea, vomiting, diarrhea,  ?               change in bowel habits, loss of appetite ?GU: dysuria, change in color of urine, no urgency or frequency.   flank pain. ?MS:   +joint pain, stiffness, decreased range of motion, back pain. ?Neuro-     +dizzy ?Psych:  change in mood or affect.  depression or anxiety.   memory loss. ? ?OBJ- Physical Exam ?General- Alert, Oriented, Affect-appropriate, Distress- none acute, + obese ?Skin- rash-none, lesions- none, excoriation- none ?Lymphadenopathy- none ?Head- atraumatic ?           Eyes- Gross vision intact, PERRLA, conjunctivae and secretions clear ?           Ears- Hearing, canals-normal ?  Nose- Clear, no-Septal dev, mucus, polyps, erosion, perforation  ?           Throat- Mallampati III, mucosa clear , drainage- none, +tonsils, + teeth ?Neck- flexible , trachea midline, no stridor , thyroid nl, carotid no bruit ?Chest - symmetrical excursion , unlabored ?          Heart/CV- RRR , no murmur , no gallop  , no rub, nl s1 s2 ?                          - JVD- none , edema- none, stasis changes- none, varices- none ?          Lung- +coarse, wheeze- none, cough- none , dullness-none, rub- none ?          Chest wall-  ?Abd-  ?Br/ Gen/ Rectal- Not done, not indicated ?Extrem- cyanosis- none, clubbing, none, atrophy- none, strength- nl ?Neuro- grossly intact to observation ? ? ?  ?

## 2021-10-12 ENCOUNTER — Ambulatory Visit: Payer: Medicare HMO | Admitting: Internal Medicine

## 2021-10-12 DIAGNOSIS — N3941 Urge incontinence: Secondary | ICD-10-CM | POA: Diagnosis not present

## 2021-10-13 ENCOUNTER — Ambulatory Visit: Payer: Medicare HMO | Attending: Family Medicine

## 2021-10-13 ENCOUNTER — Ambulatory Visit: Payer: Medicare HMO

## 2021-10-13 ENCOUNTER — Other Ambulatory Visit: Payer: Self-pay

## 2021-10-13 DIAGNOSIS — R293 Abnormal posture: Secondary | ICD-10-CM | POA: Diagnosis not present

## 2021-10-13 DIAGNOSIS — M25512 Pain in left shoulder: Secondary | ICD-10-CM | POA: Diagnosis not present

## 2021-10-13 DIAGNOSIS — M436 Torticollis: Secondary | ICD-10-CM | POA: Insufficient documentation

## 2021-10-13 DIAGNOSIS — M542 Cervicalgia: Secondary | ICD-10-CM | POA: Insufficient documentation

## 2021-10-13 DIAGNOSIS — G8929 Other chronic pain: Secondary | ICD-10-CM | POA: Diagnosis not present

## 2021-10-13 DIAGNOSIS — R252 Cramp and spasm: Secondary | ICD-10-CM | POA: Diagnosis not present

## 2021-10-13 DIAGNOSIS — M25612 Stiffness of left shoulder, not elsewhere classified: Secondary | ICD-10-CM | POA: Diagnosis not present

## 2021-10-13 NOTE — Patient Instructions (Signed)
Continue current HEP 

## 2021-10-13 NOTE — Therapy (Signed)
[facility] ?[place], [place], [address] ?Phone: [phone]   Fax:  [phone] ? ?Physical Therapy Treatment ? ?Patient Details  ?Name: [name] ?MRN: [id number] ?Date of Birth: 07-23-1946 ?Referring Provider (PT): [name], MD ? ? ?Encounter Date: 10/13/2021 ? ? PT End of Session - 10/13/21 1620   ? ? Visit Number 6   ? Date for PT Re-Evaluation 10/23/21   ? Authorization Type AETNA MEDICARE   ? PT Start Time 6812   ? PT Stop Time 1700   ? PT Time Calculation (min) 43 min   ? Activity Tolerance Patient tolerated treatment well   ? Behavior During Therapy [facility] for tasks assessed/performed   ? ?  ?  ? ?  ? ? ?Past Medical History:  ?Diagnosis Date  ? Allergy   ? Anxiety   ? C. difficile diarrhea   ? history of  ? CAD (coronary artery disease), native coronary artery   ? a. 05/2017 showed 100% mLCx tx with DES, otherwise 20% mRCA, 50% ramus, 30% dLAD, 20% prox-mid LAD, 20% mLAD treated medically.   ? [facility])   ? Right kidney CA removed.   ? CKD (chronic kidney disease) stage 3, GFR 30-59 ml/min ([facility]) 10/11/2016  ? s/p R nephrectomy  ? Colon polyps 2008  ? HYPERPLASTIC  ? Diabetes mellitus without complication ([facility])   ? type 2  no meds  ? Gastritis   ? GERD (gastroesophageal reflux disease)   ? History of echocardiogram   ? Echo 3/18:  Moderate LVH, EF 75-17, grade 1 diastolic dysfunction, calcified aortic valve, mild MR, moderate LAE  ? History of nuclear stress test   ? Myoview 3/18: Mod size and intensity fixed septal defect, may be artifact.Opposite mod size and intensity lat defect, which is reversible and could represent ischemia or possibly artifact (SDS 4). LVEF 71% with normal wall motion. Intermediate risk study. >> images reviewed with Dr. Dorris Carnes - no sig ischemia; med rx   ? Hx of cardiovascular stress test   ? Lexiscan Myoview 6/16:  EF 70%, no scar or ischemia; Low Risk  ? Hyperlipidemia   ? Hypertension   ? Neuropathy    ? Orthostatic hypotension 05/28/2017  ? OSA (obstructive sleep apnea) 01/16/2021  ? Osteoarthritis   ? Rheumatoid arthritis ([facility])   ? RA (Dr. Ouida Sills) Bilateral hands  ? S/P angioplasty with stent 05/27/17 to LCX with DES  05/28/2017  ? Thyroid disease   ? ? ?Past Surgical History:  ?Procedure Laterality Date  ? ABDOMINAL HYSTERECTOMY  1978  ? ANTERIOR CERVICAL DECOMP/DISCECTOMY FUSION N/A 05/29/2018  ? Procedure: ANTERIOR CERVICAL DECOMPRESSION FUSION - CERVICAL FIVE-CERVICAL SIX - CERVICAL SIX-CERVICAL SEVEN;  Surgeon: Earnie Larsson, MD;  Location: [facility];  Service: Neurosurgery;  Laterality: N/A;  ? BACK SURGERY    ? x 2  ? CARPAL TUNNEL RELEASE Left   ? COLONOSCOPY W/ BIOPSIES AND POLYPECTOMY    ? Hx: of  ? CORONARY STENT INTERVENTION N/A 05/27/2017  ? Procedure: CORONARY STENT INTERVENTION;  Surgeon: Burnell Blanks, MD;  Location: [facility];  Service: Cardiovascular;  Laterality: N/A;  ? ESOPHAGOGASTRODUODENOSCOPY    ? HNP    ? LEFT HEART CATH AND CORONARY ANGIOGRAPHY N/A 05/27/2017  ? Procedure: LEFT HEART CATH AND CORONARY ANGIOGRAPHY;  Surgeon: Burnell Blanks, MD;  Location: [facility];  Service: Cardiovascular;  Laterality: N/A;  ? LUMBAR LAMINECTOMY/DECOMPRESSION MICRODISCECTOMY Left 02/12/2013  ?  Procedure: LUMBAR TWO THREE, LUMBAR THREE FOUR, LUMBAR FOUR FIVE  LAMINECTOMY/DECOMPRESSION MICRODISCECTOMY 3 LEVELS;  Surgeon: Charlie Pitter, MD;  Location: [facility];  Service: Neurosurgery;  Laterality: Left;  ? NEPHRECTOMY Right 2010  ? 10.rcc cancer  ? TOTAL KNEE ARTHROPLASTY Right   ? Redo  ? ? ?There were no vitals filed for this visit. ? ? Subjective Assessment - 10/13/21 1631   ? ? Subjective Patient states she is having some right upper trap pain and spasm.   ? Diagnostic tests IMPRESSION:  1. No acute fracture or malalignment.  2. Anterior fusion F8-B0 appears uncomplicated.  3. Progression of degenerative disc changes at C4-C5.   ? Currently in Pain? Yes   ? Pain  Score 3    ? Pain Orientation Right   ? Pain Descriptors / Indicators Burning;Nagging   ? Pain Type Acute pain   ? Pain Onset More than a month ago   ? Pain Frequency Intermittent   ? ?  ?  ? ?  ? ? ? ? ? ? ? ? ? ? ? ? ? ? ? ? ? ? ? ? [facility] Adult PT Treatment/Exercise - 10/13/21 0001   ? ?  ? Neck Exercises: Machines for Strengthening  ? UBE (Upper Arm Bike) 5 min (2.5 min fwd and 2.5 min back)   ?  ? Neck Exercises: Theraband  ? Shoulder Extension Red;20 reps   ? Rows Red;20 reps   ? Shoulder External Rotation 20 reps;Red   ? Horizontal ABduction 20 reps;Red   ?  ? Shoulder Exercises: Prone  ? Retraction Strengthening;Both;20 reps;Weights;Other (comment)   Did this in fwd bent position with knee and opposite on edge of table.  2 sets of 10 with 1 lb dumbell  ? Retraction Weight (lbs) 2   ? Retraction Limitations fwd bent with knee on table   ? Extension Strengthening;Both;20 reps;Weights   Did this in fwd bent position with knee and opposite on edge of table.  2 sets of 10 with 1 lb dumbell  ? Extension Weight (lbs) 2   ? Extension Limitations fwd bent with knee on table   ? Horizontal ABduction 1 Strengthening;Both;20 reps;Limitations;Weights   ? Horizontal ABduction 1 Weight (lbs) 2   ? Horizontal ABduction 1 Limitations Did this in fwd bent position with knee and opposite on edge of table.  2 sets of 10 with 1 lb dumbell   ?  ? Manual Therapy  ? Manual Therapy Soft tissue mobilization   ? Manual therapy comments [place] to right upper trap   ? ?  ?  ? ?  ? ? ? ? ? ? ? ? ? ? ? ? PT Short Term Goals - 09/24/21 1540   ? ?  ? PT SHORT TERM GOAL #1  ? Title [place] with initial HEP   ? Baseline pt requiring cueing for correct form in clinic for most exercises, continues to need mod vc's on 09-24-21   ? Time 4   ? Period Weeks   ? Status On-going   ? Target Date 09/25/21   ?  ? PT SHORT TERM GOAL #2  ? Title report a 30% reduction in Lt shoulder pain with daily use   ? Baseline 75%   ? Time 4   ? Period Weeks   ?  Status Achieved   ? Target Date 11/13/19   ? ?  ?  ? ?  ? ? ? ? PT Long Term Goals -  08/28/21 1938   ? ?  ? PT LONG TERM GOAL #1  ? Title be indpendent in advanced HEP   ? Time 8   ? Period Weeks   ? Status New   ? Target Date 10/23/21   ?  ? PT LONG TERM GOAL #2  ? Title Patient to have full painfree cervical ROM   ? Time 8   ? Period Weeks   ? Status New   ? Target Date 10/23/21   ?  ? PT LONG TERM GOAL #3  ? Title Patient to be able to resume prior exercise routine at the [facility]   ? Time 8   ? Period Weeks   ? Status New   ? Target Date 10/23/21   ?  ? PT LONG TERM GOAL #4  ? Title Patient t be able to sleep through the night without neck pain waking her.   ? Time 8   ? Period Weeks   ? Status New   ? Target Date 10/23/21   ? ?  ?  ? ?  ? ? ? ? ? ? ? ? Plan - 10/13/21 1704   ? ? Clinical Impression Statement Patient was able to complete all tasks but fatigues easily.  She continues to demonstrate rounded shoulders and poor posture.  She would benefit from continued skilled PT for postural and shoulder strengthening along with manual techniques to reduce soft tissue restriction.   ? Personal Factors and Comorbidities Fitness;Comorbidity 1   ? Examination-Activity Limitations Dressing;Bathing;Bed Mobility;Sleep;Reach Overhead   ? Examination-Participation Restrictions Laundry;Cleaning;Driving   ? Stability/Clinical Decision Making Stable/Uncomplicated   ? Clinical Decision Making Low   ? Rehab Potential Good   ? PT Frequency 2x / week   ? PT Duration 8 weeks   ? PT Treatment/Interventions ADLs/Self Care Home Management;Aquatic Therapy;Traction;Moist Heat;Iontophoresis '4mg'$ /ml Dexamethasone;Electrical Stimulation;Cryotherapy;Ultrasound;Gait training;Therapeutic exercise;Therapeutic activities;Functional mobility training;Stair training;Neuromuscular re-education;Patient/family education;Manual techniques;Passive range of motion;Taping;Dry needling;Spinal Manipulations;Joint Manipulations   ? PT Next Visit Plan Patient  responded well to DN and would benefit again when scheduled with certified PT.   We will focus on scapular and postural strength along with the dry needling.   ? PT Home Exercise Plan Access Code: [email or web address]

## 2021-10-14 NOTE — Progress Notes (Signed)
HPI ?F Smoker (1 pk/ week, 15 pkyrs followed for OSA, Bronchiectasis, complicated by  Orthostasis, HTN, CAD/ stent, Allergic Rhinitis, GERD,  ?NPSG 10/07/18- AHI 65.8/ hr, desaturation to 89%, body weight 209 lbs ?HST 02/09/21- AHI 50.5/ hr, desaturation to a nadir of 70%- average 87%, body weight 226 lbs ?PFT 05/04/21- Mild airtrapping but normal flows w/o resp to BD. DLCO is high for volume. ? ?=============================================================== ? ? ?09/07/21-75 yoF Smoker (1 pk/ week, 15 pkyrs followed for OSA, Bronchiectasis, complicated by  Orthostasis, HTN, CAD/ stent, Aortic Atherosclerosis, Allergic Rhinitis, GERD,  ?HST 02/09/21- AHI 50.5/ hr, desaturation to a nadir of 70%- average 87%, body weight 226 lbs ?Son is Denice Paradise ?-Albuterol hfa, Singulair, Flonase ?CPAP auto 4-10/ [facility] ?Body weight today-224 lbs                           daughter here ?Covid vax-1 Phizer ?Flu vax-had ?ED visit for viral syndrome 12/31- given amox to hold. ?Not wearing CPAP- can't tolerate the mask. ?We put her in touch with the pharmacy smoking cessation team who told her she did not qualify because she "was not smoking enough". ?We are working with her bronchiectasis.  She may need more attention to pulmonary toilet and may need a maintenance controller inhaler.  For now we are refilling her albuterol rescue inhaler, trying to get her off of cigarettes and updating CXR. ? ?10/15/21- 75 yoF Smoker (1 pk/ week, 15 pkyrs followed for OSA(failed CPAP), Bronchiectasis, complicated by  Orthostasis, HTN, CAD/ stent, Aortic Atherosclerosis, Allergic Rhinitis, GERD,  ?Son is Yina Riviere ?-Ventolin hfa, Singulair, [employer],  ?CPAP auto 4-10/ [facility] ?Intolerant of CPAP mask- referred for mask fitting by [facility] D[facility] on 2/17 ?Download-compliance 0%, AHI 1.6/ hr> turned it back in ?Body weight today-222 lbs ?Covid vax-2 Phizer ?Flu vax-had ?-----Patient states that she was not using CPAP and they made her turn it in  because she was not using CPAP. Did not like full face mask.  ?Quit smoking 5 weeks ago ! Breathing has improved with little cough or wheeze now. ?I think she is only using a Breztri inhaler from her description, once daily at bedtime. ?She is willing to update home sleep test to reassess and requalify for CPAP. ?CXR 09/08/21- ?IMPRESSION: ?There are no new infiltrates or signs of pulmonary edema. ? ? ?ROS-see HPI   + = positive ?Constitutional:    weight loss, night sweats, fevers, chills, fatigue, lassitude. ?HEENT:    headaches, +difficulty swallowing, tooth/dental problems, sore throat,  ?     sneezing, itching, ear ache, +nasal congestion, post nasal drip, snoring ?CV:    chest pain, orthopnea, PND, swelling in lower extremities, anasarca,      dizziness, palpitations ?Resp:   +shortness of breath with exertion or at rest.   ?             +productive cough,   non-productive cough, coughing up of blood.   ?           change in color of mucus.  wheezing.   ?Skin:    rash or lesions. ?GI:  No-   heartburn, indigestion, abdominal pain, nausea, vomiting, diarrhea,  ?               change in bowel habits, loss of appetite ?GU: dysuria, change in color of urine, no urgency or frequency.   flank pain. ?MS:   +joint pain, stiffness, decreased range of motion, back  pain. ?Neuro-     +dizzy ?Psych:  change in mood or affect.  depression or anxiety.   memory loss. ? ?OBJ- Physical Exam ?General- Alert, Oriented, Affect-appropriate, Distress- none acute, + obese ?Skin- rash-none, lesions- none, excoriation- none ?Lymphadenopathy- none ?Head- atraumatic ?           Eyes- Gross vision intact, PERRLA, conjunctivae and secretions clear ?           Ears- Hearing, canals-normal ?           Nose- Clear, no-Septal dev, mucus, polyps, erosion, perforation  ?           Throat- Mallampati III, mucosa clear , drainage- none, +tonsils, + teeth ?Neck- flexible , trachea midline, no stridor , thyroid nl, carotid no bruit ?Chest -  symmetrical excursion , unlabored ?          Heart/CV- RRR , no murmur , no gallop  , no rub, nl s1 s2 ?                          - JVD- none , edema- none, stasis changes- none, varices- none ?          Lung- +clear, wheeze- none, cough- none , dullness-none, rub- none ?          Chest wall-  ?Abd-  ?Br/ Gen/ Rectal- Not done, not indicated ?Extrem- cyanosis- none, clubbing, none, atrophy- none, strength- nl ?Neuro- grossly intact to observation ? ? ?  ?

## 2021-10-15 ENCOUNTER — Encounter: Payer: Self-pay | Admitting: Internal Medicine

## 2021-10-15 ENCOUNTER — Other Ambulatory Visit: Payer: Self-pay

## 2021-10-15 ENCOUNTER — Ambulatory Visit: Payer: Medicare HMO

## 2021-10-15 ENCOUNTER — Ambulatory Visit: Payer: Medicare HMO | Admitting: Internal Medicine

## 2021-10-15 VITALS — BP 122/70 | HR 64 | Temp 97.7°F | Ht 66.0 in | Wt 222.4 lb

## 2021-10-15 DIAGNOSIS — M542 Cervicalgia: Secondary | ICD-10-CM | POA: Diagnosis not present

## 2021-10-15 DIAGNOSIS — M25512 Pain in left shoulder: Secondary | ICD-10-CM | POA: Diagnosis not present

## 2021-10-15 DIAGNOSIS — R69 Illness, unspecified: Secondary | ICD-10-CM | POA: Diagnosis not present

## 2021-10-15 DIAGNOSIS — G4733 Obstructive sleep apnea (adult) (pediatric): Secondary | ICD-10-CM

## 2021-10-15 DIAGNOSIS — R252 Cramp and spasm: Secondary | ICD-10-CM | POA: Diagnosis not present

## 2021-10-15 DIAGNOSIS — J449 Chronic obstructive pulmonary disease, unspecified: Secondary | ICD-10-CM | POA: Diagnosis not present

## 2021-10-15 DIAGNOSIS — R293 Abnormal posture: Secondary | ICD-10-CM | POA: Diagnosis not present

## 2021-10-15 DIAGNOSIS — M25612 Stiffness of left shoulder, not elsewhere classified: Secondary | ICD-10-CM | POA: Diagnosis not present

## 2021-10-15 DIAGNOSIS — G8929 Other chronic pain: Secondary | ICD-10-CM | POA: Diagnosis not present

## 2021-10-15 DIAGNOSIS — F17200 Nicotine dependence, unspecified, uncomplicated: Secondary | ICD-10-CM

## 2021-10-15 DIAGNOSIS — M436 Torticollis: Secondary | ICD-10-CM | POA: Diagnosis not present

## 2021-10-15 NOTE — Therapy (Signed)
[facility] ?[facility] ?[place], [place], [address] ?Phone: [phone]   Fax:  [phone] ? ?Physical Therapy Treatment ? ?Patient Details  ?Name: [name] ?MRN: [id number] ?Date of Birth: 04-16-1946 ?Referring Provider (PT): [name], MD ? ? ?Encounter Date: 10/15/2021 ? ? PT End of Session - 10/15/21 1100   ? ? Visit Number 7   ? Date for PT Re-Evaluation 10/23/21   ? Authorization Type AETNA MEDICARE   ? PT Start Time 1058   ? PT Stop Time 1145   ? PT Time Calculation (min) 47 min   ? Activity Tolerance Patient tolerated treatment well   ? Behavior During Therapy [facility] for tasks assessed/performed   ? ?  ?  ? ?  ? ? ?Past Medical History:  ?Diagnosis Date  ? Allergy   ? Anxiety   ? C. difficile diarrhea   ? history of  ? CAD (coronary artery disease), native coronary artery   ? a. 05/2017 showed 100% mLCx tx with DES, otherwise 20% mRCA, 50% ramus, 30% dLAD, 20% prox-mid LAD, 20% mLAD treated medically.   ? Cancer [facility])   ? Right kidney CA removed.   ? CKD (chronic kidney disease) stage 3, GFR 30-59 ml/min ([place]) 10/11/2016  ? s/p R nephrectomy  ? Colon polyps 2008  ? HYPERPLASTIC  ? Diabetes mellitus without complication ([place])   ? type 2  no meds  ? Gastritis   ? GERD (gastroesophageal reflux disease)   ? History of echocardiogram   ? Echo 3/18:  Moderate LVH, EF 38-25, grade 1 diastolic dysfunction, calcified aortic valve, mild MR, moderate LAE  ? History of nuclear stress test   ? Myoview 3/18: Mod size and intensity fixed septal defect, may be artifact.Opposite mod size and intensity lat defect, which is reversible and could represent ischemia or possibly artifact (SDS 4). LVEF 71% with normal wall motion. Intermediate risk study. >> images reviewed with Dr. Dorris Carnes - no sig ischemia; med rx   ? Hx of cardiovascular stress test   ? Lexiscan Myoview 6/16:  EF 70%, no scar or ischemia; Low Risk  ? Hyperlipidemia   ? Hypertension   ? Neuropathy    ? Orthostatic hypotension 05/28/2017  ? OSA (obstructive sleep apnea) 01/16/2021  ? Osteoarthritis   ? Rheumatoid arthritis ([place])   ? RA (Dr. Ouida Sills) Bilateral hands  ? S/P angioplasty with stent 05/27/17 to LCX with DES  05/28/2017  ? Thyroid disease   ? ? ?Past Surgical History:  ?Procedure Laterality Date  ? ABDOMINAL HYSTERECTOMY  1978  ? ANTERIOR CERVICAL DECOMP/DISCECTOMY FUSION N/A 05/29/2018  ? Procedure: ANTERIOR CERVICAL DECOMPRESSION FUSION - CERVICAL FIVE-CERVICAL SIX - CERVICAL SIX-CERVICAL SEVEN;  Surgeon: Earnie Larsson, MD;  Location: [place];  Service: Neurosurgery;  Laterality: N/A;  ? BACK SURGERY    ? x 2  ? CARPAL TUNNEL RELEASE Left   ? COLONOSCOPY W/ BIOPSIES AND POLYPECTOMY    ? Hx: of  ? CORONARY STENT INTERVENTION N/A 05/27/2017  ? Procedure: CORONARY STENT INTERVENTION;  Surgeon: Burnell Blanks, MD;  Location: [place] CV LAB;  Service: Cardiovascular;  Laterality: N/A;  ? ESOPHAGOGASTRODUODENOSCOPY    ? HNP    ? LEFT HEART CATH AND CORONARY ANGIOGRAPHY N/A 05/27/2017  ? Procedure: LEFT HEART CATH AND CORONARY ANGIOGRAPHY;  Surgeon: Burnell Blanks, MD;  Location: [place] CV LAB;  Service: Cardiovascular;  Laterality: N/A;  ? LUMBAR LAMINECTOMY/DECOMPRESSION MICRODISCECTOMY Left 02/12/2013  ?  Procedure: LUMBAR TWO THREE, LUMBAR THREE FOUR, LUMBAR FOUR FIVE  LAMINECTOMY/DECOMPRESSION MICRODISCECTOMY 3 LEVELS;  Surgeon: Charlie Pitter, MD;  Location: [place] [facility];  Service: Neurosurgery;  Laterality: Left;  ? NEPHRECTOMY Right 2010  ? 10.rcc cancer  ? TOTAL KNEE ARTHROPLASTY Right   ? Redo  ? ? ?There were no vitals filed for this visit. ? ? Subjective Assessment - 10/15/21 1102   ? ? Subjective Patient reports she is still having pain in the right upper trap.  She rates her pain at 7/10 in this area.   ? Diagnostic tests IMPRESSION:  1. No acute fracture or malalignment.  2. Anterior fusion C7-E9 appears uncomplicated.  3. Progression of degenerative disc changes at C4-C5.    ? Currently in Pain? Yes   ? Pain Score 7    ? Pain Location Shoulder   ? Pain Orientation Right   ? Pain Descriptors / Indicators Aching   ? Pain Type Chronic pain   ? Pain Onset More than a month ago   ? ?  ?  ? ?  ? ? ? ? ? ? ? ? ? ? ? ? ? ? ? ? ? ? ? ? [facility] Adult PT Treatment/Exercise - 10/15/21 0001   ? ?  ? Neck Exercises: Machines for Strengthening  ? UBE (Upper Arm Bike) 5 min (2.5 min fwd and 2.5 min back)   ?  ? Neck Exercises: Theraband  ? Shoulder Extension Red;20 reps   ? Rows Red;20 reps   ? Shoulder External Rotation 20 reps;Red   ? Horizontal ABduction 20 reps;Red   ?  ? Shoulder Exercises: Supine  ? Protraction Strengthening;Right;20 reps;Weights   ? Protraction Weight (lbs) 2   ?  ? Shoulder Exercises: Prone  ? Retraction Strengthening;Both;20 reps;Weights;Other (comment)   Did this in fwd bent position with knee and opposite on edge of table.  2 sets of 10 with 1 lb dumbell  ? Retraction Weight (lbs) 2   ? Retraction Limitations fwd bent with knee on table   ? Extension Strengthening;Both;20 reps;Weights   Did this in fwd bent position with knee and opposite on edge of table.  2 sets of 10 with 1 lb dumbell  ? Extension Weight (lbs) 2   ? Extension Limitations fwd bent with knee on table   ? Horizontal ABduction 1 Strengthening;Both;20 reps;Limitations;Weights   ? Horizontal ABduction 1 Weight (lbs) 2   ? Horizontal ABduction 1 Limitations Did this in fwd bent position with knee and opposite on edge of table.  2 sets of 10 with 1 lb dumbell   ?  ? Shoulder Exercises: Sidelying  ? External Rotation Strengthening;Right;Weights;20 reps   ? External Rotation Weight (lbs) 1   ?  ? Manual Therapy  ? Manual Therapy Soft tissue mobilization   ? Manual therapy comments [place] to right upper trap   ? ?  ?  ? ?  ? ? ? ? ? ? ? ? ? ? ? ? PT Short Term Goals - 09/24/21 1540   ? ?  ? PT SHORT TERM GOAL #1  ? Title Independence with initial HEP   ? Baseline pt requiring cueing for correct form in clinic for  most exercises, continues to need mod vc's on 09-24-21   ? Time 4   ? Period Weeks   ? Status On-going   ? Target Date 09/25/21   ?  ? PT SHORT TERM GOAL #2  ? Title report a 30%  reduction in Lt shoulder pain with daily use   ? Baseline 75%   ? Time 4   ? Period Weeks   ? Status Achieved   ? Target Date 11/13/19   ? ?  ?  ? ?  ? ? ? ? PT Long Term Goals - 08/28/21 1938   ? ?  ? PT LONG TERM GOAL #1  ? Title be indpendent in advanced HEP   ? Time 8   ? Period Weeks   ? Status New   ? Target Date 10/23/21   ?  ? PT LONG TERM GOAL #2  ? Title Patient to have full painfree cervical ROM   ? Time 8   ? Period Weeks   ? Status New   ? Target Date 10/23/21   ?  ? PT LONG TERM GOAL #3  ? Title Patient to be able to resume prior exercise routine at the [facility]   ? Time 8   ? Period Weeks   ? Status New   ? Target Date 10/23/21   ?  ? PT LONG TERM GOAL #4  ? Title Patient t be able to sleep through the night without neck pain waking her.   ? Time 8   ? Period Weeks   ? Status New   ? Target Date 10/23/21   ? ?  ?  ? ?  ? ? ? ? ? ? ? ? Plan - 10/15/21 1142   ? ? Clinical Impression Statement Patient was able to complete all exercises today with less fatigue and improved form.  She is gaining right RC strength and is able to reach overhead with less compensation but right upper trap and neck are still uncomfortable due to the compensation.  She would benefit from continued skilled PT for right shoulder and postural strength to correct arthrokinematics of right glenohumeral joint.   ? Personal Factors and Comorbidities Fitness;Comorbidity 1   ? Examination-Activity Limitations Dressing;Bathing;Bed Mobility;Sleep;Reach Overhead   ? Examination-Participation Restrictions Laundry;Cleaning;Driving   ? Stability/Clinical Decision Making Stable/Uncomplicated   ? Clinical Decision Making Low   ? Rehab Potential Good   ? PT Frequency 2x / week   ? PT Duration 8 weeks   ? PT Treatment/Interventions ADLs/Self Care Home Management;Aquatic  Therapy;Traction;Moist Heat;Iontophoresis '4mg'$ /ml Dexamethasone;Electrical Stimulation;Cryotherapy;Ultrasound;Gait training;Therapeutic exercise;Therapeutic activities;Functional mobility training;Stair train

## 2021-10-15 NOTE — Patient Instructions (Signed)
Order- schedule home sleep test   dx OSA ? ?Check the name, but I think the inhaler you are using is Breztri    inhale 2 puffs then rinse mouth, twice daily ? ?Please call us for results of your sleep test about 2 weeks after your test ?

## 2021-10-15 NOTE — Assessment & Plan Note (Signed)
We are going to update home sleep test, anticipating starting anew with CPAP.  ?Plan- HST ?

## 2021-10-15 NOTE — Assessment & Plan Note (Addendum)
Less cough and wheeze since she quit smoking. ?Need to verify she is using a Breztri inhaler, not Ventolin. ?

## 2021-10-15 NOTE — Assessment & Plan Note (Signed)
Reports quitting smoking in February ?Strongly supported ?

## 2021-10-19 ENCOUNTER — Other Ambulatory Visit: Payer: Self-pay

## 2021-10-19 ENCOUNTER — Ambulatory Visit: Payer: Medicare HMO

## 2021-10-19 DIAGNOSIS — R252 Cramp and spasm: Secondary | ICD-10-CM | POA: Diagnosis not present

## 2021-10-19 DIAGNOSIS — M436 Torticollis: Secondary | ICD-10-CM | POA: Diagnosis not present

## 2021-10-19 DIAGNOSIS — M25612 Stiffness of left shoulder, not elsewhere classified: Secondary | ICD-10-CM | POA: Diagnosis not present

## 2021-10-19 DIAGNOSIS — G8929 Other chronic pain: Secondary | ICD-10-CM | POA: Diagnosis not present

## 2021-10-19 DIAGNOSIS — R293 Abnormal posture: Secondary | ICD-10-CM | POA: Diagnosis not present

## 2021-10-19 DIAGNOSIS — M542 Cervicalgia: Secondary | ICD-10-CM

## 2021-10-19 DIAGNOSIS — M25512 Pain in left shoulder: Secondary | ICD-10-CM | POA: Diagnosis not present

## 2021-10-19 NOTE — Therapy (Signed)
[place] ?[facility] [place] ?[place], [place], [address] ?Phone: [phone]   Fax:  [phone] ? ?Physical Therapy Treatment ? ?Patient Details  ?Name: [name] ?MRN: [id number] ?Date of Birth: 1946-07-09 ?Referring Provider (PT): [name], MD ? ? ?Encounter Date: 10/19/2021 ? ? PT End of Session - 10/19/21 1525   ? ? Visit Number 8   ? Date for PT Re-Evaluation 10/23/21   ? Authorization Type AETNA MEDICARE   ? PT Start Time 6195   ? PT Stop Time 1526   ? PT Time Calculation (min) 40 min   ? Activity Tolerance Patient tolerated treatment well   ? Behavior During Therapy [facility] for tasks assessed/performed   ? ?  ?  ? ?  ? ? ?Past Medical History:  ?Diagnosis Date  ? Allergy   ? Anxiety   ? C. difficile diarrhea   ? history of  ? CAD (coronary artery disease), native coronary artery   ? a. 05/2017 showed 100% mLCx tx with DES, otherwise 20% mRCA, 50% ramus, 30% dLAD, 20% prox-mid LAD, 20% mLAD treated medically.   ? Cancer [facility])   ? Right kidney CA removed.   ? CKD (chronic kidney disease) stage 3, GFR 30-59 ml/min ([place]) 10/11/2016  ? s/p R nephrectomy  ? Colon polyps 2008  ? HYPERPLASTIC  ? Diabetes mellitus without complication ([place])   ? type 2  no meds  ? Gastritis   ? GERD (gastroesophageal reflux disease)   ? History of echocardiogram   ? Echo 3/18:  Moderate LVH, EF 09-32, grade 1 diastolic dysfunction, calcified aortic valve, mild MR, moderate LAE  ? History of nuclear stress test   ? Myoview 3/18: Mod size and intensity fixed septal defect, may be artifact.Opposite mod size and intensity lat defect, which is reversible and could represent ischemia or possibly artifact (SDS 4). LVEF 71% with normal wall motion. Intermediate risk study. >> images reviewed with Dr. Dorris Carnes - no sig ischemia; med rx   ? Hx of cardiovascular stress test   ? Lexiscan Myoview 6/16:  EF 70%, no scar or ischemia; Low Risk  ? Hyperlipidemia   ? Hypertension   ? Neuropathy    ? Orthostatic hypotension 05/28/2017  ? OSA (obstructive sleep apnea) 01/16/2021  ? Osteoarthritis   ? Rheumatoid arthritis ([place])   ? RA (Dr. Ouida Sills) Bilateral hands  ? S/P angioplasty with stent 05/27/17 to LCX with DES  05/28/2017  ? Thyroid disease   ? ? ?Past Surgical History:  ?Procedure Laterality Date  ? ABDOMINAL HYSTERECTOMY  1978  ? ANTERIOR CERVICAL DECOMP/DISCECTOMY FUSION N/A 05/29/2018  ? Procedure: ANTERIOR CERVICAL DECOMPRESSION FUSION - CERVICAL FIVE-CERVICAL SIX - CERVICAL SIX-CERVICAL SEVEN;  Surgeon: Earnie Larsson, MD;  Location: [place];  Service: Neurosurgery;  Laterality: N/A;  ? BACK SURGERY    ? x 2  ? CARPAL TUNNEL RELEASE Left   ? COLONOSCOPY W/ BIOPSIES AND POLYPECTOMY    ? Hx: of  ? CORONARY STENT INTERVENTION N/A 05/27/2017  ? Procedure: CORONARY STENT INTERVENTION;  Surgeon: Burnell Blanks, MD;  Location: [place] CV LAB;  Service: Cardiovascular;  Laterality: N/A;  ? ESOPHAGOGASTRODUODENOSCOPY    ? HNP    ? LEFT HEART CATH AND CORONARY ANGIOGRAPHY N/A 05/27/2017  ? Procedure: LEFT HEART CATH AND CORONARY ANGIOGRAPHY;  Surgeon: Burnell Blanks, MD;  Location: [place] CV LAB;  Service: Cardiovascular;  Laterality: N/A;  ? LUMBAR LAMINECTOMY/DECOMPRESSION MICRODISCECTOMY Left 02/12/2013  ?  Procedure: LUMBAR TWO THREE, LUMBAR THREE FOUR, LUMBAR FOUR FIVE  LAMINECTOMY/DECOMPRESSION MICRODISCECTOMY 3 LEVELS;  Surgeon: Charlie Pitter, MD;  Location: [place] NE[facility];  Service: Neurosurgery;  Laterality: Left;  ? NEPHRECTOMY Right 2010  ? 10.rcc cancer  ? TOTAL KNEE ARTHROPLASTY Right   ? Redo  ? ? ?There were no vitals filed for this visit. ? ? Subjective Assessment - 10/19/21 1447   ? ? Subjective I feel OK.  I feel like things are feeling a little bit better.  70% better since the start of care.   ? Diagnostic tests IMPRESSION:  1. No acute fracture or malalignment.  2. Anterior fusion W0-J8 appears uncomplicated.  3. Progression of degenerative disc changes at C4-C5.   ?  Currently in Pain? Yes   ? Pain Score 4    ? Pain Location Shoulder   ? Pain Orientation Right   ? Pain Descriptors / Indicators Aching   ? Pain Type Chronic pain   ? Pain Onset More than a month ago   ? Pain Frequency Intermittent   ? Aggravating Factors  I don't really know, sometimes using Rt UE more   ? Pain Relieving Factors rest, heat   ? ?  ?  ? ?  ? ? ? ? ? ? ? ? ? ? ? ? ? ? ? ? ? ? ? ? [facility] Adult PT Treatment/Exercise - 10/19/21 0001   ? ?  ? Neck Exercises: Machines for Strengthening  ? UBE (Upper Arm Bike) 5 min (2.5 min fwd and 2.5 min back)   ?  ? Neck Exercises: Theraband  ? Shoulder Extension Red;20 reps   tactile cues for scapular depression  ? Rows Red;20 reps   tactile cues for scapular depression  ?  ? Manual Therapy  ? Manual Therapy Soft tissue mobilization;Myofascial release   ? Manual therapy comments skilled palpation and monitoring with DN   ? Soft tissue mobilization elongation to bil neck and upper traps   ? ?  ?  ? ?  ? ? ? Trigger Point Dry Needling - 10/19/21 0001   ? ? Consent Given? Yes   ? Education Handout Provided Previously provided   ? Muscles Treated Head and Neck Upper trapezius;Cervical multifidi;Suboccipitals   ? Upper Trapezius Response Palpable increased muscle length;Twitch reponse elicited   ? Suboccipitals Response Twitch response elicited;Palpable increased muscle length   ? Cervical multifidi Response Twitch reponse elicited;Palpable increased muscle length   ? ?  ?  ? ?  ? ? ? ? ? ? ? ? ? ? PT Short Term Goals - 09/24/21 1540   ? ?  ? PT SHORT TERM GOAL #1  ? Title Independence with initial HEP   ? Baseline pt requiring cueing for correct form in clinic for most exercises, continues to need mod vc's on 09-24-21   ? Time 4   ? Period Weeks   ? Status On-going   ? Target Date 09/25/21   ?  ? PT SHORT TERM GOAL #2  ? Title report a 30% reduction in Lt shoulder pain with daily use   ? Baseline 75%   ? Time 4   ? Period Weeks   ? Status Achieved   ? Target Date 11/13/19    ? ?  ?  ? ?  ? ? ? ? PT Long Term Goals - 10/19/21 1451   ? ?  ? PT LONG TERM GOAL #1  ? Title be indpendent in advanced  HEP   ? Status On-going   ?  ? PT LONG TERM GOAL #2  ? Title Patient to have full painfree cervical ROM   ?  ? PT LONG TERM GOAL #3  ? Title Patient to be able to resume prior exercise routine at the [facility]   ?  ? PT LONG TERM GOAL #4  ? Title Patient t be able to sleep through the night without neck pain waking her.   ? Baseline waking intermittently at night when rolling over in bed   ? Status On-going   ?  ? PT LONG TERM GOAL #5  ? Title report < or = to 3-4/10 Lt shoulder pain with reaching overhead or cleaning   ? Baseline up to 7/10   ? Status On-going   ? ?  ?  ? ?  ? ? ? ? ? ? ? ? Plan - 10/19/21 1458   ? ? Clinical Impression Statement Pt reports 70% overall improvement in symptoms since the start of care.  Pt has not yet returned to the [facility] and would like to do this after D/C.  Pt reports 3-7/10 Rt neck and UE pain.  Pt is waking less at night and only when rolling over at night now.  Pt with tension and trigger points in Rt neck and upper traps and had good response to DN today with improved tissue mobility and good twitch response with needling.  Pt will likely D/C to HEP next session with plan to return to the gym.   ? Rehab Potential Good   ? PT Frequency 2x / week   ? PT Duration 8 weeks   ? PT Treatment/Interventions ADLs/Self Care Home Management;Aquatic Therapy;Traction;Moist Heat;Iontophoresis '4mg'$ /ml Dexamethasone;Electrical Stimulation;Cryotherapy;Ultrasound;Gait training;Therapeutic exercise;Therapeutic activities;Functional mobility training;Stair training;Neuromuscular re-education;Patient/family education;Manual techniques;Passive range of motion;Taping;Dry needling;Spinal Manipulations;Joint Manipulations   ? PT Next Visit Plan probable D/C next session.  Review and finalize HEP   ? PT Home Exercise Plan Access Code: [email or web address]   ? Consulted and Agree with Plan of Care  Patient   ? ?  ?  ? ?  ? ? ?Patient will benefit from skilled therapeutic intervention in order to improve the following deficits and impairments:  Pain, Postural dysfunction, Impaired UE functional use, Increa

## 2021-10-21 ENCOUNTER — Ambulatory Visit: Payer: Medicare HMO

## 2021-10-21 ENCOUNTER — Other Ambulatory Visit: Payer: Self-pay

## 2021-10-21 DIAGNOSIS — M25612 Stiffness of left shoulder, not elsewhere classified: Secondary | ICD-10-CM | POA: Diagnosis not present

## 2021-10-21 DIAGNOSIS — R293 Abnormal posture: Secondary | ICD-10-CM

## 2021-10-21 DIAGNOSIS — M542 Cervicalgia: Secondary | ICD-10-CM | POA: Diagnosis not present

## 2021-10-21 DIAGNOSIS — M436 Torticollis: Secondary | ICD-10-CM | POA: Diagnosis not present

## 2021-10-21 DIAGNOSIS — R252 Cramp and spasm: Secondary | ICD-10-CM | POA: Diagnosis not present

## 2021-10-21 DIAGNOSIS — M25512 Pain in left shoulder: Secondary | ICD-10-CM | POA: Diagnosis not present

## 2021-10-21 DIAGNOSIS — G8929 Other chronic pain: Secondary | ICD-10-CM | POA: Diagnosis not present

## 2021-10-21 NOTE — Therapy (Signed)
[facility] [address] [place], [place], [address] Phone: [phone]   Fax:  [phone]  Physical Therapy Treatment  Patient Details  Name: [name] MRN: [id number] Date of Birth: 01-24-1946 Referring Provider (PT): Betty Swaziland, MD   Encounter Date: 10/21/2021   PT End of Session - 10/21/21 1454     Visit Number 9    Date for PT Re-Evaluation 10/23/21    Authorization Type [employer]ET[employer] MEDICARE    PT Start Time 1448    PT Stop Time 1530    PT Time Calculation (min) 42 min    Activity Tolerance Patient tolerated treatment well    Behavior During Therapy [facility] for tasks assessed/performed             Past Medical History:  Diagnosis Date   Allergy    Anxiety    C. difficile diarrhea    history of   CAD (coronary artery disease), native coronary artery    a. 05/2017 showed 100% mLCx tx with DES, otherwise 20% mRCA, 50% ramus, 30% dLAD, 20% prox-mid LAD, 20% mLAD treated medically.    Cancer [facility])    Right kidney CA removed.    CKD (chronic kidney disease) stage 3, GFR 30-59 ml/min (HCC) 10/11/2016   s/p R nephrectomy   Colon polyps 2008   HYPERPLASTIC   Diabetes mellitus without complication (HCC)    type 2  no meds   Gastritis    GERD (gastroesophageal reflux disease)    History of echocardiogram    Echo 3/18:  Moderate LVH, EF 60-65, grade 1 diastolic dysfunction, calcified aortic valve, mild MR, moderate LAE   History of nuclear stress test    Myoview 3/18: Mod size and intensity fixed septal defect, may be artifact.Opposite mod size and intensity lat defect, which is reversible and could represent ischemia or possibly artifact (SDS 4). LVEF 71% with normal wall motion. Intermediate risk study. >> images reviewed with Dr. Dietrich Pates - no sig ischemia; med rx    Hx of cardiovascular stress test    Lexiscan Myoview 6/16:  EF 70%, no scar or ischemia; Low Risk   Hyperlipidemia    Hypertension    Neuropathy     Orthostatic hypotension 05/28/2017   OSA (obstructive sleep apnea) 01/16/2021   Osteoarthritis    Rheumatoid arthritis (HCC)    RA (Dr. Dareen Piano) Bilateral hands   S/P angioplasty with stent 05/27/17 to LCX with DES  05/28/2017   Thyroid disease     Past Surgical History:  Procedure Laterality Date   ABDOMINAL HYSTERECTOMY  1978   ANTERIOR CERVICAL DECOMP/DISCECTOMY FUSION N/A 05/29/2018   Procedure: ANTERIOR CERVICAL DECOMPRESSION FUSION - CERVICAL FIVE-CERVICAL SIX - CERVICAL SIX-CERVICAL SEVEN;  Surgeon: Julio Sicks, MD;  Location: [facility] OR;  Service: Neurosurgery;  Laterality: N/A;   BACK SURGERY     x 2   CARPAL TUNNEL RELEASE Left    COLONOSCOPY W/ BIOPSIES AND POLYPECTOMY     Hx: of   CORONARY STENT INTERVENTION N/A 05/27/2017   Procedure: CORONARY STENT INTERVENTION;  Surgeon: Kathleene Hazel, MD;  Location: [facility] INVASIVE CV LAB;  Service: Cardiovascular;  Laterality: N/A;   ESOPHAGOGASTRODUODENOSCOPY     HNP     LEFT HEART CATH AND CORONARY ANGIOGRAPHY N/A 05/27/2017   Procedure: LEFT HEART CATH AND CORONARY ANGIOGRAPHY;  Surgeon: Kathleene Hazel, MD;  Location: [facility] INVASIVE CV LAB;  Service: Cardiovascular;  Laterality: N/A;   LUMBAR LAMINECTOMY/DECOMPRESSION MICRODISCECTOMY Left 02/12/2013  Procedure: LUMBAR TWO THREE, LUMBAR THREE FOUR, LUMBAR FOUR FIVE  LAMINECTOMY/DECOMPRESSION MICRODISCECTOMY 3 LEVELS;  Surgeon: Temple Pacini, MD;  Location: [facility] NEURO ORS;  Service: Neurosurgery;  Laterality: Left;   NEPHRECTOMY Right 2010   10.rcc cancer   TOTAL KNEE ARTHROPLASTY Right    Redo    There were no vitals filed for this visit.   Subjective Assessment - 10/21/21 1458     Subjective Patient states she is doing well.  She states she continues to feel 70% better.  She would like to go ahead and DC today and begin working out at [employer].  She feels she has a good handle on her shoulder and neck exercises and can continue to do these on her own.    Diagnostic  tests IMPRESSION:  1. No acute fracture or malalignment.  2. Anterior fusion C5-C7 appears uncomplicated.  3. Progression of degenerative disc changes at C4-C5.    Currently in Pain? Yes    Pain Score 4     Pain Location Shoulder    Pain Orientation Right    Pain Descriptors / Indicators Aching    Pain Type Chronic pain    Pain Onset More than a month ago                [facility] PT Assessment - 10/21/21 0001       Assessment   Medical Diagnosis M54.2 (ICD-10-CM) - Neck pain on right side    Referring Provider (PT) Betty Swaziland, MD    Onset Date/Surgical Date 07/28/22    Hand Dominance Right    Next MD Visit 6 weeks she thinks- not listed under appts    Prior Therapy no      Cognition   Overall Cognitive Status Within Functional Limits for tasks assessed      Sensation   Light Touch Appears Intact      Posture/Postural Control   Posture/Postural Control Postural limitations    Postural Limitations Rounded Shoulders;Forward head    Posture Comments Patient more aware and self corrects without verbal cues      ROM / Strength   AROM / PROM / Strength AROM;Strength      AROM   Overall AROM Comments bilateral shoulder ROM WNL    AROM Assessment Site Shoulder    Right/Left Shoulder Right   wnl   Right Shoulder Extension --   wnl   Right Shoulder Flexion --   wnl   Right Shoulder ABduction --   wnl   Right Shoulder Internal Rotation --   wnl   Right Shoulder External Rotation --   wnl   Right Shoulder Horizontal ABduction --   wnl   Right Shoulder Horizontal  ADduction --   wnl   Cervical Flexion wnl    Cervical Extension wnl    Cervical - Right Side Bend 75%    Cervical - Left Side Bend 75%    Cervical - Right Rotation wnl    Cervical - Left Rotation wnl      Strength   Strength Assessment Site Shoulder    Right/Left Shoulder Right    Right Shoulder Flexion 4+/5    Right Shoulder ABduction 4+/5    Right Shoulder Internal Rotation 4+/5    Right Shoulder External  Rotation 4/5    Left Shoulder Flexion 4+/5    Left Shoulder ABduction 4+/5    Left Shoulder Internal Rotation 4+/5    Left Shoulder External Rotation 4/5  Palpation   Palpation comment Tender to palpation right upper trap but no trigger points detected                                    PT Education - 10/21/21 1511     Education Details Reviewed HEP              PT Short Term Goals - 10/21/21 1726       PT SHORT TERM GOAL #1   Title Independence with initial HEP    Baseline pt requiring cueing for correct form in clinic for most exercises, continues to need mod vc's on 09-24-21    Time 4    Period Weeks    Status Achieved    Target Date 09/25/21      PT SHORT TERM GOAL #2   Title report a 30% reduction in Lt shoulder pain with daily use    Baseline 75%    Time 4    Period Weeks    Status Achieved    Target Date 11/13/19      PT SHORT TERM GOAL #3   Title report > or = to 75% use of Lt UE with home tasks    Time 4    Period Weeks    Status Achieved               PT Long Term Goals - 10/21/21 1726       PT LONG TERM GOAL #1   Title be indpendent in advanced HEP    Time 8    Period Weeks    Status Achieved    Target Date 10/23/21      PT LONG TERM GOAL #2   Title Patient to have full painfree cervical ROM    Time 8    Period Weeks    Status Achieved    Target Date 10/23/21      PT LONG TERM GOAL #3   Title Patient to be able to resume prior exercise routine at the [employer]    Time 8    Period Weeks    Status Achieved    Target Date 10/23/21      PT LONG TERM GOAL #4   Title Patient t be able to sleep through the night without neck pain waking her.    Baseline waking intermittently at night when rolling over in bed    Time 8    Period Weeks    Status Achieved    Target Date 10/23/21      PT LONG TERM GOAL #5   Title report < or = to 3-4/10 Lt shoulder pain with reaching overhead or cleaning    Baseline up to  7/10    Time 8    Period Weeks    Status Achieved    Target Date 12/11/19                   Plan - 10/21/21 1511     Clinical Impression Statement [name] has shown significant progress with overall function.  She no longer has any neck pain but does have some lingering right upper trap discomfort that is typically relieved with exercise.  She is independent and compliant with her HEP.  She will be resuming her regular exercise routine at the [facility].  She should continue to improve.  We will DC at this time.    Personal Factors  and Comorbidities Fitness;Comorbidity 1    Examination-Activity Limitations Dressing;Bathing;Bed Mobility;Sleep;Reach Overhead    Examination-Participation Restrictions Laundry;Cleaning;Driving    Stability/Clinical Decision Making Stable/Uncomplicated    Clinical Decision Making Low    Rehab Potential Good    PT Frequency 2x / week    PT Duration 8 weeks    PT Treatment/Interventions ADLs/Self Care Home Management;Aquatic Therapy;Traction;Moist Heat;Iontophoresis 4mg /ml Dexamethasone;Electrical Stimulation;Cryotherapy;Ultrasound;Gait training;Therapeutic exercise;Therapeutic activities;Functional mobility training;Stair training;Neuromuscular re-education;Patient/family education;Manual techniques;Passive range of motion;Taping;Dry needling;Spinal Manipulations;Joint Manipulations    PT Next Visit Plan DC at this time    PT Home Exercise Plan Access Code: [email or web address]    Consulted and Agree with Plan of Care Patient             Patient will benefit from skilled therapeutic intervention in order to improve the following deficits and impairments:  Pain, Postural dysfunction, Impaired UE functional use, Increased fascial restricitons, Decreased strength, Decreased range of motion, Increased edema  Visit Diagnosis: Stiffness of neck  Cramp and spasm  Chronic left shoulder pain  Cervicalgia  Stiffness of left shoulder, not elsewhere classified  Abnormal  posture  PHYSICAL THERAPY DISCHARGE SUMMARY  Visits from Start of Care: 9  Current functional level related to goals / functional outcomes: See above   Remaining deficits: See above   Education / Equipment: See above   Patient agrees to discharge. Patient goals were met. Patient is being discharged due to meeting the stated rehab goals.    Problem List Patient Active Problem List   Diagnosis Date Noted   OSA (obstructive sleep apnea) 01/16/2021   COPD mixed type (HCC) 01/16/2021   Fatigue 11/03/2020   B12 deficiency 11/03/2020   Chest pain 08/30/2020   Malignant neoplasm of kidney excluding renal pelvis, unspecified laterality (HCC) 09/26/2019   Vitamin D deficiency, unspecified 09/26/2019   Anxiety disorder, unspecified 03/26/2019   Tobacco use disorder 09/29/2018   Cervical myelopathy (HCC) 05/29/2018   ANA positive 12/08/2017   Cervicalgia 09/16/2017   Carpal tunnel syndrome of left wrist 07/21/2017   Chondrocalcinosis 07/21/2017   Neuropathy 07/21/2017   Orthostatic hypotension 05/28/2017   S/P angioplasty with stent 05/27/17 to LCX with DES  05/28/2017   CAD (coronary artery disease)    Polyneuropathy 05/25/2017   Chest pain with moderate risk for cardiac etiology 05/25/2017   Class 1 obesity with serious comorbidity and body mass index (BMI) of 34.0 to 34.9 in adult 12/06/2016   CKD (chronic kidney disease) stage 3, GFR 30-59 ml/min (HCC) 10/11/2016   Hot flashes, menopausal 10/01/2016   Chest pain with moderate risk of acute coronary syndrome 10/25/2014   Degenerative spondylolisthesis 10/15/2014   Spinal stenosis, lumbar region, with neurogenic claudication 02/12/2013   DM (diabetes mellitus), type 2 with renal complications (HCC) 03/05/2010   History of Rtr nephrectomy 2010, secondary to renal cell cancer 01/14/2009   PERSONAL HX COLONIC POLYPS 11/14/2008   Hyperlipidemia associated with type 2 diabetes mellitus (HCC) 11/16/2007   ESOPHAGEAL STRICTURE  10/12/2007   Rheumatoid arthritis (HCC) 02/27/2007   Hypertension with heart disease 01/19/2007   Allergic rhinitis 01/19/2007   GERD 01/19/2007    Victorino Dike B. Zamaya Rapaport, PT 03/22/235:30 PM   [facility] [address] [place], [place], [address] Phone: [phone]   Fax:  [phone]  Name: [name] MRN: [id number] Date of Birth: [birth year]

## 2021-10-21 NOTE — Patient Instructions (Signed)
Reviewed all of HEP and instructed in appropriate frequency and duration of HEP.  Instructed activities to avoid when she starts a [employer] included overhead press and lat pull down behind the head.  ?

## 2021-10-22 ENCOUNTER — Other Ambulatory Visit: Payer: Self-pay | Admitting: Family Medicine

## 2021-10-22 DIAGNOSIS — I119 Hypertensive heart disease without heart failure: Secondary | ICD-10-CM

## 2021-10-26 ENCOUNTER — Telehealth: Payer: Self-pay | Admitting: Pharmacist

## 2021-10-26 NOTE — Progress Notes (Addendum)
? ? ?Chronic Care Management ?Pharmacy Assistant  ? ?Name: [name]  MRN: [id number] DOB: 05-05-1946 ? ?Reason for Encounter: Medication Review Medication Coordination  ?  ?Recent office visits:  ?None ? ?Recent consult visits:  ?10/21/21 Candyce Churn B, (PT) - Patient presented for Stiffness of neck. No medication changes. ? ?10/19/21 Danie Binder, (PT) - Patient presented for Stiffness of neck and other concerns. No medication changes. ? ?10/15/21 Deneise Lever, MD (Pulmonology) - Patient presented for Obstructive sleep apnea and other concerns. No medication changes. ? ?Hospital visits:  ?None in previous 6 months ? ?Medications: ?Outpatient Encounter Medications as of 10/26/2021  ?Medication Sig Note  ? acetaminophen (TYLENOL) 500 MG tablet Take 1,000 mg by mouth as needed for moderate pain or headache.  05/19/2021: prn  ? albuterol (VENTOLIN HFA) 108 (90 Base) MCG/ACT inhaler INHALE ONE OR TWO PUFFS BY MOUTH INTO THE LUNGS EVERY SIX HOURS AS NEEDED FOR WHEEZING OR SHORTNESS OF BREATH   ? Alirocumab (PRALUENT) 75 MG/ML SOAJ Inject 75 mg into the skin every 14 (fourteen) days.   ? aspirin 81 MG chewable tablet Chew 81 mg by mouth daily.   ? Continuous Blood Gluc Receiver (FREESTYLE LIBRE 2 READER) DEVI Use to check blood sugar, dx:e11.9   ? Continuous Blood Gluc Sensor (FREESTYLE LIBRE 2 SENSOR) MISC Use to check blood sugar, dx:e11.9   ? cyclobenzaprine (FLEXERIL) 10 MG tablet Take 1 tablet (10 mg total) by mouth 2 (two) times daily as needed for muscle spasms.   ? ezetimibe (ZETIA) 10 MG tablet Take 1 tablet (10 mg total) by mouth daily.   ? famotidine (PEPCID) 20 MG tablet TAKE ONE TABLET BY MOUTH EVERY MORNING and TAKE ONE TABLET BY MOUTH EVERYDAY AT BEDTIME   ? fluticasone (FLONASE) 50 MCG/ACT nasal spray PLACE 2 SPRAYS INTO BOTH NOSTRILS AT BEDTIME AS NEEDED FOR ALLERGIES.   ? Fluticasone-Umeclidin-Vilant (TRELEGY ELLIPTA) 100-62.5-25 MCG/INH AEPB Inhale 1 puff into the lungs daily.   ? gabapentin  (NEURONTIN) 300 MG capsule Take 2 capsules (600 mg total) by mouth 2 (two) times daily.   ? hydrochlorothiazide (HYDRODIURIL) 25 MG tablet Take 1 tablet (25 mg total) by mouth daily.   ? losartan (COZAAR) 100 MG tablet Take 1 tablet (100 mg total) by mouth daily.   ? metoprolol succinate (TOPROL-XL) 100 MG 24 hr tablet TAKE ONE TABLET BY MOUTH AT NOON   ? montelukast (SINGULAIR) 10 MG tablet TAKE ONE TABLET BY MOUTH EVERYDAY AT BEDTIME   ? MYRBETRIQ 25 MG TB24 tablet Take 25 mg by mouth daily. 05/19/2021: Patient was taking but in the donut hole with insurance,too $ high  ? nitroGLYCERIN (NITROSTAT) 0.4 MG SL tablet Place 1 tablet (0.4 mg total) under the tongue every 5 (five) minutes as needed for chest pain.   ? ondansetron (ZOFRAN-ODT) 4 MG disintegrating tablet '4mg'$  ODT q4 hours prn nausea/vomit   ? pantoprazole (PROTONIX) 40 MG tablet Take 1 tablet (40 mg total) by mouth daily.   ? polyethylene glycol powder (GLYCOLAX/MIRALAX) powder Take 17 g by mouth daily.   ? rosuvastatin (CRESTOR) 20 MG tablet Take 1 tablet (20 mg total) by mouth daily.   ? ?No facility-administered encounter medications on file as of 10/26/2021.  ?Reviewed chart for medication changes ahead of medication coordination call. ? ?No OVs, Consults, or hospital visits since last care coordination call/Pharmacist visit.  ? ?No medication changes indicated  ?BP Readings from Last 3 Encounters:  ?10/15/21 122/70  ?09/07/21 134/76  ?08/27/21  122/62  ?  ?Lab Results  ?Component Value Date  ? HGBA1C 5.9 04/17/2021  ?  ? ?Patient obtains medications through Adherence Packaging  30 Days  ? ?Last adherence delivery included:  ?Rosuvastatin 20 mg - take one tablet at bedtime ?Losartan 100 mg - take one tablet at breakfast ?Metoprolol Suc 100 mg - take one tablet at lunch ?Gabapentin 300 mg - take 2 tablets at lunch and 2 tablets at dinner ?Montelukast 10 mg - take one tablet at bedtime ?Famotidine 20 mg - take one tablet at breakfast and one tablet at  bedtime ?Ezetimibe 10 mg  - take one tablet daily ?Praluent Inj 75 mg - inject 75 mg every 14 days ? ?Patient is due for next adherence delivery on: 11/04/21. ?Called patient and reviewed medications and coordinated delivery. ?Packaging for 30 DS ? ?This delivery to include: ?Famotidine 20 mg - take one tablet at breakfast and one tablet at bedtime ?Rosuvastatin 20 mg - take one tablet at bedtime ?Metoprolol Suc 100 mg - take one tablet at lunch ?Montelukast 10 mg - take one tablet at bedtime ?Gabapentin 300 mg - take 2 tablets at lunch and 2 tablets at dinner ?Ezetimibe 10 mg  - take one tablet daily ?Losartan 100 mg - take one tablet at breakfast ?Praluent Inj 75 mg - inject 75 mg every 14 days ? ?HCTZ, was picked up from [facility] patient reports she has 6 pills left, to be added to delivery ? ?Confirmed delivery date of 11/04/21, advised patient that pharmacy will contact them the morning of delivery.  ? ?Care Gaps: ?COVID Booster - Overdue ?HGB A1C - Overdue ?BP- 122/70 (10/15/21) ?Lab Results  ?Component Value Date  ? HGBA1C 5.9 04/17/2021  ? ? ?Star Rating Drugs: ?Losartan 100 mg - last filled 09/29/2021 30 DS at [facility] ?Rosuvastatin 20 mg - last filled 09/29/2021 30 DS at [facility] ? ? ?Ned Clines CMA ?Clinical Pharmacist Assistant ?[phone] ? ?

## 2021-10-27 ENCOUNTER — Encounter: Payer: Self-pay | Admitting: Physician Assistant

## 2021-10-27 ENCOUNTER — Ambulatory Visit: Payer: Medicare HMO | Admitting: Physician Assistant

## 2021-10-27 VITALS — BP 112/72 | HR 64 | Ht 66.0 in | Wt 222.2 lb

## 2021-10-27 DIAGNOSIS — R195 Other fecal abnormalities: Secondary | ICD-10-CM

## 2021-10-27 NOTE — Patient Instructions (Signed)
Please avoid eating red meat. ? ?If you really want to have a steak or hamburger then try pretreating with Imodium OTC.  Can continue to use this up to 4 times a day for continual diarrhea symptoms. ? ?It was a pleasure seeing you, ?Ellouise Newer, PA-C ?

## 2021-10-27 NOTE — Progress Notes (Signed)
? ?Chief Complaint: loose stools ? ?HPI: ?   [name] is a 76 year old African-American female with a past medical history of anxiety, CAD status post stent in 2018 on Plavix (08/31/2020 echo with LVEF normal at 60-65% and no aortic stenosis), known to Dr. Ardis Hughs, who returns to clinic today with a complaint of loose stools. ?  04/18/2015 colonoscopy Dr. Deatra Ina with sessile polyp in the ascending colon.  Pathology showed tubular adenoma and repeat was recommended in 5 years. ?   05/03/17 patient seen for constipation.  Described having a bowel movement every couple of days and never feeling empty with some pain on her right side as well as bloating.  That time discussed IBS-C possibly after C. difficile earlier that year.  Started the patient on MiraLAX.  Also encouraged fiber and water. ?   12/04/2019 patient seen in clinic and described she continue with constipation and was using MiraLAX as needed.  Also having breakthrough indigestion regardless of Pepcid 20 twice daily.  Describes some dysphagia.  At that time schedule patient for an EGD with dilation and a diagnostic colonoscopy given a change in bowel habits.  She was prescribed Omeprazole 20 mg twice daily and her Famotidine was stopped. ?   02/05/2020 EGD with gastritis and otherwise normal.  Biopsies showed mild chronic gastritis. ?   02/05/2020 colonoscopy with 5 polyps and diverticulosis in the left colon, biopsy showed tubular adenomas and repeat recommended in 3 years. ?   08/19/2021 BMP with a creatinine of 1.62 and a glucose of 101.  At that time her HCTZ was stopped. ?   Today, the patient presents to clinic accompanied by her daughter who tells me she is the one that made her come in.  Patient tells me that she has problems whenever she eats red meat, she will have generalized abdominal discomfort and then loose stools for couple of days and then it goes away on its own.  As long as she avoids red meat she has none of the symptoms, but occasionally she  craves a steak or hamburger and eats one anyways.  Typically has to use MiraLAX daily or every other day in order to keep regular bowel movements. ?   Denies fever, chills, weight loss, blood in her stool or symptoms that awaken her from sleep. ? ?Past Medical History:  ?Diagnosis Date  ? Allergy   ? Anxiety   ? C. difficile diarrhea   ? history of  ? CAD (coronary artery disease), native coronary artery   ? a. 05/2017 showed 100% mLCx tx with DES, otherwise 20% mRCA, 50% ramus, 30% dLAD, 20% prox-mid LAD, 20% mLAD treated medically.   ? Cancer [facility])   ? Right kidney CA removed.   ? CKD (chronic kidney disease) stage 3, GFR 30-59 ml/min ([place]) 10/11/2016  ? s/p R nephrectomy  ? Colon polyps 2008  ? HYPERPLASTIC  ? Diabetes mellitus without complication ([place])   ? type 2  no meds  ? Gastritis   ? GERD (gastroesophageal reflux disease)   ? History of echocardiogram   ? Echo 3/18:  Moderate LVH, EF 16-01, grade 1 diastolic dysfunction, calcified aortic valve, mild MR, moderate LAE  ? History of nuclear stress test   ? Myoview 3/18: Mod size and intensity fixed septal defect, may be artifact.Opposite mod size and intensity lat defect, which is reversible and could represent ischemia or possibly artifact (SDS 4). LVEF 71% with normal wall motion. Intermediate risk study. >> images reviewed with  Dr. Dorris Carnes - no sig ischemia; med rx   ? Hx of cardiovascular stress test   ? Lexiscan Myoview 6/16:  EF 70%, no scar or ischemia; Low Risk  ? Hyperlipidemia   ? Hypertension   ? Neuropathy   ? Orthostatic hypotension 05/28/2017  ? OSA (obstructive sleep apnea) 01/16/2021  ? Osteoarthritis   ? Rheumatoid arthritis ([place])   ? RA (Dr. Ouida Sills) Bilateral hands  ? S/P angioplasty with stent 05/27/17 to LCX with DES  05/28/2017  ? Thyroid disease   ? ? ?Past Surgical History:  ?Procedure Laterality Date  ? ABDOMINAL HYSTERECTOMY  1978  ? ANTERIOR CERVICAL DECOMP/DISCECTOMY FUSION N/A 05/29/2018  ? Procedure: ANTERIOR CERVICAL  DECOMPRESSION FUSION - CERVICAL FIVE-CERVICAL SIX - CERVICAL SIX-CERVICAL SEVEN;  Surgeon: Earnie Larsson, MD;  Location: [place];  Service: Neurosurgery;  Laterality: N/A;  ? BACK SURGERY    ? x 2  ? CARPAL TUNNEL RELEASE Left   ? COLONOSCOPY W/ BIOPSIES AND POLYPECTOMY    ? Hx: of  ? CORONARY STENT INTERVENTION N/A 05/27/2017  ? Procedure: CORONARY STENT INTERVENTION;  Surgeon: Burnell Blanks, MD;  Location: [place] [facility];  Service: Cardiovascular;  Laterality: N/A;  ? ESOPHAGOGASTRODUODENOSCOPY    ? HNP    ? LEFT HEART CATH AND CORONARY ANGIOGRAPHY N/A 05/27/2017  ? Procedure: LEFT HEART CATH AND CORONARY ANGIOGRAPHY;  Surgeon: Burnell Blanks, MD;  Location: [place] [facility];  Service: Cardiovascular;  Laterality: N/A;  ? LUMBAR LAMINECTOMY/DECOMPRESSION MICRODISCECTOMY Left 02/12/2013  ? Procedure: LUMBAR TWO THREE, LUMBAR THREE FOUR, LUMBAR FOUR FIVE  LAMINECTOMY/DECOMPRESSION MICRODISCECTOMY 3 LEVELS;  Surgeon: Charlie Pitter, MD;  Location: [facility];  Service: Neurosurgery;  Laterality: Left;  ? NEPHRECTOMY Right 2010  ? 10.rcc cancer  ? TOTAL KNEE ARTHROPLASTY Right   ? Redo  ? ? ?Current Outpatient Medications  ?Medication Sig Dispense Refill  ? acetaminophen (TYLENOL) 500 MG tablet Take 1,000 mg by mouth as needed for moderate pain or headache.     ? albuterol (VENTOLIN HFA) 108 (90 Base) MCG/ACT inhaler INHALE ONE OR TWO PUFFS BY MOUTH INTO THE LUNGS EVERY SIX HOURS AS NEEDED FOR WHEEZING OR SHORTNESS OF BREATH 8.5 g 12  ? Alirocumab (PRALUENT) 75 MG/ML SOAJ Inject 75 mg into the skin every 14 (fourteen) days. 2 mL 11  ? aspirin 81 MG chewable tablet Chew 81 mg by mouth daily.    ? Continuous Blood Gluc Receiver (FREESTYLE LIBRE 2 READER) DEVI Use to check blood sugar, dx:e11.9 1 each 2  ? Continuous Blood Gluc Sensor (FREESTYLE LIBRE 2 SENSOR) MISC Use to check blood sugar, dx:e11.9 3 each 3  ? cyclobenzaprine (FLEXERIL) 10 MG tablet Take 1 tablet (10 mg total) by mouth 2 (two) times  daily as needed for muscle spasms. 20 tablet 0  ? ezetimibe (ZETIA) 10 MG tablet Take 1 tablet (10 mg total) by mouth daily. 90 tablet 3  ? famotidine (PEPCID) 20 MG tablet TAKE ONE TABLET BY MOUTH EVERY MORNING and TAKE ONE TABLET BY MOUTH EVERYDAY AT BEDTIME 180 tablet 2  ? fluticasone (FLONASE) 50 MCG/ACT nasal spray PLACE 2 SPRAYS INTO BOTH NOSTRILS AT BEDTIME AS NEEDED FOR ALLERGIES. 48 mL 1  ? Fluticasone-Umeclidin-Vilant (TRELEGY ELLIPTA) 100-62.5-25 MCG/INH AEPB Inhale 1 puff into the lungs daily. 60 each 5  ? gabapentin (NEURONTIN) 300 MG capsule Take 2 capsules (600 mg total) by mouth 2 (two) times daily. 360 capsule 4  ? hydrochlorothiazide (HYDRODIURIL) 25 MG tablet Take  1 tablet (25 mg total) by mouth daily. 30 tablet 3  ? losartan (COZAAR) 100 MG tablet Take 1 tablet (100 mg total) by mouth daily. 90 tablet 2  ? metoprolol succinate (TOPROL-XL) 100 MG 24 hr tablet TAKE ONE TABLET BY MOUTH AT NOON 90 tablet 1  ? montelukast (SINGULAIR) 10 MG tablet TAKE ONE TABLET BY MOUTH EVERYDAY AT BEDTIME 30 tablet 3  ? MYRBETRIQ 25 MG TB24 tablet Take 25 mg by mouth daily.    ? nitroGLYCERIN (NITROSTAT) 0.4 MG SL tablet Place 1 tablet (0.4 mg total) under the tongue every 5 (five) minutes as needed for chest pain. 25 tablet 2  ? ondansetron (ZOFRAN-ODT) 4 MG disintegrating tablet '4mg'$  ODT q4 hours prn nausea/vomit 20 tablet 0  ? pantoprazole (PROTONIX) 40 MG tablet Take 1 tablet (40 mg total) by mouth daily. 30 tablet 1  ? polyethylene glycol powder (GLYCOLAX/MIRALAX) powder Take 17 g by mouth daily. 500 g 3  ? rosuvastatin (CRESTOR) 20 MG tablet Take 1 tablet (20 mg total) by mouth daily. 90 tablet 3  ? ?No current facility-administered medications for this visit.  ? ? ?Allergies as of 10/27/2021 - Review Complete 10/21/2021  ?Allergen Reaction Noted  ? Oxycodone-acetaminophen Hives and Itching 11/04/2014  ? Percocet [oxycodone-acetaminophen] Hives, Itching, and Other (See Comments) 11/04/2014  ? Pravastatin sodium  Other (See Comments) 05/03/2017  ? Hydrocodone Other (See Comments) 12/04/2014  ? Aspirin Other (See Comments)   ? ? ?Family History  ?Problem Relation Age of Onset  ? Rheum arthritis Mother   ? Stroke Mother

## 2021-10-28 NOTE — Progress Notes (Signed)
I agree with the above note, plan 

## 2021-10-31 DIAGNOSIS — G4733 Obstructive sleep apnea (adult) (pediatric): Secondary | ICD-10-CM | POA: Diagnosis not present

## 2021-11-05 ENCOUNTER — Telehealth: Payer: Self-pay

## 2021-11-05 ENCOUNTER — Ambulatory Visit (HOSPITAL_COMMUNITY)
Admission: EM | Admit: 2021-11-05 | Discharge: 2021-11-05 | Disposition: A | Discharge status: Home / Self Care | Payer: Medicare HMO | Attending: Family Medicine | Admitting: Family Medicine

## 2021-11-05 ENCOUNTER — Ambulatory Visit (INDEPENDENT_AMBULATORY_CARE_PROVIDER_SITE_OTHER): Payer: Medicare HMO

## 2021-11-05 ENCOUNTER — Encounter (HOSPITAL_COMMUNITY): Payer: Self-pay | Admitting: Emergency Medicine

## 2021-11-05 DIAGNOSIS — R3915 Urgency of urination: Secondary | ICD-10-CM | POA: Insufficient documentation

## 2021-11-05 DIAGNOSIS — I119 Hypertensive heart disease without heart failure: Secondary | ICD-10-CM | POA: Insufficient documentation

## 2021-11-05 DIAGNOSIS — E1122 Type 2 diabetes mellitus with diabetic chronic kidney disease: Secondary | ICD-10-CM | POA: Insufficient documentation

## 2021-11-05 DIAGNOSIS — R6883 Chills (without fever): Secondary | ICD-10-CM | POA: Diagnosis not present

## 2021-11-05 DIAGNOSIS — R61 Generalized hyperhidrosis: Secondary | ICD-10-CM | POA: Diagnosis not present

## 2021-11-05 DIAGNOSIS — R350 Frequency of micturition: Secondary | ICD-10-CM | POA: Diagnosis not present

## 2021-11-05 DIAGNOSIS — J449 Chronic obstructive pulmonary disease, unspecified: Secondary | ICD-10-CM | POA: Insufficient documentation

## 2021-11-05 DIAGNOSIS — Z20822 Contact with and (suspected) exposure to covid-19: Secondary | ICD-10-CM | POA: Diagnosis not present

## 2021-11-05 DIAGNOSIS — I2510 Atherosclerotic heart disease of native coronary artery without angina pectoris: Secondary | ICD-10-CM | POA: Diagnosis not present

## 2021-11-05 DIAGNOSIS — R0600 Dyspnea, unspecified: Secondary | ICD-10-CM

## 2021-11-05 DIAGNOSIS — I129 Hypertensive chronic kidney disease with stage 1 through stage 4 chronic kidney disease, or unspecified chronic kidney disease: Secondary | ICD-10-CM | POA: Insufficient documentation

## 2021-11-05 DIAGNOSIS — Z7982 Long term (current) use of aspirin: Secondary | ICD-10-CM | POA: Insufficient documentation

## 2021-11-05 DIAGNOSIS — N183 Chronic kidney disease, stage 3 unspecified: Secondary | ICD-10-CM | POA: Diagnosis not present

## 2021-11-05 LAB — POCT URINALYSIS DIPSTICK, ED / UC
Bilirubin Urine: NEGATIVE
Glucose, UA: NEGATIVE mg/dL
Hgb urine dipstick: NEGATIVE
Ketones, ur: NEGATIVE mg/dL
Nitrite: NEGATIVE
Protein, ur: NEGATIVE mg/dL
Specific Gravity, Urine: 1.015 (ref 1.005–1.030)
Urobilinogen, UA: 1 mg/dL (ref 0.0–1.0)
pH: 6.5 (ref 5.0–8.0)

## 2021-11-05 LAB — COMPREHENSIVE METABOLIC PANEL
ALT: 17 U/L (ref 0–44)
AST: 29 U/L (ref 15–41)
Albumin: 3.7 g/dL (ref 3.5–5.0)
Alkaline Phosphatase: 57 U/L (ref 38–126)
Anion gap: 6 (ref 5–15)
BUN: 19 mg/dL (ref 8–23)
CO2: 28 mmol/L (ref 22–32)
Calcium: 9.6 mg/dL (ref 8.9–10.3)
Chloride: 103 mmol/L (ref 98–111)
Creatinine, Ser: 1.7 mg/dL — ABNORMAL HIGH (ref 0.44–1.00)
GFR, Estimated: 31 mL/min — ABNORMAL LOW (ref >60)
Glucose, Bld: 92 mg/dL (ref 70–99)
Potassium: 4.2 mmol/L (ref 3.5–5.1)
Sodium: 137 mmol/L (ref 135–145)
Total Bilirubin: 0.5 mg/dL (ref 0.3–1.2)
Total Protein: 8.1 g/dL (ref 6.5–8.1)

## 2021-11-05 LAB — CBC WITH DIFFERENTIAL/PLATELET
Abs Immature Granulocytes: 0.02 10*3/uL (ref 0.00–0.07)
Basophils Absolute: 0.1 10*3/uL (ref 0.0–0.1)
Basophils Relative: 1 %
Eosinophils Absolute: 0.3 10*3/uL (ref 0.0–0.5)
Eosinophils Relative: 3 %
HCT: 45.3 % (ref 36.0–46.0)
Hemoglobin: 15.1 g/dL — ABNORMAL HIGH (ref 12.0–15.0)
Immature Granulocytes: 0 %
Lymphocytes Relative: 44 %
Lymphs Abs: 3.3 10*3/uL (ref 0.7–4.0)
MCH: 30.4 pg (ref 26.0–34.0)
MCHC: 33.3 g/dL (ref 30.0–36.0)
MCV: 91.1 fL (ref 80.0–100.0)
Monocytes Absolute: 1.1 10*3/uL — ABNORMAL HIGH (ref 0.1–1.0)
Monocytes Relative: 15 %
Neutro Abs: 2.8 10*3/uL (ref 1.7–7.7)
Neutrophils Relative %: 37 %
Platelets: 230 10*3/uL (ref 150–400)
RBC: 4.97 MIL/uL (ref 3.87–5.11)
RDW: 14.9 % (ref 11.5–15.5)
WBC: 7.5 10*3/uL (ref 4.0–10.5)
nRBC: 0 % (ref 0.0–0.2)

## 2021-11-05 NOTE — ED Provider Notes (Signed)
?[place] ? ? ? ?CSN: [id number] ?Arrival date & time: 11/05/21  1407 ? ? ?  ? ?History   ?Chief Complaint ?Chief Complaint  ?Patient presents with  ? Excessive Sweating  ? ? ?HPI ?[name] is a 76 y.o. female.  ? ?HPI ?Here for a 2-day history of waxing and waning diaphoresis and feeling clammy and may be chilled.  Often she has felt hot through this time 2.  She has felt some malaise, and may be some soreness in her sides.  She has not had much appetite, but no vomiting or diarrhea that is new.  About 2 weeks ago her Myrbetriq was increased from 25 to 50 mg ? ?On the evening of Tuesday she did notice she had a little dyspnea and felt heavy in her chest; nitroglycerin did help that.  She has not really had any chest symptoms since then. ? ?No dysuria or hematuria. ? ?She does have diabetes, but does not take any medicines for it ? ?She does have a history of coronary artery disease and COPD ? ?Past Medical History:  ?Diagnosis Date  ? Allergy   ? Anxiety   ? C. difficile diarrhea   ? history of  ? CAD (coronary artery disease), native coronary artery   ? a. 05/2017 showed 100% mLCx tx with DES, otherwise 20% mRCA, 50% ramus, 30% dLAD, 20% prox-mid LAD, 20% mLAD treated medically.   ? Cancer [facility])   ? Right kidney CA removed.   ? CKD (chronic kidney disease) stage 3, GFR 30-59 ml/min ([place]) 10/11/2016  ? s/p R nephrectomy  ? Colon polyps 2008  ? HYPERPLASTIC  ? Diabetes mellitus without complication ([place])   ? type 2  no meds  ? Gastritis   ? GERD (gastroesophageal reflux disease)   ? History of echocardiogram   ? Echo 3/18:  Moderate LVH, EF 73-22, grade 1 diastolic dysfunction, calcified aortic valve, mild MR, moderate LAE  ? History of nuclear stress test   ? Myoview 3/18: Mod size and intensity fixed septal defect, may be artifact.Opposite mod size and intensity lat defect, which is reversible and could represent ischemia or possibly artifact (SDS 4). LVEF 71% with normal wall motion. Intermediate  risk study. >> images reviewed with Dr. Dorris Carnes - no sig ischemia; med rx   ? Hx of cardiovascular stress test   ? Lexiscan Myoview 6/16:  EF 70%, no scar or ischemia; Low Risk  ? Hyperlipidemia   ? Hypertension   ? Neuropathy   ? Orthostatic hypotension 05/28/2017  ? OSA (obstructive sleep apnea) 01/16/2021  ? Osteoarthritis   ? Rheumatoid arthritis ([place])   ? RA (Dr. Ouida Sills) Bilateral hands  ? S/P angioplasty with stent 05/27/17 to LCX with DES  05/28/2017  ? Sleep apnea 2023  ? Thyroid disease   ? ? ?Patient Active Problem List  ? Diagnosis Date Noted  ? OSA (obstructive sleep apnea) 01/16/2021  ? COPD mixed type ([place]) 01/16/2021  ? Fatigue 11/03/2020  ? B12 deficiency 11/03/2020  ? Chest pain 08/30/2020  ? Malignant neoplasm of kidney excluding renal pelvis, unspecified laterality ([place]) 09/26/2019  ? Vitamin D deficiency, unspecified 09/26/2019  ? Anxiety disorder, unspecified 03/26/2019  ? Tobacco use disorder 09/29/2018  ? Cervical myelopathy ([place]) 05/29/2018  ? ANA positive 12/08/2017  ? Cervicalgia 09/16/2017  ? Carpal tunnel syndrome of left wrist 07/21/2017  ? Chondrocalcinosis 07/21/2017  ? Neuropathy 07/21/2017  ? Orthostatic hypotension 05/28/2017  ? S/P angioplasty with stent  05/27/17 to LCX with DES  05/28/2017  ? CAD (coronary artery disease)   ? Polyneuropathy 05/25/2017  ? Chest pain with moderate risk for cardiac etiology 05/25/2017  ? Class 1 obesity with serious comorbidity and body mass index (BMI) of 34.0 to 34.9 in adult 12/06/2016  ? CKD (chronic kidney disease) stage 3, GFR 30-59 ml/min ([facility]) 10/11/2016  ? Hot flashes, menopausal 10/01/2016  ? Chest pain with moderate risk of acute coronary syndrome 10/25/2014  ? Degenerative spondylolisthesis 10/15/2014  ? Spinal stenosis, lumbar region, with neurogenic claudication 02/12/2013  ? DM (diabetes mellitus), type 2 with renal complications ([place]) 34/74/2595  ? History of Rtr nephrectomy 2010, secondary to renal cell cancer 01/14/2009  ?  PERSONAL HX COLONIC POLYPS 11/14/2008  ? Hyperlipidemia associated with type 2 diabetes mellitus ([place]) 11/16/2007  ? ESOPHAGEAL STRICTURE 10/12/2007  ? Rheumatoid arthritis ([place]) 02/27/2007  ? Hypertension with heart disease 01/19/2007  ? Allergic rhinitis 01/19/2007  ? GERD 01/19/2007  ? ? ?Past Surgical History:  ?Procedure Laterality Date  ? ABDOMINAL HYSTERECTOMY  1978  ? ANTERIOR CERVICAL DECOMP/DISCECTOMY FUSION N/A 05/29/2018  ? Procedure: ANTERIOR CERVICAL DECOMPRESSION FUSION - CERVICAL FIVE-CERVICAL SIX - CERVICAL SIX-CERVICAL SEVEN;  Surgeon: Earnie Larsson, MD;  Location: [facility];  Service: Neurosurgery;  Laterality: N/A;  ? BACK SURGERY    ? x 2  ? CARPAL TUNNEL RELEASE Left   ? COLONOSCOPY W/ BIOPSIES AND POLYPECTOMY    ? Hx: of  ? CORONARY STENT INTERVENTION N/A 05/27/2017  ? Procedure: CORONARY STENT INTERVENTION;  Surgeon: Burnell Blanks, MD;  Location: [place] [facility];  Service: Cardiovascular;  Laterality: N/A;  ? ESOPHAGOGASTRODUODENOSCOPY    ? HNP    ? LEFT HEART CATH AND CORONARY ANGIOGRAPHY N/A 05/27/2017  ? Procedure: LEFT HEART CATH AND CORONARY ANGIOGRAPHY;  Surgeon: Burnell Blanks, MD;  Location: [place] [facility];  Service: Cardiovascular;  Laterality: N/A;  ? LUMBAR LAMINECTOMY/DECOMPRESSION MICRODISCECTOMY Left 02/12/2013  ? Procedure: LUMBAR TWO THREE, LUMBAR THREE FOUR, LUMBAR FOUR FIVE  LAMINECTOMY/DECOMPRESSION MICRODISCECTOMY 3 LEVELS;  Surgeon: Charlie Pitter, MD;  Location: [place] [facility];  Service: Neurosurgery;  Laterality: Left;  ? NEPHRECTOMY Right 2010  ? 10.rcc cancer  ? TOTAL KNEE ARTHROPLASTY Right   ? Redo  ? ? ?OB History   ?No obstetric history on file. ?  ? ? ? ?Home Medications   ? ?Prior to Admission medications   ?Medication Sig Start Date End Date Taking? Authorizing Provider  ?acetaminophen (TYLENOL) 500 MG tablet Take 1,000 mg by mouth as needed for moderate pain or headache.     [provider]  ?albuterol (VENTOLIN HFA) 108 (90 Base)  MCG/ACT inhaler INHALE ONE OR TWO PUFFS BY MOUTH INTO THE LUNGS EVERY SIX HOURS AS NEEDED FOR WHEEZING OR SHORTNESS OF BREATH 09/07/21   Young, Tarri Fuller D, MD  ?Alirocumab (PRALUENT) 75 MG/ML SOAJ Inject 75 mg into the skin every 14 (fourteen) days. 09/21/21   Fay Records, MD  ?aspirin 81 MG chewable tablet Chew 81 mg by mouth daily.    [provider]  ?Continuous Blood Gluc Receiver (FREESTYLE LIBRE 2 READER) DEVI Use to check blood sugar, dx:e11.9 04/21/21   [name], [name], MD  ?Continuous Blood Gluc Sensor (FREESTYLE LIBRE 2 SENSOR) MISC Use to check blood sugar, dx:e11.9 04/21/21   [name], [name], MD  ?ezetimibe (ZETIA) 10 MG tablet Take 1 tablet (10 mg total) by mouth daily. 05/26/21   Fay Records, MD  ?famotidine (PEPCID)  20 MG tablet TAKE ONE TABLET BY MOUTH EVERY MORNING and TAKE ONE TABLET BY MOUTH EVERYDAY AT BEDTIME 09/23/21   [name], [name], MD  ?fluticasone (FLONASE) 50 MCG/ACT nasal spray PLACE 2 SPRAYS INTO BOTH NOSTRILS AT BEDTIME AS NEEDED FOR ALLERGIES. 05/11/21   [name], [name], MD  ?Fluticasone-Umeclidin-Vilant (TRELEGY ELLIPTA) 100-62.5-25 MCG/INH AEPB Inhale 1 puff into the lungs daily. 05/04/21   Baird [name] D, MD  ?gabapentin (NEURONTIN) 300 MG capsule Take 2 capsules (600 mg total) by mouth 2 (two) times daily. 05/13/21 08/06/22  [place], Earlean Polka, MD  ?hydrochlorothiazide (HYDRODIURIL) 25 MG tablet Take 1 tablet (25 mg total) by mouth daily. 09/30/21   [name], [name], MD  ?losartan (COZAAR) 100 MG tablet Take 1 tablet (100 mg total) by mouth daily. 05/25/21   [name], [name], MD  ?metoprolol succinate (TOPROL-XL) 100 MG 24 hr tablet TAKE ONE TABLET BY MOUTH AT NOON 10/23/21   [name], [name], MD  ?montelukast (SINGULAIR) 10 MG tablet TAKE ONE TABLET BY MOUTH EVERYDAY AT BEDTIME 07/28/21   [name], [name], MD  ?MYRBETRIQ 25 MG TB24 tablet Take 25 mg by mouth daily. 04/22/20   [provider]  ?nitroGLYCERIN (NITROSTAT) 0.4 MG SL tablet Place 1 tablet (0.4 mg total) under  the tongue every 5 (five) minutes as needed for chest pain. 05/26/21   Fay Records, MD  ?ondansetron (ZOFRAN-ODT) 4 MG disintegrating tablet '4mg'$  ODT q4 hours prn nausea/vomit 08/01/21   Deno Etienne, DO  ?pantopr

## 2021-11-05 NOTE — Discharge Instructions (Addendum)
EKG is stable ?Chest x-ray is clear ?Urinalysis looks pretty benign; urine culture will be sent. ? ?We are drawing a blood count and a chemistry panel today; staff will call you if anything is abnormal and needs any definitive treatment ? ? ?You have been swabbed for COVID, and the test will result in the next 24 hours. Our staff will call you if positive. If the test is positive, you should quarantine for 5 days.  ?

## 2021-11-05 NOTE — Telephone Encounter (Signed)
---  caller states she has been having hot flashes and ?chills. getting hot and clammy for 2 days. yesterday ?had temp 99 forehead. has cough, loss of appetite ? ?11/05/2021 11:11:15 AM SEE PCP WITHIN 3 DAYS [place], RN, Estill Bamberg ? ? ?Comments ?User: Rozelle Logan, RN Date/Time Eilene Ghazi Time): 11/05/2021 11:03:24 AM ?has COPD ? ?User: Rozelle Logan, RN Date/Time Eilene Ghazi Time): 11/05/2021 11:10:01 AM ?hydrochlorothiazide '25mg'$  losartan ? ?Referrals ?REFERRED TO PCP OFFICE ? ?11/05/21 1220: Offered pt an appt with PCP for Monday. Pt declines & states she is going to UC. ? ? ?

## 2021-11-05 NOTE — ED Triage Notes (Signed)
Pt reports woke up Wed during the night with excessive sweating and chills. Reports had some other sweats throughout the day as well.  ?Reports feels "bad", soreness in bilat sides. Reports today has had loose stools. Appetite has been down.  ?Pt reports had increase in dosage of Myrbetriw from 25 to '50mg'$ .  ?Yesterday has some fevers.  ?Reports Monday or Tuesday reports her heart was racing and used her Nitro once and helped.  ?

## 2021-11-06 LAB — SARS CORONAVIRUS 2 (TAT 6-24 HRS): SARS Coronavirus 2: NEGATIVE

## 2021-11-07 LAB — URINE CULTURE

## 2021-11-12 ENCOUNTER — Other Ambulatory Visit: Payer: Self-pay | Admitting: Family Medicine

## 2021-11-12 DIAGNOSIS — J3089 Other allergic rhinitis: Secondary | ICD-10-CM

## 2021-11-23 ENCOUNTER — Telehealth: Payer: Self-pay | Admitting: Pharmacist

## 2021-11-23 ENCOUNTER — Other Ambulatory Visit: Payer: Self-pay | Admitting: Family Medicine

## 2021-11-23 DIAGNOSIS — J301 Allergic rhinitis due to pollen: Secondary | ICD-10-CM

## 2021-11-23 NOTE — Chronic Care Management (AMB) (Signed)
? ? ?Chronic Care Management ?Pharmacy Assistant  ? ?Name: [name]  MRN: [id number] DOB: 1945-08-08 ? ?Reason for Encounter: Disease State and Medication Review / Medication Coordination and Hypertension Assessment Call ?  ?Conditions to be addressed/monitored: ?HTN ? ?Recent office visits:  ?None ? ?Recent consult visits:  ?10/27/2021 Ellouise Newer PA (GI) - Patient was seen for loose stools. No medication changes. Advised she can pretreat steak or hamburger with Imodium OTC. No follow up noted.  ? ?Hospital visits:  ?Patient was seen [facility] on 11/05/2021 (1 hour) due to diaphoresis.  ?New?Medications Started at [facility] Discharge:?? ?No new medications started ?Medication Changes at Hospital Discharge: ?No medication changes ?Medications Discontinued at Hospital Discharge: ?Discontinued Flexeril ?Medications that remain the same after Hospital Discharge:??  ?-All other medications will remain the same.   ? ?Medications: ?Outpatient Encounter Medications as of 11/23/2021  ?Medication Sig Note  ? acetaminophen (TYLENOL) 500 MG tablet Take 1,000 mg by mouth as needed for moderate pain or headache.  05/19/2021: prn  ? albuterol (VENTOLIN HFA) 108 (90 Base) MCG/ACT inhaler INHALE ONE OR TWO PUFFS BY MOUTH INTO THE LUNGS EVERY SIX HOURS AS NEEDED FOR WHEEZING OR SHORTNESS OF BREATH   ? Alirocumab (PRALUENT) 75 MG/ML SOAJ Inject 75 mg into the skin every 14 (fourteen) days.   ? aspirin 81 MG chewable tablet Chew 81 mg by mouth daily.   ? Continuous Blood Gluc Receiver (FREESTYLE LIBRE 2 READER) DEVI Use to check blood sugar, dx:e11.9   ? Continuous Blood Gluc Sensor (FREESTYLE LIBRE 2 SENSOR) MISC Use to check blood sugar, dx:e11.9   ? ezetimibe (ZETIA) 10 MG tablet Take 1 tablet (10 mg total) by mouth daily.   ? famotidine (PEPCID) 20 MG tablet TAKE ONE TABLET BY MOUTH EVERY MORNING and TAKE ONE TABLET BY MOUTH EVERYDAY AT BEDTIME   ? fluticasone (FLONASE) 50 MCG/ACT nasal spray PLACE 2 SPRAYS  INTO BOTH NOSTRILS AT BEDTIME AS NEEDED FOR ALLERGIES.   ? Fluticasone-Umeclidin-Vilant (TRELEGY ELLIPTA) 100-62.5-25 MCG/INH AEPB Inhale 1 puff into the lungs daily.   ? gabapentin (NEURONTIN) 300 MG capsule Take 2 capsules (600 mg total) by mouth 2 (two) times daily.   ? hydrochlorothiazide (HYDRODIURIL) 25 MG tablet Take 1 tablet (25 mg total) by mouth daily.   ? losartan (COZAAR) 100 MG tablet Take 1 tablet (100 mg total) by mouth daily.   ? metoprolol succinate (TOPROL-XL) 100 MG 24 hr tablet TAKE ONE TABLET BY MOUTH AT NOON   ? montelukast (SINGULAIR) 10 MG tablet TAKE ONE TABLET BY MOUTH EVERYDAY AT BEDTIME   ? MYRBETRIQ 25 MG TB24 tablet Take 25 mg by mouth daily. 05/19/2021: Patient was taking but in the donut hole with insurance,too $ high  ? nitroGLYCERIN (NITROSTAT) 0.4 MG SL tablet Place 1 tablet (0.4 mg total) under the tongue every 5 (five) minutes as needed for chest pain.   ? ondansetron (ZOFRAN-ODT) 4 MG disintegrating tablet '4mg'$  ODT q4 hours prn nausea/vomit   ? pantoprazole (PROTONIX) 40 MG tablet Take 1 tablet (40 mg total) by mouth daily.   ? polyethylene glycol powder (GLYCOLAX/MIRALAX) powder Take 17 g by mouth daily.   ? rosuvastatin (CRESTOR) 20 MG tablet Take 1 tablet (20 mg total) by mouth daily.   ? ?No facility-administered encounter medications on file as of 11/23/2021.  ? ?Reviewed chart prior to disease state call. Spoke with patient regarding BP ? ?Recent Office Vitals: ?BP Readings from Last 3 Encounters:  ?11/05/21 128/82  ?  10/27/21 112/72  ?10/15/21 122/70  ? ?Pulse Readings from Last 3 Encounters:  ?11/05/21 66  ?10/27/21 64  ?10/15/21 64  ?  ?Wt Readings from Last 3 Encounters:  ?10/27/21 222 lb 3.2 oz (100.8 kg)  ?10/15/21 222 lb 6.4 oz (100.9 kg)  ?09/07/21 224 lb 3.2 oz (101.7 kg)  ?  ? ?Kidney Function ?Lab Results  ?Component Value Date/Time  ? CREATININE 1.70 (H) 11/05/2021 04:41 PM  ? CREATININE 1.62 (H) 08/19/2021 10:56 AM  ? CREATININE 1.47 (H) 07/07/2020 11:04 AM  ?  CREATININE 1.18 (H) 10/01/2016 04:22 PM  ? GFR 30.44 (L) 08/19/2021 10:56 AM  ? GFRNONAA 31 (L) 11/05/2021 04:41 PM  ? GFRNONAA 34 (L) 07/07/2020 11:04 AM  ? GFRAA 40 (L) 07/07/2020 11:04 AM  ? ? ? ?  Latest Ref Rng & Units 11/05/2021  ?  4:41 PM 08/19/2021  ? 10:56 AM 05/19/2021  ?  1:04 PM  ?BMP  ?Glucose 70 - 99 mg/dL 92   101   153    ?BUN 8 - 23 mg/dL '19   22   23    '$ ?Creatinine 0.44 - 1.00 mg/dL 1.70   1.62   1.47    ?Sodium 135 - 145 mmol/L 137   139   139    ?Potassium 3.5 - 5.1 mmol/L 4.2   3.7   3.8    ?Chloride 98 - 111 mmol/L 103   104   102    ?CO2 22 - 32 mmol/L '28   27   26    '$ ?Calcium 8.9 - 10.3 mg/dL 9.6   9.8   10.0    ? ? ?BP Readings from Last 3 Encounters:  ?11/05/21 128/82  ?10/27/21 112/72  ?10/15/21 122/70  ?  ?Lab Results  ?Component Value Date  ? HGBA1C 5.9 04/17/2021  ?  ? ?Patient obtains medications through Adherence Packaging  30 Days  ?  ?Last adherence delivery included:  ?Famotidine 20 mg - take one tablet at breakfast and one tablet at bedtime ?Rosuvastatin 20 mg - take one tablet at bedtime ?Metoprolol Suc 100 mg - take one tablet at lunch ?Montelukast 10 mg - take one tablet at bedtime ?Gabapentin 300 mg - take 2 tablets at lunch and 2 tablets at dinner ?Ezetimibe 10 mg  - take one tablet daily ?Losartan 100 mg - take one tablet at breakfast ?Praluent Inj 75 mg - inject 75 mg every 14 days ?  ?Patient declined the following medications:  No medications were denied ?  ?  ?  ?Patient is due for next adherence delivery on: 12/03/2021 ?  ?Called patient and reviewed medications and coordinated delivery. ?  ?This delivery to include: ?Rosuvastatin 20 mg - take one tablet at bedtime ?Losartan 100 mg - take one tablet at breakfast ?Metoprolol Suc 100 mg - take one tablet at bedtime ?Gabapentin 300 mg - take 2 tablets at breakfast and 2 tablets at dinner ?Montelukast 10 mg - take one tablet at bedtime ?Famotidine 20 mg - take one tablet at breakfast and one tablet at bedtime ?Ezetimibe 10 mg   - take one tablet daily ?Praluent Inj 75 mg - inject 75 mg every 14 days ?  ?Patient will need a short fill: No short fills needed ?  ?Coordinated acute fill: No acute fills needed ?  ?Patient declined the following medications:  No medications declined. ?  ?Confirmed delivery date 12/03/2021, advised patient that pharmacy will contact them the morning of delivery. ? ?  ?  Current antihypertensive regimen:  ?Losartan 100 mg daily ?HCTZ 25 mg daily ?Metoprolol 100 mg daily ? ?How often are you checking your Blood Pressure? Patient is checking blood pressures twice weekly ? ?Current home BP readings: Patient states her last reading was 120/69 on 11/19/21.  ? ?What recent interventions/DTPs have been made by any provider to improve Blood Pressure control since last CPP Visit: No recent interventions have been made.  ? ?Any recent hospitalizations or ED visits since last visit with CPP? No recent hospital visits. She did have a visit to Urgent Care.  ? ?What diet changes have been made to improve Blood Pressure Control?  ?Patient follows a low sodium no pork diet ?Breakfast  - patient will have what she is [place] for, banana, bagel, PB crackers, bacon tomato sandwich or cereal ?Lunch - patient eats breakfast late and doesn't eat lunch. ?Dinner - patient will have mostly a ground [place] dish and a vegetable. ? ?What exercise is being done to improve your Blood Pressure Control?  ?Patient is out and helping her son who is on dialysis with an amputation. She is doing all her housework.  ? ?Adherence Review: ?Is the patient currently on ACE/ARB medication? Yes ?Does the patient have >5 day gap between last estimated fill dates? No ? ?Care Gaps: ?AWV - completed 08/27/2021 ?Last BP - 112/72 on 10/27/2021 ?Last A1C was 5.9 on 04/17/2021 ?Covid - overdue ?HGA1C - overdue ? ?Star Rating Drugs: ?Losartan 100 mg - last filled 10/30/2021 30 DS at [facility] ?Rosuvastatin 20 mg - last filled 10/30/2021 30 DS at [facility] ? ?Gennie Alma CMA  ?Clinical Pharmacist Assistant ?[phone] ? ?

## 2021-11-24 NOTE — Progress Notes (Signed)
? ? ?ACUTE VISIT ?Chief Complaint  ?Patient presents with  ? Depression  ? ?HPI: ?[name] is a 76 y.o. female, who is here today complaining of depressed mood and crying spells. ?Problem has been worse for the past few weeks. ?She was last seen on 08/19/21 for ED follow up. ?No new problems since her last visit. ? ?Depression ?       This is a recurrent problem.  The current episode started 1 to 4 weeks ago.   The onset quality is gradual.   The problem occurs intermittently.  Associated symptoms include fatigue.  Associated symptoms include no appetite change and no headaches.     The symptoms are aggravated by family issues.  Past treatments include SNRIs - Serotonin and norepinephrine reuptake inhibitors.  Risk factors include family history.   Past medical history includes chronic pain, chronic illness and anxiety.   ?A few events have aggravated problem. ?Her daughter has hx of bipolar disorder and recently had an exacerbation,quit her job. She is trying to go back and has an interview.  ?Her son is on disability due to chronic medical conditions that include ESRD on dialysis and PAD s/p LE amputation, he has had some complications lately. Recommended having finger amputation because chronic osteomyelitis, he has been reluctant to do so, finally agreed with procedure. At this time he is in the ED waiting to be seen because severe pain. ? ?Yesterday her nephew visited,she could not stop crying. She has not seen his in a while. Her sister died a few years ago. ? ? ?  Dec 11, 2021  ? 10:20 PM 08/27/2021  ? 11:04 AM 08/27/2021  ? 11:02 AM 08/19/2021  ? 10:01 AM 08/26/2020  ? 10:52 AM  ?Depression screen PHQ 2/9  ?Decreased Interest 1 1 0 1 0  ?Down, Depressed, Hopeless 1 1 0 1 0  ?PHQ - 2 Score 2 2 0 2 0  ?Altered sleeping 3 1 0 1   ?Tired, decreased energy '2 1  1   '$ ?Change in appetite 1 0  1   ?Feeling bad or failure about yourself  1 0  0   ?Trouble concentrating 0 0  0   ?Moving slowly or fidgety/restless 0 0  0    ?Suicidal thoughts 0 0  0   ?PHQ-9 Score 9 4 0 5   ?Difficult doing work/chores Somewhat difficult Not difficult at all  Somewhat difficult   ? ?In the past I have recommended duloxetine, she does not remember why she stopped taking med. ? ? ?  Dec 11, 2021  ? 10:21 PM  ?GAD 7 : Generalized Anxiety Score  ?Nervous, Anxious, on Edge 1  ?Control/stop worrying 0  ?Worry too much - different things 1  ?Trouble relaxing 1  ?Restless 0  ?Easily annoyed or irritable 1  ?Afraid - awful might happen 1  ?Total GAD 7 Score 5  ?Anxiety Difficulty Not difficult at all  ? ?She has not smoked in 3 months, states that she was dx'ed with COPD, so decide to quit smoking. ?OSA: Has not tolerated CPAP mask. She is meeting with her pulmonologist tomorrow to discuss mask options. ? ?Evaluated in the ED on 11/05/21 because excessive sweating, problem resolved after 3 days. ? ?CAD and PAD on Crestor 20 mg daily,Praluent 75 mg q 14 days, and zetia 10 mg daily. ?Follows with cardiologist. ? ?Lab Results  ?Component Value Date  ? CHOL 143 09/11/2020  ? HDL 56 09/11/2020  ? [place] 69 09/11/2020  ?  LDLDIRECT 171.5 12/06/2011  ? TRIG 98 09/11/2020  ? CHOLHDL 2.6 09/11/2020  ? ?Chronic pain: Polyarthralgia and cervical+ lower back pain. ?Cervicalgia, she follows with neurosurgeon, Dr Annette Stable. ? ?Inflammatory arthritis,joint pain did not respond to DMARD's ?Prior Dx of RA, it was determined that joint pain was due to pseudogout and OA instead. ? ?Review of Systems  ?Constitutional:  Positive for fatigue. Negative for activity change, appetite change, fever and unexpected weight change.  ?HENT:  Negative for mouth sores, nosebleeds and sore throat.   ?Respiratory:  Negative for cough, shortness of breath and wheezing.   ?Cardiovascular:  Negative for chest pain, palpitations and leg swelling.  ?Gastrointestinal:  Negative for abdominal pain, nausea and vomiting.  ?     Negative for changes in bowel habits.  ?Musculoskeletal:  Positive for arthralgias,  back pain and neck pain.  ?Skin:  Negative for rash.  ?Neurological:  Negative for syncope, weakness and headaches.  ?Psychiatric/Behavioral:  Positive for depression. Negative for confusion and hallucinations.   ?Rest see pertinent positives and negatives per HPI. ? ?Current Outpatient Medications on File Prior to Visit  ?Medication Sig Dispense Refill  ? acetaminophen (TYLENOL) 500 MG tablet Take 1,000 mg by mouth as needed for moderate pain or headache.     ? albuterol (VENTOLIN HFA) 108 (90 Base) MCG/ACT inhaler INHALE ONE OR TWO PUFFS BY MOUTH INTO THE LUNGS EVERY SIX HOURS AS NEEDED FOR WHEEZING OR SHORTNESS OF BREATH 8.5 g 12  ? Alirocumab (PRALUENT) 75 MG/ML SOAJ Inject 75 mg into the skin every 14 (fourteen) days. 2 mL 11  ? aspirin 81 MG chewable tablet Chew 81 mg by mouth daily.    ? Continuous Blood Gluc Receiver (FREESTYLE LIBRE 2 READER) DEVI Use to check blood sugar, dx:e11.9 1 each 2  ? Continuous Blood Gluc Sensor (FREESTYLE LIBRE 2 SENSOR) MISC Use to check blood sugar, dx:e11.9 3 each 3  ? ezetimibe (ZETIA) 10 MG tablet Take 1 tablet (10 mg total) by mouth daily. 90 tablet 3  ? famotidine (PEPCID) 20 MG tablet TAKE ONE TABLET BY MOUTH EVERY MORNING and TAKE ONE TABLET BY MOUTH EVERYDAY AT BEDTIME 180 tablet 2  ? fluticasone (FLONASE) 50 MCG/ACT nasal spray PLACE 2 SPRAYS INTO BOTH NOSTRILS AT BEDTIME AS NEEDED FOR ALLERGIES. 48 mL 1  ? Fluticasone-Umeclidin-Vilant (TRELEGY ELLIPTA) 100-62.5-25 MCG/INH AEPB Inhale 1 puff into the lungs daily. 60 each 5  ? gabapentin (NEURONTIN) 300 MG capsule Take 2 capsules (600 mg total) by mouth 2 (two) times daily. 360 capsule 4  ? hydrochlorothiazide (HYDRODIURIL) 25 MG tablet Take 1 tablet (25 mg total) by mouth daily. 30 tablet 3  ? losartan (COZAAR) 100 MG tablet Take 1 tablet (100 mg total) by mouth daily. 90 tablet 2  ? metoprolol succinate (TOPROL-XL) 100 MG 24 hr tablet TAKE ONE TABLET BY MOUTH AT NOON 90 tablet 1  ? montelukast (SINGULAIR) 10 MG tablet  TAKE ONE TABLET BY MOUTH EVERYDAY AT BEDTIME 30 tablet 3  ? MYRBETRIQ 50 MG TB24 tablet Take 50 mg by mouth daily.    ? nitroGLYCERIN (NITROSTAT) 0.4 MG SL tablet Place 1 tablet (0.4 mg total) under the tongue every 5 (five) minutes as needed for chest pain. 25 tablet 2  ? ondansetron (ZOFRAN-ODT) 4 MG disintegrating tablet '4mg'$  ODT q4 hours prn nausea/vomit 20 tablet 0  ? polyethylene glycol powder (GLYCOLAX/MIRALAX) powder Take 17 g by mouth daily. 500 g 3  ? rosuvastatin (CRESTOR) 20 MG tablet Take 1 tablet (20  mg total) by mouth daily. 90 tablet 3  ? pantoprazole (PROTONIX) 40 MG tablet Take 1 tablet (40 mg total) by mouth daily. 30 tablet 1  ? ?No current facility-administered medications on file prior to visit.  ? ?Past Medical History:  ?Diagnosis Date  ? Allergy   ? Anxiety   ? C. difficile diarrhea   ? history of  ? CAD (coronary artery disease), native coronary artery   ? a. 05/2017 showed 100% mLCx tx with DES, otherwise 20% mRCA, 50% ramus, 30% dLAD, 20% prox-mid LAD, 20% mLAD treated medically.   ? Cancer [facility])   ? Right kidney CA removed.   ? CKD (chronic kidney disease) stage 3, GFR 30-59 ml/min ([place]) 10/11/2016  ? s/p R nephrectomy  ? Colon polyps 2008  ? HYPERPLASTIC  ? Diabetes mellitus without complication ([place])   ? type 2  no meds  ? Gastritis   ? GERD (gastroesophageal reflux disease)   ? History of echocardiogram   ? Echo 3/18:  Moderate LVH, EF 60-10, grade 1 diastolic dysfunction, calcified aortic valve, mild MR, moderate LAE  ? History of nuclear stress test   ? Myoview 3/18: Mod size and intensity fixed septal defect, may be artifact.Opposite mod size and intensity lat defect, which is reversible and could represent ischemia or possibly artifact (SDS 4). LVEF 71% with normal wall motion. Intermediate risk study. >> images reviewed with Dr. Dorris Carnes - no sig ischemia; med rx   ? Hx of cardiovascular stress test   ? Lexiscan Myoview 6/16:  EF 70%, no scar or ischemia; Low Risk  ?  Hyperlipidemia   ? Hypertension   ? Neuropathy   ? Orthostatic hypotension 05/28/2017  ? OSA (obstructive sleep apnea) 01/16/2021  ? Osteoarthritis   ? Rheumatoid arthritis ([place])   ? RA (Dr. Ouida Sills) Bilateral

## 2021-11-25 ENCOUNTER — Ambulatory Visit (INDEPENDENT_AMBULATORY_CARE_PROVIDER_SITE_OTHER): Payer: Medicare HMO | Admitting: Family Medicine

## 2021-11-25 ENCOUNTER — Encounter: Payer: Self-pay | Admitting: Family Medicine

## 2021-11-25 ENCOUNTER — Ambulatory Visit: Payer: Medicare HMO | Admitting: Family Medicine

## 2021-11-25 VITALS — BP 128/70 | HR 74 | Resp 16 | Ht 66.0 in | Wt 224.2 lb

## 2021-11-25 DIAGNOSIS — G4733 Obstructive sleep apnea (adult) (pediatric): Secondary | ICD-10-CM | POA: Diagnosis not present

## 2021-11-25 DIAGNOSIS — F419 Anxiety disorder, unspecified: Secondary | ICD-10-CM

## 2021-11-25 DIAGNOSIS — I739 Peripheral vascular disease, unspecified: Secondary | ICD-10-CM

## 2021-11-25 DIAGNOSIS — R69 Illness, unspecified: Secondary | ICD-10-CM | POA: Diagnosis not present

## 2021-11-25 DIAGNOSIS — G959 Disease of spinal cord, unspecified: Secondary | ICD-10-CM | POA: Diagnosis not present

## 2021-11-25 DIAGNOSIS — F320 Major depressive disorder, single episode, mild: Secondary | ICD-10-CM | POA: Diagnosis not present

## 2021-11-25 MED ORDER — SERTRALINE HCL 25 MG PO TABS: 25.0000 mg | ORAL_TABLET | Freq: Every day | ORAL | 3 refills | Status: DC

## 2021-11-25 NOTE — Patient Instructions (Addendum)
A few things to remember from today's visit: ? ?If you need refills please call your pharmacy. ?Do not use My Chart to request refills or for acute issues that need immediate attention. ? Today we started Sertraline, this type of medications can increase suicidal risk. This is more prevalent among children,adolecents, and young adults with major depression or other psychiatric disorders. ?It can also make depression worse. ?Most common side effects are gastrointestinal, self limited after a few weeks: diarrhea, nausea, constipation  Or diarrhea among some. ? ?In general it is well tolerated. ?We will follow closely. ? ?Please be sure medication list is accurate. ?If a new problem present, please set up appointment sooner than planned today. ? ?Major Depressive Disorder, Adult ?Major depressive disorder (MDD) is a mental health condition. It may also be called clinical depression or unipolar depression. MDD causes symptoms of sadness, hopelessness, and loss of interest in things. These symptoms last most of the day, almost every day, for 2 weeks. MDD can also cause physical symptoms. It can interfere with relationships and with everyday activities, such as work, school, and activities that are usually pleasant. ?MDD may be mild, moderate, or severe. It may be single-episode MDD, which happens once, or recurrent MDD, which may occur multiple times. ?What are the causes? ?The exact cause of this condition is not known. MDD is most likely caused by a combination of things, which may include: ?Your personality traits. ?Learned or conditioned behaviors or thoughts or feelings that reinforce negativity. ?Any alcohol or substance misuse. ?Long-term (chronic) physical or mental health illness. ?Going through a traumatic experience or major life changes. ?What increases the risk? ?The following factors may make someone more likely to develop MDD: ?A family history of depression. ?Being a woman. ?Troubled family  relationships. ?Abnormally low levels of certain brain chemicals. ?Traumatic or painful events in childhood, especially abuse or loss of a parent. ?A lot of stress from life experiences, such as poor living conditions or discrimination. ?Chronic physical illness or other mental health disorders. ?What are the signs or symptoms? ?The main symptoms of MDD usually include: ?Constant depressed or irritable mood. ?A loss of interest in things and activities. ?Other symptoms include: ?Sleeping or eating too much or too little. ?Unexplained weight gain or weight loss. ?Tiredness or low energy. ?Being agitated, restless, or weak. ?Feeling hopeless, worthless, or guilty. ?Trouble thinking clearly or making decisions. ?Thoughts of suicide or thoughts of harming others. ?Isolating oneself or avoiding other people or activities. ?Trouble completing tasks, work, or any normal obligations. ?Severe symptoms of this condition may include: ?Psychotic depression.This may include false beliefs, or delusions. It may also include seeing, hearing, tasting, smelling, or feeling things that are not real (hallucinations). ?Chronic depression or persistent depressive disorder. This is low-level depression that lasts for at least 2 years. ?Melancholic depression, or feeling extremely sad and hopeless. ?Catatonic depression, which includes trouble speaking and trouble moving. ?How is this diagnosed? ?This condition may be diagnosed based on: ?Your symptoms. ?Your medical and mental health history. You may be asked questions about your lifestyle, including any drug and alcohol use. ?A physical exam. ?Blood tests to rule out other conditions. ?MDD is confirmed if you have the following symptoms most of the day, nearly every day, in a 2-week period: ?Either a depressed mood or loss of interest. ?At least four other MDD symptoms. ?How is this treated? ?This condition is usually treated by mental health professionals, such as psychologists,  psychiatrists, and clinical social workers. You may  need more than one type of treatment. Treatment may include: ?Psychotherapy, also called talk therapy or counseling. Types of psychotherapy include: ?Cognitive behavioral therapy (CBT). This teaches you to recognize unhealthy feelings, thoughts, and behaviors, and replace them with positive thoughts and actions. ?Interpersonal therapy (IPT). This helps you to improve the way you communicate with others or relate to them. ?Family therapy. This treatment includes members of your family. ?Medicines to treat anxiety and depression. These medicines help to balance the brain chemicals that affect your emotions. ?Lifestyle changes. You may be asked to: ?Limit alcohol use and avoid drug use. ?Get regular exercise. ?Get plenty of sleep. ?Make healthy eating choices. ?Spend more time outdoors. ?Brain stimulation. This may be done if symptoms are very severe and other treatments have not worked. Examples of this treatment are electroconvulsive therapy and transcranial magnetic stimulation. ?Follow these instructions at home: ?Activity ?Exercise regularly and spend time outdoors. ?Find activities that you enjoy doing, and make time to do them. ?Find healthy ways to manage stress, such as: ?Meditation or deep breathing. ?Spending time in nature. ?Journaling. ?Return to your normal activities as told by your health care provider. Ask your health care provider what activities are safe for you. ?Alcohol and drug use ?If you drink alcohol: ?Limit how much you use to: ?0-1 drink a day for women who are not pregnant. ?0-2 drinks a day for men. ?Be aware of how much alcohol is in your drink. In the [place] one drink equals one 12 oz bottle of beer (355 mL), one 5 oz glass of wine (148 mL), or one 1? oz glass of hard liquor (44 mL). ?Discuss your alcohol use with your health care provider. Alcohol can affect any antidepressant medicines you are taking. ?Discuss any drug use with your  health care provider. ?General instructions ? ?Take over-the-counter and prescription medicines only as told by your health care provider. ?Eat a healthy diet and get plenty of sleep. ?Consider joining a support group. Your health care provider may be able to recommend one. ?Keep all follow-up visits as told by your health care provider. This is important. ?Where to find more information ?National Alliance on Mental Illness: www.nami.org ?[employer] of Mental Health: https://carter.com/ ?Contact a health care provider if: ?Your symptoms get worse. ?You develop new symptoms. ?Get help right away if: ?You self-harm. ?You have serious thoughts about hurting yourself or others. ?You hallucinate. ?If you ever feel like you may hurt yourself or others, or have thoughts about taking your own life, get help right away. Go to your nearest emergency department or: ?Call your local emergency services (911 in the [place] ?Call a suicide crisis helpline, such as the [place] at 506 045 3452 or 988 in the [place]. This is open 24 hours a day in the [place] ?Text the Crisis Text Line at (747) 776-2405 (in the [place].). ?Summary ?Major depressive disorder (MDD) is a mental health condition. MDD causes symptoms of sadness, hopelessness, and loss of interest in things. These symptoms last most of the day, almost every day, for 2 weeks. ?The symptoms of MDD can interfere with relationships and with everyday activities. ?Treatments and support are available for people who develop MDD. You may need more than one type of treatment. ?Get help right away if you have serious thoughts about hurting yourself or others. ?This information is not intended to replace advice given to you by your health care provider. Make sure you discuss any questions you have with your health care provider. ?  Document Revised: 02/11/2021 Document Reviewed: 06/30/2019 ?Elsevier Patient Education ? [place]. ? ? ? ? ? ? ?

## 2021-11-27 NOTE — Chronic Care Management (AMB) (Signed)
Spoke with patient about Myrbetriq being filled through [facility].  Patient would like to have Myrbetriq filled with [facility] in packaging, she takes this in the morning at breakfast. ?

## 2021-11-28 DIAGNOSIS — I739 Peripheral vascular disease, unspecified: Secondary | ICD-10-CM | POA: Insufficient documentation

## 2021-11-30 DIAGNOSIS — G4733 Obstructive sleep apnea (adult) (pediatric): Secondary | ICD-10-CM | POA: Diagnosis not present

## 2021-12-07 ENCOUNTER — Ambulatory Visit: Payer: Medicare HMO

## 2021-12-07 DIAGNOSIS — G4733 Obstructive sleep apnea (adult) (pediatric): Secondary | ICD-10-CM | POA: Diagnosis not present

## 2021-12-11 DIAGNOSIS — G4733 Obstructive sleep apnea (adult) (pediatric): Secondary | ICD-10-CM | POA: Diagnosis not present

## 2021-12-23 ENCOUNTER — Telehealth: Payer: Self-pay | Admitting: Pharmacist

## 2021-12-23 NOTE — Chronic Care Management (AMB) (Unsigned)
Chronic Care Management Pharmacy Assistant   Name: [name]  MRN: [id number] DOB: 1945/12/08  Reason for Encounter: Medication Review / Medication Coordination Call   Conditions to be addressed/monitored: HTN  Recent office visits:  11/25/2021 Rose [name] MD - Patient was seen for anxiety and additional issues. Started Sertraline 25 mg daily. Increased Mirabegron to 50 mg daily. Follow up in 6 weeks.   Recent consult visits:  None  Hospital visits:  None  Medications: Outpatient Encounter Medications as of 12/23/2021  Medication Sig Note   acetaminophen (TYLENOL) 500 MG tablet Take 1,000 mg by mouth as needed for moderate pain or headache.  05/19/2021: prn   albuterol (VENTOLIN HFA) 108 (90 Base) MCG/ACT inhaler INHALE ONE OR TWO PUFFS BY MOUTH INTO THE LUNGS EVERY SIX HOURS AS NEEDED FOR WHEEZING OR SHORTNESS OF BREATH    Alirocumab (PRALUENT) 75 MG/ML SOAJ Inject 75 mg into the skin every 14 (fourteen) days.    aspirin 81 MG chewable tablet Chew 81 mg by mouth daily.    Continuous Blood Gluc Receiver (FREESTYLE LIBRE 2 READER) DEVI Use to check blood sugar, dx:e11.9    Continuous Blood Gluc Sensor (FREESTYLE LIBRE 2 SENSOR) MISC Use to check blood sugar, dx:e11.9    ezetimibe (ZETIA) 10 MG tablet Take 1 tablet (10 mg total) by mouth daily.    famotidine (PEPCID) 20 MG tablet TAKE ONE TABLET BY MOUTH EVERY MORNING and TAKE ONE TABLET BY MOUTH EVERYDAY AT BEDTIME    fluticasone (FLONASE) 50 MCG/ACT nasal spray PLACE 2 SPRAYS INTO BOTH NOSTRILS AT BEDTIME AS NEEDED FOR ALLERGIES.    Fluticasone-Umeclidin-Vilant (TRELEGY ELLIPTA) 100-62.5-25 MCG/INH AEPB Inhale 1 puff into the lungs daily.    gabapentin (NEURONTIN) 300 MG capsule Take 2 capsules (600 mg total) by mouth 2 (two) times daily.    hydrochlorothiazide (HYDRODIURIL) 25 MG tablet Take 1 tablet (25 mg total) by mouth daily.    losartan (COZAAR) 100 MG tablet Take 1 tablet (100 mg total) by mouth daily.    metoprolol  succinate (TOPROL-XL) 100 MG 24 hr tablet TAKE ONE TABLET BY MOUTH AT NOON    montelukast (SINGULAIR) 10 MG tablet TAKE ONE TABLET BY MOUTH EVERYDAY AT BEDTIME    MYRBETRIQ 50 MG TB24 tablet Take 50 mg by mouth daily.    nitroGLYCERIN (NITROSTAT) 0.4 MG SL tablet Place 1 tablet (0.4 mg total) under the tongue every 5 (five) minutes as needed for chest pain.    ondansetron (ZOFRAN-ODT) 4 MG disintegrating tablet '4mg'$  ODT q4 hours prn nausea/vomit    pantoprazole (PROTONIX) 40 MG tablet Take 1 tablet (40 mg total) by mouth daily.    polyethylene glycol powder (GLYCOLAX/MIRALAX) powder Take 17 g by mouth daily.    rosuvastatin (CRESTOR) 20 MG tablet Take 1 tablet (20 mg total) by mouth daily.    sertraline (ZOLOFT) 25 MG tablet Take 1 tablet (25 mg total) by mouth daily.    No facility-administered encounter medications on file as of 12/23/2021.   Reviewed chart for medication changes ahead of medication coordination call.  No OVs, Consults, or hospital visits since last care coordination call/Pharmacist visit. (If appropriate, list visit date, provider name)  No medication changes indicated OR if recent visit, treatment plan here.  BP Readings from Last 3 Encounters:  11/25/21 128/70  11/05/21 128/82  10/27/21 112/72    Lab Results  Component Value Date   HGBA1C 5.9 04/17/2021     Patient obtains medications through Ad[facility] Packaging  30  Days    Last adherence delivery included:  Rosuvastatin 20 mg - take one tablet at bedtime Losartan 100 mg - take one tablet at breakfast Metoprolol Suc 100 mg - take one tablet at bedtime Gabapentin 300 mg - take 2 tablets at breakfast and 2 tablets at dinner Montelukast 10 mg - take one tablet at bedtime Famotidine 20 mg - take one tablet at breakfast and one tablet at bedtime Ezetimibe 10 mg  - take one tablet daily Praluent Inj 75 mg - inject 75 mg every 14 days   Patient declined the following medications:  No medications were denied        Patient is due for next adherence delivery on: 01/04/2022   Called patient and reviewed medications and coordinated delivery.   This delivery to include: Rosuvastatin 20 mg - take one tablet at bedtime Losartan 100 mg - take one tablet at breakfast Metoprolol Suc 100 mg - take one tablet at lunch Gabapentin 300 mg - take 2 tablets at lunch and 2 tablets at bedtime Montelukast 10 mg - take one tablet at bedtime Famotidine 20 mg - take one tablet at breakfast and one tablet at bedtime Ezetimibe 10 mg  - take one tablet at breakfast Praluent Inj 75 mg - inject 75 mg every 14 days HCTZ 25 mg - take one tablet at breakfast Myrbetriq 50 mg - take one tablet at breakfast   Patient will need a short fill: No short fills needed   Coordinated acute fill: No acute fills needed   Patient declined the following medications:  No declined medications.   Confirmed delivery date 01/04/2022, advised patient that pharmacy will contact them the morning of delivery.     Care Gaps: AWV - scheduled 08/30/2022 Last BP - 128/70 on 11/25/2021 Last A1C was 5.9 on 04/17/2021 Shingrix - never done Covid booster - overdue HGA1C - overdue Tdap - overdue  Star Rating Drugs: Losartan 100 mg - last filled 11/27/2021 30 DS at [facility] Rosuvastatin 20 mg  - last filled 11/27/2021 30 DS at [facility] [phone]

## 2021-12-25 ENCOUNTER — Telehealth: Payer: Self-pay

## 2021-12-25 DIAGNOSIS — N183 Chronic kidney disease, stage 3 unspecified: Secondary | ICD-10-CM

## 2021-12-25 DIAGNOSIS — E1169 Type 2 diabetes mellitus with other specified complication: Secondary | ICD-10-CM

## 2021-12-25 NOTE — Telephone Encounter (Signed)
-----   Message from Viona Gilmore, [facility] sent at 12/24/2021  1:15 PM EDT ----- Regarding: CCM referral Hi,  When you get a chance can you put in a CCM referral for Ms. Matchett?  Thank you! Maddie

## 2021-12-31 DIAGNOSIS — G4733 Obstructive sleep apnea (adult) (pediatric): Secondary | ICD-10-CM | POA: Diagnosis not present

## 2022-01-15 DIAGNOSIS — Z1231 Encounter for screening mammogram for malignant neoplasm of breast: Secondary | ICD-10-CM | POA: Diagnosis not present

## 2022-01-16 NOTE — Progress Notes (Signed)
HPI F Smoker (1 pk/ week, 15 pkyrs followed for OSA, Bronchiectasis, complicated by  Orthostasis, HTN, CAD/ stent, Allergic Rhinitis, GERD,  NPSG 10/07/18- AHI 65.8/ hr, desaturation to 89%, body weight 209 lbs HST 02/09/21- AHI 50.5/ hr, desaturation to a nadir of 70%- average 87%, body weight 226 lbs PFT 05/04/21- Mild airtrapping but normal flows w/o resp to BD. DLCO is high for volume. HST 12/07/21- AHI 46.7/ hr, desaturation to 64%(mean 88%) body weight 226 lbs ===============================================================   10/15/21- 38 yoF Smoker (1 pk/ week, 15 pkyrs followed for OSA(failed CPAP), Bronchiectasis, complicated by  Orthostasis, HTN, CAD/ stent, Aortic Atherosclerosis, Allergic Rhinitis, GERD,  Son is [name] -Ventolin hfa, Singulair, Breztri, Flonase,  CPAP auto 4-10/ [facility] Intolerant of CPAP mask- referred for mask fitting by [facility] DME on 2/17 Download-compliance 0%, AHI 1.6/ hr> turned it back in Body weight today-222 lbs Covid vax-2 Phizer Flu vax-had -----Patient states that she was not using CPAP and they made her turn it in because she was not using CPAP. Did not like full face mask.  Quit smoking 5 weeks ago ! Breathing has improved with little cough or wheeze now. I think she is only using a Breztri inhaler from her description, once daily at bedtime. She is willing to update home sleep test to reassess and requalify for CPAP. CXR 09/08/21- IMPRESSION: There are no new infiltrates or signs of pulmonary edema.  01/18/22- 73 yoF former smoker (1 pk/ week, 15 pkyrs followed for OSA(failed CPAP), Bronchiectasis, complicated by  Orthostasis, HTN, CAD/ stent, Aortic Atherosclerosis, Allergic Rhinitis, GERD,  Son is [name] -Ventolin hfa, Singulair,Trelegy 100, Flonase,  CPAP auto 4-10/ [facility] Intolerant of CPAP mask- referred for mask fitting by [facility] DME on 2/17 Download-compliance 0%, AHI 1.6/ hr> turned it back in Body weight today-229  lbs Covid vax-2 Phizer Flu vax-had HST 12/07/21- AHI 46.7/ hr, desaturation to 64%(mean 88%) body weight 226 lbs Recent sleep study reviewed. She is willing to try CPAP again, but does want mask fitting.  Breathing is ok.  CXR 11/05/21- IMPRESSION: No acute appearing airspace opacity.   ROS-see HPI   + = positive Constitutional:    weight loss, night sweats, fevers, chills, fatigue, lassitude. HEENT:    headaches, +difficulty swallowing, tooth/dental problems, sore throat,       sneezing, itching, ear ache, +nasal congestion, post nasal drip, snoring CV:    chest pain, orthopnea, PND, swelling in lower extremities, anasarca,      dizziness, palpitations Resp:   +shortness of breath with exertion or at rest.                +productive cough,   non-productive cough, coughing up of blood.              change in color of mucus.  wheezing.   Skin:    rash or lesions. GI:  No-   heartburn, indigestion, abdominal pain, nausea, vomiting, diarrhea,                 change in bowel habits, loss of appetite GU: dysuria, change in color of urine, no urgency or frequency.   flank pain. MS:   +joint pain, stiffness, decreased range of motion, back pain. Neuro-     +dizzy Psych:  change in mood or affect.  depression or anxiety.   memory loss.  OBJ- Physical Exam General- Alert, Oriented, Affect-appropriate, Distress- none acute, + obese Skin- r+spots of [place]o Lymphadenopathy- none Head- atraumatic  Eyes- Gross vision intact, PERRLA, conjunctivae and secretions clear            Ears- Hearing, canals-normal            Nose- Clear, no-Septal dev, mucus, polyps, erosion, perforation             Throat- Mallampati III, mucosa clear , drainage- none, +tonsils, + teeth Neck- flexible , trachea midline, no stridor , thyroid nl, carotid no bruit Chest - symmetrical excursion , unlabored           Heart/CV- RRR , no murmur , no gallop  , no rub, nl s1 s2                           - JVD- none  , edema- none, stasis changes- none, varices- none           Lung- +clear, wheeze- none, cough- none , dullness-none, rub- none           Chest wall-  Abd-  Br/ Gen/ Rectal- Not done, not indicated Extrem- cyanosis- none, clubbing, none, atrophy- none, strength- nl Neuro- grossly intact to observation

## 2022-01-18 ENCOUNTER — Ambulatory Visit (INDEPENDENT_AMBULATORY_CARE_PROVIDER_SITE_OTHER): Payer: Medicare HMO | Admitting: Internal Medicine

## 2022-01-18 ENCOUNTER — Encounter: Payer: Self-pay | Admitting: Internal Medicine

## 2022-01-18 DIAGNOSIS — G4733 Obstructive sleep apnea (adult) (pediatric): Secondary | ICD-10-CM | POA: Diagnosis not present

## 2022-01-18 DIAGNOSIS — J449 Chronic obstructive pulmonary disease, unspecified: Secondary | ICD-10-CM | POA: Diagnosis not present

## 2022-01-18 NOTE — Patient Instructions (Addendum)
Order- DME new CPAP auto 5-20, mask of choice, humidifier, supplies, AirView/ card  Order- referral to sleep center for mask fitting  Please call us as needed

## 2022-01-18 NOTE — Assessment & Plan Note (Signed)
She remains off of cigarettes and reports breathing comfortably without significant cough or wheeze. Plan-continue current meds.

## 2022-01-20 ENCOUNTER — Other Ambulatory Visit: Payer: Self-pay | Admitting: Family Medicine

## 2022-01-20 NOTE — Progress Notes (Unsigned)
ACUTE VISIT No chief complaint on file.  HPI: [name] is a 76 y.o. female, who is here today complaining of *** HPI  Review of Systems Rest see pertinent positives and negatives per HPI.  Current Outpatient Medications on File Prior to Visit  Medication Sig Dispense Refill  . acetaminophen (TYLENOL) 500 MG tablet Take 1,000 mg by mouth as needed for moderate pain or headache.     . albuterol (VENTOLIN HFA) 108 (90 Base) MCG/ACT inhaler INHALE ONE OR TWO PUFFS BY MOUTH INTO THE LUNGS EVERY SIX HOURS AS NEEDED FOR WHEEZING OR SHORTNESS OF BREATH 8.5 g 12  . Alirocumab (PRALUENT) 75 MG/ML SOAJ Inject 75 mg into the skin every 14 (fourteen) days. 2 mL 11  . aspirin 81 MG chewable tablet Chew 81 mg by mouth daily.    . Continuous Blood Gluc Receiver (FREESTYLE LIBRE 2 READER) DEVI Use to check blood sugar, dx:e11.9 1 each 2  . Continuous Blood Gluc Sensor (FREESTYLE LIBRE 2 SENSOR) MISC Use to check blood sugar, dx:e11.9 3 each 3  . ezetimibe (ZETIA) 10 MG tablet Take 1 tablet (10 mg total) by mouth daily. 90 tablet 3  . famotidine (PEPCID) 20 MG tablet TAKE ONE TABLET BY MOUTH EVERY MORNING and TAKE ONE TABLET BY MOUTH EVERYDAY AT BEDTIME 180 tablet 2  . fluticasone (FLONASE) 50 MCG/ACT nasal spray PLACE 2 SPRAYS INTO BOTH NOSTRILS AT BEDTIME AS NEEDED FOR ALLERGIES. 48 mL 1  . Fluticasone-Umeclidin-Vilant (TRELEGY ELLIPTA) 100-62.5-25 MCG/INH AEPB Inhale 1 puff into the lungs daily. 60 each 5  . gabapentin (NEURONTIN) 300 MG capsule Take 2 capsules (600 mg total) by mouth 2 (two) times daily. 360 capsule 4  . hydrochlorothiazide (HYDRODIURIL) 25 MG tablet TAKE ONE TABLET BY MOUTH ONCE DAILY 90 tablet 2  . losartan (COZAAR) 100 MG tablet TAKE ONE TABLET BY MOUTH EVERY MORNING 90 tablet 2  . metoprolol succinate (TOPROL-XL) 100 MG 24 hr tablet TAKE ONE TABLET BY MOUTH AT NOON 90 tablet 1  . montelukast (SINGULAIR) 10 MG tablet TAKE ONE TABLET BY MOUTH EVERYDAY AT BEDTIME 30 tablet 3   . MYRBETRIQ 50 MG TB24 tablet Take 50 mg by mouth daily.    . nitroGLYCERIN (NITROSTAT) 0.4 MG SL tablet Place 1 tablet (0.4 mg total) under the tongue every 5 (five) minutes as needed for chest pain. 25 tablet 2  . ondansetron (ZOFRAN-ODT) 4 MG disintegrating tablet '4mg'$  ODT q4 hours prn nausea/vomit 20 tablet 0  . pantoprazole (PROTONIX) 40 MG tablet Take 1 tablet (40 mg total) by mouth daily. 30 tablet 1  . polyethylene glycol powder (GLYCOLAX/MIRALAX) powder Take 17 g by mouth daily. 500 g 3  . rosuvastatin (CRESTOR) 20 MG tablet Take 1 tablet (20 mg total) by mouth daily. 90 tablet 3  . sertraline (ZOLOFT) 25 MG tablet Take 1 tablet (25 mg total) by mouth daily. 30 tablet 3   No current facility-administered medications on file prior to visit.     Past Medical History:  Diagnosis Date  . Allergy   . Anxiety   . C. difficile diarrhea    history of  . CAD (coronary artery disease), native coronary artery    a. 05/2017 showed 100% mLCx tx with DES, otherwise 20% mRCA, 50% ramus, 30% dLAD, 20% prox-mid LAD, 20% mLAD treated medically.   . Cancer ([place])    Right kidney CA removed.   . CKD (chronic kidney disease) stage 3, GFR 30-59 ml/min (HCC) 10/11/2016  s/p R nephrectomy  . Colon polyps 2008   HYPERPLASTIC  . Diabetes mellitus without complication ([place])    type 2  no meds  . Gastritis   . GERD (gastroesophageal reflux disease)   . History of echocardiogram    Echo 3/18:  Moderate LVH, EF 75-17, grade 1 diastolic dysfunction, calcified aortic valve, mild MR, moderate LAE  . History of nuclear stress test    Myoview 3/18: Mod size and intensity fixed septal defect, may be artifact.Opposite mod size and intensity lat defect, which is reversible and could represent ischemia or possibly artifact (SDS 4). LVEF 71% with normal wall motion. Intermediate risk study. >> images reviewed with Dr. Dorris Carnes - no sig ischemia; med rx   . Hx of cardiovascular stress test    Lexiscan Myoview  6/16:  EF 70%, no scar or ischemia; Low Risk  . Hyperlipidemia   . Hypertension   . Neuropathy   . Orthostatic hypotension 05/28/2017  . OSA (obstructive sleep apnea) 01/16/2021  . Osteoarthritis   . Rheumatoid arthritis (HCC)    RA (Dr. Ouida Sills) Bilateral hands  . S/P angioplasty with stent 05/27/17 to LCX with DES  05/28/2017  . Sleep apnea 2023  . Thyroid disease    Allergies  Allergen Reactions  . Oxycodone-Acetaminophen Hives and Itching    hallucinations  . Percocet [Oxycodone-Acetaminophen] Hives, Itching and Other (See Comments)    hallucinations  . Pravastatin Sodium Other (See Comments)    Muscle aches Muscle aches  . Hydrocodone Other (See Comments)    "crazy dreams"  . Aspirin Other (See Comments)    GI upset Other reaction(s): Other (See Comments) REACTION: GI upset    Social History   Socioeconomic History  . Marital status: Divorced    Spouse name: Not on file  . Number of children: 3  . Years of education: 74  . Highest education level: Not on file  Occupational History  . Occupation: Retired    Fish farm manager: RETIRED  Tobacco Use  . Smoking status: Former    Packs/day: 0.20    Years: 15.00    Total pack years: 3.00    Types: Cigarettes    Start date: 29    Quit date: 09/07/2021    Years since quitting: 0.3  . Smokeless tobacco: Never  . Tobacco comments:    pt has been an on and off smoker since age 23  Vaping Use  . Vaping Use: Never used  Substance and Sexual Activity  . Alcohol use: No    Alcohol/week: 0.0 standard drinks of alcohol  . Drug use: No  . Sexual activity: Not on file  Other Topics Concern  . Not on file  Social History Narrative   Single, dgtr lives with her   Current on/off smoker   Alcohol use- no   Drug use-no   Regular Exercise-yes   Social Determinants of Health   Financial Resource Strain: Low Risk  (08/27/2021)   Overall Financial Resource Strain (CARDIA)   . Difficulty of Paying Living Expenses: Not hard at  all  Food Insecurity: No Food Insecurity (08/27/2021)   Hunger Vital Sign   . Worried About Charity fundraiser in the Last Year: Never true   . Ran Out of Food in the Last Year: Never true  Transportation Needs: No Transportation Needs (08/27/2021)   PRAPARE - Transportation   . Lack of Transportation (Medical): No   . Lack of Transportation (Non-Medical): No  Physical Activity: Inactive (08/27/2021)  Exercise Vital Sign   . Days of Exercise per Week: 0 days   . Minutes of Exercise per Session: 0 min  Stress: No Stress Concern Present (08/27/2021)   [place]   . Feeling of Stress : Only a little  Social Connections: Moderately Integrated (08/27/2021)   Social Connection and Isolation Panel [NHANES]   . Frequency of Communication with Friends and Family: More than three times a week   . Frequency of Social Gatherings with Friends and Family: More than three times a week   . Attends Religious Services: More than 4 times per year   . Active Member of Clubs or Organizations: Yes   . Attends Archivist Meetings: More than 4 times per year   . Marital Status: Divorced    There were no vitals filed for this visit. There is no height or weight on file to calculate BMI.  Physical Exam  ASSESSMENT AND PLAN:  There are no diagnoses linked to this encounter.   No follow-ups on file.   [name]. [name], MD  [facility]. [place] office.  Discharge Instructions   None

## 2022-01-21 ENCOUNTER — Telehealth: Payer: Self-pay | Admitting: Pharmacist

## 2022-01-21 NOTE — Chronic Care Management (AMB) (Signed)
    Chronic Care Management Pharmacy Assistant   Name: [name]  MRN: [id number] DOB: [birth year]  01/22/2022 APPOINTMENT REMINDER  [name] was reminded to have all medications, supplements and any blood glucose and blood pressure readings available for review with [name], Pharm. D, at her telephone visit on 01/22/2022 at 12:00.  Care Gaps: AWV - scheduled 08/30/2022 Last BP - 120/70 on 01/18/2022  Last A1C - 5.9 on 04/17/2021 Shingrix - never done Covid booster - overdue HGA1C - overdue Tdap - overdue Mammogram - overdue  Star Rating Drug: Losartan 100 mg - last filled 11/27/2021 30 DS at [facility]  Rosuvastatin 20 mg - last filled 11/27/2021 30 DS at [facility]    Any gaps in medications fill history? These should have delivered on 01/04/22 with [facility] Pharmacist Assistant [phone]

## 2022-01-21 NOTE — Progress Notes (Unsigned)
Chronic Care Management Pharmacy Note  01/21/2022 Name:  [name] MRN:  [id number] DOB:  1946-07-27  Summary: LDL at goal < 70  Recommendations/Changes made from today's visit: -Applied for [employer] for [place] and requested new rx to be sent to the pharmacy -Recommended stepping down to 7 mg for nicotine patches and to not stop after the 14 mg dose -Recommended for pt to try the nicotine 14m lozenges for cravings  Plan: BP assessment in 1-2 months Follow up in 4 months  Subjective: Rose TORNOWis an 76y.o. year old female who is a primary patient of [name] MD.  The CCM team was consulted for assistance with disease management and care coordination needs.    Engaged with patient by telephone for follow up visit in response to provider referral for pharmacy case management and/or care coordination services.   Consent to Services:  The patient was given information about Chronic Care Management services, agreed to services, and gave verbal consent prior to initiation of services.  Please see initial visit note for detailed documentation.   Patient Care Team: JMartinique Rose G, MD as PCP - General (Family Medicine) [name] as PCP - Cardiology (Cardiology) PViona Gilmore [facility] LLCas Pharmacist (Pharmacist)  Recent office visits: 11/25/2021 Rose JMartiniqueMD - Patient was seen for anxiety and additional issues. Started Sertraline 25 mg daily. Increased Mirabegron to 50 mg daily. Follow up in 6 weeks.   08/27/21 BRolene Arbour LPN: Patient presented for AWV.  Recent consult visits: 01/18/2022 Rose Lever MD (Pulmonary Disease) - Patient was seen for obstructive sleep apnea and COPD. Referred to sleep center for mask fitting. Ordered CPAP supplies.  10/27/2021 JEllouise NewerPA (GI) - Patient was seen for loose stools. No medication changes. Advised she can pretreat steak or hamburger with Imodium OTC. No follow up noted.   10/21/21 [name] (PT) - Patient presented for Stiffness of neck. No medication changes.   10/19/21 [name] (PT) - Patient presented for Stiffness of neck and other concerns. No medication changes.   10/15/21 Rose Lever MD (Pulmonology) - Patient presented for Obstructive sleep apnea and other concerns. No medication changes.  09/09/21 KSigurd Sos PT (outpatient rehab): Patient presented for PT treatment.  09/07/2021 Rose LyonsD, MD (Pulmonary Disease) - Patient was seen for obstructive sleep apnea and COPD. Recommended NicoDerm CQ.  06/16/2021 Rose CarnesMD (cardiology) - Coronary artery disease involving native coronary artery of native heart without angina pectoris. No medication changes. Follow up in 9 months.   [facility]: [facility] due to diaphoresis.  [facility] No new medications started Medication Changes at Hospital Discharge: No medication changes Medications Discontinued at Hospital Discharge: Discontinued Flexeril Medications that remain the same after Hospital Discharge:??  -All other medications will remain the same.      [facility] due to Upper respiratory tract infection.     [facility] amoxicillin 500 MG capsule Take 2 capsules (1,000 mg total) by mouth 2 (two) times daily. benzonatate 100 MG capsule Take 1 capsule (100 mg total) by mouth every 8 (eight) hours. ondansetron 4 MG disintegrating tablet 420mODT q4 hours prn nausea/vomit Medication Changes at Hospital Discharge: -No medication changes Medications Discontinued at Hospital Discharge: -No medications discontinued Medications that remain the same after  Hospital Discharge:??  -All other medications will remain the same.   Patient [facility] on 05/19/2021 (1 hour) due to acute left  sided low back pain without sciatica and an additional issue. Patient was started on Cyclobenzaprine 10 mg twice daily as needed. No medications were changed or discontinued.   Objective:  Lab Results  Component Value Date   CREATININE 1.70 (H) 11/05/2021   BUN 19 11/05/2021   GFR 30.44 (L) 08/19/2021   GFRNONAA 31 (L) 11/05/2021   GFRAA 40 (L) 07/07/2020   NA 137 11/05/2021   K 4.2 11/05/2021   CALCIUM 9.6 11/05/2021   CO2 28 11/05/2021   GLUCOSE 92 11/05/2021    Lab Results  Component Value Date/Time   HGBA1C 5.9 04/17/2021 12:47 PM   HGBA1C 6.6 (H) 11/10/2020 09:26 AM   HGBA1C 6.3 (H) 03/26/2020 10:41 AM   FRUCTOSAMINE 226 11/10/2020 09:26 AM   FRUCTOSAMINE 257 03/26/2019 10:54 AM   GFR 30.44 (L) 08/19/2021 10:56 AM   GFR 51.41 (L) 09/26/2019 01:02 PM   MICROALBUR 5.8 03/26/2020 10:55 AM   MICROALBUR 16.9 (H) 09/26/2019 01:02 PM    Last diabetic Eye exam:  Lab Results  Component Value Date/Time   HMDIABEYEEXA No Retinopathy 04/28/2021 12:00 AM    Last diabetic Foot exam: No results found for: "HMDIABFOOTEX"   Lab Results  Component Value Date   CHOL 143 09/11/2020   HDL 56 09/11/2020   LDLCALC 69 09/11/2020   LDLDIRECT 171.5 12/06/2011   TRIG 98 09/11/2020   CHOLHDL 2.6 09/11/2020       Latest Ref Rng & Units 11/05/2021    4:41 PM 05/19/2021    1:04 PM 01/25/2021    1:35 PM  Hepatic Function  Total Protein 6.5 - 8.1 Blackwell/dL 8.1  8.0  7.7   Albumin 3.5 - 5.0 Blackwell/dL 3.7  4.0  3.5   AST 15 - 41 U/L 29  15  21    ALT 0 - 44 U/L 17  9  14    Alk Phosphatase 38 - 126 U/L 57  60  59   Total Bilirubin 0.3 - 1.2 mg/dL 0.5  0.5  0.6     Lab Results  Component Value Date/Time   TSH 2.91 11/10/2020 09:26 AM   TSH 1.87 09/26/2019 01:02 PM       Latest Ref Rng & Units 11/05/2021    4:41 PM 05/19/2021    1:04 PM 01/25/2021    1:35 PM  CBC  WBC 4.0 - 10.5 K/uL 7.5  7.2  7.6   Hemoglobin 12.0 - 15.0 Blackwell/dL 15.1  15.1  15.1   Hematocrit 36.0 - 46.0 % 45.3  45.9  45.4    Platelets 150 - 400 K/uL 230  242  260     Lab Results  Component Value Date/Time   VD25OH 28 (L) 03/26/2020 10:41 AM   VD25OH 17.09 (L) 09/26/2019 01:02 PM   VD25OH 15.71 (L) 03/26/2019 10:54 AM    Clinical ASCVD: Yes  The ASCVD Risk score (Arnett DK, et al., 2019) failed to calculate for the following reasons:   The patient has a prior MI or stroke diagnosis       11/25/2021   10:20 PM 08/27/2021   11:04 AM 08/27/2021   11:02 AM  Depression screen PHQ 2/9  Decreased Interest 1 1 0  Down, Depressed, Hopeless 1 1 0  PHQ - 2 Score 2 2 0  Altered sleeping 3 1 0  Tired,  decreased energy 2 1   Change in appetite 1 0   Feeling bad or failure about yourself  1 0   Trouble concentrating 0 0   Moving slowly or fidgety/restless 0 0   Suicidal thoughts 0 0   PHQ-9 Score 9 4 0  Difficult doing work/chores Somewhat difficult Not difficult at all      Social History   Tobacco Use  Smoking Status Former   Packs/day: 0.20   Years: 15.00   Total pack years: 3.00   Types: Cigarettes   Start date: 33   Quit date: 09/07/2021   Years since quitting: 0.3  Smokeless Tobacco Never  Tobacco Comments   pt has been an on and off smoker since age 73   BP Readings from Last 3 Encounters:  01/18/22 120/70  11/25/21 128/70  11/05/21 128/82   Pulse Readings from Last 3 Encounters:  01/18/22 71  11/25/21 74  11/05/21 66   Wt Readings from Last 3 Encounters:  01/18/22 229 lb 3.2 oz (104 kg)  11/25/21 224 lb 4 oz (101.7 kg)  10/27/21 222 lb 3.2 oz (100.8 kg)   BMI Readings from Last 3 Encounters:  01/18/22 36.99 kg/m  11/25/21 36.19 kg/m  10/27/21 35.86 kg/m    Assessment/Interventions: Review of patient past medical history, allergies, medications, health status, including review of consultants reports, laboratory and other test data, was performed as part of comprehensive evaluation and provision of chronic care management services.   SDOH:  (Social Determinants of Health)  assessments and interventions performed: Yes   SDOH Screenings   Alcohol Screen: Low Risk  (08/26/2020)   Alcohol Screen    Last Alcohol Screening Score (AUDIT): 1  Depression (PHQ2-9): Medium Risk (11/25/2021)   Depression (PHQ2-9)    PHQ-2 Score: 9  Financial Resource Strain: Low Risk  (08/27/2021)   Overall Financial Resource Strain (CARDIA)    Difficulty of Paying Living Expenses: Not hard at all  Food Insecurity: No Food Insecurity (08/27/2021)   Hunger Vital Sign    Worried About Running Out of Food in the Last Year: Never true    Ran Out of Food in the Last Year: Never true  Housing: Low Risk  (08/27/2021)   Housing    Last Housing Risk Score: 0  Physical Activity: Inactive (08/27/2021)   Exercise Vital Sign    Days of Exercise per Week: 0 days    Minutes of Exercise per Session: 0 min  Social Connections: Moderately Integrated (08/27/2021)   Social Connection and Isolation Panel [NHANES]    Frequency of Communication with Friends and Family: More than three times a week    Frequency of Social Gatherings with Friends and Family: More than three times a week    Attends Religious Services: More than 4 times per year    Active Member of [employer] or Organizations: Yes    Attends Archivist Meetings: More than 4 times per year    Marital Status: Divorced  Stress: No Stress Concern Present (08/27/2021)   [place]    Feeling of Stress : Only a little  Tobacco Use: Medium Risk (01/18/2022)   Patient History    Smoking Tobacco Use: Former    Smokeless Tobacco Use: Never    Passive Exposure: Not on file  Transportation Needs: No Transportation Needs (08/27/2021)   PRAPARE - Hydrologist (Medical): No    Lack of Transportation (Non-Medical): No  CCM Care Plan  Allergies  Allergen Reactions   Oxycodone-Acetaminophen Hives and Itching    hallucinations   Percocet  [Oxycodone-Acetaminophen] Hives, Itching and Other (See Comments)    hallucinations   Pravastatin Sodium Other (See Comments)    Muscle aches Muscle aches   Hydrocodone Other (See Comments)    "crazy dreams"   Aspirin Other (See Comments)    GI upset Other reaction(s): Other (See Comments) REACTION: GI upset    Medications Reviewed Today     Reviewed by Colon Branch, CMA (Certified Medical Assistant) on 01/18/22 at [place] List Status: <None>   Medication Order Taking? Sig Documenting Provider Last Dose Status Informant  acetaminophen (TYLENOL) 500 MG tablet [id number] Yes Take 1,000 mg by mouth as needed for moderate pain or headache.  [provider] Taking Active Self           Med Note Eino Farber   Tue May 19, 2021  3:38 PM) prn  albuterol (VENTOLIN HFA) 108 (90 Base) MCG/ACT inhaler [id number] Yes INHALE ONE OR TWO PUFFS BY MOUTH INTO THE LUNGS EVERY SIX HOURS AS NEEDED FOR WHEEZING OR SHORTNESS OF Kerby Less, MD Taking Active   Alirocumab (PRALUENT) 75 MG/ML SOAJ [id number] Yes Inject 75 mg into the skin every 14 (fourteen) days. Fay Records, MD Taking Active   aspirin 81 MG chewable tablet [id number] Yes Chew 81 mg by mouth daily. [provider] Taking Active Self  Continuous Blood Gluc Receiver (FREESTYLE LIBRE 2 READER) DEVI [id number] Yes Use to check blood sugar, dx:e11.9 [name], Rose G, MD Taking Active Self  Continuous Blood Gluc Sensor (FREESTYLE LIBRE 2 SENSOR) [place] [id number] Yes Use to check blood sugar, dx:e11.9 [name], Rose G, MD Taking Active Self  ezetimibe (ZETIA) 10 MG tablet [id number] Yes Take 1 tablet (10 mg total) by mouth daily. Fay Records, MD Taking Active   famotidine (PEPCID) 20 MG tablet [id number] Yes TAKE ONE TABLET BY MOUTH EVERY MORNING and TAKE ONE TABLET BY MOUTH EVERYDAY AT BEDTIME [name], Rose G, MD Taking Active   fluticasone (FLONASE) 50 MCG/ACT nasal spray [id number] Yes PLACE 2 SPRAYS INTO BOTH  NOSTRILS AT BEDTIME AS NEEDED FOR ALLERGIES. [name], Rose G, MD Taking Active   Fluticasone-Umeclidin-[facility] ELLIPTA) 100-62.5-25 MCG/INH AEPB [id number] Yes Inhale 1 puff into the lungs daily. Baird Blackwell D, MD Taking Active Self  gabapentin (NEURONTIN) 300 MG capsule [id number] Yes Take 2 capsules (600 mg total) by mouth 2 (two) times daily. Penumalli, Earlean Polka, MD Taking Active Self  hydrochlorothiazide (HYDRODIURIL) 25 MG tablet [id number] Yes Take 1 tablet (25 mg total) by mouth daily. [name], Rose G, MD Taking Active   losartan (COZAAR) 100 MG tablet [id number] Yes Take 1 tablet (100 mg total) by mouth daily. [name], Rose G, MD Taking Active   metoprolol succinate (TOPROL-XL) 100 MG 24 hr tablet [id number] Yes TAKE ONE TABLET BY MOUTH AT NOON [name], Malka So, MD Taking Active   montelukast (SINGULAIR) 10 MG tablet [id number] Yes TAKE ONE TABLET BY MOUTH EVERYDAY AT BEDTIME [name], Rose G, MD Taking Active   MYRBETRIQ 50 MG TB24 tablet [id number] Yes Take 50 mg by mouth daily. [provider] Taking Active   nitroGLYCERIN (NITROSTAT) 0.4 MG SL tablet [id number] Yes Place 1 tablet (0.4 mg total) under the tongue every 5 (five) minutes as needed for chest pain. Fay Records, MD Taking Active   ondansetron (ZOFRAN-ODT) 4 MG disintegrating tablet [id number] Yes 31m ODT q4 hours  prn nausea/vomit Deno Etienne, DO Taking Active   pantoprazole (PROTONIX) 40 MG tablet [id number]  Take 1 tablet (40 mg total) by mouth daily. Darliss Cheney, MD  Expired 09/01/21 2359 Self  polyethylene glycol powder (GLYCOLAX/MIRALAX) powder [id number] Yes Take 17 Blackwell by mouth daily. Levin Erp, [place] Taking Active Self  rosuvastatin (CRESTOR) 20 MG tablet [id number] Yes Take 1 tablet (20 mg total) by mouth daily. Fay Records, MD Taking Active   sertraline (ZOLOFT) 25 MG tablet [id number] Yes Take 1 tablet (25 mg total) by mouth daily. [name], Rose G, MD Taking Active             Patient Active  Problem List   Diagnosis Date Noted   PAD (peripheral artery disease) ([place]) 11/28/2021   OSA (obstructive sleep apnea) 01/16/2021   COPD mixed type ([place]) 01/16/2021   Fatigue 11/03/2020   B12 deficiency 11/03/2020   Chest pain 08/30/2020   Malignant neoplasm of kidney excluding renal pelvis, unspecified laterality ([place]) 09/26/2019   Vitamin D deficiency, unspecified 09/26/2019   Anxiety disorder, unspecified 03/26/2019   Tobacco use disorder 09/29/2018   Cervical myelopathy ([place]) 05/29/2018   ANA positive 12/08/2017   Cervicalgia 09/16/2017   Carpal tunnel syndrome of left wrist 07/21/2017   Chondrocalcinosis 07/21/2017   Neuropathy 07/21/2017   Orthostatic hypotension 05/28/2017   S/P angioplasty with stent 05/27/17 to LCX with DES  05/28/2017   CAD (coronary artery disease)    Polyneuropathy 05/25/2017   Chest pain with moderate risk for cardiac etiology 05/25/2017   Class 1 obesity with serious comorbidity and body mass index (BMI) of 34.0 to 34.9 in adult 12/06/2016   CKD (chronic kidney disease) stage 3, GFR 30-59 ml/min (HCC) 10/11/2016   Hot flashes, menopausal 10/01/2016   Chest pain with moderate risk of acute coronary syndrome 10/25/2014   Degenerative spondylolisthesis 10/15/2014   Spinal stenosis, lumbar region, with neurogenic claudication 02/12/2013   DM (diabetes mellitus), type 2 with renal complications ([place]) 81/05/3158   History of Rtr nephrectomy 2010, secondary to renal cell cancer 01/14/2009   PERSONAL HX COLONIC POLYPS 11/14/2008   Hyperlipidemia associated with type 2 diabetes mellitus ([place]) 11/16/2007   ESOPHAGEAL STRICTURE 10/12/2007   Hypertension with heart disease 01/19/2007   Allergic rhinitis 01/19/2007   GERD 01/19/2007    Immunization History  Administered Date(s) Administered   Fluad Quad(high Dose 65+) 05/05/2019, 06/09/2020, 04/17/2021   Influenza Split 05/23/2012, 06/07/2013   Influenza Whole 08/02/2005, 05/08/2007, 05/09/2008,  08/19/2009, 06/03/2010   Influenza, High Dose Seasonal PF 06/20/2014, 05/13/2015, 06/08/2016, 05/23/2017, 05/18/2018   PFIZER Comirnaty(Gray Top)Covid-19 Tri-Sucrose Vaccine 12/17/2020   Pfizer Covid-19 Vaccine Bivalent Booster 11yr & up 06/17/2021   Pneumococcal Conjugate-13 02/27/2007   Pneumococcal Polysaccharide-23 02/27/2007, 12/16/2011, 10/16/2014, 05/26/2017   Td 08/02/2001   Tdap 12/16/2011   Zoster, Live 12/27/2012   Patient is feeling sleepy with taking gabapentin in the morning. She is going to try to to move it to the afternoon instead.   Patient has been using the 14 mg nicotine patches and hasn't smoked in 3 weeks. She inquired about skipping the 7 mg all together and just stopped but advised against doing so. She has been taking off the patches before going to bed because she was having nightmares. She is also having nightsweats but this started before she started on the patches.  Conditions to be addressed/monitored:  Hypertension, Hyperlipidemia, Diabetes, Coronary Artery Disease, GERD, COPD, Tobacco use, Overactive Bladder, Gout, and Allergic Rhinitis  Conditions addressed this visit:  Hypertension, hyperlipidemia, tobacco use  There are no care plans that you recently modified to display for this patient.    Medication Assistance:  Patient does not qualify for patient assistance as she lives with daughter.  Compliance/Adherence/Medication fill history: Care Gaps: COVID booster, shingrix, tetanus, mammogram, A1c Last BP - 120/70 on 01/18/2022  Last HGA1C was 5.9 on 04/17/2021  [facility]: Losartan 100 mg - last filled 11/27/2021 30 DS at [facility]  Rosuvastatin 20 mg - last filled 11/27/2021 30 DS at [facility]   Patient's preferred pharmacy is:  [facility] [address] [place] [address] Phone: [phone] Fax: [place] #[id number] [place], [place] [address] [place] [address] Phone: [phone] Fax: [phone]  [id number] [place], [place]: [phone]: [phone] [facility] [place] [place]: [phone]: [phone]  Uses pill box? Yes - 7 days a week - morning noon and night Pt endorses 95% compliance - misses midday  We discussed: Benefits of medication synchronization, packaging and delivery as well as enhanced pharmacist oversight with [facility]. Patient decided to: Utilize [facility] pharmacy for medication synchronization, packaging and delivery  Care Plan and Follow Up Patient Decision:  Patient agrees to Care Plan and Follow-up.  Plan: Telephone follow up appointment with care management team member scheduled for:  4 months  MJeni Salles PharmD, BStaplesat BSacred [phone]

## 2022-01-22 ENCOUNTER — Encounter: Payer: Self-pay | Admitting: Family Medicine

## 2022-01-22 ENCOUNTER — Ambulatory Visit (INDEPENDENT_AMBULATORY_CARE_PROVIDER_SITE_OTHER): Payer: Medicare HMO | Admitting: Pharmacist

## 2022-01-22 ENCOUNTER — Ambulatory Visit (INDEPENDENT_AMBULATORY_CARE_PROVIDER_SITE_OTHER): Payer: Medicare HMO | Admitting: Family Medicine

## 2022-01-22 VITALS — BP 130/80 | HR 65 | Resp 16 | Ht 66.0 in | Wt 229.0 lb

## 2022-01-22 DIAGNOSIS — E1122 Type 2 diabetes mellitus with diabetic chronic kidney disease: Secondary | ICD-10-CM

## 2022-01-22 DIAGNOSIS — M25511 Pain in right shoulder: Secondary | ICD-10-CM

## 2022-01-22 DIAGNOSIS — R69 Illness, unspecified: Secondary | ICD-10-CM | POA: Diagnosis not present

## 2022-01-22 DIAGNOSIS — E1169 Type 2 diabetes mellitus with other specified complication: Secondary | ICD-10-CM

## 2022-01-22 DIAGNOSIS — I119 Hypertensive heart disease without heart failure: Secondary | ICD-10-CM

## 2022-01-22 DIAGNOSIS — M542 Cervicalgia: Secondary | ICD-10-CM

## 2022-01-22 DIAGNOSIS — F320 Major depressive disorder, single episode, mild: Secondary | ICD-10-CM | POA: Diagnosis not present

## 2022-01-22 MED ORDER — TRAMADOL HCL 50 MG PO TABS: 50.0000 mg | ORAL_TABLET | Freq: Two times a day (BID) | ORAL | 0 refills | Status: AC | PRN

## 2022-01-28 ENCOUNTER — Encounter: Payer: Self-pay | Admitting: Family Medicine

## 2022-01-28 DIAGNOSIS — R922 Inconclusive mammogram: Secondary | ICD-10-CM | POA: Diagnosis not present

## 2022-01-28 DIAGNOSIS — R928 Other abnormal and inconclusive findings on diagnostic imaging of breast: Secondary | ICD-10-CM | POA: Diagnosis not present

## 2022-01-29 DIAGNOSIS — E1122 Type 2 diabetes mellitus with diabetic chronic kidney disease: Secondary | ICD-10-CM | POA: Diagnosis not present

## 2022-01-29 DIAGNOSIS — E1169 Type 2 diabetes mellitus with other specified complication: Secondary | ICD-10-CM

## 2022-01-29 DIAGNOSIS — I119 Hypertensive heart disease without heart failure: Secondary | ICD-10-CM

## 2022-01-29 DIAGNOSIS — N183 Chronic kidney disease, stage 3 unspecified: Secondary | ICD-10-CM

## 2022-01-29 DIAGNOSIS — E785 Hyperlipidemia, unspecified: Secondary | ICD-10-CM

## 2022-01-29 NOTE — Progress Notes (Unsigned)
Office Visit    Patient Name: [name] Date of Encounter: 02/01/2022  Primary Care Provider:  [name], [name], MD Primary Cardiologist:  Dorris Carnes, MD Primary Electrophysiologist: None  Chief Complaint    [name] is a 76 y.o. female with PMH of CAD s/p DES to LCx 2018 and mild to moderate nonobstructive CAD and other vessels, HTN, HLD (with myalgias), DM, CKD stage III with prior renal CA s/p right nephrectomy, RA, chronic edema chronic OSA, neuropathy who presents today for follow-up of coronary artery disease.  Past Medical History    Past Medical History:  Diagnosis Date   Allergy    Anxiety    C. difficile diarrhea    history of   CAD (coronary artery disease), native coronary artery    a. 05/2017 showed 100% mLCx tx with DES, otherwise 20% mRCA, 50% ramus, 30% dLAD, 20% prox-mid LAD, 20% mLAD treated medically.    Cancer [facility])    Right kidney CA removed.    CKD (chronic kidney disease) stage 3, GFR 30-59 ml/min ([place]) 10/11/2016   s/p R nephrectomy   Colon polyps 2008   HYPERPLASTIC   Diabetes mellitus without complication ([place])    type 2  no meds   Gastritis    GERD (gastroesophageal reflux disease)    History of echocardiogram    Echo 3/18:  Moderate LVH, EF 50-09, grade 1 diastolic dysfunction, calcified aortic valve, mild MR, moderate LAE   History of nuclear stress test    Myoview 3/18: Mod size and intensity fixed septal defect, may be artifact.Opposite mod size and intensity lat defect, which is reversible and could represent ischemia or possibly artifact (SDS 4). LVEF 71% with normal wall motion. Intermediate risk study. >> images reviewed with Dr. Dorris Carnes - no sig ischemia; med rx    Hx of cardiovascular stress test    Lexiscan Myoview 6/16:  EF 70%, no scar or ischemia; Low Risk   Hyperlipidemia    Hypertension    Neuropathy    Orthostatic hypotension 05/28/2017   OSA (obstructive sleep apnea) 01/16/2021   Osteoarthritis    Rheumatoid  arthritis ([place])    RA (Dr. Ouida Sills) Bilateral hands   S/P angioplasty with stent 05/27/17 to LCX with DES  05/28/2017   Sleep apnea 2023   Thyroid disease    Past Surgical History:  Procedure Laterality Date   ABDOMINAL HYSTERECTOMY  1978   ANTERIOR CERVICAL DECOMP/DISCECTOMY FUSION N/A 05/29/2018   Procedure: ANTERIOR CERVICAL DECOMPRESSION FUSION - CERVICAL FIVE-CERVICAL SIX - CERVICAL SIX-CERVICAL SEVEN;  Surgeon: Earnie Larsson, MD;  Location: [place];  Service: Neurosurgery;  Laterality: N/A;   BACK SURGERY     x 2   CARPAL TUNNEL RELEASE Left    COLONOSCOPY W/ BIOPSIES AND POLYPECTOMY     Hx: of   CORONARY STENT INTERVENTION N/A 05/27/2017   Procedure: CORONARY STENT INTERVENTION;  Surgeon: Burnell Blanks, MD;  Location: [place] [facility];  Service: Cardiovascular;  Laterality: N/A;   ESOPHAGOGASTRODUODENOSCOPY     HNP     LEFT HEART CATH AND CORONARY ANGIOGRAPHY N/A 05/27/2017   Procedure: LEFT HEART CATH AND CORONARY ANGIOGRAPHY;  Surgeon: Burnell Blanks, MD;  Location: [place] [facility];  Service: Cardiovascular;  Laterality: N/A;   LUMBAR LAMINECTOMY/DECOMPRESSION MICRODISCECTOMY Left 02/12/2013   Procedure: LUMBAR TWO THREE, LUMBAR THREE FOUR, LUMBAR FOUR FIVE  LAMINECTOMY/DECOMPRESSION MICRODISCECTOMY 3 LEVELS;  Surgeon: Charlie Pitter, MD;  Location: [facility];  Service:  Neurosurgery;  Laterality: Left;   NEPHRECTOMY Right 2010   10.rcc cancer   TOTAL KNEE ARTHROPLASTY Right    Redo    Allergies  Allergies  Allergen Reactions   Oxycodone-Acetaminophen Hives and Itching    hallucinations   Percocet [Oxycodone-Acetaminophen] Hives, Itching and Other (See Comments)    hallucinations   Pravastatin Sodium Other (See Comments)    Muscle aches Muscle aches   Hydrocodone Other (See Comments)    "crazy dreams"   Aspirin Other (See Comments)    GI upset Other reaction(s): Other (See Comments) REACTION: GI upset    History of Present Illness     [name] is a 76 year old female with the above-mentioned past medical history who presents today for follow-up of coronary artery disease.  Patient normal Myoview in 2016 and subsequent testing 2019 revealed lateral defect with significant ischemia. CT showed + FFR and LHC was performed in 05/2017 that revealed 100% and LCx that was treated with DES x1 and 20% mRCA, 50% ramus, 30% dLAD, 20% prox-mid LAD, 20% mLAD treated medically. Echo at that time showed mild LVH, EF 65-70%, grade 1 DD.  She was seen in the ED at [facility] on 08/2020 for complaint of chest pain.  Troponins were negative x2 and EKG without acute changes.  2D echo was completed that showed normal LV function and patient underwent Lexiscan nuclear study that revealed no evidence of ischemia.  Patient is noted to have GI history of esophageal strictures.  She was last seen by Dr. Harrington Challenger on 06/2021 for routine follow-up.  Patient was doing okay at that time and was being fitted for CPAP.  She denies chest pain and was attempting to get back into water aerobics.  No medication changes were made during this visit.  Since last being seen in the office patient reports she has been doing well with no cardiac complaints.  She reports that she quit smoking in December and I congratulated her for this accomplishment.  She is compliant with all of her medications and reports no side effects currently.  She is being refitted for a new CPAP mask due to claustrophobia and is undergoing breast biopsy next few days.  She has not started water aerobics yet due to her signs renal failure and surgical needs.  She currently denies chest pain, palpitations, dyspnea, PND, orthopnea, nausea, vomiting, dizziness, syncope, edema, weight gain, or early satiety.  Home Medications    Current Outpatient Medications  Medication Sig Dispense Refill   acetaminophen (TYLENOL) 500 MG tablet Take 1,000 mg by mouth as needed for moderate pain or headache.       albuterol (VENTOLIN HFA) 108 (90 Base) MCG/ACT inhaler INHALE ONE OR TWO PUFFS BY MOUTH INTO THE LUNGS EVERY SIX HOURS AS NEEDED FOR WHEEZING OR SHORTNESS OF BREATH 8.5 g 12   Alirocumab (PRALUENT) 75 MG/ML SOAJ Inject 75 mg into the skin every 14 (fourteen) days. 2 mL 11   aspirin 81 MG chewable tablet Chew 81 mg by mouth daily.     Continuous Blood Gluc Receiver (FREESTYLE LIBRE 2 READER) DEVI Use to check blood sugar, dx:e11.9 1 each 2   Continuous Blood Gluc Sensor (FREESTYLE LIBRE 2 SENSOR) MISC Use to check blood sugar, dx:e11.9 3 each 3   ezetimibe (ZETIA) 10 MG tablet Take 1 tablet (10 mg total) by mouth daily. 90 tablet 3   famotidine (PEPCID) 20 MG tablet TAKE ONE TABLET BY MOUTH EVERY MORNING and TAKE ONE TABLET BY  MOUTH EVERYDAY AT BEDTIME 180 tablet 2   fluticasone (FLONASE) 50 MCG/ACT nasal spray PLACE 2 SPRAYS INTO BOTH NOSTRILS AT BEDTIME AS NEEDED FOR ALLERGIES. 48 mL 1   Fluticasone-Umeclidin-Vilant (TRELEGY ELLIPTA) 100-62.5-25 MCG/INH AEPB Inhale 1 puff into the lungs daily. 60 each 5   gabapentin (NEURONTIN) 300 MG capsule Take 2 capsules (600 mg total) by mouth 2 (two) times daily. 360 capsule 4   hydrochlorothiazide (HYDRODIURIL) 25 MG tablet TAKE ONE TABLET BY MOUTH ONCE DAILY 90 tablet 2   losartan (COZAAR) 100 MG tablet TAKE ONE TABLET BY MOUTH EVERY MORNING 90 tablet 2   metoprolol succinate (TOPROL-XL) 100 MG 24 hr tablet TAKE ONE TABLET BY MOUTH AT NOON 90 tablet 1   montelukast (SINGULAIR) 10 MG tablet TAKE ONE TABLET BY MOUTH EVERYDAY AT BEDTIME 30 tablet 3   MYRBETRIQ 50 MG TB24 tablet Take 50 mg by mouth daily.     nitroGLYCERIN (NITROSTAT) 0.4 MG SL tablet Place 1 tablet (0.4 mg total) under the tongue every 5 (five) minutes as needed for chest pain. 25 tablet 2   ondansetron (ZOFRAN-ODT) 4 MG disintegrating tablet '4mg'$  ODT q4 hours prn nausea/vomit 20 tablet 0   polyethylene glycol powder (GLYCOLAX/MIRALAX) powder Take 17 g by mouth daily. 500 g 3   rosuvastatin  (CRESTOR) 20 MG tablet Take 1 tablet (20 mg total) by mouth daily. 90 tablet 3   sertraline (ZOLOFT) 25 MG tablet Take 1 tablet (25 mg total) by mouth daily. 30 tablet 3   pantoprazole (PROTONIX) 40 MG tablet Take 1 tablet (40 mg total) by mouth daily. 30 tablet 1   No current facility-administered medications for this visit.     Review of Systems  Please see the history of present illness.    (+) Chronic back pain (+) Chronic shortness of breath  All other systems reviewed and are otherwise negative except as noted above.  Physical Exam    Wt Readings from Last 3 Encounters:  02/01/22 225 lb 12.8 oz (102.4 kg)  01/22/22 229 lb (103.9 kg)  01/18/22 229 lb 3.2 oz (104 kg)   VS: Vitals:   02/01/22 1101  BP: 130/70  Pulse: 67  SpO2: 99%  ,Body mass index is 36.45 kg/m.  Constitutional:      Appearance: Healthy appearance. Not in distress.  Neck:     Vascular: JVD normal.  Pulmonary:     Effort: Pulmonary effort is normal.     Breath sounds: No wheezing. No rales. Diminished in the bases Cardiovascular:     Normal rate. Regular rhythm. Normal S1. Normal S2.      Murmurs: There is no murmur.  Edema:    Peripheral edema absent.  Abdominal:     Palpations: Abdomen is soft non tender. There is no hepatomegaly.  Skin:    General: Skin is warm and dry.  Neurological:     General: No focal deficit present.     Mental Status: Alert and oriented to person, place and time.     Cranial Nerves: Cranial nerves are intact.  EKG/LABS/Other Studies Reviewed    ECG personally reviewed by me today -none completed today   Lab Results  Component Value Date   WBC 7.5 11/05/2021   HGB 15.1 (H) 11/05/2021   HCT 45.3 11/05/2021   MCV 91.1 11/05/2021   PLT 230 11/05/2021   Lab Results  Component Value Date   CREATININE 1.70 (H) 11/05/2021   BUN 19 11/05/2021   NA 137 11/05/2021  K 4.2 11/05/2021   CL 103 11/05/2021   CO2 28 11/05/2021   Lab Results  Component Value Date    ALT 17 11/05/2021   AST 29 11/05/2021   ALKPHOS 57 11/05/2021   BILITOT 0.5 11/05/2021   Lab Results  Component Value Date   CHOL 143 09/11/2020   HDL 56 09/11/2020   LDLCALC 69 09/11/2020   LDLDIRECT 171.5 12/06/2011   TRIG 98 09/11/2020   CHOLHDL 2.6 09/11/2020    Lab Results  Component Value Date   HGBA1C 5.9 04/17/2021    Assessment & Plan    1.  Coronary artery disease: -s/p DES to LCx 2018 and mild to moderate nonobstructive CAD -Last 2D echo completed 2022 with LVH, EF 65-70%, grade 1 DD. -Patient reports no complaints of angina or acute shortness of breath -Continue GDMT with ASA 81 mg, Crestor 20 mg, and Zetia 10 mg daily  2.Hypertension: -Blood pressure today is well controlled at 130/70 -Continue Cozaar 100 mg daily  3.  Hyperlipidemia: -Patient's last LDL was 69 on 09/2020 and reports no myalgias with current regimen -Continue Zetia and Crestor as noted above  4.  Peripheral artery disease:     -Patient reports no complaints of claudication and states that she has recently quit smoking in December.  Disposition: Follow-up with Dorris Carnes, MD or APP in 12 months  Medication Adjustments/Labs and Tests Ordered: Current medicines are reviewed at length with the patient today.  Concerns regarding medicines are outlined above.   Signed, Mable Fill, Marissa Nestle, NP 02/01/2022, 11:44 AM [place]

## 2022-02-01 ENCOUNTER — Encounter: Payer: Self-pay | Admitting: Nurse Practitioner

## 2022-02-01 ENCOUNTER — Ambulatory Visit (INDEPENDENT_AMBULATORY_CARE_PROVIDER_SITE_OTHER): Payer: Medicare HMO | Admitting: Nurse Practitioner

## 2022-02-01 VITALS — BP 130/70 | HR 67 | Ht 66.0 in | Wt 225.8 lb

## 2022-02-01 DIAGNOSIS — E785 Hyperlipidemia, unspecified: Secondary | ICD-10-CM | POA: Diagnosis not present

## 2022-02-01 DIAGNOSIS — I2510 Atherosclerotic heart disease of native coronary artery without angina pectoris: Secondary | ICD-10-CM | POA: Diagnosis not present

## 2022-02-01 DIAGNOSIS — E1169 Type 2 diabetes mellitus with other specified complication: Secondary | ICD-10-CM

## 2022-02-01 DIAGNOSIS — I739 Peripheral vascular disease, unspecified: Secondary | ICD-10-CM

## 2022-02-01 NOTE — Patient Instructions (Signed)
Medication Instructions:   Your physician recommends that you continue on your current medications as directed. Please refer to the Current Medication list given to you today.   *If you need a refill on your cardiac medications before your next appointment, please call your pharmacy*   Lab Work:  None ordered.  If you have labs (blood work) drawn today and your tests are completely normal, you will receive your results only by: [place] (if you have MyChart) OR A paper copy in the mail If you have any lab test that is abnormal or we need to change your treatment, we will call you to review the results.   Testing/Procedures:   None ordered.   Follow-Up: At [facility], you and your health needs are our priority.  As part of our continuing mission to provide you with exceptional heart care, we have created designated Provider Care Teams.  These Care Teams include your primary Cardiologist (physician) and Advanced Practice Providers (APPs -  Physician Assistants and Nurse Practitioners) who all work together to provide you with the care you need, when you need it.  We recommend signing up for the patient portal called "MyChart".  Sign up information is provided on this After Visit Summary.  MyChart is used to connect with patients for Virtual Visits (Telemedicine).  Patients are able to view lab/test results, encounter notes, upcoming appointments, etc.  Non-urgent messages can be sent to your provider as well.   To learn more about what you can do with MyChart, go to [email or web address].    Your next appointment:   1 year(s)  The format for your next appointment:   In Person  Provider:   Dorris Carnes, MD     Other Instructions  Your physician wants you to follow-up in: 1 year with Dr.Ross.  You will receive a reminder letter in the mail two months in advance. If you don't receive a letter, please call our office to schedule the follow-up appointment.   Important  Information About [place]

## 2022-02-03 ENCOUNTER — Other Ambulatory Visit: Payer: Self-pay | Admitting: Radiology

## 2022-02-03 DIAGNOSIS — D241 Benign neoplasm of right breast: Secondary | ICD-10-CM | POA: Diagnosis not present

## 2022-02-03 DIAGNOSIS — N6341 Unspecified lump in right breast, subareolar: Secondary | ICD-10-CM | POA: Diagnosis not present

## 2022-02-15 DIAGNOSIS — M25511 Pain in right shoulder: Secondary | ICD-10-CM | POA: Diagnosis not present

## 2022-02-17 ENCOUNTER — Ambulatory Visit: Payer: Medicare HMO | Admitting: Adult Health

## 2022-02-19 ENCOUNTER — Telehealth: Payer: Self-pay | Admitting: Pharmacist

## 2022-02-19 NOTE — Chronic Care Management (AMB) (Signed)
Chronic Care Management Pharmacy Assistant   Name: [name]  MRN: [id number] DOB: June 27, 1946  Reason for Encounter: Medication Review / Medication Coordination Call  Recent office visits:  01/22/2022 [name] MD - Patient was seen for Right shoulder pain, unspecified chronicity and additional issues. Referral to Orthopedic Surgery. Started tramadol 10 mg bid prn. Follow up in 2 months.   Recent consult visits:  02/01/2022 Rebekah Chesterfield NP [facility] heartcare) - Patient was seen for Coronary artery disease involving native coronary artery of native heart without angina pectoris and additional issues. No medication changes. Follow up in 1 year.   Hospital visits:  None  Medications: Outpatient Encounter Medications as of 02/19/2022  Medication Sig   acetaminophen (TYLENOL) 500 MG tablet Take 1,000 mg by mouth as needed for moderate pain or headache.    albuterol (VENTOLIN HFA) 108 (90 Base) MCG/ACT inhaler INHALE ONE OR TWO PUFFS BY MOUTH INTO THE LUNGS EVERY SIX HOURS AS NEEDED FOR WHEEZING OR SHORTNESS OF BREATH   Alirocumab (PRALUENT) 75 MG/ML SOAJ Inject 75 mg into the skin every 14 (fourteen) days.   aspirin 81 MG chewable tablet Chew 81 mg by mouth daily.   Continuous Blood Gluc Receiver (FREESTYLE LIBRE 2 READER) DEVI Use to check blood sugar, dx:e11.9   Continuous Blood Gluc Sensor (FREESTYLE LIBRE 2 SENSOR) MISC Use to check blood sugar, dx:e11.9   ezetimibe (ZETIA) 10 MG tablet Take 1 tablet (10 mg total) by mouth daily.   famotidine (PEPCID) 20 MG tablet TAKE ONE TABLET BY MOUTH EVERY MORNING and TAKE ONE TABLET BY MOUTH EVERYDAY AT BEDTIME   fluticasone (FLONASE) 50 MCG/ACT nasal spray PLACE 2 SPRAYS INTO BOTH NOSTRILS AT BEDTIME AS NEEDED FOR ALLERGIES.   Fluticasone-Umeclidin-Vilant (TRELEGY ELLIPTA) 100-62.5-25 MCG/INH AEPB Inhale 1 puff into the lungs daily.   gabapentin (NEURONTIN) 300 MG capsule Take 2 capsules (600 mg total) by mouth 2 (two) times daily.    hydrochlorothiazide (HYDRODIURIL) 25 MG tablet TAKE ONE TABLET BY MOUTH ONCE DAILY   losartan (COZAAR) 100 MG tablet TAKE ONE TABLET BY MOUTH EVERY MORNING   metoprolol succinate (TOPROL-XL) 100 MG 24 hr tablet TAKE ONE TABLET BY MOUTH AT NOON   montelukast (SINGULAIR) 10 MG tablet TAKE ONE TABLET BY MOUTH EVERYDAY AT BEDTIME   MYRBETRIQ 50 MG TB24 tablet Take 50 mg by mouth daily.   nitroGLYCERIN (NITROSTAT) 0.4 MG SL tablet Place 1 tablet (0.4 mg total) under the tongue every 5 (five) minutes as needed for chest pain.   ondansetron (ZOFRAN-ODT) 4 MG disintegrating tablet '4mg'$  ODT q4 hours prn nausea/vomit   pantoprazole (PROTONIX) 40 MG tablet Take 1 tablet (40 mg total) by mouth daily.   polyethylene glycol powder (GLYCOLAX/MIRALAX) powder Take 17 g by mouth daily.   rosuvastatin (CRESTOR) 20 MG tablet Take 1 tablet (20 mg total) by mouth daily.   sertraline (ZOLOFT) 25 MG tablet Take 1 tablet (25 mg total) by mouth daily.   No facility-administered encounter medications on file as of 02/19/2022.  Reviewed chart for medication changes ahead of medication coordination call.  No OVs, Consults, or hospital visits since last care coordination call/Pharmacist visit. (If appropriate, list visit date, provider name)  No medication changes indicated OR if recent visit, treatment plan here.  BP Readings from Last 3 Encounters:  02/01/22 130/70  01/22/22 130/80  01/18/22 120/70    Lab Results  Component Value Date   HGBA1C 5.9 04/17/2021     Patient obtains medications through Adherence  Packaging  30 Days    Last adherence delivery included:  Praluent Inj 75 mg - inject 75 mg every 14 days Gabapentin 300 mg - take 2 tablets at lunch and 2 tablets at bedtime Ezetimibe 10 mg  - take one tablet at breakfast Famotidine 20 mg - take one tablet at breakfast and one tablet at bedtime Rosuvastatin 20 mg - take one tablet at bedtime Metoprolol Suc 100 mg - take one tablet at lunch Losartan 100 mg  - take one tablet at breakfast HCTZ 25 mg - take one tablet at breakfast Montelukast 10 mg - take one tablet at bedtime Myrbetriq 50 mg - take one tablet at breakfast Sertraline 25 mg - take one tablet at lunch   Patient declined the following medications:  No medications were denied   Patient is due for next adherence delivery on: 03/03/2022   Called patient and reviewed medications and coordinated delivery.   This delivery to include: Praluent Inj 75 mg - inject 75 mg every 14 days Gabapentin 300 mg - take 2 tablets at lunch and 2 tablets at bedtime Ezetimibe 10 mg  - take one tablet at breakfast Famotidine 20 mg - take one tablet at breakfast and one tablet at bedtime Rosuvastatin 20 mg - take one tablet at bedtime Metoprolol Suc 100 mg - take one tablet at lunch Losartan 100 mg - take one tablet at breakfast HCTZ 25 mg - take one tablet at breakfast Montelukast 10 mg - take one tablet at bedtime Myrbetriq 50 mg - take one tablet at breakfast Sertraline 25 mg - take one tablet at lunch   Patient will need a short fill: No short fills needed   Coordinated acute fill: No acute fills needed   Patient declined the following medications:  No declined medications.   Confirmed delivery date 03/03/2022, advised patient that pharmacy will contact them the morning of delivery.  Care Gaps: AWV - scheduled 08/30/2022 Last BP -  130/70 on 02/01/2022 Last A1C - 5.9 on 04/17/2022 Shingrix - never done Covid booster - overdue HGA1C - overdue Tdap - overdue  Star Rating Drugs: Losartan 100 mg - last filled 01/28/2022 30 DS at [facility] Rosuvastatin 20 mg - last filled 01/28/2022 30 DS at [facility] [phone]

## 2022-02-26 ENCOUNTER — Ambulatory Visit: Payer: Self-pay | Admitting: Surgery

## 2022-02-26 DIAGNOSIS — D241 Benign neoplasm of right breast: Secondary | ICD-10-CM

## 2022-02-28 ENCOUNTER — Encounter: Payer: Self-pay | Admitting: Internal Medicine

## 2022-02-28 NOTE — Assessment & Plan Note (Signed)
Sleep studies consistently show severe OSA. Options discussed. Plan- retry CPAP auto 5-20 with mask fitting at sleep center

## 2022-03-07 ENCOUNTER — Ambulatory Visit (HOSPITAL_COMMUNITY)
Admission: EM | Admit: 2022-03-07 | Discharge: 2022-03-07 | Disposition: A | Discharge status: Home / Self Care | Payer: Medicare HMO | Attending: Psychiatry | Admitting: Psychiatry

## 2022-03-07 DIAGNOSIS — R69 Illness, unspecified: Secondary | ICD-10-CM | POA: Diagnosis not present

## 2022-03-07 DIAGNOSIS — F419 Anxiety disorder, unspecified: Secondary | ICD-10-CM | POA: Insufficient documentation

## 2022-03-07 DIAGNOSIS — F332 Major depressive disorder, recurrent severe without psychotic features: Secondary | ICD-10-CM

## 2022-03-07 DIAGNOSIS — Z79899 Other long term (current) drug therapy: Secondary | ICD-10-CM | POA: Diagnosis not present

## 2022-03-07 DIAGNOSIS — F320 Major depressive disorder, single episode, mild: Secondary | ICD-10-CM

## 2022-03-07 MED ORDER — SERTRALINE HCL 25 MG PO TABS: 25.0000 mg | ORAL_TABLET | Freq: Every day | ORAL | 0 refills | Status: DC

## 2022-03-07 NOTE — Discharge Instructions (Addendum)
Please contact one of the following facilities to start medication management and therapy services:  ? ?[facility] ?[address]  ?[place], [place] [address] ?[phone]  ? ?[facility]  ?[address] ?[place], [place] [address] ?[phone] ? ?[facility]  ?[address], [place], [place] [address] ?[phone] ? ?[facility]  ?[address]  ?[place], [place] [address] ?[phone] ? ?[facility]  ?[address] ?[place], [place] [address] ?[phone] ? ?[facility]  ?[address],  ?[place],  [address] ?[phone]  ?

## 2022-03-07 NOTE — ED Provider Notes (Addendum)
[facility] Medical Screening Exam  Patient Name: [name] MRN: [id number] Date of Evaluation: 03/08/22 Chief Complaint:   Diagnosis:  Final diagnoses:  Severe episode of recurrent major depressive disorder, without psychotic features ([place])    History of Present illness: [name] is a 76 y.o. female patient presented to [facility] as a walk in with her daughter with complaints of increased depression and anxiety.  [name], 76 y.o., female patient seen face to face by this provider, consulted with Dr. Dwyane Dee; and chart reviewed on 03/08/22.  Patient reports she has been diagnosed with depression and anxiety.  She is prescribed Zoloft 25 mg daily by her PCP Dr. [name]..  She has a new intake for therapy on 04/12/2022.  Her medications are delivered monthly and are prepackaged.   On evaluation [name] is alert/oriented x4, cooperative, and attentive.  She has normal speech and behavior.  She is well-groomed and makes good eye contact.  Reports over the past year she has experienced an increase in her depression and anxiety.  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  States her son is finally living on his own and taking care of himself.  She also lost her pet that she considered her "best friend".  She has feelings of hopelessness, tearfulness loss of interest, self isolation, decreased energy and decreased focus.  She also reports a decrease in sleep and appetite.  She denies SI/HI/AVH.  Objectively she does not appear to be responding to internal/external stimuli.  She is able to converse coherently without any distractibility.  She easily contracts for safety.  She denies access to firearms/weapons.  Reports she has a great support system.  She identifies her family and her faith is her largest protective factor.  Discussed increasing Zoloft to 50 mg daily and patient is in agreement.   Educated on medication and side effects patient verbalized understanding.  30-day supply of 25 mg was sent to patient's pharmacy of choice.  She is instructed to take 25 mg in addition to her current 25 mg to total of 50/day.  In addition her mail order pharmacy was contacted and was instructed to increase next month's dose of Zoloft in the prepackaged containers to 50 mg daily.  At this time [name] is educated and verbalizes understanding of mental health resources and other crisis services in the community.  She is instructed to call 911 and present to the nearest emergency room should she experience any suicidal/homicidal ideation, auditory/visual/hallucinations, or detrimental worsening of her r mental health condition.  She e was a also advised by Probation officer that her could call the toll-free phone on insurance card to assist with identifying in network counselors and agencies or number on back of Medicaid card t speak with care coordinator    Psychiatric Specialty Exam  Presentation  General Appearance:Appropriate for Environment  Eye Contact:Good  Speech:Clear and Coherent; Normal Rate  Speech Volume:Normal  Handedness:Right   Mood and Affect  Mood:Anxious; Depressed Affect:Congruent  Thought Process  Thought Processes:Coherent Descriptions of Associations:Intact  Orientation:Full (Time, Place and Person)  Thought Content:Logical    Hallucinations:None  Ideas of Reference:None  Suicidal Thoughts:No  Homicidal Thoughts:No   Sensorium  Memory:Immediate Good; Recent Good; Remote Good Judgment:Good Insight:Good  Executive Functions  Concentration:Good Attention Span:Good [place]  Psychomotor Activity  Psychomotor Activity:Normal  Assets  Assets:Communication Skills; Desire for Improvement; Financial Resources/Insurance; Housing; Physical Health; Resilience; Social Support  Sleep  Sleep:Fair Number of hours: 7  No  data recorded  Physical Exam: Physical Exam Vitals and nursing note reviewed.  Constitutional:      General: She is not in acute distress.    Appearance: Normal appearance. She is not ill-appearing.  HENT:     Head: Normocephalic.  Eyes:     Conjunctiva/sclera: Conjunctivae normal.  Cardiovascular:     Rate and Rhythm: Normal rate.  Pulmonary:     Effort: Pulmonary effort is normal.  Musculoskeletal:        General: Normal range of motion.     Cervical back: Normal range of motion.  Skin:    General: Skin is warm and dry.  Neurological:     Mental Status: She is alert and oriented to person, place, and time.  Psychiatric:        Attention and Perception: Attention and perception normal.        Mood and Affect: Mood is anxious and depressed.        Speech: Speech normal.        Behavior: Behavior is cooperative.        Thought Content: Thought content normal.        Cognition and Memory: Cognition normal.        Judgment: Judgment normal.    Review of Systems  Constitutional: Negative.   HENT: Negative.    Eyes: Negative.   Respiratory: Negative.    Cardiovascular: Negative.   Musculoskeletal: Negative.   Skin: Negative.   Neurological: Negative.   Psychiatric/Behavioral:  Positive for depression. The patient is nervous/anxious.    Blood pressure 134/84, pulse 63, temperature 98.4 F (36.9 C), temperature source Oral, resp. rate 18, SpO2 94 %. There is no height or weight on file to calculate BMI.  Musculoskeletal: Strength & Muscle Tone: within normal limits Gait & Station: normal Patient leans: N/A   [place] [facility] Discharge Disposition for Follow up and Recommendations: Based on my evaluation the patient does not appear to have an emergency medical condition and can be discharged with resources and follow up care in outpatient services for Medication Management and Individual Therapy  Discharge patient  Provided 30-day supply of 25 mg Zoloft to add to her current  25 mg packets to make a total of 50 mg of Zoloft per day  Patient has a therapy appointment 04/12/2022.  At [facility]  Provided outpatient psychiatric resources for medication management.  No evidence of imminent risk to self or others at present.    Patient does not meet criteria for psychiatric inpatient admission. Discussed crisis plan, support from social network, calling 911, coming to the Emergency Department, and calling Suicide Hotline.   Revonda Humphrey, NP 03/08/2022, 7:13 PM

## 2022-03-07 NOTE — ED Notes (Signed)
Patient was discharged by provider. Patient was given an AVS with community resources.

## 2022-03-07 NOTE — BH Assessment (Signed)
[name], Routine; 50 year presents this date with her daughter Shailynn Fong, [phone].  Pt denies SI, HI or AVH/and Drug/Alcohol use. Pt presents hopelessness, loss of interests and fatigue.  Pt denies any prior MH diagnosis or prescribed medication for symptom management.  MSE signed by patient.

## 2022-03-22 ENCOUNTER — Other Ambulatory Visit: Payer: Self-pay | Admitting: Family Medicine

## 2022-03-22 DIAGNOSIS — J301 Allergic rhinitis due to pollen: Secondary | ICD-10-CM

## 2022-03-23 ENCOUNTER — Telehealth: Payer: Self-pay | Admitting: Pharmacist

## 2022-03-23 NOTE — Chronic Care Management (AMB) (Signed)
Chronic Care Management Pharmacy Assistant   Name: [name]  MRN: [id number] DOB: [birth year]  Reason for Encounter: Medication Review / Medication Coordination Call   Recent office visits:  None  Recent consult visits:  02/26/2022 Erroll Luna MD (surgery) - Patient was seen for papilloma of right breast. No medication changes.   Hospital visits:  Patient was seen at [facility] on 03/07/2022 (3 minutes) due to Severe episode of recurrent major depressive disorder, without psychotic features .    New?Medications Started at [facility] Discharge:?? No medications started Medication Changes at Hospital Discharge: Discussed increasing Zoloft to 50 mg daily and patient is in agreement.  Educated on medication and side effects patient verbalized understanding.  30-day supply of 25 mg was sent to patient's pharmacy of choice.  She is instructed to take 25 mg in addition to her current 25 mg to total of 50/day.  In addition her mail order pharmacy was contacted and was instructed to increase next month's dose of Zoloft in the prepackaged containers to 50 mg daily. Medications Discontinued at Hospital Discharge: No medications discontinued Medications that remain the same after Hospital Discharge:??  -All other medications will remain the same.    Medications: Outpatient Encounter Medications as of 03/23/2022  Medication Sig   acetaminophen (TYLENOL) 500 MG tablet Take 1,000 mg by mouth as needed for moderate pain or headache.    albuterol (VENTOLIN HFA) 108 (90 Base) MCG/ACT inhaler INHALE ONE OR TWO PUFFS BY MOUTH INTO THE LUNGS EVERY SIX HOURS AS NEEDED FOR WHEEZING OR SHORTNESS OF BREATH   Alirocumab (PRALUENT) 75 MG/ML SOAJ Inject 75 mg into the skin every 14 (fourteen) days.   aspirin 81 MG chewable tablet Chew 81 mg by mouth daily.   Continuous Blood Gluc Receiver (FREESTYLE LIBRE 2 READER) DEVI Use to check blood sugar, dx:e11.9   Continuous Blood Gluc  Sensor (FREESTYLE LIBRE 2 SENSOR) MISC Use to check blood sugar, dx:e11.9   ezetimibe (ZETIA) 10 MG tablet Take 1 tablet (10 mg total) by mouth daily.   famotidine (PEPCID) 20 MG tablet TAKE ONE TABLET BY MOUTH EVERY MORNING and TAKE ONE TABLET BY MOUTH EVERYDAY AT BEDTIME   fluticasone (FLONASE) 50 MCG/ACT nasal spray PLACE 2 SPRAYS INTO BOTH NOSTRILS AT BEDTIME AS NEEDED FOR ALLERGIES.   Fluticasone-Umeclidin-Vilant (TRELEGY ELLIPTA) 100-62.5-25 MCG/INH AEPB Inhale 1 puff into the lungs daily.   gabapentin (NEURONTIN) 300 MG capsule Take 2 capsules (600 mg total) by mouth 2 (two) times daily.   hydrochlorothiazide (HYDRODIURIL) 25 MG tablet TAKE ONE TABLET BY MOUTH ONCE DAILY   losartan (COZAAR) 100 MG tablet TAKE ONE TABLET BY MOUTH EVERY MORNING   metoprolol succinate (TOPROL-XL) 100 MG 24 hr tablet TAKE ONE TABLET BY MOUTH AT NOON   montelukast (SINGULAIR) 10 MG tablet TAKE ONE TABLET BY MOUTH EVERYDAY AT BEDTIME   MYRBETRIQ 50 MG TB24 tablet Take 50 mg by mouth daily.   nitroGLYCERIN (NITROSTAT) 0.4 MG SL tablet Place 1 tablet (0.4 mg total) under the tongue every 5 (five) minutes as needed for chest pain.   ondansetron (ZOFRAN-ODT) 4 MG disintegrating tablet '4mg'$  ODT q4 hours prn nausea/vomit   pantoprazole (PROTONIX) 40 MG tablet Take 1 tablet (40 mg total) by mouth daily.   polyethylene glycol powder (GLYCOLAX/MIRALAX) powder Take 17 g by mouth daily.   rosuvastatin (CRESTOR) 20 MG tablet Take 1 tablet (20 mg total) by mouth daily.   sertraline (ZOLOFT) 25 MG tablet Take 1 tablet (25  mg total) by mouth daily.   No facility-administered encounter medications on file as of 03/23/2022.   Reviewed chart for medication changes ahead of medication coordination call.  No OVs, Consults, or hospital visits since last care coordination call/Pharmacist visit. (If appropriate, list visit date, provider name)  No medication changes indicated OR if recent visit, treatment plan here.  BP Readings  from Last 3 Encounters:  03/07/22 134/84  02/01/22 130/70  01/22/22 130/80    Lab Results  Component Value Date   HGBA1C 5.9 04/17/2021     Patient obtains medications through Adherence Packaging  30 Days    Last adherence delivery included:  Praluent Inj 75 mg - inject 75 mg every 14 days Gabapentin 300 mg - take 2 tablets at lunch and 2 tablets at bedtime Ezetimibe 10 mg  - take one tablet at breakfast Famotidine 20 mg - take one tablet at breakfast and one tablet at bedtime Rosuvastatin 20 mg - take one tablet at bedtime Metoprolol Suc 100 mg - take one tablet at lunch Losartan 100 mg - take one tablet at breakfast HCTZ 25 mg - take one tablet at breakfast Montelukast 10 mg - take one tablet at bedtime Myrbetriq 50 mg - take one tablet at breakfast Sertraline 25 mg - take one tablet at lunch   Patient declined the following medications:  No medications were denied   Patient is due for next adherence delivery on: 04/02/2022   Called patient and reviewed medications and coordinated delivery.   This delivery to include: Praluent Inj 75 mg - inject 75 mg every 14 days Gabapentin 300 mg - take 2 tablets at lunch and bedtime Ezetimibe 10 mg  - take one tablet at breakfast Famotidine 20 mg - take one tablet at breakfast and bedtime Rosuvastatin 20 mg - take one tablet at bedtime Metoprolol Suc 100 mg - take one tablet at lunch Losartan 100 mg - take one tablet at breakfast HCTZ 25 mg - take one tablet at breakfast Montelukast 10 mg - take one tablet at bedtime Myrbetriq 50 mg - take one tablet at breakfast Sertraline 50 mg - take one tablet at lunch (New dose of 50 mg tablet will start with new packs on 04/02/22 Patient is aware and voices clear understanding)   Patient will need a short fill: No short fills needed   Coordinated acute fill: No acute fills needed   Patient declined the following medications:  No declined medications.   Confirmed delivery date 04/02/2022,  advised patient that pharmacy will contact them the morning of delivery.  Care Gaps: AWV - scheduled 08/30/2022 Last BP -  134/84 on 03/07/2022 Last A1C - 5.9 on 04/17/2022 Shingrix - never done Covid booster - overdue HGA1C - overdue Tdap - overdue Flu - due  Star Rating Drugs: Losartan 100 mg - Last filled 02/25/2022 30 DS at [facility] Rosuvastatin 20 mg - Last filled 02/25/2022 30 DS at [facility] [phone]

## 2022-03-23 NOTE — Progress Notes (Unsigned)
HPI: [name] is a 76 y.o. female, who is here today with her daughter to follow on recent ED visit.  Since her last visit she was evaluated in the ED for episode of worsening depression and anxiety, 03/07/2022. Crying spells for no particular reason, lack of motivation, fatigue, and being in bed most of the day. Denies suicidal thoughts.  Sertraline was increased from 25 mg to 50 mg daily. In general she has tolerated medication well, except for having mild nausea. She has not identified exacerbating factors. She is planning on having right breast lumpectomy.  According to patient, right breast biopsy was negative for malignancy but she would like lesion to be removed.  She was instructed to follow-up with [facility],and [facility]. She has an appointment on 04/12/2022.  She has noted some improvement, sleep better last night, 9 hours. Usually she wakes up around 3:30-4 am.  Hypertension:  Medications: On metoprolol succinate 100 mg daily, losartan 100 mg daily, and hydrochlorothiazide 25 mg daily. Negative for unusual or severe headache, visual changes, exertional chest pain, dyspnea,  focal weakness, or worsening edema.  Lab Results  Component Value Date   CREATININE 1.70 (H) 11/05/2021   BUN 19 11/05/2021   NA 137 11/05/2021   K 4.2 11/05/2021   CL 103 11/05/2021   CO2 28 11/05/2021   DM II:Dx'ed 8+ years ago. Currently she is on nonpharmacologic treatment. Negative for polydipsia, polyuria, polyphagia. She checks BS a few times per week, last checked about 2 weeks ago. BS between 90 and low 110s. Peripheral neuropathy, she follows with neurologist, she is on gabapentin 300 mg 2 caps bid.  Review of Systems  Constitutional:  Positive for activity change and appetite change. Negative for fever.  HENT:  Negative for mouth sores and nosebleeds.   Respiratory:  Negative for cough and wheezing.   Cardiovascular:   Negative for palpitations.  Gastrointestinal:  Negative for abdominal pain, nausea and vomiting.       Negative for changes in bowel habits.  Genitourinary:  Negative for decreased urine volume and hematuria.  Neurological:  Negative for seizures, syncope, weakness, numbness and headaches.  Psychiatric/Behavioral:  Negative for confusion and hallucinations. The patient is nervous/anxious.   Rest see pertinent positives and negatives per HPI.  Current Outpatient Medications on File Prior to Visit  Medication Sig Dispense Refill   acetaminophen (TYLENOL) 500 MG tablet Take 1,000 mg by mouth as needed for moderate pain or headache.      albuterol (VENTOLIN HFA) 108 (90 Base) MCG/ACT inhaler INHALE ONE OR TWO PUFFS BY MOUTH INTO THE LUNGS EVERY SIX HOURS AS NEEDED FOR WHEEZING OR SHORTNESS OF BREATH 8.5 g 12   Alirocumab (PRALUENT) 75 MG/ML SOAJ Inject 75 mg into the skin every 14 (fourteen) days. 2 mL 11   aspirin 81 MG chewable tablet Chew 81 mg by mouth daily.     Continuous Blood Gluc Receiver (FREESTYLE LIBRE 2 READER) DEVI Use to check blood sugar, dx:e11.9 1 each 2   Continuous Blood Gluc Sensor (FREESTYLE LIBRE 2 SENSOR) MISC Use to check blood sugar, dx:e11.9 3 each 3   ezetimibe (ZETIA) 10 MG tablet Take 1 tablet (10 mg total) by mouth daily. 90 tablet 3   famotidine (PEPCID) 20 MG tablet TAKE ONE TABLET BY MOUTH EVERY MORNING and TAKE ONE TABLET BY MOUTH EVERYDAY AT BEDTIME 180 tablet 2   fluticasone (FLONASE) 50 MCG/ACT nasal spray PLACE 2 SPRAYS INTO BOTH NOSTRILS  AT BEDTIME AS NEEDED FOR ALLERGIES. 48 mL 1   Fluticasone-Umeclidin-Vilant (TRELEGY ELLIPTA) 100-62.5-25 MCG/INH AEPB Inhale 1 puff into the lungs daily. 60 each 5   gabapentin (NEURONTIN) 300 MG capsule Take 2 capsules (600 mg total) by mouth 2 (two) times daily. 360 capsule 4   hydrochlorothiazide (HYDRODIURIL) 25 MG tablet TAKE ONE TABLET BY MOUTH ONCE DAILY 90 tablet 2   losartan (COZAAR) 100 MG tablet TAKE ONE TABLET BY  MOUTH EVERY MORNING 90 tablet 2   metoprolol succinate (TOPROL-XL) 100 MG 24 hr tablet TAKE ONE TABLET BY MOUTH AT NOON 90 tablet 1   montelukast (SINGULAIR) 10 MG tablet TAKE ONE TABLET BY MOUTH EVERYDAY AT BEDTIME 30 tablet 3   MYRBETRIQ 50 MG TB24 tablet Take 50 mg by mouth daily.     nitroGLYCERIN (NITROSTAT) 0.4 MG SL tablet Place 1 tablet (0.4 mg total) under the tongue every 5 (five) minutes as needed for chest pain. 25 tablet 2   ondansetron (ZOFRAN-ODT) 4 MG disintegrating tablet '4mg'$  ODT q4 hours prn nausea/vomit 20 tablet 0   polyethylene glycol powder (GLYCOLAX/MIRALAX) powder Take 17 g by mouth daily. 500 g 3   rosuvastatin (CRESTOR) 20 MG tablet Take 1 tablet (20 mg total) by mouth daily. 90 tablet 3   sertraline (ZOLOFT) 50 MG tablet Take 50 mg by mouth daily.     pantoprazole (PROTONIX) 40 MG tablet Take 1 tablet (40 mg total) by mouth daily. 30 tablet 1   No current facility-administered medications on file prior to visit.   Past Medical History:  Diagnosis Date   Allergy    Anxiety    C. difficile diarrhea    history of   CAD (coronary artery disease), native coronary artery    a. 05/2017 showed 100% mLCx tx with DES, otherwise 20% mRCA, 50% ramus, 30% dLAD, 20% prox-mid LAD, 20% mLAD treated medically.    Cancer [facility])    Right kidney CA removed.    CKD (chronic kidney disease) stage 3, GFR 30-59 ml/min ([place]) 10/11/2016   s/p R nephrectomy   Colon polyps 2008   HYPERPLASTIC   Diabetes mellitus without complication ([place])    type 2  no meds   Gastritis    GERD (gastroesophageal reflux disease)    History of echocardiogram    Echo 3/18:  Moderate LVH, EF 62-13, grade 1 diastolic dysfunction, calcified aortic valve, mild MR, moderate LAE   History of nuclear stress test    Myoview 3/18: Mod size and intensity fixed septal defect, may be artifact.Opposite mod size and intensity lat defect, which is reversible and could represent ischemia or possibly artifact (SDS 4).  LVEF 71% with normal wall motion. Intermediate risk study. >> images reviewed with Dr. Dorris Carnes - no sig ischemia; med rx    Hx of cardiovascular stress test    Lexiscan Myoview 6/16:  EF 70%, no scar or ischemia; Low Risk   Hyperlipidemia    Hypertension    Neuropathy    Orthostatic hypotension 05/28/2017   OSA (obstructive sleep apnea) 01/16/2021   Osteoarthritis    Rheumatoid arthritis ([place])    RA (Dr. Ouida Sills) Bilateral hands   S/P angioplasty with stent 05/27/17 to LCX with DES  05/28/2017   Sleep apnea 2023   Thyroid disease    Allergies  Allergen Reactions   Oxycodone-Acetaminophen Hives and Itching    hallucinations   Percocet [Oxycodone-Acetaminophen] Hives, Itching and Other (See Comments)    hallucinations   Pravastatin Sodium Other (See  Comments)    Muscle aches Muscle aches   Hydrocodone Other (See Comments)    "crazy dreams"   Aspirin Other (See Comments)    GI upset Other reaction(s): Other (See Comments) REACTION: GI upset    Social History   Socioeconomic History   Marital status: Divorced    Spouse name: Not on file   Number of children: 3   Years of education: 12   Highest education level: Not on file  Occupational History   Occupation: Retired    Fish farm manager: RETIRED  Tobacco Use   Smoking status: Former    Packs/day: 0.20    Years: 15.00    Total pack years: 3.00    Types: Cigarettes    Start date: 1970    Quit date: 09/07/2021    Years since quitting: 0.5   Smokeless tobacco: Never   Tobacco comments:    pt has been an on and off smoker since age 17  Vaping Use   Vaping Use: Never used  Substance and Sexual Activity   Alcohol use: No    Alcohol/week: 0.0 standard drinks of alcohol   Drug use: No   Sexual activity: Not on file  Other Topics Concern   Not on file  Social History Narrative   Single, dgtr lives with her   Current on/off smoker   Alcohol use- no   Drug use-no   Regular Exercise-yes   Social Determinants of Health    Financial Resource Strain: Low Risk  (08/27/2021)   Overall Financial Resource Strain (CARDIA)    Difficulty of Paying Living Expenses: Not hard at all  Food Insecurity: No Food Insecurity (08/27/2021)   Hunger Vital Sign    Worried About Running Out of Food in the Last Year: Never true    [place] in the Last Year: Never true  Transportation Needs: No Transportation Needs (08/27/2021)   PRAPARE - Hydrologist (Medical): No    Lack of Transportation (Non-Medical): No  Physical Activity: Inactive (08/27/2021)   Exercise Vital Sign    Days of Exercise per Week: 0 days    Minutes of Exercise per Session: 0 min  Stress: No Stress Concern Present (08/27/2021)   [place]    Feeling of Stress : Only a little  Social Connections: Moderately Integrated (08/27/2021)   Social Connection and Isolation Panel [NHANES]    Frequency of Communication with Friends and Family: More than three times a week    Frequency of Social Gatherings with Friends and Family: More than three times a week    Attends Religious Services: More than 4 times per year    Active Member of [employer] or Organizations: Yes    Attends Archivist Meetings: More than 4 times per year    Marital Status: Divorced   Vitals:   03/24/22 1441  BP: 110/70  Pulse: 63  Resp: 12  Temp: 98 F (36.7 C)  SpO2: 97%   Body mass index is 36.82 kg/m.  Physical Exam Vitals and nursing note reviewed.  Constitutional:      General: She is not in acute distress.    Appearance: She is well-developed.  HENT:     Head: Normocephalic and atraumatic.  Eyes:     Conjunctiva/sclera: Conjunctivae normal.  Cardiovascular:     Rate and Rhythm: Normal rate and regular rhythm.     Pulses:  Dorsalis pedis pulses are 2+ on the right side and 2+ on the left side.     Heart sounds: Murmur (Soft SEM RUSB-LUSB) heard.     Comments:  Trace pitting LE edema, bilateral. Pulmonary:     Effort: Pulmonary effort is normal. No respiratory distress.     Breath sounds: Normal breath sounds.  Abdominal:     Palpations: Abdomen is soft. There is no mass.     Tenderness: There is no abdominal tenderness.  Skin:    General: Skin is warm.     Findings: No erythema or rash.  Neurological:     General: No focal deficit present.     Mental Status: She is alert and oriented to person, place, and time.     Cranial Nerves: No cranial nerve deficit.     Gait: Gait normal.  Psychiatric:     Comments: Well groomed, good eye contact.   ASSESSMENT AND PLAN:  Ms.Maleka was seen today for follow-up.  Diagnoses and all orders for this visit:  Orders Placed This Encounter  Procedures   POC HgB A1c   Lab Results  Component Value Date   HGBA1C 6.3 03/24/2022   DM (diabetes mellitus), type 2 with renal complications ([place]) ZOX0R went up from 5.9 to 6.3. Continue nonpharmacologic treatment. Annual eye exam, periodic dental and foot care recommended. F/U in 5-6 months.  Anxiety disorder, unspecified Problem has improved. Continue sertraline 50 mg daily. Keep appointment with psychiatrist.  Moderate episode of recurrent major depressive disorder ([place]) Reporting mild improvement in symptoms. Continue sertraline 50 mg daily. We discussed some side effects of medication, hopefully nausea will improve in a few days. Instructed about warning signs. She has an appointment with psychiatrist on 04/12/2022.  Hypertension with heart disease Today BP adequately controlled,SBP on lower normal range, recommend monitoring BP at home. Continue metoprolol succinate 100 mg daily, losartan 100 mg daily, and hydrochlorothiazide 25 mg daily as well as low salt diet. Follow-up in 6 months, before if needed.  Malignant neoplasm of kidney excluding renal pelvis, unspecified laterality ([place]) S/P right nephrectomy in 2010, not longer following with  urologist.  Return in about 6 months (around 09/24/2022).  Othmar Ringer G. [name], MD  [facility]. [place] office.

## 2022-03-24 ENCOUNTER — Encounter: Payer: Self-pay | Admitting: Family Medicine

## 2022-03-24 ENCOUNTER — Ambulatory Visit (INDEPENDENT_AMBULATORY_CARE_PROVIDER_SITE_OTHER): Payer: Medicare HMO | Admitting: Family Medicine

## 2022-03-24 VITALS — BP 110/70 | HR 63 | Temp 98.0°F | Resp 12 | Ht 66.0 in | Wt 228.1 lb

## 2022-03-24 DIAGNOSIS — F331 Major depressive disorder, recurrent, moderate: Secondary | ICD-10-CM | POA: Diagnosis not present

## 2022-03-24 DIAGNOSIS — E1122 Type 2 diabetes mellitus with diabetic chronic kidney disease: Secondary | ICD-10-CM

## 2022-03-24 DIAGNOSIS — F419 Anxiety disorder, unspecified: Secondary | ICD-10-CM

## 2022-03-24 DIAGNOSIS — C649 Malignant neoplasm of unspecified kidney, except renal pelvis: Secondary | ICD-10-CM | POA: Diagnosis not present

## 2022-03-24 DIAGNOSIS — N183 Chronic kidney disease, stage 3 unspecified: Secondary | ICD-10-CM

## 2022-03-24 DIAGNOSIS — R69 Illness, unspecified: Secondary | ICD-10-CM | POA: Diagnosis not present

## 2022-03-24 DIAGNOSIS — I119 Hypertensive heart disease without heart failure: Secondary | ICD-10-CM | POA: Diagnosis not present

## 2022-03-24 LAB — POCT GLYCOSYLATED HEMOGLOBIN (HGB A1C): HbA1c, POC (prediabetic range): 6.3 % (ref 5.7–6.4)

## 2022-03-24 NOTE — Assessment & Plan Note (Signed)
Problem has improved. Continue sertraline 50 mg daily. Keep appointment with psychiatrist.

## 2022-03-24 NOTE — Assessment & Plan Note (Signed)
HgA1C went up from 5.9 to 6.3. Continue nonpharmacologic treatment. Annual eye exam, periodic dental and foot care recommended. F/U in 5-6 months.

## 2022-03-24 NOTE — Assessment & Plan Note (Addendum)
Reporting mild improvement in symptoms. Continue sertraline 50 mg daily. We discussed some side effects of medication, hopefully nausea will improve in a few days. Instructed about warning signs. She has an appointment with psychiatrist on 04/12/2022.

## 2022-03-24 NOTE — Assessment & Plan Note (Addendum)
Today BP adequately controlled,SBP on lower normal range, recommend monitoring BP at home. Continue metoprolol succinate 100 mg daily, losartan 100 mg daily, and hydrochlorothiazide 25 mg daily as well as low salt diet. Follow-up in 6 months, before if needed.

## 2022-03-24 NOTE — Patient Instructions (Addendum)
A few things to remember from today's visit:   Moderate episode of recurrent major depressive disorder (HCC)  Anxiety disorder, unspecified type  Type 2 diabetes mellitus with stage 3 chronic kidney disease, without long-term current use of insulin, unspecified whether stage 3a or 3b CKD ([place]) - Plan: POC HgB A1c  If you need refills please call your pharmacy. Do not use My Chart to request refills or for acute issues that need immediate attention.   No changes today. Continue Sertraline 50 mg daily. Keep appt with psychiatrist.  Please be sure medication list is accurate. If a new problem present, please set up appointment sooner than planned today.

## 2022-03-25 ENCOUNTER — Encounter: Payer: Self-pay | Admitting: Family Medicine

## 2022-03-25 NOTE — Addendum Note (Signed)
Addended by: Viona Gilmore on: 03/25/2022 08:27 AM   Modules accepted: Orders

## 2022-04-07 ENCOUNTER — Encounter (HOSPITAL_BASED_OUTPATIENT_CLINIC_OR_DEPARTMENT_OTHER): Payer: Self-pay | Admitting: Surgery

## 2022-04-07 ENCOUNTER — Other Ambulatory Visit: Payer: Self-pay

## 2022-04-08 ENCOUNTER — Encounter (HOSPITAL_BASED_OUTPATIENT_CLINIC_OR_DEPARTMENT_OTHER)
Admission: RE | Admit: 2022-04-08 | Discharge: 2022-04-08 | Disposition: A | Discharge status: Home / Self Care | Payer: Medicare HMO | Source: Ambulatory Visit | Attending: Surgery | Admitting: Surgery

## 2022-04-08 DIAGNOSIS — I119 Hypertensive heart disease without heart failure: Secondary | ICD-10-CM | POA: Diagnosis not present

## 2022-04-08 DIAGNOSIS — Z01812 Encounter for preprocedural laboratory examination: Secondary | ICD-10-CM | POA: Diagnosis not present

## 2022-04-08 LAB — BASIC METABOLIC PANEL
Anion gap: 6 (ref 5–15)
BUN: 22 mg/dL (ref 8–23)
CO2: 29 mmol/L (ref 22–32)
Calcium: 9.1 mg/dL (ref 8.9–10.3)
Chloride: 105 mmol/L (ref 98–111)
Creatinine, Ser: 1.72 mg/dL — ABNORMAL HIGH (ref 0.44–1.00)
GFR, Estimated: 30 mL/min — ABNORMAL LOW (ref >60)
Glucose, Bld: 125 mg/dL — ABNORMAL HIGH (ref 70–99)
Potassium: 4.2 mmol/L (ref 3.5–5.1)
Sodium: 140 mmol/L (ref 135–145)

## 2022-04-08 NOTE — Progress Notes (Signed)
Gave patient CHG soap with instructions, patient verbalized understanding.       Patient Instructions  The night before surgery:  No food after midnight. ONLY clear liquids after midnight  The day of surgery (if you do NOT have diabetes):  Drink ONE (1) Pre-Surgery Clear Ensure as directed.   This drink was given to you during your hospital  pre-op appointment visit. The pre-op nurse will instruct you on the time to drink the  Pre-Surgery Ensure depending on your surgery time. Finish the drink at the designated time by the pre-op nurse.  Nothing else to drink after completing the  Pre-Surgery Clear Ensure.  The day of surgery (if you have diabetes): Drink ONE (1) Gatorade 2 (G2) as directed. This drink was given to you during your hospital  pre-op appointment visit.  The pre-op nurse will instruct you on the time to drink the   Gatorade 2 (G2) depending on your surgery time. Color of the Gatorade may vary. Red is not allowed. Nothing else to drink after completing the  Gatorade 2 (G2).         If you have questions, please contact your surgeon's office. 

## 2022-04-12 ENCOUNTER — Ambulatory Visit (INDEPENDENT_AMBULATORY_CARE_PROVIDER_SITE_OTHER): Payer: Medicare HMO | Admitting: Psychologist

## 2022-04-12 ENCOUNTER — Telehealth: Payer: Self-pay | Admitting: Internal Medicine

## 2022-04-12 DIAGNOSIS — R69 Illness, unspecified: Secondary | ICD-10-CM | POA: Diagnosis not present

## 2022-04-12 DIAGNOSIS — R928 Other abnormal and inconclusive findings on diagnostic imaging of breast: Secondary | ICD-10-CM | POA: Diagnosis not present

## 2022-04-12 DIAGNOSIS — F331 Major depressive disorder, recurrent, moderate: Secondary | ICD-10-CM

## 2022-04-12 NOTE — Progress Notes (Signed)
[facility] Counselor Initial Adult Exam  Name: [name] Date: 04/12/2022 MRN: [id number] DOB: 1946-01-06 PCP: [name], [name], MD  Time spent: 11:03 am to 11:43 am; total time: 40 minutes  This session was held via in person. The patient consented to in-person therapy and was in the clinician's office. Limits of confidentiality were discussed with the patient.   Guardian/Payee:  NA    Paperwork requested: No   Reason for Visit /Presenting Problem: Depression  Mental Status Exam: Appearance:   Well Groomed     Behavior:  Appropriate  Motor:  Normal  Speech/Language:   Clear and Coherent  Affect:  Appropriate  Mood:  normal  Thought process:  normal  Thought content:    WNL  Sensory/Perceptual disturbances:    WNL  Orientation:  oriented to person, place, and time/date  Attention:  Good  Concentration:  Good  Memory:  WNL  Fund of knowledge:   Good  Insight:    Good  Judgment:   Good  Impulse Control:  Good     Reported Symptoms:  The patient endorsed experiencing the following: feeling down, sad, tearful, rumination of negative thoughts, difficulty with sleep, lack of motivation, social isolation, avoiding pleasurable activities, and thoughts of hopelessness. She denied suicidal and homicidal ideation.   Risk Assessment: Danger to Self:  No Self-injurious Behavior: No Danger to Others: No Duty to Warn:no Physical Aggression / Violence:No  Access to Firearms a concern: No  Gang Involvement:No  Patient / guardian was educated about steps to take if suicide or homicide risk level increases between visits: n/a While future psychiatric events cannot be accurately predicted, the patient does not currently require acute inpatient psychiatric care and does not currently meet [facility] involuntary commitment criteria.  Substance Abuse History: Current substance abuse: No     Past Psychiatric History:   No previous psychological problems have been  observed Outpatient Providers:NA History of Psych Hospitalization: No  Psychological Testing:  NA    Abuse History:  Victim of: No.,  NA    Report needed: No. Victim of Neglect:No. Perpetrator of  NA   Witness / Exposure to Domestic Violence: No   Protective Services Involvement: No  Witness to [employer] Violence:  No   Family History:  Family History  Problem Relation Age of Onset   Rheum arthritis Mother    Stroke Mother    Prostate cancer Father    Heart disease Father    Diabetes Brother    Dementia Brother    Parkinsonism Brother    Kidney disease Son    Diabetes Brother    Cancer Brother    Dementia Brother    Colon cancer Neg Hx    Esophageal cancer Neg Hx    Pancreatic cancer Neg Hx    Liver disease Neg Hx    Rectal cancer Neg Hx    Stomach cancer Neg Hx     Living situation: the patient lives with their family  Sexual Orientation: Straight  Relationship Status: divorced  Name of spouse / other:NA If a parent, number of children / ages:Patient has two sons and one daughter. She has two grandchildren who live in [place]: Daughter and son  Financial Stress:  No   Income/Employment/Disability: Actor: No   Educational History: Education: high school diploma/GED  Religion/Sprituality/World View: Christian/Baptist  Any cultural differences that may affect / interfere with treatment:  not applicable   Recreation/Hobbies: Being with daughter.  Attending events at church  Stressors: Other: Unmotivated to participate in activities. Concern over son's health    Strengths: Supportive Relationships  Barriers:  NA   Legal History: Pending legal issue / charges: The patient has no significant history of legal issues. History of legal issue / charges:  NA  Medical History/Surgical History: reviewed Past Medical History:  Diagnosis Date   Allergy    Anxiety    C. difficile diarrhea    history  of   CAD (coronary artery disease), native coronary artery    a. 05/2017 showed 100% mLCx tx with DES, otherwise 20% mRCA, 50% ramus, 30% dLAD, 20% prox-mid LAD, 20% mLAD treated medically.    [facility]cer [facility])    Right kidney CA removed.    CKD (chronic kidney disease) stage 3, GFR 30-59 ml/min (HCC) 10/11/2016   s/p R nephrectomy   Colon polyps 2008   HYPERPLASTIC   COPD (chronic obstructive pulmonary disease) ([place])    former smoker   Gastritis    GERD (gastroesophageal reflux disease)    History of echocardiogram    Echo 3/18:  Moderate LVH, EF 79-89, grade 1 diastolic dysfunction, calcified aortic valve, mild MR, moderate LAE   History of nuclear stress test    Myoview 3/18: Mod size and intensity fixed septal defect, may be artifact.Opposite mod size and intensity lat defect, which is reversible and could represent ischemia or possibly artifact (SDS 4). LVEF 71% with normal wall motion. Intermediate risk study. >> images reviewed with Dr. Dorris Carnes - no sig ischemia; med rx    Hx of cardiovascular stress test    Lexiscan Myoview 6/16:  EF 70%, no scar or ischemia; Low Risk   Hyperlipidemia    Hypertension    Neuropathy    Orthostatic hypotension 05/28/2017   OSA (obstructive sleep apnea) 01/16/2021   Osteoarthritis    Pre-diabetes    no meds   Rheumatoid arthritis (HCC)    RA (Dr. Ouida Sills) Bilateral hands   S/P angioplasty with stent 05/27/17 to LCX with DES  05/28/2017   Sleep apnea 2023   does not use CPAP   Thyroid disease     Past Surgical History:  Procedure Laterality Date   ABDOMINAL HYSTERECTOMY  1978   ANTERIOR CERVICAL DECOMP/DISCECTOMY FUSION N/A 05/29/2018   Procedure: ANTERIOR CERVICAL DECOMPRESSION FUSION - CERVICAL FIVE-CERVICAL SIX - CERVICAL SIX-CERVICAL SEVEN;  Surgeon: Earnie Larsson, MD;  Location: [place];  Service: Neurosurgery;  Laterality: N/A;   BACK SURGERY     x 2   CARPAL TUNNEL RELEASE Left    COLONOSCOPY W/ BIOPSIES AND POLYPECTOMY     Hx: of    CORONARY STENT INTERVENTION N/A 05/27/2017   Procedure: CORONARY STENT INTERVENTION;  Surgeon: Burnell Blanks, MD;  Location: [place] CV LAB;  Service: Cardiovascular;  Laterality: N/A;   ESOPHAGOGASTRODUODENOSCOPY     HNP     LEFT HEART CATH AND CORONARY ANGIOGRAPHY N/A 05/27/2017   Procedure: LEFT HEART CATH AND CORONARY ANGIOGRAPHY;  Surgeon: Burnell Blanks, MD;  Location: [place] [facility];  Service: Cardiovascular;  Laterality: N/A;   LUMBAR LAMINECTOMY/DECOMPRESSION MICRODISCECTOMY Left 02/12/2013   Procedure: LUMBAR TWO THREE, LUMBAR THREE FOUR, LUMBAR FOUR FIVE  LAMINECTOMY/DECOMPRESSION MICRODISCECTOMY 3 LEVELS;  Surgeon: Charlie Pitter, MD;  Location: [employer] [facility];  Service: Neurosurgery;  Laterality: Left;   NEPHRECTOMY Right 2010   10.rcc cancer   TOTAL KNEE ARTHROPLASTY Right    Redo    Medications: Current Outpatient Medications  Medication  Sig Dispense Refill   acetaminophen (TYLENOL) 500 MG tablet Take 1,000 mg by mouth as needed for moderate pain or headache.      albuterol (VENTOLIN HFA) 108 (90 Base) MCG/ACT inhaler INHALE ONE OR TWO PUFFS BY MOUTH INTO THE LUNGS EVERY SIX HOURS AS NEEDED FOR WHEEZING OR SHORTNESS OF BREATH 8.5 g 12   Alirocumab (PRALUENT) 75 MG/ML SOAJ Inject 75 mg into the skin every 14 (fourteen) days. 2 mL 11   aspirin 81 MG chewable tablet Chew 81 mg by mouth daily.     ezetimibe (ZETIA) 10 MG tablet Take 1 tablet (10 mg total) by mouth daily. 90 tablet 3   famotidine (PEPCID) 20 MG tablet TAKE ONE TABLET BY MOUTH EVERY MORNING and TAKE ONE TABLET BY MOUTH EVERYDAY AT BEDTIME 180 tablet 2   fluticasone (FLONASE) 50 MCG/ACT nasal spray PLACE 2 SPRAYS INTO BOTH NOSTRILS AT BEDTIME AS NEEDED FOR ALLERGIES. 48 mL 1   Fluticasone-Umeclidin-Vilant (TRELEGY ELLIPTA) 100-62.5-25 MCG/INH AEPB Inhale 1 puff into the lungs daily. 60 each 5   gabapentin (NEURONTIN) 300 MG capsule Take 2 capsules (600 mg total) by mouth 2 (two) times daily. 360  capsule 4   hydrochlorothiazide (HYDRODIURIL) 25 MG tablet TAKE ONE TABLET BY MOUTH ONCE DAILY 90 tablet 2   losartan (COZAAR) 100 MG tablet TAKE ONE TABLET BY MOUTH EVERY MORNING 90 tablet 2   metoprolol succinate (TOPROL-XL) 100 MG 24 hr tablet TAKE ONE TABLET BY MOUTH AT NOON 90 tablet 1   montelukast (SINGULAIR) 10 MG tablet TAKE ONE TABLET BY MOUTH EVERYDAY AT BEDTIME 30 tablet 3   MYRBETRIQ 50 MG TB24 tablet Take 50 mg by mouth daily.     nitroGLYCERIN (NITROSTAT) 0.4 MG SL tablet Place 1 tablet (0.4 mg total) under the tongue every 5 (five) minutes as needed for chest pain. 25 tablet 2   ondansetron (ZOFRAN-ODT) 4 MG disintegrating tablet '4mg'$  ODT q4 hours prn nausea/vomit 20 tablet 0   polyethylene glycol powder (GLYCOLAX/MIRALAX) powder Take 17 g by mouth daily. 500 g 3   rosuvastatin (CRESTOR) 20 MG tablet Take 1 tablet (20 mg total) by mouth daily. 90 tablet 3   sertraline (ZOLOFT) 50 MG tablet Take 50 mg by mouth daily.     No current facility-administered medications for this visit.    Allergies  Allergen Reactions   Oxycodone-Acetaminophen Hives and Itching    hallucinations   Percocet [Oxycodone-Acetaminophen] Hives, Itching and Other (See Comments)    hallucinations   Pravastatin Sodium Other (See Comments)    Muscle aches Muscle aches   Hydrocodone Other (See Comments)    "crazy dreams"   Aspirin Other (See Comments)    GI upset Other reaction(s): Other (See Comments) REACTION: GI upset    Diagnoses:  F33.1 major depressive affective disorder, recurrent, moderate  Plan of Care: The patient is a 76 year old Black woman who was referred due to experiencing depression. The patient lives with her daughter. The patient meets criteria for a diagnosis of F33.1 major depressive affective disorder, recurrent, moderate based off of the following: feeling down, sad, tearful, rumination of negative thoughts, difficulty with sleep, lack of motivation, social isolation, avoiding  pleasurable activities, and thoughts of hopelessness. She denied suicidal and homicidal ideation.   The patient stated that she wants to become active again and develop some coping skills to assist her.   This psychologist makes the recommendation that the patient participate in bi-weekly therapy to assist her in meeting her needs.  [name], PsyD

## 2022-04-12 NOTE — Plan of Care (Signed)
Goals Alleviate depressive symptoms Recognize, accept, and cope with depressive feelings Develop healthy thinking patterns Develop healthy interpersonal relationships  Objectives Cooperate with a medication evaluation by a physician Verbalize an accurate understanding of depression Verbalize an understanding of the treatment Identify and replace thoughts that support depression Learn and implement behavioral strategies Verbalize an understanding and resolution of current interpersonal problems Learn and implement problem solving and decision making skills Learn and implement conflict resolution skills to resolve interpersonal problems Verbalize an understanding of healthy and unhealthy emotions verbalize insight into how past relationships may be influence current experiences with depression Use mindfulness and acceptance strategies and increase value based behavior  Increase hopeful statements about the future.  Interventions Consistent with treatment model, discuss how change in cognitive, behavioral, and interpersonal can help client alleviate depression CBT Behavioral activation help the client explore the relationship, nature of the dispute,  Help the client develop new interpersonal skills and relationships Conduct Problem so living therapy Teach conflict resolution skills Use a process-experiential approach Conduct TLDP Conduct ACT Evaluate need for psychotropic medication Monitor adherence to medication   

## 2022-04-12 NOTE — Telephone Encounter (Signed)
Patient's daughter, Gordan Payment, states patient had to turn in her old CPAP machine due to face mask. Patient was suppose to get a new machine about 3 months ago, but never got a call. Patient would like to know if she needs to be using the machine before she comes in for an appointment (scheduled for 04/20/2022).  Please advise- call home number back at [phone] to leave voicemail.

## 2022-04-12 NOTE — Telephone Encounter (Signed)
We will discuss options at her next appointment

## 2022-04-12 NOTE — Progress Notes (Signed)
                Rose Moncada, PsyD 

## 2022-04-12 NOTE — Telephone Encounter (Signed)
Called and spoke with pt to see how long it has been since she had used her cpap machine and she said she has not had a cpap machine for the past 3 months. Pt said she had to turn her old cpap machine due to her mask bothering her and since she had not been using the machine, insurance made her return the machine to DME.  Pt has an upcoming appt with CY 9/19. Due to pt not wearing CPAP, pt wants to know if there is anything that needs to be done prior to her appt. Dr. Annamaria Boots, please advise.

## 2022-04-13 ENCOUNTER — Ambulatory Visit (HOSPITAL_BASED_OUTPATIENT_CLINIC_OR_DEPARTMENT_OTHER): Payer: Medicare HMO | Admitting: Certified Registered"

## 2022-04-13 ENCOUNTER — Encounter (HOSPITAL_BASED_OUTPATIENT_CLINIC_OR_DEPARTMENT_OTHER)
Admission: RE | Disposition: A | Discharge status: Home / Self Care | Payer: Self-pay | Source: Home / Self Care | Attending: Surgery

## 2022-04-13 ENCOUNTER — Encounter (HOSPITAL_BASED_OUTPATIENT_CLINIC_OR_DEPARTMENT_OTHER): Payer: Self-pay | Admitting: Surgery

## 2022-04-13 ENCOUNTER — Other Ambulatory Visit: Payer: Self-pay

## 2022-04-13 ENCOUNTER — Ambulatory Visit: Payer: Medicare HMO | Admitting: Psychologist

## 2022-04-13 ENCOUNTER — Ambulatory Visit (HOSPITAL_BASED_OUTPATIENT_CLINIC_OR_DEPARTMENT_OTHER)
Admission: RE | Admit: 2022-04-13 | Discharge: 2022-04-13 | Disposition: A | Discharge status: Home / Self Care | Payer: Medicare HMO | Attending: Surgery | Admitting: Surgery

## 2022-04-13 DIAGNOSIS — I2510 Atherosclerotic heart disease of native coronary artery without angina pectoris: Secondary | ICD-10-CM | POA: Diagnosis not present

## 2022-04-13 DIAGNOSIS — N6489 Other specified disorders of breast: Secondary | ICD-10-CM | POA: Diagnosis not present

## 2022-04-13 DIAGNOSIS — D241 Benign neoplasm of right breast: Secondary | ICD-10-CM

## 2022-04-13 DIAGNOSIS — N62 Hypertrophy of breast: Secondary | ICD-10-CM | POA: Diagnosis not present

## 2022-04-13 DIAGNOSIS — F32A Depression, unspecified: Secondary | ICD-10-CM | POA: Diagnosis not present

## 2022-04-13 DIAGNOSIS — Z6837 Body mass index (BMI) 37.0-37.9, adult: Secondary | ICD-10-CM | POA: Insufficient documentation

## 2022-04-13 DIAGNOSIS — I119 Hypertensive heart disease without heart failure: Secondary | ICD-10-CM

## 2022-04-13 DIAGNOSIS — K219 Gastro-esophageal reflux disease without esophagitis: Secondary | ICD-10-CM | POA: Diagnosis not present

## 2022-04-13 DIAGNOSIS — I10 Essential (primary) hypertension: Secondary | ICD-10-CM | POA: Diagnosis not present

## 2022-04-13 DIAGNOSIS — J449 Chronic obstructive pulmonary disease, unspecified: Secondary | ICD-10-CM | POA: Diagnosis not present

## 2022-04-13 DIAGNOSIS — E669 Obesity, unspecified: Secondary | ICD-10-CM | POA: Diagnosis not present

## 2022-04-13 DIAGNOSIS — R928 Other abnormal and inconclusive findings on diagnostic imaging of breast: Secondary | ICD-10-CM | POA: Diagnosis not present

## 2022-04-13 DIAGNOSIS — E119 Type 2 diabetes mellitus without complications: Secondary | ICD-10-CM | POA: Insufficient documentation

## 2022-04-13 DIAGNOSIS — R69 Illness, unspecified: Secondary | ICD-10-CM | POA: Diagnosis not present

## 2022-04-13 DIAGNOSIS — G4730 Sleep apnea, unspecified: Secondary | ICD-10-CM | POA: Diagnosis not present

## 2022-04-13 DIAGNOSIS — Z79899 Other long term (current) drug therapy: Secondary | ICD-10-CM | POA: Insufficient documentation

## 2022-04-13 DIAGNOSIS — Z955 Presence of coronary angioplasty implant and graft: Secondary | ICD-10-CM | POA: Insufficient documentation

## 2022-04-13 DIAGNOSIS — F419 Anxiety disorder, unspecified: Secondary | ICD-10-CM | POA: Diagnosis not present

## 2022-04-13 DIAGNOSIS — Z87891 Personal history of nicotine dependence: Secondary | ICD-10-CM | POA: Insufficient documentation

## 2022-04-13 HISTORY — DX: Chronic obstructive pulmonary disease, unspecified: J44.9

## 2022-04-13 HISTORY — DX: Prediabetes: R73.03

## 2022-04-13 HISTORY — PX: BREAST LUMPECTOMY WITH RADIOACTIVE SEED LOCALIZATION: SHX6424

## 2022-04-13 SURGERY — BREAST LUMPECTOMY WITH RADIOACTIVE SEED LOCALIZATION
Anesthesia: General | Site: Breast | Laterality: Right

## 2022-04-13 MED ORDER — ONDANSETRON HCL 4 MG/2ML IJ SOLN: 4.0000 mg | Freq: Once | INTRAMUSCULAR | Status: DC | PRN

## 2022-04-13 MED ORDER — BUPIVACAINE-EPINEPHRINE (PF) 0.25% -1:200000 IJ SOLN
INTRAMUSCULAR | Status: AC
  Filled 2022-04-13: qty 30

## 2022-04-13 MED ORDER — LIDOCAINE HCL (CARDIAC) PF 100 MG/5ML IV SOSY
PREFILLED_SYRINGE | INTRAVENOUS | Status: DC | PRN
  Administered 2022-04-13: 80 mg via INTRAVENOUS

## 2022-04-13 MED ORDER — PROPOFOL 10 MG/ML IV BOLUS
INTRAVENOUS | Status: AC
  Filled 2022-04-13: qty 20

## 2022-04-13 MED ORDER — CEFAZOLIN SODIUM-DEXTROSE 2-4 GM/100ML-% IV SOLN
2.0000 g | INTRAVENOUS | Status: AC
  Administered 2022-04-13: 2 g via INTRAVENOUS

## 2022-04-13 MED ORDER — EPHEDRINE 5 MG/ML INJ
INTRAVENOUS | Status: AC
  Filled 2022-04-13: qty 5

## 2022-04-13 MED ORDER — DEXAMETHASONE SODIUM PHOSPHATE 10 MG/ML IJ SOLN
INTRAMUSCULAR | Status: AC
  Filled 2022-04-13: qty 1

## 2022-04-13 MED ORDER — CEFAZOLIN SODIUM-DEXTROSE 2-4 GM/100ML-% IV SOLN
INTRAVENOUS | Status: AC
  Filled 2022-04-13: qty 100

## 2022-04-13 MED ORDER — ONDANSETRON HCL 4 MG/2ML IJ SOLN
INTRAMUSCULAR | Status: AC
  Filled 2022-04-13: qty 2

## 2022-04-13 MED ORDER — PROPOFOL 10 MG/ML IV BOLUS
INTRAVENOUS | Status: DC | PRN
  Administered 2022-04-13: 130 mg via INTRAVENOUS

## 2022-04-13 MED ORDER — ACETAMINOPHEN 500 MG PO TABS
ORAL_TABLET | ORAL | Status: AC
  Filled 2022-04-13: qty 2

## 2022-04-13 MED ORDER — LIDOCAINE 2% (20 MG/ML) 5 ML SYRINGE
INTRAMUSCULAR | Status: AC
  Filled 2022-04-13: qty 5

## 2022-04-13 MED ORDER — CHLORHEXIDINE GLUCONATE CLOTH 2 % EX PADS: 6.0000 | MEDICATED_PAD | Freq: Once | CUTANEOUS | Status: DC

## 2022-04-13 MED ORDER — ONDANSETRON HCL 4 MG/2ML IJ SOLN
INTRAMUSCULAR | Status: DC | PRN
  Administered 2022-04-13: 4 mg via INTRAVENOUS

## 2022-04-13 MED ORDER — EPHEDRINE SULFATE (PRESSORS) 50 MG/ML IJ SOLN
INTRAMUSCULAR | Status: DC | PRN
  Administered 2022-04-13 (×2): 5 mg via INTRAVENOUS
  Administered 2022-04-13: 10 mg via INTRAVENOUS

## 2022-04-13 MED ORDER — FENTANYL CITRATE (PF) 100 MCG/2ML IJ SOLN
INTRAMUSCULAR | Status: DC | PRN
  Administered 2022-04-13 (×2): 25 ug via INTRAVENOUS

## 2022-04-13 MED ORDER — BUPIVACAINE-EPINEPHRINE (PF) 0.25% -1:200000 IJ SOLN
INTRAMUSCULAR | Status: DC | PRN
  Administered 2022-04-13: 10 mL via PERINEURAL

## 2022-04-13 MED ORDER — ACETAMINOPHEN 500 MG PO TABS
1000.0000 mg | ORAL_TABLET | Freq: Once | ORAL | Status: AC
  Administered 2022-04-13: 1000 mg via ORAL

## 2022-04-13 MED ORDER — GENTAMICIN SULFATE 40 MG/ML IJ SOLN
INTRAMUSCULAR | Status: AC
  Filled 2022-04-13: qty 10

## 2022-04-13 MED ORDER — DEXAMETHASONE SODIUM PHOSPHATE 10 MG/ML IJ SOLN
INTRAMUSCULAR | Status: DC | PRN
  Administered 2022-04-13: 5 mg via INTRAVENOUS

## 2022-04-13 MED ORDER — TRAMADOL HCL 50 MG PO TABS: 50.0000 mg | ORAL_TABLET | Freq: Four times a day (QID) | ORAL | 0 refills | Status: DC | PRN

## 2022-04-13 MED ORDER — SODIUM CHLORIDE 0.9 % IV SOLN
INTRAVENOUS | Status: DC | PRN
  Administered 2022-04-13: 500 mL

## 2022-04-13 MED ORDER — GABAPENTIN 300 MG PO CAPS
300.0000 mg | ORAL_CAPSULE | ORAL | Status: AC
  Administered 2022-04-13: 300 mg via ORAL

## 2022-04-13 MED ORDER — FENTANYL CITRATE (PF) 100 MCG/2ML IJ SOLN: 25.0000 ug | INTRAMUSCULAR | Status: DC | PRN

## 2022-04-13 MED ORDER — LACTATED RINGERS IV SOLN: INTRAVENOUS | Status: DC

## 2022-04-13 MED ORDER — GABAPENTIN 300 MG PO CAPS
ORAL_CAPSULE | ORAL | Status: AC
  Filled 2022-04-13: qty 1

## 2022-04-13 MED ORDER — FENTANYL CITRATE (PF) 100 MCG/2ML IJ SOLN
INTRAMUSCULAR | Status: AC
  Filled 2022-04-13: qty 2

## 2022-04-13 SURGICAL SUPPLY — 49 items
ADH SKN CLS APL DERMABOND .7 (GAUZE/BANDAGES/DRESSINGS) ×1
APL PRP STRL LF DISP 70% ISPRP (MISCELLANEOUS) ×1
APPLIER CLIP 9.375 MED OPEN (MISCELLANEOUS)
APR CLP MED 9.3 20 MLT OPN (MISCELLANEOUS)
BINDER BREAST LRG (GAUZE/BANDAGES/DRESSINGS) IMPLANT
BINDER BREAST MEDIUM (GAUZE/BANDAGES/DRESSINGS) IMPLANT
BINDER BREAST XLRG (GAUZE/BANDAGES/DRESSINGS) IMPLANT
BINDER BREAST XXLRG (GAUZE/BANDAGES/DRESSINGS) IMPLANT
BLADE SURG 15 STRL LF DISP TIS (BLADE) ×1 IMPLANT
BLADE SURG 15 STRL SS (BLADE) ×1
CANISTER SUC SOCK COL 7IN (MISCELLANEOUS) IMPLANT
CANISTER SUCT 1200ML W/VALVE (MISCELLANEOUS) IMPLANT
CHLORAPREP W/TINT 26 (MISCELLANEOUS) ×1 IMPLANT
CLIP APPLIE 9.375 MED OPEN (MISCELLANEOUS) IMPLANT
COVER BACK TABLE 60X90IN (DRAPES) ×1 IMPLANT
COVER MAYO STAND STRL (DRAPES) ×1 IMPLANT
COVER PROBE W GEL 5X96 (DRAPES) ×1 IMPLANT
DERMABOND ADVANCED .7 DNX12 (GAUZE/BANDAGES/DRESSINGS) ×1 IMPLANT
DRAPE LAPAROSCOPIC ABDOMINAL (DRAPES) IMPLANT
DRAPE LAPAROTOMY 100X72 PEDS (DRAPES) ×1 IMPLANT
DRAPE UTILITY XL STRL (DRAPES) ×1 IMPLANT
ELECT COATED BLADE 2.86 ST (ELECTRODE) ×1 IMPLANT
ELECT REM PT RETURN 9FT ADLT (ELECTROSURGICAL) ×1
ELECTRODE REM PT RTRN 9FT ADLT (ELECTROSURGICAL) ×1 IMPLANT
GLOVE BIOGEL PI IND STRL 8 (GLOVE) ×1 IMPLANT
GLOVE ECLIPSE 8.0 STRL XLNG CF (GLOVE) ×1 IMPLANT
GOWN STRL REUS W/ TWL LRG LVL3 (GOWN DISPOSABLE) ×2 IMPLANT
GOWN STRL REUS W/ TWL XL LVL3 (GOWN DISPOSABLE) ×1 IMPLANT
GOWN STRL REUS W/TWL LRG LVL3 (GOWN DISPOSABLE) ×2
GOWN STRL REUS W/TWL XL LVL3 (GOWN DISPOSABLE) ×1
HEMOSTAT ARISTA ABSORB 3G PWDR (HEMOSTASIS) IMPLANT
HEMOSTAT SNOW SURGICEL 2X4 (HEMOSTASIS) IMPLANT
KIT MARKER MARGIN INK (KITS) ×1 IMPLANT
NDL HYPO 25X1 1.5 SAFETY (NEEDLE) ×1 IMPLANT
NEEDLE HYPO 25X1 1.5 SAFETY (NEEDLE) ×1 IMPLANT
NS IRRIG 1000ML POUR BTL (IV SOLUTION) ×1 IMPLANT
PACK BASIN DAY SURGERY FS (CUSTOM PROCEDURE TRAY) ×1 IMPLANT
PENCIL SMOKE EVACUATOR (MISCELLANEOUS) ×1 IMPLANT
SLEEVE SCD COMPRESS KNEE MED (STOCKING) ×1 IMPLANT
SPIKE FLUID TRANSFER (MISCELLANEOUS) IMPLANT
SPONGE T-LAP 4X18 ~~LOC~~+RFID (SPONGE) ×1 IMPLANT
SUT MNCRL AB 4-0 PS2 18 (SUTURE) ×1 IMPLANT
SUT SILK 2 0 SH (SUTURE) IMPLANT
SUT VICRYL 3-0 CR8 SH (SUTURE) ×1 IMPLANT
SYR CONTROL 10ML LL (SYRINGE) ×1 IMPLANT
TOWEL GREEN STERILE FF (TOWEL DISPOSABLE) ×1 IMPLANT
TRAY FAXITRON CT DISP (TRAY / TRAY PROCEDURE) ×1 IMPLANT
TUBE CONNECTING 20X1/4 (TUBING) IMPLANT
YANKAUER SUCT BULB TIP NO VENT (SUCTIONS) IMPLANT

## 2022-04-13 NOTE — Anesthesia Preprocedure Evaluation (Signed)
Anesthesia Evaluation  Patient identified by MRN, date of birth, ID band Patient awake    Reviewed: Allergy & Precautions, NPO status , Patient's Chart, lab work & pertinent test results, reviewed documented beta blocker date and time   Airway Mallampati: III  TM Distance: >3 FB Neck ROM: Full    Dental  (+) Teeth Intact, Dental Advisory Given   Pulmonary sleep apnea , COPD, former smoker,    Pulmonary exam normal breath sounds clear to auscultation       Cardiovascular hypertension, Pt. on home beta blockers and Pt. on medications (-) angina+ CAD, + Cardiac Stents and + Peripheral Vascular Disease  Normal cardiovascular exam Rhythm:Regular Rate:Normal     Neuro/Psych PSYCHIATRIC DISORDERS Anxiety Depression  Neuromuscular disease    GI/Hepatic Neg liver ROS, GERD  ,  Endo/Other  diabetes, Well Controlled, Type 2Obesity   Renal/GU Renal InsufficiencyRenal disease     Musculoskeletal  (+) Arthritis , Rheumatoid disorders,    Abdominal   Peds  Hematology negative hematology ROS (+)   Anesthesia Other Findings Day of surgery medications reviewed with the patient.  Reproductive/Obstetrics                             Anesthesia Physical Anesthesia Plan  ASA: 3  Anesthesia Plan: General   Post-op Pain Management: Tylenol PO (pre-op)* and Gabapentin PO (pre-op)*   Induction: Intravenous  PONV Risk Score and Plan: 3 and Dexamethasone and Ondansetron  Airway Management Planned: LMA  Additional Equipment:   Intra-op Plan:   Post-operative Plan: Extubation in OR  Informed Consent: I have reviewed the patients History and Physical, chart, labs and discussed the procedure including the risks, benefits and alternatives for the proposed anesthesia with the patient or authorized representative who has indicated his/her understanding and acceptance.     Dental advisory given  Plan  Discussed with: CRNA  Anesthesia Plan Comments:         Anesthesia Quick Evaluation

## 2022-04-13 NOTE — Anesthesia Postprocedure Evaluation (Signed)
Anesthesia Post Note  Patient: [name]  Procedure(s) Performed: RIGHT BREAST SEED LUMPECTOMY (Right: Breast)     Patient location during evaluation: PACU Anesthesia Type: General Level of consciousness: awake and alert Pain management: pain level controlled Vital Signs Assessment: post-procedure vital signs reviewed and stable Respiratory status: spontaneous breathing, nonlabored ventilation, respiratory function stable and patient connected to nasal cannula oxygen Cardiovascular status: blood pressure returned to baseline and stable Postop Assessment: no apparent nausea or vomiting Anesthetic complications: no   No notable events documented.  Last Vitals:  Vitals:   04/13/22 1228 04/13/22 1239  BP: (!) 172/81 (!) 159/79  Pulse: (!) 55   Resp: 16   Temp: 36.4 C   SpO2: 94%     Last Pain:  Vitals:   04/13/22 1228  TempSrc:   PainSc: 0-No pain                 Santa Lighter

## 2022-04-13 NOTE — Interval H&P Note (Signed)
History and Physical Interval Note:  04/13/2022 10:19 AM  [name]  has presented today for surgery, with the diagnosis of PAPILLOMA.  The various methods of treatment have been discussed with the patient and family. After consideration of risks, benefits and other options for treatment, the patient has consented to  Procedure(s): RIGHT BREAST SEED LUMPECTOMY (Right) as a surgical intervention.  The patient's history has been reviewed, patient examined, no change in status, stable for surgery.  I have reviewed the patient's chart and labs.  Questions were answered to the patient's satisfaction.    The procedure has been discussed with the patient. Alternatives to surgery have been discussed with the patient.  Risks of surgery include bleeding,  Infection,  Seroma formation, death,  and the need for further surgery.   The patient understands and wishes to proceed.   Turner Daniels MD

## 2022-04-13 NOTE — H&P (Signed)
History of Present Illness: [name] is a 76 y.o. female who is seen today as an office consultation for evaluation of IIH (New Patient) (Papilloma ) .   Patient sent for evaluation of abnormal mammogram. Patient noted to have a 5 mm papilloma centrally located right breast. Core biopsy showed papilloma. She has no complaints of pain discharge or mass. No family history of breast cancer. This was her first biopsy.   Review of Systems: A complete review of systems was obtained from the patient. I have reviewed this information and discussed as appropriate with the patient. See HPI as well for other ROS.    Medical History: Past Medical History:  Diagnosis Date  Anxiety  Arthritis  COPD (chronic obstructive pulmonary disease) (CMS-HCC)  History of cancer  Hypertension  Sleep apnea   There is no problem list on file for this patient.  Past Surgical History:  Procedure Laterality Date  NEPHRECTOMY    Allergies  Allergen Reactions  Oxycodone-Acetaminophen Hives, Itching and Unknown  hallucinations hallucinations  hallucinations  Codeine Swelling  Aspirin Unknown  REACTION: GI upset GI upset Other reaction(s): Other (See Comments) REACTION: GI upset  GI upset Other reaction(s): Other (See Comments) REACTION: GI upset   Current Outpatient Medications on File Prior to Visit  Medication Sig Dispense Refill  ezetimibe (ZETIA) 10 mg tablet Take 1 tablet by mouth every morning  famotidine (PEPCID) 20 MG tablet TAKE ONE TABLET BY MOUTH EVERY MORNING and TAKE ONE TABLET BY MOUTH EVERYDAY AT BEDTIME  hydroCHLOROthiazide (HYDRODIURIL) 25 MG tablet Take 1 tablet by mouth once daily  losartan (COZAAR) 100 MG tablet Take 1 tablet by mouth every morning  montelukast (SINGULAIR) 10 mg tablet TAKE ONE TABLET BY MOUTH EVERYDAY AT BEDTIME  rosuvastatin (CRESTOR) 20 MG tablet TAKE ONE TABLET BY MOUTH EVERYDAY AT BEDTIME  sertraline (ZOLOFT) 25 MG tablet TAKE ONE TABLET BY MOUTH AT  NOON   No current facility-administered medications on file prior to visit.   Family History  Problem Relation Age of Onset  Stroke Mother  High blood pressure (Hypertension) Father  Coronary Artery Disease (Blocked arteries around heart) Father  High blood pressure (Hypertension) Brother  Diabetes Brother    Social History   Tobacco Use  Smoking Status Former  Types: Cigarettes  Smokeless Tobacco Never    Social History   Socioeconomic History  Marital status: Divorced  Tobacco Use  Smoking status: Former  Types: Cigarettes  Smokeless tobacco: Never  Substance and Sexual Activity  Alcohol use: Never  Drug use: Never   Objective:   Vitals:  02/26/22 0938  BP: 130/76  Pulse: 72  Temp: 36.6 C (97.8 F)  SpO2: 97%  Weight: (!) 101.9 kg (224 lb 9.6 oz)  Height: 167.6 cm ('5\' 6"'$ )   Body mass index is 36.25 kg/m.  Physical Exam Constitutional:  Appearance: Normal appearance.  Eyes:  Pupils: Pupils are equal, round, and reactive to light.  Chest:  Breasts: Right: Normal.  Left: Normal.  Musculoskeletal:  General: Normal range of motion.  Lymphadenopathy:  Upper Body:  Right upper body: No supraclavicular or axillary adenopathy.  Left upper body: No supraclavicular or axillary adenopathy.  Neurological:  General: No focal deficit present.  Mental Status: She is alert and oriented to person, place, and time.  Psychiatric:  Mood and Affect: Mood normal.  Behavior: Behavior normal.    Labs, Imaging and Diagnostic Testing: Diagnosis Breast, right, needle core biopsy - INTRADUCTAL PAPILLOMA WITH USUAL DUCTAL HYPERPLASIA - NO ATYPIA  OR MALIGNANCY IDENTIFIED Microscopic Comment These results were called to [facility] on February 04, 2022. Thressa Sheller MD Pathologist, Electronic Signature (Case signed 02/05/2022)  Assessment and Plan:   Diagnoses and all orders for this visit:  Papilloma of right breast    Discussed observation versus removal of  the papilloma. Reviewed literature about malignant rates and given small size and central location and risk of malignancy is probably under 3%. She would like to have removed though. Discussed right breast seed lumpectomy. Risks and benefits reviewed.The procedure has been discussed with the patient. Alternatives to surgery have been discussed with the patient. Risks of surgery include bleeding, Infection, Seroma formation, death, and the need for further surgery. The patient understands and wishes to proceed.   No follow-ups on file.  Kennieth Francois, MD

## 2022-04-13 NOTE — Anesthesia Procedure Notes (Signed)
Procedure Name: LMA Insertion Date/Time: 04/13/2022 10:59 AM  Performed by: Lavonia Dana, CRNAPre-anesthesia Checklist: Patient identified, Emergency Drugs available, Suction available and Patient being monitored Patient Re-evaluated:Patient Re-evaluated prior to induction Oxygen Delivery Method: Circle system utilized Preoxygenation: Pre-oxygenation with 100% oxygen Induction Type: IV induction Ventilation: Mask ventilation without difficulty LMA: LMA inserted LMA Size: 4.0 Number of attempts: 1 Airway Equipment and Method: Bite block Placement Confirmation: positive ETCO2 Tube secured with: Tape Dental Injury: Teeth and Oropharynx as per pre-operative assessment

## 2022-04-13 NOTE — Transfer of Care (Signed)
Immediate Anesthesia Transfer of Care Note  Patient: [name]  Procedure(s) Performed: RIGHT BREAST SEED LUMPECTOMY (Right: Breast)  Patient Location: PACU  Anesthesia Type:General  Level of Consciousness: awake, alert  and oriented  Airway & Oxygen Therapy: Patient Spontanous Breathing and Patient connected to face mask oxygen  Post-op Assessment: Report given to RN and Post -op Vital signs reviewed and stable  Post vital signs: Reviewed and stable  Last Vitals:  Vitals Value Taken Time  BP    Temp    Pulse 59 04/13/22 1146  Resp    SpO2 97 % 04/13/22 1146  Vitals shown include unvalidated device data.  Last Pain:  Vitals:   04/13/22 0858  TempSrc: Oral  PainSc: 0-No pain      Patients Stated Pain Goal: 3 (61/22/44 9753)  Complications: No notable events documented.

## 2022-04-13 NOTE — Discharge Instructions (Addendum)
Do not take tylenol until 3:30 p.m. [place] Office Phone Number [phone]  BREAST BIOPSY/ PARTIAL MASTECTOMY: POST OP INSTRUCTIONS  Always review your discharge instruction sheet given to you by the facility where your surgery was performed.  IF YOU HAVE DISABILITY OR FAMILY LEAVE FORMS, YOU MUST BRING THEM TO THE OFFICE FOR PROCESSING.  DO NOT GIVE THEM TO YOUR DOCTOR.  A prescription for pain medication may be given to you upon discharge.  Take your pain medication as prescribed, if needed.  If narcotic pain medicine is not needed, then you may take acetaminophen (Tylenol) or ibuprofen (Advil) as needed. Take your usually prescribed medications unless otherwise directed If you need a refill on your pain medication, please contact your pharmacy.  They will contact our office to request authorization.  Prescriptions will not be filled after 5pm or on week-ends. You should eat very light the first 24 hours after surgery, such as soup, crackers, pudding, etc.  Resume your normal diet the day after surgery. Most patients will experience some swelling and bruising in the breast.  Ice packs and a good support bra will help.  Swelling and bruising can take several days to resolve.  It is common to experience some constipation if taking pain medication after surgery.  Increasing fluid intake and taking a stool softener will usually help or prevent this problem from occurring.  A mild laxative (Milk of Magnesia or Miralax) should be taken according to package directions if there are no bowel movements after 48 hours. Unless discharge instructions indicate otherwise, you may remove your bandages 24-48 hours after surgery, and you may shower at that time.  You may have steri-strips (small skin tapes) in place directly over the incision.  These strips should be left on the skin for 7-10 days.  If your surgeon used skin glue on the incision, you may shower in 24 hours.  The glue will flake  off over the next 2-3 weeks.  Any sutures or staples will be removed at the office during your follow-up visit. ACTIVITIES:  You may resume regular daily activities (gradually increasing) beginning the next day.  Wearing a good support bra or sports bra minimizes pain and swelling.  You may have sexual intercourse when it is comfortable. You may drive when you no longer are taking prescription pain medication, you can comfortably wear a seatbelt, and you can safely maneuver your car and apply brakes. RETURN TO WORK:  ______________________________________________________________________________________ Dennis Bast should see your doctor in the office for a follow-up appointment approximately two weeks after your surgery.  Your doctor's nurse will typically make your follow-up appointment when she calls you with your pathology report.  Expect your pathology report 2-3 business days after your surgery.  You may call to check if you do not hear from [place] after three days. OTHER INSTRUCTIONS: _______________________________________________________________________________________________ _____________________________________________________________________________________________________________________________________ _____________________________________________________________________________________________________________________________________ _____________________________________________________________________________________________________________________________________  WHEN TO CALL YOUR DOCTOR: Fever over 101.0 Nausea and/or vomiting. Extreme swelling or bruising. Continued bleeding from incision. Increased pain, redness, or drainage from the incision.  The clinic staff is available to answer your questions during regular business hours.  Please don't hesitate to call and ask to speak to one of the nurses for clinical concerns.  If you have a medical emergency, go to the nearest emergency room or call  911.  A surgeon from [facility] is always on call at the hospital.  For further questions, please visit [email or web address]    Post Anesthesia Home Care Instructions  Activity: Get  plenty of rest for the remainder of the day. A responsible individual must stay with you for 24 hours following the procedure.  For the next 24 hours, DO NOT: -Drive a car -Paediatric nurse -Drink alcoholic beverages -Take any medication unless instructed by your physician -Make any legal decisions or sign important papers.  Meals: Start with liquid foods such as gelatin or soup. Progress to regular foods as tolerated. Avoid greasy, spicy, heavy foods. If nausea and/or vomiting occur, drink only clear liquids until the nausea and/or vomiting subsides. Call your physician if vomiting continues.  Special Instructions/Symptoms: Your throat may feel dry or sore from the anesthesia or the breathing tube placed in your throat during surgery. If this causes discomfort, gargle with warm salt water. The discomfort should disappear within 24 hours.  If you had a scopolamine patch placed behind your ear for the management of post- operative nausea and/or vomiting:  1. The medication in the patch is effective for 72 hours, after which it should be removed.  Wrap patch in a tissue and discard in the trash. Wash hands thoroughly with soap and water. 2. You may remove the patch earlier than 72 hours if you experience unpleasant side effects which may include dry mouth, dizziness or visual disturbances. 3. Avoid touching the patch. Wash your hands with soap and water after contact with the patch.

## 2022-04-13 NOTE — Op Note (Addendum)
Preoperative diagnosis: Right breast papilloma  Postoperative diagnosis: Same  Procedure: Right breast seed localized lumpectomy  Surgeon: Erroll Luna, MD  Assistant Dr. Karlene Lineman MD  I was personally present/performed the key and critical portions of this procedure and immediately available throughout the entire procedure, as documented in my operative note.   Anesthesia: LMA with 0.25% Marcaine with epinephrine  EBL: Minimal  Specimen: Right breast tissue with seed and clip verified by Faxitron  Drains: None  IV fluids: Per anesthesia record  Indications for procedure: The patient is a 76 year old female with a right breast papilloma.  We discussed observation versus surgery and she desired to have it removed.  Risks and benefits of surgery as well as long-term expectations and potential upgrade risk to a more atypical lesion discussed with the patient.The procedure has been discussed with the patient. Alternatives to surgery have been discussed with the patient.  Risks of surgery include bleeding,  Infection,  Seroma formation, death,  and the need for further surgery.   The patient understands and wishes to proceed.   Description of procedure: The patient was met in the holding area and questions were answered.  She was then taken back to the operating room and placed upon upon the OR table.  After induction of general esthesia, the right breast was prepped and draped in a sterile fashion timeout performed.  Proper patient, site and procedure verified.  Neoprobe used to identify the seed underneath the right nipple.  Local anesthetic was infiltrated along the superior border of the nipple areolar complex.  Incision was made.  Dissection was carried down all tissue and the seed and clip were excised with a grossly negative margin.  The Faxitron image revealed the seed and clip be present.  The tissue was oriented with ink and sent to pathology.  Hemostasis was achieved.  The incision was  then closed with 3-0 Vicryl and 4 Monocryl.  Dermabond applied.  All counts found to be correct.  Breast binder placed.  Patient was awoke extubated taken to recovery in satisfactory condition.

## 2022-04-14 ENCOUNTER — Encounter (HOSPITAL_BASED_OUTPATIENT_CLINIC_OR_DEPARTMENT_OTHER): Payer: Self-pay | Admitting: Surgery

## 2022-04-14 NOTE — Telephone Encounter (Signed)
Called and spoke to patient and told her CY will talk to her about her options at office visit. Nothing further needed

## 2022-04-15 ENCOUNTER — Encounter: Payer: Self-pay | Admitting: Surgery

## 2022-04-15 LAB — SURGICAL PATHOLOGY

## 2022-04-18 NOTE — Progress Notes (Unsigned)
HPI F Smoker (1 pk/ week, 15 pkyrs followed for OSA, Bronchiectasis, complicated by  Orthostasis, HTN, CAD/ stent, Allergic Rhinitis, GERD,  NPSG 10/07/18- AHI 65.8/ hr, desaturation to 89%, body weight 209 lbs HST 02/09/21- AHI 50.5/ hr, desaturation to a nadir of 70%- average 87%, body weight 226 lbs PFT 05/04/21- Mild airtrapping but normal flows w/o resp to BD. DLCO is high for volume. HST 12/07/21- AHI 46.7/ hr, desaturation to 64%(mean 88%) body weight 226 lbs ===============================================================    01/18/22- 75 yoF former smoker (1 pk/ week, 15 pkyrs followed for OSA(failed CPAP), Bronchiectasis, complicated by  Orthostasis, HTN, CAD/ stent, Aortic Atherosclerosis, Allergic Rhinitis, GERD,  Son is [name] Kitchen Duley -Ventolin hfa, Singulair,Trelegy 100, Flonase,  CPAP auto 4-10/ [facility] Intolerant of CPAP mask- referred for mask fitting by [facility] DME on 2/17 Download-compliance 0%, AHI 1.6/ hr> turned it back in Body weight today-229 lbs Covid vax-2 Phizer Flu vax-had HST 12/07/21- AHI 46.7/ hr, desaturation to 64%(mean 88%) body weight 226 lbs Recent sleep study reviewed. She is willing to try CPAP again, but does want mask fitting.  Breathing is ok.  CXR 11/05/21- IMPRESSION: No acute appearing airspace opacity.  04/20/22- 48 yoF former smoker (1 pk/ week, 15 pkyrs) followed for OSA, COPD/Bronchiectasis, complicated by  Orthostasis, HTN, CAD/ stent, Aortic Atherosclerosis, Allergic Rhinitis, GERD, Depression,  Son is [name] Kitchen Khachatryan -Ventolin hfa, Singulair,Trelegy 100, Flonase,  CPAP auto 4-10/ [facility] Intolerant of CPAP mask- referred for mask fitting by [facility] DME on 2/17 Download-compliance - Body weight today Covid vax-2 Phizer Flu vax-today senior -----Pt f/u for COPD, having SOB on exertion. Having increased nasal congestion was told in the past she is allergic to ragweed.  Checking on delivery status of CPAP from [facility]. She says problem before was  mask fit and I explained we can help if that is a problem. She is using Ventolin rescue inhaler every night at bedtime for wheeze and sometimes needs it during day. Doesn't remember effect of Trelegy sample. Discussed trial of generic Symbicort maintenance, which should be cheaper. Little productive cough lately.  ROS-see HPI   + = positive Constitutional:    weight loss, night sweats, fevers, chills, fatigue, lassitude. HEENT:    headaches, +difficulty swallowing, tooth/dental problems, sore throat,       sneezing, itching, ear ache, +nasal congestion, post nasal drip, snoring CV:    chest pain, orthopnea, PND, swelling in lower extremities, anasarca,      dizziness, palpitations Resp:   +shortness of breath with exertion or at rest.                productive cough,   non-productive cough, coughing up of blood.              change in color of mucus.  wheezing.   Skin:    rash or lesions. GI:  No-   heartburn, indigestion, abdominal pain, nausea, vomiting, diarrhea,                 change in bowel habits, loss of appetite GU: dysuria, change in color of urine, no urgency or frequency.   flank pain. MS:   +joint pain, stiffness, decreased range of motion, back pain. Neuro-     +dizzy Psych:  change in mood or affect.  depression or anxiety.   memory loss.  OBJ- Physical Exam General- Alert, Oriented, Affect-appropriate, Distress- none acute, + obese Skin- +spots of vitelligo Lymphadenopathy- none Head- atraumatic  Eyes- Gross vision intact, PERRLA, conjunctivae and secretions clear            Ears- Hearing, canals-normal            Nose- Clear, no-Septal dev, mucus, polyps, erosion, perforation             Throat- Mallampati III, mucosa clear , drainage- none, +tonsils, + teeth Neck- flexible , trachea midline, no stridor , thyroid nl, carotid no bruit Chest - symmetrical excursion , unlabored           Heart/CV- RRR , no murmur , no gallop  , no rub, nl s1 s2                            - JVD- none , edema- none, stasis changes- none, varices- none           Lung- +clear, wheeze- none, cough- none , dullness-none, rub- none           Chest wall-  Abd-  Br/ Gen/ Rectal- Not done, not indicated Extrem- cyanosis- none, clubbing, none, atrophy- none, strength- nl Neuro- grossly intact to observation

## 2022-04-20 ENCOUNTER — Other Ambulatory Visit: Payer: Self-pay | Admitting: Internal Medicine

## 2022-04-20 ENCOUNTER — Ambulatory Visit: Payer: Medicare HMO | Admitting: Internal Medicine

## 2022-04-20 ENCOUNTER — Other Ambulatory Visit: Payer: Self-pay | Admitting: Family Medicine

## 2022-04-20 ENCOUNTER — Encounter: Payer: Self-pay | Admitting: Internal Medicine

## 2022-04-20 VITALS — BP 128/74 | HR 63 | Ht 66.0 in | Wt 229.8 lb

## 2022-04-20 DIAGNOSIS — R062 Wheezing: Secondary | ICD-10-CM | POA: Diagnosis not present

## 2022-04-20 DIAGNOSIS — G4733 Obstructive sleep apnea (adult) (pediatric): Secondary | ICD-10-CM

## 2022-04-20 DIAGNOSIS — I119 Hypertensive heart disease without heart failure: Secondary | ICD-10-CM

## 2022-04-20 DIAGNOSIS — Z23 Encounter for immunization: Secondary | ICD-10-CM | POA: Diagnosis not present

## 2022-04-20 DIAGNOSIS — E785 Hyperlipidemia, unspecified: Secondary | ICD-10-CM

## 2022-04-20 DIAGNOSIS — J449 Chronic obstructive pulmonary disease, unspecified: Secondary | ICD-10-CM

## 2022-04-20 MED ORDER — BUDESONIDE-FORMOTEROL FUMARATE 80-4.5 MCG/ACT IN AERO: INHALATION_SPRAY | RESPIRATORY_TRACT | 12 refills | Status: DC

## 2022-04-20 MED ORDER — ALBUTEROL SULFATE HFA 108 (90 BASE) MCG/ACT IN AERS: INHALATION_SPRAY | RESPIRATORY_TRACT | 12 refills | Status: DC

## 2022-04-20 NOTE — Telephone Encounter (Signed)
Rx refill sent to pharmacy. 

## 2022-04-20 NOTE — Patient Instructions (Addendum)
We are checking on status of your replacement CPAP from [facility]- flu vax- senior  Refill scripts sent for  Symbicort 80 maintenance inhaler    inhale 2 puffs, then rinse mouth, twice daily, every day    Leave this one on your bathroom sink so you see it and remember.  Ventolin (albuterol) rescue inhaler   inhale 2 puffs every 6 hours, only if you need extra that day as a"patch". Carry this one with you.  Stop Singulair(montelukast) - see if it makes any difference in your mood or your asthma.

## 2022-04-21 ENCOUNTER — Telehealth: Payer: Self-pay | Admitting: Pharmacist

## 2022-04-21 ENCOUNTER — Encounter: Payer: Self-pay | Admitting: Internal Medicine

## 2022-04-21 NOTE — Chronic Care Management (AMB) (Signed)
Chronic Care Management Pharmacy Assistant   Name: [name]  MRN: [id number] DOB: 16-Oct-1945  Reason for Encounter: Disease State and Medication Review / COPD Assessment and Medication Coordination Call   Conditions to be addressed/monitored: COPD  Recent office visits:  03/24/2022 [name] MD - Patient was seen for Moderate episode of recurrent major depressive disorder and additional concerns. Increased Sertraline to 50 mg daily. Follow up in 6 months.   Recent consult visits:  04/20/2022 Baird [name] MD (pulmonology) - Patient was seen for Wheezing and additional concerns. Started Symbicort 80/4.40mg/act 2 puffs twice daily. Discontinued Trelegy and discontinue Singulair to see if it makes any difference in mood or asthma. Follow up in 6 months.   04/12/2022 [facility]- Patient was seen for Major depressive disorder, recurrent episode, moderate. No medication changes.   Hospital visits:  [facility] due to right breast seed lumpectomy.    [facility] tra[facility] (Ultram) Medication Changes at Hospital Discharge: No medication changes Medications Discontinued at Hospital Discharge: No medications discontinued Medications that remain the same after Hospital Discharge:??  -All other medications will remain the same.    Medications: Outpatient Encounter Medications as of 04/21/2022  Medication Sig   acetaminophen (TYLENOL) 500 MG tablet Take 1,000 mg by mouth as needed for moderate pain or headache.    albuterol (VENTOLIN HFA) 108 (90 Base) MCG/ACT inhaler INHALE ONE OR TWO PUFFS BY MOUTH INTO THE LUNGS EVERY SIX HOURS AS NEEDED FOR WHEEZING OR SHORTNESS OF BREATH   Alirocumab (PRALUENT) 75 MG/ML SOAJ Inject 75 mg into the skin every 14 (fourteen) days.   aspirin 81 MG chewable tablet Chew 81 mg by mouth daily.   budesonide-formoterol (SYMBICORT) 80-4.5 MCG/ACT inhaler Inhale 2  puffs then rinse mouth, twice daily   ezetimibe (ZETIA) 10 MG tablet Take 1 tablet (10 mg total) by mouth daily.   famotidine (PEPCID) 20 MG tablet TAKE ONE TABLET BY MOUTH EVERY MORNING and TAKE ONE TABLET BY MOUTH EVERYDAY AT BEDTIME   fluticasone (FLONASE) 50 MCG/ACT nasal spray PLACE 2 SPRAYS INTO BOTH NOSTRILS AT BEDTIME AS NEEDED FOR ALLERGIES.   gabapentin (NEURONTIN) 300 MG capsule Take 2 capsules (600 mg total) by mouth 2 (two) times daily.   hydrochlorothiazide (HYDRODIURIL) 25 MG tablet TAKE ONE TABLET BY MOUTH ONCE DAILY   losartan (COZAAR) 100 MG tablet TAKE ONE TABLET BY MOUTH EVERY MORNING   metoprolol succinate (TOPROL-XL) 100 MG 24 hr tablet TAKE ONE TABLET BY MOUTH AT NOON   montelukast (SINGULAIR) 10 MG tablet TAKE ONE TABLET BY MOUTH EVERYDAY AT BEDTIME   MYRBETRIQ 50 MG TB24 tablet Take 50 mg by mouth daily.   nitroGLYCERIN (NITROSTAT) 0.4 MG SL tablet Place 1 tablet (0.4 mg total) under the tongue every 5 (five) minutes as needed for chest pain.   ondansetron (ZOFRAN-ODT) 4 MG disintegrating tablet '4mg'$  ODT q4 hours prn nausea/vomit   polyethylene glycol powder (GLYCOLAX/MIRALAX) powder Take 17 g by mouth daily.   rosuvastatin (CRESTOR) 20 MG tablet Take 1 tablet (20 mg total) by mouth at bedtime.   sertraline (ZOLOFT) 50 MG tablet Take 50 mg by mouth daily.   traMADol (ULTRAM) 50 MG tablet Take 1 tablet (50 mg total) by mouth every 6 (six) hours as needed.   No facility-administered encounter medications on file as of 04/21/2022.   Reviewed chart for medication changes ahead of medication coordination call.  BP Readings from Last  3 Encounters:  04/20/22 128/74  04/13/22 (!) 159/79  03/24/22 110/70    Lab Results  Component Value Date   HGBA1C 6.3 03/24/2022     Patient obtains medications through Adherence Packaging  30 Days    Last adherence delivery included:  Praluent Inj 75 mg - inject 75 mg every 14 days Gabapentin 300 mg - take 2 tablets at lunch and  bedtime Ezetimibe 10 mg  - take one tablet at breakfast Famotidine 20 mg - take one tablet at breakfast and bedtime Rosuvastatin 20 mg - take one tablet at bedtime Metoprolol Suc 100 mg - take one tablet at lunch Losartan 100 mg - take one tablet at breakfast HCTZ 25 mg - take one tablet at breakfast Montelukast 10 mg - take one tablet at bedtime Myrbetriq 50 mg - take one tablet at breakfast Sertraline 50 mg - take one tablet at lunch (New dose of 50 mg tablet will start with new packs on 04/02/22 Patient is aware and voices clear understanding)   Patient declined the following medications:     Patient is due for next adherence delivery on: 05/03/2022   Called patient and reviewed medications and coordinated delivery.   This delivery to include: Praluent Inj 75 mg - inject 75 mg every 14 days Gabapentin 300 mg - take 2 tablets at lunch and bedtime Ezetimibe 10 mg  - take one tablet at breakfast Famotidine 20 mg - take one tablet at breakfast and bedtime Rosuvastatin 20 mg - take one tablet at bedtime Metoprolol Suc 100 mg - take one tablet at lunch Losartan 100 mg - take one tablet at breakfast HCTZ 25 mg - take one tablet at breakfast Myrbetriq 50 mg - take one tablet at breakfast Sertraline 50 mg - take one tablet at lunch   Patient will need a short fill: No short fills needed   Coordinated acute fill: No acute fills needed  Patient declined the following medications: Montelukast 10 mg - take one tablet at bedtime (on hold through Dr. Annamaria Boots)  Confirmed delivery date of 05/03/2022, advised patient that pharmacy will contact them the morning of delivery.  Current COPD regimen:               Albuterol HFA as needed              Symbicort 80/4.5 mcg/act 2 puffs twice daily (filled through [facility])  Any recent hospitalizations or ED visits since last visit with CPP? Admitted to [facility] on 04/13/2022 (4 hours) due to right breast seed lumpectomy.    Reports  denies COPD symptoms or flare ups  What recent interventions/DTPs have been made by any provider to improve breathing since last visit: On 04/20/2022 Dr. Annamaria Boots started Symbicort 80/4.22mg/act 2 puffs twice daily. Discontinued Trelegy and discontinue Singulair to see if it makes any difference in mood or asthma.  Have you had exacerbation/flare-up since last visit? Patient denies  What do you do when you are short of breath?  Patient uses her rescue inhaler  Respiratory Devices/Equipment Do you have a nebulizer? Patient denies Do you use a Peak Flow Meter? Patient denies Do you use a maintenance inhaler? Changed from Trelegy to Symbicort on 04/20/2022 How often do you forget to use your daily inhaler? Patient states she does not miss any doses.  Do you use a rescue inhaler? Patient does use Albuterol inhaler as needed How often do you use your rescue inhaler?  Patient states on average once daily  Do you use a spacer with your inhaler? Patient denies  Adherence Review: Does the patient have >5 day gap between last estimated fill date for maintenance inhaler medications? No  Care Gaps: AWV - scheduled 08/30/2022 Last BP - 128/74 on 04/20/2022 Last A1C - 6.3 on 03/24/2022 Covid booster - overdue Urine ACR - overdue Tdap - postponed Shingrix - postponed  Star Rating Drugs: Losartan 100 mg - last filled 03/30/2022 30 DS at [facility] Rosuvastatin 20 mg - last filled 03/30/2022 30 DS at [facility] [phone]

## 2022-04-21 NOTE — Assessment & Plan Note (Signed)
I think she would benefit from a maintenance controller. Plan- reordering albuterol hfa and adding Symbicort 80. Flu vaccine.

## 2022-04-21 NOTE — Assessment & Plan Note (Signed)
Willing to try CPAP again with attention to mask comfort. We are checking with [facility] on delivery of machine.

## 2022-04-22 ENCOUNTER — Telehealth: Payer: Self-pay | Admitting: Internal Medicine

## 2022-04-22 DIAGNOSIS — G4733 Obstructive sleep apnea (adult) (pediatric): Secondary | ICD-10-CM

## 2022-04-22 NOTE — Telephone Encounter (Signed)
Patient called regarding her cpap.  She stated she has not gotten an update yet.  Also patient said that the medication that was prescribed cost $94, which is too expensive for her and would like to know if there is an alternative medication.  Please call to discuss at [phone]

## 2022-04-23 NOTE — Telephone Encounter (Signed)
Called and spoke with pt to see if she ever received a cpap machine from [facility] and pt said that she had one prior but turned it back in.  Saw at last OV 9/19 that mentioned that pt was willing to try cpap again. Due to this, we are needing to place a new order for pt to receive a new cpap machine.  Do not see that a new order was placed at last OV with CY. See where it mentioned 09/07/21 that cpap settings were auto 4-10. Dr. Annamaria Boots, please advise if these are still the settings that we need to use for pt when we place a new order for her to receive another cpap from [facility].

## 2022-04-23 NOTE — Telephone Encounter (Signed)
Order has been placed for pt to receive cpap again from [facility]. Nothing further needed.

## 2022-04-23 NOTE — Telephone Encounter (Signed)
Yes ok to order  

## 2022-04-26 ENCOUNTER — Ambulatory Visit (INDEPENDENT_AMBULATORY_CARE_PROVIDER_SITE_OTHER): Payer: Medicare HMO | Admitting: Psychologist

## 2022-04-26 DIAGNOSIS — F331 Major depressive disorder, recurrent, moderate: Secondary | ICD-10-CM | POA: Diagnosis not present

## 2022-04-26 DIAGNOSIS — R69 Illness, unspecified: Secondary | ICD-10-CM | POA: Diagnosis not present

## 2022-04-26 NOTE — Progress Notes (Signed)
                Amaziah Raisanen, PsyD 

## 2022-04-26 NOTE — Progress Notes (Signed)
[name]  Patient ID: [name], MRN: [id number],    Date: 04/26/2022  Time Spent: 01:05 pm to 01:43 pm; total time: 38 minutes   [name]. The patient consented to in-Blackwell therapy and was in the clinician's office. Limits of confidentiality were discussed with the patient.   Treatment Type: Individual Therapy  Reported Symptoms: Depression  Mental Status Exam: Appearance:  Well Groomed     Behavior: Appropriate  Motor: Normal  Speech/Language:  Clear and Coherent  Affect: Appropriate  Mood: normal  Thought process: normal  Thought content:   WNL  Sensory/Perceptual disturbances:   WNL  Orientation: oriented to Blackwell, place, and time/date  Attention: Good  Concentration: Good  Memory: WNL  Fund of knowledge:  Good  Insight:   Fair  Judgment:  Good  Impulse Control: Good   Risk Assessment: Danger to Self:  No Self-injurious Behavior: No Danger to Others: No Duty to Warn:no Physical Aggression / Violence:No  Access to Firearms a concern: No  Gang Involvement:No   Subjective: Beginning the session, patient described herself as doing well indicating that not much has changed since the intake. After reviewing the treatment plan, patient stated that she still has moments of being tearful and feels "empty". Continuing to talk, she reflected on activities she has found enjoyable previously. She reflected on the idea of participating in some of those activities again. She was agreeable to homework and following up. She denied suicidal and homicidal ideation.    Interventions:  Worked on developing a therapeutic relationship with the patient using active listening and reflective statements. Provided emotional support using empathy and validation. Reviewed the treatment plan with the patient. Reviewed events since the intake. Normalized and validated expressed thoughts and emotions. Normalized the idea  of crying. Identified goals for the session. Used socratic questions to assist the patient gain insight. Used behavioral activation to assist the patient. Provided psychoeducation about the intent of behavioral activation and how sometimes mood change comes after taking action. Explored what patient used to do for fun. Processed how the patient could do those activities again. Assisted in problem solving. Provided empathic statements regarding church situation. Assigned homework Assessed for suicidal and homicidal ideation.   Homework: Participate in baking and attending church; contact group about cooking food for others.   Next Session: Review homework and emotional support.   Diagnosis: F33.1 major depressive affective disorder, recurrent, moderate  Plan:   Goals Alleviate depressive symptoms Recognize, accept, and cope with depressive feelings Develop healthy thinking patterns Develop healthy interpersonal relationships  Objectives target date for all objectives is 04/13/2023 Cooperate with a medication evaluation by a physician Verbalize an accurate understanding of depression Verbalize an understanding of the treatment Identify and replace thoughts that support depression Learn and implement behavioral strategies Verbalize an understanding and resolution of current interpersonal problems Learn and implement problem solving and decision making skills Learn and implement conflict resolution skills to resolve interpersonal problems Verbalize an understanding of healthy and unhealthy emotions verbalize insight into how past relationships may be influence current experiences with depression Use mindfulness and acceptance strategies and increase value based behavior  Increase hopeful statements about the future.  Interventions Consistent with treatment model, discuss how change in cognitive, behavioral, and interpersonal can help client alleviate depression CBT Behavioral activation  help the client explore the relationship, nature of the dispute,  Help the client develop new interpersonal skills and relationships Conduct Problem so living therapy  Teach conflict resolution skills Use a process-experiential approach Conduct TLDP Conduct ACT Evaluate need for psychotropic medication Monitor adherence to medication   The patient and clinician reviewed the treatment plan on 04/26/2022. The patient approved of the treatment plan.   [name], PsyD

## 2022-04-27 ENCOUNTER — Telehealth: Payer: Self-pay | Admitting: Internal Medicine

## 2022-04-30 ENCOUNTER — Ambulatory Visit (HOSPITAL_COMMUNITY)
Admission: EM | Admit: 2022-04-30 | Discharge: 2022-04-30 | Disposition: A | Discharge status: Home / Self Care | Payer: Medicare HMO | Attending: Family Medicine | Admitting: Family Medicine

## 2022-04-30 ENCOUNTER — Encounter (HOSPITAL_COMMUNITY): Payer: Self-pay

## 2022-04-30 ENCOUNTER — Telehealth (HOSPITAL_COMMUNITY): Payer: Self-pay | Admitting: Family Medicine

## 2022-04-30 DIAGNOSIS — J069 Acute upper respiratory infection, unspecified: Secondary | ICD-10-CM

## 2022-04-30 DIAGNOSIS — U071 COVID-19: Secondary | ICD-10-CM | POA: Diagnosis not present

## 2022-04-30 LAB — RESP PANEL BY RT-PCR (FLU A&B, COVID) ARPGX2
Influenza A by PCR: NEGATIVE
Influenza B by PCR: NEGATIVE
SARS Coronavirus 2 by RT PCR: POSITIVE — AB

## 2022-04-30 MED ORDER — NIRMATRELVIR/RITONAVIR (PAXLOVID) TABLET (RENAL DOSING): ORAL_TABLET | ORAL | 0 refills | Status: DC

## 2022-04-30 NOTE — ED Triage Notes (Signed)
Pt is here for head congestion , cough , body aches ,loss of appetite x 1 day

## 2022-04-30 NOTE — ED Provider Notes (Signed)
[place]    CSN: [id number] Arrival date & time: 04/30/22  1614      History   Chief Complaint Chief Complaint  Patient presents with   Cough    HPI [name] is a 76 y.o. female.    Cough  Here for cold symptoms that began yesterday evening.  She has some nasal congestion and headache and some cough.  Earlier this morning she felt cold and she has been aching.  She has not measured a fever.  Her daughter is sick with similar symptoms and she has had a temperature of 103.  Past medical history significant for chronic kidney disease with her last GFR being 31.  She has wheezed a little bit.  She has a history of COPD and has an inhaler to use  Past Medical History:  Diagnosis Date   Allergy    Anxiety    C. difficile diarrhea    history of   CAD (coronary artery disease), native coronary artery    a. 05/2017 showed 100% mLCx tx with DES, otherwise 20% mRCA, 50% ramus, 30% dLAD, 20% prox-mid LAD, 20% mLAD treated medically.    Cancer [facility])    Right kidney CA removed.    CKD (chronic kidney disease) stage 3, GFR 30-59 ml/min (HCC) 10/11/2016   s/p R nephrectomy   Colon polyps 2008   HYPERPLASTIC   COPD (chronic obstructive pulmonary disease) ([place])    former smoker   Gastritis    GERD (gastroesophageal reflux disease)    History of echocardiogram    Echo 3/18:  Moderate LVH, EF 49-67, grade 1 diastolic dysfunction, calcified aortic valve, mild MR, moderate LAE   History of nuclear stress test    Myoview 3/18: Mod size and intensity fixed septal defect, may be artifact.Opposite mod size and intensity lat defect, which is reversible and could represent ischemia or possibly artifact (SDS 4). LVEF 71% with normal wall motion. Intermediate risk study. >> images reviewed with Dr. Dorris Carnes - no sig ischemia; med rx    Hx of cardiovascular stress test    Lexiscan Myoview 6/16:  EF 70%, no scar or ischemia; Low Risk   Hyperlipidemia    Hypertension     Neuropathy    Orthostatic hypotension 05/28/2017   OSA (obstructive sleep apnea) 01/16/2021   Osteoarthritis    Pre-diabetes    no meds   Rheumatoid arthritis (HCC)    RA (Dr. Ouida Sills) Bilateral hands   S/P angioplasty with stent 05/27/17 to LCX with DES  05/28/2017   Sleep apnea 2023   does not use CPAP   Thyroid disease     Patient Active Problem List   Diagnosis Date Noted   Moderate episode of recurrent major depressive disorder ([place]) 03/24/2022   PAD (peripheral artery disease) ([place]) 11/28/2021   OSA (obstructive sleep apnea) 01/16/2021   COPD mixed type ([place]) 01/16/2021   Fatigue 11/03/2020   B12 deficiency 11/03/2020   Chest pain 08/30/2020   Malignant neoplasm of kidney excluding renal pelvis, unspecified laterality ([place]) 09/26/2019   Vitamin D deficiency, unspecified 09/26/2019   Anxiety disorder, unspecified 03/26/2019   Tobacco use disorder 09/29/2018   Cervical myelopathy ([place]) 05/29/2018   ANA positive 12/08/2017   Cervicalgia 09/16/2017   Carpal tunnel syndrome of left wrist 07/21/2017   Chondrocalcinosis 07/21/2017   Neuropathy 07/21/2017   Orthostatic hypotension 05/28/2017   S/P angioplasty with stent 05/27/17 to LCX with DES  05/28/2017   CAD (coronary artery disease)  Polyneuropathy 05/25/2017   Chest pain with moderate risk for cardiac etiology 05/25/2017   Class 1 obesity with serious comorbidity and body mass index (BMI) of 34.0 to 34.9 in adult 12/06/2016   CKD (chronic kidney disease) stage 3, GFR 30-59 ml/min (HCC) 10/11/2016   Hot flashes, menopausal 10/01/2016   Chest pain with moderate risk of acute coronary syndrome 10/25/2014   Degenerative spondylolisthesis 10/15/2014   Spinal stenosis, lumbar region, with neurogenic claudication 02/12/2013   DM (diabetes mellitus), type 2 with renal complications ([place]) 98/92/1194   History of Rtr nephrectomy 2010, secondary to renal cell cancer 01/14/2009   PERSONAL HX COLONIC POLYPS 11/14/2008    Hyperlipidemia associated with type 2 diabetes mellitus ([place]) 11/16/2007   ESOPHAGEAL STRICTURE 10/12/2007   Hypertension with heart disease 01/19/2007   Allergic rhinitis 01/19/2007   GERD 01/19/2007    Past Surgical History:  Procedure Laterality Date   ABDOMINAL HYSTERECTOMY  1978   ANTERIOR CERVICAL DECOMP/DISCECTOMY FUSION N/A 05/29/2018   Procedure: ANTERIOR CERVICAL DECOMPRESSION FUSION - CERVICAL FIVE-CERVICAL SIX - CERVICAL SIX-CERVICAL SEVEN;  Surgeon: Earnie Larsson, MD;  Location: [facility];  Service: Neurosurgery;  Laterality: N/A;   BACK SURGERY     x 2   BREAST LUMPECTOMY WITH RADIOACTIVE SEED LOCALIZATION Right 04/13/2022   Procedure: RIGHT BREAST SEED LUMPECTOMY;  Surgeon: Erroll Luna, MD;  Location: [facility];  Service: General;  Laterality: Right;   CARPAL TUNNEL RELEASE Left    COLONOSCOPY W/ BIOPSIES AND POLYPECTOMY     Hx: of   CORONARY STENT INTERVENTION N/A 05/27/2017   Procedure: CORONARY STENT INTERVENTION;  Surgeon: Burnell Blanks, MD;  Location: [place] CV LAB;  Service: Cardiovascular;  Laterality: N/A;   ESOPHAGOGASTRODUODENOSCOPY     HNP     LEFT HEART CATH AND CORONARY ANGIOGRAPHY N/A 05/27/2017   Procedure: LEFT HEART CATH AND CORONARY ANGIOGRAPHY;  Surgeon: Burnell Blanks, MD;  Location: [place] CV LAB;  Service: Cardiovascular;  Laterality: N/A;   LUMBAR LAMINECTOMY/DECOMPRESSION MICRODISCECTOMY Left 02/12/2013   Procedure: LUMBAR TWO THREE, LUMBAR THREE FOUR, LUMBAR FOUR FIVE  LAMINECTOMY/DECOMPRESSION MICRODISCECTOMY 3 LEVELS;  Surgeon: Charlie Pitter, MD;  Location: [place] [facility];  Service: Neurosurgery;  Laterality: Left;   NEPHRECTOMY Right 2010   10.rcc cancer   TOTAL KNEE ARTHROPLASTY Right    Redo    OB History   No obstetric history on file.      Home Medications    Prior to Admission medications   Medication Sig Start Date End Date Taking? Authorizing Provider  acetaminophen (TYLENOL) 500 MG  tablet Take 1,000 mg by mouth as needed for moderate pain or headache.     [provider]  albuterol (VENTOLIN HFA) 108 (90 Base) MCG/ACT inhaler INHALE ONE OR TWO PUFFS BY MOUTH INTO THE LUNGS EVERY SIX HOURS AS NEEDED FOR WHEEZING OR SHORTNESS OF BREATH 04/20/22   Young, Clinton D, MD  Alirocumab (PRALUENT) 75 MG/ML SOAJ Inject 75 mg into the skin every 14 (fourteen) days. 09/21/21   Fay Records, MD  aspirin 81 MG chewable tablet Chew 81 mg by mouth daily.    [provider]  budesonide-formoterol (SYMBICORT) 80-4.5 MCG/ACT inhaler Inhale 2 puffs then rinse mouth, twice daily 04/20/22   Baird [name] D, MD  ezetimibe (ZETIA) 10 MG tablet Take 1 tablet (10 mg total) by mouth daily. 05/26/21   Fay Records, MD  famotidine (PEPCID) 20 MG tablet TAKE ONE TABLET BY MOUTH EVERY MORNING and TAKE ONE  TABLET BY MOUTH EVERYDAY AT BEDTIME 09/23/21   [name], [name], MD  fluticasone (FLONASE) 50 MCG/ACT nasal spray PLACE 2 SPRAYS INTO BOTH NOSTRILS AT BEDTIME AS NEEDED FOR ALLERGIES. 11/13/21   [name], [name], MD  gabapentin (NEURONTIN) 300 MG capsule Take 2 capsules (600 mg total) by mouth 2 (two) times daily. 05/13/21 08/06/22  Penumalli, Earlean Polka, MD  hydrochlorothiazide (HYDRODIURIL) 25 MG tablet TAKE ONE TABLET BY MOUTH ONCE DAILY 01/20/22   [name], [name], MD  losartan (COZAAR) 100 MG tablet TAKE ONE TABLET BY MOUTH EVERY MORNING 01/20/22   [name], [name], MD  metoprolol succinate (TOPROL-XL) 100 MG 24 hr tablet TAKE ONE TABLET BY MOUTH AT NOON 04/20/22   [name], [name], MD  montelukast (SINGULAIR) 10 MG tablet TAKE ONE TABLET BY MOUTH EVERYDAY AT BEDTIME 03/22/22   [name], [name], MD  MYRBETRIQ 50 MG TB24 tablet Take 50 mg by mouth daily. 10/29/21   [provider]  nitroGLYCERIN (NITROSTAT) 0.4 MG SL tablet Place 1 tablet (0.4 mg total) under the tongue every 5 (five) minutes as needed for chest pain. 05/26/21   Fay Records, MD  ondansetron (ZOFRAN-ODT) 4 MG disintegrating  tablet '4mg'$  ODT q4 hours prn nausea/vomit 08/01/21   Deno Etienne, DO  polyethylene glycol powder (GLYCOLAX/MIRALAX) powder Take 17 g by mouth daily. 05/03/17   Levin Erp, PA  rosuvastatin (CRESTOR) 20 MG tablet Take 1 tablet (20 mg total) by mouth at bedtime. 04/20/22   Fay Records, MD  sertraline (ZOLOFT) 50 MG tablet Take 50 mg by mouth daily.    [provider]  traMADol (ULTRAM) 50 MG tablet Take 1 tablet (50 mg total) by mouth every 6 (six) hours as needed. 04/13/22 04/13/23  Erroll Luna, MD    Family History Family History  Problem Relation Age of Onset   Rheum arthritis Mother    Stroke Mother    Prostate cancer Father    Heart disease Father    Diabetes Brother    Dementia Brother    Parkinsonism Brother    Kidney disease Son    Diabetes Brother    Cancer Brother    Dementia Brother    Colon cancer Neg Hx    Esophageal cancer Neg Hx    Pancreatic cancer Neg Hx    Liver disease Neg Hx    Rectal cancer Neg Hx    Stomach cancer Neg Hx     Social History Social History   Tobacco Use   Smoking status: Former    Packs/day: 0.20    Years: 15.00    Total pack years: 3.00    Types: Cigarettes    Start date: 46    Quit date: 09/07/2021    Years since quitting: 0.6   Smokeless tobacco: Never   Tobacco comments:    pt has been an on and off smoker since age 76  Vaping Use   Vaping Use: Never used  Substance Use Topics   Alcohol use: No    Alcohol/week: 0.0 standard drinks of alcohol   Drug use: No     Allergies   Oxycodone-acetaminophen, Percocet [oxycodone-acetaminophen], Pravastatin sodium, Hydrocodone, and Aspirin   Review of Systems Review of Systems  Respiratory:  Positive for cough.      Physical Exam Triage Vital Signs ED Triage Vitals  Enc Vitals Group     BP 04/30/22 1644 106/69     Pulse Rate 04/30/22 1644 70     Resp 04/30/22 1644  12     Temp 04/30/22 1644 98 F (36.7 C)     Temp Source 04/30/22 1644 Oral     SpO2  04/30/22 1644 96 %     Weight 04/30/22 1642 220 lb (99.8 kg)     Height --      Head Circumference --      Peak Flow --      Pain Score 04/30/22 1641 7     Pain Loc --      Pain Edu? --      Excl. in [place]? --    No data found.  Updated Vital Signs BP 106/69 (BP Location: Left Arm)   Pulse 70   Temp 98 F (36.7 C) (Oral)   Resp 12   Wt 99.8 kg   SpO2 96%   BMI 35.51 kg/m   Visual Acuity Right Eye Distance:   Left Eye Distance:   Bilateral Distance:    Right Eye Near:   Left Eye Near:    Bilateral Near:     Physical Exam Vitals reviewed.  Constitutional:      General: She is not in acute distress.    Appearance: She is not toxic-appearing.  HENT:     Right Ear: Tympanic membrane and ear canal normal.     Left Ear: Tympanic membrane and ear canal normal.     Nose: Nose normal.     Mouth/Throat:     Mouth: Mucous membranes are moist.     Pharynx: No oropharyngeal exudate or posterior oropharyngeal erythema.  Eyes:     Extraocular Movements: Extraocular movements intact.     Conjunctiva/sclera: Conjunctivae normal.     Pupils: Pupils are equal, round, and reactive to light.  Cardiovascular:     Rate and Rhythm: Normal rate and regular rhythm.     Heart sounds: No murmur heard. Pulmonary:     Effort: Pulmonary effort is normal. No respiratory distress.     Breath sounds: No stridor. No wheezing, rhonchi or rales.  Musculoskeletal:     Cervical back: Neck supple.  Lymphadenopathy:     Cervical: No cervical adenopathy.  Skin:    Capillary Refill: Capillary refill takes less than 2 seconds.     Coloration: Skin is not jaundiced or pale.  Neurological:     General: No focal deficit present.     Mental Status: She is alert and oriented to person, place, and time.  Psychiatric:        Behavior: Behavior normal.      UC Treatments / Results  Labs (all labs ordered are listed, but only abnormal results are displayed) Labs Reviewed  RESP PANEL BY RT-PCR (FLU  A&B, COVID) [id number]    EKG   Radiology No results found.  Procedures Procedures (including critical care time)  Medications Ordered in UC Medications - No data to display  Initial Impression / Assessment and Plan / UC Course  I have reviewed the triage vital signs and the nursing notes.  Pertinent labs & imaging results that were available during my care of the patient were reviewed by me and considered in my medical decision making (see chart for details).        We will swab for COVID and flu, since she might of had some fever this morning.  If she is positive for flu she should have a prescription for Tamiflu.  If she is positive for COVID she should have a renal dosing of Paxlovid sent in.  I discussed  with her to make sure she is drinking enough fluids. Final Clinical Impressions(s) / UC Diagnoses   Final diagnoses:  Viral URI with cough     Discharge Instructions       You have been swabbed for COVID and flu, and the test will result in the next 24 hours. Our staff will call you if positive. If the COVID test is positive, you should quarantine for 5 days from the start of your symptoms   Take Tylenol as needed for aches or fever  Make sure you get enough fluids and    ED Prescriptions   None    PDMP not reviewed this encounter.   Barrett Henle, MD 04/30/22 [phone]

## 2022-04-30 NOTE — Discharge Instructions (Addendum)
  You have been swabbed for COVID and flu, and the test will result in the next 24 hours. Our staff will call you if positive. If the COVID test is positive, you should quarantine for 5 days from the start of your symptoms   Take Tylenol as needed for aches or fever  Make sure you get enough fluids and

## 2022-04-30 NOTE — Telephone Encounter (Signed)
Received positive COVID results.  I gave her daughter the results after verifying identity.  Her last EGFR was 31, so renal dosing of Paxlovid is sent in.  No interactions were flagged up when the prescription was sent.

## 2022-04-30 NOTE — Telephone Encounter (Signed)
Note that this call was from [place], [place] and LMTCB for [name]- want to be sure that this is correct bc the pt just had HST less than 6 mo ago  Why would she need another?? Will await her call back.

## 2022-05-07 ENCOUNTER — Telehealth: Payer: Self-pay

## 2022-05-07 NOTE — Telephone Encounter (Signed)
--  Caller reports having COVID x5 days. reports headache, fatigue, chills lingering  05/06/2022 4:44:42 PM [place], RN, [facility] Advice Given Per Guideline HOME CARE: * You should be able to treat this at home. CALL BACK IF: * Fever over 103 F (39.4 C) * Chest pain or difficulty breathing occurs * You become worse CARE ADVICE given per COVID-19 - DIAGNOSED OR SUSPECTED (Adult) guideline

## 2022-05-11 ENCOUNTER — Encounter (HOSPITAL_COMMUNITY): Payer: Self-pay

## 2022-05-12 ENCOUNTER — Encounter: Payer: Self-pay | Admitting: Family Medicine

## 2022-05-12 ENCOUNTER — Telehealth (INDEPENDENT_AMBULATORY_CARE_PROVIDER_SITE_OTHER): Payer: Medicare HMO | Admitting: Family Medicine

## 2022-05-12 VITALS — Temp 98.0°F | Ht 66.0 in

## 2022-05-12 DIAGNOSIS — R0981 Nasal congestion: Secondary | ICD-10-CM | POA: Diagnosis not present

## 2022-05-12 DIAGNOSIS — J449 Chronic obstructive pulmonary disease, unspecified: Secondary | ICD-10-CM

## 2022-05-12 MED ORDER — PREDNISONE 20 MG PO TABS: 40.0000 mg | ORAL_TABLET | Freq: Every day | ORAL | 0 refills | Status: AC

## 2022-05-12 NOTE — Progress Notes (Signed)
Virtual Visit via Telephone Note I connected with [name] on 05/12/2022 at 10:00 AM EDT by telephone and verified that I am speaking with the correct person using two identifiers.   I discussed the limitations, risks, security and privacy concerns of performing an evaluation and management service by telephone and the availability of in person appointments. I also discussed with the patient that there may be a patient responsible charge related to this service. The patient expressed understanding and agreed to proceed.  Location patient: home Location provider: work office Participants present for the call: patient, provider Patient did not have a visit in the prior 7 days to address this/these issue(s).  Chief Complaint  Patient presents with   Follow-up    Seen at urgent care on 9/29 and dx with covid. Still having symptoms of head congestion, sneezing, and no appetite.    History of Present Illness: NA [name] is a 76 y.o.female with hx of DM 2, hypertension, CAD, PAD, seasonal allergies, COPD, and anxiety complaining of persistent fatigue, rhinorrhea, nasal congestion, sinus pressure, and productive cough. She was diagnosed with COVID-19 infection on 04/30/2022, she completed antiviral medication. She is no longer febrile. Negative for hemoptysis. "Little" wheezing and dyspnea, exacerbated with exertion.  Negative for CP, palpitation, diaphoresis. She is on albuterol inhaler, using it twice daily. She is not on Symbicort, she could not afford it. Allergy rhinitis on Flonase nasal spray.  She has taking Tylenol. Negative for sore throat, abdominal pain, vomiting, changes in bowel habits, urinary symptoms, or skin rash. She has had some nausea.  Her son and daughter also diagnosed with COVID-19 infection.  Her son has several comorbidities and was admitted yesterday to the ICU.  Depression: She has established with psychiatrist, she feels like problem is well controlled.  She has  a follow-up next week.  Observations/Objective: Patient sounds cheerful and well on the phone. I do not appreciate any SOB. Speech and thought processing are grossly intact. Patient reported vitals:  Assessment and Plan:  1. Nasal sinus congestion Status post COVID-19 infection, completed treatment with Paxlovid, most symptoms have resolved. Explained that it is not uncommon to have nasal congestion as well as cough lasting a few days and even weeks after acute symptoms have resolved. I do not think antibiotic is needed at this time. Recommend nasal saline irrigations as needed. Prednisone will also help. Continue Flonase nasal spray daily as needed. Monitor for new symptoms.  - predniSONE (DELTASONE) 20 MG tablet; Take 2 tablets (40 mg total) by mouth daily with breakfast for 5 days.  Dispense: 10 tablet; Refill: 0  2. COPD mixed type ([place]) Reporting mild wheezing and dyspnea. She cannot afford Symbicort. During the visit I did not appreciate respiratory distress, she was clearly instructed about warning signs. Albuterol inh 2 puff every 6 hours for a week then as needed for wheezing or shortness of breath.  Plain Mucinex recommended. Prednisone 40 mg daily with breakfast for 3-5 days. We discussed side effects of medication.  - predniSONE (DELTASONE) 20 MG tablet; Take 2 tablets (40 mg total) by mouth daily with breakfast for 5 days.  Dispense: 10 tablet; Refill: 0  Follow Up Instructions:  Return if symptoms worsen or fail to improve.  I did not refer this patient for an OV in the next 24 hours for this/these issue(s).  I discussed the assessment and treatment plan with the patient. The patient was provided an opportunity to ask questions and all were answered. The patient  agreed with the plan and demonstrated an understanding of the instructions.   The patient was advised to call back or seek an in-person evaluation if the symptoms worsen or if the condition fails to  improve as anticipated.  I provided  11 minutes of non-face-to-face time during this encounter. Brentt Fread G. [name], MD  [facility]. [place] office.

## 2022-05-18 ENCOUNTER — Telehealth: Payer: Self-pay | Admitting: Internal Medicine

## 2022-05-19 ENCOUNTER — Ambulatory Visit: Payer: Medicare HMO | Admitting: Psychologist

## 2022-05-19 ENCOUNTER — Telehealth: Payer: Self-pay | Admitting: Pharmacist

## 2022-05-19 DIAGNOSIS — F331 Major depressive disorder, recurrent, moderate: Secondary | ICD-10-CM

## 2022-05-19 DIAGNOSIS — R69 Illness, unspecified: Secondary | ICD-10-CM | POA: Diagnosis not present

## 2022-05-19 NOTE — Chronic Care Management (AMB) (Unsigned)
    Chronic Care Management Pharmacy Assistant   Name: [name]  MRN: [id number] DOB: 29-Apr-1946 *** APPOINTMENT REMINDER   Called [name], No answer, left message of appointment on *** at *** via {telephone/office:[id number]} visit with Orlando Penner, Pharm D. Notified to have all medications, supplements, blood pressure and/or blood sugar logs available during appointment and to return call if need to reschedule.  OR: [name] was reminded to have all medications, supplements and any blood glucose and blood pressure readings available for review with Orlando Penner, Pharm. D, at her {telephone/office:25179} visit on *** at ***.  Care Gaps:   Star Rating Drug:   Any gaps in medications fill history?  [place] Pharmacist Assistant [phone]

## 2022-05-19 NOTE — Progress Notes (Signed)
[facility] Counselor/Therapist Progress Note  Patient ID: [name], MRN: [id number],    Date: 05/19/2022  Time Spent: 01:07 pm to 01:45 pm; total time: 38 minutes   This session was held via in person. The patient consented to in-person therapy and was in the clinician's office. Limits of confidentiality were discussed with the patient.   Treatment Type: Individual Therapy  Reported Symptoms: Depression  Mental Status Exam: Appearance:  Well Groomed     Behavior: Appropriate  Motor: Normal  Speech/Language:  Clear and Coherent  Affect: Appropriate  Mood: normal  Thought process: normal  Thought content:   WNL  Sensory/Perceptual disturbances:   WNL  Orientation: oriented to person, place, and time/date  Attention: Good  Concentration: Good  Memory: WNL  Fund of knowledge:  Good  Insight:   Fair  Judgment:  Good  Impulse Control: Good   Risk Assessment: Danger to Self:  No Self-injurious Behavior: No Danger to Others: No Duty to Warn:no Physical Aggression / Violence:No  Access to Firearms a concern: No  Gang Involvement:No   Subjective: Beginning the session, patient described herself as having experienced COVID-19, which has limited her ability to do anything since the last appointment. Patient acknowledged that being active contributes to her feeling better. She explored ways to be active. She was agreeable to homework and following up. She denied suicidal and homicidal ideation.    Interventions:  Worked on developing a therapeutic relationship with the patient using active listening and reflective statements. Provided emotional support using empathy and validation. Reviewed the treatment plan with the patient. Used summary and reflective statements. Normalized and validated expressed thoughts and emotions. Processed how patient is doing following experiencing COVID-19. Used socratic questions to assist the patient. Used behavioral activation to  identify activities patient can participate in. Assigned homework Assessed for suicidal and homicidal ideation.   Homework: Participate in [employer] and attending church; Identify activities to do regularly to stay active  Next Session: Review homework and emotional support.   Diagnosis: F33.1 major depressive affective disorder, recurrent, moderate  Plan:   Goals Alleviate depressive symptoms Recognize, accept, and cope with depressive feelings Develop healthy thinking patterns Develop healthy interpersonal relationships  Objectives target date for all objectives is 04/13/2023 Cooperate with a medication evaluation by a physician Verbalize an accurate understanding of depression Verbalize an understanding of the treatment Identify and replace thoughts that support depression Learn and implement behavioral strategies Verbalize an understanding and resolution of current interpersonal problems Learn and implement problem solving and decision making skills Learn and implement conflict resolution skills to resolve interpersonal problems Verbalize an understanding of healthy and unhealthy emotions verbalize insight into how past relationships may be influence current experiences with depression Use mindfulness and acceptance strategies and increase value based behavior  Increase hopeful statements about the future.  Interventions Consistent with treatment model, discuss how change in cognitive, behavioral, and interpersonal can help client alleviate depression CBT Behavioral activation help the client explore the relationship, nature of the dispute,  Help the client develop new interpersonal skills and relationships Conduct Problem so living therapy Teach conflict resolution skills Use a process-experiential approach Conduct TLDP Conduct ACT Evaluate need for psychotropic medication Monitor adherence to medication   The patient and clinician reviewed the treatment plan on 04/26/2022.  The patient approved of the treatment plan.   [name], PsyD

## 2022-05-19 NOTE — Telephone Encounter (Signed)
Called and spoke to [place] from [facility] and he states they do have the order for the cpap machine. He is just needing the sleep study to be faxed over. Printed and faxed sleep study to [facility] as requested. Corene Cornea states they will call and update patient on cpap. Nothing further needed

## 2022-05-20 ENCOUNTER — Other Ambulatory Visit: Payer: Self-pay | Admitting: Diagnostic Neuroimaging

## 2022-05-20 ENCOUNTER — Telehealth: Payer: Self-pay | Admitting: Internal Medicine

## 2022-05-20 NOTE — Progress Notes (Signed)
Chronic Care Management Pharmacy Note  05/21/2022 Name:  SHAQUERA ANSLEY MRN:  [id number] DOB:  [birth year]  Summary: Pt reports breathing is worse since stopping Trelegy Pt feels the sertraline works some days and doesn't others  Recommendations/Changes made from today's visit: -Recommended switching inhaler to [place] and reached out to pulmonary to discuss -Recommended setting up a PCP follow up appt if sertraline still is not helping consistently within 1 month  Plan: Follow up after discussion with pulmonologist  Subjective: VIVIENE THURSTON is an 76 y.o. year old female who is a primary patient of [name], Malka So, MD.  The CCM team was consulted for assistance with disease management and care coordination needs.    Engaged with patient by telephone for follow up visit in response to provider referral for pharmacy case management and/or care coordination services.   Consent to Services:  The patient was given information about Chronic Care Management services, agreed to services, and gave verbal consent prior to initiation of services.  Please see initial visit note for detailed documentation.   Patient Care Team: [name], [name], MD as PCP - General (Family Medicine) Fay Records, MD as PCP - Cardiology (Cardiology) Viona Gilmore, [facility] as Pharmacist (Pharmacist)  Recent office visits: 05/12/2022 [name] MD: Patient presented for nasal sinus congestion and post COVID infection.  Prescribed prednisone.  Patient could not afford Symbicort and left her off of it.  03/24/2022 [name] MD - Patient was seen for Moderate episode of recurrent major depressive disorder and additional concerns. Increased Sertraline to 50 mg daily. Follow up in 6 months.   01/22/2022 [name] MD - Patient was seen for Right shoulder pain, unspecified chronicity and additional issues. Referral to Orthopedic Surgery. Started tramadol 10 mg bid prn. Follow up in 2 months.   11/25/2021 [name]  MD - Patient was seen for anxiety and additional issues. Started Sertraline 25 mg daily. Increased Mirabegron to 50 mg daily. Follow up in 6 weeks.   Recent consult visits: 05/19/22 [name], PsyD (psychology): Patient presented for depression follow up. Unable to access notes.  04/20/2022 Baird [name] MD (pulmonology) - Patient was seen for Wheezing and additional concerns. Started Symbicort 80/4.98mg/act 2 puffs twice daily. Discontinued Trelegy and discontinue Singulair to see if it makes any difference in mood or asthma. Follow up in 6 months.  02/26/2022 TErroll LunaMD (surgery) - Patient was seen for papilloma of right breast. No medication changes.   02/01/2022 ERebekah ChesterfieldNP (CGraham - Patient was seen for CAD follow up. No medication changes. Follow up in 1 year.   01/18/2022 YBaird LyonsD, MD (Pulmonary Disease) - Patient was seen for obstructive sleep apnea and COPD. Referred to sleep center for mask fitting. Ordered CPAP supplies.  [facility] visits: [facility] hour) due to viral URI with cough.  [facility] started Medication Changes at Hospital Discharge: No medication changes Medications Discontinued at Hospital Discharge: No medication changes Medications that remain the same after Hospital Discharge:??  -All other medications will remain the same.     [facility] (4 hours) due to right breast seed lumpectomy.    [facility] traM[facility] (Ultram) Medication Changes at Hospital Discharge: No medication changes Medications Discontinued at Hospital Discharge: No medications discontinued Medications that remain the same after Hospital Discharge:??  -All other medications will  remain the same.    Patient was [facility] at [facility] on 03/07/2022 (3 minutes)  due to Severe episode of recurrent major depressive disorder, without psychotic features .    New?Medications Started at [facility] Discharge:?? No medications started Medication Changes at Hospital Discharge: Discussed increasing Zoloft to 50 mg daily and patient is in agreement.  Educated on medication and side effects patient verbalized understanding.  30-day supply of 25 mg was sent to patient's pharmacy of choice.  She is instructed to take 25 mg in addition to her current 25 mg to total of 50/day.  In addition her mail order pharmacy was contacted and was instructed to increase next month's dose of Zoloft in the prepackaged containers to 50 mg daily. Medications Discontinued at Hospital Discharge: No medications discontinued Medications that remain the same after Hospital Discharge:??  -All other medications will remain the same.    Objective:  Lab Results  Component Value Date   CREATININE 1.72 (H) 04/08/2022   BUN 22 04/08/2022   GFR 30.44 (L) 08/19/2021   GFRNONAA 30 (L) 04/08/2022   GFRAA 40 (L) 07/07/2020   NA 140 04/08/2022   K 4.2 04/08/2022   CALCIUM 9.1 04/08/2022   CO2 29 04/08/2022   GLUCOSE 125 (H) 04/08/2022    Lab Results  Component Value Date/Time   HGBA1C 6.3 03/24/2022 03:14 PM   HGBA1C 5.9 04/17/2021 12:47 PM   FRUCTOSAMINE 226 11/10/2020 09:26 AM   FRUCTOSAMINE 257 03/26/2019 10:54 AM   GFR 30.44 (L) 08/19/2021 10:56 AM   GFR 51.41 (L) 09/26/2019 01:02 PM   MICROALBUR 5.8 03/26/2020 10:55 AM   MICROALBUR 16.9 (H) 09/26/2019 01:02 PM    Last diabetic Eye exam:  Lab Results  Component Value Date/Time   HMDIABEYEEXA No Retinopathy 04/28/2021 12:00 AM    Last diabetic Foot exam: No results found for: "HMDIABFOOTEX"   Lab Results  Component Value Date   CHOL 143 09/11/2020   HDL 56 09/11/2020   LDLCALC 69 09/11/2020   LDLDIRECT 171.5 12/06/2011   TRIG 98 09/11/2020   CHOLHDL 2.6 09/11/2020       Latest Ref Rng & Units 11/05/2021    4:41 PM  05/19/2021    1:04 PM 01/25/2021    1:35 PM  Hepatic Function  Total Protein 6.5 - 8.1 g/dL 8.1  8.0  7.7   Albumin 3.5 - 5.0 g/dL 3.7  4.0  3.5   AST 15 - 41 U/L 29  15  21    ALT 0 - 44 U/L 17  9  14    Alk Phosphatase 38 - 126 U/L 57  60  59   Total Bilirubin 0.3 - 1.2 mg/dL 0.5  0.5  0.6     Lab Results  Component Value Date/Time   TSH 2.91 11/10/2020 09:26 AM   TSH 1.87 09/26/2019 01:02 PM       Latest Ref Rng & Units 11/05/2021    4:41 PM 05/19/2021    1:04 PM 01/25/2021    1:35 PM  CBC  WBC 4.0 - 10.5 K/uL 7.5  7.2  7.6   Hemoglobin 12.0 - 15.0 g/dL 15.1  15.1  15.1   Hematocrit 36.0 - 46.0 % 45.3  45.9  45.4   Platelets 150 - 400 K/uL 230  242  260     Lab Results  Component Value Date/Time   VD25OH 28 (L) 03/26/2020 10:41 AM   VD25OH 17.09 (L) 09/26/2019 01:02 PM   VD25OH 15.71 (L)  03/26/2019 10:54 AM    Clinical ASCVD: Yes  The ASCVD Risk score (Arnett DK, et al., 2019) failed to calculate for the following reasons:   The patient has a prior MI or stroke diagnosis       03/24/2022    2:49 PM 01/22/2022    2:36 PM 11/25/2021   10:20 PM  Depression screen PHQ 2/9  Decreased Interest 1 0 1  Down, Depressed, Hopeless 1 0 1  PHQ - 2 Score 2 0 2  Altered sleeping 1 3 3   Tired, decreased energy 2 2 2   Change in appetite 1 1 1   Feeling bad or failure about yourself  0 1 1  Trouble concentrating 0 0 0  Moving slowly or fidgety/restless 1 0 0  Suicidal thoughts 0 0 0  PHQ-9 Score 7 7 9   Difficult doing work/chores Not difficult at all  Somewhat difficult     Social History   Tobacco Use  Smoking Status Former   Packs/day: 0.20   Years: 15.00   Total pack years: 3.00   Types: Cigarettes   Start date: 43   Quit date: 09/07/2021   Years since quitting: 0.7  Smokeless Tobacco Never  Tobacco Comments   pt has been an on and off smoker since age 33   BP Readings from Last 3 Encounters:  04/30/22 106/69  04/20/22 128/74  04/13/22 (!) 159/79   Pulse  Readings from Last 3 Encounters:  04/30/22 70  04/20/22 63  04/13/22 (!) 55   Wt Readings from Last 3 Encounters:  04/30/22 220 lb (99.8 kg)  04/20/22 229 lb 12.8 oz (104.2 kg)  04/13/22 229 lb 8 oz (104.1 kg)   BMI Readings from Last 3 Encounters:  05/12/22 35.51 kg/m  04/30/22 35.51 kg/m  04/20/22 37.09 kg/m    Assessment/Interventions: Review of patient past medical history, allergies, medications, health status, including review of consultants reports, laboratory and other test data, was performed as part of comprehensive evaluation and provision of chronic care management services.   SDOH:  (Social Determinants of Health) assessments and interventions performed: Yes  SDOH Interventions    Flowsheet Row Chronic Care Management from 05/21/2022 in [place] at [place] from 11/25/2021 in [place] at [place] from 08/27/2021 in [place] at [place] Management from 05/08/2021 in [place] at [place] from 08/26/2020 in [place] at [place] Interventions -- -- -- -- Intervention Not Indicated  Housing Interventions -- -- -- -- Intervention Not Indicated  Transportation Interventions -- -- -- Intervention Not Indicated Intervention Not Indicated  Depression Interventions/Treatment  -- Medication, Counseling Counseling -- --  Financial Strain Interventions Other (Comment)  [working on assistance for inhaler] -- -- Intervention Not Indicated Intervention Not Indicated  Physical Activity Interventions -- -- -- -- Intervention Not Indicated  Stress Interventions -- -- -- -- Intervention Not Indicated  Social Connections Interventions -- -- -- -- Intervention Not Indicated       SDOH Screenings   Food Insecurity: No Food Insecurity (08/27/2021)  Housing: Low Risk  (08/27/2021)  Transportation Needs: No Transportation Needs  (08/27/2021)  Alcohol Screen: Low Risk  (08/26/2020)  Depression (PHQ2-9): Medium Risk (03/24/2022)  Financial Resource Strain: Medium Risk (05/21/2022)  Physical Activity: Inactive (08/27/2021)  Social Connections: Moderately Integrated (08/27/2021)  Stress: No Stress Concern Present (08/27/2021)  Tobacco Use: Medium Risk (05/12/2022)    CCM Care Plan  Allergies  Allergen Reactions   Oxycodone-Acetaminophen Hives and Itching    hallucinations   Percocet [Oxycodone-Acetaminophen] Hives, Itching and Other (See Comments)    hallucinations   Pravastatin Sodium Other (See Comments)    Muscle aches Muscle aches   Hydrocodone Other (See Comments)    "crazy dreams"   Aspirin Other (See Comments)    GI upset Other reaction(s): Other (See Comments) REACTION: GI upset    Medications Reviewed Today     Reviewed by Viona Gilmore, [facility] (Pharmacist) on 05/21/22 at 1207  Med List Status: <None>   Medication Order Taking? Sig Documenting Provider Last Dose Status Informant  acetaminophen (TYLENOL) 500 MG tablet [id number] No Take 1,000 mg by mouth as needed for moderate pain or headache.  [provider] Taking Active Self           Med Note Shari Heritage Feb 01, 2022 10:53 AM)    albuterol (VENTOLIN HFA) 108 (90 Base) MCG/ACT inhaler [id number] No INHALE ONE OR TWO PUFFS BY MOUTH INTO THE LUNGS EVERY SIX HOURS AS NEEDED FOR WHEEZING OR SHORTNESS OF BREATH Young, Kasandra Knudsen, MD Taking Active   Alirocumab (PRALUENT) 75 MG/ML SOAJ [id number] No Inject 75 mg into the skin every 14 (fourteen) days. Fay Records, MD Taking Active   aspirin 81 MG chewable tablet [id number] No Chew 81 mg by mouth daily. [provider] Taking Active Self  budesonide-formoterol (SYMBICORT) 80-4.5 MCG/ACT inhaler [id number] No Inhale 2 puffs then rinse mouth, twice daily Young, Clinton D, MD Taking Active   ezetimibe (ZETIA) 10 MG tablet [id number] No Take 1 tablet (10 mg total) by mouth daily.  Fay Records, MD Taking Active   famotidine (PEPCID) 20 MG tablet [id number] No TAKE ONE TABLET BY MOUTH EVERY MORNING and TAKE ONE TABLET BY MOUTH EVERYDAY AT BEDTIME [name], [name], MD Taking Active   fluticasone (FLONASE) 50 MCG/ACT nasal spray [id number] No PLACE 2 SPRAYS INTO BOTH NOSTRILS AT BEDTIME AS NEEDED FOR ALLERGIES. [name], [name], MD Taking Active   gabapentin (NEURONTIN) 300 MG capsule [id number] No Take 2 capsules (600 mg total) by mouth 2 (two) times daily. Penni Bombard, MD Taking Active Self  hydrochlorothiazide (HYDRODIURIL) 25 MG tablet [id number] No TAKE ONE TABLET BY MOUTH ONCE DAILY [name], [name], MD Taking Active   losartan (COZAAR) 100 MG tablet [id number] No TAKE ONE TABLET BY MOUTH EVERY MORNING [name], [name], MD Taking Active   metoprolol succinate (TOPROL-XL) 100 MG 24 hr tablet [id number] No TAKE ONE TABLET BY MOUTH AT NOON [name], Malka So, MD Taking Active   montelukast (SINGULAIR) 10 MG tablet [id number] No TAKE ONE TABLET BY MOUTH EVERYDAY AT BEDTIME [name], [name], MD Taking Active   MYRBETRIQ 50 MG TB24 tablet [id number] No Take 50 mg by mouth daily. [provider] Taking Active   Discontinued 05/21/22 1207 (Completed Course)   nitroGLYCERIN (NITROSTAT) 0.4 MG SL tablet [id number] No Place 1 tablet (0.4 mg total) under the tongue every 5 (five) minutes as needed for chest pain. Fay Records, MD Taking Active   ondansetron (ZOFRAN-ODT) 4 MG disintegrating tablet [id number] No 91m ODT q4 hours prn nausea/vomit FDeno Etienne DO Taking Active   polyethylene glycol powder (GLYCOLAX/MIRALAX) powder [id number] Take 17 g by mouth daily. LLevin Erp PUtahTaking Active Self  rosuvastatin (CRESTOR) 20 MG tablet [id number] Take 1 tablet (20 mg total) by mouth at bedtime. [name] Taking Active   sertraline (  ZOLOFT) 50 MG tablet [id number] No Take 50 mg by mouth daily. [provider] Taking Active   traMADol (ULTRAM) 50 MG tablet  [id number] No Take 1 tablet (50 mg total) by mouth every 6 (six) hours as needed. Erroll Luna, MD Taking Active             Patient Active Problem List   Diagnosis Date Noted   Moderate episode of recurrent major depressive disorder ([place]) 03/24/2022   PAD (peripheral artery disease) ([place]) 11/28/2021   OSA (obstructive sleep apnea) 01/16/2021   COPD mixed type ([place]) 01/16/2021   Fatigue 11/03/2020   B12 deficiency 11/03/2020   Chest pain 08/30/2020   Malignant neoplasm of kidney excluding renal pelvis, unspecified laterality ([place]) 09/26/2019   Vitamin D deficiency, unspecified 09/26/2019   Anxiety disorder, unspecified 03/26/2019   Tobacco use disorder 09/29/2018   Cervical myelopathy ([place]) 05/29/2018   ANA positive 12/08/2017   Cervicalgia 09/16/2017   Carpal tunnel syndrome of left wrist 07/21/2017   Chondrocalcinosis 07/21/2017   Neuropathy 07/21/2017   Orthostatic hypotension 05/28/2017   S/P angioplasty with stent 05/27/17 to LCX with DES  05/28/2017   CAD (coronary artery disease)    Polyneuropathy 05/25/2017   Chest pain with moderate risk for cardiac etiology 05/25/2017   Class 1 obesity with serious comorbidity and body mass index (BMI) of 34.0 to 34.9 in adult 12/06/2016   CKD (chronic kidney disease) stage 3, GFR 30-59 ml/min (HCC) 10/11/2016   Hot flashes, menopausal 10/01/2016   Chest pain with moderate risk of acute coronary syndrome 10/25/2014   Degenerative spondylolisthesis 10/15/2014   Spinal stenosis, lumbar region, with neurogenic claudication 02/12/2013   DM (diabetes mellitus), type 2 with renal complications ([place]) 45/09/8880   History of Rtr nephrectomy 2010, secondary to renal cell cancer 01/14/2009   PERSONAL HX COLONIC POLYPS 11/14/2008   Hyperlipidemia associated with type 2 diabetes mellitus ([place]) 11/16/2007   ESOPHAGEAL STRICTURE 10/12/2007   Hypertension with heart disease 01/19/2007   Allergic rhinitis 01/19/2007   GERD 01/19/2007     Immunization History  Administered Date(s) Administered   Fluad Quad(high Dose 65+) 05/05/2019, 06/09/2020, 04/17/2021, 04/20/2022   Influenza Split 05/23/2012, 06/07/2013   Influenza Whole 08/02/2005, 05/08/2007, 05/09/2008, 08/19/2009, 06/03/2010   Influenza, High Dose Seasonal PF 06/20/2014, 05/13/2015, 06/08/2016, 05/23/2017, 05/18/2018   PFIZER Comirnaty([place])Covid-19 Tri-Sucrose Vaccine 12/17/2020   Pfizer Covid-19 Vaccine Bivalent Booster 67yr & up 06/17/2021   Pneumococcal Conjugate-13 02/27/2007   Pneumococcal Polysaccharide-23 02/27/2007, 12/16/2011, 10/16/2014, 05/26/2017   Td 08/02/2001   Tdap 12/16/2011   Zoster, Live 12/27/2012   Patient reports is not smoking at all right now. Patient thinks about it some but is determined she is not going to go back to this.  Patient had COVID and son had COVID and he was hospitalized and put on a ventilator. She was sick and couldn't go see him because she had COVID. He came home on Monday and has seen him several times this week. She takes him to dialysis 3 times a week now. Her daughter was taking him but her hours changed.  Patient is still feeling tired because of COVID. She is not sleeping well right now because she couldn't stand the CPAP machine she had. She wakes up frequently during the night.   Patient reports she saw Dr. SMichail Sermonon Wednesday and this has been going well. He is helping with ways to cope with depression. She reports everything happened at once and that was why everything got out of  control. She reports she felt like her room was her safe space.  Patient isn't sure what the cost would be for the Trelegy. She was only on samples of it. She thinks she is in the donut hole right now. Patient reports the Symbicort was > $100. Patient was breathing better on the Trelegy. Patient reports $45 a month is doable.   Conditions to be addressed/monitored:  Hypertension, Hyperlipidemia, Diabetes, Coronary Artery  Disease, GERD, COPD, Tobacco use, Overactive Bladder, Gout, and Allergic Rhinitis  Conditions addressed this visit: COPD, tobacco use, depression  Care Plan : CCM Pharmacy Care Plan  Updates made by Viona Gilmore, [place] since 05/21/2022 12:00 AM     Problem: Problem: Hypertension, Hyperlipidemia, Diabetes, Coronary Artery Disease, GERD, COPD, Tobacco use, Overactive Bladder, Gout, and Allergic Rhinitis      Long-Range Goal: Patient-Specific Goal   Start Date: 05/08/2021  Expected End Date: 05/08/2022  Recent Progress: On track  Priority: High  Note:   Current Barriers:  Unable to independently afford treatment regimen Unable to independently monitor therapeutic efficacy Unable to self administer medications as prescribed  Pharmacist Clinical Goal(s):  Patient will verbalize ability to afford treatment regimen achieve adherence to monitoring guidelines and medication adherence to achieve therapeutic efficacy through collaboration with PharmD and provider.   Interventions: 1:1 collaboration with [name], [name], MD regarding development and update of comprehensive plan of care as evidenced by provider attestation and co-signature Inter-disciplinary care team collaboration (see longitudinal plan of care) Comprehensive medication review performed; medication list updated in electronic medical record  Hypertension (BP goal <130/80) -Controlled -Current treatment: Losartan 100 mg 1 tablet daily - Appropriate, Query effective, Safe, Accessible Metoprolol succinate 100 mg 1 tablet daily - Appropriate, Query effective, Safe, Accessible -Medications previously tried: [name] -Current home readings: 120-140s; (usually in morning - 3 times a week) -Current dietary habits: cooks with regular salt seldom; uses seasoned salt; doesn't buy much canned -Current exercise habits: not doing much right now -Denies hypotensive/hypertensive symptoms -Educated on BP goals and benefits of  medications for prevention of heart attack, stroke and kidney damage; Exercise goal of 150 minutes per week; Importance of home blood pressure monitoring; Proper BP monitoring technique; -Counseled to monitor BP at home a few times weekly, document, and provide log at future appointments -Counseled on diet and exercise extensively Recommended to continue current medication  Hyperlipidemia: (LDL goal < 55) -Controlled -Current treatment: Rosuvastatin 20 mg 1 tablet daily -Appropriate, Query effective, Safe, Accessible Ezetimibe 10 mg 1 tablet daily - Appropriate, Query effective, Safe, Accessible Praluent 75 mg inject every 14 days - Appropriate, Query effective, Safe, Accessible -Medications previously tried: none  -Current dietary patterns: tries to stay away from fried foods and usually has baked and broiled and doesn't eat beef much -Current exercise habits: not much right now -Educated on Cholesterol goals;  Benefits of statin for ASCVD risk reduction; Importance of limiting foods high in cholesterol; Exercise goal of 150 minutes per week; -Counseled on diet and exercise extensively Assessed patient finances. Applied for [employer] and sent information to pharmacy.  CAD (Goal: prevent heart events) -Controlled -Current treatment  Metoprolol succinate 100 mg 1 tablet daily - Appropriate, Effective, Safe, Accessible Rosuvastatin 20 mg 1 tablet daily -  Appropriate, Effective, Safe, Accessible Aspirin 81 mg 1 tablet daily - Appropriate, Effective, Safe, Accessible Nitroglycerin 0.4 mg 1 tablet as needed - Appropriate, Effective, Safe, Accessible -Medications previously tried: none  -Recommended to continue current medication   Diabetes (A1c goal <6.5%) -  Controlled -Current medications: No medications -Medications previously tried: n/a  -Current home glucose readings fasting glucose: uses a freestyle libre post prandial glucose: freestyle libre -Denies  hypoglycemic/hyperglycemic symptoms -Current meal patterns:  breakfast: n/a  lunch: n/a  dinner: n/a snacks: n/a drinks: water and 2 sodas a week -Current exercise: not much right now -Educated on A1c and blood sugar goals; Exercise goal of 150 minutes per week; Counseled to check feet daily and get yearly eye exams -Counseled to check feet daily and get yearly eye exams -Counseled on diet and exercise extensively  COPD (Goal: control symptoms and prevent exacerbations) -Controlled -Current treatment  Symbicort - not taking due to cost Albuterol as needed - Appropriate, Effective, Safe, Accessible -Medications previously tried: Trelegy (cost?) -Gold Grade: Gold 1 (FEV1>80%) -Current COPD Classification:  A (low sx, <2 exacerbations/yr) -MMRC/CAT score: n/a -Pulmonary function testing: 05/04/21 -Exacerbations requiring treatment in last 6 months: none -Patient reports consistent use of maintenance inhaler -Frequency of rescue inhaler use: depends on weather (twice a week usually) -Counseled on Proper inhaler technique; Benefits of consistent maintenance inhaler use When to use rescue inhaler -Counseled on diet and exercise extensively Recommended to continue current medication Assessed patient finances. Applied for Breztri PAP and got approved. Sent message to pulmonologist about switching due to cost.  Allergic rhinitis (Goal: minimize symptoms) -Controlled -Current treatment  Montelukast 10 mg 1 tablet at bedtime  -Appropriate, Effective, Safe, Accessible Fluticasone 50 mcg/act as needed - Appropriate, Effective, Safe, Accessible -Medications previously tried: none  -Recommended to continue current medication  Tobacco use (Goal quit smoking) -Controlled -Previous quit attempts: n/a -Current treatment  No medications -Patient smokes After 30 minutes of waking -Patient triggers include: anxiety -On a scale of 1-10, reports MOTIVATION to quit is 10 -On a scale of 1-10,  reports CONFIDENCE in quitting is 10 -Provided contact information for [place] Quit Line (1-800-QUIT-NOW) and encouraged patient to reach out to this group for support. - Congratulated patient on continued smoking cessation.  GERD (Goal: minimize symptoms of heartburn) -Controlled -Current treatment  Famotidine 20 mg 1 tablet twice daily - Appropriate, Effective, Query Safe, Accessible -Medications previously tried: pantoprazole (not needed) -Counseled on non-pharmacologic management of symptoms such as elevating the head of your bed, avoiding eating 2-3 hours before bed, avoiding triggering foods such as acidic, spicy, or fatty foods, eating smaller meals, and wearing clothes that are loose around the waist Recommended reducing famotidine to daily due to kidney function.  Neuropathy (Goal: minimize symptoms) -Not ideally controlled -Current treatment  Gabapentin 300 mg 2 capsules twice daily - Appropriate, Effective, Safe, Accessible -Medications previously tried: none  -Recommended moving second dose of gabapentin to bedtime as this can cause some daytime fatigue.  Overactive bladder (Goal: minimize symptoms) -Controlled -Current treatment  Myrbetriq 50 mg 1 tablet daily - Appropriate, Effective, Safe, Accessible -Medications previously tried: tolterodine (side effects) -Recommended reaching out to urology for samples or an alternative. Assessed patient finances. Patient would not qualify for PAP as she has insurance coverage.  Depression/Anxiety (Goal: minimize symptoms) -Uncontrolled -Current treatment: Sertraline 50 mg 1 tablet daily - Appropriate, Query effective, Safe, Accessible -Medications previously tried/failed: none -PHQ9: 9 -GAD7: 5 -Educated on Benefits of medication for symptom control Benefits of cognitive-behavioral therapy with or without medication -Recommended visit with PCP if patient still does not feel like this is helping enough in 1 month.   Health  Maintenance -Vaccine gaps: shingrix, COVID vaccine (she is getting the booster) -Current therapy:  Miralax as needed every other  day Acetaminophen 500 mg as needed -Educated on Cost vs benefit of each product must be carefully weighed by individual consumer -Patient is satisfied with current therapy and denies issues -Recommended to continue current medication  Patient Goals/Self-Care Activities Patient will:  - take medications as prescribed as evidenced by patient report and record review focus on medication adherence by using adherence packaging check blood pressure weekly, document, and provide at future appointments target a minimum of 150 minutes of moderate intensity exercise weekly  Follow Up Plan: The care management team will reach out to the patient again over the next 14 days.       Medication Assistance: Application for [employer]  medication assistance program. in process.  Anticipated assistance start date 06/21/22.  See plan of care for additional detail.  Compliance/Adherence/Medication fill history: Care Gaps: COVID booster, shingrix, tetanus, mammogram, urine microalbumin, eye exam (pt is working on getting rescheduled) Last BP - 128/74 on 04/20/2022 Last A1C - 6.3 on 03/24/2022  Star-Rating Drugs: Losartan 100 mg - last filled 05/03/2022 30 DS at [facility] Rosuvastatin 20 mg - last filled 05/03/2022 30 DS at [facility]   Patient's preferred pharmacy is:  [facility] [address], [place] [address] Phone: [phone] Fax: [id number], [place] [address] Phone: [phone] Fax: [phone]  [id number] [place], [id number]: [phone]: [phone] [facility] [place] [address] [place]: [phone]: [phone] [facility] [place]: [phone]: [phone]  Uses pill box? Yes - 7 days a week - morning noon and night Pt endorses 95% compliance - misses midday  We discussed: Benefits of medication synchronization, packaging and delivery as well as enhanced pharmacist oversight with [facility]. Patient decided to: Utilize [facility] pharmacy for medication synchronization, packaging and delivery  Care Plan and Follow Up Patient Decision:  Patient agrees to Care Plan and Follow-up.  Plan: The care management team will reach out to the patient again over the next 14 days.  MJeni Salles PharmD, [place] Pharmacist [place] Patient obtains medications through Adherence Packaging  30 Days   Patient is due for next adherence delivery on: 06/02/2022  This delivery to include: Praluent Inj 75 mg - inject 75 mg every 14 days Gabapentin 300 mg - take 2 tablets at lunch and 2 tablets at bedtime Ezetimibe 10 mg  - take one tablet at breakfast Famotidine 20 mg - take one tablet at breakfast and one tablet at bedtime Rosuvastatin 20 mg - take one tablet at bedtime Metoprolol Suc 100 mg - take one tablet at lunch Losartan 100 mg - take one tablet at breakfast HCTZ 25 mg - take one tablet at breakfast Myrbetriq 50 mg - take one tablet at breakfast Sertraline 50 mg - take one tablet at lunch   Patient will need a short fill: No short fills needed   Coordinated acute fill: No acute fills needed  Confirmed delivery date of 06/02/2022, advised patient that pharmacy will contact them the  morning of delivery.

## 2022-05-20 NOTE — Telephone Encounter (Signed)
OV notes have been printed to be faxed to [facility].

## 2022-05-21 ENCOUNTER — Other Ambulatory Visit: Payer: Self-pay

## 2022-05-21 ENCOUNTER — Ambulatory Visit (INDEPENDENT_AMBULATORY_CARE_PROVIDER_SITE_OTHER): Payer: Medicare HMO | Admitting: Pharmacist

## 2022-05-21 DIAGNOSIS — F331 Major depressive disorder, recurrent, moderate: Secondary | ICD-10-CM

## 2022-05-21 DIAGNOSIS — J449 Chronic obstructive pulmonary disease, unspecified: Secondary | ICD-10-CM

## 2022-05-21 NOTE — Patient Instructions (Signed)
Hi [name],  It was great to talk to you again!  Please reach out to me if you have any questions or need anything before I reach back out!  Best, Maddie  [name], PharmD, [facility] [place]   Visit Information   Goals Addressed   None    Patient Care Plan: CCM Pharmacy Care Plan     Problem Identified: Problem: Hypertension, Hyperlipidemia, Diabetes, Coronary Artery Disease, GERD, COPD, Tobacco use, Overactive Bladder, Gout, and Allergic Rhinitis      Long-Range Goal: Patient-Specific Goal   Start Date: 05/08/2021  Expected End Date: 05/08/2022  Recent Progress: On track  Priority: High  Note:   Current Barriers:  Unable to independently afford treatment regimen Unable to independently monitor therapeutic efficacy Unable to self administer medications as prescribed  Pharmacist Clinical Goal(s):  Patient will verbalize ability to afford treatment regimen achieve adherence to monitoring guidelines and medication adherence to achieve therapeutic efficacy through collaboration with PharmD and provider.   Interventions: 1:1 collaboration with [name], [name], MD regarding development and update of comprehensive plan of care as evidenced by provider attestation and co-signature Inter-disciplinary care team collaboration (see longitudinal plan of care) Comprehensive medication review performed; medication list updated in electronic medical record  Hypertension (BP goal <130/80) -Controlled -Current treatment: Losartan 100 mg 1 tablet daily - Appropriate, Query effective, Safe, Accessible Metoprolol succinate 100 mg 1 tablet daily - Appropriate, Query effective, Safe, Accessible -Medications previously tried: [name] -Current home readings: 120-140s; (usually in morning - 3 times a week) -Current dietary habits: cooks with regular salt seldom; uses seasoned salt; doesn't buy much canned -Current exercise  habits: not doing much right now -Denies hypotensive/hypertensive symptoms -Educated on BP goals and benefits of medications for prevention of heart attack, stroke and kidney damage; Exercise goal of 150 minutes per week; Importance of home blood pressure monitoring; Proper BP monitoring technique; -Counseled to monitor BP at home a few times weekly, document, and provide log at future appointments -Counseled on diet and exercise extensively Recommended to continue current medication  Hyperlipidemia: (LDL goal < 55) -Controlled -Current treatment: Rosuvastatin 20 mg 1 tablet daily -Appropriate, Query effective, Safe, Accessible Ezetimibe 10 mg 1 tablet daily - Appropriate, Query effective, Safe, Accessible Praluent 75 mg inject every 14 days - Appropriate, Query effective, Safe, Accessible -Medications previously tried: none  -Current dietary patterns: tries to stay away from fried foods and usually has baked and broiled and doesn't eat beef much -Current exercise habits: not much right now -Educated on Cholesterol goals;  Benefits of statin for ASCVD risk reduction; Importance of limiting foods high in cholesterol; Exercise goal of 150 minutes per week; -Counseled on diet and exercise extensively Assessed patient finances. Applied for [employer] and sent information to pharmacy.  CAD (Goal: prevent heart events) -Controlled -Current treatment  Metoprolol succinate 100 mg 1 tablet daily - Appropriate, Effective, Safe, Accessible Rosuvastatin 20 mg 1 tablet daily -  Appropriate, Effective, Safe, Accessible Aspirin 81 mg 1 tablet daily - Appropriate, Effective, Safe, Accessible Nitroglycerin 0.4 mg 1 tablet as needed - Appropriate, Effective, Safe, Accessible -Medications previously tried: none  -Recommended to continue current medication   Diabetes (A1c goal <6.5%) -Controlled -Current medications: No medications -Medications previously tried: n/a  -Current home glucose  readings fasting glucose: uses a freestyle libre post prandial glucose: freestyle libre -Denies hypoglycemic/hyperglycemic symptoms -Current meal patterns:  breakfast: n/a  lunch: n/a  dinner: n/a snacks: n/a drinks: water and 2 sodas  a week -Current exercise: not much right now -Educated on A1c and blood sugar goals; Exercise goal of 150 minutes per week; Counseled to check feet daily and get yearly eye exams -Counseled to check feet daily and get yearly eye exams -Counseled on diet and exercise extensively  COPD (Goal: control symptoms and prevent exacerbations) -Controlled -Current treatment  Symbicort - not taking due to cost Albuterol as needed - Appropriate, Effective, Safe, Accessible -Medications previously tried: Trelegy (cost?) -Gold Grade: Gold 1 (FEV1>80%) -Current COPD Classification:  A (low sx, <2 exacerbations/yr) -MMRC/CAT score: n/a -Pulmonary function testing: 05/04/21 -Exacerbations requiring treatment in last 6 months: none -Patient reports consistent use of maintenance inhaler -Frequency of rescue inhaler use: depends on weather (twice a week usually) -Counseled on Proper inhaler technique; Benefits of consistent maintenance inhaler use When to use rescue inhaler -Counseled on diet and exercise extensively Recommended to continue current medication Assessed patient finances. Applied for [employer] PAP and got approved. Sent message to pulmonologist about switching due to cost.  Allergic rhinitis (Goal: minimize symptoms) -Controlled -Current treatment  Montelukast 10 mg 1 tablet at bedtime  -Appropriate, Effective, Safe, Accessible Fluticasone 50 mcg/act as needed - Appropriate, Effective, Safe, Accessible -Medications previously tried: none  -Recommended to continue current medication  Tobacco use (Goal quit smoking) -Controlled -Previous quit attempts: n/a -Current treatment  No medications -Patient smokes After 30 minutes of waking -Patient  triggers include: anxiety -On a scale of 1-10, reports MOTIVATION to quit is 10 -On a scale of 1-10, reports CONFIDENCE in quitting is 10 -Provided contact information for [place] [facility] (1-800-QUIT-NOW) and encouraged patient to reach out to this group for support. - Congratulated patient on continued smoking cessation.  GERD (Goal: minimize symptoms of heartburn) -Controlled -Current treatment  Famotidine 20 mg 1 tablet twice daily - Appropriate, Effective, Query Safe, Accessible -Medications previously tried: pantoprazole (not needed) -Counseled on non-pharmacologic management of symptoms such as elevating the head of your bed, avoiding eating 2-3 hours before bed, avoiding triggering foods such as acidic, spicy, or fatty foods, eating smaller meals, and wearing clothes that are loose around the waist Recommended reducing famotidine to daily due to kidney function.  Neuropathy (Goal: minimize symptoms) -Not ideally controlled -Current treatment  Gabapentin 300 mg 2 capsules twice daily - Appropriate, Effective, Safe, Accessible -Medications previously tried: none  -Recommended moving second dose of gabapentin to bedtime as this can cause some daytime fatigue.  Overactive bladder (Goal: minimize symptoms) -Controlled -Current treatment  Myrbetriq 50 mg 1 tablet daily - Appropriate, Effective, Safe, Accessible -Medications previously tried: tolterodine (side effects) -Recommended reaching out to urology for samples or an alternative. Assessed patient finances. Patient would not qualify for PAP as she has insurance coverage.  Depression/Anxiety (Goal: minimize symptoms) -Uncontrolled -Current treatment: Sertraline 50 mg 1 tablet daily - Appropriate, Query effective, Safe, Accessible -Medications previously tried/failed: none -PHQ9: 9 -GAD7: 5 -Educated on Benefits of medication for symptom control Benefits of cognitive-behavioral therapy with or without medication -Recommended  visit with PCP if patient still does not feel like this is helping enough in 1 month.   Health Maintenance -Vaccine gaps: shingrix, COVID vaccine (she is getting the booster) -Current therapy:  Miralax as needed every other day Acetaminophen 500 mg as needed -Educated on Cost vs benefit of each product must be carefully weighed by individual consumer -Patient is satisfied with current therapy and denies issues -Recommended to continue current medication  Patient Goals/Self-Care Activities Patient will:  - take medications as prescribed as  evidenced by patient report and record review focus on medication adherence by using adherence packaging check blood pressure weekly, document, and provide at future appointments target a minimum of 150 minutes of moderate intensity exercise weekly  Follow Up Plan: The care management team will reach out to the patient again over the next 14 days.        Patient verbalizes understanding of instructions and care plan provided today and agrees to view in [place]. Active MyChart status and patient understanding of how to access instructions and care plan via MyChart confirmed with patient.    The pharmacy team will reach out to the patient again over the next 14 days.   Viona Gilmore, [facility]

## 2022-05-21 NOTE — Telephone Encounter (Signed)
-----   Message from Viona Gilmore, [facility] sent at 05/20/2022 10:12 AM EDT ----- Regarding: Sertraline refill Hi,  Prior to getting the sertraline from an ED provider, Ms. Cielo was getting it prescribed by Dr. [name]. Can you send a refill to [facility]? She is almost out of it.  Thanks! Maddie

## 2022-05-24 ENCOUNTER — Other Ambulatory Visit: Payer: Self-pay

## 2022-05-24 MED ORDER — BREZTRI AEROSPHERE 160-9-4.8 MCG/ACT IN AERO: 2.0000 | INHALATION_SPRAY | Freq: Two times a day (BID) | RESPIRATORY_TRACT | 0 refills | Status: DC

## 2022-05-26 ENCOUNTER — Telehealth: Payer: Self-pay | Admitting: Internal Medicine

## 2022-05-26 MED ORDER — SERTRALINE HCL 50 MG PO TABS: 50.0000 mg | ORAL_TABLET | Freq: Every day | ORAL | 1 refills | Status: DC

## 2022-05-26 NOTE — Addendum Note (Signed)
Addended by: Rodrigo Ran on: 05/26/2022 04:08 PM   Modules accepted: Orders

## 2022-05-26 NOTE — Telephone Encounter (Signed)
Office noted printed out. Faxed to [facility] at [place]. Nothing further needed

## 2022-05-27 ENCOUNTER — Telehealth: Payer: Self-pay | Admitting: Internal Medicine

## 2022-05-27 ENCOUNTER — Other Ambulatory Visit: Payer: Self-pay | Admitting: Internal Medicine

## 2022-05-27 NOTE — Telephone Encounter (Signed)
OV notes have been printed to be faxed to provided fax number. Nothing further needed.

## 2022-05-31 ENCOUNTER — Ambulatory Visit (INDEPENDENT_AMBULATORY_CARE_PROVIDER_SITE_OTHER): Payer: Medicare HMO | Admitting: Family Medicine

## 2022-05-31 ENCOUNTER — Encounter: Payer: Self-pay | Admitting: Family Medicine

## 2022-05-31 VITALS — BP 128/82 | HR 79 | Temp 98.5°F | Resp 16 | Ht 66.0 in | Wt 233.0 lb

## 2022-05-31 DIAGNOSIS — R5383 Other fatigue: Secondary | ICD-10-CM

## 2022-05-31 DIAGNOSIS — F419 Anxiety disorder, unspecified: Secondary | ICD-10-CM | POA: Diagnosis not present

## 2022-05-31 DIAGNOSIS — R69 Illness, unspecified: Secondary | ICD-10-CM | POA: Diagnosis not present

## 2022-05-31 DIAGNOSIS — I119 Hypertensive heart disease without heart failure: Secondary | ICD-10-CM

## 2022-05-31 DIAGNOSIS — G4733 Obstructive sleep apnea (adult) (pediatric): Secondary | ICD-10-CM | POA: Diagnosis not present

## 2022-05-31 NOTE — Patient Instructions (Addendum)
A few things to remember from today's visit:  Fatigue, unspecified type  Anxiety disorder, unspecified type  OSA (obstructive sleep apnea)  No changes today. I think the CPAP and getting more hours of sleep are going to help the most. Monitor for new symptoms. No changes in your medications today.  If you need refills for medications you take chronically, please call your pharmacy. Do not use My Chart to request refills or for acute issues that need immediate attention. If you send a my chart message, it may take a few days to be addressed, specially if I am not in the office.  Please be sure medication list is accurate. If a new problem present, please set up appointment sooner than planned today.

## 2022-05-31 NOTE — Progress Notes (Unsigned)
ACUTE VISIT Chief Complaint  Patient presents with   Follow-up    Tiredness after COVID 19 infection   HPI: [name] is a 76 y.o. female with hx of COPD,DM II, GERD,HTN,CAD,OSA,OA,CKD III,and anxiety here today complaining of persistent fatigue after recovering from COVID-19 two weeks ago. She reports that her energy levels have not returned to normal since her illness. She has a history of fatigue prior to COVID-19, but the recent infection has exacerbated the issue. Her appetite has not returned to normal since her COVID-19 infection. She is drinking plenty of fluids but reports that some foods do not taste as good as they used to, and her sense of smell is not as it was before the illness.  She denies any residual cough or shortness of breath but reports experiencing occasional wheezing. She mentions that her pulmonologist has recently changed her COPD treatment.   Her son and daughter were sick around the same time. Her daughter recovered in a couple days, her son was in the ICU and has been discharged home. She has had to help him with taking him for dialysis and follow up appts. Lab Results  Component Value Date   WBC 7.5 11/05/2021   HGB 15.1 (H) 11/05/2021   HCT 45.3 11/05/2021   MCV 91.1 11/05/2021   PLT 230 11/05/2021   Anxiety:She is currently taking Sertraline 50 mg daily and seeing psychotherapist. She reports that the medication has been slowly helping.  She also reports having difficulty sleeping, only managing to sleep for two or three hours at a time before waking up.  She has not tolerated CPAP machine and is awaiting a new mask.  HTN on Metoprolol succinate 100 mg daily, Losartan 100 mg daily, and HCTZ 25 mg daily. Negative for severe/frequent headache, visual changes, chest pain, palpitation, focal weakness, or worsening edema. Lab Results  Component Value Date   CREATININE 1.72 (H) 04/08/2022   BUN 22 04/08/2022   NA 140 04/08/2022   K 4.2 04/08/2022    CL 105 04/08/2022   CO2 29 04/08/2022    Review of Systems  Constitutional:  Positive for activity change and appetite change. Negative for fever.  HENT:  Positive for postnasal drip. Negative for mouth sores, nosebleeds and sore throat.   Gastrointestinal:  Negative for abdominal pain, nausea and vomiting.       Negative for changes in bowel habits.  Endocrine: Negative for cold intolerance and heat intolerance.  Genitourinary:  Negative for decreased urine volume, dysuria and hematuria.  Skin:  Negative for rash.  Allergic/Immunologic: Positive for environmental allergies.  Neurological:  Negative for syncope and facial asymmetry.  Psychiatric/Behavioral:  Positive for sleep disturbance. The patient is nervous/anxious.   Rest see pertinent positives and negatives per HPI.  Current Outpatient Medications on File Prior to Visit  Medication Sig Dispense Refill   acetaminophen (TYLENOL) 500 MG tablet Take 1,000 mg by mouth as needed for moderate pain or headache.      albuterol (VENTOLIN HFA) 108 (90 Base) MCG/ACT inhaler INHALE ONE OR TWO PUFFS BY MOUTH INTO THE LUNGS EVERY SIX HOURS AS NEEDED FOR WHEEZING OR SHORTNESS OF BREATH 8.5 g 12   Alirocumab (PRALUENT) 75 MG/ML SOAJ Inject 75 mg into the skin every 14 (fourteen) days. 2 mL 11   aspirin 81 MG chewable tablet Chew 81 mg by mouth daily.     Budeson-Glycopyrrol-Formoterol (BREZTRI AEROSPHERE) 160-9-4.8 MCG/ACT AERO Inhale 2 puffs into the lungs in the morning and at bedtime.  2 each 0   budesonide-formoterol (SYMBICORT) 80-4.5 MCG/ACT inhaler Inhale 2 puffs then rinse mouth, twice daily 1 each 12   ezetimibe (ZETIA) 10 MG tablet TAKE ONE TABLET BY MOUTH EVERY MORNING 90 tablet 2   famotidine (PEPCID) 20 MG tablet TAKE ONE TABLET BY MOUTH EVERY MORNING and TAKE ONE TABLET BY MOUTH EVERYDAY AT BEDTIME 180 tablet 2   fluticasone (FLONASE) 50 MCG/ACT nasal spray PLACE 2 SPRAYS INTO BOTH NOSTRILS AT BEDTIME AS NEEDED FOR ALLERGIES. 48 mL 1    gabapentin (NEURONTIN) 300 MG capsule Take 2 capsules (600 mg total) by mouth 2 (two) times daily. 360 capsule 4   hydrochlorothiazide (HYDRODIURIL) 25 MG tablet TAKE ONE TABLET BY MOUTH ONCE DAILY 90 tablet 2   losartan (COZAAR) 100 MG tablet TAKE ONE TABLET BY MOUTH EVERY MORNING 90 tablet 2   metoprolol succinate (TOPROL-XL) 100 MG 24 hr tablet TAKE ONE TABLET BY MOUTH AT NOON 90 tablet 1   montelukast (SINGULAIR) 10 MG tablet TAKE ONE TABLET BY MOUTH EVERYDAY AT BEDTIME 30 tablet 3   MYRBETRIQ 50 MG TB24 tablet Take 50 mg by mouth daily.     nitroGLYCERIN (NITROSTAT) 0.4 MG SL tablet Place 1 tablet (0.4 mg total) under the tongue every 5 (five) minutes as needed for chest pain. 25 tablet 2   ondansetron (ZOFRAN-ODT) 4 MG disintegrating tablet '4mg'$  ODT q4 hours prn nausea/vomit 20 tablet 0   polyethylene glycol powder (GLYCOLAX/MIRALAX) powder Take 17 g by mouth daily. 500 g 3   rosuvastatin (CRESTOR) 20 MG tablet Take 1 tablet (20 mg total) by mouth at bedtime. 90 tablet 3   sertraline (ZOLOFT) 50 MG tablet Take 1 tablet (50 mg total) by mouth daily. 90 tablet 1   traMADol (ULTRAM) 50 MG tablet Take 1 tablet (50 mg total) by mouth every 6 (six) hours as needed. 20 tablet 0   No current facility-administered medications on file prior to visit.   Past Medical History:  Diagnosis Date   Allergy    Anxiety    C. difficile diarrhea    history of   CAD (coronary artery disease), native coronary artery    a. 05/2017 showed 100% mLCx tx with DES, otherwise 20% mRCA, 50% ramus, 30% dLAD, 20% prox-mid LAD, 20% mLAD treated medically.    Cancer [facility])    Right kidney CA removed.    CKD (chronic kidney disease) stage 3, GFR 30-59 ml/min (HCC) 10/11/2016   s/p R nephrectomy   Colon polyps 2008   HYPERPLASTIC   COPD (chronic obstructive pulmonary disease) ([place])    former smoker   Gastritis    GERD (gastroesophageal reflux disease)    History of echocardiogram    Echo 3/18:  Moderate LVH, EF  69-48, grade 1 diastolic dysfunction, calcified aortic valve, mild MR, moderate LAE   History of nuclear stress test    Myoview 3/18: Mod size and intensity fixed septal defect, may be artifact.Opposite mod size and intensity lat defect, which is reversible and could represent ischemia or possibly artifact (SDS 4). LVEF 71% with normal wall motion. Intermediate risk study. >> images reviewed with Dr. Dorris Carnes - no sig ischemia; med rx    Hx of cardiovascular stress test    Lexiscan Myoview 6/16:  EF 70%, no scar or ischemia; Low Risk   Hyperlipidemia    Hypertension    Neuropathy    Orthostatic hypotension 05/28/2017   OSA (obstructive sleep apnea) 01/16/2021   Osteoarthritis    Pre-diabetes  no meds   Rheumatoid arthritis (HCC)    RA (Dr. Ouida Sills) Bilateral hands   S/P angioplasty with stent 05/27/17 to LCX with DES  05/28/2017   Sleep apnea 2023   does not use CPAP   Thyroid disease    Allergies  Allergen Reactions   Oxycodone-Acetaminophen Hives and Itching    hallucinations   Percocet [Oxycodone-Acetaminophen] Hives, Itching and Other (See Comments)    hallucinations   Pravastatin Sodium Other (See Comments)    Muscle aches Muscle aches   Hydrocodone Other (See Comments)    "crazy dreams"   Aspirin Other (See Comments)    GI upset Other reaction(s): Other (See Comments) REACTION: GI upset    Social History   Socioeconomic History   Marital status: Divorced    Spouse name: Not on file   Number of children: 3   Years of education: 12   Highest education level: Not on file  Occupational History   Occupation: Retired    Fish farm manager: RETIRED  Tobacco Use   Smoking status: Former    Packs/day: 0.20    Years: 15.00    Total pack years: 3.00    Types: Cigarettes    Start date: 1970    Quit date: 09/07/2021    Years since quitting: 0.7   Smokeless tobacco: Never   Tobacco comments:    pt has been an on and off smoker since age 32  Vaping Use   Vaping Use: Never  used  Substance and Sexual Activity   Alcohol use: No    Alcohol/week: 0.0 standard drinks of alcohol   Drug use: No   Sexual activity: Not Currently    Birth control/protection: Surgical    Comment: hyst  Other Topics Concern   Not on file  Social History Narrative   Single, dgtr lives with her   Current on/off smoker   Alcohol use- no   Drug use-no   Regular Exercise-yes   Social Determinants of Health   Financial Resource Strain: Medium Risk (05/21/2022)   Overall Financial Resource Strain (CARDIA)    Difficulty of Paying Living Expenses: Somewhat hard  Food Insecurity: No Food Insecurity (08/27/2021)   Hunger Vital Sign    Worried About Running Out of Food in the Last Year: Never true    [place] in the Last Year: Never true  Transportation Needs: No Transportation Needs (08/27/2021)   PRAPARE - Hydrologist (Medical): No    Lack of Transportation (Non-Medical): No  Physical Activity: Inactive (08/27/2021)   Exercise Vital Sign    Days of Exercise per Week: 0 days    Minutes of Exercise per Session: 0 min  Stress: No Stress Concern Present (08/27/2021)   [place]    Feeling of Stress : Only a little  Social Connections: Moderately Integrated (08/27/2021)   Social Connection and Isolation Panel [NHANES]    Frequency of Communication with Friends and Family: More than three times a week    Frequency of Social Gatherings with Friends and Family: More than three times a week    Attends Religious Services: More than 4 times per year    Active Member of [employer] or Organizations: Yes    Attends Archivist Meetings: More than 4 times per year    Marital Status: Divorced   Vitals:   05/31/22 1149  BP: 128/82  Pulse: 79  Resp: 16  Temp: 98.5 F (36.9  C)  SpO2: 94%  Body mass index is 37.61 kg/m.  Physical Exam Vitals and nursing note reviewed.  Constitutional:       General: She is not in acute distress.    Appearance: She is well-developed.  HENT:     Head: Normocephalic and atraumatic.     Mouth/Throat:     Mouth: Mucous membranes are moist.  Eyes:     Conjunctiva/sclera: Conjunctivae normal.  Cardiovascular:     Rate and Rhythm: Normal rate and regular rhythm.     Heart sounds: Murmur heard.  Pulmonary:     Effort: Pulmonary effort is normal. No respiratory distress.     Breath sounds: Normal breath sounds.  Abdominal:     Palpations: Abdomen is soft. There is no hepatomegaly or mass.     Tenderness: There is no abdominal tenderness.  Lymphadenopathy:     Cervical: No cervical adenopathy.  Skin:    General: Skin is warm.     Findings: No erythema or rash.  Neurological:     General: No focal deficit present.     Mental Status: She is alert and oriented to person, place, and time.     Cranial Nerves: No cranial nerve deficit.     Gait: Gait normal.  Psychiatric:        Mood and Affect: Affect normal. Mood is anxious.   ASSESSMENT AND PLAN:  [name] was seen today for follow-up.  Diagnoses and all orders for this visit:  Fatigue, unspecified type     Chronic but worse since COVID 19 infection 2 weeks ago. We discussed possible etiologies, some of her chronic medical conditions and medications can be contributing factors. Examination today does not suggest a serious process. I do not think blood work is needed at this time.  OSA (obstructive sleep apnea) Following with pulmonologist, she is not on CPAP, waiting for new mask. This can certainly be contributing to her fatigue.  Hypertension with heart disease BP adequately controlled. Metoprolol succinate 100 mg daily, Losartan 100 mg daily, and HCTZ 25 mg daily as well as low salt diet.  Anxiety disorder, unspecified type Some improvement with Sertraline 50 mg daily. At this time she is dealing with some stressors at home, mainly her son's health issues. Continue  CBT.  Return in about 3 months (around 08/31/2022).  Jil Penland G. [name], MD  [facility]. [place] office.

## 2022-06-01 DIAGNOSIS — E1140 Type 2 diabetes mellitus with diabetic neuropathy, unspecified: Secondary | ICD-10-CM

## 2022-06-01 DIAGNOSIS — F17210 Nicotine dependence, cigarettes, uncomplicated: Secondary | ICD-10-CM | POA: Diagnosis not present

## 2022-06-01 DIAGNOSIS — I2510 Atherosclerotic heart disease of native coronary artery without angina pectoris: Secondary | ICD-10-CM | POA: Diagnosis not present

## 2022-06-01 DIAGNOSIS — E785 Hyperlipidemia, unspecified: Secondary | ICD-10-CM | POA: Diagnosis not present

## 2022-06-01 DIAGNOSIS — I10 Essential (primary) hypertension: Secondary | ICD-10-CM | POA: Diagnosis not present

## 2022-06-01 DIAGNOSIS — J449 Chronic obstructive pulmonary disease, unspecified: Secondary | ICD-10-CM

## 2022-06-01 DIAGNOSIS — F32A Depression, unspecified: Secondary | ICD-10-CM | POA: Diagnosis not present

## 2022-06-01 DIAGNOSIS — R69 Illness, unspecified: Secondary | ICD-10-CM | POA: Diagnosis not present

## 2022-06-10 ENCOUNTER — Other Ambulatory Visit: Payer: Self-pay

## 2022-06-10 ENCOUNTER — Ambulatory Visit (HOSPITAL_COMMUNITY)
Admission: EM | Admit: 2022-06-10 | Discharge: 2022-06-10 | Discharge status: Against Medical Advice | Payer: Medicare HMO | Attending: Family Medicine | Admitting: Family Medicine

## 2022-06-10 ENCOUNTER — Emergency Department (HOSPITAL_BASED_OUTPATIENT_CLINIC_OR_DEPARTMENT_OTHER)
Admission: EM | Admit: 2022-06-10 | Discharge: 2022-06-11 | Disposition: A | Discharge status: Home / Self Care | Payer: Medicare HMO | Attending: Emergency Medicine | Admitting: Emergency Medicine

## 2022-06-10 ENCOUNTER — Emergency Department (HOSPITAL_BASED_OUTPATIENT_CLINIC_OR_DEPARTMENT_OTHER): Payer: Medicare HMO | Admitting: Radiology

## 2022-06-10 ENCOUNTER — Encounter (HOSPITAL_BASED_OUTPATIENT_CLINIC_OR_DEPARTMENT_OTHER): Payer: Self-pay | Admitting: Emergency Medicine

## 2022-06-10 DIAGNOSIS — Z1152 Encounter for screening for COVID-19: Secondary | ICD-10-CM | POA: Insufficient documentation

## 2022-06-10 DIAGNOSIS — J3489 Other specified disorders of nose and nasal sinuses: Secondary | ICD-10-CM | POA: Insufficient documentation

## 2022-06-10 DIAGNOSIS — R059 Cough, unspecified: Secondary | ICD-10-CM | POA: Diagnosis not present

## 2022-06-10 DIAGNOSIS — Z20822 Contact with and (suspected) exposure to covid-19: Secondary | ICD-10-CM | POA: Diagnosis not present

## 2022-06-10 DIAGNOSIS — Z7982 Long term (current) use of aspirin: Secondary | ICD-10-CM | POA: Insufficient documentation

## 2022-06-10 DIAGNOSIS — R9431 Abnormal electrocardiogram [ECG] [EKG]: Secondary | ICD-10-CM | POA: Diagnosis not present

## 2022-06-10 DIAGNOSIS — R5383 Other fatigue: Secondary | ICD-10-CM | POA: Diagnosis not present

## 2022-06-10 DIAGNOSIS — Z8616 Personal history of COVID-19: Secondary | ICD-10-CM | POA: Diagnosis not present

## 2022-06-10 DIAGNOSIS — J9811 Atelectasis: Secondary | ICD-10-CM | POA: Diagnosis not present

## 2022-06-10 LAB — RESP PANEL BY RT-PCR (FLU A&B, COVID) ARPGX2
Influenza A by PCR: NEGATIVE
Influenza B by PCR: NEGATIVE
SARS Coronavirus 2 by RT PCR: NEGATIVE

## 2022-06-10 LAB — COMPREHENSIVE METABOLIC PANEL
ALT: 10 U/L (ref 0–44)
AST: 16 U/L (ref 15–41)
Albumin: 3.8 g/dL (ref 3.5–5.0)
Alkaline Phosphatase: 42 U/L (ref 38–126)
Anion gap: 10 (ref 5–15)
BUN: 26 mg/dL — ABNORMAL HIGH (ref 8–23)
CO2: 26 mmol/L (ref 22–32)
Calcium: 9.5 mg/dL (ref 8.9–10.3)
Chloride: 104 mmol/L (ref 98–111)
Creatinine, Ser: 1.58 mg/dL — ABNORMAL HIGH (ref 0.44–1.00)
GFR, Estimated: 33 mL/min — ABNORMAL LOW (ref >60)
Glucose, Bld: 103 mg/dL — ABNORMAL HIGH (ref 70–99)
Potassium: 3.4 mmol/L — ABNORMAL LOW (ref 3.5–5.1)
Sodium: 140 mmol/L (ref 135–145)
Total Bilirubin: 0.5 mg/dL (ref 0.3–1.2)
Total Protein: 7.4 g/dL (ref 6.5–8.1)

## 2022-06-10 LAB — CBC WITH DIFFERENTIAL/PLATELET
Abs Immature Granulocytes: 0.06 10*3/uL (ref 0.00–0.07)
Basophils Absolute: 0.1 10*3/uL (ref 0.0–0.1)
Basophils Relative: 1 %
Eosinophils Absolute: 0.2 10*3/uL (ref 0.0–0.5)
Eosinophils Relative: 2 %
HCT: 40.4 % (ref 36.0–46.0)
Hemoglobin: 13.3 g/dL (ref 12.0–15.0)
Immature Granulocytes: 1 %
Lymphocytes Relative: 43 %
Lymphs Abs: 3.7 10*3/uL (ref 0.7–4.0)
MCH: 29.8 pg (ref 26.0–34.0)
MCHC: 32.9 g/dL (ref 30.0–36.0)
MCV: 90.6 fL (ref 80.0–100.0)
Monocytes Absolute: 0.8 10*3/uL (ref 0.1–1.0)
Monocytes Relative: 9 %
Neutro Abs: 3.8 10*3/uL (ref 1.7–7.7)
Neutrophils Relative %: 44 %
Platelets: 268 10*3/uL (ref 150–400)
RBC: 4.46 MIL/uL (ref 3.87–5.11)
RDW: 15.2 % (ref 11.5–15.5)
WBC: 8.6 10*3/uL (ref 4.0–10.5)
nRBC: 0 % (ref 0.0–0.2)

## 2022-06-10 MED ORDER — POTASSIUM CHLORIDE CRYS ER 20 MEQ PO TBCR
40.0000 meq | EXTENDED_RELEASE_TABLET | Freq: Once | ORAL | Status: AC
  Administered 2022-06-10: 40 meq via ORAL
  Filled 2022-06-10: qty 2

## 2022-06-10 MED ORDER — LACTATED RINGERS IV BOLUS
1000.0000 mL | Freq: Once | INTRAVENOUS | Status: AC
  Administered 2022-06-10: 1000 mL via INTRAVENOUS

## 2022-06-10 NOTE — ED Provider Notes (Signed)
[facility] EMERGENCY DEPT Provider Note   CSN: [id number] Arrival date & time: 06/10/22  1849     History  Chief Complaint  Patient presents with   Illness    [name] is a 76 y.o. female.  HPI 76 year old female presents with fatigue and generally not feeling well.  At first he tells me this has been for a week or so but later it seems like this has been going on since she had [place] in September.  She has rhinorrhea, fatigue and poor appetite.  She is eating and drinking though family at the bedside reports not as often as she should.  No fevers, shortness of breath, chest pain or vomiting.  1 time she had vomiting after a particularly rough episode of coughing.  The cough does seem a little worse than when she originally had COVID.  Also notes that she has sleep apnea and her CPAP did not fit her well so she is due to get this refitted.  She wonders if this lack of sleep and sleep apnea is causing some of her symptoms.  Home Medications Prior to Admission medications   Medication Sig Start Date End Date Taking? Authorizing Provider  acetaminophen (TYLENOL) 500 MG tablet Take 1,000 mg by mouth as needed for moderate pain or headache.     [provider]  albuterol (VENTOLIN HFA) 108 (90 Base) MCG/ACT inhaler INHALE ONE OR TWO PUFFS BY MOUTH INTO THE LUNGS EVERY SIX HOURS AS NEEDED FOR WHEEZING OR SHORTNESS OF BREATH 04/20/22   Young, Clinton D, MD  Alirocumab (PRALUENT) 75 MG/ML SOAJ Inject 75 mg into the skin every 14 (fourteen) days. 09/21/21   Fay Records, MD  aspirin 81 MG chewable tablet Chew 81 mg by mouth daily.    [provider]  Budeson-Glycopyrrol-Formoterol (BREZTRI AEROSPHERE) 160-9-4.8 MCG/ACT AERO Inhale 2 puffs into the lungs in the morning and at bedtime. 05/24/22   Deneise Lever, MD  budesonide-formoterol [address] [employer]) 80-4.5 MCG/ACT inhaler Inhale 2 puffs then rinse mouth, twice daily 04/20/22   Baird [name] D, MD  ezetimibe  (ZETIA) 10 MG tablet TAKE ONE TABLET BY MOUTH EVERY MORNING 05/27/22   Fay Records, MD  famotidine (PEPCID) 20 MG tablet TAKE ONE TABLET BY MOUTH EVERY MORNING and TAKE ONE TABLET BY MOUTH EVERYDAY AT BEDTIME 09/23/21   [name], [name], MD  fluticasone (FLONASE) 50 MCG/ACT nasal spray PLACE 2 SPRAYS INTO BOTH NOSTRILS AT BEDTIME AS NEEDED FOR ALLERGIES. 11/13/21   [name], [name], MD  gabapentin (NEURONTIN) 300 MG capsule Take 2 capsules (600 mg total) by mouth 2 (two) times daily. 05/13/21 08/06/22  Penumalli, Earlean Polka, MD  hydrochlorothiazide (HYDRODIURIL) 25 MG tablet TAKE ONE TABLET BY MOUTH ONCE DAILY 01/20/22   [name], [name], MD  losartan (COZAAR) 100 MG tablet TAKE ONE TABLET BY MOUTH EVERY MORNING 01/20/22   [name], [name], MD  metoprolol succinate (TOPROL-XL) 100 MG 24 hr tablet TAKE ONE TABLET BY MOUTH AT NOON 04/20/22   [name], [name], MD  montelukast (SINGULAIR) 10 MG tablet TAKE ONE TABLET BY MOUTH EVERYDAY AT BEDTIME 03/22/22   [name], [name], MD  MYRBETRIQ 50 MG TB24 tablet Take 50 mg by mouth daily. 10/29/21   [provider]  nitroGLYCERIN (NITROSTAT) 0.4 MG SL tablet Place 1 tablet (0.4 mg total) under the tongue every 5 (five) minutes as needed for chest pain. 05/26/21   Fay Records, MD  ondansetron (ZOFRAN-ODT) 4 MG disintegrating tablet '4mg'$   ODT q4 hours prn nausea/vomit 08/01/21   Deno Etienne, DO  polyethylene glycol powder (GLYCOLAX/MIRALAX) powder Take 17 g by mouth daily. 05/03/17   Levin Erp, PA  rosuvastatin (CRESTOR) 20 MG tablet Take 1 tablet (20 mg total) by mouth at bedtime. 04/20/22   Fay Records, MD  sertraline (ZOLOFT) 50 MG tablet Take 1 tablet (50 mg total) by mouth daily. 05/26/22   [name], [name], MD  traMADol (ULTRAM) 50 MG tablet Take 1 tablet (50 mg total) by mouth every 6 (six) hours as needed. 04/13/22 04/13/23  Erroll Luna, MD      Allergies    Oxycodone-acetaminophen, Percocet [oxycodone-acetaminophen], Pravastatin sodium,  Hydrocodone, and Aspirin    Review of Systems   Review of Systems  Constitutional:  Positive for fatigue. Negative for fever.  HENT:  Positive for rhinorrhea and sore throat (earlier in the week, none now).   Respiratory:  Positive for cough. Negative for shortness of breath.   Cardiovascular:  Negative for chest pain.  Gastrointestinal:  Negative for abdominal pain and vomiting.    Physical Exam Updated Vital Signs BP 99/61   Pulse 60   Temp 97.9 F (36.6 C)   Resp 17   SpO2 95%  Physical Exam Vitals and nursing note reviewed.  Constitutional:      Appearance: She is well-developed.  HENT:     Head: Normocephalic and atraumatic.  Cardiovascular:     Rate and Rhythm: Normal rate and regular rhythm.     Heart sounds: Normal heart sounds.  Pulmonary:     Effort: Pulmonary effort is normal.     Breath sounds: Normal breath sounds. No wheezing.  Abdominal:     General: There is no distension.     Palpations: Abdomen is soft.     Tenderness: There is no abdominal tenderness.  Skin:    General: Skin is warm and dry.  Neurological:     Mental Status: She is alert.     ED Results / Procedures / Treatments   Labs (all labs ordered are listed, but only abnormal results are displayed) Labs Reviewed  COMPREHENSIVE METABOLIC PANEL - Abnormal; Notable for the following components:      Result Value   Potassium 3.4 (*)    Glucose, Bld 103 (*)    BUN 26 (*)    Creatinine, Ser 1.58 (*)    GFR, Estimated 33 (*)    All other components within normal limits  RESP PANEL BY RT-PCR (FLU A&B, COVID) [id number]  CBC WITH DIFFERENTIAL/PLATELET    EKG EKG Interpretation  Date/Time:  Thursday June 10 2022 22:51:17 EST Ventricular Rate:  62 PR Interval:  166 QRS Duration: 94 QT Interval:  433 QTC Calculation: 440 R Axis:   -23 Text Interpretation: Sinus rhythm Probable left atrial enlargement Borderline left axis deviation Low voltage, precordial leads Confirmed by Sherwood Gambler [phone]) on 06/10/2022 10:54:58 PM  Radiology DG Chest 2 View  Result Date: 06/10/2022 CLINICAL DATA:  Upset stomach. EXAM: CHEST - 2 VIEW COMPARISON:  November 05, 2021 FINDINGS: The heart size and mediastinal contours are within normal limits. Mild, stable linear atelectasis is seen within the bilateral lung bases. There is no evidence of focal consolidation, pleural effusion or pneumothorax. A radiopaque fusion plate and screws are seen overlying the cervical spine. The visualized skeletal structures are otherwise unremarkable. IMPRESSION: Mild bibasilar linear atelectasis. Electronically Signed   By: Virgina Norfolk M.D.   On: 06/10/2022 22:36    Procedures Procedures  Medications Ordered in ED Medications  potassium chloride SA (KLOR-CON M) CR tablet 40 mEq (has no administration in time range)  lactated ringers bolus 1,000 mL (1,000 mLs Intravenous New Bag/Given 06/10/22 2303)    ED Course/ Medical Decision Making/ A&P                           Medical Decision Making Amount and/or Complexity of Data Reviewed Labs: ordered. Radiology: ordered.  Risk Prescription drug management.   No pneumonia seen on chest x-ray.  I personally viewed/interpreted these images.  COVID/flu testing negative.  No leukocytosis.  Slight hypokalemia but I doubt this is causing her symptoms.  At this point, unclear why she has been having these prolonged fatigue and I discussed that she needs to address the sleep apnea/CPAP and see if this helps with her fatigue.  Otherwise I doubt acute infection, especially bacterial infection.  She was given IV fluids.  We will have her follow-up with her PCP and she will be given return precautions.        Final Clinical Impression(s) / ED Diagnoses Final diagnoses:  Fatigue, unspecified type    Rx / DC Orders ED Discharge Orders     None         Sherwood Gambler, MD 06/10/22 2342

## 2022-06-10 NOTE — ED Triage Notes (Signed)
Upset stomach without n/v/d. Sinus, runny nose,  "I just feel crappy, its been all week" Recent covid 1 month ago

## 2022-06-10 NOTE — Discharge Instructions (Addendum)
If you develop severe headache, fever, vomiting, weakness on one side of your body, new or worsening weakness, or any other new/concerning symptoms and return to the ER for evaluation.

## 2022-06-14 ENCOUNTER — Emergency Department (HOSPITAL_COMMUNITY): Payer: Medicare HMO

## 2022-06-14 ENCOUNTER — Ambulatory Visit: Payer: Medicare HMO | Admitting: Psychologist

## 2022-06-14 ENCOUNTER — Other Ambulatory Visit: Payer: Self-pay

## 2022-06-14 ENCOUNTER — Emergency Department (HOSPITAL_COMMUNITY)
Admission: EM | Admit: 2022-06-14 | Discharge: 2022-06-14 | Disposition: A | Discharge status: Home / Self Care | Payer: Medicare HMO | Attending: Emergency Medicine | Admitting: Emergency Medicine

## 2022-06-14 DIAGNOSIS — Z79899 Other long term (current) drug therapy: Secondary | ICD-10-CM | POA: Insufficient documentation

## 2022-06-14 DIAGNOSIS — Z7982 Long term (current) use of aspirin: Secondary | ICD-10-CM | POA: Insufficient documentation

## 2022-06-14 DIAGNOSIS — R0602 Shortness of breath: Secondary | ICD-10-CM | POA: Diagnosis not present

## 2022-06-14 DIAGNOSIS — R0789 Other chest pain: Secondary | ICD-10-CM | POA: Insufficient documentation

## 2022-06-14 DIAGNOSIS — I2510 Atherosclerotic heart disease of native coronary artery without angina pectoris: Secondary | ICD-10-CM | POA: Insufficient documentation

## 2022-06-14 DIAGNOSIS — I129 Hypertensive chronic kidney disease with stage 1 through stage 4 chronic kidney disease, or unspecified chronic kidney disease: Secondary | ICD-10-CM | POA: Insufficient documentation

## 2022-06-14 DIAGNOSIS — J449 Chronic obstructive pulmonary disease, unspecified: Secondary | ICD-10-CM | POA: Diagnosis not present

## 2022-06-14 DIAGNOSIS — Z7951 Long term (current) use of inhaled steroids: Secondary | ICD-10-CM | POA: Insufficient documentation

## 2022-06-14 DIAGNOSIS — R079 Chest pain, unspecified: Secondary | ICD-10-CM | POA: Diagnosis not present

## 2022-06-14 DIAGNOSIS — N189 Chronic kidney disease, unspecified: Secondary | ICD-10-CM | POA: Diagnosis not present

## 2022-06-14 DIAGNOSIS — R7989 Other specified abnormal findings of blood chemistry: Secondary | ICD-10-CM | POA: Diagnosis not present

## 2022-06-14 LAB — CBC WITH DIFFERENTIAL/PLATELET
Abs Immature Granulocytes: 0.06 10*3/uL (ref 0.00–0.07)
Basophils Absolute: 0.1 10*3/uL (ref 0.0–0.1)
Basophils Relative: 1 %
Eosinophils Absolute: 0.3 10*3/uL (ref 0.0–0.5)
Eosinophils Relative: 3 %
HCT: 42.8 % (ref 36.0–46.0)
Hemoglobin: 13.8 g/dL (ref 12.0–15.0)
Immature Granulocytes: 1 %
Lymphocytes Relative: 40 %
Lymphs Abs: 3.3 10*3/uL (ref 0.7–4.0)
MCH: 29.7 pg (ref 26.0–34.0)
MCHC: 32.2 g/dL (ref 30.0–36.0)
MCV: 92.2 fL (ref 80.0–100.0)
Monocytes Absolute: 0.7 10*3/uL (ref 0.1–1.0)
Monocytes Relative: 8 %
Neutro Abs: 3.8 10*3/uL (ref 1.7–7.7)
Neutrophils Relative %: 47 %
Platelets: 267 10*3/uL (ref 150–400)
RBC: 4.64 MIL/uL (ref 3.87–5.11)
RDW: 14.9 % (ref 11.5–15.5)
WBC: 8.2 10*3/uL (ref 4.0–10.5)
nRBC: 0 % (ref 0.0–0.2)

## 2022-06-14 LAB — TROPONIN I (HIGH SENSITIVITY)
Troponin I (High Sensitivity): 5 ng/L (ref <18)
Troponin I (High Sensitivity): 4 ng/L (ref <18)

## 2022-06-14 LAB — D-DIMER, QUANTITATIVE: D-Dimer, Quant: 1.28 ug/mL-FEU — ABNORMAL HIGH (ref 0.00–0.50)

## 2022-06-14 LAB — BASIC METABOLIC PANEL
Anion gap: 10 (ref 5–15)
BUN: 20 mg/dL (ref 8–23)
CO2: 27 mmol/L (ref 22–32)
Calcium: 9.6 mg/dL (ref 8.9–10.3)
Chloride: 102 mmol/L (ref 98–111)
Creatinine, Ser: 1.56 mg/dL — ABNORMAL HIGH (ref 0.44–1.00)
GFR, Estimated: 34 mL/min — ABNORMAL LOW (ref >60)
Glucose, Bld: 110 mg/dL — ABNORMAL HIGH (ref 70–99)
Potassium: 3.6 mmol/L (ref 3.5–5.1)
Sodium: 139 mmol/L (ref 135–145)

## 2022-06-14 MED ORDER — TECHNETIUM TO 99M ALBUMIN AGGREGATED
4.1000 | Freq: Once | INTRAVENOUS | Status: AC | PRN
  Administered 2022-06-14: 4.1 via INTRAVENOUS

## 2022-06-14 NOTE — ED Provider Triage Note (Signed)
Emergency Medicine Provider Triage Evaluation Note  [name] , a 76 y.o. female  was evaluated in triage.  Pt complains of CP. Started yesterday. Intermittent in nature. Associated SOB. No coug  Hx of CAD, s/p stent 2018, numbness, weakness. No LE swelling, pain. No HX of PE, DVT. No PND, orthopnea.  Review of Systems  Positive: Cp, sob Negative: Fever, le swelling  Physical Exam  Ht '5\' 6"'$  (1.676 m)   Wt 97.5 kg   BMI 34.70 kg/m  Gen:   Awake, no distress   Resp:  Normal effort  MSK:   Moves extremities without difficulty  Other:    Medical Decision Making  Medically screening exam initiated at 9:37 AM.  Appropriate orders placed.  [name] was informed that the remainder of the evaluation will be completed by another provider, this initial triage assessment does not replace that evaluation, and the importance of remaining in the ED until their evaluation is complete.  CP, sob   [name] A, PA-C 06/14/22 1410

## 2022-06-14 NOTE — ED Triage Notes (Signed)
Pt. Stated, I started having chest pain with SOB  with arm pain left. I was finishing up dinner and just got hot and clammy.

## 2022-06-14 NOTE — ED Provider Notes (Signed)
[facility] EMERGENCY DEPARTMENT Provider Note   CSN: [id number] Arrival date & time: 06/14/22  0909     History {Add pertinent medical, surgical, social history, OB history to HPI:1} Chief Complaint  Patient presents with   Chest Pain   Shortness of Breath   Arm Pain    [name] is a 76 y.o. female.   Chest Pain Associated symptoms: shortness of breath   Shortness of Breath Associated symptoms: chest pain   Arm Pain Associated symptoms include chest pain and shortness of breath.       Home Medications Prior to Admission medications   Medication Sig Start Date End Date Taking? Authorizing Provider  acetaminophen (TYLENOL) 500 MG tablet Take 1,000 mg by mouth as needed for moderate pain or headache.     [provider]  albuterol (VENTOLIN HFA) 108 (90 Base) MCG/ACT inhaler INHALE ONE OR TWO PUFFS BY MOUTH INTO THE LUNGS EVERY SIX HOURS AS NEEDED FOR WHEEZING OR SHORTNESS OF BREATH 04/20/22   Young, Clinton D, MD  Alirocumab (PRALUENT) 75 MG/ML SOAJ Inject 75 mg into the skin every 14 (fourteen) days. 09/21/21   Fay Records, MD  aspirin 81 MG chewable tablet Chew 81 mg by mouth daily.    [provider]  Budeson-Glycopyrrol-Formoterol (BREZTRI AEROSPHERE) 160-9-4.8 MCG/ACT AERO Inhale 2 puffs into the lungs in the morning and at bedtime. 05/24/22   Deneise Lever, MD  budesonide-formoterol [facility]) 80-4.5 MCG/ACT inhaler Inhale 2 puffs then rinse mouth, twice daily 04/20/22   Baird [name] D, MD  ezetimibe (ZETIA) 10 MG tablet TAKE ONE TABLET BY MOUTH EVERY MORNING 05/27/22   Fay Records, MD  famotidine (PEPCID) 20 MG tablet TAKE ONE TABLET BY MOUTH EVERY MORNING and TAKE ONE TABLET BY MOUTH EVERYDAY AT BEDTIME 09/23/21   [name], [name], MD  fluticasone (FLONASE) 50 MCG/ACT nasal spray PLACE 2 SPRAYS INTO BOTH NOSTRILS AT BEDTIME AS NEEDED FOR ALLERGIES. 11/13/21   [name], [name], MD  gabapentin (NEURONTIN) 300 MG capsule Take 2  capsules (600 mg total) by mouth 2 (two) times daily. 05/13/21 08/06/22  Penumalli, Earlean Polka, MD  hydrochlorothiazide (HYDRODIURIL) 25 MG tablet TAKE ONE TABLET BY MOUTH ONCE DAILY 01/20/22   [name], [name], MD  losartan (COZAAR) 100 MG tablet TAKE ONE TABLET BY MOUTH EVERY MORNING 01/20/22   [name], [name], MD  metoprolol succinate (TOPROL-XL) 100 MG 24 hr tablet TAKE ONE TABLET BY MOUTH AT NOON 04/20/22   [name], [name], MD  montelukast (SINGULAIR) 10 MG tablet TAKE ONE TABLET BY MOUTH EVERYDAY AT BEDTIME 03/22/22   [name], [name], MD  MYRBETRIQ 50 MG TB24 tablet Take 50 mg by mouth daily. 10/29/21   [provider]  nitroGLYCERIN (NITROSTAT) 0.4 MG SL tablet Place 1 tablet (0.4 mg total) under the tongue every 5 (five) minutes as needed for chest pain. 05/26/21   Fay Records, MD  ondansetron (ZOFRAN-ODT) 4 MG disintegrating tablet '4mg'$  ODT q4 hours prn nausea/vomit 08/01/21   Deno Etienne, DO  polyethylene glycol powder (GLYCOLAX/MIRALAX) powder Take 17 g by mouth daily. 05/03/17   Levin Erp, PA  rosuvastatin (CRESTOR) 20 MG tablet Take 1 tablet (20 mg total) by mouth at bedtime. 04/20/22   Fay Records, MD  sertraline (ZOLOFT) 50 MG tablet Take 1 tablet (50 mg total) by mouth daily. 05/26/22   [name], [name], MD  traMADol (ULTRAM) 50 MG tablet Take 1 tablet (50 mg total) by mouth  every 6 (six) hours as needed. 04/13/22 04/13/23  Erroll Luna, MD      Allergies    Oxycodone-acetaminophen, Percocet [oxycodone-acetaminophen], Pravastatin sodium, Hydrocodone, and Aspirin    Review of Systems   Review of Systems  Respiratory:  Positive for shortness of breath.   Cardiovascular:  Positive for chest pain.    Physical Exam Updated Vital Signs BP 129/71 (BP Location: Right Arm)   Pulse (!) 59   Temp 98.1 F (36.7 C) (Oral)   Resp 17   Ht '5\' 6"'$  (1.676 m)   Wt 97.5 kg   SpO2 96%   BMI 34.70 kg/m  Physical Exam  ED Results / Procedures / Treatments   Labs (all labs  ordered are listed, but only abnormal results are displayed) Labs Reviewed  BASIC METABOLIC PANEL - Abnormal; Notable for the following components:      Result Value   Glucose, Bld 110 (*)    Creatinine, Ser 1.56 (*)    GFR, Estimated 34 (*)    All other components within normal limits  CBC WITH DIFFERENTIAL/PLATELET  TROPONIN I (HIGH SENSITIVITY)  TROPONIN I (HIGH SENSITIVITY)    EKG None  Radiology DG Chest 2 View  Result Date: 06/14/2022 CLINICAL DATA:  Chest pain with shortness of breath and left arm pain. EXAM: CHEST - 2 VIEW COMPARISON:  Chest radiograph 06/10/2022 FINDINGS: The cardiomediastinal silhouette is stable and within normal limits. Linear opacity in the left mid lung likely reflects atelectasis or scar, unchanged. There is no other focal airspace disease. There is no pulmonary edema. There is no pleural effusion or pneumothorax There is no acute osseous abnormality. Cervical spine fusion hardware is again noted. IMPRESSION: Stable chest with no radiographic evidence of acute cardiopulmonary process. Electronically Signed   By: Valetta Mole M.D.   On: 06/14/2022 10:01    Procedures Procedures  {Document cardiac monitor, telemetry assessment procedure when appropriate:1}  Medications Ordered in ED Medications - No data to display  ED Course/ Medical Decision Making/ A&P                           Medical Decision Making  ***  {Document critical care time when appropriate:1} {Document review of labs and clinical decision tools ie heart score, Chads2Vasc2 etc:1}  {Document your independent review of radiology images, and any outside records:1} {Document your discussion with family members, caretakers, and with consultants:1} {Document social determinants of health affecting pt's care:1} {Document your decision making why or why not admission, treatments were needed:1} Final Clinical Impression(s) / ED Diagnoses Final diagnoses:  None    Rx / DC Orders ED  Discharge Orders     None

## 2022-06-14 NOTE — Discharge Instructions (Signed)
Your laboratory and imaging work-up today was reassuring with no evidence for blood clot in your lungs.  You had negative cardiac enzymes x2.  I spoke with on-call cardiology regarding your symptoms and presentation who recommended outpatient follow-up and this will be scheduled.  Return to the emergency department for any recurrent episodes of chest pain radiating to your left shoulder, left arm, left jaw with associated pressure & sweatiness.

## 2022-06-15 NOTE — Progress Notes (Deleted)
Office Visit    Patient Name: [name] Date of Encounter: 06/15/2022  Primary Care Provider:  [name], [name], MD Primary Cardiologist:  Dorris Carnes, MD Primary Electrophysiologist: None  Chief Complaint    [name] is a 76 y.o. female with PMH of CAD s/p DES to LCx 2018 and mild to moderate nonobstructive CAD and other vessels, HTN, HLD (with myalgias), DM, CKD stage III with prior renal CA s/p right nephrectomy, RA, chronic edema chronic OSA, neuropathy who presents today for follow-up of coronary artery disease.   Past Medical History    Past Medical History:  Diagnosis Date   Allergy    Anxiety    C. difficile diarrhea    history of   CAD (coronary artery disease), native coronary artery    a. 05/2017 showed 100% mLCx tx with DES, otherwise 20% mRCA, 50% ramus, 30% dLAD, 20% prox-mid LAD, 20% mLAD treated medically.    Cancer [facility])    Right kidney CA removed.    CKD (chronic kidney disease) stage 3, GFR 30-59 ml/min (HCC) 10/11/2016   s/p R nephrectomy   Colon polyps 2008   HYPERPLASTIC   COPD (chronic obstructive pulmonary disease) ([place])    former smoker   Gastritis    GERD (gastroesophageal reflux disease)    History of echocardiogram    Echo 3/18:  Moderate LVH, EF 00-17, grade 1 diastolic dysfunction, calcified aortic valve, mild MR, moderate LAE   History of nuclear stress test    Myoview 3/18: Mod size and intensity fixed septal defect, may be artifact.Opposite mod size and intensity lat defect, which is reversible and could represent ischemia or possibly artifact (SDS 4). LVEF 71% with normal wall motion. Intermediate risk study. >> images reviewed with Dr. Dorris Carnes - no sig ischemia; med rx    Hx of cardiovascular stress test    Lexiscan Myoview 6/16:  EF 70%, no scar or ischemia; Low Risk   Hyperlipidemia    Hypertension    Neuropathy    Orthostatic hypotension 05/28/2017   OSA (obstructive sleep apnea) 01/16/2021   Osteoarthritis    Pre-diabetes     no meds   Rheumatoid arthritis (HCC)    RA (Dr. Ouida Sills) Bilateral hands   S/P angioplasty with stent 05/27/17 to LCX with DES  05/28/2017   Sleep apnea 2023   does not use CPAP   Thyroid disease    Past Surgical History:  Procedure Laterality Date   ABDOMINAL HYSTERECTOMY  1978   ANTERIOR CERVICAL DECOMP/DISCECTOMY FUSION N/A 05/29/2018   Procedure: ANTERIOR CERVICAL DECOMPRESSION FUSION - CERVICAL FIVE-CERVICAL SIX - CERVICAL SIX-CERVICAL SEVEN;  Surgeon: Earnie Larsson, MD;  Location: [facility];  Service: Neurosurgery;  Laterality: N/A;   BACK SURGERY     x 2   BREAST LUMPECTOMY WITH RADIOACTIVE SEED LOCALIZATION Right 04/13/2022   Procedure: RIGHT BREAST SEED LUMPECTOMY;  Surgeon: Erroll Luna, MD;  Location: [facility];  Service: General;  Laterality: Right;   CARPAL TUNNEL RELEASE Left    COLONOSCOPY W/ BIOPSIES AND POLYPECTOMY     Hx: of   CORONARY STENT INTERVENTION N/A 05/27/2017   Procedure: CORONARY STENT INTERVENTION;  Surgeon: Burnell Blanks, MD;  Location: [place] CV LAB;  Service: Cardiovascular;  Laterality: N/A;   ESOPHAGOGASTRODUODENOSCOPY     HNP     LEFT HEART CATH AND CORONARY ANGIOGRAPHY N/A 05/27/2017   Procedure: LEFT HEART CATH AND CORONARY ANGIOGRAPHY;  Surgeon: Burnell Blanks, MD;  Location:  [place] INVASIVE CV LAB;  Service: Cardiovascular;  Laterality: N/A;   LUMBAR LAMINECTOMY/DECOMPRESSION MICRODISCECTOMY Left 02/12/2013   Procedure: LUMBAR TWO THREE, LUMBAR THREE FOUR, LUMBAR FOUR FIVE  LAMINECTOMY/DECOMPRESSION MICRODISCECTOMY 3 LEVELS;  Surgeon: Charlie Pitter, MD;  Location: [place] NEURO ORS;  Service: Neurosurgery;  Laterality: Left;   NEPHRECTOMY Right 2010   10.rcc cancer   TOTAL KNEE ARTHROPLASTY Right    Redo    Allergies  Allergies  Allergen Reactions   Oxycodone-Acetaminophen Hives and Itching    hallucinations   Percocet [Oxycodone-Acetaminophen] Hives, Itching and Other (See Comments)    hallucinations    Pravastatin Sodium Other (See Comments)    Muscle aches Muscle aches   Hydrocodone Other (See Comments)    "crazy dreams"   Aspirin Other (See Comments)    GI upset Other reaction(s): Other (See Comments) REACTION: GI upset    History of Present Illness    [name] is a 76 year old female with the above-mentioned past medical history who presents today for follow-up of coronary artery disease.  Patient normal Myoview in 2016 and subsequent testing 2019 revealed lateral defect with significant ischemia. CT showed + FFR and LHC was performed in 05/2017 that revealed 100% and LCx that was treated with DES x1 and 20% mRCA, 50% ramus, 30% dLAD, 20% prox-mid LAD, 20% mLAD treated medically. Echo at that time showed mild LVH, EF 65-70%, grade 1 DD.  She was seen in the ED at [facility] on 08/2020 for complaint of chest pain.  Troponins were negative x2 and EKG without acute changes.  2D echo was completed that showed normal LV function and patient underwent Lexiscan nuclear study that revealed no evidence of ischemia.  Patient is noted to have GI history of esophageal strictures. She was last seen by Dr. Harrington Challenger on 06/2021 for routine follow-up.  Patient was doing okay at that time and was being fitted for CPAP.  She denies chest pain and was attempting to get back into water aerobics.  No medication changes were made during this visit.  She was seen by me in follow-up 01/2022 and reported that she quit smoking.  She was congratulated and reported that she was being refitted for a new CPAP mask.  She had no chest pain complaints and blood pressure was well controlled during visit.  She was seen in the ED on 11/13 with complaint of shortness of breath and chest pain.  She also endorsed left arm pain.  Troponins were flat and dimer was elevated.  Perfusion scan showed no evidence of embolism  Since last being seen in the office patient reports***.  Patient denies chest pain, palpitations, dyspnea,  PND, orthopnea, nausea, vomiting, dizziness, syncope, edema, weight gain, or early satiety.  ***Notes: -Last ischemic evaluation was by Lexiscan in 2022 complaint of chest pain that was low risk. -Was elevated at 1.56 Home Medications    Current Outpatient Medications  Medication Sig Dispense Refill   acetaminophen (TYLENOL) 500 MG tablet Take 1,000 mg by mouth as needed for moderate pain or headache.      albuterol (VENTOLIN HFA) 108 (90 Base) MCG/ACT inhaler INHALE ONE OR TWO PUFFS BY MOUTH INTO THE LUNGS EVERY SIX HOURS AS NEEDED FOR WHEEZING OR SHORTNESS OF BREATH 8.5 g 12   Alirocumab (PRALUENT) 75 MG/ML SOAJ Inject 75 mg into the skin every 14 (fourteen) days. 2 mL 11   aspirin 81 MG chewable tablet Chew 81 mg by mouth daily.     Budeson-Glycopyrrol-Formoterol (  BREZTRI AEROSPHERE) 160-9-4.8 MCG/ACT AERO Inhale 2 puffs into the lungs in the morning and at bedtime. 2 each 0   budesonide-formoterol (SYMBICORT) 80-4.5 MCG/ACT inhaler Inhale 2 puffs then rinse mouth, twice daily 1 each 12   ezetimibe (ZETIA) 10 MG tablet TAKE ONE TABLET BY MOUTH EVERY MORNING 90 tablet 2   famotidine (PEPCID) 20 MG tablet TAKE ONE TABLET BY MOUTH EVERY MORNING and TAKE ONE TABLET BY MOUTH EVERYDAY AT BEDTIME 180 tablet 2   fluticasone (FLONASE) 50 MCG/ACT nasal spray PLACE 2 SPRAYS INTO BOTH NOSTRILS AT BEDTIME AS NEEDED FOR ALLERGIES. 48 mL 1   gabapentin (NEURONTIN) 300 MG capsule Take 2 capsules (600 mg total) by mouth 2 (two) times daily. 360 capsule 4   hydrochlorothiazide (HYDRODIURIL) 25 MG tablet TAKE ONE TABLET BY MOUTH ONCE DAILY 90 tablet 2   losartan (COZAAR) 100 MG tablet TAKE ONE TABLET BY MOUTH EVERY MORNING 90 tablet 2   metoprolol succinate (TOPROL-XL) 100 MG 24 hr tablet TAKE ONE TABLET BY MOUTH AT NOON 90 tablet 1   montelukast (SINGULAIR) 10 MG tablet TAKE ONE TABLET BY MOUTH EVERYDAY AT BEDTIME 30 tablet 3   MYRBETRIQ 50 MG TB24 tablet Take 50 mg by mouth daily.     nitroGLYCERIN  (NITROSTAT) 0.4 MG SL tablet Place 1 tablet (0.4 mg total) under the tongue every 5 (five) minutes as needed for chest pain. 25 tablet 2   ondansetron (ZOFRAN-ODT) 4 MG disintegrating tablet '4mg'$  ODT q4 hours prn nausea/vomit 20 tablet 0   polyethylene glycol powder (GLYCOLAX/MIRALAX) powder Take 17 g by mouth daily. 500 g 3   rosuvastatin (CRESTOR) 20 MG tablet Take 1 tablet (20 mg total) by mouth at bedtime. 90 tablet 3   sertraline (ZOLOFT) 50 MG tablet Take 1 tablet (50 mg total) by mouth daily. 90 tablet 1   traMADol (ULTRAM) 50 MG tablet Take 1 tablet (50 mg total) by mouth every 6 (six) hours as needed. 20 tablet 0   No current facility-administered medications for this visit.     Review of Systems  Please see the history of present illness.    (+)*** (+)***  All other systems reviewed and are otherwise negative except as noted above.  Physical Exam    Wt Readings from Last 3 Encounters:  06/14/22 215 lb (97.5 kg)  05/31/22 233 lb (105.7 kg)  04/30/22 220 lb (99.8 kg)   AS:TMHDQ were no vitals filed for this visit.,There is no height or weight on file to calculate BMI.  Constitutional:      Appearance: Healthy appearance. Not in distress.  Neck:     Vascular: JVD normal.  Pulmonary:     Effort: Pulmonary effort is normal.     Breath sounds: No wheezing. No rales. Diminished in the bases Cardiovascular:     Normal rate. Regular rhythm. Normal S1. Normal S2.      Murmurs: There is no murmur.  Edema:    Peripheral edema absent.  Abdominal:     Palpations: Abdomen is soft non tender. There is no hepatomegaly.  Skin:    General: Skin is warm and dry.  Neurological:     General: No focal deficit present.     Mental Status: Alert and oriented to person, place and time.     Cranial Nerves: Cranial nerves are intact.  EKG/LABS/Other Studies Reviewed    ECG personally reviewed by me today - ***  Risk Assessment/Calculations:   {Does this patient have ATRIAL  FIBRILLATION?:[id number]}  Lab Results  Component Value Date   WBC 8.2 06/14/2022   HGB 13.8 06/14/2022   HCT 42.8 06/14/2022   MCV 92.2 06/14/2022   PLT 267 06/14/2022   Lab Results  Component Value Date   CREATININE 1.56 (H) 06/14/2022   BUN 20 06/14/2022   NA 139 06/14/2022   K 3.6 06/14/2022   CL 102 06/14/2022   CO2 27 06/14/2022   Lab Results  Component Value Date   ALT 10 06/10/2022   AST 16 06/10/2022   ALKPHOS 42 06/10/2022   BILITOT 0.5 06/10/2022   Lab Results  Component Value Date   CHOL 143 09/11/2020   HDL 56 09/11/2020   LDLCALC 69 09/11/2020   LDLDIRECT 171.5 12/06/2011   TRIG 98 09/11/2020   CHOLHDL 2.6 09/11/2020    Lab Results  Component Value Date   HGBA1C 6.3 03/24/2022    Assessment & Plan    1.  Coronary artery disease: -s/p DES to LCx 2018 and mild to moderate nonobstructive CAD -Last 2D echo completed 2022 with LVH, EF 65-70%, grade 1 DD. -Patient reports no complaints of angina or acute shortness of breath -Continue GDMT with ASA 81 mg, Crestor 20 mg, and Zetia 10 mg daily   2.Hypertension: -Blood pressure today is well controlled at 130/70 -Continue Cozaar 100 mg daily   3.  Hyperlipidemia: -Patient's last LDL was 69 on 09/2020 and reports no myalgias with current regimen -Continue Zetia and Crestor as noted above   4.  Peripheral artery disease:     -Patient reports no complaints of claudication and states that she has recently quit smoking in December.      Disposition: Follow-up with Dorris Carnes, MD or APP in *** months {Are you ordering a CV Procedure (e.g. stress test, cath, DCCV, TEE, etc)?   Press F2        :[id number]}   Medication Adjustments/Labs and Tests Ordered: Current medicines are reviewed at length with the patient today.  Concerns regarding medicines are outlined above.   Signed, Mable Fill, Marissa Nestle, NP 06/15/2022, 7:47 PM [facility]  Note:  This document was prepared  using Dragon voice recognition software and may include unintentional dictation errors.

## 2022-06-16 ENCOUNTER — Other Ambulatory Visit: Payer: Self-pay | Admitting: Family Medicine

## 2022-06-16 DIAGNOSIS — J3089 Other allergic rhinitis: Secondary | ICD-10-CM

## 2022-06-17 ENCOUNTER — Ambulatory Visit: Payer: Medicare HMO | Admitting: Nurse Practitioner

## 2022-06-17 ENCOUNTER — Telehealth: Payer: Self-pay | Admitting: Pharmacist

## 2022-06-17 ENCOUNTER — Telehealth: Payer: Self-pay

## 2022-06-17 DIAGNOSIS — R072 Precordial pain: Secondary | ICD-10-CM

## 2022-06-17 NOTE — Chronic Care Management (AMB) (Unsigned)
Chronic Care Management Pharmacy Assistant   Name: [name]  MRN: [id number] DOB: August 02, 1946  Reason for Encounter: Medication Review / Medication Coordination Call   Recent office visits:  05/31/2022 [name] MD - Patient was seen for fatigue and additional concerns. No medication changes. Follow up in 3 months.   Recent consult visits:  None  Hospital visits:  Patient was seen at [facility] ED on 06/14/2022 due to atypical chest pain and an additional concern.  New?Medications Started at [facility] Discharge:?? No medication started Medication Changes at Hospital Discharge: No medication changes Medications Discontinued at Hospital Discharge: No medications discontinued Medications that remain the same after Hospital Discharge:??  -All other medications will remain the same.    Patient was seen at [facility] ED on 06/10/2022 (3 hours) due to fatigue.   New?Medications Started at [facility] Discharge:?? No medications started Medication Changes at Hospital Discharge: No medication changes Medications Discontinued at Hospital Discharge: No medications discontinued Medications that remain the same after Hospital Discharge:??  -All other medications will remain the same.   Patient was seen at [facility] on 06/10/2022 (3 minutes) Patient left without being seen.   Medications: Outpatient Encounter Medications as of 06/17/2022  Medication Sig   acetaminophen (TYLENOL) 500 MG tablet Take 1,000 mg by mouth as needed for moderate pain or headache.    albuterol (VENTOLIN HFA) 108 (90 Base) MCG/ACT inhaler INHALE ONE OR TWO PUFFS BY MOUTH INTO THE LUNGS EVERY SIX HOURS AS NEEDED FOR WHEEZING OR SHORTNESS OF BREATH   Alirocumab (PRALUENT) 75 MG/ML SOAJ Inject 75 mg into the skin every 14 (fourteen) days.   aspirin 81 MG chewable tablet Chew 81 mg by mouth daily.   Budeson-Glycopyrrol-Formoterol (BREZTRI AEROSPHERE) 160-9-4.8 MCG/ACT AERO Inhale 2 puffs  into the lungs in the morning and at bedtime.   budesonide-formoterol (SYMBICORT) 80-4.5 MCG/ACT inhaler Inhale 2 puffs then rinse mouth, twice daily   ezetimibe (ZETIA) 10 MG tablet TAKE ONE TABLET BY MOUTH EVERY MORNING   famotidine (PEPCID) 20 MG tablet TAKE ONE TABLET BY MOUTH EVERY MORNING and TAKE ONE TABLET BY MOUTH EVERYDAY AT BEDTIME   fluticasone (FLONASE) 50 MCG/ACT nasal spray PLACE 2 SPRAYS INTO BOTH NOSTRILS AT BEDTIME AS NEEDED FOR ALLERGIES.   gabapentin (NEURONTIN) 300 MG capsule Take 2 capsules (600 mg total) by mouth 2 (two) times daily.   hydrochlorothiazide (HYDRODIURIL) 25 MG tablet TAKE ONE TABLET BY MOUTH ONCE DAILY   losartan (COZAAR) 100 MG tablet TAKE ONE TABLET BY MOUTH EVERY MORNING   metoprolol succinate (TOPROL-XL) 100 MG 24 hr tablet TAKE ONE TABLET BY MOUTH AT NOON   montelukast (SINGULAIR) 10 MG tablet TAKE ONE TABLET BY MOUTH EVERYDAY AT BEDTIME   MYRBETRIQ 50 MG TB24 tablet Take 50 mg by mouth daily.   nitroGLYCERIN (NITROSTAT) 0.4 MG SL tablet Place 1 tablet (0.4 mg total) under the tongue every 5 (five) minutes as needed for chest pain.   ondansetron (ZOFRAN-ODT) 4 MG disintegrating tablet '4mg'$  ODT q4 hours prn nausea/vomit   polyethylene glycol powder (GLYCOLAX/MIRALAX) powder Take 17 g by mouth daily.   rosuvastatin (CRESTOR) 20 MG tablet Take 1 tablet (20 mg total) by mouth at bedtime.   sertraline (ZOLOFT) 50 MG tablet Take 1 tablet (50 mg total) by mouth daily.   traMADol (ULTRAM) 50 MG tablet Take 1 tablet (50 mg total) by mouth every 6 (six) hours as needed.   No facility-administered encounter medications on file as of 06/17/2022.  Reviewed chart for medication changes ahead of medication coordination call.  BP Readings from Last 3 Encounters:  06/14/22 (!) 135/95  06/11/22 130/72  05/31/22 128/82    Lab Results  Component Value Date   HGBA1C 6.3 03/24/2022     Patient obtains medications through Adherence Packaging  30 Days    Last  adherence delivery included:  Praluent Inj 75 mg - inject 75 mg every 14 days Gabapentin 300 mg - take 2 tablets at lunch and 2 tablets at bedtime Ezetimibe 10 mg  - take one tablet at breakfast Famotidine 20 mg - take one tablet at breakfast and one tablet at bedtime Rosuvastatin 20 mg - take one tablet at bedtime Metoprolol Suc 100 mg - take one tablet at lunch Losartan 100 mg - take one tablet at breakfast HCTZ 25 mg - take one tablet at breakfast Myrbetriq 50 mg - take one tablet at breakfast Sertraline 50 mg - take one tablet at lunch   Patient declined the following medications:  No mediations declined   Patient is due for next adherence delivery on: 07/01/2022   Called patient and reviewed medications and coordinated delivery.  SCHED FU This delivery to include: Praluent Inj 75 mg - inject 75 mg every 14 days Gabapentin 300 mg - take 2 tablets at lunch and 2 tablets at bedtime****Does patient need this if so add to order Ezetimibe 10 mg  - take one tablet at breakfast Famotidine 20 mg - take one tablet at breakfast and one tablet at bedtime Rosuvastatin 20 mg - take one tablet at bedtime Metoprolol Suc 100 mg - take one tablet at lunch Losartan 100 mg - take one tablet at breakfast HCTZ 25 mg - take one tablet at breakfast Myrbetriq 50 mg - take one tablet at breakfast Sertraline 50 mg - take one tablet at lunch   Patient will need a short fill: No short fills needed   Coordinated acute fill: No acute fills needed  Patient declined the following medications:   Confirmed delivery date of 07/01/2022, advised patient that pharmacy will contact them the morning of delivery.   Care Gaps: AWV - scheduled 08/30/2022 Last BP - 135/95 on 06/14/2022 Last A1C - 6.3 on 03/24/2022 Covid - overdue Urine ACR - overdue Eye exam - overdue Tdap - postponed Shingrix - postponed  Star Rating Drugs: Losartan 100 mg - last filled 05/31/2022 30 DS at [facility] Rosuvastatin 20 mg -  last filled 05/31/2022 30 DS at [facility] [phone]

## 2022-06-17 NOTE — Patient Outreach (Signed)
  Care Coordination TOC Note Transition Care Management Unsuccessful Follow-up Telephone Call  Date of discharge and from where:  06/14/22-[place]  Attempts:  1st Attempt  Reason for unsuccessful TCM follow-up call:  No answer/busy   Enzo Montgomery, RN,BSN,CCM [facility] Telephonic Care Management Coordinator Direct Phone: [phone] Toll Free: [phone] Fax: [phone]

## 2022-06-18 ENCOUNTER — Telehealth: Payer: Self-pay

## 2022-06-18 DIAGNOSIS — G4733 Obstructive sleep apnea (adult) (pediatric): Secondary | ICD-10-CM | POA: Diagnosis not present

## 2022-06-18 NOTE — Telephone Encounter (Signed)
     Patient  visit on 11/13  at [facility]   Have you been able to follow up with your primary care physician? Yes   The patient was or was not able to obtain any needed medicine or equipment. Yes   Are there diet recommendations that you are having difficulty following? Na   Patient expresses understanding of discharge instructions and education provided has no other needs at this time.  Yes      [place], [facility], Care Management  [phone] [address], [place], [place] [address] Phone: [phone] Email: [email or web address]

## 2022-06-18 NOTE — Patient Outreach (Signed)
  Care Coordination TOC Note Transition Care Management Unsuccessful Follow-up Telephone Call  Date of discharge and from where:  06/14/22-[facility] [place]   Attempts:  2nd Attempt  Reason for unsuccessful TCM follow-up call:  Unable to reach patient   [facility] Telephonic Care Management Coordinator Direct Phone: [phone] Toll Free: [phone] Fax: [phone]'

## 2022-06-20 ENCOUNTER — Other Ambulatory Visit: Payer: Self-pay | Admitting: Family Medicine

## 2022-06-21 ENCOUNTER — Telehealth: Payer: Self-pay

## 2022-06-21 NOTE — Patient Outreach (Signed)
  Care Coordination TOC Note Transition Care Management Unsuccessful Follow-up Telephone Call  Date of discharge and from where:  06/14/22-[place]  Attempts:  3rd Attempt  Reason for unsuccessful TCM follow-up call:  Unable to reach patient  Enzo Montgomery, RN,BSN,CCM [place] [facility] Telephonic Care Management Coordinator Direct Phone: [phone] Toll Free: [phone] Fax: [phone]

## 2022-06-29 ENCOUNTER — Ambulatory Visit: Payer: Medicare HMO | Admitting: Psychologist

## 2022-06-29 DIAGNOSIS — F331 Major depressive disorder, recurrent, moderate: Secondary | ICD-10-CM

## 2022-06-29 DIAGNOSIS — R69 Illness, unspecified: Secondary | ICD-10-CM | POA: Diagnosis not present

## 2022-06-29 NOTE — Progress Notes (Signed)
[place] Counselor/Therapist Progress Note  Patient ID: [name], MRN: [id number],    Date: 06/29/2022  Time Spent: 08:05 am to 08:48 am; total time: 43 minutes   This session was held via in person. The patient consented to in-person therapy and was in the clinician's office. Limits of confidentiality were discussed with the patient.   Treatment Type: Individual Therapy  Reported Symptoms: Less depression  Mental Status Exam: Appearance:  Well Groomed     Behavior: Appropriate  Motor: Normal  Speech/Language:  Clear and Coherent  Affect: Appropriate  Mood: normal  Thought process: normal  Thought content:   WNL  Sensory/Perceptual disturbances:   WNL  Orientation: oriented to person, place, and time/date  Attention: Good  Concentration: Good  Memory: WNL  Fund of knowledge:  Good  Insight:   Fair  Judgment:  Good  Impulse Control: Good   Risk Assessment: Danger to Self:  No Self-injurious Behavior: No Danger to Others: No Duty to Warn:no Physical Aggression / Violence:No  Access to Firearms a concern: No  Gang Involvement:No   Subjective: Beginning the session, patient stated that she is doing better as evidenced by baking more and being more active. She acknowledged having some days that are more challenging from others. Continuing to talk, she primarily focused on barriers to attending church. As part of this, two different themes were identified including church and giving back to the community. Patient spent time processing ways to give back without being part of the church. She also reflected on values related to attending the church. She was agreeable to homework and following up. She denied suicidal and homicidal ideation.    Interventions:  Worked on developing a therapeutic relationship with the patient using active listening and reflective statements. Provided emotional support using empathy and validation. Reviewed the treatment plan with  the patient. Praised the patient for doing better and explored what has assisted the patient. Reflected on how patient has taken steps moving forward. Used a Product/process development scientist to assist the patient. Identified goals for the session. Explored the barriers to attending church. Processed patient's values to assist the patient. Explored different options related to church. Identified the theme of giving back to the community. Explored ways to give back without necessarily being part of the church. Used socratic questions to assist the patient. Processed thoughts and emotions. Reflected on patient's growth. Provided empathic statements. Assigned homework Assessed for suicidal and homicidal ideation.   Homework: Contact friend about ways to give back to the community; consider attending church or looking for other churches.   Next Session: Review homework and emotional support.   Diagnosis: F33.1 major depressive affective disorder, recurrent, moderate  Plan:   Goals Alleviate depressive symptoms Recognize, accept, and cope with depressive feelings Develop healthy thinking patterns Develop healthy interpersonal relationships  Objectives target date for all objectives is 04/13/2023 Cooperate with a medication evaluation by a physician Verbalize an accurate understanding of depression Verbalize an understanding of the treatment Identify and replace thoughts that support depression Learn and implement behavioral strategies Verbalize an understanding and resolution of current interpersonal problems Learn and implement problem solving and decision making skills Learn and implement conflict resolution skills to resolve interpersonal problems Verbalize an understanding of healthy and unhealthy emotions verbalize insight into how past relationships may be influence current experiences with depression Use mindfulness and acceptance strategies and increase value based behavior  Increase hopeful statements about the  future.  Interventions Consistent with treatment model, discuss how change in  cognitive, behavioral, and interpersonal can help client alleviate depression CBT Behavioral activation help the client explore the relationship, nature of the dispute,  Help the client develop new interpersonal skills and relationships Conduct Problem so living therapy Teach conflict resolution skills Use a process-experiential approach Conduct TLDP Conduct ACT Evaluate need for psychotropic medication Monitor adherence to medication   The patient and clinician reviewed the treatment plan on 04/26/2022. The patient approved of the treatment plan.   [name], PsyD

## 2022-06-29 NOTE — Progress Notes (Signed)
07/05/2022 [name] [id number] 09/16/1945  Referring provider: [name], [name], MD Primary GI doctor: Dr. Cathleen Corti (Dr. Ardis Hughs)  ASSESSMENT AND PLAN:   Loose stools worse since COVID Check pancreatic elastase with fecal fat since worse after food. Given FODMAP diet, discussed foods. No alarm symptoms, due for colonoscopy 01/2023, will follow-up. If symptoms have not improved at follow-up visit can consider colonoscopy and endoscopy.  Gastroesophageal reflux disease without esophagitis worse since COVID Check labs, questionable COVID induced gastroparesis/dyspepsia. Patient with history of mild chronic gastritis, added on pantoprazole, emphasize 30 minutes before food. Given gastroparesis diet and information. Schedule for follow-up, if this is not helping consider endoscopic evaluation, last EGD 01/2020.  PERSONAL HX COLONIC POLYPS Due 01/2023 for personal history of adenomatous polyps.  Coronary artery disease involving native coronary artery of native heart without angina pectoris On Plavix, would need to hold prior to any endoscopic evaluation.    History of Present Illness:  76 y.o. female  with a past medical history of anxiety, CAD status post stent in 2018 on Plavix (08/31/2020 echo with LVEF normal at 60-65% and no aortic stenosis) , history of C. difficile colitis, personal history of adenomatous polyps, reflux and others listed below, returns to clinic today for evaluation of diarrhea.   04/18/2015 colonoscopy Dr. Deatra Ina with sessile polyp in the ascending colon.  Pathology showed tubular adenoma and repeat was recommended in 5 years.   02/05/2020 EGD for GERD and dysphagia with gastritis and otherwise normal.   Biopsies showed mild chronic gastritis.  02/05/2020 colonoscopy with 5 polyps and diverticulosis in the left colon, biopsy showed tubular adenomas and repeat recommended in 3 years. 01/2023  10/27/2021 office visit with Ellouise Newer for loose stools and  intermittent abdominal discomfort associated with red meat. 06/10/2022 ER visit for generally not feeling well, had COVID September 2023, had decreased p.o. intake, had hypokalemia, given fluids and potassium. 06/14/2022 went to the ER with atypical chest pain troponin negative x2, CBC without leukocytosis or anemia, BMP stable CKD.   Perfusion lung scan per positive D-dimer negative.  She was taking miralax for constipation however since having COVID in sept after she eats,  10-15 mins she will have loose stools.  States has bad odor.  She gets full quickly, AB bloating, some nausea but no vomiting.  Has nausea worse in the morning when she first gets up.  Can have cough with white phelhm and gaggin in the morning. She states if she drinks quickly to take her medications she will get caught and regurgitate it up.  No painful swallowing. She has not had any further chest pain.  Denies weight loss since COVID.  No melena, no hematochezia.  Denies NSAIDS, no ETOH. No smoking She is on pepcid twice a day.   Wt Readings from Last 3 Encounters:  07/05/22 231 lb (104.8 kg)  06/14/22 215 lb (97.5 kg)  05/31/22 233 lb (105.7 kg)    She  reports that she quit smoking about 9 months ago. Her smoking use included cigarettes. She started smoking about 53 years ago. She has a 3.00 pack-year smoking history. She has never used smokeless tobacco. She reports that she does not drink alcohol and does not use drugs. Her family history includes Cancer in her brother; Dementia in her brother and brother; Diabetes in her brother and brother; Heart disease in her father; Kidney disease in her son; Parkinsonism in her brother; Prostate cancer in her father; Rheum arthritis in her mother; Stroke  in her mother.   Current Medications:    Current Outpatient Medications (Cardiovascular):    Alirocumab (PRALUENT) 75 MG/ML SOAJ, Inject 75 mg into the skin every 14 (fourteen) days.   ezetimibe (ZETIA) 10 MG tablet,  TAKE ONE TABLET BY MOUTH EVERY MORNING   hydrochlorothiazide (HYDRODIURIL) 25 MG tablet, TAKE ONE TABLET BY MOUTH ONCE DAILY   losartan (COZAAR) 100 MG tablet, TAKE ONE TABLET BY MOUTH EVERY MORNING   metoprolol succinate (TOPROL-XL) 100 MG 24 hr tablet, TAKE ONE TABLET BY MOUTH AT NOON   nitroGLYCERIN (NITROSTAT) 0.4 MG SL tablet, Place 1 tablet (0.4 mg total) under the tongue every 5 (five) minutes as needed for chest pain.   rosuvastatin (CRESTOR) 20 MG tablet, Take 1 tablet (20 mg total) by mouth at bedtime.  Current Outpatient Medications (Respiratory):    albuterol (VENTOLIN HFA) 108 (90 Base) MCG/ACT inhaler, INHALE ONE OR TWO PUFFS BY MOUTH INTO THE LUNGS EVERY SIX HOURS AS NEEDED FOR WHEEZING OR SHORTNESS OF BREATH   Budeson-Glycopyrrol-Formoterol (BREZTRI AEROSPHERE) 160-9-4.8 MCG/ACT AERO, Inhale 2 puffs into the lungs in the morning and at bedtime.   budesonide-formoterol (SYMBICORT) 80-4.5 MCG/ACT inhaler, Inhale 2 puffs then rinse mouth, twice daily   fluticasone (FLONASE) 50 MCG/ACT nasal spray, PLACE 2 SPRAYS INTO BOTH NOSTRILS AT BEDTIME AS NEEDED FOR ALLERGIES.   montelukast (SINGULAIR) 10 MG tablet, TAKE ONE TABLET BY MOUTH EVERYDAY AT BEDTIME  Current Outpatient Medications (Analgesics):    acetaminophen (TYLENOL) 500 MG tablet, Take 1,000 mg by mouth as needed for moderate pain or headache.    aspirin 81 MG chewable tablet, Chew 81 mg by mouth daily.   traMADol (ULTRAM) 50 MG tablet, Take 1 tablet (50 mg total) by mouth every 6 (six) hours as needed.   Current Outpatient Medications (Other):    famotidine (PEPCID) 20 MG tablet, TAKE ONE TABLET BY MOUTH EVERY MORNING and TAKE ONE TABLET BY MOUTH EVERYDAY AT BEDTIME   gabapentin (NEURONTIN) 300 MG capsule, Take 2 capsules (600 mg total) by mouth 2 (two) times daily.   MYRBETRIQ 50 MG TB24 tablet, Take 50 mg by mouth daily.   ondansetron (ZOFRAN-ODT) 4 MG disintegrating tablet, '4mg'$  ODT q4 hours prn nausea/vomit    pantoprazole (PROTONIX) 40 MG tablet, Take 1 tablet (40 mg total) by mouth daily.   polyethylene glycol powder (GLYCOLAX/MIRALAX) powder, Take 17 g by mouth daily.   sertraline (ZOLOFT) 50 MG tablet, Take 1 tablet (50 mg total) by mouth daily.  Surgical History:  She  has a past surgical history that includes Abdominal hysterectomy (1978); Total knee arthroplasty (Right); HNP; Nephrectomy (Right, 2010); Back surgery; Colonoscopy w/ biopsies and polypectomy; Lumbar laminectomy/decompression microdiscectomy (Left, 02/12/2013); Carpal tunnel release (Left); LEFT HEART CATH AND CORONARY ANGIOGRAPHY (N/A, 05/27/2017); CORONARY STENT INTERVENTION (N/A, 05/27/2017); Esophagogastroduodenoscopy; Anterior cervical decomp/discectomy fusion (N/A, 05/29/2018); and Breast lumpectomy with radioactive seed localization (Right, 04/13/2022).  Current Medications, Allergies, Past Medical History, Past Surgical History, Family History and Social History were reviewed in [employer] record.  Physical Exam: BP 118/78   Pulse 78   Wt 231 lb (104.8 kg)   SpO2 98%   BMI 37.28 kg/m  General:   Pleasant, well developed female in no acute distress Heart : Regular rate and rhythm; no murmurs Pulm: Clear anteriorly; no wheezing Abdomen:  Soft, Obese AB, Active bowel sounds. No tenderness . Without guarding and Without rebound, No organomegaly appreciated. Rectal: declines Extremities:  without  edema. Neurologic:  Alert and  oriented x4;  No focal deficits.  Psych:  Cooperative. Normal mood and affect.   Vladimir Crofts, PA-C 07/05/22

## 2022-07-05 ENCOUNTER — Encounter: Payer: Self-pay | Admitting: Physician Assistant

## 2022-07-05 ENCOUNTER — Ambulatory Visit: Payer: Medicare HMO | Admitting: Physician Assistant

## 2022-07-05 ENCOUNTER — Other Ambulatory Visit (INDEPENDENT_AMBULATORY_CARE_PROVIDER_SITE_OTHER): Payer: Medicare HMO

## 2022-07-05 VITALS — BP 118/78 | HR 78 | Wt 231.0 lb

## 2022-07-05 DIAGNOSIS — R195 Other fecal abnormalities: Secondary | ICD-10-CM

## 2022-07-05 DIAGNOSIS — K219 Gastro-esophageal reflux disease without esophagitis: Secondary | ICD-10-CM

## 2022-07-05 DIAGNOSIS — I2510 Atherosclerotic heart disease of native coronary artery without angina pectoris: Secondary | ICD-10-CM | POA: Diagnosis not present

## 2022-07-05 DIAGNOSIS — Z860100 Personal history of colon polyps, unspecified: Secondary | ICD-10-CM

## 2022-07-05 LAB — CBC WITH DIFFERENTIAL/PLATELET
Basophils Absolute: 0.1 10*3/uL (ref 0.0–0.1)
Basophils Relative: 1.7 % (ref 0.0–3.0)
Eosinophils Absolute: 0.3 10*3/uL (ref 0.0–0.7)
Eosinophils Relative: 4.3 % (ref 0.0–5.0)
HCT: 42.9 % (ref 36.0–46.0)
Hemoglobin: 14.5 g/dL (ref 12.0–15.0)
Lymphocytes Relative: 39.6 % (ref 12.0–46.0)
Lymphs Abs: 3.1 10*3/uL (ref 0.7–4.0)
MCHC: 33.9 g/dL (ref 30.0–36.0)
MCV: 90 fl (ref 78.0–100.0)
Monocytes Absolute: 0.6 10*3/uL (ref 0.1–1.0)
Monocytes Relative: 8.1 % (ref 3.0–12.0)
Neutro Abs: 3.7 10*3/uL (ref 1.4–7.7)
Neutrophils Relative %: 46.3 % (ref 43.0–77.0)
Platelets: 250 10*3/uL (ref 150.0–400.0)
RBC: 4.77 Mil/uL (ref 3.87–5.11)
RDW: 15.5 % (ref 11.5–15.5)
WBC: 7.9 10*3/uL (ref 4.0–10.5)

## 2022-07-05 LAB — COMPREHENSIVE METABOLIC PANEL
ALT: 11 U/L (ref 0–35)
AST: 17 U/L (ref 0–37)
Albumin: 4.1 g/dL (ref 3.5–5.2)
Alkaline Phosphatase: 55 U/L (ref 39–117)
BUN: 19 mg/dL (ref 6–23)
CO2: 29 mEq/L (ref 19–32)
Calcium: 9.6 mg/dL (ref 8.4–10.5)
Chloride: 102 mEq/L (ref 96–112)
Creatinine, Ser: 1.65 mg/dL — ABNORMAL HIGH (ref 0.40–1.20)
GFR: 29.6 mL/min — ABNORMAL LOW (ref >60.00)
Glucose, Bld: 118 mg/dL — ABNORMAL HIGH (ref 70–99)
Potassium: 3.6 mEq/L (ref 3.5–5.1)
Sodium: 139 mEq/L (ref 135–145)
Total Bilirubin: 0.5 mg/dL (ref 0.2–1.2)
Total Protein: 7.8 g/dL (ref 6.0–8.3)

## 2022-07-05 LAB — TSH: TSH: 2.16 u[IU]/mL (ref 0.35–5.50)

## 2022-07-05 MED ORDER — PANTOPRAZOLE SODIUM 40 MG PO TBEC: 40.0000 mg | DELAYED_RELEASE_TABLET | Freq: Every day | ORAL | 3 refills | Status: DC

## 2022-07-05 NOTE — Patient Instructions (Addendum)
You have been scheduled for a follow up appointment with Dr Marlynn Perking on 08/20/22 at 2:00 pm.  Your provider has requested that you go to the basement level for lab work before leaving today. Press "B" on the elevator. The lab is located at the first door on the left as you exit the elevator.   Please take your proton pump inhibitor medication, pantoprazole 40 mg once a day for 2-3 months, can continue pepcid  Please take this medication 30 minutes to 1 hour before meals- this makes it more effective.  Avoid spicy and acidic foods Avoid fatty foods Limit your intake of coffee, tea, alcohol, and carbonated drinks Work to maintain a healthy weight Keep the head of the bed elevated at least 3 inches with blocks or a wedge pillow if you are having any nighttime symptoms Stay upright for 2 hours after eating Avoid meals and snacks three to four hours before bedtime  GREAT JOB ON STOPPING SMOKING!!   Gastroparesis- can be from COVID Please do small frequent meals like 4-6 meals a day.  Eat and drink liquids at separate times.  Avoid high fiber foods, cook your vegetables, avoid high fat food.  Suggest spreading protein throughout the day (greek yogurt, glucerna, soft meat, milk, eggs) Choose soft foods that you can mash with a fork When you are more symptomatic, change to pureed foods foods and liquids.  Consider reading "Living well with Gastroparesis" by Lambert Keto Gastroparesis is a condition in which food takes longer than normal to empty from the stomach. This condition is also known as delayed gastric emptying. It is usually a long-term (chronic) condition. There is no cure, but there are treatments and things that you can do at home to help relieve symptoms. Treating the underlying condition that causes gastroparesis can also help relieve symptoms What are the causes? In many cases, the cause of this condition is not known. Possible causes include: A hormone (endocrine)  disorder, such as hypothyroidism or diabetes. A nervous system disease, such as Parkinson's disease or multiple sclerosis. Cancer, infection, or surgery that affects the stomach or vagus nerve. The vagus nerve runs from your chest, through your neck, and to the lower part of your brain. A connective tissue disorder, such as scleroderma. Certain medicines. What increases the risk? You are more likely to develop this condition if: You have certain disorders or diseases. These may include: An endocrine disorder. An eating disorder. Amyloidosis. Scleroderma. Parkinson's disease. Multiple sclerosis. Cancer or infection of the stomach or the vagus nerve. You have had surgery on your stomach or vagus nerve. You take certain medicines. You are female. What are the signs or symptoms? Symptoms of this condition include: Feeling full after eating very little or a loss of appetite. Nausea, vomiting, or heartburn. Bloating of your abdomen. Inconsistent blood sugar (glucose) levels on blood tests. Unexplained weight loss. Acid from the stomach coming up into the esophagus (gastroesophageal reflux). Sudden tightening (spasm) of the stomach, which can be painful. Symptoms may come and go. Some people may not notice any symptoms. How is this diagnosed? This condition is diagnosed with tests, such as: Tests that check how long it takes food to move through the stomach and intestines. These tests include: Upper gastrointestinal (GI) series. For this test, you drink a liquid that shows up well on X-rays, and then X-rays are taken of your intestines. Gastric emptying scintigraphy. For this test, you eat food that contains a small amount of radioactive material, and then scans  are taken. Wireless capsule GI monitoring system. For this test, you swallow a pill (capsule) that records information about how foods and fluid move through your stomach. Gastric manometry. For this test, a tube is passed down  your throat and into your stomach to measure electrical and muscular activity. Endoscopy. For this test, a long, thin tube with a camera and light on the end is passed down your throat and into your stomach to check for problems in your stomach lining. Ultrasound. This test uses sound waves to create images of the inside of your body. This can help rule out gallbladder disease or pancreatitis as a cause of your symptoms. How is this treated? There is no cure for this condition, but treatment and home care may relieve symptoms. Treatment may include: Treating the underlying cause. Managing your symptoms by making changes to your diet and exercise habits. Taking medicines to control nausea and vomiting and to stimulate stomach muscles. Getting food through a feeding tube in the hospital. This may be done in severe cases. Having surgery to insert a device called a gastric electrical stimulator into your body. This device helps improve stomach emptying and control nausea and vomiting. Follow these instructions at home: Take over-the-counter and prescription medicines only as told by your health care provider. Follow instructions from your health care provider about eating or drinking restrictions. Your health care provider may recommend that you: Eat smaller meals more often. Eat low-fat foods. Eat low-fiber forms of high-fiber foods. For example, eat cooked vegetables instead of raw vegetables. Have only liquid foods instead of solid foods. Liquid foods are easier to digest. Drink enough fluid to keep your urine pale yellow. Exercise as often as told by your health care provider. Keep all follow-up visits. This is important. Contact a health care provider if you: Notice that your symptoms do not improve with treatment. Have new symptoms. Get help right away if you: Have severe pain in your abdomen that does not improve with treatment. Have nausea that is severe or does not go away. Vomit every  time you drink fluids. Summary Gastroparesis is a long-term (chronic) condition in which food takes longer than normal to empty from the stomach. Symptoms include nausea, vomiting, heartburn, bloating of your abdomen, and loss of appetite. Eating smaller portions, low-fat foods, and low-fiber forms of high-fiber foods may help you manage your symptoms. Get help right away if you have severe pain in your abdomen. This information is not intended to replace advice given to you by your health care provider. Make sure you discuss any questions you have with your health care provider. Document Revised: 11/26/2019 Document Reviewed: 11/26/2019 Elsevier Patient Education  2021 [place] may have POST INFECTIOUS IBS OR IRRITABLE BOWEL After an infection or diverticulitis flare your intestines can spasm or be a little bit more sensitive. Try these things below:  Can do BRAT diet versus low FODMAP- see below Try trial off milk/lactose products.  Add fiber like benefiber or citracel once a day Can do trial of IBGard for AB pain EVERY DAY- Take 1-2 capsules once a day for maintence or twice a day during a flare Can take dicyclomine as needed.  if any worsening symptoms like blood in stool, weight loss, please call the office or go to the ER.    FODMAP stands for fermentable oligo-, di-, mono-saccharides and polyols (1). These are the scientific terms used to classify groups of carbs that are notorious for triggering digestive symptoms like  bloating, gas and stomach pain.      Diverticulosis Diverticulosis is a condition that develops when small pouches (diverticula) form in the wall of the large intestine (colon). The colon is where water is absorbed and stool (feces) is formed. The pouches form when the inside layer of the colon pushes through weak spots in the outer layers of the colon. You may have a few pouches or many of them. The pouches usually do not cause problems unless they  become inflamed or infected. When this happens, the condition is called diverticulitis- this is left lower quadrant pain, diarrhea, fever, chills, nausea or vomiting.  If this occurs please call the office or go to the hospital. Sometimes these patches without inflammation can also have painless bleeding associated with them, if this happens please call the office or go to the hospital. Preventing constipation and increasing fiber can help reduce diverticula and prevent complications. Even if you feel you have a high-fiber diet, suggest getting on Benefiber or Cirtracel 2 times daily.

## 2022-07-12 ENCOUNTER — Encounter: Payer: Self-pay | Admitting: Diagnostic Neuroimaging

## 2022-07-12 ENCOUNTER — Ambulatory Visit: Payer: Medicare HMO | Admitting: Diagnostic Neuroimaging

## 2022-07-12 VITALS — BP 98/51 | HR 68 | Ht 66.0 in | Wt 229.2 lb

## 2022-07-12 DIAGNOSIS — G6289 Other specified polyneuropathies: Secondary | ICD-10-CM | POA: Diagnosis not present

## 2022-07-12 NOTE — Progress Notes (Signed)
Agree with the assessment and plan as outlined by Amanda Collier, PA-C. ? ?Tomoko Sandra, DO, FACG ? ?

## 2022-07-12 NOTE — Progress Notes (Signed)
[facility]  PATIENT: [name] DOB: 1945/08/13  REFERRING CLINICIAN: [name], [name], MD  HISTORY FROM: patient  REASON FOR VISIT: follow up   HISTORICAL  CHIEF COMPLAINT:  Chief Complaint  Patient presents with   Room 7    Pt is here for Follow Up on Neuropathy. Pt states no new changes. Pt states that the Neuropathy is in her hands and feet. Pt states that its and burning sensation.     HISTORY OF PRESENT ILLNESS:   UPDATE (07/12/22, VRP): Since last visit, doing about the same. Out of gabapentin x 1 month, and not much changes. Mild pain issues. Overall stable.    UPDATE (02/16/21, VRP): Since last visit, patient reports onset of headache, dizziness, photophobia and pressure since spring 2021.  She was treated for sinus issues around that time and symptoms improved.  Then symptoms returned a few months later.  Now having almost daily dizzy lightheaded sensations, mild headaches, gait and balance difficulties.  She is under significant stress, tends to worry and feel anxious, sleeping only 4 to 5 hours of sleep at night.  Also recently had home sleep study which shows severe sleep apnea, pending follow-up and treatment.  Patient went to ER for evaluation and testing was unremarkable.  Also went to ENT for evaluation and testing was unremarkable.  UPDATE (11/14/17, VRP): Since last visit, doing worse with pain in left arm and left leg. Tolerating gabapentin '400mg'$  . No alleviating or aggravating factors.   UPDATE (09/19/17, MM): She states overall she is doing well.  She does have burning and tingling in the left arm that is worse at night.  She also reports she occasionally has burning in the feet.  She states the compounded cream and gabapentin has been beneficial.  She did see hand surgery for carpal tunnel but he did not feel that it was severe enough to complete surgery per the patient.  The patient also reports that she is been having neck pain.  She did see her  primary care who felt that it was muscle spasms and placed on tizanidine.  She returns today for evaluation.  UPDATE 03/16/17: Since last visit, doing a little better with neuropathy cream and gabapentin. Sxs stable. Planning to get second opinion [facility] rheumatology clinic.    PRIOR HPI (12/01/16): 76 year old female with history of rheumatoid arthritis, here for evaluation of left hand numbness and pain. Patient reports numbness in bilateral hands and feet, especially left hand for past 6 months. She describes pain and tenderness in her left fifth digit radiating up to her left elbow. Patient denies any recent accidents or traumas. Patient has history of diabetes, hypertension, renal cell carcinoma, rheumatoid arthritis, currently on leflunomide. Patient presented for EMG nerve conduction study on 10/14/16 which showed diffuse widespread axonal polyneuropathy as well as superimposed left carpal tunnel syndrome. Patient has history of right carpal tunnel syndrome status post surgery with good results.    REVIEW OF SYSTEMS: Full 14 system review of systems performed and negative with exception of: as per HPI.   ALLERGIES: Allergies  Allergen Reactions   Oxycodone-Acetaminophen Hives and Itching    hallucinations   Percocet [Oxycodone-Acetaminophen] Hives, Itching and Other (See Comments)    hallucinations   Pravastatin Sodium Other (See Comments)    Muscle aches Muscle aches   Hydrocodone Other (See Comments)    "crazy dreams"   Aspirin Other (See Comments)    GI upset Other reaction(s): Other (See Comments) REACTION: GI upset  HOME MEDICATIONS: Outpatient Medications Prior to Visit  Medication Sig Dispense Refill   acetaminophen (TYLENOL) 500 MG tablet Take 1,000 mg by mouth as needed for moderate pain or headache.      albuterol (VENTOLIN HFA) 108 (90 Base) MCG/ACT inhaler INHALE ONE OR TWO PUFFS BY MOUTH INTO THE LUNGS EVERY SIX HOURS AS NEEDED FOR WHEEZING OR SHORTNESS OF BREATH 8.5 g  12   Alirocumab (PRALUENT) 75 MG/ML SOAJ Inject 75 mg into the skin every 14 (fourteen) days. 2 mL 11   aspirin 81 MG chewable tablet Chew 81 mg by mouth daily.     Budeson-Glycopyrrol-Formoterol (BREZTRI AEROSPHERE) 160-9-4.8 MCG/ACT AERO Inhale 2 puffs into the lungs in the morning and at bedtime. 2 each 0   budesonide-formoterol (SYMBICORT) 80-4.5 MCG/ACT inhaler Inhale 2 puffs then rinse mouth, twice daily 1 each 12   ezetimibe (ZETIA) 10 MG tablet TAKE ONE TABLET BY MOUTH EVERY MORNING 90 tablet 2   famotidine (PEPCID) 20 MG tablet TAKE ONE TABLET BY MOUTH EVERY MORNING and TAKE ONE TABLET BY MOUTH EVERYDAY AT BEDTIME 180 tablet 2   fluticasone (FLONASE) 50 MCG/ACT nasal spray PLACE 2 SPRAYS INTO BOTH NOSTRILS AT BEDTIME AS NEEDED FOR ALLERGIES. 48 mL 1   hydrochlorothiazide (HYDRODIURIL) 25 MG tablet TAKE ONE TABLET BY MOUTH ONCE DAILY 90 tablet 2   losartan (COZAAR) 100 MG tablet TAKE ONE TABLET BY MOUTH EVERY MORNING 90 tablet 2   metoprolol succinate (TOPROL-XL) 100 MG 24 hr tablet TAKE ONE TABLET BY MOUTH AT NOON 90 tablet 1   montelukast (SINGULAIR) 10 MG tablet TAKE ONE TABLET BY MOUTH EVERYDAY AT BEDTIME 30 tablet 3   MYRBETRIQ 50 MG TB24 tablet Take 50 mg by mouth daily.     nitroGLYCERIN (NITROSTAT) 0.4 MG SL tablet Place 1 tablet (0.4 mg total) under the tongue every 5 (five) minutes as needed for chest pain. 25 tablet 2   ondansetron (ZOFRAN-ODT) 4 MG disintegrating tablet '4mg'$  ODT q4 hours prn nausea/vomit 20 tablet 0   pantoprazole (PROTONIX) 40 MG tablet Take 1 tablet (40 mg total) by mouth daily. 30 tablet 3   polyethylene glycol powder (GLYCOLAX/MIRALAX) powder Take 17 g by mouth daily. 500 g 3   rosuvastatin (CRESTOR) 20 MG tablet Take 1 tablet (20 mg total) by mouth at bedtime. 90 tablet 3   sertraline (ZOLOFT) 50 MG tablet Take 1 tablet (50 mg total) by mouth daily. 90 tablet 1   traMADol (ULTRAM) 50 MG tablet Take 1 tablet (50 mg total) by mouth every 6 (six) hours as  needed. 20 tablet 0   gabapentin (NEURONTIN) 300 MG capsule Take 2 capsules (600 mg total) by mouth 2 (two) times daily. 360 capsule 4   No facility-administered medications prior to visit.    PAST MEDICAL HISTORY: Past Medical History:  Diagnosis Date   Allergy    Anxiety    C. difficile diarrhea    history of   CAD (coronary artery disease), native coronary artery    a. 05/2017 showed 100% mLCx tx with DES, otherwise 20% mRCA, 50% ramus, 30% dLAD, 20% prox-mid LAD, 20% mLAD treated medically.    Cancer [facility])    Right kidney CA removed.    CKD (chronic kidney disease) stage 3, GFR 30-59 ml/min (HCC) 10/11/2016   s/p R nephrectomy   Colon polyps 2008   HYPERPLASTIC   COPD (chronic obstructive pulmonary disease) (HCC)    former smoker   Gastritis    GERD (gastroesophageal reflux disease)  History of echocardiogram    Echo 3/18:  Moderate LVH, EF 24-40, grade 1 diastolic dysfunction, calcified aortic valve, mild MR, moderate LAE   History of nuclear stress test    Myoview 3/18: Mod size and intensity fixed septal defect, may be artifact.Opposite mod size and intensity lat defect, which is reversible and could represent ischemia or possibly artifact (SDS 4). LVEF 71% with normal wall motion. Intermediate risk study. >> images reviewed with Dr. Dorris Carnes - no sig ischemia; med rx    Hx of cardiovascular stress test    Lexiscan Myoview 6/16:  EF 70%, no scar or ischemia; Low Risk   Hyperlipidemia    Hypertension    Neuropathy    Orthostatic hypotension 05/28/2017   OSA (obstructive sleep apnea) 01/16/2021   Osteoarthritis    Pre-diabetes    no meds   Rheumatoid arthritis (HCC)    RA (Dr. Ouida Sills) Bilateral hands   S/P angioplasty with stent 05/27/17 to LCX with DES  05/28/2017   Sleep apnea 2023   does not use CPAP   Thyroid disease     PAST SURGICAL HISTORY: Past Surgical History:  Procedure Laterality Date   ABDOMINAL HYSTERECTOMY  1978   ANTERIOR CERVICAL  DECOMP/DISCECTOMY FUSION N/A 05/29/2018   Procedure: ANTERIOR CERVICAL DECOMPRESSION FUSION - CERVICAL FIVE-CERVICAL SIX - CERVICAL SIX-CERVICAL SEVEN;  Surgeon: Earnie Larsson, MD;  Location: [facility];  Service: Neurosurgery;  Laterality: N/A;   BACK SURGERY     x 2   BREAST LUMPECTOMY WITH RADIOACTIVE SEED LOCALIZATION Right 04/13/2022   Procedure: RIGHT BREAST SEED LUMPECTOMY;  Surgeon: Erroll Luna, MD;  Location: [facility];  Service: General;  Laterality: Right;   CARPAL TUNNEL RELEASE Left    COLONOSCOPY W/ BIOPSIES AND POLYPECTOMY     Hx: of   CORONARY STENT INTERVENTION N/A 05/27/2017   Procedure: CORONARY STENT INTERVENTION;  Surgeon: Burnell Blanks, MD;  Location: [place];  Service: Cardiovascular;  Laterality: N/A;   ESOPHAGOGASTRODUODENOSCOPY     HNP     LEFT HEART CATH AND CORONARY ANGIOGRAPHY N/A 05/27/2017   Procedure: LEFT HEART CATH AND CORONARY ANGIOGRAPHY;  Surgeon: Burnell Blanks, MD;  Location: [place];  Service: Cardiovascular;  Laterality: N/A;   LUMBAR LAMINECTOMY/DECOMPRESSION MICRODISCECTOMY Left 02/12/2013   Procedure: LUMBAR TWO THREE, LUMBAR THREE FOUR, LUMBAR FOUR FIVE  LAMINECTOMY/DECOMPRESSION MICRODISCECTOMY 3 LEVELS;  Surgeon: Charlie Pitter, MD;  Location: [facility];  Service: Neurosurgery;  Laterality: Left;   NEPHRECTOMY Right 2010   10.rcc cancer   TOTAL KNEE ARTHROPLASTY Right    Redo    FAMILY HISTORY: Family History  Problem Relation Age of Onset   Rheum arthritis Mother    Stroke Mother    Prostate cancer Father    Heart disease Father    Diabetes Brother    Dementia Brother    Parkinsonism Brother    Kidney disease Son    Diabetes Brother    Cancer Brother    Dementia Brother    Colon cancer Neg Hx    Esophageal cancer Neg Hx    Pancreatic cancer Neg Hx    Liver disease Neg Hx    Rectal cancer Neg Hx    Stomach cancer Neg Hx     SOCIAL HISTORY:  Social History    Socioeconomic History   Marital status: Divorced    Spouse name: Not on file   Number of children: 3   Years of education: 67  Highest education level: Not on file  Occupational History   Occupation: Retired    Fish farm manager: RETIRED  Tobacco Use   Smoking status: Former    Packs/day: 0.20    Years: 15.00    Total pack years: 3.00    Types: Cigarettes    Start date: 1970    Quit date: 09/07/2021    Years since quitting: 0.8   Smokeless tobacco: Never   Tobacco comments:    pt has been an on and off smoker since age 1  Vaping Use   Vaping Use: Never used  Substance and Sexual Activity   Alcohol use: No    Alcohol/week: 0.0 standard drinks of alcohol   Drug use: No   Sexual activity: Not Currently    Birth control/protection: Surgical    Comment: hyst  Other Topics Concern   Not on file  Social History Narrative   Single, dgtr lives with her   Current on/off smoker   Alcohol use- no   Drug use-no   Regular Exercise-yes   Social Determinants of Health   Financial Resource Strain: Medium Risk (05/21/2022)   Overall Financial Resource Strain (CARDIA)    Difficulty of Paying Living Expenses: Somewhat hard  Food Insecurity: No Food Insecurity (08/27/2021)   Hunger Vital Sign    Worried About Running Out of Food in the Last Year: Never true    Ran Out of Food in the Last Year: Never true  Transportation Needs: No Transportation Needs (08/27/2021)   PRAPARE - Hydrologist (Medical): No    Lack of Transportation (Non-Medical): No  Physical Activity: Inactive (08/27/2021)   Exercise Vital Sign    Days of Exercise per Week: 0 days    Minutes of Exercise per Session: 0 min  Stress: No Stress Concern Present (08/27/2021)   [place]    Feeling of Stress : Only a little  Social Connections: Moderately Integrated (08/27/2021)   Social Connection and Isolation Panel [NHANES]     Frequency of Communication with Friends and Family: More than three times a week    Frequency of Social Gatherings with Friends and Family: More than three times a week    Attends Religious Services: More than 4 times per year    Active Member of [employer] or Organizations: Yes    Attends Archivist Meetings: More than 4 times per year    Marital Status: Divorced  Intimate Partner Violence: Not At Risk (08/27/2021)   Humiliation, Afraid, Rape, and Kick questionnaire    Fear of Current or Ex-Partner: No    Emotionally Abused: No    Physically Abused: No    Sexually Abused: No     PHYSICAL EXAM  GENERAL EXAM/CONSTITUTIONAL: Vitals:  Vitals:   07/12/22 1316  BP: (!) 98/51  Pulse: 68  Weight: 229 lb 3.2 oz (104 kg)  Height: '5\' 6"'$  (1.676 m)   Body mass index is 36.99 kg/m. No results found. Patient is in no distress; well developed, nourished and groomed; neck is supple  CARDIOVASCULAR: Examination of carotid arteries is normal; no carotid bruits Regular rate and rhythm, no murmurs Examination of peripheral vascular system by observation and palpation is normal  EYES: Ophthalmoscopic exam of optic discs and posterior segments is normal; no papilledema or hemorrhages  MUSCULOSKELETAL: Gait, strength, tone, movements noted in Neurologic exam below  NEUROLOGIC: MENTAL STATUS:      No data to display  awake, alert, oriented to person, place and time recent and remote memory intact normal attention and concentration language fluent, comprehension intact, naming intact,  fund of knowledge appropriate  CRANIAL NERVE:  2nd - no papilledema on fundoscopic exam 2nd, 3rd, 4th, 6th - pupils equal and reactive to light, visual fields full to confrontation, extraocular muscles intact, no nystagmus 5th - facial sensation symmetric 7th - facial strength symmetric 8th - hearing intact 9th - palate elevates symmetrically, uvula midline 11th - shoulder shrug  symmetric 12th - tongue protrusion midline  MOTOR:  normal bulk and tone, full strength in the BUE, BLE  SENSORY:  normal and symmetric to light touch, temperature, vibration DECR PP IN BILATERAL HANDS  COORDINATION:  finger-nose-finger, fine finger movements normal  REFLEXES:  deep tendon reflexes TRACE and symmetric  GAIT/STATION:  narrow based gait   DIAGNOSTIC DATA (LABS, IMAGING, TESTING) - I reviewed patient records, labs, notes, testing and imaging myself where available.  Lab Results  Component Value Date   WBC 7.9 07/05/2022   HGB 14.5 07/05/2022   HCT 42.9 07/05/2022   MCV 90.0 07/05/2022   PLT 250.0 07/05/2022      Component Value Date/Time   NA 139 07/05/2022 1527   NA 139 07/31/2019 1418   K 3.6 07/05/2022 1527   CL 102 07/05/2022 1527   CO2 29 07/05/2022 1527   GLUCOSE 118 (H) 07/05/2022 1527   BUN 19 07/05/2022 1527   BUN 20 07/31/2019 1418   CREATININE 1.65 (H) 07/05/2022 1527   CREATININE 1.47 (H) 07/07/2020 1104   CALCIUM 9.6 07/05/2022 1527   PROT 7.8 07/05/2022 1527   PROT 6.9 11/10/2020 0926   ALBUMIN 4.1 07/05/2022 1527   ALBUMIN 4.3 09/11/2020 1227   AST 17 07/05/2022 1527   ALT 11 07/05/2022 1527   ALKPHOS 55 07/05/2022 1527   BILITOT 0.5 07/05/2022 1527   BILITOT 0.4 09/11/2020 1227   GFRNONAA 34 (L) 06/14/2022 0940   GFRNONAA 34 (L) 07/07/2020 1104   GFRAA 40 (L) 07/07/2020 1104   Lab Results  Component Value Date   CHOL 143 09/11/2020   HDL 56 09/11/2020   LDLCALC 69 09/11/2020   LDLDIRECT 171.5 12/06/2011   TRIG 98 09/11/2020   CHOLHDL 2.6 09/11/2020   Lab Results  Component Value Date   HGBA1C 6.3 03/24/2022   Lab Results  Component Value Date   VITAMINB12 232 11/10/2020   Lab Results  Component Value Date   TSH 2.16 07/05/2022    10/14/16 EMG/NCS 1. 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 2. Superimposed left median neuropathy at the wrist consistent with left  carpal tunnel syndrome.   01/25/21 CT head - No acute intracranial pathology.    ASSESSMENT AND PLAN  76 y.o. year old female here with rheumatoid arthritis, numbness and tingling in the hands and feet, especially with tenderness and pain in the left hand and left forearm. Left hand and forearm symptoms may be related to combination of arthritis, peripheral neuropathy and left carpal tunnel syndrome.    Dx left hand pain: rheumatoid arthritis + axonal peripheral neuropathy (due to rheumatoid arthritis or DMT or other causes) + left carpal tunnel syndrome  1. Axonal neuropathy     PLAN:  NEUROPATHY (due to rheumatoid arthritis or DMT or other cause) - manageable for now; monitor; could use gabapentin in future if pain is severe  ARTHRITIS PAIN (? seroneg RA vs crystalline arthropathy or other problem) - follow up with rheumatology clinic  at [facility] as needed  Return for return to PCP, pending if symptoms worsen or fail to improve.    Penni Bombard, MD 02/77/4128, 7:86 PM Certified in Neurology, Neurophysiology and Neuroimaging  [facility] [address], [place], [place] [address] [phone]

## 2022-07-14 ENCOUNTER — Ambulatory Visit: Payer: Medicare HMO | Admitting: Psychologist

## 2022-07-14 DIAGNOSIS — R69 Illness, unspecified: Secondary | ICD-10-CM | POA: Diagnosis not present

## 2022-07-14 DIAGNOSIS — F331 Major depressive disorder, recurrent, moderate: Secondary | ICD-10-CM

## 2022-07-14 NOTE — Progress Notes (Signed)
[place] Counselor/Therapist Progress Note  Patient ID: [name], MRN: [id number],    Date: 07/14/2022  Time Spent: 01:05 pm to 01:45 pm; total time: 40 minutes   This session was held via in person. The patient consented to in-person therapy and was in the clinician's office. Limits of confidentiality were discussed with the patient.   Treatment Type: Individual Therapy  Reported Symptoms: Less depression  Mental Status Exam: Appearance:  Well Groomed     Behavior: Appropriate  Motor: Normal  Speech/Language:  Clear and Coherent  Affect: Appropriate  Mood: normal  Thought process: normal  Thought content:   WNL  Sensory/Perceptual disturbances:   WNL  Orientation: oriented to person, place, and time/date  Attention: Good  Concentration: Good  Memory: WNL  Fund of knowledge:  Good  Insight:   Fair  Judgment:  Good  Impulse Control: Good   Risk Assessment: Danger to Self:  No Self-injurious Behavior: No Danger to Others: No Duty to Warn:no Physical Aggression / Violence:No  Access to Firearms a concern: No  Gang Involvement:No   Subjective: Beginning the session, patient stated that she has had a bad week because she has not called anyone back. She spent the session processing and reflecting on calling people back. She processed thoughts and emotions. She was agreeable to homework and following up. She denied suicidal and homicidal ideation.    Interventions:  Worked on developing a therapeutic relationship with the patient using active listening and reflective statements. Provided emotional support using empathy and validation. Reviewed the treatment plan with the patient. Validated patient's experience with not calling people back or leaving the home. Identified goals for the session. Challenged some of the thoughts expressed. Processed the idea of behavioral activation. Processed how mood sometimes follows action. Explored if there have been times  where patient has not wanted to do activities but has participated any ways. Assisted in problem solving. Processed how patient can call people and how mood may change once she has made the call. Provided empathic statements. Assigned homework Assessed for suicidal and homicidal ideation.   Homework: Call friends and people back  Next Session: Review homework and emotional support.   Diagnosis: F33.1 major depressive affective disorder, recurrent, moderate  Plan:   Goals Alleviate depressive symptoms Recognize, accept, and cope with depressive feelings Develop healthy thinking patterns Develop healthy interpersonal relationships  Objectives target date for all objectives is 04/13/2023 Cooperate with a medication evaluation by a physician Verbalize an accurate understanding of depression Verbalize an understanding of the treatment Identify and replace thoughts that support depression Learn and implement behavioral strategies Verbalize an understanding and resolution of current interpersonal problems Learn and implement problem solving and decision making skills Learn and implement conflict resolution skills to resolve interpersonal problems Verbalize an understanding of healthy and unhealthy emotions verbalize insight into how past relationships may be influence current experiences with depression Use mindfulness and acceptance strategies and increase value based behavior  Increase hopeful statements about the future.  Interventions Consistent with treatment model, discuss how change in cognitive, behavioral, and interpersonal can help client alleviate depression CBT Behavioral activation help the client explore the relationship, nature of the dispute,  Help the client develop new interpersonal skills and relationships Conduct Problem so living therapy Teach conflict resolution skills Use a process-experiential approach Conduct TLDP Conduct ACT Evaluate need for psychotropic  medication Monitor adherence to medication   The patient and clinician reviewed the treatment plan on 04/26/2022. The patient approved of the  treatment plan.   [name], PsyD

## 2022-07-18 DIAGNOSIS — G4733 Obstructive sleep apnea (adult) (pediatric): Secondary | ICD-10-CM | POA: Diagnosis not present

## 2022-07-19 ENCOUNTER — Other Ambulatory Visit: Payer: Medicare HMO

## 2022-07-19 DIAGNOSIS — R195 Other fecal abnormalities: Secondary | ICD-10-CM

## 2022-07-20 ENCOUNTER — Telehealth: Payer: Self-pay | Admitting: Pharmacist

## 2022-07-20 NOTE — Chronic Care Management (AMB) (Cosign Needed)
Chronic Care Management Pharmacy Assistant   Name: [name]  MRN: [id number] DOB: 04-26-1946  Reason for Encounter: Medication Review / Medication Coordination Call   Recent office visits:  None  Recent consult visits:  07/14/2022 [name] PsyD - Patient was seen for major depressive disorder.   07/12/2022 [name] MD (neurology) - Patient was seen for axonal neuropathy. Discontinued Gabapentin. Follow up if symptoms worsen or fail to improve.   07/05/2022 [name] PA-C (GI) - Patient was seen for loose stools and additional concerns. Started Protonix 40 mg daily. No follow up noted.   06/29/2022  [name] - Patient was seen for major depressive disorder. No medication changes. No follow up noted.   Hospital visits:  None  Medications: Outpatient Encounter Medications as of 07/20/2022  Medication Sig   acetaminophen (TYLENOL) 500 MG tablet Take 1,000 mg by mouth as needed for moderate pain or headache.    albuterol (VENTOLIN HFA) 108 (90 Base) MCG/ACT inhaler INHALE ONE OR TWO PUFFS BY MOUTH INTO THE LUNGS EVERY SIX HOURS AS NEEDED FOR WHEEZING OR SHORTNESS OF BREATH   Alirocumab (PRALUENT) 75 MG/ML SOAJ Inject 75 mg into the skin every 14 (fourteen) days.   aspirin 81 MG chewable tablet Chew 81 mg by mouth daily.   Budeson-Glycopyrrol-Formoterol (BREZTRI AEROSPHERE) 160-9-4.8 MCG/ACT AERO Inhale 2 puffs into the lungs in the morning and at bedtime.   budesonide-formoterol (SYMBICORT) 80-4.5 MCG/ACT inhaler Inhale 2 puffs then rinse mouth, twice daily   ezetimibe (ZETIA) 10 MG tablet TAKE ONE TABLET BY MOUTH EVERY MORNING   famotidine (PEPCID) 20 MG tablet TAKE ONE TABLET BY MOUTH EVERY MORNING and TAKE ONE TABLET BY MOUTH EVERYDAY AT BEDTIME   fluticasone (FLONASE) 50 MCG/ACT nasal spray PLACE 2 SPRAYS INTO BOTH NOSTRILS AT BEDTIME AS NEEDED FOR ALLERGIES.   hydrochlorothiazide (HYDRODIURIL) 25 MG tablet TAKE ONE TABLET BY MOUTH ONCE DAILY    losartan (COZAAR) 100 MG tablet TAKE ONE TABLET BY MOUTH EVERY MORNING   metoprolol succinate (TOPROL-XL) 100 MG 24 hr tablet TAKE ONE TABLET BY MOUTH AT NOON   montelukast (SINGULAIR) 10 MG tablet TAKE ONE TABLET BY MOUTH EVERYDAY AT BEDTIME   MYRBETRIQ 50 MG TB24 tablet Take 50 mg by mouth daily.   nitroGLYCERIN (NITROSTAT) 0.4 MG SL tablet Place 1 tablet (0.4 mg total) under the tongue every 5 (five) minutes as needed for chest pain.   ondansetron (ZOFRAN-ODT) 4 MG disintegrating tablet '4mg'$  ODT q4 hours prn nausea/vomit   pantoprazole (PROTONIX) 40 MG tablet Take 1 tablet (40 mg total) by mouth daily.   polyethylene glycol powder (GLYCOLAX/MIRALAX) powder Take 17 g by mouth daily.   rosuvastatin (CRESTOR) 20 MG tablet Take 1 tablet (20 mg total) by mouth at bedtime.   sertraline (ZOLOFT) 50 MG tablet Take 1 tablet (50 mg total) by mouth daily.   traMADol (ULTRAM) 50 MG tablet Take 1 tablet (50 mg total) by mouth every 6 (six) hours as needed.   No facility-administered encounter medications on file as of 07/20/2022.   Reviewed chart for medication changes ahead of medication coordination call.  BP Readings from Last 3 Encounters:  07/12/22 (!) 98/51  07/05/22 118/78  06/14/22 (!) 135/95    Lab Results  Component Value Date   HGBA1C 6.3 03/24/2022     Patient obtains medications through Adherence Packaging  30 Days    Last adherence delivery included:  Praluent Inj 75 mg - inject 75 mg every 14 days  Ezetimibe 10 mg  - take one tablet at breakfast Famotidine 20 mg - take one tablet at breakfast and one tablet at bedtime Rosuvastatin 20 mg - take one tablet at bedtime Metoprolol Suc 100 mg - take one tablet at lunch Losartan 100 mg - take one tablet at breakfast HCTZ 25 mg - take one tablet at breakfast Myrbetriq 50 mg - take one tablet at breakfast Sertraline 50 mg - take one tablet at lunch   Patient declined the following medications:  No mediations declined   Patient  is due for next adherence delivery on: 08/02/2022   Called patient and reviewed medications and coordinated delivery.   This delivery to include: Praluent Inj 75 mg - inject 75 mg every 14 days   Ezetimibe 10 mg  - take one tablet at breakfast Famotidine 20 mg - take one tablet at breakfast and one tablet at bedtime Rosuvastatin 20 mg - take one tablet at bedtime Metoprolol Suc 100 mg - take one tablet at lunch Losartan 100 mg - take one tablet at breakfast HCTZ 25 mg - take one tablet at breakfast Myrbetriq 50 mg - take one tablet at breakfast Sertraline 50 mg - take one tablet at lunch   Patient will need a short fill: No short fills needed   Coordinated acute fill: No acute fills needed   Patient declined the following medications: no medications denied.   Note: Follow up recent start of Breztri inhaler.  Is she doing well with [name] and is it helping? Patient states it is working well, her breathing has improved.  Does she want to continue on this medication? Patient plans to continue with [name] and would like to apply for PAP  Pap completed to be mailed to patient 07/22/22  Confirmed delivery date of 08/02/2022, advised patient that pharmacy will contact them the morning of delivery.   Care Gaps: AWV - scheduled 08/30/2022 Last BP - 98/51 on 07/12/2022 Last A1C - 6.3 on 03/24/2022 Urine ACR - overdue Covid - overdue Tdap - overdue Eye exam - overdue AWV - due soon Shingrix - postponed  Star Rating Drugs: Losartan 100 mg - last filled 06/28/2022 30 DS at [facility] Rosuvastatin 20 mg - last filled 06/28/2022 30 DS at [name] Assistant [phone]  Pantoprazole 40 mg was started and patient just picked it up yesterday. Plan to sync to other medications and add to packaging for next delivery.  [name], PharmD, [place] Pharmacist [facility]

## 2022-07-22 ENCOUNTER — Telehealth: Payer: Self-pay | Admitting: Internal Medicine

## 2022-07-22 ENCOUNTER — Telehealth: Payer: Self-pay | Admitting: Pharmacist

## 2022-07-22 LAB — FECAL FAT, QUALITATIVE
Fat Qual Neutral, Stl: NORMAL
Fat Qual Total, Stl: NORMAL

## 2022-07-22 MED ORDER — BREZTRI AEROSPHERE 160-9-4.8 MCG/ACT IN AERO: 2.0000 | INHALATION_SPRAY | Freq: Two times a day (BID) | RESPIRATORY_TRACT | 11 refills | Status: DC

## 2022-07-22 NOTE — Telephone Encounter (Signed)
Refills sent in to [employer] per PCP request for [place] Patient assistance. Nothing further needed

## 2022-07-22 NOTE — Chronic Care Management (AMB) (Signed)
    Chronic Care Management Pharmacy Assistant   Name: [name]  MRN: [id number] DOB: [birth year]  Reason for Encounter: Follow up PAP  07/21/22 Spoke with Tommi Rumps at [place], patient is approved for [facility] through 08/02/23 however, shipment is pending due to needing a new prescription.   I called Dr. Arlina Robes office today and spoke with Cecil Cranker, requested a new script for [facility] along with original application to be faxed to [employer] at Fax # [phone].    [place] Pharmacist Assistant [phone]

## 2022-07-23 LAB — GI PROFILE, STOOL, PCR

## 2022-07-23 NOTE — Progress Notes (Signed)
[place], script for Protonix requested to be transferred to [facility], per pharmacist this should be faxed today.

## 2022-07-27 LAB — PANCREATIC ELASTASE, FECAL: Pancreatic Elastase-1, Stool: >500 mcg/g

## 2022-07-29 NOTE — Progress Notes (Signed)
Spoke with [place] at [place], patient approved for Breztri through 08/02/23, new prescription received. Medication processed and shipped to patients home on 07/28/22, allow 10-14 days for shipping. Patient notified.

## 2022-08-10 DIAGNOSIS — J449 Chronic obstructive pulmonary disease, unspecified: Secondary | ICD-10-CM | POA: Diagnosis not present

## 2022-08-10 DIAGNOSIS — Z008 Encounter for other general examination: Secondary | ICD-10-CM | POA: Diagnosis not present

## 2022-08-10 DIAGNOSIS — R32 Unspecified urinary incontinence: Secondary | ICD-10-CM | POA: Diagnosis not present

## 2022-08-10 DIAGNOSIS — K219 Gastro-esophageal reflux disease without esophagitis: Secondary | ICD-10-CM | POA: Diagnosis not present

## 2022-08-10 DIAGNOSIS — M1990 Unspecified osteoarthritis, unspecified site: Secondary | ICD-10-CM | POA: Diagnosis not present

## 2022-08-10 DIAGNOSIS — N3281 Overactive bladder: Secondary | ICD-10-CM | POA: Diagnosis not present

## 2022-08-10 DIAGNOSIS — K5900 Constipation, unspecified: Secondary | ICD-10-CM | POA: Diagnosis not present

## 2022-08-10 DIAGNOSIS — E785 Hyperlipidemia, unspecified: Secondary | ICD-10-CM | POA: Diagnosis not present

## 2022-08-10 DIAGNOSIS — M4800 Spinal stenosis, site unspecified: Secondary | ICD-10-CM | POA: Diagnosis not present

## 2022-08-10 DIAGNOSIS — I252 Old myocardial infarction: Secondary | ICD-10-CM | POA: Diagnosis not present

## 2022-08-10 DIAGNOSIS — R69 Illness, unspecified: Secondary | ICD-10-CM | POA: Diagnosis not present

## 2022-08-10 DIAGNOSIS — I2510 Atherosclerotic heart disease of native coronary artery without angina pectoris: Secondary | ICD-10-CM | POA: Diagnosis not present

## 2022-08-17 ENCOUNTER — Ambulatory Visit: Payer: Medicare HMO | Admitting: Psychologist

## 2022-08-17 DIAGNOSIS — F331 Major depressive disorder, recurrent, moderate: Secondary | ICD-10-CM

## 2022-08-17 DIAGNOSIS — R69 Illness, unspecified: Secondary | ICD-10-CM | POA: Diagnosis not present

## 2022-08-17 NOTE — Progress Notes (Signed)
[place] Counselor/Therapist Progress Note  Patient ID: [name], MRN: [id number],    Date: 08/17/2022  Time Spent: 01:09 pm to 01:49 pm; total time: 40 minutes   This session was held via in person. The patient consented to in-person therapy and was in the clinician's office. Limits of confidentiality were discussed with the patient.   Treatment Type: Individual Therapy  Reported Symptoms: Feeling "empty"  Mental Status Exam: Appearance:  Well Groomed     Behavior: Appropriate  Motor: Normal  Speech/Language:  Clear and Coherent  Affect: Appropriate  Mood: normal  Thought process: normal  Thought content:   WNL  Sensory/Perceptual disturbances:   WNL  Orientation: oriented to person, place, and time/date  Attention: Good  Concentration: Good  Memory: WNL  Fund of knowledge:  Good  Insight:   Fair  Judgment:  Good  Impulse Control: Good   Risk Assessment: Danger to Self:  No Self-injurious Behavior: No Danger to Others: No Duty to Warn:no Physical Aggression / Violence:No  Access to Firearms a concern: No  Gang Involvement:No   Subjective: Beginning the session, patient stated that she has having experienced a good holiday season and reflected on why it was good. From there, she talked about feeling "empty". She explored what this feeling was telling her to do. She identified being sociable with others. She reflected on ways to increase being sociable. She was agreeable to homework and following up. She denied suicidal and homicidal ideation.    Interventions:  Worked on developing a therapeutic relationship with the patient using active listening and reflective statements. Provided emotional support using empathy and validation. Reviewed the treatment plan with the patient. Reviewed the holiday season and process how it went for the patient. Normalized and validated expressed thoughts and emotions. Identified goals for the session. Used socratic  questions to assist the patient. Identified the feeling of "empty" and explored what this sensation was trying to tell the patient. Processed barriers and how to overcome those barriers. Attempted to explore who could hold the patient accountable for her actions. Challenged her thoughts and action of plan. Explored more realistic plans. Provided empathic statements. Assigned homework Assessed for suicidal and homicidal ideation.   Homework: Attempt to socialize with a couple of people  Next Session: Review homework and emotional support.   Diagnosis: F33.1 major depressive affective disorder, recurrent, moderate  Plan:   Goals Alleviate depressive symptoms Recognize, accept, and cope with depressive feelings Develop healthy thinking patterns Develop healthy interpersonal relationships  Objectives target date for all objectives is 04/13/2023 Cooperate with a medication evaluation by a physician Verbalize an accurate understanding of depression Verbalize an understanding of the treatment Identify and replace thoughts that support depression Learn and implement behavioral strategies Verbalize an understanding and resolution of current interpersonal problems Learn and implement problem solving and decision making skills Learn and implement conflict resolution skills to resolve interpersonal problems Verbalize an understanding of healthy and unhealthy emotions verbalize insight into how past relationships may be influence current experiences with depression Use mindfulness and acceptance strategies and increase value based behavior  Increase hopeful statements about the future.  Interventions Consistent with treatment model, discuss how change in cognitive, behavioral, and interpersonal can help client alleviate depression CBT Behavioral activation help the client explore the relationship, nature of the dispute,  Help the client develop new interpersonal skills and relationships Conduct  Problem so living therapy Teach conflict resolution skills Use a process-experiential approach Conduct TLDP Conduct ACT Evaluate need for  psychotropic medication Monitor adherence to medication   The patient and clinician reviewed the treatment plan on 04/26/2022. The patient approved of the treatment plan.   [name], PsyD

## 2022-08-18 ENCOUNTER — Telehealth: Payer: Self-pay | Admitting: Internal Medicine

## 2022-08-18 DIAGNOSIS — G4733 Obstructive sleep apnea (adult) (pediatric): Secondary | ICD-10-CM | POA: Diagnosis not present

## 2022-08-18 NOTE — Telephone Encounter (Signed)
I spoke with the [name] and the Pharmacist and she is still using the [employer]... she is needing to come back and see Dr Harrington Challenger... appt made for 09/02/2022.

## 2022-08-18 NOTE — Telephone Encounter (Signed)
Pt c/o medication issue:  1. Name of Medication: Repatha   2. How are you currently taking this medication (dosage and times per day)?   3. Are you having a reaction (difficulty breathing--STAT)?   4. What is your medication issue? [place] with [facility] is calling to get updated information about this medication and to see if this is something the patient is still currently taking. Please advise.

## 2022-08-19 ENCOUNTER — Ambulatory Visit: Payer: Medicare HMO | Admitting: Gastroenterology

## 2022-08-19 ENCOUNTER — Telehealth: Payer: Self-pay | Admitting: Pharmacist

## 2022-08-19 NOTE — Progress Notes (Signed)
Care Management & Coordination Services Pharmacy Team   Name: [name]  MRN: [id number] DOB: 03/01/1946  Reason for Encounter: Medication coordination and delivery  Contacted patient on 08/19/2022 to discuss medications   Recent office visits:  None  Recent consult visits:  08/17/2022 [place] - Patient was seen for Major depressive disorder, recurrent episode, moderate. No medication changes.   Hospital visits:  None  Medications: Outpatient Encounter Medications as of 08/19/2022  Medication Sig   acetaminophen (TYLENOL) 500 MG tablet Take 1,000 mg by mouth as needed for moderate pain or headache.    albuterol (VENTOLIN HFA) 108 (90 Base) MCG/ACT inhaler INHALE ONE OR TWO PUFFS BY MOUTH INTO THE LUNGS EVERY SIX HOURS AS NEEDED FOR WHEEZING OR SHORTNESS OF BREATH   Alirocumab (PRALUENT) 75 MG/ML SOAJ Inject 75 mg into the skin every 14 (fourteen) days.   aspirin 81 MG chewable tablet Chew 81 mg by mouth daily.   Budeson-Glycopyrrol-Formoterol (BREZTRI AEROSPHERE) 160-9-4.8 MCG/ACT AERO Inhale 2 puffs into the lungs in the morning and at bedtime.   budesonide-formoterol (SYMBICORT) 80-4.5 MCG/ACT inhaler Inhale 2 puffs then rinse mouth, twice daily   ezetimibe (ZETIA) 10 MG tablet TAKE ONE TABLET BY MOUTH EVERY MORNING   famotidine (PEPCID) 20 MG tablet TAKE ONE TABLET BY MOUTH EVERY MORNING and TAKE ONE TABLET BY MOUTH EVERYDAY AT BEDTIME   fluticasone (FLONASE) 50 MCG/ACT nasal spray PLACE 2 SPRAYS INTO BOTH NOSTRILS AT BEDTIME AS NEEDED FOR ALLERGIES.   hydrochlorothiazide (HYDRODIURIL) 25 MG tablet TAKE ONE TABLET BY MOUTH ONCE DAILY   losartan (COZAAR) 100 MG tablet TAKE ONE TABLET BY MOUTH EVERY MORNING   metoprolol succinate (TOPROL-XL) 100 MG 24 hr tablet TAKE ONE TABLET BY MOUTH AT NOON   montelukast (SINGULAIR) 10 MG tablet TAKE ONE TABLET BY MOUTH EVERYDAY AT BEDTIME   MYRBETRIQ 50 MG TB24 tablet Take 50 mg by mouth daily.   nitroGLYCERIN (NITROSTAT) 0.4 MG SL  tablet Place 1 tablet (0.4 mg total) under the tongue every 5 (five) minutes as needed for chest pain.   ondansetron (ZOFRAN-ODT) 4 MG disintegrating tablet '4mg'$  ODT q4 hours prn nausea/vomit   pantoprazole (PROTONIX) 40 MG tablet Take 1 tablet (40 mg total) by mouth daily.   polyethylene glycol powder (GLYCOLAX/MIRALAX) powder Take 17 g by mouth daily.   rosuvastatin (CRESTOR) 20 MG tablet Take 1 tablet (20 mg total) by mouth at bedtime.   sertraline (ZOLOFT) 50 MG tablet Take 1 tablet (50 mg total) by mouth daily.   traMADol (ULTRAM) 50 MG tablet Take 1 tablet (50 mg total) by mouth every 6 (six) hours as needed.   No facility-administered encounter medications on file as of 08/19/2022.   BP Readings from Last 3 Encounters:  07/12/22 (!) 98/51  07/05/22 118/78  06/14/22 (!) 135/95    Pulse Readings from Last 3 Encounters:  07/12/22 68  07/05/22 78  06/14/22 61    Lab Results  Component Value Date/Time   HGBA1C 6.3 03/24/2022 03:14 PM   HGBA1C 5.9 04/17/2021 12:47 PM   Lab Results  Component Value Date   CREATININE 1.65 (H) 07/05/2022   BUN 19 07/05/2022   GFR 29.60 (L) 07/05/2022   GFRNONAA 34 (L) 06/14/2022   GFRAA 40 (L) 07/07/2020   NA 139 07/05/2022   K 3.6 07/05/2022   CALCIUM 9.6 07/05/2022   CO2 29 07/05/2022    Last adherence delivery date:08/02/2022      Patient is due for next adherence delivery on: 08/31/2022  Spoke with patient on 08/19/2022 reviewed medications and coordinated delivery.  This delivery to include: Adherence Packaging  30 Days  Praluent Inj 75 mg - inject 75 mg every 14 days  Pantoprazole 40 mg - 1 tablet at breakfast Ezetimibe 10 mg  - take one tablet at breakfast Famotidine 20 mg - take one tablet at breakfast and one tablet at bedtime Rosuvastatin 20 mg - take one tablet at bedtime Metoprolol Suc 100 mg - take one tablet at lunch Losartan 100 mg - take one tablet at breakfast HCTZ 25 mg - take one tablet at breakfast Myrbetriq 50 mg  - take one tablet at breakfast Sertraline 50 mg - take one tablet at lunch  Patient declined the following medications this month: None  Confirmed delivery date of 08/31/2021, advised patient that pharmacy will contact them the morning of delivery.  Any concerns about your medications? Patient denies  How often do you forget or accidentally miss a dose? Patient denies any missed doses  Is patient in packaging Yes  Recent blood pressure readings are as follows: Patient has not been recording blood pressures.   Cycle dispensing form sent to [facility] for review.  Note: Spoke with [place] at [facility], patient is approved for Praluent assistance through 08/23/2023.  All her information will be the same as 2023-2024 except her new Pharmacy Insurance Card ID # will be [id number]. Message sent to West[place] Hummer to fax this information to the pharmacy when the approval fax from [facility] is received.   Care Gaps: AWV - scheduled 08/30/2022 Last BP - 98/51 on 07/12/2022 Last A1C - 6.3 on 03/24/2022 Shingrix - never done Urine ACR - overdue Covid - overdue Tdap - overdue Eye exam - overdue Foot exam - overdue  Star Rating Drugs: Losartan 100 mg - last filled 07/29/2022 30 DS at [facility] Rosuvastatin 20 mg - last filled 07/29/2022 30 DS at [place] [phone]

## 2022-08-20 ENCOUNTER — Ambulatory Visit: Payer: Medicare HMO | Admitting: Gastroenterology

## 2022-08-20 ENCOUNTER — Encounter: Payer: Self-pay | Admitting: Gastroenterology

## 2022-08-20 VITALS — BP 110/70 | HR 69 | Ht 66.0 in | Wt 229.0 lb

## 2022-08-20 DIAGNOSIS — Z86010 Personal history of colonic polyps: Secondary | ICD-10-CM | POA: Diagnosis not present

## 2022-08-20 DIAGNOSIS — R194 Change in bowel habit: Secondary | ICD-10-CM | POA: Diagnosis not present

## 2022-08-20 DIAGNOSIS — K219 Gastro-esophageal reflux disease without esophagitis: Secondary | ICD-10-CM | POA: Diagnosis not present

## 2022-08-20 DIAGNOSIS — R197 Diarrhea, unspecified: Secondary | ICD-10-CM | POA: Diagnosis not present

## 2022-08-20 NOTE — Progress Notes (Signed)
Chief Complaint:    Diarrhea  GI History: 77 y.o. female  with a past medical history of anxiety, CAD status post stent in 2018 on Plavix (08/31/2020 echo with LVEF normal at 60-65% and no aortic stenosis) , history of C. difficile colitis, personal history of adenomatous polyps, reflux and others listed below, returns to clinic today for evaluation of diarrhea.    04/18/2015 colonoscopy Dr. Deatra Ina with sessile polyp in the ascending colon.  Pathology showed tubular adenoma and repeat was recommended in 5 years.   02/05/2020 EGD for GERD and dysphagia with gastritis and otherwise normal.   Biopsies showed mild chronic gastritis.  02/05/2020 colonoscopy with 5 polyps and diverticulosis in the left colon, biopsy showed tubular adenomas and repeat recommended in 3 years. 01/2023   10/27/2021 office visit with Ellouise Newer for loose stools and intermittent abdominal discomfort associated with red meat. 06/10/2022 ER visit for generally not feeling well, had COVID September 2023, had decreased p.o. intake, had hypokalemia, given fluids and potassium. 06/14/2022 went to the ER with atypical chest pain troponin negative x2, CBC without leukocytosis or anemia, BMP stable CKD.   Perfusion lung scan per positive D-dimer negative.  HPI:     Patient is a 77 y.o. female presenting to the [facility] for follow-up.  Was last seen by [name] on 07/05/2022.  Main issue at that time was diarrhea with loose, nonbloody stools following COVID in September.  This was a departure from her baseline constipation, for which she has been taking MiraLAX.  She was treated with low FODMAP diet, conservative measures.  Negative/normal GI PCR panel, pancreatic elastase, fecal fat, CBC, TSH, stable kidney function with normal electrolytes and liver enzymes.  She states her diarrhea has improved, but not completely resolved just yet. Still with post prandial urgency, with 3-4 BM/day. Stools have more form, but  still occasional watery/loose. No hematochezia. Appetite still not yet back to baseline. Not early satiety or nausea.    Review of systems:     No chest pain, no SOB, no fevers, no urinary sx   Past Medical History:  Diagnosis Date   Allergy    Anxiety    C. difficile diarrhea    history of   CAD (coronary artery disease), native coronary artery    a. 05/2017 showed 100% mLCx tx with DES, otherwise 20% mRCA, 50% ramus, 30% dLAD, 20% prox-mid LAD, 20% mLAD treated medically.    Cancer [facility])    Right kidney CA removed.    CKD (chronic kidney disease) stage 3, GFR 30-59 ml/min (HCC) 10/11/2016   s/p R nephrectomy   Colon polyps 2008   HYPERPLASTIC   COPD (chronic obstructive pulmonary disease) ([place])    former smoker   Gastritis    GERD (gastroesophageal reflux disease)    History of echocardiogram    Echo 3/18:  Moderate LVH, EF 71-69, grade 1 diastolic dysfunction, calcified aortic valve, mild MR, moderate LAE   History of nuclear stress test    Myoview 3/18: Mod size and intensity fixed septal defect, may be artifact.Opposite mod size and intensity lat defect, which is reversible and could represent ischemia or possibly artifact (SDS 4). LVEF 71% with normal wall motion. Intermediate risk study. >> images reviewed with Dr. Dorris Carnes - no sig ischemia; med rx    Hx of cardiovascular stress test    Lexiscan Myoview 6/16:  EF 70%, no scar or ischemia; Low Risk   Hyperlipidemia    Hypertension  Neuropathy    Orthostatic hypotension 05/28/2017   OSA (obstructive sleep apnea) 01/16/2021   Osteoarthritis    Pre-diabetes    no meds   Rheumatoid arthritis (HCC)    RA (Dr. Ouida Sills) Bilateral hands   S/P angioplasty with stent 05/27/17 to LCX with DES  05/28/2017   Sleep apnea 2023   does not use CPAP   Thyroid disease     Patient's surgical history, family medical history, social history, medications and allergies were all reviewed in Epic    Current Outpatient Medications   Medication Sig Dispense Refill   acetaminophen (TYLENOL) 500 MG tablet Take 1,000 mg by mouth as needed for moderate pain or headache.      albuterol (VENTOLIN HFA) 108 (90 Base) MCG/ACT inhaler INHALE ONE OR TWO PUFFS BY MOUTH INTO THE LUNGS EVERY SIX HOURS AS NEEDED FOR WHEEZING OR SHORTNESS OF BREATH 8.5 g 12   Alirocumab (PRALUENT) 75 MG/ML SOAJ Inject 75 mg into the skin every 14 (fourteen) days. 2 mL 11   aspirin 81 MG chewable tablet Chew 81 mg by mouth daily.     Budeson-Glycopyrrol-Formoterol (BREZTRI AEROSPHERE) 160-9-4.8 MCG/ACT AERO Inhale 2 puffs into the lungs in the morning and at bedtime. 2 each 11   budesonide-formoterol (SYMBICORT) 80-4.5 MCG/ACT inhaler Inhale 2 puffs then rinse mouth, twice daily 1 each 12   ezetimibe (ZETIA) 10 MG tablet TAKE ONE TABLET BY MOUTH EVERY MORNING 90 tablet 2   famotidine (PEPCID) 20 MG tablet TAKE ONE TABLET BY MOUTH EVERY MORNING and TAKE ONE TABLET BY MOUTH EVERYDAY AT BEDTIME 180 tablet 2   fluticasone (FLONASE) 50 MCG/ACT nasal spray PLACE 2 SPRAYS INTO BOTH NOSTRILS AT BEDTIME AS NEEDED FOR ALLERGIES. 48 mL 1   hydrochlorothiazide (HYDRODIURIL) 25 MG tablet TAKE ONE TABLET BY MOUTH ONCE DAILY 90 tablet 2   losartan (COZAAR) 100 MG tablet TAKE ONE TABLET BY MOUTH EVERY MORNING 90 tablet 2   metoprolol succinate (TOPROL-XL) 100 MG 24 hr tablet TAKE ONE TABLET BY MOUTH AT NOON 90 tablet 1   montelukast (SINGULAIR) 10 MG tablet TAKE ONE TABLET BY MOUTH EVERYDAY AT BEDTIME 30 tablet 3   MYRBETRIQ 50 MG TB24 tablet Take 50 mg by mouth daily.     nitroGLYCERIN (NITROSTAT) 0.4 MG SL tablet Place 1 tablet (0.4 mg total) under the tongue every 5 (five) minutes as needed for chest pain. 25 tablet 2   ondansetron (ZOFRAN-ODT) 4 MG disintegrating tablet '4mg'$  ODT q4 hours prn nausea/vomit 20 tablet 0   pantoprazole (PROTONIX) 40 MG tablet Take 1 tablet (40 mg total) by mouth daily. 30 tablet 3   rosuvastatin (CRESTOR) 20 MG tablet Take 1 tablet (20 mg  total) by mouth at bedtime. 90 tablet 3   sertraline (ZOLOFT) 50 MG tablet Take 1 tablet (50 mg total) by mouth daily. 90 tablet 1   traMADol (ULTRAM) 50 MG tablet Take 1 tablet (50 mg total) by mouth every 6 (six) hours as needed. 20 tablet 0   No current facility-administered medications for this visit.    Physical Exam:     BP 110/70   Pulse 69   Ht '5\' 6"'$  (1.676 m)   Wt 229 lb (103.9 kg)   BMI 36.96 kg/m   GENERAL:  Pleasant female in NAD PSYCH: : Cooperative, normal affect EENT:  conjunctiva pink, mucous membranes moist, neck supple without masses CARDIAC:  RRR, no murmur heard, no peripheral edema PULM: Normal respiratory effort, lungs CTA bilaterally, no wheezing ABDOMEN:  Nondistended, soft, nontender. No obvious masses, no hepatomegaly,  normal bowel sounds SKIN:  turgor, no lesions seen Musculoskeletal:  Normal muscle tone, normal strength NEURO: Alert and oriented x 3, no focal neurologic deficits   IMPRESSION and PLAN:    1) Change in bowel habits 2) Diarrhea Longstanding history of IBS-C which she had been treated with MiraLAX, but now with loose/watery, nonbloody stools since COVID in September.  Discussed DDx to include protracted course of postinfectious IBS, SIBO, etc. with plan for the following:  - Start rifaximin 550 mg tid x 14 days, RF 1.  She will return next week when we should have some samples in distribute to her - Start probiotic and continue at least 4 weeks beyond completion of rifaximin course - Start fiber supplement - If still no appreciable change, can consider upper endoscopy/colonoscopy to evaluate for additional mucosal/luminal pathology  3) History of colon polyps - Due for repeat colonoscopy in 01/2023 for ongoing polyp surveillance  4) IBS-C Historically has IBS-C, but has been dealing with diarrhea for the last several months as above.  Treatment as above  5) GERD - Well-controlled on current regimen  I spent 30 minutes of time,  including in depth chart review, independent review of results as outlined above, communicating results with the patient directly, face-to-face time with the patient, coordinating care, and ordering studies and medications as appropriate, and documentation.           Lavena Bullion ,DO, FACG 08/20/2022, 2:13 PM

## 2022-08-20 NOTE — Patient Instructions (Addendum)
We will call you for Xifaxan 550 MG sample once we received it.  Start taking Align over the counter.  Start taking Fiber Supplement.   _______________________________________________________  If your blood pressure at your visit was 140/90 or greater, please contact your primary care physician to follow up on this.  _______________________________________________________  If you are age 79 or older, your body mass index should be between 23-30. Your Body mass index is 36.96 kg/m. If this is out of the aforementioned range listed, please consider follow up with your Primary Care Provider.  If you are age 63 or younger, your body mass index should be between 19-25. Your Body mass index is 36.96 kg/m. If this is out of the aformentioned range listed, please consider follow up with your Primary Care Provider.   __________________________________________________________  The [facility] providers would like to encourage you to use [employer] to communicate with providers for non-urgent requests or questions.  Due to long hold times on the telephone, sending your provider a message by [facility] may be a faster and more efficient way to get a response.  Please allow 48 business hours for a response.  Please remember that this is for non-urgent requests.

## 2022-08-26 DIAGNOSIS — H5203 Hypermetropia, bilateral: Secondary | ICD-10-CM | POA: Diagnosis not present

## 2022-08-26 DIAGNOSIS — H2513 Age-related nuclear cataract, bilateral: Secondary | ICD-10-CM | POA: Diagnosis not present

## 2022-08-31 NOTE — Progress Notes (Unsigned)
Cardiology Office Note   Date:  09/02/2022   ID:  Surya, Folden 1946-03-12, MRN [id number]  PCP:  [name], [name], MD  Cardiologist:   Dorris Carnes, MD   Patient presents for f/u of CAD and HTN      History of Present Illness: [name] is a 77 y.o. female with a history of CAD The patient presented with CP in 2018.   CT coronary angiogram done   with FFR 0.82 mid/LCx; 0.78 distal LC.  Cardac cath showed:  LAD 20%; Ramus 50%;  RCA 20% LCx 100%  The pt underent PTCA/DES to LCx   .  Patient also has a history of hypertension, hyperlipidemia, diabetes, chronic kidney disease (status post right nephrectomy), rheumatoid arthritis, OSA and esophageal stricture  Jan 2022  Admitted with CP    Myoview done   No ischemia    I saw the pt in Feb 2022  At that time diet :  Breakfast:  Banana or muffin   Occasional egg and toast or sausague Lunch   Mostlly breakfast is lunch     Crackers if eats extra Dinner:  PB J sandwich     Noodles   Occasional cook Drinks:   Pepsi     I saw the pt in Nov 2022   She was seen by Elvin So in July 2023  Since seen she denies CP   Breathing is OK   Has occasional palpitations   Last few min   No dizzienss   Occur a few times per week   BP is good at home, better than here   Starting to use CPAP    Current Meds  Medication Sig   acetaminophen (TYLENOL) 500 MG tablet Take 1,000 mg by mouth as needed for moderate pain or headache.    albuterol (VENTOLIN HFA) 108 (90 Base) MCG/ACT inhaler INHALE ONE OR TWO PUFFS BY MOUTH INTO THE LUNGS EVERY SIX HOURS AS NEEDED FOR WHEEZING OR SHORTNESS OF BREATH   Alirocumab (PRALUENT) 75 MG/ML SOAJ Inject 75 mg into the skin every 14 (fourteen) days.   aspirin 81 MG chewable tablet Chew 81 mg by mouth daily.   Budeson-Glycopyrrol-Formoterol (BREZTRI AEROSPHERE) 160-9-4.8 MCG/ACT AERO Inhale 2 puffs into the lungs in the morning and at bedtime.   budesonide-formoterol (SYMBICORT) 80-4.5 MCG/ACT inhaler Inhale 2 puffs then  rinse mouth, twice daily   ezetimibe (ZETIA) 10 MG tablet TAKE ONE TABLET BY MOUTH EVERY MORNING   famotidine (PEPCID) 20 MG tablet TAKE ONE TABLET BY MOUTH EVERY MORNING and TAKE ONE TABLET BY MOUTH EVERYDAY AT BEDTIME   fluticasone (FLONASE) 50 MCG/ACT nasal spray PLACE 2 SPRAYS INTO BOTH NOSTRILS AT BEDTIME AS NEEDED FOR ALLERGIES.   gabapentin (NEURONTIN) 300 MG capsule Take 300 mg by mouth 2 (two) times daily.   hydrochlorothiazide (HYDRODIURIL) 25 MG tablet TAKE ONE TABLET BY MOUTH ONCE DAILY   losartan (COZAAR) 100 MG tablet TAKE ONE TABLET BY MOUTH EVERY MORNING   metoprolol succinate (TOPROL-XL) 100 MG 24 hr tablet TAKE ONE TABLET BY MOUTH AT NOON   montelukast (SINGULAIR) 10 MG tablet TAKE ONE TABLET BY MOUTH EVERYDAY AT BEDTIME   MYRBETRIQ 50 MG TB24 tablet Take 50 mg by mouth daily.   nitroGLYCERIN (NITROSTAT) 0.4 MG SL tablet Place 1 tablet (0.4 mg total) under the tongue every 5 (five) minutes as needed for chest pain.   ondansetron (ZOFRAN-ODT) 4 MG disintegrating tablet '4mg'$  ODT q4 hours prn nausea/vomit   pantoprazole (PROTONIX)  40 MG tablet Take 1 tablet (40 mg total) by mouth daily.   rosuvastatin (CRESTOR) 20 MG tablet Take 1 tablet (20 mg total) by mouth at bedtime.   sertraline (ZOLOFT) 50 MG tablet Take 1 tablet (50 mg total) by mouth daily.   traMADol (ULTRAM) 50 MG tablet Take 1 tablet (50 mg total) by mouth every 6 (six) hours as needed.     Allergies:   Oxycodone-acetaminophen, Percocet [oxycodone-acetaminophen], Pravastatin sodium, Hydrocodone, and Aspirin   Past Medical History:  Diagnosis Date   Allergy    Anxiety    C. difficile diarrhea    history of   CAD (coronary artery disease), native coronary artery    a. 05/2017 showed 100% mLCx tx with DES, otherwise 20% mRCA, 50% ramus, 30% dLAD, 20% prox-mid LAD, 20% mLAD treated medically.    Cancer [facility])    Right kidney CA removed.    CKD (chronic kidney disease) stage 3, GFR 30-59 ml/min (HCC) 10/11/2016    s/p R nephrectomy   Colon polyps 2008   HYPERPLASTIC   COPD (chronic obstructive pulmonary disease) ([place])    former smoker   Gastritis    GERD (gastroesophageal reflux disease)    History of echocardiogram    Echo 3/18:  Moderate LVH, EF 32-67, grade 1 diastolic dysfunction, calcified aortic valve, mild MR, moderate LAE   History of nuclear stress test    Myoview 3/18: Mod size and intensity fixed septal defect, may be artifact.Opposite mod size and intensity lat defect, which is reversible and could represent ischemia or possibly artifact (SDS 4). LVEF 71% with normal wall motion. Intermediate risk study. >> images reviewed with Dr. Dorris Carnes - no sig ischemia; med rx    Hx of cardiovascular stress test    Lexiscan Myoview 6/16:  EF 70%, no scar or ischemia; Low Risk   Hyperlipidemia    Hypertension    Neuropathy    Orthostatic hypotension 05/28/2017   OSA (obstructive sleep apnea) 01/16/2021   Osteoarthritis    Pre-diabetes    no meds   Rheumatoid arthritis (HCC)    RA (Dr. Ouida Sills) Bilateral hands   S/P angioplasty with stent 05/27/17 to LCX with DES  05/28/2017   Sleep apnea 2023   does not use CPAP   Thyroid disease     Past Surgical History:  Procedure Laterality Date   ABDOMINAL HYSTERECTOMY  1978   ANTERIOR CERVICAL DECOMP/DISCECTOMY FUSION N/A 05/29/2018   Procedure: ANTERIOR CERVICAL DECOMPRESSION FUSION - CERVICAL FIVE-CERVICAL SIX - CERVICAL SIX-CERVICAL SEVEN;  Surgeon: Earnie Larsson, MD;  Location: [place];  Service: Neurosurgery;  Laterality: N/A;   BACK SURGERY     x 2   BREAST LUMPECTOMY WITH RADIOACTIVE SEED LOCALIZATION Right 04/13/2022   Procedure: RIGHT BREAST SEED LUMPECTOMY;  Surgeon: Erroll Luna, MD;  Location: [place];  Service: General;  Laterality: Right;   CARPAL TUNNEL RELEASE Left    COLONOSCOPY W/ BIOPSIES AND POLYPECTOMY     Hx: of   CORONARY STENT INTERVENTION N/A 05/27/2017   Procedure: CORONARY STENT INTERVENTION;   Surgeon: Burnell Blanks, MD;  Location: [place] [facility];  Service: Cardiovascular;  Laterality: N/A;   ESOPHAGOGASTRODUODENOSCOPY     HNP     LEFT HEART CATH AND CORONARY ANGIOGRAPHY N/A 05/27/2017   Procedure: LEFT HEART CATH AND CORONARY ANGIOGRAPHY;  Surgeon: Burnell Blanks, MD;  Location: [place] [facility];  Service: Cardiovascular;  Laterality: N/A;   LUMBAR LAMINECTOMY/DECOMPRESSION MICRODISCECTOMY Left 02/12/2013  Procedure: LUMBAR TWO THREE, LUMBAR THREE FOUR, LUMBAR FOUR FIVE  LAMINECTOMY/DECOMPRESSION MICRODISCECTOMY 3 LEVELS;  Surgeon: Charlie Pitter, MD;  Location: [facility];  Service: Neurosurgery;  Laterality: Left;   NEPHRECTOMY Right 2010   10.rcc cancer   TOTAL KNEE ARTHROPLASTY Right    Redo     Social History:  The patient  reports that she quit smoking about a year ago. Her smoking use included cigarettes. She started smoking about 54 years ago. She has a 3.00 pack-year smoking history. She has never used smokeless tobacco. She reports that she does not drink alcohol and does not use drugs.   Family History:  The patient's family history includes Cancer in her brother; Dementia in her brother and brother; Diabetes in her brother and brother; Heart disease in her father; Kidney disease in her son; Parkinsonism in her brother; Prostate cancer in her father; Rheum arthritis in her mother; Stroke in her mother.    ROS:  Please see the history of present illness. All other systems are reviewed and  Negative to the above problem except as noted.    PHYSICAL EXAM: VS:  BP 136/80   Pulse 60   Ht '5\' 6"'$  (1.676 m)   Wt 230 lb 6.4 oz (104.5 kg)   SpO2 95%   BMI 37.19 kg/m   BDZ:HGDJMEQA obese 77yo in  in no acute distress  HEENT: normal  Neck: JVP is normal  Cardiac: RRR; gr I/VI systolic murmur No LE  edema  Respiratory:  clear to auscultation  GI: soft, nontender  No masses MS: no deformity Moving all extremities   Skin: warm and dry, no  rash Neuro:  Strength and sensation are intact Psych: euthymic mood, full affect   EKG:  EKG is not  ordered today.   Lipid Panel    Component Value Date/Time   CHOL 143 09/11/2020 1227   TRIG 98 09/11/2020 1227   HDL 56 09/11/2020 1227   CHOLHDL 2.6 09/11/2020 1227   CHOLHDL 1.9 08/31/2020 0335   VLDL 20 08/31/2020 0335   LDLCALC 69 09/11/2020 1227   LDLCALC 98 03/26/2020 1041   LDLDIRECT 171.5 12/06/2011 0812      Wt Readings from Last 3 Encounters:  09/02/22 230 lb 6.4 oz (104.5 kg)  08/20/22 229 lb (103.9 kg)  07/12/22 229 lb 3.2 oz (104 kg)      ASSESSMENT AND PLAN:  1.  CAD. Last intervention back in 2018.to LCx   Last myoview in Jan 2022   No ischemia   No symptoms of angina   Follow      2.  Hypertension.BP is OK   Keep on current regimen   3.  Hyperlipidemia.  Pt on Praluent, Zetia and Crstor   Get labs today     4  Palpitations   Get zio patch   2 wks     5  Metabolics  S3M 6.3 last aug 2023   Limit craabs      Follow up in the fall     Current medicines are reviewed at length with the patient today.  The patient does not have concerns regarding medicines.  Signed, Dorris Carnes, MD  09/02/2022 4:34 PM    [facility] [address], [place], [place]  [address] Phone: [phone]; Fax: [phone]

## 2022-09-02 ENCOUNTER — Ambulatory Visit: Payer: Medicare HMO

## 2022-09-02 ENCOUNTER — Ambulatory Visit: Payer: Medicare HMO | Attending: Internal Medicine | Admitting: Internal Medicine

## 2022-09-02 ENCOUNTER — Other Ambulatory Visit: Payer: Self-pay | Admitting: Internal Medicine

## 2022-09-02 ENCOUNTER — Encounter: Payer: Self-pay | Admitting: Internal Medicine

## 2022-09-02 ENCOUNTER — Ambulatory Visit: Payer: Medicare HMO | Admitting: Psychologist

## 2022-09-02 VITALS — BP 136/80 | HR 60 | Ht 66.0 in | Wt 230.4 lb

## 2022-09-02 DIAGNOSIS — R002 Palpitations: Secondary | ICD-10-CM

## 2022-09-02 DIAGNOSIS — E785 Hyperlipidemia, unspecified: Secondary | ICD-10-CM | POA: Diagnosis not present

## 2022-09-02 DIAGNOSIS — F331 Major depressive disorder, recurrent, moderate: Secondary | ICD-10-CM | POA: Diagnosis not present

## 2022-09-02 DIAGNOSIS — I498 Other specified cardiac arrhythmias: Secondary | ICD-10-CM

## 2022-09-02 DIAGNOSIS — R69 Illness, unspecified: Secondary | ICD-10-CM | POA: Diagnosis not present

## 2022-09-02 DIAGNOSIS — E1169 Type 2 diabetes mellitus with other specified complication: Secondary | ICD-10-CM | POA: Diagnosis not present

## 2022-09-02 NOTE — Progress Notes (Signed)
[facility] Counselor/Therapist Progress Note  Patient ID: [name], MRN: [id number],    Date: 09/02/2022  Time Spent: 02:07 pm to 02:45 pm; total time: 38 minutes   This session was held via in person. The patient consented to in-person therapy and was in the clinician's office. Limits of confidentiality were discussed with the patient.   Treatment Type: Individual Therapy  Reported Symptoms: Described self as less depressed and more active  Mental Status Exam: Appearance:  Well Groomed     Behavior: Appropriate  Motor: Normal  Speech/Language:  Clear and Coherent  Affect: Appropriate  Mood: normal  Thought process: normal  Thought content:   WNL  Sensory/Perceptual disturbances:   WNL  Orientation: oriented to person, place, and time/date  Attention: Good  Concentration: Good  Memory: WNL  Fund of knowledge:  Good  Insight:   Fair  Judgment:  Good  Impulse Control: Good   Risk Assessment: Danger to Self:  No Self-injurious Behavior: No Danger to Others: No Duty to Warn:no Physical Aggression / Violence:No  Access to Firearms a concern: No  Gang Involvement:No   Subjective: Beginning the session, patient stated that she is doing well and talked about how she has been engaging socially with people. She processed thoughts and emotions related to completing her homework. From there, she voiced that she enjoys getting out. She identified a goal of getting to the [facility] multiple times a week. She identified people who could hold her accountable. She also identified barriers and how to overcome those barriers. She identified reasons to make changes. She processed thoughts and emotions. She was agreeable to homework and following up. She denied suicidal and homicidal ideation.    Interventions:  Worked on developing a therapeutic relationship with the patient using active listening and reflective statements. Provided emotional support using empathy and validation.  Reviewed the treatment plan with the patient. Praised the patient for doing well and explored what has assisted the patient. Praised the patient for completing homework. Reflected on how the patient felt about completing her assignment. Discussed next steps moving forward. Developed a SMART goal for going to [employer]. Used socratic questions to assist the patient. Assisted in problem solving. Challenged some of the thoughts expressed. Identified reasons to make changes. Explored ways that patient could remind self for reasons to make changes. Identified barriers and how to overcome those barriers. Processed thoughts and emotions. Identified people who could hold the patient accountable. Provided empathic statements. Assigned homework Assessed for suicidal and homicidal ideation.   Homework: SMART goal of going to the [facility] three times a week with a goal of twice per week  Next Session: Review homework and emotional support.   Diagnosis: F33.1 major depressive affective disorder, recurrent, moderate  Plan:   Goals Alleviate depressive symptoms Recognize, accept, and cope with depressive feelings Develop healthy thinking patterns Develop healthy interpersonal relationships  Objectives target date for all objectives is 04/13/2023 Cooperate with a medication evaluation by a physician Verbalize an accurate understanding of depression Verbalize an understanding of the treatment Identify and replace thoughts that support depression Learn and implement behavioral strategies Verbalize an understanding and resolution of current interpersonal problems Learn and implement problem solving and decision making skills Learn and implement conflict resolution skills to resolve interpersonal problems Verbalize an understanding of healthy and unhealthy emotions verbalize insight into how past relationships may be influence current experiences with depression Use mindfulness and acceptance strategies and increase  value based behavior  Increase hopeful statements  about the future.  Interventions Consistent with treatment model, discuss how change in cognitive, behavioral, and interpersonal can help client alleviate depression CBT Behavioral activation help the client explore the relationship, nature of the dispute,  Help the client develop new interpersonal skills and relationships Conduct Problem so living therapy Teach conflict resolution skills Use a process-experiential approach Conduct TLDP Conduct ACT Evaluate need for psychotropic medication Monitor adherence to medication   The patient and clinician reviewed the treatment plan on 04/26/2022. The patient approved of the treatment plan.   [name], PsyD

## 2022-09-02 NOTE — Progress Notes (Unsigned)
.  z

## 2022-09-02 NOTE — Patient Instructions (Addendum)
Medication Instructions:   *If you need a refill on your cardiac medications before your next appointment, please call your pharmacy*   Lab Work: nmr If you have labs (blood work) drawn today and your tests are completely normal, you will receive your results only by: [place] (if you have MyChart) OR A paper copy in the mail If you have any lab test that is abnormal or we need to change your treatment, we will call you to review the results.   Testing/Procedures:  [facility] [place]- Long Term Monitor Instructions  Your physician has requested you wear a ZIO patch monitor for 14 days.  This is a single patch monitor. [employer] supplies one patch monitor per enrollment. Additional stickers are not available. Please do not apply patch if you will be having a Nuclear Stress Test,  Echocardiogram, Cardiac CT, MRI, or Chest Xray during the period you would be wearing the  monitor. The patch cannot be worn during these tests. You cannot remove and re-apply the  ZIO XT patch monitor.  Your ZIO patch monitor will be mailed 3 day USPS to your address on file. It may take 3-5 days  to receive your monitor after you have been enrolled.  Once you have received your monitor, please review the enclosed instructions. Your monitor  has already been registered assigning a specific monitor serial # to you.  Billing and Patient Assistance Program Information  We have supplied [employer] with any of your insurance information on file for billing purposes. [employer] offers a sliding scale Patient Assistance Program for patients that do not have  insurance, or whose insurance does not completely cover the cost of the ZIO monitor.  You must apply for the Patient Assistance Program to qualify for this discounted rate.  To apply, please call [employer] at [phone], select option 4, select option 2, ask to apply for  Patient Assistance Program. Theodore Demark will ask your household income, and how many people  are in your  household. They will quote your out-of-pocket cost based on that information.  [employer] will also be able to set up a 43-month interest-free payment plan if needed.  Applying the monitor   Shave hair from upper left chest.  Hold abrader disc by orange tab. Rub abrader in 40 strokes over the upper left chest as  indicated in your monitor instructions.  Clean area with 4 enclosed alcohol pads. Let dry.  Apply patch as indicated in monitor instructions. Patch will be placed under collarbone on left  side of chest with arrow pointing upward.  Rub patch adhesive wings for 2 minutes. Remove white label marked "1". Remove the white  label marked "2". Rub patch adhesive wings for 2 additional minutes.  While looking in a mirror, press and release button in center of patch. A small green light will  flash 3-4 times. This will be your only indicator that the monitor has been turned on.  Do not shower for the first 24 hours. You may shower after the first 24 hours.  Press the button if you feel a symptom. You will hear a small click. Record Date, Time and  Symptom in the Patient Logbook.  When you are ready to remove the patch, follow instructions on the last 2 pages of Patient  Logbook. Stick patch monitor onto the last page of Patient Logbook.  Place Patient Logbook in the blue and white box. Use locking tab on box and tape box closed  securely. The blue and white box has  prepaid postage on it. Please place it in the mailbox as  soon as possible. Your physician should have your test results approximately 7 days after the  monitor has been mailed back to [facility].  Call [place] at [phone] if you have questions regarding  your ZIO XT patch monitor. Call them immediately if you see an orange light blinking on your  monitor.  If your monitor falls off in less than 4 days, contact our Monitor department at [phone].  If your monitor becomes loose or falls off after  4 days call [employer] at [phone] for  suggestions on securing your monitor     Follow-Up: At [facility] : [facility], you and your health needs are our priority.  As part of our continuing mission to provide you with exceptional heart care, we have created designated Provider Care Teams.  These Care Teams include your primary Cardiologist (physician) and Advanced Practice Providers (APPs -  Physician Assistants and Nurse Practitioners) who all work together to provide you with the care you need, when you need it.  We recommend signing up for the patient portal called "MyChart".  Sign up information is provided on this After Visit Summary.  MyChart is used to connect with patients for Virtual Visits (Telemedicine).  Patients are able to view lab/test results, encounter notes, upcoming appointments, etc.  Non-urgent messages can be sent to your provider as well.   To learn more about what you can do with MyChart, go to [email or web address].    Your next appointment:   8 month(s)  Provider:   Dorris Carnes, MD     Other Instructions

## 2022-09-04 LAB — NMR, LIPOPROFILE
Cholesterol, Total: 109 mg/dL (ref 100–199)
HDL Particle Number: 31.7 umol/L (ref >=30.5)
HDL-C: 59 mg/dL (ref >39)
LDL Particle Number: 363 nmol/L (ref <1000)
LDL Size: 20.4 nm — ABNORMAL LOW (ref >20.5)
LDL-C (NIH Calc): 32 mg/dL (ref 0–99)
LP-IR Score: <25 (ref <=45)
Small LDL Particle Number: 176 nmol/L (ref <=527)
Triglycerides: 98 mg/dL (ref 0–149)

## 2022-09-06 ENCOUNTER — Telehealth: Payer: Self-pay

## 2022-09-06 DIAGNOSIS — E1169 Type 2 diabetes mellitus with other specified complication: Secondary | ICD-10-CM

## 2022-09-06 NOTE — Telephone Encounter (Signed)
-----   Message from Fay Records, MD sent at 09/05/2022  3:26 PM EST ----- Lipids are excellent   Stop Zetia    Follow up lipomed in 4 months   May cut back further

## 2022-09-06 NOTE — Telephone Encounter (Signed)
Pt is returning call and is requesting return call.

## 2022-09-06 NOTE — Telephone Encounter (Signed)
The patient has been notified of the result and verbalized understanding.  All questions (if any) were answered. Bernestine Amass, RN 09/06/2022 4:46 PM  Labs have been scheduled.

## 2022-09-06 NOTE — Telephone Encounter (Signed)
Left a message for the pt to call back and result and message sent to the pts My Chart for their review.   NMR ordered and Zetia d/c'd from her med list...need lab date around 01/05/23.

## 2022-09-08 ENCOUNTER — Ambulatory Visit (INDEPENDENT_AMBULATORY_CARE_PROVIDER_SITE_OTHER): Payer: Medicare HMO

## 2022-09-08 VITALS — BP 122/62 | HR 66 | Temp 98.1°F | Ht 66.0 in | Wt 227.8 lb

## 2022-09-08 DIAGNOSIS — Z0000 Encounter for general adult medical examination without abnormal findings: Secondary | ICD-10-CM | POA: Diagnosis not present

## 2022-09-08 NOTE — Progress Notes (Signed)
Subjective:   [name] is a 77 y.o. female who presents for Medicare Annual (Subsequent) preventive examination.  Review of Systems     Cardiac Risk Factors include: advanced age (>51mn, >>73women);hypertension     Objective:    Today's Vitals   09/08/22 1407  BP: 122/62  Pulse: 66  Temp: 98.1 F (36.7 C)  TempSrc: Oral  SpO2: 95%  Weight: 227 lb 12.8 oz (103.3 kg)  Height: 5' 6"$  (1.676 m)   Body mass index is 36.77 kg/m.     09/08/2022    2:35 PM 06/14/2022    9:37 AM 06/10/2022    7:15 PM 04/13/2022    8:56 AM 04/07/2022   11:08 AM 08/28/2021    7:25 PM 08/27/2021   11:16 AM  Advanced Directives  Does Patient Have a Medical Advance Directive? Yes No No Yes Yes No Yes  Type of AParamedicof ACandelaria ArenasLiving will   HManyLiving will HSand ForkLiving will HAlexisLiving will HParkmanLiving will  Does patient want to make changes to medical advance directive?    No - Patient declined  No - Patient declined No - Patient declined  Copy of HCovingtonin Chart? No - copy requested   No - copy requested  No - copy requested No - copy requested  Would patient like information on creating a medical advance directive?   No - Patient declined   No - Patient declined     Current Medications (verified) Outpatient Encounter Medications as of 09/08/2022  Medication Sig   acetaminophen (TYLENOL) 500 MG tablet Take 1,000 mg by mouth as needed for moderate pain or headache.    albuterol (VENTOLIN HFA) 108 (90 Base) MCG/ACT inhaler INHALE ONE OR TWO PUFFS BY MOUTH INTO THE LUNGS EVERY SIX HOURS AS NEEDED FOR WHEEZING OR SHORTNESS OF BREATH   Alirocumab (PRALUENT) 75 MG/ML SOAJ Inject 75 mg into the skin every 14 (fourteen) days.   aspirin 81 MG chewable tablet Chew 81 mg by mouth daily.   Budeson-Glycopyrrol-Formoterol (BREZTRI AEROSPHERE) 160-9-4.8 MCG/ACT AERO  Inhale 2 puffs into the lungs in the morning and at bedtime.   budesonide-formoterol (SYMBICORT) 80-4.5 MCG/ACT inhaler Inhale 2 puffs then rinse mouth, twice daily   famotidine (PEPCID) 20 MG tablet TAKE ONE TABLET BY MOUTH EVERY MORNING and TAKE ONE TABLET BY MOUTH EVERYDAY AT BEDTIME   fluticasone (FLONASE) 50 MCG/ACT nasal spray PLACE 2 SPRAYS INTO BOTH NOSTRILS AT BEDTIME AS NEEDED FOR ALLERGIES.   gabapentin (NEURONTIN) 300 MG capsule Take 300 mg by mouth 2 (two) times daily.   hydrochlorothiazide (HYDRODIURIL) 25 MG tablet TAKE ONE TABLET BY MOUTH ONCE DAILY   losartan (COZAAR) 100 MG tablet TAKE ONE TABLET BY MOUTH EVERY MORNING   metoprolol succinate (TOPROL-XL) 100 MG 24 hr tablet TAKE ONE TABLET BY MOUTH AT NOON   montelukast (SINGULAIR) 10 MG tablet TAKE ONE TABLET BY MOUTH EVERYDAY AT BEDTIME   MYRBETRIQ 50 MG TB24 tablet Take 50 mg by mouth daily.   nitroGLYCERIN (NITROSTAT) 0.4 MG SL tablet Place 1 tablet (0.4 mg total) under the tongue every 5 (five) minutes as needed for chest pain.   ondansetron (ZOFRAN-ODT) 4 MG disintegrating tablet 435mODT q4 hours prn nausea/vomit   pantoprazole (PROTONIX) 40 MG tablet Take 1 tablet (40 mg total) by mouth daily.   rosuvastatin (CRESTOR) 20 MG tablet Take 1 tablet (20 mg total) by mouth at  bedtime.   sertraline (ZOLOFT) 50 MG tablet Take 1 tablet (50 mg total) by mouth daily.   traMADol (ULTRAM) 50 MG tablet Take 1 tablet (50 mg total) by mouth every 6 (six) hours as needed.   No facility-administered encounter medications on file as of 09/08/2022.    Allergies (verified) Oxycodone-acetaminophen, Percocet [oxycodone-acetaminophen], Pravastatin sodium, Hydrocodone, and Aspirin   History: Past Medical History:  Diagnosis Date   Allergy    Anxiety    C. difficile diarrhea    history of   CAD (coronary artery disease), native coronary artery    a. 05/2017 showed 100% mLCx tx with DES, otherwise 20% mRCA, 50% ramus, 30% dLAD, 20% prox-mid  LAD, 20% mLAD treated medically.    Cancer [facility])    Right kidney CA removed.    CKD (chronic kidney disease) stage 3, GFR 30-59 ml/min (HCC) 10/11/2016   s/p R nephrectomy   Colon polyps 2008   HYPERPLASTIC   COPD (chronic obstructive pulmonary disease) ([place])    former smoker   Gastritis    GERD (gastroesophageal reflux disease)    History of echocardiogram    Echo 3/18:  Moderate LVH, EF [id number], grade 1 diastolic dysfunction, calcified aortic valve, mild MR, moderate LAE   History of nuclear stress test    Myoview 3/18: Mod size and intensity fixed septal defect, may be artifact.Opposite mod size and intensity lat defect, which is reversible and could represent ischemia or possibly artifact (SDS 4). LVEF 71% with normal wall motion. Intermediate risk study. >> images reviewed with Dr. Dorris Carnes - no sig ischemia; med rx    Hx of cardiovascular stress test    Lexiscan Myoview 6/16:  EF 70%, no scar or ischemia; Low Risk   Hyperlipidemia    Hypertension    Neuropathy    Orthostatic hypotension 05/28/2017   OSA (obstructive sleep apnea) 01/16/2021   Osteoarthritis    Pre-diabetes    no meds   Rheumatoid arthritis (HCC)    RA (Dr. Ouida Sills) Bilateral hands   S/P angioplasty with stent 05/27/17 to LCX with DES  05/28/2017   Sleep apnea 2023   does not use CPAP   Thyroid disease    Past Surgical History:  Procedure Laterality Date   ABDOMINAL HYSTERECTOMY  1978   ANTERIOR CERVICAL DECOMP/DISCECTOMY FUSION N/A 05/29/2018   Procedure: ANTERIOR CERVICAL DECOMPRESSION FUSION - CERVICAL FIVE-CERVICAL SIX - CERVICAL SIX-CERVICAL SEVEN;  Surgeon: Earnie Larsson, MD;  Location: [place];  Service: Neurosurgery;  Laterality: N/A;   BACK SURGERY     x 2   BREAST LUMPECTOMY WITH RADIOACTIVE SEED LOCALIZATION Right 04/13/2022   Procedure: RIGHT BREAST SEED LUMPECTOMY;  Surgeon: Erroll Luna, MD;  Location: [place];  Service: General;  Laterality: Right;   CARPAL TUNNEL RELEASE  Left    COLONOSCOPY W/ BIOPSIES AND POLYPECTOMY     Hx: of   CORONARY STENT INTERVENTION N/A 05/27/2017   Procedure: CORONARY STENT INTERVENTION;  Surgeon: Burnell Blanks, MD;  Location: [place] CV LAB;  Service: Cardiovascular;  Laterality: N/A;   ESOPHAGOGASTRODUODENOSCOPY     HNP     LEFT HEART CATH AND CORONARY ANGIOGRAPHY N/A 05/27/2017   Procedure: LEFT HEART CATH AND CORONARY ANGIOGRAPHY;  Surgeon: Burnell Blanks, MD;  Location: [place] CV LAB;  Service: Cardiovascular;  Laterality: N/A;   LUMBAR LAMINECTOMY/DECOMPRESSION MICRODISCECTOMY Left 02/12/2013   Procedure: LUMBAR TWO THREE, LUMBAR THREE FOUR, LUMBAR FOUR FIVE  LAMINECTOMY/DECOMPRESSION MICRODISCECTOMY 3 LEVELS;  Surgeon: Cooper Render  Pool, MD;  Location: [place] NEURO ORS;  Service: Neurosurgery;  Laterality: Left;   NEPHRECTOMY Right 2010   10.rcc cancer   TOTAL KNEE ARTHROPLASTY Right    Redo   Family History  Problem Relation Age of Onset   Rheum arthritis Mother    Stroke Mother    Prostate cancer Father    Heart disease Father    Diabetes Brother    Dementia Brother    Parkinsonism Brother    Kidney disease Son    Diabetes Brother    Cancer Brother    Dementia Brother    Colon cancer Neg Hx    Esophageal cancer Neg Hx    Pancreatic cancer Neg Hx    Liver disease Neg Hx    Rectal cancer Neg Hx    Stomach cancer Neg Hx    Social History   Socioeconomic History   Marital status: Divorced    Spouse name: Not on file   Number of children: 3   Years of education: 12   Highest education level: Not on file  Occupational History   Occupation: Retired    Fish farm manager: RETIRED  Tobacco Use   Smoking status: Former    Packs/day: 0.20    Years: 15.00    Total pack years: 3.00    Types: Cigarettes    Start date: 1970    Quit date: 09/07/2021    Years since quitting: 1.0   Smokeless tobacco: Never   Tobacco comments:    pt has been an on and off smoker since age 56  Vaping Use   Vaping Use:  Never used  Substance and Sexual Activity   Alcohol use: No    Alcohol/week: 0.0 standard drinks of alcohol   Drug use: No   Sexual activity: Not Currently    Birth control/protection: Surgical    Comment: hyst  Other Topics Concern   Not on file  Social History Narrative   Single, dgtr lives with her   Current on/off smoker   Alcohol use- no   Drug use-no   Regular Exercise-yes   Social Determinants of Health   Financial Resource Strain: Low Risk  (09/08/2022)   Overall Financial Resource Strain (CARDIA)    Difficulty of Paying Living Expenses: Not hard at all  Food Insecurity: No Food Insecurity (09/08/2022)   Hunger Vital Sign    Worried About Running Out of Food in the Last Year: Never true    Ran Out of Food in the Last Year: Never true  Transportation Needs: No Transportation Needs (09/08/2022)   PRAPARE - Hydrologist (Medical): No    Lack of Transportation (Non-Medical): No  Physical Activity: Inactive (09/08/2022)   Exercise Vital Sign    Days of Exercise per Week: 0 days    Minutes of Exercise per Session: 0 min  Stress: Stress Concern Present (09/08/2022)   [place]    Feeling of Stress : To some extent  Social Connections: Moderately Integrated (09/08/2022)   Social Connection and Isolation Panel [NHANES]    Frequency of Communication with Friends and Family: More than three times a week    Frequency of Social Gatherings with Friends and Family: More than three times a week    Attends Religious Services: More than 4 times per year    Active Member of [employer] or Organizations: Yes    Attends Archivist Meetings: More than 4 times per year  Marital Status: Divorced    Tobacco Counseling Counseling given: Not Answered Tobacco comments: pt has been an on and off smoker since age 81   Clinical Intake:  Pre-visit preparation completed: No  Pain : No/denies  pain   Nutrition Risk Assessment:   BMI - recorded: 36.77 Nutritional Status: BMI > 30  Obese Nutritional Risks: None Diabetes: No  How often do you need to have someone help you when you read instructions, pamphlets, or other written materials from your doctor or pharmacy?: 1 - Never  Diabetic?  No Interpreter Needed?: No  Information entered by :: Rolene Arbour LPN   Activities of Daily Living    09/08/2022    2:32 PM 04/13/2022    9:00 AM  In your present state of health, do you have any difficulty performing the following activities:  Hearing? 0 0  Vision? 0 0  Difficulty concentrating or making decisions? 0 0  Walking or climbing stairs? 0 0  Dressing or bathing? 0 0  Doing errands, shopping? 0   Preparing Food and eating ? N   Using the Toilet? N   In the past six months, have you accidently leaked urine? Y   Comment Wears pad and breifs. Followed by Urologist   Do you have problems with loss of bowel control? Y   Comment Wears breifs, followed by Gastrologist   Managing your Medications? N   Managing your Finances? N   Housekeeping or managing your Housekeeping? N     Patient Care Team: [name], [name], MD as PCP - General (Family Medicine) Fay Records, MD as PCP - Cardiology (Cardiology) Viona Gilmore, [facility] (Inactive) as Pharmacist (Pharmacist)  Indicate any recent Medical Services you may have received from other than [facility] providers in the past year (date may be approximate).     Assessment:   This is a routine wellness examination for [facility].  Hearing/Vision screen Hearing Screening - Comments:: Denies hearing difficulties   Vision Screening - Comments:: Wears rx glasses - up to date with routine eye exams with  Coralie Keens  Dietary issues and exercise activities discussed: Exercise limited by: None identified   Goals Addressed               This Visit's Progress     Increase physical activity (pt-stated)        Would like to get back  to water exercises.       Depression Screen    09/08/2022    2:28 PM 09/08/2022    2:06 PM 05/31/2022   12:02 PM 03/24/2022    2:49 PM 01/22/2022    2:36 PM 11/25/2021   10:20 PM 08/27/2021   11:04 AM  PHQ 2/9 Scores  PHQ - 2 Score 2 0 3 2 0 2 2  PHQ- 9 Score 3  9 7 7 9 4    $ Fall Risk    09/08/2022    2:35 PM 05/31/2022   12:02 PM 03/24/2022    2:49 PM 01/22/2022    2:36 PM 08/27/2021   11:11 AM  Fall Risk   Falls in the past year? 0 0 0 0 0  Number falls in past yr: 0 0 0  0  Injury with Fall? 0 0 0 0 0  Risk for fall due to : No Fall Risks No Fall Risks Other (Comment) No Fall Risks   Follow up Falls prevention discussed Falls evaluation completed Falls evaluation completed      FALL RISK  PREVENTION PERTAINING TO THE HOME:  Any stairs in or around the home? No  If so, are there any without handrails? No  Home free of loose throw rugs in walkways, pet beds, electrical cords, etc? Yes  Adequate lighting in your home to reduce risk of falls? Yes   ASSISTIVE DEVICES UTILIZED TO PREVENT FALLS:  Life alert? No  Use of a cane, walker or w/c? Yes Grab bars in the bathroom? Yes  Shower chair or bench in shower? Yes  Elevated toilet seat or a handicapped toilet?  Yes    TIMED UP AND GO:  Was the test performed? Yes .  Length of time to ambulate 10 feet: 10 sec.   Gait steady and fast without use of assistive device  Cognitive Function:        09/08/2022    2:35 PM 08/27/2021   11:13 AM  6CIT Screen  What Year? 0 points 0 points  What month? 0 points 0 points  What time? 0 points 0 points  Count back from 20 0 points 0 points  Months in reverse 0 points 0 points  Repeat phrase 0 points 0 points  Total Score 0 points 0 points    Immunizations Immunization History  Administered Date(s) Administered   Fluad Quad(high Dose 65+) 05/05/2019, 06/09/2020, 04/17/2021, 04/20/2022   Influenza Split 05/23/2012, 06/07/2013   Influenza Whole 08/02/2005, 05/08/2007,  05/09/2008, 08/19/2009, 06/03/2010   Influenza, High Dose Seasonal PF 06/20/2014, 05/13/2015, 06/08/2016, 05/23/2017, 05/18/2018   PFIZER Comirnaty(Gray Top)Covid-19 Tri-Sucrose Vaccine 12/17/2020   Pfizer Covid-19 Vaccine Bivalent Booster 33yr & up 06/17/2021   Pneumococcal Conjugate-13 02/27/2007   Pneumococcal Polysaccharide-23 02/27/2007, 12/16/2011, 10/16/2014, 05/26/2017   Td 08/02/2001   Tdap 12/16/2011   Zoster, Live 12/27/2012    TDAP status: Due, Education has been provided regarding the importance of this vaccine. Advised may receive this vaccine at local pharmacy or Health Dept. Aware to provide a copy of the vaccination record if obtained from local pharmacy or Health Dept. Verbalized acceptance and understanding.  Flu Vaccine status: Up to date  Pneumococcal vaccine status: Up to date  Covid-19 vaccine status: Completed vaccines  Qualifies for Shingles Vaccine? Yes   Zostavax completed No   Shingrix Completed?: No.    Education has been provided regarding the importance of this vaccine. Patient has been advised to call insurance company to determine out of pocket expense if they have not yet received this vaccine. Advised may also receive vaccine at local pharmacy or Health Dept. Verbalized acceptance and understanding.  Screening Tests Health Maintenance  Topic Date Due   DTaP/Tdap/Td (3 - Td or Tdap) 12/15/2021   FOOT EXAM  08/19/2022   OPHTHALMOLOGY EXAM  09/08/2022 (Originally 04/28/2022)   Diabetic kidney evaluation - Urine ACR  09/09/2022 (Originally 03/26/2021)   COVID-19 Vaccine (3 - Pfizer risk series) 09/24/2022 (Originally 07/15/2021)   Zoster Vaccines- Shingrix (1 of 2) 12/07/2022 (Originally 01/22/1965)   HEMOGLOBIN A1C  09/24/2022   MAMMOGRAM  01/16/2023   COLONOSCOPY (Pts 45-420yrInsurance coverage will need to be confirmed)  02/05/2023   Diabetic kidney evaluation - eGFR measurement  07/06/2023   Medicare Annual Wellness (AWV)  09/09/2023   Pneumonia  Vaccine 6564Years old  Completed   INFLUENZA VACCINE  Completed   DEXA SCAN  Completed   Hepatitis C Screening  Completed   HPV VACCINES  Aged Out    Health Maintenance  Health Maintenance Due  Topic Date Due   DTaP/Tdap/Td (3 - Td or Tdap)  12/15/2021   FOOT EXAM  08/19/2022    Colorectal cancer screening: Type of screening: Colonoscopy. Completed 02/05/20. Repeat every 3 years  Mammogram status: Completed 01/15/22. Repeat every year  Bone Density status: Completed 04/10/19. Results reflect: Bone density results: OSTEOPOROSIS. Repeat every   years.  Lung Cancer Screening: (Low Dose CT Chest recommended if Age 53-80 years, 30 pack-year currently smoking OR have quit w/in 15years.) does not qualify.     Additional Screening:  Hepatitis C Screening: does qualify; Completed 08/16/18  Vision Screening: Recommended annual ophthalmology exams for early detection of glaucoma and other disorders of the eye. Is the patient up to date with their annual eye exam?  Yes  Who is the provider or what is the name of the office in which the patient attends annual eye exams? [facility] If pt is not established with a provider, would they like to be referred to a provider to establish care? No .   Dental Screening: Recommended annual dental exams for proper oral hygiene  Community Resource Referral / Chronic Care Management:  CRR required this visit?  No   CCM required this visit?  No      Plan:     I have personally reviewed and noted the following in the patient's chart:   Medical and social history Use of alcohol, tobacco or illicit drugs  Current medications and supplements including opioid prescriptions. Patient is currently taking opioid prescriptions. Information provided to patient regarding non-opioid alternatives. Patient advised to discuss non-opioid treatment plan with their provider. Functional ability and status Nutritional status Physical activity Advanced  directives List of other physicians Hospitalizations, surgeries, and ER visits in previous 12 months Vitals Screenings to include cognitive, depression, and falls Referrals and appointments  In addition, I have reviewed and discussed with patient certain preventive protocols, quality metrics, and best practice recommendations. A written personalized care plan for preventive services as well as general preventive health recommendations were provided to patient.     Criselda Peaches, LPN   [id number]   Nurse Notes: Patient due Diabetic kidney evaluation-Urine ACR

## 2022-09-08 NOTE — Patient Instructions (Addendum)
[name] , Thank you for taking time to come for your Medicare Wellness Visit. I appreciate your ongoing commitment to your health goals. Please review the following plan we discussed and let me know if I can assist you in the future.   These are the goals we discussed:  Goals       Exercise 3x per week (30 min per time)      Increase physical activity (pt-stated)      Would like to get back to water exercises.      Manage My Medicine      Timeframe:  Long-Range Goal Priority:  Medium Start Date:                             Expected End Date:                       Follow Up Date 08/31/21    - call for medicine refill 2 or 3 days before it runs out - keep a list of all the medicines I take; vitamins and herbals too - use a pillbox to sort medicine - use an alarm clock or phone to remind me to take my medicine    Why is this important?   These steps will help you keep on track with your medicines.   Notes:       Weight (lb) < 200 lb (90.7 kg)        This is a list of the screening recommended for you and due dates:  Health Maintenance  Topic Date Due   DTaP/Tdap/Td vaccine (3 - Td or Tdap) 12/15/2021   Complete foot exam   08/19/2022   Eye exam for diabetics  09/08/2022*   Yearly kidney health urinalysis for diabetes  09/09/2022*   COVID-19 Vaccine (3 - Pfizer risk series) 09/24/2022*   Zoster (Shingles) Vaccine (1 of 2) 12/07/2022*   Hemoglobin A1C  09/24/2022   Mammogram  01/16/2023   Colon Cancer Screening  02/05/2023   Yearly kidney function blood test for diabetes  07/06/2023   Medicare Annual Wellness Visit  09/09/2023   Pneumonia Vaccine  Completed   Flu Shot  Completed   DEXA scan (bone density measurement)  Completed   Hepatitis C Screening: USPSTF Recommendation to screen - Ages 28-79 yo.  Completed   HPV Vaccine  Aged Out  *Topic was postponed. The date shown is not the original due date.  Opioid Pain Medicine Management Opioids are powerful medicines that  are used to treat moderate to severe pain. When used for short periods of time, they can help you to: Sleep better. Do better in physical or occupational therapy. Feel better in the first few days after an injury. Recover from surgery. Opioids should be taken with the supervision of a trained health care provider. They should be taken for the shortest period of time possible. This is because opioids can be addictive, and the longer you take opioids, the greater your risk of addiction. This addiction can also be called opioid use disorder. What are the risks? Using opioid pain medicines for longer than 3 days increases your risk of side effects. Side effects include: Constipation. Nausea and vomiting. Breathing difficulties (respiratory depression). Drowsiness. Confusion. Opioid use disorder. Itching. Taking opioid pain medicine for a long period of time can affect your ability to do daily tasks. It also puts you at risk for: Motor vehicle crashes. Depression.  Suicide. Heart attack. Overdose, which can be life-threatening. What is a pain treatment plan? A pain treatment plan is an agreement between you and your health care provider. Pain is unique to each person, and treatments vary depending on your condition. To manage your pain, you and your health care provider need to work together. To help you do this: Discuss the goals of your treatment, including how much pain you might expect to have and how you will manage the pain. Review the risks and benefits of taking opioid medicines. Remember that a good treatment plan uses more than one approach and minimizes the chance of side effects. Be honest about the amount of medicines you take and about any drug or alcohol use. Get pain medicine prescriptions from only one health care provider. Pain can be managed with many types of alternative treatments. Ask your health care provider to refer you to one or more specialists who can help you manage  pain through: Physical or occupational therapy. Counseling (cognitive behavioral therapy). Good nutrition. Biofeedback. Massage. Meditation. Non-opioid medicine. Following a gentle exercise program. How to use opioid pain medicine Taking medicine Take your pain medicine exactly as told by your health care provider. Take it only when you need it. If your pain gets less severe, you may take less than your prescribed dose if your health care provider approves. If you are not having pain, do nottake pain medicine unless your health care provider tells you to take it. If your pain is severe, do nottry to treat it yourself by taking more pills than instructed on your prescription. Contact your health care provider for help. Write down the times when you take your pain medicine. It is easy to become confused while on pain medicine. Writing the time can help you avoid overdose. Take other over-the-counter or prescription medicines only as told by your health care provider. Keeping yourself and others safe  While you are taking opioid pain medicine: Do not drive, use machinery, or power tools. Do not sign legal documents. Do not drink alcohol. Do not take sleeping pills. Do not supervise children by yourself. Do not do activities that require climbing or being in high places. Do not go to a lake, river, ocean, spa, or swimming pool. Do not share your pain medicine with anyone. Keep pain medicine in a locked cabinet or in a secure area where pets and children cannot reach it. Stopping your use of opioids If you have been taking opioid medicine for more than a few weeks, you may need to slowly decrease (taper) how much you take until you stop completely. Tapering your use of opioids can decrease your risk of symptoms of withdrawal, such as: Pain and cramping in the abdomen. Nausea. Sweating. Sleepiness. Restlessness. Uncontrollable shaking (tremors). Cravings for the medicine. Do not  attempt to taper your use of opioids on your own. Talk with your health care provider about how to do this. Your health care provider may prescribe a step-down schedule based on how much medicine you are taking and how long you have been taking it. Getting rid of leftover pills Do not save any leftover pills. Get rid of leftover pills safely by: Taking the medicine to a prescription take-back program. This is usually offered by the county or law enforcement. Bringing them to a pharmacy that has a drug disposal container. Flushing them down the toilet. Check the label or package insert of your medicine to see whether this is safe to do. Throwing them out  in the trash. Check the label or package insert of your medicine to see whether this is safe to do. If it is safe to throw it out, remove the medicine from the original container, put it into a sealable bag or container, and mix it with used coffee grounds, food scraps, dirt, or cat litter before putting it in the trash. Follow these instructions at home: Activity Do exercises as told by your health care provider. Avoid activities that make your pain worse. Return to your normal activities as told by your health care provider. Ask your health care provider what activities are safe for you. General instructions You may need to take these actions to prevent or treat constipation: Drink enough fluid to keep your urine pale yellow. Take over-the-counter or prescription medicines. Eat foods that are high in fiber, such as beans, whole grains, and fresh fruits and vegetables. Limit foods that are high in fat and processed sugars, such as fried or sweet foods. Keep all follow-up visits. This is important. Where to find support If you have been taking opioids for a long time, you may benefit from receiving support for quitting from a local support group or counselor. Ask your health care provider for a referral to these resources in your area. Where to  find more information Centers for Disease Control and Prevention (CDC): http://www.wolf.info/ [place] Food and Drug Administration (FDA): [place].[employer] Get help right away if: You may have taken too much of an opioid (overdosed). Common symptoms of an overdose: Your breathing is slower or more shallow than normal. You have a very slow heartbeat (pulse). You have slurred speech. You have nausea and vomiting. Your pupils become very small. You have other potential symptoms: You are very confused. You faint or feel like you will faint. You have cold, clammy skin. You have blue lips or fingernails. You have thoughts of harming yourself or harming others. These symptoms may represent a serious problem that is an emergency. Do not wait to see if the symptoms will go away. Get medical help right away. Call your local emergency services (911 in the [place] Do not drive yourself to the hospital.  If you ever feel like you may hurt yourself or others, or have thoughts about taking your own life, get help right away. Go to your nearest emergency department or: Call your local emergency services (911 in the [place] Call the [facility] [phone] in the [place] Call a suicide crisis helpline, such as the [place] at 318-601-3525 or 988 in the [place]. This is open 24 hours a day in the [place] Text the Crisis Text Line at (781) 597-2721 (in the [place].). Summary Opioid medicines can help you manage moderate to severe pain for a short period of time. A pain treatment plan is an agreement between you and your health care provider. Discuss the goals of your treatment, including how much pain you might expect to have and how you will manage the pain. If you think that you or someone else may have taken too much of an opioid, get medical help right away. This information is not intended to replace advice given to you by your health care provider. Make sure you discuss any questions you  have with your health care provider. Document Revised: 02/11/2021 Document Reviewed: 10/29/2020 Elsevier Patient Education  [place] directives: Please bring a copy of your health care power of attorney and living will to the office to be added to  your chart at your convenience.   Conditions/risks identified: None  Next appointment: Follow up in one year for your annual wellness visit     Preventive Care 65 Years and Older, Female Preventive care refers to lifestyle choices and visits with your health care provider that can promote health and wellness. What does preventive care include? A yearly physical exam. This is also called an annual well check. Dental exams once or twice a year. Routine eye exams. Ask your health care provider how often you should have your eyes checked. Personal lifestyle choices, including: Daily care of your teeth and gums. Regular physical activity. Eating a healthy diet. Avoiding tobacco and drug use. Limiting alcohol use. Practicing safe sex. Taking low-dose aspirin every day. Taking vitamin and mineral supplements as recommended by your health care provider. What happens during an annual well check? The services and screenings done by your health care provider during your annual well check will depend on your age, overall health, lifestyle risk factors, and family history of disease. Counseling  Your health care provider may ask you questions about your: Alcohol use. Tobacco use. Drug use. Emotional well-being. Home and relationship well-being. Sexual activity. Eating habits. History of falls. Memory and ability to understand (cognition). Work and work Statistician. Reproductive health. Screening  You may have the following tests or measurements: Height, weight, and BMI. Blood pressure. Lipid and cholesterol levels. These may be checked every 5 years, or more frequently if you are over 6 years old. Skin check. Lung  cancer screening. You may have this screening every year starting at age 89 if you have a 30-pack-year history of smoking and currently smoke or have quit within the past 15 years. Fecal occult blood test (FOBT) of the stool. You may have this test every year starting at age 53. Flexible sigmoidoscopy or colonoscopy. You may have a sigmoidoscopy every 5 years or a colonoscopy every 10 years starting at age 40. Hepatitis C blood test. Hepatitis B blood test. Sexually transmitted disease (STD) testing. Diabetes screening. This is done by checking your blood sugar (glucose) after you have not eaten for a while (fasting). You may have this done every 1-3 years. Bone density scan. This is done to screen for osteoporosis. You may have this done starting at age 23. Mammogram. This may be done every 1-2 years. Talk to your health care provider about how often you should have regular mammograms. Talk with your health care provider about your test results, treatment options, and if necessary, the need for more tests. Vaccines  Your health care provider may recommend certain vaccines, such as: Influenza vaccine. This is recommended every year. Tetanus, diphtheria, and acellular pertussis (Tdap, Td) vaccine. You may need a Td booster every 10 years. Zoster vaccine. You may need this after age 92. Pneumococcal 13-valent conjugate (PCV13) vaccine. One dose is recommended after age 71. Pneumococcal polysaccharide (PPSV23) vaccine. One dose is recommended after age 15. Talk to your health care provider about which screenings and vaccines you need and how often you need them. This information is not intended to replace advice given to you by your health care provider. Make sure you discuss any questions you have with your health care provider. Document Released: 08/15/2015 Document Revised: 04/07/2016 Document Reviewed: 05/20/2015 Elsevier Interactive Patient Education  2017 [place] Prevention in  the Home Falls can cause injuries. They can happen to people of all ages. There are many things you can do to make your home safe and  to help prevent falls. What can I do on the outside of my home? Regularly fix the edges of walkways and driveways and fix any cracks. Remove anything that might make you trip as you walk through a door, such as a raised step or threshold. Trim any bushes or trees on the path to your home. Use bright outdoor lighting. Clear any walking paths of anything that might make someone trip, such as rocks or tools. Regularly check to see if handrails are loose or broken. Make sure that both sides of any steps have handrails. Any raised decks and porches should have guardrails on the edges. Have any leaves, snow, or ice cleared regularly. Use sand or salt on walking paths during winter. Clean up any spills in your garage right away. This includes oil or grease spills. What can I do in the bathroom? Use night lights. Install grab bars by the toilet and in the tub and shower. Do not use towel bars as grab bars. Use non-skid mats or decals in the tub or shower. If you need to sit down in the shower, use a plastic, non-slip stool. Keep the floor dry. Clean up any water that spills on the floor as soon as it happens. Remove soap buildup in the tub or shower regularly. Attach bath mats securely with double-sided non-slip rug tape. Do not have throw rugs and other things on the floor that can make you trip. What can I do in the bedroom? Use night lights. Make sure that you have a light by your bed that is easy to reach. Do not use any sheets or blankets that are too big for your bed. They should not hang down onto the floor. Have a firm chair that has side arms. You can use this for support while you get dressed. Do not have throw rugs and other things on the floor that can make you trip. What can I do in the kitchen? Clean up any spills right away. Avoid walking on wet  floors. Keep items that you use a lot in easy-to-reach places. If you need to reach something above you, use a strong step stool that has a grab bar. Keep electrical cords out of the way. Do not use floor polish or wax that makes floors slippery. If you must use wax, use non-skid floor wax. Do not have throw rugs and other things on the floor that can make you trip. What can I do with my stairs? Do not leave any items on the stairs. Make sure that there are handrails on both sides of the stairs and use them. Fix handrails that are broken or loose. Make sure that handrails are as long as the stairways. Check any carpeting to make sure that it is firmly attached to the stairs. Fix any carpet that is loose or worn. Avoid having throw rugs at the top or bottom of the stairs. If you do have throw rugs, attach them to the floor with carpet tape. Make sure that you have a light switch at the top of the stairs and the bottom of the stairs. If you do not have them, ask someone to add them for you. What else can I do to help prevent falls? Wear shoes that: Do not have high heels. Have rubber bottoms. Are comfortable and fit you well. Are closed at the toe. Do not wear sandals. If you use a stepladder: Make sure that it is fully opened. Do not climb a closed stepladder. Make sure  that both sides of the stepladder are locked into place. Ask someone to hold it for you, if possible. Clearly mark and make sure that you can see: Any grab bars or handrails. First and last steps. Where the edge of each step is. Use tools that help you move around (mobility aids) if they are needed. These include: Canes. Walkers. Scooters. Crutches. Turn on the lights when you go into a dark area. Replace any light bulbs as soon as they burn out. Set up your furniture so you have a clear path. Avoid moving your furniture around. If any of your floors are uneven, fix them. If there are any pets around you, be aware of  where they are. Review your medicines with your doctor. Some medicines can make you feel dizzy. This can increase your chance of falling. Ask your doctor what other things that you can do to help prevent falls. This information is not intended to replace advice given to you by your health care provider. Make sure you discuss any questions you have with your health care provider. Document Released: 05/15/2009 Document Revised: 12/25/2015 Document Reviewed: 08/23/2014 Elsevier Interactive Patient Education  2017 [employer].

## 2022-09-17 ENCOUNTER — Telehealth: Payer: Self-pay

## 2022-09-17 NOTE — Progress Notes (Signed)
Care Management & Coordination Services Pharmacy Team  Reason for Encounter: Medication coordination and delivery  Contacted patient to discuss medications and coordinate delivery from [facility]. Spoke with patient on 09/17/2022   Cycle dispensing form sent to [facility] for review.   Last adherence delivery date:08/31/2022      Patient is due for next adherence delivery on: 09/30/2022  This delivery to include: Adherence Packaging  30 Days  Praluent Inj 75 mg - inject 75 mg every 14 days (healthwell approved through 08/23/23) Pantoprazole 40 mg - 1 tablet at breakfast Sertraline 50 mg - take one tablet at lunch Famotidine 20 mg - take one tablet at breakfast and one tablet at bedtime Rosuvastatin 20 mg - take one tablet at bedtime Metoprolol Suc 100 mg - take one tablet at lunch Losartan 100 mg - take one tablet at breakfast HCTZ 25 mg - take one tablet at breakfast Myrbetriq 50 mg - take one tablet at breakfast  Patient declined the following medications this month: None  Confirmed delivery date of 09/30/2022, advised patient that pharmacy will contact them the morning of delivery.  Any concerns about your medications?  Patient denies  How often do you forget or accidentally miss a dose? Patient denies any missed doses  Is patient in packaging Yes  If yes  What is the date on your next pill pack? 09/19/2022 (patient is unsure how she got ahead, she states her packs have been like this for several months)  Any concerns or issues with your packaging? Patient denies.  Recent blood pressure readings are as follows:  09/13/2022 patients BP was 120/70  Chart review: Recent office visits:  09/08/2022 Rolene Arbour LPN - Medicare annual wellness exam  Recent consult visits:  09/02/2022 Dorris Carnes MD (cardiology) - Patient was seen for Hyperlipidemia associated with type 2 diabetes mellitus and fluttering heart. No medication changes.   09/02/2022 [facility] - Patient was seen for Major depressive disorder, recurrent episode, moderate . No medication changes.   08/20/2022 Vito Cirigliano DO (GI) - Patient was seen for Diarrhea, unspecified type and additional concerns. Discontinued Miralax.  Hospital visits:  None  Medications: Outpatient Encounter Medications as of 09/17/2022  Medication Sig   acetaminophen (TYLENOL) 500 MG tablet Take 1,000 mg by mouth as needed for moderate pain or headache.    albuterol (VENTOLIN HFA) 108 (90 Base) MCG/ACT inhaler INHALE ONE OR TWO PUFFS BY MOUTH INTO THE LUNGS EVERY SIX HOURS AS NEEDED FOR WHEEZING OR SHORTNESS OF BREATH   Alirocumab (PRALUENT) 75 MG/ML SOAJ Inject 75 mg into the skin every 14 (fourteen) days.   aspirin 81 MG chewable tablet Chew 81 mg by mouth daily.   Budeson-Glycopyrrol-Formoterol (BREZTRI AEROSPHERE) 160-9-4.8 MCG/ACT AERO Inhale 2 puffs into the lungs in the morning and at bedtime.   budesonide-formoterol (SYMBICORT) 80-4.5 MCG/ACT inhaler Inhale 2 puffs then rinse mouth, twice daily   famotidine (PEPCID) 20 MG tablet TAKE ONE TABLET BY MOUTH EVERY MORNING and TAKE ONE TABLET BY MOUTH EVERYDAY AT BEDTIME   fluticasone (FLONASE) 50 MCG/ACT nasal spray PLACE 2 SPRAYS INTO BOTH NOSTRILS AT BEDTIME AS NEEDED FOR ALLERGIES.   gabapentin (NEURONTIN) 300 MG capsule Take 300 mg by mouth 2 (two) times daily.   hydrochlorothiazide (HYDRODIURIL) 25 MG tablet TAKE ONE TABLET BY MOUTH ONCE DAILY   losartan (COZAAR) 100 MG tablet TAKE ONE TABLET BY MOUTH EVERY MORNING   metoprolol succinate (TOPROL-XL) 100 MG 24 hr tablet TAKE ONE TABLET BY MOUTH AT  NOON   montelukast (SINGULAIR) 10 MG tablet TAKE ONE TABLET BY MOUTH EVERYDAY AT BEDTIME   MYRBETRIQ 50 MG TB24 tablet Take 50 mg by mouth daily.   nitroGLYCERIN (NITROSTAT) 0.4 MG SL tablet Place 1 tablet (0.4 mg total) under the tongue every 5 (five) minutes as needed for chest pain.   ondansetron (ZOFRAN-ODT) 4 MG disintegrating tablet 47m ODT q4  hours prn nausea/vomit   pantoprazole (PROTONIX) 40 MG tablet Take 1 tablet (40 mg total) by mouth daily.   rosuvastatin (CRESTOR) 20 MG tablet Take 1 tablet (20 mg total) by mouth at bedtime.   sertraline (ZOLOFT) 50 MG tablet Take 1 tablet (50 mg total) by mouth daily.   traMADol (ULTRAM) 50 MG tablet Take 1 tablet (50 mg total) by mouth every 6 (six) hours as needed.   No facility-administered encounter medications on file as of 09/17/2022.   BP Readings from Last 3 Encounters:  09/08/22 122/62  09/02/22 136/80  08/20/22 110/70    Pulse Readings from Last 3 Encounters:  09/08/22 66  09/02/22 60  08/20/22 69    Lab Results  Component Value Date/Time   HGBA1C 6.3 03/24/2022 03:14 PM   HGBA1C 5.9 04/17/2021 12:47 PM   Lab Results  Component Value Date   CREATININE 1.65 (H) 07/05/2022   BUN 19 07/05/2022   GFR 29.60 (L) 07/05/2022   GFRNONAA 34 (L) 06/14/2022   GFRAA 40 (L) 07/07/2020   NA 139 07/05/2022   K 3.6 07/05/2022   CALCIUM 9.6 07/05/2022   CO2 29 07/05/2022   JSpring GreenPharmacist Assistant [phone]

## 2022-09-18 DIAGNOSIS — G4733 Obstructive sleep apnea (adult) (pediatric): Secondary | ICD-10-CM | POA: Diagnosis not present

## 2022-09-20 ENCOUNTER — Other Ambulatory Visit: Payer: Self-pay | Admitting: Neurology

## 2022-09-20 ENCOUNTER — Telehealth: Payer: Self-pay | Admitting: Diagnostic Neuroimaging

## 2022-09-20 MED ORDER — GABAPENTIN 300 MG PO CAPS: 300.0000 mg | ORAL_CAPSULE | Freq: Two times a day (BID) | ORAL | 0 refills | Status: DC

## 2022-09-20 NOTE — Telephone Encounter (Signed)
Sent a one month supply in for the patient via the work in MD. Will send future refills once confirmed ok to continue.

## 2022-09-20 NOTE — Telephone Encounter (Signed)
Pt is asking that Dr Leta Baptist starts her back on gabapentin (NEURONTIN) 300 MG capsule , she is asking it be called into [facility]

## 2022-09-20 NOTE — Telephone Encounter (Signed)
Dr Leta Baptist is out of the office today. Per his last ov note, he discuss potentially adding it back for the patient if she felt she needed again could add. Prior to stopping the patient was on gabapentin 300 mg 1-2 capsules a day. I will ask if work in is willing to send in for the patient since Dr Leta Baptist is out.

## 2022-09-21 ENCOUNTER — Ambulatory Visit: Payer: Medicare HMO | Admitting: Psychologist

## 2022-09-21 DIAGNOSIS — F331 Major depressive disorder, recurrent, moderate: Secondary | ICD-10-CM

## 2022-09-21 DIAGNOSIS — R69 Illness, unspecified: Secondary | ICD-10-CM | POA: Diagnosis not present

## 2022-09-21 NOTE — Progress Notes (Signed)
[facility] Counselor/Therapist Progress Note  Patient ID: [name], MRN: [id number],    Date: 09/21/2022  Time Spent: 02:10 pm to 02:48 pm; total time: 38 minutes   This session was held via in person. The patient consented to in-person therapy and was in the clinician's office. Limits of confidentiality were discussed with the patient.   Treatment Type: Individual Therapy  Reported Symptoms: Stated that she is doing better. Still a little depressed  Mental Status Exam: Appearance:  Well Groomed     Behavior: Appropriate  Motor: Normal  Speech/Language:  Clear and Coherent  Affect: Appropriate  Mood: normal  Thought process: normal  Thought content:   WNL  Sensory/Perceptual disturbances:   WNL  Orientation: oriented to person, place, and time/date  Attention: Good  Concentration: Good  Memory: WNL  Fund of knowledge:  Good  Insight:   Fair  Judgment:  Good  Impulse Control: Good   Risk Assessment: Danger to Self:  No Self-injurious Behavior: No Danger to Others: No Duty to Warn:no Physical Aggression / Violence:No  Access to Firearms a concern: No  Gang Involvement:No   Subjective: Beginning the session, patient stated that she is doing better and that she completed the homework assignment. Patient voiced that she still experiences some depression. Patient then spent the session processing the theme of the church. Specifically, she reflected on whether or not to continue attending current church or to look for other churches. She processed thoughts and emotions. She asked to follow up. She denied suicidal and homicidal ideation.    Interventions:  Worked on developing a therapeutic relationship with the patient using active listening and reflective statements. Provided emotional support using empathy and validation. Reviewed the treatment plan with the patient. Used summary statements. Praised the patient for completing the homework assignment and  explored how the assignment assisted the patient. Processed thoughts and emotions. Identified goals for the session. Identified the theme of the church.  Normalized and validated thoughts expressed. Used socratic questions to assist the patient. Challenged some of the thoughts expressed. Used metaphors and analogies to assist the patient. Used self-disclosure to assist the patient. Assisted in problem solving. Provided empathic statements. Assessed for suicidal and homicidal ideation.   Homework: NA  Next Session: Reflect on what the patient wants to do regarding the church situation  Diagnosis: F33.1 major depressive affective disorder, recurrent, moderate  Plan:   Goals Alleviate depressive symptoms Recognize, accept, and cope with depressive feelings Develop healthy thinking patterns Develop healthy interpersonal relationships  Objectives target date for all objectives is 04/13/2023 Cooperate with a medication evaluation by a physician Verbalize an accurate understanding of depression Verbalize an understanding of the treatment Identify and replace thoughts that support depression Learn and implement behavioral strategies Verbalize an understanding and resolution of current interpersonal problems Learn and implement problem solving and decision making skills Learn and implement conflict resolution skills to resolve interpersonal problems Verbalize an understanding of healthy and unhealthy emotions verbalize insight into how past relationships may be influence current experiences with depression Use mindfulness and acceptance strategies and increase value based behavior  Increase hopeful statements about the future.  Interventions Consistent with treatment model, discuss how change in cognitive, behavioral, and interpersonal can help client alleviate depression CBT Behavioral activation help the client explore the relationship, nature of the dispute,  Help the client develop new  interpersonal skills and relationships Conduct Problem so living therapy Teach conflict resolution skills Use a process-experiential approach Conduct TLDP Conduct ACT Evaluate  need for psychotropic medication Monitor adherence to medication   The patient and clinician reviewed the treatment plan on 04/26/2022. The patient approved of the treatment plan.   [name], PsyD

## 2022-09-24 ENCOUNTER — Other Ambulatory Visit: Payer: Self-pay | Admitting: Internal Medicine

## 2022-09-24 DIAGNOSIS — E785 Hyperlipidemia, unspecified: Secondary | ICD-10-CM

## 2022-09-24 NOTE — Progress Notes (Unsigned)
HPI: [name] is a 77 y.o. female, who is here today with her daughter for chronic disease management.  Last seen on 05/25/23. No new problems since her last visit. She mentions that she has to resume gabapentin for polyneuropathy because of worsening symptoms after discontinuing the medication. She follows with neurologist, Dr. Leta Blackwell.  Anxiety and depression: She is on Sertraline 50 mg daily and attending psychotherapy sessions every three weeks. She mentions that the medication is helping, but she still experiences days with low motivation. Her daughter is concerned about depression being a sign of "dementia", she has not noted memory difficulties.  COPD and OSA: She follows with pulmonologist. She is on Symbicort 80-4.5 mcg 2 puffs twice daily and albuterol inhaler 2 puff every 4-6 hours as needed. She is not having cough, wheezing, or dyspnea.  She sleeps for an average of six to seven hours and is getting used to her CPAP machine. Has noted mild improvement in fatigue.  CKD III, she follows with nephrologist, next appt 10/15/22. She has not noted gross hematuria, form in urine, or decreased urine output. Hypertension: Currently she is on metoprolol succinate 100 mg daily, losartan 100 mg daily, and HCTZ 25 mg daily. Negative for severe/frequent headache, visual changes, chest pain, dyspnea, palpitation, focal weakness, or edema.  Lab Results  Component Value Date   CREATININE 1.65 (H) 07/05/2022   BUN 19 07/05/2022   NA 139 07/05/2022   K 3.6 07/05/2022   CL 102 07/05/2022   CO2 29 07/05/2022  CAD and PAD, she follows with cardiologist. She reports that anxiety has been discontinued. Currently she is on Crestor 20 mg daily and Praluent 75 mg every 2 weeks. Lab Results  Component Value Date   CHOL 143 09/11/2020   HDL 56 09/11/2020   LDLCALC 69 09/11/2020   LDLDIRECT 171.5 12/06/2011   TRIG 98 09/11/2020   CHOLHDL 2.6 09/11/2020   Today she reports a  "swollen" area on her hip with a "knot" in right buttock, and right posterior hip pain for the past few weeks. Negative for history of trauma. She has not noted erythema on affected area. She has not tried OTC medications.  DM II: *** On nonpharmacologic treatment. She is not checking BS. She is trying to follow dietary recommendations. She is not exercising regularly. Eye exam: Earlier this month - Negative for symptoms of hypoglycemia, polyuria, polydipsia, foot ulcers/trauma Last hemoglobin A1c 6.3 in 03/2022.  Lab Results  Component Value Date   MICROALBUR 5.8 03/26/2020   History of right renal cell carcinoma, status post nephrectomy in 2010.  She follows with urologist annually.  Review of Systems  Constitutional:  Positive for fatigue.  HENT:  Negative for mouth sores and sore throat.   See other pertinent positives and negatives in HPI.  Current Outpatient Medications on File Prior to Visit  Medication Sig Dispense Refill  . acetaminophen (TYLENOL) 500 MG tablet Take 1,000 mg by mouth as needed for moderate pain or headache.     . albuterol (VENTOLIN HFA) 108 (90 Base) MCG/ACT inhaler INHALE ONE OR TWO PUFFS BY MOUTH INTO THE LUNGS EVERY SIX HOURS AS NEEDED FOR WHEEZING OR SHORTNESS OF BREATH 8.5 g 12  . aspirin 81 MG chewable tablet Chew 81 mg by mouth daily.    . Budeson-Glycopyrrol-Formoterol (BREZTRI AEROSPHERE) 160-9-4.8 MCG/ACT AERO Inhale 2 puffs into the lungs in the morning and at bedtime. 2 each 11  . budesonide-formoterol (SYMBICORT) 80-4.5 MCG/ACT inhaler Inhale 2 puffs then  rinse mouth, twice daily 1 each 12  . famotidine (PEPCID) 20 MG tablet TAKE ONE TABLET BY MOUTH EVERY MORNING and TAKE ONE TABLET BY MOUTH EVERYDAY AT BEDTIME 180 tablet 2  . fluticasone (FLONASE) 50 MCG/ACT nasal spray PLACE 2 SPRAYS INTO BOTH NOSTRILS AT BEDTIME AS NEEDED FOR ALLERGIES. 48 mL 1  . gabapentin (NEURONTIN) 300 MG capsule Take 1 capsule (300 mg total) by mouth 2 (two) times  daily. 60 capsule 0  . hydrochlorothiazide (HYDRODIURIL) 25 MG tablet TAKE ONE TABLET BY MOUTH ONCE DAILY 90 tablet 2  . losartan (COZAAR) 100 MG tablet TAKE ONE TABLET BY MOUTH EVERY MORNING 90 tablet 2  . metoprolol succinate (TOPROL-XL) 100 MG 24 hr tablet TAKE ONE TABLET BY MOUTH AT NOON 90 tablet 1  . montelukast (SINGULAIR) 10 MG tablet TAKE ONE TABLET BY MOUTH EVERYDAY AT BEDTIME 30 tablet 3  . MYRBETRIQ 50 MG TB24 tablet Take 50 mg by mouth daily.    . nitroGLYCERIN (NITROSTAT) 0.4 MG SL tablet Place 1 tablet (0.4 mg total) under the tongue every 5 (five) minutes as needed for chest pain. 25 tablet 2  . ondansetron (ZOFRAN-ODT) 4 MG disintegrating tablet '4mg'$  ODT q4 hours prn nausea/vomit 20 tablet 0  . pantoprazole (PROTONIX) 40 MG tablet Take 1 tablet (40 mg total) by mouth daily. 30 tablet 3  . PRALUENT 75 MG/ML SOAJ inject '75mg'$  into THE SKIN every 14 DAYS 2 mL 11  . rosuvastatin (CRESTOR) 20 MG tablet Take 1 tablet (20 mg total) by mouth at bedtime. 90 tablet 3  . sertraline (ZOLOFT) 50 MG tablet Take 1 tablet (50 mg total) by mouth daily. 90 tablet 1  . traMADol (ULTRAM) 50 MG tablet Take 1 tablet (50 mg total) by mouth every 6 (six) hours as needed. 20 tablet 0   No current facility-administered medications on file prior to visit.    Past Medical History:  Diagnosis Date  . Allergy   . Anxiety   . C. difficile diarrhea    history of  . CAD (coronary artery disease), native coronary artery    a. 05/2017 showed 100% mLCx tx with DES, otherwise 20% mRCA, 50% ramus, 30% dLAD, 20% prox-mid LAD, 20% mLAD treated medically.   . Cancer ([place])    Right kidney CA removed.   . CKD (chronic kidney disease) stage 3, GFR 30-59 ml/min ([place]) 10/11/2016   s/p R nephrectomy  . Colon polyps 2008   HYPERPLASTIC  . COPD (chronic obstructive pulmonary disease) ([place])    former smoker  . Gastritis   . GERD (gastroesophageal reflux disease)   . History of echocardiogram    Echo 3/18:  Moderate  LVH, EF [id number], grade 1 diastolic dysfunction, calcified aortic valve, mild MR, moderate LAE  . History of nuclear stress test    Myoview 3/18: Mod size and intensity fixed septal defect, may be artifact.Opposite mod size and intensity lat defect, which is reversible and could represent ischemia or possibly artifact (SDS 4). LVEF 71% with normal wall motion. Intermediate risk study. >> images reviewed with Dr. Dorris Carnes - no sig ischemia; med rx   . Hx of cardiovascular stress test    Lexiscan Myoview 6/16:  EF 70%, no scar or ischemia; Low Risk  . Hyperlipidemia   . Hypertension   . Neuropathy   . Orthostatic hypotension 05/28/2017  . OSA (obstructive sleep apnea) 01/16/2021  . Osteoarthritis   . Pre-diabetes    no meds  . Rheumatoid arthritis ([place])  RA (Dr. Ouida Sills) Bilateral hands  . S/P angioplasty with stent 05/27/17 to LCX with DES  05/28/2017  . Sleep apnea 2023   does not use CPAP  . Thyroid disease    Allergies  Allergen Reactions  . Oxycodone-Acetaminophen Hives and Itching    hallucinations  . Percocet [Oxycodone-Acetaminophen] Hives, Itching and Other (See Comments)    hallucinations  . Pravastatin Sodium Other (See Comments)    Muscle aches Muscle aches  . Hydrocodone Other (See Comments)    "crazy dreams"  . Aspirin Other (See Comments)    GI upset Other reaction(s): Other (See Comments) REACTION: GI upset    Social History   Socioeconomic History  . Marital status: Divorced    Spouse name: Not on file  . Number of children: 3  . Years of education: 58  . Highest education level: Not on file  Occupational History  . Occupation: Retired    Fish farm manager: RETIRED  Tobacco Use  . Smoking status: Former    Packs/day: 0.20    Years: 15.00    Total pack years: 3.00    Types: Cigarettes    Start date: 83    Quit date: 09/07/2021    Years since quitting: 1.0  . Smokeless tobacco: Never  . Tobacco comments:    pt has been an on and off smoker since age 69   Vaping Use  . Vaping Use: Never used  Substance and Sexual Activity  . Alcohol use: No    Alcohol/week: 0.0 standard drinks of alcohol  . Drug use: No  . Sexual activity: Not Currently    Birth control/protection: Surgical    Comment: hyst  Other Topics Concern  . Not on file  Social History Narrative   Single, dgtr lives with her   Current on/off smoker   Alcohol use- no   Drug use-no   Regular Exercise-yes   Social Determinants of Health   Financial Resource Strain: Low Risk  (09/08/2022)   Overall Financial Resource Strain (CARDIA)   . Difficulty of Paying Living Expenses: Not hard at all  Food Insecurity: No Food Insecurity (09/08/2022)   Hunger Vital Sign   . Worried About Charity fundraiser in the Last Year: Never true   . Ran Out of Food in the Last Year: Never true  Transportation Needs: No Transportation Needs (09/08/2022)   PRAPARE - Transportation   . Lack of Transportation (Medical): No   . Lack of Transportation (Non-Medical): No  Physical Activity: Inactive (09/08/2022)   Exercise Vital Sign   . Days of Exercise per Week: 0 days   . Minutes of Exercise per Session: 0 min  Stress: Stress Concern Present (09/08/2022)   [place]   . Feeling of Stress : To some extent  Social Connections: Moderately Integrated (09/08/2022)   Social Connection and Isolation Panel [NHANES]   . Frequency of Communication with Friends and Family: More than three times a week   . Frequency of Social Gatherings with Friends and Family: More than three times a week   . Attends Religious Services: More than 4 times per year   . Active Member of Clubs or Organizations: Yes   . Attends Archivist Meetings: More than 4 times per year   . Marital Status: Divorced    Vitals:   09/27/22 1222  BP: 120/76  Pulse: 64  SpO2: 97%   Body mass index is 37.18 kg/m.  Physical Exam Vitals  and nursing note reviewed.   Constitutional:      General: She is not in acute distress.    Appearance: She is well-developed.  HENT:     Head: Normocephalic and atraumatic.     Mouth/Throat:     Mouth: Mucous membranes are moist.     Pharynx: Oropharynx is clear.  Eyes:     Conjunctiva/sclera: Conjunctivae normal.  Cardiovascular:     Rate and Rhythm: Normal rate and regular rhythm.     Pulses:          Dorsalis pedis pulses are 2+ on the right side and 2+ on the left side.     Heart sounds: Murmur (Soft SEM RUSB-LUSB) heard.  Pulmonary:     Effort: Pulmonary effort is normal. No respiratory distress.     Breath sounds: Normal breath sounds.  Abdominal:     Palpations: Abdomen is soft. There is no mass.     Tenderness: There is no abdominal tenderness.  Musculoskeletal:     Right hip: Tenderness present. Decreased range of motion.       Legs:  Skin:    General: Skin is warm.     Findings: No erythema or rash.  Neurological:     General: No focal deficit present.     Mental Status: She is alert and oriented to person, place, and time.     Cranial Nerves: No cranial nerve deficit.     Gait: Gait normal.  Psychiatric:     Comments: Well groomed, good eye contact.    ASSESSMENT AND PLAN:  There are no diagnoses linked to this encounter.  No orders of the defined types were placed in this encounter.   No problem-specific Assessment & Plan notes found for this encounter.   No follow-ups on file.  Gracianna Vink G. [name], MD  [facility]. [place] office.

## 2022-09-27 ENCOUNTER — Telehealth: Payer: Self-pay | Admitting: Family Medicine

## 2022-09-27 ENCOUNTER — Encounter: Payer: Self-pay | Admitting: Family Medicine

## 2022-09-27 ENCOUNTER — Ambulatory Visit (INDEPENDENT_AMBULATORY_CARE_PROVIDER_SITE_OTHER): Payer: Medicare HMO | Admitting: Family Medicine

## 2022-09-27 VITALS — BP 120/76 | HR 64 | Resp 16 | Ht 66.0 in | Wt 230.4 lb

## 2022-09-27 DIAGNOSIS — J449 Chronic obstructive pulmonary disease, unspecified: Secondary | ICD-10-CM | POA: Diagnosis not present

## 2022-09-27 DIAGNOSIS — C649 Malignant neoplasm of unspecified kidney, except renal pelvis: Secondary | ICD-10-CM | POA: Diagnosis not present

## 2022-09-27 DIAGNOSIS — G959 Disease of spinal cord, unspecified: Secondary | ICD-10-CM

## 2022-09-27 DIAGNOSIS — I739 Peripheral vascular disease, unspecified: Secondary | ICD-10-CM | POA: Diagnosis not present

## 2022-09-27 DIAGNOSIS — I119 Hypertensive heart disease without heart failure: Secondary | ICD-10-CM | POA: Diagnosis not present

## 2022-09-27 DIAGNOSIS — G629 Polyneuropathy, unspecified: Secondary | ICD-10-CM

## 2022-09-27 DIAGNOSIS — F331 Major depressive disorder, recurrent, moderate: Secondary | ICD-10-CM | POA: Diagnosis not present

## 2022-09-27 DIAGNOSIS — N183 Chronic kidney disease, stage 3 unspecified: Secondary | ICD-10-CM

## 2022-09-27 DIAGNOSIS — E1122 Type 2 diabetes mellitus with diabetic chronic kidney disease: Secondary | ICD-10-CM

## 2022-09-27 DIAGNOSIS — M25551 Pain in right hip: Secondary | ICD-10-CM

## 2022-09-27 DIAGNOSIS — R69 Illness, unspecified: Secondary | ICD-10-CM | POA: Diagnosis not present

## 2022-09-27 LAB — POCT GLYCOSYLATED HEMOGLOBIN (HGB A1C): Hemoglobin A1C: 6.1 % — AB (ref 4.0–5.6)

## 2022-09-27 NOTE — Assessment & Plan Note (Deleted)
Foillows with urologist annually, next appt in 10/2022.

## 2022-09-27 NOTE — Telephone Encounter (Signed)
Completed & on pcp's desk for signature.

## 2022-09-27 NOTE — Assessment & Plan Note (Addendum)
LDL 32 on 09/02/22. On Rosuvastatin 20 mg daily,Praluent 75 mg q 2 weeks, and Aspirin 81 mg daily. Follows with cardiologist.

## 2022-09-27 NOTE — Assessment & Plan Note (Signed)
Currently on Symbicort 80-4.5 mcg 2 puff twice daily and albuterol inhaler 1 to 2 puff every 4-6 hours as needed. Follows with pulmonologist.

## 2022-09-27 NOTE — Assessment & Plan Note (Signed)
Gabapentin was recently resumed. Continue appropriate skin/foot care. Follows with neurologist annually.

## 2022-09-27 NOTE — Assessment & Plan Note (Signed)
Stable. Following with neurosurgeon.

## 2022-09-27 NOTE — Telephone Encounter (Signed)
Handicap Placard to be filled out--placed in dr's folder.  Call patient upon completion--will come to pick up.  Pt requesting form by Wednesday.

## 2022-09-27 NOTE — Patient Instructions (Signed)
A few things to remember from today's visit:  Type 2 diabetes mellitus with stage 3 chronic kidney disease, without long-term current use of insulin, unspecified whether stage 3a or 3b CKD ([name]) - Plan: POC HgB A1c  Hypertension with heart disease  Pain of right hip - Plan: Ambulatory referral to Orthopedics  Moderate episode of recurrent major depressive disorder ([name]) - Plan: Ambulatory referral to Psychiatry  No changes today.  If you need refills for medications you take chronically, please call your pharmacy. Do not use My Chart to request refills or for acute issues that need immediate attention. If you send a my chart message, it may take a few days to be addressed, specially if I am not in the office.  Please be sure medication list is accurate. If a new problem present, please set up appointment sooner than planned today.

## 2022-09-27 NOTE — Assessment & Plan Note (Signed)
BP adequately controlled. Continue metoprolol succinate 100 mg daily, losartan 100 mg daily, and hydrochlorothiazide 25 mg daily. Continue DASH/low salt diet. Eye exam is current.

## 2022-09-27 NOTE — Assessment & Plan Note (Signed)
Problem has improved but is not well-controlled. She prefers not to increase dose of sertraline, she will continue 50 mg daily. Her daughter would like to have psychiatric evaluation arranged, referral placed.

## 2022-09-27 NOTE — Assessment & Plan Note (Signed)
HgA1C went up from 6.3 to 6.1. Continue nonpharmacologic treatment. Continue annual eye exam, periodic dental and foot care. F/U in 5-6 months.

## 2022-09-27 NOTE — Assessment & Plan Note (Signed)
Status post right nephrectomy in 2010. Foillows with urologist annually, next appt in 10/2022.

## 2022-09-29 ENCOUNTER — Other Ambulatory Visit (HOSPITAL_BASED_OUTPATIENT_CLINIC_OR_DEPARTMENT_OTHER): Payer: Self-pay

## 2022-09-29 ENCOUNTER — Ambulatory Visit: Payer: Medicare HMO | Admitting: Psychologist

## 2022-09-29 DIAGNOSIS — F331 Major depressive disorder, recurrent, moderate: Secondary | ICD-10-CM

## 2022-09-29 DIAGNOSIS — R69 Illness, unspecified: Secondary | ICD-10-CM | POA: Diagnosis not present

## 2022-09-29 MED ORDER — COMIRNATY 30 MCG/0.3ML IM SUSY
PREFILLED_SYRINGE | INTRAMUSCULAR | 0 refills | Status: DC
  Filled 2022-09-29: qty 0.3, 1d supply, fill #0

## 2022-09-29 NOTE — Progress Notes (Signed)
[facility] Counselor/Therapist Progress Note  Patient ID: [name], MRN: [id number],    Date: 09/29/2022  Time Spent: 02:04 pm to 02:30 pm; total time: 26 minutes   This session was held via in person. The patient consented to in-person therapy and was in the clinician's office. Limits of confidentiality were discussed with the patient.   Treatment Type: Individual Therapy  Reported Symptoms: Stated that she is doing better.  Mental Status Exam: Appearance:  Well Groomed     Behavior: Appropriate  Motor: Normal  Speech/Language:  Clear and Coherent  Affect: Appropriate  Mood: normal  Thought process: normal  Thought content:   WNL  Sensory/Perceptual disturbances:   WNL  Orientation: oriented to person, place, and time/date  Attention: Good  Concentration: Good  Memory: WNL  Fund of knowledge:  Good  Insight:   Fair  Judgment:  Good  Impulse Control: Good   Risk Assessment: Danger to Self:  No Self-injurious Behavior: No Danger to Others: No Duty to Warn:no Physical Aggression / Violence:No  Access to Firearms a concern: No  Gang Involvement:No   Subjective: Beginning the session, patient stated that she is doing okay. She acknowledged having a more difficult day, but in general feels better. Patient stated that she has discovered that she is not the only who feels the way she does about the pastor, which helps her. Patient voiced that she has decided that she will attempt to attend the church again. Patient expressed sadness upon hearing that the clinician is leaving. She processed thoughts and emotions. She asked to follow up. She denied suicidal and homicidal ideation.    Interventions:  Worked on developing a therapeutic relationship with the patient using active listening and reflective statements. Provided emotional support using empathy and validation. Reviewed the treatment plan with the patient. Reviewed events since the last session. Normalized  and validated having having both good and bad days. Identified goals for the session. Used socratic questions to assist the patient. Processed how the patient felt about returning to church. Reflected on how the patient feels knowing that others have similar feelings that she has regarding the pastor. Processed thoughts and emotions. Disclosed to the patient about the transition. Validated emotions. Normalized feeling sad about the transition. Discussed next steps. Assigned homework. Provided empathic statements. Assessed for suicidal and homicidal ideation.   Homework: Attend church if patient wants  Next Session: Review homework, emotional support. Process transition.   Diagnosis: F33.1 major depressive affective disorder, recurrent, moderate  Plan:   Goals Alleviate depressive symptoms Recognize, accept, and cope with depressive feelings Develop healthy thinking patterns Develop healthy interpersonal relationships  Objectives target date for all objectives is 04/13/2023 Cooperate with a medication evaluation by a physician Verbalize an accurate understanding of depression Verbalize an understanding of the treatment Identify and replace thoughts that support depression Learn and implement behavioral strategies Verbalize an understanding and resolution of current interpersonal problems Learn and implement problem solving and decision making skills Learn and implement conflict resolution skills to resolve interpersonal problems Verbalize an understanding of healthy and unhealthy emotions verbalize insight into how past relationships may be influence current experiences with depression Use mindfulness and acceptance strategies and increase value based behavior  Increase hopeful statements about the future.  Interventions Consistent with treatment model, discuss how change in cognitive, behavioral, and interpersonal can help client alleviate depression CBT Behavioral activation help the  client explore the relationship, nature of the dispute,  Help the client develop new interpersonal skills  and relationships Conduct Problem so living therapy Teach conflict resolution skills Use a process-experiential approach Conduct TLDP Conduct ACT Evaluate need for psychotropic medication Monitor adherence to medication   The patient and clinician reviewed the treatment plan on 04/26/2022. The patient approved of the treatment plan.   [name], PsyD

## 2022-09-29 NOTE — Telephone Encounter (Signed)
Form is up front for pick up - patient is aware.

## 2022-10-11 ENCOUNTER — Ambulatory Visit: Payer: Medicare HMO | Admitting: Psychologist

## 2022-10-11 DIAGNOSIS — R69 Illness, unspecified: Secondary | ICD-10-CM | POA: Diagnosis not present

## 2022-10-11 DIAGNOSIS — F331 Major depressive disorder, recurrent, moderate: Secondary | ICD-10-CM

## 2022-10-11 NOTE — Progress Notes (Signed)
ACUTE VISIT Chief Complaint  Patient presents with   Depression   memory issues   HPI: [name] is a 77 y.o. female, who is here today because her daughter was concerned about forgetful episode this past weekend. Her daughter is not here today but she was the one that scheduled her appt, concerned about depression and possible dementia. She was last seen on 09/27/22 for follow up. . She has been referred to a psychiatrist but has not yet received a phone call. She has not been experiencing memory issues, but this past weekend she forgot the day of the week, most of the day she thought it was Sunday and was trying to get ready for church. She has had days with low motivation and excessive sleepiness. Her therapist has informed her that she may have days like this occasionally. Negative for headache or new neurologic symptom, no focal weakness.  She is on Sertraline 50 mg daily.     09/27/2022   12:23 PM 09/08/2022    2:28 PM 09/08/2022    2:06 PM 05/31/2022   12:02 PM 03/24/2022    2:49 PM  Depression screen PHQ 2/9  Decreased Interest 1 1 0 1 1  Down, Depressed, Hopeless 1 1 0 2 1  PHQ - 2 Score 2 2 0 3 2  Altered sleeping 1 0  2 1  Tired, decreased energy 1 1  2 2   Change in appetite 1 0  1 1  Feeling bad or failure about yourself  1 0  0 0  Trouble concentrating 1 0  1 0  Moving slowly or fidgety/restless 0 0  0 1  Suicidal thoughts 0 0  0 0  PHQ-9 Score 7 3  9 7   Difficult doing work/chores Somewhat difficult Not difficult at all  Somewhat difficult Not difficult at all   OSA: She is doing better wearing her CPAP and feels better in regard to fatigue but still feels like taking naps.  Peripheral neuropathy, follows with neurologist, last seen on 07/12/22. She does not have a follow up appt. She is on Gabapentin 300 mg bid, resumed because numbness became worse after discontinuing medication.  Lab Results  Component Value Date   VITAMINB12 232 11/10/2020   CKD III,  last e GFR 29 in 07/2022. She is s/p right nephrectomy due to renal malignancy. She has reported that she follows with "kidney doctor" annually and that her renal function is usually monitored. She does not recall provider's name, he is at at [facility] on [facility]. She has not noted gross hematuria,foam in urine,or decreased urine output. HTN on on metoprolol succinate 100 mg daily, losartan 100 mg daily, and HCTZ 25 mg daily  Lab Results  Component Value Date   CREATININE 1.65 (H) 07/05/2022   BUN 19 07/05/2022   NA 139 07/05/2022   K 3.6 07/05/2022   CL 102 07/05/2022   CO2 29 07/05/2022   She was c/o right hip pain last visit, saw ortho and was recommended intra articular injection, Dx'ed with OA.  Reports that she experienced a bruise-like reaction on her arm after her last COVID shot, which was ecchymotic and pruritic. She applied witch hazel and cream to alleviate the pruritus . No other systemic symptoms, she inquires about safety of taking more COVID 19 vaccines in the future.  Review of Systems  Constitutional:  Positive for fatigue. Negative for appetite change, chills and fever.  HENT:  Negative for mouth sores and  sore throat.   Respiratory:  Negative for cough, shortness of breath and wheezing.   Cardiovascular:  Negative for chest pain and palpitations.  Gastrointestinal:  Negative for abdominal pain, nausea and vomiting.  Endocrine: Negative for cold intolerance and heat intolerance.  Genitourinary:  Negative for difficulty urinating and dysuria.  Skin:  Negative for rash.  Neurological:  Negative for syncope and facial asymmetry.  Psychiatric/Behavioral:  Negative for confusion. The patient is nervous/anxious.   See other pertinent positives and negatives in HPI.  Current Outpatient Medications on File Prior to Visit  Medication Sig Dispense Refill   acetaminophen (TYLENOL) 500 MG tablet Take 1,000 mg by mouth as needed for moderate pain or headache.       albuterol (VENTOLIN HFA) 108 (90 Base) MCG/ACT inhaler INHALE ONE OR TWO PUFFS BY MOUTH INTO THE LUNGS EVERY SIX HOURS AS NEEDED FOR WHEEZING OR SHORTNESS OF BREATH 8.5 g 12   aspirin 81 MG chewable tablet Chew 81 mg by mouth daily.     Budeson-Glycopyrrol-Formoterol (BREZTRI AEROSPHERE) 160-9-4.8 MCG/ACT AERO Inhale 2 puffs into the lungs in the morning and at bedtime. 2 each 11   budesonide-formoterol (SYMBICORT) 80-4.5 MCG/ACT inhaler Inhale 2 puffs then rinse mouth, twice daily 1 each 12   COVID-19 mRNA vaccine 2023-2024 (COMIRNATY) syringe Inject into the muscle. 0.3 mL 0   famotidine (PEPCID) 20 MG tablet TAKE ONE TABLET BY MOUTH EVERY MORNING and TAKE ONE TABLET BY MOUTH EVERYDAY AT BEDTIME 180 tablet 2   fluticasone (FLONASE) 50 MCG/ACT nasal spray PLACE 2 SPRAYS INTO BOTH NOSTRILS AT BEDTIME AS NEEDED FOR ALLERGIES. 48 mL 1   gabapentin (NEURONTIN) 300 MG capsule Take 1 capsule (300 mg total) by mouth 2 (two) times daily. 60 capsule 0   hydrochlorothiazide (HYDRODIURIL) 25 MG tablet TAKE ONE TABLET BY MOUTH ONCE DAILY 90 tablet 2   losartan (COZAAR) 100 MG tablet TAKE ONE TABLET BY MOUTH EVERY MORNING 90 tablet 2   metoprolol succinate (TOPROL-XL) 100 MG 24 hr tablet TAKE ONE TABLET BY MOUTH AT NOON 90 tablet 1   montelukast (SINGULAIR) 10 MG tablet TAKE ONE TABLET BY MOUTH EVERYDAY AT BEDTIME 30 tablet 3   MYRBETRIQ 50 MG TB24 tablet Take 50 mg by mouth daily.     nitroGLYCERIN (NITROSTAT) 0.4 MG SL tablet Place 1 tablet (0.4 mg total) under the tongue every 5 (five) minutes as needed for chest pain. 25 tablet 2   ondansetron (ZOFRAN-ODT) 4 MG disintegrating tablet 4mg  ODT q4 hours prn nausea/vomit 20 tablet 0   pantoprazole (PROTONIX) 40 MG tablet Take 1 tablet (40 mg total) by mouth daily. 30 tablet 3   PRALUENT 75 MG/ML SOAJ inject 75mg  into THE SKIN every 14 DAYS 2 mL 11   rosuvastatin (CRESTOR) 20 MG tablet Take 1 tablet (20 mg total) by mouth at bedtime. 90 tablet 3   sertraline  (ZOLOFT) 50 MG tablet Take 1 tablet (50 mg total) by mouth daily. 90 tablet 1   traMADol (ULTRAM) 50 MG tablet Take 1 tablet (50 mg total) by mouth every 6 (six) hours as needed. 20 tablet 0   No current facility-administered medications on file prior to visit.   Past Medical History:  Diagnosis Date   Allergy    Anxiety    C. difficile diarrhea    history of   CAD (coronary artery disease), native coronary artery    a. 05/2017 showed 100% mLCx tx with DES, otherwise 20% mRCA, 50% ramus, 30% dLAD, 20% prox-mid LAD,  20% mLAD treated medically.    Cancer [facility])    Right kidney CA removed.    CKD (chronic kidney disease) stage 3, GFR 30-59 ml/min (HCC) 10/11/2016   s/p R nephrectomy   Colon polyps 2008   HYPERPLASTIC   COPD (chronic obstructive pulmonary disease) ([place])    former smoker   Gastritis    GERD (gastroesophageal reflux disease)    History of echocardiogram    Echo 3/18:  Moderate LVH, EF [id number], grade 1 diastolic dysfunction, calcified aortic valve, mild MR, moderate LAE   History of nuclear stress test    Myoview 3/18: Mod size and intensity fixed septal defect, may be artifact.Opposite mod size and intensity lat defect, which is reversible and could represent ischemia or possibly artifact (SDS 4). LVEF 71% with normal wall motion. Intermediate risk study. >> images reviewed with Dr. Dorris Carnes - no sig ischemia; med rx    Hx of cardiovascular stress test    Lexiscan Myoview 6/16:  EF 70%, no scar or ischemia; Low Risk   Hyperlipidemia    Hypertension    Neuropathy    Orthostatic hypotension 05/28/2017   OSA (obstructive sleep apnea) 01/16/2021   Osteoarthritis    Pre-diabetes    no meds   Rheumatoid arthritis (HCC)    RA (Dr. Ouida Sills) Bilateral hands   S/P angioplasty with stent 05/27/17 to LCX with DES  05/28/2017   Sleep apnea 2023   does not use CPAP   Thyroid disease    Allergies  Allergen Reactions   Oxycodone-Acetaminophen Hives and Itching     hallucinations   Percocet [Oxycodone-Acetaminophen] Hives, Itching and Other (See Comments)    hallucinations   Pravastatin Sodium Other (See Comments)    Muscle aches Muscle aches   Hydrocodone Other (See Comments)    "crazy dreams"   Aspirin Other (See Comments)    GI upset Other reaction(s): Other (See Comments) REACTION: GI upset    Social History   Socioeconomic History   Marital status: Divorced    Spouse name: Not on file   Number of children: 3   Years of education: 12   Highest education level: Not on file  Occupational History   Occupation: Retired    Fish farm manager: RETIRED  Tobacco Use   Smoking status: Former    Packs/day: 0.20    Years: 15.00    Additional pack years: 0.00    Total pack years: 3.00    Types: Cigarettes    Start date: 1970    Quit date: 09/07/2021    Years since quitting: 1.1   Smokeless tobacco: Never   Tobacco comments:    pt has been an on and off smoker since age 70  Vaping Use   Vaping Use: Never used  Substance and Sexual Activity   Alcohol use: No    Alcohol/week: 0.0 standard drinks of alcohol   Drug use: No   Sexual activity: Not Currently    Birth control/protection: Surgical    Comment: hyst  Other Topics Concern   Not on file  Social History Narrative   Single, dgtr lives with her   Current on/off smoker   Alcohol use- no   Drug use-no   Regular Exercise-yes   Social Determinants of Health   Financial Resource Strain: Low Risk  (09/08/2022)   Overall Financial Resource Strain (CARDIA)    Difficulty of Paying Living Expenses: Not hard at all  Food Insecurity: No Food Insecurity (09/08/2022)   Hunger Vital Sign  Worried About Charity fundraiser in the Last Year: Never true    [place] in the Last Year: Never true  Transportation Needs: No Transportation Needs (09/08/2022)   PRAPARE - Hydrologist (Medical): No    Lack of Transportation (Non-Medical): No  Physical Activity: Inactive  (09/08/2022)   Exercise Vital Sign    Days of Exercise per Week: 0 days    Minutes of Exercise per Session: 0 min  Stress: Stress Concern Present (09/08/2022)   [place]    Feeling of Stress : To some extent  Social Connections: Moderately Integrated (09/08/2022)   Social Connection and Isolation Panel [NHANES]    Frequency of Communication with Friends and Family: More than three times a week    Frequency of Social Gatherings with Friends and Family: More than three times a week    Attends Religious Services: More than 4 times per year    Active Member of Clubs or Organizations: Yes    Attends Archivist Meetings: More than 4 times per year    Marital Status: Divorced   Vitals:   10/12/22 1550  BP: 128/80  Pulse: 82  Resp: 16  SpO2: 94%   Wt Readings from Last 3 Encounters:  10/12/22 232 lb (105.2 kg)  09/27/22 230 lb 6 oz (104.5 kg)  09/08/22 227 lb 12.8 oz (103.3 kg)   Body mass index is 37.45 kg/m.  Physical Exam Vitals and nursing note reviewed.  Constitutional:      General: She is not in acute distress.    Appearance: She is well-developed.  HENT:     Head: Normocephalic and atraumatic.     Mouth/Throat:     Mouth: Mucous membranes are moist.     Pharynx: Oropharynx is clear.  Eyes:     Conjunctiva/sclera: Conjunctivae normal.  Cardiovascular:     Rate and Rhythm: Normal rate and regular rhythm.     Pulses:          Dorsalis pedis pulses are 2+ on the right side and 2+ on the left side.     Heart sounds: Murmur (Soft SEM LUSB) heard.  Pulmonary:     Effort: Pulmonary effort is normal. No respiratory distress.     Breath sounds: Normal breath sounds.  Abdominal:     Palpations: Abdomen is soft. There is no mass.     Tenderness: There is no abdominal tenderness.  Skin:    General: Skin is warm.     Findings: No erythema or rash.  Neurological:     General: No focal deficit present.      Mental Status: She is alert and oriented to person, place, and time.     Cranial Nerves: No cranial nerve deficit.     Gait: Gait normal.  Psychiatric:        Mood and Affect: Mood and affect normal.   ASSESSMENT AND PLAN:  Ms. Siglin was seen for depression and forgetful episode.  Memory disturbance One episode when she forgot day of the week but this doe snot happen frequently, in fact she feels like her memory is pretty good. Her daughter is very concerned about possible dementia.  Some of her comorbilities and medications can contribute to memory difficulties. B12 has been on lower normal range, she is taking B12 supplementation. Last TSH 2.1 in 07/2022. I do not think imaging is needed at this time. Recommend having calendar  visible in her room with day,date,month,year and cognitive challenging exercises. A healthful diet and regular physical activity will also help. She follows with neurologist, recommend arranging appt if there is still a concern.  Stage 3a chronic kidney disease (HCC) Assessment & Plan: Cr 1.5-1.7 and e GFT 30's in the past year. . Last eGFR was 29 and Cr 1.6 in 07/2022. She states that she is following with nephrologist, she has an appointment next week.  She is seeing provider because of her history of nephrectomy, so I think her appointment may be with urologist. She is thinking she is going to have blood work done but states that did not have labs last visit , a year ago. She does not recall name of provider but will let me know after her visit. Continue adequate hydration, low-salt diet, and NSAIDs avoidance. Adequate BP and glucose control.  Polyneuropathy Assessment & Plan: Currently she is on gabapentin 300 mg twice daily, which could be contributing to some of her symptoms. Recommend trying to decrease dose to 300 mg at bedtime, she can take 600 mg at bedtime if peripheral neuropathic symptoms get worse. Continue following with  neurologist.  Moderate episode of recurrent major depressive disorder ([place]) Assessment & Plan: Problem has improved but is not well-controlled. Decreasing Gabapentin dose may help. Following with psychotherapist. Pending appointment with psychiatrist.   Local reaction to COVID-19 vaccine Assessment & Plan: It just affected the extremity where vaccine was given , so it seems more a local reaction rather than allergic reaction. At this time she does not need more COVID 19 vaccines.    I spent a total of 41 minutes in both face to face and non face to face activities for this visit on the date of this encounter. During this time history was obtained and documented, examination was performed, prior labs/imaging reviewed, and assessment/plan discussed.  Return if symptoms worsen or fail to improve, for keep next appointment.  Malina Geers G. [name], MD  [facility]. [place] office.

## 2022-10-11 NOTE — Progress Notes (Signed)
[facility] Counselor/Therapist Progress Note  Patient ID: [name], MRN: [id number],    Date: 10/11/2022  Time Spent: 02:06 pm to 02:44 pm; total time: 38 minutes   This session was held via in person. The patient consented to in-person therapy and was in the clinician's office. Limits of confidentiality were discussed with the patient.   Treatment Type: Individual Therapy  Reported Symptoms: Stated that she had a difficult weekend  Mental Status Exam: Appearance:  Well Groomed     Behavior: Appropriate  Motor: Normal  Speech/Language:  Clear and Coherent  Affect: Appropriate  Mood: normal  Thought process: normal  Thought content:   WNL  Sensory/Perceptual disturbances:   WNL  Orientation: oriented to person, place, and time/date  Attention: Good  Concentration: Good  Memory: WNL  Fund of knowledge:  Good  Insight:   Fair  Judgment:  Good  Impulse Control: Good   Risk Assessment: Danger to Self:  No Self-injurious Behavior: No Danger to Others: No Duty to Warn:no Physical Aggression / Violence:No  Access to Firearms a concern: No  Gang Involvement:No   Subjective: Beginning the session, patient stated that she experienced a difficult weekend and discussed what made it so challenging. From there, she spent the session, reflecting on ways to maintain an active lifestyle moving forward. She then began to briefly discuss the transition process. She was agreeable to homework and following up. She denied suicidal and homicidal ideation.    Interventions:  Worked on developing a therapeutic relationship with the patient using active listening and reflective statements. Provided emotional support using empathy and validation. Reviewed the treatment plan with the patient. Normalized and validated thoughts related to the weekend. Processed what she was experiencing. Identified goals for the session. Processed different ways that patient can maintain an active  lifestyle moving forward. Assisted in identifying who can hold the patient accountable to being active. Validated patient's experience. Processed thoughts and emotions. Briefly processed thoughts related to the transition. Assigned homework. Provided empathic statements. Assessed for suicidal and homicidal ideation.   Homework: Attend small group, attend the [employer], have a friend over, make a meal for sister  Next Session: Review homework, emotional support. Process transition.   Diagnosis: F33.1 major depressive affective disorder, recurrent, moderate  Plan:   Goals Alleviate depressive symptoms Recognize, accept, and cope with depressive feelings Develop healthy thinking patterns Develop healthy interpersonal relationships  Objectives target date for all objectives is 04/13/2023 Cooperate with a medication evaluation by a physician Verbalize an accurate understanding of depression Verbalize an understanding of the treatment Identify and replace thoughts that support depression Learn and implement behavioral strategies Verbalize an understanding and resolution of current interpersonal problems Learn and implement problem solving and decision making skills Learn and implement conflict resolution skills to resolve interpersonal problems Verbalize an understanding of healthy and unhealthy emotions verbalize insight into how past relationships may be influence current experiences with depression Use mindfulness and acceptance strategies and increase value based behavior  Increase hopeful statements about the future.  Interventions Consistent with treatment model, discuss how change in cognitive, behavioral, and interpersonal can help client alleviate depression CBT Behavioral activation help the client explore the relationship, nature of the dispute,  Help the client develop new interpersonal skills and relationships Conduct Problem so living therapy Teach conflict resolution skills Use  a process-experiential approach Conduct TLDP Conduct ACT Evaluate need for psychotropic medication Monitor adherence to medication   The patient and clinician reviewed the treatment plan on 04/26/2022.  The patient approved of the treatment plan.   [name], PsyD

## 2022-10-12 ENCOUNTER — Encounter: Payer: Self-pay | Admitting: Orthopaedic Surgery

## 2022-10-12 ENCOUNTER — Ambulatory Visit (INDEPENDENT_AMBULATORY_CARE_PROVIDER_SITE_OTHER): Payer: Medicare HMO | Admitting: Orthopaedic Surgery

## 2022-10-12 ENCOUNTER — Ambulatory Visit (INDEPENDENT_AMBULATORY_CARE_PROVIDER_SITE_OTHER): Payer: Medicare HMO

## 2022-10-12 ENCOUNTER — Ambulatory Visit (INDEPENDENT_AMBULATORY_CARE_PROVIDER_SITE_OTHER): Payer: Medicare HMO | Admitting: Family Medicine

## 2022-10-12 ENCOUNTER — Encounter: Payer: Self-pay | Admitting: Family Medicine

## 2022-10-12 VITALS — BP 128/80 | HR 82 | Resp 16 | Ht 66.0 in | Wt 232.0 lb

## 2022-10-12 DIAGNOSIS — N1831 Chronic kidney disease, stage 3a: Secondary | ICD-10-CM | POA: Diagnosis not present

## 2022-10-12 DIAGNOSIS — M25551 Pain in right hip: Secondary | ICD-10-CM

## 2022-10-12 DIAGNOSIS — R413 Other amnesia: Secondary | ICD-10-CM | POA: Diagnosis not present

## 2022-10-12 DIAGNOSIS — G629 Polyneuropathy, unspecified: Secondary | ICD-10-CM

## 2022-10-12 DIAGNOSIS — R69 Illness, unspecified: Secondary | ICD-10-CM | POA: Diagnosis not present

## 2022-10-12 DIAGNOSIS — F331 Major depressive disorder, recurrent, moderate: Secondary | ICD-10-CM

## 2022-10-12 DIAGNOSIS — T881XXA Other complications following immunization, not elsewhere classified, initial encounter: Secondary | ICD-10-CM | POA: Diagnosis not present

## 2022-10-12 NOTE — Assessment & Plan Note (Addendum)
Cr 1.5-1.7 and e GFT 30's in the past year. . Last eGFR was 29 and Cr 1.6 in 07/2022. She states that she is following with nephrologist, she has an appointment next week.  She is seeing provider because of her history of nephrectomy, so I think her appointment may be with urologist. She is thinking she is going to have blood work done but states that did not have labs last visit , a year ago. She does not recall name of provider but will let me know after her visit. Continue adequate hydration, low-salt diet, and NSAIDs avoidance. Adequate BP and glucose control.

## 2022-10-12 NOTE — Assessment & Plan Note (Addendum)
Problem has improved but is not well-controlled. Decreasing Gabapentin dose may help. Following with psychotherapist. Pending appointment with psychiatrist.

## 2022-10-12 NOTE — Assessment & Plan Note (Signed)
Currently she is on gabapentin 300 mg twice daily, which could be contributing to some of her symptoms. Recommend trying to decrease dose to 300 mg at bedtime, she can take 600 mg at bedtime if peripheral neuropathic symptoms get worse. Continue following with neurologist.

## 2022-10-12 NOTE — Progress Notes (Signed)
Office Visit Note   Patient: [name]           Date of Birth: 10/06/1945           MRN: [id number] Visit Date: 10/12/2022              Requested by: [name], [name], MD [address] [place],  [place] [address] PCP: [name], [name], MD   Assessment & Plan: Visit Diagnoses:  1. Pain in right hip     Plan: 77 year old female with chronic right hip pain.  Difficult to say if this is referred pain from the hip joint or it is coming from the piriformis and the short external rotators.  I recommended an intra-articular steroid injection for therapeutic and diagnostic purposes.  Will see how she responds to this and she will follow-up with me if her symptoms persist.  Follow-Up Instructions: No follow-ups on file.   Orders:  Orders Placed This Encounter  Procedures   XR Pelvis 1-2 Views   AMB referral to sports medicine   No orders of the defined types were placed in this encounter.     Procedures: No procedures performed   Clinical Data: No additional findings.   Subjective: Chief Complaint  Patient presents with   Right Hip - Pain    HPI  [name] is a 77 year old female with 1 month of right hip pain without any injuries.  States that she has a lateral buttock pain.  Denies any leg or groin pain or radicular symptoms.  She has had 2 back surgeries with Dr. Trenton Gammon.  Occasional numbness and tingling.  Review of Systems  Constitutional: Negative.   HENT: Negative.    Eyes: Negative.   Respiratory: Negative.    Cardiovascular: Negative.   Endocrine: Negative.   Musculoskeletal: Negative.   Neurological: Negative.   Hematological: Negative.   Psychiatric/Behavioral: Negative.    All other systems reviewed and are negative.    Objective: Vital Signs: There were no vitals taken for this visit.  Physical Exam Vitals and nursing note reviewed.  Constitutional:      Appearance: She is well-developed.  HENT:     Head: Atraumatic.     Nose: Nose  normal.  Eyes:     Extraocular Movements: Extraocular movements intact.  Cardiovascular:     Pulses: Normal pulses.  Pulmonary:     Effort: Pulmonary effort is normal.  Abdominal:     Palpations: Abdomen is soft.  Musculoskeletal:     Cervical back: Neck supple.  Skin:    General: Skin is warm.     Capillary Refill: Capillary refill takes less than 2 seconds.  Neurological:     Mental Status: She is alert. Mental status is at baseline.  Psychiatric:        Behavior: Behavior normal.        Thought Content: Thought content normal.        Judgment: Judgment normal.     Ortho Exam  Examination of right hip shows pain with hip range of motion and movement.  The pain localizes to the posterior lateral region.  Specialty Comments:  No specialty comments available.  Imaging: No results found.   PMFS History: Patient Active Problem List   Diagnosis Date Noted   Moderate episode of recurrent major depressive disorder ([place]) 03/24/2022   PAD (peripheral artery disease) ([place]) 11/28/2021   OSA (obstructive sleep apnea) 01/16/2021   COPD mixed type ([place]) 01/16/2021   Fatigue 11/03/2020  B12 deficiency 11/03/2020   Chest pain 08/30/2020   Malignant neoplasm of kidney excluding renal pelvis, unspecified laterality ([place]) 09/26/2019   Vitamin D deficiency, unspecified 09/26/2019   Anxiety disorder, unspecified 03/26/2019   Tobacco use disorder 09/29/2018   Cervical myelopathy ([place]) 05/29/2018   ANA positive 12/08/2017   Cervicalgia 09/16/2017   Carpal tunnel syndrome of left wrist 07/21/2017   Chondrocalcinosis 07/21/2017   Neuropathy 07/21/2017   Orthostatic hypotension 05/28/2017   S/P angioplasty with stent 05/27/17 to LCX with DES  05/28/2017   CAD (coronary artery disease)    Polyneuropathy 05/25/2017   Chest pain with moderate risk for cardiac etiology 05/25/2017   Class 1 obesity with serious comorbidity and body mass index (BMI) of 34.0 to 34.9 in adult 12/06/2016    CKD (chronic kidney disease) stage 3, GFR 30-59 ml/min (HCC) 10/11/2016   Hot flashes, menopausal 10/01/2016   Chest pain with moderate risk of acute coronary syndrome 10/25/2014   Degenerative spondylolisthesis 10/15/2014   Spinal stenosis, lumbar region, with neurogenic claudication 02/12/2013   DM (diabetes mellitus), type 2 with renal complications ([place]) 123XX123   History of Rtr nephrectomy 2010, secondary to renal cell cancer 01/14/2009   PERSONAL HX COLONIC POLYPS 11/14/2008   Hyperlipidemia associated with type 2 diabetes mellitus ([place]) 11/16/2007   ESOPHAGEAL STRICTURE 10/12/2007   Hypertension with heart disease 01/19/2007   Allergic rhinitis 01/19/2007   GERD 01/19/2007   Past Medical History:  Diagnosis Date   Allergy    Anxiety    C. difficile diarrhea    history of   CAD (coronary artery disease), native coronary artery    a. 05/2017 showed 100% mLCx tx with DES, otherwise 20% mRCA, 50% ramus, 30% dLAD, 20% prox-mid LAD, 20% mLAD treated medically.    Cancer [facility])    Right kidney CA removed.    CKD (chronic kidney disease) stage 3, GFR 30-59 ml/min (HCC) 10/11/2016   s/p R nephrectomy   Colon polyps 2008   HYPERPLASTIC   COPD (chronic obstructive pulmonary disease) ([place])    former smoker   Gastritis    GERD (gastroesophageal reflux disease)    History of echocardiogram    Echo 3/18:  Moderate LVH, EF [id number], grade 1 diastolic dysfunction, calcified aortic valve, mild MR, moderate LAE   History of nuclear stress test    Myoview 3/18: Mod size and intensity fixed septal defect, may be artifact.Opposite mod size and intensity lat defect, which is reversible and could represent ischemia or possibly artifact (SDS 4). LVEF 71% with normal wall motion. Intermediate risk study. >> images reviewed with Dr. Dorris Carnes - no sig ischemia; med rx    Hx of cardiovascular stress test    Lexiscan Myoview 6/16:  EF 70%, no scar or ischemia; Low Risk   Hyperlipidemia     Hypertension    Neuropathy    Orthostatic hypotension 05/28/2017   OSA (obstructive sleep apnea) 01/16/2021   Osteoarthritis    Pre-diabetes    no meds   Rheumatoid arthritis (HCC)    RA (Dr. Ouida Sills) Bilateral hands   S/P angioplasty with stent 05/27/17 to LCX with DES  05/28/2017   Sleep apnea 2023   does not use CPAP   Thyroid disease     Family History  Problem Relation Age of Onset   Rheum arthritis Mother    Stroke Mother    Prostate cancer Father    Heart disease Father    Diabetes Brother    Dementia Brother  Parkinsonism Brother    Kidney disease Son    Diabetes Brother    Cancer Brother    Dementia Brother    Colon cancer Neg Hx    Esophageal cancer Neg Hx    Pancreatic cancer Neg Hx    Liver disease Neg Hx    Rectal cancer Neg Hx    Stomach cancer Neg Hx     Past Surgical History:  Procedure Laterality Date   ABDOMINAL HYSTERECTOMY  1978   ANTERIOR CERVICAL DECOMP/DISCECTOMY FUSION N/A 05/29/2018   Procedure: ANTERIOR CERVICAL DECOMPRESSION FUSION - CERVICAL FIVE-CERVICAL SIX - CERVICAL SIX-CERVICAL SEVEN;  Surgeon: Earnie Larsson, MD;  Location: [facility];  Service: Neurosurgery;  Laterality: N/A;   BACK SURGERY     x 2   BREAST LUMPECTOMY WITH RADIOACTIVE SEED LOCALIZATION Right 04/13/2022   Procedure: RIGHT BREAST SEED LUMPECTOMY;  Surgeon: Erroll Luna, MD;  Location: [facility];  Service: General;  Laterality: Right;   CARPAL TUNNEL RELEASE Left    COLONOSCOPY W/ BIOPSIES AND POLYPECTOMY     Hx: of   CORONARY STENT INTERVENTION N/A 05/27/2017   Procedure: CORONARY STENT INTERVENTION;  Surgeon: Burnell Blanks, MD;  Location: [facility];  Service: Cardiovascular;  Laterality: N/A;   ESOPHAGOGASTRODUODENOSCOPY     HNP     LEFT HEART CATH AND CORONARY ANGIOGRAPHY N/A 05/27/2017   Procedure: LEFT HEART CATH AND CORONARY ANGIOGRAPHY;  Surgeon: Burnell Blanks, MD;  Location: [facility];  Service:  Cardiovascular;  Laterality: N/A;   LUMBAR LAMINECTOMY/DECOMPRESSION MICRODISCECTOMY Left 02/12/2013   Procedure: LUMBAR TWO THREE, LUMBAR THREE FOUR, LUMBAR FOUR FIVE  LAMINECTOMY/DECOMPRESSION MICRODISCECTOMY 3 LEVELS;  Surgeon: Charlie Pitter, MD;  Location: [facility];  Service: Neurosurgery;  Laterality: Left;   NEPHRECTOMY Right 2010   10.rcc cancer   TOTAL KNEE ARTHROPLASTY Right    Redo   Social History   Occupational History   Occupation: Retired    Fish farm manager: RETIRED  Tobacco Use   Smoking status: Former    Packs/day: 0.20    Years: 15.00    Total pack years: 3.00    Types: Cigarettes    Start date: 1970    Quit date: 09/07/2021    Years since quitting: 1.0   Smokeless tobacco: Never   Tobacco comments:    pt has been an on and off smoker since age 38  Vaping Use   Vaping Use: Never used  Substance and Sexual Activity   Alcohol use: No    Alcohol/week: 0.0 standard drinks of alcohol   Drug use: No   Sexual activity: Not Currently    Birth control/protection: Surgical    Comment: hyst

## 2022-10-12 NOTE — Patient Instructions (Addendum)
A few things to remember from today's visit:  Stage 3a chronic kidney disease ([place])  Polyneuropathy  Moderate episode of recurrent major depressive disorder ([place])  I need to know if you are seeing an urologist or nephrologist.  Try decreasing Gabapentin to just at night and see if you feel better during the day. No changes in Sertraline. If you are concerned about your memory,please arrange an appt with your neurologist. Memory Compensation Strategies  Use "WARM" strategy.  W= write it down  A= associate it  R= repeat it  M= make a mental note  2.   You can keep a Social worker.  Use a 3-ring notebook with sections for the following: calendar, important names and phone numbers,  medications, doctors' names/phone numbers, lists/reminders, and a section to journal what you did  each day.   3.    Use a calendar to write appointments down.  4.    Write yourself a schedule for the day.  This can be placed on the calendar or in a separate section of the Memory Notebook.  Keeping a  regular schedule can help memory.  5.    Use medication organizer with sections for each day or morning/evening pills.  You may need help loading it  6.    Keep a basket, or pegboard by the door.  Place items that you need to take out with you in the basket or on the pegboard.  You may also want to  include a message board for reminders.  7.    Use sticky notes.  Place sticky notes with reminders in a place where the task is performed.  For example: " turn off the  stove" placed by the stove, "lock the door" placed on the door at eye level, " take your medications" on  the bathroom mirror or by the place where you normally take your medications.  8.    Use alarms/timers.  Use while cooking to remind yourself to check on food or as a reminder to take your medicine, or as a  reminder to make a call, or as a reminder to perform another task, etc. .  If you need refills for medications you take  chronically, please call your pharmacy. Do not use My Chart to request refills or for acute issues that need immediate attention. If you send a my chart message, it may take a few days to be addressed, specially if I am not in the office.  Please be sure medication list is accurate. If a new problem present, please set up appointment sooner than planned today.

## 2022-10-15 DIAGNOSIS — Z85528 Personal history of other malignant neoplasm of kidney: Secondary | ICD-10-CM | POA: Diagnosis not present

## 2022-10-15 DIAGNOSIS — N3941 Urge incontinence: Secondary | ICD-10-CM | POA: Diagnosis not present

## 2022-10-16 NOTE — Assessment & Plan Note (Signed)
It just affected the extremity where vaccine was given , so it seems more a local reaction rather than allergic reaction. At this time she does not need more COVID 19 vaccines.

## 2022-10-17 DIAGNOSIS — G4733 Obstructive sleep apnea (adult) (pediatric): Secondary | ICD-10-CM | POA: Diagnosis not present

## 2022-10-17 NOTE — Progress Notes (Signed)
HPI F Smoker (1 pk/ week, 15 pkyrs followed for OSA, Bronchiectasis, complicated by  Orthostasis, HTN, CAD/ stent, Allergic Rhinitis, GERD,  NPSG 10/07/18- AHI 65.8/ hr, desaturation to 89%, body weight 209 lbs HST 02/09/21- AHI 50.5/ hr, desaturation to a nadir of 70%- average 87%, body weight 226 lbs PFT 05/04/21- Mild airtrapping but normal flows w/o resp to BD. DLCO is high for volume. HST 12/07/21- AHI 46.7/ hr, desaturation to 64%(mean 88%) body weight 226 lbs ===============================================================  04/20/22- 76 yoF former smoker (1 pk/ week, 15 pkyrs) followed for OSA, COPD/Bronchiectasis, complicated by  Orthostasis, HTN, CAD/ stent, Aortic Atherosclerosis, Allergic Rhinitis, GERD, Depression,  Son is [name] -Ventolin hfa, Singulair,Trelegy 100, Flonase,  CPAP auto 4-10/ [facility] Intolerant of CPAP mask- referred for mask fitting by [facility] DME on 2/17 Download-compliance - Body weight today Covid vax-2 Phizer Flu vax-today senior -----Pt f/u for COPD, having SOB on exertion. Having increased nasal congestion was told in the past she is allergic to ragweed.  Checking on delivery status of CPAP from [facility]. She says problem before was mask fit and I explained we can help if that is a problem. She is using Ventolin rescue inhaler every night at bedtime for wheeze and sometimes needs it during day. Doesn't remember effect of Trelegy sample. Discussed trial of generic Symbicort maintenance, which should be cheaper. Little productive cough lately.   10/19/22-  34 yoF former smoker (1 pk/ week, 15 pkyrs) followed for OSA, COPD/Bronchiectasis, complicated by  Orthostasis, HTN, CAD/ stent, Aortic Atherosclerosis, Allergic Rhinitis, GERD, Depression, Memory Disturbance, Hx R Nephrectomy/ Renal Cell Ca,  Son is [name] -Ventolin hfa, Singulair,Breztri, Flonase,  CPAP auto 4-10/ [facility] Intolerant of CPAP mask- referred for mask fitting by [facility] DME on  09/18/21 Download-compliance -77%, AHI 3.3/ hr Body weight today 230 lbs Covid vax-2 Phizer Flu vax-had Download reviewed.  She likes her nasal mask much better than the previous fullface mask.  We discussed comfort and compliance goals.  She is sleeping better. Markus Daft is definitely helping her breathing.  She is getting manufacturer's assistance with this and does not need changes. CXR   06/14/22- MPRESSION: Stable chest with no radiographic evidence of acute cardiopulmonary process.  ROS-see HPI   + = positive Constitutional:    weight loss, night sweats, fevers, chills, fatigue, lassitude. HEENT:    headaches, +difficulty swallowing, tooth/dental problems, sore throat,       sneezing, itching, ear ache, +nasal congestion, post nasal drip, snoring CV:    chest pain, orthopnea, PND, swelling in lower extremities, anasarca,      dizziness, palpitations Resp:   +shortness of breath with exertion or at rest.                productive cough,   non-productive cough, coughing up of blood.              change in color of mucus.  wheezing.   Skin:    rash or lesions. GI:  No-   heartburn, indigestion, abdominal pain, nausea, vomiting, diarrhea,                 change in bowel habits, loss of appetite GU: dysuria, change in color of urine, no urgency or frequency.   flank pain. MS:   +joint pain, stiffness, decreased range of motion, back pain. Neuro-     +dizzy Psych:  change in mood or affect.  depression or anxiety.   memory loss.  OBJ- Physical Exam General- Alert, Oriented,  Affect-appropriate, Distress- none acute, + obese Skin- +spots of vit[place] Lymphadenopathy- none Head- atraumatic            Eyes- Gross vision intact, PERRLA, conjunctivae and secretions clear            Ears- Hearing, canals-normal            Nose- Clear, no-Septal dev, mucus, polyps, erosion, perforation             Throat- Mallampati III, mucosa clear , drainage- none, +tonsils, + teeth Neck- flexible ,  trachea midline, no stridor , thyroid nl, carotid no bruit Chest - symmetrical excursion , unlabored           Heart/CV- RRR , no murmur , no gallop  , no rub, nl s1 s2                           - JVD- none , edema- none, stasis changes- none, varices- none           Lung- +clear, wheeze- none, cough- none , dullness-none, rub- none           Chest wall-  Abd-  Br/ Gen/ Rectal- Not done, not indicated Extrem- cyanosis- none, clubbing, none, atrophy- none, strength- nl Neuro- grossly intact to observation

## 2022-10-18 ENCOUNTER — Other Ambulatory Visit: Payer: Self-pay | Admitting: Physician Assistant

## 2022-10-18 ENCOUNTER — Other Ambulatory Visit: Payer: Self-pay | Admitting: Neurology

## 2022-10-18 ENCOUNTER — Other Ambulatory Visit: Payer: Self-pay | Admitting: Family Medicine

## 2022-10-18 DIAGNOSIS — I119 Hypertensive heart disease without heart failure: Secondary | ICD-10-CM

## 2022-10-18 DIAGNOSIS — K219 Gastro-esophageal reflux disease without esophagitis: Secondary | ICD-10-CM

## 2022-10-19 ENCOUNTER — Ambulatory Visit: Payer: Self-pay

## 2022-10-19 ENCOUNTER — Ambulatory Visit (INDEPENDENT_AMBULATORY_CARE_PROVIDER_SITE_OTHER): Payer: Medicare HMO | Admitting: Sports Medicine

## 2022-10-19 ENCOUNTER — Ambulatory Visit: Payer: Medicare HMO | Admitting: Internal Medicine

## 2022-10-19 ENCOUNTER — Telehealth: Payer: Self-pay

## 2022-10-19 ENCOUNTER — Encounter: Payer: Self-pay | Admitting: Internal Medicine

## 2022-10-19 VITALS — BP 126/72 | HR 79 | Ht 66.0 in | Wt 230.4 lb

## 2022-10-19 DIAGNOSIS — M25551 Pain in right hip: Secondary | ICD-10-CM | POA: Diagnosis not present

## 2022-10-19 DIAGNOSIS — M1611 Unilateral primary osteoarthritis, right hip: Secondary | ICD-10-CM | POA: Diagnosis not present

## 2022-10-19 DIAGNOSIS — J449 Chronic obstructive pulmonary disease, unspecified: Secondary | ICD-10-CM

## 2022-10-19 DIAGNOSIS — G4733 Obstructive sleep apnea (adult) (pediatric): Secondary | ICD-10-CM | POA: Diagnosis not present

## 2022-10-19 MED ORDER — LIDOCAINE HCL 1 % IJ SOLN
4.0000 mL | INTRAMUSCULAR | Status: AC | PRN
  Administered 2022-10-19: 4 mL

## 2022-10-19 MED ORDER — METHYLPREDNISOLONE ACETATE 40 MG/ML IJ SUSP
80.0000 mg | INTRAMUSCULAR | Status: AC | PRN
  Administered 2022-10-19: 80 mg via INTRA_ARTICULAR

## 2022-10-19 NOTE — Patient Instructions (Signed)
We can continue CPAP auto 4-10  We can continue your current breathing meds  Walk as much as you can, to keep your stamina up

## 2022-10-19 NOTE — Progress Notes (Signed)
   Procedure Note  Patient: [name]             Date of Birth: [birth year]           MRN: [id number]             Visit Date: 10/19/2022  Procedures: Visit Diagnoses:  1. Pain in right hip    Large Joint Inj: R hip joint on 10/19/2022 10:32 AM Indications: pain Details: 22 G 3.5 in needle, ultrasound-guided anterior approach Medications: 4 mL lidocaine 1 %; 80 mg methylPREDNISolone acetate 40 MG/ML Outcome: tolerated well, no immediate complications  Procedure: US-guided intra-articular hip injection, right After discussion on risks/benefits/indications and informed verbal consent was obtained, a timeout was performed. Patient was lying supine on exam table. The hip was cleaned with betadine and alcohol swabs. Then utilizing ultrasound guidance, the patient's femoral head and neck junction was identified and subsequently injected with 4:2 lidocaine:depomedrol via an in-plane approach with ultrasound visualization of the injectate administered into the hip joint. Patient tolerated procedure well without immediate complications.  Procedure, treatment alternatives, risks and benefits explained, specific risks discussed. Consent was given by the patient. Immediately prior to procedure a time out was called to verify the correct patient, procedure, equipment, support staff and site/side marked as required. Patient was prepped and draped in the usual sterile fashion.     - I evaluated the patient about 10 minutes post-injection and she had rather good improvement in pain and range of motion - follow-up with Dr. Erlinda Hong as indicated; I am happy to see them as needed  Elba Barman, DO [facility]  This note was dictated using Dragon naturally speaking software and may contain errors in syntax, spelling, or content which have not been identified prior to signing this note.

## 2022-10-19 NOTE — Progress Notes (Signed)
Care Management & Coordination Services Pharmacy Team  Reason for Encounter: Appointment Reminder  Contacted patient to confirm telephone appointment with Burman Riis, PharmD on 10/20/2022 at 1:00. Spoke with patient on 10/19/2022   Do you have any problems getting your medications? Patient denies  What is your top health concern you would like to discuss at your upcoming visit? Patient denies any concerns at this time.  Have you seen any other providers since your last visit with PCP? Patient denies  Care Gaps: AWV - completed 09/08/2022, scheduled 09/19/2023 Last eye exam - 04/08/2021 Last foot exam - 08/19/2021 Last A1C - 6.1 on 09/27/2022 Urine ACR - overdue Tdap - overdue Shingrix - postponed   Star Rating Drugs: Losartan 100 mg - last filled 09/28/2022 30 DS at [facility] Rosuvastatin 20 mg - last filled 09/28/2022 30 DS at [facility] [phone]

## 2022-10-19 NOTE — Progress Notes (Signed)
Care Management & Coordination Services Pharmacy Note  10/19/2022 Name:  KI BAYONA MRN:  [id number] DOB:  February 20, 1946  Summary: Breathing well controlled with current regimen LDL at goal <70 Concerned about myrbetriq cost  Recommendations/Changes made from today's visit: -Continue inhaler therapy as directed -Continue praluent injection -Pursue Myrbetriq PAP for patinet  Follow up plan: Pharmacist visit in 6 months   Subjective: [name] is an 77 y.o. year old female who is a primary patient of [name], Malka So, MD.  The care coordination team was consulted for assistance with disease management and care coordination needs.    Engaged with patient by telephone for follow up visit.  Recent office visits: 10/12/22 [name], MD - For memory disturbance, no med changes  09/27/22 [name], MD - T2DM, No med changes  09/08/2022 Rolene Arbour LPN - Medicare annual wellness exam   Recent consult visits: 09/20/22 Carmon Sails, MD (Neuro) - Phone - restarted Gabapentin 300mg  BID  09/02/2022 Dorris Carnes MD (cardiology) - Patient was seen for Hyperlipidemia associated with type 2 diabetes mellitus and fluttering heart. No medication changes.    09/02/2022 [place] - Patient was seen for Major depressive disorder, recurrent episode, moderate . No medication changes.    08/20/2022 Vito Cirigliano DO (GI) - Patient was seen for Diarrhea, unspecified type and additional concerns. [facility] visits: None in previous 6 months   Objective:  Lab Results  Component Value Date   CREATININE 1.65 (H) 07/05/2022   BUN 19 07/05/2022   GFR 29.60 (L) 07/05/2022   GFRNONAA 34 (L) 06/14/2022   GFRAA 40 (L) 07/07/2020   NA 139 07/05/2022   K 3.6 07/05/2022   CALCIUM 9.6 07/05/2022   CO2 29 07/05/2022   GLUCOSE 118 (H) 07/05/2022    Lab Results  Component Value Date/Time   HGBA1C 6.1 (A) 09/27/2022 01:03 PM   HGBA1C 6.3 03/24/2022 03:14 PM    HGBA1C 5.9 04/17/2021 12:47 PM   FRUCTOSAMINE 226 11/10/2020 09:26 AM   FRUCTOSAMINE 257 03/26/2019 10:54 AM   GFR 29.60 (L) 07/05/2022 03:27 PM   GFR 30.44 (L) 08/19/2021 10:56 AM   MICROALBUR 5.8 03/26/2020 10:55 AM   MICROALBUR 16.9 (H) 09/26/2019 01:02 PM    Last diabetic Eye exam:  Lab Results  Component Value Date/Time   HMDIABEYEEXA No Retinopathy 04/28/2021 12:00 AM    Last diabetic Foot exam: No results found for: "HMDIABFOOTEX"   Lab Results  Component Value Date   CHOL 143 09/11/2020   HDL 56 09/11/2020   LDLCALC 69 09/11/2020   LDLDIRECT 171.5 12/06/2011   TRIG 98 09/11/2020   CHOLHDL 2.6 09/11/2020       Latest Ref Rng & Units 07/05/2022    3:27 PM 06/10/2022   11:03 PM 11/05/2021    4:41 PM  Hepatic Function  Total Protein 6.0 - 8.3 g/dL 7.8  7.4  8.1   Albumin 3.5 - 5.2 g/dL 4.1  3.8  3.7   AST 0 - 37 U/L 17  16  29    ALT 0 - 35 U/L 11  10  17    Alk Phosphatase 39 - 117 U/L 55  42  57   Total Bilirubin 0.2 - 1.2 mg/dL 0.5  0.5  0.5     Lab Results  Component Value Date/Time   TSH 2.16 07/05/2022 03:27 PM   TSH 2.91 11/10/2020 09:26 AM       Latest Ref Rng & Units 07/05/2022  3:27 PM 06/14/2022    9:40 AM 06/10/2022   11:03 PM  CBC  WBC 4.0 - 10.5 K/uL 7.9  8.2  8.6   Hemoglobin 12.0 - 15.0 g/dL 14.5  13.8  13.3   Hematocrit 36.0 - 46.0 % 42.9  42.8  40.4   Platelets 150.0 - 400.0 K/uL 250.0  267  268     Lab Results  Component Value Date/Time   VD25OH 28 (L) 03/26/2020 10:41 AM   VD25OH 17.09 (L) 09/26/2019 01:02 PM   VD25OH 15.71 (L) 03/26/2019 10:54 AM   VITAMINB12 232 11/10/2020 09:26 AM   VITAMINB12 555 12/01/2016 09:06 AM    Clinical ASCVD: Yes  The ASCVD Risk score (Arnett DK, et al., 2019) failed to calculate for the following reasons:   The valid total cholesterol range is 130 to 320 mg/dL       09/27/2022   12:23 PM 09/08/2022    2:28 PM 09/08/2022    2:06 PM  Depression screen PHQ 2/9  Decreased Interest 1 1 0  Down,  Depressed, Hopeless 1 1 0  PHQ - 2 Score 2 2 0  Altered sleeping 1 0   Tired, decreased energy 1 1   Change in appetite 1 0   Feeling bad or failure about yourself  1 0   Trouble concentrating 1 0   Moving slowly or fidgety/restless 0 0   Suicidal thoughts 0 0   PHQ-9 Score 7 3   Difficult doing work/chores Somewhat difficult Not difficult at all      Social History   Tobacco Use  Smoking Status Former   Packs/day: 0.20   Years: 15.00   Additional pack years: 0.00   Total pack years: 3.00   Types: Cigarettes   Start date: 14   Quit date: 09/07/2021   Years since quitting: 1.1  Smokeless Tobacco Never  Tobacco Comments   pt has been an on and off smoker since age 59   BP Readings from Last 3 Encounters:  10/19/22 126/72  10/12/22 128/80  09/27/22 120/76   Pulse Readings from Last 3 Encounters:  10/19/22 79  10/12/22 82  09/27/22 64   Wt Readings from Last 3 Encounters:  10/19/22 230 lb 6.4 oz (104.5 kg)  10/12/22 232 lb (105.2 kg)  09/27/22 230 lb 6 oz (104.5 kg)   BMI Readings from Last 3 Encounters:  10/19/22 37.19 kg/m  10/12/22 37.45 kg/m  09/27/22 37.18 kg/m    Allergies  Allergen Reactions   Oxycodone-Acetaminophen Hives and Itching    hallucinations   Percocet [Oxycodone-Acetaminophen] Hives, Itching and Other (See Comments)    hallucinations   Pravastatin Sodium Other (See Comments)    Muscle aches Muscle aches   Hydrocodone Other (See Comments)    "crazy dreams"   Aspirin Other (See Comments)    GI upset Other reaction(s): Other (See Comments) REACTION: GI upset    Medications Reviewed Today     Reviewed by Miguel Dibble, CMA (Certified Medical Assistant) on 10/19/22 at 24  Med List Status: <None>   Medication Order Taking? Sig Documenting Provider Last Dose Status Informant  acetaminophen (TYLENOL) 500 MG tablet [id number] Yes Take 1,000 mg by mouth as needed for moderate pain or headache.  [provider] Taking Active  Self           Med Note Shari Heritage Feb 01, 2022 10:53 AM)    albuterol (VENTOLIN HFA) 108 [phone] Base) MCG/ACT inhaler [id number] Yes  INHALE ONE OR TWO PUFFS BY MOUTH INTO THE LUNGS EVERY SIX HOURS AS NEEDED FOR WHEEZING OR SHORTNESS OF Kerby Less, MD Taking Active   aspirin 81 MG chewable tablet [id number] Yes Chew 81 mg by mouth daily. [provider] Taking Active Self  Budeson-Glycopyrrol-Formoterol (BREZTRI AEROSPHERE) 160-9-4.8 MCG/ACT AERO [id number] Yes Inhale 2 puffs into the lungs in the morning and at bedtime. Deneise Lever, MD Taking Active   budesonide-formoterol [facility]) 80-4.5 MCG/ACT inhaler [id number] Yes Inhale 2 puffs then rinse mouth, twice daily Baird [name] D, MD Taking Active   COVID-19 mRNA vaccine [phone] (COMIRNATY) syringe [id number] Yes Inject into the muscle.  Taking Active   famotidine (PEPCID) 20 MG tablet [id number] Yes TAKE ONE TABLET BY MOUTH EVERY MORNING and TAKE ONE TABLET BY MOUTH EVERYDAY AT BEDTIME [name], [name], MD Taking Active   fluticasone (FLONASE) 50 MCG/ACT nasal spray [id number] Yes PLACE 2 SPRAYS INTO BOTH NOSTRILS AT BEDTIME AS NEEDED FOR ALLERGIES. [name], [name], MD Taking Active   gabapentin (NEURONTIN) 300 MG capsule [id number] Yes Take 1 capsule (300 mg total) by mouth 2 (two) times daily. Melvenia Beam, MD Taking Active   hydrochlorothiazide (HYDRODIURIL) 25 MG tablet [id number] Yes TAKE ONE TABLET BY MOUTH ONCE DAILY [name], [name], MD Taking Active   losartan (COZAAR) 100 MG tablet [id number] Yes TAKE ONE TABLET BY MOUTH EVERY MORNING [name], [name], MD Taking Active   metoprolol succinate (TOPROL-XL) 100 MG 24 hr tablet [id number] Yes TAKE ONE TABLET BY MOUTH AT NOON [name], [name], MD Taking Active   montelukast (SINGULAIR) 10 MG tablet [id number] Yes TAKE ONE TABLET BY MOUTH EVERYDAY AT BEDTIME [name], [name], MD Taking Active   MYRBETRIQ 50 MG TB24 tablet [id number] Yes Take 50 mg by mouth daily.  [provider] Taking Active   nitroGLYCERIN (NITROSTAT) 0.4 MG SL tablet [id number] Yes Place 1 tablet (0.4 mg total) under the tongue every 5 (five) minutes as needed for chest pain. Fay Records, MD Taking Active   ondansetron (ZOFRAN-ODT) 4 MG disintegrating tablet [id number] Yes 4mg  ODT q4 hours prn nausea/vomit Deno Etienne, DO Taking Active   pantoprazole (PROTONIX) 40 MG tablet [id number] Yes TAKE ONE TABLET BY MOUTH ONCE DAILY Cirigliano, Vito V, DO Taking Active   PRALUENT 75 MG/ML SOAJ [id number] Yes inject 75mg  into THE SKIN every 14 DAYS Fay Records, MD Taking Active   rosuvastatin (CRESTOR) 20 MG tablet [id number] Yes Take 1 tablet (20 mg total) by mouth at bedtime. Fay Records, MD Taking Active   sertraline (ZOLOFT) 50 MG tablet [id number] Yes Take 1 tablet (50 mg total) by mouth daily. [name], [name], MD Taking Active   traMADol Veatrice Bourbon) 50 MG tablet [id number] Yes Take 1 tablet (50 mg total) by mouth every 6 (six) hours as needed. Erroll Luna, MD Taking Active             SDOH:  (Social Determinants of Health) assessments and interventions performed: Yes SDOH Interventions    Flowsheet Row Clinical Support from 09/08/2022 in [place] at [place] Management from 05/21/2022 in [place] at [place] from 11/25/2021 in [place] at [place] from 08/27/2021 in [place] at [place] Management from 05/08/2021 in [place] at [place] from 08/26/2020 in [place] at [place]  Interventions Intervention Not Indicated -- -- -- -- Intervention Not Indicated  Housing Interventions Intervention Not Indicated -- -- -- -- Intervention Not Indicated  Transportation Interventions Intervention Not Indicated -- -- -- Intervention Not  Indicated Intervention Not Indicated  Utilities Interventions Intervention Not Indicated -- -- -- -- --  Alcohol Usage Interventions Intervention Not Indicated (Score <7) -- -- -- -- --  Depression Interventions/Treatment  Currently on Treatment -- Medication, Counseling Counseling -- --  Financial Strain Interventions Intervention Not Indicated Other (Comment)  [working on assistance for inhaler] -- -- Intervention Not Indicated Intervention Not Indicated  Physical Activity Interventions Intervention Not Indicated -- -- -- -- Intervention Not Indicated  Stress Interventions Other (Comment)  [Currently receiving counseling] -- -- -- -- Intervention Not Indicated  Social Connections Interventions Intervention Not Indicated -- -- -- -- Intervention Not Indicated       Medication Assistance:  Gets Breztri, applying for myrbetriq  Medication Access: Within the past 30 days, how often has patient missed a dose of medication? None Is a pillbox or other method used to improve adherence? Yes  Factors that may affect medication adherence? financial need Are meds synced by current pharmacy? Yes  Are meds delivered by current pharmacy? Yes  Does patient experience delays in picking up medications due to transportation concerns? No   [facility] Reviewed: Is patient disadvantaged to use [facility]?: No  Current Rx insurance plan: [employer] Name and location of Current pharmacy:  [facility] [place], [place] [address] Phone: [phone] Fax: [place], [place] [address] Phone: [phone] Fax: [phone]  [id number] - [place], [place] [address] Phone: [phone] Fax: [phone]  [facility] [place], [place] - [address] [place] [address] Phone: [phone] Fax: [place], [place]. [place] [address] Phone: [phone] Fax: [phone]  [facility] services reviewed with patient today?: No  Patient requests to transfer care to [facility]?: No  Reason patient declined to change pharmacies: Patient is already actively enrolled with [facility]  Compliance/Adherence/Medication fill history: Care Gaps: AWV - completed 09/08/2022, scheduled 09/19/2023 Last eye exam - 04/08/2021 Last foot exam - 08/19/2021 Last A1C - 6.1 on 09/27/2022 Urine ACR - overdue Tdap - overdue Shingrix - postponed  Star-Rating Drugs: Losartan 100 mg - last filled 09/28/2022 30 DS at [facility] Rosuvastatin 20 mg - last filled 09/28/2022 30 DS at [facility]   Assessment/Plan COPD (Goal: control symptoms and prevent exacerbations) -Controlled -Current treatment  Breztri 2 puffs twice daily Appropriate, Effective, Safe, Accessible Albuterol HFA 2 puffs q 6h prn Appropriate, Effective, Safe, Accessible -Medications previously tried: Symbicort  -Gold Grade: Gold 2 (FEV1 50-79%) -Current COPD Classification:  A (low sx, 0-1 moderate exacerbations, no hospitalizations) -Exacerbations requiring treatment in last 6 months: No -Patient reports consistent use of maintenance inhaler -Frequency of rescue inhaler use: twice daily -Counseled on Differences between maintenance and rescue inhalers -Recommended to continue current medication   Hyperlipidemia: (LDL goal < 70) -Controlled -Current treatment: Praluent once every 2 weeks Appropriate, Effective, Safe, Accessible -Medications previously tried: Rosuvastatin  -  Current dietary patterns: Not discussed -Current exercise habits: None currently, [facility] -Educated on Cholesterol  goals;  Importance of limiting foods high in cholesterol; Exercise goal of 150 minutes per week; -Recommended to continue current medication  [place] Pharmacist [phone]

## 2022-10-20 ENCOUNTER — Ambulatory Visit: Payer: Medicare HMO

## 2022-10-21 ENCOUNTER — Ambulatory Visit (INDEPENDENT_AMBULATORY_CARE_PROVIDER_SITE_OTHER): Payer: Medicare HMO | Admitting: Psychologist

## 2022-10-21 DIAGNOSIS — R69 Illness, unspecified: Secondary | ICD-10-CM | POA: Diagnosis not present

## 2022-10-21 DIAGNOSIS — F331 Major depressive disorder, recurrent, moderate: Secondary | ICD-10-CM | POA: Diagnosis not present

## 2022-10-21 NOTE — Progress Notes (Signed)
[place] Counselor/Therapist Progress Note  Patient ID: [name], MRN: [id number],    Date: 10/21/2022  Time Spent: 02:02 pm to 02:42 pm; total time: 40 minutes   This session was held via in person. The patient consented to in-person therapy and was in the clinician's office. Limits of confidentiality were discussed with the patient.   Treatment Type: Individual Therapy  Reported Symptoms: Described herself as doing well  Mental Status Exam: Appearance:  Well Groomed     Behavior: Appropriate  Motor: Normal  Speech/Language:  Clear and Coherent  Affect: Appropriate  Mood: normal  Thought process: normal  Thought content:   WNL  Sensory/Perceptual disturbances:   WNL  Orientation: oriented to person, place, and time/date  Attention: Good  Concentration: Good  Memory: WNL  Fund of knowledge:  Good  Insight:   Fair  Judgment:  Good  Impulse Control: Good   Risk Assessment: Danger to Self:  No Self-injurious Behavior: No Danger to Others: No Duty to Warn:no Physical Aggression / Violence:No  Access to Firearms a concern: No  Gang Involvement:No   Subjective: Beginning the session, patient stated that she has been doing well and reflected on what has helped. She reflected on her experience in counseling. She also reflected on the difference between experiencing sadness and depression along with other emotions. She processed thoughts and emotions. She asked to follow up. She denied suicidal and homicidal ideation.    Interventions:  Worked on developing a therapeutic relationship with the patient using active listening and reflective statements. Provided emotional support using empathy and validation. Reviewed the treatment plan with the patient. Explored plans for the upcoming weekend. Praised the patient for doing well and explored what has assisted the patient. Praised patient for the steps that she has taken. Processed thoughts and emotions. Used  metaphors to assist the patient. Used socratic questions to assist the patient. Processed the difference between depression and sadness. Challenged thoughts. Used metaphor of emotions like a weather pattern. Reflected on patient's growth in counseling. Discussed next steps. Provided empathic statements. Assessed for suicidal and homicidal ideation.   Homework: NA  Next Session: Emotional support and process transition  Diagnosis: F33.1 major depressive affective disorder, recurrent, moderate  Plan:   Goals Alleviate depressive symptoms Recognize, accept, and cope with depressive feelings Develop healthy thinking patterns Develop healthy interpersonal relationships  Objectives target date for all objectives is 04/13/2023 Cooperate with a medication evaluation by a physician Verbalize an accurate understanding of depression Verbalize an understanding of the treatment Identify and replace thoughts that support depression Learn and implement behavioral strategies Verbalize an understanding and resolution of current interpersonal problems Learn and implement problem solving and decision making skills Learn and implement conflict resolution skills to resolve interpersonal problems Verbalize an understanding of healthy and unhealthy emotions verbalize insight into how past relationships may be influence current experiences with depression Use mindfulness and acceptance strategies and increase value based behavior  Increase hopeful statements about the future.  Interventions Consistent with treatment model, discuss how change in cognitive, behavioral, and interpersonal can help client alleviate depression CBT Behavioral activation help the client explore the relationship, nature of the dispute,  Help the client develop new interpersonal skills and relationships Conduct Problem so living therapy Teach conflict resolution skills Use a process-experiential approach Conduct TLDP Conduct  ACT Evaluate need for psychotropic medication Monitor adherence to medication   The patient and clinician reviewed the treatment plan on 04/26/2022. The patient approved of the treatment plan.  Saafir Abdullah, PsyD 

## 2022-10-26 NOTE — Progress Notes (Deleted)
ACUTE VISIT No chief complaint on file.  HPI: [name] is a 77 y.o. female, who is here today complaining of *** HPI  Review of Systems See other pertinent positives and negatives in HPI.  Current Outpatient Medications on File Prior to Visit  Medication Sig Dispense Refill   acetaminophen (TYLENOL) 500 MG tablet Take 1,000 mg by mouth as needed for moderate pain or headache.      albuterol (VENTOLIN HFA) 108 (90 Base) MCG/ACT inhaler INHALE ONE OR TWO PUFFS BY MOUTH INTO THE LUNGS EVERY SIX HOURS AS NEEDED FOR WHEEZING OR SHORTNESS OF BREATH 8.5 g 12   aspirin 81 MG chewable tablet Chew 81 mg by mouth daily.     Budeson-Glycopyrrol-Formoterol (BREZTRI AEROSPHERE) 160-9-4.8 MCG/ACT AERO Inhale 2 puffs into the lungs in the morning and at bedtime. 2 each 11   COVID-19 mRNA vaccine 2023-2024 (COMIRNATY) syringe Inject into the muscle. 0.3 mL 0   famotidine (PEPCID) 20 MG tablet TAKE ONE TABLET BY MOUTH EVERY MORNING and TAKE ONE TABLET BY MOUTH EVERYDAY AT BEDTIME 180 tablet 2   fluticasone (FLONASE) 50 MCG/ACT nasal spray PLACE 2 SPRAYS INTO BOTH NOSTRILS AT BEDTIME AS NEEDED FOR ALLERGIES. 48 mL 1   gabapentin (NEURONTIN) 300 MG capsule Take 1 capsule (300 mg total) by mouth 2 (two) times daily. 60 capsule 0   hydrochlorothiazide (HYDRODIURIL) 25 MG tablet TAKE ONE TABLET BY MOUTH ONCE DAILY 90 tablet 2   losartan (COZAAR) 100 MG tablet TAKE ONE TABLET BY MOUTH EVERY MORNING 90 tablet 2   metoprolol succinate (TOPROL-XL) 100 MG 24 hr tablet TAKE ONE TABLET BY MOUTH AT NOON 90 tablet 2   montelukast (SINGULAIR) 10 MG tablet TAKE ONE TABLET BY MOUTH EVERYDAY AT BEDTIME 30 tablet 3   MYRBETRIQ 50 MG TB24 tablet Take 50 mg by mouth daily.     nitroGLYCERIN (NITROSTAT) 0.4 MG SL tablet Place 1 tablet (0.4 mg total) under the tongue every 5 (five) minutes as needed for chest pain. 25 tablet 2   ondansetron (ZOFRAN-ODT) 4 MG disintegrating tablet 4mg  ODT q4 hours prn nausea/vomit 20  tablet 0   pantoprazole (PROTONIX) 40 MG tablet TAKE ONE TABLET BY MOUTH ONCE DAILY 90 tablet 3   PRALUENT 75 MG/ML SOAJ inject 75mg  into THE SKIN every 14 DAYS 2 mL 11   rosuvastatin (CRESTOR) 20 MG tablet Take 1 tablet (20 mg total) by mouth at bedtime. 90 tablet 3   sertraline (ZOLOFT) 50 MG tablet Take 1 tablet (50 mg total) by mouth daily. 90 tablet 1   traMADol (ULTRAM) 50 MG tablet Take 1 tablet (50 mg total) by mouth every 6 (six) hours as needed. 20 tablet 0   No current facility-administered medications on file prior to visit.    Past Medical History:  Diagnosis Date   Allergy    Anxiety    C. difficile diarrhea    history of   CAD (coronary artery disease), native coronary artery    a. 05/2017 showed 100% mLCx tx with DES, otherwise 20% mRCA, 50% ramus, 30% dLAD, 20% prox-mid LAD, 20% mLAD treated medically.    Cancer [facility])    Right kidney CA removed.    CKD (chronic kidney disease) stage 3, GFR 30-59 ml/min (HCC) 10/11/2016   s/p R nephrectomy   Colon polyps 2008   HYPERPLASTIC   COPD (chronic obstructive pulmonary disease) (HCC)    former smoker   Gastritis    GERD (gastroesophageal reflux disease)  History of echocardiogram    Echo 3/18:  Moderate LVH, EF [id number], grade 1 diastolic dysfunction, calcified aortic valve, mild MR, moderate LAE   History of nuclear stress test    Myoview 3/18: Mod size and intensity fixed septal defect, may be artifact.Opposite mod size and intensity lat defect, which is reversible and could represent ischemia or possibly artifact (SDS 4). LVEF 71% with normal wall motion. Intermediate risk study. >> images reviewed with Dr. Dorris Carnes - no sig ischemia; med rx    Hx of cardiovascular stress test    Lexiscan Myoview 6/16:  EF 70%, no scar or ischemia; Low Risk   Hyperlipidemia    Hypertension    Neuropathy    Orthostatic hypotension 05/28/2017   OSA (obstructive sleep apnea) 01/16/2021   Osteoarthritis    Pre-diabetes    no meds    Rheumatoid arthritis (HCC)    RA (Dr. Ouida Sills) Bilateral hands   S/P angioplasty with stent 05/27/17 to LCX with DES  05/28/2017   Sleep apnea 2023   does not use CPAP   Thyroid disease    Allergies  Allergen Reactions   Oxycodone-Acetaminophen Hives and Itching    hallucinations   Percocet [Oxycodone-Acetaminophen] Hives, Itching and Other (See Comments)    hallucinations   Pravastatin Sodium Other (See Comments)    Muscle aches Muscle aches   Hydrocodone Other (See Comments)    "crazy dreams"   Aspirin Other (See Comments)    GI upset Other reaction(s): Other (See Comments) REACTION: GI upset    Social History   Socioeconomic History   Marital status: Divorced    Spouse name: Not on file   Number of children: 3   Years of education: 12   Highest education level: Not on file  Occupational History   Occupation: Retired    Fish farm manager: RETIRED  Tobacco Use   Smoking status: Former    Packs/day: 0.20    Years: 15.00    Additional pack years: 0.00    Total pack years: 3.00    Types: Cigarettes    Start date: 61    Quit date: 09/07/2021    Years since quitting: 1.1   Smokeless tobacco: Never   Tobacco comments:    pt has been an on and off smoker since age 42  Vaping Use   Vaping Use: Never used  Substance and Sexual Activity   Alcohol use: No    Alcohol/week: 0.0 standard drinks of alcohol   Drug use: No   Sexual activity: Not Currently    Birth control/protection: Surgical    Comment: hyst  Other Topics Concern   Not on file  Social History Narrative   Single, dgtr lives with her   Current on/off smoker   Alcohol use- no   Drug use-no   Regular Exercise-yes   Social Determinants of Health   Financial Resource Strain: Low Risk  (09/08/2022)   Overall Financial Resource Strain (CARDIA)    Difficulty of Paying Living Expenses: Not hard at all  Food Insecurity: No Food Insecurity (10/20/2022)   Hunger Vital Sign    Worried About Running Out of Food in the  Last Year: Never true    Ran Out of Food in the Last Year: Never true  Transportation Needs: No Transportation Needs (09/08/2022)   PRAPARE - Hydrologist (Medical): No    Lack of Transportation (Non-Medical): No  Physical Activity: Inactive (09/08/2022)   Exercise Vital Sign    Days  of Exercise per Week: 0 days    Minutes of Exercise per Session: 0 min  Stress: Stress Concern Present (09/08/2022)   Amberrose Friebel    Feeling of Stress : To some extent  Social Connections: Moderately Integrated (09/08/2022)   Social Connection and Isolation Panel [NHANES]    Frequency of Communication with Friends and Family: More than three times a week    Frequency of Social Gatherings with Friends and Family: More than three times a week    Attends Religious Services: More than 4 times per year    Active Member of [employer] or Organizations: Yes    Attends Music therapist: More than 4 times per year    Marital Status: Divorced    There were no vitals filed for this visit. There is no height or weight on file to calculate BMI.  Physical Exam  ASSESSMENT AND PLAN: There are no diagnoses linked to this encounter.  No follow-ups on file.  [name]. [name], MD  [facility]. [place] office.  Discharge Instructions   None

## 2022-10-27 ENCOUNTER — Other Ambulatory Visit (HOSPITAL_COMMUNITY): Payer: Self-pay

## 2022-10-27 ENCOUNTER — Ambulatory Visit: Payer: Medicare HMO | Admitting: Family Medicine

## 2022-10-27 ENCOUNTER — Telehealth: Payer: Self-pay

## 2022-10-27 MED ORDER — REPATHA SURECLICK 140 MG/ML ~~LOC~~ SOAJ: 1.0000 mL | SUBCUTANEOUS | 11 refills | Status: DC

## 2022-10-27 NOTE — Telephone Encounter (Signed)
Pharmacy Patient Advocate Encounter  Prior Authorization for Repatha 140mg /ml has been approved by [facility] Medicare PARTD (ins).    PA # [id number] Effective dates: 1.1.24 through 12.31.24  Please note that the pts plan nolonger has ''Praluent as their preferred drug and its nolonger covered. Pt will need a new rx for repatha.

## 2022-10-27 NOTE — Telephone Encounter (Signed)
Patient made aware of change, [facility] Rep[facility].

## 2022-10-28 ENCOUNTER — Ambulatory Visit (INDEPENDENT_AMBULATORY_CARE_PROVIDER_SITE_OTHER): Payer: Medicare HMO | Admitting: Psychologist

## 2022-10-28 DIAGNOSIS — R69 Illness, unspecified: Secondary | ICD-10-CM | POA: Diagnosis not present

## 2022-10-28 DIAGNOSIS — F331 Major depressive disorder, recurrent, moderate: Secondary | ICD-10-CM

## 2022-10-28 NOTE — Progress Notes (Signed)
[place] Counselor/Therapist Progress Note  Patient ID: MAMTA AUGUST, MRN: [id number],    Date: 10/28/2022  Time Spent: 03:02 pm to 03:31 pm; total time: 29 minutes   This session was held via in person. The patient consented to in-person therapy and was in the clinician's office. Limits of confidentiality were discussed with the patient.   Treatment Type: Individual Therapy  Reported Symptoms: Denied symptoms  Mental Status Exam: Appearance:  Well Groomed     Behavior: Appropriate  Motor: Normal  Speech/Language:  Clear and Coherent  Affect: Appropriate  Mood: normal  Thought process: normal  Thought content:   WNL  Sensory/Perceptual disturbances:   WNL  Orientation: oriented to person, place, and time/date  Attention: Good  Concentration: Good  Memory: WNL  Fund of knowledge:  Good  Insight:   Fair  Judgment:  Good  Impulse Control: Good   Risk Assessment: Danger to Self:  No Self-injurious Behavior: No Danger to Others: No Duty to Warn:no Physical Aggression / Violence:No  Access to Firearms a concern: No  Gang Involvement:No   Subjective: Beginning the session, patient stated that she has been doing well and denied any symptoms. She reflected on previous events and upcoming events. From there, patient reflected on what she learned about herself in the counseling process. Specifically, patient learned to "look out for self". She reflected on the therapeutic experience. She identified with it starting slow and then getting better. She explored how the therapeutic relationship assisted her. She identified barriers and how to overcome those barriers. She processed thoughts and emotions. She denied suicidal and homicidal ideation.    Interventions:  Worked on developing a therapeutic relationship with the patient using active listening and reflective statements. Provided emotional support using empathy and validation. Reviewed the treatment plan with the  patient. Reviewed events since the last session and explored plans for the upcoming weekend. Normalized and validated thoughts and emotions expressed. Reflected on what the patient learned about herself in therapy. Assisted the patient in identifying barriers and how to overcome those barriers. Explored how the therapeutic relationship assisted the patient to grow. Validated that it took a while before a change occurred in the therapy process. Processed thoughts and emotions expressed. Provided psychoeducation about ongoing counseling services. Provided empathic statements. Assessed for suicidal and homicidal ideation.   Homework: NA  Next Session: NA. Patient will transition to new therapist as this clinician is leaving the healthcare system.   Diagnosis: F33.1 major depressive affective disorder, recurrent, moderate  Plan:   Goals Alleviate depressive symptoms Recognize, accept, and cope with depressive feelings Develop healthy thinking patterns Develop healthy interpersonal relationships  Objectives target date for all objectives is 04/13/2023 Cooperate with a medication evaluation by a physician Verbalize an accurate understanding of depression Verbalize an understanding of the treatment Identify and replace thoughts that support depression Learn and implement behavioral strategies Verbalize an understanding and resolution of current interpersonal problems Learn and implement problem solving and decision making skills Learn and implement conflict resolution skills to resolve interpersonal problems Verbalize an understanding of healthy and unhealthy emotions verbalize insight into how past relationships may be influence current experiences with depression Use mindfulness and acceptance strategies and increase value based behavior  Increase hopeful statements about the future.  Interventions Consistent with treatment model, discuss how change in cognitive, behavioral, and interpersonal  can help client alleviate depression CBT Behavioral activation help the client explore the relationship, nature of the dispute,  Help the client develop new interpersonal  skills and relationships Conduct Problem so living therapy Teach conflict resolution skills Use a process-experiential approach Conduct TLDP Conduct ACT Evaluate need for psychotropic medication Monitor adherence to medication   The patient and clinician reviewed the treatment plan on 04/26/2022. The patient approved of the treatment plan.   [name], PsyD

## 2022-11-01 NOTE — Telephone Encounter (Signed)
Pt requesting refill of gabapentin. Last saw vrp on 07/12/22. Routing to provider as this is a controlled substance.  Prior Dispenses:   Dispensed Days Supply Quantity Provider Pharmacy  gabapentin 300 mg capsule 09/28/2022 30 60 each Melvenia Beam, MD [facility]  gabapentin 300 mg capsule 04/29/2022 30 120 each Penumalli, Earlean Polka, MD [facility]  gabapentin 300 mg capsule 03/30/2022 30 120 each Penumalli, Earlean Polka, MD [facility]  gabapentin 300 mg capsule 02/25/2022 30 120 each Penumalli, Earlean Polka, MD [facility]  gabapentin 300 mg capsule 01/28/2022 30 120 each Penumalli, Earlean Polka, MD [facility]  gabapentin 300 mg capsule 12/28/2021 30 120 each Penumalli, Earlean Polka, MD [facility]  gabapentin 300 mg capsule 11/27/2021 30 120 each Penumalli, Earlean Polka, MD [facility]  gabapentin 300 mg capsule 10/30/2021 30 120 each Penumalli, Earlean Polka, MD [facility][name] KitchenMarland Kitchen

## 2022-11-08 ENCOUNTER — Telehealth: Payer: Self-pay | Admitting: Family Medicine

## 2022-11-08 DIAGNOSIS — Z1389 Encounter for screening for other disorder: Secondary | ICD-10-CM

## 2022-11-08 NOTE — Telephone Encounter (Signed)
Requesting a referral to see a dermatologist. 

## 2022-11-08 NOTE — Telephone Encounter (Signed)
Referral placed.

## 2022-11-16 NOTE — Progress Notes (Unsigned)
ACUTE VISIT No chief complaint on file.  HPI: [name] is a 77 y.o. female, who is here today complaining of *** HPI  Review of Systems See other pertinent positives and negatives in HPI.  Current Outpatient Medications on File Prior to Visit  Medication Sig Dispense Refill   acetaminophen (TYLENOL) 500 MG tablet Take 1,000 mg by mouth as needed for moderate pain or headache.      albuterol (VENTOLIN HFA) 108 (90 Base) MCG/ACT inhaler INHALE ONE OR TWO PUFFS BY MOUTH INTO THE LUNGS EVERY SIX HOURS AS NEEDED FOR WHEEZING OR SHORTNESS OF BREATH 8.5 g 12   aspirin 81 MG chewable tablet Chew 81 mg by mouth daily.     Budeson-Glycopyrrol-Formoterol (BREZTRI AEROSPHERE) 160-9-4.8 MCG/ACT AERO Inhale 2 puffs into the lungs in the morning and at bedtime. 2 each 11   COVID-19 mRNA vaccine 2023-2024 (COMIRNATY) syringe Inject into the muscle. 0.3 mL 0   Evolocumab (REPATHA SURECLICK) 140 MG/ML SOAJ Inject 140 mg into the skin every 14 (fourteen) days. 2 mL 11   famotidine (PEPCID) 20 MG tablet TAKE ONE TABLET BY MOUTH EVERY MORNING and TAKE ONE TABLET BY MOUTH EVERYDAY AT BEDTIME 180 tablet 2   fluticasone (FLONASE) 50 MCG/ACT nasal spray PLACE 2 SPRAYS INTO BOTH NOSTRILS AT BEDTIME AS NEEDED FOR ALLERGIES. 48 mL 1   gabapentin (NEURONTIN) 300 MG capsule TAKE ONE CAPSULE BY MOUTH AT BREAKFAST AND AT BEDTIME 60 capsule 3   hydrochlorothiazide (HYDRODIURIL) 25 MG tablet TAKE ONE TABLET BY MOUTH ONCE DAILY 90 tablet 2   losartan (COZAAR) 100 MG tablet TAKE ONE TABLET BY MOUTH EVERY MORNING 90 tablet 2   metoprolol succinate (TOPROL-XL) 100 MG 24 hr tablet TAKE ONE TABLET BY MOUTH AT NOON 90 tablet 2   montelukast (SINGULAIR) 10 MG tablet TAKE ONE TABLET BY MOUTH EVERYDAY AT BEDTIME 30 tablet 3   MYRBETRIQ 50 MG TB24 tablet Take 50 mg by mouth daily.     nitroGLYCERIN (NITROSTAT) 0.4 MG SL tablet Place 1 tablet (0.4 mg total) under the tongue every 5 (five) minutes as needed for chest pain. 25  tablet 2   ondansetron (ZOFRAN-ODT) 4 MG disintegrating tablet 4mg  ODT q4 hours prn nausea/vomit 20 tablet 0   pantoprazole (PROTONIX) 40 MG tablet TAKE ONE TABLET BY MOUTH ONCE DAILY 90 tablet 3   rosuvastatin (CRESTOR) 20 MG tablet Take 1 tablet (20 mg total) by mouth at bedtime. 90 tablet 3   sertraline (ZOLOFT) 50 MG tablet Take 1 tablet (50 mg total) by mouth daily. 90 tablet 1   traMADol (ULTRAM) 50 MG tablet Take 1 tablet (50 mg total) by mouth every 6 (six) hours as needed. 20 tablet 0   No current facility-administered medications on file prior to visit.    Past Medical History:  Diagnosis Date   Allergy    Anxiety    C. difficile diarrhea    history of   CAD (coronary artery disease), native coronary artery    a. 05/2017 showed 100% mLCx tx with DES, otherwise 20% mRCA, 50% ramus, 30% dLAD, 20% prox-mid LAD, 20% mLAD treated medically.    Cancer [facility])    Right kidney CA removed.    CKD (chronic kidney disease) stage 3, GFR 30-59 ml/min (HCC) 10/11/2016   s/p R nephrectomy   Colon polyps 2008   HYPERPLASTIC   COPD (chronic obstructive pulmonary disease) (HCC)    former smoker   Gastritis    GERD (gastroesophageal reflux disease)  History of echocardiogram    Echo 3/18:  Moderate LVH, EF 60-65, grade 1 diastolic dysfunction, calcified aortic valve, mild MR, moderate LAE   History of nuclear stress test    Myoview 3/18: Mod size and intensity fixed septal defect, may be artifact.Opposite mod size and intensity lat defect, which is reversible and could represent ischemia or possibly artifact (SDS 4). LVEF 71% with normal wall motion. Intermediate risk study. >> images reviewed with Dr. Dietrich Pates - no sig ischemia; med rx    Hx of cardiovascular stress test    Lexiscan Myoview 6/16:  EF 70%, no scar or ischemia; Low Risk   Hyperlipidemia    Hypertension    Neuropathy    Orthostatic hypotension 05/28/2017   OSA (obstructive sleep apnea) 01/16/2021   Osteoarthritis     Pre-diabetes    no meds   Rheumatoid arthritis (HCC)    RA (Dr. Dareen Piano) Bilateral hands   S/P angioplasty with stent 05/27/17 to LCX with DES  05/28/2017   Sleep apnea 2023   does not use CPAP   Thyroid disease    Allergies  Allergen Reactions   Oxycodone-Acetaminophen Hives and Itching    hallucinations   Percocet [Oxycodone-Acetaminophen] Hives, Itching and Other (See Comments)    hallucinations   Pravastatin Sodium Other (See Comments)    Muscle aches Muscle aches   Hydrocodone Other (See Comments)    "crazy dreams"   Aspirin Other (See Comments)    GI upset Other reaction(s): Other (See Comments) REACTION: GI upset    Social History   Socioeconomic History   Marital status: Divorced    Spouse name: Not on file   Number of children: 3   Years of education: 12   Highest education level: Not on file  Occupational History   Occupation: Retired    Associate Professor: RETIRED  Tobacco Use   Smoking status: Former    Packs/day: 0.20    Years: 15.00    Additional pack years: 0.00    Total pack years: 3.00    Types: Cigarettes    Start date: 10    Quit date: 09/07/2021    Years since quitting: 1.1   Smokeless tobacco: Never   Tobacco comments:    pt has been an on and off smoker since age 55  Vaping Use   Vaping Use: Never used  Substance and Sexual Activity   Alcohol use: No    Alcohol/week: 0.0 standard drinks of alcohol   Drug use: No   Sexual activity: Not Currently    Birth control/protection: Surgical    Comment: hyst  Other Topics Concern   Not on file  Social History Narrative   Single, dgtr lives with her   Current on/off smoker   Alcohol use- no   Drug use-no   Regular Exercise-yes   Social Determinants of Health   Financial Resource Strain: Low Risk  (09/08/2022)   Overall Financial Resource Strain (CARDIA)    Difficulty of Paying Living Expenses: Not hard at all  Food Insecurity: No Food Insecurity (10/20/2022)   Hunger Vital Sign    Worried  About Running Out of Food in the Last Year: Never true    Ran Out of Food in the Last Year: Never true  Transportation Needs: No Transportation Needs (09/08/2022)   PRAPARE - Administrator, Civil Service (Medical): No    Lack of Transportation (Non-Medical): No  Physical Activity: Inactive (09/08/2022)   Exercise Vital Sign    Days  of Exercise per Week: 0 days    Minutes of Exercise per Session: 0 min  Stress: Stress Concern Present (09/08/2022)   [place]    Feeling of Stress : To some extent  Social Connections: Moderately Integrated (09/08/2022)   Social Connection and Isolation Panel [NHANES]    Frequency of Communication with Friends and Family: More than three times a week    Frequency of Social Gatherings with Friends and Family: More than three times a week    Attends Religious Services: More than 4 times per year    Active Member of [employer] or Organizations: Yes    Attends Music therapist: More than 4 times per year    Marital Status: Divorced    There were no vitals filed for this visit. There is no height or weight on file to calculate BMI.  Physical Exam  ASSESSMENT AND PLAN: There are no diagnoses linked to this encounter.  No follow-ups on file.  Rachel Rison G. [name], MD  [facility]. [place] office.  Discharge Instructions   None

## 2022-11-17 ENCOUNTER — Other Ambulatory Visit: Payer: Self-pay | Admitting: Family Medicine

## 2022-11-17 ENCOUNTER — Telehealth: Payer: Self-pay

## 2022-11-17 ENCOUNTER — Encounter: Payer: Self-pay | Admitting: Family Medicine

## 2022-11-17 ENCOUNTER — Ambulatory Visit (INDEPENDENT_AMBULATORY_CARE_PROVIDER_SITE_OTHER): Payer: Medicare HMO | Admitting: Family Medicine

## 2022-11-17 VITALS — BP 120/80 | HR 79 | Temp 98.2°F | Resp 16 | Ht 66.0 in | Wt 226.1 lb

## 2022-11-17 DIAGNOSIS — G629 Polyneuropathy, unspecified: Secondary | ICD-10-CM | POA: Diagnosis not present

## 2022-11-17 DIAGNOSIS — L282 Other prurigo: Secondary | ICD-10-CM | POA: Diagnosis not present

## 2022-11-17 DIAGNOSIS — G4733 Obstructive sleep apnea (adult) (pediatric): Secondary | ICD-10-CM | POA: Diagnosis not present

## 2022-11-17 DIAGNOSIS — F419 Anxiety disorder, unspecified: Secondary | ICD-10-CM | POA: Diagnosis not present

## 2022-11-17 DIAGNOSIS — R69 Illness, unspecified: Secondary | ICD-10-CM | POA: Diagnosis not present

## 2022-11-17 MED ORDER — TRIAMCINOLONE ACETONIDE 0.1 % EX CREA
1.0000 | TOPICAL_CREAM | Freq: Two times a day (BID) | CUTANEOUS | 1 refills | Status: DC | PRN
Start: 2022-11-17 — End: 2024-01-24

## 2022-11-17 NOTE — Patient Instructions (Signed)
A few things to remember from today's visit:  Pruritic rash - Plan: triamcinolone cream (KENALOG) 0.1 % Triamcinolone cream, small amount on affected area 2 times daily for up to 2 weeks at the time. Stop eating tomatoes ands monitor for changes. If you want a referral to allergy specialist please let me know.  If you need refills for medications you take chronically, please call your pharmacy. Do not use My Chart to request refills or for acute issues that need immediate attention. If you send a my chart message, it may take a few days to be addressed, specially if I am not in the office.  Please be sure medication list is accurate. If a new problem present, please set up appointment sooner than planned today.

## 2022-11-17 NOTE — Progress Notes (Signed)
Care Management & Coordination Services Pharmacy Team  Reason for Encounter: Medication coordination and delivery  Contacted patient to discuss medications and coordinate delivery from [facility]. Spoke with patient on 11/17/2022   Cycle dispensing form sent to [facility] for review.   Last adherence delivery date: Not noted in chart      Patient is due for next adherence delivery on: 11/30/2022  This delivery to include: Adherence Packaging  30 Days  Repatha 140 mg/ml - inject 140 mg every 14 days Pantoprazole 40 mg - 1 tablet at breakfast Sertraline 50 mg - take one tablet at lunch Famotidine 20 mg - take one tablet at breakfast and one tablet at bedtime Rosuvastatin 20 mg - take one tablet at bedtime Metoprolol Suc 100 mg - take one tablet at lunch Losartan 100 mg - take one tablet at breakfast HCTZ 25 mg - take one tablet at breakfast Myrbetriq 25 mg - take one tablet at breakfast (new dose every other day) Gabapentin 300 mg - 1 tablet at bedtime (dose decrease from 2 daily)  Patient declined the following medications this month: None  Confirmed delivery date of 11/30/2022, advised patient that pharmacy will contact them the morning of delivery.  Any concerns about your medications? Patient denies  How often do you forget or accidentally miss a dose? Patient denies  Is patient in packaging Yes  If yes  What is the date on your next pill pack? 11/17/2022  Any concerns or issues with your packaging? Patient denies  Recent blood pressure readings are as follows: At visit today 11/17/2022 120/80.   Chart review: Recent office visits:  11/17/2022 Rose Swaziland MD - Patient was seen for pruritic rash and additional concerns. Started Triamcinolone 0.1% topical  Recent consult visits:  10/28/2022 Hilbert Corrigan PsyD - Patient was seen for Major depressive disorder, recurrent episode, moderate. No medication changes.   10/21/2022 Hilbert Corrigan PsyD - Patient  was seen for Major depressive disorder, recurrent episode, moderate. No medication changes.  Hospital visits:  None   Medications: Outpatient Encounter Medications as of 11/17/2022  Medication Sig   acetaminophen (TYLENOL) 500 MG tablet Take 1,000 mg by mouth as needed for moderate pain or headache.    albuterol (VENTOLIN HFA) 108 (90 Base) MCG/ACT inhaler INHALE ONE OR TWO PUFFS BY MOUTH INTO THE LUNGS EVERY SIX HOURS AS NEEDED FOR WHEEZING OR SHORTNESS OF BREATH   aspirin 81 MG chewable tablet Chew 81 mg by mouth daily.   Budeson-Glycopyrrol-Formoterol (BREZTRI AEROSPHERE) 160-9-4.8 MCG/ACT AERO Inhale 2 puffs into the lungs in the morning and at bedtime.   COVID-19 mRNA vaccine 2023-2024 (COMIRNATY) syringe Inject into the muscle.   Evolocumab (REPATHA SURECLICK) 140 MG/ML SOAJ Inject 140 mg into the skin every 14 (fourteen) days.   famotidine (PEPCID) 20 MG tablet TAKE ONE TABLET BY MOUTH EVERY MORNING and TAKE ONE TABLET BY MOUTH EVERYDAY AT BEDTIME   fluticasone (FLONASE) 50 MCG/ACT nasal spray PLACE 2 SPRAYS INTO BOTH NOSTRILS AT BEDTIME AS NEEDED FOR ALLERGIES.   gabapentin (NEURONTIN) 300 MG capsule TAKE ONE CAPSULE BY MOUTH AT BREAKFAST AND AT BEDTIME   hydrochlorothiazide (HYDRODIURIL) 25 MG tablet TAKE ONE TABLET BY MOUTH ONCE DAILY   losartan (COZAAR) 100 MG tablet TAKE ONE TABLET BY MOUTH EVERY MORNING   metoprolol succinate (TOPROL-XL) 100 MG 24 hr tablet TAKE ONE TABLET BY MOUTH AT NOON   montelukast (SINGULAIR) 10 MG tablet TAKE ONE TABLET BY MOUTH EVERYDAY AT BEDTIME   MYRBETRIQ 50 MG TB24  tablet Take 50 mg by mouth daily.   nitroGLYCERIN (NITROSTAT) 0.4 MG SL tablet Place 1 tablet (0.4 mg total) under the tongue every 5 (five) minutes as needed for chest pain.   ondansetron (ZOFRAN-ODT) 4 MG disintegrating tablet  ODT q4 hours prn nausea/vomit   pantoprazole (PROTONIX) 40 MG tablet TAKE ONE TABLET BY MOUTH ONCE DAILY   rosuvastatin (CRESTOR) 20 MG tablet Take 1 tablet  (20 mg total) by mouth at bedtime.   sertraline (ZOLOFT) 50 MG tablet TAKE ONE TABLET BY MOUTH AT NOON   traMADol (ULTRAM) 50 MG tablet Take 1 tablet (50 mg total) by mouth every 6 (six) hours as needed.   triamcinolone cream (KENALOG) 0.1 % Apply 1 Application topically 3 times/day as needed-between meals & bedtime.   No facility-administered encounter medications on file as of 11/17/2022.   BP Readings from Last 3 Encounters:  11/17/22 120/80  10/19/22 126/72  10/12/22 128/80    Pulse Readings from Last 3 Encounters:  11/17/22 79  10/19/22 79  10/12/22 82    Lab Results  Component Value Date/Time   HGBA1C 6.1 (A) 09/27/2022 01:03 PM   HGBA1C 6.3 03/24/2022 03:14 PM   HGBA1C 5.9 04/17/2021 12:47 PM   Lab Results  Component Value Date   CREATININE 1.65 (H) 07/05/2022   BUN 19 07/05/2022   GFR 29.60 (L) 07/05/2022   GFRNONAA 34 (L) 06/14/2022   GFRAA 40 (L) 07/07/2020   NA 139 07/05/2022   K 3.6 07/05/2022   CALCIUM 9.6 07/05/2022   CO2 29 07/05/2022   Inetta Fermo CMA  Clinical Pharmacist Assistant [phone]

## 2022-11-19 ENCOUNTER — Encounter (HOSPITAL_COMMUNITY): Payer: Self-pay

## 2022-11-19 ENCOUNTER — Ambulatory Visit (HOSPITAL_COMMUNITY)
Admission: EM | Admit: 2022-11-19 | Discharge: 2022-11-19 | Disposition: A | Discharge status: Home / Self Care | Payer: Medicare HMO | Attending: Emergency Medicine | Admitting: Emergency Medicine

## 2022-11-19 DIAGNOSIS — R21 Rash and other nonspecific skin eruption: Secondary | ICD-10-CM | POA: Diagnosis not present

## 2022-11-19 MED ORDER — METHYLPREDNISOLONE SODIUM SUCC 125 MG IJ SOLR
60.0000 mg | Freq: Once | INTRAMUSCULAR | Status: AC
  Administered 2022-11-19: 60 mg via INTRAMUSCULAR

## 2022-11-19 MED ORDER — METHYLPREDNISOLONE SODIUM SUCC 125 MG IJ SOLR
INTRAMUSCULAR | Status: AC
  Filled 2022-11-19: qty 2

## 2022-11-19 MED ORDER — CETIRIZINE HCL 5 MG PO TABS: 5.0000 mg | ORAL_TABLET | Freq: Every day | ORAL | 0 refills | Status: DC

## 2022-11-19 NOTE — Discharge Instructions (Addendum)
We have given you a dose of IM steroids today in clinic to help with your itching and your rash.  Please start on a daily antihistamine to help decrease your overall histamine response.  He can also take Benadryl at 25 mg every 6 hours as needed for itching.  Please follow-up with your primary care provider on Monday or early next week if no improvement for potential dermatology referral.

## 2022-11-19 NOTE — ED Triage Notes (Signed)
Pt present to the office for rash on left arm. Pt stated the rash has been there for 2 weeks. Pt reports she stop eating tomato.

## 2022-11-19 NOTE — ED Provider Notes (Signed)
[facility]    CSN: [id number] Arrival date & time: 11/19/22  1854      History   Chief Complaint Chief Complaint  Patient presents with   Rash    HPI [name] is a 77 y.o. female.   Patient presents to clinic for complaints of bilateral arm itching, hives and skin discoloration over healed areas.  She also reports that her right foot has been itching as well. She was seen on 11/17/22 at her PCP and given triamcinolone cream. This has helped some, but now her rash is worse.   She has not changed washing powder. She uses a non-scented Cerave lotion.  She attempted to cut out tomatoes or anything tomato-based to see if this helped, it did not.   She denies rash spreading, SOB, chest pain, oral swelling or wheezing.     The history is provided by the patient.  Rash Associated symptoms: no abdominal pain, no fatigue, no fever and no shortness of breath     Past Medical History:  Diagnosis Date   Allergy    Anxiety    C. difficile diarrhea    history of   CAD (coronary artery disease), native coronary artery    a. 05/2017 showed 100% mLCx tx with DES, otherwise 20% mRCA, 50% ramus, 30% dLAD, 20% prox-mid LAD, 20% mLAD treated medically.    Cancer    Right kidney CA removed.    CKD (chronic kidney disease) stage 3, GFR 30-59 ml/min 10/11/2016   s/p R nephrectomy   Colon polyps 2008   HYPERPLASTIC   COPD (chronic obstructive pulmonary disease)    former smoker   Gastritis    GERD (gastroesophageal reflux disease)    History of echocardiogram    Echo 3/18:  Moderate LVH, EF 60-65, grade 1 diastolic dysfunction, calcified aortic valve, mild MR, moderate LAE   History of nuclear stress test    Myoview 3/18: Mod size and intensity fixed septal defect, may be artifact.Opposite mod size and intensity lat defect, which is reversible and could represent ischemia or possibly artifact (SDS 4). LVEF 71% with normal wall motion. Intermediate risk study. >> images  reviewed with Dr. Dietrich Pates - no sig ischemia; med rx    Hx of cardiovascular stress test    Lexiscan Myoview 6/16:  EF 70%, no scar or ischemia; Low Risk   Hyperlipidemia    Hypertension    Neuropathy    Orthostatic hypotension 05/28/2017   OSA (obstructive sleep apnea) 01/16/2021   Osteoarthritis    Pre-diabetes    no meds   Rheumatoid arthritis    RA (Dr. Dareen Piano) Bilateral hands   S/P angioplasty with stent 05/27/17 to LCX with DES  05/28/2017   Sleep apnea 2023   does not use CPAP   Thyroid disease     Patient Active Problem List   Diagnosis Date Noted   Local reaction to COVID-19 vaccine 10/12/2022   Moderate episode of recurrent major depressive disorder 03/24/2022   PAD (peripheral artery disease) 11/28/2021   OSA (obstructive sleep apnea) 01/16/2021   COPD mixed type 01/16/2021   Fatigue 11/03/2020   B12 deficiency 11/03/2020   Chest pain 08/30/2020   Malignant neoplasm of kidney excluding renal pelvis, unspecified laterality 09/26/2019   Vitamin D deficiency, unspecified 09/26/2019   Anxiety disorder, unspecified 03/26/2019   Tobacco use disorder 09/29/2018   Cervical myelopathy 05/29/2018   ANA positive 12/08/2017   Cervicalgia 09/16/2017   Carpal tunnel syndrome of left wrist  07/21/2017   Chondrocalcinosis 07/21/2017   Neuropathy 07/21/2017   Orthostatic hypotension 05/28/2017   S/P angioplasty with stent 05/27/17 to LCX with DES  05/28/2017   CAD (coronary artery disease)    Polyneuropathy 05/25/2017   Chest pain with moderate risk for cardiac etiology 05/25/2017   Class 1 obesity with serious comorbidity and body mass index (BMI) of 34.0 to 34.9 in adult 12/06/2016   CKD (chronic kidney disease) stage 3, GFR 30-59 ml/min 10/11/2016   Hot flashes, menopausal 10/01/2016   Chest pain with moderate risk of acute coronary syndrome 10/25/2014   Degenerative spondylolisthesis 10/15/2014   Spinal stenosis, lumbar region, with neurogenic claudication 02/12/2013    DM (diabetes mellitus), type 2 with renal complications 03/05/2010   History of Rtr nephrectomy 2010, secondary to renal cell cancer 01/14/2009   PERSONAL HX COLONIC POLYPS 11/14/2008   Hyperlipidemia associated with type 2 diabetes mellitus 11/16/2007   ESOPHAGEAL STRICTURE 10/12/2007   Hypertension with heart disease 01/19/2007   Allergic rhinitis 01/19/2007   GERD 01/19/2007    Past Surgical History:  Procedure Laterality Date   ABDOMINAL HYSTERECTOMY  1978   ANTERIOR CERVICAL DECOMP/DISCECTOMY FUSION N/A 05/29/2018   Procedure: ANTERIOR CERVICAL DECOMPRESSION FUSION - CERVICAL FIVE-CERVICAL SIX - CERVICAL SIX-CERVICAL SEVEN;  Surgeon: Julio Sicks, MD;  Location: [facility];  Service: Neurosurgery;  Laterality: N/A;   BACK SURGERY     x 2   BREAST LUMPECTOMY WITH RADIOACTIVE SEED LOCALIZATION Right 04/13/2022   Procedure: RIGHT BREAST SEED LUMPECTOMY;  Surgeon: Harriette Bouillon, MD;  Location: [facility];  Service: General;  Laterality: Right;   CARPAL TUNNEL RELEASE Left    COLONOSCOPY W/ BIOPSIES AND POLYPECTOMY     Hx: of   CORONARY STENT INTERVENTION N/A 05/27/2017   Procedure: CORONARY STENT INTERVENTION;  Surgeon: Kathleene Hazel, MD;  Location: [facility];  Service: Cardiovascular;  Laterality: N/A;   ESOPHAGOGASTRODUODENOSCOPY     HNP     LEFT HEART CATH AND CORONARY ANGIOGRAPHY N/A 05/27/2017   Procedure: LEFT HEART CATH AND CORONARY ANGIOGRAPHY;  Surgeon: Kathleene Hazel, MD;  Location: [facility];  Service: Cardiovascular;  Laterality: N/A;   LUMBAR LAMINECTOMY/DECOMPRESSION MICRODISCECTOMY Left 02/12/2013   Procedure: LUMBAR TWO THREE, LUMBAR THREE FOUR, LUMBAR FOUR FIVE  LAMINECTOMY/DECOMPRESSION MICRODISCECTOMY 3 LEVELS;  Surgeon: Temple Pacini, MD;  Location: [facility];  Service: Neurosurgery;  Laterality: Left;   NEPHRECTOMY Right 2010   10.rcc cancer   TOTAL KNEE ARTHROPLASTY Right    Redo    OB History   No  obstetric history on file.      Home Medications    Prior to Admission medications   Medication Sig Start Date End Date Taking? Authorizing Provider  aspirin 81 MG chewable tablet Chew 81 mg by mouth daily.   Yes [provider]  Budeson-Glycopyrrol-Formoterol (BREZTRI AEROSPHERE) 160-9-4.8 MCG/ACT AERO Inhale 2 puffs into the lungs in the morning and at bedtime. 07/22/22  Yes Young, Joni Fears D, MD  cetirizine (ZYRTEC) 5 MG tablet Take 1 tablet (5 mg total) by mouth daily. 11/19/22 12/19/22 Yes Avant Printy, [place] N, FNP  Evolocumab (REPATHA SURECLICK) 140 MG/ML SOAJ Inject 140 mg into the skin every 14 (fourteen) days. 10/27/22  Yes Pricilla Riffle, MD  famotidine (PEPCID) 20 MG tablet TAKE ONE TABLET BY MOUTH EVERY MORNING and TAKE ONE TABLET BY MOUTH EVERYDAY AT BEDTIME 06/21/22  Yes [place], [name], MD  fluticasone [facility]) 50 MCG/ACT nasal spray PLACE 2 SPRAYS  INTO BOTH NOSTRILS AT BEDTIME AS NEEDED FOR ALLERGIES. 06/16/22  Yes [place], [name], MD  gabapentin (NEURONTIN) 300 MG capsule TAKE ONE CAPSULE BY MOUTH AT BREAKFAST AND AT BEDTIME 11/03/22  Yes Penumalli, Glenford Bayley, MD  hydrochlorothiazide (HYDRODIURIL) 25 MG tablet TAKE ONE TABLET BY MOUTH ONCE DAILY 10/18/22  Yes [place], [name], MD  losartan (COZAAR) 100 MG tablet TAKE ONE TABLET BY MOUTH EVERY MORNING 10/18/22  Yes [place], [name], MD  metoprolol succinate (TOPROL-XL) 100 MG 24 hr tablet TAKE ONE TABLET BY MOUTH AT NOON 10/18/22  Yes [place], [name], MD  montelukast (SINGULAIR) 10 MG tablet TAKE ONE TABLET BY MOUTH EVERYDAY AT BEDTIME 03/22/22  Yes [place], [name], MD  MYRBETRIQ 50 MG TB24 tablet Take 50 mg by mouth daily. 10/29/21  Yes [provider]  nitroGLYCERIN (NITROSTAT) 0.4 MG SL tablet Place 1 tablet (0.4 mg total) under the tongue every 5 (five) minutes as needed for chest pain. 05/26/21  Yes Pricilla Riffle, MD  ondansetron (ZOFRAN-ODT) 4 MG disintegrating tablet  ODT q4 hours prn nausea/vomit 08/01/21  Yes  Adela Lank, Dan, DO  pantoprazole (PROTONIX) 40 MG tablet TAKE ONE TABLET BY MOUTH ONCE DAILY 10/18/22  Yes Cirigliano, Vito V, DO  rosuvastatin (CRESTOR) 20 MG tablet Take 1 tablet (20 mg total) by mouth at bedtime. 04/20/22  Yes Pricilla Riffle, MD  sertraline (ZOLOFT) 50 MG tablet TAKE ONE TABLET BY MOUTH AT NOON 11/17/22  Yes [place], [name], MD  traMADol (ULTRAM) 50 MG tablet Take 1 tablet (50 mg total) by mouth every 6 (six) hours as needed. 04/13/22 04/13/23 Yes Cornett, Maisie Fus, MD  triamcinolone cream (KENALOG) 0.1 % Apply 1 Application topically 3 times/day as needed-between meals & bedtime. 11/17/22  Yes [place], [name], MD  acetaminophen (TYLENOL) 500 MG tablet Take 1,000 mg by mouth as needed for moderate pain or headache.     [provider]  albuterol (VENTOLIN HFA) 108 (90 Base) MCG/ACT inhaler INHALE ONE OR TWO PUFFS BY MOUTH INTO THE LUNGS EVERY SIX HOURS AS NEEDED FOR WHEEZING OR SHORTNESS OF BREATH 04/20/22   Jetty Duhamel D, MD  COVID-19 mRNA vaccine [phone] (COMIRNATY) syringe Inject into the muscle. 09/29/22       Family History Family History  Problem Relation Age of Onset   Rheum arthritis Mother    Stroke Mother    Prostate cancer Father    Heart disease Father    Diabetes Brother    Dementia Brother    Parkinsonism Brother    Kidney disease Son    Diabetes Brother    Cancer Brother    Dementia Brother    Colon cancer Neg Hx    Esophageal cancer Neg Hx    Pancreatic cancer Neg Hx    Liver disease Neg Hx    Rectal cancer Neg Hx    Stomach cancer Neg Hx     Social History Social History   Tobacco Use   Smoking status: Former    Packs/day: 0.20    Years: 15.00    Additional pack years: 0.00    Total pack years: 3.00    Types: Cigarettes    Start date: 47    Quit date: 09/07/2021    Years since quitting: 1.2   Smokeless tobacco: Never   Tobacco comments:    pt has been an on and off smoker since age 14  Vaping Use   Vaping Use: Never used   Substance Use Topics  Alcohol use: No    Alcohol/week: 0.0 standard drinks of alcohol   Drug use: No     Allergies   Oxycodone-acetaminophen, Percocet [oxycodone-acetaminophen], Pravastatin sodium, Hydrocodone, and Aspirin   Review of Systems Review of Systems  Constitutional:  Negative for chills, fatigue and fever.  Respiratory:  Negative for cough and shortness of breath.   Cardiovascular:  Negative for chest pain.  Gastrointestinal:  Negative for abdominal pain.  Skin:  Positive for rash.     Physical Exam Triage Vital Signs ED Triage Vitals [11/19/22 1919]  Enc Vitals Group     BP 113/72     Pulse Rate 62     Resp 16     Temp 97.8 F (36.6 C)     Temp Source Oral     SpO2 93 %     Weight      Height      Head Circumference      Peak Flow      Pain Score      Pain Loc      Pain Edu?      Excl. in GC?    No data found.  Updated Vital Signs BP (!) 157/96 (BP Location: Left Arm)   Pulse 96   Temp 98.6 F (37 C) (Oral)   Resp 18   SpO2 100%   Visual Acuity Right Eye Distance:   Left Eye Distance:   Bilateral Distance:    Right Eye Near:   Left Eye Near:    Bilateral Near:     Physical Exam Vitals and nursing note reviewed.  Constitutional:      Appearance: Normal appearance.  HENT:     Head: Normocephalic and atraumatic.     Right Ear: External ear normal.     Left Ear: External ear normal.     Nose: Nose normal.     Mouth/Throat:     Mouth: Mucous membranes are moist.  Eyes:     General: No scleral icterus.       Right eye: No discharge.        Left eye: No discharge.     Conjunctiva/sclera: Conjunctivae normal.  Cardiovascular:     Rate and Rhythm: Normal rate and regular rhythm.  Pulmonary:     Effort: Pulmonary effort is normal. No respiratory distress.  Skin:    General: Skin is warm and dry.     Findings: Erythema and rash present. Rash is urticarial.     Comments: Erythematous urticarial rash to bilateral upper arms and  distal right foot.   Neurological:     Mental Status: She is alert and oriented to person, place, and time.  Psychiatric:        Mood and Affect: Mood normal.        Behavior: Behavior normal. Behavior is cooperative.      UC Treatments / Results  Labs (all labs ordered are listed, but only abnormal results are displayed) Labs Reviewed - No data to display  EKG   Radiology No results found.  Procedures Procedures (including critical care time)  Medications Ordered in UC Medications  methylPREDNISolone sodium succinate (SOLU-MEDROL) 125 mg/2 mL injection 60 mg (60 mg Intramuscular Given 11/19/22 1953)    Initial Impression / Assessment and Plan / UC Course  I have reviewed the triage vital signs and the nursing notes.  Pertinent labs & imaging results that were available during my care of the patient were reviewed by me and considered in my medical  decision making (see chart for details).  Vitals and triage reviewed, patient is hemodynamically stable.  Urticarial rash to posterior upper arms and distal right foot.  Rash seems to be worsening despite over-the-counter steroid ointment.  Given a dose of IM steroids in clinic and started on an antihistamine.  Encouraged to follow-up with primary care provider early next week if no improvement, for potential dermatology referral and or labs.  Patient verbalized understanding, no questions at this time.     Final Clinical Impressions(s) / UC Diagnoses   Final diagnoses:  Rash and nonspecific skin eruption     Discharge Instructions      We have given you a dose of IM steroids today in clinic to help with your itching and your rash.  Please start on a daily antihistamine to help decrease your overall histamine response.  He can also take Benadryl at 25 mg every 6 hours as needed for itching.  Please follow-up with your primary care provider on Monday or early next week if no improvement for potential dermatology referral.        ED Prescriptions     Medication Sig Dispense Auth. Provider   cetirizine (ZYRTEC) 5 MG tablet Take 1 tablet (5 mg total) by mouth daily. 30 tablet Evi Mccomb, [place] N, [place]      PDMP not reviewed this encounter.   Gita Dilger, [place] N, [place] 11/19/22 [phone]

## 2022-11-25 ENCOUNTER — Encounter: Payer: Self-pay | Admitting: Internal Medicine

## 2022-11-25 NOTE — Assessment & Plan Note (Signed)
Satisfactory compliance and control. Plan-continue auto 4-10

## 2022-11-25 NOTE — Assessment & Plan Note (Signed)
No recent exacerbation.  Breztri working well but she depends on [employer] assistance Plan-continue [employer]

## 2022-12-02 ENCOUNTER — Ambulatory Visit (INDEPENDENT_AMBULATORY_CARE_PROVIDER_SITE_OTHER): Payer: Medicare HMO | Admitting: Psychology

## 2022-12-02 ENCOUNTER — Encounter: Payer: Self-pay | Admitting: Psychology

## 2022-12-02 DIAGNOSIS — F331 Major depressive disorder, recurrent, moderate: Secondary | ICD-10-CM

## 2022-12-02 NOTE — Progress Notes (Signed)
Behavioral Health Counselor Initial Adult Exam  Name: [name] Date: 12/02/2022 MRN: [id number] DOB: January 18, 1946 PCP: [place], [name], MD  Time spent: 9:56am-10:00an Pt was seen for an in person visit.  Guardian/Payee:  NA    Paperwork requested: No   Reason for Visit /Presenting Problem: Pt had been seeing Dr. Bosie Clos for MDD since 04/2022.  She is transferred from his care as he recently left the practice.  Pt reports it has been beneficial to have counseling and would like to continue.   Mental Status Exam: Appearance:   Well Groomed     Behavior:  Appropriate  Motor:  Normal  Speech/Language:   Clear and Coherent  Affect:  Appropriate  Mood:  depressed  Thought process:  normal  Thought content:    WNL  Sensory/Perceptual disturbances:    WNL  Orientation:  oriented to person, place, and time/date  Attention:  Good  Concentration:  Good  Memory:  WNL  Fund of knowledge:   Good  Insight:    Good  Judgment:   Good  Impulse Control:  Good     Reported Symptoms:  Pt reports feeling down.  Pt reports days were she is tearful and doesn't no why.  Pt reports some day difficulty getting up/out of room and days that doesn't want to go anywhere.  Pt reports more withdrawn- days that doesn't want to talk to anyone.  Pt reports lack of motivation and struggling w/ follow through on things she wants to do.  Pt reports more loneliness w/ losses/deaths, less family visiting. She denied suicidal and homicidal ideation.   Risk Assessment: Danger to Self:  No Self-injurious Behavior: No Danger to Others: No Duty to Warn:no Physical Aggression / Violence:No  Access to Firearms a concern: No  Gang Involvement:No  Patient / guardian was educated about steps to take if suicide or homicide risk level increases between visits: n/a While future psychiatric events cannot be accurately predicted, the patient does not currently require acute inpatient psychiatric care and does  not currently meet [facility] involuntary commitment criteria.  Substance Abuse History: Current substance abuse: No     Past Psychiatric History:   Previous psychological history is significant for depression Outpatient Providers: Dr. Estrella Deeds for counseling about biweekly since 04/2022.   History of Psych Hospitalization: No  Psychological Testing:  NA    Abuse History:  Victim of: No.,  NA    Report needed: No. Victim of Neglect:No. Perpetrator of  NA   Witness / Exposure to Domestic Violence: No   Protective Services Involvement: No  Witness to [employer] Violence:  No   Family History:  Family History  Problem Relation Age of Onset   Rheum arthritis Mother    Stroke Mother    Prostate cancer Father    Heart disease Father    Diabetes Brother    Dementia Brother    Parkinsonism Brother    Diabetes Brother    Cancer Brother    Dementia Brother    Kidney disease Son    Bipolar disorder Daughter    Colon cancer Neg Hx    Esophageal cancer Neg Hx    Pancreatic cancer Neg Hx    Liver disease Neg Hx    Rectal cancer Neg Hx    Stomach cancer Neg Hx   Pt grew up in [place] area w/ mom and dad.  Pt was one of 7 kids.  Pt reports only her and her sister left.  Pt reports that 3 of her brothers died in 2 years from 2020-2022.  Pt reports that she was very close to her youngest brother who was 7 years older than her and he died in 12-10-2020.    Living situation: the patient lives in her home and her daughter lives w/ her.    Sexual Orientation: Straight  Relationship Status: divorced.  Pt reports her husband left when her daughter- the youngest- was 45 years old Name of spouse / other:NA If a parent, number of children / ages:Patient has two sons and one daughter. She has two grandchildren by her oldest son- they live in [place].  Support Systems: Pt reports her daughter is her support.    Financial Stress:  No   Income/Employment/Disability: Product manager.     Military Service: No   Educational History: Education: high school diploma/GED  Religion/Sprituality/World View: Christian/Baptist   Pt reports she was very active in church in the past.  Pt reports she has many close friends but has been struggling w/ the paster and doesn't agree w/ some things he says.  Pt reports that she hasn't attended in person this year- she will still watch online.   Any cultural differences that may affect / interfere with treatment:  not applicable   Recreation/Hobbies: Being with daughter. Attending events at church.   Stressors: Loss of family members due to death and decreased visits from nieces as they have grown.     Other:  Concern for her son's health    Strengths: Supportive Relationships  Barriers:  NA   Legal History: Pending legal issue / charges: The patient has no significant history of legal issues. History of legal issue / charges:  NA  Medical History/Surgical History: reviewed Past Medical History:  Diagnosis Date   Allergy    Anxiety    C. difficile diarrhea    history of   CAD (coronary artery disease), native coronary artery    a. 05/2017 showed 100% mLCx tx with DES, otherwise 20% mRCA, 50% ramus, 30% dLAD, 20% prox-mid LAD, 20% mLAD treated medically.    Cancer [facility])    Right kidney CA removed.    CKD (chronic kidney disease) stage 3, GFR 30-59 ml/min (HCC) 10/11/2016   s/p R nephrectomy   Colon polyps 2006/12/11   HYPERPLASTIC   COPD (chronic obstructive pulmonary disease) (HCC)    former smoker   Gastritis    GERD (gastroesophageal reflux disease)    History of echocardiogram    Echo 3/18:  Moderate LVH, EF 60-65, grade 1 diastolic dysfunction, calcified aortic valve, mild MR, moderate LAE   History of nuclear stress test    Myoview 3/18: Mod size and intensity fixed septal defect, may be artifact.Opposite mod size and intensity lat defect, which is reversible and could represent ischemia or possibly artifact (SDS 4). LVEF  71% with normal wall motion. Intermediate risk study. >> images reviewed with Dr. Dietrich Pates - no sig ischemia; med rx    Hx of cardiovascular stress test    Lexiscan Myoview 6/16:  EF 70%, no scar or ischemia; Low Risk   Hyperlipidemia    Hypertension    Neuropathy    Orthostatic hypotension 05/28/2017   OSA (obstructive sleep apnea) 01/16/2021   Osteoarthritis    Pre-diabetes    no meds   Rheumatoid arthritis (HCC)    RA (Dr. Dareen Piano) Bilateral hands   S/P angioplasty with stent 05/27/17 to LCX with DES  05/28/2017   Sleep apnea 12-10-21  does not use CPAP   Thyroid disease     Past Surgical History:  Procedure Laterality Date   ABDOMINAL HYSTERECTOMY  1978   ANTERIOR CERVICAL DECOMP/DISCECTOMY FUSION N/A 05/29/2018   Procedure: ANTERIOR CERVICAL DECOMPRESSION FUSION - CERVICAL FIVE-CERVICAL SIX - CERVICAL SIX-CERVICAL SEVEN;  Surgeon: Julio Sicks, MD;  Location: [facility] OR;  Service: Neurosurgery;  Laterality: N/A;   BACK SURGERY     x 2   BREAST LUMPECTOMY WITH RADIOACTIVE SEED LOCALIZATION Right 04/13/2022   Procedure: RIGHT BREAST SEED LUMPECTOMY;  Surgeon: Harriette Bouillon, MD;  Location:  SURGERY CENTER;  Service: General;  Laterality: Right;   CARPAL TUNNEL RELEASE Left    COLONOSCOPY W/ BIOPSIES AND POLYPECTOMY     Hx: of   CORONARY STENT INTERVENTION N/A 05/27/2017   Procedure: CORONARY STENT INTERVENTION;  Surgeon: Kathleene Hazel, MD;  Location: [facility] INVASIVE CV LAB;  Service: Cardiovascular;  Laterality: N/A;   ESOPHAGOGASTRODUODENOSCOPY     HNP     LEFT HEART CATH AND CORONARY ANGIOGRAPHY N/A 05/27/2017   Procedure: LEFT HEART CATH AND CORONARY ANGIOGRAPHY;  Surgeon: Kathleene Hazel, MD;  Location: [facility] INVASIVE CV LAB;  Service: Cardiovascular;  Laterality: N/A;   LUMBAR LAMINECTOMY/DECOMPRESSION MICRODISCECTOMY Left 02/12/2013   Procedure: LUMBAR TWO THREE, LUMBAR THREE FOUR, LUMBAR FOUR FIVE  LAMINECTOMY/DECOMPRESSION MICRODISCECTOMY 3 LEVELS;   Surgeon: Temple Pacini, MD;  Location: [facility] NEURO ORS;  Service: Neurosurgery;  Laterality: Left;   NEPHRECTOMY Right 2010   10.rcc cancer   TOTAL KNEE ARTHROPLASTY Right    Redo    Medications: Current Outpatient Medications  Medication Sig Dispense Refill   acetaminophen (TYLENOL) 500 MG tablet Take 1,000 mg by mouth as needed for moderate pain or headache.      albuterol (VENTOLIN HFA) 108 (90 Base) MCG/ACT inhaler INHALE ONE OR TWO PUFFS BY MOUTH INTO THE LUNGS EVERY SIX HOURS AS NEEDED FOR WHEEZING OR SHORTNESS OF BREATH 8.5 g 12   aspirin 81 MG chewable tablet Chew 81 mg by mouth daily.     Budeson-Glycopyrrol-Formoterol (BREZTRI AEROSPHERE) 160-9-4.8 MCG/ACT AERO Inhale 2 puffs into the lungs in the morning and at bedtime. 2 each 11   cetirizine (ZYRTEC) 5 MG tablet Take 1 tablet (5 mg total) by mouth daily. 30 tablet 0   COVID-19 mRNA vaccine 2023-2024 (COMIRNATY) syringe Inject into the muscle. 0.3 mL 0   Evolocumab (REPATHA SURECLICK) 140 MG/ML SOAJ Inject 140 mg into the skin every 14 (fourteen) days. 2 mL 11   famotidine (PEPCID) 20 MG tablet TAKE ONE TABLET BY MOUTH EVERY MORNING and TAKE ONE TABLET BY MOUTH EVERYDAY AT BEDTIME 180 tablet 2   fluticasone (FLONASE) 50 MCG/ACT nasal spray PLACE 2 SPRAYS INTO BOTH NOSTRILS AT BEDTIME AS NEEDED FOR ALLERGIES. 48 mL 1   gabapentin (NEURONTIN) 300 MG capsule TAKE ONE CAPSULE BY MOUTH AT BREAKFAST AND AT BEDTIME 60 capsule 3   hydrochlorothiazide (HYDRODIURIL) 25 MG tablet TAKE ONE TABLET BY MOUTH ONCE DAILY 90 tablet 2   losartan (COZAAR) 100 MG tablet TAKE ONE TABLET BY MOUTH EVERY MORNING 90 tablet 2   metoprolol succinate (TOPROL-XL) 100 MG 24 hr tablet TAKE ONE TABLET BY MOUTH AT NOON 90 tablet 2   montelukast (SINGULAIR) 10 MG tablet TAKE ONE TABLET BY MOUTH EVERYDAY AT BEDTIME 30 tablet 3   MYRBETRIQ 50 MG TB24 tablet Take 50 mg by mouth daily.     nitroGLYCERIN (NITROSTAT) 0.4 MG SL tablet Place 1 tablet (0.4 mg total) under the  tongue every 5 (five) minutes as needed for chest pain. 25 tablet 2   ondansetron (ZOFRAN-ODT) 4 MG disintegrating tablet 4mg  ODT q4 hours prn nausea/vomit 20 tablet 0   pantoprazole (PROTONIX) 40 MG tablet TAKE ONE TABLET BY MOUTH ONCE DAILY 90 tablet 3   rosuvastatin (CRESTOR) 20 MG tablet Take 1 tablet (20 mg total) by mouth at bedtime. 90 tablet 3   sertraline (ZOLOFT) 50 MG tablet TAKE ONE TABLET BY MOUTH AT NOON 90 tablet 2   traMADol (ULTRAM) 50 MG tablet Take 1 tablet (50 mg total) by mouth every 6 (six) hours as needed. 20 tablet 0   triamcinolone cream (KENALOG) 0.1 % Apply 1 Application topically 3 times/day as needed-between meals & bedtime. 30 g 1   No current facility-administered medications for this visit.    Allergies  Allergen Reactions   Oxycodone-Acetaminophen Hives and Itching    hallucinations   Percocet [Oxycodone-Acetaminophen] Hives, Itching and Other (See Comments)    hallucinations   Pravastatin Sodium Other (See Comments)    Muscle aches Muscle aches   Hydrocodone Other (See Comments)    "crazy dreams"   Aspirin Other (See Comments)    GI upset Other reaction(s): Other (See Comments) REACTION: GI upset    Diagnoses:  Major depressive disorder, recurrent episode, moderate    Plan of Care: The patient is a 77 year old Black female who was referred by recent therapist who left the practice.  Pt has been dx w/ MDD and has been attending counseling about biweekly.  Pt daughter who lives w/ her is supportive.  Pt continues to express feeling down, sad, tearful, lack of motivation, social isolation, and avoiding pleasurable activities. She denied suicidal and homicidal ideation. The pt participated in tx plan development and will f/u w/ counseling in 2 weeks.  Pt will continue w/ PCP for medication management.   Individualized Treatment Plan Strengths: enjoys time w/ daughter, son and w/ extended family and church community  Supports: her daughter    Goal/Needs for Treatment:  In order of importance to patient 1) engaged in activities/ talk w/ friends 2) decreased depressed moods 3) ---   Client Statement of Needs: "Get out of this mood I'm in. "   Treatment Level:out pt counseling  Symptoms:depressed mood, tearfulness, loss of interest, difficulty w/ motivation, withdrawn from interacting.   Client Treatment Preferences:biweekly in person counseling.  Continue medication management w/ PCP.    Healthcare consumer's goal for treatment:  Counselor, Forde Radon, [facility] will support the patient's ability to achieve the goals identified. Cognitive Behavioral Therapy, Assertive Communication/Conflict Resolution Training, Relaxation Training, ACT, Humanistic and other evidenced-based practices will be used to promote progress towards healthy functioning.   Healthcare consumer will: Actively participate in therapy, working towards healthy functioning.    *Justification for Continuation/Discontinuation of Goal: R=Revised, O=Ongoing, A=Achieved, D=Discontinued  Goal 1)  Increased engagement w/ activities she enjoys and w/ social interactions AEB pt report and counselor observation. Baseline date 12/02/22: Progress towards goal 0; How Often - Daily Target Date Goal Was reviewed Status Code Progress towards goal/Likert rating  12/02/23                Goal 2) Increased coping w/ losses and w/ depressed moods AEB utilizing coping skills daily.   Baseline date 12/02/22: Progress towards goal 0; How Often - Daily Target Date Goal Was reviewed Status Code Progress towards goal  12/02/23  This plan has been reviewed and created by the following participants:  This plan will be reviewed at least every 12 months. Date Behavioral Health Clinician Date Guardian/Patient   12/02/22  [facility] Ophelia [facility] 12/02/22 Verbal Consent Provided                      Forde Radon [facility]

## 2022-12-15 ENCOUNTER — Other Ambulatory Visit: Payer: Self-pay | Admitting: Family Medicine

## 2022-12-15 DIAGNOSIS — J3089 Other allergic rhinitis: Secondary | ICD-10-CM

## 2022-12-16 ENCOUNTER — Ambulatory Visit: Payer: Medicare HMO | Admitting: Psychology

## 2022-12-16 DIAGNOSIS — F331 Major depressive disorder, recurrent, moderate: Secondary | ICD-10-CM | POA: Diagnosis not present

## 2022-12-16 NOTE — Progress Notes (Signed)
[facility] Counselor/Therapist Progress Note  Patient ID: JAHZARRA DESHAY, MRN: [id number],    Date: 12/16/2022  Time Spent: 11:00am-11:58am  pt is seen for an in person visit.     Treatment Type: Individual Therapy  Reported Symptoms: difficult w/ fatigue and initiating activities.    Mental Status Exam: Appearance:  Well Groomed     Behavior: Appropriate  Motor: Normal  Speech/Language:  Normal Rate  Affect: Appropriate  Mood: depressed  Thought process: normal  Thought content:   WNL  Sensory/Perceptual disturbances:   WNL  Orientation: oriented to person, place, time/date, and situation  Attention: Good  Concentration: Good  Memory: WNL  Fund of knowledge:  Good  Insight:   Good  Judgment:  Good  Impulse Control: Good   Risk Assessment: Danger to Self:  No Self-injurious Behavior: No Danger to Others: No Duty to Warn:no Physical Aggression / Violence:No  Access to Firearms a concern: No  Gang Involvement:No   Subjective: Counselor assessed pt current functioning per pt report. Processed w/pt struggles w/ fatigue and motivation.  Explored areas that she was able to engage with things and positive outcomes reflected.  Discussed behavioral activations and areas that are barriers.  Explored self statements to encourage and start w/ small consistent steps.  Pt affect wnl.  Pt reports continues to struggle w/ fatigue, wanting to sleep, not go out and not engage in things.  Pt feels bad about not doing these things.  Pt discussed some times that did- picked something up from church, talked on phone w/ friend for an hour, visited son's cat to take care of.  Pt reported that positive feeling when did these things and enjoyed.  Pt recognized that t.v. can be barrier for her and self talk discouraging.  Pt receptive to small steps.     Interventions: Cognitive Behavioral Therapy and behavioral activation  Diagnosis:Major depressive disorder, recurrent episode, moderate  (HCC)  Plan: pt to f/u in 2 weeks for counseling.  Pt to f/u as scheduled w/ PCP.      Individualized Treatment Plan Strengths: enjoys time w/ daughter, son and w/ extended family and church community  Supports: her daughter   Goal/Needs for Treatment:  In order of importance to patient 1) engaged in activities/ talk w/ friends 2) decreased depressed moods 3) ---   Client Statement of Needs: "Get out of this mood I'm in. "   Treatment Level:out pt counseling  Symptoms:depressed mood, tearfulness, loss of interest, difficulty w/ motivation, withdrawn from interacting.   Client Treatment Preferences:biweekly in person counseling.  Continue medication management w/ PCP.    Healthcare consumer's goal for treatment:  Counselor, Forde Radon, [facility] will support the patient's ability to achieve the goals identified. Cognitive Behavioral Therapy, Assertive Communication/Conflict Resolution Training, Relaxation Training, ACT, Humanistic and other evidenced-based practices will be used to promote progress towards healthy functioning.   Healthcare consumer will: Actively participate in therapy, working towards healthy functioning.    *Justification for Continuation/Discontinuation of Goal: R=Revised, O=Ongoing, A=Achieved, D=Discontinued  Goal 1)  Increased engagement w/ activities she enjoys and w/ social interactions AEB pt report and counselor observation. Baseline date 12/02/22: Progress towards goal 0; How Often - Daily Target Date Goal Was reviewed Status Code Progress towards goal/Likert rating  12/02/23                Goal 2) Increased coping w/ losses and w/ depressed moods AEB utilizing coping skills daily.   Baseline date 12/02/22: Progress towards goal 0;  How Often - Daily Target Date Goal Was reviewed Status Code Progress towards goal  12/02/23                  This plan has been reviewed and created by the following participants:  This plan will be reviewed at least every 12  months. Date Behavioral Health Clinician Date Guardian/Patient   12/02/22  [facility] Ophelia Charter [facility] 12/02/22 Verbal Consent Provided                      Forde Radon [facility]  [place], [facility]

## 2022-12-16 NOTE — Progress Notes (Signed)
? ? ? ? ? ? ? ? ? ? ? ? ? ? ?  Gerson Fauth, LCMHC ?

## 2022-12-17 ENCOUNTER — Telehealth: Payer: Self-pay

## 2022-12-17 DIAGNOSIS — G4733 Obstructive sleep apnea (adult) (pediatric): Secondary | ICD-10-CM | POA: Diagnosis not present

## 2022-12-17 NOTE — Progress Notes (Signed)
Care Management & Coordination Services Pharmacy Team  Reason for Encounter: Medication coordination and delivery  Contacted patient to discuss medications and coordinate delivery from [facility]. Spoke with patient on 12/17/2022   Cycle dispensing form sent to [facility] for review.   Last adherence delivery date:11/30/2022      Patient is due for next adherence delivery on: 12/29/2022  This delivery to include: Adherence Packaging  30 Days  Repatha 140 mg/ml - inject 140 mg every 14 days Pantoprazole 40 mg - 1 tablet at breakfast Sertraline 50 mg - take one tablet at lunch Famotidine 20 mg - take one tablet at breakfast and one tablet at bedtime Rosuvastatin 20 mg - take one tablet at bedtime Metoprolol Suc 100 mg - take one tablet at lunch Losartan 100 mg - take one tablet at breakfast HCTZ 25 mg - take one tablet at breakfast Myrbetriq 25 mg - take one tablet at breakfast every other day (vial)  (See refill history) Gabapentin 300 mg - 1 tablet at bedtime   (See refill history)  Patient declined the following medications this month: None  Confirmed delivery date of 12/29/2022, advised patient that pharmacy will contact them the morning of delivery.  Any concerns about your medications? Patient denies  How often do you forget or accidentally miss a dose? Patient denies  Is patient in packaging Yes  If yes  What is the date on your next pill pack? 12/17/2022  Any concerns or issues with your packaging? Patient denies   Recent blood pressure readings are as follows: Patient denies  Recent blood glucose readings are as follows:  Patient denies   Chart review: Recent office visits:  None  Recent consult visits:  12/16/2022 Forde Radon [facility] - Patient was seen for major depressive disorder.   12/02/2022  Forde Radon [facility] - Patient was seen for major depressive disorder.  Hospital visits:  Patient was seen at [facility] on 11/19/2022  (45 min) due to rash and nonspecific skin eruption.  New?Medications Started at [facility] Discharge:?? Zyrtec 5 mg daily Medication Changes at Hospital Discharge: None Medications Discontinued at Hospital Discharge: None Medications that remain the same after Hospital Discharge:??  -All other medications will remain the same.    Medications: Outpatient Encounter Medications as of 12/17/2022  Medication Sig   acetaminophen (TYLENOL) 500 MG tablet Take 1,000 mg by mouth as needed for moderate pain or headache.    albuterol (VENTOLIN HFA) 108 (90 Base) MCG/ACT inhaler INHALE ONE OR TWO PUFFS BY MOUTH INTO THE LUNGS EVERY SIX HOURS AS NEEDED FOR WHEEZING OR SHORTNESS OF BREATH   aspirin 81 MG chewable tablet Chew 81 mg by mouth daily.   Budeson-Glycopyrrol-Formoterol (BREZTRI AEROSPHERE) 160-9-4.8 MCG/ACT AERO Inhale 2 puffs into the lungs in the morning and at bedtime.   cetirizine (ZYRTEC) 5 MG tablet Take 1 tablet (5 mg total) by mouth daily.   COVID-19 mRNA vaccine 2023-2024 (COMIRNATY) syringe Inject into the muscle.   Evolocumab (REPATHA SURECLICK) 140 MG/ML SOAJ Inject 140 mg into the skin every 14 (fourteen) days.   famotidine (PEPCID) 20 MG tablet TAKE ONE TABLET BY MOUTH EVERY MORNING and TAKE ONE TABLET BY MOUTH EVERYDAY AT BEDTIME   fluticasone (FLONASE) 50 MCG/ACT nasal spray PLACE 2 SPRAYS INTO BOTH NOSTRILS AT BEDTIME AS NEEDED FOR ALLERGIES.   gabapentin (NEURONTIN) 300 MG capsule TAKE ONE CAPSULE BY MOUTH AT BREAKFAST AND AT BEDTIME   hydrochlorothiazide (HYDRODIURIL) 25 MG tablet TAKE ONE TABLET BY MOUTH ONCE  DAILY   losartan (COZAAR) 100 MG tablet TAKE ONE TABLET BY MOUTH EVERY MORNING   metoprolol succinate (TOPROL-XL) 100 MG 24 hr tablet TAKE ONE TABLET BY MOUTH AT NOON   montelukast (SINGULAIR) 10 MG tablet TAKE ONE TABLET BY MOUTH EVERYDAY AT BEDTIME   MYRBETRIQ 50 MG TB24 tablet Take 50 mg by mouth daily.   nitroGLYCERIN (NITROSTAT) 0.4 MG SL tablet Place 1 tablet (0.4  mg total) under the tongue every 5 (five) minutes as needed for chest pain.   ondansetron (ZOFRAN-ODT) 4 MG disintegrating tablet 4mg  ODT q4 hours prn nausea/vomit   pantoprazole (PROTONIX) 40 MG tablet TAKE ONE TABLET BY MOUTH ONCE DAILY   rosuvastatin (CRESTOR) 20 MG tablet Take 1 tablet (20 mg total) by mouth at bedtime.   sertraline (ZOLOFT) 50 MG tablet TAKE ONE TABLET BY MOUTH AT NOON   traMADol (ULTRAM) 50 MG tablet Take 1 tablet (50 mg total) by mouth every 6 (six) hours as needed.   triamcinolone cream (KENALOG) 0.1 % Apply 1 Application topically 3 times/day as needed-between meals & bedtime.   No facility-administered encounter medications on file as of 12/17/2022.   BP Readings from Last 3 Encounters:  11/19/22 (!) 157/96  11/17/22 120/80  10/19/22 126/72    Pulse Readings from Last 3 Encounters:  11/19/22 96  11/17/22 79  10/19/22 79    Lab Results  Component Value Date/Time   HGBA1C 6.1 (A) 09/27/2022 01:03 PM   HGBA1C 6.3 03/24/2022 03:14 PM   HGBA1C 5.9 04/17/2021 12:47 PM   Lab Results  Component Value Date   CREATININE 1.65 (H) 07/05/2022   BUN 19 07/05/2022   GFR 29.60 (L) 07/05/2022   GFRNONAA 34 (L) 06/14/2022   GFRAA 40 (L) 07/07/2020   NA 139 07/05/2022   K 3.6 07/05/2022   CALCIUM 9.6 07/05/2022   CO2 29 07/05/2022   Inetta Fermo CMA  Clinical Pharmacist Assistant [phone]

## 2022-12-30 ENCOUNTER — Ambulatory Visit (INDEPENDENT_AMBULATORY_CARE_PROVIDER_SITE_OTHER): Payer: Medicare HMO | Admitting: Psychology

## 2022-12-30 DIAGNOSIS — F331 Major depressive disorder, recurrent, moderate: Secondary | ICD-10-CM

## 2022-12-30 NOTE — Progress Notes (Signed)
Behavioral Health Counselor/Therapist Progress Note  Patient ID: [name], MRN: [id number],    Date: 12/30/2022  Time Spent: 9:52am-10:57am  pt is seen for an in person visit.     Treatment Type: Individual Therapy  Reported Symptoms: some days difficulty initiating, some improvement this past week.  Mental Status Exam: Appearance:  Well Groomed     Behavior: Appropriate  Motor: Normal  Speech/Language:  Normal Rate  Affect: Appropriate  Mood: depressed  Thought process: normal  Thought content:   WNL  Sensory/Perceptual disturbances:   WNL  Orientation: oriented to person, place, time/date, and situation  Attention: Good  Concentration: Good  Memory: WNL  Fund of knowledge:  Good  Insight:   Good  Judgment:  Good  Impulse Control: Good   Risk Assessment: Danger to Self:  No Self-injurious Behavior: No Danger to Others: No Duty to Warn:no Physical Aggression / Violence:No  Access to Firearms a concern: No  Gang Involvement:No   Subjective: Counselor assessed pt current functioning per pt report. Processed w/pt motivation and fatigue.  Explored w/ pt things she did do over past week and acknowledging things she enjoyed.   Discussed behavioral activation and areas to continue w/ engaging. Discussed losses and how interactions different since.  Pt affect wnl.  Pt reports some days fatigue and not wanting to get up.  Pt reports she has gotten up every day and gotten ready and only one day not out of this house in past week.  Pt reported on things she has enjoyed.  Pt acknowledged energy level likely different as different stage of life.  Pt discussed ways she is engaging and enjoying things.  Pt reported on how misses brother and sister and engagement that they had.    Interventions: Cognitive Behavioral Therapy and behavioral activation  Diagnosis:Major depressive disorder, recurrent episode, moderate (HCC)  Plan: pt to f/u in 2 weeks for counseling.  Pt to f/u  as scheduled w/ PCP.      Individualized Treatment Plan Strengths: enjoys time w/ daughter, son and w/ extended family and church community  Supports: her daughter   Goal/Needs for Treatment:  In order of importance to patient 1) engaged in activities/ talk w/ friends 2) decreased depressed moods 3) ---   Client Statement of Needs: "Get out of this mood I'm in. "   Treatment Level:out pt counseling  Symptoms:depressed mood, tearfulness, loss of interest, difficulty w/ motivation, withdrawn from interacting.   Client Treatment Preferences:biweekly in person counseling.  Continue medication management w/ PCP.    Healthcare consumer's goal for treatment:  Counselor, [name], [facility] will support the patient's ability to achieve the goals identified. Cognitive Behavioral Therapy, Assertive Communication/Conflict Resolution Training, Relaxation Training, ACT, Humanistic and other evidenced-based practices will be used to promote progress towards healthy functioning.   Healthcare consumer will: Actively participate in therapy, working towards healthy functioning.    *Justification for Continuation/Discontinuation of Goal: R=Revised, O=Ongoing, A=Achieved, D=Discontinued  Goal 1)  Increased engagement w/ activities she enjoys and w/ social interactions AEB pt report and counselor observation. Baseline date 12/02/22: Progress towards goal 0; How Often - Daily Target Date Goal Was reviewed Status Code Progress towards goal/Likert rating  12/02/23                Goal 2) Increased coping w/ losses and w/ depressed moods AEB utilizing coping skills daily.   Baseline date 12/02/22: Progress towards goal 0; How Often - Daily Target Date Goal Was reviewed Status Code  Progress towards goal  12/02/23                  This plan has been reviewed and created by the following participants:  This plan will be reviewed at least every 12 months. Date Behavioral Health Clinician Date Guardian/Patient    12/02/22  [facility] [name] [facility] 12/02/22 Verbal Consent Provided                      [name] [facility]                 [name], [facility]

## 2023-01-03 ENCOUNTER — Ambulatory Visit: Payer: Medicare HMO | Attending: Internal Medicine

## 2023-01-03 DIAGNOSIS — E1169 Type 2 diabetes mellitus with other specified complication: Secondary | ICD-10-CM | POA: Diagnosis not present

## 2023-01-03 DIAGNOSIS — E785 Hyperlipidemia, unspecified: Secondary | ICD-10-CM | POA: Diagnosis not present

## 2023-01-04 LAB — NMR, LIPOPROFILE
Cholesterol, Total: 107 mg/dL (ref 100–199)
HDL Particle Number: 34.2 umol/L (ref >=30.5)
HDL-C: 56 mg/dL (ref >39)
LDL Particle Number: 300 nmol/L (ref <1000)
LDL-C (NIH Calc): 32 mg/dL (ref 0–99)
LP-IR Score: 34 (ref <=45)
Small LDL Particle Number: 127 nmol/L (ref <=527)
Triglycerides: 104 mg/dL (ref 0–149)

## 2023-01-17 DIAGNOSIS — G4733 Obstructive sleep apnea (adult) (pediatric): Secondary | ICD-10-CM | POA: Diagnosis not present

## 2023-01-18 ENCOUNTER — Telehealth: Payer: Self-pay | Admitting: Internal Medicine

## 2023-01-18 DIAGNOSIS — E1169 Type 2 diabetes mellitus with other specified complication: Secondary | ICD-10-CM

## 2023-01-18 MED ORDER — ROSUVASTATIN CALCIUM 10 MG PO TABS
10.0000 mg | ORAL_TABLET | Freq: Every day | ORAL | 3 refills | Status: DC
Start: 2023-01-18 — End: 2023-12-02

## 2023-01-18 NOTE — Telephone Encounter (Signed)
New Rx for Crestor 10mg  sent to pharmacy

## 2023-01-18 NOTE — Telephone Encounter (Signed)
Pt c/o medication issue:  1. Name of Medication: rosuvastatin (CRESTOR) 20 MG tablet   2. How are you currently taking this medication (dosage and times per day)? Not sure   3. Are you having a reaction (difficulty breathing--STAT)? No   4. What is your medication issue? Pharmacy called in stating pt reports that this med was changed to 10mg  by Dr. Tenny Craw and they would need a new rx for that. Please advise.

## 2023-01-20 ENCOUNTER — Ambulatory Visit: Payer: Medicare HMO | Admitting: Psychology

## 2023-01-20 DIAGNOSIS — F331 Major depressive disorder, recurrent, moderate: Secondary | ICD-10-CM

## 2023-01-20 NOTE — Progress Notes (Signed)
[facility] Counselor/Therapist Progress Note  Patient ID: NAELA RICHMAN, MRN: [id number],    Date: 01/20/2023  Time Spent: 11:01am-11:56am  pt is seen for an in person visit.     Treatment Type: Individual Therapy  Reported Symptoms: some days down or difficulty initiating, positive interactions w/ friends and family.  Mental Status Exam: Appearance:  Well Groomed     Behavior: Appropriate  Motor: Normal  Speech/Language:  Normal Rate  Affect: Appropriate  Mood: down  Thought process: normal  Thought content:   WNL  Sensory/Perceptual disturbances:   WNL  Orientation: oriented to person, place, time/date, and situation  Attention: Good  Concentration: Good  Memory: WNL  Fund of knowledge:  Good  Insight:   Good  Judgment:  Good  Impulse Control: Good   Risk Assessment: Danger to Self:  No Self-injurious Behavior: No Danger to Others: No Duty to Warn:no Physical Aggression / Violence:No  Access to Firearms a concern: No  Gang Involvement:No   Subjective: Counselor assessed pt current functioning per pt report. Processed w/pt mood and motivation.  Explored w/ pt positive interactions and engagement in activities in past couple of weeks.  Discussed behavioral activation and areas to continue w/ engaging. Explored avoidance and acknowledging need to not avoid and remind of how positive to engage.  Pt affect wnl.  Pt reports some days fatigue and not wanting to get up.  Pt reports she has gotten up every day and gotten ready and only one day not out of this house in past week.  Pt reported on things she has enjoyed.  Pt acknowledged energy level likely different as different stage of life.  Pt discussed ways she is engaging and enjoying things.  Pt reported on how misses brother and sister and engagement that they had.    Interventions: Cognitive Behavioral Therapy and behavioral activation  Diagnosis:Major depressive disorder, recurrent episode, moderate  (HCC)  Plan: pt to f/u in 2 weeks for counseling.  Pt to f/u as scheduled w/ PCP.      Individualized Treatment Plan Strengths: enjoys time w/ daughter, son and w/ extended family and church community  Supports: her daughter   Goal/Needs for Treatment:  In order of importance to patient 1) engaged in activities/ talk w/ friends 2) decreased depressed moods 3) ---   Client Statement of Needs: "Get out of this mood I'm in. "   Treatment Level:out pt counseling  Symptoms:depressed mood, tearfulness, loss of interest, difficulty w/ motivation, withdrawn from interacting.   Client Treatment Preferences:biweekly in person counseling.  Continue medication management w/ PCP.    Healthcare consumer's goal for treatment:  Counselor, Forde Radon, [facility] will support the patient's ability to achieve the goals identified. Cognitive Behavioral Therapy, Assertive Communication/Conflict Resolution Training, Relaxation Training, ACT, Humanistic and other evidenced-based practices will be used to promote progress towards healthy functioning.   Healthcare consumer will: Actively participate in therapy, working towards healthy functioning.    *Justification for Continuation/Discontinuation of Goal: R=Revised, O=Ongoing, A=Achieved, D=Discontinued  Goal 1)  Increased engagement w/ activities she enjoys and w/ social interactions AEB pt report and counselor observation. Baseline date 12/02/22: Progress towards goal 0; How Often - Daily Target Date Goal Was reviewed Status Code Progress towards goal/Likert rating  12/02/23                Goal 2) Increased coping w/ losses and w/ depressed moods AEB utilizing coping skills daily.   Baseline date 12/02/22: Progress towards goal 0; How Often -  Daily Target Date Goal Was reviewed Status Code Progress towards goal  12/02/23                  This plan has been reviewed and created by the following participants:  This plan will be reviewed at least every 12  months. Date Behavioral Health Clinician Date Guardian/Patient   12/02/22  [facility] 12/02/22 Verbal Consent Provided                      Forde Radon [facility]

## 2023-01-20 NOTE — Progress Notes (Deleted)
? ? ? ? ? ? ? ? ? ? ? ? ? ? ?  Shardai Star, LCMHC ?

## 2023-01-24 DIAGNOSIS — N3000 Acute cystitis without hematuria: Secondary | ICD-10-CM | POA: Diagnosis not present

## 2023-01-24 DIAGNOSIS — R8279 Other abnormal findings on microbiological examination of urine: Secondary | ICD-10-CM | POA: Diagnosis not present

## 2023-02-13 ENCOUNTER — Emergency Department (HOSPITAL_BASED_OUTPATIENT_CLINIC_OR_DEPARTMENT_OTHER)
Admission: EM | Admit: 2023-02-13 | Discharge: 2023-02-13 | Disposition: A | Discharge status: Home / Self Care | Payer: Medicare HMO | Attending: Emergency Medicine | Admitting: Emergency Medicine

## 2023-02-13 ENCOUNTER — Emergency Department (HOSPITAL_BASED_OUTPATIENT_CLINIC_OR_DEPARTMENT_OTHER): Payer: Medicare HMO

## 2023-02-13 ENCOUNTER — Other Ambulatory Visit: Payer: Self-pay

## 2023-02-13 ENCOUNTER — Encounter (HOSPITAL_BASED_OUTPATIENT_CLINIC_OR_DEPARTMENT_OTHER): Payer: Self-pay

## 2023-02-13 DIAGNOSIS — J449 Chronic obstructive pulmonary disease, unspecified: Secondary | ICD-10-CM | POA: Insufficient documentation

## 2023-02-13 DIAGNOSIS — N189 Chronic kidney disease, unspecified: Secondary | ICD-10-CM | POA: Insufficient documentation

## 2023-02-13 DIAGNOSIS — Z79899 Other long term (current) drug therapy: Secondary | ICD-10-CM | POA: Diagnosis not present

## 2023-02-13 DIAGNOSIS — R42 Dizziness and giddiness: Secondary | ICD-10-CM | POA: Insufficient documentation

## 2023-02-13 DIAGNOSIS — E1140 Type 2 diabetes mellitus with diabetic neuropathy, unspecified: Secondary | ICD-10-CM | POA: Insufficient documentation

## 2023-02-13 DIAGNOSIS — R519 Headache, unspecified: Secondary | ICD-10-CM | POA: Diagnosis not present

## 2023-02-13 DIAGNOSIS — E1122 Type 2 diabetes mellitus with diabetic chronic kidney disease: Secondary | ICD-10-CM | POA: Diagnosis not present

## 2023-02-13 DIAGNOSIS — Z7982 Long term (current) use of aspirin: Secondary | ICD-10-CM | POA: Insufficient documentation

## 2023-02-13 DIAGNOSIS — R0989 Other specified symptoms and signs involving the circulatory and respiratory systems: Secondary | ICD-10-CM | POA: Insufficient documentation

## 2023-02-13 DIAGNOSIS — I129 Hypertensive chronic kidney disease with stage 1 through stage 4 chronic kidney disease, or unspecified chronic kidney disease: Secondary | ICD-10-CM | POA: Diagnosis not present

## 2023-02-13 DIAGNOSIS — Z7951 Long term (current) use of inhaled steroids: Secondary | ICD-10-CM | POA: Diagnosis not present

## 2023-02-13 DIAGNOSIS — J3489 Other specified disorders of nose and nasal sinuses: Secondary | ICD-10-CM

## 2023-02-13 DIAGNOSIS — Z20822 Contact with and (suspected) exposure to covid-19: Secondary | ICD-10-CM | POA: Diagnosis not present

## 2023-02-13 LAB — CBC WITH DIFFERENTIAL/PLATELET
Abs Immature Granulocytes: 0.04 10*3/uL (ref 0.00–0.07)
Basophils Absolute: 0.1 10*3/uL (ref 0.0–0.1)
Basophils Relative: 1 %
Eosinophils Absolute: 0.3 10*3/uL (ref 0.0–0.5)
Eosinophils Relative: 3 %
HCT: 43.9 % (ref 36.0–46.0)
Hemoglobin: 14.3 g/dL (ref 12.0–15.0)
Immature Granulocytes: 0 %
Lymphocytes Relative: 45 %
Lymphs Abs: 4.2 10*3/uL — ABNORMAL HIGH (ref 0.7–4.0)
MCH: 28.6 pg (ref 26.0–34.0)
MCHC: 32.6 g/dL (ref 30.0–36.0)
MCV: 87.8 fL (ref 80.0–100.0)
Monocytes Absolute: 0.9 10*3/uL (ref 0.1–1.0)
Monocytes Relative: 10 %
Neutro Abs: 3.9 10*3/uL (ref 1.7–7.7)
Neutrophils Relative %: 41 %
Platelets: 262 10*3/uL (ref 150–400)
RBC: 5 MIL/uL (ref 3.87–5.11)
RDW: 16.1 % — ABNORMAL HIGH (ref 11.5–15.5)
WBC: 9.4 10*3/uL (ref 4.0–10.5)
nRBC: 0 % (ref 0.0–0.2)

## 2023-02-13 LAB — RESP PANEL BY RT-PCR (RSV, FLU A&B, COVID)  RVPGX2
Influenza A by PCR: NEGATIVE
Influenza B by PCR: NEGATIVE
Resp Syncytial Virus by PCR: NEGATIVE
SARS Coronavirus 2 by RT PCR: NEGATIVE

## 2023-02-13 LAB — URINALYSIS, ROUTINE W REFLEX MICROSCOPIC
Bilirubin Urine: NEGATIVE
Glucose, UA: NEGATIVE mg/dL
Hgb urine dipstick: NEGATIVE
Ketones, ur: NEGATIVE mg/dL
Nitrite: NEGATIVE
Protein, ur: NEGATIVE mg/dL
Specific Gravity, Urine: 1.014 (ref 1.005–1.030)
pH: 7 (ref 5.0–8.0)

## 2023-02-13 LAB — BASIC METABOLIC PANEL
Anion gap: 7 (ref 5–15)
BUN: 19 mg/dL (ref 8–23)
CO2: 29 mmol/L (ref 22–32)
Calcium: 9.7 mg/dL (ref 8.9–10.3)
Chloride: 102 mmol/L (ref 98–111)
Creatinine, Ser: 1.51 mg/dL — ABNORMAL HIGH (ref 0.44–1.00)
GFR, Estimated: 35 mL/min — ABNORMAL LOW (ref >60)
Glucose, Bld: 69 mg/dL — ABNORMAL LOW (ref 70–99)
Potassium: 3.5 mmol/L (ref 3.5–5.1)
Sodium: 138 mmol/L (ref 135–145)

## 2023-02-13 MED ORDER — MECLIZINE HCL 25 MG PO TABS: 25.0000 mg | ORAL_TABLET | Freq: Three times a day (TID) | ORAL | 0 refills | Status: DC | PRN

## 2023-02-13 MED ORDER — MECLIZINE HCL 25 MG PO TABS
25.0000 mg | ORAL_TABLET | Freq: Once | ORAL | Status: AC
  Administered 2023-02-13: 25 mg via ORAL
  Filled 2023-02-13: qty 1

## 2023-02-13 NOTE — ED Triage Notes (Signed)
Patient arrives with complaints of worsening sinus pain and headache. Patient rates pain 8/10.

## 2023-02-13 NOTE — Discharge Instructions (Signed)
As we discussed in the room you talked about going over to [facility] for an MRI which you declined.  We discussed risk of missed stroke.  We have written you prescription for meclizine which can help with dizziness.  With regarding your runny nose may continue zinc Flonase and antihistamines at home.  Follow-up outpatient, return for new or worsening symptoms.

## 2023-02-13 NOTE — ED Provider Notes (Signed)
[place] EMERGENCY DEPARTMENT AT [facility] Provider Note   CSN: [id number] Arrival date & time: 02/13/23  1439     History  Chief Complaint  Patient presents with   Sinusitis    [name] is a 77 y.o. female with diabetes, hypertension, CKD, COPD, neuropathy, paternal left wrist here for evaluation of multiple complaints.  Patient states she has had some congestion, rhinorrhea, headache, dizziness.  No sudden onset thunderclap headache.  Headache described as pressure.  She also states she feels dizzy however has difficult time describing this.  Does have positional component.  She has chronic numbness to her extremities from her neuropathy as well as from her carpal tunnel.  She thinks this is at her baseline however is not sure.  Does not typically get dizziness.  Does get headaches.  Describes it as more of a pressure sensation to the frontal aspect of her head.  No sick contacts, fever, neck pain, cough, chest pain, shortness of breath, abdominal pain  HPI     Home Medications Prior to Admission medications   Medication Sig Start Date End Date Taking? Authorizing Provider  meclizine (ANTIVERT) 25 MG tablet Take 1 tablet (25 mg total) by mouth 3 (three) times daily as needed for dizziness. 02/13/23  Yes Tallula Grindle A, PA-C  acetaminophen (TYLENOL) 500 MG tablet Take 1,000 mg by mouth as needed for moderate pain or headache.     [provider]  albuterol (VENTOLIN HFA) 108 (90 Base) MCG/ACT inhaler INHALE ONE OR TWO PUFFS BY MOUTH INTO THE LUNGS EVERY SIX HOURS AS NEEDED FOR WHEEZING OR SHORTNESS OF BREATH 04/20/22   Jetty Duhamel D, MD  aspirin 81 MG chewable tablet Chew 81 mg by mouth daily.    [provider]  Budeson-Glycopyrrol-Formoterol (BREZTRI AEROSPHERE) 160-9-4.8 MCG/ACT AERO Inhale 2 puffs into the lungs in the morning and at bedtime. 07/22/22   Waymon Budge, MD  cetirizine (ZYRTEC) 5 MG tablet Take 1 tablet (5 mg total) by mouth  daily. 11/19/22 12/19/22  Garrison, [place] N, FNP  COVID-19 mRNA vaccine [phone] (COMIRNATY) syringe Inject into the muscle. 09/29/22     Evolocumab (REPATHA SURECLICK) 140 MG/ML SOAJ Inject 140 mg into the skin every 14 (fourteen) days. 10/27/22   Pricilla Riffle, MD  famotidine (PEPCID) 20 MG tablet TAKE ONE TABLET BY MOUTH EVERY MORNING and TAKE ONE TABLET BY MOUTH EVERYDAY AT BEDTIME 06/21/22   [place], [name], MD  fluticasone (FLONASE) 50 MCG/ACT nasal spray PLACE 2 SPRAYS INTO BOTH NOSTRILS AT BEDTIME AS NEEDED FOR ALLERGIES. 12/15/22   [place], [name], MD  gabapentin (NEURONTIN) 300 MG capsule TAKE ONE CAPSULE BY MOUTH AT BREAKFAST AND AT BEDTIME 11/03/22   Penumalli, Glenford Bayley, MD  hydrochlorothiazide (HYDRODIURIL) 25 MG tablet TAKE ONE TABLET BY MOUTH ONCE DAILY 10/18/22   [place], [name], MD  losartan (COZAAR) 100 MG tablet TAKE ONE TABLET BY MOUTH EVERY MORNING 10/18/22   [place], [name], MD  metoprolol succinate (TOPROL-XL) 100 MG 24 hr tablet TAKE ONE TABLET BY MOUTH AT NOON 10/18/22   [place], [name], MD  montelukast (SINGULAIR) 10 MG tablet TAKE ONE TABLET BY MOUTH EVERYDAY AT BEDTIME 03/22/22   [place], [name], MD  MYRBETRIQ 50 MG TB24 tablet Take 50 mg by mouth daily. 10/29/21   [provider]  nitroGLYCERIN (NITROSTAT) 0.4 MG SL tablet Place 1 tablet (0.4 mg total) under the tongue every 5 (five) minutes as needed for chest pain. 05/26/21  Pricilla Riffle, MD  ondansetron (ZOFRAN-ODT) 4 MG disintegrating tablet 4mg  ODT q4 hours prn nausea/vomit 08/01/21   Melene Plan, DO  pantoprazole (PROTONIX) 40 MG tablet TAKE ONE TABLET BY MOUTH ONCE DAILY 10/18/22   Cirigliano, Vito V, DO  rosuvastatin (CRESTOR) 10 MG tablet Take 1 tablet (10 mg total) by mouth daily. 01/18/23 01/18/24  Pricilla Riffle, MD  sertraline (ZOLOFT) 50 MG tablet TAKE ONE TABLET BY MOUTH AT [facility] 11/17/22   [place], [name], MD  traMADol (ULTRAM) 50 MG tablet Take 1 tablet (50 mg total) by mouth every 6 (six) hours as needed.  04/13/22 04/13/23  Cornett, Maisie Fus, MD  triamcinolone cream (KENALOG) 0.1 % Apply 1 Application topically 3 times/day as needed-between meals & bedtime. 11/17/22   [place], [name], MD      Allergies    Oxycodone-acetaminophen, Percocet [oxycodone-acetaminophen], Pravastatin sodium, Hydrocodone, and Aspirin    Review of Systems   Review of Systems  Constitutional: Negative.   HENT:  Positive for congestion and rhinorrhea. Negative for sore throat, tinnitus, trouble swallowing and voice change.   Respiratory: Negative.    Cardiovascular: Negative.   Gastrointestinal: Negative.   Genitourinary: Negative.   Musculoskeletal: Negative.   Skin: Negative.   Neurological:  Positive for dizziness, facial asymmetry, numbness (chronic) and headaches. Negative for tremors, seizures, syncope, speech difficulty, weakness and light-headedness.  All other systems reviewed and are negative.   Physical Exam Updated Vital Signs BP 131/65 (BP Location: Right Arm)   Pulse 70   Temp 98 F (36.7 C) (Oral)   Resp 16   Ht 5\' 6"  (1.676 m)   Wt 102.6 kg   SpO2 99%   BMI 36.51 kg/m  Physical Exam Vitals and nursing note reviewed.  Constitutional:      General: She is not in acute distress.    Appearance: She is well-developed. She is not ill-appearing.  HENT:     Head: Normocephalic and atraumatic.     Jaw: There is normal jaw occlusion.     Comments: Tenderness over bilateral maxillary/ frontal sinuses. Minimal clear rhinorrhea    Nose: Nose normal.     Mouth/Throat:     Mouth: Mucous membranes are moist.  Eyes:     Extraocular Movements: Extraocular movements intact.     Conjunctiva/sclera: Conjunctivae normal.     Pupils: Pupils are equal, round, and reactive to light.  Cardiovascular:     Rate and Rhythm: Normal rate.     Pulses:          Radial pulses are 2+ on the right side and 2+ on the left side.       Dorsalis pedis pulses are 1+ on the right side and 1+ on the left side.     Heart  sounds: Normal heart sounds.  Pulmonary:     Effort: Pulmonary effort is normal. No respiratory distress.     Breath sounds: Normal breath sounds and air entry.     Comments: Clear bilaterally, speaks in full sentences without difficulty Abdominal:     General: Bowel sounds are normal. There is no distension.     Palpations: Abdomen is soft.     Tenderness: There is no abdominal tenderness. There is no right CVA tenderness, left CVA tenderness or guarding.  Musculoskeletal:        General: Normal range of motion.     Cervical back: Full passive range of motion without pain, normal range of motion and neck supple.  Comments: No bony tenderness, full range of motion,  Skin:    General: Skin is warm and dry.  Neurological:     General: No focal deficit present.     Mental Status: She is alert.     Cranial Nerves: Cranial nerves 2-12 are intact.     Sensory: Sensory deficit present.     Motor: Motor function is intact.     Comments: Cranial nerves II through XII grossly intact Equal strength bilaterally to upper and lower extremities Decreased sensation to left hand Ambulatory with assistance, chronic  Psychiatric:        Mood and Affect: Mood normal.     ED Results / Procedures / Treatments   Labs (all labs ordered are listed, but only abnormal results are displayed) Labs Reviewed  CBC WITH DIFFERENTIAL/PLATELET - Abnormal; Notable for the following components:      Result Value   RDW 16.1 (*)    Lymphs Abs 4.2 (*)    All other components within normal limits  BASIC METABOLIC PANEL - Abnormal; Notable for the following components:   Glucose, Bld 69 (*)    Creatinine, Ser 1.51 (*)    GFR, Estimated 35 (*)    All other components within normal limits  URINALYSIS, ROUTINE W REFLEX MICROSCOPIC - Abnormal; Notable for the following components:   Leukocytes,Ua SMALL (*)    Bacteria, UA RARE (*)    All other components within normal limits  RESP PANEL BY RT-PCR (RSV, FLU A&B,  COVID)  RVPGX2    EKG None  Radiology CT Head Wo Contrast  Result Date: 02/13/2023 CLINICAL DATA:  Vertigo headache EXAM: CT HEAD WITHOUT CONTRAST TECHNIQUE: Contiguous axial images were obtained from the base of the skull through the vertex without intravenous contrast. RADIATION DOSE REDUCTION: This exam was performed according to the departmental dose-optimization program which includes automated exposure control, adjustment of the mA and/or kV according to patient size and/or use of iterative reconstruction technique. COMPARISON:  CT 01/25/2021 FINDINGS: Brain: No acute territorial infarction or hemorrhage is visualized. 6 mm fat density mass at the right CP angle likely reflecting a small lipoma, no change compared to prior, series 2, image 6. Ventricles are nonenlarged Vascular: No hyperdense vessels.  Carotid vascular calcification Skull: Normal. Negative for fracture or focal lesion. Sinuses/Orbits: No acute finding. Other: None IMPRESSION: 1. No CT evidence for acute intracranial abnormality. 2. 6 mm fat density mass at the right CP angle likely reflecting a small lipoma, no change compared to prior. Electronically Signed   By: Jasmine Pang M.D.   On: 02/13/2023 17:56    Procedures Procedures    Medications Ordered in ED Medications  meclizine (ANTIVERT) tablet 25 mg (25 mg Oral Given 02/13/23 1638)   ED Course/ Medical Decision Making/ A&P   77 year old multiple medical problems here for evaluation of congestion, rhinorrhea, headache and dizziness.  She appears otherwise well.  She has some chronic left hand numbness at baseline however has no weakness.  No facial droop.  She has difficult time describing her dizziness.  There is some positional component.  She denies any sudden onset thunderclap headache.  Symptoms started when she developed some rhinorrhea.  Has been using antihistamines and Flonase.  Her heart and lungs are clear.  Abdomen is soft, nontender.  She has some chronic  numbness to her lower extremities due to her neuropathy.  Will plan on labs, imaging and reassess  Labs and imaging personally viewed interpreted:  CBC without leukocytosis 9  metabolic panel creatinine 1.51, GFR 35 UA negative for infection COVID, flu, RSV negative CT head without significant abnormality  Patient reassessed.  Meclizine helped her dizziness.  Given her risk factors I did recommend transportation to [facility] for MRI to rule out posterior CVA as cause of her dizziness.  Patient declined.  We discussed risk versus benefit with patient, family in room.  She voiced understanding of risk versus benefit to include missed CVA as cause of her dizziness however she continues to decline transport for further imaging.  Did write prescription for meclizine in the meantime.  She has no evidence of bacterial sinusitis on her CT scan.  Discussed compliance with Flonase, her home antihistamines and close help with primary care provider.  She will return for new or worsening symptoms.  Low suspicion for dural venous sinus thrombosis as cause of her symptoms.  The patient has been appropriately medically screened and/or stabilized in the ED. I have low suspicion for any other emergent medical condition which would require further screening, evaluation or treatment in the ED or require inpatient management.  Patient is hemodynamically stable and in no acute distress.  Patient able to ambulate in department prior to ED.  Evaluation does not show acute pathology that would require ongoing or additional emergent interventions while in the emergency department or further inpatient treatment.  I have discussed the diagnosis with the patient and answered all questions.  Pain is been managed while in the emergency department and patient has no further complaints prior to discharge.  Patient is comfortable with plan discussed in room and is stable for discharge at this time.  I have discussed strict return  precautions for returning to the emergency department.  Patient was encouraged to follow-up with PCP/specialist refer to at discharge.                            Medical Decision Making Amount and/or Complexity of Data Reviewed Independent Historian: friend External Data Reviewed: labs, radiology, ECG and notes. Labs: ordered. Decision-making details documented in ED Course. Radiology: ordered and independent interpretation performed. Decision-making details documented in ED Course. ECG/medicine tests: ordered and independent interpretation performed. Decision-making details documented in ED Course.  Risk OTC drugs. Prescription drug management. Decision regarding hospitalization. Diagnosis or treatment significantly limited by social determinants of health.           Final Clinical Impression(s) / ED Diagnoses Final diagnoses:  Dizziness  Rhinorrhea    Rx / DC Orders ED Discharge Orders          Ordered    meclizine (ANTIVERT) 25 MG tablet  3 times daily PRN        02/13/23 1920              Mahrukh Seguin A, PA-C 02/13/23 1937    Tegeler, Canary Brim, MD 02/13/23 [phone]

## 2023-02-16 DIAGNOSIS — G4733 Obstructive sleep apnea (adult) (pediatric): Secondary | ICD-10-CM | POA: Diagnosis not present

## 2023-02-17 ENCOUNTER — Ambulatory Visit (INDEPENDENT_AMBULATORY_CARE_PROVIDER_SITE_OTHER): Payer: Medicare HMO | Admitting: Psychology

## 2023-02-17 DIAGNOSIS — F331 Major depressive disorder, recurrent, moderate: Secondary | ICD-10-CM | POA: Diagnosis not present

## 2023-02-17 NOTE — Progress Notes (Signed)
[facility] Counselor/Therapist Progress Note  Patient ID: [name], MRN: [id number],    Date: 02/17/2023  Time Spent: 11:00am-11:58am  pt is seen for an in person visit.     Treatment Type: Individual Therapy  Reported Symptoms: pt reports feeling "closed in", positive interactions w/ family, signed up to volunteer.    Mental Status Exam: Appearance:  Well Groomed     Behavior: Appropriate  Motor: Normal  Speech/Language:  Normal Rate  Affect: Appropriate  Mood: down  Thought process: normal  Thought content:   WNL  Sensory/Perceptual disturbances:   WNL  Orientation: oriented to person, place, time/date, and situation  Attention: Good  Concentration: Good  Memory: WNL  Fund of knowledge:  Good  Insight:   Good  Judgment:  Good  Impulse Control: Good   Risk Assessment: Danger to Self:  No Self-injurious Behavior: No Danger to Others: No Duty to Warn:no Physical Aggression / Violence:No  Access to Firearms a concern: No  Gang Involvement:No   Subjective: Counselor assessed pt current functioning per pt report. Processed w/pt mood and interactions. Explored w/ pt contributing factors to withdrawn and pt positive interactions she had enjoyed.  Provided psycho education re: benefit of engagement and ways to increased. Discussed behavioral activation and plan for increasing phone calls engage.   Pt affect wnl.  Pt reports she has been sick w/ sinus infections and heat makes not want to get out.  Pt identified feeing "closed in" and want to withdraw.  Pt insight that hasn't returned to her normal level of engagement  prepandemic.  Pt receptive to increased engagement and planning.  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   Interventions: Cognitive Behavioral Therapy and behavioral activation  Diagnosis:Major depressive disorder, recurrent episode, moderate (HCC)  Plan: pt to f/u in 4 weeks for counseling.  Pt to f/u as  scheduled w/ PCP.      Individualized Treatment Plan Strengths: enjoys time w/ daughter, son and w/ extended family and church community  Supports: her daughter   Goal/Needs for Treatment:  In order of importance to patient 1) engaged in activities/ talk w/ friends 2) decreased depressed moods 3) ---   Client Statement of Needs: "Get out of this mood I'm in. "   Treatment Level:out pt counseling  Symptoms:depressed mood, tearfulness, loss of interest, difficulty w/ motivation, withdrawn from interacting.   Client Treatment Preferences:biweekly in person counseling.  Continue medication management w/ PCP.    Healthcare consumer's goal for treatment:  Counselor, Forde Radon, [facility] will support the patient's ability to achieve the goals identified. Cognitive Behavioral Therapy, Assertive Communication/Conflict Resolution Training, Relaxation Training, ACT, Humanistic and other evidenced-based practices will be used to promote progress towards healthy functioning.   Healthcare consumer will: Actively participate in therapy, working towards healthy functioning.    *Justification for Continuation/Discontinuation of Goal: R=Revised, O=Ongoing, A=Achieved, D=Discontinued  Goal 1)  Increased engagement w/ activities she enjoys and w/ social interactions AEB pt report and counselor observation. Baseline date 12/02/22: Progress towards goal 0; How Often - Daily Target Date Goal Was reviewed Status Code Progress towards goal/Likert rating  12/02/23                Goal 2) Increased coping w/ losses and w/ depressed moods AEB utilizing coping skills daily.   Baseline date 12/02/22: Progress towards goal 0; How Often - Daily Target Date Goal Was reviewed Status Code Progress towards goal  12/02/23  This plan has been reviewed and created by the following participants:  This plan will be reviewed at least every 12 months. Date Behavioral Health Clinician Date Guardian/Patient    12/02/22  [facility] 12/02/22 Verbal Consent Provided                        Forde Radon [facility]

## 2023-02-18 ENCOUNTER — Telehealth: Payer: Self-pay | Admitting: *Deleted

## 2023-02-18 NOTE — Telephone Encounter (Signed)
Transition Care Management Follow-up Telephone Call Date of discharge and from where: [facility] 02/13/2023 How have you been since you were released from the hospital? Still not good the weather is involved  Any questions or concerns? No  Items Reviewed: Did the pt receive and understand the discharge instructions provided? Yes  Medications obtained and verified? No  Other? No  Any new allergies since your discharge? No  Dietary orders reviewed? No Do you have support at home? Yes      Follow up appointments reviewed:  PCP Hospital f/u appt confirmed? Patient will go if she needs not planned yet   Are transportation arrangements needed? No  If their condition worsens, is the pt aware to call PCP or go to the Emergency Dept.? Yes Was the patient provided with contact information for the PCP's office or ED? Yes Was to pt encouraged to call back with questions or concerns? Yes

## 2023-02-21 NOTE — Progress Notes (Unsigned)
HPI: [name] is a 77 y.o. female with PMHx significant for HTN,HLD,CAD,GERD,OSA,CKD III, CAD, and DM II here today for a follow-up visit after a recent urgent care visit on 7/14 for dizziness and sinus issues.  Having episodes of dizziness, states that she was Dx'ed with beginning of vertigo. Reports lightheadedness, feeling off balance More frequent when up and moving around, it is not like spinning sensation. She has not identified exacerbating or alleviating factors.  Also c/o change in hearing over the past few years. Negative for earache, drainage,or tinnitus.  Nasal Congestion and rhinorrhea. No facial pain or edema. Using Flonase nasal spray, which helps. Negative for fever or chills.  Head CT on 02/13/23:  1. No CT evidence for acute intracranial abnormality. 2. 6 mm fat density mass at the right CP angle likely reflecting a small lipoma, no change compared to prior.  She follows with neurologist for polyneuropathy. She has not noted new associated symptoms.She is on Gabapentin.  CKD III: Follows with "kidney" specialist, who retired. She is planning on establishing with new provider.  Lab Results  Component Value Date   NA 138 02/13/2023   CL 102 02/13/2023   K 3.5 02/13/2023   CO2 29 02/13/2023   BUN 19 02/13/2023   CREATININE 1.51 (H) 02/13/2023   GFRNONAA 35 (L) 02/13/2023   CALCIUM 9.7 02/13/2023   ALBUMIN 4.1 07/05/2022   GLUCOSE 69 (L) 02/13/2023   Negative for chest pain, dyspnea, palpitation, focal weakness, or worsening edema. She has not noted gross hematuria,foam in urine,or decreased urine output.  HTN on Losartan 100 mg daily, hydrochlorothiazide 25 mg daily, and metoprolol succinate 100 mg daily.  Anxiety and depression: Established with new psychotherapist. She is taking sertraline 50 mg daily, which helps. It was recommended to try to socialize more. She started attending church, used to be more involved but stopped doing so, lack of  motivation.  She was evaluated by ortho for right hip pain. Received a hip injection, which did not help. Has an appt with another provider this coming Thursday.  Upcoming appointments: - Mammogram next week .   Review of Systems  Constitutional:  Positive for fatigue. Negative for activity change and appetite change.  HENT:  Negative for sore throat and trouble swallowing.   Respiratory:  Negative for cough and wheezing.   Gastrointestinal:  Negative for abdominal pain, nausea and vomiting.  Endocrine: Negative for cold intolerance and heat intolerance.  Musculoskeletal:  Positive for arthralgias and back pain. Negative for gait problem.  Skin:  Negative for rash.  Allergic/Immunologic: Positive for environmental allergies.  Neurological:  Negative for syncope and facial asymmetry.  Psychiatric/Behavioral:  Negative for confusion and hallucinations. The patient is nervous/anxious.   See other pertinent positives and negatives in HPI.  Current Outpatient Medications on File Prior to Visit  Medication Sig Dispense Refill   acetaminophen (TYLENOL) 500 MG tablet Take 1,000 mg by mouth as needed for moderate pain or headache.      albuterol (VENTOLIN HFA) 108 (90 Base) MCG/ACT inhaler INHALE ONE OR TWO PUFFS BY MOUTH INTO THE LUNGS EVERY SIX HOURS AS NEEDED FOR WHEEZING OR SHORTNESS OF BREATH 8.5 g 12   aspirin 81 MG chewable tablet Chew 81 mg by mouth daily.     Budeson-Glycopyrrol-Formoterol (BREZTRI AEROSPHERE) 160-9-4.8 MCG/ACT AERO Inhale 2 puffs into the lungs in the morning and at bedtime. 2 each 11   cetirizine (ZYRTEC) 5 MG tablet Take 1 tablet (5 mg total) by mouth daily.  30 tablet 0   COVID-19 mRNA vaccine 2023-2024 (COMIRNATY) syringe Inject into the muscle. 0.3 mL 0   Evolocumab (REPATHA SURECLICK) 140 MG/ML SOAJ Inject 140 mg into the skin every 14 (fourteen) days. 2 mL 11   famotidine (PEPCID) 20 MG tablet TAKE ONE TABLET BY MOUTH EVERY MORNING and TAKE ONE TABLET BY MOUTH  EVERYDAY AT BEDTIME 180 tablet 2   fluticasone (FLONASE) 50 MCG/ACT nasal spray PLACE 2 SPRAYS INTO BOTH NOSTRILS AT BEDTIME AS NEEDED FOR ALLERGIES. 48 mL 1   gabapentin (NEURONTIN) 300 MG capsule TAKE ONE CAPSULE BY MOUTH AT BREAKFAST AND AT BEDTIME 60 capsule 3   hydrochlorothiazide (HYDRODIURIL) 25 MG tablet TAKE ONE TABLET BY MOUTH ONCE DAILY 90 tablet 2   losartan (COZAAR) 100 MG tablet TAKE ONE TABLET BY MOUTH EVERY MORNING 90 tablet 2   meclizine (ANTIVERT) 25 MG tablet Take 1 tablet (25 mg total) by mouth 3 (three) times daily as needed for dizziness. 30 tablet 0   metoprolol succinate (TOPROL-XL) 100 MG 24 hr tablet TAKE ONE TABLET BY MOUTH AT NOON 90 tablet 2   montelukast (SINGULAIR) 10 MG tablet TAKE ONE TABLET BY MOUTH EVERYDAY AT BEDTIME 30 tablet 3   MYRBETRIQ 50 MG TB24 tablet Take 50 mg by mouth daily.     nitroGLYCERIN (NITROSTAT) 0.4 MG SL tablet Place 1 tablet (0.4 mg total) under the tongue every 5 (five) minutes as needed for chest pain. 25 tablet 2   ondansetron (ZOFRAN-ODT) 4 MG disintegrating tablet 4mg  ODT q4 hours prn nausea/vomit 20 tablet 0   pantoprazole (PROTONIX) 40 MG tablet TAKE ONE TABLET BY MOUTH ONCE DAILY 90 tablet 3   rosuvastatin (CRESTOR) 10 MG tablet Take 1 tablet (10 mg total) by mouth daily. 90 tablet 3   sertraline (ZOLOFT) 50 MG tablet TAKE ONE TABLET BY MOUTH AT NOON 90 tablet 2   traMADol (ULTRAM) 50 MG tablet Take 1 tablet (50 mg total) by mouth every 6 (six) hours as needed. 20 tablet 0   triamcinolone cream (KENALOG) 0.1 % Apply 1 Application topically 3 times/day as needed-between meals & bedtime. 30 g 1   No current facility-administered medications on file prior to visit.    Past Medical History:  Diagnosis Date   Allergy    Anxiety    C. difficile diarrhea    history of   CAD (coronary artery disease), native coronary artery    a. 05/2017 showed 100% mLCx tx with DES, otherwise 20% mRCA, 50% ramus, 30% dLAD, 20% prox-mid LAD, 20% mLAD  treated medically.    Cancer [facility])    Right kidney CA removed.    CKD (chronic kidney disease) stage 3, GFR 30-59 ml/min (HCC) 10/11/2016   s/p R nephrectomy   Colon polyps 2008   HYPERPLASTIC   COPD (chronic obstructive pulmonary disease) (HCC)    former smoker   Gastritis    GERD (gastroesophageal reflux disease)    History of echocardiogram    Echo 3/18:  Moderate LVH, EF 60-65, grade 1 diastolic dysfunction, calcified aortic valve, mild MR, moderate LAE   History of nuclear stress test    Myoview 3/18: Mod size and intensity fixed septal defect, may be artifact.Opposite mod size and intensity lat defect, which is reversible and could represent ischemia or possibly artifact (SDS 4). LVEF 71% with normal wall motion. Intermediate risk study. >> images reviewed with Dr. Dietrich Pates - no sig ischemia; med rx    Hx of cardiovascular stress test  Lexiscan Myoview 6/16:  EF 70%, no scar or ischemia; Low Risk   Hyperlipidemia    Hypertension    Neuropathy    Orthostatic hypotension 05/28/2017   OSA (obstructive sleep apnea) 01/16/2021   Osteoarthritis    Pre-diabetes    no meds   Rheumatoid arthritis (HCC)    RA (Dr. Dareen Piano) Bilateral hands   S/P angioplasty with stent 05/27/17 to LCX with DES  05/28/2017   Sleep apnea 2023   does not use CPAP   Thyroid disease    Allergies  Allergen Reactions   Oxycodone-Acetaminophen Hives and Itching    hallucinations   Percocet [Oxycodone-Acetaminophen] Hives, Itching and Other (See Comments)    hallucinations   Pravastatin Sodium Other (See Comments)    Muscle aches Muscle aches   Hydrocodone Other (See Comments)    "crazy dreams"   Aspirin Other (See Comments)    GI upset Other reaction(s): Other (See Comments) REACTION: GI upset    Social History   Socioeconomic History   Marital status: Divorced    Spouse name: Not on file   Number of children: 3   Years of education: 12   Highest education level: Not on file   Occupational History   Occupation: Retired    Associate Professor: RETIRED  Tobacco Use   Smoking status: Former    Current packs/day: 0.00    Average packs/day: 0.2 packs/day for 53.1 years (10.6 ttl pk-yrs)    Types: Cigarettes    Start date: 23    Quit date: 09/07/2021    Years since quitting: 1.4   Smokeless tobacco: Never   Tobacco comments:    pt has been an on and off smoker since age 67  Vaping Use   Vaping status: Never Used  Substance and Sexual Activity   Alcohol use: No    Alcohol/week: 0.0 standard drinks of alcohol   Drug use: No   Sexual activity: Not Currently    Birth control/protection: Surgical    Comment: hyst  Other Topics Concern   Not on file  Social History Narrative   Single, dgtr lives with her   Current on/off smoker   Alcohol use- no   Drug use-no   Regular Exercise-yes   Social Determinants of Health   Financial Resource Strain: Low Risk  (09/08/2022)   Overall Financial Resource Strain (CARDIA)    Difficulty of Paying Living Expenses: Not hard at all  Food Insecurity: No Food Insecurity (10/20/2022)   Hunger Vital Sign    Worried About Running Out of Food in the Last Year: Never true    Ran Out of Food in the Last Year: Never true  Transportation Needs: No Transportation Needs (09/08/2022)   PRAPARE - Administrator, Civil Service (Medical): No    Lack of Transportation (Non-Medical): No  Physical Activity: Inactive (09/08/2022)   Exercise Vital Sign    Days of Exercise per Week: 0 days    Minutes of Exercise per Session: 0 min  Stress: Stress Concern Present (09/08/2022)   [employer] of Occupational Health - Occupational Stress Questionnaire    Feeling of Stress : To some extent  Social Connections: Moderately Integrated (09/08/2022)   Social Connection and Isolation Panel [NHANES]    Frequency of Communication with Friends and Family: More than three times a week    Frequency of Social Gatherings with Friends and Family: More than  three times a week    Attends Religious Services: More than 4 times per year  Active Member of Clubs or Organizations: Yes    Attends Banker Meetings: More than 4 times per year    Marital Status: Divorced    Vitals:   02/22/23 1459  BP: 130/70  Pulse: 64  Resp: 16  Temp: 98.1 F (36.7 C)  SpO2: 95%   Body mass index is 37.37 kg/m.  Physical Exam Vitals and nursing note reviewed.  Constitutional:      General: She is not in acute distress.    Appearance: She is well-developed.  HENT:     Head: Normocephalic and atraumatic.     Right Ear: External ear normal.     Left Ear: Tympanic membrane, ear canal and external ear normal.     Ears:     Comments: Excess cerumen right ear canal, TM seen partially.    Nose: No rhinorrhea.     Right Turbinates: Enlarged.     Left Turbinates: Enlarged.     Right Sinus: No maxillary sinus tenderness or frontal sinus tenderness.     Left Sinus: No maxillary sinus tenderness or frontal sinus tenderness.     Mouth/Throat:     Mouth: Mucous membranes are moist.     Pharynx: Oropharynx is clear.  Eyes:     Conjunctiva/sclera: Conjunctivae normal.  Cardiovascular:     Rate and Rhythm: Normal rate and regular rhythm.     Pulses:          Dorsalis pedis pulses are 2+ on the right side and 2+ on the left side.     Heart sounds: Murmur (Soft SEM LUSB) heard.  Pulmonary:     Effort: Pulmonary effort is normal. No respiratory distress.     Breath sounds: Normal breath sounds.  Abdominal:     Palpations: Abdomen is soft. There is no mass.     Tenderness: There is no abdominal tenderness.  Skin:    General: Skin is warm.     Findings: No erythema or rash.  Neurological:     General: No focal deficit present.     Mental Status: She is alert and oriented to person, place, and time.     Cranial Nerves: No cranial nerve deficit.     Gait: Gait normal.  Psychiatric:        Mood and Affect: Mood and affect normal.   ASSESSMENT  AND PLAN:  [name] was seen today for follow-up.  Diagnoses and all orders for this visit:  Bilateral hearing loss, unspecified hearing loss type Gradually getting worse. ? Conductive. Hearing Screening   500Hz  1000Hz  2000Hz  4000Hz   Right ear Fail Fail Pass Pass  Left ear Fail Fail Pass Pass   ENT evaluation may take a few months, so we will start with audiologist evaluation will be arranged for further testing.  -     Ambulatory referral to Audiology  Stage 3a chronic kidney disease (HCC) Assessment & Plan: Problem is stable, Cr 1.5-1.7 and e GFT 30's.. Continue adequate hydration, low-salt diet, and NSAIDs avoidance. Adequate BP and glucose control. Following with nephrologist. Continue Losartan.  Anxiety disorder, unspecified type Assessment & Plan: Otherwise stable. Continue Sertraline 50 mg daily and CBT.  Pain of right hip Has not improved with recent treatment, ? Steroid joint infection. Has appt with a different provider in 2 days.  Dizziness It has improved. We discussed possible etiologies, it does not seem to be vertigo. Some of her chronic co morbilities as well as medications can be contributing factors. Instructed to addressed problem  with neuro during her next visit. Instructed about warning signs.  Seasonal allergic rhinitis, unspecified trigger Assessment & Plan: We discussed Dx and prognosis. Flonase nasal spray daily as needed. Nasal saline irrigations through the days as needed.  I spent a total of 42 minutes in both face to face and non face to face activities for this visit on the date of this encounter. During this time history was obtained and documented, examination was performed, prior labs/imaging reviewed, and assessment/plan discussed.  Return if symptoms worsen or fail to improve, for keep next appointment.  Destynie Toomey G. [place], MD  [facility]. [place] office.

## 2023-02-22 ENCOUNTER — Ambulatory Visit (INDEPENDENT_AMBULATORY_CARE_PROVIDER_SITE_OTHER): Payer: Medicare HMO | Admitting: Family Medicine

## 2023-02-22 ENCOUNTER — Encounter: Payer: Self-pay | Admitting: Family Medicine

## 2023-02-22 VITALS — BP 130/70 | HR 64 | Temp 98.1°F | Resp 16 | Ht 66.0 in | Wt 231.5 lb

## 2023-02-22 DIAGNOSIS — J302 Other seasonal allergic rhinitis: Secondary | ICD-10-CM | POA: Diagnosis not present

## 2023-02-22 DIAGNOSIS — F419 Anxiety disorder, unspecified: Secondary | ICD-10-CM | POA: Diagnosis not present

## 2023-02-22 DIAGNOSIS — M25551 Pain in right hip: Secondary | ICD-10-CM | POA: Diagnosis not present

## 2023-02-22 DIAGNOSIS — H9193 Unspecified hearing loss, bilateral: Secondary | ICD-10-CM

## 2023-02-22 DIAGNOSIS — R42 Dizziness and giddiness: Secondary | ICD-10-CM

## 2023-02-22 DIAGNOSIS — N1831 Chronic kidney disease, stage 3a: Secondary | ICD-10-CM | POA: Diagnosis not present

## 2023-02-22 NOTE — Patient Instructions (Addendum)
A few things to remember from today's visit:  Stage 3a chronic kidney disease (HCC)  Anxiety disorder, unspecified type  Pain of right hip  Dizziness  Seasonal allergic rhinitis, unspecified trigger  Bilateral hearing loss, unspecified hearing loss type  Continue Flonase nasal spray daily as needed. Nasal saline irrigations as needed. We could arrange appt for hearing revaluation with audiologist. Be sure you have an appt with neurologist.   If you need refills for medications you take chronically, please call your pharmacy. Do not use My Chart to request refills or for acute issues that need immediate attention. If you send a my chart message, it may take a few days to be addressed, specially if I am not in the office.  Please be sure medication list is accurate. If a new problem present, please set up appointment sooner than planned today.

## 2023-02-24 DIAGNOSIS — M533 Sacrococcygeal disorders, not elsewhere classified: Secondary | ICD-10-CM | POA: Diagnosis not present

## 2023-02-24 DIAGNOSIS — M5416 Radiculopathy, lumbar region: Secondary | ICD-10-CM | POA: Diagnosis not present

## 2023-02-24 NOTE — Assessment & Plan Note (Signed)
We discussed Dx and prognosis. Flonase nasal spray daily as needed. Nasal saline irrigations through the days as needed.

## 2023-02-24 NOTE — Assessment & Plan Note (Signed)
Otherwise stable. Continue Sertraline 50 mg daily and CBT.

## 2023-02-24 NOTE — Assessment & Plan Note (Signed)
Problem is stable, Cr 1.5-1.7 and e GFT 30's.. Continue adequate hydration, low-salt diet, and NSAIDs avoidance. Adequate BP and glucose control. Following with nephrologist. Continue Losartan.

## 2023-03-02 DIAGNOSIS — Z1231 Encounter for screening mammogram for malignant neoplasm of breast: Secondary | ICD-10-CM | POA: Diagnosis not present

## 2023-03-02 LAB — HM MAMMOGRAPHY

## 2023-03-07 ENCOUNTER — Ambulatory Visit: Payer: Medicare HMO | Attending: Family Medicine | Admitting: Audiologist

## 2023-03-07 DIAGNOSIS — R262 Difficulty in walking, not elsewhere classified: Secondary | ICD-10-CM | POA: Diagnosis not present

## 2023-03-07 DIAGNOSIS — M5441 Lumbago with sciatica, right side: Secondary | ICD-10-CM | POA: Diagnosis not present

## 2023-03-07 DIAGNOSIS — H903 Sensorineural hearing loss, bilateral: Secondary | ICD-10-CM | POA: Insufficient documentation

## 2023-03-07 DIAGNOSIS — M6281 Muscle weakness (generalized): Secondary | ICD-10-CM | POA: Diagnosis not present

## 2023-03-07 DIAGNOSIS — G8929 Other chronic pain: Secondary | ICD-10-CM | POA: Insufficient documentation

## 2023-03-07 NOTE — Procedures (Signed)
  Outpatient Audiology and [facility] [address] [place], [place]  [address] [phone]  AUDIOLOGICAL  EVALUATION  NAME: SIDNI DREYFUSS     DOB:   [birth year]      MRN: [id number]                                                                                     DATE: 03/07/2023     REFERENT: [place], [name], MD STATUS: Outpatient DIAGNOSIS: Sensorineural Hearing Loss    History: Dyanara was seen for an audiological evaluation. Lizbette was accompanied to the appointment by her daughter. Brendia is receiving a hearing evaluation due to concerns declining hearing the last three years . Jaclene has difficulty hearing her son on the phone and in noise. This difficulty began gradually. No pain or pressure reported in either ear. Tinnitus intermittent in both ears. 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  Medical history positive for diabetes with neuropathy which is a risk factor for hearing loss. No other relevant case history reported.   Evaluation:  Otoscopy showed a clear view of the tympanic membranes, bilaterally Tympanometry results were consistent with normal middle ear function, bilaterally   Audiometric testing was completed using conventional audiometry with supraural transducer. Speech Recognition Thresholds were  36dB in the right ear and 30dB in the left ear. Word Recognition was performed 40dB SL, scored  100% in the right ear and 100% in the left ear. Pure tone thresholds show mild sensorineural hearing loss in each ear.   Results:  The test results were reviewed with Corrie Dandy and her sister. Astasia has a mild hearing loss. She will need people to face her to hear them clearly. Hearing aids not yet recommended, but Shakevia needs annual hearing tests. She will likely need hearing aids in the next few years.    Recommendations: 1.   Annual audiometric testing recommended.   29 minutes spent testing and counseling on results.   Ammie Ferrier   Audiologist, Au.D., CCC-A 03/07/2023  4:26 PM  Cc: [place], [name], MD

## 2023-03-16 ENCOUNTER — Other Ambulatory Visit: Payer: Self-pay | Admitting: Family Medicine

## 2023-03-17 ENCOUNTER — Ambulatory Visit: Payer: Medicare HMO | Admitting: Psychology

## 2023-03-17 DIAGNOSIS — F330 Major depressive disorder, recurrent, mild: Secondary | ICD-10-CM

## 2023-03-17 NOTE — Progress Notes (Signed)
[facility] Counselor/Therapist Progress Note  Patient ID: [name], MRN: [id number],    Date: 03/17/2023  Time Spent: 2:30pm-3:27pm  pt is seen for an in person visit.     Treatment Type: Individual Therapy  Reported Symptoms: pt reports positive interactions w/ family, f/u w/ talking w/ friends, low energy some days.      Mental Status Exam: Appearance:  Well Groomed     Behavior: Appropriate  Motor: Normal  Speech/Language:  Normal Rate  Affect: Appropriate  Mood: Some down days  Thought process: normal  Thought content:   WNL  Sensory/Perceptual disturbances:   WNL  Orientation: oriented to person, place, time/date, and situation  Attention: Good  Concentration: Good  Memory: WNL  Fund of knowledge:  Good  Insight:   Good  Judgment:  Good  Impulse Control: Good   Risk Assessment: Danger to Self:  No Self-injurious Behavior: No Danger to Others: No Duty to Warn:no Physical Aggression / Violence:No  Access to Firearms a concern: No  Gang Involvement:No   Subjective: Counselor assessed pt current functioning per pt report. Processed w/pt mood and f/u w/ engaging in interactions/activities. Explored positives of engagement and barriers.  Discussed her values and being consistent with and permission to not be 100% every day.  Pt affect wnl.  Pt reports she f/u w/ homework and called to friend and was a positive experience.  Pt reports things she is engaged with and feels good about.  Pt reports couple days a week doesn't feel like doing things and low energy.  Pt discussed not wanting to engage w/ some people and feeling that she "should".  Pt was able to reframe on interactions that are positive for her and focus there.  Pt reports on some stressors w/ both kids having some medical issues recent.    Interventions: Cognitive Behavioral Therapy and behavioral activation  Diagnosis:Mild episode of recurrent major depressive disorder (HCC)  Plan: pt to f/u  in 3 weeks for counseling.  Pt to f/u as scheduled w/ PCP.      Individualized Treatment Plan Strengths: enjoys time w/ daughter, son and w/ extended family and church community  Supports: her daughter   Goal/Needs for Treatment:  In order of importance to patient 1) engaged in activities/ talk w/ friends 2) decreased depressed moods 3) ---   Client Statement of Needs: "Get out of this mood I'm in. "   Treatment Level:out pt counseling  Symptoms:depressed mood, tearfulness, loss of interest, difficulty w/ motivation, withdrawn from interacting.   Client Treatment Preferences:biweekly in person counseling.  Continue medication management w/ PCP.    Healthcare consumer's goal for treatment:  Counselor, Forde Radon, [facility] will support the patient's ability to achieve the goals identified. Cognitive Behavioral Therapy, Assertive Communication/Conflict Resolution Training, Relaxation Training, ACT, Humanistic and other evidenced-based practices will be used to promote progress towards healthy functioning.   Healthcare consumer will: Actively participate in therapy, working towards healthy functioning.    *Justification for Continuation/Discontinuation of Goal: R=Revised, O=Ongoing, A=Achieved, D=Discontinued  Goal 1)  Increased engagement w/ activities she enjoys and w/ social interactions AEB pt report and counselor observation. Baseline date 12/02/22: Progress towards goal 0; How Often - Daily Target Date Goal Was reviewed Status Code Progress towards goal/Likert rating  12/02/23                Goal 2) Increased coping w/ losses and w/ depressed moods AEB utilizing coping skills daily.   Baseline date 12/02/22: Progress towards  goal 0; How Often - Daily Target Date Goal Was reviewed Status Code Progress towards goal  12/02/23                  This plan has been reviewed and created by the following participants:  This plan will be reviewed at least every 12 months. Date Behavioral  Health Clinician Date Guardian/Patient   12/02/22  [facility] Ophelia Charter [facility] 12/02/22 Verbal Consent Provided                              Forde Radon [facility]

## 2023-03-23 NOTE — Therapy (Signed)
OUTPATIENT PHYSICAL THERAPY THORACOLUMBAR EVALUATION   Patient Name: [name] MRN: [id number] DOB:1945-12-18, 77 y.o., female Today's Date: 03/24/2023  END OF SESSION:  PT End of Session - 03/24/23 1432     Visit Number 1    Number of Visits 13    Date for PT Re-Evaluation 05/13/23    Authorization Type [employer] MEDICARE HMO/PPO    PT Start Time 1420    PT Stop Time 1503    PT Time Calculation (min) 43 min    Activity Tolerance Patient tolerated treatment well    Behavior During Therapy WFL for tasks assessed/performed             Past Medical History:  Diagnosis Date   Allergy    Anxiety    C. difficile diarrhea    history of   CAD (coronary artery disease), native coronary artery    a. 05/2017 showed 100% mLCx tx with DES, otherwise 20% mRCA, 50% ramus, 30% dLAD, 20% prox-mid LAD, 20% mLAD treated medically.    Cancer [facility])    Right kidney CA removed.    CKD (chronic kidney disease) stage 3, GFR 30-59 ml/min (HCC) 10/11/2016   s/p R nephrectomy   Colon polyps 2008   HYPERPLASTIC   COPD (chronic obstructive pulmonary disease) (HCC)    former smoker   Gastritis    GERD (gastroesophageal reflux disease)    History of echocardiogram    Echo 3/18:  Moderate LVH, EF 60-65, grade 1 diastolic dysfunction, calcified aortic valve, mild MR, moderate LAE   History of nuclear stress test    Myoview 3/18: Mod size and intensity fixed septal defect, may be artifact.Opposite mod size and intensity lat defect, which is reversible and could represent ischemia or possibly artifact (SDS 4). LVEF 71% with normal wall motion. Intermediate risk study. >> images reviewed with Dr. Dietrich Pates - no sig ischemia; med rx    Hx of cardiovascular stress test    Lexiscan Myoview 6/16:  EF 70%, no scar or ischemia; Low Risk   Hyperlipidemia    Hypertension    Neuropathy    Orthostatic hypotension 05/28/2017   OSA (obstructive sleep apnea) 01/16/2021   Osteoarthritis    Pre-diabetes    no  meds   Rheumatoid arthritis (HCC)    RA (Dr. Dareen Blackwell) Bilateral hands   S/P angioplasty with stent 05/27/17 to LCX with DES  05/28/2017   Sleep apnea 2023   does not use CPAP   Thyroid disease    Past Surgical History:  Procedure Laterality Date   ABDOMINAL HYSTERECTOMY  1978   ANTERIOR CERVICAL DECOMP/DISCECTOMY FUSION N/A 05/29/2018   Procedure: ANTERIOR CERVICAL DECOMPRESSION FUSION - CERVICAL FIVE-CERVICAL SIX - CERVICAL SIX-CERVICAL SEVEN;  Surgeon: Rose Sicks, MD;  Location: [facility] OR;  Service: Neurosurgery;  Laterality: N/A;   BACK SURGERY     x 2   BREAST LUMPECTOMY WITH RADIOACTIVE SEED LOCALIZATION Right 04/13/2022   Procedure: RIGHT BREAST SEED LUMPECTOMY;  Surgeon: Rose Bouillon, MD;  Location: [facility];  Service: General;  Laterality: Right;   CARPAL TUNNEL RELEASE Left    COLONOSCOPY W/ BIOPSIES AND POLYPECTOMY     Hx: of   CORONARY STENT INTERVENTION N/A 05/27/2017   Procedure: CORONARY STENT INTERVENTION;  Surgeon: Rose Hazel, MD;  Location: [facility] INVASIVE CV LAB;  Service: Cardiovascular;  Laterality: N/A;   ESOPHAGOGASTRODUODENOSCOPY     HNP     LEFT HEART CATH AND CORONARY ANGIOGRAPHY N/A 05/27/2017   Procedure: LEFT  HEART CATH AND CORONARY ANGIOGRAPHY;  Surgeon: Rose Hazel, MD;  Location: [facility];  Service: Cardiovascular;  Laterality: N/A;   LUMBAR LAMINECTOMY/DECOMPRESSION MICRODISCECTOMY Left 02/12/2013   Procedure: LUMBAR TWO THREE, LUMBAR THREE FOUR, LUMBAR FOUR FIVE  LAMINECTOMY/DECOMPRESSION MICRODISCECTOMY 3 LEVELS;  Surgeon: Rose Pacini, MD;  Location: [facility] NEURO ORS;  Service: Neurosurgery;  Laterality: Left;   NEPHRECTOMY Right 2010   10.rcc cancer   TOTAL KNEE ARTHROPLASTY Right    Redo   Patient Active Problem List   Diagnosis Date Noted   Local reaction to COVID-19 vaccine 10/12/2022   Moderate episode of recurrent major depressive disorder (HCC) 03/24/2022   PAD (peripheral artery disease) (HCC)  11/28/2021   OSA (obstructive sleep apnea) 01/16/2021   COPD mixed type (HCC) 01/16/2021   Fatigue 11/03/2020   B12 deficiency 11/03/2020   Chest pain 08/30/2020   Malignant neoplasm of kidney excluding renal pelvis, unspecified laterality (HCC) 09/26/2019   Vitamin D deficiency, unspecified 09/26/2019   Anxiety disorder, unspecified 03/26/2019   Tobacco use disorder 09/29/2018   Cervical myelopathy (HCC) 05/29/2018   ANA positive 12/08/2017   Cervicalgia 09/16/2017   Carpal tunnel syndrome of left wrist 07/21/2017   Chondrocalcinosis 07/21/2017   Neuropathy 07/21/2017   Orthostatic hypotension 05/28/2017   S/P angioplasty with stent 05/27/17 to LCX with DES  05/28/2017   CAD (coronary artery disease)    Polyneuropathy 05/25/2017   Chest pain with moderate risk for cardiac etiology 05/25/2017   Class 1 obesity with serious comorbidity and body mass index (BMI) of 34.0 to 34.9 in adult 12/06/2016   CKD (chronic kidney disease) stage 3, GFR 30-59 ml/min (HCC) 10/11/2016   Hot flashes, menopausal 10/01/2016   Chest pain with moderate risk of acute coronary syndrome 10/25/2014   Degenerative spondylolisthesis 10/15/2014   Spinal stenosis, lumbar region, with neurogenic claudication 02/12/2013   DM (diabetes mellitus), type 2 with renal complications (HCC) 03/05/2010   History of Rtr nephrectomy 2010, secondary to renal cell cancer 01/14/2009   PERSONAL HX COLONIC POLYPS 11/14/2008   Hyperlipidemia associated with type 2 diabetes mellitus (HCC) 11/16/2007   ESOPHAGEAL STRICTURE 10/12/2007   Hypertension with heart disease 01/19/2007   Allergic rhinitis 01/19/2007   GERD 01/19/2007    PCP: [place], [name], MD  REFERRING PROVIDER: Floreen Comber, NP  REFERRING DIAG: M54.16 (ICD-10-CM) - Radiculopathy, lumbar region   Rationale for Evaluation and Treatment: Rehabilitation  THERAPY DIAG:  Chronic right-sided low back pain with right-sided sciatica  Muscle weakness  (generalized)  Difficulty in walking, not elsewhere classified  ONSET DATE: Chronic   SUBJECTIVE:  SUBJECTIVE STATEMENT: Pt reports a chronic Hx of low back R>L and R hip pain which has been progressively getting worse since April of this year. She notes injections in the past have been helpful with decreasing her pain, however her last one did not help decrease the pain since.  PERTINENT HISTORY:  Anxiety, CAD, COPD, HTN, neuropathy, OA, Hx of multiple back surgeries, R TKR  PAIN:  Are you having pain? Yes: NPRS scale: 8/10 Pain location: low back and R hip, constant Pain description: ache Aggravating factors: Standing, walking, prolonged sitting  Relieving factors: lying down  PRECAUTIONS: None  RED FLAGS: None   WEIGHT BEARING RESTRICTIONS: No  FALLS:  Has patient fallen in last 6 months? No  LIVING ENVIRONMENT: Lives with: lives with their family Lives in: House/apartment Stairs: Yes: External: 2 steps; on left going up Has following equipment at home: Single point cane  OCCUPATION: Retired  PLOF: Independent  PATIENT GOALS: To feel better  NEXT MD VISIT: To call to schedule her next appt  OBJECTIVE:   DIAGNOSTIC FINDINGS:  X-rays demonstrate advanced osteoarthritis with significant joint space narrowing.  Large superior acetabular osteophyte                       06/09/20 DG Lumbar Spine FINDINGS: No evidence of fracture. Vertebral body heights are maintained. No substantial subluxation. L4-L5 fusion with intervening interbody spacer. No evidence of hardware complication. Moderate degenerative disc disease at L2-L3, L3-L4, and L5-S1. Milder degenerative disc disease at the other levels. Multilevel facet arthropathy, greatest in the lower lumbar spine.   IMPRESSION: No  radiographic evidence of acute fracture or traumatic malalignment. CT could provide more sensitive evaluation if clinically indicated.  PATIENT SURVEYS:  FOTO: Perceived function   30%, predicted   39%   SCREENING FOR RED FLAGS: Bowel or bladder incontinence: No Spinal tumors: No Cauda equina syndrome: No Compression fracture: No  COGNITION: Overall cognitive status: Within functional limits for tasks assessed     SENSATION: WFL  MUSCLE LENGTH: Hamstrings: Right WNLs; Left WNLs Thomas test: Right tight ; Left tight   POSTURE: rounded shoulders, forward head, anterior pelvic tilt, and flexed trunk   PALPATION: TTP R paraspinals, PSIS, and gluteal muscles  LUMBAR ROM:   AROM eval  Flexion Mod limited to mid shin; pulling pain   Extension Markedly limited; pressure pain R  Right lateral flexion Mod limited to above knee jt line; pressure pain R  Left lateral flexion Mod limited to above knee jt line; pulling pain R  Right rotation Min limited; pulling pain R  Left rotation Min limited; pulling pain R   (Blank rows = not tested)  LOWER EXTREMITY ROM:     Passive  Right eval Left eval  Hip flexion Limited pain Limited  Hip extension    Hip abduction    Hip adduction painful   Hip internal rotation Limited; pain Limited  Hip external rotation    Knee flexion    Knee extension    Ankle dorsiflexion    Ankle plantarflexion    Ankle inversion    Ankle eversion     (Blank rows = not tested)  LOWER EXTREMITY MMT:    Weak core MMT Right eval Left eval  Hip flexion 4 pain 4+  Hip extension 4 pain 4+  Hip abduction 4 Pain 4+  Hip adduction    Hip internal rotation    Hip external rotation 4 pain 4+  Knee flexion  Knee extension    Ankle dorsiflexion    Ankle plantarflexion    Ankle inversion    Ankle eversion     (Blank rows = not tested)  LUMBAR SPECIAL TESTS:  Straight leg raise test: Negative, SI Compression/distraction test: Negative, and FABER  test: Positive  HIP SPECIAL TESTS;   Scour test: Positive  Distraction test: Positive  FUNCTIONAL TESTS:  2 MWT: TBA  GAIT: Distance walked: 258ft Assistive device utilized: Single point cane Level of assistance: Modified independence Comments: Decreased pace  TODAY'S TREATMENT:    Eval only                                                                                                                            PATIENT EDUCATION:  Education details: Eval findings, POC  Person educated: Patient Education method: Explanation Education comprehension: verbalized understanding  HOME EXERCISE PROGRAM: TBD  ASSESSMENT:  CLINICAL IMPRESSION: Patient is a 77 y.o. female who was seen today for physical therapy evaluation and treatment for M54.16 (ICD-10-CM) - Radiculopathy, lumbar region. Pt presents to PT with both low back and R hip pain. With today's PT assessment and pt's symptoms, it appears the pt has musculoskeletal issues of both areas. Pt has a Hx of lumbar surgeries including fusions, laminectomies, and microdiscectomy. The PT eval revealed decreased trunk and hip ROM, and decrease coe and hip strength. Pt will continue to benefit from skilled PT 2w6 to address impairments for improved function with minimized pain.  OBJECTIVE IMPAIRMENTS: decreased activity tolerance, decreased balance, difficulty walking, decreased ROM, decreased strength, increased muscle spasms, postural dysfunction, obesity, and pain.   ACTIVITY LIMITATIONS: carrying, lifting, bending, sitting, standing, squatting, sleeping, stairs, and locomotion level  PARTICIPATION LIMITATIONS: meal prep, cleaning, laundry, and shopping  PERSONAL FACTORS: Age, Fitness, Past/current experiences, Time since onset of injury/illness/exacerbation, and 1-2 comorbidities:   neuropathy, OA, Hx of multiple back surgeries, R TKR are also affecting patient's functional outcome.   REHAB POTENTIAL: Good  CLINICAL DECISION  MAKING: Evolving/moderate complexity  EVALUATION COMPLEXITY: Moderate   GOALS:  SHORT TERM GOALS: Target date: 04/15/23  Pt will be Ind in an initial HEP  Baseline: TBD Goal status: INITIAL  2.  Pt will voice understanding of measures to assist in pain reduction  Baseline:  Goal status: INITIAL   LONG TERM GOALS: Target date: 05/13/23  Pt will be Ind in a final HEP to maintain achieved LOF Baseline:  Goal status: INITIAL  2.  Increase hip strength to 4+/5 bilat for improved trunk support Baseline: see flow sheets Goal status: INITIAL  3.  Pt will be able to complete a bridge for 60 sec as demonstration of improved hip and trunk strenght Baseline: NT Goal status: INITIAL  4.  Pt's FOTO score will improved to the predicted value of 39% as indication of improved function  Baseline: 30% Goal status: INITIAL  5.  Improve by MCID of 108ft as indication of improved functional mobility  Baseline:  TBA Goal status: INITIAL  PLAN:  PT FREQUENCY: 2x/week  PT DURATION: 6 weeks  PLANNED INTERVENTIONS: Therapeutic exercises, Therapeutic activity, Balance training, Gait training, Patient/Family education, Self Care, Joint mobilization, Stair training, Aquatic Therapy, Dry Needling, Spinal mobilization, Cryotherapy, Moist heat, Taping, Ultrasound, Ionotophoresis 4mg /ml Dexamethasone, Manual therapy, and Re-evaluation.  PLAN FOR NEXT SESSION: Assess ; Review FOTO; assess response to HEP; progress therex as indicated; use of modalities, manual therapy; and TPDN as indicated.  Kemond Amorin MS, PT 03/25/23 2:45 PM

## 2023-03-24 ENCOUNTER — Observation Stay (HOSPITAL_COMMUNITY)
Admission: EM | Admit: 2023-03-24 | Discharge: 2023-03-25 | Disposition: A | Discharge status: Home / Self Care | Payer: Medicare HMO | Attending: Internal Medicine | Admitting: Internal Medicine

## 2023-03-24 ENCOUNTER — Encounter (HOSPITAL_COMMUNITY): Payer: Self-pay | Admitting: Emergency Medicine

## 2023-03-24 ENCOUNTER — Other Ambulatory Visit: Payer: Self-pay

## 2023-03-24 ENCOUNTER — Ambulatory Visit: Payer: Medicare HMO

## 2023-03-24 ENCOUNTER — Emergency Department (HOSPITAL_COMMUNITY): Payer: Medicare HMO

## 2023-03-24 DIAGNOSIS — R339 Retention of urine, unspecified: Secondary | ICD-10-CM | POA: Diagnosis not present

## 2023-03-24 DIAGNOSIS — Z87891 Personal history of nicotine dependence: Secondary | ICD-10-CM | POA: Insufficient documentation

## 2023-03-24 DIAGNOSIS — M6281 Muscle weakness (generalized): Secondary | ICD-10-CM

## 2023-03-24 DIAGNOSIS — Z8509 Personal history of malignant neoplasm of other digestive organs: Secondary | ICD-10-CM | POA: Insufficient documentation

## 2023-03-24 DIAGNOSIS — N1832 Chronic kidney disease, stage 3b: Secondary | ICD-10-CM | POA: Insufficient documentation

## 2023-03-24 DIAGNOSIS — I129 Hypertensive chronic kidney disease with stage 1 through stage 4 chronic kidney disease, or unspecified chronic kidney disease: Secondary | ICD-10-CM | POA: Insufficient documentation

## 2023-03-24 DIAGNOSIS — J45909 Unspecified asthma, uncomplicated: Secondary | ICD-10-CM | POA: Insufficient documentation

## 2023-03-24 DIAGNOSIS — Z7982 Long term (current) use of aspirin: Secondary | ICD-10-CM | POA: Diagnosis not present

## 2023-03-24 DIAGNOSIS — M5441 Lumbago with sciatica, right side: Secondary | ICD-10-CM | POA: Diagnosis not present

## 2023-03-24 DIAGNOSIS — Z79899 Other long term (current) drug therapy: Secondary | ICD-10-CM | POA: Diagnosis not present

## 2023-03-24 DIAGNOSIS — Z955 Presence of coronary angioplasty implant and graft: Secondary | ICD-10-CM | POA: Diagnosis not present

## 2023-03-24 DIAGNOSIS — I2510 Atherosclerotic heart disease of native coronary artery without angina pectoris: Secondary | ICD-10-CM | POA: Insufficient documentation

## 2023-03-24 DIAGNOSIS — J449 Chronic obstructive pulmonary disease, unspecified: Secondary | ICD-10-CM | POA: Diagnosis not present

## 2023-03-24 DIAGNOSIS — G8929 Other chronic pain: Secondary | ICD-10-CM | POA: Diagnosis not present

## 2023-03-24 DIAGNOSIS — R079 Chest pain, unspecified: Principal | ICD-10-CM | POA: Diagnosis present

## 2023-03-24 DIAGNOSIS — R262 Difficulty in walking, not elsewhere classified: Secondary | ICD-10-CM

## 2023-03-24 DIAGNOSIS — H903 Sensorineural hearing loss, bilateral: Secondary | ICD-10-CM | POA: Diagnosis not present

## 2023-03-24 DIAGNOSIS — Z96651 Presence of right artificial knee joint: Secondary | ICD-10-CM | POA: Insufficient documentation

## 2023-03-24 DIAGNOSIS — R0789 Other chest pain: Secondary | ICD-10-CM | POA: Diagnosis not present

## 2023-03-24 LAB — CBC
HCT: 44.2 % (ref 36.0–46.0)
Hemoglobin: 14.3 g/dL (ref 12.0–15.0)
MCH: 28.8 pg (ref 26.0–34.0)
MCHC: 32.4 g/dL (ref 30.0–36.0)
MCV: 88.9 fL (ref 80.0–100.0)
Platelets: 261 10*3/uL (ref 150–400)
RBC: 4.97 MIL/uL (ref 3.87–5.11)
RDW: 15.9 % — ABNORMAL HIGH (ref 11.5–15.5)
WBC: 8.8 10*3/uL (ref 4.0–10.5)
nRBC: 0 % (ref 0.0–0.2)

## 2023-03-24 LAB — BASIC METABOLIC PANEL
Anion gap: 15 (ref 5–15)
BUN: 22 mg/dL (ref 8–23)
CO2: 24 mmol/L (ref 22–32)
Calcium: 9.7 mg/dL (ref 8.9–10.3)
Chloride: 100 mmol/L (ref 98–111)
Creatinine, Ser: 1.65 mg/dL — ABNORMAL HIGH (ref 0.44–1.00)
GFR, Estimated: 31 mL/min — ABNORMAL LOW (ref >60)
Glucose, Bld: 163 mg/dL — ABNORMAL HIGH (ref 70–99)
Potassium: 3.5 mmol/L (ref 3.5–5.1)
Sodium: 139 mmol/L (ref 135–145)

## 2023-03-24 LAB — TROPONIN I (HIGH SENSITIVITY)
Troponin I (High Sensitivity): 4 ng/L (ref <18)
Troponin I (High Sensitivity): 3 ng/L (ref <18)

## 2023-03-24 MED ORDER — ASPIRIN 81 MG PO CHEW
243.0000 mg | CHEWABLE_TABLET | Freq: Once | ORAL | Status: AC
  Administered 2023-03-24: 243 mg via ORAL
  Filled 2023-03-24: qty 3

## 2023-03-24 NOTE — ED Notes (Signed)
ED TO INPATIENT HANDOFF REPORT  ED Nurse Name and Phone #: [phone]  S Name/Age/Gender [name] 77 y.o. female Room/Bed: 016C/016C  Code Status   Code Status: Prior  Home/SNF/Other Home Patient oriented to: self, place, time, and situation Is this baseline? Yes   Triage Complete: Triage complete  Chief Complaint Chest pain [R07.9]  Triage Note Pt presents with nagging crampy chest pain on the left, non radiating, that woke her from sleep this morning.  Has had shob. No n/v/d.  Reports hx of MI with stent placement in the past.    Allergies Allergies  Allergen Reactions   Oxycodone-Acetaminophen Hives and Itching    hallucinations   Percocet [Oxycodone-Acetaminophen] Hives, Itching and Other (See Comments)    hallucinations   Pravastatin Sodium Other (See Comments)    Muscle aches Muscle aches   Hydrocodone Other (See Comments)    "crazy dreams"   Aspirin Other (See Comments)    GI upset Other reaction(s): Other (See Comments) REACTION: GI upset    Level of Care/Admitting Diagnosis ED Disposition     ED Disposition  Admit   Condition  --   Comment  Hospital Area: [facility] [[id number]]  Level of Care: Telemetry Cardiac [[id number]]  May place patient in observation at [facility] or [facility] if equivalent level of care is available:: Yes  Covid Evaluation: Asymptomatic - no recent exposure (last 10 days) testing not required  Diagnosis: Chest pain [454098]  Admitting Physician: Alan Mulder [[id number]]  Attending Physician: Alan Mulder [[id number]]          B Medical/Surgery History Past Medical History:  Diagnosis Date   Allergy    Anxiety    C. difficile diarrhea    history of   CAD (coronary artery disease), native coronary artery    a. 05/2017 showed 100% mLCx tx with DES, otherwise 20% mRCA, 50% ramus, 30% dLAD, 20% prox-mid LAD, 20% mLAD treated medically.    Cancer [facility])    Right kidney CA removed.    CKD (chronic kidney  disease) stage 3, GFR 30-59 ml/min (HCC) 10/11/2016   s/p R nephrectomy   Colon polyps 2008   HYPERPLASTIC   COPD (chronic obstructive pulmonary disease) (HCC)    former smoker   Gastritis    GERD (gastroesophageal reflux disease)    History of echocardiogram    Echo 3/18:  Moderate LVH, EF 60-65, grade 1 diastolic dysfunction, calcified aortic valve, mild MR, moderate LAE   History of nuclear stress test    Myoview 3/18: Mod size and intensity fixed septal defect, may be artifact.Opposite mod size and intensity lat defect, which is reversible and could represent ischemia or possibly artifact (SDS 4). LVEF 71% with normal wall motion. Intermediate risk study. >> images reviewed with Dr. Dietrich Pates - no sig ischemia; med rx    Hx of cardiovascular stress test    Lexiscan Myoview 6/16:  EF 70%, no scar or ischemia; Low Risk   Hyperlipidemia    Hypertension    Neuropathy    Orthostatic hypotension 05/28/2017   OSA (obstructive sleep apnea) 01/16/2021   Osteoarthritis    Pre-diabetes    no meds   Rheumatoid arthritis (HCC)    RA (Dr. Dareen Piano) Bilateral hands   S/P angioplasty with stent 05/27/17 to LCX with DES  05/28/2017   Sleep apnea 2023   does not use CPAP   Thyroid disease    Past Surgical History:  Procedure Laterality Date   ABDOMINAL  HYSTERECTOMY  1978   ANTERIOR CERVICAL DECOMP/DISCECTOMY FUSION N/A 05/29/2018   Procedure: ANTERIOR CERVICAL DECOMPRESSION FUSION - CERVICAL FIVE-CERVICAL SIX - CERVICAL SIX-CERVICAL SEVEN;  Surgeon: Julio Sicks, MD;  Location: [facility] OR;  Service: Neurosurgery;  Laterality: N/A;   BACK SURGERY     x 2   BREAST LUMPECTOMY WITH RADIOACTIVE SEED LOCALIZATION Right 04/13/2022   Procedure: RIGHT BREAST SEED LUMPECTOMY;  Surgeon: Harriette Bouillon, MD;  Location: [facility];  Service: General;  Laterality: Right;   CARPAL TUNNEL RELEASE Left    COLONOSCOPY W/ BIOPSIES AND POLYPECTOMY     Hx: of   CORONARY STENT INTERVENTION N/A  05/27/2017   Procedure: CORONARY STENT INTERVENTION;  Surgeon: Kathleene Hazel, MD;  Location: [facility] INVASIVE CV LAB;  Service: Cardiovascular;  Laterality: N/A;   ESOPHAGOGASTRODUODENOSCOPY     HNP     LEFT HEART CATH AND CORONARY ANGIOGRAPHY N/A 05/27/2017   Procedure: LEFT HEART CATH AND CORONARY ANGIOGRAPHY;  Surgeon: Kathleene Hazel, MD;  Location: [facility] INVASIVE CV LAB;  Service: Cardiovascular;  Laterality: N/A;   LUMBAR LAMINECTOMY/DECOMPRESSION MICRODISCECTOMY Left 02/12/2013   Procedure: LUMBAR TWO THREE, LUMBAR THREE FOUR, LUMBAR FOUR FIVE  LAMINECTOMY/DECOMPRESSION MICRODISCECTOMY 3 LEVELS;  Surgeon: Temple Pacini, MD;  Location: [facility] NEURO ORS;  Service: Neurosurgery;  Laterality: Left;   NEPHRECTOMY Right 2010   10.rcc cancer   TOTAL KNEE ARTHROPLASTY Right    Redo     A IV Location/Drains/Wounds Patient Lines/Drains/Airways Status     Active Line/Drains/Airways     None            Intake/Output Last 24 hours No intake or output data in the 24 hours ending 03/24/23 2254  Labs/Imaging Results for orders placed or performed during the hospital encounter of 03/24/23 (from the past 48 hour(s))  Basic metabolic panel     Status: Abnormal   Collection Time: 03/24/23  7:37 PM  Result Value Ref Range   Sodium 139 135 - 145 mmol/L   Potassium 3.5 3.5 - 5.1 mmol/L   Chloride 100 98 - 111 mmol/L   CO2 24 22 - 32 mmol/L   Glucose, Bld 163 (H) 70 - 99 mg/dL    Comment: Glucose reference range applies only to samples taken after fasting for at least 8 hours.   BUN 22 8 - 23 mg/dL   Creatinine, Ser 1.61 (H) 0.44 - 1.00 mg/dL   Calcium 9.7 8.9 - 09.6 mg/dL   GFR, Estimated 31 (L) >60 mL/min    Comment: (NOTE) Calculated using the CKD-EPI Creatinine Equation (2021)    Anion gap 15 5 - 15    Comment: Performed at [facility], [address], [place], [place] [address]  CBC     Status: Abnormal   Collection Time: 03/24/23  7:37 PM  Result Value Ref Range    WBC 8.8 4.0 - 10.5 K/uL   RBC 4.97 3.87 - 5.11 MIL/uL   Hemoglobin 14.3 12.0 - 15.0 g/dL   HCT 98.1 19.1 - 47.8 %   MCV 88.9 80.0 - 100.0 fL   MCH 28.8 26.0 - 34.0 pg   MCHC 32.4 30.0 - 36.0 g/dL   RDW 29.5 (H) 62.1 - 30.8 %   Platelets 261 150 - 400 K/uL   nRBC 0.0 0.0 - 0.2 %    Comment: Performed at [facility], [address] [place], [place] [address]  Troponin I (High Sensitivity)     Status: None  Collection Time: 03/24/23  7:37 PM  Result Value Ref Range   Troponin I (High Sensitivity) 4 <18 ng/L    Comment: (NOTE) Elevated high sensitivity troponin I (hsTnI) values and significant  changes across serial measurements may suggest ACS but many other  chronic and acute conditions are known to elevate hsTnI results.  Refer to the "Links" section for chest pain algorithms and additional  guidance. Performed at [facility], [address] [place], [place] [address]   Troponin I (High Sensitivity)     Status: None   Collection Time: 03/24/23  9:51 PM  Result Value Ref Range   Troponin I (High Sensitivity) 3 <18 ng/L    Comment: (NOTE) Elevated high sensitivity troponin I (hsTnI) values and significant  changes across serial measurements may suggest ACS but many other  chronic and acute conditions are known to elevate hsTnI results.  Refer to the "Links" section for chest pain algorithms and additional  guidance. Performed at [facility], [address], [place], [place] [address]    *Note: Due to a large number of results and/or encounters for the requested time period, some results have not been displayed. A complete set of results can be found in Results Review.   DG Chest 2 View  Result Date: 03/24/2023 CLINICAL DATA:  Chest pain. EXAM: CHEST - 2 VIEW COMPARISON:  Chest radiographs 06/14/2022. FINDINGS: Clear lungs. Stable cardiac and mediastinal contours. No pleural effusion or pneumothorax. Visualized bones and upper abdomen are unremarkable.  IMPRESSION: No evidence of acute cardiopulmonary disease. Electronically Signed   By: Orvan Falconer M.D.   On: 03/24/2023 21:53    Pending Labs Unresulted Labs (From admission, onward)    None       Vitals/Pain Today's Vitals   03/24/23 1924 03/24/23 1931  BP: 129/69   Pulse: 73   Resp: 18   Temp: 97.8 F (36.6 C)   SpO2: 95%   PainSc:  4     Isolation Precautions No active isolations  Medications Medications  aspirin chewable tablet 243 mg (243 mg Oral Given 03/24/23 2143)    Mobility walks     Focused Assessments    R Recommendations: See Admitting Provider Note  Report given to:   Additional Notes:

## 2023-03-24 NOTE — ED Provider Notes (Signed)
[facility] AT [facility] Provider Note   CSN: [id number] Arrival date & time: 03/24/23  4098     History  Chief Complaint  Patient presents with   Chest Pain    [name] is a 77 y.o. female.  Patient is a 77 year old female with past medical history of CAD status post DES to left circumflex, hypertension, hyperlipidemia, diabetes, CKD status post right nephrectomy, RA, and OSA presenting today for chest pain.  She states began this morning, awakening from sleep.  She describes it as a left-sided nagging pain.  It went away without intervention after few minutes.  She describes associated shortness of breath but denies any nausea or diaphoresis.  She does states she has had worsening shortness of breath over the last few days.  It is exertional in nature.  She denies any fevers, chills, or cough.  She has had no hemoptysis.  He denies any known recent sick contacts.  She has no history of blood clots.  She has no recent long trips or hospitalizations.  She is not on any estrogen medications.     Home Medications Prior to Admission medications   Medication Sig Start Date End Date Taking? Authorizing Provider  acetaminophen (TYLENOL) 500 MG tablet Take 1,000 mg by mouth as needed for moderate pain or headache.     [provider]  albuterol (VENTOLIN HFA) 108 (90 Base) MCG/ACT inhaler INHALE ONE OR TWO PUFFS BY MOUTH INTO THE LUNGS EVERY SIX HOURS AS NEEDED FOR WHEEZING OR SHORTNESS OF BREATH 04/20/22   Jetty Duhamel D, MD  aspirin 81 MG chewable tablet Chew 81 mg by mouth daily.    [provider]  Budeson-Glycopyrrol-Formoterol (BREZTRI AEROSPHERE) 160-9-4.8 MCG/ACT AERO Inhale 2 puffs into the lungs in the morning and at bedtime. 07/22/22   Waymon Budge, MD  cetirizine (ZYRTEC) 5 MG tablet Take 1 tablet (5 mg total) by mouth daily. 11/19/22 12/19/22  Garrison, [place] N, FNP  COVID-19 mRNA vaccine [phone] (COMIRNATY) syringe Inject  into the muscle. 09/29/22     Evolocumab (REPATHA SURECLICK) 140 MG/ML SOAJ Inject 140 mg into the skin every 14 (fourteen) days. 10/27/22   Pricilla Riffle, MD  famotidine (PEPCID) 20 MG tablet TAKE ONE TABLET BY MOUTH EVERY MORNING and TAKE ONE TABLET BY MOUTH EVERYDAY AT BEDTIME 03/16/23   [place], [name], MD  fluticasone (FLONASE) 50 MCG/ACT nasal spray PLACE 2 SPRAYS INTO BOTH NOSTRILS AT BEDTIME AS NEEDED FOR ALLERGIES. 12/15/22   [place], [name], MD  gabapentin (NEURONTIN) 300 MG capsule TAKE ONE CAPSULE BY MOUTH AT [facility] AND AT BEDTIME 11/03/22   Penumalli, Glenford Bayley, MD  hydrochlorothiazide (HYDRODIURIL) 25 MG tablet TAKE ONE TABLET BY MOUTH ONCE DAILY 10/18/22   [place], [name], MD  losartan (COZAAR) 100 MG tablet TAKE ONE TABLET BY MOUTH EVERY MORNING 10/18/22   [place], [name], MD  meclizine (ANTIVERT) 25 MG tablet Take 1 tablet (25 mg total) by mouth 3 (three) times daily as needed for dizziness. 02/13/23   Henderly, Britni A, PA-C  metoprolol succinate (TOPROL-XL) 100 MG 24 hr tablet TAKE ONE TABLET BY MOUTH AT NOON 10/18/22   [place], [name], MD  montelukast (SINGULAIR) 10 MG tablet TAKE ONE TABLET BY MOUTH EVERYDAY AT BEDTIME 03/22/22   [place], [name], MD  MYRBETRIQ 50 MG TB24 tablet Take 50 mg by mouth daily. 10/29/21   [provider]  nitroGLYCERIN (NITROSTAT) 0.4 MG SL tablet Place 1 tablet (0.4  mg total) under the tongue every 5 (five) minutes as needed for chest pain. 05/26/21   Pricilla Riffle, MD  ondansetron (ZOFRAN-ODT) 4 MG disintegrating tablet 4mg  ODT q4 hours prn nausea/vomit 08/01/21   Melene Plan, DO  pantoprazole (PROTONIX) 40 MG tablet TAKE ONE TABLET BY MOUTH ONCE DAILY 10/18/22   Cirigliano, Vito V, DO  rosuvastatin (CRESTOR) 10 MG tablet Take 1 tablet (10 mg total) by mouth daily. 01/18/23 01/18/24  Pricilla Riffle, MD  sertraline (ZOLOFT) 50 MG tablet TAKE ONE TABLET BY MOUTH AT [facility] 11/17/22   [place], [name], MD  traMADol (ULTRAM) 50 MG tablet Take 1 tablet (50 mg total)  by mouth every 6 (six) hours as needed. 04/13/22 04/13/23  Cornett, Maisie Fus, MD  triamcinolone cream (KENALOG) 0.1 % Apply 1 Application topically 3 times/day as needed-between meals & bedtime. 11/17/22   [place], [name], MD      Allergies    Oxycodone-acetaminophen, Percocet [oxycodone-acetaminophen], Pravastatin sodium, Hydrocodone, and Aspirin    Review of Systems   Negative except as noted above in HPI  Physical Exam Updated Vital Signs BP (!) 125/93 (BP Location: Left Arm)   Pulse 64   Temp 98.1 F (36.7 C) (Oral)   Resp 18   SpO2 97%  Physical Exam Vitals and nursing note reviewed.  Constitutional:      General: She is not in acute distress.    Appearance: She is well-developed.  HENT:     Head: Normocephalic and atraumatic.  Eyes:     Conjunctiva/sclera: Conjunctivae normal.  Cardiovascular:     Rate and Rhythm: Normal rate and regular rhythm.     Heart sounds: No murmur heard. Pulmonary:     Effort: Pulmonary effort is normal. No respiratory distress.     Breath sounds: Normal breath sounds.     Comments: Saturating well on room air Abdominal:     Palpations: Abdomen is soft.     Tenderness: There is no abdominal tenderness. There is no guarding.  Musculoskeletal:        General: No swelling or tenderness.     Cervical back: Neck supple.  Skin:    General: Skin is warm and dry.     Capillary Refill: Capillary refill takes less than 2 seconds.  Neurological:     Mental Status: She is alert and oriented to person, place, and time.  Psychiatric:        Mood and Affect: Mood normal.     ED Results / Procedures / Treatments   Labs (all labs ordered are listed, but only abnormal results are displayed) Labs Reviewed  BASIC METABOLIC PANEL - Abnormal; Notable for the following components:      Result Value   Glucose, Bld 163 (*)    Creatinine, Ser 1.65 (*)    GFR, Estimated 31 (*)    All other components within normal limits  CBC - Abnormal; Notable for the  following components:   RDW 15.9 (*)    All other components within normal limits  TROPONIN I (HIGH SENSITIVITY)  TROPONIN I (HIGH SENSITIVITY)    EKG EKG Interpretation Date/Time:  Thursday March 24 2023 19:15:57 EDT Ventricular Rate:  69 PR Interval:  164 QRS Duration:  88 QT Interval:  410 QTC Calculation: 439 R Axis:   -39  Text Interpretation: Normal sinus rhythm Left axis deviation Cannot rule out Anterior infarct , age undetermined Abnormal ECG When compared with ECG of 13-Feb-2023 16:22, No significant change since last tracing Confirmed  by Richardean Canal [id number]) on 03/24/2023 9:05:51 PM  Radiology DG Chest 2 View  Result Date: 03/24/2023 CLINICAL DATA:  Chest pain. EXAM: CHEST - 2 VIEW COMPARISON:  Chest radiographs 06/14/2022. FINDINGS: Clear lungs. Stable cardiac and mediastinal contours. No pleural effusion or pneumothorax. Visualized bones and upper abdomen are unremarkable. IMPRESSION: No evidence of acute cardiopulmonary disease. Electronically Signed   By: Orvan Falconer M.D.   On: 03/24/2023 21:53      Medications Ordered in ED Medications  aspirin chewable tablet 243 mg (243 mg Oral Given 03/24/23 2143)    ED Course/ Medical Decision Making/ A&P Clinical Course as of 03/25/23 0013  Thu Mar 24, 2023  2234 Discussed case with cardiology. Will see patient as an inpatient if she has any abnormalities noted on her stress test. Rec'd admit to medicine [CD]    Clinical Course User Index [CD] Rhys Martini, DO            HEART Score: 6                    Medical Decision Making Problems Addressed: Chest pain, unspecified type: complicated acute illness or injury  Amount and/or Complexity of Data Reviewed Labs: ordered. Radiology: ordered.  Risk OTC drugs. Decision regarding hospitalization.    Patient is a 77 year old female with past medical history of CAD status post DES to left circumflex, hypertension, hyperlipidemia, diabetes, CKD status  post right nephrectomy, RA, and OSA presenting today for chest pain.  On exam, patient is alert and oriented.  She is in regular rate and rhythm.  Lungs are clear to auscultation.  She has no significant lower extremity edema present.  Presentation concerning for ACS given her history and similarity to prior episodes.  Will also evaluate for pneumonia, pneumothorax, pulmonary edema.  Low suspicion for PE as she has no unilateral leg swelling, immobility, or history of blood clots.  EKG reveals sinus rhythm at 69 bpm without any acute ST or T wave abnormalities.  Chest x-ray shows no acute cardiopulmonary disease.  Lab work reveals increased troponin at 1.65, similar to prior at 1.51.  Troponins are negative x 2.  Patient took an 81 mg aspirin at home, she is given an additional 243 mg.  She has a hear score of 6.  She has not had a stress test since 2022.  Will admit for chest pain with concern for possible unstable angina.  Discussed case with hospitalist who is in agreement with plan.  She is hemodynamically stable and appropriate for transfer to the floor at this time.   Final Clinical Impression(s) / ED Diagnoses Final diagnoses:  Chest pain, unspecified type    Rx / DC Orders ED Discharge Orders     None         Rhys Martini, DO 03/25/23 0013    Charlynne Pander, MD 03/30/23 [phone]

## 2023-03-24 NOTE — ED Triage Notes (Signed)
Pt presents with nagging crampy chest pain on the left, non radiating, that woke her from sleep this morning.  Has had shob. No n/v/d.  Reports hx of MI with stent placement in the past.

## 2023-03-25 ENCOUNTER — Observation Stay (HOSPITAL_BASED_OUTPATIENT_CLINIC_OR_DEPARTMENT_OTHER): Payer: Medicare HMO

## 2023-03-25 DIAGNOSIS — R079 Chest pain, unspecified: Secondary | ICD-10-CM

## 2023-03-25 DIAGNOSIS — R072 Precordial pain: Secondary | ICD-10-CM | POA: Diagnosis not present

## 2023-03-25 LAB — NM MYOCAR MULTI W/SPECT W/WALL MOTION / EF
Base ST Depression (mm): 0 mm
Estimated workload: 1
Exercise duration (min): 7 min
Exercise duration (sec): 25 s
MPHR: 141 {beats}/min
Peak HR: 81 {beats}/min
Percent HR: 57 %
Rest HR: 58 {beats}/min
Rest Nuclear Isotope Dose: 10.8 mCi
ST Depression (mm): 0 mm
Stress Nuclear Isotope Dose: 32.4 mCi

## 2023-03-25 MED ORDER — PANTOPRAZOLE SODIUM 40 MG PO TBEC
40.0000 mg | DELAYED_RELEASE_TABLET | Freq: Every day | ORAL | Status: DC
  Administered 2023-03-25: 40 mg via ORAL
  Filled 2023-03-25: qty 1

## 2023-03-25 MED ORDER — REGADENOSON 0.4 MG/5ML IV SOLN
INTRAVENOUS | Status: AC
  Filled 2023-03-25: qty 5

## 2023-03-25 MED ORDER — NITROGLYCERIN 0.4 MG SL SUBL: 0.4000 mg | SUBLINGUAL_TABLET | SUBLINGUAL | Status: DC | PRN

## 2023-03-25 MED ORDER — ASPIRIN 81 MG PO CHEW
81.0000 mg | CHEWABLE_TABLET | Freq: Every day | ORAL | Status: DC
  Administered 2023-03-25: 81 mg via ORAL
  Filled 2023-03-25: qty 1

## 2023-03-25 MED ORDER — SERTRALINE HCL 50 MG PO TABS
50.0000 mg | ORAL_TABLET | Freq: Every day | ORAL | Status: DC
  Administered 2023-03-25: 50 mg via ORAL
  Filled 2023-03-25: qty 1

## 2023-03-25 MED ORDER — FLUTICASONE FUROATE-VILANTEROL 200-25 MCG/ACT IN AEPB
1.0000 | INHALATION_SPRAY | Freq: Every day | RESPIRATORY_TRACT | Status: DC
  Administered 2023-03-25: 1 via RESPIRATORY_TRACT
  Filled 2023-03-25: qty 28

## 2023-03-25 MED ORDER — REGADENOSON 0.4 MG/5ML IV SOLN
0.4000 mg | Freq: Once | INTRAVENOUS | Status: AC
  Administered 2023-03-25: 0.4 mg via INTRAVENOUS
  Filled 2023-03-25: qty 5

## 2023-03-25 MED ORDER — MIRABEGRON ER 25 MG PO TB24
25.0000 mg | ORAL_TABLET | ORAL | Status: DC
  Administered 2023-03-25: 25 mg via ORAL
  Filled 2023-03-25: qty 1

## 2023-03-25 MED ORDER — TECHNETIUM TC 99M TETROFOSMIN IV KIT
32.4000 | PACK | Freq: Once | INTRAVENOUS | Status: AC | PRN
  Administered 2023-03-25: 32.4 via INTRAVENOUS

## 2023-03-25 MED ORDER — UMECLIDINIUM BROMIDE 62.5 MCG/ACT IN AEPB
1.0000 | INHALATION_SPRAY | Freq: Every day | RESPIRATORY_TRACT | Status: DC
  Administered 2023-03-25: 1 via RESPIRATORY_TRACT
  Filled 2023-03-25 (×2): qty 7

## 2023-03-25 MED ORDER — LOSARTAN POTASSIUM 50 MG PO TABS
100.0000 mg | ORAL_TABLET | Freq: Every morning | ORAL | Status: DC
  Administered 2023-03-25: 100 mg via ORAL
  Filled 2023-03-25: qty 2

## 2023-03-25 MED ORDER — METOPROLOL SUCCINATE ER 100 MG PO TB24
100.0000 mg | ORAL_TABLET | Freq: Every day | ORAL | Status: DC
  Administered 2023-03-25: 100 mg via ORAL
  Filled 2023-03-25: qty 1

## 2023-03-25 MED ORDER — ROSUVASTATIN CALCIUM 5 MG PO TABS
10.0000 mg | ORAL_TABLET | Freq: Every day | ORAL | Status: DC
  Administered 2023-03-25: 10 mg via ORAL
  Filled 2023-03-25: qty 2

## 2023-03-25 MED ORDER — HYDROCHLOROTHIAZIDE 25 MG PO TABS
25.0000 mg | ORAL_TABLET | Freq: Every day | ORAL | Status: DC
  Administered 2023-03-25: 25 mg via ORAL
  Filled 2023-03-25: qty 1

## 2023-03-25 MED ORDER — BUDESON-GLYCOPYRROL-FORMOTEROL 160-9-4.8 MCG/ACT IN AERO: 2.0000 | INHALATION_SPRAY | Freq: Two times a day (BID) | RESPIRATORY_TRACT | Status: DC

## 2023-03-25 MED ORDER — TECHNETIUM TC 99M TETROFOSMIN IV KIT
10.8000 | PACK | Freq: Once | INTRAVENOUS | Status: AC | PRN
  Administered 2023-03-25: 10.8 via INTRAVENOUS

## 2023-03-25 NOTE — Discharge Summary (Signed)
Physician Discharge Summary  [name] [id number]:[id number] DOB: 1945-11-09 DOA: 03/24/2023  PCP: [place], [name], MD  Admit date: 03/24/2023 Discharge date: 03/25/2023  Admitted From: Home Disposition: Home  Recommendations for Outpatient Follow-up:  Follow up with PCP in 1 week with  Outpatient follow-up with cardiology Follow up in ED if symptoms worsen or new appear   Home Health: No Equipment/Devices: None  Discharge Condition: Stable CODE STATUS: Full Diet recommendation: Heart healthy  Brief/Interim Summary: 77 y.o. female with medical history significant of CAD status post PCI, hypertension, hyperlipidemia, CKD, rheumatoid arthritis presented with chest pain.  On presentation, troponins were negative; EKG showed no acute ischemic changes.  During the hospitalization, she underwent stress test as per cardiology recommendations.  Stress test was negative.  She is currently chest pain-free.  She will be discharged home today with outpatient follow-up with PCP and cardiology.  Discharge Diagnoses:   Chest pain in a patient with a history of CAD Hypertension Hyperlipidemia -On presentation, troponins were negative; EKG showed no acute ischemic changes.  During the hospitalization, she underwent stress test as per cardiology recommendations.  Stress test was negative.  She is currently chest pain-free.  She will be discharged home today with outpatient follow-up with PCP and cardiology. -Continue aspirin, metoprolol, losartan, hydrochlorothiazide, statin  Asthma -Stable.  Continue home regimen.  Outpatient follow-up with PCP  GERD -Continue PPI  CKD stage IIIb -Currently stable.  Outpatient follow-up  Depression -Continue Zoloft  Obesity -Outpatient follow-up    Discharge Instructions  Discharge Instructions     Ambulatory referral to Cardiology   Complete by: As directed    Diet - low sodium heart healthy   Complete by: As directed    Increase activity slowly    Complete by: As directed       Allergies as of 03/25/2023       Reactions   Oxycodone-acetaminophen Hives, Itching   hallucinations   Percocet [oxycodone-acetaminophen] Hives, Itching, Other (See Comments)   hallucinations   Pravastatin Sodium Other (See Comments)   Muscle aches Muscle aches   Hydrocodone Other (See Comments)   "crazy dreams"   Aspirin Other (See Comments)   GI upset Other reaction(s): Other (See Comments) REACTION: GI upset        Medication List     STOP taking these medications    Comirnaty syringe Generic drug: COVID-19 mRNA vaccine 2023-2024   traMADol 50 MG tablet Commonly known as: Ultram       TAKE these medications    acetaminophen 500 MG tablet Commonly known as: TYLENOL Take 1,000 mg by mouth as needed for moderate pain or headache.   albuterol 108 (90 Base) MCG/ACT inhaler Commonly known as: VENTOLIN HFA INHALE ONE OR TWO PUFFS BY MOUTH INTO THE LUNGS EVERY SIX HOURS AS NEEDED FOR WHEEZING OR SHORTNESS OF BREATH   aspirin 81 MG chewable tablet Chew 81 mg by mouth daily.   Breztri Aerosphere 160-9-4.8 MCG/ACT Aero Generic drug: Budeson-Glycopyrrol-Formoterol Inhale 2 puffs into the lungs in the morning and at bedtime.   cetirizine 5 MG tablet Commonly known as: ZYRTEC Take 1 tablet (5 mg total) by mouth daily.   famotidine 20 MG tablet Commonly known as: PEPCID TAKE ONE TABLET BY MOUTH EVERY MORNING and TAKE ONE TABLET BY MOUTH EVERYDAY AT BEDTIME   fluticasone 50 MCG/ACT nasal spray Commonly known as: FLONASE PLACE 2 SPRAYS INTO BOTH NOSTRILS AT BEDTIME AS NEEDED FOR ALLERGIES.   gabapentin 300 MG capsule Commonly known as: NEURONTIN  TAKE ONE CAPSULE BY MOUTH AT BREAKFAST AND AT BEDTIME   hydrochlorothiazide 25 MG tablet Commonly known as: HYDRODIURIL TAKE ONE TABLET BY MOUTH ONCE DAILY   losartan 100 MG tablet Commonly known as: COZAAR TAKE ONE TABLET BY MOUTH EVERY MORNING   meclizine 25 MG tablet Commonly  known as: ANTIVERT Take 1 tablet (25 mg total) by mouth 3 (three) times daily as needed for dizziness.   metoprolol succinate 100 MG 24 hr tablet Commonly known as: TOPROL-XL TAKE ONE TABLET BY MOUTH AT NOON   montelukast 10 MG tablet Commonly known as: SINGULAIR TAKE ONE TABLET BY MOUTH EVERYDAY AT BEDTIME   Myrbetriq 50 MG Tb24 tablet Generic drug: mirabegron ER Take 50 mg by mouth daily.   nitroGLYCERIN 0.4 MG SL tablet Commonly known as: NITROSTAT Place 1 tablet (0.4 mg total) under the tongue every 5 (five) minutes as needed for chest pain.   ondansetron 4 MG disintegrating tablet Commonly known as: ZOFRAN-ODT 4mg  ODT q4 hours prn nausea/vomit   pantoprazole 40 MG tablet Commonly known as: PROTONIX TAKE ONE TABLET BY MOUTH ONCE DAILY   Repatha SureClick 140 MG/ML Soaj Generic drug: Evolocumab Inject 140 mg into the skin every 14 (fourteen) days.   rosuvastatin 10 MG tablet Commonly known as: CRESTOR Take 1 tablet (10 mg total) by mouth daily.   sertraline 50 MG tablet Commonly known as: ZOLOFT TAKE ONE TABLET BY MOUTH AT NOON   triamcinolone cream 0.1 % Commonly known as: KENALOG Apply 1 Application topically 3 times/day as needed-between meals & bedtime.        Follow-up Information     [place], [name], MD. Schedule an appointment as soon as possible for a visit in 1 week(s).   Specialty: Family Medicine Contact information: [address] [place] [address] [phone]                Allergies  Allergen Reactions   Oxycodone-Acetaminophen Hives and Itching    hallucinations   Percocet [Oxycodone-Acetaminophen] Hives, Itching and Other (See Comments)    hallucinations   Pravastatin Sodium Other (See Comments)    Muscle aches Muscle aches   Hydrocodone Other (See Comments)    "crazy dreams"   Aspirin Other (See Comments)    GI upset Other reaction(s): Other (See Comments) REACTION: GI upset    Consultations: Cardiology  recommended no official consultation since stress test was low risk.  Procedures/Studies: NM Myocar Multi W/Spect W/Wall Motion / EF  Result Date: 03/25/2023   Findings are consistent with infarction. The study is low risk.   No ST deviation was noted.   LV perfusion is abnormal. Defect 1: There is a small defect with mild reduction in uptake present in the basal inferolateral location(s) that is fixed. There is abnormal wall motion in the defect area. Consistent with infarction.   Left ventricular function is normal. The left ventricular ejection fraction is normal (55-65%). End diastolic cavity size is normal. End systolic cavity size is normal.   Prior study available for comparison. There is a small inferolateral infarct without evidence of ischemia. Apical thinning with normal apical function. Similar to prior study.  Apical thinning not seen in prior; LCX perfusion has improved from 09/01/2020 study.   DG Chest 2 View  Result Date: 03/24/2023 CLINICAL DATA:  Chest pain. EXAM: CHEST - 2 VIEW COMPARISON:  Chest radiographs 06/14/2022. FINDINGS: Clear lungs. Stable cardiac and mediastinal contours. No pleural effusion or pneumothorax. Visualized bones and upper abdomen are unremarkable. IMPRESSION:  No evidence of acute cardiopulmonary disease. Electronically Signed   By: Orvan Falconer M.D.   On: 03/24/2023 21:53      Subjective: Patient seen and examined at bedside.  Denies any current chest pain.  No fever or vomiting reported.  Discharge Exam: Vitals:   03/25/23 1149 03/25/23 1314  BP: 138/82 133/62  Pulse: 61 (!) 59  Resp:  18  Temp:  (!) 97.5 F (36.4 C)  SpO2:  92%    General: Pt is alert, awake, not in acute distress Cardiovascular: Mild intermittent bradycardia present, S1/S2 + Respiratory: bilateral decreased breath sounds at bases Abdominal: Soft, obese, NT, ND, bowel sounds + Extremities: no edema, no cyanosis    The results of significant diagnostics from this  hospitalization (including imaging, microbiology, ancillary and laboratory) are listed below for reference.     Microbiology: No results found for this or any previous visit (from the past 240 hour(s)).   Labs: BNP (last 3 results) No results for input(s): "BNP" in the last 8760 hours. Basic Metabolic Panel: Recent Labs  Lab 03/24/23 1937  NA 139  K 3.5  CL 100  CO2 24  GLUCOSE 163*  BUN 22  CREATININE 1.65*  CALCIUM 9.7   Liver Function Tests: No results for input(s): "AST", "ALT", "ALKPHOS", "BILITOT", "PROT", "ALBUMIN" in the last 168 hours. No results for input(s): "LIPASE", "AMYLASE" in the last 168 hours. No results for input(s): "AMMONIA" in the last 168 hours. CBC: Recent Labs  Lab 03/24/23 1937  WBC 8.8  HGB 14.3  HCT 44.2  MCV 88.9  PLT 261   Cardiac Enzymes: No results for input(s): "CKTOTAL", "CKMB", "CKMBINDEX", "TROPONINI" in the last 168 hours. BNP: Invalid input(s): "POCBNP" CBG: No results for input(s): "GLUCAP" in the last 168 hours. D-Dimer No results for input(s): "DDIMER" in the last 72 hours. Hgb A1c No results for input(s): "HGBA1C" in the last 72 hours. Lipid Profile No results for input(s): "CHOL", "HDL", "LDLCALC", "TRIG", "CHOLHDL", "LDLDIRECT" in the last 72 hours. Thyroid function studies No results for input(s): "TSH", "T4TOTAL", "T3FREE", "THYROIDAB" in the last 72 hours.  Invalid input(s): "FREET3" Anemia work up No results for input(s): "VITAMINB12", "FOLATE", "FERRITIN", "TIBC", "IRON", "RETICCTPCT" in the last 72 hours. Urinalysis    Component Value Date/Time   COLORURINE YELLOW 02/13/2023 1624   APPEARANCEUR CLEAR 02/13/2023 1624   LABSPEC 1.014 02/13/2023 1624   PHURINE 7.0 02/13/2023 1624   GLUCOSEU NEGATIVE 02/13/2023 1624   HGBUR NEGATIVE 02/13/2023 1624   HGBUR negative 11/18/2008 1032   BILIRUBINUR NEGATIVE 02/13/2023 1624   BILIRUBINUR n 06/01/2016 1041   KETONESUR NEGATIVE 02/13/2023 1624   PROTEINUR  NEGATIVE 02/13/2023 1624   UROBILINOGEN 1.0 11/05/2021 1612   NITRITE NEGATIVE 02/13/2023 1624   LEUKOCYTESUR SMALL (A) 02/13/2023 1624   Sepsis Labs Recent Labs  Lab 03/24/23 1937  WBC 8.8   Microbiology No results found for this or any previous visit (from the past 240 hour(s)).   Time coordinating discharge: 35 minutes  SIGNED:   Glade Lloyd, MD  [facility] 03/25/2023, 1:59 PM

## 2023-03-25 NOTE — H&P (Signed)
History and Physical    [name] KVQ:[id number] DOB: 1946/04/30 DOA: 03/24/2023  PCP: [place], [name], MD   Chief Complaint: cp  HPI: [name] is a 77 y.o. female with medical history significant of CAD status post PCI, hypertension, hyperlipidemia, CKD, rheumatoid arthritis who presented to the emergency department with chest pain.  Patient woke up this morning around 8 AM with left-sided chest pain that is been intermittent throughout the day and worse with exertion.  She did not take any nitroglycerin however due to concern over persistent chest pain she presented to the ER for further assessment.  On arrival to the emergency department she was afebrile and hemodynamically stable.  Labs obtained on presentation which revealed creatinine 1.65 at baseline, glucose 163, CBC unrevealing, troponin is within normal limits, chest x-ray showed no acute findings.  Patient was admitted for further workup of her likely cardiac chest pain.  On evaluation patient states that she has not had recurrence of chest pain since presentation.  Of note she had a heart cath 2 years ago which showed suspected fixed defect involving the lateral wall.  She had a heart cath in 2018 which showed 100% circumflex lesion with multiple other diseased vessels.   Review of Systems: Review of Systems  Constitutional:  Negative for chills and fever.  Cardiovascular:  Positive for chest pain. Negative for palpitations, orthopnea and leg swelling.  Gastrointestinal:  Negative for heartburn, nausea and vomiting.  Genitourinary: Negative.  Negative for dysuria and urgency.  Musculoskeletal: Negative.   Skin: Negative.   Neurological: Negative.   Endo/Heme/Allergies: Negative.   Psychiatric/Behavioral: Negative.    All other systems reviewed and are negative.    As per HPI otherwise 10 point review of systems negative.   Allergies  Allergen Reactions   Oxycodone-Acetaminophen Hives and Itching    hallucinations    Percocet [Oxycodone-Acetaminophen] Hives, Itching and Other (See Comments)    hallucinations   Pravastatin Sodium Other (See Comments)    Muscle aches Muscle aches   Hydrocodone Other (See Comments)    "crazy dreams"   Aspirin Other (See Comments)    GI upset Other reaction(s): Other (See Comments) REACTION: GI upset    Past Medical History:  Diagnosis Date   Allergy    Anxiety    C. difficile diarrhea    history of   CAD (coronary artery disease), native coronary artery    a. 05/2017 showed 100% mLCx tx with DES, otherwise 20% mRCA, 50% ramus, 30% dLAD, 20% prox-mid LAD, 20% mLAD treated medically.    Cancer [facility])    Right kidney CA removed.    CKD (chronic kidney disease) stage 3, GFR 30-59 ml/min (HCC) 10/11/2016   s/p R nephrectomy   Colon polyps 2008   HYPERPLASTIC   COPD (chronic obstructive pulmonary disease) (HCC)    former smoker   Gastritis    GERD (gastroesophageal reflux disease)    History of echocardiogram    Echo 3/18:  Moderate LVH, EF 60-65, grade 1 diastolic dysfunction, calcified aortic valve, mild MR, moderate LAE   History of nuclear stress test    Myoview 3/18: Mod size and intensity fixed septal defect, may be artifact.Opposite mod size and intensity lat defect, which is reversible and could represent ischemia or possibly artifact (SDS 4). LVEF 71% with normal wall motion. Intermediate risk study. >> images reviewed with Dr. Dietrich Pates - no sig ischemia; med rx    Hx of cardiovascular stress test    [facility]  Myoview 6/16:  EF 70%, no scar or ischemia; Low Risk   Hyperlipidemia    Hypertension    Neuropathy    Orthostatic hypotension 05/28/2017   OSA (obstructive sleep apnea) 01/16/2021   Osteoarthritis    Pre-diabetes    no meds   Rheumatoid arthritis (HCC)    RA (Dr. Dareen Piano) Bilateral hands   S/P angioplasty with stent 05/27/17 to LCX with DES  05/28/2017   Sleep apnea 2023   does not use CPAP   Thyroid disease     Past Surgical History:   Procedure Laterality Date   ABDOMINAL HYSTERECTOMY  1978   ANTERIOR CERVICAL DECOMP/DISCECTOMY FUSION N/A 05/29/2018   Procedure: ANTERIOR CERVICAL DECOMPRESSION FUSION - CERVICAL FIVE-CERVICAL SIX - CERVICAL SIX-CERVICAL SEVEN;  Surgeon: Julio Sicks, MD;  Location: [facility] OR;  Service: Neurosurgery;  Laterality: N/A;   BACK SURGERY     x 2   BREAST LUMPECTOMY WITH RADIOACTIVE SEED LOCALIZATION Right 04/13/2022   Procedure: RIGHT BREAST SEED LUMPECTOMY;  Surgeon: Harriette Bouillon, MD;  Location: [facility];  Service: General;  Laterality: Right;   CARPAL TUNNEL RELEASE Left    COLONOSCOPY W/ BIOPSIES AND POLYPECTOMY     Hx: of   CORONARY STENT INTERVENTION N/A 05/27/2017   Procedure: CORONARY STENT INTERVENTION;  Surgeon: Kathleene Hazel, MD;  Location: [facility] INVASIVE CV LAB;  Service: Cardiovascular;  Laterality: N/A;   ESOPHAGOGASTRODUODENOSCOPY     HNP     LEFT HEART CATH AND CORONARY ANGIOGRAPHY N/A 05/27/2017   Procedure: LEFT HEART CATH AND CORONARY ANGIOGRAPHY;  Surgeon: Kathleene Hazel, MD;  Location: [facility] INVASIVE CV LAB;  Service: Cardiovascular;  Laterality: N/A;   LUMBAR LAMINECTOMY/DECOMPRESSION MICRODISCECTOMY Left 02/12/2013   Procedure: LUMBAR TWO THREE, LUMBAR THREE FOUR, LUMBAR FOUR FIVE  LAMINECTOMY/DECOMPRESSION MICRODISCECTOMY 3 LEVELS;  Surgeon: Temple Pacini, MD;  Location: [facility] NEURO ORS;  Service: Neurosurgery;  Laterality: Left;   NEPHRECTOMY Right 2010   10.rcc cancer   TOTAL KNEE ARTHROPLASTY Right    Redo     reports that she quit smoking about 18 months ago. Her smoking use included cigarettes. She started smoking about 54 years ago. She has a 10.6 pack-year smoking history. She has never used smokeless tobacco. She reports that she does not drink alcohol and does not use drugs.  Family History  Problem Relation Age of Onset   Rheum arthritis Mother    Stroke Mother    Prostate cancer Father    Heart disease Father    Diabetes Brother     Dementia Brother    Parkinsonism Brother    Diabetes Brother    Cancer Brother    Dementia Brother    Kidney disease Son    Bipolar disorder Daughter    Colon cancer Neg Hx    Esophageal cancer Neg Hx    Pancreatic cancer Neg Hx    Liver disease Neg Hx    Rectal cancer Neg Hx    Stomach cancer Neg Hx     Prior to Admission medications   Medication Sig Start Date End Date Taking? Authorizing Provider  acetaminophen (TYLENOL) 500 MG tablet Take 1,000 mg by mouth as needed for moderate pain or headache.     [provider]  albuterol (VENTOLIN HFA) 108 (90 Base) MCG/ACT inhaler INHALE ONE OR TWO PUFFS BY MOUTH INTO THE LUNGS EVERY SIX HOURS AS NEEDED FOR WHEEZING OR SHORTNESS OF BREATH 04/20/22   Waymon Budge, MD  aspirin 81 MG chewable tablet  Chew 81 mg by mouth daily.    [provider]  Budeson-Glycopyrrol-Formoterol (BREZTRI AEROSPHERE) 160-9-4.8 MCG/ACT AERO Inhale 2 puffs into the lungs in the morning and at bedtime. 07/22/22   Waymon Budge, MD  cetirizine (ZYRTEC) 5 MG tablet Take 1 tablet (5 mg total) by mouth daily. 11/19/22 12/19/22  Garrison, [place] N, FNP  COVID-19 mRNA vaccine 225 [id number] [id number] (COMIRNATY) syringe Inject into the muscle. 09/29/22     Evolocumab (REPATHA SURECLICK) 140 MG/ML SOAJ Inject 140 mg into the skin every 14 (fourteen) days. 10/27/22   Pricilla Riffle, MD  famotidine (PEPCID) 20 MG tablet TAKE ONE TABLET BY MOUTH EVERY MORNING and TAKE ONE TABLET BY MOUTH EVERYDAY AT BEDTIME 03/16/23   [place], [name], MD  fluticasone (FLONASE) 50 MCG/ACT nasal spray PLACE 2 SPRAYS INTO BOTH NOSTRILS AT BEDTIME AS NEEDED FOR ALLERGIES. 12/15/22   [place], [name], MD  gabapentin (NEURONTIN) 300 MG capsule TAKE ONE CAPSULE BY MOUTH AT [facility] AND AT BEDTIME 11/03/22   Penumalli, Glenford Bayley, MD  hydrochlorothiazide (HYDRODIURIL) 25 MG tablet TAKE ONE TABLET BY MOUTH ONCE DAILY 10/18/22   [place], [name], MD  losartan (COZAAR) 100 MG tablet TAKE ONE TABLET BY MOUTH  EVERY MORNING 10/18/22   [place], [name], MD  meclizine (ANTIVERT) 25 MG tablet Take 1 tablet (25 mg total) by mouth 3 (three) times daily as needed for dizziness. 02/13/23   Henderly, Britni A, PA-C  metoprolol succinate (TOPROL-XL) 100 MG 24 hr tablet TAKE ONE TABLET BY MOUTH AT NOON 10/18/22   [place], [name], MD  montelukast (SINGULAIR) 10 MG tablet TAKE ONE TABLET BY MOUTH EVERYDAY AT BEDTIME 03/22/22   [place], [name], MD  MYRBETRIQ 50 MG TB24 tablet Take 50 mg by mouth daily. 10/29/21   [provider]  nitroGLYCERIN (NITROSTAT) 0.4 MG SL tablet Place 1 tablet (0.4 mg total) under the tongue every 5 (five) minutes as needed for chest pain. 05/26/21   Pricilla Riffle, MD  ondansetron (ZOFRAN-ODT) 4 MG disintegrating tablet 4mg  ODT q4 hours prn nausea/vomit 08/01/21   Melene Plan, DO  pantoprazole (PROTONIX) 40 MG tablet TAKE ONE TABLET BY MOUTH ONCE DAILY 10/18/22   Cirigliano, Vito V, DO  rosuvastatin (CRESTOR) 10 MG tablet Take 1 tablet (10 mg total) by mouth daily. 01/18/23 01/18/24  Pricilla Riffle, MD  sertraline (ZOLOFT) 50 MG tablet TAKE ONE TABLET BY MOUTH AT [facility] 11/17/22   [place], [name], MD  traMADol (ULTRAM) 50 MG tablet Take 1 tablet (50 mg total) by mouth every 6 (six) hours as needed. 04/13/22 04/13/23  Cornett, Maisie Fus, MD  triamcinolone cream (KENALOG) 0.1 % Apply 1 Application topically 3 times/day as needed-between meals & bedtime. 11/17/22   [place], [name], MD    Physical Exam: Vitals:   03/24/23 1924 03/24/23 2355  BP: 129/69 (!) 125/93  Pulse: 73 64  Resp: 18 18  Temp: 97.8 F (36.6 C) 98.1 F (36.7 C)  TempSrc:  Oral  SpO2: 95% 97%   Physical Exam Vitals reviewed.  Constitutional:      Appearance: She is normal weight.  HENT:     Head: Normocephalic.  Cardiovascular:     Rate and Rhythm: Normal rate and regular rhythm.     Heart sounds: Normal heart sounds.  Pulmonary:     Effort: Pulmonary effort is normal.     Breath sounds: Normal breath sounds.   Abdominal:     General: Bowel sounds are normal.  Palpations: Abdomen is soft.  Musculoskeletal:        General: Normal range of motion.     Cervical back: Normal range of motion.  Skin:    General: Skin is warm and dry.     Capillary Refill: Capillary refill takes less than 2 seconds.  Neurological:     General: No focal deficit present.     Mental Status: She is alert.       Labs on Admission: I have personally reviewed the patients's labs and imaging studies.  Assessment/Plan Principal Problem:   Chest pain   # Likely cardiac chest pain - Patient had cath 6 years ago which demonstrated multivessel disease -Emergency department called cardiology who recommended stress test -EKG in emergency department showed no acute ischemic changes - Troponins negative  Plan: N.p.o. at midnight Obtain cardiac stress test  # CAD-continue aspirin  # Hypertension-continue metoprolol, losartan, hydrochlorothiazide  # Asthma-continue home inhaler  # Urinary retention-continue Myrbetriq's  # GERD-continue Protonix  # Hyperlipidemia-continue Crestor  # Depression-continue Zoloft  # CKD stage IIIb-trend     Admission status: Observation Telemetry Cardiac  Certification: The appropriate patient status for this patient is OBSERVATION. Observation status is judged to be reasonable and necessary in order to provide the required intensity of service to ensure the patient's safety. The patient's presenting symptoms, physical exam findings, and initial radiographic and laboratory data in the context of their medical condition is felt to place them at decreased risk for further clinical deterioration. Furthermore, it is anticipated that the patient will be medically stable for discharge from the hospital within 2 midnights of admission.     Alan Mulder MD [facility] If 7PM-7AM, please contact night-coverage [email or web address]  03/25/2023, 1:06 AM

## 2023-03-25 NOTE — Consult Note (Signed)
   [facility]  [facility] Inpatient Consult   03/25/2023  [name] November 21, 1945 [id number]  [facility] [[facility]]  Accountable Care Organization [ACO] Patient: [name] Medicare insurance  Primary Care Provider:  [place], [name], MD with [facility] is listed to provide the transition of care follow up calls and appointments  Patient screened for observation hospitalization with noted to assess for potential [facility]  [[facility]] Care Management service needs for post hospital transition for care coordination.  Review of patient's electronic medical record reveals patient is from home with daughter.  Met with patient and daughter at the bedside to explain post hospital follow up needs.  Patient endorses PCP, no current needs expressed.Encouraged to express andy concerns with provider and may receive a post hospital follow up call, however patient is listed as observation status.  Plan:  Continue to follow progress and disposition to assess for post hospital community care coordination/management needs.  Referral request for community care coordination: patient has an upcoming appointment with PCP on 04/05/23 no additional needs noted. Patient express she knows who to call  Of note, [facility] Care Management/Population Health does not replace or interfere with any arrangements made by the Inpatient Transition of Care team.  For questions contact:   Charlesetta Shanks, RN BSN CCM [facility]  [phone] business mobile phone Toll free office [phone]  Fax number: [phone] [email or web address]

## 2023-03-25 NOTE — Plan of Care (Signed)
  Problem: Education: Goal: Knowledge of General Education information will improve Description: Including pain rating scale, medication(s)/side effects and non-pharmacologic comfort measures Outcome: Progressing   Problem: Coping: Goal: Level of anxiety will decrease Outcome: Progressing   

## 2023-03-25 NOTE — Progress Notes (Signed)
Patient admitted early this morning for chest pain and case was discussed with cardiology team by ED provider who recommended stress test.  Patient seen and examined at bedside.  Currently denies any chest pain.  I have reviewed patient's medical records including this morning's H&P, current vitals, labs and medications myself.  Follow stress test and cardiology recommendations.

## 2023-03-25 NOTE — TOC Transition Note (Signed)
Transition of Care [facility]) - CM/SW Discharge Note   Patient Details  Name: [name] MRN: [id number] Date of Birth: 05-09-1946  Transition of Care [facility]) CM/SW Contact:  Leone Haven, RN Phone Number: 03/25/2023, 2:07 PM   Clinical Narrative:    For dc today, daughter is at bedside to transport home. No needs.    Final next level of care: Home/Self Care Barriers to Discharge: No Barriers Identified   Patient Goals and CMS Choice   Choice offered to / list presented to : NA  Discharge Placement                         Discharge Plan and Services Additional resources added to the After Visit Summary for   In-house Referral: NA Discharge Planning Services: CM Consult Post Acute Care Choice: NA          DME Arranged: N/A         HH Arranged: NA          Social Determinants of Health (SDOH) Interventions SDOH Screenings   Food Insecurity: No Food Insecurity (10/20/2022)  Housing: Low Risk  (10/20/2022)  Transportation Needs: No Transportation Needs (09/08/2022)  Utilities: Not At Risk (09/08/2022)  Alcohol Screen: Low Risk  (09/08/2022)  Depression (PHQ2-9): Medium Risk (02/22/2023)  Financial Resource Strain: Low Risk  (09/08/2022)  Physical Activity: Inactive (09/08/2022)  Social Connections: Moderately Integrated (09/08/2022)  Stress: Stress Concern Present (09/08/2022)  Tobacco Use: Medium Risk (03/24/2023)     Readmission Risk Interventions     No data to display

## 2023-03-25 NOTE — TOC Initial Note (Signed)
Transition of Care [facility]) - Initial/Assessment Note    Patient Details  Name: [name] MRN: [id number] Date of Birth: 1946/06/22  Transition of Care [facility]) CM/SW Contact:    Leone Haven, RN Phone Number: 03/25/2023, 2:04 PM  Clinical Narrative:                 From home with daughter, has PCP and insurance on file, states has no HH services in place at this time , has walker, cane , cpap and grab bars at home.  States daughter will transport her home at [employer] and she is support system, states [facility] , and [facility].  Pta walks with walker or cane.  Expected Discharge Plan: Home/Self Care Barriers to Discharge: No Barriers Identified   Patient Goals and CMS Choice Patient states their goals for this hospitalization and ongoing recovery are:: return home   Choice offered to / list presented to : NA      Expected Discharge Plan and Services In-house Referral: NA Discharge Planning Services: CM Consult Post Acute Care Choice: NA Living arrangements for the past 2 months: Single Family Home Expected Discharge Date: 03/25/23               DME Arranged: N/A         HH Arranged: NA          Prior Living Arrangements/Services Living arrangements for the past 2 months: Single Family Home Lives with:: Adult Children Patient language and need for interpreter reviewed:: Yes Do you feel safe going back to the place where you live?: Yes      Need for Family Participation in Patient Care: Yes (Comment) Care giver support system in place?: Yes (comment) Current home services: DME (walker, cane, cpap, grab bars)    Activities of Daily Living      Permission Sought/Granted Permission sought to share information with : Case Manager Permission granted to share information with : Yes, Verbal Permission Granted              Emotional Assessment Appearance:: Appears stated age Attitude/Demeanor/Rapport: Engaged Affect  (typically observed): Appropriate Orientation: : Oriented to Self, Oriented to Place, Oriented to  Time, Oriented to Situation Alcohol / Substance Use: Not Applicable Psych Involvement: No (comment)  Admission diagnosis:  Chest pain [R07.9] Chest pain, unspecified type [R07.9] Patient Active Problem List   Diagnosis Date Noted   Local reaction to COVID-19 vaccine 10/12/2022   Moderate episode of recurrent major depressive disorder (HCC) 03/24/2022   PAD (peripheral artery disease) (HCC) 11/28/2021   OSA (obstructive sleep apnea) 01/16/2021   COPD mixed type (HCC) 01/16/2021   Fatigue 11/03/2020   B12 deficiency 11/03/2020   Chest pain 08/30/2020   Malignant neoplasm of kidney excluding renal pelvis, unspecified laterality (HCC) 09/26/2019   Vitamin D deficiency, unspecified 09/26/2019   Anxiety disorder, unspecified 03/26/2019   Tobacco use disorder 09/29/2018   Cervical myelopathy (HCC) 05/29/2018   ANA positive 12/08/2017   Cervicalgia 09/16/2017   Carpal tunnel syndrome of left wrist 07/21/2017   Chondrocalcinosis 07/21/2017   Neuropathy 07/21/2017   Orthostatic hypotension 05/28/2017   S/P angioplasty with stent 05/27/17 to LCX with DES  05/28/2017   CAD (coronary artery disease)    Polyneuropathy 05/25/2017   Chest pain with moderate risk for cardiac etiology 05/25/2017   Class 1 obesity with serious comorbidity and body mass index (BMI) of 34.0 to 34.9 in adult 12/06/2016   CKD (chronic kidney  disease) stage 3, GFR 30-59 ml/min (HCC) 10/11/2016   Hot flashes, menopausal 10/01/2016   Chest pain with moderate risk of acute coronary syndrome 10/25/2014   Degenerative spondylolisthesis 10/15/2014   Spinal stenosis, lumbar region, with neurogenic claudication 02/12/2013   DM (diabetes mellitus), type 2 with renal complications (HCC) 03/05/2010   History of Rtr nephrectomy 2010, secondary to renal cell cancer 01/14/2009   PERSONAL HX COLONIC POLYPS 11/14/2008   Hyperlipidemia  associated with type 2 diabetes mellitus (HCC) 11/16/2007   ESOPHAGEAL STRICTURE 10/12/2007   Hypertension with heart disease 01/19/2007   Allergic rhinitis 01/19/2007   GERD 01/19/2007   PCP:  [place], [name], MD Pharmacy:   [id number] - [place], [place] - [address] AT [facility] [address] [place] [address] Phone: [phone] Fax: [phone]  [facility] - [place], [place] - [address] [place] [address] Phone: [phone] Fax: [phone]     Social Determinants of Health (SDOH) Social History: SDOH Screenings   Food Insecurity: No Food Insecurity (10/20/2022)  Housing: Low Risk  (10/20/2022)  Transportation Needs: No Transportation Needs (09/08/2022)  Utilities: Not At Risk (09/08/2022)  Alcohol Screen: Low Risk  (09/08/2022)  Depression (PHQ2-9): Medium Risk (02/22/2023)  Financial Resource Strain: Low Risk  (09/08/2022)  Physical Activity: Inactive (09/08/2022)  Social Connections: Moderately Integrated (09/08/2022)  Stress: Stress Concern Present (09/08/2022)  Tobacco Use: Medium Risk (03/24/2023)   SDOH Interventions:     Readmission Risk Interventions     No data to display

## 2023-03-29 NOTE — Therapy (Unsigned)
OUTPATIENT PHYSICAL THERAPY THORACOLUMBAR EVALUATION   Patient Name: [name] MRN: [id number] DOB:August 02, 1946, 77 y.o., female Today's Date: 03/30/2023  END OF SESSION:  PT End of Session - 03/30/23 1024     Visit Number 2    Number of Visits 13    Date for PT Re-Evaluation 05/13/23    Authorization Type [employer] MEDICARE HMO/PPO    PT Start Time 1020    PT Stop Time 1100    PT Time Calculation (min) 40 min    Activity Tolerance Patient tolerated treatment well;Patient limited by pain    Behavior During Therapy [facility] for tasks assessed/performed              Past Medical History:  Diagnosis Date   Allergy    Anxiety    C. difficile diarrhea    history of   CAD (coronary artery disease), native coronary artery    a. 05/2017 showed 100% mLCx tx with DES, otherwise 20% mRCA, 50% ramus, 30% dLAD, 20% prox-mid LAD, 20% mLAD treated medically.    Cancer [facility])    Right kidney CA removed.    CKD (chronic kidney disease) stage 3, GFR 30-59 ml/min (HCC) 10/11/2016   s/p R nephrectomy   Colon polyps 2008   HYPERPLASTIC   COPD (chronic obstructive pulmonary disease) (HCC)    former smoker   Gastritis    GERD (gastroesophageal reflux disease)    History of echocardiogram    Echo 3/18:  Moderate LVH, EF 60-65, grade 1 diastolic dysfunction, calcified aortic valve, mild MR, moderate LAE   History of nuclear stress test    Myoview 3/18: Mod size and intensity fixed septal defect, may be artifact.Opposite mod size and intensity lat defect, which is reversible and could represent ischemia or possibly artifact (SDS 4). LVEF 71% with normal wall motion. Intermediate risk study. >> images reviewed with Dr. Dietrich Pates - no sig ischemia; med rx    Hx of cardiovascular stress test    Lexiscan Myoview 6/16:  EF 70%, no scar or ischemia; Low Risk   Hyperlipidemia    Hypertension    Neuropathy    Orthostatic hypotension 05/28/2017   OSA (obstructive sleep apnea) 01/16/2021   Osteoarthritis     Pre-diabetes    no meds   Rheumatoid arthritis (HCC)    RA (Dr. Dareen Piano) Bilateral hands   S/P angioplasty with stent 05/27/17 to LCX with DES  05/28/2017   Sleep apnea 2023   does not use CPAP   Thyroid disease    Past Surgical History:  Procedure Laterality Date   ABDOMINAL HYSTERECTOMY  1978   ANTERIOR CERVICAL DECOMP/DISCECTOMY FUSION N/A 05/29/2018   Procedure: ANTERIOR CERVICAL DECOMPRESSION FUSION - CERVICAL FIVE-CERVICAL SIX - CERVICAL SIX-CERVICAL SEVEN;  Surgeon: Julio Sicks, MD;  Location: [facility] OR;  Service: Neurosurgery;  Laterality: N/A;   BACK SURGERY     x 2   BREAST LUMPECTOMY WITH RADIOACTIVE SEED LOCALIZATION Right 04/13/2022   Procedure: RIGHT BREAST SEED LUMPECTOMY;  Surgeon: Harriette Bouillon, MD;  Location: [facility];  Service: General;  Laterality: Right;   CARPAL TUNNEL RELEASE Left    COLONOSCOPY W/ BIOPSIES AND POLYPECTOMY     Hx: of   CORONARY STENT INTERVENTION N/A 05/27/2017   Procedure: CORONARY STENT INTERVENTION;  Surgeon: Kathleene Hazel, MD;  Location: [facility] INVASIVE CV LAB;  Service: Cardiovascular;  Laterality: N/A;   ESOPHAGOGASTRODUODENOSCOPY     HNP     LEFT HEART CATH AND CORONARY ANGIOGRAPHY N/A 05/27/2017  Procedure: LEFT HEART CATH AND CORONARY ANGIOGRAPHY;  Surgeon: Kathleene Hazel, MD;  Location: [facility] INVAS[facility] CV LAB;  Service: Cardiovascular;  Laterality: N/A;   LUMBAR LAMINECTOMY/DECOMPRESSION MICRODISCECTOMY Left 02/12/2013   Procedure: LUMBAR TWO THREE, LUMBAR THREE FOUR, LUMBAR FOUR FIVE  LAMINECTOMY/DECOMPRESSION MICRODISCECTOMY 3 LEVELS;  Surgeon: Temple Pacini, MD;  Location: [facility] NEURO ORS;  Service: Neurosurgery;  Laterality: Left;   NEPHRECTOMY Right 2010   10.rcc cancer   TOTAL KNEE ARTHROPLASTY Right    Redo   Patient Active Problem List   Diagnosis Date Noted   Local reaction to COVID-19 vaccine 10/12/2022   Moderate episode of recurrent major depressive disorder (HCC) 03/24/2022   PAD (peripheral  artery disease) (HCC) 11/28/2021   OSA (obstructive sleep apnea) 01/16/2021   COPD mixed type (HCC) 01/16/2021   Fatigue 11/03/2020   B12 deficiency 11/03/2020   Chest pain 08/30/2020   Malignant neoplasm of kidney excluding renal pelvis, unspecified laterality (HCC) 09/26/2019   Vitamin D deficiency, unspecified 09/26/2019   Anxiety disorder, unspecified 03/26/2019   Tobacco use disorder 09/29/2018   Cervical myelopathy (HCC) 05/29/2018   ANA positive 12/08/2017   Cervicalgia 09/16/2017   Carpal tunnel syndrome of left wrist 07/21/2017   Chondrocalcinosis 07/21/2017   Neuropathy 07/21/2017   Orthostatic hypotension 05/28/2017   S/P angioplasty with stent 05/27/17 to LCX with DES  05/28/2017   CAD (coronary artery disease)    Polyneuropathy 05/25/2017   Chest pain with moderate risk for cardiac etiology 05/25/2017   Class 1 obesity with serious comorbidity and body mass index (BMI) of 34.0 to 34.9 in adult 12/06/2016   CKD (chronic kidney disease) stage 3, GFR 30-59 ml/min (HCC) 10/11/2016   Hot flashes, menopausal 10/01/2016   Chest pain with moderate risk of acute coronary syndrome 10/25/2014   Degenerative spondylolisthesis 10/15/2014   Spinal stenosis, lumbar region, with neurogenic claudication 02/12/2013   DM (diabetes mellitus), type 2 with renal complications (HCC) 03/05/2010   History of Rtr nephrectomy 2010, secondary to renal cell cancer 01/14/2009   PERSONAL HX COLONIC POLYPS 11/14/2008   Hyperlipidemia associated with type 2 diabetes mellitus (HCC) 11/16/2007   ESOPHAGEAL STRICTURE 10/12/2007   Hypertension with heart disease 01/19/2007   Allergic rhinitis 01/19/2007   GERD 01/19/2007    PCP: [place], [name], MD  REFERRING PROVIDER: Floreen Comber, NP  REFERRING DIAG: M54.16 (ICD-10-CM) - Radiculopathy, lumbar region   Rationale for Evaluation and Treatment: Rehabilitation  THERAPY DIAG:  Chronic right-sided low back pain with right-sided  sciatica  Muscle weakness (generalized)  Difficulty in walking, not elsewhere classified  ONSET DATE: Chronic   SUBJECTIVE:  SUBJECTIVE STATEMENT: ED for chest pain, she was cleared and did not get admitted.  Its my hip and it hurts down my leg. They would not give me an MRI because they wanted me to do some PT. XR and injection.  Neuropathy and OA. Injection didn't help.  Went to Neurosurgery and they said get an MRI.  Hip feels swollen at times.   Pt reports a chronic Hx of low back R>L and R hip pain which has been progressively getting worse since April of this year. She notes injections in the past have been helpful with decreasing her pain, however her last one did not help decrease the pain since.  PERTINENT HISTORY:  Anxiety, CAD, COPD, HTN, neuropathy, OA, Hx of multiple back surgeries, R TKR  PAIN:  Are you having pain? Yes: NPRS scale: 7/10 Pain location: low back and R hip, constant Pain description: ache Aggravating factors: Standing, walking, prolonged sitting  Relieving factors: lying down  PRECAUTIONS: None  RED FLAGS: None   WEIGHT BEARING RESTRICTIONS: No  FALLS:  Has patient fallen in last 6 months? No  LIVING ENVIRONMENT: Lives with: lives with their family Lives in: House/apartment Stairs: Yes: External: 2 steps; on left going up Has following equipment at home: Single point cane  OCCUPATION: Retired  PLOF: Independent  PATIENT GOALS: To feel better  NEXT MD VISIT: To call to schedule her next appt  OBJECTIVE:   DIAGNOSTIC FINDINGS:  X-rays demonstrate advanced osteoarthritis with significant joint space narrowing.  Large superior acetabular osteophyte                       06/09/20 DG Lumbar Spine FINDINGS: No evidence of fracture. Vertebral body heights  are maintained. No substantial subluxation. L4-L5 fusion with intervening interbody spacer. No evidence of hardware complication. Moderate degenerative disc disease at L2-L3, L3-L4, and L5-S1. Milder degenerative disc disease at the other levels. Multilevel facet arthropathy, greatest in the lower lumbar spine.   IMPRESSION: No radiographic evidence of acute fracture or traumatic malalignment. CT could provide more sensitive evaluation if clinically indicated.  PATIENT SURVEYS:  FOTO: Perceived function   30%, predicted   39%   SCREENING FOR RED FLAGS: Bowel or bladder incontinence: No Spinal tumors: No Cauda equina syndrome: No Compression fracture: No  COGNITION: Overall cognitive status: Within functional limits for tasks assessed     SENSATION: WFL  MUSCLE LENGTH: Hamstrings: Right WNLs; Left WNLs Thomas test: Right tight ; Left tight   POSTURE: rounded shoulders, forward head, anterior pelvic tilt, and flexed trunk   PALPATION: TTP R paraspinals, PSIS, and gluteal muscles  LUMBAR ROM:   AROM eval  Flexion Mod limited to mid shin; pulling pain   Extension Markedly limited; pressure pain R  Right lateral flexion Mod limited to above knee jt line; pressure pain R  Left lateral flexion Mod limited to above knee jt line; pulling pain R  Right rotation Min limited; pulling pain R  Left rotation Min limited; pulling pain R   (Blank rows = not tested)  LOWER EXTREMITY ROM:     Passive  Right eval Left eval  Hip flexion Limited pain Limited  Hip extension    Hip abduction    Hip adduction painful   Hip internal rotation Limited; pain Limited  Hip external rotation    Knee flexion    Knee extension    Ankle dorsiflexion    Ankle plantarflexion    Ankle inversion    Ankle  eversion     (Blank rows = not tested)  LOWER EXTREMITY MMT:    Weak core MMT Right eval Left eval  Hip flexion 4 pain 4+  Hip extension 4 pain 4+  Hip abduction 4 Pain 4+  Hip  adduction    Hip internal rotation    Hip external rotation 4 pain 4+  Knee flexion    Knee extension    Ankle dorsiflexion    Ankle plantarflexion    Ankle inversion    Ankle eversion     (Blank rows = not tested)  LUMBAR SPECIAL TESTS:  Straight leg raise test: Negative, SI Compression/distraction test: Negative, and FABER test: Positive  HIP SPECIAL TESTS;   Scour test: Positive  Distraction test: Positive  FUNCTIONAL TESTS:  2 MWT: TBA  GAIT: Distance walked: 246ft Assistive device utilized: Single point cane Level of assistance: Modified independence Comments: Decreased pace  TODAY'S TREATMENT:    [facility] Adult PT Treatment:                                                DATE: 03/30/23 Therapeutic Exercise: Supine knee to chest x 3 , 20 sec each side  Lower trunk rotation x 10  Hamstring stretch x 2 each side, 30 sec  Squats at sink, wide hips for mobility 2 min walk test  Self Care: Symptom mgmt, HEP and mobility, mental health benefits of exercise   Manual: LAD Rt LE x 5   Eval only                                                                                                                            PATIENT EDUCATION:  Education details: Eval findings, POC  Person educated: Patient Education method: Explanation Education comprehension: verbalized understanding  HOME EXERCISE PROGRAM: Access Code: [email or web address] URL: [email or web address] Date: 03/30/2023 Prepared by: Karie Mainland  Exercises - Supine Lower Trunk Rotation  - 1 x daily - 7 x weekly - 2 sets - 10 reps - 10 hold - Supine Single Knee to Chest  - 1 x daily - 7 x weekly - 1 sets - 5 reps - 30 hold - Supine Hamstring Stretch with Strap  - 1 x daily - 7 x weekly - 1 sets - 5 reps - 30 hold  ASSESSMENT:  CLINICAL IMPRESSION: Patient was given a simple HEP for hip mobility today and she tolerated it well.  She had an increase in hip pain to 10/10 with 2 min walk. Pt was added to the  wait list next week, encouraged to perform her HEP until her next appt if well tolerated. Rt LE long axis distraction did feel good to her.   OBJECTIVE IMPAIRMENTS: decreased activity tolerance, decreased balance, difficulty walking, decreased ROM, decreased strength, increased muscle spasms, postural dysfunction, obesity, and pain.  ACTIVITY LIMITATIONS: carrying, lifting, bending, sitting, standing, squatting, sleeping, stairs, and locomotion level  PARTICIPATION LIMITATIONS: meal prep, cleaning, laundry, and shopping  PERSONAL FACTORS: Age, Fitness, Past/current experiences, Time since onset of injury/illness/exacerbation, and 1-2 comorbidities:   neuropathy, OA, Hx of multiple back surgeries, R TKR are also affecting patient's functional outcome.   REHAB POTENTIAL: Good  CLINICAL DECISION MAKING: Evolving/moderate complexity  EVALUATION COMPLEXITY: Moderate   GOALS:  SHORT TERM GOALS: Target date: 04/15/23  Pt will be Ind in an initial HEP  Baseline: TBD Goal status: INITIAL  2.  Pt will voice understanding of measures to assist in pain reduction  Baseline:  Goal status: INITIAL   LONG TERM GOALS: Target date: 05/13/23  Pt will be Ind in a final HEP to maintain achieved LOF Baseline:  Goal status: INITIAL  2.  Increase hip strength to 4+/5 bilat for improved trunk support Baseline: see flow sheets Goal status: INITIAL  3.  Pt will be able to complete a bridge for 60 sec as demonstration of improved hip and trunk strenght Baseline: NT Goal status: INITIAL  4.  Pt's FOTO score will improved to the predicted value of 39% as indication of improved function  Baseline: 30% Goal status: INITIAL  5.  Improve by MCID of 40ft as indication of improved functional mobility  Baseline: TBA Goal status: INITIAL  PLAN:  PT FREQUENCY: 2x/week  PT DURATION: 6 weeks  PLANNED INTERVENTIONS: Therapeutic exercises, Therapeutic activity, Balance training, Gait training,  Patient/Family education, Self Care, Joint mobilization, Stair training, Aquatic Therapy, Dry Needling, Spinal mobilization, Cryotherapy, Moist heat, Taping, Ultrasound, Ionotophoresis 4mg /ml Dexamethasone, Manual therapy, and Re-evaluation.  PLAN FOR NEXT SESSION: check HEP and progress ; Review FOTO; assess response to HEP; progress therex as indicated; use of modalities, manual therapy; and TPDN as indicated.   Karie Mainland, PT 03/30/23 7:06 PM Phone: [phone] Fax: [phone]

## 2023-03-30 ENCOUNTER — Ambulatory Visit: Payer: Medicare HMO | Admitting: Physical Therapy

## 2023-03-30 ENCOUNTER — Encounter: Payer: Self-pay | Admitting: Physical Therapy

## 2023-03-30 DIAGNOSIS — R262 Difficulty in walking, not elsewhere classified: Secondary | ICD-10-CM | POA: Diagnosis not present

## 2023-03-30 DIAGNOSIS — G8929 Other chronic pain: Secondary | ICD-10-CM

## 2023-03-30 DIAGNOSIS — M6281 Muscle weakness (generalized): Secondary | ICD-10-CM | POA: Diagnosis not present

## 2023-03-30 DIAGNOSIS — H903 Sensorineural hearing loss, bilateral: Secondary | ICD-10-CM | POA: Diagnosis not present

## 2023-03-30 DIAGNOSIS — M5441 Lumbago with sciatica, right side: Secondary | ICD-10-CM | POA: Diagnosis not present

## 2023-04-04 ENCOUNTER — Other Ambulatory Visit: Payer: Self-pay | Admitting: Diagnostic Neuroimaging

## 2023-04-05 ENCOUNTER — Ambulatory Visit (INDEPENDENT_AMBULATORY_CARE_PROVIDER_SITE_OTHER): Payer: Medicare HMO | Admitting: Family Medicine

## 2023-04-05 ENCOUNTER — Encounter: Payer: Self-pay | Admitting: Family Medicine

## 2023-04-05 VITALS — BP 134/76 | HR 64 | Temp 97.9°F | Resp 16 | Ht 66.0 in | Wt 231.2 lb

## 2023-04-05 DIAGNOSIS — I119 Hypertensive heart disease without heart failure: Secondary | ICD-10-CM | POA: Diagnosis not present

## 2023-04-05 DIAGNOSIS — N1832 Chronic kidney disease, stage 3b: Secondary | ICD-10-CM

## 2023-04-05 DIAGNOSIS — E1122 Type 2 diabetes mellitus with diabetic chronic kidney disease: Secondary | ICD-10-CM

## 2023-04-05 DIAGNOSIS — R0789 Other chest pain: Secondary | ICD-10-CM

## 2023-04-05 DIAGNOSIS — N183 Chronic kidney disease, stage 3 unspecified: Secondary | ICD-10-CM | POA: Diagnosis not present

## 2023-04-05 DIAGNOSIS — F419 Anxiety disorder, unspecified: Secondary | ICD-10-CM

## 2023-04-05 LAB — POCT GLYCOSYLATED HEMOGLOBIN (HGB A1C): Hemoglobin A1C: 6.1 % — AB (ref 4.0–5.6)

## 2023-04-05 LAB — MICROALBUMIN / CREATININE URINE RATIO
Creatinine,U: 121.5 mg/dL
Microalb Creat Ratio: 1.4 mg/g (ref 0.0–30.0)
Microalb, Ur: 1.7 mg/dL (ref 0.0–1.9)

## 2023-04-05 NOTE — Assessment & Plan Note (Addendum)
With associated mild depression. Problem is not well controlled. Following with psychotherapist q 3 months. She is on Sertraline 50 mg daily, not interested in adjusting dose. Referral to psychiatrist placed, instructed to call and arrange appt.

## 2023-04-05 NOTE — Assessment & Plan Note (Signed)
BP otherwise adequately controlled. Continue metoprolol succinate 100 mg daily, losartan 100 mg daily, and hydrochlorothiazide 25 mg daily as well as DASH/low salt diet. Monitor BP at home.

## 2023-04-05 NOTE — Assessment & Plan Note (Signed)
Pain has resolved. We discussed possible causes, ? Angina, GERD, and musculoskeletal among some to consider.  Instructed about warning signs. Recent stress test negative. She has an appt with her cardiologist on 04/20/23. Instructed about warning signs.

## 2023-04-05 NOTE — Progress Notes (Signed)
HPI: [name] is a 77 y.o. female with PMHx significant for CAD status post PCI, anxiety,hypertension, hyperlipidemia, CKD, rheumatoid arthritis  here today to follow on recent hospitalization. She presented to the ED the date of admission complaining of chest pain. She was admitted on 03/24/2023 and discharged home on 03/25/2023.  EKG during initial evaluation did not show signs of acute coronary syndrome. She underwent stress test, which was negative.  She reports the chest pain has resolved since discharge.  States that pain was left-sided, not radiated,and no associated symptoms.  She states she initially felt like she had indigestion, but the pain persisted.  She believes stress from a recent conflict with her neighbor may have contributed to the chest pain.   Negative for heartburn or abdominal pain. GERD on Pepcid 20 mg bid and Pantoprazole 40 mg daily. Follows with GI.  CAD: s/p PCI, she follows with cardiologist.  HTN on hydrochlorothiazide 25 mg daily, Losartan 100 mg daily, metoprolol succinate 100 mg daily. Negative for palpitations, SOB,orthopnea,or PND.  CKD III: Negative for gross hematuria, foam in urine,or decreased urine output. [facility], states that her renal function is being followed by nephrologist.    Lab Results  Component Value Date   NA 139 03/24/2023   CL 100 03/24/2023   K 3.5 03/24/2023   CO2 24 03/24/2023   BUN 22 03/24/2023   CREATININE 1.65 (H) 03/24/2023   GFRNONAA 31 (L) 03/24/2023   CALCIUM 9.7 03/24/2023   ALBUMIN 4.1 07/05/2022   GLUCOSE 163 (H) 03/24/2023   Lab Results  Component Value Date   WBC 8.8 03/24/2023   HGB 14.3 03/24/2023   HCT 44.2 03/24/2023   MCV 88.9 03/24/2023   PLT 261 03/24/2023   Lab Results  Component Value Date   CHOL 143 09/11/2020   HDL 56 09/11/2020   LDLCALC 69 09/11/2020   LDLDIRECT 171.5 12/06/2011   TRIG 98 09/11/2020   CHOLHDL 2.6 09/11/2020   DM II: Dx'ed in  2015. She is on non pharmacologic treatment. Not checking BS's. Hx of peripheral polyneuropathy. Follows with neuro and on Gabapentin. Negative for polydipsia,polyuria, or polyphagia.  Lab Results  Component Value Date   HGBA1C 6.1 (A) 09/27/2022   Lab Results  Component Value Date   MICROALBUR 5.8 03/26/2020   MICROALBUR 16.9 (H) 09/26/2019   Anxiety and depression: She also reports episodes of feeling tense and wanting to cry about 2 times per week, and sometimes wanting to stay in her room., in bed. She is currently taking sertraline 50 mg and is comfortable with the current dosage, does not want to increase medication but inquires about seeing a psychiatrist. Daughter with hx of bipolar disorder. .    04/05/2023   10:44 AM 02/22/2023    3:04 PM 11/17/2022   11:22 AM 09/27/2022   12:23 PM 09/08/2022    2:28 PM  Depression screen PHQ 2/9  Decreased Interest 1 1 1 1 1   Down, Depressed, Hopeless 1 1 1 1 1   PHQ - 2 Score 2 2 2 2 2   Altered sleeping 1 1 1 1  0  Tired, decreased energy 2 1 2 1 1   Change in appetite 1 1 1 1  0  Feeling bad or failure about yourself  0 0 1 1 0  Trouble concentrating 1 0 1 1 0  Moving slowly or fidgety/restless 0 0 0 0 0  Suicidal thoughts 0 0 0 0 0  PHQ-9 Score 7 5 8  7  3  Difficult doing work/chores Not difficult at all Not difficult at all Somewhat difficult Somewhat difficult Not difficult at all      04/05/2023   10:45 AM 02/22/2023    3:05 PM 11/17/2022   11:23 AM 09/27/2022   12:23 PM  GAD 7 : Generalized Anxiety Score  Nervous, Anxious, on Edge 1 1 1 1   Control/stop worrying 1 0 0 0  Worry too much - different things 1 1 1 1   Trouble relaxing 0 1 1 1   Restless 0 0 0 0  Easily annoyed or irritable 1 1 1  0  Afraid - awful might happen 1 1 1  0  Total GAD 7 Score 5 5 5 3   Anxiety Difficulty Not difficult at all Not difficult at all Not difficult at all Somewhat difficult    Review of Systems  Constitutional:  Negative for activity change,  appetite change and fever.  HENT:  Negative for mouth sores, nosebleeds, sore throat and trouble swallowing.   Eyes:  Negative for redness and visual disturbance.  Respiratory:  Negative for cough and wheezing.   Gastrointestinal:  Negative for abdominal pain, nausea and vomiting.       Negative for changes in bowel habits.  Endocrine: Negative for cold intolerance and heat intolerance.  Genitourinary:  Negative for decreased urine volume, dysuria and hematuria.  Skin:  Negative for rash.  Neurological:  Negative for syncope, weakness and headaches.  Psychiatric/Behavioral:  Negative for confusion and hallucinations. The patient is nervous/anxious.   See other pertinent positives and negatives in HPI.  Current Outpatient Medications on File Prior to Visit  Medication Sig Dispense Refill   acetaminophen (TYLENOL) 500 MG tablet Take 1,000 mg by mouth as needed for moderate pain or headache.      albuterol (VENTOLIN HFA) 108 (90 Base) MCG/ACT inhaler INHALE ONE OR TWO PUFFS BY MOUTH INTO THE LUNGS EVERY SIX HOURS AS NEEDED FOR WHEEZING OR SHORTNESS OF BREATH 8.5 g 12   aspirin 81 MG chewable tablet Chew 81 mg by mouth daily.     Budeson-Glycopyrrol-Formoterol (BREZTRI AEROSPHERE) 160-9-4.8 MCG/ACT AERO Inhale 2 puffs into the lungs in the morning and at bedtime. 2 each 11   diazepam (VALIUM) 5 MG tablet Take 1 tablet by oral route 45 minutes prior to scheduled MRI. Repeat after 20 minutes if needed. Arrange transportation to and from MRI.     Evolocumab (REPATHA SURECLICK) 140 MG/ML SOAJ Inject 140 mg into the skin every 14 (fourteen) days. 2 mL 11   famotidine (PEPCID) 20 MG tablet TAKE ONE TABLET BY MOUTH EVERY MORNING and TAKE ONE TABLET BY MOUTH EVERYDAY AT BEDTIME 180 tablet 1   fluticasone (FLONASE) 50 MCG/ACT nasal spray PLACE 2 SPRAYS INTO BOTH NOSTRILS AT BEDTIME AS NEEDED FOR ALLERGIES. 48 mL 1   gabapentin (NEURONTIN) 300 MG capsule TAKE 1 CAPSULE BY MOUTH EVERY DAY AT BEDTIME 30  capsule 2   hydrochlorothiazide (HYDRODIURIL) 25 MG tablet TAKE ONE TABLET BY MOUTH ONCE DAILY 90 tablet 2   losartan (COZAAR) 100 MG tablet TAKE ONE TABLET BY MOUTH EVERY MORNING 90 tablet 2   meclizine (ANTIVERT) 25 MG tablet Take 1 tablet (25 mg total) by mouth 3 (three) times daily as needed for dizziness. 30 tablet 0   metoprolol succinate (TOPROL-XL) 100 MG 24 hr tablet TAKE ONE TABLET BY MOUTH AT NOON 90 tablet 2   montelukast (SINGULAIR) 10 MG tablet TAKE ONE TABLET BY MOUTH EVERYDAY AT BEDTIME 30 tablet 3   MYRBETRIQ  50 MG TB24 tablet Take 50 mg by mouth daily.     nitrofurantoin, macrocrystal-monohydrate, (MACROBID) 100 MG capsule Take 100 mg by mouth 2 (two) times daily.     nitroGLYCERIN (NITROSTAT) 0.4 MG SL tablet Place 1 tablet (0.4 mg total) under the tongue every 5 (five) minutes as needed for chest pain. 25 tablet 2   ondansetron (ZOFRAN-ODT) 4 MG disintegrating tablet 4mg  ODT q4 hours prn nausea/vomit 20 tablet 0   pantoprazole (PROTONIX) 40 MG tablet TAKE ONE TABLET BY MOUTH ONCE DAILY 90 tablet 3   rosuvastatin (CRESTOR) 10 MG tablet Take 1 tablet (10 mg total) by mouth daily. 90 tablet 3   sertraline (ZOLOFT) 50 MG tablet TAKE ONE TABLET BY MOUTH AT NOON 90 tablet 2   triamcinolone cream (KENALOG) 0.1 % Apply 1 Application topically 3 times/day as needed-between meals & bedtime. 30 g 1   cetirizine (ZYRTEC) 5 MG tablet Take 1 tablet (5 mg total) by mouth daily. 30 tablet 0   No current facility-administered medications on file prior to visit.    Past Medical History:  Diagnosis Date   Allergy    Anxiety    C. difficile diarrhea    history of   CAD (coronary artery disease), native coronary artery    a. 05/2017 showed 100% mLCx tx with DES, otherwise 20% mRCA, 50% ramus, 30% dLAD, 20% prox-mid LAD, 20% mLAD treated medically.    Cancer [facility])    Right kidney CA removed.    CKD (chronic kidney disease) stage 3, GFR 30-59 ml/min (HCC) 10/11/2016   s/p R nephrectomy    Colon polyps 2008   HYPERPLASTIC   COPD (chronic obstructive pulmonary disease) (HCC)    former smoker   Gastritis    GERD (gastroesophageal reflux disease)    History of echocardiogram    Echo 3/18:  Moderate LVH, EF 60-65, grade 1 diastolic dysfunction, calcified aortic valve, mild MR, moderate LAE   History of nuclear stress test    Myoview 3/18: Mod size and intensity fixed septal defect, may be artifact.Opposite mod size and intensity lat defect, which is reversible and could represent ischemia or possibly artifact (SDS 4). LVEF 71% with normal wall motion. Intermediate risk study. >> images reviewed with Dr. Dietrich Pates - no sig ischemia; med rx    Hx of cardiovascular stress test    Lexiscan Myoview 6/16:  EF 70%, no scar or ischemia; Low Risk   Hyperlipidemia    Hypertension    Neuropathy    Orthostatic hypotension 05/28/2017   OSA (obstructive sleep apnea) 01/16/2021   Osteoarthritis    Pre-diabetes    no meds   Rheumatoid arthritis (HCC)    RA (Dr. Dareen Piano) Bilateral hands   S/P angioplasty with stent 05/27/17 to LCX with DES  05/28/2017   Sleep apnea 2023   does not use CPAP   Thyroid disease    Allergies  Allergen Reactions   Oxycodone-Acetaminophen Hives and Itching    hallucinations   Percocet [Oxycodone-Acetaminophen] Hives, Itching and Other (See Comments)    hallucinations   Pravastatin Sodium Other (See Comments)    Muscle aches Muscle aches   Hydrocodone Other (See Comments)    "crazy dreams"   Aspirin Other (See Comments)    GI upset Other reaction(s): Other (See Comments) REACTION: GI upset    Social History   Socioeconomic History   Marital status: Divorced    Spouse name: Not on file   Number of children: 3   Years  of education: 12   Highest education level: Not on file  Occupational History   Occupation: Retired    Associate Professor: RETIRED  Tobacco Use   Smoking status: Former    Current packs/day: 0.00    Average packs/day: 0.2 packs/day for  53.1 years (10.6 ttl pk-yrs)    Types: Cigarettes    Start date: 65    Quit date: 09/07/2021    Years since quitting: 1.5   Smokeless tobacco: Never   Tobacco comments:    pt has been an on and off smoker since age 38  Vaping Use   Vaping status: Never Used  Substance and Sexual Activity   Alcohol use: No    Alcohol/week: 0.0 standard drinks of alcohol   Drug use: No   Sexual activity: Not Currently    Birth control/protection: Surgical    Comment: hyst  Other Topics Concern   Not on file  Social History Narrative   Single, dgtr lives with her   Current on/off smoker   Alcohol use- no   Drug use-no   Regular Exercise-yes   Social Determinants of Health   Financial Resource Strain: Low Risk  (09/08/2022)   Overall Financial Resource Strain (CARDIA)    Difficulty of Paying Living Expenses: Not hard at all  Food Insecurity: No Food Insecurity (10/20/2022)   Hunger Vital Sign    Worried About Running Out of Food in the Last Year: Never true    Ran Out of Food in the Last Year: Never true  Transportation Needs: No Transportation Needs (09/08/2022)   PRAPARE - Administrator, Civil Service (Medical): No    Lack of Transportation (Non-Medical): No  Physical Activity: Inactive (09/08/2022)   Exercise Vital Sign    Days of Exercise per Week: 0 days    Minutes of Exercise per Session: 0 min  Stress: Stress Concern Present (09/08/2022)   [employer] of Occupational Health - Occupational Stress Questionnaire    Feeling of Stress : To some extent  Social Connections: Moderately Integrated (09/08/2022)   Social Connection and Isolation Panel [NHANES]    Frequency of Communication with Friends and Family: More than three times a week    Frequency of Social Gatherings with Friends and Family: More than three times a week    Attends Religious Services: More than 4 times per year    Active Member of [employer] or Organizations: Yes    Attends Banker Meetings: More  than 4 times per year    Marital Status: Divorced    Vitals:   04/05/23 1040  BP: 134/76  Pulse: 64  Resp: 16  Temp: 97.9 F (36.6 C)  SpO2: 93%   Body mass index is 37.32 kg/m.  Physical Exam Vitals and nursing note reviewed.  Constitutional:      General: She is not in acute distress.    Appearance: She is well-developed.  HENT:     Head: Normocephalic and atraumatic.     Mouth/Throat:     Mouth: Mucous membranes are moist.     Pharynx: Oropharynx is clear.  Eyes:     Conjunctiva/sclera: Conjunctivae normal.  Cardiovascular:     Rate and Rhythm: Normal rate and regular rhythm.     Pulses:          Dorsalis pedis pulses are 2+ on the right side and 2+ on the left side.     Heart sounds: Murmur (Soft SEM RUSB) heard.  Pulmonary:     Effort:  Pulmonary effort is normal. No respiratory distress.     Breath sounds: Normal breath sounds.  Abdominal:     Palpations: Abdomen is soft. There is no mass.     Tenderness: There is no abdominal tenderness.  Skin:    General: Skin is warm.     Findings: No erythema or rash.  Neurological:     General: No focal deficit present.     Mental Status: She is alert and oriented to person, place, and time.     Cranial Nerves: No cranial nerve deficit.     Gait: Gait normal.  Psychiatric:        Mood and Affect: Mood and affect normal.   ASSESSMENT AND PLAN:  Ms. Abbigayle Naragon was seen today for hospitalization follow-up.  Diagnoses and all orders for this visit: Lab Results  Component Value Date   HGBA1C 6.1 (A) 04/05/2023   Lab Results  Component Value Date   MICROALBUR 1.7 04/05/2023   MICROALBUR 5.8 03/26/2020   Other chest pain Assessment & Plan: Pain has resolved. We discussed possible causes, ? Angina, GERD, and musculoskeletal among some to consider.  Instructed about warning signs. Recent stress test negative. She has an appt with her cardiologist on 04/20/23. Instructed about warning signs.   Type 2 diabetes  mellitus with stage 3 chronic kidney disease, without long-term current use of insulin, unspecified whether stage 3a or 3b CKD (HCC) Assessment & Plan: Problem is adequately controlled. HgA1C stable at 6.1. Continue nonpharmacologic treatment. Continue annual eye exam, periodic dental and foot care. F/U in 5-6 months.  Orders: -     POCT glycosylated hemoglobin (Hb A1C) -     Microalbumin / creatinine urine ratio; Future  Stage 3b chronic kidney disease (HCC) Assessment & Plan: Cr 1.5-1.7 and e GFT 30's.. Continue adequate hydration,BP and glucose controlled. Continue low-salt diet and NSAIDs avoidance. Currently on Losartan 100 mg daily.   Hypertension with heart disease Assessment & Plan: BP otherwise adequately controlled. Continue metoprolol succinate 100 mg daily, losartan 100 mg daily, and hydrochlorothiazide 25 mg daily as well as DASH/low salt diet. Monitor BP at home.   Anxiety disorder, unspecified type Assessment & Plan: With associated mild depression. Problem is not well controlled. Following with psychotherapist q 3 months. She is on Sertraline 50 mg daily, not interested in adjusting dose. Referral to psychiatrist placed, instructed to call and arrange appt.  Orders: -     Ambulatory referral to Psychiatry   I spent a total of 55 minutes in both face to face and non face to face activities for this visit on the date of this encounter. During this time history was obtained and documented, examination was performed, prior labs/imaging reviewed, and assessment/plan discussed.  Return if symptoms worsen or fail to improve, for keep next appointment.  Venissa Nappi G. [place], MD  [facility] [place] [facility].

## 2023-04-05 NOTE — Patient Instructions (Addendum)
A few things to remember from today's visit:  Hypertension with heart disease  Type 2 diabetes mellitus with stage 3 chronic kidney disease, without long-term current use of insulin, unspecified whether stage 3a or 3b CKD (HCC) - Plan: POC HgB A1c, Microalbumin / creatinine urine ratio  Stage 3b chronic kidney disease (HCC)  Anxiety disorder, unspecified type - Plan: Ambulatory referral to Psychiatry  Please call Behavioral Health to arrange appt with psychiatrist, referral was placed. No changes today.  If you need refills for medications you take chronically, please call your pharmacy. Do not use My Chart to request refills or for acute issues that need immediate attention. If you send a my chart message, it may take a few days to be addressed, specially if I am not in the office.  Please be sure medication list is accurate. If a new problem present, please set up appointment sooner than planned today.

## 2023-04-05 NOTE — Assessment & Plan Note (Signed)
Problem is adequately controlled. HgA1C stable at 6.1. Continue nonpharmacologic treatment. Continue annual eye exam, periodic dental and foot care. F/U in 5-6 months.

## 2023-04-05 NOTE — Assessment & Plan Note (Addendum)
Cr 1.5-1.7 and e GFT 30's.. Continue adequate hydration,BP and glucose controlled. Continue low-salt diet and NSAIDs avoidance. Currently on Losartan 100 mg daily.

## 2023-04-07 ENCOUNTER — Ambulatory Visit: Payer: Medicare HMO | Admitting: Psychology

## 2023-04-07 DIAGNOSIS — F330 Major depressive disorder, recurrent, mild: Secondary | ICD-10-CM

## 2023-04-07 NOTE — Progress Notes (Signed)
[facility] Counselor/Therapist Progress Note  Patient ID: [name], MRN: [id number],    Date: 04/07/2023  Time Spent: 2:28pm-3:30pm  pt is seen for an in person visit.     Treatment Type: Individual Therapy  Reported Symptoms: pt reports positive interactions w/ family,  talking w/ some friends, continued feeling empty some days, avoiding phone calls and interactions.     Mental Status Exam: Appearance:  Well Groomed     Behavior: Appropriate  Motor: Normal  Speech/Language:  Normal Rate  Affect: Appropriate  Mood: Some down days  Thought process: normal  Thought content:   WNL  Sensory/Perceptual disturbances:   WNL  Orientation: oriented to person, place, time/date, and situation  Attention: Good  Concentration: Good  Memory: WNL  Fund of knowledge:  Good  Insight:   Good  Judgment:  Good  Impulse Control: Good   Risk Assessment: Danger to Self:  No Self-injurious Behavior: No Danger to Others: No Duty to Warn:no Physical Aggression / Violence:No  Access to Firearms a concern: No  Gang Involvement:No   Subjective: Counselor assessed pt current functioning per pt report. Processed w/pt recent stressors and impact on mood.  Explored positives interactions and engagement with family and friends.  Assisted w/ identifying cognitive distortions that are barriers and reminding self to challenge.  Discussed homework to call a 1-2 friends a week.  Pt affect wnl.  Pt reports she was admitted to hospital for chest pain, but checked out ok and no concerns since.  Pt feels that stress of worry for daughter her recent health concerns and her neighbor being stressor caused chest pain.  Pt reports some days still struggles w/ feeling empty inside.  Pt reports she has been avoiding answering phone.  Pt reports worries about judgment or not wanting to talk.  Pt acknowledges that when does it is enjoyable and feels good.  Pt is able to challenge depressive distortions.  Pt  identified calling 1-2 friend a week for positive engagement.    Interventions: Cognitive Behavioral Therapy and behavioral activation  Diagnosis:Mild episode of recurrent major depressive disorder (HCC)  Plan: pt to f/u in 3 weeks for counseling.  Pt to f/u as scheduled w/ PCP.  Pt reports has a referral to psychiatrist for medication eval oct 2024.     Individualized Treatment Plan Strengths: enjoys time w/ daughter, son and w/ extended family and church community  Supports: her daughter   Goal/Needs for Treatment:  In order of importance to patient 1) engaged in activities/ talk w/ friends 2) decreased depressed moods 3) ---   Client Statement of Needs: "Get out of this mood I'm in. "   Treatment Level:out pt counseling  Symptoms:depressed mood, tearfulness, loss of interest, difficulty w/ motivation, withdrawn from interacting.   Client Treatment Preferences:biweekly in person counseling.  Continue medication management w/ PCP.    Healthcare consumer's goal for treatment:  Counselor, Forde Radon, [facility] will support the patient's ability to achieve the goals identified. Cognitive Behavioral Therapy, Assertive Communication/Conflict Resolution Training, Relaxation Training, ACT, Humanistic and other evidenced-based practices will be used to promote progress towards healthy functioning.   Healthcare consumer will: Actively participate in therapy, working towards healthy functioning.    *Justification for Continuation/Discontinuation of Goal: R=Revised, O=Ongoing, A=Achieved, D=Discontinued  Goal 1)  Increased engagement w/ activities she enjoys and w/ social interactions AEB pt report and counselor observation. Baseline date 12/02/22: Progress towards goal 0; How Often - Daily Target Date Goal Was reviewed Status Code Progress  towards goal/Likert rating  12/02/23                Goal 2) Increased coping w/ losses and w/ depressed moods AEB utilizing coping skills daily.    Baseline date 12/02/22: Progress towards goal 0; How Often - Daily Target Date Goal Was reviewed Status Code Progress towards goal  12/02/23                  This plan has been reviewed and created by the following participants:  This plan will be reviewed at least every 12 months. Date Behavioral Health Clinician Date Guardian/Patient   12/02/22  [facility]helia [facility] 12/02/22 Verbal Consent Provided                           Forde Radon [facility]

## 2023-04-12 NOTE — Therapy (Unsigned)
OUTPATIENT PHYSICAL THERAPY THORACOLUMBAR EVALUATION   Patient Name: [name] MRN: [id number] DOB:01-15-1946, 77 y.o., female Today's Date: 04/13/2023  END OF SESSION:  PT End of Session - 04/13/23 1556     Visit Number 3    Number of Visits 13    Date for PT Re-Evaluation 05/13/23    Authorization Type [employer] MEDICARE HMO/PPO    PT Start Time 1550    PT Stop Time 1636    PT Time Calculation (min) 46 min    Activity Tolerance Patient tolerated treatment well    Behavior During Therapy WFL for tasks assessed/performed               Past Medical History:  Diagnosis Date   Allergy    Anxiety    C. difficile diarrhea    history of   CAD (coronary artery disease), native coronary artery    a. 05/2017 showed 100% mLCx tx with DES, otherwise 20% mRCA, 50% ramus, 30% dLAD, 20% prox-mid LAD, 20% mLAD treated medically.    Cancer [facility])    Right kidney CA removed.    CKD (chronic kidney disease) stage 3, GFR 30-59 ml/min (HCC) 10/11/2016   s/p R nephrectomy   Colon polyps 2008   HYPERPLASTIC   COPD (chronic obstructive pulmonary disease) (HCC)    former smoker   Gastritis    GERD (gastroesophageal reflux disease)    History of echocardiogram    Echo 3/18:  Moderate LVH, EF 60-65, grade 1 diastolic dysfunction, calcified aortic valve, mild MR, moderate LAE   History of nuclear stress test    Myoview 3/18: Mod size and intensity fixed septal defect, may be artifact.Opposite mod size and intensity lat defect, which is reversible and could represent ischemia or possibly artifact (SDS 4). LVEF 71% with normal wall motion. Intermediate risk study. >> images reviewed with Dr. Dietrich Pates - no sig ischemia; med rx    Hx of cardiovascular stress test    Lexiscan Myoview 6/16:  EF 70%, no scar or ischemia; Low Risk   Hyperlipidemia    Hypertension    Neuropathy    Orthostatic hypotension 05/28/2017   OSA (obstructive sleep apnea) 01/16/2021   Osteoarthritis    Pre-diabetes     no meds   Rheumatoid arthritis (HCC)    RA (Dr. Dareen Piano) Bilateral hands   S/P angioplasty with stent 05/27/17 to LCX with DES  05/28/2017   Sleep apnea 2023   does not use CPAP   Thyroid disease    Past Surgical History:  Procedure Laterality Date   ABDOMINAL HYSTERECTOMY  1978   ANTERIOR CERVICAL DECOMP/DISCECTOMY FUSION N/A 05/29/2018   Procedure: ANTERIOR CERVICAL DECOMPRESSION FUSION - CERVICAL FIVE-CERVICAL SIX - CERVICAL SIX-CERVICAL SEVEN;  Surgeon: Julio Sicks, MD;  Location: [facility] OR;  Service: Neurosurgery;  Laterality: N/A;   BACK SURGERY     x 2   BREAST LUMPECTOMY WITH RADIOACTIVE SEED LOCALIZATION Right 04/13/2022   Procedure: RIGHT BREAST SEED LUMPECTOMY;  Surgeon: Harriette Bouillon, MD;  Location: [facility];  Service: General;  Laterality: Right;   CARPAL TUNNEL RELEASE Left    COLONOSCOPY W/ BIOPSIES AND POLYPECTOMY     Hx: of   CORONARY STENT INTERVENTION N/A 05/27/2017   Procedure: CORONARY STENT INTERVENTION;  Surgeon: Kathleene Hazel, MD;  Location: [facility];  Service: Cardiovascular;  Laterality: N/A;   ESOPHAGOGASTRODUODENOSCOPY     HNP     LEFT HEART CATH AND CORONARY ANGIOGRAPHY N/A 05/27/2017  Procedure: LEFT HEART CATH AND CORONARY ANGIOGRAPHY;  Surgeon: Kathleene Hazel, MD;  Location: [facility];  Service: Cardiovascular;  Laterality: N/A;   LUMBAR LAMINECTOMY/DECOMPRESSION MICRODISCECTOMY Left 02/12/2013   Procedure: LUMBAR TWO THREE, LUMBAR THREE FOUR, LUMBAR FOUR FIVE  LAMINECTOMY/DECOMPRESSION MICRODISCECTOMY 3 LEVELS;  Surgeon: Temple Pacini, MD;  Location: [facility];  Service: Neurosurgery;  Laterality: Left;   NEPHRECTOMY Right 2010   10.rcc cancer   TOTAL KNEE ARTHROPLASTY Right    Redo   Patient Active Problem List   Diagnosis Date Noted   Local reaction to COVID-19 vaccine 10/12/2022   Moderate episode of recurrent major depressive disorder (HCC) 03/24/2022   PAD (peripheral artery disease)  (HCC) 11/28/2021   OSA (obstructive sleep apnea) 01/16/2021   COPD mixed type (HCC) 01/16/2021   Fatigue 11/03/2020   B12 deficiency 11/03/2020   Chest pain 08/30/2020   Malignant neoplasm of kidney excluding renal pelvis, unspecified laterality (HCC) 09/26/2019   Vitamin D deficiency, unspecified 09/26/2019   Anxiety disorder, unspecified 03/26/2019   Tobacco use disorder 09/29/2018   Cervical myelopathy (HCC) 05/29/2018   ANA positive 12/08/2017   Cervicalgia 09/16/2017   Carpal tunnel syndrome of left wrist 07/21/2017   Chondrocalcinosis 07/21/2017   Neuropathy 07/21/2017   Orthostatic hypotension 05/28/2017   S/P angioplasty with stent 05/27/17 to LCX with DES  05/28/2017   CAD (coronary artery disease)    Polyneuropathy 05/25/2017   Chest pain with moderate risk for cardiac etiology 05/25/2017   Class 1 obesity with serious comorbidity and body mass index (BMI) of 34.0 to 34.9 in adult 12/06/2016   CKD (chronic kidney disease) stage 3, GFR 30-59 ml/min (HCC) 10/11/2016   Hot flashes, menopausal 10/01/2016   Chest pain with moderate risk of acute coronary syndrome 10/25/2014   Degenerative spondylolisthesis 10/15/2014   Spinal stenosis, lumbar region, with neurogenic claudication 02/12/2013   DM (diabetes mellitus), type 2 with renal complications (HCC) 03/05/2010   History of Rtr nephrectomy 2010, secondary to renal cell cancer 01/14/2009   PERSONAL HX COLONIC POLYPS 11/14/2008   Hyperlipidemia associated with type 2 diabetes mellitus (HCC) 11/16/2007   ESOPHAGEAL STRICTURE 10/12/2007   Hypertension with heart disease 01/19/2007   Allergic rhinitis 01/19/2007   GERD 01/19/2007    PCP: [place], [name], MD  REFERRING PROVIDER: Floreen Comber, NP  REFERRING DIAG: M54.16 (ICD-10-CM) - Radiculopathy, lumbar region   Rationale for Evaluation and Treatment: Rehabilitation  THERAPY DIAG:  Chronic right-sided low back pain with right-sided sciatica  Muscle weakness  (generalized)  Difficulty in walking, not elsewhere classified  ONSET DATE: Chronic   SUBJECTIVE:  SUBJECTIVE STATEMENT: Pt reports pain today.  She has pain 7/10. She lost her exercises and did not do them, unfortunately.   Pt reports a chronic Hx of low back R>L and R hip pain which has been progressively getting worse since April of this year. She notes injections in the past have been helpful with decreasing her pain, however her last one did not help decrease the pain since.  PERTINENT HISTORY:  Anxiety, CAD, COPD, HTN, neuropathy, OA, Hx of multiple back surgeries, R TKR  PAIN:  Are you having pain? Yes: NPRS scale: 7/10 Pain location: low back and R hip, constant Pain description: ache Aggravating factors: Standing, walking, prolonged sitting  Relieving factors: lying down  PRECAUTIONS: None  RED FLAGS: None   WEIGHT BEARING RESTRICTIONS: No  FALLS:  Has patient fallen in last 6 months? No  LIVING ENVIRONMENT: Lives with: lives with their family Lives in: House/apartment Stairs: Yes: External: 2 steps; on left going up Has following equipment at home: Single point cane  OCCUPATION: Retired  PLOF: Independent  PATIENT GOALS: To feel better  NEXT MD VISIT: To call to schedule her next appt  OBJECTIVE:   DIAGNOSTIC FINDINGS:  X-rays demonstrate advanced osteoarthritis with significant joint space narrowing.  Large superior acetabular osteophyte                       06/09/20 DG Lumbar Spine FINDINGS: No evidence of fracture. Vertebral body heights are maintained. No substantial subluxation. L4-L5 fusion with intervening interbody spacer. No evidence of hardware complication. Moderate degenerative disc disease at L2-L3, L3-L4, and L5-S1. Milder degenerative disc disease at  the other levels. Multilevel facet arthropathy, greatest in the lower lumbar spine.   IMPRESSION: No radiographic evidence of acute fracture or traumatic malalignment. CT could provide more sensitive evaluation if clinically indicated.  PATIENT SURVEYS:  FOTO: Perceived function   30%, predicted   39%   SCREENING FOR RED FLAGS: Bowel or bladder incontinence: No Spinal tumors: No Cauda equina syndrome: No Compression fracture: No  COGNITION: Overall cognitive status: Within functional limits for tasks assessed     SENSATION: WFL  MUSCLE LENGTH: Hamstrings: Right WNLs; Left WNLs Thomas test: Right tight ; Left tight   POSTURE: rounded shoulders, forward head, anterior pelvic tilt, and flexed trunk   PALPATION: TTP R paraspinals, PSIS, and gluteal muscles  LUMBAR ROM:   AROM eval  Flexion Mod limited to mid shin; pulling pain   Extension Markedly limited; pressure pain R  Right lateral flexion Mod limited to above knee jt line; pressure pain R  Left lateral flexion Mod limited to above knee jt line; pulling pain R  Right rotation Min limited; pulling pain R  Left rotation Min limited; pulling pain R   (Blank rows = not tested)  LOWER EXTREMITY ROM:     Passive  Right eval Left eval  Hip flexion Limited pain Limited  Hip extension    Hip abduction    Hip adduction painful   Hip internal rotation Limited; pain Limited  Hip external rotation    Knee flexion    Knee extension    Ankle dorsiflexion    Ankle plantarflexion    Ankle inversion    Ankle eversion     (Blank rows = not tested)  LOWER EXTREMITY MMT:    Weak core MMT Right eval Left eval  Hip flexion 4 pain 4+  Hip extension 4 pain 4+  Hip abduction 4 Pain 4+  Hip  adduction    Hip internal rotation    Hip external rotation 4 pain 4+  Knee flexion    Knee extension    Ankle dorsiflexion    Ankle plantarflexion    Ankle inversion    Ankle eversion     (Blank rows = not tested)  LUMBAR  SPECIAL TESTS:  Straight leg raise test: Negative, SI Compression/distraction test: Negative, and FABER test: Positive  HIP SPECIAL TESTS;   Scour test: Positive  Distraction test: Positive  FUNCTIONAL TESTS:  2 MWT: **  GAIT: Distance walked: 273ft Assistive device utilized: Single point cane Level of assistance: Modified independence Comments: Decreased pace  TODAY'S TREATMENT:   [facility] Adult PT Treatment:                                                DATE: 04/13/23 Therapeutic Exercise: NuStep L4 UE and LE for 5 min  Supine knee to chest x 2 , 20 sec each side  Lower trunk rotation x 10  Hamstring stretch x 2 each side, 30 sec  Hamstring curl x 10 with ball  Ball bridge with ball under knees x 10  Banded bridge x 10 green band  Knee to chest to reset  March green band x 10 with ab set  2 min walk 301 feet pain 9/10     [facility] Adult PT Treatment:                                                DATE: 03/30/23 Therapeutic Exercise: Supine knee to chest x 3 , 20 sec each side  Lower trunk rotation x 10  Hamstring stretch x 2 each side, 30 sec  Squats at sink, wide hips for mobility 2 min walk test  Self Care: Symptom mgmt, HEP and mobility, mental health benefits of exercise   Manual: LAD Rt LE x 5   Eval only                                                                                                                            PATIENT EDUCATION:  Education details: Eval findings, POC  Person educated: Patient Education method: Explanation Education comprehension: verbalized understanding  HOME EXERCISE PROGRAM: Access Code: [email or web address] URL: [email or web address] Date: 03/30/2023 Prepared by: Karie Mainland  Exercises - Supine Lower Trunk Rotation  - 1 x daily - 7 x weekly - 2 sets - 10 reps - 10 hold - Supine Single Knee to Chest  - 1 x daily - 7 x weekly - 1 sets - 5 reps - 30 hold - Supine Hamstring Stretch with Strap  - 1 x daily - 7 x weekly - 1  sets -  5 reps - 30 hold - bridge  -banded clam   ASSESSMENT:  CLINICAL IMPRESSION: Patient re-issued HEP for basic stretches and she did well.  She had pain with walking up to 9/10 in back, hips.  She my consider getting a ball for the bridge exercises.  Continue POC.   OBJECTIVE IMPAIRMENTS: decreased activity tolerance, decreased balance, difficulty walking, decreased ROM, decreased strength, increased muscle spasms, postural dysfunction, obesity, and pain.   ACTIVITY LIMITATIONS: carrying, lifting, bending, sitting, standing, squatting, sleeping, stairs, and locomotion level  PARTICIPATION LIMITATIONS: meal prep, cleaning, laundry, and shopping  PERSONAL FACTORS: Age, Fitness, Past/current experiences, Time since onset of injury/illness/exacerbation, and 1-2 comorbidities:   neuropathy, OA, Hx of multiple back surgeries, R TKR are also affecting patient's functional outcome.   REHAB POTENTIAL: Good  CLINICAL DECISION MAKING: Evolving/moderate complexity  EVALUATION COMPLEXITY: Moderate   GOALS:  SHORT TERM GOALS: Target date: 04/15/23  Pt will be Ind in an initial HEP  Baseline: TBD Goal status: ongoing   2.  Pt will voice understanding of measures to assist in pain reduction  Baseline:  Goal status: ongoing    LONG TERM GOALS: Target date: 05/13/23  Pt will be Ind in a final HEP to maintain achieved LOF Baseline:  Goal status: INITIAL  2.  Increase hip strength to 4+/5 bilat for improved trunk support Baseline: see flow sheets Goal status: INITIAL  3.  Pt will be able to complete a bridge for 60 sec as demonstration of improved hip and trunk strenght Baseline: NT Goal status: INITIAL  4.  Pt's FOTO score will improved to the predicted value of 39% as indication of improved function  Baseline: 30% Goal status: INITIAL  5.  Improve by MC[facility] of 76ft as indication of improved functional mobility  Baseline:301 feet  Goal status: INITIAL  PLAN:  PT  FREQUENCY: 2x/week  PT DURATION: 6 weeks  PLANNED INTERVENTIONS: Therapeutic exercises, Therapeutic activity, Balance training, Gait training, Patient/Family education, Self Care, Joint mobilization, Stair training, Aquatic Therapy, Dry Needling, Spinal mobilization, Cryotherapy, Moist heat, Taping, Ultrasound, Ionotophoresis 4mg /ml Dexamethasone, Manual therapy, and Re-evaluation.  PLAN FOR NEXT SESSION: check HEP and progress ; Review FOTO; assess response to HEP; progress therex as indicated; use of modalities, manual therapy; and TPDN as indicated.   Karie Mainland, PT 04/13/23 4:46 PM Phone: [phone] Fax: [phone]

## 2023-04-13 ENCOUNTER — Ambulatory Visit: Payer: Medicare HMO | Attending: Student | Admitting: Physical Therapy

## 2023-04-13 ENCOUNTER — Encounter: Payer: Self-pay | Admitting: Physical Therapy

## 2023-04-13 DIAGNOSIS — M6281 Muscle weakness (generalized): Secondary | ICD-10-CM | POA: Diagnosis not present

## 2023-04-13 DIAGNOSIS — R262 Difficulty in walking, not elsewhere classified: Secondary | ICD-10-CM | POA: Diagnosis not present

## 2023-04-13 DIAGNOSIS — G8929 Other chronic pain: Secondary | ICD-10-CM | POA: Insufficient documentation

## 2023-04-13 DIAGNOSIS — M5441 Lumbago with sciatica, right side: Secondary | ICD-10-CM | POA: Insufficient documentation

## 2023-04-15 ENCOUNTER — Ambulatory Visit: Payer: Medicare HMO

## 2023-04-15 DIAGNOSIS — M6281 Muscle weakness (generalized): Secondary | ICD-10-CM

## 2023-04-15 DIAGNOSIS — G8929 Other chronic pain: Secondary | ICD-10-CM | POA: Diagnosis not present

## 2023-04-15 DIAGNOSIS — R262 Difficulty in walking, not elsewhere classified: Secondary | ICD-10-CM

## 2023-04-15 DIAGNOSIS — M5441 Lumbago with sciatica, right side: Secondary | ICD-10-CM | POA: Diagnosis not present

## 2023-04-15 NOTE — Therapy (Signed)
OUTPATIENT PHYSICAL THERAPY THORACOLUMBAR EVALUATION   Patient Name: [name] MRN: [id number] DOB:08-22-1945, 77 y.o., female Today's Date: 04/15/2023  END OF SESSION:  PT End of Session - 04/15/23 1103     Visit Number 4    Number of Visits 13    Date for PT Re-Evaluation 05/13/23    Authorization Type [employer] MEDICARE HMO/PPO    Authorization - Visit Number 3    Authorization - Number of Visits 27    PT Start Time 1103    PT Stop Time 1145    PT Time Calculation (min) 42 min    Activity Tolerance Patient tolerated treatment well    Behavior During Therapy WFL for tasks assessed/performed                Past Medical History:  Diagnosis Date   Allergy    Anxiety    C. difficile diarrhea    history of   CAD (coronary artery disease), native coronary artery    a. 05/2017 showed 100% mLCx tx with DES, otherwise 20% mRCA, 50% ramus, 30% dLAD, 20% prox-mid LAD, 20% mLAD treated medically.    Cancer [facility])    Right kidney CA removed.    CKD (chronic kidney disease) stage 3, GFR 30-59 ml/min (HCC) 10/11/2016   s/p R nephrectomy   Colon polyps 2008   HYPERPLASTIC   COPD (chronic obstructive pulmonary disease) (HCC)    former smoker   Gastritis    GERD (gastroesophageal reflux disease)    History of echocardiogram    Echo 3/18:  Moderate LVH, EF 60-65, grade 1 diastolic dysfunction, calcified aortic valve, mild MR, moderate LAE   History of nuclear stress test    Myoview 3/18: Mod size and intensity fixed septal defect, may be artifact.Opposite mod size and intensity lat defect, which is reversible and could represent ischemia or possibly artifact (SDS 4). LVEF 71% with normal wall motion. Intermediate risk study. >> images reviewed with Dr. Dietrich Pates - no sig ischemia; med rx    Hx of cardiovascular stress test    Lexiscan Myoview 6/16:  EF 70%, no scar or ischemia; Low Risk   Hyperlipidemia    Hypertension    Neuropathy    Orthostatic hypotension 05/28/2017   OSA  (obstructive sleep apnea) 01/16/2021   Osteoarthritis    Pre-diabetes    no meds   Rheumatoid arthritis (HCC)    RA (Dr. Dareen Piano) Bilateral hands   S/P angioplasty with stent 05/27/17 to LCX with DES  05/28/2017   Sleep apnea 2023   does not use CPAP   Thyroid disease    Past Surgical History:  Procedure Laterality Date   ABDOMINAL HYSTERECTOMY  1978   ANTERIOR CERVICAL DECOMP/DISCECTOMY FUSION N/A 05/29/2018   Procedure: ANTERIOR CERVICAL DECOMPRESSION FUSION - CERVICAL FIVE-CERVICAL SIX - CERVICAL SIX-CERVICAL SEVEN;  Surgeon: Julio Sicks, MD;  Location: [facility] OR;  Service: Neurosurgery;  Laterality: N/A;   BACK SURGERY     x 2   BREAST LUMPECTOMY WITH RADIOACTIVE SEED LOCALIZATION Right 04/13/2022   Procedure: RIGHT BREAST SEED LUMPECTOMY;  Surgeon: Harriette Bouillon, MD;  Location: [facility];  Service: General;  Laterality: Right;   CARPAL TUNNEL RELEASE Left    COLONOSCOPY W/ BIOPSIES AND POLYPECTOMY     Hx: of   CORONARY STENT INTERVENTION N/A 05/27/2017   Procedure: CORONARY STENT INTERVENTION;  Surgeon: Kathleene Hazel, MD;  Location: [facility] INVASIVE [facility];  Service: Cardiovascular;  Laterality: N/A;   ESOPHAGOGASTRODUODENOSCOPY  HNP     LEFT HEART CATH AND CORONARY ANGIOGRAPHY N/A 05/27/2017   Procedure: LEFT HEART CATH AND CORONARY ANGIOGRAPHY;  Surgeon: Kathleene Hazel, MD;  Location: [facility];  Service: Cardiovascular;  Laterality: N/A;   LUMBAR LAMINECTOMY/DECOMPRESSION MICRODISCECTOMY Left 02/12/2013   Procedure: LUMBAR TWO THREE, LUMBAR THREE FOUR, LUMBAR FOUR FIVE  LAMINECTOMY/DECOMPRESSION MICRODISCECTOMY 3 LEVELS;  Surgeon: Temple Pacini, MD;  Location: [facility];  Service: Neurosurgery;  Laterality: Left;   NEPHRECTOMY Right 2010   10.rcc cancer   TOTAL KNEE ARTHROPLASTY Right    Redo   Patient Active Problem List   Diagnosis Date Noted   Local reaction to COVID-19 vaccine 10/12/2022   Moderate episode of recurrent major  depressive disorder (HCC) 03/24/2022   PAD (peripheral artery disease) (HCC) 11/28/2021   OSA (obstructive sleep apnea) 01/16/2021   COPD mixed type (HCC) 01/16/2021   Fatigue 11/03/2020   B12 deficiency 11/03/2020   Chest pain 08/30/2020   Malignant neoplasm of kidney excluding renal pelvis, unspecified laterality (HCC) 09/26/2019   Vitamin D deficiency, unspecified 09/26/2019   Anxiety disorder, unspecified 03/26/2019   Tobacco use disorder 09/29/2018   Cervical myelopathy (HCC) 05/29/2018   ANA positive 12/08/2017   Cervicalgia 09/16/2017   Carpal tunnel syndrome of left wrist 07/21/2017   Chondrocalcinosis 07/21/2017   Neuropathy 07/21/2017   Orthostatic hypotension 05/28/2017   S/P angioplasty with stent 05/27/17 to LCX with DES  05/28/2017   CAD (coronary artery disease)    Polyneuropathy 05/25/2017   Chest pain with moderate risk for cardiac etiology 05/25/2017   Class 1 obesity with serious comorbidity and body mass index (BMI) of 34.0 to 34.9 in adult 12/06/2016   CKD (chronic kidney disease) stage 3, GFR 30-59 ml/min (HCC) 10/11/2016   Hot flashes, menopausal 10/01/2016   Chest pain with moderate risk of acute coronary syndrome 10/25/2014   Degenerative spondylolisthesis 10/15/2014   Spinal stenosis, lumbar region, with neurogenic claudication 02/12/2013   DM (diabetes mellitus), type 2 with renal complications (HCC) 03/05/2010   History of Rtr nephrectomy 2010, secondary to renal cell cancer 01/14/2009   PERSONAL HX COLONIC POLYPS 11/14/2008   Hyperlipidemia associated with type 2 diabetes mellitus (HCC) 11/16/2007   ESOPHAGEAL STRICTURE 10/12/2007   Hypertension with heart disease 01/19/2007   Allergic rhinitis 01/19/2007   GERD 01/19/2007    PCP: [place], [name], MD  REFERRING PROVIDER: Floreen Comber, NP  REFERRING DIAG: M54.16 (ICD-10-CM) - Radiculopathy, lumbar region   Rationale for Evaluation and Treatment: Rehabilitation  THERAPY DIAG:  Chronic  right-sided low back pain with right-sided sciatica  Muscle weakness (generalized)  Difficulty in walking, not elsewhere classified  ONSET DATE: Chronic   SUBJECTIVE:  SUBJECTIVE STATEMENT: Pt reports 7/10 pain.  Pt reports a chronic Hx of low back R>L and R hip pain which has been progressively getting worse since April of this year. She notes injections in the past have been helpful with decreasing her pain, however her last one did not help decrease the pain since.  PERTINENT HISTORY:  Anxiety, CAD, COPD, HTN, neuropathy, OA, Hx of multiple back surgeries, R TKR  PAIN:  Are you having pain? Yes: NPRS scale: 7/10 Pain location: low back and R hip, constant Pain description: ache Aggravating factors: Standing, walking, prolonged sitting  Relieving factors: lying down  PRECAUTIONS: None  RED FLAGS: None   WEIGHT BEARING RESTRICTIONS: No  FALLS:  Has patient fallen in last 6 months? No  LIVING ENVIRONMENT: Lives with: lives with their family Lives in: House/apartment Stairs: Yes: External: 2 steps; on left going up Has following equipment at home: Single point cane  OCCUPATION: Retired  PLOF: Independent  PATIENT GOALS: To feel better  NEXT MD VISIT: To call to schedule her next appt  OBJECTIVE:   DIAGNOSTIC FINDINGS:  X-rays demonstrate advanced osteoarthritis with significant joint space narrowing.  Large superior acetabular osteophyte                       06/09/20 DG Lumbar Spine FINDINGS: No evidence of fracture. Vertebral body heights are maintained. No substantial subluxation. L4-L5 fusion with intervening interbody spacer. No evidence of hardware complication. Moderate degenerative disc disease at L2-L3, L3-L4, and L5-S1. Milder degenerative disc disease at the other  levels. Multilevel facet arthropathy, greatest in the lower lumbar spine.   IMPRESSION: No radiographic evidence of acute fracture or traumatic malalignment. CT could provide more sensitive evaluation if clinically indicated.  PATIENT SURVEYS:  FOTO: Perceived function   30%, predicted   39%   SCREENING FOR RED FLAGS: Bowel or bladder incontinence: No Spinal tumors: No Cauda equina syndrome: No Compression fracture: No  COGNITION: Overall cognitive status: Within functional limits for tasks assessed     SENSATION: WFL  MUSCLE LENGTH: Hamstrings: Right WNLs; Left WNLs Thomas test: Right tight ; Left tight   POSTURE: rounded shoulders, forward head, anterior pelvic tilt, and flexed trunk   PALPATION: TTP R paraspinals, PSIS, and gluteal muscles  LUMBAR ROM:   AROM eval  Flexion Mod limited to mid shin; pulling pain   Extension Markedly limited; pressure pain R  Right lateral flexion Mod limited to above knee jt line; pressure pain R  Left lateral flexion Mod limited to above knee jt line; pulling pain R  Right rotation Min limited; pulling pain R  Left rotation Min limited; pulling pain R   (Blank rows = not tested)  LOWER EXTREMITY ROM:     Passive  Right eval Left eval  Hip flexion Limited pain Limited  Hip extension    Hip abduction    Hip adduction painful   Hip internal rotation Limited; pain Limited  Hip external rotation    Knee flexion    Knee extension    Ankle dorsiflexion    Ankle plantarflexion    Ankle inversion    Ankle eversion     (Blank rows = not tested)  LOWER EXTREMITY MMT:    Weak core MMT Right eval Left eval  Hip flexion 4 pain 4+  Hip extension 4 pain 4+  Hip abduction 4 Pain 4+  Hip adduction    Hip internal rotation    Hip external rotation 4 pain 4+  Knee flexion    Knee extension    Ankle dorsiflexion    Ankle plantarflexion    Ankle inversion    Ankle eversion     (Blank rows = not tested)  LUMBAR SPECIAL  TESTS:  Straight leg raise test: Negative, SI Compression/distraction test: Negative, and FABER test: Positive  HIP SPECIAL TESTS;   Scour test: Positive  Distraction test: Positive  FUNCTIONAL TESTS:  2 MWT: 301'  GAIT: Distance walked: 210ft Assistive device utilized: Single point cane Level of assistance: Modified independence Comments: Decreased pace  TODAY'S TREATMENT:   [facility] Adult PT Treatment:                                                DATE: 04/15/23 Therapeutic Exercise: Supine knee to chest x 2 , 20 sec each side  Lower trunk rotation x 10  Hamstring stretch x 2 each side, 30 sec  Hamstring curl x 10 with ball  Ball bridge with ball under knees x 10  Banded bridge x 10 green band  H/L hip clam x15 green band March green band x 10 with ab set  Updated HEP Manual Therapy: STM and MTPR to the R gluteal muscles Self Care: Education for use of SPC in proper hand. Height of PSC adjusted.  [facility] Adult PT Treatment:                                                DATE: 04/13/23 Therapeutic Exercise: NuStep L4 UE and LE for 5 min  Supine knee to chest x 2 , 20 sec each side  Lower trunk rotation x 10  Hamstring stretch x 2 each side, 30 sec  Hamstring curl x 10 with ball  Ball bridge with ball under knees x 10  Banded bridge x 10 green band  Knee to chest to reset  March green band x 10 with ab set  2 min walk 301 feet pain 9/10    PATIENT EDUCATION:  Education details: Eval findings, POC  Person educated: Patient Education method: Explanation Education comprehension: verbalized understanding  HOME EXERCISE PROGRAM: Access Code: [email or web address] URL: [email or web address] Date: 04/15/2023 Prepared by: Joellyn Rued  Exercises - Supine Lower Trunk Rotation  - 1 x daily - 7 x weekly - 2 sets - 10 reps - 10 hold - Supine Single Knee to Chest  - 1 x daily - 7 x weekly - 1 sets - 3 reps - 30 hold - Supine Hamstring Stretch with Strap  - 1 x daily - 7 x  weekly - 1 sets - 3 reps - 30 hold - Supine Piriformis Stretch with Foot on Ground  - 1 x daily - 7 x weekly - 1 sets - 3 reps - 30 hold - Beginner Bridge  - 1 x daily - 7 x weekly - 2 sets - 10 reps - 5 hold - Hooklying Clamshell with Resistance  - 1 x daily - 7 x weekly - 2 sets - 10 reps - 5 hold  ASSESSMENT:  CLINICAL IMPRESSION: Completed PT for proper use of SPC to assist c wt bearing when walking. STM c MTPR was provided to the R gluteal region, TrPs /taut muscle were identified. Pt then completed  lumbopelvic flexibility and strengthening. Pt was encouraged to complete her HEP daily. Pt tolerated the prescribed therex today without adverse effects. Discussed TPDN for the treatment of her R gluteal pain with TrPs and taut muscle bands. Encouraged pt to complete her HEP consistently. Pt will continue to benefit from skilled PT to address impairments for improved function   OBJECTIVE IMPAIRMENTS: decreased activity tolerance, decreased balance, difficulty walking, decreased ROM, decreased strength, increased muscle spasms, postural dysfunction, obesity, and pain.   ACTIVITY LIMITATIONS: carrying, lifting, bending, sitting, standing, squatting, sleeping, stairs, and locomotion level  PARTICIPATION LIMITATIONS: meal prep, cleaning, laundry, and shopping  PERSONAL FACTORS: Age, Fitness, Past/current experiences, Time since onset of injury/illness/exacerbation, and 1-2 comorbidities:   neuropathy, OA, Hx of multiple back surgeries, R TKR are also affecting patient's functional outcome.   REHAB POTENTIAL: Good  CLINICAL DECISION MAKING: Evolving/moderate complexity  EVALUATION COMPLEXITY: Moderate   GOALS:  SHORT TERM GOALS: Target date: 04/15/23  Pt will be Ind in an initial HEP  Baseline: TBD Goal status: ongoing   2.  Pt will voice understanding of measures to assist in pain reduction  Baseline:  04/15/23: Pt is using a SPC for wt bearing assist Goal status: ongoing    LONG  TERM GOALS: Target date: 05/13/23  Pt will be Ind in a final HEP to maintain achieved LOF Baseline:  Goal status: INITIAL  2.  Increase hip strength to 4+/5 bilat for improved trunk support Baseline: see flow sheets Goal status: INITIAL  3.  Pt will be able to complete a bridge for 60 sec as demonstration of improved hip and trunk strenght Baseline: NT Goal status: INITIAL  4.  Pt's FOTO score will improved to the predicted value of 39% as indication of improved function  Baseline: 30% Goal status: INITIAL  5.  Improve by MCID of 61ft as indication of improved functional mobility  Baseline:301 feet  Goal status: INITIAL  PLAN:  PT FREQUENCY: 2x/week  PT DURATION: 6 weeks  PLANNED INTERVENTIONS: Therapeutic exercises, Therapeutic activity, Balance training, Gait training, Patient/Family education, Self Care, Joint mobilization, Stair training, Aquatic Therapy, Dry Needling, Spinal mobilization, Cryotherapy, Moist heat, Taping, Ultrasound, Ionotophoresis 4mg /ml Dexamethasone, Manual therapy, and Re-evaluation.  PLAN FOR NEXT SESSION: check HEP and progress ; Review FOTO; assess response to HEP; progress therex as indicated; use of modalities, manual therapy; and TPDN as indicated.   Aliviah Spain MS, PT 04/15/23 12:15 PM

## 2023-04-18 NOTE — Progress Notes (Unsigned)
Cardiology Office Note    Patient Name: [name] Date of Encounter: 04/18/2023  Primary Care Provider:  [place], [name], MD Primary Cardiologist:  Dietrich Pates, MD Primary Electrophysiologist: None   Past Medical History    Past Medical History:  Diagnosis Date   Allergy    Anxiety    C. difficile diarrhea    history of   CAD (coronary artery disease), native coronary artery    a. 05/2017 showed 100% mLCx tx with DES, otherwise 20% mRCA, 50% ramus, 30% dLAD, 20% prox-mid LAD, 20% mLAD treated medically.    Cancer [facility])    Right kidney CA removed.    CKD (chronic kidney disease) stage 3, GFR 30-59 ml/min (HCC) 10/11/2016   s/p R nephrectomy   Colon polyps 2008   HYPERPLASTIC   COPD (chronic obstructive pulmonary disease) (HCC)    former smoker   Gastritis    GERD (gastroesophageal reflux disease)    History of echocardiogram    Echo 3/18:  Moderate LVH, EF 60-65, grade 1 diastolic dysfunction, calcified aortic valve, mild MR, moderate LAE   History of nuclear stress test    Myoview 3/18: Mod size and intensity fixed septal defect, may be artifact.Opposite mod size and intensity lat defect, which is reversible and could represent ischemia or possibly artifact (SDS 4). LVEF 71% with normal wall motion. Intermediate risk study. >> images reviewed with Dr. Dietrich Pates - no sig ischemia; med rx    Hx of cardiovascular stress test    Lexiscan Myoview 6/16:  EF 70%, no scar or ischemia; Low Risk   Hyperlipidemia    Hypertension    Neuropathy    Orthostatic hypotension 05/28/2017   OSA (obstructive sleep apnea) 01/16/2021   Osteoarthritis    Pre-diabetes    no meds   Rheumatoid arthritis (HCC)    RA (Dr. Dareen Piano) Bilateral hands   S/P angioplasty with stent 05/27/17 to LCX with DES  05/28/2017   Sleep apnea 2023   does not use CPAP   Thyroid disease     History of Present Illness  [name] is a 77 y.o. female with a PMH of***   During today's visit the patient  reports*** .  Patient denies chest pain, palpitations, dyspnea, PND, orthopnea, nausea, vomiting, dizziness, syncope, edema, weight gain, or early satiety.  ***Notes: -Last ischemic evaluation: -Last echo: -Interim ED visits: Review of Systems  Please see the history of present illness.    All other systems reviewed and are otherwise negative except as noted above.  Physical Exam    Wt Readings from Last 3 Encounters:  04/05/23 231 lb 3.2 oz (104.9 kg)  03/25/23 230 lb 8 oz (104.6 kg)  02/22/23 231 lb 8 oz (105 kg)   WU:[id number] were no vitals filed for this visit.,There is no height or weight on file to calculate BMI. GEN: Well nourished, well developed in no acute distress Neck: No JVD; No carotid bruits Pulmonary: Clear to auscultation without rales, wheezing or rhonchi  Cardiovascular: Normal rate. Regular rhythm. Normal S1. Normal S2.   Murmurs: There is no murmur.  ABDOMEN: Soft, non-tender, non-distended EXTREMITIES:  No edema; No deformity   EKG/LABS/ Recent Cardiac Studies   ECG personally reviewed by me today - ***  Risk Assessment/Calculations:   {Does this patient have ATRIAL FIBRILLATION?[phone]}      Lab Results  Component Value Date   WBC 8.8 03/24/2023   HGB 14.3 03/24/2023   HCT 44.2 03/24/2023   MCV  88.9 03/24/2023   PLT 261 03/24/2023   Lab Results  Component Value Date   CREATININE 1.65 (H) 03/24/2023   BUN 22 03/24/2023   NA 139 03/24/2023   K 3.5 03/24/2023   CL 100 03/24/2023   CO2 24 03/24/2023   Lab Results  Component Value Date   CHOL 143 09/11/2020   HDL 56 09/11/2020   LDLCALC 69 09/11/2020   LDLDIRECT 171.5 12/06/2011   TRIG 98 09/11/2020   CHOLHDL 2.6 09/11/2020    Lab Results  Component Value Date   HGBA1C 6.1 (A) 04/05/2023   Assessment & Plan    1.***  2.***  3.***  4.***      Disposition: Follow-up with Dietrich Pates, MD or APP in *** months {Are you ordering a CV Procedure (e.g. stress test, cath, DCCV, TEE,  etc)?   Press F2        :[id number]}   Signed, Napoleon Form, Leodis Rains, NP 04/18/2023, 2:18 PM [facility]

## 2023-04-19 ENCOUNTER — Ambulatory Visit: Payer: Medicare HMO | Admitting: Physical Therapy

## 2023-04-19 ENCOUNTER — Encounter: Payer: Self-pay | Admitting: Physical Therapy

## 2023-04-19 DIAGNOSIS — G8929 Other chronic pain: Secondary | ICD-10-CM | POA: Diagnosis not present

## 2023-04-19 DIAGNOSIS — R262 Difficulty in walking, not elsewhere classified: Secondary | ICD-10-CM

## 2023-04-19 DIAGNOSIS — M5441 Lumbago with sciatica, right side: Secondary | ICD-10-CM | POA: Diagnosis not present

## 2023-04-19 DIAGNOSIS — M6281 Muscle weakness (generalized): Secondary | ICD-10-CM

## 2023-04-19 NOTE — Therapy (Signed)
OUTPATIENT PHYSICAL THERAPY THORACOLUMBAR TREATMENT   Patient Name: [name] MRN: [id number] DOB:Apr 24, 1946, 77 y.o., female Today's Date: 04/19/2023  END OF SESSION:  PT End of Session - 04/19/23 1058     Visit Number 5    Number of Visits 13    Date for PT Re-Evaluation 05/13/23    Authorization Type [employer] MEDICARE HMO/PPO    Authorization - Visit Number 4    Authorization - Number of Visits 27    PT Start Time 1100    PT Stop Time 1145    PT Time Calculation (min) 45 min                Past Medical History:  Diagnosis Date   Allergy    Anxiety    C. difficile diarrhea    history of   CAD (coronary artery disease), native coronary artery    a. 05/2017 showed 100% mLCx tx with DES, otherwise 20% mRCA, 50% ramus, 30% dLAD, 20% prox-mid LAD, 20% mLAD treated medically.    Cancer [facility])    Right kidney CA removed.    CKD (chronic kidney disease) stage 3, GFR 30-59 ml/min (HCC) 10/11/2016   s/p R nephrectomy   Colon polyps 2008   HYPERPLASTIC   COPD (chronic obstructive pulmonary disease) (HCC)    former smoker   Gastritis    GERD (gastroesophageal reflux disease)    History of echocardiogram    Echo 3/18:  Moderate LVH, EF 60-65, grade 1 diastolic dysfunction, calcified aortic valve, mild MR, moderate LAE   History of nuclear stress test    Myoview 3/18: Mod size and intensity fixed septal defect, may be artifact.Opposite mod size and intensity lat defect, which is reversible and could represent ischemia or possibly artifact (SDS 4). LVEF 71% with normal wall motion. Intermediate risk study. >> images reviewed with Dr. Dietrich Pates - no sig ischemia; med rx    Hx of cardiovascular stress test    Lexiscan Myoview 6/16:  EF 70%, no scar or ischemia; Low Risk   Hyperlipidemia    Hypertension    Neuropathy    Orthostatic hypotension 05/28/2017   OSA (obstructive sleep apnea) 01/16/2021   Osteoarthritis    Pre-diabetes    no meds   Rheumatoid arthritis (HCC)     RA (Dr. Dareen Piano) Bilateral hands   S/P angioplasty with stent 05/27/17 to LCX with DES  05/28/2017   Sleep apnea 2023   does not use CPAP   Thyroid disease    Past Surgical History:  Procedure Laterality Date   ABDOMINAL HYSTERECTOMY  1978   ANTERIOR CERVICAL DECOMP/DISCECTOMY FUSION N/A 05/29/2018   Procedure: ANTERIOR CERVICAL DECOMPRESSION FUSION - CERVICAL FIVE-CERVICAL SIX - CERVICAL SIX-CERVICAL SEVEN;  Surgeon: Julio Sicks, MD;  Location: [facility] OR;  Service: Neurosurgery;  Laterality: N/A;   BACK SURGERY     x 2   BREAST LUMPECTOMY WITH RADIOACTIVE SEED LOCALIZATION Right 04/13/2022   Procedure: RIGHT BREAST SEED LUMPECTOMY;  Surgeon: Harriette Bouillon, MD;  Location: [facility];  Service: General;  Laterality: Right;   CARPAL TUNNEL RELEASE Left    COLONOSCOPY W/ BIOPSIES AND POLYPECTOMY     Hx: of   CORONARY STENT INTERVENTION N/A 05/27/2017   Procedure: CORONARY STENT INTERVENTION;  Surgeon: Kathleene Hazel, MD;  Location: [facility];  Service: Cardiovascular;  Laterality: N/A;   ESOPHAGOGASTRODUODENOSCOPY     HNP     LEFT HEART CATH AND CORONARY ANGIOGRAPHY N/A 05/27/2017   Procedure:  LEFT HEART CATH AND CORONARY ANGIOGRAPHY;  Surgeon: Kathleene Hazel, MD;  Location: [facility];  Service: Cardiovascular;  Laterality: N/A;   LUMBAR LAMINECTOMY/DECOMPRESSION MICRODISCECTOMY Left 02/12/2013   Procedure: LUMBAR TWO THREE, LUMBAR THREE FOUR, LUMBAR FOUR FIVE  LAMINECTOMY/DECOMPRESSION MICRODISCECTOMY 3 LEVELS;  Surgeon: Temple Pacini, MD;  Location: [facility];  Service: Neurosurgery;  Laterality: Left;   NEPHRECTOMY Right 2010   10.rcc cancer   TOTAL KNEE ARTHROPLASTY Right    Redo   Patient Active Problem List   Diagnosis Date Noted   Local reaction to COVID-19 vaccine 10/12/2022   Moderate episode of recurrent major depressive disorder (HCC) 03/24/2022   PAD (peripheral artery disease) (HCC) 11/28/2021   OSA (obstructive sleep  apnea) 01/16/2021   COPD mixed type (HCC) 01/16/2021   Fatigue 11/03/2020   B12 deficiency 11/03/2020   Chest pain 08/30/2020   Malignant neoplasm of kidney excluding renal pelvis, unspecified laterality (HCC) 09/26/2019   Vitamin D deficiency, unspecified 09/26/2019   Anxiety disorder, unspecified 03/26/2019   Tobacco use disorder 09/29/2018   Cervical myelopathy (HCC) 05/29/2018   ANA positive 12/08/2017   Cervicalgia 09/16/2017   Carpal tunnel syndrome of left wrist 07/21/2017   Chondrocalcinosis 07/21/2017   Neuropathy 07/21/2017   Orthostatic hypotension 05/28/2017   S/P angioplasty with stent 05/27/17 to LCX with DES  05/28/2017   CAD (coronary artery disease)    Polyneuropathy 05/25/2017   Chest pain with moderate risk for cardiac etiology 05/25/2017   Class 1 obesity with serious comorbidity and body mass index (BMI) of 34.0 to 34.9 in adult 12/06/2016   CKD (chronic kidney disease) stage 3, GFR 30-59 ml/min (HCC) 10/11/2016   Hot flashes, menopausal 10/01/2016   Chest pain with moderate risk of acute coronary syndrome 10/25/2014   Degenerative spondylolisthesis 10/15/2014   Spinal stenosis, lumbar region, with neurogenic claudication 02/12/2013   DM (diabetes mellitus), type 2 with renal complications (HCC) 03/05/2010   History of Rtr nephrectomy 2010, secondary to renal cell cancer 01/14/2009   PERSONAL HX COLONIC POLYPS 11/14/2008   Hyperlipidemia associated with type 2 diabetes mellitus (HCC) 11/16/2007   ESOPHAGEAL STRICTURE 10/12/2007   Hypertension with heart disease 01/19/2007   Allergic rhinitis 01/19/2007   GERD 01/19/2007    PCP: [place], [name], MD  REFERRING PROVIDER: Floreen Comber, NP  REFERRING DIAG: M54.16 (ICD-10-CM) - Radiculopathy, lumbar region   Rationale for Evaluation and Treatment: Rehabilitation  THERAPY DIAG:  Chronic right-sided low back pain with right-sided sciatica  Difficulty in walking, not elsewhere classified  Muscle  weakness (generalized)  ONSET DATE: Chronic   SUBJECTIVE:  SUBJECTIVE STATEMENT: The weather has me aching today. Pain is 7/10 right low back/ hip. I do some of the exercises, not all of them.   Pt reports a chronic Hx of low back R>L and R hip pain which has been progressively getting worse since April of this year. She notes injections in the past have been helpful with decreasing her pain, however her last one did not help decrease the pain since.  PERTINENT HISTORY:  Anxiety, CAD, COPD, HTN, neuropathy, OA, Hx of multiple back surgeries, R TKR  PAIN:  Are you having pain? Yes: NPRS scale: 7/10 Pain location: low back and R hip, constant Pain description: ache Aggravating factors: Standing, walking, prolonged sitting  Relieving factors: lying down  PRECAUTIONS: None  RED FLAGS: None   WEIGHT BEARING RESTRICTIONS: No  FALLS:  Has patient fallen in last 6 months? No  LIVING ENVIRONMENT: Lives with: lives with their family Lives in: House/apartment Stairs: Yes: External: 2 steps; on left going up Has following equipment at home: Single point cane  OCCUPATION: Retired  PLOF: Independent  PATIENT GOALS: To feel better  NEXT MD VISIT: To call to schedule her next appt  OBJECTIVE:   DIAGNOSTIC FINDINGS:  X-rays demonstrate advanced osteoarthritis with significant joint space narrowing.  Large superior acetabular osteophyte                       06/09/20 DG Lumbar Spine FINDINGS: No evidence of fracture. Vertebral body heights are maintained. No substantial subluxation. L4-L5 fusion with intervening interbody spacer. No evidence of hardware complication. Moderate degenerative disc disease at L2-L3, L3-L4, and L5-S1. Milder degenerative disc disease at the other levels. Multilevel  facet arthropathy, greatest in the lower lumbar spine.   IMPRESSION: No radiographic evidence of acute fracture or traumatic malalignment. CT could provide more sensitive evaluation if clinically indicated.  PATIENT SURVEYS:  FOTO: Perceived function   30%, predicted   39%   SCREENING FOR RED FLAGS: Bowel or bladder incontinence: No Spinal tumors: No Cauda equina syndrome: No Compression fracture: No  COGNITION: Overall cognitive status: Within functional limits for tasks assessed     SENSATION: WFL  MUSCLE LENGTH: Hamstrings: Right WNLs; Left WNLs Thomas test: Right tight ; Left tight   POSTURE: rounded shoulders, forward head, anterior pelvic tilt, and flexed trunk   PALPATION: TTP R paraspinals, PSIS, and gluteal muscles  LUMBAR ROM:   AROM eval  Flexion Mod limited to mid shin; pulling pain   Extension Markedly limited; pressure pain R  Right lateral flexion Mod limited to above knee jt line; pressure pain R  Left lateral flexion Mod limited to above knee jt line; pulling pain R  Right rotation Min limited; pulling pain R  Left rotation Min limited; pulling pain R   (Blank rows = not tested)  LOWER EXTREMITY ROM:     Passive  Right eval Left eval  Hip flexion Limited pain Limited  Hip extension    Hip abduction    Hip adduction painful   Hip internal rotation Limited; pain Limited  Hip external rotation    Knee flexion    Knee extension    Ankle dorsiflexion    Ankle plantarflexion    Ankle inversion    Ankle eversion     (Blank rows = not tested)  LOWER EXTREMITY MMT:    Weak core MMT Right eval Left eval  Hip flexion 4 pain 4+  Hip extension 4 pain 4+  Hip abduction 4  Pain 4+  Hip adduction    Hip internal rotation    Hip external rotation 4 pain 4+  Knee flexion    Knee extension    Ankle dorsiflexion    Ankle plantarflexion    Ankle inversion    Ankle eversion     (Blank rows = not tested)  LUMBAR SPECIAL TESTS:  Straight leg  raise test: Negative, SI Compression/distraction test: Negative, and FABER test: Positive  HIP SPECIAL TESTS;   Scour test: Positive  Distraction test: Positive  FUNCTIONAL TESTS:  2 MWT: 301'  GAIT: Distance walked: 224ft Assistive device utilized: Single point cane Level of assistance: Modified independence Comments: Decreased pace  TODAY'S TREATMENT:   [facility] Adult PT Treatment:                                                DATE: 04/19/23 Therapeutic Exercise: Nustep L4 UE/LE x 5 minutes Bilat heel raises x 10 Hip abduction x 8 each Alt march standing x 8 each  Gastroc stretch bil x 15 sec each Seated EOM h/s stretch  Supine knee to chest x 2 , 20 sec each side LTR H/L clam with ab brace  Green banded bridge  H/s curls with feet on ball -cues for ab brace Ball bridge with ball under knees x 10 piriformis stretch  Manual Therapy: STM and MTPR to the R gluteal muscles using tennis ball    [facility] Adult PT Treatment:                                                DATE: 04/15/23 Therapeutic Exercise: Supine knee to chest x 2 , 20 sec each side  Lower trunk rotation x 10  Hamstring stretch x 2 each side, 30 sec  Hamstring curl x 10 with ball  Ball bridge with ball under knees x 10  Banded bridge x 10 green band  H/L hip clam x15 green band March green band x 10 with ab set  Updated HEP Manual Therapy: STM and MTPR to the R gluteal muscles Self Care: Education for use of SPC in proper hand. Height of PSC adjusted.  [facility] Adult PT Treatment:                                                DATE: 04/13/23 Therapeutic Exercise: NuStep L4 UE and LE for 5 min  Supine knee to chest x 2 , 20 sec each side  Lower trunk rotation x 10  Hamstring stretch x 2 each side, 30 sec  Hamstring curl x 10 with ball  Ball bridge with ball under knees x 10  Banded bridge x 10 green band  Knee to chest to reset  March green band x 10 with ab set  2 min walk 301 feet pain 9/10    PATIENT  EDUCATION:  Education details: Eval findings, POC  Person educated: Patient Education method: Explanation Education comprehension: verbalized understanding  HOME EXERCISE PROGRAM: Access Code: [email or web address] URL: [email or web address] Date: 04/15/2023 Prepared by: Joellyn Rued  Exercises - Supine Lower Trunk Rotation  - 1  x daily - 7 x weekly - 2 sets - 10 reps - 10 hold - Supine Single Knee to Chest  - 1 x daily - 7 x weekly - 1 sets - 3 reps - 30 hold - Supine Hamstring Stretch with Strap  - 1 x daily - 7 x weekly - 1 sets - 3 reps - 30 hold - Supine Piriformis Stretch with Foot on Ground  - 1 x daily - 7 x weekly - 1 sets - 3 reps - 30 hold - Beginner Bridge  - 1 x daily - 7 x weekly - 2 sets - 10 reps - 5 hold - Hooklying Clamshell with Resistance  - 1 x daily - 7 x weekly - 2 sets - 10 reps - 5 hold  ASSESSMENT:  CLINICAL IMPRESSION: Pt reports compliance with HEP. She is trying to wean from [facility]. She reports the STW to right hip was helpful. Today progressed to closed chain hip strength with good tolerance. Repeated TPR/ STW to right gluteal. Pt would like to try TPDN next visit.  Pt will continue to benefit from skilled PT to address impairments for improved function   OBJECTIVE IMPAIRMENTS: decreased activity tolerance, decreased balance, difficulty walking, decreased ROM, decreased strength, increased muscle spasms, postural dysfunction, obesity, and pain.   ACTIVITY LIMITATIONS: carrying, lifting, bending, sitting, standing, squatting, sleeping, stairs, and locomotion level  PARTICIPATION LIMITATIONS: meal prep, cleaning, laundry, and shopping  PERSONAL FACTORS: Age, Fitness, Past/current experiences, Time since onset of injury/illness/exacerbation, and 1-2 comorbidities:   neuropathy, OA, Hx of multiple back surgeries, R TKR are also affecting patient's functional outcome.   REHAB POTENTIAL: Good  CLINICAL DECISION MAKING: Evolving/moderate  complexity  EVALUATION COMPLEXITY: Moderate   GOALS:  SHORT TERM GOALS: Target date: 04/15/23  Pt will be Ind in an initial HEP  Baseline: TBD Goal status: MET  2.  Pt will voice understanding of measures to assist in pain reduction  Baseline:  04/15/23: Pt is using a SPC for wt bearing assist Goal status: ongoing    LONG TERM GOALS: Target date: 05/13/23  Pt will be Ind in a final HEP to maintain achieved LOF Baseline:  Goal status: INITIAL  2.  Increase hip strength to 4+/5 bilat for improved trunk support Baseline: see flow sheets Goal status: INITIAL  3.  Pt will be able to complete a bridge for 60 sec as demonstration of improved hip and trunk strenght Baseline: NT Goal status: INITIAL  4.  Pt's FOTO score will improved to the predicted value of 39% as indication of improved function  Baseline: 30% Goal status: INITIAL  5.  Improve by MCID of 41ft as indication of improved functional mobility  Baseline:301 feet  Goal status: INITIAL  PLAN:  PT FREQUENCY: 2x/week  PT DURATION: 6 weeks  PLANNED INTERVENTIONS: Therapeutic exercises, Therapeutic activity, Balance training, Gait training, Patient/Family education, Self Care, Joint mobilization, Stair training, Aquatic Therapy, Dry Needling, Spinal mobilization, Cryotherapy, Moist heat, Taping, Ultrasound, Ionotophoresis 4mg /ml Dexamethasone, Manual therapy, and Re-evaluation.  PLAN FOR NEXT SESSION: check HEP and progress ; Review FOTO; assess response to HEP; progress therex as indicated; use of modalities, manual therapy; and TPDN as indicated.    Jannette Spanner, PTA 04/19/23 11:58 AM Phone: [phone] Fax: [phone]

## 2023-04-20 ENCOUNTER — Encounter: Payer: Self-pay | Admitting: Nurse Practitioner

## 2023-04-20 ENCOUNTER — Ambulatory Visit: Payer: Medicare HMO | Attending: Nurse Practitioner | Admitting: Nurse Practitioner

## 2023-04-20 VITALS — BP 120/62 | HR 74 | Ht 66.0 in | Wt 231.2 lb

## 2023-04-20 DIAGNOSIS — I10 Essential (primary) hypertension: Secondary | ICD-10-CM

## 2023-04-20 DIAGNOSIS — I739 Peripheral vascular disease, unspecified: Secondary | ICD-10-CM | POA: Diagnosis not present

## 2023-04-20 DIAGNOSIS — I2510 Atherosclerotic heart disease of native coronary artery without angina pectoris: Secondary | ICD-10-CM | POA: Diagnosis not present

## 2023-04-20 DIAGNOSIS — E785 Hyperlipidemia, unspecified: Secondary | ICD-10-CM

## 2023-04-20 NOTE — Progress Notes (Signed)
HPI F Smoker (1 pk/ week, 15 pkyrs followed for OSA, Bronchiectasis, complicated by  Orthostasis, HTN, CAD/ stent, Allergic Rhinitis, GERD,  NPSG 10/07/18- AHI 65.8/ hr, desaturation to 89%, body weight 209 lbs HST 02/09/21- AHI 50.5/ hr, desaturation to a nadir of 70%- average 87%, body weight 226 lbs PFT 05/04/21- Mild airtrapping but normal flows w/o resp to BD. DLCO is high for volume. HST 12/07/21- AHI 46.7/ hr, desaturation to 64%(mean 88%) body weight 226 lbs ===============================================================    10/19/22-  76 yoF former smoker (1 pk/ week, 15 pkyrs) followed for OSA, COPD/Bronchiectasis, complicated by  Orthostasis, HTN, CAD/ stent, Aortic Atherosclerosis, Allergic Rhinitis, GERD, Depression, Memory Disturbance, Hx R Nephrectomy/ Renal Cell Ca,  Son is Lakenda Heit -Ventolin hfa, Singulair,Breztri, Flonase,  CPAP auto 4-10/ [facility] Intolerant of CPAP mask- referred for mask fitting by [facility] DME on 09/18/21 Download-compliance -77%, AHI 3.3/ hr Body weight today 230 lbs Covid vax-2 Phizer Flu vax-had Download reviewed.  She likes her nasal mask much better than the previous fullface mask.  We discussed comfort and compliance goals.  She is sleeping better. Markus Daft is definitely helping her breathing.  She is getting manufacturer's assistance with this and does not need changes. CXR   06/14/22- MPRESSION: Stable chest with no radiographic evidence of acute cardiopulmonary process.  04/21/23- 89 yoF former smoker (1 pk/ week, 10 pkyrs) followed for OSA, COPD/Bronchiectasis, complicated by  Orthostasis, HTN, CAD/ stent, Aortic Atherosclerosis, Allergic Rhinitis, GERD, Depression, Memory Disturbance, Hx R Nephrectomy/ Renal Cell Ca,  Son is Sharlet Salina Chaffin -Ventolin hfa, Singulair,Breztri, Flonase,  CPAP auto 4-10/ [facility] Download-compliance -87%, AHI 4.9/hr Body weight today 230 lbs Download reviewed.  She is doing better with CPAP. Hosp August w negative  w/u for chest pain, for cardiology f/u. Markus Daft works pretty well and she gets Therapist, art for it. Will get flu shot from her primary physician. Seeing a counselor for depression which is helpful. CXR 03/24/23- No evidence of acute cardiopulmonary disease.  ROS-see HPI   + = positive Constitutional:    weight loss, night sweats, fevers, chills, fatigue, lassitude. HEENT:    headaches, +difficulty swallowing, tooth/dental problems, sore throat,       sneezing, itching, ear ache, +nasal congestion, post nasal drip, snoring CV:    chest pain, orthopnea, PND, swelling in lower extremities, anasarca,      dizziness, palpitations Resp:   +shortness of breath with exertion or at rest.                productive cough,   non-productive cough, coughing up of blood.              change in color of mucus.  wheezing.   Skin:    rash or lesions. GI:  No-   heartburn, indigestion, abdominal pain, nausea, vomiting, diarrhea,                 change in bowel habits, loss of appetite GU: dysuria, change in color of urine, no urgency or frequency.   flank pain. MS:   +joint pain, stiffness, decreased range of motion, back pain. Neuro-     +dizzy Psych:  change in mood or affect.  depression or anxiety.   memory loss.  OBJ- Physical Exam General- Alert, Oriented, Affect-appropriate, Distress- none acute, + obese Skin- +spots of vitelligo Lymphadenopathy- none Head- atraumatic            Eyes- Gross vision intact, PERRLA, conjunctivae and secretions clear  Ears- Hearing, canals-normal            Nose- Clear, no-Septal dev, mucus, polyps, erosion, perforation             Throat- Mallampati III, mucosa clear , drainage- none, +tonsils, + teeth Neck- flexible , trachea midline, no stridor , thyroid nl, carotid no bruit Chest - symmetrical excursion , unlabored           Heart/CV- RRR , no murmur , no gallop  , no rub, nl s1 s2                           - JVD- none , edema- none,  stasis changes- none, varices- none           Lung- +clear, wheeze- none, cough- none , dullness-none, rub- none           Chest wall-  Abd-  Br/ Gen/ Rectal- Not done, not indicated Extrem- cyanosis- none, clubbing, none, atrophy- none, strength- nl Neuro- grossly intact to observation

## 2023-04-20 NOTE — Patient Instructions (Signed)
Medication Instructions:  Your physician recommends that you continue on your current medications as directed. Please refer to the Current Medication list given to you today. Please make sure you are taking Pantoprazole (protonix) *If you need a refill on your cardiac medications before your next appointment, please call your pharmacy*   Lab Work: None  If you have labs (blood work) drawn today and your tests are completely normal, you will receive your results only by: MyChart Message (if you have MyChart) OR A paper copy in the mail If you have any lab test that is abnormal or we need to change your treatment, we will call you to review the results.   Testing/Procedures: None   Follow-Up: At [facility], you and your health needs are our priority.  As part of our continuing mission to provide you with exceptional heart care, we have created designated Provider Care Teams.  These Care Teams include your primary Cardiologist (physician) and Advanced Practice Providers (APPs -  Physician Assistants and Nurse Practitioners) who all work together to provide you with the care you need, when you need it.  We recommend signing up for the patient portal called "MyChart".  Sign up information is provided on this After Visit Summary.  MyChart is used to connect with patients for Virtual Visits (Telemedicine).  Patients are able to view lab/test results, encounter notes, upcoming appointments, etc.  Non-urgent messages can be sent to your provider as well.   To learn more about what you can do with MyChart, go to [email or web address].    Your next appointment:   2 month(s)  Provider:   Dietrich Pates, MD     Other Instructions [facility] Diet A bland diet may consist of soft foods or foods that are not high in fat or are not greasy, acidic, or spicy. Avoiding certain foods may cause less irritation to your mouth, throat, stomach, or gastrointestinal tract. Avoiding certain foods may make  you feel better. Everyone's tolerances are different. A bland diet should be based on what you can tolerate and what may cause discomfort. What is my plan? Your health care provider or dietitian may recommend specific changes to your diet to treat your symptoms. These changes may include: Eating small meals frequently. Cooking food until it is soft enough to chew easily. Taking the time to chew your food thoroughly, so it is easy to swallow and digest. Avoiding foods that cause you discomfort. These may include spicy food, fried food, greasy foods, hard-to-chew foods, or citrus fruits and juices. Drinking slowly. What are tips for following this plan? Reading food labels To reduce fiber intake, look for food labels that say "whole," such as whole wheat or whole grain. Shopping Avoid food items that may have nuts or seeds. Avoid vegetables that may make you gassy or have a tough texture, such as broccoli, cauliflower, or corn. Cooking Cook foods thoroughly so they have a soft texture. Meal planning Make sure you include foods from all food groups to eat a balanced diet. Eat a variety of types of foods. Eat foods and drink beverages that do not cause you discomfort. These may include soups and broths with cooked meats, pasta, and vegetables. Lifestyle Sit up after meals, avoid tight clothing, and take time to eat and chew your food slowly. Ask your health care provider whether you should take dietary supplements. General information Mildly season your foods. Some seasonings, such as cayenne pepper, vinegar, or hot sauce, may cause irritation. The foods, beverages, or  seasonings to avoid should be based on individual tolerance. What foods should I eat? Fruits Canned or cooked fruit such as peaches, pears, or applesauce. Bananas. Vegetables Well-cooked vegetables. Canned or cooked vegetables such as carrots, green beans, beets, or spinach. Mashed or boiled potatoes. Grains  Hot cereals,  such as cream of wheat and processed oatmeal. Rice. Bread, crackers, pasta, or tortillas made from refined white flour. Meats and other proteins  Eggs. Creamy peanut butter or other nut butters. Lean, well-cooked tender meats, such as beef, pork, chicken, or fish. Dairy Low-fat dairy products such as milk, cottage cheese, or yogurt. Beverages  Water. Herbal tea. Apple juice. Fats and oils Mild salad dressings. Canola or olive oil. Sweets and desserts Low-fat pudding, custard, or ice cream. Fruit gelatin. The items listed above may not be a complete list of foods and beverages you can eat. Contact a dietitian for more information. What foods should I avoid? Fruits Citrus fruits, such as oranges and grapefruit. Fruits with a stringy texture. Fruits that have lots of seeds, such as kiwi or strawberries. Dried fruits. Vegetables Raw, uncooked vegetables. Salads. Grains Whole grain breads, muffins, and cereals. Meats and other proteins Tough, fibrous meats. Highly seasoned meat such as corned beef, smoked meats, or fish. Processed high-fat meats such as brats, hot dogs, or sausage. Dairy Full-fat dairy foods such as ice cream and cheese. Beverages Caffeinated drinks. Alcohol. Seasonings and condiments Strongly flavored seasonings or condiments. Hot sauce. Salsa. Other foods Spicy foods. Fried or greasy foods. Sour foods, such as pickled or fermented foods like sauerkraut. Foods high in fiber. The items listed above may not be a complete list of foods and beverages you should avoid. Contact a dietitian for more information. Summary A bland diet should be based on individual tolerance. It may consist of foods that are soft textured and do not have a lot of fat, fiber, acid, or seasonings. A bland diet may be recommended because avoiding certain foods, beverages, or spices may make you feel better. This information is not intended to replace advice given to you by your health care  provider. Make sure you discuss any questions you have with your health care provider. Document Revised: 06/08/2021 Document Reviewed: 06/08/2021 Elsevier Patient Education  2024 [employer] Inc[employer]  Low-Sodium Eating Plan Sodium, which is an element that makes up salt, helps you maintain a healthy balance of fluids in your body. Too much sodium can increase your blood pressure and cause fluid and waste to be held in your body. Your health care provider or dietitian may recommend following this plan if you have high blood pressure (hypertension), kidney disease, liver disease, or heart failure. Eating less sodium can help lower your blood pressure, reduce swelling, and protect your heart, liver, and kidneys. What are tips for following this plan? General guidelines Most people on this plan should limit their sodium intake to 1,500-2,000 mg (milligrams) of sodium each day. Reading food labels  The Nutrition Facts label lists the amount of sodium in one serving of the food. If you eat more than one serving, you must multiply the listed amount of sodium by the number of servings. Choose foods with less than 140 mg of sodium per serving. Avoid foods with 300 mg of sodium or more per serving. Shopping Look for lower-sodium products, often labeled as "low-sodium" or "no salt added." Always check the sodium content even if foods are labeled as "unsalted" or "no salt added". Buy fresh foods. Avoid canned foods and premade or  frozen meals. Avoid canned, cured, or processed meats Buy breads that have less than 80 mg of sodium per slice. Cooking Eat more home-cooked food and less restaurant, buffet, and fast food. Avoid adding salt when cooking. Use salt-free seasonings or herbs instead of table salt or sea salt. Check with your health care provider or pharmacist before using salt substitutes. Cook with plant-based oils, such as canola, sunflower, or olive oil. Meal planning When eating at a restaurant,  ask that your food be prepared with less salt or no salt, if possible. Avoid foods that contain MSG (monosodium glutamate). MSG is sometimes added to [place] food, bouillon, and some canned foods. What foods are recommended? The items listed may not be a complete list. Talk with your dietitian about what dietary choices are best for you. Grains Low-sodium cereals, including oats, puffed wheat and rice, and shredded wheat. Low-sodium crackers. Unsalted rice. Unsalted pasta. Low-sodium bread. Whole-grain breads and whole-grain pasta. Vegetables Fresh or frozen vegetables. "No salt added" canned vegetables. "No salt added" tomato sauce and paste. Low-sodium or reduced-sodium tomato and vegetable juice. Fruits Fresh, frozen, or canned fruit. Fruit juice. Meats and other protein foods Fresh or frozen (no salt added) meat, poultry, seafood, and fish. Low-sodium canned tuna and salmon. Unsalted nuts. Dried peas, beans, and lentils without added salt. Unsalted canned beans. Eggs. Unsalted nut butters. Dairy Milk. Soy milk. Cheese that is naturally low in sodium, such as ricotta cheese, fresh mozzarella, or Swiss cheese Low-sodium or reduced-sodium cheese. Cream cheese. Yogurt. Fats and oils Unsalted butter. Unsalted margarine with no trans fat. Vegetable oils such as canola or olive oils. Seasonings and other foods Fresh and dried herbs and spices. Salt-free seasonings. Low-sodium mustard and ketchup. Sodium-free salad dressing. Sodium-free light mayonnaise. Fresh or refrigerated horseradish. Lemon juice. Vinegar. Homemade, reduced-sodium, or low-sodium soups. Unsalted popcorn and pretzels. Low-salt or salt-free chips. What foods are not recommended? The items listed may not be a complete list. Talk with your dietitian about what dietary choices are best for you. Grains Instant hot cereals. Bread stuffing, pancake, and biscuit mixes. Croutons. Seasoned rice or pasta mixes. Noodle soup cups. Boxed or  frozen macaroni and cheese. Regular salted crackers. Self-rising flour. Vegetables Sauerkraut, pickled vegetables, and relishes. Olives. [place] fries. Onion rings. Regular canned vegetables (not low-sodium or reduced-sodium). Regular canned tomato sauce and paste (not low-sodium or reduced-sodium). Regular tomato and vegetable juice (not low-sodium or reduced-sodium). Frozen vegetables in sauces. Meats and other protein foods Meat or fish that is salted, canned, smoked, spiced, or pickled. Bacon, ham, sausage, hotdogs, corned beef, chipped beef, packaged lunch meats, salt pork, jerky, pickled herring, anchovies, regular canned tuna, sardines, salted nuts. Dairy Processed cheese and cheese spreads. Cheese curds. Blue cheese. Feta cheese. String cheese. Regular cottage cheese. Buttermilk. Canned milk. Fats and oils Salted butter. Regular margarine. Ghee. Bacon fat. Seasonings and other foods Onion salt, garlic salt, seasoned salt, table salt, and sea salt. Canned and packaged gravies. Worcestershire sauce. Tartar sauce. Barbecue sauce. Teriyaki sauce. Soy sauce, including reduced-sodium. Steak sauce. Fish sauce. Oyster sauce. Cocktail sauce. Horseradish that you find on the shelf. Regular ketchup and mustard. Meat flavorings and tenderizers. Bouillon cubes. Hot sauce and Tabasco sauce. Premade or packaged marinades. Premade or packaged taco seasonings. Relishes. Regular salad dressings. Salsa. Potato and tortilla chips. Corn chips and puffs. Salted popcorn and pretzels. Canned or dried soups. Pizza. Frozen entrees and pot pies. Summary Eating less sodium can help lower your blood pressure, reduce swelling, and protect your  heart, liver, and kidneys. Most people on this plan should limit their sodium intake to 1,500-2,000 mg (milligrams) of sodium each day. Canned, boxed, and frozen foods are high in sodium. Restaurant foods, fast foods, and pizza are also very high in sodium. You also get sodium by  adding salt to food. Try to cook at home, eat more fresh fruits and vegetables, and eat less fast food, canned, processed, or prepared foods. This information is not intended to replace advice given to you by your health care provider. Make sure you discuss any questions you have with your health care provider. Document Revised: 07/01/2017 Document Reviewed: 07/12/2016 Elsevier Patient Education  2020 Elsevier Inc.   Heart-Healthy Eating Plan Many factors influence your heart health, including eating and exercise habits. Heart health is also called coronary health. Coronary risk increases with abnormal blood fat (lipid) levels. A heart-healthy eating plan includes limiting unhealthy fats, increasing healthy fats, limiting salt (sodium) intake, and making other diet and lifestyle changes. What is my plan? Your health care provider may recommend that: You limit your fat intake to _________% or less of your total calories each day. You limit your saturated fat intake to _________% or less of your total calories each day. You limit the amount of cholesterol in your diet to less than _________ mg per day. You limit the amount of sodium in your diet to less than _________ mg per day. What are tips for following this plan? Cooking Cook foods using methods other than frying. Baking, boiling, grilling, and broiling are all good options. Other ways to reduce fat include: Removing the skin from poultry. Removing all visible fats from meats. Steaming vegetables in water or broth. Meal planning  At meals, imagine dividing your plate into fourths: Fill one-half of your plate with vegetables and green salads. Fill one-fourth of your plate with whole grains. Fill one-fourth of your plate with lean protein foods. Eat 2-4 cups of vegetables per day. One cup of vegetables equals 1 cup (91 g) broccoli or cauliflower florets, 2 medium carrots, 1 large bell pepper, 1 large sweet potato, 1 large tomato, 1  medium white potato, 2 cups (150 g) raw leafy greens. Eat 1-2 cups of fruit per day. One cup of fruit equals 1 small apple, 1 large banana, 1 cup (237 g) mixed fruit, 1 large orange,  cup (82 g) dried fruit, 1 cup (240 mL) 100% fruit juice. Eat more foods that contain soluble fiber. Examples include apples, broccoli, carrots, beans, peas, and barley. Aim to get 25-30 g of fiber per day. Increase your consumption of legumes, nuts, and seeds to 4-5 servings per week. One serving of dried beans or legumes equals  cup (90 g) cooked, 1 serving of nuts is  oz (12 almonds, 24 pistachios, or 7 walnut halves), and 1 serving of seeds equals  oz (8 g). Fats Choose healthy fats more often. Choose monounsaturated and polyunsaturated fats, such as olive and canola oils, avocado oil, flaxseeds, walnuts, almonds, and seeds. Eat more omega-3 fats. Choose salmon, mackerel, sardines, tuna, flaxseed oil, and ground flaxseeds. Aim to eat fish at least 2 times each week. Check food labels carefully to identify foods with trans fats or high amounts of saturated fat. Limit saturated fats. These are found in animal products, such as meats, butter, and cream. Plant sources of saturated fats include palm oil, palm kernel oil, and coconut oil. Avoid foods with partially hydrogenated oils in them. These contain trans fats. Examples are stick margarine,  some tub margarines, cookies, crackers, and other baked goods. Avoid fried foods. General information Eat more home-cooked food and less restaurant, buffet, and fast food. Limit or avoid alcohol. Limit foods that are high in added sugar and simple starches such as foods made using white refined flour (white breads, pastries, sweets). Lose weight if you are overweight. Losing just 5-10% of your body weight can help your overall health and prevent diseases such as diabetes and heart disease. Monitor your sodium intake, especially if you have high blood pressure. Talk with your  health care provider about your sodium intake. Try to incorporate more vegetarian meals weekly. What foods should I eat? Fruits All fresh, canned (in natural juice), or frozen fruits. Vegetables Fresh or frozen vegetables (raw, steamed, roasted, or grilled). Green salads. Grains Most grains. Choose whole wheat and whole grains most of the time. Rice and pasta, including brown rice and pastas made with whole wheat. Meats and other proteins Lean, well-trimmed beef, veal, pork, and lamb. Chicken and [place] without skin. All fish and shellfish. Wild duck, rabbit, pheasant, and venison. Egg whites or low-cholesterol egg substitutes. Dried beans, peas, lentils, and tofu. Seeds and most nuts. Dairy Low-fat or nonfat cheeses, including ricotta and mozzarella. Skim or 1% milk (liquid, powdered, or evaporated). Buttermilk made with low-fat milk. Nonfat or low-fat yogurt. Fats and oils Non-hydrogenated (trans-free) margarines. Vegetable oils, including soybean, sesame, sunflower, olive, avocado, peanut, safflower, corn, canola, and cottonseed. Salad dressings or mayonnaise made with a vegetable oil. Beverages Water (mineral or sparkling). Coffee and tea. Unsweetened ice tea. Diet beverages. Sweets and desserts Sherbet, gelatin, and fruit ice. Small amounts of dark chocolate. Limit all sweets and desserts. Seasonings and condiments All seasonings and condiments. The items listed above may not be a complete list of foods and beverages you can eat. Contact a dietitian for more options. What foods should I avoid? Fruits Canned fruit in heavy syrup. Fruit in cream or butter sauce. Fried fruit. Limit coconut. Vegetables Vegetables cooked in cheese, cream, or butter sauce. Fried vegetables. Grains Breads made with saturated or trans fats, oils, or whole milk. Croissants. Sweet rolls. Donuts. High-fat crackers, such as cheese crackers and chips. Meats and other proteins Fatty meats, such as hot dogs,  ribs, sausage, bacon, rib-eye roast or steak. High-fat deli meats, such as salami and bologna. Caviar. Domestic duck and goose. Organ meats, such as liver. Dairy Cream, sour cream, cream cheese, and creamed cottage cheese. Whole-milk cheeses. Whole or 2% milk (liquid, evaporated, or condensed). Whole buttermilk. Cream sauce or high-fat cheese sauce. Whole-milk yogurt. Fats and oils Meat fat, or shortening. Cocoa butter, hydrogenated oils, palm oil, coconut oil, palm kernel oil. Solid fats and shortenings, including bacon fat, salt pork, lard, and butter. Nondairy cream substitutes. Salad dressings with cheese or sour cream. Beverages Regular sodas and any drinks with added sugar. Sweets and desserts Frosting. Pudding. Cookies. Cakes. Pies. Milk chocolate or white chocolate. Buttered syrups. Full-fat ice cream or ice cream drinks. The items listed above may not be a complete list of foods and beverages to avoid. Contact a dietitian for more information. Summary Heart-healthy meal planning includes limiting unhealthy fats, increasing healthy fats, limiting salt (sodium) intake and making other diet and lifestyle changes. Lose weight if you are overweight. Losing just 5-10% of your body weight can help your overall health and prevent diseases such as diabetes and heart disease. Focus on eating a balance of foods, including fruits and vegetables, low-fat or nonfat dairy, lean protein, nuts  and legumes, whole grains, and heart-healthy oils and fats. This information is not intended to replace advice given to you by your health care provider. Make sure you discuss any questions you have with your health care provider. Document Revised: 08/24/2021 Document Reviewed: 08/24/2021 Elsevier Patient Education  2024 [employer].

## 2023-04-20 NOTE — Therapy (Signed)
OUTPATIENT PHYSICAL THERAPY THORACOLUMBAR TREATMENT   Patient Name: [name] MRN: [id number] DOB:May 11, 1946, 77 y.o., female Today's Date: 04/21/2023  END OF SESSION:  PT End of Session - 04/21/23 1334     Visit Number 6    Number of Visits 13    Date for PT Re-Evaluation 05/13/23    Authorization Type [employer] MEDICARE HMO/PPO    Authorization - Visit Number 5    Authorization - Number of Visits 27    PT Start Time 1333    PT Stop Time 1416    PT Time Calculation (min) 43 min    Activity Tolerance Patient tolerated treatment well    Behavior During Therapy WFL for tasks assessed/performed                 Past Medical History:  Diagnosis Date   Allergy    Anxiety    C. difficile diarrhea    history of   CAD (coronary artery disease), native coronary artery    a. 05/2017 showed 100% mLCx tx with DES, otherwise 20% mRCA, 50% ramus, 30% dLAD, 20% prox-mid LAD, 20% mLAD treated medically.    Cancer [facility])    Right kidney CA removed.    CKD (chronic kidney disease) stage 3, GFR 30-59 ml/min (HCC) 10/11/2016   s/p R nephrectomy   Colon polyps 2008   HYPERPLASTIC   COPD (chronic obstructive pulmonary disease) (HCC)    former smoker   Gastritis    GERD (gastroesophageal reflux disease)    History of echocardiogram    Echo 3/18:  Moderate LVH, EF 60-65, grade 1 diastolic dysfunction, calcified aortic valve, mild MR, moderate LAE   History of nuclear stress test    Myoview 3/18: Mod size and intensity fixed septal defect, may be artifact.Opposite mod size and intensity lat defect, which is reversible and could represent ischemia or possibly artifact (SDS 4). LVEF 71% with normal wall motion. Intermediate risk study. >> images reviewed with Dr. Dietrich Pates - no sig ischemia; med rx    Hx of cardiovascular stress test    Lexiscan Myoview 6/16:  EF 70%, no scar or ischemia; Low Risk   Hyperlipidemia    Hypertension    Neuropathy    Orthostatic hypotension 05/28/2017   OSA  (obstructive sleep apnea) 01/16/2021   Osteoarthritis    Pre-diabetes    no meds   Rheumatoid arthritis (HCC)    RA (Dr. Dareen Piano) Bilateral hands   S/P angioplasty with stent 05/27/17 to LCX with DES  05/28/2017   Sleep apnea 2023   does not use CPAP   Thyroid disease    Past Surgical History:  Procedure Laterality Date   ABDOMINAL HYSTERECTOMY  1978   ANTERIOR CERVICAL DECOMP/DISCECTOMY FUSION N/A 05/29/2018   Procedure: ANTERIOR CERVICAL DECOMPRESSION FUSION - CERVICAL FIVE-CERVICAL SIX - CERVICAL SIX-CERVICAL SEVEN;  Surgeon: Julio Sicks, MD;  Location: [facility] OR;  Service: Neurosurgery;  Laterality: N/A;   BACK SURGERY     x 2   BREAST LUMPECTOMY WITH RADIOACTIVE SEED LOCALIZATION Right 04/13/2022   Procedure: RIGHT BREAST SEED LUMPECTOMY;  Surgeon: Harriette Bouillon, MD;  Location: [facility];  Service: General;  Laterality: Right;   CARPAL TUNNEL RELEASE Left    COLONOSCOPY W/ BIOPSIES AND POLYPECTOMY     Hx: of   CORONARY STENT INTERVENTION N/A 05/27/2017   Procedure: CORONARY STENT INTERVENTION;  Surgeon: Kathleene Hazel, MD;  Location: [facility] INV[facility];  Service: Cardiovascular;  Laterality: N/A;   ESOPHAGOGASTRODUODENOSCOPY  HNP     LEFT HEART CATH AND CORONARY ANGIOGRAPHY N/A 05/27/2017   Procedure: LEFT HEART CATH AND CORONARY ANGIOGRAPHY;  Surgeon: Kathleene Hazel, MD;  Location: [facility];  Service: Cardiovascular;  Laterality: N/A;   LUMBAR LAMINECTOMY/DECOMPRESSION MICRODISCECTOMY Left 02/12/2013   Procedure: LUMBAR TWO THREE, LUMBAR THREE FOUR, LUMBAR FOUR FIVE  LAMINECTOMY/DECOMPRESSION MICRODISCECTOMY 3 LEVELS;  Surgeon: Temple Pacini, MD;  Location: [facility] NEURO [facility];  Service: Neurosurgery;  Laterality: Left;   NEPHRECTOMY Right 2010   10.rcc cancer   TOTAL KNEE ARTHROPLASTY Right    Redo   Patient Active Problem List   Diagnosis Date Noted   Local reaction to COVID-19 vaccine 10/12/2022   Moderate episode of recurrent major  depressive disorder (HCC) 03/24/2022   PAD (peripheral artery disease) (HCC) 11/28/2021   OSA (obstructive sleep apnea) 01/16/2021   COPD mixed type (HCC) 01/16/2021   Fatigue 11/03/2020   B12 deficiency 11/03/2020   Chest pain 08/30/2020   Malignant neoplasm of kidney excluding renal pelvis, unspecified laterality (HCC) 09/26/2019   Vitamin D deficiency, unspecified 09/26/2019   Anxiety disorder, unspecified 03/26/2019   Tobacco use disorder 09/29/2018   Cervical myelopathy (HCC) 05/29/2018   ANA positive 12/08/2017   Cervicalgia 09/16/2017   Carpal tunnel syndrome of left wrist 07/21/2017   Chondrocalcinosis 07/21/2017   Neuropathy 07/21/2017   Orthostatic hypotension 05/28/2017   S/P angioplasty with stent 05/27/17 to LCX with DES  05/28/2017   CAD (coronary artery disease)    Polyneuropathy 05/25/2017   Chest pain with moderate risk for cardiac etiology 05/25/2017   Class 1 obesity with serious comorbidity and body mass index (BMI) of 34.0 to 34.9 in adult 12/06/2016   CKD (chronic kidney disease) stage 3, GFR 30-59 ml/min (HCC) 10/11/2016   Hot flashes, menopausal 10/01/2016   Chest pain with moderate risk of acute coronary syndrome 10/25/2014   Degenerative spondylolisthesis 10/15/2014   Spinal stenosis, lumbar region, with neurogenic claudication 02/12/2013   DM (diabetes mellitus), type 2 with renal complications (HCC) 03/05/2010   History of Rtr nephrectomy 2010, secondary to renal cell cancer 01/14/2009   PERSONAL HX COLONIC POLYPS 11/14/2008   Hyperlipidemia associated with type 2 diabetes mellitus (HCC) 11/16/2007   ESOPHAGEAL STRICTURE 10/12/2007   Hypertension with heart disease 01/19/2007   Allergic rhinitis 01/19/2007   GERD 01/19/2007    PCP: [place], [name], MD  REFERRING PROVIDER: Floreen Comber, NP  REFERRING DIAG: M54.16 (ICD-10-CM) - Radiculopathy, lumbar region   Rationale for Evaluation and Treatment: Rehabilitation  THERAPY DIAG:  Chronic  right-sided low back pain with right-sided sciatica  Difficulty in walking, not elsewhere classified  Muscle weakness (generalized)  ONSET DATE: Chronic   SUBJECTIVE:  SUBJECTIVE STATEMENT: Pt reports her R low back/gluteal pain is about the same.  Pt reports a chronic Hx of low back R>L and R hip pain which has been progressively getting worse since April of this year. She notes injections in the past have been helpful with decreasing her pain, however her last one did not help decrease the pain since.  PERTINENT HISTORY:  Anxiety, CAD, COPD, HTN, neuropathy, OA, Hx of multiple back surgeries, R TKR  PAIN:  Are you having pain? Yes: NPRS scale: 7/10 Pain location: low back and R hip, constant Pain description: ache Aggravating factors: Standing, walking, prolonged sitting  Relieving factors: lying down  PRECAUTIONS: None  RED FLAGS: None   WEIGHT BEARING RESTRICTIONS: No  FALLS:  Has patient fallen in last 6 months? No  LIVING ENVIRONMENT: Lives with: lives with their family Lives in: House/apartment Stairs: Yes: External: 2 steps; on left going up Has following equipment at home: Single point cane  OCCUPATION: Retired  PLOF: Independent  PATIENT GOALS: To feel better  NEXT MD VISIT: To call to schedule her next appt  OBJECTIVE:   DIAGNOSTIC FINDINGS:  X-rays demonstrate advanced osteoarthritis with significant joint space narrowing.  Large superior acetabular osteophyte                       06/09/20 DG Lumbar Spine FINDINGS: No evidence of fracture. Vertebral body heights are maintained. No substantial subluxation. L4-L5 fusion with intervening interbody spacer. No evidence of hardware complication. Moderate degenerative disc disease at L2-L3, L3-L4, and L5-S1. Milder  degenerative disc disease at the other levels. Multilevel facet arthropathy, greatest in the lower lumbar spine.   IMPRESSION: No radiographic evidence of acute fracture or traumatic malalignment. CT could provide more sensitive evaluation if clinically indicated.  PATIENT SURVEYS:  FOTO: Perceived function   30%, predicted   39%   SCREENING FOR RED FLAGS: Bowel or bladder incontinence: No Spinal tumors: No Cauda equina syndrome: No Compression fracture: No  COGNITION: Overall cognitive status: Within functional limits for tasks assessed     SENSATION: WFL  MUSCLE LENGTH: Hamstrings: Right WNLs; Left WNLs Thomas test: Right tight ; Left tight   POSTURE: rounded shoulders, forward head, anterior pelvic tilt, and flexed trunk   PALPATION: TTP R paraspinals, PSIS, and gluteal muscles  LUMBAR ROM:   AROM eval  Flexion Mod limited to mid shin; pulling pain   Extension Markedly limited; pressure pain R  Right lateral flexion Mod limited to above knee jt line; pressure pain R  Left lateral flexion Mod limited to above knee jt line; pulling pain R  Right rotation Min limited; pulling pain R  Left rotation Min limited; pulling pain R   (Blank rows = not tested)  LOWER EXTREMITY ROM:     Passive  Right eval Left eval  Hip flexion Limited pain Limited  Hip extension    Hip abduction    Hip adduction painful   Hip internal rotation Limited; pain Limited  Hip external rotation    Knee flexion    Knee extension    Ankle dorsiflexion    Ankle plantarflexion    Ankle inversion    Ankle eversion     (Blank rows = not tested)  LOWER EXTREMITY MMT:    Weak core MMT Right eval Left eval  Hip flexion 4 pain 4+  Hip extension 4 pain 4+  Hip abduction 4 Pain 4+  Hip adduction    Hip internal rotation  Hip external rotation 4 pain 4+  Knee flexion    Knee extension    Ankle dorsiflexion    Ankle plantarflexion    Ankle inversion    Ankle eversion     (Blank  rows = not tested)  LUMBAR SPECIAL TESTS:  Straight leg raise test: Negative, SI Compression/distraction test: Negative, and FABER test: Positive  HIP SPECIAL TESTS;   Scour test: Positive  Distraction test: Positive  FUNCTIONAL TESTS:  2 MWT: 301'  GAIT: Distance walked: 291ft Assistive device utilized: Single point cane Level of assistance: Modified independence Comments: Decreased pace  TODAY'S TREATMENT:  [facility] Adult PT Treatment:                                                DATE: 04/21/23 Therapeutic Exercise: Supine knee to chest x 2 , 20 sec each side  Lower trunk rotation x 10  Piriformis stretch x 2 each side, 30 sec  Hamstring curl x 15 with ball  Ball bridge with ball under knees x 15 Banded bridge x 15 green band  H/L hip clam x15 green band March green band x 10 with ab set  Manual Therapy: STM to the R gluteal muscles Skilled palpation to identify TrPs and taut muscle bands Trigger Point Dry Needling Treatment: Pre-treatment instruction: Patient instructed on dry needling rationale, procedures, and possible side effects including pain during treatment (achy,cramping feeling), bruising, drop of blood, lightheadedness, nausea, sweating. Patient Consent Given: Yes Education handout provided: Yes Muscles treated: R piriformis,   Needle size and number: .30x148mm x 1 Electrical stimulation performed: No Parameters: N/A Treatment response/outcome: Twitch response elicited Post-treatment instructions: Patient instructed to expect possible mild to moderate muscle soreness later today and/or tomorrow. Patient instructed in methods to reduce muscle soreness and to continue prescribed HEP. If patient was dry needled over the lung field, patient was instructed on signs and symptoms of pneumothorax and, however unlikely, to see immediate medical attention should they occur. Patient was also educated on signs and symptoms of infection and to seek medical attention should they  occur. Patient verbalized understanding of these instructions and education.   [facility] Adult PT Treatment:                                                DATE: 04/19/23 Therapeutic Exercise: Nustep L4 UE/LE x 5 minutes Bilat heel raises x 10 Hip abduction x 8 each Alt march standing x 8 each  Gastroc stretch bil x 15 sec each Seated EOM h/s stretch  Supine knee to chest x 2 , 20 sec each side LTR H/L clam with ab brace  Green banded bridge  H/s curls with feet on ball -cues for ab brace Ball bridge with ball under knees x 10 piriformis stretch  Manual Therapy: STM and MTPR to the R gluteal muscles using tennis ball  [facility] Adult PT Treatment:                                                DATE: 04/15/23 Therapeutic Exercise: Supine knee to chest x 2 ,  20 sec each side  Lower trunk rotation x 10  Hamstring stretch x 2 each side, 30 sec  Hamstring curl x 10 with ball  Ball bridge with ball under knees x 10  Banded bridge x 10 green band  H/L hip clam x15 green band March green band x 10 with ab set  Updated HEP Manual Therapy: STM and MTPR to the R gluteal muscles Self Care: Education for use of SPC in proper hand. Height of SPC adjusted.  [facility] Adult PT Treatment:                                                DATE: 04/13/23 Therapeutic Exercise: NuStep L4 UE and LE for 5 min  Supine knee to chest x 2 , 20 sec each side  Lower trunk rotation x 10  Hamstring stretch x 2 each side, 30 sec  Hamstring curl x 10 with ball  Ball bridge with ball under knees x 10  Banded bridge x 10 green band  Knee to chest to reset  March green band x 10 with ab set  2 min walk 301 feet pain 9/10    PATIENT EDUCATION:  Education details: Eval findings, POC  Person educated: Patient Education method: Explanation Education comprehension: verbalized understanding  HOME EXERCISE PROGRAM: Access Code: [email or web address] URL: [email or web address] Date: 04/15/2023 Prepared by: Joellyn Rued  Exercises - Supine Lower Trunk Rotation  - 1 x daily - 7 x weekly - 2 sets - 10 reps - 10 hold - Supine Single Knee to Chest  - 1 x daily - 7 x weekly - 1 sets - 3 reps - 30 hold - Supine Hamstring Stretch with Strap  - 1 x daily - 7 x weekly - 1 sets - 3 reps - 30 hold - Supine Piriformis Stretch with Foot on Ground  - 1 x daily - 7 x weekly - 1 sets - 3 reps - 30 hold - Beginner Bridge  - 1 x daily - 7 x weekly - 2 sets - 10 reps - 5 hold - Hooklying Clamshell with Resistance  - 1 x daily - 7 x weekly - 2 sets - 10 reps - 5 hold  ASSESSMENT:  CLINICAL IMPRESSION: PT was completed for STM of the R gluteal muscles f/b TPDN to the R piriformis, and glute's max, med, and min. Muscle twitch responses were elicited c TPDN. Therex were then completed for flexibility and strengthening for muscle activation. Pt reported increased soreness as anticipated, but otherwise tolerated the PT session without adverse effects. Will assess pt's complete response to today's treatment the next PT visit.   Pt reports compliance with HEP. She is trying to wean from [facility]. She reports the STW to right hip was helpful. Today progressed to closed chain hip strength with good tolerance. Repeated TPR/ STW to right gluteal. Pt would like to try TPDN next visit.  Pt will continue to benefit from skilled PT to address impairments for improved function   OBJECTIVE IMPAIRMENTS: decreased activity tolerance, decreased balance, difficulty walking, decreased ROM, decreased strength, increased muscle spasms, postural dysfunction, obesity, and pain.   ACTIVITY LIMITATIONS: carrying, lifting, bending, sitting, standing, squatting, sleeping, stairs, and locomotion level  PARTICIPATION LIMITATIONS: meal prep, cleaning, laundry, and shopping  PERSONAL FACTORS: Age, Fitness, Past/current experiences, Time since onset of injury/illness/exacerbation, and 1-2 comorbidities:  neuropathy, OA, Hx of multiple back surgeries, R TKR are  also affecting patient's functional outcome.   REHAB POTENTIAL: Good  CLINICAL DECISION MAKING: Evolving/moderate complexity  EVALUATION COMPLEXITY: Moderate   GOALS:  SHORT TERM GOALS: Target date: 04/15/23  Pt will be Ind in an initial HEP  Baseline: TBD Goal status: MET  2.  Pt will voice understanding of measures to assist in pain reduction  Baseline:  04/15/23: Pt is using a SPC for wt bearing assist Goal status: ongoing . 04/21/23- MET- usiing tennis ball for massage   LONG TERM GOALS: Target date: 05/13/23  Pt will be Ind in a final HEP to maintain achieved LOF Baseline:  Goal status: INITIAL  2.  Increase hip strength to 4+/5 bilat for improved trunk support Baseline: see flow sheets Goal status: INITIAL  3.  Pt will be able to complete a bridge for 60 sec as demonstration of improved hip and trunk strenght Baseline: NT Goal status: INITIAL  4.  Pt's FOTO score will improved to the predicted value of 39% as indication of improved function  Baseline: 30% Goal status: INITIAL  5.  Improve by MCID of 78ft as indication of improved functional mobility  Baseline:301 feet  Goal status: INITIAL  PLAN:  PT FREQUENCY: 2x/week  PT DURATION: 6 weeks  PLANNED INTERVENTIONS: Therapeutic exercises, Therapeutic activity, Balance training, Gait training, Patient/Family education, Self Care, Joint mobilization, Stair training, Aquatic Therapy, Dry Needling, Spinal mobilization, Cryotherapy, Moist heat, Taping, Ultrasound, Ionotophoresis 4mg /ml Dexamethasone, Manual therapy, and Re-evaluation.  PLAN FOR NEXT SESSION: check HEP and progress ; Review FOTO; assess response to HEP; progress therex as indicated; use of modalities, manual therapy; and TPDN as indicated.   Ileigh Mettler MS, PT 04/21/23 8:30 PM

## 2023-04-21 ENCOUNTER — Encounter: Payer: Self-pay | Admitting: Internal Medicine

## 2023-04-21 ENCOUNTER — Ambulatory Visit: Payer: Medicare HMO | Admitting: Internal Medicine

## 2023-04-21 ENCOUNTER — Ambulatory Visit: Payer: Medicare HMO

## 2023-04-21 VITALS — BP 122/74 | HR 72 | Temp 97.3°F | Ht 66.0 in | Wt 230.0 lb

## 2023-04-21 DIAGNOSIS — G8929 Other chronic pain: Secondary | ICD-10-CM

## 2023-04-21 DIAGNOSIS — R262 Difficulty in walking, not elsewhere classified: Secondary | ICD-10-CM | POA: Diagnosis not present

## 2023-04-21 DIAGNOSIS — J449 Chronic obstructive pulmonary disease, unspecified: Secondary | ICD-10-CM

## 2023-04-21 DIAGNOSIS — M6281 Muscle weakness (generalized): Secondary | ICD-10-CM | POA: Diagnosis not present

## 2023-04-21 DIAGNOSIS — G4733 Obstructive sleep apnea (adult) (pediatric): Secondary | ICD-10-CM

## 2023-04-21 DIAGNOSIS — M5441 Lumbago with sciatica, right side: Secondary | ICD-10-CM | POA: Diagnosis not present

## 2023-04-21 NOTE — Patient Instructions (Signed)
We can continue CPAP auto 4-10  We can continue current meds for your breathing

## 2023-04-22 ENCOUNTER — Encounter: Payer: Self-pay | Admitting: Internal Medicine

## 2023-04-22 NOTE — Assessment & Plan Note (Signed)
Benefits from CPAP with satisfactory compliance and control Plan-continue auto 4-10

## 2023-04-22 NOTE — Assessment & Plan Note (Signed)
Breztri maintenance inhaler has been helpful

## 2023-04-25 NOTE — Therapy (Deleted)
OUTPATIENT PHYSICAL THERAPY THORACOLUMBAR TREATMENT   Patient Name: [name] MRN: [id number] DOB:1945-10-23, 77 y.o., female Today's Date: 04/25/2023  END OF SESSION:        Past Medical History:  Diagnosis Date   Allergy    Anxiety    C. difficile diarrhea    history of   CAD (coronary artery disease), native coronary artery    a. 05/2017 showed 100% mLCx tx with DES, otherwise 20% mRCA, 50% ramus, 30% dLAD, 20% prox-mid LAD, 20% mLAD treated medically.    Cancer [facility])    Right kidney CA removed.    CKD (chronic kidney disease) stage 3, GFR 30-59 ml/min (HCC) 10/11/2016   s/p R nephrectomy   Colon polyps 2008   HYPERPLASTIC   COPD (chronic obstructive pulmonary disease) (HCC)    former smoker   Gastritis    GERD (gastroesophageal reflux disease)    History of echocardiogram    Echo 3/18:  Moderate LVH, EF 60-65, grade 1 diastolic dysfunction, calcified aortic valve, mild MR, moderate LAE   History of nuclear stress test    Myoview 3/18: Mod size and intensity fixed septal defect, may be artifact.Opposite mod size and intensity lat defect, which is reversible and could represent ischemia or possibly artifact (SDS 4). LVEF 71% with normal wall motion. Intermediate risk study. >> images reviewed with Dr. Dietrich Pates - no sig ischemia; med rx    Hx of cardiovascular stress test    Lexiscan Myoview 6/16:  EF 70%, no scar or ischemia; Low Risk   Hyperlipidemia    Hypertension    Neuropathy    Orthostatic hypotension 05/28/2017   OSA (obstructive sleep apnea) 01/16/2021   Osteoarthritis    Pre-diabetes    no meds   Rheumatoid arthritis (HCC)    RA (Dr. Dareen Piano) Bilateral hands   S/P angioplasty with stent 05/27/17 to LCX with DES  05/28/2017   Sleep apnea 2023   does not use CPAP   Thyroid disease    Past Surgical History:  Procedure Laterality Date   ABDOMINAL HYSTERECTOMY  1978   ANTERIOR CERVICAL DECOMP/DISCECTOMY FUSION N/A 05/29/2018   Procedure: ANTERIOR  CERVICAL DECOMPRESSION FUSION - CERVICAL FIVE-CERVICAL SIX - CERVICAL SIX-CERVICAL SEVEN;  Surgeon: Julio Sicks, MD;  Location: [facility] OR;  Service: Neurosurgery;  Laterality: N/A;   BACK SURGERY     x 2   BREAST LUMPECTOMY WITH RADIOACTIVE SEED LOCALIZATION Right 04/13/2022   Procedure: RIGHT BREAST SEED LUMPECTOMY;  Surgeon: Harriette Bouillon, MD;  Location: [facility];  Service: General;  Laterality: Right;   CARPAL TUNNEL RELEASE Left    COLONOSCOPY W/ BIOPSIES AND POLYPECTOMY     Hx: of   CORONARY STENT INTERVENTION N/A 05/27/2017   Procedure: CORONARY STENT INTERVENTION;  Surgeon: Kathleene Hazel, MD;  Location: [facility] INVASIVE CV LAB;  Service: Cardiovascular;  Laterality: N/A;   ESOPHAGOGASTRODUODENOSCOPY     HNP     LEFT HEART CATH AND CORONARY ANGIOGRAPHY N/A 05/27/2017   Procedure: LEFT HEART CATH AND CORONARY ANGIOGRAPHY;  Surgeon: Kathleene Hazel, MD;  Location: [facility];  Service: Cardiovascular;  Laterality: N/A;   LUMBAR LAMINECTOMY/DECOMPRESSION MICRODISCECTOMY Left 02/12/2013   Procedure: LUMBAR TWO THREE, LUMBAR THREE FOUR, LUMBAR FOUR FIVE  LAMINECTOMY/DECOMPRESSION MICRODISCECTOMY 3 LEVELS;  Surgeon: Temple Pacini, MD;  Location: [facility] NEURO ORS;  Service: Neurosurgery;  Laterality: Left;   NEPHRECTOMY Right 2010   10.rcc cancer   TOTAL KNEE ARTHROPLASTY Right    Redo  Patient Active Problem List   Diagnosis Date Noted   Local reaction to COVID-19 vaccine 10/12/2022   Moderate episode of recurrent major depressive disorder (HCC) 03/24/2022   PAD (peripheral artery disease) (HCC) 11/28/2021   OSA (obstructive sleep apnea) 01/16/2021   COPD mixed type (HCC) 01/16/2021   Fatigue 11/03/2020   B12 deficiency 11/03/2020   Chest pain 08/30/2020   Malignant neoplasm of kidney excluding renal pelvis, unspecified laterality (HCC) 09/26/2019   Vitamin D deficiency, unspecified 09/26/2019   Anxiety disorder, unspecified 03/26/2019   Tobacco use  disorder 09/29/2018   Cervical myelopathy (HCC) 05/29/2018   ANA positive 12/08/2017   Cervicalgia 09/16/2017   Carpal tunnel syndrome of left wrist 07/21/2017   Chondrocalcinosis 07/21/2017   Neuropathy 07/21/2017   Orthostatic hypotension 05/28/2017   S/P angioplasty with stent 05/27/17 to LCX with DES  05/28/2017   CAD (coronary artery disease)    Polyneuropathy 05/25/2017   Chest pain with moderate risk for cardiac etiology 05/25/2017   Class 1 obesity with serious comorbidity and body mass index (BMI) of 34.0 to 34.9 in adult 12/06/2016   CKD (chronic kidney disease) stage 3, GFR 30-59 ml/min (HCC) 10/11/2016   Hot flashes, menopausal 10/01/2016   Chest pain with moderate risk of acute coronary syndrome 10/25/2014   Degenerative spondylolisthesis 10/15/2014   Spinal stenosis, lumbar region, with neurogenic claudication 02/12/2013   DM (diabetes mellitus), type 2 with renal complications (HCC) 03/05/2010   History of Rtr nephrectomy 2010, secondary to renal cell cancer 01/14/2009   PERSONAL HX COLONIC POLYPS 11/14/2008   Hyperlipidemia associated with type 2 diabetes mellitus (HCC) 11/16/2007   ESOPHAGEAL STRICTURE 10/12/2007   Hypertension with heart disease 01/19/2007   Allergic rhinitis 01/19/2007   GERD 01/19/2007    PCP: [place], [name], MD  REFERRING PROVIDER: Floreen Comber, NP  REFERRING DIAG: M54.16 (ICD-10-CM) - Radiculopathy, lumbar region   Rationale for Evaluation and Treatment: Rehabilitation  THERAPY DIAG:  No diagnosis found.  ONSET DATE: Chronic   SUBJECTIVE:                                                                                                                                                                                           SUBJECTIVE STATEMENT: Pt reports her R low back/gluteal pain is about the same.  Pt reports a chronic Hx of low back R>L and R hip pain which has been progressively getting worse since April of this year. She  notes injections in the past have been helpful with decreasing her pain, however her last one did not help decrease the pain since.  PERTINENT HISTORY:  Anxiety, CAD,  COPD, HTN, neuropathy, OA, Hx of multiple back surgeries, R TKR  PAIN:  Are you having pain? Yes: NPRS scale: 7/10 Pain location: low back and R hip, constant Pain description: ache Aggravating factors: Standing, walking, prolonged sitting  Relieving factors: lying down  PRECAUTIONS: None  RED FLAGS: None   WEIGHT BEARING RESTRICTIONS: No  FALLS:  Has patient fallen in last 6 months? No  LIVING ENVIRONMENT: Lives with: lives with their family Lives in: House/apartment Stairs: Yes: External: 2 steps; on left going up Has following equipment at home: Single point cane  OCCUPATION: Retired  PLOF: Independent  PATIENT GOALS: To feel better  NEXT MD VISIT: To call to schedule her next appt  OBJECTIVE:   DIAGNOSTIC FINDINGS:  X-rays demonstrate advanced osteoarthritis with significant joint space narrowing.  Large superior acetabular osteophyte                       06/09/20 DG Lumbar Spine FINDINGS: No evidence of fracture. Vertebral body heights are maintained. No substantial subluxation. L4-L5 fusion with intervening interbody spacer. No evidence of hardware complication. Moderate degenerative disc disease at L2-L3, L3-L4, and L5-S1. Milder degenerative disc disease at the other levels. Multilevel facet arthropathy, greatest in the lower lumbar spine.   IMPRESSION: No radiographic evidence of acute fracture or traumatic malalignment. CT could provide more sensitive evaluation if clinically indicated.  PATIENT SURVEYS:  FOTO: Perceived function   30%, predicted   39%   SCREENING FOR RED FLAGS: Bowel or bladder incontinence: No Spinal tumors: No Cauda equina syndrome: No Compression fracture: No  COGNITION: Overall cognitive status: Within functional limits for tasks  assessed     SENSATION: WFL  MUSCLE LENGTH: Hamstrings: Right WNLs; Left WNLs Thomas test: Right tight ; Left tight   POSTURE: rounded shoulders, forward head, anterior pelvic tilt, and flexed trunk   PALPATION: TTP R paraspinals, PSIS, and gluteal muscles  LUMBAR ROM:   AROM eval  Flexion Mod limited to mid shin; pulling pain   Extension Markedly limited; pressure pain R  Right lateral flexion Mod limited to above knee jt line; pressure pain R  Left lateral flexion Mod limited to above knee jt line; pulling pain R  Right rotation Min limited; pulling pain R  Left rotation Min limited; pulling pain R   (Blank rows = not tested)  LOWER EXTREMITY ROM:     Passive  Right eval Left eval  Hip flexion Limited pain Limited  Hip extension    Hip abduction    Hip adduction painful   Hip internal rotation Limited; pain Limited  Hip external rotation    Knee flexion    Knee extension    Ankle dorsiflexion    Ankle plantarflexion    Ankle inversion    Ankle eversion     (Blank rows = not tested)  LOWER EXTREMITY MMT:    Weak core MMT Right eval Left eval  Hip flexion 4 pain 4+  Hip extension 4 pain 4+  Hip abduction 4 Pain 4+  Hip adduction    Hip internal rotation    Hip external rotation 4 pain 4+  Knee flexion    Knee extension    Ankle dorsiflexion    Ankle plantarflexion    Ankle inversion    Ankle eversion     (Blank rows = not tested)  LUMBAR SPECIAL TESTS:  Straight leg raise test: Negative, SI Compression/distraction test: Negative, and FABER test: Positive  HIP SPECIAL TESTS;  Scour test: Positive  Distraction test: Positive  FUNCTIONAL TESTS:  2 MWT: 301'  GAIT: Distance walked: 21ft Assistive device utilized: Single point cane Level of assistance: Modified independence Comments: Decreased pace  TODAY'S TREATMENT:  [facility] Adult PT Treatment:                                                DATE: 04/21/23 Therapeutic Exercise: Supine knee  to chest x 2 , 20 sec each side  Lower trunk rotation x 10  Piriformis stretch x 2 each side, 30 sec  Hamstring curl x 15 with ball  Ball bridge with ball under knees x 15 Banded bridge x 15 green band  H/L hip clam x15 green band March green band x 10 with ab set  Manual Therapy: STM to the R gluteal muscles Skilled palpation to identify TrPs and taut muscle bands Trigger Point Dry Needling Treatment: Pre-treatment instruction: Patient instructed on dry needling rationale, procedures, and possible side effects including pain during treatment (achy,cramping feeling), bruising, drop of blood, lightheadedness, nausea, sweating. Patient Consent Given: Yes Education handout provided: Yes Muscles treated: R piriformis,   Needle size and number: .30x149mm x 1 Electrical stimulation performed: No Parameters: N/A Treatment response/outcome: Twitch response elicited Post-treatment instructions: Patient instructed to expect possible mild to moderate muscle soreness later today and/or tomorrow. Patient instructed in methods to reduce muscle soreness and to continue prescribed HEP. If patient was dry needled over the lung field, patient was instructed on signs and symptoms of pneumothorax and, however unlikely, to see immediate medical attention should they occur. Patient was also educated on signs and symptoms of infection and to seek medical attention should they occur. Patient verbalized understanding of these instructions and education.   [facility] Adult PT Treatment:                                                DATE: 04/19/23 Therapeutic Exercise: Nustep L4 UE/LE x 5 minutes Bilat heel raises x 10 Hip abduction x 8 each Alt march standing x 8 each  Gastroc stretch bil x 15 sec each Seated EOM h/s stretch  Supine knee to chest x 2 , 20 sec each side LTR H/L clam with ab brace  Green banded bridge  H/s curls with feet on ball -cues for ab brace Ball bridge with ball under knees x 10 piriformis  stretch  Manual Therapy: STM and MTPR to the R gluteal muscles using tennis ball  [facility] Adult PT Treatment:                                                DATE: 04/15/23 Therapeutic Exercise: Supine knee to chest x 2 , 20 sec each side  Lower trunk rotation x 10  Hamstring stretch x 2 each side, 30 sec  Hamstring curl x 10 with ball  Ball bridge with ball under knees x 10  Banded bridge x 10 green band  H/L hip clam x15 green band March green band x 10 with ab set  Updated HEP Manual Therapy: STM and MTPR to the  R gluteal muscles Self Care: Education for use of SPC in proper hand. Height of SPC adjusted.  [facility] Adult PT Treatment:                                                DATE: 04/13/23 Therapeutic Exercise: NuStep L4 UE and LE for 5 min  Supine knee to chest x 2 , 20 sec each side  Lower trunk rotation x 10  Hamstring stretch x 2 each side, 30 sec  Hamstring curl x 10 with ball  Ball bridge with ball under knees x 10  Banded bridge x 10 green band  Knee to chest to reset  March green band x 10 with ab set  2 min walk 301 feet pain 9/10    PATIENT EDUCATION:  Education details: Eval findings, POC  Person educated: Patient Education method: Explanation Education comprehension: verbalized understanding  HOME EXERCISE PROGRAM: Access Code: [email or web address] URL: [email or web address] Date: 04/15/2023 Prepared by: Joellyn Rued  Exercises - Supine Lower Trunk Rotation  - 1 x daily - 7 x weekly - 2 sets - 10 reps - 10 hold - Supine Single Knee to Chest  - 1 x daily - 7 x weekly - 1 sets - 3 reps - 30 hold - Supine Hamstring Stretch with Strap  - 1 x daily - 7 x weekly - 1 sets - 3 reps - 30 hold - Supine Piriformis Stretch with Foot on Ground  - 1 x daily - 7 x weekly - 1 sets - 3 reps - 30 hold - Beginner Bridge  - 1 x daily - 7 x weekly - 2 sets - 10 reps - 5 hold - Hooklying Clamshell with Resistance  - 1 x daily - 7 x weekly - 2 sets - 10 reps - 5  hold  ASSESSMENT:  CLINICAL IMPRESSION: PT was completed for STM of the R gluteal muscles f/b TPDN to the R piriformis, and glute's max, med, and min. Muscle twitch responses were elicited c TPDN. Therex were then completed for flexibility and strengthening for muscle activation. Pt reported increased soreness as anticipated, but otherwise tolerated the PT session without adverse effects. Will assess pt's complete response to today's treatment the next PT visit.   Pt reports compliance with HEP. She is trying to wean from [facility]. She reports the STW to right hip was helpful. Today progressed to closed chain hip strength with good tolerance. Repeated TPR/ STW to right gluteal. Pt would like to try TPDN next visit.  Pt will continue to benefit from skilled PT to address impairments for improved function   OBJECTIVE IMPAIRMENTS: decreased activity tolerance, decreased balance, difficulty walking, decreased ROM, decreased strength, increased muscle spasms, postural dysfunction, obesity, and pain.   ACTIVITY LIMITATIONS: carrying, lifting, bending, sitting, standing, squatting, sleeping, stairs, and locomotion level  PARTICIPATION LIMITATIONS: meal prep, cleaning, laundry, and shopping  PERSONAL FACTORS: Age, Fitness, Past/current experiences, Time since onset of injury/illness/exacerbation, and 1-2 comorbidities:   neuropathy, OA, Hx of multiple back surgeries, R TKR are also affecting patient's functional outcome.   REHAB POTENTIAL: Good  CLINICAL DECISION MAKING: Evolving/moderate complexity  EVALUATION COMPLEXITY: Moderate   GOALS:  SHORT TERM GOALS: Target date: 04/15/23  Pt will be Ind in an initial HEP  Baseline: TBD Goal status: MET  2.  Pt will voice understanding of measures to assist in  pain reduction  Baseline:  04/15/23: Pt is using a SPC for wt bearing assist Goal status: ongoing . 04/21/23- MET- usiing tennis ball for massage   LONG TERM GOALS: Target date: 05/13/23  Pt  will be Ind in a final HEP to maintain achieved LOF Baseline:  Goal status: INITIAL  2.  Increase hip strength to 4+/5 bilat for improved trunk support Baseline: see flow sheets Goal status: INITIAL  3.  Pt will be able to complete a bridge for 60 sec as demonstration of improved hip and trunk strenght Baseline: NT Goal status: INITIAL  4.  Pt's FOTO score will improved to the predicted value of 39% as indication of improved function  Baseline: 30% Goal status: INITIAL  5.  Improve by MCID of 22ft as indication of improved functional mobility  Baseline:301 feet  Goal status: INITIAL  PLAN:  PT FREQUENCY: 2x/week  PT DURATION: 6 weeks  PLANNED INTERVENTIONS: Therapeutic exercises, Therapeutic activity, Balance training, Gait training, Patient/Family education, Self Care, Joint mobilization, Stair training, Aquatic Therapy, Dry Needling, Spinal mobilization, Cryotherapy, Moist heat, Taping, Ultrasound, Ionotophoresis 4mg /ml Dexamethasone, Manual therapy, and Re-evaluation.  PLAN FOR NEXT SESSION: check HEP and progress ; Review FOTO; assess response to HEP; progress therex as indicated; use of modalities, manual therapy; and TPDN as indicated.   Allen Ralls MS, PT 04/25/23 9:58 AM

## 2023-04-26 ENCOUNTER — Ambulatory Visit: Payer: Medicare HMO | Admitting: Physical Therapy

## 2023-04-28 ENCOUNTER — Ambulatory Visit: Payer: Medicare HMO | Admitting: Physical Therapy

## 2023-04-28 ENCOUNTER — Ambulatory Visit: Payer: Medicare HMO | Admitting: Psychology

## 2023-04-28 DIAGNOSIS — F330 Major depressive disorder, recurrent, mild: Secondary | ICD-10-CM

## 2023-04-28 NOTE — Progress Notes (Signed)
[facility] Counselor/Therapist Progress Note  Patient ID: [name], MRN: [id number],    Date: 04/28/2023  Time Spent: 2:32pm-3:34pm  pt is seen for an in person visit.     Treatment Type: Individual Therapy  Reported Symptoms: pt reports positive interactions w/ family,  pt reports increased talking w/  friends and less avoidance.  Mental Status Exam: Appearance:  Well Groomed     Behavior: Appropriate  Motor: Normal  Speech/Language:  Normal Rate  Affect: Appropriate  Mood: Some down days  Thought process: normal  Thought content:   WNL  Sensory/Perceptual disturbances:   WNL  Orientation: oriented to person, place, time/date, and situation  Attention: Good  Concentration: Good  Memory: WNL  Fund of knowledge:  Good  Insight:   Good  Judgment:  Good  Impulse Control: Good   Risk Assessment: Danger to Self:  No Self-injurious Behavior: No Danger to Others: No Duty to Warn:no Physical Aggression / Violence:No  Access to Firearms a concern: No  Gang Involvement:No   Subjective: Counselor assessed pt current functioning per pt report. Processed w/pt recent interactions and mood.  Explored new stressor of dog in home.  Discussed positives of connecting w/ friends more.  Encouraged pt to visit cousin as invited.   Pt affect wnl.  Pt reports she is doing ok.  Pt reports some days feels like staying in and just watching tv.  Pt reports increased interaction w/ friends through phone calls and is enjoying.  Pt reports that frustration of puppy in the home that daughter got.  Pt reported that her cousin has invited to stay for couple weeks w/ her in [place].  Pt is considering and acknowledged would be positive.    Interventions: Cognitive Behavioral Therapy and behavioral activation  Diagnosis:Mild episode of recurrent major depressive disorder (HCC)  Plan: pt to f/u in 3 weeks for counseling.  Pt to f/u as scheduled w/ PCP.  Pt reports has a referral to  psychiatrist for medication eval oct 2024.     Individualized Treatment Plan Strengths: enjoys time w/ daughter, son and w/ extended family and church community  Supports: her daughter   Goal/Needs for Treatment:  In order of importance to patient 1) engaged in activities/ talk w/ friends 2) decreased depressed moods 3) ---   Client Statement of Needs: "Get out of this mood I'm in. "   Treatment Level:out pt counseling  Symptoms:depressed mood, tearfulness, loss of interest, difficulty w/ motivation, withdrawn from interacting.   Client Treatment Preferences:biweekly in person counseling.  Continue medication management w/ PCP.    Healthcare consumer's goal for treatment:  Counselor, Forde Radon, [facility] will support the patient's ability to achieve the goals identified. Cognitive Behavioral Therapy, Assertive Communication/Conflict Resolution Training, Relaxation Training, ACT, Humanistic and other evidenced-based practices will be used to promote progress towards healthy functioning.   Healthcare consumer will: Actively participate in therapy, working towards healthy functioning.    *Justification for Continuation/Discontinuation of Goal: R=Revised, O=Ongoing, A=Achieved, D=Discontinued  Goal 1)  Increased engagement w/ activities she enjoys and w/ social interactions AEB pt report and counselor observation. Baseline date 12/02/22: Progress towards goal 0; How Often - Daily Target Date Goal Was reviewed Status Code Progress towards goal/Likert rating  12/02/23                Goal 2) Increased coping w/ losses and w/ depressed moods AEB utilizing coping skills daily.   Baseline date 12/02/22: Progress towards goal 0; How Often - Daily  Target Date Goal Was reviewed Status Code Progress towards goal  12/02/23                  This plan has been reviewed and created by the following participants:  This plan will be reviewed at least every 12 months. Date Behavioral Health Clinician  Date Guardian/Patient   12/02/22  [facility] Ophelia Charter [facility] 12/02/22 Verbal Consent Provided                           Forde Radon [facility]

## 2023-05-02 ENCOUNTER — Telehealth: Payer: Self-pay | Admitting: Gastroenterology

## 2023-05-02 NOTE — Telephone Encounter (Signed)
Inbound call from patient stating she feels as though her food is getting stuck. Patient is requesting to have her esophagus stretched again. Patient states she is not able to wait until January to be seen. Patient requesting a call back to discuss further. Please advise, thank you.

## 2023-05-03 NOTE — Telephone Encounter (Signed)
I have spoken to patient who states that she has been having intermittent dysphagia to pills recently as well as some difficulty getting liquids down. States she feels like everything sits in the throat. Denies any nauasea or vomiting, no abdominal pain. Patient tells me that she is compliant with antireflux diet "as much as I can". She is taking pantoprazole 40 mg once daily (she tells me that she does take this 30 minutes before breakfast) and famotidine twice daily. Patient is instructed to chew foods well, take breaks between sips of fluid etc. She is advised to continue PPI and H2 inhibitor and to continue following antireflux diet and regimen. She is scheduled for appointment with Boone Master, PA-C on 05/09/23 and is instructed that should she become unable to swallow her secretions or have severe worsening symptoms, she should report to emergency room for more expedited care. She verbalizes understanding.

## 2023-05-05 ENCOUNTER — Ambulatory Visit: Payer: Medicare HMO | Admitting: Dermatology

## 2023-05-09 ENCOUNTER — Ambulatory Visit: Payer: Medicare HMO | Admitting: Gastroenterology

## 2023-05-09 ENCOUNTER — Encounter: Payer: Self-pay | Admitting: Gastroenterology

## 2023-05-09 VITALS — BP 124/82 | HR 63 | Ht 66.0 in | Wt 229.0 lb

## 2023-05-09 DIAGNOSIS — K219 Gastro-esophageal reflux disease without esophagitis: Secondary | ICD-10-CM | POA: Diagnosis not present

## 2023-05-09 DIAGNOSIS — R1310 Dysphagia, unspecified: Secondary | ICD-10-CM

## 2023-05-09 DIAGNOSIS — K5900 Constipation, unspecified: Secondary | ICD-10-CM

## 2023-05-09 DIAGNOSIS — Z860100 Personal history of colon polyps, unspecified: Secondary | ICD-10-CM

## 2023-05-09 MED ORDER — NA SULFATE-K SULFATE-MG SULF 17.5-3.13-1.6 GM/177ML PO SOLN: 1.0000 | Freq: Once | ORAL | 0 refills | Status: AC

## 2023-05-09 NOTE — Patient Instructions (Signed)
You have been scheduled for an endoscopy and colonoscopy. Please follow the written instructions given to you at your visit today.  Please pick up your prep supplies at the pharmacy within the next 1-3 days.  If you use inhalers (even only as needed), please bring them with you on the day of your procedure.  DO NOT TAKE 7 DAYS PRIOR TO TEST- Trulicity (dulaglutide) Ozempic, Wegovy (semaglutide) Mounjaro (tirzepatide) Bydureon Bcise (exanatide extended release)  DO NOT TAKE 1 DAY PRIOR TO YOUR TEST Rybelsus (semaglutide) Adlyxin (lixisenatide) Victoza (liraglutide) Byetta (exanatide) ___________________________________________________________________________   _______________________________________________________  If your blood pressure at your visit was 140/90 or greater, please contact your primary care physician to follow up on this.  _______________________________________________________  If you are age 12 or older, your body mass index should be between 23-30. Your Body mass index is 36.96 kg/m. If this is out of the aforementioned range listed, please consider follow up with your Primary Care Provider.  If you are age 48 or younger, your body mass index should be between 19-25. Your Body mass index is 36.96 kg/m. If this is out of the aformentioned range listed, please consider follow up with your Primary Care Provider.   ________________________________________________________  The [place] [facility] providers would like to encourage you to use [facility] to communicate with providers for non-urgent requests or questions.  Due to long hold times on the telephone, sending your provider a message by [facility] may be a faster and more efficient way to get a response.  Please allow 48 business hours for a response.  Please remember that this is for non-urgent requests.  _______________________________________________________

## 2023-05-09 NOTE — Progress Notes (Signed)
Chief Complaint: Dysphagia/GERD Primary GI MD: Dr. Barron Alvine  HPI: 77 year old female with past medical history of anxiety, CAD s/p stent in 2018 (January 2022 echo with LVEF normal at 60 to 65% and no aortic stenosis), history of C. difficile colitis, adenomatous polyps, and others listed below presents for evaluation of dysphagia/GERD.  Last seen January 2024 by Dr. Barron Alvine.  At that time she was having some diarrhea with postprandial urgency with 3-4 BMs per day.  She previously had a longstanding history of IBS-C treated with MiraLAX but began having that diarrhea after contracting COVID September 2023.  Patient was put on rifaximin 550 Mg 3 times daily for 14 days and recommended to start a probiotic and fiber supplement.  Patient states her diarrhea has improved and she is back to her usual state of constipation which is currently well-controlled on MiraLAX as needed.  Patient states over the last 2 weeks she has been experiencing dysphagia to solids and pills.  States she also has an increase in her heartburn.  She is on Protonix 40 Mg once daily that is no longer providing relief.  States when she swallowed solids will get stuck in her suprasternal notch and she has to push it down with liquids.  Sometimes when she takes her medications at night she feels her pills get stuck and then when she swallows it would liquids she is able to get it down but sometimes she will burp and the liquid will come back up.  She reports this happening in the past in which she had an EGD with dilation with improvement.  She does note she has been under more stress lately and she has been talking to a therapist which has provided some improvement.  Recently seen in emergency department for chest pain with negative cardiac workup and she was told it was likely due to stress.   PREVIOUS GI WORKUP   04/18/2015 colonoscopy Dr. Arlyce Dice with sessile polyp in the ascending colon.  Pathology showed tubular  adenoma and repeat was recommended in 5 years.   02/05/2020 EGD for GERD and dysphagia with gastritis and otherwise normal.   Biopsies showed mild chronic gastritis.  02/05/2020 colonoscopy with 5 polyps and diverticulosis in the left colon, biopsy showed tubular adenomas and repeat recommended in 3 years. 01/2023  Past Medical History:  Diagnosis Date   Allergy    Anxiety    C. difficile diarrhea    history of   CAD (coronary artery disease), native coronary artery    a. 05/2017 showed 100% mLCx tx with DES, otherwise 20% mRCA, 50% ramus, 30% dLAD, 20% prox-mid LAD, 20% mLAD treated medically.    Cancer [facility])    Right kidney CA removed.    CKD (chronic kidney disease) stage 3, GFR 30-59 ml/min (HCC) 10/11/2016   s/p R nephrectomy   Colon polyps 2008   HYPERPLASTIC   COPD (chronic obstructive pulmonary disease) (HCC)    former smoker   Gastritis    GERD (gastroesophageal reflux disease)    History of echocardiogram    Echo 3/18:  Moderate LVH, EF 60-65, grade 1 diastolic dysfunction, calcified aortic valve, mild MR, moderate LAE   History of nuclear stress test    Myoview 3/18: Mod size and intensity fixed septal defect, may be artifact.Opposite mod size and intensity lat defect, which is reversible and could represent ischemia or possibly artifact (SDS 4). LVEF 71% with normal wall motion. Intermediate risk study. >> images reviewed with Dr. Dietrich Pates - no  sig ischemia; med rx    Hx of cardiovascular stress test    Lexiscan Myoview 6/16:  EF 70%, no scar or ischemia; Low Risk   Hyperlipidemia    Hypertension    Neuropathy    Orthostatic hypotension 05/28/2017   OSA (obstructive sleep apnea) 01/16/2021   Osteoarthritis    Pre-diabetes    no meds   Rheumatoid arthritis (HCC)    RA (Dr. Dareen Piano) Bilateral hands   S/P angioplasty with stent 05/27/17 to LCX with DES  05/28/2017   Sleep apnea 2023   does not use CPAP   Thyroid disease     Past Surgical History:  Procedure  Laterality Date   ABDOMINAL HYSTERECTOMY  1978   ANTERIOR CERVICAL DECOMP/DISCECTOMY FUSION N/A 05/29/2018   Procedure: ANTERIOR CERVICAL DECOMPRESSION FUSION - CERVICAL FIVE-CERVICAL SIX - CERVICAL SIX-CERVICAL SEVEN;  Surgeon: Julio Sicks, MD;  Location: [facility] OR;  Service: Neurosurgery;  Laterality: N/A;   BACK SURGERY     x 2   BREAST LUMPECTOMY WITH RADIOACTIVE SEED LOCALIZATION Right 04/13/2022   Procedure: RIGHT BREAST SEED LUMPECTOMY;  Surgeon: Harriette Bouillon, MD;  Location: [facility];  Service: General;  Laterality: Right;   CARPAL TUNNEL RELEASE Left    COLONOSCOPY W/ BIOPSIES AND POLYPECTOMY     Hx: of   CORONARY STENT INTERVENTION N/A 05/27/2017   Procedure: CORONARY STENT INTERVENTION;  Surgeon: Kathleene Hazel, MD;  Location: [facility] INVASIVE [facility];  Service: Cardiovascular;  Laterality: N/A;   ESOPHAGOGASTRODUODENOSCOPY     HNP     LEFT HEART CATH AND CORONARY ANGIOGRAPHY N/A 05/27/2017   Procedure: LEFT HEART CATH AND CORONARY ANGIOGRAPHY;  Surgeon: Kathleene Hazel, MD;  Location: [facility];  Service: Cardiovascular;  Laterality: N/A;   LUMBAR LAMINECTOMY/DECOMPRESSION MICRODISCECTOMY Left 02/12/2013   Procedure: LUMBAR TWO THREE, LUMBAR THREE FOUR, LUMBAR FOUR FIVE  LAMINECTOMY/DECOMPRESSION MICRODISCECTOMY 3 LEVELS;  Surgeon: Temple Pacini, MD;  Location: [facility]URO [facility];  Service: Neurosurgery;  Laterality: Left;   NEPHRECTOMY Right 2010   10.rcc cancer   TOTAL KNEE ARTHROPLASTY Right    Redo    Current Outpatient Medications  Medication Sig Dispense Refill   acetaminophen (TYLENOL) 500 MG tablet Take 1,000 mg by mouth as needed for moderate pain or headache.      albuterol (VENTOLIN HFA) 108 (90 Base) MCG/ACT inhaler INHALE ONE OR TWO PUFFS BY MOUTH INTO THE LUNGS EVERY SIX HOURS AS NEEDED FOR WHEEZING OR SHORTNESS OF BREATH 8.5 g 12   aspirin 81 MG chewable tablet Chew 81 mg by mouth daily.     Budeson-Glycopyrrol-Formoterol (BREZTRI  AEROSPHERE) 160-9-4.8 MCG/ACT AERO Inhale 2 puffs into the lungs in the morning and at bedtime. 2 each 11   diazepam (VALIUM) 5 MG tablet Take 1 tablet by oral route 45 minutes prior to scheduled MRI. Repeat after 20 minutes if needed. Arrange transportation to and from MRI.     Evolocumab (REPATHA SURECLICK) 140 MG/ML SOAJ Inject 140 mg into the skin every 14 (fourteen) days. 2 mL 11   famotidine (PEPCID) 20 MG tablet TAKE ONE TABLET BY MOUTH EVERY MORNING and TAKE ONE TABLET BY MOUTH EVERYDAY AT BEDTIME 180 tablet 1   fluticasone (FLONASE) 50 MCG/ACT nasal spray PLACE 2 SPRAYS INTO BOTH NOSTRILS AT BEDTIME AS NEEDED FOR ALLERGIES. 48 mL 1   gabapentin (NEURONTIN) 300 MG capsule TAKE 1 CAPSULE BY MOUTH EVERY DAY AT BEDTIME 30 capsule 2   hydrochlorothiazide (HYDRODIURIL) 25 MG tablet TAKE ONE TABLET  BY MOUTH ONCE DAILY 90 tablet 2   losartan (COZAAR) 100 MG tablet TAKE ONE TABLET BY MOUTH EVERY MORNING 90 tablet 2   meclizine (ANTIVERT) 25 MG tablet Take 1 tablet (25 mg total) by mouth 3 (three) times daily as needed for dizziness. 30 tablet 0   metoprolol succinate (TOPROL-XL) 100 MG 24 hr tablet TAKE ONE TABLET BY MOUTH AT NOON 90 tablet 2   montelukast (SINGULAIR) 10 MG tablet TAKE ONE TABLET BY MOUTH EVERYDAY AT BEDTIME 30 tablet 3   MYRBETRIQ 50 MG TB24 tablet Take 50 mg by mouth daily.     nitrofurantoin, macrocrystal-monohydrate, (MACROBID) 100 MG capsule Take 100 mg by mouth 2 (two) times daily.     nitroGLYCERIN (NITROSTAT) 0.4 MG SL tablet Place 1 tablet (0.4 mg total) under the tongue every 5 (five) minutes as needed for chest pain. 25 tablet 2   ondansetron (ZOFRAN-ODT) 4 MG disintegrating tablet 4mg  ODT q4 hours prn nausea/vomit 20 tablet 0   pantoprazole (PROTONIX) 40 MG tablet TAKE ONE TABLET BY MOUTH ONCE DAILY 90 tablet 3   rosuvastatin (CRESTOR) 10 MG tablet Take 1 tablet (10 mg total) by mouth daily. 90 tablet 3   sertraline (ZOLOFT) 50 MG tablet TAKE ONE TABLET BY MOUTH AT NOON  90 tablet 2   triamcinolone cream (KENALOG) 0.1 % Apply 1 Application topically 3 times/day as needed-between meals & bedtime. 30 g 1   No current facility-administered medications for this visit.    Allergies as of 05/09/2023 - Review Complete 04/22/2023  Allergen Reaction Noted   Oxycodone-acetaminophen Hives and Itching 11/04/2014   Percocet [oxycodone-acetaminophen] Hives, Itching, and Other (See Comments) 11/04/2014   Pravastatin sodium Other (See Comments) 05/03/2017   Hydrocodone Other (See Comments) 12/04/2014   Aspirin Other (See Comments)     Family History  Problem Relation Age of Onset   Rheum arthritis Mother    Stroke Mother    Prostate cancer Father    Heart disease Father    Diabetes Brother    Dementia Brother    Parkinsonism Brother    Diabetes Brother    Cancer Brother    Dementia Brother    Kidney disease Son    Bipolar disorder Daughter    Colon cancer Neg Hx    Esophageal cancer Neg Hx    Pancreatic cancer Neg Hx    Liver disease Neg Hx    Rectal cancer Neg Hx    Stomach cancer Neg Hx     Social History   Socioeconomic History   Marital status: Divorced    Spouse name: Not on file   Number of children: 3   Years of education: 12   Highest education level: Not on file  Occupational History   Occupation: Retired    Associate Professor: RETIRED  Tobacco Use   Smoking status: Former    Current packs/day: 0.00    Average packs/day: 0.2 packs/day for 53.1 years (10.6 ttl pk-yrs)    Types: Cigarettes    Start date: 26    Quit date: 09/07/2021    Years since quitting: 1.6   Smokeless tobacco: Never   Tobacco comments:    pt has been an on and off smoker since age 30  Vaping Use   Vaping status: Never Used  Substance and Sexual Activity   Alcohol use: No    Alcohol/week: 0.0 standard drinks of alcohol   Drug use: No   Sexual activity: Not Currently    Birth control/protection: Surgical  Comment: hyst  Other Topics Concern   Not on file  Social  History Narrative   Single, dgtr lives with her   Current on/off smoker   Alcohol use- no   Drug use-no   Regular Exercise-yes   Social Determinants of Health   Financial Resource Strain: Low Risk  (09/08/2022)   Overall Financial Resource Strain (CARDIA)    Difficulty of Paying Living Expenses: Not hard at all  Food Insecurity: No Food Insecurity (10/20/2022)   Hunger Vital Sign    Worried About Running Out of Food in the Last Year: Never true    Ran Out of Food in the Last Year: Never true  Transportation Needs: No Transportation Needs (09/08/2022)   PRAPARE - Administrator, Civil Service (Medical): No    Lack of Transportation (Non-Medical): No  Physical Activity: Inactive (09/08/2022)   Exercise Vital Sign    Days of Exercise per Week: 0 days    Minutes of Exercise per Session: 0 min  Stress: Stress Concern Present (09/08/2022)   [employer] of Occupational Health - Occupational Stress Questionnaire    Feeling of Stress : To some extent  Social Connections: Moderately Integrated (09/08/2022)   Social Connection and Isolation Panel [NHANES]    Frequency of Communication with Friends and Family: More than three times a week    Frequency of Social Gatherings with Friends and Family: More than three times a week    Attends Religious Services: More than 4 times per year    Active Member of [employer] or Organizations: Yes    Attends Engineer, structural: More than 4 times per year    Marital Status: Divorced  Intimate Partner Violence: Not At Risk (09/08/2022)   Humiliation, Afraid, Rape, and Kick questionnaire    Fear of Current or Ex-Partner: No    Emotionally Abused: No    Physically Abused: No    Sexually Abused: No    Review of Systems:    Constitutional: No weight loss, fever, chills, weakness or fatigue HEENT: Eyes: No change in vision               Ears, Nose, Throat:  No change in hearing or congestion Skin: No rash or itching Cardiovascular: No chest  pain, chest pressure or palpitations   Respiratory: No SOB or cough Gastrointestinal: See HPI and otherwise negative Genitourinary: No dysuria or change in urinary frequency Neurological: No headache, dizziness or syncope Musculoskeletal: No new muscle or joint pain Hematologic: No bleeding or bruising Psychiatric: No history of depression or anxiety    Physical Exam:  Vital signs: There were no vitals taken for this visit.  Constitutional: NAD, Well developed, Well nourished, alert and cooperative.  Appears about 30 years younger than stated age Head:  Normocephalic and atraumatic. Eyes:   PEERL, EOMI. No icterus. Conjunctiva pink. Respiratory: Respirations even and unlabored. Lungs clear to auscultation bilaterally.   No wheezes, crackles, or rhonchi.  Cardiovascular:  Regular rate and rhythm. No peripheral edema, cyanosis or pallor.  Gastrointestinal:  Soft, nondistended, nontender. No rebound or guarding. Normal bowel sounds. No appreciable masses or hepatomegaly. Rectal:  Not performed.  Msk:  Symmetrical without gross deformities. Without edema, no deformity or joint abnormality.  Neurologic:  Alert and  oriented x4;  grossly normal neurologically.  Skin:   Dry and intact without significant lesions or rashes. Psychiatric: Oriented to person, place and time. Demonstrates good judgement and reason without abnormal affect or behaviors.   RELEVANT LABS  AND IMAGING: CBC    Component Value Date/Time   WBC 8.8 03/24/2023 1937   RBC 4.97 03/24/2023 1937   HGB 14.3 03/24/2023 1937   HGB 13.6 07/21/2018 1600   HCT 44.2 03/24/2023 1937   HCT 40.6 07/21/2018 1600   PLT 261 03/24/2023 1937   PLT 246 07/21/2018 1600   MCV 88.9 03/24/2023 1937   MCV 87 07/21/2018 1600   MCH 28.8 03/24/2023 1937   MCHC 32.4 03/24/2023 1937   RDW 15.9 (H) 03/24/2023 1937   RDW 16.1 (H) 07/21/2018 1600   LYMPHSABS 4.2 (H) 02/13/2023 1624   MONOABS 0.9 02/13/2023 1624   EOSABS 0.3 02/13/2023 1624    BASOSABS 0.1 02/13/2023 1624    CMP     Component Value Date/Time   NA 139 03/24/2023 1937   NA 139 07/31/2019 1418   K 3.5 03/24/2023 1937   CL 100 03/24/2023 1937   CO2 24 03/24/2023 1937   GLUCOSE 163 (H) 03/24/2023 1937   BUN 22 03/24/2023 1937   BUN 20 07/31/2019 1418   CREATININE 1.65 (H) 03/24/2023 1937   CREATININE 1.47 (H) 07/07/2020 1104   CALCIUM 9.7 03/24/2023 1937   PROT 7.8 07/05/2022 1527   PROT 6.9 11/10/2020 0926   ALBUMIN 4.1 07/05/2022 1527   ALBUMIN 4.3 09/11/2020 1227   AST 17 07/05/2022 1527   ALT 11 07/05/2022 1527   ALKPHOS 55 07/05/2022 1527   BILITOT 0.5 07/05/2022 1527   BILITOT 0.4 09/11/2020 1227   GFRNONAA 31 (L) 03/24/2023 1937   GFRNONAA 34 (L) 07/07/2020 1104   GFRAA 40 (L) 07/07/2020 1104     Assessment/Plan:   77 year old female history of CAD s/p PCI 2018, no longer on Plavix, presenting with dysphagia and GERD not adequately well-controlled on pantoprazole 40 Mg once daily.  Gastroesophageal reflux disease, unspecified whether esophagitis present Dysphagia, unspecified type Dysphagia with solids, sometimes pills, and increased GERD.  DDx includes gastritis, esophagitis, stricture, PUD.  - EGD with possible dilation for further evaluation - Based on EGD findings may consider increasing pantoprazole to 40 Mg twice daily - Educated patient on lifestyle modifications - I thoroughly discussed the procedure with the patient (at bedside) to include nature of the procedure, alternatives, benefits, and risks (including but not limited to bleeding, infection, perforation, anesthesia/cardiac pulmonary complications).  Patient verbalized understanding and gave verbal consent to proceed with procedure.   History of colonic polyps Due for repeat colonoscopy.  Appears much younger than stated age.  I believe she is an adequate candidate for repeat colonoscopy. - Schedule colonoscopy  Constipation Currently well-controlled on MiraLAX as  needed  [facility] 05/09/2023, 9:13 AM  Cc: [place], [name], MD

## 2023-05-17 ENCOUNTER — Ambulatory Visit (INDEPENDENT_AMBULATORY_CARE_PROVIDER_SITE_OTHER): Payer: Medicare HMO

## 2023-05-17 DIAGNOSIS — Z23 Encounter for immunization: Secondary | ICD-10-CM | POA: Diagnosis not present

## 2023-05-17 NOTE — Progress Notes (Signed)
Per orders of Dr. [place], injection of  high dose Flu  given by Stann Ore. Patient tolerated injection well.

## 2023-05-19 ENCOUNTER — Ambulatory Visit: Payer: Medicare HMO | Admitting: Psychology

## 2023-05-19 DIAGNOSIS — F330 Major depressive disorder, recurrent, mild: Secondary | ICD-10-CM | POA: Diagnosis not present

## 2023-05-19 NOTE — Progress Notes (Signed)
[facility] Counselor/Therapist Progress Note  Patient ID: [name], MRN: [id number],    Date: 05/19/2023  Time Spent: 2:30pm-3:30pm  pt is seen for an in person visit.     Treatment Type: Individual Therapy  Reported Symptoms: pt reports increased depressed mood this week.  Pt insight that related to recent losses.  Pt discussed positive interactions and engagement over the weekend.  Mental Status Exam: Appearance:  Well Groomed     Behavior: Appropriate  Motor: Normal  Speech/Language:  Normal Rate  Affect: Appropriate  Mood: depressed  Thought process: normal  Thought content:   WNL  Sensory/Perceptual disturbances:   WNL  Orientation: oriented to person, place, time/date, and situation  Attention: Good  Concentration: Good  Memory: WNL  Fund of knowledge:  Good  Insight:   Good  Judgment:  Good  Impulse Control: Good   Risk Assessment: Danger to Self:  No Self-injurious Behavior: No Danger to Others: No Duty to Warn:no Physical Aggression / Violence:No  Access to Firearms a concern: No  Gang Involvement:No   Subjective: Counselor assessed pt current functioning per pt report. Processed w/pt recent increase in depressed mood.  Assisted w/ identifying contributing factors.  Explored coping skills and engaging in activities to assist coping.  Pt affect congruent w/ report of feeling more down.  Pt reported that has been a tough week and felt more down, decreased engagement this week.  Pt aware of impact of son surgery to amputate finger, death of friend and church member and how brings up grief for her.  Pt reported that she did have a good weekend and enjoyed.  Pt reports she is planning to bake today when gets home and will be calling couple more friends.   Interventions: Cognitive Behavioral Therapy and behavioral activation  Diagnosis:Mild episode of recurrent major depressive disorder (HCC)  Plan: pt to f/u in 3-4 weeks for counseling.  Pt to f/u  as scheduled w/ PCP.  Pt reports has a referral to psychiatrist for medication eval oct 2024.     Individualized Treatment Plan Strengths: enjoys time w/ daughter, son and w/ extended family and church community  Supports: her daughter   Goal/Needs for Treatment:  In order of importance to patient 1) engaged in activities/ talk w/ friends 2) decreased depressed moods 3) ---   Client Statement of Needs: "Get out of this mood I'm in. "   Treatment Level:out pt counseling  Symptoms:depressed mood, tearfulness, loss of interest, difficulty w/ motivation, withdrawn from interacting.   Client Treatment Preferences:biweekly in person counseling.  Continue medication management w/ PCP.    Healthcare consumer's goal for treatment:  Counselor, Forde Radon, [facility] will support the patient's ability to achieve the goals identified. Cognitive Behavioral Therapy, Assertive Communication/Conflict Resolution Training, Relaxation Training, ACT, Humanistic and other evidenced-based practices will be used to promote progress towards healthy functioning.   Healthcare consumer will: Actively participate in therapy, working towards healthy functioning.    *Justification for Continuation/Discontinuation of Goal: R=Revised, O=Ongoing, A=Achieved, D=Discontinued  Goal 1)  Increased engagement w/ activities she enjoys and w/ social interactions AEB pt report and counselor observation. Baseline date 12/02/22: Progress towards goal 0; How Often - Daily Target Date Goal Was reviewed Status Code Progress towards goal/Likert rating  12/02/23                Goal 2) Increased coping w/ losses and w/ depressed moods AEB utilizing coping skills daily.   Baseline date 12/02/22: Progress towards goal 0;  How Often - Daily Target Date Goal Was reviewed Status Code Progress towards goal  12/02/23                  This plan has been reviewed and created by the following participants:  This plan will be reviewed at least  every 12 months. Date Behavioral Health Clinician Date Guardian/Patient   12/02/22  [facility] Ophelia [facility] 12/02/22 Verbal Consent Provided                            Forde Radon [facility]

## 2023-05-26 NOTE — Progress Notes (Signed)
Agree with the assessment and plan as outlined by Cira Servant, PA-C.  Agree with plan for EGD with esophageal dilation and/or biopsies as appropriate.  Also appropriate to trial course of high-dose PPI for diagnostic and therapeutic intent given breakthrough reflux symptoms and possible development of reflux burnout dysphagia.  If EGD unrevealing and still no improvement, can consider esophageal manometry.  Abagale Boulos, DO, [facility]

## 2023-05-26 NOTE — Addendum Note (Signed)
Addended by: Shellia Cleverly on: 05/26/2023 12:17 PM   Modules accepted: Level of Service

## 2023-06-02 ENCOUNTER — Other Ambulatory Visit: Payer: Self-pay | Admitting: Family Medicine

## 2023-06-02 DIAGNOSIS — I119 Hypertensive heart disease without heart failure: Secondary | ICD-10-CM

## 2023-06-03 ENCOUNTER — Encounter: Payer: Self-pay | Admitting: Gastroenterology

## 2023-06-07 DIAGNOSIS — M5416 Radiculopathy, lumbar region: Secondary | ICD-10-CM | POA: Diagnosis not present

## 2023-06-07 DIAGNOSIS — Z6832 Body mass index (BMI) 32.0-32.9, adult: Secondary | ICD-10-CM | POA: Diagnosis not present

## 2023-06-11 ENCOUNTER — Other Ambulatory Visit: Payer: Self-pay | Admitting: Family Medicine

## 2023-06-11 DIAGNOSIS — J3089 Other allergic rhinitis: Secondary | ICD-10-CM

## 2023-06-15 ENCOUNTER — Ambulatory Visit: Payer: Medicare HMO | Admitting: Gastroenterology

## 2023-06-15 ENCOUNTER — Encounter: Payer: Self-pay | Admitting: Gastroenterology

## 2023-06-15 ENCOUNTER — Other Ambulatory Visit: Payer: Self-pay | Admitting: Gastroenterology

## 2023-06-15 VITALS — BP 163/72 | HR 58 | Temp 96.8°F | Resp 22 | Ht 66.0 in | Wt 229.0 lb

## 2023-06-15 DIAGNOSIS — Z860100 Personal history of colon polyps, unspecified: Secondary | ICD-10-CM

## 2023-06-15 DIAGNOSIS — D125 Benign neoplasm of sigmoid colon: Secondary | ICD-10-CM | POA: Diagnosis not present

## 2023-06-15 DIAGNOSIS — D120 Benign neoplasm of cecum: Secondary | ICD-10-CM | POA: Diagnosis not present

## 2023-06-15 DIAGNOSIS — K219 Gastro-esophageal reflux disease without esophagitis: Secondary | ICD-10-CM | POA: Diagnosis not present

## 2023-06-15 DIAGNOSIS — K2950 Unspecified chronic gastritis without bleeding: Secondary | ICD-10-CM

## 2023-06-15 DIAGNOSIS — R1310 Dysphagia, unspecified: Secondary | ICD-10-CM | POA: Diagnosis not present

## 2023-06-15 DIAGNOSIS — K319 Disease of stomach and duodenum, unspecified: Secondary | ICD-10-CM | POA: Diagnosis not present

## 2023-06-15 DIAGNOSIS — Z09 Encounter for follow-up examination after completed treatment for conditions other than malignant neoplasm: Secondary | ICD-10-CM | POA: Diagnosis not present

## 2023-06-15 DIAGNOSIS — K2970 Gastritis, unspecified, without bleeding: Secondary | ICD-10-CM | POA: Diagnosis not present

## 2023-06-15 DIAGNOSIS — D122 Benign neoplasm of ascending colon: Secondary | ICD-10-CM

## 2023-06-15 DIAGNOSIS — I2510 Atherosclerotic heart disease of native coronary artery without angina pectoris: Secondary | ICD-10-CM | POA: Diagnosis not present

## 2023-06-15 DIAGNOSIS — G4733 Obstructive sleep apnea (adult) (pediatric): Secondary | ICD-10-CM | POA: Diagnosis not present

## 2023-06-15 DIAGNOSIS — J449 Chronic obstructive pulmonary disease, unspecified: Secondary | ICD-10-CM | POA: Diagnosis not present

## 2023-06-15 DIAGNOSIS — K641 Second degree hemorrhoids: Secondary | ICD-10-CM | POA: Diagnosis not present

## 2023-06-15 DIAGNOSIS — K5730 Diverticulosis of large intestine without perforation or abscess without bleeding: Secondary | ICD-10-CM | POA: Diagnosis not present

## 2023-06-15 DIAGNOSIS — Z860101 Personal history of adenomatous and serrated colon polyps: Secondary | ICD-10-CM | POA: Diagnosis not present

## 2023-06-15 DIAGNOSIS — K635 Polyp of colon: Secondary | ICD-10-CM | POA: Diagnosis not present

## 2023-06-15 MED ORDER — PANTOPRAZOLE SODIUM 40 MG PO TBEC: 40.0000 mg | DELAYED_RELEASE_TABLET | Freq: Two times a day (BID) | ORAL | 0 refills | Status: DC

## 2023-06-15 MED ORDER — SODIUM CHLORIDE 0.9 % IV SOLN: 500.0000 mL | Freq: Once | INTRAVENOUS | Status: DC

## 2023-06-15 NOTE — Progress Notes (Signed)
Pt states she has never taken a nitro

## 2023-06-15 NOTE — Progress Notes (Signed)
Called to room to assist during endoscopic procedure.  Patient ID and intended procedure confirmed with present staff. Received instructions for my participation in the procedure from the performing physician.  

## 2023-06-15 NOTE — Op Note (Signed)
[facility] Patient Name: [name] Procedure Date: 06/15/2023 2:25 PM MRN: [id number] Endoscopist: Doristine Locks , MD, [id number] Age: 77 Referring MD:  Date of Birth: September 26, 1945 Gender: Female Account #: [id number] Procedure:                Upper GI endoscopy Indications:              Dysphagia, Heartburn, Esophageal reflux Medicines:                Monitored Anesthesia Care Procedure:                Pre-Anesthesia Assessment:                           - Prior to the procedure, a History and Physical                            was performed, and patient medications and                            allergies were reviewed. The patient's tolerance of                            previous anesthesia was also reviewed. The risks                            and benefits of the procedure and the sedation                            options and risks were discussed with the patient.                            All questions were answered, and informed consent                            was obtained. Prior Anticoagulants: The patient has                            taken no anticoagulant or antiplatelet agents. ASA                            Grade Assessment: III - A patient with severe                            systemic disease. After reviewing the risks and                            benefits, the patient was deemed in satisfactory                            condition to undergo the procedure.                           After obtaining informed consent, the endoscope was  passed under direct vision. Throughout the                            procedure, the patient's blood pressure, pulse, and                            oxygen saturations were monitored continuously. The                            GIF [id number] #[id number] was introduced through the                            mouth, and advanced to the second part of duodenum.                            The upper GI  endoscopy was accomplished without                            difficulty. The patient tolerated the procedure                            well. Scope In: Scope Out: Findings:                 The examined esophagus was normal. A guidewire was                            placed and the scope was withdrawn. Dilation was                            performed with a Savary dilator with no resistance                            at 17 mm. The dilation site was examined following                            endoscope reinsertion and showed no bleeding,                            mucosal tear or perforation. Estimated blood loss:                            none.                           The Z-line was regular and was found 40 cm from the                            incisors.                           The gastroesophageal flap valve was visualized                            endoscopically and classified as Hill Grade II                            (  fold present, opens with respiration).                           Localized mild inflammation characterized by                            erythema was found in the gastric antrum. There was                            mucosal edema in the gastric body and fundus.                            Biopsies were taken throughout the stomach with a                            cold forceps for histology. Estimated blood loss                            was minimal.                           The examined duodenum was normal. Complications:            No immediate complications. Estimated Blood Loss:     Estimated blood loss was minimal. Impression:               - Normal esophagus. Dilated.                           - Z-line regular, 40 cm from the incisors.                           - Gastroesophageal flap valve classified as Hill                            Grade II (fold present, opens with respiration).                           - Mild, non-ulcer antral gastritis.  Biopsied.                           - Mildly edematous mucosa in the gastric body and                            fundus. Biopsied.                           - Normal examined duodenum. Recommendation:           - Patient has a contact number available for                            emergencies. The signs and symptoms of potential                            delayed complications were discussed with the  patient. Return to normal activities tomorrow.                            Written discharge instructions were provided to the                            patient.                           - Resume previous diet.                           - Continue present medications.                           - Await pathology results.                           - If symptoms persist, can potentially further                            evaluate with Esophageal Manometry +/- pH/impedance                            testing as appropriate.                           - Trial of increased acid suppression therapy:                            increase pantoprazole (Protonix) to 40 mg BID x4                            weeks, then back to daily dosing. Doristine Locks, MD 06/15/2023 3:09:50 PM

## 2023-06-15 NOTE — Progress Notes (Signed)
Report to PACU, RN, vss, BBS= Clear.  

## 2023-06-15 NOTE — Progress Notes (Signed)
GASTROENTEROLOGY PROCEDURE H&P NOTE   Primary Care Physician: [place], [name], MD    Reason for Procedure:  GERD, dysphagia, heartburn, history of colon polyps, IBS-C  Plan:    EGD, colonoscopy  Patient is appropriate for endoscopic procedure(s) in the ambulatory ([facility]) setting.  The nature of the procedure, as well as the risks, benefits, and alternatives were carefully and thoroughly reviewed with the patient. Ample time for discussion and questions allowed. The patient understood, was satisfied, and agreed to proceed.     HPI: [name] is a 77 y.o. female who presents for EGD for evaluation of dysphagia, GERD, heartburn, along with colonoscopy for ongoing polyp surveillance and known history of IBS-C.  Reflux currently treated with pantoprazole 40 mg daily and Pepcid 20 mg and Pepcid 20 mg at bedtime, but has been having increasing breakthrough heartburn.  Also with recurrence of dysphagia.  Last EGD in 01/2020 notable for mild gastritis.  Last colonoscopy was 01/2020 and notable for 5 subcentimeter adenomas and left-sided diverticulosis with recommendation to repeat in 3 years.  Past Medical History:  Diagnosis Date   Allergy    Anxiety    C. difficile diarrhea    history of   CAD (coronary artery disease), native coronary artery    a. 05/2017 showed 100% mLCx tx with DES, otherwise 20% mRCA, 50% ramus, 30% dLAD, 20% prox-mid LAD, 20% mLAD treated medically.    Cancer [facility])    Right kidney CA removed.    CKD (chronic kidney disease) stage 3, GFR 30-59 ml/min (HCC) 10/11/2016   s/p R nephrectomy   Colon polyps 2008   HYPERPLASTIC   COPD (chronic obstructive pulmonary disease) (HCC)    former smoker   Depression    Gastritis    GERD (gastroesophageal reflux disease)    History of echocardiogram    Echo 3/18:  Moderate LVH, EF 60-65, grade 1 diastolic dysfunction, calcified aortic valve, mild MR, moderate LAE   History of nuclear stress test    Myoview 3/18: Mod  size and intensity fixed septal defect, may be artifact.Opposite mod size and intensity lat defect, which is reversible and could represent ischemia or possibly artifact (SDS 4). LVEF 71% with normal wall motion. Intermediate risk study. >> images reviewed with Dr. Dietrich Pates - no sig ischemia; med rx    Hx of cardiovascular stress test    Lexiscan Myoview 6/16:  EF 70%, no scar or ischemia; Low Risk   Hyperlipidemia    Hypertension    Myocardial infarction (HCC)    2018   Neuropathy    Orthostatic hypotension 05/28/2017   OSA (obstructive sleep apnea) 01/16/2021   Osteoarthritis    Pre-diabetes    no meds   Rheumatoid arthritis (HCC)    RA (Dr. Dareen Piano) Bilateral hands   S/P angioplasty with stent 05/27/17 to LCX with DES  05/28/2017   Sleep apnea 2023   does not use CPAP   Thyroid disease     Past Surgical History:  Procedure Laterality Date   ABDOMINAL HYSTERECTOMY  1978   ANTERIOR CERVICAL DECOMP/DISCECTOMY FUSION N/A 05/29/2018   Procedure: ANTERIOR CERVICAL DECOMPRESSION FUSION - CERVICAL FIVE-CERVICAL SIX - CERVICAL SIX-CERVICAL SEVEN;  Surgeon: Julio Sicks, MD;  Location: [facility] OR;  Service: Neurosurgery;  Laterality: N/A;   BACK SURGERY     x 2   BREAST LUMPECTOMY WITH RADIOACTIVE SEED LOCALIZATION Right 04/13/2022   Procedure: RIGHT BREAST SEED LUMPECTOMY;  Surgeon: Harriette Bouillon, MD;  Location: [facility];  Service: General;  Laterality: Right;   CARPAL TUNNEL RELEASE Left    COLONOSCOPY     COLONOSCOPY W/ BIOPSIES AND POLYPECTOMY     Hx: of   CORONARY STENT INTERVENTION N/A 05/27/2017   Procedure: CORONARY STENT INTERVENTION;  Surgeon: Kathleene Hazel, MD;  Location: [facility] INVASIVE CV LAB;  Service: Cardiovascular;  Laterality: N/A;   ESOPHAGOGASTRODUODENOSCOPY     HNP     LEFT HEART CATH AND CORONARY ANGIOGRAPHY N/A 05/27/2017   Procedure: LEFT HEART CATH AND CORONARY ANGIOGRAPHY;  Surgeon: Kathleene Hazel, MD;  Location: [facility] INVASIVE CV  LAB;  Service: Cardiovascular;  Laterality: N/A;   LUMBAR LAMINECTOMY/DECOMPRESSION MICRODISCECTOMY Left 02/12/2013   Procedure: LUMBAR TWO THREE, LUMBAR THREE FOUR, LUMBAR FOUR FIVE  LAMINECTOMY/DECOMPRESSION MICRODISCECTOMY 3 LEVELS;  Surgeon: Temple Pacini, MD;  Location: [facility] NEURO ORS;  Service: Neurosurgery;  Laterality: Left;   NEPHRECTOMY Right 2010   10.rcc cancer   TOTAL KNEE ARTHROPLASTY Right    Redo   UPPER GASTROINTESTINAL ENDOSCOPY      Prior to Admission medications   Medication Sig Start Date End Date Taking? Authorizing Provider  acetaminophen (TYLENOL) 500 MG tablet Take 1,000 mg by mouth as needed for moderate pain or headache.    Yes [provider]  aspirin 81 MG chewable tablet Chew 81 mg by mouth daily.   Yes [provider]  Budeson-Glycopyrrol-Formoterol (BREZTRI AEROSPHERE) 160-9-4.8 MCG/ACT AERO Inhale 2 puffs into the lungs in the morning and at bedtime. 07/22/22  Yes Young, Clinton D, MD  Evolocumab (REPATHA SURECLICK) 140 MG/ML SOAJ Inject 140 mg into the skin every 14 (fourteen) days. 10/27/22  Yes Pricilla Riffle, MD  fluticasone (FLONASE) 50 MCG/ACT nasal spray PLACE 2 SPRAYS INTO BOTH NOSTRILS AT BEDTIME AS NEEDED FOR ALLERGIES. 06/13/23  Yes [place], [name], MD  gabapentin (NEURONTIN) 300 MG capsule TAKE 1 CAPSULE BY MOUTH EVERY DAY AT BEDTIME 04/05/23  Yes Penumalli, Vikram R, MD  hydrochlorothiazide (HYDRODIURIL) 25 MG tablet TAKE 1 TABLET BY MOUTH ONCE DAILY 06/03/23  Yes [place], [name], MD  losartan (COZAAR) 100 MG tablet TAKE 1 TABLET BY MOUTH EVERY MORNING 06/03/23  Yes [place], [name], MD  metoprolol succinate (TOPROL-XL) 100 MG 24 hr tablet TAKE 1 TABLET BY MOUTH AT NOON 06/03/23  Yes [place], [name], MD  montelukast (SINGULAIR) 10 MG tablet TAKE ONE TABLET BY MOUTH EVERYDAY AT BEDTIME 03/22/22  Yes [place], [name], MD  pantoprazole (PROTONIX) 40 MG tablet TAKE ONE TABLET BY MOUTH ONCE DAILY 10/18/22  Yes Tailyn Hantz V, DO  rosuvastatin  (CRESTOR) 10 MG tablet Take 1 tablet (10 mg total) by mouth daily. 01/18/23 01/18/24 Yes Pricilla Riffle, MD  sertraline (ZOLOFT) 50 MG tablet TAKE ONE TABLET BY MOUTH AT NOON 11/17/22  Yes [place], [name], MD  triamcinolone cream (KENALOG) 0.1 % Apply 1 Application topically 3 times/day as needed-between meals & bedtime. 11/17/22  Yes [place], [name], MD  albuterol (VENTOLIN HFA) 108 (90 Base) MCG/ACT inhaler INHALE ONE OR TWO PUFFS BY MOUTH INTO THE LUNGS EVERY SIX HOURS AS NEEDED FOR WHEEZING OR SHORTNESS OF BREATH Patient not taking: Reported on 06/15/2023 04/20/22   Waymon Budge, MD  diazepam (VALIUM) 5 MG tablet Take 1 tablet by oral route 45 minutes prior to scheduled MRI. Repeat after 20 minutes if needed. Arrange transportation to and from MRI. Patient not taking: Reported on 06/15/2023 02/24/23   [provider]  famotidine (PEPCID) 20 MG tablet TAKE ONE TABLET  BY MOUTH EVERY MORNING and TAKE ONE TABLET BY MOUTH EVERYDAY AT BEDTIME 03/16/23   [place], [name], MD  meclizine (ANTIVERT) 25 MG tablet Take 1 tablet (25 mg total) by mouth 3 (three) times daily as needed for dizziness. 02/13/23   Henderly, Britni A, PA-C  MYRBETRIQ 50 MG TB24 tablet Take 50 mg by mouth daily. Patient not taking: Reported on 06/15/2023 10/29/21   [provider]  nitroGLYCERIN (NITROSTAT) 0.4 MG SL tablet Place 1 tablet (0.4 mg total) under the tongue every 5 (five) minutes as needed for chest pain. Patient not taking: Reported on 06/15/2023 05/26/21   Pricilla Riffle, MD  ondansetron (ZOFRAN-ODT) 4 MG disintegrating tablet 4mg  ODT q4 hours prn nausea/vomit 08/01/21   Melene Plan, DO    Current Outpatient Medications  Medication Sig Dispense Refill   acetaminophen (TYLENOL) 500 MG tablet Take 1,000 mg by mouth as needed for moderate pain or headache.      aspirin 81 MG chewable tablet Chew 81 mg by mouth daily.     Budeson-Glycopyrrol-Formoterol (BREZTRI AEROSPHERE) 160-9-4.8 MCG/ACT AERO Inhale 2 puffs  into the lungs in the morning and at bedtime. 2 each 11   Evolocumab (REPATHA SURECLICK) 140 MG/ML SOAJ Inject 140 mg into the skin every 14 (fourteen) days. 2 mL 11   fluticasone (FLONASE) 50 MCG/ACT nasal spray PLACE 2 SPRAYS INTO BOTH NOSTRILS AT BEDTIME AS NEEDED FOR ALLERGIES. 48 mL 1   gabapentin (NEURONTIN) 300 MG capsule TAKE 1 CAPSULE BY MOUTH EVERY DAY AT BEDTIME 30 capsule 2   hydrochlorothiazide (HYDRODIURIL) 25 MG tablet TAKE 1 TABLET BY MOUTH ONCE DAILY 90 tablet 2   losartan (COZAAR) 100 MG tablet TAKE 1 TABLET BY MOUTH EVERY MORNING 90 tablet 2   metoprolol succinate (TOPROL-XL) 100 MG 24 hr tablet TAKE 1 TABLET BY MOUTH AT NOON 90 tablet 2   montelukast (SINGULAIR) 10 MG tablet TAKE ONE TABLET BY MOUTH EVERYDAY AT BEDTIME 30 tablet 3   pantoprazole (PROTONIX) 40 MG tablet TAKE ONE TABLET BY MOUTH ONCE DAILY 90 tablet 3   rosuvastatin (CRESTOR) 10 MG tablet Take 1 tablet (10 mg total) by mouth daily. 90 tablet 3   sertraline (ZOLOFT) 50 MG tablet TAKE ONE TABLET BY MOUTH AT NOON 90 tablet 2   triamcinolone cream (KENALOG) 0.1 % Apply 1 Application topically 3 times/day as needed-between meals & bedtime. 30 g 1   albuterol (VENTOLIN HFA) 108 (90 Base) MCG/ACT inhaler INHALE ONE OR TWO PUFFS BY MOUTH INTO THE LUNGS EVERY SIX HOURS AS NEEDED FOR WHEEZING OR SHORTNESS OF BREATH (Patient not taking: Reported on 06/15/2023) 8.5 g 12   diazepam (VALIUM) 5 MG tablet Take 1 tablet by oral route 45 minutes prior to scheduled MRI. Repeat after 20 minutes if needed. Arrange transportation to and from MRI. (Patient not taking: Reported on 06/15/2023)     famotidine (PEPCID) 20 MG tablet TAKE ONE TABLET BY MOUTH EVERY MORNING and TAKE ONE TABLET BY MOUTH EVERYDAY AT BEDTIME 180 tablet 1   meclizine (ANTIVERT) 25 MG tablet Take 1 tablet (25 mg total) by mouth 3 (three) times daily as needed for dizziness. 30 tablet 0   MYRBETRIQ 50 MG TB24 tablet Take 50 mg by mouth daily. (Patient not taking:  Reported on 06/15/2023)     nitroGLYCERIN (NITROSTAT) 0.4 MG SL tablet Place 1 tablet (0.4 mg total) under the tongue every 5 (five) minutes as needed for chest pain. (Patient not taking: Reported on 06/15/2023) 25 tablet 2  ondansetron (ZOFRAN-ODT) 4 MG disintegrating tablet 4mg  ODT q4 hours prn nausea/vomit 20 tablet 0   Current Facility-Administered Medications  Medication Dose Route Frequency Provider Last Rate Last Admin   0.9 %  sodium chloride infusion  500 mL Intravenous Once Yailin Biederman V, DO        Allergies as of 06/15/2023 - Review Complete 06/15/2023  Allergen Reaction Noted   Oxycodone-acetaminophen Hives and Itching 11/04/2014   Percocet [oxycodone-acetaminophen] Hives, Itching, and Other (See Comments) 11/04/2014   Pravastatin sodium Other (See Comments) 05/03/2017   Hydrocodone Other (See Comments) 12/04/2014   Aspirin Other (See Comments)     Family History  Problem Relation Age of Onset   Rheum arthritis Mother    Stroke Mother    Prostate cancer Father    Heart disease Father    Diabetes Brother    Dementia Brother    Parkinsonism Brother    Diabetes Brother    Cancer Brother    Dementia Brother    Kidney disease Son    Bipolar disorder Daughter    Colon cancer Neg Hx    Esophageal cancer Neg Hx    Pancreatic cancer Neg Hx    Liver disease Neg Hx    Rectal cancer Neg Hx    Stomach cancer Neg Hx     Social History   Socioeconomic History   Marital status: Divorced    Spouse name: Not on file   Number of children: 3   Years of education: 12   Highest education level: Not on file  Occupational History   Occupation: Retired    Associate Professor: RETIRED  Tobacco Use   Smoking status: Former    Current packs/day: 0.00    Average packs/day: 0.2 packs/day for 53.1 years (10.6 ttl pk-yrs)    Types: Cigarettes    Start date: 54    Quit date: 09/07/2021    Years since quitting: 1.7   Smokeless tobacco: Never   Tobacco comments:    pt has been an on  and off smoker since age 42  Vaping Use   Vaping status: Never Used  Substance and Sexual Activity   Alcohol use: No    Alcohol/week: 0.0 standard drinks of alcohol   Drug use: No   Sexual activity: Not Currently    Birth control/protection: Surgical    Comment: hyst  Other Topics Concern   Not on file  Social History Narrative   Single, dgtr lives with her   Current on/off smoker   Alcohol use- no   Drug use-no   Regular Exercise-yes   Social Determinants of Health   Financial Resource Strain: Low Risk  (09/08/2022)   Overall Financial Resource Strain (CARDIA)    Difficulty of Paying Living Expenses: Not hard at all  Food Insecurity: No Food Insecurity (10/20/2022)   Hunger Vital Sign    Worried About Running Out of Food in the Last Year: Never true    Ran Out of Food in the Last Year: Never true  Transportation Needs: No Transportation Needs (09/08/2022)   PRAPARE - Administrator, Civil Service (Medical): No    Lack of Transportation (Non-Medical): No  Physical Activity: Inactive (09/08/2022)   Exercise Vital Sign    Days of Exercise per Week: 0 days    Minutes of Exercise per Session: 0 min  Stress: Stress Concern Present (09/08/2022)   [employer] of Occupational Health - Occupational Stress Questionnaire    Feeling of Stress : To some extent  Social Connections: Moderately Integrated (09/08/2022)   Social Connection and Isolation Panel [NHANES]    Frequency of Communication with Friends and Family: More than three times a week    Frequency of Social Gatherings with Friends and Family: More than three times a week    Attends Religious Services: More than 4 times per year    Active Member of [employer] or Organizations: Yes    Attends Engineer, structural: More than 4 times per year    Marital Status: Divorced  Intimate Partner Violence: Not At Risk (09/08/2022)   Humiliation, Afraid, Rape, and Kick questionnaire    Fear of Current or Ex-Partner: No     Emotionally Abused: No    Physically Abused: No    Sexually Abused: No    Physical Exam: Vital signs in last 24 hours: @BP  (!) 185/83   Pulse 62   Temp (!) 96.8 F (36 C)   Ht 5\' 6"  (1.676 m)   Wt 229 lb (103.9 kg)   SpO2 94%   BMI 36.96 kg/m  GEN: NAD EYE: Sclerae anicteric ENT: MMM CV: Non-tachycardic Pulm: CTA b/l GI: Soft, NT/ND NEURO:  Alert & Oriented x 3   Doristine Locks, DO [facility]   06/15/2023 2:18 PM

## 2023-06-15 NOTE — Patient Instructions (Signed)
**  Handouts given on polyps, hemorrhoids and diverticulosis**  **Start taking protonix twice a day for 4 weeks and then back to once a day**   YOU HAD AN ENDOSCOPIC PROCEDURE TODAY AT THE [facility]:   Refer to the procedure report that was given to you for any specific questions about what was found during the examination.  If the procedure report does not answer your questions, please call your gastroenterologist to clarify.  If you requested that your care partner not be given the details of your procedure findings, then the procedure report has been included in a sealed envelope for you to review at your convenience later.  YOU SHOULD EXPECT: Some feelings of bloating in the abdomen. Passage of more gas than usual.  Walking can help get rid of the air that was put into your GI tract during the procedure and reduce the bloating. If you had a lower endoscopy (such as a colonoscopy or flexible sigmoidoscopy) you may notice spotting of blood in your stool or on the toilet paper. If you underwent a bowel prep for your procedure, you may not have a normal bowel movement for a few days.  Please Note:  You might notice some irritation and congestion in your nose or some drainage.  This is from the oxygen used during your procedure.  There is no need for concern and it should clear up in a day or so.  SYMPTOMS TO REPORT IMMEDIATELY:  Following lower endoscopy (colonoscopy or flexible sigmoidoscopy):  Excessive amounts of blood in the stool  Significant tenderness or worsening of abdominal pains  Swelling of the abdomen that is new, acute  Fever of 100F or higher  Following upper endoscopy (EGD)  Vomiting of blood or coffee ground material  New chest pain or pain under the shoulder blades  Painful or persistently difficult swallowing  New shortness of breath  Fever of 100F or higher  Black, tarry-looking stools  For urgent or emergent issues, a gastroenterologist can be reached at  any hour by calling [phone]. Do not use MyChart messaging for urgent concerns.    DIET:  We do recommend a small meal at first, but then you may proceed to your regular diet.  Drink plenty of fluids but you should avoid alcoholic beverages for 24 hours.  ACTIVITY:  You should plan to take it easy for the rest of today and you should NOT DRIVE or use heavy machinery until tomorrow (because of the sedation medicines used during the test).    FOLLOW UP: Our staff will call the number listed on your records the next business day following your procedure.  We will call around 7:15- 8:00 am to check on you and address any questions or concerns that you may have regarding the information given to you following your procedure. If we do not reach you, we will leave a message.     If any biopsies were taken you will be contacted by phone or by letter within the next 1-3 weeks.  Please call us at [phone] if you have not heard about the biopsies in 3 weeks.    SIGNATURES/CONFIDENTIALITY: You and/or your care partner have signed paperwork which will be entered into your electronic medical record.  These signatures attest to the fact that that the information above on your After Visit Summary has been reviewed and is understood.  Full responsibility of the confidentiality of this discharge information lies with you and/or your care-partner.

## 2023-06-15 NOTE — Op Note (Signed)
[facility] Patient Name: [name] Procedure Date: 06/15/2023 2:24 PM MRN: [id number] Endoscopist: Doristine Locks , MD, [id number] Age: 77 Referring MD:  Date of Birth: 02-16-1946 Gender: Female Account #: [id number] Procedure:                Colonoscopy Indications:              Surveillance: Personal history of adenomatous                            polyps on last colonoscopy 3 years ago                           Last colonoscopy was 01/2020 and notable for 5                            subcentimeter adenomas and left-sided                            diverticulosis with recommendation to repeat in 3                            years. Medicines:                Monitored Anesthesia Care Procedure:                Pre-Anesthesia Assessment:                           - Prior to the procedure, a History and Physical                            was performed, and patient medications and                            allergies were reviewed. The patient's tolerance of                            previous anesthesia was also reviewed. The risks                            and benefits of the procedure and the sedation                            options and risks were discussed with the patient.                            All questions were answered, and informed consent                            was obtained. Prior Anticoagulants: The patient has                            taken no anticoagulant or antiplatelet agents. ASA  Grade Assessment: III - A patient with severe                            systemic disease. After reviewing the risks and                            benefits, the patient was deemed in satisfactory                            condition to undergo the procedure.                           After obtaining informed consent, the colonoscope                            was passed under direct vision. Throughout the                             procedure, the patient's blood pressure, pulse, and                            oxygen saturations were monitored continuously. The                            CF [id number] #[id number] was introduced through the anus                            and advanced to the the cecum, identified by                            appendiceal orifice and ileocecal valve. The                            colonoscopy was performed without difficulty. The                            patient tolerated the procedure well. The quality                            of the bowel preparation was good. The ileocecal                            valve, appendiceal orifice, and rectum were                            photographed. Scope In: 2:41:26 PM Scope Out: 3:01:00 PM Scope Withdrawal Time: 0 hours 9 minutes 20 seconds  Total Procedure Duration: 0 hours 19 minutes 34 seconds  Findings:                 The perianal and digital rectal examinations were                            normal.  Two sessile polyps were found in the ascending                            colon and cecum. The polyps were 2 to 5 mm in size.                            These polyps were removed with a cold snare.                            Resection and retrieval were complete. Estimated                            blood loss was minimal.                           Two sessile polyps were found in the sigmoid colon.                            The polyps were 3 to 4 mm in size. These polyps                            were removed with a cold snare. Resection and                            retrieval were complete. Estimated blood loss was                            minimal.                           A few small-mouthed diverticula were found in the                            sigmoid colon and descending colon.                           Non-bleeding internal hemorrhoids were found during                            retroflexion. The  hemorrhoids were small.                           The ascending colon revealed significantly                            excessive looping. Advancing the scope required                            using manual pressure. Complications:            No immediate complications. Estimated Blood Loss:     Estimated blood loss was minimal. Impression:               - Two 2 to 5 mm polyps in the ascending colon and  in the cecum, removed with a cold snare. Resected                            and retrieved.                           - Two 3 to 4 mm polyps in the sigmoid colon,                            removed with a cold snare. Resected and retrieved.                           - Diverticulosis in the sigmoid colon and in the                            descending colon.                           - Non-bleeding internal hemorrhoids.                           - There was significant looping of the colon. Recommendation:           - Patient has a contact number available for                            emergencies. The signs and symptoms of potential                            delayed complications were discussed with the                            patient. Return to normal activities tomorrow.                            Written discharge instructions were provided to the                            patient.                           - Resume previous diet.                           - Continue present medications.                           - Await pathology results.                           - Repeat colonoscopy for surveillance based on                            pathology results.                           - Return to  GI office PRN. Doristine Locks, MD 06/15/2023 3:14:00 PM

## 2023-06-16 ENCOUNTER — Telehealth: Payer: Self-pay

## 2023-06-16 ENCOUNTER — Ambulatory Visit: Payer: Medicare HMO | Admitting: Psychology

## 2023-06-16 DIAGNOSIS — F330 Major depressive disorder, recurrent, mild: Secondary | ICD-10-CM | POA: Diagnosis not present

## 2023-06-16 NOTE — Telephone Encounter (Signed)
  Follow up Call-     06/15/2023    1:53 PM  Call back number  Post procedure Call Back phone  # [phone]  Permission to leave phone message Yes     Patient questions:  Do you have a fever, pain , or abdominal swelling? No. Pain Score  0 *  Have you tolerated food without any problems? Yes.    Have you been able to return to your normal activities? Yes.    Do you have any questions about your discharge instructions: Diet   No. Medications  No. Follow up visit  No.  Do you have questions or concerns about your Care? No.  Actions: * If pain score is 4 or above: No action needed, pain <4.

## 2023-06-16 NOTE — Progress Notes (Signed)
[facility] Counselor/Therapist Progress Note  Patient ID: [name], MRN: [id number],    Date: 06/16/2023  Time Spent: 1:35pm-2:35pm  pt is seen for an in person visit.     Treatment Type: Individual Therapy  Reported Symptoms: pt reports positives w/ engaging w/ friends.    Mental Status Exam: Appearance:  Well Groomed     Behavior: Appropriate  Motor: Normal  Speech/Language:  Normal Rate  Affect: Appropriate  Mood: depressed  Thought process: normal  Thought content:   WNL  Sensory/Perceptual disturbances:   WNL  Orientation: oriented to person, place, time/date, and situation  Attention: Good  Concentration: Good  Memory: WNL  Fund of knowledge:  Good  Insight:   Good  Judgment:  Good  Impulse Control: Good   Risk Assessment: Danger to Self:  No Self-injurious Behavior: No Danger to Others: No Duty to Warn:no Physical Aggression / Violence:No  Access to Firearms a concern: No  Gang Involvement:No   Subjective: Counselor assessed pt current functioning per pt report. Processed w/pt mood and engagement.  Reflected positives of increased interactions w/ friends/phone calls.  Dicussed benefit of for mood. Explored feelings re: lack of contact from extended family and decision making about her response.  Pt affect wnl.  Pt reported has been feeling tired today w/ rain and w/ having colonoscopy yesterday.  Pt reported that she is enjoying talking w/ friends on the phone and that has been a positive.  Pt reported that some stressors w/ her health- hip, her son and daughter's health issues.  Pt discussed how hasn't heard from her sister and her nieces much anymore.  Pt expressed feeling hurt by and considering level of contact she wants to initiate or not.    Interventions: Cognitive Behavioral Therapy and behavioral activation  Diagnosis:Mild episode of recurrent major depressive disorder (HCC)  Plan: pt to f/u in 3-4 weeks for counseling.  Pt to f/u as  scheduled w/ PCP.  Pt reports has a referral to psychiatrist for medication eval Nov 2024.     Individualized Treatment Plan Strengths: enjoys time w/ daughter, son and w/ extended family and church community  Supports: her daughter   Goal/Needs for Treatment:  In order of importance to patient 1) engaged in activities/ talk w/ friends 2) decreased depressed moods 3) ---   Client Statement of Needs: "Get out of this mood I'm in. "   Treatment Level:out pt counseling  Symptoms:depressed mood, tearfulness, loss of interest, difficulty w/ motivation, withdrawn from interacting.   Client Treatment Preferences:biweekly in person counseling.  Continue medication management w/ PCP.    Healthcare consumer's goal for treatment:  Counselor, Forde Radon, [facility] will support the patient's ability to achieve the goals identified. Cognitive Behavioral Therapy, Assertive Communication/Conflict Resolution Training, Relaxation Training, ACT, Humanistic and other evidenced-based practices will be used to promote progress towards healthy functioning.   Healthcare consumer will: Actively participate in therapy, working towards healthy functioning.    *Justification for Continuation/Discontinuation of Goal: R=Revised, O=Ongoing, A=Achieved, D=Discontinued  Goal 1)  Increased engagement w/ activities she enjoys and w/ social interactions AEB pt report and counselor observation. Baseline date 12/02/22: Progress towards goal 0; How Often - Daily Target Date Goal Was reviewed Status Code Progress towards goal/Likert rating  12/02/23                Goal 2) Increased coping w/ losses and w/ depressed moods AEB utilizing coping skills daily.   Baseline date 12/02/22: Progress towards goal 0; How  Often - Daily Target Date Goal Was reviewed Status Code Progress towards goal  12/02/23                  This plan has been reviewed and created by the following participants:  This plan will be reviewed at least  every 12 months. Date Behavioral Health Clinician Date Guardian/Patient   12/02/22  [facility] Ophelia [facility] 12/02/22 Verbal Consent Provided                             Forde Radon [facility]

## 2023-06-20 LAB — SURGICAL PATHOLOGY

## 2023-06-26 NOTE — Progress Notes (Unsigned)
Cardiology Office Note   Date:  06/27/2023   ID:  Mersadies, Leak 1945/10/25, MRN [id number]  PCP:  [place], [name], MD  Cardiologist:   Dietrich Pates, MD   Patient presents for f/u of CAD and HTN      History of Present Illness: YVON PARENTI is a 77 y.o. female with a history of CAD The patient presented with CP in 2018.   CT coronary angiogram done   with FFR 0.82 mid/LCx; 0.78 distal LC.  Cardac cath showed:  LAD 20%; Ramus 50%;  RCA 20% LCx 100%  The pt underent PTCA/DES to LCx   .  Patient also has a history of hypertension, hyperlipidemia, diabetes, chronic kidney disease (status post right nephrectomy), rheumatoid arthritis, OSA and esophageal stricture  Jan 2022  Admitted with CP    Myoview done   No ischemia      I saw the pt in Feb 2024  She was seen by Inda Coke in the interval  Since seen she denies CP  Breathing is fair  She   has inhalers  Denies palpitations    No dizzinesss   Bothered by back pain   Lmits her activity  Current Meds  Medication Sig   acetaminophen (TYLENOL) 500 MG tablet Take 1,000 mg by mouth as needed for moderate pain or headache.    aspirin 81 MG chewable tablet Chew 81 mg by mouth daily.   Budeson-Glycopyrrol-Formoterol (BREZTRI AEROSPHERE) 160-9-4.8 MCG/ACT AERO Inhale 2 puffs into the lungs in the morning and at bedtime.   chlorhexidine (PERIDEX) 0.12 % solution as needed (dental work).   Evolocumab (REPATHA SURECLICK) 140 MG/ML SOAJ Inject 140 mg into the skin every 14 (fourteen) days.   famotidine (PEPCID) 20 MG tablet TAKE ONE TABLET BY MOUTH EVERY MORNING and TAKE ONE TABLET BY MOUTH EVERYDAY AT BEDTIME   fluticasone (FLONASE) 50 MCG/ACT nasal spray PLACE 2 SPRAYS INTO BOTH NOSTRILS AT BEDTIME AS NEEDED FOR ALLERGIES.   gabapentin (NEURONTIN) 300 MG capsule TAKE 1 CAPSULE BY MOUTH EVERY DAY AT BEDTIME   hydrochlorothiazide (HYDRODIURIL) 25 MG tablet TAKE 1 TABLET BY MOUTH ONCE DAILY   ibuprofen (ADVIL) 600 MG tablet Take 600 mg by mouth  every 6 (six) hours as needed.   losartan (COZAAR) 100 MG tablet TAKE 1 TABLET BY MOUTH EVERY MORNING   meclizine (ANTIVERT) 25 MG tablet Take 1 tablet (25 mg total) by mouth 3 (three) times daily as needed for dizziness.   metoprolol succinate (TOPROL-XL) 100 MG 24 hr tablet TAKE 1 TABLET BY MOUTH AT NOON   montelukast (SINGULAIR) 10 MG tablet TAKE ONE TABLET BY MOUTH EVERYDAY AT BEDTIME   MYRBETRIQ 50 MG TB24 tablet Take 50 mg by mouth daily.   nitroGLYCERIN (NITROSTAT) 0.4 MG SL tablet Place 1 tablet (0.4 mg total) under the tongue every 5 (five) minutes as needed for chest pain.   ondansetron (ZOFRAN-ODT) 4 MG disintegrating tablet 4mg  ODT q4 hours prn nausea/vomit   pantoprazole (PROTONIX) 40 MG tablet Take 1 tablet twice daily for 4 weeks, then reduce to once daily dosing.   rosuvastatin (CRESTOR) 10 MG tablet Take 1 tablet (10 mg total) by mouth daily.   sertraline (ZOLOFT) 50 MG tablet TAKE ONE TABLET BY MOUTH AT NOON   triamcinolone cream (KENALOG) 0.1 % Apply 1 Application topically 3 times/day as needed-between meals & bedtime.     Allergies:   Oxycodone-acetaminophen, Percocet [oxycodone-acetaminophen], Pravastatin sodium, Hydrocodone, and Aspirin   Past Medical History:  Diagnosis Date   Allergy    Anxiety    C. difficile diarrhea    history of   CAD (coronary artery disease), native coronary artery    a. 05/2017 showed 100% mLCx tx with DES, otherwise 20% mRCA, 50% ramus, 30% dLAD, 20% prox-mid LAD, 20% mLAD treated medically.    Cancer [facility])    Right kidney CA removed.    CKD (chronic kidney disease) stage 3, GFR 30-59 ml/min (HCC) 10/11/2016   s/p R nephrectomy   Colon polyps 2008   HYPERPLASTIC   COPD (chronic obstructive pulmonary disease) (HCC)    former smoker   Depression    Gastritis    GERD (gastroesophageal reflux disease)    History of echocardiogram    Echo 3/18:  Moderate LVH, EF 60-65, grade 1 diastolic dysfunction, calcified aortic valve, mild MR,  moderate LAE   History of nuclear stress test    Myoview 3/18: Mod size and intensity fixed septal defect, may be artifact.Opposite mod size and intensity lat defect, which is reversible and could represent ischemia or possibly artifact (SDS 4). LVEF 71% with normal wall motion. Intermediate risk study. >> images reviewed with Dr. Dietrich Pates - no sig ischemia; med rx    Hx of cardiovascular stress test    Lexiscan Myoview 6/16:  EF 70%, no scar or ischemia; Low Risk   Hyperlipidemia    Hypertension    Myocardial infarction (HCC)    2018   Neuropathy    Orthostatic hypotension 05/28/2017   OSA (obstructive sleep apnea) 01/16/2021   Osteoarthritis    Pre-diabetes    no meds   Rheumatoid arthritis (HCC)    RA (Dr. Dareen Piano) Bilateral hands   S/P angioplasty with stent 05/27/17 to LCX with DES  05/28/2017   Sleep apnea 2023   does not use CPAP   Thyroid disease     Past Surgical History:  Procedure Laterality Date   ABDOMINAL HYSTERECTOMY  1978   ANTERIOR CERVICAL DECOMP/DISCECTOMY FUSION N/A 05/29/2018   Procedure: ANTERIOR CERVICAL DECOMPRESSION FUSION - CERVICAL FIVE-CERVICAL SIX - CERVICAL SIX-CERVICAL SEVEN;  Surgeon: Julio Sicks, MD;  Location: [facility] OR;  Service: Neurosurgery;  Laterality: N/A;   BACK SURGERY     x 2   BREAST LUMPECTOMY WITH RADIOACTIVE SEED LOCALIZATION Right 04/13/2022   Procedure: RIGHT BREAST SEED LUMPECTOMY;  Surgeon: Harriette Bouillon, MD;  Location:  SURGERY CENTER;  Service: General;  Laterality: Right;   CARPAL TUNNEL RELEASE Left    COLONOSCOPY     COLONOSCOPY W/ BIOPSIES AND POLYPECTOMY     Hx: of   CORONARY STENT INTERVENTION N/A 05/27/2017   Procedure: CORONARY STENT INTERVENTION;  Surgeon: Kathleene Hazel, MD;  Location: [facility] INVASIVE CV LAB;  Service: Cardiovascular;  Laterality: N/A;   ESOPHAGOGASTRODUODENOSCOPY     HNP     LEFT HEART CATH AND CORONARY ANGIOGRAPHY N/A 05/27/2017   Procedure: LEFT HEART CATH AND CORONARY  ANGIOGRAPHY;  Surgeon: Kathleene Hazel, MD;  Location: [facility] INVASIVE CV LAB;  Service: Cardiovascular;  Laterality: N/A;   LUMBAR LAMINECTOMY/DECOMPRESSION MICRODISCECTOMY Left 02/12/2013   Procedure: LUMBAR TWO THREE, LUMBAR THREE FOUR, LUMBAR FOUR FIVE  LAMINECTOMY/DECOMPRESSION MICRODISCECTOMY 3 LEVELS;  Surgeon: Temple Pacini, MD;  Location: [facility] NEURO ORS;  Service: Neurosurgery;  Laterality: Left;   NEPHRECTOMY Right 2010   10.rcc cancer   TOTAL KNEE ARTHROPLASTY Right    Redo   UPPER GASTROINTESTINAL ENDOSCOPY       Social History:  The patient  reports  that she quit smoking about 21 months ago. Her smoking use included cigarettes. She started smoking about 54 years ago. She has a 10.6 pack-year smoking history. She has never used smokeless tobacco. She reports that she does not drink alcohol and does not use drugs.   Family History:  The patient's family history includes Bipolar disorder in her daughter; Cancer in her brother; Dementia in her brother and brother; Diabetes in her brother and brother; Heart disease in her father; Kidney disease in her son; Parkinsonism in her brother; Prostate cancer in her father; Rheum arthritis in her mother; Stroke in her mother.    ROS:  Please see the history of present illness. All other systems are reviewed and  Negative to the above problem except as noted.    PHYSICAL EXAM: VS:  BP 112/60   Pulse 66   Ht 5\' 6"  (1.676 m)   Wt 229 lb (103.9 kg)   SpO2 94%   BMI 36.96 kg/m   ZOX:WRUEAVWU obese 77yo in  in no acute distress  HEENT: normal  Neck: JVP is not elevated  Cardiac: RRR; gr I/VI systolic murmur No LE  edema  Respiratory:  clear to auscultation  GI: soft, nontender      EKG:  EKG is not ordered today.   Lipid Panel    Component Value Date/Time   CHOL 143 09/11/2020 1227   TRIG 98 09/11/2020 1227   HDL 56 09/11/2020 1227   CHOLHDL 2.6 09/11/2020 1227   CHOLHDL 1.9 08/31/2020 0335   VLDL 20 08/31/2020 0335   LDLCALC  69 09/11/2020 1227   LDLCALC 98 03/26/2020 1041   LDLDIRECT 171.5 12/06/2011 0812      Wt Readings from Last 3 Encounters:  06/27/23 229 lb (103.9 kg)  06/15/23 229 lb (103.9 kg)  05/09/23 229 lb (103.9 kg)      ASSESSMENT AND PLAN:  1.  CAD. Last intervention back in 2018.to LCx   Last myoview in Jan 2022   No ischemia   No symptoms to sugg angina   Follow    2.  Hypertension.BP is well controlled   Keep on current regimen   3.  Hyperlipidemia.  Pt on Praluent, Zetia and Crstor   LDL 32  HDL 56  trgi 104      4  Palpitations   Pt denies   5  Metabolics  A1C 6.1 in Sept 2024      6  Musculoskel   Will refer to [employer]   Follow up in the  6 months    Current medicines are reviewed at length with the patient today.  The patient does not have concerns regarding medicines.  Signed, Dietrich Pates, MD  06/27/2023 11:27 AM    [facility] [address] [place], [place], [place]  [address] Phone: [phone]; Fax: [phone]

## 2023-06-27 ENCOUNTER — Encounter: Payer: Self-pay | Admitting: Internal Medicine

## 2023-06-27 ENCOUNTER — Ambulatory Visit: Payer: Medicare HMO | Attending: Internal Medicine | Admitting: Internal Medicine

## 2023-06-27 VITALS — BP 112/60 | HR 66 | Ht 66.0 in | Wt 229.0 lb

## 2023-06-27 DIAGNOSIS — M545 Low back pain, unspecified: Secondary | ICD-10-CM

## 2023-06-27 NOTE — Patient Instructions (Signed)
Medication Instructions:  No changes *If you need a refill on your cardiac medications before your next appointment, please call your pharmacy*   Lab Work: none If you have labs (blood work) drawn today and your tests are completely normal, you will receive your results only by: MyChart Message (if you have MyChart) OR A paper copy in the mail If you have any lab test that is abnormal or we need to change your treatment, we will call you to review the results.   Testing/Procedures: none   Follow-Up: At [facility], you and your health needs are our priority.  As part of our continuing mission to provide you with exceptional heart care, we have created designated Provider Care Teams.  These Care Teams include your primary Cardiologist (physician) and Advanced Practice Providers (APPs -  Physician Assistants and Nurse Practitioners) who all work together to provide you with the care you need, when you need it.   Your next appointment:   6 month(s)  Provider:   Dietrich Pates, MD     Other Instructions: You have been referred to Dr. Terrilee Files, at Sports Medicine for low back pain

## 2023-06-29 DIAGNOSIS — F331 Major depressive disorder, recurrent, moderate: Secondary | ICD-10-CM | POA: Diagnosis not present

## 2023-07-04 ENCOUNTER — Other Ambulatory Visit: Payer: Self-pay | Admitting: Family Medicine

## 2023-07-04 ENCOUNTER — Other Ambulatory Visit: Payer: Self-pay | Admitting: Diagnostic Neuroimaging

## 2023-07-08 ENCOUNTER — Encounter: Payer: Self-pay | Admitting: Family Medicine

## 2023-07-08 ENCOUNTER — Ambulatory Visit (INDEPENDENT_AMBULATORY_CARE_PROVIDER_SITE_OTHER): Payer: Medicare HMO | Admitting: Family Medicine

## 2023-07-08 VITALS — BP 120/70 | HR 67 | Temp 98.2°F | Resp 16 | Ht 66.0 in | Wt 226.4 lb

## 2023-07-08 DIAGNOSIS — D3502 Benign neoplasm of left adrenal gland: Secondary | ICD-10-CM

## 2023-07-08 DIAGNOSIS — I700 Atherosclerosis of aorta: Secondary | ICD-10-CM | POA: Insufficient documentation

## 2023-07-08 DIAGNOSIS — J302 Other seasonal allergic rhinitis: Secondary | ICD-10-CM | POA: Diagnosis not present

## 2023-07-08 DIAGNOSIS — H8110 Benign paroxysmal vertigo, unspecified ear: Secondary | ICD-10-CM

## 2023-07-08 NOTE — Progress Notes (Unsigned)
[name] D.[facility] [address] [place] [address] Phone: [phone]   Assessment and Plan:     There are no diagnoses linked to this encounter.  ***   Pertinent previous records reviewed include ***    Follow Up: ***     Subjective:   I, [name], am serving as a Neurosurgeon for Doctor Richardean Sale  Chief Complaint: low back and hip pain   HPI:   07/11/2023 Patient is a 77 year old female with low back and hip pain. Patient states   Relevant Historical Information: ***  Additional pertinent review of systems negative.   Current Outpatient Medications:    acetaminophen (TYLENOL) 500 MG tablet, Take 1,000 mg by mouth as needed for moderate pain or headache. , Disp: , Rfl:    aspirin 81 MG chewable tablet, Chew 81 mg by mouth daily., Disp: , Rfl:    Budeson-Glycopyrrol-Formoterol (BREZTRI AEROSPHERE) 160-9-4.8 MCG/ACT AERO, Inhale 2 puffs into the lungs in the morning and at bedtime., Disp: 2 each, Rfl: 11   chlorhexidine (PERIDEX) 0.12 % solution, as needed (dental work)., Disp: , Rfl:    Evolocumab (REPATHA SURECLICK) 140 MG/ML SOAJ, Inject 140 mg into the skin every 14 (fourteen) days., Disp: 2 mL, Rfl: 11   famotidine (PEPCID) 20 MG tablet, TAKE ONE TABLET BY MOUTH EVERY MORNING and TAKE ONE TABLET BY MOUTH EVERYDAY AT BEDTIME, Disp: 180 tablet, Rfl: 1   fluticasone (FLONASE) 50 MCG/ACT nasal spray, PLACE 2 SPRAYS INTO BOTH NOSTRILS AT BEDTIME AS NEEDED FOR ALLERGIES., Disp: 48 mL, Rfl: 1   gabapentin (NEURONTIN) 300 MG capsule, TAKE 1 CAPSULE BY MOUTH EVERY DAY AT BEDTIME, Disp: 30 capsule, Rfl: 0   hydrochlorothiazide (HYDRODIURIL) 25 MG tablet, TAKE 1 TABLET BY MOUTH ONCE DAILY, Disp: 90 tablet, Rfl: 2   ibuprofen (ADVIL) 600 MG tablet, Take 600 mg by mouth every 6 (six) hours as needed., Disp: , Rfl:    losartan (COZAAR) 100 MG tablet, TAKE 1 TABLET BY MOUTH EVERY MORNING, Disp: 90 tablet, Rfl: 2   meclizine  (ANTIVERT) 25 MG tablet, Take 1 tablet (25 mg total) by mouth 3 (three) times daily as needed for dizziness., Disp: 30 tablet, Rfl: 0   metoprolol succinate (TOPROL-XL) 100 MG 24 hr tablet, TAKE 1 TABLET BY MOUTH AT NOON, Disp: 90 tablet, Rfl: 2   montelukast (SINGULAIR) 10 MG tablet, TAKE ONE TABLET BY MOUTH EVERYDAY AT BEDTIME, Disp: 30 tablet, Rfl: 3   MYRBETRIQ 50 MG TB24 tablet, Take 50 mg by mouth daily., Disp: , Rfl:    nitroGLYCERIN (NITROSTAT) 0.4 MG SL tablet, Place 1 tablet (0.4 mg total) under the tongue every 5 (five) minutes as needed for chest pain., Disp: 25 tablet, Rfl: 2   ondansetron (ZOFRAN-ODT) 4 MG disintegrating tablet, 4mg  ODT q4 hours prn nausea/vomit, Disp: 20 tablet, Rfl: 0   pantoprazole (PROTONIX) 40 MG tablet, Take 1 tablet twice daily for 4 weeks, then reduce to once daily dosing., Disp: 120 tablet, Rfl: 3   rosuvastatin (CRESTOR) 10 MG tablet, Take 1 tablet (10 mg total) by mouth daily., Disp: 90 tablet, Rfl: 3   sertraline (ZOLOFT) 50 MG tablet, TAKE 1 TABLET BY MOUTH AT NOON, Disp: 90 tablet, Rfl: 3   triamcinolone cream (KENALOG) 0.1 %, Apply 1 Application topically 3 times/day as needed-between meals & bedtime., Disp: 30 g, Rfl: 1   Objective:     There were no vitals filed for this visit.  There is no height or weight on file to calculate BMI.    Physical Exam:    ***   Electronically signed by:  [name] D.[facility] 7:34 AM 07/08/23

## 2023-07-08 NOTE — Patient Instructions (Addendum)
A few things to remember from today's visit:  Benign paroxysmal positional vertigo, unspecified laterality  Atherosclerosis of aorta (HCC)  Seasonal allergic rhinitis, unspecified trigger Continue Flonase nasal spray once daily as needed and nasal saline irrigations as needed through the day.  If you need refills for medications you take chronically, please call your pharmacy. Do not use My Chart to request refills or for acute issues that need immediate attention. If you send a my chart message, it may take a few days to be addressed, specially if I am not in the office.  Please be sure medication list is accurate. If a new problem present, please set up appointment sooner than planned today.

## 2023-07-08 NOTE — Progress Notes (Signed)
ACUTE VISIT Chief Complaint  Patient presents with   Nasal Congestion   Dizziness   HPI: [name] is a 77 y.o. female with a PMHx significant for CAD, HTN, PAD, COPD, OSA, GERD, DM II, HLD polyneuropathy, CKD III, B12 deficiency, and vitamin D deficiency, among some, who is here today complaining of congestion and dizziness  Patient complains of nasal congestion, rhinorrhea, and intermittent dizziness for about a week. She describes the dizziness a spinning sensation when she gets up or moves around. Alleviated by being still for a few seconds. No associated symptoms. She was seen in urgent care in 01/2023 and given meclizine for dizziness.  Pertinent negatives include fever,sore throat, cough, SOB, or wheezing. Occasional cough but no more than usual. Occasional frontal pressure headache.  COPD: She quit smoking last year. She sees a pulmonologist annually, and uses a Breztri aerosphere 160-9-4.8 mcg/act inhaler.  Last chest CT 10/2020:Aortic Atherosclerosis (ICD10-I70.0) and Emphysema (ICD10-J43.9), also seen stable 3.0 cm left adrenal adenoma.   She is on Rosuvastatin 10 mg daily. Lab Results  Component Value Date   CHOL 143 09/11/2020   HDL 56 09/11/2020   LDLCALC 69 09/11/2020   LDLDIRECT 171.5 12/06/2011   TRIG 98 09/11/2020   CHOLHDL 2.6 09/11/2020   She saw audiology on 8/5, and was told she has mild hearing loss. It was recommended she follow annually.   She takes Singulair 10 mg for seasonal allergies.   Review of Systems  Constitutional:  Negative for activity change, appetite change and chills.  HENT:  Positive for postnasal drip. Negative for mouth sores, nosebleeds and sore throat.   Eyes:  Negative for discharge and redness.  Cardiovascular:  Negative for palpitations and leg swelling.  Gastrointestinal:  Negative for abdominal pain, nausea and vomiting.  Skin:  Negative for rash.  Allergic/Immunologic: Positive for environmental allergies.  Neurological:   Negative for syncope, facial asymmetry and weakness.  See other pertinent positives and negatives in HPI.  Current Outpatient Medications on File Prior to Visit  Medication Sig Dispense Refill   acetaminophen (TYLENOL) 500 MG tablet Take 1,000 mg by mouth as needed for moderate pain or headache.      aspirin 81 MG chewable tablet Chew 81 mg by mouth daily.     Budeson-Glycopyrrol-Formoterol (BREZTRI AEROSPHERE) 160-9-4.8 MCG/ACT AERO Inhale 2 puffs into the lungs in the morning and at bedtime. 2 each 11   chlorhexidine (PERIDEX) 0.12 % solution as needed (dental work).     Evolocumab (REPATHA SURECLICK) 140 MG/ML SOAJ Inject 140 mg into the skin every 14 (fourteen) days. 2 mL 11   famotidine (PEPCID) 20 MG tablet TAKE ONE TABLET BY MOUTH EVERY MORNING and TAKE ONE TABLET BY MOUTH EVERYDAY AT BEDTIME 180 tablet 1   fluticasone (FLONASE) 50 MCG/ACT nasal spray PLACE 2 SPRAYS INTO BOTH NOSTRILS AT BEDTIME AS NEEDED FOR ALLERGIES. 48 mL 1   gabapentin (NEURONTIN) 300 MG capsule TAKE 1 CAPSULE BY MOUTH EVERY DAY AT BEDTIME 30 capsule 0   hydrochlorothiazide (HYDRODIURIL) 25 MG tablet TAKE 1 TABLET BY MOUTH ONCE DAILY 90 tablet 2   ibuprofen (ADVIL) 600 MG tablet Take 600 mg by mouth every 6 (six) hours as needed.     losartan (COZAAR) 100 MG tablet TAKE 1 TABLET BY MOUTH EVERY MORNING 90 tablet 2   meclizine (ANTIVERT) 25 MG tablet Take 1 tablet (25 mg total) by mouth 3 (three) times daily as needed for dizziness. 30 tablet 0   metoprolol succinate (TOPROL-XL)  100 MG 24 hr tablet TAKE 1 TABLET BY MOUTH AT NOON 90 tablet 2   montelukast (SINGULAIR) 10 MG tablet TAKE ONE TABLET BY MOUTH EVERYDAY AT BEDTIME 30 tablet 3   MYRBETRIQ 50 MG TB24 tablet Take 50 mg by mouth daily.     nitroGLYCERIN (NITROSTAT) 0.4 MG SL tablet Place 1 tablet (0.4 mg total) under the tongue every 5 (five) minutes as needed for chest pain. 25 tablet 2   ondansetron (ZOFRAN-ODT) 4 MG disintegrating tablet 4mg  ODT q4 hours prn  nausea/vomit 20 tablet 0   pantoprazole (PROTONIX) 40 MG tablet Take 1 tablet twice daily for 4 weeks, then reduce to once daily dosing. 120 tablet 3   rosuvastatin (CRESTOR) 10 MG tablet Take 1 tablet (10 mg total) by mouth daily. 90 tablet 3   sertraline (ZOLOFT) 100 MG tablet Take 100 mg by mouth daily.     triamcinolone cream (KENALOG) 0.1 % Apply 1 Application topically 3 times/day as needed-between meals & bedtime. 30 g 1   No current facility-administered medications on file prior to visit.    Past Medical History:  Diagnosis Date   Allergy    Anxiety    C. difficile diarrhea    history of   CAD (coronary artery disease), native coronary artery    a. 05/2017 showed 100% mLCx tx with DES, otherwise 20% mRCA, 50% ramus, 30% dLAD, 20% prox-mid LAD, 20% mLAD treated medically.    Cancer [facility])    Right kidney CA removed.    CKD (chronic kidney disease) stage 3, GFR 30-59 ml/min (HCC) 10/11/2016   s/p R nephrectomy   Colon polyps 2008   HYPERPLASTIC   COPD (chronic obstructive pulmonary disease) (HCC)    former smoker   Depression    Gastritis    GERD (gastroesophageal reflux disease)    History of echocardiogram    Echo 3/18:  Moderate LVH, EF 60-65, grade 1 diastolic dysfunction, calcified aortic valve, mild MR, moderate LAE   History of nuclear stress test    Myoview 3/18: Mod size and intensity fixed septal defect, may be artifact.Opposite mod size and intensity lat defect, which is reversible and could represent ischemia or possibly artifact (SDS 4). LVEF 71% with normal wall motion. Intermediate risk study. >> images reviewed with Dr. Dietrich Pates - no sig ischemia; med rx    Hx of cardiovascular stress test    Lexiscan Myoview 6/16:  EF 70%, no scar or ischemia; Low Risk   Hyperlipidemia    Hypertension    Myocardial infarction (HCC)    2018   Neuropathy    Orthostatic hypotension 05/28/2017   OSA (obstructive sleep apnea) 01/16/2021   Osteoarthritis    Pre-diabetes     no meds   Rheumatoid arthritis (HCC)    RA (Dr. Dareen Piano) Bilateral hands   S/P angioplasty with stent 05/27/17 to LCX with DES  05/28/2017   Sleep apnea 2023   does not use CPAP   Thyroid disease    Allergies  Allergen Reactions   Oxycodone-Acetaminophen Hives and Itching    hallucinations   Percocet [Oxycodone-Acetaminophen] Hives, Itching and Other (See Comments)    hallucinations   Pravastatin Sodium Other (See Comments)    Muscle aches Muscle aches   Hydrocodone Other (See Comments)    "crazy dreams"   Aspirin Other (See Comments)    GI upset Other reaction(s): Other (See Comments) REACTION: GI upset    Social History   Socioeconomic History   Marital status: Divorced  Spouse name: Not on file   Number of children: 3   Years of education: 31   Highest education level: Not on file  Occupational History   Occupation: Retired    Associate Professor: RETIRED  Tobacco Use   Smoking status: Former    Current packs/day: 0.00    Average packs/day: 0.2 packs/day for 53.1 years (10.6 ttl pk-yrs)    Types: Cigarettes    Start date: 35    Quit date: 09/07/2021    Years since quitting: 1.8   Smokeless tobacco: Never   Tobacco comments:    pt has been an on and off smoker since age 48  Vaping Use   Vaping status: Never Used  Substance and Sexual Activity   Alcohol use: No    Alcohol/week: 0.0 standard drinks of alcohol   Drug use: No   Sexual activity: Not Currently    Birth control/protection: Surgical    Comment: hyst  Other Topics Concern   Not on file  Social History Narrative   Single, dgtr lives with her   Current on/off smoker   Alcohol use- no   Drug use-no   Regular Exercise-yes   Social Determinants of Health   Financial Resource Strain: Low Risk  (09/08/2022)   Overall Financial Resource Strain (CARDIA)    Difficulty of Paying Living Expenses: Not hard at all  Food Insecurity: No Food Insecurity (10/20/2022)   Hunger Vital Sign    Worried About Running  Out of Food in the Last Year: Never true    Ran Out of Food in the Last Year: Never true  Transportation Needs: No Transportation Needs (09/08/2022)   PRAPARE - Administrator, Civil Service (Medical): No    Lack of Transportation (Non-Medical): No  Physical Activity: Inactive (09/08/2022)   Exercise Vital Sign    Days of Exercise per Week: 0 days    Minutes of Exercise per Session: 0 min  Stress: Stress Concern Present (09/08/2022)   [employer] of Occupational Health - Occupational Stress Questionnaire    Feeling of Stress : To some extent  Social Connections: Moderately Integrated (09/08/2022)   Social Connection and Isolation Panel [NHANES]    Frequency of Communication with Friends and Family: More than three times a week    Frequency of Social Gatherings with Friends and Family: More than three times a week    Attends Religious Services: More than 4 times per year    Active Member of [employer] or Organizations: Yes    Attends Banker Meetings: More than 4 times per year    Marital Status: Divorced    Vitals:   07/08/23 1539  BP: 120/70  Pulse: 67  Resp: 16  Temp: 98.2 F (36.8 C)  SpO2: 91%   Body mass index is 36.54 kg/m.  Physical Exam Vitals and nursing note reviewed.  Constitutional:      General: She is not in acute distress.    Appearance: She is well-developed.  HENT:     Head: Normocephalic and atraumatic.     Right Ear: External ear normal. Tympanic membrane is not erythematous.     Left Ear: External ear normal. A middle ear effusion is present. Tympanic membrane is not erythematous.     Ears:     Comments: Right ear canal cerumen excess, TM seen partially.    Nose: Congestion present.     Right Turbinates: Enlarged.     Left Turbinates: Enlarged.     Comments: Mild maxillary  sinus pressure with palpation.    Mouth/Throat:     Mouth: Mucous membranes are moist.     Pharynx: Oropharynx is clear. Uvula midline.  Eyes:      Conjunctiva/sclera: Conjunctivae normal.  Cardiovascular:     Rate and Rhythm: Normal rate and regular rhythm.     Heart sounds: Murmur (Soft SEM RUSB) heard.  Pulmonary:     Effort: Pulmonary effort is normal. No respiratory distress.     Breath sounds: Normal breath sounds.  Abdominal:     Palpations: Abdomen is soft. There is no mass.     Tenderness: There is no abdominal tenderness.  Lymphadenopathy:     Cervical: No cervical adenopathy.  Skin:    General: Skin is warm.     Findings: No erythema or rash.  Neurological:     General: No focal deficit present.     Mental Status: She is alert and oriented to person, place, and time.     Cranial Nerves: No cranial nerve deficit.     Sensory: No sensory deficit.     Motor: No pronator drift.     Gait: Gait normal.     Deep Tendon Reflexes:     Reflex Scores:      Bicep reflexes are 2+ on the right side and 2+ on the left side.      Patellar reflexes are 2+ on the right side and 2+ on the left side. Psychiatric:        Mood and Affect: Mood and affect normal.    ASSESSMENT AND PLAN:  Ms. Krugman was seen today for congestion and dizzyness.   Benign paroxysmal positional vertigo, unspecified laterality Dizziness she is describing suggest positional vertigo, not reproducible here today. She received rx of Meclizine in the ED in 01/2023 for similar symptoms.We discussed some side effects, she can take it at night as needed. During ED visit she had a head CT, which showed no CT evidence for acute intracranial abnormality. 6 mm fat density mass at the right CP angle likely reflecting a small lipoma, no change compared to prior. Some of her medications and comorbilities can also cause dizziness. Fall precautions discussed. Instructed about warning signs.  Atherosclerosis of aorta (HCC) Assessment & Plan: Last LDL 32. Currently on Rosuvastatin 10 mg daily and Aspirin 81 mg daily.  Seasonal allergic rhinitis, unspecified trigger I do  not think there is an active infectious process. Continue Flonase nasal spray daily for 10-14 days then prn. Nasal saline irrigations as needed. Avoid decongestants or cold meds.  Currently on Singulair 10 mg daily, which she thinks is helping.  Adrenal adenoma, left Assessment & Plan: Last seen on chest CT in 10/2020 and stable.  Return if symptoms worsen or fail to improve.  I, Suanne Marker, acting as a scribe for Danyael Alipio [place], MD., have documented all relevant documentation on the behalf of Syncere Eble [place], MD, as directed by  Nazareth Kirk [place], MD while in the presence of Isela Stantz [place], MD.   I, Suanne Marker, have reviewed all documentation for this visit. The documentation on 07/08/23 for the exam, diagnosis, procedures, and orders are all accurate and complete.  Berenis Corter G. [place], MD  [facility]. [place] office.

## 2023-07-09 DIAGNOSIS — D3502 Benign neoplasm of left adrenal gland: Secondary | ICD-10-CM | POA: Insufficient documentation

## 2023-07-09 NOTE — Assessment & Plan Note (Signed)
Last LDL 32. Currently on Rosuvastatin 10 mg daily and Aspirin 81 mg daily.

## 2023-07-09 NOTE — Assessment & Plan Note (Signed)
Last seen on chest CT in 10/2020 and stable.

## 2023-07-11 ENCOUNTER — Ambulatory Visit: Payer: Medicare HMO | Admitting: Sports Medicine

## 2023-07-14 ENCOUNTER — Ambulatory Visit: Payer: Medicare HMO | Admitting: Psychology

## 2023-07-15 ENCOUNTER — Telehealth: Payer: Self-pay | Admitting: Family Medicine

## 2023-07-15 ENCOUNTER — Other Ambulatory Visit: Payer: Self-pay | Admitting: Gastroenterology

## 2023-07-15 DIAGNOSIS — K219 Gastro-esophageal reflux disease without esophagitis: Secondary | ICD-10-CM

## 2023-07-15 NOTE — Telephone Encounter (Signed)
Noted  

## 2023-07-15 NOTE — Telephone Encounter (Signed)
FYI Pt spoke with triage nurse and she will go to UC for diarrhea, dizziness

## 2023-07-18 ENCOUNTER — Other Ambulatory Visit: Payer: Self-pay

## 2023-07-18 DIAGNOSIS — K219 Gastro-esophageal reflux disease without esophagitis: Secondary | ICD-10-CM

## 2023-07-18 MED ORDER — PANTOPRAZOLE SODIUM 40 MG PO TBEC
40.0000 mg | DELAYED_RELEASE_TABLET | Freq: Every day | ORAL | 2 refills | Status: DC
Start: 2023-07-18 — End: 2023-09-05

## 2023-07-20 ENCOUNTER — Ambulatory Visit (INDEPENDENT_AMBULATORY_CARE_PROVIDER_SITE_OTHER): Payer: Medicare HMO | Admitting: Family Medicine

## 2023-07-20 ENCOUNTER — Encounter: Payer: Self-pay | Admitting: Family Medicine

## 2023-07-20 VITALS — BP 120/60 | HR 65 | Temp 97.7°F | Resp 16 | Ht 66.0 in | Wt 223.5 lb

## 2023-07-20 DIAGNOSIS — R5382 Chronic fatigue, unspecified: Secondary | ICD-10-CM | POA: Diagnosis not present

## 2023-07-20 DIAGNOSIS — R42 Dizziness and giddiness: Secondary | ICD-10-CM

## 2023-07-20 DIAGNOSIS — I119 Hypertensive heart disease without heart failure: Secondary | ICD-10-CM | POA: Diagnosis not present

## 2023-07-20 DIAGNOSIS — D1779 Benign lipomatous neoplasm of other sites: Secondary | ICD-10-CM

## 2023-07-20 NOTE — Progress Notes (Unsigned)
ACUTE VISIT Chief Complaint  Patient presents with   low energy   Dizziness   HPI: [name] is a 77 y.o. female with a PMHx significant for CAD, HTN, PAD, COPD,OSA, GERD, DM II, HLD, polyneuropathy, CKD III, B12 deficiency, and vitamin D deficiency, among some, who is here today complaining of dizziness and fatigue.   Dizziness:  Patient complains of intermittent episodes of dizziness/lightheadedness and feeling off balance when she is moving around.  She endorses some nausea associated with the dizziness. Hearing loss, stable  She doesn't have the dizziness while lying down in bed.  She mentions she had an instance of low blood pressure last weekend, she does not remember readings. She checks her BP about twice per week.  Last week, she says she had some nausea, vomiting, and diarrhea. She thinks GI symptoms were caused by a viral illness, vomiting and diarrhea resolved.  Pertinent negatives include spinning sensation, chest pain, difficulty breathing, palpitations, diaphoresis,fever, abnormal weight loss, or urinary symptoms. Denies headache, visual changes,or urinary symptoms. No new medications.  Trying to keep up with fluid intake.  She has not alleviated factors. Lightheadedness happens while moving around her house.  She has discussed this problem before in prior visits, and has followed with ENT.  She was seen in urgent care in 01/2023 for a similar problem and given meclizine, which helped slightly with her symptoms. She also received a Head CT, with the following impression: No CT evidence for acute intracranial abnormality.  6 mm fat density mass at the right CP angle likely reflecting a small lipoma, no change compared to prior.  She also follows with cardiology and neurology.  Fatigue:  Patient also complains of fatigue when she wakes up in the morning.  She recently got a CPAP machine, and has been sleeping for 7-8 hours per night.  She endorses some recent  stress from her son's medical issues.  Denies drowsiness while driving.   Review of Systems See other pertinent positives and negatives in HPI.  Current Outpatient Medications on File Prior to Visit  Medication Sig Dispense Refill   acetaminophen (TYLENOL) 500 MG tablet Take 1,000 mg by mouth as needed for moderate pain or headache.      aspirin 81 MG chewable tablet Chew 81 mg by mouth daily.     Budeson-Glycopyrrol-Formoterol (BREZTRI AEROSPHERE) 160-9-4.8 MCG/ACT AERO Inhale 2 puffs into the lungs in the morning and at bedtime. 2 each 11   chlorhexidine (PERIDEX) 0.12 % solution as needed (dental work).     Evolocumab (REPATHA SURECLICK) 140 MG/ML SOAJ Inject 140 mg into the skin every 14 (fourteen) days. 2 mL 11   famotidine (PEPCID) 20 MG tablet TAKE ONE TABLET BY MOUTH EVERY MORNING and TAKE ONE TABLET BY MOUTH EVERYDAY AT BEDTIME 180 tablet 1   fluticasone (FLONASE) 50 MCG/ACT nasal spray PLACE 2 SPRAYS INTO BOTH NOSTRILS AT BEDTIME AS NEEDED FOR ALLERGIES. 48 mL 1   gabapentin (NEURONTIN) 300 MG capsule TAKE 1 CAPSULE BY MOUTH EVERY DAY AT BEDTIME 30 capsule 0   hydrochlorothiazide (HYDRODIURIL) 25 MG tablet TAKE 1 TABLET BY MOUTH ONCE DAILY 90 tablet 2   ibuprofen (ADVIL) 600 MG tablet Take 600 mg by mouth every 6 (six) hours as needed.     losartan (COZAAR) 100 MG tablet TAKE 1 TABLET BY MOUTH EVERY MORNING 90 tablet 2   meclizine (ANTIVERT) 25 MG tablet Take 1 tablet (25 mg total) by mouth 3 (three) times daily as needed for dizziness.  30 tablet 0   metoprolol succinate (TOPROL-XL) 100 MG 24 hr tablet TAKE 1 TABLET BY MOUTH AT NOON 90 tablet 2   montelukast (SINGULAIR) 10 MG tablet TAKE ONE TABLET BY MOUTH EVERYDAY AT BEDTIME 30 tablet 3   MYRBETRIQ 50 MG TB24 tablet Take 50 mg by mouth daily.     nitroGLYCERIN (NITROSTAT) 0.4 MG SL tablet Place 1 tablet (0.4 mg total) under the tongue every 5 (five) minutes as needed for chest pain. 25 tablet 2   ondansetron (ZOFRAN-ODT) 4 MG  disintegrating tablet 4mg  ODT q4 hours prn nausea/vomit 20 tablet 0   pantoprazole (PROTONIX) 40 MG tablet Take 1 tablet (40 mg total) by mouth daily. \ 90 tablet 2   rosuvastatin (CRESTOR) 10 MG tablet Take 1 tablet (10 mg total) by mouth daily. 90 tablet 3   sertraline (ZOLOFT) 100 MG tablet Take 100 mg by mouth daily.     triamcinolone cream (KENALOG) 0.1 % Apply 1 Application topically 3 times/day as needed-between meals & bedtime. 30 g 1   No current facility-administered medications on file prior to visit.    Past Medical History:  Diagnosis Date   Allergy    Anxiety    C. difficile diarrhea    history of   CAD (coronary artery disease), native coronary artery    a. 05/2017 showed 100% mLCx tx with DES, otherwise 20% mRCA, 50% ramus, 30% dLAD, 20% prox-mid LAD, 20% mLAD treated medically.    Cancer [facility])    Right kidney CA removed.    CKD (chronic kidney disease) stage 3, GFR 30-59 ml/min (HCC) 10/11/2016   s/p R nephrectomy   Colon polyps 2008   HYPERPLASTIC   COPD (chronic obstructive pulmonary disease) (HCC)    former smoker   Depression    Gastritis    GERD (gastroesophageal reflux disease)    History of echocardiogram    Echo 3/18:  Moderate LVH, EF 60-65, grade 1 diastolic dysfunction, calcified aortic valve, mild MR, moderate LAE   History of nuclear stress test    Myoview 3/18: Mod size and intensity fixed septal defect, may be artifact.Opposite mod size and intensity lat defect, which is reversible and could represent ischemia or possibly artifact (SDS 4). LVEF 71% with normal wall motion. Intermediate risk study. >> images reviewed with Dr. Dietrich Pates - no sig ischemia; med rx    Hx of cardiovascular stress test    Lexiscan Myoview 6/16:  EF 70%, no scar or ischemia; Low Risk   Hyperlipidemia    Hypertension    Myocardial infarction (HCC)    2018   Neuropathy    Orthostatic hypotension 05/28/2017   OSA (obstructive sleep apnea) 01/16/2021   Osteoarthritis     Pre-diabetes    no meds   Rheumatoid arthritis (HCC)    RA (Dr. Dareen Piano) Bilateral hands   S/P angioplasty with stent 05/27/17 to LCX with DES  05/28/2017   Sleep apnea 2023   does not use CPAP   Thyroid disease    Allergies  Allergen Reactions   Oxycodone-Acetaminophen Hives and Itching    hallucinations   Percocet [Oxycodone-Acetaminophen] Hives, Itching and Other (See Comments)    hallucinations   Pravastatin Sodium Other (See Comments)    Muscle aches Muscle aches   Hydrocodone Other (See Comments)    "crazy dreams"   Aspirin Other (See Comments)    GI upset Other reaction(s): Other (See Comments) REACTION: GI upset    Social History   Socioeconomic History  Marital status: Divorced    Spouse name: Not on file   Number of children: 3   Years of education: 64   Highest education level: Not on file  Occupational History   Occupation: Retired    Associate Professor: RETIRED  Tobacco Use   Smoking status: Former    Current packs/day: 0.00    Average packs/day: 0.2 packs/day for 53.1 years (10.6 ttl pk-yrs)    Types: Cigarettes    Start date: 46    Quit date: 09/07/2021    Years since quitting: 1.8   Smokeless tobacco: Never   Tobacco comments:    pt has been an on and off smoker since age 59  Vaping Use   Vaping status: Never Used  Substance and Sexual Activity   Alcohol use: No    Alcohol/week: 0.0 standard drinks of alcohol   Drug use: No   Sexual activity: Not Currently    Birth control/protection: Surgical    Comment: hyst  Other Topics Concern   Not on file  Social History Narrative   Single, dgtr lives with her   Current on/off smoker   Alcohol use- no   Drug use-no   Regular Exercise-yes   Social Drivers of Health   Financial Resource Strain: Low Risk  (09/08/2022)   Overall Financial Resource Strain (CARDIA)    Difficulty of Paying Living Expenses: Not hard at all  Food Insecurity: No Food Insecurity (10/20/2022)   Hunger Vital Sign    Worried  About Running Out of Food in the Last Year: Never true    Ran Out of Food in the Last Year: Never true  Transportation Needs: No Transportation Needs (09/08/2022)   PRAPARE - Administrator, Civil Service (Medical): No    Lack of Transportation (Non-Medical): No  Physical Activity: Inactive (09/08/2022)   Exercise Vital Sign    Days of Exercise per Week: 0 days    Minutes of Exercise per Session: 0 min  Stress: Stress Concern Present (09/08/2022)   [employer] of Occupational Health - Occupational Stress Questionnaire    Feeling of Stress : To some extent  Social Connections: Moderately Integrated (09/08/2022)   Social Connection and Isolation Panel [NHANES]    Frequency of Communication with Friends and Family: More than three times a week    Frequency of Social Gatherings with Friends and Family: More than three times a week    Attends Religious Services: More than 4 times per year    Active Member of [employer] or Organizations: Yes    Attends Banker Meetings: More than 4 times per year    Marital Status: Divorced    Vitals:   07/20/23 1507  BP: 120/60  Pulse: 65  Resp: 16  Temp: 97.7 F (36.5 C)  SpO2: 98%   Body mass index is 36.07 kg/m.  Physical Exam Vitals and nursing note reviewed.  Constitutional:      General: She is not in acute distress.    Appearance: She is well-developed.  HENT:     Head: Normocephalic and atraumatic.     Right Ear: Tympanic membrane, ear canal and external ear normal.     Left Ear: Tympanic membrane, ear canal and external ear normal.     Mouth/Throat:     Mouth: Mucous membranes are moist.     Pharynx: Oropharynx is clear.  Eyes:     Conjunctiva/sclera: Conjunctivae normal.  Cardiovascular:     Rate and Rhythm: Normal rate and regular rhythm.  Heart sounds: No murmur heard. Pulmonary:     Effort: Pulmonary effort is normal. No respiratory distress.     Breath sounds: Normal breath sounds.  Abdominal:      Palpations: Abdomen is soft. There is no mass.     Tenderness: There is no abdominal tenderness.  Lymphadenopathy:     Cervical: No cervical adenopathy.  Skin:    General: Skin is warm.     Findings: No erythema or rash.  Neurological:     General: No focal deficit present.     Mental Status: She is alert and oriented to person, place, and time.     Cranial Nerves: No cranial nerve deficit.     Gait: Gait normal.     Comments: Dix-Hallpike maneuver negative.    Psychiatric:     Comments: Well groomed, good eye contact.     ASSESSMENT AND PLAN:  Ms. Groulx was seen today for dizziness and fatigue.   Lightheadedness  Brain lipoma  Hypertension with heart disease  Chronic fatigue    Return if symptoms worsen or fail to improve, for keep next appointment.  I, Rolla Etienne Wierda, acting as a scribe for Vallerie Hentz [place], MD., have documented all relevant documentation on the behalf of Rose Jaskowiak [place], MD, as directed by  Amarion Portell [place], MD while in the presence of Isaiah Torok [place], MD.   I, Ryan Ogborn [place], MD, have reviewed all documentation for this visit. The documentation on 07/20/23 for the exam, diagnosis, procedures, and orders are all accurate and complete.  Rose Detzel G. [place], MD  [facility]. [place] office.  Discharge Instructions   None

## 2023-07-20 NOTE — Patient Instructions (Addendum)
A few things to remember from today's visit:  Lightheadedness  Brain lipoma  Hypertension with heart disease  Chronic fatigue Monitor blood pressure for 2 weeks and let me know about readings. Arrange appt with neurologist.  If you need refills for medications you take chronically, please call your pharmacy. Do not use My Chart to request refills or for acute issues that need immediate attention. If you send a my chart message, it may take a few days to be addressed, specially if I am not in the office.  Please be sure medication list is accurate. If a new problem present, please set up appointment sooner than planned today.

## 2023-07-21 NOTE — Assessment & Plan Note (Signed)
BP today adequately controlled, reporting some low BP's at home. For now continue metoprolol succinate 100 mg daily, losartan 100 mg daily, and hydrochlorothiazide 25 mg daily as well as low salt diet. Instructed to monitor BP daily for 2 weeks and to let me know about readings.

## 2023-07-28 DIAGNOSIS — F331 Major depressive disorder, recurrent, moderate: Secondary | ICD-10-CM | POA: Diagnosis not present

## 2023-08-01 ENCOUNTER — Telehealth: Payer: Self-pay | Admitting: Diagnostic Neuroimaging

## 2023-08-01 NOTE — Telephone Encounter (Signed)
I reached the patient's voicemail. Left message requesting a return call to discuss symptoms.

## 2023-08-01 NOTE — Telephone Encounter (Signed)
Pt states her headaches have been worsening as well as her dizziness. Requesting call back to discuss

## 2023-08-02 ENCOUNTER — Other Ambulatory Visit: Payer: Self-pay | Admitting: Family Medicine

## 2023-08-02 ENCOUNTER — Other Ambulatory Visit: Payer: Self-pay | Admitting: Diagnostic Neuroimaging

## 2023-08-11 ENCOUNTER — Ambulatory Visit: Payer: Medicare HMO | Admitting: Psychology

## 2023-08-11 DIAGNOSIS — F331 Major depressive disorder, recurrent, moderate: Secondary | ICD-10-CM

## 2023-08-11 NOTE — Progress Notes (Signed)
 [facility] Counselor/Therapist Progress Note  Patient ID: [name], MRN: [id number],    Date: 08/11/2023  Time Spent: 4:32pm-5:35pm  pt is seen for an in person visit.     Treatment Type: Individual Therapy  Reported Symptoms: pt reports some depressed moods, worries for son's health, days of loss of interest and withdrawn.   Mental Status Exam: Appearance:  Well Groomed     Behavior: Appropriate  Motor: Normal  Speech/Language:  Normal Rate  Affect: Appropriate  Mood: depressed  Thought process: normal  Thought content:   WNL  Sensory/Perceptual disturbances:   WNL  Orientation: oriented to person, place, time/date, and situation  Attention: Good  Concentration: Good  Memory: WNL  Fund of knowledge:  Good  Insight:   Good  Judgment:  Good  Impulse Control: Good   Risk Assessment: Danger to Self:  No Self-injurious Behavior: No Danger to Others: No Duty to Warn:no Physical Aggression / Violence:No  Access to Firearms a concern: No  Gang Involvement:No   Subjective: Counselor assessed pt current functioning per pt report. Processed w/pt mood and engagement.  Discussed recent stressors and worries w/ son's health.  Encouraged pt connection w/ friends and family and reconnecting w/ community.    Pt affect wnl.  Pt reported some days of down mood.  Pt reports that he son has had couple more fingers amputated and has been to hospital couple times.  Pt is helping to drive to [place] 3 times a week.  Pt reports some days she gets home and doesn't want to engage and talk w/ others.  Pt had made goal for self to engage and return to in person church.  Pt reports she has started w/ NP [facility] at [facility] and started on lexapro 10 mg last week.    Interventions: Cognitive Behavioral Therapy and behavioral activation  Diagnosis:Major depressive disorder, recurrent episode, moderate (HCC)  Plan: pt to f/u in 3-4 weeks for counseling.  Pt to f/u as scheduled w/  PCP.  Pt reports has a referral to psychiatrist for medication eval Nov 2024.     Individualized Treatment Plan Strengths: enjoys time w/ daughter, son and w/ extended family and church community  Supports: her daughter   Goal/Needs for Treatment:  In order of importance to patient 1) engaged in activities/ talk w/ friends 2) decreased depressed moods 3) ---   Client Statement of Needs: Get out of this mood I'm in.    Treatment Level:out pt counseling  Symptoms:depressed mood, tearfulness, loss of interest, difficulty w/ motivation, withdrawn from interacting.   Client Treatment Preferences:biweekly in person counseling.  Continue medication management w/ PCP.    Healthcare consumer's goal for treatment:  Counselor, [name], [facility] will support the patient's ability to achieve the goals identified. Cognitive Behavioral Therapy, Assertive Communication/Conflict Resolution Training, Relaxation Training, ACT, Humanistic and other evidenced-based practices will be used to promote progress towards healthy functioning.   Healthcare consumer will: Actively participate in therapy, working towards healthy functioning.    *Justification for Continuation/Discontinuation of Goal: R=Revised, O=Ongoing, A=Achieved, D=Discontinued  Goal 1)  Increased engagement w/ activities she enjoys and w/ social interactions AEB pt report and counselor observation. Baseline date 12/02/22: Progress towards goal 0; How Often - Daily Target Date Goal Was reviewed Status Code Progress towards goal/Likert rating  12/02/23                Goal 2) Increased coping w/ losses and w/ depressed moods AEB utilizing coping skills daily.  Baseline date 12/02/22: Progress towards goal 0; How Often - Daily Target Date Goal Was reviewed Status Code Progress towards goal  12/02/23                  This plan has been reviewed and created by the following participants:  This plan will be reviewed at least every 12  months. Date Behavioral Health Clinician Date Guardian/Patient   12/02/22  [facility] Rose [facility] 12/02/22 Verbal Consent Provided                             [name] LCMHC

## 2023-09-02 ENCOUNTER — Other Ambulatory Visit: Payer: Self-pay | Admitting: Gastroenterology

## 2023-09-02 DIAGNOSIS — K219 Gastro-esophageal reflux disease without esophagitis: Secondary | ICD-10-CM

## 2023-09-05 DIAGNOSIS — F331 Major depressive disorder, recurrent, moderate: Secondary | ICD-10-CM | POA: Diagnosis not present

## 2023-09-08 ENCOUNTER — Ambulatory Visit: Payer: Medicare HMO | Admitting: Psychology

## 2023-09-09 ENCOUNTER — Telehealth: Payer: Self-pay | Admitting: Family Medicine

## 2023-09-09 ENCOUNTER — Other Ambulatory Visit: Payer: Self-pay

## 2023-09-09 ENCOUNTER — Emergency Department (HOSPITAL_BASED_OUTPATIENT_CLINIC_OR_DEPARTMENT_OTHER)
Admission: EM | Admit: 2023-09-09 | Discharge: 2023-09-10 | Disposition: A | Discharge status: Home / Self Care | Payer: Medicare HMO | Attending: Emergency Medicine | Admitting: Emergency Medicine

## 2023-09-09 DIAGNOSIS — Z87891 Personal history of nicotine dependence: Secondary | ICD-10-CM | POA: Insufficient documentation

## 2023-09-09 DIAGNOSIS — J069 Acute upper respiratory infection, unspecified: Secondary | ICD-10-CM | POA: Diagnosis not present

## 2023-09-09 DIAGNOSIS — R5381 Other malaise: Secondary | ICD-10-CM

## 2023-09-09 DIAGNOSIS — Z20822 Contact with and (suspected) exposure to covid-19: Secondary | ICD-10-CM | POA: Insufficient documentation

## 2023-09-09 DIAGNOSIS — R42 Dizziness and giddiness: Secondary | ICD-10-CM | POA: Insufficient documentation

## 2023-09-09 DIAGNOSIS — R059 Cough, unspecified: Secondary | ICD-10-CM | POA: Diagnosis not present

## 2023-09-09 DIAGNOSIS — I129 Hypertensive chronic kidney disease with stage 1 through stage 4 chronic kidney disease, or unspecified chronic kidney disease: Secondary | ICD-10-CM | POA: Insufficient documentation

## 2023-09-09 DIAGNOSIS — I2510 Atherosclerotic heart disease of native coronary artery without angina pectoris: Secondary | ICD-10-CM | POA: Insufficient documentation

## 2023-09-09 DIAGNOSIS — J449 Chronic obstructive pulmonary disease, unspecified: Secondary | ICD-10-CM | POA: Diagnosis not present

## 2023-09-09 DIAGNOSIS — N189 Chronic kidney disease, unspecified: Secondary | ICD-10-CM | POA: Diagnosis not present

## 2023-09-09 LAB — RESP PANEL BY RT-PCR (RSV, FLU A&B, COVID)  RVPGX2
Influenza A by PCR: NEGATIVE
Influenza B by PCR: NEGATIVE
Resp Syncytial Virus by PCR: NEGATIVE
SARS Coronavirus 2 by RT PCR: NEGATIVE

## 2023-09-09 NOTE — Telephone Encounter (Signed)
 Copied from CRM [id number]. Topic: General - Call Back - No Documentation >> Sep 09, 2023 11:06 AM Chuck Crater wrote: Reason for CRM: Patient is returning a call from someone in the clinic.

## 2023-09-09 NOTE — Telephone Encounter (Signed)
 Patient wasn't called by clinical team.

## 2023-09-09 NOTE — ED Triage Notes (Signed)
 Pt POV reporting headache and URI sx past fews days. Denies nvd.

## 2023-09-10 MED ORDER — MECLIZINE HCL 25 MG PO TABS: 25.0000 mg | ORAL_TABLET | Freq: Three times a day (TID) | ORAL | 0 refills | Status: DC | PRN

## 2023-09-10 NOTE — ED Provider Notes (Signed)
 [facility]GENC[facility] Emergency Department Provider Note MRN:  [id number]  Arrival date & time: 09/10/23     Chief Complaint   URI   History of Present Illness   [name] is a 78 y.o. year-old female with a history of CAD, CKD presenting to the ED with chief complaint of URI.  Cough and nasal congestion for the past few days, denies sore throat, no chest pain or shortness of breath, no abdominal pain.  Occasionally feeling dizzy with certain head movements.  Described as a lightheadedness.  Currently not dizzy.  Review of Systems  A thorough review of systems was obtained and all systems are negative except as noted in the HPI and PMH.   Patient's Health History    Past Medical History:  Diagnosis Date   Allergy     Anxiety    C. difficile diarrhea    history of   CAD (coronary artery disease), native coronary artery    a. 05/2017 showed 100% mLCx tx with DES, otherwise 20% mRCA, 50% ramus, 30% dLAD, 20% prox-mid LAD, 20% mLAD treated medically.    Cancer [facility])    Right kidney CA removed.    CKD (chronic kidney disease) stage 3, GFR 30-59 ml/min (HCC) 10/11/2016   s/p R nephrectomy   Colon polyps 2008   HYPERPLASTIC   COPD (chronic obstructive pulmonary disease) (HCC)    former smoker   Depression    Gastritis    GERD (gastroesophageal reflux disease)    History of echocardiogram    Echo 3/18:  Moderate LVH, EF 60-65, grade 1 diastolic dysfunction, calcified aortic valve, mild MR, moderate LAE   History of nuclear stress test    Myoview  3/18: Mod size and intensity fixed septal defect, may be artifact.Opposite mod size and intensity lat defect, which is reversible and could represent ischemia or possibly artifact (SDS 4). LVEF 71% with normal wall motion. Intermediate risk study. >> images reviewed with Dr. Vina Gull - no sig ischemia; med rx    Hx of cardiovascular stress test    Lexiscan  Myoview  6/16:  EF 70%, no scar or ischemia; Low Risk    Hyperlipidemia    Hypertension    Myocardial infarction (HCC)    2018   Neuropathy    Orthostatic hypotension 05/28/2017   OSA (obstructive sleep apnea) 01/16/2021   Osteoarthritis    Pre-diabetes    no meds   Rheumatoid arthritis (HCC)    RA (Dr. Lenon) Bilateral hands   S/P angioplasty with stent 05/27/17 to LCX with DES  05/28/2017   Sleep apnea 2023   does not use CPAP   Thyroid  disease     Past Surgical History:  Procedure Laterality Date   ABDOMINAL HYSTERECTOMY  1978   ANTERIOR CERVICAL DECOMP/DISCECTOMY FUSION N/A 05/29/2018   Procedure: ANTERIOR CERVICAL DECOMPRESSION FUSION - CERVICAL FIVE-CERVICAL SIX - CERVICAL SIX-CERVICAL SEVEN;  Surgeon: Louis Shove, MD;  Location: [facility] OR;  Service: Neurosurgery;  Laterality: N/A;   BACK SURGERY     x 2   BREAST LUMPECTOMY WITH RADIOACTIVE SEED LOCALIZATION Right 04/13/2022   Procedure: RIGHT BREAST SEED LUMPECTOMY;  Surgeon: Vanderbilt Ned, MD;  Location: [facility];  Service: General;  Laterality: Right;   CARPAL TUNNEL RELEASE Left    COLONOSCOPY     COLONOSCOPY W/ BIOPSIES AND POLYPECTOMY     Hx: of   CORONARY STENT INTERVENTION N/A 05/27/2017   Procedure: CORONARY STENT INTERVENTION;  Surgeon: Verlin Lonni BIRCH, MD;  Location: [facility]  INVASIVE CV LAB;  Service: Cardiovascular;  Laterality: N/A;   ESOPHAGOGASTRODUODENOSCOPY     HNP     LEFT HEART CATH AND CORONARY ANGIOGRAPHY N/A 05/27/2017   Procedure: LEFT HEART CATH AND CORONARY ANGIOGRAPHY;  Surgeon: Verlin Lonni BIRCH, MD;  Location: [facility] INVASIVE CV LAB;  Service: Cardiovascular;  Laterality: N/A;   LUMBAR LAMINECTOMY/DECOMPRESSION MICRODISCECTOMY Left 02/12/2013   Procedure: LUMBAR TWO THREE, LUMBAR THREE FOUR, LUMBAR FOUR FIVE  LAMINECTOMY/DECOMPRESSION MICRODISCECTOMY 3 LEVELS;  Surgeon: Victory DELENA Gunnels, MD;  Location: [facility] NEURO ORS;  Service: Neurosurgery;  Laterality: Left;   NEPHRECTOMY Right 2010   10.rcc cancer   TOTAL KNEE ARTHROPLASTY Right     Redo   UPPER GASTROINTESTINAL ENDOSCOPY      Family History  Problem Relation Age of Onset   Rheum arthritis Mother    Stroke Mother    Prostate cancer Father    Heart disease Father    Diabetes Brother    Dementia Brother    Parkinsonism Brother    Diabetes Brother    Cancer Brother    Dementia Brother    Kidney disease Son    Bipolar disorder Daughter    Colon cancer Neg Hx    Esophageal cancer Neg Hx    Pancreatic cancer Neg Hx    Liver disease Neg Hx    Rectal cancer Neg Hx    Stomach cancer Neg Hx     Social History   Socioeconomic History   Marital status: Divorced    Spouse name: Not on file   Number of children: 3   Years of education: 12   Highest education level: Not on file  Occupational History   Occupation: Retired    Associate Professor: RETIRED  Tobacco Use   Smoking status: Former    Current packs/day: 0.00    Average packs/day: 0.2 packs/day for 53.1 years (10.6 ttl pk-yrs)    Types: Cigarettes    Start date: 50    Quit date: 09/07/2021    Years since quitting: 2.0   Smokeless tobacco: Never   Tobacco comments:    pt has been an on and off smoker since age 63  Vaping Use   Vaping status: Never Used  Substance and Sexual Activity   Alcohol use: No    Alcohol/week: 0.0 standard drinks of alcohol   Drug use: No   Sexual activity: Not Currently    Birth control/protection: Surgical    Comment: hyst  Other Topics Concern   Not on file  Social History Narrative   Single, dgtr lives with her   Current on/off smoker   Alcohol use- no   Drug use-no   Regular Exercise-yes   Social Drivers of Health   Financial Resource Strain: Low Risk  (09/08/2022)   Overall Financial Resource Strain (CARDIA)    Difficulty of Paying Living Expenses: Not hard at all  Food Insecurity: No Food Insecurity (10/20/2022)   Hunger Vital Sign    Worried About Running Out of Food in the Last Year: Never true    Ran Out of Food in the Last Year: Never true  Transportation  Needs: No Transportation Needs (09/08/2022)   PRAPARE - Administrator, Civil Service (Medical): No    Lack of Transportation (Non-Medical): No  Physical Activity: Inactive (09/08/2022)   Exercise Vital Sign    Days of Exercise per Week: 0 days    Minutes of Exercise per Session: 0 min  Stress: Stress Concern Present (09/08/2022)   [place]  [employer]ational [employer] - Occupational Stress Questionnaire    Feeling of Stress : To some extent  Social Connections: Moderately Integrated (09/08/2022)   Social Connection and Isolation Panel [NHANES]    Frequency of Communication with Friends and Family: More than three times a week    Frequency of Social Gatherings with Friends and Family: More than three times a week    Attends Religious Services: More than 4 times per year    Active Member of [employer] or Organizations: Yes    Attends Engineer, Structural: More than 4 times per year    Marital Status: Divorced  Intimate Partner Violence: Not At Risk (09/08/2022)   Humiliation, Afraid, Rape, and Kick questionnaire    Fear of Current or Ex-Partner: No    Emotionally Abused: No    Physically Abused: No    Sexually Abused: No     Physical Exam   Vitals:   09/09/23 2008 09/09/23 2345  BP: 128/74 113/67  Pulse: 62 60  Resp: 18 18  Temp: 98.3 F (36.8 C) 98.3 F (36.8 C)  SpO2: 100% 96%    CONSTITUTIONAL: Well-appearing, NAD NEURO/PSYCH:  Alert and oriented x 3, no focal deficits EYES:  eyes equal and reactive ENT/NECK:  no LAD, no JVD CARDIO: Regular rate, well-perfused, normal S1 and S2 PULM:  CTAB no wheezing or rhonchi GI/GU:  non-distended, non-tender MSK/SPINE:  No gross deformities, no edema SKIN:  no rash, atraumatic   *Additional and/or pertinent findings included in MDM below  Diagnostic and Interventional Summary    EKG Interpretation Date/Time:    Ventricular Rate:    PR Interval:    QRS Duration:    QT Interval:    QTC Calculation:   R Axis:       Text Interpretation:         Labs Reviewed  RESP PANEL BY RT-PCR (RSV, FLU A&B, COVID)  [id number]    No orders to display    Medications - No data to display   Procedures  /  Critical Care Procedures  ED Course and Medical Decision Making  Initial Impression and Ddx Suspect viral illness, also may be describing peripheral vertigo.  She has completely normal neurological exam at this time, normal and symmetric strength and sensation, normal coordination, normal speech.  Not currently dizzy.  Highly doubt central cause of vertigo.  Possibly a viral vestibular neuritis.  Past medical/surgical history that increases complexity of ED encounter: CAD, CKD  Interpretation of Diagnostics COVID flu and RSV negative  Patient Reassessment and Ultimate Disposition/Management     With reassuring exam, vital signs, nothing to suggest emergent process at this time, appropriate for discharge.  Patient management required discussion with the following services or consulting groups:  None  Complexity of Problems Addressed Acute complicated illness or Injury  Additional Data Reviewed and Analyzed Further history obtained from: Further history from spouse/family member  Additional Factors Impacting ED Encounter Risk Prescriptions  Ozell HERO. Theadore, MD [facility] Emergency Medicine [facility] [email or web address]  Final Clinical Impressions(s) / ED Diagnoses     ICD-10-CM   1. Malaise  R53.81     2. Upper respiratory tract infection, unspecified type  J06.9     3. Dizziness  R42       ED Discharge Orders          Ordered    meclizine  (ANTIVERT ) 25 MG tablet  3 times daily PRN        09/10/23 0022  Discharge Instructions Discussed with and Provided to Patient:    Discharge Instructions      You were evaluated in the Emergency Department and after careful evaluation, we did not find any emergent condition requiring admission or further  testing in the hospital.  Your exam/testing today is overall reassuring.  Symptoms may be due to a viral illness.  May also be experiencing some vertigo.  Recommend Tylenol  or Motrin  for discomfort, can use the meclizine  medication for your dizziness.  Recommend follow-up with your regular doctors.  Please return to the Emergency Department if you experience any worsening of your condition.   Thank you for allowing us  to be a part of your care.      Theadore Ozell HERO, MD 09/10/23 [phone]

## 2023-09-10 NOTE — Discharge Instructions (Signed)
 You were evaluated in the Emergency Department and after careful evaluation, we did not find any emergent condition requiring admission or further testing in the hospital.  Your exam/testing today is overall reassuring.  Symptoms may be due to a viral illness.  May also be experiencing some vertigo.  Recommend Tylenol  or Motrin  for discomfort, can use the meclizine  medication for your dizziness.  Recommend follow-up with your regular doctors.  Please return to the Emergency Department if you experience any worsening of your condition.   Thank you for allowing us  to be a part of your care.

## 2023-09-12 ENCOUNTER — Telehealth: Payer: Self-pay

## 2023-09-12 NOTE — Transitions of Care (Post Inpatient/ED Visit) (Signed)
   09/12/2023  Name: [name] MRN: [id number] DOB: Dec 30, 1945  Today's TOC FU Call Status: Today's TOC FU Call Status:: Unsuccessful Call (1st Attempt) Unsuccessful Call (1st Attempt) Date: 09/12/23  Attempted to reach the patient regarding the most recent Inpatient/ED visit.  Follow Up Plan: Additional outreach attempts will be made to reach the patient to complete the Transitions of Care (Post Inpatient/ED visit) call.   Signature Karena Addison, LPN [facility] Nurse Health Advisor Direct Dial [phone]

## 2023-09-15 NOTE — Transitions of Care (Post Inpatient/ED Visit) (Signed)
   09/15/2023  Name: [name] MRN: [id number] DOB: [birth year]  Today's TOC FU Call Status: Today's TOC FU Call Status:: Unsuccessful Call (2nd Attempt) Unsuccessful Call (1st Attempt) Date: 09/12/23 Unsuccessful Call (2nd Attempt) Date: 09/15/23  Attempted to reach the patient regarding the most recent Inpatient/ED visit.  Follow Up Plan: Additional outreach attempts will be made to reach the patient to complete the Transitions of Care (Post Inpatient/ED visit) call.   Signature Karena Addison, LPN [facility] Nurse Health Advisor Direct Dial [phone]

## 2023-09-19 ENCOUNTER — Telehealth: Payer: Self-pay | Admitting: Family Medicine

## 2023-09-19 NOTE — Telephone Encounter (Signed)
No openings in march

## 2023-09-19 NOTE — Transitions of Care (Post Inpatient/ED Visit) (Signed)
   09/19/2023  Name: [name] MRN: [id number] DOB: November 07, 1945  Today's TOC FU Call Status: Today's TOC FU Call Status:: Unsuccessful Call (3rd Attempt) Unsuccessful Call (1st Attempt) Date: 09/12/23 Unsuccessful Call (2nd Attempt) Date: 09/15/23 Unsuccessful Call (3rd Attempt) Date: 09/19/23  Attempted to reach the patient regarding the most recent Inpatient/ED visit.  Follow Up Plan: No further outreach attempts will be made at this time. We have been unable to contact the patient.  Signature Karena Addison, LPN [facility] Nurse Health Advisor Direct Dial [phone]

## 2023-09-19 NOTE — Telephone Encounter (Signed)
Copied from CRM [id number]. Topic: Appointments - Scheduling Inquiry for Clinic >> Sep 19, 2023  9:37 AM [name] wrote: Reason for CRM: Patient has to reschedule her AWV on 2/17 as her son is having surgery. Patient was rescheduled for June as that was the soonest opening, please call patient back if she can fit in to the March schedule per her request.

## 2023-10-03 ENCOUNTER — Other Ambulatory Visit: Payer: Self-pay | Admitting: Diagnostic Neuroimaging

## 2023-10-20 ENCOUNTER — Ambulatory Visit (INDEPENDENT_AMBULATORY_CARE_PROVIDER_SITE_OTHER): Payer: Medicare HMO | Admitting: Psychology

## 2023-10-20 DIAGNOSIS — F331 Major depressive disorder, recurrent, moderate: Secondary | ICD-10-CM | POA: Diagnosis not present

## 2023-10-20 NOTE — Progress Notes (Signed)
 [facility] Counselor/Therapist Progress Note  Patient ID: [name], MRN: [id number],    Date: 10/20/2023  Time Spent: 1:35pm-2:35pm  pt is seen for an in person visit.     Treatment Type: Individual Therapy  Reported Symptoms: pt reports sadness and stress w/ son's recent illness and death  Mental Status Exam: Appearance:  Well Groomed     Behavior: Appropriate  Motor: Normal  Speech/Language:  Normal Rate  Affect: Appropriate  Mood: sad  Thought process: normal  Thought content:   WNL  Sensory/Perceptual disturbances:   WNL  Orientation: oriented to person, place, time/date, and situation  Attention: Good  Concentration: Good  Memory: WNL  Fund of knowledge:  Good  Insight:   Good  Judgment:  Good  Impulse Control: Good   Risk Assessment: Danger to Self:  No Self-injurious Behavior: No Danger to Others: No Duty to Warn:no Physical Aggression / Violence:No  Access to Firearms a concern: No  Gang Involvement:No   Subjective: Counselor assessed pt current functioning per pt report. Processed w/pt stress w/ son's illness and death.  Discussed grief and not right or wrong way to grieve.  Support to pt w/ stories shared. Explored w/ pt her supports.   Pt affect congruent w/ report of sadness.  Pt reports her son died yesterday morning in hospice after being in the hospital for month.  Pt shared story of his illness over past several weeks and his transition to hospice and time w/ him.  Pt aware that she hasn't been overly emotional or tearful at this time and that was same when her mother died.  Pt acknowledged supports and feeling comforted that he had good care and was able to died peacefully.  Pt discussed her supports and shared how well loved her son was and the expression of this from those she knew and didn't.  Pt reports his celebration of life will be on 11/12/23.     Interventions:  supportive  Diagnosis:Major depressive disorder, recurrent  episode, moderate (HCC)  Plan: pt to f/u in 3-4 weeks for counseling.  Pt to f/u as scheduled w/ PCP and psychiatrist as scheduled.    Individualized Treatment Plan Strengths: enjoys time w/ daughter, son and w/ extended family and church community  Supports: her daughter   Goal/Needs for Treatment:  In order of importance to patient 1) engaged in activities/ talk w/ friends 2) decreased depressed moods 3) ---   Client Statement of Needs: "Get out of this mood I'm in. "   Treatment Level:out pt counseling  Symptoms:depressed mood, tearfulness, loss of interest, difficulty w/ motivation, withdrawn from interacting.   Client Treatment Preferences:biweekly in person counseling.  Continue medication management w/ PCP.    Healthcare consumer's goal for treatment:  Counselor, Forde Radon, [facility] will support the patient's ability to achieve the goals identified. Cognitive Behavioral Therapy, Assertive Communication/Conflict Resolution Training, Relaxation Training, ACT, Humanistic and other evidenced-based practices will be used to promote progress towards healthy functioning.   Healthcare consumer will: Actively participate in therapy, working towards healthy functioning.    *Justification for Continuation/Discontinuation of Goal: R=Revised, O=Ongoing, A=Achieved, D=Discontinued  Goal 1)  Increased engagement w/ activities she enjoys and w/ social interactions AEB pt report and counselor observation. Baseline date 12/02/22: Progress towards goal 0; How Often - Daily Target Date Goal Was reviewed Status Code Progress towards goal/Likert rating  12/02/23  Goal 2) Increased coping w/ losses and w/ depressed moods AEB utilizing coping skills daily.   Baseline date 12/02/22: Progress towards goal 0; How Often - Daily Target Date Goal Was reviewed Status Code Progress towards goal  12/02/23                  This plan has been reviewed and created by the following  participants:  This plan will be reviewed at least every 12 months. Date Behavioral Health Clinician Date Guardian/Patient   12/02/22  [facility] Ophelia [facility] 12/02/22 Verbal Consent Provided                      Forde Radon [facility]

## 2023-10-24 DIAGNOSIS — F331 Major depressive disorder, recurrent, moderate: Secondary | ICD-10-CM | POA: Diagnosis not present

## 2023-10-25 ENCOUNTER — Ambulatory Visit: Payer: Medicare HMO | Admitting: Dermatology

## 2023-11-01 ENCOUNTER — Telehealth: Payer: Self-pay

## 2023-11-01 NOTE — Telephone Encounter (Signed)
 Copied from CRM [id number]. Topic: Clinical - Prescription Issue >> Nov 01, 2023 11:05 AM [name] wrote: Reason for CRM: Patient would like to confirm if the company for Budeson-Glycopyrrol-Formoterol (BREZTRI AEROSPHERE) 160-9-4.8 MCG/ACT AERO has sent paperwork in regard to renewing her prescription.  ATC x1 LVM for patient to call our office back .  Amy can you please check on this .  Thank you

## 2023-11-02 DIAGNOSIS — M1712 Unilateral primary osteoarthritis, left knee: Secondary | ICD-10-CM | POA: Diagnosis not present

## 2023-11-02 MED ORDER — BREZTRI AEROSPHERE 160-9-4.8 MCG/ACT IN AERO: 2.0000 | INHALATION_SPRAY | Freq: Two times a day (BID) | RESPIRATORY_TRACT | Status: DC

## 2023-11-02 NOTE — Telephone Encounter (Addendum)
 Called patient.  Could not locate any forms from [place] Patient [place] Program for [place].  Printed out a new PAP form.  Called patient.  She will come by clinic and pick up forms and fill out and send back with all required documentation needed.  Will get Dr. Maple Hudson to sign provider section once we fill out and will fax entire forms once all completed.  Patient asking for two samples of Breztri.  Okay per Dr. Maple Hudson to give patient 2 samples of Beztri.  Left samples at front desk with PAP [place] forms.

## 2023-11-10 ENCOUNTER — Ambulatory Visit: Admitting: Psychology

## 2023-11-14 ENCOUNTER — Ambulatory Visit: Payer: Self-pay | Admitting: *Deleted

## 2023-11-14 NOTE — Telephone Encounter (Signed)
  Chief Complaint: dizziness, nasal congestion, dry cough Symptoms: see above. Reports "feels terrible". Can walk . Reports feeling "hot" at times. Did not report fever Frequency: couple of days  Pertinent Negatives: Patient denies chest pain no difficulty breathing no fever reported  Disposition: [] ED /[] Urgent Care (no appt availability in office) / [x] Appointment(In office/virtual)/ []  [place] [facility]/ [] Home Care/ [] Refused Recommended Disposition /[] [place] [facility]/ []  Follow-up with PCP Additional Notes:   No available appt with PCP . Scheduled appt with other provider earliest 11/16/23. Please advise if earlier appt available .  Recommended if sx worsen call back or go to ED.     Copied from CRM [id number]. Topic: Clinical - Red Word Triage >> Nov 14, 2023 11:50 AM [name] wrote: Red Word that prompted transfer to Nurse Triage: The patient's daughter, [name], states that the patient is having dizzy spells and is feeling generally terrible. Reason for Disposition  [1] MODERATE dizziness (e.g., interferes with normal activities) AND [2] has NOT been evaluated by doctor (or NP/PA) for this  (Exception: Dizziness caused by heat exposure, sudden standing, or poor fluid intake.)  Answer Assessment - Initial Assessment Questions 1. DESCRIPTION: "Describe your dizziness."     Lightheaded  2. LIGHTHEADED: "Do you feel lightheaded?" (e.g., somewhat faint, woozy, weak upon standing)     No  3. VERTIGO: "Do you feel like either you or the room is spinning or tilting?" (i.e. vertigo)     Na  4. SEVERITY: "How bad is it?"  "Do you feel like you are going to faint?" "Can you stand and walk?"   - MILD: Feels slightly dizzy, but walking normally.   - MODERATE: Feels unsteady when walking, but not falling; interferes with normal activities (e.g., school, work).   - SEVERE: Unable to walk without falling, or requires assistance to walk without falling; feels like passing out now.       Can walk , feels "terrible" cold sx  5. ONSET:  "When did the dizziness begin?"     Couple of days ago  6. AGGRAVATING FACTORS: "Does anything make it worse?" (e.g., standing, change in head position)     Na  7. HEART RATE: "Can you tell me your heart rate?" "How many beats in 15 seconds?"  (Note: not all patients can do this)       na 8. CAUSE: "What do you think is causing the dizziness?"     Not sure other sx nasal congestion , dry cough  9. RECURRENT SYMPTOM: "Have you had dizziness before?" If Yes, ask: "When was the last time?" "What happened that time?"     Na  10. OTHER SYMPTOMS: "Do you have any other symptoms?" (e.g., fever, chest pain, vomiting, diarrhea, bleeding)       Dizziness , feels "hot" nasal congestion , dry cough  11. PREGNANCY: "Is there any chance you are pregnant?" "When was your last menstrual period?"       na  Protocols used: Dizziness - Lightheadedness-A-AH

## 2023-11-16 ENCOUNTER — Encounter: Payer: Self-pay | Admitting: Family Medicine

## 2023-11-16 ENCOUNTER — Ambulatory Visit: Admitting: Family Medicine

## 2023-11-16 VITALS — BP 130/80 | HR 96 | Temp 98.1°F | Wt 227.5 lb

## 2023-11-16 DIAGNOSIS — E559 Vitamin D deficiency, unspecified: Secondary | ICD-10-CM

## 2023-11-16 DIAGNOSIS — R42 Dizziness and giddiness: Secondary | ICD-10-CM

## 2023-11-16 DIAGNOSIS — R5383 Other fatigue: Secondary | ICD-10-CM | POA: Diagnosis not present

## 2023-11-16 LAB — CBC WITH DIFFERENTIAL/PLATELET
Basophils Absolute: 0.1 10*3/uL (ref 0.0–0.1)
Basophils Relative: 1.2 % (ref 0.0–3.0)
Eosinophils Absolute: 0.3 10*3/uL (ref 0.0–0.7)
Eosinophils Relative: 4.1 % (ref 0.0–5.0)
HCT: 43.6 % (ref 36.0–46.0)
Hemoglobin: 14.4 g/dL (ref 12.0–15.0)
Lymphocytes Relative: 41.9 % (ref 12.0–46.0)
Lymphs Abs: 3 10*3/uL (ref 0.7–4.0)
MCHC: 33 g/dL (ref 30.0–36.0)
MCV: 88.9 fl (ref 78.0–100.0)
Monocytes Absolute: 0.7 10*3/uL (ref 0.1–1.0)
Monocytes Relative: 9.6 % (ref 3.0–12.0)
Neutro Abs: 3 10*3/uL (ref 1.4–7.7)
Neutrophils Relative %: 43.2 % (ref 43.0–77.0)
Platelets: 250 10*3/uL (ref 150.0–400.0)
RBC: 4.91 Mil/uL (ref 3.87–5.11)
RDW: 15.9 % — ABNORMAL HIGH (ref 11.5–15.5)
WBC: 7 10*3/uL (ref 4.0–10.5)

## 2023-11-16 LAB — COMPREHENSIVE METABOLIC PANEL WITH GFR
ALT: 13 U/L (ref 0–35)
AST: 24 U/L (ref 0–37)
Albumin: 4 g/dL (ref 3.5–5.2)
Alkaline Phosphatase: 62 U/L (ref 39–117)
BUN: 25 mg/dL — ABNORMAL HIGH (ref 6–23)
CO2: 27 meq/L (ref 19–32)
Calcium: 9.8 mg/dL (ref 8.4–10.5)
Chloride: 103 meq/L (ref 96–112)
Creatinine, Ser: 1.78 mg/dL — ABNORMAL HIGH (ref 0.40–1.20)
GFR: 26.77 mL/min — ABNORMAL LOW (ref >60.00)
Glucose, Bld: 98 mg/dL (ref 70–99)
Potassium: 3.6 meq/L (ref 3.5–5.1)
Sodium: 140 meq/L (ref 135–145)
Total Bilirubin: 0.5 mg/dL (ref 0.2–1.2)
Total Protein: 7.8 g/dL (ref 6.0–8.3)

## 2023-11-16 LAB — VITAMIN D 25 HYDROXY (VIT D DEFICIENCY, FRACTURES): VITD: 12.96 ng/mL — ABNORMAL LOW (ref 30.00–100.00)

## 2023-11-16 LAB — VITAMIN B12: Vitamin B-12: 270 pg/mL (ref 211–911)

## 2023-11-16 LAB — TSH: TSH: 2.81 u[IU]/mL (ref 0.35–5.50)

## 2023-11-16 NOTE — Patient Instructions (Addendum)
-  I am so sorry once again for your loss. My thoughts are with you during this difficult time.  -Ordered labs for a reason of fatigue besides depression. Office will call with lab results and you will see them on MyChart.  -Recommend to take Meclizine as needed for dizziness. EKG was completed today with no concern. Discussed about possible referral to vestibular rehabilitation therapy. However, you would like to follow up with neurology first. If you change your mind, please call back to the office and will send in a referral.  -Recommend to stay hydrated with water.  -Recommend to schedule an appointment with your neurologist, counselor, and eye doctor.  -If not improved or becomes worse, please follow up with Dr. [place].

## 2023-11-16 NOTE — Progress Notes (Signed)
 Acute Office Visit   Subjective:  Patient ID: [name], female    DOB: 20-Jun-1946, 78 y.o.   MRN: [id number]  Chief Complaint  Patient presents with   Fatigue   Dizziness    HPI:  Patient reports on Monday she felt fatigue and severe dizziness. She reports she took a shower, then mostly stayed in bed all day. She reports her dizziness has improved since Monday. Currently, mild dizziness that is intermittent, lasting most of the day if she does not take Meclizine 25mg  TID PRN. When she takes a tablet, it will improve within the hour. Based on chart review, she has been seen at [facility] ED [facility] for dizziness on 02/07. Prescribed Meclizine 25mg  TID PRN.   Denies chest pain, SHOB, change in vision, nausea, or vomiting when experiencing this dizziness.   Patient has recently loss her son. She is unsure if her fatigue is more related to depression. She is going to counseling with [facility], [place]. She accidentally missed her appointment on 04/10, and needs to reschedule. Also, has an upcoming appointment with a new psychiatrist. [facility] on 11/24/2023 at 11:30 with Christa. Denies SI or HI.  Review of Systems  Neurological:  Positive for dizziness.   See HPI above      Objective:   BP 130/80 (BP Location: Right Arm, Patient Position: Sitting, Cuff Size: Large)   Pulse 96   Temp 98.1 F (36.7 C) (Oral)   Wt 227 lb 8 oz (103.2 kg)   SpO2 95%   BMI 36.72 kg/m    Physical Exam Vitals reviewed.  Constitutional:      General: She is not in acute distress.    Appearance: Normal appearance. She is obese. She is not ill-appearing, toxic-appearing or diaphoretic.  HENT:     Head: Normocephalic and atraumatic.     Right Ear: Tympanic membrane, ear canal and external ear normal. There is no impacted cerumen.     Left Ear: Tympanic membrane, ear canal and external ear normal. There is no impacted cerumen.  Eyes:     General:        Right eye: No  discharge.        Left eye: No discharge.     Conjunctiva/sclera: Conjunctivae normal.  Cardiovascular:     Rate and Rhythm: Normal rate and regular rhythm.     Heart sounds: Normal heart sounds. No murmur heard.    No friction rub. No gallop.  Pulmonary:     Effort: Pulmonary effort is normal. No respiratory distress.     Breath sounds: Normal breath sounds.  Musculoskeletal:        General: Normal range of motion.  Skin:    General: Skin is warm and dry.  Neurological:     General: No focal deficit present.     Mental Status: She is alert and oriented to person, place, and time. Mental status is at baseline.     Cranial Nerves: Cranial nerves 2-12 are intact. No facial asymmetry.     Sensory: Sensation is intact.     Motor: No weakness.     Gait: Gait abnormal Randie Heinz).  Psychiatric:        Mood and Affect: Mood normal.        Behavior: Behavior normal.        Thought Content: Thought content normal.        Judgment: Judgment normal.   EKG completed for dizziness. Sinus brady with HR  55. No significant changes compared to previous EKG on 03/26/2023.     Assessment & Plan:  Fatigue, unspecified type -     CBC with Differential/Platelet -     Comprehensive metabolic panel with GFR -     VITAMIN D 25 Hydroxy (Vit-D Deficiency, Fractures) -     Vitamin B12 -     TSH  Dizziness -     EKG 12-Lead  -Ordered labs (CBC, CMP, Vitamin D, Vitamin B12, and TSH) for a reason of fatigue besides depression. Office will call with lab results and she will see them on MyChart.  -Recommend to take Meclizine as needed for dizziness. EKG was completed today with no concern. Sinus brady at 55, but is on a beta blocker. Discussed about possible referral to vestibular rehabilitation therapy for dizziness. However, she would like to follow up with neurology first. Advised if she changes her mind, please call back to the office and will send in a referral.  -Recommend to stay hydrated with water.   -Recommend to schedule an appointment with her neurologist, counselor, and eye doctor. She reports she is needs to make an appointment with eye doctor.  -If not improved or becomes worse, please follow up with Dr. Swazil[place].   Eswin Worrell, NP

## 2023-11-17 ENCOUNTER — Encounter: Payer: Self-pay | Admitting: Family Medicine

## 2023-11-17 MED ORDER — VITAMIN D3 1.25 MG (50000 UT) PO CAPS: 1.2500 mg | ORAL_CAPSULE | ORAL | 0 refills | Status: DC

## 2023-11-17 NOTE — Addendum Note (Signed)
 Addended by: Teodoro Feller B on: 11/17/2023 10:16 AM   Modules accepted: Orders

## 2023-11-24 DIAGNOSIS — F331 Major depressive disorder, recurrent, moderate: Secondary | ICD-10-CM | POA: Diagnosis not present

## 2023-11-24 DIAGNOSIS — F4320 Adjustment disorder, unspecified: Secondary | ICD-10-CM | POA: Diagnosis not present

## 2023-11-30 ENCOUNTER — Other Ambulatory Visit: Payer: Self-pay | Admitting: Internal Medicine

## 2023-11-30 DIAGNOSIS — E1169 Type 2 diabetes mellitus with other specified complication: Secondary | ICD-10-CM

## 2023-12-01 ENCOUNTER — Ambulatory Visit (INDEPENDENT_AMBULATORY_CARE_PROVIDER_SITE_OTHER): Admitting: Psychology

## 2023-12-01 DIAGNOSIS — F331 Major depressive disorder, recurrent, moderate: Secondary | ICD-10-CM | POA: Diagnosis not present

## 2023-12-01 NOTE — Progress Notes (Signed)
 [facility] Counselor/Therapist Progress Note  Patient ID: [name], MRN: [id number],    Date: 12/01/2023  Time Spent: 11:03am-12:03pm  pt is seen for an in person visit.     Treatment Type: Individual Therapy  Reported Symptoms: pt reports sadness and grief.    Mental Status Exam: Appearance:  Well Groomed     Behavior: Appropriate  Motor: Normal  Speech/Language:  Normal Rate  Affect: Appropriate  Mood: sad  Thought process: normal  Thought content:   WNL  Sensory/Perceptual disturbances:   WNL  Orientation: oriented to person, place, time/date, and situation  Attention: Good  Concentration: Good  Memory: WNL  Fund of knowledge:  Good  Insight:   Good  Judgment:  Good  Impulse Control: Good   Risk Assessment: Danger to Self:  No Self-injurious Behavior: No Danger to Others: No Duty to Warn:no Physical Aggression / Violence:No  Access to Firearms a concern: No  Gang Involvement:No   Subjective: Counselor assessed pt current functioning per pt report. Processed w/pt sadness and grief.  Validated and normalized grief and sadness and provided psychoeducation re: grief.  Discussed supports to her and reflected support received through celebration of life.   Pt affect congruent w/ report of sadness.  Pt reports she is missing her son.  Pt reports she has some days that are hard w/ mood, sadness and down. Pt is giving herself permission for grief.  Pt shared about his celebration of life on 11/12/23 and support and positive turn out for son.  Pt shared the program and discussed steps taking now w/ estate.  Pt reports on her supports and good to talk w/ close friend.    Interventions:  supportive  Diagnosis:Major depressive disorder, recurrent episode, moderate (HCC)  Plan: pt to f/u in 3-4 weeks for counseling.  Pt to f/u as scheduled w/ PCP and psychiatrist as scheduled.    Individualized Treatment Plan Strengths: enjoys time w/ daughter, son and  w/ extended family and church community  Supports: her daughter   Goal/Needs for Treatment:  In order of importance to patient 1) engaged in activities/ talk w/ friends 2) decreased depressed moods 3) ---   Client Statement of Needs: "Get out of this mood I'm in. "   Treatment Level:out pt counseling  Symptoms:depressed mood, tearfulness, loss of interest, difficulty w/ motivation, withdrawn from interacting.   Client Treatment Preferences:biweekly in person counseling.  Continue medication management w/ PCP.    Healthcare consumer's goal for treatment:  Counselor, Clydie Darter, [facility] will support the patient's ability to achieve the goals identified. Cognitive Behavioral Therapy, Assertive Communication/Conflict Resolution Training, Relaxation Training, ACT, Humanistic and other evidenced-based practices will be used to promote progress towards healthy functioning.   Healthcare consumer will: Actively participate in therapy, working towards healthy functioning.    *Justification for Continuation/Discontinuation of Goal: R=Revised, O=Ongoing, A=Achieved, D=Discontinued  Goal 1)  Increased engagement w/ activities she enjoys and w/ social interactions AEB pt report and counselor observation. Baseline date 12/02/22: Progress towards goal 0; How Often - Daily Target Date Goal Was reviewed Status Code Progress towards goal/Likert rating  12/02/23                Goal 2) Increased coping w/ losses and w/ depressed moods AEB utilizing coping skills daily.   Baseline date 12/02/22: Progress towards goal 0; How Often - Daily Target Date Goal Was reviewed Status Code Progress towards goal  12/02/23  This plan has been reviewed and created by the following participants:  This plan will be reviewed at least every 12 months. Date Behavioral Health Clinician Date Guardian/Patient   12/02/22  [facility] Murrel Arnt [facility] 12/02/22 Verbal Consent Provided                          Clydie Darter LCMHC

## 2023-12-01 NOTE — Addendum Note (Signed)
 Addended by: Mattheu Brodersen M on: 12/01/2023 01:26 PM   Modules accepted: Level of Service

## 2023-12-05 DIAGNOSIS — N184 Chronic kidney disease, stage 4 (severe): Secondary | ICD-10-CM | POA: Diagnosis not present

## 2023-12-05 DIAGNOSIS — Z85528 Personal history of other malignant neoplasm of kidney: Secondary | ICD-10-CM | POA: Diagnosis not present

## 2023-12-05 DIAGNOSIS — N3941 Urge incontinence: Secondary | ICD-10-CM | POA: Diagnosis not present

## 2023-12-11 ENCOUNTER — Other Ambulatory Visit: Payer: Self-pay | Admitting: Family Medicine

## 2023-12-11 DIAGNOSIS — J3089 Other allergic rhinitis: Secondary | ICD-10-CM

## 2023-12-12 NOTE — Telephone Encounter (Signed)
 Called patient to check as to why patient has not brought her forms for [employer] P[place] back to clinic.  Patient states she has lost her son this last month and has not had time to fill out paperwork.  Patient states she will fill out forms and try to bring by this week.  Will get forms and fax to [employer] for patient to get Breztri  160 mcg assistance.

## 2023-12-29 ENCOUNTER — Encounter: Payer: Self-pay | Admitting: Psychology

## 2023-12-29 ENCOUNTER — Ambulatory Visit: Admitting: Psychology

## 2023-12-29 DIAGNOSIS — F331 Major depressive disorder, recurrent, moderate: Secondary | ICD-10-CM | POA: Diagnosis not present

## 2023-12-29 NOTE — Progress Notes (Unsigned)
 [facility] Counselor Initial Adult Exam  Name: [name] Date: 12/29/2023 MRN: [id number] DOB: 03-10-1946 PCP: [place], [name], MD  Time spent: 9:56am-10:00an Pt was seen for an in person visit.  Guardian/Payee:  NA    Paperwork requested: No   Reason for Visit /Presenting Problem: Pt had been seeing Dr. Honey Lusty for MDD since 04/2022.  She is transferred from his care as he recently left the practice.  Pt reports it has been beneficial to have counseling and would like to continue.   Mental Status Exam: Appearance:   Well Groomed     Behavior:  Appropriate  Motor:  Normal  Speech/Language:   Clear and Coherent  Affect:  Appropriate  Mood:  depressed  Thought process:  normal  Thought content:    WNL  Sensory/Perceptual disturbances:    WNL  Orientation:  oriented to person, place, and time/date  Attention:  Good  Concentration:  Good  Memory:  WNL  Fund of knowledge:   Good  Insight:    Good  Judgment:   Good  Impulse Control:  Good     Reported Symptoms:  Pt reports feeling down.  Pt reports days were she is tearful and doesn't no why.  Pt reports some day difficulty getting up/out of room and days that doesn't want to go anywhere.  Pt reports more withdrawn- days that doesn't want to talk to anyone.  Pt reports lack of motivation and struggling w/ follow through on things she wants to do.  Pt reports more loneliness w/ losses/deaths, less family visiting. She denied suicidal and homicidal ideation.   Risk Assessment: Danger to Self:  No Self-injurious Behavior: No Danger to Others: No Duty to Warn:no Physical Aggression / Violence:No  Access to Firearms a concern: No  Gang Involvement:No  Patient / guardian was educated about steps to take if suicide or homicide risk level increases between visits: n/a While future psychiatric events cannot be accurately predicted, the patient does not currently require acute inpatient psychiatric care and does  not currently meet [place]  involuntary commitment criteria.  Substance Abuse History: Current substance abuse: No     Past Psychiatric History:   Previous psychological history is significant for depression Outpatient Providers: Dr. Tommy Frames for counseling about biweekly since 04/2022.   History of Psych Hospitalization: No  Psychological Testing: NA   Abuse History:  Victim of: No., NA   Report needed: No. Victim of Neglect:No. Perpetrator of NA  Witness / Exposure to Domestic Violence: No   Protective Services Involvement: No  Witness to [employer] Violence:  No   Family History:  Family History  Problem Relation Age of Onset   Rheum arthritis Mother    Stroke Mother    Prostate cancer Father    Heart disease Father    Diabetes Brother    Dementia Brother    Parkinsonism Brother    Diabetes Brother    Cancer Brother    Dementia Brother    Kidney disease Son    Bipolar disorder Daughter    Colon cancer Neg Hx    Esophageal cancer Neg Hx    Pancreatic cancer Neg Hx    Liver disease Neg Hx    Rectal cancer Neg Hx    Stomach cancer Neg Hx   Pt grew up in [place] area w/ mom and dad.  Pt was one of 7 kids.  Pt reports only her and her sister left.  Pt reports that 3 of  her brothers died in 2 years from 2020-2022.  Pt reports that she was very close to her youngest brother who was 7 years older than her and he died in 08-Jan-2021.    Living situation: the patient lives in her home and her daughter lives w/ her.    Sexual Orientation: Straight  Relationship Status: divorced.  Pt reports her husband left when her daughter- the youngest- was 21 years old Name of spouse / other:NA If a parent, number of children / ages:Patient has two sons and one daughter. She has two grandchildren by her oldest son- they live in [place].  Support Systems: Pt reports her daughter is her support.    Financial Stress:  No   Income/Employment/Disability: Product manager.     Military Service: No   Educational History: Education: high school diploma/GED  Religion/Sprituality/World View: Christian/Baptist  Pt reports she was very active in church in the past.  Pt reports she has many close friends but has been struggling w/ the paster and doesn't agree w/ some things he says.  Pt reports that she hasn't attended in person this year- she will still watch online.   Any cultural differences that may affect / interfere with treatment:  not applicable   Recreation/Hobbies: Being with daughter. Attending events at church.   Stressors: Loss of family members due to death and decreased visits from nieces as they have grown.     Other:  Concern for her son's health    Strengths: Supportive Relationships  Barriers:  NA   Legal History: Pending legal issue / charges: The patient has no significant history of legal issues. History of legal issue / charges: NA  Medical History/Surgical History: reviewed Past Medical History:  Diagnosis Date   Allergy    Anxiety    C. difficile diarrhea    history of   CAD (coronary artery disease), native coronary artery    a. 05/2017 showed 100% mLCx tx with DES, otherwise 20% mRCA, 50% ramus, 30% dLAD, 20% prox-mid LAD, 20% mLAD treated medically.    Cancer [facility])    Right kidney CA removed.    CKD (chronic kidney disease) stage 3, GFR 30-59 ml/min (HCC) 10/11/2016   s/p R nephrectomy   Colon polyps 01-09-07   HYPERPLASTIC   COPD (chronic obstructive pulmonary disease) (HCC)    former smoker   Depression    Gastritis    GERD (gastroesophageal reflux disease)    History of echocardiogram    Echo 3/18:  Moderate LVH, EF 60-65, grade 1 diastolic dysfunction, calcified aortic valve, mild MR, moderate LAE   History of nuclear stress test    Myoview  3/18: Mod size and intensity fixed septal defect, may be artifact.Opposite mod size and intensity lat defect, which is reversible and could represent ischemia or possibly artifact  (SDS 4). LVEF 71% with normal wall motion. Intermediate risk study. >> images reviewed with Dr. Ola Berger - no sig ischemia; med rx    Hx of cardiovascular stress test    Lexiscan  Myoview  6/16:  EF 70%, no scar or ischemia; Low Risk   Hyperlipidemia    Hypertension    Myocardial infarction (HCC)    01-08-2017   Neuropathy    Orthostatic hypotension 05/28/2017   OSA (obstructive sleep apnea) 01/16/2021   Osteoarthritis    Pre-diabetes    no meds   Rheumatoid arthritis (HCC)    RA (Dr. Alva Jewels) Bilateral hands   S/P angioplasty with stent 05/27/17 to LCX with DES  05/28/2017   Sleep apnea 2023   does not use CPAP   Thyroid  disease     Past Surgical History:  Procedure Laterality Date   ABDOMINAL HYSTERECTOMY  1978   ANTERIOR CERVICAL DECOMP/DISCECTOMY FUSION N/A 05/29/2018   Procedure: ANTERIOR CERVICAL DECOMPRESSION FUSION - CERVICAL FIVE-CERVICAL SIX - CERVICAL SIX-CERVICAL SEVEN;  Surgeon: Agustina Aldrich, MD;  Location: [facility] OR;  Service: Neurosurgery;  Laterality: N/A;   BACK SURGERY     x 2   BREAST LUMPECTOMY WITH RADIOACTIVE SEED LOCALIZATION Right 04/13/2022   Procedure: RIGHT BREAST SEED LUMPECTOMY;  Surgeon: Sim Dryer, MD;  Location: [facility];  Service: General;  Laterality: Right;   CARPAL TUNNEL RELEASE Left    COLONOSCOPY     COLONOSCOPY W/ BIOPSIES AND POLYPECTOMY     Hx: of   CORONARY STENT INTERVENTION N/A 05/27/2017   Procedure: CORONARY STENT INTERVENTION;  Surgeon: Odie Benne, MD;  Location: [facility] INVASIVE CV LAB;  Service: Cardiovascular;  Laterality: N/A;   ESOPHAGOGASTRODUODENOSCOPY     HNP     LEFT HEART CATH AND CORONARY ANGIOGRAPHY N/A 05/27/2017   Procedure: LEFT HEART CATH AND CORONARY ANGIOGRAPHY;  Surgeon: Odie Benne, MD;  Location: [facility] INVASIVE [facility] LAB;  Service: Cardiovascular;  Laterality: N/A;   LUMBAR LAMINECTOMY/DECOMPRESSION MICRODISCECTOMY Left 02/12/2013   Procedure: LUMBAR TWO THREE, LUMBAR THREE FOUR,  LUMBAR FOUR FIVE  LAMINECTOMY/DECOMPRESSION MICRODISCECTOMY 3 LEVELS;  Surgeon: Baruch Bosch, MD;  Location: [facility] NEURO ORS;  Service: Neurosurgery;  Laterality: Left;   NEPHRECTOMY Right 2010   10.rcc cancer   TOTAL KNEE ARTHROPLASTY Right    Redo   UPPER GASTROINTESTINAL ENDOSCOPY      Medications: Current Outpatient Medications  Medication Sig Dispense Refill   acetaminophen  (TYLENOL ) 500 MG tablet Take 1,000 mg by mouth as needed for moderate pain or headache.      aspirin  81 MG chewable tablet Chew 81 mg by mouth daily.     Budeson-Glycopyrrol-Formoterol  (BREZTRI  AEROSPHERE) 160-9-4.8 MCG/ACT AERO Inhale 2 puffs into the lungs in the morning and at bedtime. 2 each 11   budeson-glycopyrrolate -formoterol  (BREZTRI  AEROSPHERE) 160-9-4.8 MCG/ACT AERO Inhale 2 puffs into the lungs in the morning and at bedtime.     chlorhexidine  (PERIDEX ) 0.12 % solution as needed (dental work).     Cholecalciferol (VITAMIN D3) 1.25 MG (50000 UT) CAPS Take 1 capsule (1.25 mg total) by mouth every 7 (seven) days. 12 capsule 0   Evolocumab  (REPATHA  SURECLICK) 140 MG/ML SOAJ Inject 140 mg into the skin every 14 (fourteen) days. 2 mL 11   famotidine  (PEPCID ) 20 MG tablet TAKE 1 TABLET BY MOUTH EVERY MORNING AND 1 TABLET BY MOUTH EVERY DAY AT BEDTIME 180 tablet 2   fluticasone  (FLONASE ) 50 MCG/ACT nasal spray PLACE 2 SPRAYS INTO BOTH NOSTRILS AT BEDTIME AS NEEDED FOR ALLERGIES. 48 mL 1   gabapentin  (NEURONTIN ) 300 MG capsule TAKE 1 CAPSULE BY MOUTH EVERY DAY AT BEDTIME *PATIENT NEEDS APPOINTMENT* 30 capsule 1   hydrochlorothiazide  (HYDRODIURIL ) 25 MG tablet TAKE 1 TABLET BY MOUTH ONCE DAILY 90 tablet 2   ibuprofen  (ADVIL ) 600 MG tablet Take 600 mg by mouth every 6 (six) hours as needed.     losartan  (COZAAR ) 100 MG tablet TAKE 1 TABLET BY MOUTH EVERY MORNING 90 tablet 2   meclizine  (ANTIVERT ) 25 MG tablet Take 1 tablet (25 mg total) by mouth 3 (three) times daily as needed for dizziness. 30 tablet 0   metoprolol   succinate (TOPROL -XL) 100 MG 24 hr  tablet TAKE 1 TABLET BY MOUTH AT NOON 90 tablet 2   montelukast  (SINGULAIR ) 10 MG tablet TAKE ONE TABLET BY MOUTH EVERYDAY AT BEDTIME 30 tablet 3   MYRBETRIQ  50 MG TB24 tablet Take 50 mg by mouth daily.     nitroGLYCERIN  (NITROSTAT ) 0.4 MG SL tablet Place 1 tablet (0.4 mg total) under the tongue every 5 (five) minutes as needed for chest pain. 25 tablet 2   ondansetron  (ZOFRAN -ODT) 4 MG disintegrating tablet 4mg  ODT q4 hours prn nausea/vomit 20 tablet 0   pantoprazole  (PROTONIX ) 40 MG tablet TAKE 1 TABLET BY MOUTH ONCE DAILY 30 tablet 9   rosuvastatin  (CRESTOR ) 10 MG tablet TAKE 1 TABLET BY MOUTH EVERY DAY AT BEDTIME 90 tablet 1   sertraline  (ZOLOFT ) 100 MG tablet Take 100 mg by mouth daily.     triamcinolone  cream (KENALOG ) 0.1 % Apply 1 Application topically 3 times/day as needed-between meals & bedtime. 30 g 1   No current facility-administered medications for this visit.    Allergies  Allergen Reactions   Oxycodone -Acetaminophen  Hives and Itching    hallucinations   Percocet [Oxycodone -Acetaminophen ] Hives, Itching and Other (See Comments)    hallucinations   Pravastatin  Sodium Other (See Comments)    Muscle aches Muscle aches   Hydrocodone  Other (See Comments)    "crazy dreams"   Aspirin  Other (See Comments)    GI upset Other reaction(s): Other (See Comments) REACTION: GI upset    Diagnoses:  Major depressive disorder, recurrent episode, moderate    Plan of Care: The patient is a 78 year old Black female who was referred by recent therapist who left the practice.  Pt has been dx w/ MDD and has been attending counseling about biweekly.  Pt daughter who lives w/ her is supportive.  Pt continues to express feeling down, sad, tearful, lack of motivation, social isolation, and avoiding pleasurable activities. She denied suicidal and homicidal ideation. The pt participated in tx plan development and will f/u w/ counseling in 2 weeks.  Pt will  continue w/ PCP for medication management.         Individualized Treatment Plan Strengths: enjoys time w/ daughter, son and w/ extended family and church community  Supports: her daughter   Goal/Needs for Treatment:  In order of importance to patient 1) engaged in activities/ talk w/ friends 2) decreased depressed moods 3) ---   Client Statement of Needs: "Get out of this mood I'm in. "     Treatment Level:out pt counseling  Symptoms:depressed mood, tearfulness, loss of interest, difficulty w/ motivation, withdrawn from interacting.   Client Treatment Preferences:biweekly in person counseling.  Continue medication management w/ PCP.    Healthcare consumer's goal for treatment:  Counselor, Clydie Darter, [facility] will support the patient's ability to achieve the goals identified. Cognitive Behavioral Therapy, Assertive Communication/Conflict Resolution Training, Relaxation Training, ACT, Humanistic and other evidenced-based practices will be used to promote progress towards healthy functioning.   Healthcare consumer will: Actively participate in therapy, working towards healthy functioning.    *Justification for Continuation/Discontinuation of Goal: R=Revised, O=Ongoing, A=Achieved, D=Discontinued  Goal 1)  Increased engagement w/ activities she enjoys and w/ social interactions AEB pt report and counselor observation. Baseline date 12/02/22: Progress towards goal 0; How Often - Daily Target Date Goal Was reviewed Status Code Progress towards goal/Likert rating  12/02/23                Goal 2) Increased coping w/ losses and w/ depressed moods AEB utilizing coping  skills daily.   Baseline date 12/02/22: Progress towards goal 0; How Often - Daily Target Date Goal Was reviewed Status Code Progress towards goal  12/02/23                  This plan has been reviewed and created by the following participants:  This plan will be reviewed at least every 12 months. Date Behavioral Health  Clinician Date Guardian/Patient   12/02/22  [facility] Murrel Arnt [facility] 12/02/22 Verbal Consent Provided                         Clydie Darter [facility]               Lillie, LCMHC

## 2023-12-30 DIAGNOSIS — H524 Presbyopia: Secondary | ICD-10-CM | POA: Diagnosis not present

## 2023-12-30 DIAGNOSIS — H2513 Age-related nuclear cataract, bilateral: Secondary | ICD-10-CM | POA: Diagnosis not present

## 2023-12-30 DIAGNOSIS — D23122 Other benign neoplasm of skin of left lower eyelid, including canthus: Secondary | ICD-10-CM | POA: Diagnosis not present

## 2024-01-03 DIAGNOSIS — F331 Major depressive disorder, recurrent, moderate: Secondary | ICD-10-CM | POA: Diagnosis not present

## 2024-01-03 DIAGNOSIS — F4320 Adjustment disorder, unspecified: Secondary | ICD-10-CM | POA: Diagnosis not present

## 2024-01-11 ENCOUNTER — Encounter: Payer: Self-pay | Admitting: Family Medicine

## 2024-01-11 ENCOUNTER — Ambulatory Visit: Admitting: Family Medicine

## 2024-01-11 ENCOUNTER — Ambulatory Visit: Payer: Self-pay

## 2024-01-11 VITALS — BP 126/74 | HR 67 | Resp 16 | Ht 66.0 in | Wt 225.5 lb

## 2024-01-11 DIAGNOSIS — G4733 Obstructive sleep apnea (adult) (pediatric): Secondary | ICD-10-CM

## 2024-01-11 DIAGNOSIS — F419 Anxiety disorder, unspecified: Secondary | ICD-10-CM | POA: Diagnosis not present

## 2024-01-11 DIAGNOSIS — J3089 Other allergic rhinitis: Secondary | ICD-10-CM

## 2024-01-11 DIAGNOSIS — R42 Dizziness and giddiness: Secondary | ICD-10-CM

## 2024-01-11 DIAGNOSIS — E559 Vitamin D deficiency, unspecified: Secondary | ICD-10-CM | POA: Diagnosis not present

## 2024-01-11 DIAGNOSIS — N184 Chronic kidney disease, stage 4 (severe): Secondary | ICD-10-CM

## 2024-01-11 DIAGNOSIS — J449 Chronic obstructive pulmonary disease, unspecified: Secondary | ICD-10-CM

## 2024-01-11 MED ORDER — IPRATROPIUM BROMIDE 0.06 % NA SOLN: 2.0000 | Freq: Four times a day (QID) | NASAL | 1 refills | Status: AC

## 2024-01-11 NOTE — Assessment & Plan Note (Addendum)
 Cr 1.5-1.6 and eGFR 30's, last eGFR 26. Problem has been following by urologist. Appointment with nephrology will be arranged. Continue losartan  100 mg daily as well as low-salt diet and avoidance of NSAIDs.

## 2024-01-11 NOTE — Assessment & Plan Note (Signed)
 She is not wearing her CPAP, encouraged doing so. Follow-up with pulmonologist.

## 2024-01-11 NOTE — Progress Notes (Signed)
 ACUTE VISIT Chief Complaint  Patient presents with   Dizziness    Ongoing, happening all the time, meclizine  helps some.    Nasal Congestion    Started Sunday    HPI: [name] is a 78 y.o. female with a PMHx significant for CAD, HTN, PAD, COPD, OSA, GERD, DM II, HLD, polyneuropathy, CKD III, B12 deficiency, and vitamin D  defciency, who is here today with her daughter complaining of dizziness.   Patient complains of dizziness that she has had for some time.  She describes the dizziness as a movement sensation when she gets up too quickly that lasts for a minute or two.  Discussed on 07/20/23 and recently seen here in the office,11/17/23, blood work ordered. Has reported episodes of lightheadedness/dizziness since 2022.  Dizziness This is a recurrent problem. The current episode started more than 1 month ago. The problem has been waxing and waning. Associated symptoms include congestion and fatigue. Pertinent negatives include no abdominal pain, anorexia, change in bowel habit, chest pain, coughing, diaphoresis, fever, myalgias, nausea, rash, sore throat, swollen glands, urinary symptoms, visual change, vomiting or weakness. The symptoms are aggravated by standing.   She was seen in urgent care in 01/2023 for a similar problem and given meclizine , which helped slightly with her symptoms.  She had a head CT on 02/13/23: No CT evidence for acute intracranial abnormality.  6 mm fat density mass at the right CP angle likely reflecting a small lipoma, no change compared to prior.    Daughter mentions she has had one fall recently on grass a few days ago, no head trauma.  She is trying to avoid driving because she has occasionally had dizziness episodes while driving.   Also endorses some associated chills and nasal congestion, and has been taking Claritin and Flonase  nasal spray. She has a hx of allergies. She has some hearing problems and follows with audiology. Also saw ENT for  headaches in 12/2020 and 01/2021.   OSA: Her daughter states she is not using her CPAP.  Peripheral neuropathy: Next appointment with neurology is 01/25/2024.  Has not tried vestibular therapy.  Denies any associated chest pain, SOB, wheezing, cough, or recent sick contacts.   COPD: Follows with pulmonologist. She is on Breztri  2 puff bid, which has helped.  Depression and anxiety: She mentions her son passed in March/2025, he had several health complications.  She has been seeing a psychotherapist and following with psychiatry.  Currently she is on Sertraline  100 mg daily. She lives with her daughter, who works from home.  CKD III-IV: She states that this problem has been followed by urologist, s/p right nephrectomy due to renal cell cancer in 2010. She has an appointment with a urologist in 03/2024.  She has not noted foam in urina,gross hematuria, or decreased urine output.  Lab Results  Component Value Date   NA 140 11/16/2023   CL 103 11/16/2023   K 3.6 11/16/2023   CO2 27 11/16/2023   BUN 25 (H) 11/16/2023   CREATININE 1.78 (H) 11/16/2023   GFR 26.77 (L) 11/16/2023   CALCIUM  9.8 11/16/2023   ALBUMIN  4.0 11/16/2023   GLUCOSE 98 11/16/2023   Vitamin D  deficiency:  She takes vitamin D  supplementation.  Lab Results  Component Value Date   VD25OH 12.96 (L) 11/16/2023   Review of Systems  Constitutional:  Positive for fatigue. Negative for activity change, appetite change, diaphoresis and fever.  HENT:  Positive for congestion and postnasal drip. Negative for sore  throat.   Eyes:  Negative for discharge and redness.  Respiratory:  Negative for cough, wheezing and stridor.   Cardiovascular:  Negative for chest pain.  Gastrointestinal:  Negative for abdominal pain, anorexia, change in bowel habit, nausea and vomiting.  Endocrine: Negative for cold intolerance and heat intolerance.  Genitourinary:  Negative for dysuria.  Musculoskeletal:  Negative for myalgias.  Skin:   Negative for rash.  Allergic/Immunologic: Positive for environmental allergies.  Neurological:  Positive for dizziness. Negative for syncope, facial asymmetry and weakness.  Psychiatric/Behavioral:  Negative for confusion and hallucinations.   See other pertinent positives and negatives in HPI.  Current Outpatient Medications on File Prior to Visit  Medication Sig Dispense Refill   acetaminophen  (TYLENOL ) 500 MG tablet Take 1,000 mg by mouth as needed for moderate pain or headache.      aspirin  81 MG chewable tablet Chew 81 mg by mouth daily.     Budeson-Glycopyrrol-Formoterol  (BREZTRI  AEROSPHERE) 160-9-4.8 MCG/ACT AERO Inhale 2 puffs into the lungs in the morning and at bedtime. 2 each 11   budeson-glycopyrrolate -formoterol  (BREZTRI  AEROSPHERE) 160-9-4.8 MCG/ACT AERO Inhale 2 puffs into the lungs in the morning and at bedtime.     chlorhexidine  (PERIDEX ) 0.12 % solution as needed (dental work).     Cholecalciferol (VITAMIN D3) 1.25 MG (50000 UT) CAPS Take 1 capsule (1.25 mg total) by mouth every 7 (seven) days. 12 capsule 0   Evolocumab  (REPATHA  SURECLICK) 140 MG/ML SOAJ Inject 140 mg into the skin every 14 (fourteen) days. 2 mL 11   famotidine  (PEPCID ) 20 MG tablet TAKE 1 TABLET BY MOUTH EVERY MORNING AND 1 TABLET BY MOUTH EVERY DAY AT BEDTIME 180 tablet 2   fluticasone  (FLONASE ) 50 MCG/ACT nasal spray PLACE 2 SPRAYS INTO BOTH NOSTRILS AT BEDTIME AS NEEDED FOR ALLERGIES. 48 mL 1   gabapentin  (NEURONTIN ) 300 MG capsule TAKE 1 CAPSULE BY MOUTH EVERY DAY AT BEDTIME *PATIENT NEEDS APPOINTMENT* 30 capsule 1   hydrochlorothiazide  (HYDRODIURIL ) 25 MG tablet TAKE 1 TABLET BY MOUTH ONCE DAILY 90 tablet 2   ibuprofen  (ADVIL ) 600 MG tablet Take 600 mg by mouth every 6 (six) hours as needed.     losartan  (COZAAR ) 100 MG tablet TAKE 1 TABLET BY MOUTH EVERY MORNING 90 tablet 2   meclizine  (ANTIVERT ) 25 MG tablet Take 1 tablet (25 mg total) by mouth 3 (three) times daily as needed for dizziness. 30 tablet 0    metoprolol  succinate (TOPROL -XL) 100 MG 24 hr tablet TAKE 1 TABLET BY MOUTH AT NOON 90 tablet 2   montelukast  (SINGULAIR ) 10 MG tablet TAKE ONE TABLET BY MOUTH EVERYDAY AT BEDTIME 30 tablet 3   MYRBETRIQ  50 MG TB24 tablet Take 50 mg by mouth daily.     nitroGLYCERIN  (NITROSTAT ) 0.4 MG SL tablet Place 1 tablet (0.4 mg total) under the tongue every 5 (five) minutes as needed for chest pain. 25 tablet 2   ondansetron  (ZOFRAN -ODT) 4 MG disintegrating tablet 4mg  ODT q4 hours prn nausea/vomit 20 tablet 0   pantoprazole  (PROTONIX ) 40 MG tablet TAKE 1 TABLET BY MOUTH ONCE DAILY 30 tablet 9   rosuvastatin  (CRESTOR ) 10 MG tablet TAKE 1 TABLET BY MOUTH EVERY DAY AT BEDTIME 90 tablet 1   sertraline  (ZOLOFT ) 100 MG tablet Take 100 mg by mouth daily.     triamcinolone  cream (KENALOG ) 0.1 % Apply 1 Application topically 3 times/day as needed-between meals & bedtime. 30 g 1   No current facility-administered medications on file prior to visit.    Past  Medical History:  Diagnosis Date   Allergy    Anxiety    C. difficile diarrhea    history of   CAD (coronary artery disease), native coronary artery    a. 05/2017 showed 100% mLCx tx with DES, otherwise 20% mRCA, 50% ramus, 30% dLAD, 20% prox-mid LAD, 20% mLAD treated medically.    Cancer [facility])    Right kidney CA removed.    CKD (chronic kidney disease) stage 3, GFR 30-59 ml/min (HCC) 10/11/2016   s/p R nephrectomy   Colon polyps 2008   HYPERPLASTIC   COPD (chronic obstructive pulmonary disease) (HCC)    former smoker   Depression    Gastritis    GERD (gastroesophageal reflux disease)    History of echocardiogram    Echo 3/18:  Moderate LVH, EF 60-65, grade 1 diastolic dysfunction, calcified aortic valve, mild MR, moderate LAE   History of nuclear stress test    Myoview  3/18: Mod size and intensity fixed septal defect, may be artifact.Opposite mod size and intensity lat defect, which is reversible and could represent ischemia or possibly artifact  (SDS 4). LVEF 71% with normal wall motion. Intermediate risk study. >> images reviewed with Dr. Ola Berger - no sig ischemia; med rx    Hx of cardiovascular stress test    Lexiscan  Myoview  6/16:  EF 70%, no scar or ischemia; Low Risk   Hyperlipidemia    Hypertension    Myocardial infarction (HCC)    2018   Neuropathy    Orthostatic hypotension 05/28/2017   OSA (obstructive sleep apnea) 01/16/2021   Osteoarthritis    Pre-diabetes    no meds   Rheumatoid arthritis (HCC)    RA (Dr. Alva Jewels) Bilateral hands   S/P angioplasty with stent 05/27/17 to LCX with DES  05/28/2017   Sleep apnea 2023   does not use CPAP   Thyroid  disease    Allergies  Allergen Reactions   Oxycodone -Acetaminophen  Hives and Itching    hallucinations   Percocet [Oxycodone -Acetaminophen ] Hives, Itching and Other (See Comments)    hallucinations   Pravastatin  Sodium Other (See Comments)    Muscle aches Muscle aches   Hydrocodone  Other (See Comments)    crazy dreams   Aspirin  Other (See Comments)    GI upset Other reaction(s): Other (See Comments) REACTION: GI upset    Social History   Socioeconomic History   Marital status: Divorced    Spouse name: Not on file   Number of children: 3   Years of education: 12   Highest education level: Not on file  Occupational History   Occupation: Retired    Associate Professor: RETIRED  Tobacco Use   Smoking status: Former    Current packs/day: 0.00    Average packs/day: 0.2 packs/day for 53.1 years (10.6 ttl pk-yrs)    Types: Cigarettes    Start date: 88    Quit date: 09/07/2021    Years since quitting: 2.3   Smokeless tobacco: Never   Tobacco comments:    pt has been an on and off smoker since age 80  Vaping Use   Vaping status: Never Used  Substance and Sexual Activity   Alcohol use: No    Alcohol/week: 0.0 standard drinks of alcohol   Drug use: No   Sexual activity: Not Currently    Birth control/protection: Surgical    Comment: hyst  Other Topics  Concern   Not on file  Social History Narrative   Single, dgtr lives with her   Current on/off  smoker   Alcohol use- no   Drug use-no   Regular Exercise-yes   Social Drivers of Health   Financial Resource Strain: Low Risk  (09/08/2022)   Overall Financial Resource Strain (CARDIA)    Difficulty of Paying Living Expenses: Not hard at all  Food Insecurity: No Food Insecurity (10/20/2022)   Hunger Vital Sign    Worried About Running Out of Food in the Last Year: Never true    Ran Out of Food in the Last Year: Never true  Transportation Needs: No Transportation Needs (09/08/2022)   PRAPARE - Administrator, Civil Service (Medical): No    Lack of Transportation (Non-Medical): No  Physical Activity: Inactive (09/08/2022)   Exercise Vital Sign    Days of Exercise per Week: 0 days    Minutes of Exercise per Session: 0 min  Stress: Stress Concern Present (09/08/2022)   [employer] of Occupational Health - Occupational Stress Questionnaire    Feeling of Stress : To some extent  Social Connections: Moderately Integrated (09/08/2022)   Social Connection and Isolation Panel [NHANES]    Frequency of Communication with Friends and Family: More than three times a week    Frequency of Social Gatherings with Friends and Family: More than three times a week    Attends Religious Services: More than 4 times per year    Active Member of [employer] or Organizations: Yes    Attends Banker Meetings: More than 4 times per year    Marital Status: Divorced    Vitals:   01/11/24 1556  BP: 126/74  Pulse: 67  Resp: 16  SpO2: 96%   Body mass index is 36.4 kg/m.  Physical Exam Vitals and nursing note reviewed.  Constitutional:      General: She is not in acute distress.    Appearance: She is well-developed.  HENT:     Head: Normocephalic and atraumatic.     Right Ear: Tympanic membrane, ear canal and external ear normal.     Left Ear: Tympanic membrane, ear canal and external  ear normal.     Ears:     Comments: Right-sided Dix-Hallpike maneuver elicited dizziness/movement sensation, also when setting up from supine position.    Nose:     Right Nostril: No occlusion.     Left Nostril: No occlusion.     Right Turbinates: Enlarged.     Left Turbinates: Enlarged.     Right Sinus: No maxillary sinus tenderness or frontal sinus tenderness.     Left Sinus: No maxillary sinus tenderness or frontal sinus tenderness.     Mouth/Throat:     Mouth: Mucous membranes are moist.     Pharynx: Oropharynx is clear.   Eyes:     Conjunctiva/sclera: Conjunctivae normal.    Cardiovascular:     Rate and Rhythm: Normal rate and regular rhythm.     Pulses:          Dorsalis pedis pulses are 2+ on the right side and 2+ on the left side.     Heart sounds: No murmur heard. Pulmonary:     Effort: Pulmonary effort is normal. No respiratory distress.     Breath sounds: Normal breath sounds.  Abdominal:     Palpations: Abdomen is soft. There is no hepatomegaly or mass.     Tenderness: There is no abdominal tenderness.   Musculoskeletal:     Right lower leg: No edema.     Left lower leg: No edema.  Lymphadenopathy:  Cervical: No cervical adenopathy.   Skin:    General: Skin is warm.     Findings: No erythema or rash.   Neurological:     General: No focal deficit present.     Mental Status: She is alert and oriented to person, place, and time.     Deep Tendon Reflexes:     Reflex Scores:      Patellar reflexes are 2+ on the right side and 2+ on the left side.    Comments: Gait assisted with a cane.  Psychiatric:        Mood and Affect: Mood and affect normal.    ASSESSMENT AND PLAN:  Ms. Hokenson was seen today for dizziness and follow up.   Non-seasonal allergic rhinitis, unspecified trigger Assessment & Plan: This problem could be contributing to some of her symptoms, including dizziness. I do not think there is an ongoing bacterial/infectious process at this  time. She has seen ENT in 01/2021, CT showed clear sinuses in 2022 and in 01/2023 (head CT). Continue Flonase  nasal spray daily at bedtime as needed. Atrovent  nasal spray added today to use twice daily. Continue nasal irrigations as needed throughout the day, Singulair  10 mg daily, and daily Claritin. She agrees with referral to immunologist.  Orders: -     Ambulatory referral to Nephrology -     Ipratropium Bromide ; Place 2 sprays into both nostrils 4 (four) times daily.  Dispense: 15 mL; Refill: 1  Dizziness Assessment & Plan: Problem has been intermittent since 2022. We reviewed possible etiologies, some of her core mobilities as well as medications could be contributing factors. History and examination do not suggest a serious process. She follows with cardiologist and neurologies for some of her chronic medical conditions, recommend also mentioning this during follow up visits. Very mild dizzy spell with Dix-Hallpike maneuver to the right. ? Vertigo,prefers to hold on vestibular therapy for now. Head CT done in 01/2023 due to headache and dizziness, negative for acute intracranial abnormality.  6 mm fat density mass in the right CP angle likely a small lipoma, no changes when compared with prior images.  Fall precautions to continue. I do not think imaging or blood work are needed at this time. Clearly instructed about warning signs.   CKD (chronic kidney disease), stage IV (HCC) Assessment & Plan: Cr 1.5-1.6 and eGFR 30's, last eGFR 26. Problem has been following by urologist. Appointment with nephrology will be arranged. Continue losartan  100 mg daily as well as low-salt diet and avoidance of NSAIDs.  Orders: -     Ambulatory referral to Immunology  Anxiety disorder, unspecified type Assessment & Plan: With associated major depressive disorder. Aggravated by the death of her son in 10-16-2023. Currently on sertraline  100 mg daily.  Following with psychiatrist and  psychotherapist.   OSA (obstructive sleep apnea) Assessment & Plan: She is not wearing her CPAP, encouraged doing so. Follow-up with pulmonologist.   COPD mixed type [facility]) Assessment & Plan: Problem is well-controlled. Currently on Breztri  160-9-4 0.8 mcg 2 puff twice daily. Follows with pulmonologist.   Vitamin D  deficiency, unspecified Assessment & Plan: Currently on ergocalciferol  50,000 units weekly.   I spent a total of 41 minutes in both face to face and non face to face activities for this visit on the date of this encounter. During this time history was obtained and documented, examination was performed, prior labs/imaging reviewed, and assessment/plan discussed.  Return if symptoms worsen or fail to improve, for keep next appointment.  I, Fritz Jewel Wierda, acting as a scribe for Rose Koppenhaver [place], MD., have documented all relevant documentation on the behalf of Rose Adcox [place], MD, as directed by  Rose Holsclaw [place], MD while in the presence of Rose Liller [place], MD.   I, Lovelace Cerveny [place], MD, have reviewed all documentation for this visit. The documentation on 01/11/24 for the exam, diagnosis, procedures, and orders are all accurate and complete.  Lydon Vansickle G. [place], MD  [facility]. [place] office.

## 2024-01-11 NOTE — Assessment & Plan Note (Addendum)
 This problem could be contributing to some of her symptoms, including dizziness. I do not think there is an ongoing bacterial/infectious process at this time. She has seen ENT in 01/2021, CT showed clear sinuses in 2022 and in 01/2023 (head CT). Continue Flonase  nasal spray daily at bedtime as needed. Atrovent  nasal spray added today to use twice daily. Continue nasal irrigations as needed throughout the day, Singulair  10 mg daily, and daily Claritin. She agrees with referral to immunologist.

## 2024-01-11 NOTE — Telephone Encounter (Signed)
 FYI Only or Action Required?: FYI only for provider  Patient was last seen in primary care on 11/16/2023 by Francenia Ingle, NP. Called Nurse Triage reporting Dizziness and Nasal Congestion. Symptoms began several days ago. Interventions attempted: Rest, hydration, or home remedies, Meclizine . Symptoms are: unchanged.  Triage Disposition: See PCP When Office is Open (Within 3 Days)  Patient/caregiver understands and will follow disposition?: Yes       Copied from CRM [id number]. Topic: Clinical - Red Word Triage >> Jan 11, 2024 12:49 PM Kita Perish H wrote: [facility] that prompted transfer to Nurse Triage: Dizziness and nasal congestion in head Reason for Disposition  [1] MODERATE dizziness (e.g., interferes with normal activities) AND [2] has been evaluated by doctor (or NP/PA) for this  Answer Assessment - Initial Assessment Questions 1. DESCRIPTION: Describe your dizziness.     Like I'm off balance 2. LIGHTHEADED: Do you feel lightheaded? (e.g., somewhat faint, woozy, weak upon standing)     yes 3. VERTIGO: Do you feel like either you or the room is spinning or tilting? (i.e. vertigo)     No  4. SEVERITY: How bad is it?  Do you feel like you are going to faint? Can you stand and walk?   - MILD: Feels slightly dizzy, but walking normally.   - MODERATE: Feels unsteady when walking, but not falling; interferes with normal activities (e.g., school, work).   - SEVERE: Unable to walk without falling, or requires assistance to walk without falling; feels like passing out now.      Sometimes when I get up from sitting, I have to hold onto something, states she walks with a cane d/t knee problem, states she has to take her time 5. ONSET:  When did the dizziness begin?     Symptoms started Sunday 6. AGGRAVATING FACTORS: Does anything make it worse? (e.g., standing, change in head position)     Standing up  7. HEART RATE: Can you tell me your heart rate? How many  beats in 15 seconds?  (Note: not all patients can do this)       Denies heart palpitations  8. CAUSE: What do you think is causing the dizziness?     Not sure  9. RECURRENT SYMPTOM: Have you had dizziness before? If Yes, ask: When was the last time? What happened that time?     Yes - prescribed meclizine , states it has helped some  10. OTHER SYMPTOMS: Do you have any other symptoms? (e.g., fever, chest pain, vomiting, diarrhea, bleeding)       Nasal congestion, body aches  Denies CP. Denies palpitations. Denies N/V/D. Denies bleeding. Denies fever -- endorses neuropathy, states she stays cold  Mucus is clear  States her son passed 3/19, seeing a therapist and psychiatrist (wondering if dizziness is related to grief)  Protocols used: Dizziness - Lightheadedness-A-AH

## 2024-01-11 NOTE — Assessment & Plan Note (Signed)
Currently on ergocalciferol 50,000 units weekly.

## 2024-01-11 NOTE — Assessment & Plan Note (Addendum)
 Problem has been intermittent since 2022. We reviewed possible etiologies, some of her core mobilities as well as medications could be contributing factors. History and examination do not suggest a serious process. She follows with cardiologist and neurologies for some of her chronic medical conditions, recommend also mentioning this during follow up visits. Very mild dizzy spell with Dix-Hallpike maneuver to the right. ? Vertigo,prefers to hold on vestibular therapy for now. Head CT done in 01/2023 due to headache and dizziness, negative for acute intracranial abnormality.  6 mm fat density mass in the right CP angle likely a small lipoma, no changes when compared with prior images.  Fall precautions to continue. I do not think imaging or blood work are needed at this time. Clearly instructed about warning signs.

## 2024-01-11 NOTE — Patient Instructions (Addendum)
 A few things to remember from today's visit:  Non-seasonal allergic rhinitis, unspecified trigger - Plan: Ambulatory referral to Nephrology  Dizziness  CKD (chronic kidney disease), stage IV (HCC) - Plan: Ambulatory referral to Immunology Fall precautions to continue. Atrovent  nasal spray may help.  No changes today. Keep appt with  neurologist.  If you need refills for medications you take chronically, please call your pharmacy. Do not use My Chart to request refills or for acute issues that need immediate attention. If you send a my chart message, it may take a few days to be addressed, specially if I am not in the office.  Please be sure medication list is accurate. If a new problem present, please set up appointment sooner than planned today.

## 2024-01-11 NOTE — Assessment & Plan Note (Addendum)
 With associated major depressive disorder. Aggravated by the death of her son in 11-10-23. Currently on sertraline  100 mg daily.  Following with psychiatrist and psychotherapist.

## 2024-01-11 NOTE — Assessment & Plan Note (Signed)
 Problem is well-controlled. Currently on Breztri  160-9-4 0.8 mcg 2 puff twice daily. Follows with pulmonologist.

## 2024-01-19 ENCOUNTER — Ambulatory Visit: Admitting: Psychology

## 2024-01-24 ENCOUNTER — Encounter: Payer: Self-pay | Admitting: Diagnostic Neuroimaging

## 2024-01-24 ENCOUNTER — Ambulatory Visit: Admitting: Diagnostic Neuroimaging

## 2024-01-24 VITALS — BP 119/68 | HR 67 | Ht 65.0 in | Wt 223.0 lb

## 2024-01-24 DIAGNOSIS — R269 Unspecified abnormalities of gait and mobility: Secondary | ICD-10-CM | POA: Diagnosis not present

## 2024-01-24 DIAGNOSIS — G6289 Other specified polyneuropathies: Secondary | ICD-10-CM | POA: Diagnosis not present

## 2024-01-24 NOTE — Progress Notes (Signed)
 [facility]  PATIENT: [name] DOB: May 29, 1946  REFERRING CLINICIAN: [place], Rose G, MD  HISTORY FROM: patient  REASON FOR VISIT: follow up   HISTORICAL  CHIEF COMPLAINT:  Chief Complaint  Patient presents with   Headache    Rm 6 with daughter  Pt is well, reports she is still having dizziness about once a month that can last several days. She has a headache about once a month.  She also mentions neuropathy has worsen in her hands and feet. She is having an increase in tingling, burning and itching.     HISTORY OF PRESENT ILLNESS:   UPDATE (01/24/24, VRP): Since last visit, continues with gait diff, lightheadedness, numbness and tingling. Tolerating gabapentin .   UPDATE (07/12/22, VRP): Since last visit, doing about the same. Out of gabapentin  x 1 month, and not much changes. Mild pain issues. Overall stable.    UPDATE (02/16/21, VRP): Since last visit, patient reports onset of headache, dizziness, photophobia and pressure since spring 2021.  She was treated for sinus issues around that time and symptoms improved.  Then symptoms returned a few months later.  Now having almost daily dizzy lightheaded sensations, mild headaches, gait and balance difficulties.  She is under significant stress, tends to worry and feel anxious, sleeping only 4 to 5 hours of sleep at night.  Also recently had home sleep study which shows severe sleep apnea, pending follow-up and treatment.  Patient went to ER for evaluation and testing was unremarkable.  Also went to ENT for evaluation and testing was unremarkable.  UPDATE (11/14/17, VRP): Since last visit, doing worse with pain in left arm and left leg. Tolerating gabapentin  400mg  . No alleviating or aggravating factors.   UPDATE (09/19/17, MM): She states overall she is doing well.  She does have burning and tingling in the left arm that is worse at night.  She also reports she occasionally has burning in the feet.  She states the  compounded cream and gabapentin  has been beneficial.  She did see hand surgery for carpal tunnel but he did not feel that it was severe enough to complete surgery per the patient.  The patient also reports that she is been having neck pain.  She did see her primary care who felt that it was muscle spasms and placed on tizanidine .  She returns today for evaluation.  UPDATE 03/16/17: Since last visit, doing a little better with neuropathy cream and gabapentin . Sxs stable. Planning to get second opinion [facility] rheumatology clinic.    PRIOR HPI (12/01/16): 78 year old female with history of rheumatoid arthritis, here for evaluation of left hand numbness and pain. Patient reports numbness in bilateral hands and feet, especially left hand for past 6 months. She describes pain and tenderness in her left fifth digit radiating up to her left elbow. Patient denies any recent accidents or traumas. Patient has history of diabetes, hypertension, renal cell carcinoma, rheumatoid arthritis, currently on leflunomide . Patient presented for EMG nerve conduction study on 10/14/16 which showed diffuse widespread axonal polyneuropathy as well as superimposed left carpal tunnel syndrome. Patient has history of right carpal tunnel syndrome status post surgery with good results.    REVIEW OF SYSTEMS: Full 14 system review of systems performed and negative with exception of: as per HPI.   ALLERGIES: Allergies  Allergen Reactions   Oxycodone -Acetaminophen  Hives and Itching    hallucinations   Percocet [Oxycodone -Acetaminophen ] Hives, Itching and Other (See Comments)    hallucinations   Pravastatin  Sodium Other (  See Comments)    Muscle aches Muscle aches   Hydrocodone  Other (See Comments)    crazy dreams   Aspirin  Other (See Comments)    GI upset Other reaction(s): Other (See Comments) REACTION: GI upset    HOME MEDICATIONS: Outpatient Medications Prior to Visit  Medication Sig Dispense Refill   acetaminophen   (TYLENOL ) 500 MG tablet Take 1,000 mg by mouth as needed for moderate pain or headache.      Aspirin  81 MG CAPS Take 81 mg by mouth daily.     Budeson-Glycopyrrol-Formoterol  (BREZTRI  AEROSPHERE) 160-9-4.8 MCG/ACT AERO Inhale 2 puffs into the lungs in the morning and at bedtime. 2 each 11   budeson-glycopyrrolate -formoterol  (BREZTRI  AEROSPHERE) 160-9-4.8 MCG/ACT AERO Inhale 2 puffs into the lungs in the morning and at bedtime.     Cholecalciferol (VITAMIN D3) 1.25 MG (50000 UT) CAPS Take 1 capsule (1.25 mg total) by mouth every 7 (seven) days. 12 capsule 0   famotidine  (PEPCID ) 20 MG tablet TAKE 1 TABLET BY MOUTH EVERY MORNING AND 1 TABLET BY MOUTH EVERY DAY AT BEDTIME 180 tablet 2   fluticasone  (FLONASE ) 50 MCG/ACT nasal spray PLACE 2 SPRAYS INTO BOTH NOSTRILS AT BEDTIME AS NEEDED FOR ALLERGIES. 48 mL 1   gabapentin  (NEURONTIN ) 300 MG capsule TAKE 1 CAPSULE BY MOUTH EVERY DAY AT BEDTIME *PATIENT NEEDS APPOINTMENT* 30 capsule 1   hydrochlorothiazide  (HYDRODIURIL ) 25 MG tablet TAKE 1 TABLET BY MOUTH ONCE DAILY 90 tablet 2   ipratropium (ATROVENT ) 0.06 % nasal spray Place 2 sprays into both nostrils 4 (four) times daily. 15 mL 1   losartan  (COZAAR ) 100 MG tablet TAKE 1 TABLET BY MOUTH EVERY MORNING 90 tablet 2   meclizine  (ANTIVERT ) 25 MG tablet Take 1 tablet (25 mg total) by mouth 3 (three) times daily as needed for dizziness. 30 tablet 0   metoprolol  succinate (TOPROL -XL) 100 MG 24 hr tablet TAKE 1 TABLET BY MOUTH AT NOON 90 tablet 2   MYRBETRIQ  50 MG TB24 tablet Take 50 mg by mouth daily.     nitroGLYCERIN  (NITROSTAT ) 0.4 MG SL tablet Place 1 tablet (0.4 mg total) under the tongue every 5 (five) minutes as needed for chest pain. 25 tablet 2   pantoprazole  (PROTONIX ) 40 MG tablet TAKE 1 TABLET BY MOUTH ONCE DAILY 30 tablet 9   rosuvastatin  (CRESTOR ) 10 MG tablet TAKE 1 TABLET BY MOUTH EVERY DAY AT BEDTIME 90 tablet 1   sertraline  (ZOLOFT ) 100 MG tablet Take 100 mg by mouth daily.     chlorhexidine   (PERIDEX ) 0.12 % solution as needed (dental work). (Patient not taking: Reported on 01/24/2024)     Evolocumab  (REPATHA  SURECLICK) 140 MG/ML SOAJ Inject 140 mg into the skin every 14 (fourteen) days. (Patient not taking: Reported on 01/24/2024) 2 mL 11   ibuprofen  (ADVIL ) 600 MG tablet Take 600 mg by mouth every 6 (six) hours as needed. (Patient not taking: Reported on 01/24/2024)     montelukast  (SINGULAIR ) 10 MG tablet TAKE ONE TABLET BY MOUTH EVERYDAY AT BEDTIME (Patient not taking: Reported on 01/24/2024) 30 tablet 3   ondansetron  (ZOFRAN -ODT) 4 MG disintegrating tablet 4mg  ODT q4 hours prn nausea/vomit (Patient not taking: Reported on 01/24/2024) 20 tablet 0   triamcinolone  cream (KENALOG ) 0.1 % Apply 1 Application topically 3 times/day as needed-between meals & bedtime. (Patient not taking: Reported on 01/24/2024) 30 g 1   No facility-administered medications prior to visit.    PAST MEDICAL HISTORY: Past Medical History:  Diagnosis Date   Allergy  Anxiety    C. difficile diarrhea    history of   CAD (coronary artery disease), native coronary artery    a. 05/2017 showed 100% mLCx tx with DES, otherwise 20% mRCA, 50% ramus, 30% dLAD, 20% prox-mid LAD, 20% mLAD treated medically.    Cancer [facility])    Right kidney CA removed.    CKD (chronic kidney disease) stage 3, GFR 30-59 ml/min (HCC) 10/11/2016   s/p R nephrectomy   Colon polyps 2008   HYPERPLASTIC   COPD (chronic obstructive pulmonary disease) (HCC)    former smoker   Depression    Gastritis    GERD (gastroesophageal reflux disease)    History of echocardiogram    Echo 3/18:  Moderate LVH, EF 60-65, grade 1 diastolic dysfunction, calcified aortic valve, mild MR, moderate LAE   History of nuclear stress test    Myoview  3/18: Mod size and intensity fixed septal defect, may be artifact.Opposite mod size and intensity lat defect, which is reversible and could represent ischemia or possibly artifact (SDS 4). LVEF 71% with normal wall  motion. Intermediate risk study. >> images reviewed with Dr. Vina Gull - no sig ischemia; med rx    Hx of cardiovascular stress test    Lexiscan  Myoview  6/16:  EF 70%, no scar or ischemia; Low Risk   Hyperlipidemia    Hypertension    Myocardial infarction (HCC)    2018   Neuropathy    Orthostatic hypotension 05/28/2017   OSA (obstructive sleep apnea) 01/16/2021   Osteoarthritis    Pre-diabetes    no meds   Rheumatoid arthritis (HCC)    RA (Dr. Lenon) Bilateral hands   S/P angioplasty with stent 05/27/17 to LCX with DES  05/28/2017   Sleep apnea 2023   does not use CPAP   Thyroid  disease     PAST SURGICAL HISTORY: Past Surgical History:  Procedure Laterality Date   ABDOMINAL HYSTERECTOMY  1978   ANTERIOR CERVICAL DECOMP/DISCECTOMY FUSION N/A 05/29/2018   Procedure: ANTERIOR CERVICAL DECOMPRESSION FUSION - CERVICAL FIVE-CERVICAL SIX - CERVICAL SIX-CERVICAL SEVEN;  Surgeon: Louis Shove, MD;  Location: [facility] OR;  Service: Neurosurgery;  Laterality: N/A;   BACK SURGERY     x 2   BREAST LUMPECTOMY WITH RADIOACTIVE SEED LOCALIZATION Right 04/13/2022   Procedure: RIGHT BREAST SEED LUMPECTOMY;  Surgeon: Vanderbilt Ned, MD;  Location: [facility];  Service: General;  Laterality: Right;   CARPAL TUNNEL RELEASE Left    COLONOSCOPY     COLONOSCOPY W/ BIOPSIES AND POLYPECTOMY     Hx: of   CORONARY STENT INTERVENTION N/A 05/27/2017   Procedure: CORONARY STENT INTERVENTION;  Surgeon: Verlin Lonni BIRCH, MD;  Location: [facility] INVASIVE CV LAB;  Service: Cardiovascular;  Laterality: N/A;   ESOPHAGOGASTRODUODENOSCOPY     HNP     LEFT HEART CATH AND CORONARY ANGIOGRAPHY N/A 05/27/2017   Procedure: LEFT HEART CATH AND CORONARY ANGIOGRAPHY;  Surgeon: Verlin Lonni BIRCH, MD;  Location: [facility]VAS[facility] CV [facility];  Service: Cardiovascular;  Laterality: N/A;   LUMBAR LAMINECTOMY/DECOMPRESSION MICRODISCECTOMY Left 02/12/2013   Procedure: LUMBAR TWO THREE, LUMBAR THREE FOUR, LUMBAR FOUR  FIVE  LAMINECTOMY/DECOMPRESSION MICRODISCECTOMY 3 LEVELS;  Surgeon: Shove DELENA Louis, MD;  Location: [facility];  Service: Neurosurgery;  Laterality: Left;   NEPHRECTOMY Right 2010   10.rcc cancer   TOTAL KNEE ARTHROPLASTY Right    Redo   UPPER GASTROINTESTINAL ENDOSCOPY      FAMILY HISTORY: Family History  Problem Relation Age of Onset   Rheum  arthritis Mother    Stroke Mother    Prostate cancer Father    Heart disease Father    Diabetes Brother    Dementia Brother    Parkinsonism Brother    Diabetes Brother    Cancer Brother    Dementia Brother    Kidney disease Son    Bipolar disorder Daughter    Colon cancer Neg Hx    Esophageal cancer Neg Hx    Pancreatic cancer Neg Hx    Liver disease Neg Hx    Rectal cancer Neg Hx    Stomach cancer Neg Hx     SOCIAL HISTORY:  Social History   Socioeconomic History   Marital status: Divorced    Spouse name: Not on file   Number of children: 3   Years of education: 12   Highest education level: Not on file  Occupational History   Occupation: Retired    Associate Professor: RETIRED  Tobacco Use   Smoking status: Former    Current packs/day: 0.00    Average packs/day: 0.2 packs/day for 53.1 years (10.6 ttl pk-yrs)    Types: Cigarettes    Start date: 46    Quit date: 09/07/2021    Years since quitting: 2.3   Smokeless tobacco: Never   Tobacco comments:    pt has been an on and off smoker since age 80  Vaping Use   Vaping status: Never Used  Substance and Sexual Activity   Alcohol use: No    Alcohol/week: 0.0 standard drinks of alcohol   Drug use: No   Sexual activity: Not Currently    Birth control/protection: Surgical    Comment: hyst  Other Topics Concern   Not on file  Social History Narrative   2025- dgtr lives with her   Alcohol use- no   Drug use-no   R handed    Social Drivers of Health   Financial Resource Strain: Low Risk  (09/08/2022)   Overall Financial Resource Strain (CARDIA)    Difficulty of Paying Living  Expenses: Not hard at all  Food Insecurity: No Food Insecurity (10/20/2022)   Hunger Vital Sign    Worried About Running Out of Food in the Last Year: Never true    Ran Out of Food in the Last Year: Never true  Transportation Needs: No Transportation Needs (09/08/2022)   PRAPARE - Administrator, Civil Service (Medical): No    Lack of Transportation (Non-Medical): No  Physical Activity: Inactive (09/08/2022)   Exercise Vital Sign    Days of Exercise per Week: 0 days    Minutes of Exercise per Session: 0 min  Stress: Stress Concern Present (09/08/2022)   [employer] of Occupational Health - Occupational Stress Questionnaire    Feeling of Stress : To some extent  Social Connections: Moderately Integrated (09/08/2022)   Social Connection and Isolation Panel    Frequency of Communication with Friends and Family: More than three times a week    Frequency of Social Gatherings with Friends and Family: More than three times a week    Attends Religious Services: More than 4 times per year    Active Member of [employer] or Organizations: Yes    Attends Banker Meetings: More than 4 times per year    Marital Status: Divorced  Intimate Partner Violence: Not At Risk (09/08/2022)   Humiliation, Afraid, Rape, and Kick questionnaire    Fear of Current or Ex-Partner: No    Emotionally Abused: No  Physically Abused: No    Sexually Abused: No     PHYSICAL EXAM  GENERAL EXAM/CONSTITUTIONAL: Vitals:  Vitals:   01/24/24 1541  BP: 119/68  Pulse: 67  Weight: 223 lb (101.2 kg)  Height: 5' 5 (1.651 m)    Body mass index is 37.11 kg/m. No results found. Patient is in no distress; well developed, nourished and groomed; neck is supple  CARDIOVASCULAR: Examination of carotid arteries is normal; no carotid bruits Regular rate and rhythm, no murmurs Examination of peripheral vascular system by observation and palpation is normal  EYES: Ophthalmoscopic exam of optic discs and  posterior segments is normal; no papilledema or hemorrhages  MUSCULOSKELETAL: Gait, strength, tone, movements noted in Neurologic exam below  NEUROLOGIC: MENTAL STATUS:      No data to display         awake, alert, oriented to person, place and time recent and remote memory intact normal attention and concentration language fluent, comprehension intact, naming intact,  fund of knowledge appropriate  CRANIAL NERVE:  2nd - no papilledema on fundoscopic exam 2nd, 3rd, 4th, 6th - pupils equal and reactive to light, visual fields full to confrontation, extraocular muscles intact, no nystagmus 5th - facial sensation symmetric 7th - facial strength symmetric 8th - hearing intact 9th - palate elevates symmetrically, uvula midline 11th - shoulder shrug symmetric 12th - tongue protrusion midline  MOTOR:  normal bulk and tone, full strength in the BUE, BLE  SENSORY:  normal and symmetric to light touch, temperature, vibration DECR PP IN BILATERAL HANDS  COORDINATION:  finger-nose-finger, fine finger movements normal  REFLEXES:  deep tendon reflexes TRACE and symmetric  GAIT/STATION:  narrow based gait   DIAGNOSTIC DATA (LABS, IMAGING, TESTING) - I reviewed patient records, labs, notes, testing and imaging myself where available.  Lab Results  Component Value Date   WBC 7.0 11/16/2023   HGB 14.4 11/16/2023   HCT 43.6 11/16/2023   MCV 88.9 11/16/2023   PLT 250.0 11/16/2023      Component Value Date/Time   NA 140 11/16/2023 1109   NA 139 07/31/2019 1418   K 3.6 11/16/2023 1109   CL 103 11/16/2023 1109   CO2 27 11/16/2023 1109   GLUCOSE 98 11/16/2023 1109   BUN 25 (H) 11/16/2023 1109   BUN 20 07/31/2019 1418   CREATININE 1.78 (H) 11/16/2023 1109   CREATININE 1.47 (H) 07/07/2020 1104   CALCIUM  9.8 11/16/2023 1109   PROT 7.8 11/16/2023 1109   PROT 6.9 11/10/2020 0926   ALBUMIN  4.0 11/16/2023 1109   ALBUMIN  4.3 09/11/2020 1227   AST 24 11/16/2023 1109   ALT 13  11/16/2023 1109   ALKPHOS 62 11/16/2023 1109   BILITOT 0.5 11/16/2023 1109   BILITOT 0.4 09/11/2020 1227   GFRNONAA 31 (L) 03/24/2023 1937   GFRNONAA 34 (L) 07/07/2020 1104   GFRAA 40 (L) 07/07/2020 1104   Lab Results  Component Value Date   CHOL 143 09/11/2020   HDL 56 09/11/2020   LDLCALC 69 09/11/2020   LDLDIRECT 171.5 12/06/2011   TRIG 98 09/11/2020   CHOLHDL 2.6 09/11/2020   Lab Results  Component Value Date   HGBA1C 6.1 (A) 04/05/2023   Lab Results  Component Value Date   VITAMINB12 270 11/16/2023   Lab Results  Component Value Date   TSH 2.81 11/16/2023    10/14/16 EMG/NCS 1. 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 2. Superimposed left median neuropathy at the wrist consistent with left carpal tunnel  syndrome.   01/25/21 CT head - No acute intracranial pathology.    ASSESSMENT AND PLAN  78 y.o. year old female here with rheumatoid arthritis, numbness and tingling in the hands and feet, especially with tenderness and pain in the left hand and left forearm. Left hand and forearm symptoms may be related to combination of arthritis, peripheral neuropathy and left carpal tunnel syndrome.    Dx left hand pain: rheumatoid arthritis + axonal peripheral neuropathy (due to rheumatoid arthritis or DMT or other causes) + left carpal tunnel syndrome  1. Axonal neuropathy   2. Gait difficulty      PLAN:  DIZZINESS / LIGHTHEADEDNESS / OFF BALANCE - start log to record symptoms, BP, HR, medication - consider PT evaluation / exercises - use cane / walker  NEUROPATHY (due to rheumatoid arthritis or DMT or other cause) - gabapentin  300mg  at bedtime for pain; may try to wean off if not needed  ARTHRITIS PAIN (? seroneg RA vs crystalline arthropathy or other problem) - follow up with rheumatology  Return for return to PCP, pending if symptoms worsen or fail to improve.    EDUARD FABIENE HANLON, MD 01/24/2024, 4:02  PM Certified in Neurology, Neurophysiology and Neuroimaging  [facility] [address] [place], [place] [address] [phone]

## 2024-01-24 NOTE — Patient Instructions (Addendum)
 DIZZINESS / LIGHTHEADEDNESS / OFF BALANCE - start log to record symptoms, BP, HR, medication - consider PT evaluation / exercises - use cane / walker  NEUROPATHY (due to rheumatoid arthritis or DMT or other cause) - gabapentin  300mg  at bedtime for pain; may try to wean off if not needed  ARTHRITIS PAIN (? seroneg RA vs crystalline arthropathy or other problem) - follow up with rheumatology

## 2024-02-02 DIAGNOSIS — E1122 Type 2 diabetes mellitus with diabetic chronic kidney disease: Secondary | ICD-10-CM | POA: Diagnosis not present

## 2024-02-02 DIAGNOSIS — N189 Chronic kidney disease, unspecified: Secondary | ICD-10-CM | POA: Diagnosis not present

## 2024-02-02 DIAGNOSIS — N184 Chronic kidney disease, stage 4 (severe): Secondary | ICD-10-CM | POA: Diagnosis not present

## 2024-02-02 DIAGNOSIS — Z905 Acquired absence of kidney: Secondary | ICD-10-CM | POA: Diagnosis not present

## 2024-02-02 DIAGNOSIS — D631 Anemia in chronic kidney disease: Secondary | ICD-10-CM | POA: Diagnosis not present

## 2024-02-02 DIAGNOSIS — I129 Hypertensive chronic kidney disease with stage 1 through stage 4 chronic kidney disease, or unspecified chronic kidney disease: Secondary | ICD-10-CM | POA: Diagnosis not present

## 2024-02-02 DIAGNOSIS — C641 Malignant neoplasm of right kidney, except renal pelvis: Secondary | ICD-10-CM | POA: Diagnosis not present

## 2024-02-02 DIAGNOSIS — N2581 Secondary hyperparathyroidism of renal origin: Secondary | ICD-10-CM | POA: Diagnosis not present

## 2024-02-14 DIAGNOSIS — F4320 Adjustment disorder, unspecified: Secondary | ICD-10-CM | POA: Diagnosis not present

## 2024-02-14 DIAGNOSIS — F331 Major depressive disorder, recurrent, moderate: Secondary | ICD-10-CM | POA: Diagnosis not present

## 2024-02-15 ENCOUNTER — Telehealth: Payer: Self-pay | Admitting: Family Medicine

## 2024-02-15 DIAGNOSIS — M1990 Unspecified osteoarthritis, unspecified site: Secondary | ICD-10-CM

## 2024-02-15 NOTE — Telephone Encounter (Signed)
 Copied from CRM [id number]. Topic: Referral - Request for Referral >> Feb 15, 2024  4:43 PM Kevelyn M wrote: Did the patient discuss referral with their provider in the last year? Yes (If No - schedule appointment) (If Yes - send message)   Type of order/referral and detailed reason for visit: Rheumatologist referral per Neurologist  Preference of office, provider, location: [facility]  If referral order, have you been seen by this specialty before? No (If Yes, this issue or another issue? When? Where?  Can we respond through MyChart? Yes

## 2024-02-15 NOTE — Telephone Encounter (Signed)
 Referral placed.

## 2024-02-16 DIAGNOSIS — M19012 Primary osteoarthritis, left shoulder: Secondary | ICD-10-CM | POA: Diagnosis not present

## 2024-02-29 ENCOUNTER — Other Ambulatory Visit: Payer: Self-pay | Admitting: Family Medicine

## 2024-02-29 ENCOUNTER — Ambulatory Visit: Admitting: Psychology

## 2024-02-29 DIAGNOSIS — I119 Hypertensive heart disease without heart failure: Secondary | ICD-10-CM

## 2024-02-29 DIAGNOSIS — F331 Major depressive disorder, recurrent, moderate: Secondary | ICD-10-CM

## 2024-02-29 NOTE — Progress Notes (Signed)
 [facility] Counselor/Therapist Progress Note  Patient ID: [name], MRN: [id number],    Date: 02/29/2024  Time Spent: 1:30pm-2:33pm  [name] is seen for an in person visit.     Treatment Type: Individual Therapy  Reported Symptoms: [name] reports sadness and not wanting to engage about half the time.   Other days positive.  Mental Status Exam: Appearance:  Well Groomed     Behavior: Appropriate  Motor: Normal  Speech/Language:  Normal Rate  Affect: Appropriate  Mood: sad  Thought process: normal  Thought content:   WNL  Sensory/Perceptual disturbances:   WNL  Orientation: oriented to person, place, time/date, and situation  Attention: Good  Concentration: Good  Memory: WNL  Fund of knowledge:  Good  Insight:   Good  Judgment:  Good  Impulse Control: Good   Risk Assessment: Danger to Self:  No Self-injurious Behavior: No Danger to Others: No Duty to Warn:no Physical Aggression / Violence:No  Access to Firearms a concern: No  Gang Involvement:No   Subjective: Counselor assessed [name] current functioning per [name] report. Processed w/[name] sadness and grief.  Validated and normalized [name] grief and process.  Assisted w/ identifying distortions related to guilt/blame and reframing.  Explored stressors w/ son's estate and w/ her own medical stressors.  Encouraged continued engagement w/ positive interactions and supports.    [name] affect wnl.  [name] reports she has some down days and not wanting to engage in things.  [name] reports other days positive and mood less depressed.  [name] reports she has stopped Lexapro as causing itching and restarted on zoloft .  [name] reports that she has been following up w/ providers for allergies, dizziness, kidney functioning and her arthritis.  [name] also discussed stress w/ son's estate and trying to settled.  [name] reflects on her son, ways feels connected.  [name] discussed some selfblame thoughts and able to challenge and reframe.   Interventions:  supportive  Diagnosis:Major depressive disorder, recurrent episode, moderate (HCC)  Plan: [name] to f/u in 3-4 weeks for counseling.  [name] to f/u as scheduled w/ PCP and psychiatrist as scheduled.      Individualized Treatment Plan Strengths: enjoys time w/ daughter, watching her t.v shows.  Baking/cooking at times.    Supports: her daughter, her cousin   Goal/Needs for Treatment:  In order of importance to patient 1) continue engagement in activities/ talk w/ friends 2) decreased depressed moods 3) work through Therapist, art of Needs: continue to have support to cope w/ depression and grieve     Treatment Level:out [name] counseling  Symptoms:depressed mood, tearfulness, loss of interest, difficulty w/ motivation, withdrawn from interacting.   Client Treatment Preferences:biweekly to monthly in person counseling.  Continue medication management w/ psychiatrist.    Healthcare consumer's goal for treatment:  Counselor, [name], [facility] will support the patient's ability to achieve the goals identified. Cognitive Behavioral Therapy, Assertive Communication/Conflict Resolution Training, Relaxation Training, ACT, Humanistic and other evidenced-based practices will be used to promote progress towards healthy functioning.   Healthcare consumer will: Actively participate in therapy, working towards healthy functioning.    *Justification for Continuation/Discontinuation of Goal: R=Revised, O=Ongoing, A=Achieved, D=Discontinued  Goal 1)  Increased engagement w/ activities she enjoys and w/ social interactions AEB [name] report and counselor observation. Baseline date 12/29/23: Progress towards goal 50; How Often - Daily Target Date Goal Was reviewed Status Code Progress towards goal/Likert rating  12/28/24  Goal 2) Increased coping w/ losses and w/ depressed moods AEB utilizing coping skills daily.   Baseline date 12/29/23: Progress towards goal 0; How Often - Daily Target  Date Goal Was reviewed Status Code Progress towards goal  12/28/24                  This plan has been reviewed and created by the following participants:  This plan will be reviewed at least every 12 months. Date Behavioral Health Clinician Date Guardian/Patient   12/29/23  [facility] Barbarann [facility] 12/29/23 Verbal Consent Provided and electronic signature requested                        [name] LCMHC

## 2024-03-01 ENCOUNTER — Ambulatory Visit: Admitting: Psychology

## 2024-03-05 ENCOUNTER — Telehealth: Payer: Self-pay | Admitting: Internal Medicine

## 2024-03-05 NOTE — Telephone Encounter (Signed)
 PT dropped off paperwork for Dr.Young. Paperwork has been placed in Mailbox. PT also asked if she can get a medication sample to get her to her refill APT. Please advise.

## 2024-03-07 DIAGNOSIS — N184 Chronic kidney disease, stage 4 (severe): Secondary | ICD-10-CM | POA: Diagnosis not present

## 2024-03-07 MED ORDER — BREZTRI AEROSPHERE 160-9-4.8 MCG/ACT IN AERO: 2.0000 | INHALATION_SPRAY | Freq: Two times a day (BID) | RESPIRATORY_TRACT | Status: DC

## 2024-03-07 NOTE — Telephone Encounter (Signed)
 Dr. Neysa, can we give patient a sample of Breztri ?  Thank you.

## 2024-03-08 NOTE — Telephone Encounter (Signed)
 Per Dr. Neysa, okay to give patient sample of Breztri .  Left sample at front desk for patient to pick up.

## 2024-03-13 DIAGNOSIS — Z1231 Encounter for screening mammogram for malignant neoplasm of breast: Secondary | ICD-10-CM | POA: Diagnosis not present

## 2024-03-13 LAB — HM MAMMOGRAPHY

## 2024-03-15 ENCOUNTER — Ambulatory Visit (INDEPENDENT_AMBULATORY_CARE_PROVIDER_SITE_OTHER): Admitting: Family Medicine

## 2024-03-15 ENCOUNTER — Encounter: Payer: Self-pay | Admitting: Family Medicine

## 2024-03-15 DIAGNOSIS — M25562 Pain in left knee: Secondary | ICD-10-CM | POA: Diagnosis not present

## 2024-03-15 DIAGNOSIS — Z0000 Encounter for general adult medical examination without abnormal findings: Secondary | ICD-10-CM | POA: Diagnosis not present

## 2024-03-15 DIAGNOSIS — M19012 Primary osteoarthritis, left shoulder: Secondary | ICD-10-CM | POA: Diagnosis not present

## 2024-03-15 NOTE — Patient Instructions (Signed)
 I really enjoyed getting to talk with you today! I am available on Tuesdays and Thursdays for virtual visits if you have any questions or concerns, or if I can be of any further assistance.   CHECKLIST FROM ANNUAL WELLNESS VISIT:  -Follow up (please call to schedule if not scheduled after visit):   -yearly for annual wellness visit with primary care office  Here is a list of your preventive care/health maintenance measures and the plan for each if any are due:  PLAN For any measures below that may be due:    1. Please call to schedule follow up with Dr. Dickey for diabetes, flu shot, foot exam in office in September.   2. Can get the other vaccines at the pharmacy - if you do - please let us  know so that we can update your record.   Health Maintenance  Topic Date Due   Zoster Vaccines- Shingrix (1 of 2) 01/22/1965   Diabetic kidney evaluation - Urine ACR  03/26/2021   DTaP/Tdap/Td (3 - Td or Tdap) 12/15/2021   OPHTHALMOLOGY EXAM  04/28/2022   COVID-19 Vaccine (4 - 2024-25 season) 04/03/2023   HEMOGLOBIN A1C  10/03/2023   FOOT EXAM  11/17/2023   INFLUENZA VACCINE  03/02/2024   Diabetic kidney evaluation - eGFR measurement  11/15/2024   MAMMOGRAM  03/13/2025   Medicare Annual Wellness (AWV)  03/15/2025   Pneumococcal Vaccine: 50+ Years  Completed   DEXA SCAN  Completed   HPV VACCINES  Aged Out   Meningococcal B Vaccine  Aged Out   Pneumococcal Vaccine  Discontinued   Colonoscopy  Discontinued   Hepatitis C Screening  Discontinued    -See a dentist at least yearly  -Get your eyes checked and then per your eye specialist's recommendations  -Other issues addressed today:   -I have included below further information regarding a healthy whole foods based diet, physical activity guidelines for adults, stress management and opportunities for social connections. I hope you find this information useful.    -----------------------------------------------------------------------------------------------------------------------------------------------------------------------------------------------------------------------------------------------------------    NUTRITION: -eat real food: lots of colorful vegetables (half the plate) and fruits -5-7 servings of vegetables and fruits per day (fresh or steamed is best), exp. 2 servings of vegetables with lunch and dinner and 2 servings of fruit per day. Berries and greens such as kale and collards are great choices.  -consume on a regular basis:  fresh fruits, fresh veggies, fish, nuts, seeds, healthy oils (such as olive oil, avocado oil), whole grains (make sure for bread/pasta/crackers/etc., that the first ingredient on label contains the word whole), legumes. -can eat small amounts of dairy and lean meat (no larger than the palm of your hand), but avoid processed meats such as ham, bacon, lunch meat, etc. -drink water  -try to avoid fast food and pre-packaged foods, processed meat, ultra processed foods/beverages (donuts, candy, etc.) -most experts advise limiting sodium to < 2300mg  per day, should limit further is any chronic conditions such as high blood pressure, heart disease, diabetes, etc. The American Heart Association advised that < 1500mg  is is ideal -try to avoid foods/beverages that contain any ingredients with names you do not recognize  -try to avoid foods/beverages  with added sugar or sweeteners/sweets  -try to avoid sweet drinks (including diet drinks): soda, juice, Gatorade, sweet tea, power drinks, diet drinks -try to avoid white rice, white bread, pasta (unless whole grain)  EXERCISE GUIDELINES FOR ADULTS: -if you wish to increase your physical activity, do so gradually and with the approval  of your doctor -STOP and seek medical care immediately if you have any chest pain, chest discomfort or trouble breathing when starting or  increasing exercise  -move and stretch your body, legs, feet and arms when sitting for long periods -Physical activity guidelines for optimal health in adults: -get at least 150 minutes per week of moderate exercise (can talk, but not sing); this is about 20-30 minutes of sustained activity 5-7 days per week or two 10-15 minute episodes of sustained activity 5-7 days per week -do some muscle building/resistance training/strength training at least 2 days per week  -balance exercises 3+ days per week:   Stand somewhere where you have something sturdy to hold onto if you lose balance    1) lift up on toes, then back down, start with 5x per day and work up to 20x   2) stand and lift one leg straight out to the side so that foot is a few inches of the floor, start with 5x each side and work up to 20x each side   3) stand on one foot, start with 5 seconds each side and work up to 20 seconds on each side  If you need ideas or help with getting more active:  -Silver  sneakers https://tools.silversneakers.com  -Walk with a Doc: http://www.duncan-[email or web address]/  -try to include resistance (weight lifting/strength building) and balance exercises twice per week: or the following link for ideas: http://castillo-powell.com/  BuyDucts.dk  STRESS MANAGEMENT: -can try meditating, or just sitting quietly with deep breathing while intentionally relaxing all parts of your body for 5 minutes daily -if you need further help with stress, anxiety or depression please follow up with your primary doctor or contact the wonderful folks at [facility]: [phone]  SOCIAL CONNECTIONS: -options in [place] if you wish to engage in more social and exercise related activities:  -Silver  sneakers https://tools.silversneakers.com  -Walk with a Doc: http://www.duncan-[email or web address]/  -Check out the [facility] Active Adults 50+  section on the [place] of [employer] (hiking clubs, book clubs, cards and games, chess, exercise classes, aquatic classes and much more) - see the website for details: https://www.[place].gov/departments/parks-recreation/active-adults50  -YouTube has lots of exercise videos for different ages and abilities as well  -[facility] (a variety of indoor and outdoor inperson activities for adults). [phone]. [address].  -Virtual Online Classes (a variety of topics): see seniorplanet.org or call [phone]  -consider volunteering at a school, hospice center, church, senior center or elsewhere

## 2024-03-15 NOTE — Progress Notes (Signed)
 PATIENT CHECK-IN and HEALTH RISK ASSESSMENT QUESTIONNAIRE:  -completed by phone/video for upcoming Medicare Preventive Visit  Pre-Visit Check-in: 1)Vitals (height, wt, BP, etc) - record in vitals section for visit on day of visit Request home vitals (wt, BP, etc.) and enter into vitals, THEN update Vital Signs SmartPhrase below at the top of the HPI. See below.  2)Review and Update Medications, Allergies PMH, Surgeries, Social history in Epic 3)Hospitalizations in the last year with date/reason? Once last summer for dizziness  4)Review and Update Care Team (patient's specialists) in Epic 5) Complete PHQ9 in Epic  6) Complete Fall Screening in Epic 7)Review all Health Maintenance Due and order if not done.  Medicare Wellness Patient Questionnaire:  Answer theses question about your habits: How often do you have a drink containing alcohol?n How many drinks containing alcohol do you have on a typical day when you are drinking?na How often do you have six or more drinks on one occasion?na Have you ever smoked?y Quit date if applicable? Quit 2 years ago  How many packs a day do/did you smoke?  Used to smoke about 1/2 ppd, for 10 years Do you use smokeless tobacco?n Do you use an illicit drugs?n On average, how many days per week do you engage in moderate to strenuous exercise (like a brisk walk)?some, was doing water  aerobics, not doing any now - having knee issues and seeing orthopedics today for this.  On average, how many minutes do you engage in exercise at this level?na Typical breakfast:cereal or a muffin, sometimes bacon and egg Typical lunch/dinner: salads, chicken, fish - she reports she doesn't eat much red meat, and she loves vegetables Typical snacks: sometimes snacks - no much, sometimes an oatmeal cookie or chips, sometimes fruit  Beverages: tries to drink a lot of water  Daughter lives with her  Answer theses question about your everyday activities: Can you perform most  household chores?y Are you deaf or have significant trouble hearing?doing ok, has had evaluation, a little hearing loss on one side - goes yearly for follow up Do you feel that you have a problem with memory?not really - occ forgets a few things  Do you feel safe at home?y Last dentist visit?goes on a regular basis 8. Do you have any difficulty performing your everyday activities?n Are you having any difficulty walking, taking medications on your own, and or difficulty managing daily home needs?n Do you have difficulty walking or climbing stairs?n Do you have difficulty dressing or bathing?n Do you have difficulty doing errands alone such as visiting a doctor's office or shopping?n Do you currently have any difficulty preparing food and eating?n Do you currently have any difficulty using the toilet?n Do you have any difficulty managing your finances?n Do you have any difficulties with housekeeping of managing your housekeeping?n   Do you have Advanced Directives in place (Living Will, Healthcare Power or Attorney)? y   Last eye Exam and location? Reports had exam in May - can't remember name as is a new doctor   Do you currently use prescribed or non-prescribed narcotic or opioid pain medications?no  Do you have a history or close family history of breast, ovarian, tubal or peritoneal cancer or a family member with BRCA (breast cancer susceptibility 1 and 2) gene mutations? See PMH/FH    ----------------------------------------------------------------------------------------------------------------------------------------------------------------------------------------------------------------------  Because this visit was a virtual/telehealth visit, some criteria may be missing or patient reported. Any vitals not documented were not able to be obtained and vitals that have been documented are  patient reported.    MEDICARE ANNUAL PREVENTIVE VISIT WITH PROVIDER: (Welcome to Medicare,  initial annual wellness or annual wellness exam)  Virtual Visit via Phone Note  I connected with [name] on 03/15/24 by phone and verified that I am speaking with the correct person using two identifiers. She prefers a phone visit.  Location patient: home Location provider:work or home office Persons participating in the virtual visit: patient, provider  Concerns and/or follow up today: stable, not much new. She lost her son in March so has been dealing with feeling down from that. Is working thru it - sees [employer] for this. She also is getting counseling through the hospice center which she feels has been very helpful.   See HM section in Epic for other details of completed HM.    ROS: negative for report of fevers, unintentional weight loss, vision changes, vision loss, hearing loss or change, chest pain, sob, hemoptysis, melena, hematochezia, hematuria, falls, bleeding or bruising, thoughts of suicide or self harm, memory loss  Patient-completed extensive health risk assessment - reviewed and discussed with the patient: See Health Risk Assessment completed with patient prior to the visit either above or in recent phone note. This was reviewed in detailed with the patient today and appropriate recommendations, orders and referrals were placed as needed per Summary below and patient instructions.   Review of Medical History: -PMH, PSH, Family History and current specialty and care providers reviewed and updated and listed below   Patient Care Team: [place], [name], MD as PCP - General (Family Medicine) Okey Vina GAILS, MD as PCP - Cardiology (Cardiology) Lionell, Jon DEL, [facility] (Pharmacist) Neysa Reggy BIRCH, MD as Consulting Physician (Pulmonary Disease) Doles-Johnson, Teah, NP as Nurse Practitioner (Adult Health Nurse Practitioner) Rosalind Zachary BRAVO, MD as Referring Physician (Specialist)   Past Medical History:  Diagnosis Date   Allergy    Anxiety    C. difficile  diarrhea    history of   CAD (coronary artery disease), native coronary artery    a. 05/2017 showed 100% mLCx tx with DES, otherwise 20% mRCA, 50% ramus, 30% dLAD, 20% prox-mid LAD, 20% mLAD treated medically.    Cancer [facility])    Right kidney CA removed.    CKD (chronic kidney disease) stage 3, GFR 30-59 ml/min (HCC) 10/11/2016   s/p R nephrectomy   Colon polyps 2008   HYPERPLASTIC   COPD (chronic obstructive pulmonary disease) (HCC)    former smoker   Depression    Gastritis    GERD (gastroesophageal reflux disease)    History of echocardiogram    Echo 3/18:  Moderate LVH, EF 60-65, grade 1 diastolic dysfunction, calcified aortic valve, mild MR, moderate LAE   History of nuclear stress test    Myoview  3/18: Mod size and intensity fixed septal defect, may be artifact.Opposite mod size and intensity lat defect, which is reversible and could represent ischemia or possibly artifact (SDS 4). LVEF 71% with normal wall motion. Intermediate risk study. >> images reviewed with Dr. Vina Okey - no sig ischemia; med rx    Hx of cardiovascular stress test    Lexiscan  Myoview  6/16:  EF 70%, no scar or ischemia; Low Risk   Hyperlipidemia    Hypertension    Myocardial infarction (HCC)    2018   Neuropathy    Orthostatic hypotension 05/28/2017   OSA (obstructive sleep apnea) 01/16/2021   Osteoarthritis    Pre-diabetes    no meds   Rheumatoid arthritis (HCC)  RA (Dr. Lenon) Bilateral hands   S/P angioplasty with stent 05/27/17 to LCX with DES  05/28/2017   Sleep apnea 2023   does not use CPAP   Thyroid  disease     Past Surgical History:  Procedure Laterality Date   ABDOMINAL HYSTERECTOMY  1978   ANTERIOR CERVICAL DECOMP/DISCECTOMY FUSION N/A 05/29/2018   Procedure: ANTERIOR CERVICAL DECOMPRESSION FUSION - CERVICAL FIVE-CERVICAL SIX - CERVICAL SIX-CERVICAL SEVEN;  Surgeon: Louis Shove, MD;  Location: [facility] OR;  Service: Neurosurgery;  Laterality: N/A;   BACK SURGERY     x 2   BREAST  LUMPECTOMY WITH RADIOACTIVE SEED LOCALIZATION Right 04/13/2022   Procedure: RIGHT BREAST SEED LUMPECTOMY;  Surgeon: Vanderbilt Ned, MD;  Location: [facility];  Service: General;  Laterality: Right;   CARPAL TUNNEL RELEASE Left    COLONOSCOPY     COLONOSCOPY W/ BIOPSIES AND POLYPECTOMY     Hx: of   CORONARY STENT INTERVENTION N/A 05/27/2017   Procedure: CORONARY STENT INTERVENTION;  Surgeon: Verlin Lonni BIRCH, MD;  Location: [facility] INVASIVE CV LAB;  Service: Cardiovascular;  Laterality: N/A;   ESOPHAGOGASTRODUODENOSCOPY     HNP     LEFT HEART CATH AND CORONARY ANGIOGRAPHY N/A 05/27/2017   Procedure: LEFT HEART CATH AND CORONARY ANGIOGRAPHY;  Surgeon: Verlin Lonni BIRCH, MD;  Location: [facility];  Service: Cardiovascular;  Laterality: N/A;   LUMBAR LAMINECTOMY/DECOMPRESSION MICRODISCECTOMY Left 02/12/2013   Procedure: LUMBAR TWO THREE, LUMBAR THREE FOUR, LUMBAR FOUR FIVE  LAMINECTOMY/DECOMPRESSION MICRODISCECTOMY 3 LEVELS;  Surgeon: Shove DELENA Louis, MD;  Location: [facility] NEURO ORS;  Service: Neurosurgery;  Laterality: Left;   NEPHRECTOMY Right 2010   10.rcc cancer   TOTAL KNEE ARTHROPLASTY Right    Redo   UPPER GASTROINTESTINAL ENDOSCOPY      Social History   Socioeconomic History   Marital status: Divorced    Spouse name: Not on file   Number of children: 3   Years of education: 12   Highest education level: Not on file  Occupational History   Occupation: Retired    Associate Professor: RETIRED  Tobacco Use   Smoking status: Former    Current packs/day: 0.00    Average packs/day: 0.2 packs/day for 53.1 years (10.6 ttl pk-yrs)    Types: Cigarettes    Start date: 30    Quit date: 09/07/2021    Years since quitting: 2.5   Smokeless tobacco: Never   Tobacco comments:    pt has been an on and off smoker since age 26  Vaping Use   Vaping status: Never Used  Substance and Sexual Activity   Alcohol use: No    Alcohol/week: 0.0 standard drinks of alcohol   Drug  use: No   Sexual activity: Not Currently    Birth control/protection: Surgical    Comment: hyst  Other Topics Concern   Not on file  Social History Narrative   2025- dgtr lives with her   Alcohol use- no   Drug use-no   R handed    Social Drivers of Health   Financial Resource Strain: Low Risk  (09/08/2022)   Overall Financial Resource Strain (CARDIA)    Difficulty of Paying Living Expenses: Not hard at all  Food Insecurity: No Food Insecurity (10/20/2022)   Hunger Vital Sign    Worried About Running Out of Food in the Last Year: Never true    Ran Out of Food in the Last Year: Never true  Transportation Needs: No Transportation Needs (09/08/2022)   PRAPARE -  Administrator, Civil Service (Medical): No    Lack of Transportation (Non-Medical): No  Physical Activity: Inactive (09/08/2022)   Exercise Vital Sign    Days of Exercise per Week: 0 days    Minutes of Exercise per Session: 0 min  Stress: Stress Concern Present (09/08/2022)   [employer] of Occupational Health - Occupational Stress Questionnaire    Feeling of Stress : To some extent  Social Connections: Moderately Integrated (09/08/2022)   Social Connection and Isolation Panel    Frequency of Communication with Friends and Family: More than three times a week    Frequency of Social Gatherings with Friends and Family: More than three times a week    Attends Religious Services: More than 4 times per year    Active Member of [employer] or Organizations: Yes    Attends Engineer, structural: More than 4 times per year    Marital Status: Divorced  Intimate Partner Violence: Not At Risk (09/08/2022)   Humiliation, Afraid, Rape, and Kick questionnaire    Fear of Current or Ex-Partner: No    Emotionally Abused: No    Physically Abused: No    Sexually Abused: No    Family History  Problem Relation Age of Onset   Rheum arthritis Mother    Stroke Mother    Prostate cancer Father    Heart disease Father     Diabetes Brother    Dementia Brother    Parkinsonism Brother    Diabetes Brother    Cancer Brother    Dementia Brother    Kidney disease Son    Bipolar disorder Daughter    Colon cancer Neg Hx    Esophageal cancer Neg Hx    Pancreatic cancer Neg Hx    Liver disease Neg Hx    Rectal cancer Neg Hx    Stomach cancer Neg Hx     Current Outpatient Medications on File Prior to Visit  Medication Sig Dispense Refill   acetaminophen  (TYLENOL ) 500 MG tablet Take 1,000 mg by mouth as needed for moderate pain or headache.      Aspirin  81 MG CAPS Take 81 mg by mouth daily.     Budeson-Glycopyrrol-Formoterol  (BREZTRI  AEROSPHERE) 160-9-4.8 MCG/ACT AERO Inhale 2 puffs into the lungs in the morning and at bedtime. 2 each 11   budeson-glycopyrrolate -formoterol  (BREZTRI  AEROSPHERE) 160-9-4.8 MCG/ACT AERO Inhale 2 puffs into the lungs in the morning and at bedtime.     budesonide -glycopyrrolate -formoterol  (BREZTRI  AEROSPHERE) 160-9-4.8 MCG/ACT AERO inhaler Inhale 2 puffs into the lungs in the morning and at bedtime.     famotidine  (PEPCID ) 20 MG tablet TAKE 1 TABLET BY MOUTH EVERY MORNING AND 1 TABLET BY MOUTH EVERY DAY AT BEDTIME 180 tablet 2   fluticasone  (FLONASE ) 50 MCG/ACT nasal spray PLACE 2 SPRAYS INTO BOTH NOSTRILS AT BEDTIME AS NEEDED FOR ALLERGIES. 48 mL 1   hydrochlorothiazide  (HYDRODIURIL ) 25 MG tablet TAKE 1 TABLET BY MOUTH ONCE DAILY 90 tablet 2   ipratropium (ATROVENT ) 0.06 % nasal spray Place 2 sprays into both nostrils 4 (four) times daily. 15 mL 1   losartan  (COZAAR ) 100 MG tablet TAKE 1 TABLET BY MOUTH EVERY MORNING 90 tablet 2   meclizine  (ANTIVERT ) 25 MG tablet Take 1 tablet (25 mg total) by mouth 3 (three) times daily as needed for dizziness. 30 tablet 0   metoprolol  succinate (TOPROL -XL) 100 MG 24 hr tablet TAKE 1 TABLET BY MOUTH DAILY AT NOON 90 tablet 2   nitroGLYCERIN  (NITROSTAT ) 0.4  MG SL tablet Place 1 tablet (0.4 mg total) under the tongue every 5 (five) minutes as needed for  chest pain. 25 tablet 2   pantoprazole  (PROTONIX ) 40 MG tablet TAKE 1 TABLET BY MOUTH ONCE DAILY 30 tablet 9   rosuvastatin  (CRESTOR ) 10 MG tablet TAKE 1 TABLET BY MOUTH EVERY DAY AT BEDTIME 90 tablet 1   sertraline  (ZOLOFT ) 100 MG tablet Take 100 mg by mouth daily.     No current facility-administered medications on file prior to visit.    Allergies  Allergen Reactions   Oxycodone -Acetaminophen  Hives and Itching    hallucinations   Percocet [Oxycodone -Acetaminophen ] Hives, Itching and Other (See Comments)    hallucinations   Pravastatin  Sodium Other (See Comments)    Muscle aches Muscle aches   Hydrocodone  Other (See Comments)    crazy dreams   Aspirin  Other (See Comments)    GI upset Other reaction(s): Other (See Comments) REACTION: GI upset   Lexapro [Escitalopram] Itching    Pt reports caused itching and breaking out.          Physical Exam Vitals requested from patient and listed below if patient had equipment and was able to obtain at home for this virtual visit: There were no vitals filed for this visit. Estimated body mass index is 37.11 kg/m as calculated from the following:   Height as of 01/24/24: 5' 5 (1.651 m).   Weight as of 01/24/24: 223 lb (101.2 kg).  EKG (optional): deferred due to virtual visit  GENERAL: alert, oriented, no acute distress detected, full vision exam deferred due to pandemic and/or virtual encounter  PSYCH/NEURO: pleasant and cooperative, no obvious depression or anxiety, speech and thought processing grossly intact, Cognitive function grossly intact  Flowsheet Row Office Visit from 11/16/2023 in [facility]  PHQ-9 Total Score 5        11/16/2023   11:27 AM 07/08/2023    4:09 PM 04/05/2023   10:44 AM 02/22/2023    3:04 PM 11/17/2022   11:22 AM  Depression screen PHQ 2/9  Decreased Interest 2 2 1 1 1   Down, Depressed, Hopeless 1 2 1 1 1   PHQ - 2 Score 3 4 2 2 2   Altered sleeping 1 1 1 1 1   Tired,  decreased energy 1 0 2 1 2   Change in appetite 0 0 1 1 1   Feeling bad or failure about yourself   1 0 0 1  Trouble concentrating 0 1 1 0 1  Moving slowly or fidgety/restless 0 0 0 0 0  Suicidal thoughts 0 0 0 0 0  PHQ-9 Score 5 7 7 5 8   Difficult doing work/chores  Somewhat difficult Not difficult at all Not difficult at all Somewhat difficult       09/08/2022    2:35 PM 09/27/2022   12:23 PM 04/05/2023   10:44 AM 11/16/2023   11:27 AM 03/15/2024    1:25 PM  Fall Risk  Falls in the past year? 0 0 0 0 0  Was there an injury with Fall? 0 0 0 0 0  Fall Risk Category Calculator 0 0 0 0 0  Patient at Risk for Falls Due to No Fall Risks Other (Comment)     Fall risk Follow up Falls prevention discussed Falls evaluation completed Falls evaluation completed Falls evaluation completed      SUMMARY AND PLAN:  Encounter for Medicare annual wellness exam   Discussed applicable health maintenance/preventive health measures and advised and  referred or ordered per patient preferences: -discussed vaccines due recs/risks; she plans to get her flu shot at the office and the others at the pharmacy -she plans to schedule follow up in the office for her foot exam, labs and flu shot - sent message to schedulers to assist and she agrees to call if doesn't hear from them.  -she says she had her eye appt but can't recall name of doctor - asked that she request copy of report be sent to Dr. [place] Health Maintenance  Topic Date Due   Zoster Vaccines- Shingrix (1 of 2) 01/22/1965   Diabetic kidney evaluation - Urine ACR  03/26/2021   DTaP/Tdap/Td (3 - Td or Tdap) 12/15/2021   OPHTHALMOLOGY EXAM  04/28/2022   COVID-19 Vaccine (4 - 2024-25 season) 04/03/2023   HEMOGLOBIN A1C  10/03/2023   FOOT EXAM  11/17/2023   INFLUENZA VACCINE  03/02/2024   Diabetic kidney evaluation - eGFR measurement  11/15/2024   MAMMOGRAM  03/13/2025   Medicare Annual Wellness (AWV)  03/15/2025   Pneumococcal Vaccine: 50+ Years   Completed   DEXA SCAN  Completed   HPV VACCINES  Aged Out   Meningococcal B Vaccine  Aged Out   Pneumococcal Vaccine  Discontinued   Colonoscopy  Discontinued   Hepatitis C Screening  Discontinued      Education and counseling on the following was provided based on the above review of health and a plan/checklist for the patient, along with additional information discussed, was provided for the patient in the patient instructions :  -Advised and counseled on a healthy lifestyle - including the importance of a healthy diet, regular physical activity, social connections and stress management. -Reviewed patient's current diet. Advised and counseled on a whole foods based healthy diet. A summary of a healthy diet was provided in the Patient Instructions.  -reviewed patient's current physical activity level and discussed exercise guidelines for adults. [facility], which would be wonderful. Discussed community resources and ideas for safe exercise at home to assist in meeting exercise guideline recommendations in a safe and healthy way.  -Advise yearly dental visits at minimum and regular eye exams   Follow up: see patient instructions     Patient Instructions  I really enjoyed getting to talk with you today! I am available on Tuesdays and Thursdays for virtual visits if you have any questions or concerns, or if I can be of any further assistance.   CHECKLIST FROM ANNUAL WELLNESS VISIT:  -Follow up (please call to schedule if not scheduled after visit):   -yearly for annual wellness visit with primary care office  Here is a list of your preventive care/health maintenance measures and the plan for each if any are due:  PLAN For any measures below that may be due:    1. Please call to schedule follow up with Dr. Dickey for diabetes, flu shot, foot exam in office in September.   2. Can get the other vaccines at the pharmacy - if you do -  please let us  know so that we can update your record.   Health Maintenance  Topic Date Due   Zoster Vaccines- Shingrix (1 of 2) 01/22/1965   Diabetic kidney evaluation - Urine ACR  03/26/2021   DTaP/Tdap/Td (3 - Td or Tdap) 12/15/2021   OPHTHALMOLOGY EXAM  04/28/2022   COVID-19 Vaccine (4 - 2024-25 season) 04/03/2023   HEMOGLOBIN A1C  10/03/2023  FOOT EXAM  11/17/2023   INFLUENZA VACCINE  03/02/2024   Diabetic kidney evaluation - eGFR measurement  11/15/2024   MAMMOGRAM  03/13/2025   Medicare Annual Wellness (AWV)  03/15/2025   Pneumococcal Vaccine: 50+ Years  Completed   DEXA SCAN  Completed   HPV VACCINES  Aged Out   Meningococcal B Vaccine  Aged Out   Pneumococcal Vaccine  Discontinued   Colonoscopy  Discontinued   Hepatitis C Screening  Discontinued    -See a dentist at least yearly  -Get your eyes checked and then per your eye specialist's recommendations  -Other issues addressed today:   -I have included below further information regarding a healthy whole foods based diet, physical activity guidelines for adults, stress management and opportunities for social connections. I hope you find this information useful.   -----------------------------------------------------------------------------------------------------------------------------------------------------------------------------------------------------------------------------------------------------------    NUTRITION: -eat real food: lots of colorful vegetables (half the plate) and fruits -5-7 servings of vegetables and fruits per day (fresh or steamed is best), exp. 2 servings of vegetables with lunch and dinner and 2 servings of fruit per day. Berries and greens such as kale and collards are great choices.  -consume on a regular basis:  fresh fruits, fresh veggies, fish, nuts, seeds, healthy oils (such as olive oil, avocado oil), whole grains (make sure for bread/pasta/crackers/etc., that the first ingredient  on label contains the word whole), legumes. -can eat small amounts of dairy and lean meat (no larger than the palm of your hand), but avoid processed meats such as ham, bacon, lunch meat, etc. -drink water  -try to avoid fast food and pre-packaged foods, processed meat, ultra processed foods/beverages (donuts, candy, etc.) -most experts advise limiting sodium to < 2300mg  per day, should limit further is any chronic conditions such as high blood pressure, heart disease, diabetes, etc. The American Heart Association advised that < 1500mg  is is ideal -try to avoid foods/beverages that contain any ingredients with names you do not recognize  -try to avoid foods/beverages  with added sugar or sweeteners/sweets  -try to avoid sweet drinks (including diet drinks): soda, juice, Gatorade, sweet tea, power drinks, diet drinks -try to avoid white rice, white bread, pasta (unless whole grain)  EXERCISE GUIDELINES FOR ADULTS: -if you wish to increase your physical activity, do so gradually and with the approval of your doctor -STOP and seek medical care immediately if you have any chest pain, chest discomfort or trouble breathing when starting or increasing exercise  -move and stretch your body, legs, feet and arms when sitting for long periods -Physical activity guidelines for optimal health in adults: -get at least 150 minutes per week of moderate exercise (can talk, but not sing); this is about 20-30 minutes of sustained activity 5-7 days per week or two 10-15 minute episodes of sustained activity 5-7 days per week -do some muscle building/resistance training/strength training at least 2 days per week  -balance exercises 3+ days per week:   Stand somewhere where you have something sturdy to hold onto if you lose balance    1) lift up on toes, then back down, start with 5x per day and work up to 20x   2) stand and lift one leg straight out to the side so that foot is a few inches of the floor, start with  5x each side and work up to 20x each side   3) stand on one foot, start with 5 seconds each side and work up to 20 seconds on each side  If you  need ideas or help with getting more active:  -Silver  sneakers https://tools.silversneakers.com  -Walk with a Doc: http://www.duncan-williams.com/  -try to include resistance (weight lifting/strength building) and balance exercises twice per week: or the following link for ideas: http://castillo-powell.com/  BuyDucts.dk  STRESS MANAGEMENT: -can try meditating, or just sitting quietly with deep breathing while intentionally relaxing all parts of your body for 5 minutes daily -if you need further help with stress, anxiety or depression please follow up with your primary doctor or contact the wonderful folks at [facility]: [phone]  SOCIAL CONNECTIONS: -options in [place] if you wish to engage in more social and exercise related activities:  -Silver  sneakers https://tools.silversneakers.com  -Walk with a Doc: http://www.duncan-williams.[email or web address]/  -Check out the [facility] Active Adults 50+ section on the [place] [employer] (hiking clubs, book clubs, cards and games, chess, exercise classes, aquatic classes and much more) - see the website for details: https://www.[place].gov/departments/parks-recreation/active-adults50  -YouTube has lots of exercise videos for different ages and abilities as well  -[facility] (a variety of indoor and outdoor inperson activities for adults). [phone]. [address].  -Virtual Online Classes (a variety of topics): see seniorplanet.org or call (629) [phone]  -consider volunteering at a school, hospice center, church, senior center or elsewhere            Chiquita JONELLE Cramp, DO

## 2024-03-22 ENCOUNTER — Ambulatory Visit (INDEPENDENT_AMBULATORY_CARE_PROVIDER_SITE_OTHER): Admitting: Psychology

## 2024-03-22 DIAGNOSIS — F331 Major depressive disorder, recurrent, moderate: Secondary | ICD-10-CM

## 2024-03-22 NOTE — Progress Notes (Signed)
 [facility] Counselor/Therapist Progress Note  Patient ID: [name], MRN: [id number],    Date: 03/22/2024  Time Spent: 2:30pm-3:32pm  pt is seen for an in person visit.     Treatment Type: Individual Therapy  Reported Symptoms: pt reports sadness and grief.  Feeling less want to engage.    Mental Status Exam: Appearance:  Well Groomed     Behavior: Appropriate  Motor: Normal  Speech/Language:  Normal Rate  Affect: Appropriate  Mood: depressed  Thought process: normal  Thought content:   WNL  Sensory/Perceptual disturbances:   WNL  Orientation: oriented to person, place, time/date, and situation  Attention: Good  Concentration: Good  Memory: WNL  Fund of knowledge:  Good  Insight:   Good  Judgment:  Good  Impulse Control: Good   Risk Assessment: Danger to Self:  No Self-injurious Behavior: No Danger to Others: No Duty to Warn:no Physical Aggression / Violence:No  Access to Firearms a concern: No  Gang Involvement:No   Subjective: Counselor assessed pt current functioning per pt report. Processed w/pt mood and grief.  Validated and normalized pt grief and process.  Assisted pt w/ recognizing want to isolate and importance of engaging.  Encouraged reaching out to friends and reflected positive of getting out of her room.    Pt affect congruent w/ report of feeling down.  Pt reports she has been thinking of her son a lot recent and grief.  Pt reports that her daughter had 2nd knee replacement and has been rougher than first  pt reports also concern for oldest son w/ his addiction hx and decreased contact.  Pt identified that wants to stay in room a lot more and recognized and makes self get out and out of house.  Pt reflects that is helpful.  Pt also reports has several phone calls need to return to friends and that would be helpful to connect.    Interventions: supportive  Diagnosis:Major depressive disorder, recurrent episode, moderate (HCC)  Plan: pt to  f/u in 3-4 weeks for counseling.  Pt to f/u as scheduled w/ PCP and psychiatrist as scheduled.      Individualized Treatment Plan Strengths: enjoys time w/ daughter, watching her t.v shows.  Baking/cooking at times.    Supports: her daughter, her cousin   Goal/Needs for Treatment:  In order of importance to patient 1) continue engagement in activities/ talk w/ friends 2) decreased depressed moods 3) work through Therapist, art of Needs: continue to have support to cope w/ depression and grieve     Treatment Level:out pt counseling  Symptoms:depressed mood, tearfulness, loss of interest, difficulty w/ motivation, withdrawn from interacting.   Client Treatment Preferences:biweekly to monthly in person counseling.  Continue medication management w/ psychiatrist.    Healthcare consumer's goal for treatment:  Counselor, [name], [facility] will support the patient's ability to achieve the goals identified. Cognitive Behavioral Therapy, Assertive Communication/Conflict Resolution Training, Relaxation Training, ACT, Humanistic and other evidenced-based practices will be used to promote progress towards healthy functioning.   Healthcare consumer will: Actively participate in therapy, working towards healthy functioning.    *Justification for Continuation/Discontinuation of Goal: R=Revised, O=Ongoing, A=Achieved, D=Discontinued  Goal 1)  Increased engagement w/ activities she enjoys and w/ social interactions AEB pt report and counselor observation. Baseline date 12/29/23: Progress towards goal 50; How Often - Daily Target Date Goal Was reviewed Status Code Progress towards goal/Likert rating  12/28/24  Goal 2) Increased coping w/ losses and w/ depressed moods AEB utilizing coping skills daily.   Baseline date 12/29/23: Progress towards goal 0; How Often - Daily Target Date Goal Was reviewed Status Code Progress towards goal  12/28/24                  This plan  has been reviewed and created by the following participants:  This plan will be reviewed at least every 12 months. Date Behavioral Health Clinician Date Guardian/Patient   12/29/23  [facility] Barbarann [facility] 12/29/23 Verbal Consent Provided and electronic signature requested                                [name] LCM[facility]

## 2024-03-23 ENCOUNTER — Ambulatory Visit (INDEPENDENT_AMBULATORY_CARE_PROVIDER_SITE_OTHER): Admitting: Family Medicine

## 2024-03-23 ENCOUNTER — Encounter: Payer: Self-pay | Admitting: Family Medicine

## 2024-03-23 VITALS — BP 110/70 | HR 70 | Resp 16 | Ht 65.0 in | Wt 219.4 lb

## 2024-03-23 DIAGNOSIS — F331 Major depressive disorder, recurrent, moderate: Secondary | ICD-10-CM | POA: Diagnosis not present

## 2024-03-23 DIAGNOSIS — E1169 Type 2 diabetes mellitus with other specified complication: Secondary | ICD-10-CM

## 2024-03-23 DIAGNOSIS — G959 Disease of spinal cord, unspecified: Secondary | ICD-10-CM | POA: Diagnosis not present

## 2024-03-23 DIAGNOSIS — C649 Malignant neoplasm of unspecified kidney, except renal pelvis: Secondary | ICD-10-CM

## 2024-03-23 DIAGNOSIS — E785 Hyperlipidemia, unspecified: Secondary | ICD-10-CM

## 2024-03-23 DIAGNOSIS — Z85528 Personal history of other malignant neoplasm of kidney: Secondary | ICD-10-CM

## 2024-03-23 DIAGNOSIS — N183 Chronic kidney disease, stage 3 unspecified: Secondary | ICD-10-CM

## 2024-03-23 DIAGNOSIS — I119 Hypertensive heart disease without heart failure: Secondary | ICD-10-CM | POA: Diagnosis not present

## 2024-03-23 DIAGNOSIS — E1122 Type 2 diabetes mellitus with diabetic chronic kidney disease: Secondary | ICD-10-CM | POA: Diagnosis not present

## 2024-03-23 LAB — POCT GLYCOSYLATED HEMOGLOBIN (HGB A1C): HbA1c, POC (prediabetic range): 6.1 % (ref 5.7–6.4)

## 2024-03-23 NOTE — Assessment & Plan Note (Signed)
 Last LDL 32 in 01/2023. Currently on rosuvastatin  10 mg daily. Will plan on fasting lipid panel at next visit if not done at her cardiologist's office.

## 2024-03-23 NOTE — Patient Instructions (Addendum)
 A few things to remember from today's visit:  Cervical myelopathy (HCC), Chronic  Malignant neoplasm of kidney excluding renal pelvis, unspecified laterality (HCC), Chronic  Moderate episode of recurrent major depressive disorder (HCC), Chronic  Type 2 diabetes mellitus with stage 3 chronic kidney disease, without long-term current use of insulin , unspecified whether stage 3a or 3b CKD (HCC), Chronic - Plan: POC HgB A1c  No changes today.  If you need refills for medications you take chronically, please call your pharmacy. Do not use My Chart to request refills or for acute issues that need immediate attention. If you send a my chart message, it may take a few days to be addressed, specially if I am not in the office.  Please be sure medication list is accurate. If a new problem present, please set up appointment sooner than planned today.

## 2024-03-23 NOTE — Assessment & Plan Note (Signed)
Problem is adequately controlled. HgA1C stable at 6.1. Continue nonpharmacologic treatment. Continue annual eye exam, periodic dental and foot care. F/U in 5-6 months.

## 2024-03-23 NOTE — Assessment & Plan Note (Signed)
 Improved some side her last visit. Since her last visit she has established with psychiatrist and continue seeing psychotherapist regularly.

## 2024-03-23 NOTE — Assessment & Plan Note (Signed)
 BP today adequately controlled Continue metoprolol  succinate 100 mg daily, losartan  100 mg daily, and hydrochlorothiazide  25 mg daily as well as low salt diet. Due to follow with cardiologist in 07/2024. Eye exam is current.

## 2024-03-23 NOTE — Assessment & Plan Note (Signed)
 Stable. She is not longer on gabapentin  Following with neurosurgeon.

## 2024-03-23 NOTE — Progress Notes (Addendum)
 HPI: [name] is a 78 y.o. female with a PMHx significant for CAD, HTN, PAD, COPD, OSA, GERD, DM II, HLD, polyneuropathy, CKD III, B12 deficiency, and vitamin D  defciency, who is here today for chronic disease management.  Last seen on 01/11/2024  Since her last visit, she has followed up with neurology, established nephrology, and followed with urology. She has an appt scheduled with immunologist on 03/27/24 and has a follow up appt with pulmonology on 04/23/24.   Overall, she is followed by neurology, cardiology, pulmonology, nephrology, urology, rheumatology, behavioral health/psychiatrist.   Hypertension: Currently on HCTZ 25 mg once daily, Losartan  100 gm once daily, and Metoprolol  succinate 100 mg once daily.  Good compliance and tolerance.  Negative for unusual or severe headache, visual changes, exertional chest pain, dyspnea,  focal weakness, or edema. CAD and PAD: Follows with cardiologist.  Lab Results  Component Value Date   CREATININE 1.78 (H) 11/16/2023   BUN 25 (H) 11/16/2023   NA 140 11/16/2023   K 3.6 11/16/2023   CL 103 11/16/2023   CO2 27 11/16/2023   Diabetes Mellitus II: Dx'ed in 2015. - Not currently on pharmacologic treatment.  - Diet: healthy diet overall.  - Eye exam: Between 10/2023 and another - Foot exam: 04/2021. - Negative for symptoms of hypoglycemia, polyuria, polydipsia, foot ulcers/trauma. Intermittent feet and Ue's numbness and tingling.  Last hemoglobin A1c 6.1 on 04/05/2023.  Lab Results  Component Value Date   MICROALBUR 5.8 03/26/2020   Hyperlipidemia: Currently on Rosuvastatin  10 mg once daily.  Following a low fat diet.  Lab Results  Component Value Date   CHOL 143 09/11/2020   HDL 56 09/11/2020   LDLCALC 69 09/11/2020   LDLDIRECT 171.5 12/06/2011   TRIG 98 09/11/2020   CHOLHDL 2.6 09/11/2020   Anxiety / Depression: Mood is overall stable. Currently on Sertraline  100 mg once daily. Tolerating well with no side effects  reported.  She is following up with her counselor. She continues to struggle with depression at times; related to grieving her son.   Following up with urology annually.  S/p R nephrectomy due to renal cell cancer 2010.   Cervical myelopathy  and lumbar stenosis: Follows with neurosurgeon as needed. Peripheral neuropathy - following up with neurology.   COPD - following up with pulmonology. Currently on Breztri  2 puffs BID with good compliance/tolerance.   Review of Systems  Constitutional:  Negative for activity change, appetite change and fever.  HENT:  Negative for mouth sores and sore throat.   Respiratory:  Negative for cough and wheezing.   Gastrointestinal:  Negative for abdominal pain, nausea and vomiting.  Genitourinary:  Negative for decreased urine volume, difficulty urinating, dysuria and hematuria.  Musculoskeletal:  Positive for arthralgias.  Allergic/Immunologic: Positive for environmental allergies.  Neurological:  Negative for syncope and facial asymmetry.  Psychiatric/Behavioral:  Negative for confusion and hallucinations.   See other pertinent positives and negatives in HPI.  Current Outpatient Medications on File Prior to Visit  Medication Sig Dispense Refill   acetaminophen  (TYLENOL ) 500 MG tablet Take 1,000 mg by mouth as needed for moderate pain or headache.      Aspirin  81 MG CAPS Take 81 mg by mouth daily.     Budeson-Glycopyrrol-Formoterol  (BREZTRI  AEROSPHERE) 160-9-4.8 MCG/ACT AERO Inhale 2 puffs into the lungs in the morning and at bedtime. 2 each 11   budeson-glycopyrrolate -formoterol  (BREZTRI  AEROSPHERE) 160-9-4.8 MCG/ACT AERO Inhale 2 puffs into the lungs in the morning and at bedtime.  budesonide -glycopyrrolate -formoterol  (BREZTRI  AEROSPHERE) 160-9-4.8 MCG/ACT AERO inhaler Inhale 2 puffs into the lungs in the morning and at bedtime.     famotidine  (PEPCID ) 20 MG tablet TAKE 1 TABLET BY MOUTH EVERY MORNING AND 1 TABLET BY MOUTH EVERY DAY AT BEDTIME 180  tablet 2   fluticasone  (FLONASE ) 50 MCG/ACT nasal spray PLACE 2 SPRAYS INTO BOTH NOSTRILS AT BEDTIME AS NEEDED FOR ALLERGIES. 48 mL 1   hydrochlorothiazide  (HYDRODIURIL ) 25 MG tablet TAKE 1 TABLET BY MOUTH ONCE DAILY 90 tablet 2   ipratropium (ATROVENT ) 0.06 % nasal spray Place 2 sprays into both nostrils 4 (four) times daily. 15 mL 1   losartan  (COZAAR ) 100 MG tablet TAKE 1 TABLET BY MOUTH EVERY MORNING 90 tablet 2   meclizine  (ANTIVERT ) 25 MG tablet Take 1 tablet (25 mg total) by mouth 3 (three) times daily as needed for dizziness. 30 tablet 0   metoprolol  succinate (TOPROL -XL) 100 MG 24 hr tablet TAKE 1 TABLET BY MOUTH DAILY AT NOON 90 tablet 2   nitroGLYCERIN  (NITROSTAT ) 0.4 MG SL tablet Place 1 tablet (0.4 mg total) under the tongue every 5 (five) minutes as needed for chest pain. 25 tablet 2   pantoprazole  (PROTONIX ) 40 MG tablet TAKE 1 TABLET BY MOUTH ONCE DAILY 30 tablet 9   rosuvastatin  (CRESTOR ) 10 MG tablet TAKE 1 TABLET BY MOUTH EVERY DAY AT BEDTIME 90 tablet 1   sertraline  (ZOLOFT ) 100 MG tablet Take 100 mg by mouth daily.     No current facility-administered medications on file prior to visit.    Past Medical History:  Diagnosis Date   Allergy     Anxiety    C. difficile diarrhea    history of   CAD (coronary artery disease), native coronary artery    a. 05/2017 showed 100% mLCx tx with DES, otherwise 20% mRCA, 50% ramus, 30% dLAD, 20% prox-mid LAD, 20% mLAD treated medically.    Cancer [facility])    Right kidney CA removed.    CKD (chronic kidney disease) stage 3, GFR 30-59 ml/min (HCC) 10/11/2016   s/p R nephrectomy   Colon polyps 2008   HYPERPLASTIC   COPD (chronic obstructive pulmonary disease) (HCC)    former smoker   Depression    Gastritis    GERD (gastroesophageal reflux disease)    History of echocardiogram    Echo 3/18:  Moderate LVH, EF 60-65, grade 1 diastolic dysfunction, calcified aortic valve, mild MR, moderate LAE   History of nuclear stress test     Myoview  3/18: Mod size and intensity fixed septal defect, may be artifact.Opposite mod size and intensity lat defect, which is reversible and could represent ischemia or possibly artifact (SDS 4). LVEF 71% with normal wall motion. Intermediate risk study. >> images reviewed with Dr. Vina Gull - no sig ischemia; med rx    Hx of cardiovascular stress test    Lexiscan  Myoview  6/16:  EF 70%, no scar or ischemia; Low Risk   Hyperlipidemia    Hypertension    Myocardial infarction (HCC)    2018   Neuropathy    Orthostatic hypotension 05/28/2017   OSA (obstructive sleep apnea) 01/16/2021   Osteoarthritis    Pre-diabetes    no meds   Rheumatoid arthritis (HCC)    RA (Dr. Lenon) Bilateral hands   S/P angioplasty with stent 05/27/17 to LCX with DES  05/28/2017   Sleep apnea 2023   does not use CPAP   Thyroid  disease    Allergies  Allergen Reactions   Oxycodone -Acetaminophen   Hives and Itching    hallucinations   Percocet [Oxycodone -Acetaminophen ] Hives, Itching and Other (See Comments)    hallucinations   Pravastatin  Sodium Other (See Comments)    Muscle aches Muscle aches   Hydrocodone  Other (See Comments)    crazy dreams   Aspirin  Other (See Comments)    GI upset Other reaction(s): Other (See Comments) REACTION: GI upset   Lexapro [Escitalopram] Itching    Pt reports caused itching and breaking out.      Social History   Socioeconomic History   Marital status: Divorced    Spouse name: Not on file   Number of children: 3   Years of education: 12   Highest education level: Not on file  Occupational History   Occupation: Retired    Associate Professor: RETIRED  Tobacco Use   Smoking status: Former    Current packs/day: 0.00    Average packs/day: 0.2 packs/day for 53.1 years (10.6 ttl pk-yrs)    Types: Cigarettes    Start date: 15    Quit date: 09/07/2021    Years since quitting: 2.5   Smokeless tobacco: Never   Tobacco comments:    pt has been an on and off smoker since age  71  Vaping Use   Vaping status: Never Used  Substance and Sexual Activity   Alcohol use: No    Alcohol/week: 0.0 standard drinks of alcohol   Drug use: No   Sexual activity: Not Currently    Birth control/protection: Surgical    Comment: hyst  Other Topics Concern   Not on file  Social History Narrative   2025- dgtr lives with her   Alcohol use- no   Drug use-no   R handed    Social Drivers of Health   Financial Resource Strain: Low Risk  (09/08/2022)   Overall Financial Resource Strain (CARDIA)    Difficulty of Paying Living Expenses: Not hard at all  Food Insecurity: No Food Insecurity (10/20/2022)   Hunger Vital Sign    Worried About Running Out of Food in the Last Year: Never true    Ran Out of Food in the Last Year: Never true  Transportation Needs: No Transportation Needs (09/08/2022)   PRAPARE - Administrator, Civil Service (Medical): No    Lack of Transportation (Non-Medical): No  Physical Activity: Inactive (09/08/2022)   Exercise Vital Sign    Days of Exercise per Week: 0 days    Minutes of Exercise per Session: 0 min  Stress: Stress Concern Present (09/08/2022)   [employer] of Occupational Health - Occupational Stress Questionnaire    Feeling of Stress : To some extent  Social Connections: Moderately Integrated (09/08/2022)   Social Connection and Isolation Panel    Frequency of Communication with Friends and Family: More than three times a week    Frequency of Social Gatherings with Friends and Family: More than three times a week    Attends Religious Services: More than 4 times per year    Active Member of Clubs or Organizations: Yes    Attends Banker Meetings: More than 4 times per year    Marital Status: Divorced   Vitals:   03/23/24 1317  BP: 110/70  Pulse: 70  Resp: 16  SpO2: 96%   Body mass index is 36.51 kg/m.  Physical Exam Vitals and nursing note reviewed.  Constitutional:      General: She is not in acute  distress.    Appearance: She is well-developed.  HENT:     Head: Normocephalic and atraumatic.     Mouth/Throat:     Mouth: Mucous membranes are moist.     Pharynx: Oropharynx is clear.  Eyes:     Conjunctiva/sclera: Conjunctivae normal.  Cardiovascular:     Rate and Rhythm: Normal rate and regular rhythm.     Pulses:          Dorsalis pedis pulses are 2+ on the right side and 2+ on the left side.     Heart sounds: Murmur (Soft SEM RUSB) heard.  Pulmonary:     Effort: Pulmonary effort is normal. No respiratory distress.     Breath sounds: Normal breath sounds.  Abdominal:     Palpations: Abdomen is soft. There is no mass.     Tenderness: There is no abdominal tenderness.  Lymphadenopathy:     Cervical: No cervical adenopathy.  Skin:    General: Skin is warm.     Findings: No erythema or rash.  Neurological:     General: No focal deficit present.     Mental Status: She is alert and oriented to person, place, and time.     Cranial Nerves: No cranial nerve deficit.     Gait: Gait normal.  Psychiatric:        Mood and Affect: Mood and affect normal.    ASSESSMENT AND PLAN: Ms. JOESPHINE SCHEMM was seen today for a follow up on chronic disease management.  Orders Placed This Encounter  Procedures   POC HgB A1c   Lab Results  Component Value Date   HGBA1C 6.1 03/23/2024   Type 2 diabetes mellitus with stage 3 chronic kidney disease, without long-term current use of insulin , unspecified whether stage 3a or 3b CKD (HCC) Assessment & Plan: Problem is adequately controlled. HgA1C stable at 6.1. Continue nonpharmacologic treatment. Continue annual eye exam, periodic dental and foot care. F/U in 5-6 months.  Orders: -     POCT glycosylated hemoglobin (Hb A1C)  Hyperlipidemia associated with type 2 diabetes mellitus (HCC) Assessment & Plan: Last LDL 32 in 01/2023. Currently on rosuvastatin  10 mg daily. Will plan on fasting lipid panel at next visit if not done at her  cardiologist's office.  Cervical myelopathy (HCC) Assessment & Plan: Stable. She is not longer on gabapentin  Following with neurosurgeon.  Hypertension with heart disease Assessment & Plan: BP today adequately controlled Continue metoprolol  succinate 100 mg daily, losartan  100 mg daily, and hydrochlorothiazide  25 mg daily as well as low salt diet. Due to follow with cardiologist in 07/2024. Eye exam is current.  Personal history of malignant neoplasm of kidney   Assessment & Plan: Status post right nephrectomy in 2010. Foillows with urologist annually, seen last 03/16/24.  Moderate episode of recurrent major depressive disorder [facility]) Assessment & Plan: Improved some side her last visit. Since her last visit she has established with psychiatrist and continue seeing psychotherapist regularly.   Return in about 6 months (around 09/23/2024) for chronic problems.  I, Vernell Forest, acting as a scribe for Adriell Polansky [place], MD., have documented all relevant documentation on the behalf of Mikle Sternberg [place], MD, as directed by   while in the presence of Meet Weathington [place], MD.  I, Raelin Pixler [place], MD, have reviewed all documentation for this visit. The documentation on 03/23/24 for the exam, diagnosis, procedures, and orders are all accurate and complete.  Temesgen Weightman G. [place], MD  [facility]

## 2024-03-23 NOTE — Assessment & Plan Note (Signed)
 Status post right nephrectomy in 2010. Foillows with urologist annually, seen last 03/16/24.

## 2024-03-26 DIAGNOSIS — F4320 Adjustment disorder, unspecified: Secondary | ICD-10-CM | POA: Diagnosis not present

## 2024-03-26 DIAGNOSIS — F331 Major depressive disorder, recurrent, moderate: Secondary | ICD-10-CM | POA: Diagnosis not present

## 2024-03-27 ENCOUNTER — Other Ambulatory Visit: Payer: Self-pay

## 2024-03-27 ENCOUNTER — Ambulatory Visit: Admitting: Internal Medicine

## 2024-03-27 ENCOUNTER — Encounter: Payer: Self-pay | Admitting: Internal Medicine

## 2024-03-27 VITALS — BP 118/80 | HR 65 | Temp 98.2°F | Resp 14 | Ht 66.0 in | Wt 218.2 lb

## 2024-03-27 DIAGNOSIS — J3089 Other allergic rhinitis: Secondary | ICD-10-CM | POA: Diagnosis not present

## 2024-03-27 DIAGNOSIS — K219 Gastro-esophageal reflux disease without esophagitis: Secondary | ICD-10-CM | POA: Diagnosis not present

## 2024-03-27 NOTE — Progress Notes (Signed)
 NEW PATIENT  Date of Service/Encounter:  03/27/24  Consult requested by: [place], [name], MD   Subjective:   [name] (DOB: 1945/11/07) is a 78 y.o. female who presents to the clinic on 03/27/2024 with a chief complaint of Establish Care (She presents with stuffy and runny  nose and headache during the summer. She has a history of COPD. ) .    History obtained from: chart review and patient.   Rhinitis:  Started around 2024.  Was having a lot of stress at the time.  Symptoms include: headaches, dizziness, pressure behind the eyes/forehead, nasal congestion, rhinorrhea, and post nasal drainage  Occurs seasonally-Summer Potential triggers: not sure  Treatments tried:  Flonase  daily  Claritin PRN   Previous allergy testing: many years ago, can't recall results  History of sinus surgery: no Nonallergic triggers: none    COPD: Doing well, denies trouble with frequent wheezing/SOB; followed by Dr Neysa Carder On Breztri  2 puffs twice daily Quit smoking about 1.5 years ago.   Smoking around age 64s. Highest smoked was 1/2 ppd  GERD: Previously had trouble with reflux and heartburn and was on PPI but now off ofit as she was doing well.    Reviewed:  01/11/2024: seen by Dr [place] PCP for dizziness, congestion, thought to be allergic rhinitis, referred to Allergist.  Use Flonase /Atrovent /Singulair /Claritin  04/20/2023: seen by Cardiology for CAD s/p PCI, HTN, HLD, DM, CKD. On ASA, Repatha , Toprolol, Crestor .    10/19/2022: seen by Dr neysa Carder for COPD and OSA. On Breztri  and CPAP.   Past Medical History: Past Medical History:  Diagnosis Date   Allergy    Angio-edema    Anxiety    C. difficile diarrhea    history of   CAD (coronary artery disease), native coronary artery    a. 05/2017 showed 100% mLCx tx with DES, otherwise 20% mRCA, 50% ramus, 30% dLAD, 20% prox-mid LAD, 20% mLAD treated medically.    Cancer [facility])    Right kidney CA removed.    CKD (chronic kidney  disease) stage 3, GFR 30-59 ml/min (HCC) 10/11/2016   s/p R nephrectomy   Colon polyps 2008   HYPERPLASTIC   COPD (chronic obstructive pulmonary disease) (HCC)    former smoker   Depression    Gastritis    GERD (gastroesophageal reflux disease)    History of echocardiogram    Echo 3/18:  Moderate LVH, EF 60-65, grade 1 diastolic dysfunction, calcified aortic valve, mild MR, moderate LAE   History of nuclear stress test    Myoview  3/18: Mod size and intensity fixed septal defect, may be artifact.Opposite mod size and intensity lat defect, which is reversible and could represent ischemia or possibly artifact (SDS 4). LVEF 71% with normal wall motion. Intermediate risk study. >> images reviewed with Dr. Vina Gull - no sig ischemia; med rx    Hx of cardiovascular stress test    Lexiscan  Myoview  6/16:  EF 70%, no scar or ischemia; Low Risk   Hyperlipidemia    Hypertension    Myocardial infarction (HCC)    2018   Neuropathy    Orthostatic hypotension 05/28/2017   OSA (obstructive sleep apnea) 01/16/2021   Osteoarthritis    Pre-diabetes    no meds   Recurrent upper respiratory infection (URI)    Rheumatoid arthritis (HCC)    RA (Dr. Lenon) Bilateral hands   S/P angioplasty with stent 05/27/17 to LCX with DES  05/28/2017   Sleep apnea 2023   does  not use CPAP   Thyroid  disease    Past Surgical History: Past Surgical History:  Procedure Laterality Date   ABDOMINAL HYSTERECTOMY  1978   ANTERIOR CERVICAL DECOMP/DISCECTOMY FUSION N/A 05/29/2018   Procedure: ANTERIOR CERVICAL DECOMPRESSION FUSION - CERVICAL FIVE-CERVICAL SIX - CERVICAL SIX-CERVICAL SEVEN;  Surgeon: Louis Shove, MD;  Location: [facility] OR;  Service: Neurosurgery;  Laterality: N/A;   BACK SURGERY     x 2   BREAST LUMPECTOMY WITH RADIOACTIVE SEED LOCALIZATION Right 04/13/2022   Procedure: RIGHT BREAST SEED LUMPECTOMY;  Surgeon: Vanderbilt Ned, MD;  Location: [facility];  Service: General;  Laterality: Right;    CARPAL TUNNEL RELEASE Left    COLONOSCOPY     COLONOSCOPY W/ BIOPSIES AND POLYPECTOMY     Hx: of   CORONARY STENT INTERVENTION N/A 05/27/2017   Procedure: CORONARY STENT INTERVENTION;  Surgeon: Verlin Lonni BIRCH, MD;  Location: [facility];  Service: Cardiovascular;  Laterality: N/A;   ESOPHAGOGASTRODUODENOSCOPY     HNP     LEFT HEART CATH AND CORONARY ANGIOGRAPHY N/A 05/27/2017   Procedure: LEFT HEART CATH AND CORONARY ANGIOGRAPHY;  Surgeon: Verlin Lonni BIRCH, MD;  Location: [facility];  Service: Cardiovascular;  Laterality: N/A;   LUMBAR LAMINECTOMY/DECOMPRESSION MICRODISCECTOMY Left 02/12/2013   Procedure: LUMBAR TWO THREE, LUMBAR THREE FOUR, LUMBAR FOUR FIVE  LAMINECTOMY/DECOMPRESSION MICRODISCECTOMY 3 LEVELS;  Surgeon: Shove DELENA Louis, MD;  Location: [facility];  Service: Neurosurgery;  Laterality: Left;   NEPHRECTOMY Right 2010   10.rcc cancer   TOTAL KNEE ARTHROPLASTY Right    Redo   UPPER GASTROINTESTINAL ENDOSCOPY      Family History: Family History  Problem Relation Age of Onset   Rheum arthritis Mother    Stroke Mother    Prostate cancer Father    Heart disease Father    Diabetes Brother    Dementia Brother    Parkinsonism Brother    Diabetes Brother    Cancer Brother    Dementia Brother    Kidney disease Son    Bipolar disorder Daughter    Colon cancer Neg Hx    Esophageal cancer Neg Hx    Pancreatic cancer Neg Hx    Liver disease Neg Hx    Rectal cancer Neg Hx    Stomach cancer Neg Hx     Social History:  Flooring in bedroom: wood Pets: Medical laboratory scientific officer Tobacco use/exposure: Quit smoking about 1.5 years ago.   Smoking around age 18s. Highest smoked was 1/2 ppd Job: n/a  Medication List:  Allergies as of 03/27/2024       Reactions   Oxycodone -acetaminophen  Hives, Itching   hallucinations   Percocet [oxycodone -acetaminophen ] Hives, Itching, Other (See Comments)   hallucinations   Pravastatin  Sodium Other (See Comments)   Muscle  aches Muscle aches   Hydrocodone  Other (See Comments)   crazy dreams   Aspirin  Other (See Comments)   GI upset Other reaction(s): Other (See Comments) REACTION: GI upset   Lexapro [escitalopram] Itching   Pt reports caused itching and breaking out.           Medication List        Accurate as of March 27, 2024  2:44 PM. If you have any questions, ask your nurse or doctor.          acetaminophen  500 MG tablet Commonly known as: TYLENOL  Take 1,000 mg by mouth as needed for moderate pain or headache.   Aspirin  81 MG Caps Take 81 mg by  mouth daily.   Breztri  Aerosphere 160-9-4.8 MCG/ACT Aero inhaler Generic drug: budesonide -glycopyrrolate -formoterol  Inhale 2 puffs into the lungs in the morning and at bedtime.   Breztri  Aerosphere 160-9-4.8 MCG/ACT Aero inhaler Generic drug: budesonide -glycopyrrolate -formoterol  Inhale 2 puffs into the lungs in the morning and at bedtime.   Breztri  Aerosphere 160-9-4.8 MCG/ACT Aero inhaler Generic drug: budesonide -glycopyrrolate -formoterol  Inhale 2 puffs into the lungs in the morning and at bedtime.   famotidine  20 MG tablet Commonly known as: PEPCID  TAKE 1 TABLET BY MOUTH EVERY MORNING AND 1 TABLET BY MOUTH EVERY DAY AT BEDTIME   fluticasone  50 MCG/ACT nasal spray Commonly known as: FLONASE  PLACE 2 SPRAYS INTO BOTH NOSTRILS AT BEDTIME AS NEEDED FOR ALLERGIES.   hydrochlorothiazide  25 MG tablet Commonly known as: HYDRODIURIL  TAKE 1 TABLET BY MOUTH ONCE DAILY   ipratropium 0.06 % nasal spray Commonly known as: ATROVENT  Place 2 sprays into both nostrils 4 (four) times daily.   losartan  100 MG tablet Commonly known as: COZAAR  TAKE 1 TABLET BY MOUTH EVERY MORNING   meclizine  25 MG tablet Commonly known as: ANTIVERT  Take 1 tablet (25 mg total) by mouth 3 (three) times daily as needed for dizziness.   metoprolol  succinate 100 MG 24 hr tablet Commonly known as: TOPROL -XL TAKE 1 TABLET BY MOUTH DAILY AT NOON   nitroGLYCERIN   0.4 MG SL tablet Commonly known as: NITROSTAT  Place 1 tablet (0.4 mg total) under the tongue every 5 (five) minutes as needed for chest pain.   pantoprazole  40 MG tablet Commonly known as: PROTONIX  TAKE 1 TABLET BY MOUTH ONCE DAILY   rosuvastatin  10 MG tablet Commonly known as: CRESTOR  TAKE 1 TABLET BY MOUTH EVERY DAY AT BEDTIME   sertraline  100 MG tablet Commonly known as: ZOLOFT  Take 100 mg by mouth daily.         REVIEW OF SYSTEMS: Pertinent positives and negatives discussed in HPI.   Objective:   Physical Exam: BP 118/80 (BP Location: Right Arm, Patient Position: Sitting, Cuff Size: Normal)   Pulse 65   Temp 98.2 F (36.8 C) (Temporal)   Resp 14   Ht 5' 6 (1.676 m)   Wt 218 lb 3.2 oz (99 kg)   SpO2 97%   BMI 35.22 kg/m  Body mass index is 35.22 kg/m. GEN: alert, well developed HEENT: clear conjunctiva, nose with + mild inferior turbinate hypertrophy, pink nasal mucosa, no rhinorrhea, + cobblestoning HEART: regular rate and rhythm, no murmur LUNGS: clear to auscultation bilaterally, no coughing, unlabored respiration ABDOMEN: soft, non distended  SKIN: no rashes or lesions  Assessment:   1. Other allergic rhinitis   2. Gastroesophageal reflux disease, unspecified whether esophagitis present     Plan/Recommendations:   Other Allergic Rhinitis: - Due to turbinate hypertrophy, seasonal symptoms and unresponsive to over the counter meds, will perform skin testing to identify aeroallergen triggers.   - Use nasal saline rinses before nose sprays such as with Neilmed Sinus Rinse.  Use distilled water .   - Use Flonase  2 sprays each nostril daily. Aim upward and outward. - Use Claritin 10mg  daily as needed for runny nose, sneezing, itchy watery eyes.  - Not an ideal shot candidate. Has CAD s/p PCI, beta blocker use, COPD.    GERD -Avoid lying down for at least two hours after a meal or after drinking acidic beverages, like soda, or other caffeinated beverages.  This can help to prevent stomach contents from flowing back into the esophagus. -Keep your head elevated while you sleep. Using an extra pillow  or two can also help to prevent reflux. -Eat smaller and more frequent meals each day instead of a few large meals. This promotes digestion and can aid in preventing heartburn. -Wear loose-fitting clothes to ease pressure on the stomach, which can worsen heartburn and reflux. -Reduce excess weight around the midsection. This can ease pressure on the stomach. Such pressure can force some stomach contents back up the esophagus.   Follow up: 9/2 at 130 for skin testing 1-55, IDs okay   Arleta Blanch, MD [facility] 

## 2024-03-27 NOTE — Patient Instructions (Addendum)
 Other Allergic Rhinitis:   - Use nasal saline rinses before nose sprays such as with Neilmed Sinus Rinse.  Use distilled water .   - Use Flonase  2 sprays each nostril daily. Aim upward and outward. - Use Claritin 10mg  daily as needed for runny nose, sneezing, itchy watery eyes.   GERD -Avoid lying down for at least two hours after a meal or after drinking acidic beverages, like soda, or other caffeinated beverages. This can help to prevent stomach contents from flowing back into the esophagus. -Keep your head elevated while you sleep. Using an extra pillow or two can also help to prevent reflux. -Eat smaller and more frequent meals each day instead of a few large meals. This promotes digestion and can aid in preventing heartburn. -Wear loose-fitting clothes to ease pressure on the stomach, which can worsen heartburn and reflux. -Reduce excess weight around the midsection. This can ease pressure on the stomach. Such pressure can force some stomach contents back up the esophagus.   Follow up: 9/2 at 130 for skin testing 1-55

## 2024-04-03 ENCOUNTER — Ambulatory Visit: Admitting: Internal Medicine

## 2024-04-03 DIAGNOSIS — J3089 Other allergic rhinitis: Secondary | ICD-10-CM | POA: Diagnosis not present

## 2024-04-03 MED ORDER — AZELASTINE HCL 0.1 % NA SOLN: 2.0000 | Freq: Two times a day (BID) | NASAL | 5 refills | Status: AC

## 2024-04-03 NOTE — Progress Notes (Signed)
 FOLLOW UP Date of Service/Encounter:  04/03/24   Subjective:  [name] (DOB: July 03, 1946) is a 78 y.o. female who [facility] for follow up for skin testing.   History obtained from: chart review and patient.  Anti histamines held.   Past Medical History: Past Medical History:  Diagnosis Date   Allergy     Angio-edema    Anxiety    C. difficile diarrhea    history of   CAD (coronary artery disease), native coronary artery    a. 05/2017 showed 100% mLCx tx with DES, otherwise 20% mRCA, 50% ramus, 30% dLAD, 20% prox-mid LAD, 20% mLAD treated medically.    Cancer [facility])    Right kidney CA removed.    CKD (chronic kidney disease) stage 3, GFR 30-59 ml/min (HCC) 10/11/2016   s/p R nephrectomy   Colon polyps 2008   HYPERPLASTIC   COPD (chronic obstructive pulmonary disease) (HCC)    former smoker   Depression    Gastritis    GERD (gastroesophageal reflux disease)    History of echocardiogram    Echo 3/18:  Moderate LVH, EF 60-65, grade 1 diastolic dysfunction, calcified aortic valve, mild MR, moderate LAE   History of nuclear stress test    Myoview  3/18: Mod size and intensity fixed septal defect, may be artifact.Opposite mod size and intensity lat defect, which is reversible and could represent ischemia or possibly artifact (SDS 4). LVEF 71% with normal wall motion. Intermediate risk study. >> images reviewed with Dr. Vina Gull - no sig ischemia; med rx    Hx of cardiovascular stress test    Lexiscan  Myoview  6/16:  EF 70%, no scar or ischemia; Low Risk   Hyperlipidemia    Hypertension    Myocardial infarction (HCC)    2018   Neuropathy    Orthostatic hypotension 05/28/2017   OSA (obstructive sleep apnea) 01/16/2021   Osteoarthritis    Pre-diabetes    no meds   Recurrent upper respiratory infection (URI)    Rheumatoid arthritis (HCC)    RA (Dr. Lenon) Bilateral hands   S/P angioplasty with stent 05/27/17 to LCX with DES   05/28/2017   Sleep apnea 2023   does not use CPAP   Thyroid  disease     Objective:  There were no vitals taken for this visit. There is no height or weight on file to calculate BMI. Physical Exam: GEN: alert, well developed HEENT: clear conjunctiva, MMM LUNGS: unlabored respiration  Skin Testing:  Skin prick testing was placed, which includes aeroallergens/foods, histamine control, and saline control.  Verbal consent was obtained prior to placing test.  Patient tolerated procedure well.  Allergy  testing results were read and interpreted by myself, documented by clinical staff. Adequate positive and negative control.  Positive results to:  Results discussed with patient/family.  Airborne Adult Perc - 04/03/24 1340     Time Antigen Placed 1341    Allergen Manufacturer Jestine    Location Back    Number of Test 55    1. Control-Buffer 50% Glycerol Negative    2. Control-Histamine 3+    3. Bahia Negative    4. French Southern Territories Negative    5. Johnson Negative    6. Kentucky  Blue Negative    7. Meadow Fescue Negative    8. Perennial Rye Negative    9. Timothy Negative    10. Ragweed Mix Negative    11. Cocklebur Negative    12. Plantain,  English  Negative    13. Baccharis Negative    14. Dog Fennel Negative    15. Russian Thistle Negative    16. Lamb's Quarters Negative    17. Sheep Sorrell Negative    18. Rough Pigweed Negative    19. Marsh Elder, Rough Negative    20. Mugwort, Common Negative    21. Box, Elder Negative    22. Cedar, red Negative    23. Sweet Gum Negative    24. Pecan Pollen Negative    25. Pine Mix Negative    26. Walnut, Black Pollen Negative    27. Red Mulberry Negative    28. Ash Mix Negative    29. Birch Mix Negative    30. Beech American Negative    31. Cottonwood, Guinea-Bissau Negative    32. Hickory, White Negative    33. Maple Mix Negative    34. Oak, [place] Mix Negative    35. Sycamore Eastern Negative    36. Alternaria Alternata Negative    37.  Cladosporium Herbarum Negative    38. Aspergillus Mix Negative    39. Penicillium Mix Negative    40. Bipolaris Sorokiniana (Helminthosporium) Negative    41. Drechslera Spicifera (Curvularia) Negative    42. Mucor Plumbeus Negative    43. Fusarium Moniliforme Negative    44. Aureobasidium Pullulans (pullulara) Negative    45. Rhizopus Oryzae Negative    46. Botrytis Cinera Negative    47. Epicoccum Nigrum Negative    48. Phoma Betae Negative    49. Dust Mite Mix Negative    50. Cat Hair 10,000 BAU/ml Negative    51.  Dog Epithelia Negative    52. Mixed Feathers Negative    53. Horse Epithelia Negative    54. Cockroach, German Negative    55. Tobacco Leaf Negative          Intradermal - 04/03/24 1358     Time Antigen Placed 1358    Allergen Manufacturer Jestine    Location Arm    Number of Test 16    Control Negative    Bahia Negative    French Southern Territories Negative    Johnson Negative    7 Grass Negative    Ragweed Mix Negative    Weed Mix Negative    Tree Mix Negative    Mold 1 Negative    Mold 2 Negative    Mold 3 Negative    Mold 4 Negative    Mite Mix Negative    Cat Negative    Dog Negative    Cockroach Negative           Assessment:   1. Other allergic rhinitis     Plan/Recommendations:  Other Allergic Rhinitis: - Due to turbinate hypertrophy, seasonal symptoms and unresponsive to over the counter meds, will perform skin testing to identify aeroallergen triggers.   - SPT 04/2024: positive to none - Use nasal saline rinses before nose sprays such as with Neilmed Sinus Rinse.  Use distilled water .   - Use Flonase  2 sprays each nostril daily. Aim upward and outward. - Use Azelastine  2 sprays each nostril twice daily.  Aim upward and outward.   - Use Claritin 10mg  daily as needed for runny nose, sneezing, itchy watery eyes.  - Not an ideal shot candidate. Has CAD s/p PCI, beta blocker use, COPD.   Dizziness - Follow up with PCP.  I am not convinced this is  related to rhinitis.   GERD -Avoid lying down for at  least two hours after a meal or after drinking acidic beverages, like soda, or other caffeinated beverages. This can help to prevent stomach contents from flowing back into the esophagus. -Keep your head elevated while you sleep. Using an extra pillow or two can also help to prevent reflux. -Eat smaller and more frequent meals each day instead of a few large meals. This promotes digestion and can aid in preventing heartburn. -Wear loose-fitting clothes to ease pressure on the stomach, which can worsen heartburn and reflux. -Reduce excess weight around the midsection. This can ease pressure on the stomach. Such pressure can force some stomach contents back up the esophagus.     Return in about 6 months (around 10/01/2024).  Arleta Blanch, MD [facility] 

## 2024-04-03 NOTE — Patient Instructions (Addendum)
 Chronic Rhinitis: - SPT 04/2024: positive to none - Use nasal saline rinses before nose sprays such as with Neilmed Sinus Rinse.  Use distilled water .   - Use Flonase  2 sprays each nostril daily. Aim upward and outward. - Use Azelastine  2 sprays each nostril twice daily.  Aim upward and outward.   - Use Claritin 10mg  daily as needed for runny nose, sneezing, itchy watery eyes.    Dizziness - Follow up with PCP.  I am not convinced this is related to rhinitis.   GERD -Avoid lying down for at least two hours after a meal or after drinking acidic beverages, like soda, or other caffeinated beverages. This can help to prevent stomach contents from flowing back into the esophagus. -Keep your head elevated while you sleep. Using an extra pillow or two can also help to prevent reflux. -Eat smaller and more frequent meals each day instead of a few large meals. This promotes digestion and can aid in preventing heartburn. -Wear loose-fitting clothes to ease pressure on the stomach, which can worsen heartburn and reflux. -Reduce excess weight around the midsection. This can ease pressure on the stomach. Such pressure can force some stomach contents back up the esophagus.

## 2024-04-04 ENCOUNTER — Encounter: Payer: Self-pay | Admitting: Family Medicine

## 2024-04-04 NOTE — Progress Notes (Signed)
 HPI F Smoker (1 pk/ week, 15 pkyrs followed for OSA, Bronchiectasis, complicated by  Orthostasis, HTN, CAD/ stent, Allergic Rhinitis, GERD,  NPSG 10/07/18- AHI 65.8/ hr, desaturation to 89%, body weight 209 lbs HST 02/09/21- AHI 50.5/ hr, desaturation to a nadir of 70%- average 87%, body weight 226 lbs PFT 05/04/21- Mild airtrapping but normal flows w/o resp to BD. DLCO is high for volume. HST 12/07/21- AHI 46.7/ hr, desaturation to 64%(mean 88%) body weight 226 lbs ===============================================================   04/21/23- 77 yoF former smoker (1 pk/ week, 10 pkyrs) followed for OSA, COPD/Bronchiectasis, complicated by  Orthostasis, HTN, CAD/ stent, Aortic Atherosclerosis, Allergic Rhinitis, GERD, Depression, Memory Disturbance, Hx R Nephrectomy/ Renal Cell Ca,  Son is [name] -Ventolin  hfa, Singulair ,Breztri , Flonase ,  CPAP auto 4-10/ [facility] Download-compliance -87%, AHI 4.9/hr Body weight today 230 lbs Download reviewed.  She is doing better with CPAP. Hosp August w negative w/u for chest pain, for cardiology f/u. Breztri  works pretty well and she gets manufacturer's assistance for it. Will get flu shot from her primary physician. Seeing a counselor for depression which is helpful. CXR 03/24/23- No evidence of acute cardiopulmonary disease.  04/05/24- 78 yoF former smoker (1 pk/ week, 10 pkyrs) followed for OSA, COPD/Bronchiectasis, complicated by  Orthostasis, HTN, CAD/ stent, Aortic Atherosclerosis, Allergic Rhinitis, GERD, Depression, Memory Disturbance, Hx R Nephrectomy/ Renal Cell Ca,  Son is [name] -Ventolin  hfa, Singulair ,Breztri , Flonase ,  CPAP auto 4-10/ [facility] Download-compliance - Body weight today 218 lbs Discussed the use of AI scribe software for clinical note transcription with the patient, who gave verbal consent to proceed.  History of Present Illness   [name] is an 78 year old female with sleep apnea who presents for follow-up  regarding CPAP use and medication management.  She had stopped using her CPAP machine recently upon death of her son,  but has resumed its use, resulting in improved sleep quality. Without the CPAP, she wakes up early in the morning. She is no longer receiving Breztri  due to income-related issues and is not using any spray medication for her chest. She is currently breathing adequately. We will try generic Symbicort .     Assessment and Plan:    Chronic obstructive pulmonary disease (COPD) COPD managed with Breztri , now switching to Symbicort  due to insurance coverage issues. - Prescribe generic Symbicort , two puffs twice a day, with mouth rinsing after use.  Obstructive sleep apnea Obstructive sleep apnea managed with CPAP, improving sleep quality. - Encourage continued use of CPAP machine for improved sleep quality.      ROS-see HPI   + = positive Constitutional:    weight loss, night sweats, fevers, chills, fatigue, lassitude. HEENT:    headaches, +difficulty swallowing, tooth/dental problems, sore throat,       sneezing, itching, ear ache, +nasal congestion, post nasal drip, snoring CV:    chest pain, orthopnea, PND, swelling in lower extremities, anasarca,      dizziness, palpitations Resp:   +shortness of breath with exertion or at rest.                productive cough,   non-productive cough, coughing up of blood.              change in color of mucus.  wheezing.   Skin:    rash or lesions. GI:  No-   heartburn, indigestion, abdominal pain, nausea, vomiting, diarrhea,                 change  in bowel habits, loss of appetite GU: dysuria, change in color of urine, no urgency or frequency.   flank pain. MS:   +joint pain, stiffness, decreased range of motion, back pain. Neuro-     +dizzy Psych:  change in mood or affect.  depression or anxiety.   memory loss.  OBJ- Physical Exam General- Alert, Oriented, Affect-appropriate, Distress- none acute, + obese Skin- +spots of  vitelligo Lymphadenopathy- none Head- atraumatic            Eyes- Gross vision intact, PERRLA, conjunctivae and secretions clear            Ears- Hearing, canals-normal            Nose- Clear, no-Septal dev, mucus, polyps, erosion, perforation             Throat- Mallampati III, mucosa clear , drainage- none, +tonsils, + teeth Neck- flexible , trachea midline, no stridor , thyroid  nl, carotid no bruit Chest - symmetrical excursion , unlabored           Heart/CV- RRR , no murmur , no gallop  , no rub, nl s1 s2                           - JVD- none , edema- none, stasis changes- none, varices- none           Lung- +clear, wheeze- none, cough- none , dullness-none, rub- none           Chest wall-  Abd-  Br/ Gen/ Rectal- Not done, not indicated Extrem- cyanosis- none, clubbing, none, atrophy- none, strength- nl Neuro- grossly intact to observation

## 2024-04-05 ENCOUNTER — Encounter: Payer: Self-pay | Admitting: Internal Medicine

## 2024-04-05 ENCOUNTER — Ambulatory Visit: Admitting: Internal Medicine

## 2024-04-05 VITALS — BP 132/84 | HR 58 | Temp 97.8°F | Ht 66.0 in | Wt 218.0 lb

## 2024-04-05 DIAGNOSIS — Z87891 Personal history of nicotine dependence: Secondary | ICD-10-CM

## 2024-04-05 DIAGNOSIS — G4733 Obstructive sleep apnea (adult) (pediatric): Secondary | ICD-10-CM

## 2024-04-05 DIAGNOSIS — J449 Chronic obstructive pulmonary disease, unspecified: Secondary | ICD-10-CM

## 2024-04-05 MED ORDER — BUDESONIDE-FORMOTEROL FUMARATE 160-4.5 MCG/ACT IN AERO: INHALATION_SPRAY | RESPIRATORY_TRACT | 12 refills | Status: DC

## 2024-04-05 NOTE — Patient Instructions (Signed)
 Script sent replacing Breztri  inhaler with Symbicort    inhale 2 puffs then rinse mouth, two times daily  I think you will feel better when you are back on your CPAP every night.

## 2024-04-10 DIAGNOSIS — Z008 Encounter for other general examination: Secondary | ICD-10-CM | POA: Diagnosis not present

## 2024-04-13 ENCOUNTER — Telehealth: Payer: Self-pay

## 2024-04-13 NOTE — Telephone Encounter (Unsigned)
 Copied from CRM [id number]. Topic: Clinical - Prescription Issue >> Apr 13, 2024 11:28 AM Nathanel DEL wrote: Reason for CRM: the inhaler budesonide -formoterol  (SYMBICORT ) 160-4.5 MCG/ACT inhaler Dr Neysa prescribed is over  $126.  Pt woulld like to know if any samples, or another inhaler comparable w/this?  Call [phone]

## 2024-04-16 ENCOUNTER — Other Ambulatory Visit (HOSPITAL_COMMUNITY): Payer: Self-pay

## 2024-04-16 NOTE — Progress Notes (Deleted)
  Cardiology Office Note   Date:  04/16/2024  ID:  ZOIEY Blackwell, DOB January 24, 1946, MRN [id number] PCP: [place], [name], MD  [facility] Providers Cardiologist:  Vina Gull, MD    History of Present Illness [name] is a 78 y.o. female with a past medical history of CAD, anxiety, depression, HTN, HLD, DM, CKD stage III with prior renal cancer s/p right nephrectomy, RA, chronic edema, OSA, neuropathy. Patient is followed by Dr. Gull and presents today for an annual follow up appointment   Patient previously underwent echocardiogram in 05/2017 that showed EF 65-70%, no regional wall motion abnormalities, grade I DD. Underwent LHC on 05/27/17 and was found to have mild non-obstructive disease in the LAD and RCA, moderate stenosis in the distal segment of the RI, CTO of the mid Cx. Underwent successful PTCA/DESx1 to the mid Cx.   Echocardiogram 08/2020 showed EF 60-65%, no regional wall motion abnormalities, grade I DD, normal RV systolic function, no significant valvular abnormalities. Stress test in 08/2020 showed no inducible ischemia. ABIs in 10/2020 were within normal range bilaterally.   More recently, stress test in 03/2023 was a low risk study. Findings consistent with infarction, no evidence of ischemia.   Patient was last seen by Dr. Gull on 06/27/23. At that time, patient was doing well. No anginal symptoms. BP well controlled   CAD  - Previously had DES to the mid Cx in 2018  - Most recent ischemic evaluation was a stress test in 03/2023 that showed evidence of infarction, no evidence of ischemia  -  - Continue ASA 81 mg daily  - Continue metoprolol  succinate 100 mg daily  - Continue crestor  10 mg daily   HTN  -  - Continue hydrochlorothiazide  25 mg daily, losartan  100 mg daily, metoprolol  succinate 100 mg daily  - K 3.6, creatinine 1.78 in 11/2023 - Ordered BMP. If creatinine continues to climb, will stop hydrochlorothiazide    HLD  - Lipid panel  - Continue crestor  10 mg  daily   ROS: ***  Studies Reviewed      *** Risk Assessment/Calculations {Does this patient have ATRIAL FIBRILLATION?:[phone]} No BP recorded.  {Refresh Note OR Click here to enter BP  :1}***       Physical Exam VS:  There were no vitals taken for this visit.       Wt Readings from Last 3 Encounters:  04/05/24 218 lb (98.9 kg)  03/27/24 218 lb 3.2 oz (99 kg)  03/23/24 219 lb 6 oz (99.5 kg)    GEN: Well nourished, well developed in no acute distress NECK: No JVD; No carotid bruits CARDIAC: ***RRR, no murmurs, rubs, gallops RESPIRATORY:  Clear to auscultation without rales, wheezing or rhonchi  ABDOMEN: Soft, non-tender, non-distended EXTREMITIES:  No edema; No deformity   ASSESSMENT AND PLAN ***    {Are you ordering a CV Procedure (e.g. stress test, cath, DCCV, TEE, etc)?   Press F2        :[phone]}  Dispo: ***  Signed, Rollo FABIENE Louder, PA-C

## 2024-04-19 ENCOUNTER — Ambulatory Visit: Admitting: Psychology

## 2024-04-19 DIAGNOSIS — F330 Major depressive disorder, recurrent, mild: Secondary | ICD-10-CM

## 2024-04-19 NOTE — Progress Notes (Signed)
 [facility] Counselor/Therapist Progress Note  Patient ID: [name], MRN: [id number],    Date: 04/19/2024  Time Spent: 1:30pm-2:34pm  pt is seen for an in person visit.     Treatment Type: Individual Therapy  Reported Symptoms: pt reports some grief. Pt reports some positive interactions and engagement that feels good about.  Mental Status Exam: Appearance:  Well Groomed     Behavior: Appropriate  Motor: Normal  Speech/Language:  Normal Rate  Affect: Appropriate  Mood: depressed  Thought process: normal  Thought content:   WNL  Sensory/Perceptual disturbances:   WNL  Orientation: oriented to person, place, time/date, and situation  Attention: Good  Concentration: Good  Memory: WNL  Fund of knowledge:  Good  Insight:   Good  Judgment:  Good  Impulse Control: Good   Risk Assessment: Danger to Self:  No Self-injurious Behavior: No Danger to Others: No Duty to Warn:no Physical Aggression / Violence:No  Access to Firearms a concern: No  Gang Involvement:No   Subjective: Counselor assessed pt current functioning per pt report. Processed w/pt moods and grief.  Validated and normalized pt grief feelings missing son, and son's cat.  Assisted pt w/ recognizing ways she is honoring him.  Discussed upcoming holidays and planning for.  Reflected positive of engagement and positive interactions w/ friends.  Pt affect wnl.  Pt reports she is missing her son and feelings of grief expressed.  Pt also reports that had to put down his cat last week.  Pt reflected on positive memories and that honoring him w/ attending memorial services upcoming.  Pt aware that holidays as will be difficult and that her and daughter are planning to go to beach and recognize this place he enjoyed.  Pt reports she is talking w/ her friends more and this is positive.  Pt also reports able ot have small talk w others and enjoys.     Interventions: supportive  Diagnosis:Mild episode of recurrent  major depressive disorder (HCC)  Plan: pt to f/u in 3-4 weeks for counseling.  Pt to f/u as scheduled w/ PCP and psychiatrist as scheduled.      Individualized Treatment Plan Strengths: enjoys time w/ daughter, watching her t.v shows.  Baking/cooking at times.    Supports: her daughter, her cousin   Goal/Needs for Treatment:  In order of importance to patient 1) continue engagement in activities/ talk w/ friends 2) decreased depressed moods 3) work through Therapist, art of Needs: continue to have support to cope w/ depression and grieve     Treatment Level:out pt counseling  Symptoms:depressed mood, tearfulness, loss of interest, difficulty w/ motivation, withdrawn from interacting.   Client Treatment Preferences:biweekly to monthly in person counseling.  Continue medication management w/ psychiatrist.    Healthcare consumer's goal for treatment:  Counselor, [name], [facility] will support the patient's ability to achieve the goals identified. Cognitive Behavioral Therapy, Assertive Communication/Conflict Resolution Training, Relaxation Training, ACT, Humanistic and other evidenced-based practices will be used to promote progress towards healthy functioning.   Healthcare consumer will: Actively participate in therapy, working towards healthy functioning.    *Justification for Continuation/Discontinuation of Goal: R=Revised, O=Ongoing, A=Achieved, D=Discontinued  Goal 1)  Increased engagement w/ activities she enjoys and w/ social interactions AEB pt report and counselor observation. Baseline date 12/29/23: Progress towards goal 50; How Often - Daily Target Date Goal Was reviewed Status Code Progress towards goal/Likert rating  12/28/24  Goal 2) Increased coping w/ losses and w/ depressed moods AEB utilizing coping skills daily.   Baseline date 12/29/23: Progress towards goal 0; How Often - Daily Target Date Goal Was reviewed Status Code Progress towards  goal  12/28/24                  This plan has been reviewed and created by the following participants:  This plan will be reviewed at least every 12 months. Date Behavioral Health Clinician Date Guardian/Patient   12/29/23  [facility] 12/29/23 Verbal Consent Provided and electronic signature requested                             [name] [name]

## 2024-04-23 ENCOUNTER — Ambulatory Visit
Admission: RE | Admit: 2024-04-23 | Discharge: 2024-04-23 | Disposition: A | Discharge status: Home / Self Care | Source: Ambulatory Visit | Attending: Family Medicine | Admitting: Family Medicine

## 2024-04-23 ENCOUNTER — Ambulatory Visit: Payer: Medicare HMO | Admitting: Internal Medicine

## 2024-04-23 ENCOUNTER — Ambulatory Visit: Admitting: Cardiology

## 2024-04-23 VITALS — BP 129/76 | HR 68 | Temp 98.8°F | Resp 18

## 2024-04-23 DIAGNOSIS — Z1152 Encounter for screening for COVID-19: Secondary | ICD-10-CM

## 2024-04-23 DIAGNOSIS — U071 COVID-19: Secondary | ICD-10-CM | POA: Diagnosis not present

## 2024-04-23 DIAGNOSIS — N184 Chronic kidney disease, stage 4 (severe): Secondary | ICD-10-CM | POA: Diagnosis not present

## 2024-04-23 DIAGNOSIS — J449 Chronic obstructive pulmonary disease, unspecified: Secondary | ICD-10-CM

## 2024-04-23 DIAGNOSIS — E1122 Type 2 diabetes mellitus with diabetic chronic kidney disease: Secondary | ICD-10-CM | POA: Diagnosis not present

## 2024-04-23 LAB — POC SOFIA SARS ANTIGEN FIA: SARS Coronavirus 2 Ag: POSITIVE — AB

## 2024-04-23 MED ORDER — PREDNISONE 20 MG PO TABS
ORAL_TABLET | ORAL | 0 refills | Status: DC
Start: 2024-04-23 — End: 2024-05-03

## 2024-04-23 MED ORDER — PROMETHAZINE-DM 6.25-15 MG/5ML PO SYRP: 5.0000 mL | ORAL_SOLUTION | Freq: Every evening | ORAL | 0 refills | Status: DC | PRN

## 2024-04-23 MED ORDER — BENZONATATE 100 MG PO CAPS: 100.0000 mg | ORAL_CAPSULE | Freq: Three times a day (TID) | ORAL | 0 refills | Status: DC | PRN

## 2024-04-23 NOTE — ED Provider Notes (Signed)
 [facility]RG[facility]  Note:  This document was prepared using Conservation officer, historic buildings and may include unintentional dictation errors.  MRN: [id number] DOB: 17-Nov-1945  Subjective:   [name] is a 78 y.o. female with CKD IV, COPD, DM presenting for COVID testing.  Had exposure through her daughter who tested +2 days ago.  Patient has had chest congestion, runny nose and coughing.  Last A1c was 6.1% 03/23/2024.  No chest pain, shortness of breath or wheezing.  No current facility-administered medications for this encounter.  Current Outpatient Medications:    acetaminophen  (TYLENOL ) 500 MG tablet, Take 1,000 mg by mouth as needed for moderate pain or headache. , Disp: , Rfl:    Aspirin  81 MG CAPS, Take 81 mg by mouth daily., Disp: , Rfl:    azelastine  (ASTELIN ) 0.1 % nasal spray, Place 2 sprays into both nostrils 2 (two) times daily. Use in each nostril as directed, Disp: 30 mL, Rfl: 5   Budeson-Glycopyrrol-Formoterol  (BREZTRI  AEROSPHERE) 160-9-4.8 MCG/ACT AERO, Inhale 2 puffs into the lungs in the morning and at bedtime., Disp: 2 each, Rfl: 11   budesonide -formoterol  (SYMBICORT ) 160-4.5 MCG/ACT inhaler, Inhale 2 puffs then rinse mouth, twice daily, Disp: 1 each, Rfl: 12   famotidine  (PEPCID ) 20 MG tablet, TAKE 1 TABLET BY MOUTH EVERY MORNING AND 1 TABLET BY MOUTH EVERY DAY AT BEDTIME (Patient not taking: Reported on 04/05/2024), Disp: 180 tablet, Rfl: 2   fluticasone  (FLONASE ) 50 MCG/ACT nasal spray, PLACE 2 SPRAYS INTO BOTH NOSTRILS AT BEDTIME AS NEEDED FOR ALLERGIES., Disp: 48 mL, Rfl: 1   hydrochlorothiazide  (HYDRODIURIL ) 25 MG tablet, TAKE 1 TABLET BY MOUTH ONCE DAILY, Disp: 90 tablet, Rfl: 2   ipratropium (ATROVENT ) 0.06 % nasal spray, Place 2 sprays into both nostrils 4 (four) times daily., Disp: 15 mL, Rfl: 1   losartan  (COZAAR ) 100 MG tablet, TAKE 1 TABLET BY MOUTH EVERY MORNING, Disp: 90 tablet, Rfl: 2   meclizine  (ANTIVERT ) 25 MG tablet, Take 1 tablet (25  mg total) by mouth 3 (three) times daily as needed for dizziness., Disp: 30 tablet, Rfl: 0   metoprolol  succinate (TOPROL -XL) 100 MG 24 hr tablet, TAKE 1 TABLET BY MOUTH DAILY AT NOON, Disp: 90 tablet, Rfl: 2   nitroGLYCERIN  (NITROSTAT ) 0.4 MG SL tablet, Place 1 tablet (0.4 mg total) under the tongue every 5 (five) minutes as needed for chest pain., Disp: 25 tablet, Rfl: 2   pantoprazole  (PROTONIX ) 40 MG tablet, TAKE 1 TABLET BY MOUTH ONCE DAILY, Disp: 30 tablet, Rfl: 9   rosuvastatin  (CRESTOR ) 10 MG tablet, TAKE 1 TABLET BY MOUTH EVERY DAY AT BEDTIME, Disp: 90 tablet, Rfl: 1   sertraline  (ZOLOFT ) 100 MG tablet, Take 100 mg by mouth daily., Disp: , Rfl:    Allergies  Allergen Reactions   Oxycodone -Acetaminophen  Hives and Itching    hallucinations   Percocet [Oxycodone -Acetaminophen ] Hives, Itching and Other (See Comments)    hallucinations   Pravastatin  Sodium Other (See Comments)    Muscle aches Muscle aches   Hydrocodone  Other (See Comments)    crazy dreams   Aspirin  Other (See Comments)    GI upset Other reaction(s): Other (See Comments) REACTION: GI upset   Lexapro [Escitalopram] Itching    Pt reports caused itching and breaking out.       Past Medical History:  Diagnosis Date   Allergy     Angio-edema    Anxiety    C. difficile diarrhea    history of   CAD (coronary  artery disease), native coronary artery    a. 05/2017 showed 100% mLCx tx with DES, otherwise 20% mRCA, 50% ramus, 30% dLAD, 20% prox-mid LAD, 20% mLAD treated medically.    Cancer [facility])    Right kidney CA removed.    CKD (chronic kidney disease) stage 3, GFR 30-59 ml/min (HCC) 10/11/2016   s/p R nephrectomy   Colon polyps 2008   HYPERPLASTIC   COPD (chronic obstructive pulmonary disease) (HCC)    former smoker   Depression    Gastritis    GERD (gastroesophageal reflux disease)    History of echocardiogram    Echo 3/18:  Moderate LVH, EF 60-65, grade 1 diastolic dysfunction, calcified aortic valve,  mild MR, moderate LAE   History of nuclear stress test    Myoview  3/18: Mod size and intensity fixed septal defect, may be artifact.Opposite mod size and intensity lat defect, which is reversible and could represent ischemia or possibly artifact (SDS 4). LVEF 71% with normal wall motion. Intermediate risk study. >> images reviewed with Dr. Vina Gull - no sig ischemia; med rx    Hx of cardiovascular stress test    Lexiscan  Myoview  6/16:  EF 70%, no scar or ischemia; Low Risk   Hyperlipidemia    Hypertension    Myocardial infarction (HCC)    2018   Neuropathy    Orthostatic hypotension 05/28/2017   OSA (obstructive sleep apnea) 01/16/2021   Osteoarthritis    Pre-diabetes    no meds   Recurrent upper respiratory infection (URI)    Rheumatoid arthritis (HCC)    RA (Dr. Lenon) Bilateral hands   S/P angioplasty with stent 05/27/17 to LCX with DES  05/28/2017   Sleep apnea 2023   does not use CPAP   Thyroid  disease      Past Surgical History:  Procedure Laterality Date   ABDOMINAL HYSTERECTOMY  1978   ANTERIOR CERVICAL DECOMP/DISCECTOMY FUSION N/A 05/29/2018   Procedure: ANTERIOR CERVICAL DECOMPRESSION FUSION - CERVICAL FIVE-CERVICAL SIX - CERVICAL SIX-CERVICAL SEVEN;  Surgeon: Louis Shove, MD;  Location: [facility] OR;  Service: Neurosurgery;  Laterality: N/A;   BACK SURGERY     x 2   BREAST LUMPECTOMY WITH RADIOACTIVE SEED LOCALIZATION Right 04/13/2022   Procedure: RIGHT BREAST SEED LUMPECTOMY;  Surgeon: Vanderbilt Ned, MD;  Location: [facility];  Service: General;  Laterality: Right;   CARPAL TUNNEL RELEASE Left    COLONOSCOPY     COLONOSCOPY W/ BIOPSIES AND POLYPECTOMY     Hx: of   CORONARY STENT INTERVENTION N/A 05/27/2017   Procedure: CORONARY STENT INTERVENTION;  Surgeon: Verlin Lonni BIRCH, MD;  Location: [facility] INVAS[facility];  Service: Cardiovascular;  Laterality: N/A;   ESOPHAGOGASTRODUODENOSCOPY     HNP     LEFT HEART CATH AND CORONARY ANGIOGRAPHY N/A  05/27/2017   Procedure: LEFT HEART CATH AND CORONARY ANGIOGRAPHY;  Surgeon: Verlin Lonni BIRCH, MD;  Location: [facility];  Service: Cardiovascular;  Laterality: N/A;   LUMBAR LAMINECTOMY/DECOMPRESSION MICRODISCECTOMY Left 02/12/2013   Procedure: LUMBAR TWO THREE, LUMBAR THREE FOUR, LUMBAR FOUR FIVE  LAMINECTOMY/DECOMPRESSION MICRODISCECTOMY 3 LEVELS;  Surgeon: Shove DELENA Louis, MD;  Location: [facility] NEURO ORS;  Service: Neurosurgery;  Laterality: Left;   NEPHRECTOMY Right 2010   10.rcc cancer   TOTAL KNEE ARTHROPLASTY Right    Redo   UPPER GASTROINTESTINAL ENDOSCOPY      Family History  Problem Relation Age of Onset   Rheum arthritis Mother    Stroke Mother    Prostate cancer  Father    Heart disease Father    Diabetes Brother    Dementia Brother    Parkinsonism Brother    Diabetes Brother    Cancer Brother    Dementia Brother    Kidney disease Son    Bipolar disorder Daughter    Colon cancer Neg Hx    Esophageal cancer Neg Hx    Pancreatic cancer Neg Hx    Liver disease Neg Hx    Rectal cancer Neg Hx    Stomach cancer Neg Hx     Social History   Tobacco Use   Smoking status: Former    Current packs/day: 0.00    Average packs/day: 0.2 packs/day for 53.1 years (10.6 ttl pk-yrs)    Types: Cigarettes    Start date: 21    Quit date: 09/07/2021    Years since quitting: 2.6   Smokeless tobacco: Never   Tobacco comments:    pt has been an on and off smoker since age 56  Vaping Use   Vaping status: Never Used  Substance Use Topics   Alcohol use: No    Alcohol/week: 0.0 standard drinks of alcohol   Drug use: No    ROS   Objective:   Vitals: BP 129/76 (BP Location: Right Arm)   Pulse 68   Temp 98.8 F (37.1 C) (Oral)   Resp 18   SpO2 92%   Physical Exam Constitutional:      General: She is not in acute distress.    Appearance: Normal appearance. She is well-developed. She is not ill-appearing, toxic-appearing or diaphoretic.  HENT:     Head:  Normocephalic and atraumatic.     Nose: Nose normal.     Mouth/Throat:     Mouth: Mucous membranes are moist.  Eyes:     General: No scleral icterus.       Right eye: No discharge.        Left eye: No discharge.     Extraocular Movements: Extraocular movements intact.  Cardiovascular:     Rate and Rhythm: Normal rate and regular rhythm.     Heart sounds: Normal heart sounds. No murmur heard.    No friction rub. No gallop.  Pulmonary:     Effort: Pulmonary effort is normal. No respiratory distress.     Breath sounds: No stridor. No wheezing, rhonchi or rales.  Chest:     Chest wall: No tenderness.  Skin:    General: Skin is warm and dry.  Neurological:     General: No focal deficit present.     Mental Status: She is alert and oriented to person, place, and time.  Psychiatric:        Mood and Affect: Mood normal.        Behavior: Behavior normal.     Results for orders placed or performed during the hospital encounter of 04/23/24 (from the past 24 hours)  POC SARS Coronavirus 2 Ag     Status: Abnormal   Collection Time: 04/23/24  4:13 PM  Result Value Ref Range   SARS Coronavirus 2 Ag Positive (A) Negative   *Note: Due to a large number of results and/or encounters for the requested time period, some results have not been displayed. A complete set of results can be found in Results Review.    Assessment and Plan :   PDMP not reviewed this encounter.  1. COVID-19 virus infection   2. Encounter for screening laboratory testing for COVID-19 virus   3.  Chronic obstructive pulmonary disease, unspecified COPD type (HCC)   4. Type 2 diabetes mellitus with stage 4 chronic kidney disease, without long-term current use of insulin  (HCC)    Patient is not a good candidate for Paxlovid  given her kidney function.  Molnupiravir is not covered.  Offered an oral prednisone  course in the context of her COPD and respiratory symptoms.  Use supportive care otherwise. Deferred imaging given  clear cardiopulmonary exam, hemodynamically stable vital signs.  Counseled patient on potential for adverse effects with medications prescribed/recommended today, ER and return-to-clinic precautions discussed, patient verbalized understanding.    Christopher Savannah, [place] 04/23/24 [phone]

## 2024-04-23 NOTE — ED Triage Notes (Signed)
 Pt reports cough, runny nose and chest congestion x 1 day. Her daughter tested positive fro COVID 2 days ago (lives in same household). Tylenol  gives some relief.   Pt reports she has COPD.

## 2024-04-26 ENCOUNTER — Ambulatory Visit: Admitting: Physician Assistant

## 2024-04-30 ENCOUNTER — Other Ambulatory Visit: Payer: Self-pay | Admitting: Family Medicine

## 2024-05-01 ENCOUNTER — Other Ambulatory Visit: Payer: Self-pay

## 2024-05-01 ENCOUNTER — Ambulatory Visit (INDEPENDENT_AMBULATORY_CARE_PROVIDER_SITE_OTHER)

## 2024-05-01 ENCOUNTER — Ambulatory Visit: Payer: Self-pay | Admitting: Nurse Practitioner

## 2024-05-01 ENCOUNTER — Ambulatory Visit
Admission: RE | Admit: 2024-05-01 | Discharge: 2024-05-01 | Disposition: A | Discharge status: Home / Self Care | Source: Ambulatory Visit | Attending: Family Medicine | Admitting: Family Medicine

## 2024-05-01 ENCOUNTER — Telehealth: Payer: Self-pay

## 2024-05-01 VITALS — BP 108/73 | HR 68 | Temp 98.3°F | Resp 17

## 2024-05-01 DIAGNOSIS — U071 COVID-19: Secondary | ICD-10-CM

## 2024-05-01 DIAGNOSIS — J1282 Pneumonia due to coronavirus disease 2019: Secondary | ICD-10-CM | POA: Diagnosis not present

## 2024-05-01 DIAGNOSIS — J449 Chronic obstructive pulmonary disease, unspecified: Secondary | ICD-10-CM | POA: Diagnosis not present

## 2024-05-01 DIAGNOSIS — J441 Chronic obstructive pulmonary disease with (acute) exacerbation: Secondary | ICD-10-CM | POA: Diagnosis not present

## 2024-05-01 DIAGNOSIS — R051 Acute cough: Secondary | ICD-10-CM

## 2024-05-01 MED ORDER — FLUTICASONE-SALMETEROL 115-21 MCG/ACT IN AERO: 2.0000 | INHALATION_SPRAY | Freq: Two times a day (BID) | RESPIRATORY_TRACT | 0 refills | Status: AC

## 2024-05-01 MED ORDER — AMOXICILLIN-POT CLAVULANATE 875-125 MG PO TABS: 1.0000 | ORAL_TABLET | Freq: Two times a day (BID) | ORAL | 0 refills | Status: DC

## 2024-05-01 NOTE — ED Provider Notes (Signed)
 [facility]    CSN: [id number] Arrival date & time: 05/01/24  1346      History   Chief Complaint No chief complaint on file.   HPI [name] is a 78 y.o. female  presents for evaluation of URI symptoms for 8 days.  Patient was seen in urgent care on 9/22 and tested positive for COVID-19.  She was started on benzonatate , prednisone  and Promethazine  DM.  Due to her kidney function she was not a candidate for Paxlovid  and apparently molnupiravir was not covered.  Patient states her cough has worsened and does endorse productive cough with shortness of breath.  Also states last night she had a fever of 101.  She states she only has an albuterol  inhaler but was sent a different 1 at her last visit but insurance did not cover it.  She does not wear oxygen at home.  States she has not had COVID in the past.  Denies hospitalizations for COPD in the past year.  No other concerns at this time  HPI  Past Medical History:  Diagnosis Date   Allergy     Angio-edema    Anxiety    C. difficile diarrhea    history of   CAD (coronary artery disease), native coronary artery    a. 05/2017 showed 100% mLCx tx with DES, otherwise 20% mRCA, 50% ramus, 30% dLAD, 20% prox-mid LAD, 20% mLAD treated medically.    Cancer [facility])    Right kidney CA removed.    CKD (chronic kidney disease) stage 3, GFR 30-59 ml/min (HCC) 10/11/2016   s/p R nephrectomy   Colon polyps 2008   HYPERPLASTIC   COPD (chronic obstructive pulmonary disease) (HCC)    former smoker   Depression    Gastritis    GERD (gastroesophageal reflux disease)    History of echocardiogram    Echo 3/18:  Moderate LVH, EF 60-65, grade 1 diastolic dysfunction, calcified aortic valve, mild MR, moderate LAE   History of nuclear stress test    Myoview  3/18: Mod size and intensity fixed septal defect, may be artifact.Opposite mod size and intensity lat defect, which is reversible and could represent ischemia or possibly artifact (SDS 4).  LVEF 71% with normal wall motion. Intermediate risk study. >> images reviewed with Dr. Vina Gull - no sig ischemia; med rx    Hx of cardiovascular stress test    Lexiscan  Myoview  6/16:  EF 70%, no scar or ischemia; Low Risk   Hyperlipidemia    Hypertension    Myocardial infarction (HCC)    2018   Neuropathy    Orthostatic hypotension 05/28/2017   OSA (obstructive sleep apnea) 01/16/2021   Osteoarthritis    Pre-diabetes    no meds   Recurrent upper respiratory infection (URI)    Rheumatoid arthritis (HCC)    RA (Dr. Lenon) Bilateral hands   S/P angioplasty with stent 05/27/17 to LCX with DES  05/28/2017   Sleep apnea 2023   does not use CPAP   Thyroid  disease     Patient Active Problem List   Diagnosis Date Noted   Dizziness 01/11/2024   Adrenal adenoma, left 07/09/2023   Atherosclerosis of aorta 07/08/2023   Local reaction to COVID-19 vaccine 10/12/2022   Moderate episode of recurrent major depressive disorder (HCC) 03/24/2022   PAD (peripheral artery disease) 11/28/2021   OSA (obstructive sleep apnea) 01/16/2021   COPD mixed type (HCC) 01/16/2021   Fatigue 11/03/2020   B12 deficiency 11/03/2020   Chest pain  08/30/2020   Personal history of malignant neoplasm of kidney 09/26/2019   Vitamin D  deficiency, unspecified 09/26/2019   Anxiety disorder, unspecified 03/26/2019   Tobacco use disorder 09/29/2018   Cervical myelopathy (HCC) 05/29/2018   ANA positive 12/08/2017   Cervicalgia 09/16/2017   Carpal tunnel syndrome of left wrist 07/21/2017   Chondrocalcinosis 07/21/2017   Neuropathy 07/21/2017   Orthostatic hypotension 05/28/2017   S/P angioplasty with stent 05/27/17 to LCX with DES  05/28/2017   CAD (coronary artery disease)    Polyneuropathy 05/25/2017   Chest pain with moderate risk for cardiac etiology 05/25/2017   Class 1 obesity with serious comorbidity and body mass index (BMI) of 34.0 to 34.9 in adult 12/06/2016   CKD (chronic kidney disease), stage IV  (HCC) 10/11/2016   Hot flashes, menopausal 10/01/2016   Chest pain with moderate risk of acute coronary syndrome 10/25/2014   Degenerative spondylolisthesis 10/15/2014   Spinal stenosis, lumbar region, with neurogenic claudication 02/12/2013   DM (diabetes mellitus), type 2 with renal complications (HCC) 03/05/2010   History of Rtr nephrectomy 2010, secondary to renal cell cancer 01/14/2009   History of colonic polyps 11/14/2008   Hyperlipidemia associated with type 2 diabetes mellitus (HCC) 11/16/2007   ESOPHAGEAL STRICTURE 10/12/2007   Hypertension with heart disease 01/19/2007   Allergic rhinitis 01/19/2007   GERD 01/19/2007    Past Surgical History:  Procedure Laterality Date   ABDOMINAL HYSTERECTOMY  1978   ANTERIOR CERVICAL DECOMP/DISCECTOMY FUSION N/A 05/29/2018   Procedure: ANTERIOR CERVICAL DECOMPRESSION FUSION - CERVICAL FIVE-CERVICAL SIX - CERVICAL SIX-CERVICAL SEVEN;  Surgeon: Louis Shove, MD;  Location: [facility] OR;  Service: Neurosurgery;  Laterality: N/A;   BACK SURGERY     x 2   BREAST LUMPECTOMY WITH RADIOACTIVE SEED LOCALIZATION Right 04/13/2022   Procedure: RIGHT BREAST SEED LUMPECTOMY;  Surgeon: Vanderbilt Ned, MD;  Location: [facility];  Service: General;  Laterality: Right;   CARPAL TUNNEL RELEASE Left    COLONOSCOPY     COLONOSCOPY W/ BIOPSIES AND POLYPECTOMY     Hx: of   CORONARY STENT INTERVENTION N/A 05/27/2017   Procedure: CORONARY STENT INTERVENTION;  Surgeon: Verlin Lonni BIRCH, MD;  Location: [facility];  Service: Cardiovascular;  Laterality: N/A;   ESOPHAGOGASTRODUODENOSCOPY     HNP     LEFT HEART CATH AND CORONARY ANGIOGRAPHY N/A 05/27/2017   Procedure: LEFT HEART CATH AND CORONARY ANGIOGRAPHY;  Surgeon: Verlin Lonni BIRCH, MD;  Location: [facility];  Service: Cardiovascular;  Laterality: N/A;   LUMBAR LAMINECTOMY/DECOMPRESSION MICRODISCECTOMY Left 02/12/2013   Procedure: LUMBAR TWO THREE, LUMBAR THREE FOUR, LUMBAR  FOUR FIVE  LAMINECTOMY/DECOMPRESSION MICRODISCECTOMY 3 LEVELS;  Surgeon: Shove DELENA Louis, MD;  Location: [facility];  Service: Neurosurgery;  Laterality: Left;   NEPHRECTOMY Right 2010   10.rcc cancer   TOTAL KNEE ARTHROPLASTY Right    Redo   UPPER GASTROINTESTINAL ENDOSCOPY      OB History   No obstetric history on file.      Home Medications    Prior to Admission medications   Medication Sig Start Date End Date Taking? Authorizing Provider  amoxicillin -clavulanate (AUGMENTIN ) 875-125 MG tablet Take 1 tablet by mouth every 12 (twelve) hours. 05/01/24  Yes Damyon Mullane, Jodi R, NP  fluticasone -salmeterol (ADVAIR HFA) 115-21 MCG/ACT inhaler Inhale 2 puffs into the lungs 2 (two) times daily. 05/01/24  Yes Keimani Laufer, Jodi R, NP  acetaminophen  (TYLENOL ) 500 MG tablet Take 1,000 mg by mouth as needed for moderate pain or headache.  [provider]  Aspirin  81 MG CAPS Take 81 mg by mouth daily.    [provider]  azelastine  (ASTELIN ) 0.1 % nasal spray Place 2 sprays into both nostrils 2 (two) times daily. Use in each nostril as directed 04/03/24   Tobie Arleta SQUIBB, MD  benzonatate  (TESSALON ) 100 MG capsule Take 1 capsule (100 mg total) by mouth 3 (three) times daily as needed for cough. 04/23/24   Christopher Savannah, PA-C  famotidine  (PEPCID ) 20 MG tablet TAKE ONE (1) TABLET BY MOUTH TWICE DAILY IN THE MORNING AND AT BEDTIME 05/01/24   [place], [name], MD  fluticasone  (FLONASE ) 50 MCG/ACT nasal spray PLACE 2 SPRAYS INTO BOTH NOSTRILS AT BEDTIME AS NEEDED FOR ALLERGIES. 12/12/23   [place], [name], MD  hydrochlorothiazide  (HYDRODIURIL ) 25 MG tablet TAKE 1 TABLET BY MOUTH ONCE DAILY 03/02/24   [place], [name], MD  ipratropium (ATROVENT ) 0.06 % nasal spray Place 2 sprays into both nostrils 4 (four) times daily. 01/11/24   [place], [name], MD  losartan  (COZAAR ) 100 MG tablet TAKE 1 TABLET BY MOUTH EVERY MORNING 03/02/24   [place], [name], MD  meclizine  (ANTIVERT ) 25 MG tablet Take 1 tablet (25 mg total) by  mouth 3 (three) times daily as needed for dizziness. 09/10/23   Bero, Michael M, MD  metoprolol  succinate (TOPROL -XL) 100 MG 24 hr tablet TAKE 1 TABLET BY MOUTH DAILY AT NOON 03/02/24   [place], [name], MD  nitroGLYCERIN  (NITROSTAT ) 0.4 MG SL tablet Place 1 tablet (0.4 mg total) under the tongue every 5 (five) minutes as needed for chest pain. 05/26/21   Okey Vina GAILS, MD  pantoprazole  (PROTONIX ) 40 MG tablet TAKE 1 TABLET BY MOUTH ONCE DAILY 09/05/23   Cirigliano, Vito V, DO  predniSONE  (DELTASONE ) 20 MG tablet Take 2 tablets daily with breakfast. 04/23/24   Christopher Savannah, PA-C  promethazine -dextromethorphan (PROMETHAZINE -DM) 6.25-15 MG/5ML syrup Take 5 mLs by mouth at bedtime as needed for cough. 04/23/24   Christopher Savannah, PA-C  rosuvastatin  (CRESTOR ) 10 MG tablet TAKE 1 TABLET BY MOUTH EVERY DAY AT BEDTIME 12/02/23   Okey Vina GAILS, MD  sertraline  (ZOLOFT ) 100 MG tablet Take 100 mg by mouth daily. 06/29/23   [provider]    Family History Family History  Problem Relation Age of Onset   Rheum arthritis Mother    Stroke Mother    Prostate cancer Father    Heart disease Father    Diabetes Brother    Dementia Brother    Parkinsonism Brother    Diabetes Brother    Cancer Brother    Dementia Brother    Kidney disease Son    Bipolar disorder Daughter    Colon cancer Neg Hx    Esophageal cancer Neg Hx    Pancreatic cancer Neg Hx    Liver disease Neg Hx    Rectal cancer Neg Hx    Stomach cancer Neg Hx     Social History Social History   Tobacco Use   Smoking status: Former    Current packs/day: 0.00    Average packs/day: 0.2 packs/day for 53.1 years (10.6 ttl pk-yrs)    Types: Cigarettes    Start date: 60    Quit date: 09/07/2021    Years since quitting: 2.6   Smokeless tobacco: Never   Tobacco comments:    pt has been an on and off smoker since age 53  Vaping Use   Vaping status: Never Used  Substance Use Topics   Alcohol  use: No    Alcohol/week: 0.0 standard drinks of  alcohol   Drug use: No     Allergies   Oxycodone -acetaminophen , Percocet [oxycodone -acetaminophen ], Pravastatin  sodium, Hydrocodone , Aspirin , and Lexapro [escitalopram]   Review of Systems Review of Systems  Constitutional:  Positive for fever.  HENT:  Positive for congestion.   Respiratory:  Positive for cough and shortness of breath.      Physical Exam Triage Vital Signs ED Triage Vitals  Encounter Vitals Group     BP 05/01/24 1439 108/73     Girls Systolic BP Percentile --      Girls Diastolic BP Percentile --      Boys Systolic BP Percentile --      Boys Diastolic BP Percentile --      Pulse Rate 05/01/24 1439 68     Resp 05/01/24 1439 17     Temp 05/01/24 1439 98.3 F (36.8 C)     Temp Source 05/01/24 1439 Oral     SpO2 05/01/24 1439 92 %     Weight --      Height --      Head Circumference --      Peak Flow --      Pain Score 05/01/24 1436 5     Pain Loc --      Pain Education --      Exclude from Growth Chart --    No data found.  Updated Vital Signs BP 108/73   Pulse 68   Temp 98.3 F (36.8 C) (Oral)   Resp 17   SpO2 92%   Visual Acuity Right Eye Distance:   Left Eye Distance:   Bilateral Distance:    Right Eye Near:   Left Eye Near:    Bilateral Near:     Physical Exam Vitals and nursing note reviewed.  Constitutional:      General: She is not in acute distress.    Appearance: Normal appearance. She is not ill-appearing.  HENT:     Head: Normocephalic and atraumatic.     Right Ear: Tympanic membrane and ear canal normal.     Left Ear: Tympanic membrane and ear canal normal.     Nose: Congestion present.     Mouth/Throat:     Mouth: Mucous membranes are moist.     Pharynx: No posterior oropharyngeal erythema.  Eyes:     Pupils: Pupils are equal, round, and reactive to light.  Cardiovascular:     Rate and Rhythm: Normal rate and regular rhythm.     Heart sounds: Normal heart sounds.  Pulmonary:     Effort: Pulmonary effort is  normal.     Breath sounds: Normal breath sounds. No wheezing or rhonchi.  Skin:    General: Skin is warm and dry.  Neurological:     General: No focal deficit present.     Mental Status: She is alert and oriented to person, place, and time.  Psychiatric:        Mood and Affect: Mood normal.        Behavior: Behavior normal.      UC Treatments / Results  Labs (all labs ordered are listed, but only abnormal results are displayed) Labs Reviewed - No data to display Comprehensive metabolic panel with GFR Order: [id number]  Status: Final result     Next appt: 05/30/2024 at 03:10 PM in Cardiology Berna [place], [place])     Dx: Fatigue, unspecified type   Test Result Released: Yes (seen)  Messages: Seen   2 Result Notes     1 Patient Communication     View Follow-Up Encounter     1 HM Topic          Component Ref Range & Units (hover) 5 mo ago (11/16/23) 1 yr ago (03/24/23) 1 yr ago (02/13/23) 1 yr ago (07/05/22) 1 yr ago (06/14/22) 1 yr ago (06/10/22) 2 yr ago (04/08/22)  Sodium 140 139 R 138 R 139 139 R 140 R 140 R  Potassium 3.6 3.5 R 3.5 R 3.6 3.6 R 3.4 Low  R 4.2 R  Chloride 103 100 R 102 R 102 102 R 104 R 105 R  CO2 27 24 R 29 R 29 27 R 26 R 29 R  Glucose, Bld 98 163 High  CM 69 Low  CM 118 High  110 High  CM 103 High  CM 125 High  CM  BUN 25 High  22 R 19 R 19 20 R 26 High  R 22 R  Creatinine, Ser 1.78 High  1.65 High  R 1.51 High  R 1.65 High  1.56 High  R 1.58 High  R 1.72 High  R  Total Bilirubin 0.5   0.5  0.5 R   Alkaline Phosphatase 62   55  42 R   AST 24   17  16  R   ALT 13   11  10  R   Total Protein 7.8   7.8  7.4 R   Albumin  4.0   4.1  3.8 R   GFR 26.77 Low    29.60 Low  CM     Comment: Calculated using the CKD-EPI Creatinine Equation (2021)  Calcium  9.8 9.7 R 9.7 R 9.6 9.6 R 9.5 R 9.1 R  Resulting Agency [place] [facility]        Specimen Collected: 11/16/23 11:09 Last Resulted:  11/16/23 15:01   EKG   Radiology DG Chest 2 View Result Date: 05/01/2024 CLINICAL DATA:  History of COVID pneumonia, COPD EXAM: CHEST - 2 VIEW COMPARISON:  Chest x-ray performed March 24, 2023 FINDINGS: No discrete lobar infiltrate. Linear scarring in the lower lung zones. No pleural effusion or pneumothorax. Heart mediastinum are not significantly changed. IMPRESSION: 1. No focal infiltrate, pleural effusion, or pneumothorax. Electronically Signed   By: Maude Naegeli M.D.   On: 05/01/2024 15:14    Procedures Procedures (including critical care time)  Medications Ordered in UC Medications - No data to display  Initial Impression / Assessment and Plan / UC Course  I have reviewed the triage vital signs and the nursing notes.  Pertinent labs & imaging results that were available during my care of the patient were reviewed by me and considered in my medical decision making (see chart for details).     Reviewed exam and symptoms with patient.  Chest x-ray negative for pneumonia.  Discussed continued COVID-19 symptoms with COPD exacerbation.  Labs reviewed.  Creatinine clearance 39 mL/min.  Will do Augmentin  twice daily for 7 days, no dosage adjustment indicated given creatinine clearance.  As Symbicort  was not covered by her insurance previously will try Advair.  She can continue the previously prescribed cough medicine.  Advised her to contact her PCP or pulmonologist and follow-up in 2 days for recheck.  Strict ER precautions reviewed and patient verbalized understanding Final Clinical Impressions(s) / UC Diagnoses   Final diagnoses:  Acute  cough  COPD exacerbation (HCC)  COVID-19     Discharge Instructions      Your chest x-ray was negative for pneumonia.  Please start Augmentin  twice daily for 7 days for your COPD symptoms.  I have sent in Advair steroid inhaler to use twice daily for your symptoms.  Continue your previously prescribed cough medicine as needed.  Lots of rest and  fluids.  Please follow-up with your pulmonologist or PCP in 2 days for recheck.  Please go to the ER if you develop any worsening symptoms.  I hope you feel better soon!     ED Prescriptions     Medication Sig Dispense Auth. Provider   amoxicillin -clavulanate (AUGMENTIN ) 875-125 MG tablet Take 1 tablet by mouth every 12 (twelve) hours. 14 tablet Jaiyla Granados, Jodi R, NP   fluticasone -salmeterol (ADVAIR HFA) 115-21 MCG/ACT inhaler Inhale 2 puffs into the lungs 2 (two) times daily. 1 each Loreda Myla SAUNDERS, NP      PDMP not reviewed this encounter.   Loreda Myla SAUNDERS, NP 05/01/24 [phone]

## 2024-05-01 NOTE — Discharge Instructions (Addendum)
 Your chest x-ray was negative for pneumonia.  Please start Augmentin  twice daily for 7 days for your COPD symptoms.  I have sent in Advair steroid inhaler to use twice daily for your symptoms.  Continue your previously prescribed cough medicine as needed.  Lots of rest and fluids.  Please follow-up with your pulmonologist or PCP in 2 days for recheck.  Please go to the ER if you develop any worsening symptoms.  I hope you feel better soon!

## 2024-05-01 NOTE — ED Triage Notes (Signed)
 Pt c/o chills, HA, loss of taste, nasal drainage, nasal congestion, fever highest 102x1wk Pt was dx w/COVID last week

## 2024-05-01 NOTE — Telephone Encounter (Signed)
 TC from pharmacy asking for an alternative to Advair. Pt is not wanting to fill due to cost.

## 2024-05-02 NOTE — Progress Notes (Unsigned)
 HPI F Smoker (1 pk/ week, 15 pkyrs followed for OSA, Bronchiectasis, complicated by  Orthostasis, HTN, CAD/ stent, Allergic Rhinitis, GERD,  NPSG 10/07/18- AHI 65.8/ hr, desaturation to 89%, body weight 209 lbs HST 02/09/21- AHI 50.5/ hr, desaturation to a nadir of 70%- average 87%, body weight 226 lbs PFT 05/04/21- Mild airtrapping but normal flows w/o resp to BD. DLCO is high for volume. HST 12/07/21- AHI 46.7/ hr, desaturation to 64%(mean 88%) body weight 226 lbs ===============================================================    04/05/24- 78 yoF former smoker (1 pk/ week, 10 pkyrs) followed for OSA, COPD/Bronchiectasis, complicated by  Orthostasis, HTN, CAD/ stent, Aortic Atherosclerosis, Allergic Rhinitis, GERD, Depression, Memory Disturbance, Hx R Nephrectomy/ Renal Cell Ca,  Son is [name] -Ventolin  hfa, Singulair ,Breztri , Flonase ,  CPAP auto 4-10/ [facility] Download-compliance - Body weight today 218 lbs Discussed the use of AI scribe software for clinical note transcription with the patient, who gave verbal consent to proceed.  History of Present Illness   [name] is an 78 year old female with sleep apnea who presents for follow-up regarding CPAP use and medication management.  She had stopped using her CPAP machine recently upon death of her son,  but has resumed its use, resulting in improved sleep quality. Without the CPAP, she wakes up early in the morning. She is no longer receiving Breztri  due to income-related issues and is not using any spray medication for her chest. She is currently breathing adequately. We will try generic Symbicort .     Assessment and Plan:    Chronic obstructive pulmonary disease (COPD) COPD managed with Breztri , now switching to Symbicort  due to insurance coverage issues. - Prescribe generic Symbicort , two puffs twice a day, with mouth rinsing after use.  Obstructive sleep apnea Obstructive sleep apnea managed with CPAP, improving sleep  quality. - Encourage continued use of CPAP machine for improved sleep quality.    10/225- 78 yoF former smoker (1 pk/ week, 10 pkyrs) followed for OSA, COPD/Bronchiectasis, complicated by  Orthostasis, HTN, CAD/ stent, Aortic Atherosclerosis, Allergic Rhinitis, GERD, Depression, Memory Disturbance, Hx R Nephrectomy/ Renal Cell Ca,  Son was [name] -Ventolin  hfa, Singulair ,Breztri , Flonase ,  CPAP auto 4-10/ [facility] Download-compliance - Body weight today - New onset Covid 9/22. Renal function prevented paxlovid  and molnupiravir not covered.  Rx'd 9/30 at [facility] w augmentin .  CXR 05/01/24 FINDINGS: No discrete lobar infiltrate. Linear scarring in the lower lung zones. No pleural effusion or pneumothorax. Heart mediastinum are not significantly changed. IMPRESSION: 1. No focal infiltrate, pleural effusion, or pneumothorax.     ROS-see HPI   + = positive Constitutional:    weight loss, night sweats, fevers, chills, fatigue, lassitude. HEENT:    headaches, +difficulty swallowing, tooth/dental problems, sore throat,       sneezing, itching, ear ache, +nasal congestion, post nasal drip, snoring CV:    chest pain, orthopnea, PND, swelling in lower extremities, anasarca,      dizziness, palpitations Resp:   +shortness of breath with exertion or at rest.                productive cough,   non-productive cough, coughing up of blood.              change in color of mucus.  wheezing.   Skin:    rash or lesions. GI:  No-   heartburn, indigestion, abdominal pain, nausea, vomiting, diarrhea,                 change in bowel  habits, loss of appetite GU: dysuria, change in color of urine, no urgency or frequency.   flank pain. MS:   +joint pain, stiffness, decreased range of motion, back pain. Neuro-     +dizzy Psych:  change in mood or affect.  depression or anxiety.   memory loss.  OBJ- Physical Exam General- Alert, Oriented, Affect-appropriate, Distress- none acute, + obese Skin- +spots of  vitelligo Lymphadenopathy- none Head- atraumatic            Eyes- Gross vision intact, PERRLA, conjunctivae and secretions clear            Ears- Hearing, canals-normal            Nose- Clear, no-Septal dev, mucus, polyps, erosion, perforation             Throat- Mallampati III, mucosa clear , drainage- none, +tonsils, + teeth Neck- flexible , trachea midline, no stridor , thyroid  nl, carotid no bruit Chest - symmetrical excursion , unlabored           Heart/CV- RRR , no murmur , no gallop  , no rub, nl s1 s2                           - JVD- none , edema- none, stasis changes- none, varices- none           Lung- +clear, wheeze- none, cough- none , dullness-none, rub- none           Chest wall-  Abd-  Br/ Gen/ Rectal- Not done, not indicated Extrem- cyanosis- none, clubbing, none, atrophy- none, strength- nl Neuro- grossly intact to observation

## 2024-05-03 ENCOUNTER — Telehealth: Payer: Self-pay

## 2024-05-03 ENCOUNTER — Ambulatory Visit: Admitting: Internal Medicine

## 2024-05-03 ENCOUNTER — Encounter: Payer: Self-pay | Admitting: Internal Medicine

## 2024-05-03 VITALS — BP 124/76 | HR 90 | Temp 98.3°F | Ht 66.0 in | Wt 214.2 lb

## 2024-05-03 DIAGNOSIS — J449 Chronic obstructive pulmonary disease, unspecified: Secondary | ICD-10-CM | POA: Diagnosis not present

## 2024-05-03 DIAGNOSIS — G4733 Obstructive sleep apnea (adult) (pediatric): Secondary | ICD-10-CM

## 2024-05-03 MED ORDER — ALBUTEROL SULFATE HFA 108 (90 BASE) MCG/ACT IN AERS: 2.0000 | INHALATION_SPRAY | Freq: Four times a day (QID) | RESPIRATORY_TRACT | 12 refills | Status: AC | PRN

## 2024-05-03 NOTE — Patient Instructions (Signed)
 Order- DME ([facility] provides her CPAP) new compressor nebulizer,      And DuoNeb  # 360 ml,  1 neb every 6 hours if needed for breathing, refill x 12        Dx COPD Mixed Type  Script sent for albuterol  hfa rescue inhaler use every 6 hours if needed for wheeze/ shortness of breath

## 2024-05-03 NOTE — Telephone Encounter (Signed)
 Copied from CRM [id number]. Topic: Clinical - Prescription Issue >> May 01, 2024  4:45 PM Devaughn RAMAN wrote: Reason for CRM: Patient stated she was prescribed an inhaler but she cannot afford it. Patient stated she needs a cheaper inhaler and she has Covid-19 and she went to Urgent Care and she stated she is still coughing and congested and advised she follow up with her pulmonologist. Patient stated she was diagnosed with chronic obstructive pulmonary disease and covid-19 from the Urgent Care.   Patient was seen in office on 05/03/24  -NFN

## 2024-05-04 ENCOUNTER — Encounter: Payer: Self-pay | Admitting: Internal Medicine

## 2024-05-04 NOTE — Assessment & Plan Note (Signed)
 Acute Covid infection, not treated with antiviral. Plan: rescue inhaler refilled. Nebulizer compressor with DuoNeb as needed every 6 hours.

## 2024-05-04 NOTE — Assessment & Plan Note (Signed)
 She will restart routine use of her CPAP as she recovers from Covid infection. Plan- continue auto 4-10

## 2024-05-09 DIAGNOSIS — C641 Malignant neoplasm of right kidney, except renal pelvis: Secondary | ICD-10-CM | POA: Diagnosis not present

## 2024-05-09 DIAGNOSIS — D631 Anemia in chronic kidney disease: Secondary | ICD-10-CM | POA: Diagnosis not present

## 2024-05-09 DIAGNOSIS — N184 Chronic kidney disease, stage 4 (severe): Secondary | ICD-10-CM | POA: Diagnosis not present

## 2024-05-09 DIAGNOSIS — Z905 Acquired absence of kidney: Secondary | ICD-10-CM | POA: Diagnosis not present

## 2024-05-09 DIAGNOSIS — N2581 Secondary hyperparathyroidism of renal origin: Secondary | ICD-10-CM | POA: Diagnosis not present

## 2024-05-09 DIAGNOSIS — N189 Chronic kidney disease, unspecified: Secondary | ICD-10-CM | POA: Diagnosis not present

## 2024-05-09 DIAGNOSIS — I129 Hypertensive chronic kidney disease with stage 1 through stage 4 chronic kidney disease, or unspecified chronic kidney disease: Secondary | ICD-10-CM | POA: Diagnosis not present

## 2024-05-09 DIAGNOSIS — E1122 Type 2 diabetes mellitus with diabetic chronic kidney disease: Secondary | ICD-10-CM | POA: Diagnosis not present

## 2024-05-10 ENCOUNTER — Telehealth: Payer: Self-pay | Admitting: Family Medicine

## 2024-05-10 NOTE — Telephone Encounter (Signed)
 Copied from CRM [id number]. Topic: Clinical - Medication Refill >> May 10, 2024 12:39 PM Nathanel DEL wrote: Medication: albuterol  nebulizer solution  Has the patient contacted their pharmacy? No (Pt states Dr Neysa ordered her a nebulizer and it came yesterday.  But she has no solution to put in it  This is the patient's preferred pharmacy:  [facility] [address] [place] - [address] ANNA MULLIGAN [place] [address] Phone: [phone] Fax: [phone]  Is this the correct pharmacy for this prescription? Yes If no, delete pharmacy and type the correct one.   Has the prescription been filled recently? No  N/A  Is the patient out of the medication? Yes  Has the patient been seen for an appointment in the last year OR does the patient have an upcoming appointment? No  Can we respond through MyChart? No  Agent: Please be advised that Rx refills may take up to 3 business days. We ask that you follow-up with your pharmacy.

## 2024-05-14 ENCOUNTER — Ambulatory Visit (INDEPENDENT_AMBULATORY_CARE_PROVIDER_SITE_OTHER): Admitting: Psychology

## 2024-05-14 ENCOUNTER — Other Ambulatory Visit: Payer: Self-pay

## 2024-05-14 DIAGNOSIS — F330 Major depressive disorder, recurrent, mild: Secondary | ICD-10-CM

## 2024-05-14 MED ORDER — IPRATROPIUM-ALBUTEROL 0.5-2.5 (3) MG/3ML IN SOLN: RESPIRATORY_TRACT | 12 refills | Status: AC

## 2024-05-14 NOTE — Progress Notes (Unsigned)
 [facility] Counselor/Therapist Progress Note  Patient ID: [name], MRN: [id number],    Date: 05/14/2024  Time Spent: 2:30pm-3:31pm  pt is seen for an in person visit.     Treatment Type: Individual Therapy  Reported Symptoms: pt reports some grief. Pt reports some positive interactions and engagement that feels good about.  Mental Status Exam: Appearance:  Well Groomed     Behavior: Appropriate  Motor: Normal  Speech/Language:  Normal Rate  Affect: Appropriate  Mood: depressed  Thought process: normal  Thought content:   WNL  Sensory/Perceptual disturbances:   WNL  Orientation: oriented to person, place, time/date, and situation  Attention: Good  Concentration: Good  Memory: WNL  Fund of knowledge:  Good  Insight:   Good  Judgment:  Good  Impulse Control: Good   Risk Assessment: Danger to Self:  No Self-injurious Behavior: No Danger to Others: No Duty to Warn:no Physical Aggression / Violence:No  Access to Firearms a concern: No  Gang Involvement:No   Subjective: Counselor assessed pt current functioning per pt report. Processed w/pt moods and grief.  Validated and normalized pt grief feelings missing son, and son's cat.  Assisted pt w/ recognizing ways she is honoring him.  Discussed upcoming holidays and planning for.  Reflected positive of engagement and positive interactions w/ friends.  Pt affect wnl.  Pt reports she is missing her son and feelings of grief expressed.  Pt also reports that had to put down his cat last week.  Pt reflected on positive memories and that honoring him w/ attending memorial services upcoming.  Pt aware that holidays as will be difficult and that her and daughter are planning to go to beach and recognize this place he enjoyed.  Pt reports she is talking w/ her friends more and this is positive.  Pt also reports able ot have small talk w others and enjoys.     Interventions: supportive  Diagnosis:Mild episode of  recurrent major depressive disorder  Plan: pt to f/u in 3-4 weeks for counseling.  Pt to f/u as scheduled w/ PCP and psychiatrist as scheduled.      Individualized Treatment Plan Strengths: enjoys time w/ daughter, watching her t.v shows.  Baking/cooking at times.    Supports: her daughter, her cousin   Goal/Needs for Treatment:  In order of importance to patient 1) continue engagement in activities/ talk w/ friends 2) decreased depressed moods 3) work through Therapist, art of Needs: continue to have support to cope w/ depression and grieve     Treatment Level:out pt counseling  Symptoms:depressed mood, tearfulness, loss of interest, difficulty w/ motivation, withdrawn from interacting.   Client Treatment Preferences:biweekly to monthly in person counseling.  Continue medication management w/ psychiatrist.    Healthcare consumer's goal for treatment:  Counselor, [name], [facility] will support the patient's ability to achieve the goals identified. Cognitive Behavioral Therapy, Assertive Communication/Conflict Resolution Training, Relaxation Training, ACT, Humanistic and other evidenced-based practices will be used to promote progress towards healthy functioning.   Healthcare consumer will: Actively participate in therapy, working towards healthy functioning.    *Justification for Continuation/Discontinuation of Goal: R=Revised, O=Ongoing, A=Achieved, D=Discontinued  Goal 1)  Increased engagement w/ activities she enjoys and w/ social interactions AEB pt report and counselor observation. Baseline date 12/29/23: Progress towards goal 50; How Often - Daily Target Date Goal Was reviewed Status Code Progress towards goal/Likert rating  12/28/24  Goal 2) Increased coping w/ losses and w/ depressed moods AEB utilizing coping skills daily.   Baseline date 12/29/23: Progress towards goal 0; How Often - Daily Target Date Goal Was reviewed Status Code Progress  towards goal  12/28/24                  This plan has been reviewed and created by the following participants:  This plan will be reviewed at least every 12 months. Date Behavioral Health Clinician Date Guardian/Patient   12/29/23  [facility] Barbarann [facility] 12/29/23 Verbal Consent Provided and electronic signature requested                          [name] LCMHC

## 2024-05-14 NOTE — Telephone Encounter (Signed)
 I called [facility] and verified that they did receive the order for the nebulizer machine and solution.  They were told she would pick it up on 10/8 however she did not pick it up.  Advised I would call the patient.  I called and spoke with patient's daughter Chick ([place]), she states her mom did pick up the nebulizer machine, but was not given the duoneb solution.  I advised I would send it to [facility] pharmacy.  She verbalized understanding.  Nothing further needed.  Nebulizer solution was sent in already by [facility].

## 2024-05-18 ENCOUNTER — Encounter: Payer: Self-pay | Admitting: Family Medicine

## 2024-05-18 ENCOUNTER — Ambulatory Visit: Admitting: Family Medicine

## 2024-05-18 VITALS — BP 100/68 | HR 64 | Temp 98.2°F | Resp 16 | Ht 66.0 in | Wt 217.2 lb

## 2024-05-18 DIAGNOSIS — R5383 Other fatigue: Secondary | ICD-10-CM | POA: Diagnosis not present

## 2024-05-18 DIAGNOSIS — R630 Anorexia: Secondary | ICD-10-CM | POA: Diagnosis not present

## 2024-05-18 DIAGNOSIS — I119 Hypertensive heart disease without heart failure: Secondary | ICD-10-CM | POA: Diagnosis not present

## 2024-05-18 NOTE — Assessment & Plan Note (Signed)
 Today's BP on lower normal range. Recommend monitoring BP at home. She has an appointment with her cardiologist in a few days. Continue losartan  100 mg daily and metoprolol  succinate 100 mg daily as well as low-salt diet.

## 2024-05-18 NOTE — Patient Instructions (Addendum)
 A few things to remember from today's visit:  Decreased appetite  Other fatigue Most likely die to recent covid infection. Be sure to eat protein and adequate hydration. Monitor for fever or worsening symptoms. Continue Flonase  ands Astelin  nasal spray ans saline irrigations as needed.  If you need refills for medications you take chronically, please call your pharmacy. Do not use My Chart to request refills or for acute issues that need immediate attention. If you send a my chart message, it may take a few days to be addressed, specially if I am not in the office.  Please be sure medication list is accurate. If a new problem present, please set up appointment sooner than planned today.

## 2024-05-18 NOTE — Progress Notes (Signed)
 ACUTE VISIT Chief Complaint  Patient presents with   Postcovid    Pt reports she had covid about 3 wks ago . Still has little appetite, low energy, still has some congestion going.  Use inhaler for COPD. Did f/u pulmonology. Denied fever.     Discussed the use of AI scribe software for clinical note transcription with the patient, who gave verbal consent to proceed.  History of Present Illness [name] is an 78 year old female with a PMHx significant for CAD, HTN, PAD, COPD, OSA, GERD, DM II, HLD, polyneuropathy, CKD III, B12 deficiency, and vitamin D  deficiency; who presents with persistent low energy, decreased appetite, and congestion following a COVID-19 infection. Evaluated in acute care on 04/23/2024, when she was diagnosed with COVID-19 infection. Due to CKD IV, she was not treated with Paxlovid . Molnupiravid was recommended but not covered by her health insurance.  UC visit on 05/01/2024, diagnosed with COPD exacerbation. CXR no focal infiltrate, pleural effusion, or pneumothorax. Evaluated by pulmonologist on 05/03/2024.  She is concerned because persistent low energy, decreased appetite, and congestion.  She denies fever but has had chills.  Her pulmonologist recommended Duoneb, which she is taking 2 times per day. She is also on Advair 115-21 mcg 2 puff bid and Albuterol  inh. Cough has improved and not longer needs Benzonatate .  Allergic rhinitis on Astelin  and Flonase  nasal spray.  Since her last visit, 03/23/24, she has established with nephrologist, who prescribed a medication, not sure about name Siri?) that her insurance has not yet approved.   Her BP today on lower normal range. She is on Losartan  100 mg daily, hydrochlorothiazide  25 mg daily, and Metoprolol  succinate 100 mg daily.  She had blood work done at her nephrologist's office 2 weeks ago.  Review of Systems  Constitutional:  Positive for activity change, appetite change and fatigue.  HENT:  Positive  for postnasal drip and rhinorrhea. Negative for mouth sores and sore throat.   Respiratory:  Negative for shortness of breath and wheezing.   Cardiovascular:  Negative for chest pain, palpitations and leg swelling.  Gastrointestinal:  Negative for abdominal pain, nausea and vomiting.  Genitourinary:  Negative for decreased urine volume, dysuria and hematuria.  Skin:  Negative for rash.  Neurological:  Negative for syncope, weakness and headaches.  See other pertinent positives and negatives in HPI.  Current Outpatient Medications on File Prior to Visit  Medication Sig Dispense Refill   acetaminophen  (TYLENOL ) 500 MG tablet Take 1,000 mg by mouth as needed for moderate pain or headache.      albuterol  (VENTOLIN  HFA) 108 (90 Base) MCG/ACT inhaler Inhale 2 puffs into the lungs every 6 (six) hours as needed for wheezing or shortness of breath. 8 g 12   Aspirin  81 MG CAPS Take 81 mg by mouth daily.     azelastine  (ASTELIN ) 0.1 % nasal spray Place 2 sprays into both nostrils 2 (two) times daily. Use in each nostril as directed 30 mL 5   benzonatate  (TESSALON ) 100 MG capsule Take 1 capsule (100 mg total) by mouth 3 (three) times daily as needed for cough. 30 capsule 0   famotidine  (PEPCID ) 20 MG tablet TAKE ONE (1) TABLET BY MOUTH TWICE DAILY IN THE MORNING AND AT BEDTIME 180 tablet 1   fluticasone  (FLONASE ) 50 MCG/ACT nasal spray PLACE 2 SPRAYS INTO BOTH NOSTRILS AT BEDTIME AS NEEDED FOR ALLERGIES. 48 mL 1   fluticasone -salmeterol (ADVAIR HFA) 115-21 MCG/ACT inhaler Inhale 2 puffs into the lungs  2 (two) times daily. 1 each 0   hydrochlorothiazide  (HYDRODIURIL ) 25 MG tablet TAKE 1 TABLET BY MOUTH ONCE DAILY 90 tablet 2   ipratropium (ATROVENT ) 0.06 % nasal spray Place 2 sprays into both nostrils 4 (four) times daily. 15 mL 1   ipratropium-albuterol  (DUONEB) 0.5-2.5 (3) MG/3ML SOLN 1 neb every 6 hours if needed for breathing 360 mL 12   losartan  (COZAAR ) 100 MG tablet TAKE 1 TABLET BY MOUTH EVERY  MORNING 90 tablet 2   meclizine  (ANTIVERT ) 25 MG tablet Take 1 tablet (25 mg total) by mouth 3 (three) times daily as needed for dizziness. 30 tablet 0   metoprolol  succinate (TOPROL -XL) 100 MG 24 hr tablet TAKE 1 TABLET BY MOUTH DAILY AT NOON 90 tablet 2   nitroGLYCERIN  (NITROSTAT ) 0.4 MG SL tablet Place 1 tablet (0.4 mg total) under the tongue every 5 (five) minutes as needed for chest pain. 25 tablet 2   pantoprazole  (PROTONIX ) 40 MG tablet TAKE 1 TABLET BY MOUTH ONCE DAILY 30 tablet 9   promethazine -dextromethorphan (PROMETHAZINE -DM) 6.25-15 MG/5ML syrup Take 5 mLs by mouth at bedtime as needed for cough. 100 mL 0   rosuvastatin  (CRESTOR ) 10 MG tablet TAKE 1 TABLET BY MOUTH EVERY DAY AT BEDTIME 90 tablet 1   sertraline  (ZOLOFT ) 100 MG tablet Take 100 mg by mouth daily.     No current facility-administered medications on file prior to visit.    Past Medical History:  Diagnosis Date   Allergy     Angio-edema    Anxiety    C. difficile diarrhea    history of   CAD (coronary artery disease), native coronary artery    a. 05/2017 showed 100% mLCx tx with DES, otherwise 20% mRCA, 50% ramus, 30% dLAD, 20% prox-mid LAD, 20% mLAD treated medically.    Cancer [facility])    Right kidney CA removed.    CKD (chronic kidney disease) stage 3, GFR 30-59 ml/min (HCC) 10/11/2016   s/p R nephrectomy   Colon polyps 2008   HYPERPLASTIC   COPD (chronic obstructive pulmonary disease) (HCC)    former smoker   Depression    Gastritis    GERD (gastroesophageal reflux disease)    History of echocardiogram    Echo 3/18:  Moderate LVH, EF 60-65, grade 1 diastolic dysfunction, calcified aortic valve, mild MR, moderate LAE   History of nuclear stress test    Myoview  3/18: Mod size and intensity fixed septal defect, may be artifact.Opposite mod size and intensity lat defect, which is reversible and could represent ischemia or possibly artifact (SDS 4). LVEF 71% with normal wall motion. Intermediate risk study. >>  images reviewed with Dr. Vina Gull - no sig ischemia; med rx    Hx of cardiovascular stress test    Lexiscan  Myoview  6/16:  EF 70%, no scar or ischemia; Low Risk   Hyperlipidemia    Hypertension    Myocardial infarction (HCC)    2018   Neuropathy    Orthostatic hypotension 05/28/2017   OSA (obstructive sleep apnea) 01/16/2021   Osteoarthritis    Pre-diabetes    no meds   Recurrent upper respiratory infection (URI)    Rheumatoid arthritis (HCC)    RA (Dr. Lenon) Bilateral hands   S/P angioplasty with stent 05/27/17 to LCX with DES  05/28/2017   Sleep apnea 2023   does not use CPAP   Thyroid  disease    Allergies  Allergen Reactions   Oxycodone -Acetaminophen  Hives and Itching    hallucinations   Percocet [  Oxycodone -Acetaminophen ] Hives, Itching and Other (See Comments)    hallucinations   Pravastatin  Sodium Other (See Comments)    Muscle aches Muscle aches   Hydrocodone  Other (See Comments)    crazy dreams   Aspirin  Other (See Comments)    GI upset Other reaction(s): Other (See Comments) REACTION: GI upset   Lexapro [Escitalopram] Itching    Pt reports caused itching and breaking out.       Social History   Socioeconomic History   Marital status: Divorced    Spouse name: Not on file   Number of children: 3   Years of education: 12   Highest education level: Not on file  Occupational History   Occupation: Retired    Associate Professor: RETIRED  Tobacco Use   Smoking status: Former    Current packs/day: 0.00    Average packs/day: 0.2 packs/day for 53.1 years (10.6 ttl pk-yrs)    Types: Cigarettes    Start date: 68    Quit date: 09/07/2021    Years since quitting: 2.6   Smokeless tobacco: Never   Tobacco comments:    pt has been an on and off smoker since age 56  Vaping Use   Vaping status: Never Used  Substance and Sexual Activity   Alcohol use: No    Alcohol/week: 0.0 standard drinks of alcohol   Drug use: No   Sexual activity: Not Currently    Birth  control/protection: Surgical    Comment: hyst  Other Topics Concern   Not on file  Social History Narrative   2025- dgtr lives with her   Alcohol use- no   Drug use-no   R handed    Social Drivers of Health   Financial Resource Strain: Low Risk  (09/08/2022)   Overall Financial Resource Strain (CARDIA)    Difficulty of Paying Living Expenses: Not hard at all  Food Insecurity: No Food Insecurity (10/20/2022)   Hunger Vital Sign    Worried About Running Out of Food in the Last Year: Never true    Ran Out of Food in the Last Year: Never true  Transportation Needs: No Transportation Needs (09/08/2022)   PRAPARE - Administrator, Civil Service (Medical): No    Lack of Transportation (Non-Medical): No  Physical Activity: Inactive (09/08/2022)   Exercise Vital Sign    Days of Exercise per Week: 0 days    Minutes of Exercise per Session: 0 min  Stress: Stress Concern Present (09/08/2022)   [employer] of Occupational Health - Occupational Stress Questionnaire    Feeling of Stress : To some extent  Social Connections: Moderately Integrated (09/08/2022)   Social Connection and Isolation Panel    Frequency of Communication with Friends and Family: More than three times a week    Frequency of Social Gatherings with Friends and Family: More than three times a week    Attends Religious Services: More than 4 times per year    Active Member of [employer] or Organizations: Yes    Attends Banker Meetings: More than 4 times per year    Marital Status: Divorced    Vitals:   05/18/24 1540  BP: 100/68  Pulse: 64  Resp: 16  Temp: 98.2 F (36.8 C)  SpO2: 97%   Wt Readings from Last 3 Encounters:  05/18/24 217 lb 3.2 oz (98.5 kg)  05/03/24 214 lb 3.2 oz (97.2 kg)  04/05/24 218 lb (98.9 kg)   Body mass index is 35.06 kg/m.  Physical Exam  Vitals and nursing note reviewed.  Constitutional:      General: She is not in acute distress.    Appearance: She is  well-developed.  HENT:     Head: Normocephalic and atraumatic.     Nose: No congestion.     Right Turbinates: Enlarged.     Left Turbinates: Enlarged.     Mouth/Throat:     Mouth: Mucous membranes are moist.     Pharynx: Postnasal drip present.  Eyes:     Conjunctiva/sclera: Conjunctivae normal.  Cardiovascular:     Rate and Rhythm: Normal rate and regular rhythm.     Heart sounds: Murmur (Soft SEM RUSB) heard.  Pulmonary:     Effort: Pulmonary effort is normal. No respiratory distress.     Breath sounds: Normal breath sounds.  Abdominal:     Palpations: Abdomen is soft. There is no mass.     Tenderness: There is no abdominal tenderness.  Lymphadenopathy:     Cervical: No cervical adenopathy.  Skin:    General: Skin is warm.     Findings: No erythema or rash.  Neurological:     General: No focal deficit present.     Mental Status: She is alert and oriented to person, place, and time.     Gait: Gait normal.  Psychiatric:        Mood and Affect: Mood and affect normal.   ASSESSMENT AND PLAN:  Ms. Shedden was seen today for decreased appetite and fatigue after COVID-19 infection.  Decreased appetite Most symptoms from COVID-19 infection have resolved. Lost about 2 Lb since her last visit , 03/23/24. Stressed the importance of adequate protein intake.  Other fatigue We discussed possible etiologies.Examination today and hx are not suggestive of a serious process. Explained that it is not uncommon on after recovering from COVID-19 infection to have some fatigue, congestion, decreased appetite. Monitor for new symptoms. Some of her chronic comorbilities like asthma and allergies can also be contributing factors. I do not think further workup is needed at this time.  She just had blood work done at her nephrologist's office 2 weeks ago. Instructed about warning signs.  Hypertension with heart disease Assessment & Plan: Today's BP on lower normal range. Recommend monitoring BP  at home. She has an appointment with her cardiologist in a few days. Continue losartan  100 mg daily and metoprolol  succinate 100 mg daily as well as low-salt diet.  I personally spent a total of 34 minutes in the care of the patient today including preparing to see the patient, getting/reviewing separately obtained history, performing a medically appropriate exam/evaluation, counseling and educating, and documenting clinical information in the EHR.  Return if symptoms worsen or fail to improve, for keep next appointment.  Ryane Konieczny G. [place], MD  [facility]. [place] office.

## 2024-05-28 NOTE — Progress Notes (Unsigned)
 Cardiology Office Note:    Date:  05/30/2024   ID:  BAYLYN SICKLES, DOB [birth year], MRN [id number]  PCP:  Jordan, [name], MD   [facility] Providers Cardiologist:  Vina Gull, MD     Referring MD: Jordan, [name], MD   Chief Complaint  Patient presents with   Follow-up    CAD, HTN, HLD   History of Present Illness:    [name] is a 78 y.o. female with a hx of coronary artery disease s/p DES to LCx in 2018 with mild to moderate nonobstructive CAD in other vessels recent low risk Myoview , hypertension, hyperlipidemia with myalgias, diabetes, CKD stage 4, prior renal cancer s/p right nephrectomy, chronic edema, chronic OSA, neuropathy, RA, recent COVID infection,  who presents to clinic today for follow up.   She was last seen by Dr. Gull 06/2023 and was doing well. At this visit, she had denied any chest pain, shortness of breath, dizziness. She reported having some chronic back pain which was limiting her activity. Her medication regimen at this time included: ASA 81 mg daily, Repatha , hydrochlorothiazide  25 mg daily, Losartan  100 mg daily, Toprol  100 mg daily, Crestor  10 mg daily.   Recent cardiac workup: Myoview , 03/2023: small inferolateral infarct without evidence of ischemia. Apical thinning with normal apical function. Similar to prior study.  Apical thinning not seen in prior; LCX perfusion has improved from 09/01/2020 study. Echocardiogram, 08/2020: LVEF 60-65%, G1DD, normal RV function, normal valves, normal IVC LHC, 05/2017: mild nonobstructive CAD in LAD, RCA. CTO of mid LCx with PCTA/DES to mid Lcx  Today in clinic, she tells me that overall she is doing well. She was recently diagnosed with COVID-19 earlier this month but she is feeling much better in regards to this. She reports that her BP numbers have been looking good at home. She tells me that she stopped taking Repatha  about 4 months ago because she ran out of her script and never got it refilled. She tells me  that her son passed March 2025 after being on dialysis for 10 years and then got an infection in the process of them switching dialysis access sites. She is seeing a psychiatrist and is doing well with her support, daughter lives at home with her.    Past Medical History:  Diagnosis Date   Allergy     Angio-edema    Anxiety    C. difficile diarrhea    history of   CAD (coronary artery disease), native coronary artery    a. 05/2017 showed 100% mLCx tx with DES, otherwise 20% mRCA, 50% ramus, 30% dLAD, 20% prox-mid LAD, 20% mLAD treated medically.    Cancer [facility])    Right kidney CA removed.    CKD (chronic kidney disease) stage 3, GFR 30-59 ml/min (HCC) 10/11/2016   s/p R nephrectomy   Colon polyps 2008   HYPERPLASTIC   COPD (chronic obstructive pulmonary disease) (HCC)    former smoker   Depression    Gastritis    GERD (gastroesophageal reflux disease)    History of echocardiogram    Echo 3/18:  Moderate LVH, EF 60-65, grade 1 diastolic dysfunction, calcified aortic valve, mild MR, moderate LAE   History of nuclear stress test    Myoview  3/18: Mod size and intensity fixed septal defect, may be artifact.Opposite mod size and intensity lat defect, which is reversible and could represent ischemia or possibly artifact (SDS 4). LVEF 71% with normal wall motion. Intermediate risk study. >> images  reviewed with Dr. Vina Gull - no sig ischemia; med rx    Hx of cardiovascular stress test    Lexiscan  Myoview  6/16:  EF 70%, no scar or ischemia; Low Risk   Hyperlipidemia    Hypertension    Myocardial infarction (HCC)    2018   Neuropathy    Orthostatic hypotension 05/28/2017   OSA (obstructive sleep apnea) 01/16/2021   Osteoarthritis    Pre-diabetes    no meds   Recurrent upper respiratory infection (URI)    Rheumatoid arthritis (HCC)    RA (Dr. Lenon) Bilateral hands   S/P angioplasty with stent 05/27/17 to LCX with DES  05/28/2017   Sleep apnea 2023   does not use CPAP   Thyroid   disease    Past Surgical History:  Procedure Laterality Date   ABDOMINAL HYSTERECTOMY  1978   ANTERIOR CERVICAL DECOMP/DISCECTOMY FUSION N/A 05/29/2018   Procedure: ANTERIOR CERVICAL DECOMPRESSION FUSION - CERVICAL FIVE-CERVICAL SIX - CERVICAL SIX-CERVICAL SEVEN;  Surgeon: Louis Shove, MD;  Location: [facility] OR;  Service: Neurosurgery;  Laterality: N/A;   BACK SURGERY     x 2   BREAST LUMPECTOMY WITH RADIOACTIVE SEED LOCALIZATION Right 04/13/2022   Procedure: RIGHT BREAST SEED LUMPECTOMY;  Surgeon: Vanderbilt Ned, MD;  Location: [facility];  Service: General;  Laterality: Right;   CARPAL TUNNEL RELEASE Left    COLONOSCOPY     COLONOSCOPY W/ BIOPSIES AND POLYPECTOMY     Hx: of   CORONARY STENT INTERVENTION N/A 05/27/2017   Procedure: CORONARY STENT INTERVENTION;  Surgeon: Verlin Lonni BIRCH, MD;  Location: [facility] INV[facility];  Service: Cardiovascular;  Laterality: N/A;   ESOPHAGOGASTRODUODENOSCOPY     HNP     LEFT HEART CATH AND CORONARY ANGIOGRAPHY N/A 05/27/2017   Procedure: LEFT HEART CATH AND CORONARY ANGIOGRAPHY;  Surgeon: Verlin Lonni BIRCH, MD;  Location: [facility];  Service: Cardiovascular;  Laterality: N/A;   LUMBAR LAMINECTOMY/DECOMPRESSION MICRODISCECTOMY Left 02/12/2013   Procedure: LUMBAR TWO THREE, LUMBAR THREE FOUR, LUMBAR FOUR FIVE  LAMINECTOMY/DECOMPRESSION MICRODISCECTOMY 3 LEVELS;  Surgeon: Shove DELENA Louis, MD;  Location: [facility] NEURO ORS;  Service: Neurosurgery;  Laterality: Left;   NEPHRECTOMY Right 2010   10.rcc cancer   TOTAL KNEE ARTHROPLASTY Right    Redo   UPPER GASTROINTESTINAL ENDOSCOPY     Current Medications: Current Meds  Medication Sig   acetaminophen  (TYLENOL ) 500 MG tablet Take 1,000 mg by mouth as needed for moderate pain or headache.    albuterol  (VENTOLIN  HFA) 108 (90 Base) MCG/ACT inhaler Inhale 2 puffs into the lungs every 6 (six) hours as needed for wheezing or shortness of breath.   Aspirin  81 MG CAPS Take 81 mg by mouth  daily.   azelastine  (ASTELIN ) 0.1 % nasal spray Place 2 sprays into both nostrils 2 (two) times daily. Use in each nostril as directed   famotidine  (PEPCID ) 20 MG tablet TAKE ONE (1) TABLET BY MOUTH TWICE DAILY IN THE MORNING AND AT BEDTIME   fluticasone  (FLONASE ) 50 MCG/ACT nasal spray PLACE 2 SPRAYS INTO BOTH NOSTRILS AT BEDTIME AS NEEDED FOR ALLERGIES.   fluticasone -salmeterol (ADVAIR HFA) 115-21 MCG/ACT inhaler Inhale 2 puffs into the lungs 2 (two) times daily.   hydrochlorothiazide  (HYDRODIURIL ) 25 MG tablet TAKE 1 TABLET BY MOUTH ONCE DAILY   ipratropium (ATROVENT ) 0.06 % nasal spray Place 2 sprays into both nostrils 4 (four) times daily.   ipratropium-albuterol  (DUONEB) 0.5-2.5 (3) MG/3ML SOLN 1 neb every 6 hours if needed for breathing  losartan  (COZAAR ) 100 MG tablet TAKE 1 TABLET BY MOUTH EVERY MORNING   meclizine  (ANTIVERT ) 25 MG tablet Take 1 tablet (25 mg total) by mouth 3 (three) times daily as needed for dizziness.   metoprolol  succinate (TOPROL -XL) 100 MG 24 hr tablet TAKE 1 TABLET BY MOUTH DAILY AT NOON   nitroGLYCERIN  (NITROSTAT ) 0.4 MG SL tablet Place 1 tablet (0.4 mg total) under the tongue every 5 (five) minutes as needed for chest pain.   pantoprazole  (PROTONIX ) 40 MG tablet TAKE 1 TABLET BY MOUTH ONCE DAILY   rosuvastatin  (CRESTOR ) 10 MG tablet TAKE 1 TABLET BY MOUTH EVERY NIGHT AT BEDTIME   sertraline  (ZOLOFT ) 100 MG tablet Take 100 mg by mouth daily.    Allergies:   Oxycodone -acetaminophen , Percocet [oxycodone -acetaminophen ], Pravastatin  sodium, Hydrocodone , Aspirin , and Lexapro [escitalopram]   Social History   Socioeconomic History   Marital status: Divorced    Spouse name: Not on file   Number of children: 3   Years of education: 12   Highest education level: Not on file  Occupational History   Occupation: Retired    Associate Professor: RETIRED  Tobacco Use   Smoking status: Former    Current packs/day: 0.00    Average packs/day: 0.2 packs/day for 53.1 years (10.6  ttl pk-yrs)    Types: Cigarettes    Start date: 27    Quit date: 09/07/2021    Years since quitting: 2.7   Smokeless tobacco: Never   Tobacco comments:    pt has been an on and off smoker since age 81  Vaping Use   Vaping status: Never Used  Substance and Sexual Activity   Alcohol use: No    Alcohol/week: 0.0 standard drinks of alcohol   Drug use: No   Sexual activity: Not Currently    Birth control/protection: Surgical    Comment: hyst  Other Topics Concern   Not on file  Social History Narrative   2025- dgtr lives with her   Alcohol use- no   Drug use-no   R handed    Social Drivers of Health   Financial Resource Strain: Low Risk  (09/08/2022)   Overall Financial Resource Strain (CARDIA)    Difficulty of Paying Living Expenses: Not hard at all  Food Insecurity: No Food Insecurity (10/20/2022)   Hunger Vital Sign    Worried About Running Out of Food in the Last Year: Never true    Ran Out of Food in the Last Year: Never true  Transportation Needs: No Transportation Needs (09/08/2022)   PRAPARE - Administrator, Civil Service (Medical): No    Lack of Transportation (Non-Medical): No  Physical Activity: Inactive (09/08/2022)   Exercise Vital Sign    Days of Exercise per Week: 0 days    Minutes of Exercise per Session: 0 min  Stress: Stress Concern Present (09/08/2022)   [employer] of Occupational Health - Occupational Stress Questionnaire    Feeling of Stress : To some extent  Social Connections: Moderately Integrated (09/08/2022)   Social Connection and Isolation Panel    Frequency of Communication with Friends and Family: More than three times a week    Frequency of Social Gatherings with Friends and Family: More than three times a week    Attends Religious Services: More than 4 times per year    Active Member of [employer] or Organizations: Yes    Attends Engineer, Structural: More than 4 times per year    Marital Status: Divorced  Family  History: The patient's family history includes Bipolar disorder in her daughter; Cancer in her brother; Dementia in her brother and brother; Diabetes in her brother and brother; Heart disease in her father; Kidney disease in her son; Parkinsonism in her brother; Prostate cancer in her father; Rheum arthritis in her mother; Stroke in her mother. There is no history of Colon cancer, Esophageal cancer, Pancreatic cancer, Liver disease, Rectal cancer, or Stomach cancer.  ROS:   Please see the history of present illness.    All other systems reviewed and are negative.  EKGs/Labs/Other Studies Reviewed:    The following studies were reviewed today:      Recent Labs: 11/16/2023: ALT 13; BUN 25; Creatinine, Ser 1.78; Hemoglobin 14.4; Platelets 250.0; Potassium 3.6; Sodium 140; TSH 2.81  Recent Lipid Panel    Component Value Date/Time   CHOL 143 09/11/2020 1227   TRIG 98 09/11/2020 1227   HDL 56 09/11/2020 1227   CHOLHDL 2.6 09/11/2020 1227   CHOLHDL 1.9 08/31/2020 0335   VLDL 20 08/31/2020 0335   LDLCALC 69 09/11/2020 1227   LDLCALC 98 03/26/2020 1041   LDLDIRECT 171.5 12/06/2011 0812     Risk Assessment/Calculations:                Physical Exam:    VS:  BP 112/72   Pulse 62   Ht 5' 6 (1.676 m)   Wt 217 lb 12.8 oz (98.8 kg)   SpO2 98%   BMI 35.15 kg/m     Wt Readings from Last 3 Encounters:  05/30/24 217 lb 12.8 oz (98.8 kg)  05/18/24 217 lb 3.2 oz (98.5 kg)  05/03/24 214 lb 3.2 oz (97.2 kg)    GEN:  Well nourished, well developed in no acute distress HEENT: Normal NECK: No JVD; No carotid bruits LYMPHATICS: No lymphadenopathy CARDIAC: RRR, no murmurs, rubs, gallops RESPIRATORY:  Clear to auscultation without rales, wheezing or rhonchi  ABDOMEN: Soft, non-tender, non-distended MUSCULOSKELETAL:  No edema; No deformity  SKIN: Warm and dry NEUROLOGIC:  Alert and oriented x 3 PSYCHIATRIC:  Normal affect   ASSESSMENT:    1. Coronary artery disease involving native  coronary artery of native heart without angina pectoris   2. Hyperlipidemia associated with type 2 diabetes mellitus (HCC)   3. Primary hypertension    PLAN:    Coronary artery disease s/p DES to LCx Home meds: ASA 81 mg daily, Toprol  100 mg daily, Crestor  10 mg daily Denies any recent symptoms  Continue home medications  Will recheck lipid panel and follow up LDL to see if we need to start back on Repatha    Hypertension Home meds: hydrochlorothiazide  25 mg daily, Losartan  100 mg daily, Toprol  100 mg daily BP today 112/72 Denies any symptoms today  Continue home medications  Hyperlipidemia Home meds:  Crestor  10 mg daily Previously on Repatha , stopped due to running out of prescription LDL 32 as of 01/2023 Continue home medications  Will recheck lipid panel and follow up LDL to see if we need to start back on Repatha    Follow up: 6 months with Dr. Okey        Medication Adjustments/Labs and Tests Ordered: Current medicines are reviewed at length with the patient today.  Concerns regarding medicines are outlined above.  Orders Placed This Encounter  Procedures   Lipid panel   No orders of the defined types were placed in this encounter.   Patient Instructions  Medication Instructions:  Your physician recommends that you continue  on your current medications as directed. Please refer to the Current Medication list given to you today.  *If you need a refill on your cardiac medications before your next appointment, please call your pharmacy*  Lab Work: Fasting Lipid Panel If you have labs (blood work) drawn today and your tests are completely normal, you will receive your results only by: MyChart Message (if you have MyChart) OR A paper copy in the mail If you have any lab test that is abnormal or we need to change your treatment, we will call you to review the results.  Testing/Procedures: None ordered  Follow-Up: At [facility], you and your health needs  are our priority.  As part of our continuing mission to provide you with exceptional heart care, our providers are all part of one team.  This team includes your primary Cardiologist (physician) and Advanced Practice Providers or APPs (Physician Assistants and Nurse Practitioners) who all work together to provide you with the care you need, when you need it.  Your next appointment:   6 month(s)  Provider:   Vina Gull, MD    We recommend signing up for the patient portal called MyChart.  Sign up information is provided on this After Visit Summary.  MyChart is used to connect with patients for Virtual Visits (Telemedicine).  Patients are able to view lab/test results, encounter notes, upcoming appointments, etc.  Non-urgent messages can be sent to your provider as well.   To learn more about what you can do with MyChart, go to [email or web address].              Signed, Waddell DELENA Donath, PA-C  05/30/2024 4:07 PM    [facility]

## 2024-05-29 ENCOUNTER — Other Ambulatory Visit: Payer: Self-pay | Admitting: Internal Medicine

## 2024-05-29 DIAGNOSIS — E1169 Type 2 diabetes mellitus with other specified complication: Secondary | ICD-10-CM

## 2024-05-30 ENCOUNTER — Encounter: Payer: Self-pay | Admitting: Cardiology

## 2024-05-30 ENCOUNTER — Ambulatory Visit: Attending: Cardiology | Admitting: Cardiology

## 2024-05-30 VITALS — BP 112/72 | HR 62 | Ht 66.0 in | Wt 217.8 lb

## 2024-05-30 DIAGNOSIS — E785 Hyperlipidemia, unspecified: Secondary | ICD-10-CM | POA: Diagnosis not present

## 2024-05-30 DIAGNOSIS — I2510 Atherosclerotic heart disease of native coronary artery without angina pectoris: Secondary | ICD-10-CM | POA: Diagnosis not present

## 2024-05-30 DIAGNOSIS — I10 Essential (primary) hypertension: Secondary | ICD-10-CM

## 2024-05-30 DIAGNOSIS — E1169 Type 2 diabetes mellitus with other specified complication: Secondary | ICD-10-CM | POA: Diagnosis not present

## 2024-05-30 NOTE — Patient Instructions (Signed)
 Medication Instructions:  Your physician recommends that you continue on your current medications as directed. Please refer to the Current Medication list given to you today.  *If you need a refill on your cardiac medications before your next appointment, please call your pharmacy*  Lab Work: Fasting Lipid Panel If you have labs (blood work) drawn today and your tests are completely normal, you will receive your results only by: MyChart Message (if you have MyChart) OR A paper copy in the mail If you have any lab test that is abnormal or we need to change your treatment, we will call you to review the results.  Testing/Procedures: None ordered  Follow-Up: At [facility], you and your health needs are our priority.  As part of our continuing mission to provide you with exceptional heart care, our providers are all part of one team.  This team includes your primary Cardiologist (physician) and Advanced Practice Providers or APPs (Physician Assistants and Nurse Practitioners) who all work together to provide you with the care you need, when you need it.  Your next appointment:   6 month(s)  Provider:   Vina Gull, MD    We recommend signing up for the patient portal called MyChart.  Sign up information is provided on this After Visit Summary.  MyChart is used to connect with patients for Virtual Visits (Telemedicine).  Patients are able to view lab/test results, encounter notes, upcoming appointments, etc.  Non-urgent messages can be sent to your provider as well.   To learn more about what you can do with MyChart, go to [email or web address].

## 2024-05-31 ENCOUNTER — Ambulatory Visit: Admitting: Psychology

## 2024-05-31 ENCOUNTER — Other Ambulatory Visit: Payer: Self-pay | Admitting: Internal Medicine

## 2024-05-31 DIAGNOSIS — F330 Major depressive disorder, recurrent, mild: Secondary | ICD-10-CM | POA: Diagnosis not present

## 2024-05-31 NOTE — Progress Notes (Signed)
 [facility] Counselor/Therapist Progress Note  Patient ID: [name], MRN: [id number],    Date: 05/31/2024  Time Spent: 3:30pm-4:32pm  pt is seen for an in person visit.     Treatment Type: Individual Therapy  Reported Symptoms: pt reports grieving, some down feelings.  Pt also reports able to engage in some baking, going out.  Mental Status Exam: Appearance:  Well Groomed     Behavior: Appropriate  Motor: Normal  Speech/Language:  Normal Rate  Affect: Appropriate  Mood: depressed  Thought process: normal  Thought content:   WNL  Sensory/Perceptual disturbances:   WNL  Orientation: oriented to person, place, time/date, and situation  Attention: Good  Concentration: Good  Memory: WNL  Fund of knowledge:  Good  Insight:   Good  Judgment:  Good  Impulse Control: Good   Risk Assessment: Danger to Self:  No Self-injurious Behavior: No Danger to Others: No Duty to Warn:no Physical Aggression / Violence:No  Access to Firearms a concern: No  Gang Involvement:No   Subjective: Counselor assessed pt current functioning per pt report. Processed w/pt stressors, moods and grief.  Explored upcoming holidays and grief.  Explored ways of feeling  connected w/ son.  Discussed supports and positive interactions and engagement.    Discussed impact of recent illness and recovering for her body.   Encouraged positive connections and engagement w/ friends.  Pt affect wnl.  Pt reports she has been feeling down some days and recognizing really missing her son.  Pt discussed the ways they engaged during holidays and reminders of him in the home and things he enjoyed.  Pt discussed what her and daughter may do at Thanksgiving.  Pt reports positive w/ supports and how she is connected w/ them.  Pt reports she has done some baking, cooking and getting out w/ daughter.  Pt discussed not returning to in person church and some barriers w/ not agreeing w/ direction current pastor is taking  things.     Interventions: Cognitive Behavioral Therapy and supportive  Diagnosis:Mild episode of recurrent major depressive disorder  Plan: pt to f/u in 2-3weeks for counseling.  Pt to f/u as scheduled w/ PCP and psychiatrist as scheduled.      Individualized Treatment Plan Strengths: enjoys time w/ daughter, watching her t.v shows.  Baking/cooking at times.    Supports: her daughter, her cousin   Goal/Needs for Treatment:  In order of importance to patient 1) continue engagement in activities/ talk w/ friends 2) decreased depressed moods 3) work through Therapist, Art of Needs: continue to have support to cope w/ depression and grieve     Treatment Level:out pt counseling  Symptoms:depressed mood, tearfulness, loss of interest, difficulty w/ motivation, withdrawn from interacting.   Client Treatment Preferences:biweekly to monthly in person counseling.  Continue medication management w/ psychiatrist.    Healthcare consumer's goal for treatment:  Counselor, [name], [facility] will support the patient's ability to achieve the goals identified. Cognitive Behavioral Therapy, Assertive Communication/Conflict Resolution Training, Relaxation Training, ACT, Humanistic and other evidenced-based practices will be used to promote progress towards healthy functioning.   Healthcare consumer will: Actively participate in therapy, working towards healthy functioning.    *Justification for Continuation/Discontinuation of Goal: R=Revised, O=Ongoing, A=Achieved, D=Discontinued  Goal 1)  Increased engagement w/ activities she enjoys and w/ social interactions AEB pt report and counselor observation. Baseline date 12/29/23: Progress towards goal 50; How Often - Daily Target Date Goal Was reviewed Status Code Progress  towards goal/Likert rating  12/28/24                Goal 2) Increased coping w/ losses and w/ depressed moods AEB utilizing coping skills daily.   Baseline date 12/29/23:  Progress towards goal 0; How Often - Daily Target Date Goal Was reviewed Status Code Progress towards goal  12/28/24                  This plan has been reviewed and created by the following participants:  This plan will be reviewed at least every 12 months. Date Behavioral Health Clinician Date Guardian/Patient   12/29/23  [facility] 12/29/23 Verbal Consent Provided and electronic signature requested                        [name] L[facility]

## 2024-06-09 DIAGNOSIS — J449 Chronic obstructive pulmonary disease, unspecified: Secondary | ICD-10-CM | POA: Diagnosis not present

## 2024-06-10 ENCOUNTER — Other Ambulatory Visit: Payer: Self-pay | Admitting: Family Medicine

## 2024-06-10 DIAGNOSIS — J3089 Other allergic rhinitis: Secondary | ICD-10-CM

## 2024-06-22 DIAGNOSIS — E785 Hyperlipidemia, unspecified: Secondary | ICD-10-CM | POA: Diagnosis not present

## 2024-06-22 DIAGNOSIS — E1169 Type 2 diabetes mellitus with other specified complication: Secondary | ICD-10-CM | POA: Diagnosis not present

## 2024-06-23 LAB — LIPID PANEL
Chol/HDL Ratio: 2.4 ratio (ref 0.0–4.4)
Cholesterol, Total: 146 mg/dL (ref 100–199)
HDL: 61 mg/dL (ref >39)
LDL Chol Calc (NIH): 65 mg/dL (ref 0–99)
Triglycerides: 109 mg/dL (ref 0–149)
VLDL Cholesterol Cal: 20 mg/dL (ref 5–40)

## 2024-06-25 ENCOUNTER — Ambulatory Visit: Admitting: Psychology

## 2024-06-25 DIAGNOSIS — F330 Major depressive disorder, recurrent, mild: Secondary | ICD-10-CM | POA: Diagnosis not present

## 2024-06-25 NOTE — Progress Notes (Signed)
 [facility] Counselor/Therapist Progress Note  Patient ID: [name], MRN: [id number],    Date: 06/25/2024  Time Spent: 12:00pm-1:10pm  pt is seen for an in person visit.     Treatment Type: Individual Therapy  Reported Symptoms: pt reports sadness and missing her son.   Mental Status Exam: Appearance:  Well Groomed     Behavior: Appropriate  Motor: Normal  Speech/Language:  Normal Rate  Affect: Appropriate  Mood: depressed  Thought process: normal  Thought content:   WNL  Sensory/Perceptual disturbances:   WNL  Orientation: oriented to person, place, time/date, and situation  Attention: Good  Concentration: Good  Memory: WNL  Fund of knowledge:  Good  Insight:   Good  Judgment:  Good  Impulse Control: Good   Risk Assessment: Danger to Self:  No Self-injurious Behavior: No Danger to Others: No Duty to Warn:no Physical Aggression / Violence:No  Access to Firearms a concern: No  Gang Involvement:No   Subjective: Counselor assessed pt current functioning per pt report. Processed w/pt sadness and grief.  Validated and normalized feelings w/ grieving son at holidays.  Explored w/ pt ways she is remembering and feeling connected w/ son.  Discussed change in holidays as extended family ages.   Pt affect wnl.  Pt reports she has been feeling down.  Pt reports she is missing her son.  Pt reports difficult as first of holidays and that he really enjoyed this time of year.  Pt reports on memories and traditions w/her son.  Pt reports ways feeling connected to him and sharing in memories w/ her daughter.  Pt report was meaningful to get invitation from her son's friends holiday party and plans to attend.  Pt discussed how extended family hasn't reached out and feels some hurt from.  Pt discussed how things have changed as older generation is passing on and getting more disconnected from her nieces and nephews.    Interventions: Cognitive Behavioral Therapy and  supportive  Diagnosis:Mild episode of recurrent major depressive disorder  Plan: pt to f/u in 2-3weeks for counseling.  Pt to f/u as scheduled w/ PCP and psychiatrist as scheduled.      Individualized Treatment Plan Strengths: enjoys time w/ daughter, watching her t.v shows.  Baking/cooking at times.    Supports: her daughter, her cousin   Goal/Needs for Treatment:  In order of importance to patient 1) continue engagement in activities/ talk w/ friends 2) decreased depressed moods 3) work through Therapist, [employer] Needs: continue to have support to cope w/ depression and grieve     Treatment Level:out pt counseling  Symptoms:depressed mood, tearfulness, loss of interest, difficulty w/ motivation, withdrawn from interacting.   Client Treatment Preferences:biweekly to monthly in person counseling.  Continue medication management w/ psychiatrist.    Healthcare consumer's goal for treatment:  Counselor, [name], [facility] will support the patient's ability to achieve the goals identified. Cognitive Behavioral Therapy, Assertive Communication/Conflict Resolution Training, Relaxation Training, ACT, Humanistic and other evidenced-based practices will be used to promote progress towards healthy functioning.   Healthcare consumer will: Actively participate in therapy, working towards healthy functioning.    *Justification for Continuation/Discontinuation of Goal: R=Revised, O=Ongoing, A=Achieved, D=Discontinued  Goal 1)  Increased engagement w/ activities she enjoys and w/ social interactions AEB pt report and counselor observation. Baseline date 12/29/23: Progress towards goal 50; How Often - Daily Target Date Goal Was reviewed Status Code Progress towards goal/Likert rating  12/28/24  Goal 2) Increased coping w/ losses and w/ depressed moods AEB utilizing coping skills daily.   Baseline date 12/29/23: Progress towards goal 0; How Often - Daily Target Date Goal  Was reviewed Status Code Progress towards goal  12/28/24                  This plan has been reviewed and created by the following participants:  This plan will be reviewed at least every 12 months. Date Behavioral Health Clinician Date Guardian/Patient   12/29/23  [facility] Barbarann [facility] 12/29/23 Verbal Consent Provided and electronic signature requested                      [name] LCM[facility]

## 2024-06-26 ENCOUNTER — Ambulatory Visit: Payer: Self-pay | Admitting: Cardiology

## 2024-06-27 ENCOUNTER — Other Ambulatory Visit: Payer: Self-pay | Admitting: Family Medicine

## 2024-06-27 ENCOUNTER — Other Ambulatory Visit: Payer: Self-pay | Admitting: Gastroenterology

## 2024-06-27 DIAGNOSIS — K219 Gastro-esophageal reflux disease without esophagitis: Secondary | ICD-10-CM

## 2024-06-29 ENCOUNTER — Ambulatory Visit
Admission: RE | Admit: 2024-06-29 | Discharge: 2024-06-29 | Disposition: A | Discharge status: Home / Self Care | Source: Ambulatory Visit

## 2024-06-29 ENCOUNTER — Other Ambulatory Visit: Payer: Self-pay

## 2024-06-29 VITALS — BP 119/78 | HR 63 | Temp 97.7°F | Resp 18

## 2024-06-29 DIAGNOSIS — T148XXA Other injury of unspecified body region, initial encounter: Secondary | ICD-10-CM | POA: Diagnosis not present

## 2024-06-29 DIAGNOSIS — R81 Glycosuria: Secondary | ICD-10-CM

## 2024-06-29 LAB — POCT URINE DIPSTICK
Bilirubin, UA: NEGATIVE
Blood, UA: NEGATIVE
Glucose, UA: 500 mg/dL — AB
Ketones, POC UA: NEGATIVE mg/dL
Leukocytes, UA: NEGATIVE
Nitrite, UA: NEGATIVE
Protein Ur, POC: NEGATIVE mg/dL
Spec Grav, UA: 1.015 (ref 1.010–1.025)
Urobilinogen, UA: 0.2 U/dL
pH, UA: 5.5 (ref 5.0–8.0)

## 2024-06-29 LAB — GLUCOSE, POCT (MANUAL RESULT ENTRY): POC Glucose: 154 mg/dL — AB (ref 70–99)

## 2024-06-29 MED ORDER — CYCLOBENZAPRINE HCL 10 MG PO TABS: 10.0000 mg | ORAL_TABLET | Freq: Two times a day (BID) | ORAL | 0 refills | Status: AC | PRN

## 2024-06-29 NOTE — ED Triage Notes (Signed)
 Pt c/o right flank pain. Pt states she was trying to get a dish out on a top shelf on Wednesday and woke up yesterday morning with the right flank pain.  Pt has 1+ swelling in the right flank.

## 2024-06-29 NOTE — Discharge Instructions (Signed)
  1. Glucose found in urine on examination - POCT CBG (manual entry) completed in UC shows blood glucose of 154 mg/dL.  Most likely glucose in urine is secondary to prescribed Farxiga for diabetes control.  2. Muscle strain (Primary) - POCT URINE DIPSTICK completed in UC shows no leukocytes, no nitrite, no blood, greater than 500 glucose most likely secondary to prescribed Farxiga. - cyclobenzaprine  (FLEXERIL ) 10 MG tablet; Take 1 tablet (10 mg total) by mouth 2 (two) times daily as needed for muscle spasms.  Dispense: 20 tablet; Refill: 0 - Continue taking Tylenol  as needed for pain secondary to mid back muscle strain.  Due to renal impairment steroids and/or NSAIDs are not recommended for treatment of muscle strain related pain and inflammation.  -Continue to monitor symptoms for any change in severity if there is any escalation of current symptoms or development of new symptoms follow-up in ER for further evaluation and management.

## 2024-06-29 NOTE — ED Provider Notes (Signed)
 [facility]  Note:  This document was prepared using Dragon voice recognition software and may include unintentional dictation errors.  MRN: [id number] DOB: 10/06/1945  Subjective:   [name] is a 78 y.o. female presenting for evaluation of right flank pain since yesterday.  Patient reports that on Wednesday she was trying to get a dish out of the top shelf and believes that she may have strained a muscle reaching.  Patient reports she woke up yesterday morning with right flank pain that has not subsided.  Patient reports mild swelling to the area.  Patient has been taking Tylenol  with minimal improvement to symptoms also took a muscle relaxant that she had at home with mild improvement.  Patient denies any known injury or trauma but thinks that she may have strained her muscle.  Patient denies any urinary difficulty, no dysuria or abdominal pain.  No current facility-administered medications for this encounter.  Current Outpatient Medications:    cyclobenzaprine  (FLEXERIL ) 10 MG tablet, Take 1 tablet (10 mg total) by mouth 2 (two) times daily as needed for muscle spasms., Disp: 20 tablet, Rfl: 0   dapagliflozin propanediol (FARXIGA) 10 MG TABS tablet, Take by mouth daily., Disp: , Rfl:    acetaminophen  (TYLENOL ) 500 MG tablet, Take 1,000 mg by mouth as needed for moderate pain or headache. , Disp: , Rfl:    albuterol  (VENTOLIN  HFA) 108 (90 Base) MCG/ACT inhaler, Inhale 2 puffs into the lungs every 6 (six) hours as needed for wheezing or shortness of breath., Disp: 8 g, Rfl: 12   Aspirin  81 MG CAPS, Take 81 mg by mouth daily., Disp: , Rfl:    azelastine  (ASTELIN ) 0.1 % nasal spray, Place 2 sprays into both nostrils 2 (two) times daily. Use in each nostril as directed, Disp: 30 mL, Rfl: 5   benzonatate  (TESSALON ) 100 MG capsule, Take 1 capsule (100 mg total) by mouth 3 (three) times daily as needed for cough. (Patient not taking: Reported on 05/30/2024), Disp: 30 capsule, Rfl: 0    famotidine  (PEPCID ) 20 MG tablet, TAKE ONE (1) TABLET BY MOUTH TWICE DAILY IN THE MORNING AND AT BEDTIME, Disp: 180 tablet, Rfl: 1   fluticasone  (FLONASE ) 50 MCG/ACT nasal spray, PLACE 2 SPRAYS INTO BOTH NOSTRILS AT BEDTIME AS NEEDED FOR ALLERGIES., Disp: 48 mL, Rfl: 1   fluticasone -salmeterol (ADVAIR HFA) 115-21 MCG/ACT inhaler, Inhale 2 puffs into the lungs 2 (two) times daily., Disp: 1 each, Rfl: 0   hydrochlorothiazide  (HYDRODIURIL ) 25 MG tablet, TAKE 1 TABLET BY MOUTH ONCE DAILY, Disp: 90 tablet, Rfl: 2   ipratropium (ATROVENT ) 0.06 % nasal spray, Place 2 sprays into both nostrils 4 (four) times daily., Disp: 15 mL, Rfl: 1   ipratropium-albuterol  (DUONEB) 0.5-2.5 (3) MG/3ML SOLN, 1 neb every 6 hours if needed for breathing, Disp: 360 mL, Rfl: 12   losartan  (COZAAR ) 100 MG tablet, TAKE 1 TABLET BY MOUTH EVERY MORNING, Disp: 90 tablet, Rfl: 2   meclizine  (ANTIVERT ) 25 MG tablet, Take 1 tablet (25 mg total) by mouth 3 (three) times daily as needed for dizziness., Disp: 30 tablet, Rfl: 0   metoprolol  succinate (TOPROL -XL) 100 MG 24 hr tablet, TAKE 1 TABLET BY MOUTH DAILY AT NOON, Disp: 90 tablet, Rfl: 2   nitroGLYCERIN  (NITROSTAT ) 0.4 MG SL tablet, place 1 tablet under the tongue every 5 minutes as needed for chest pain, Disp: 25 tablet, Rfl: 0   pantoprazole  (PROTONIX ) 40 MG tablet, TAKE 1 TABLET BY MOUTH ONCE DAILY, Disp: 30 tablet, Rfl:  9   promethazine -dextromethorphan (PROMETHAZINE -DM) 6.25-15 MG/5ML syrup, Take 5 mLs by mouth at bedtime as needed for cough. (Patient not taking: Reported on 05/30/2024), Disp: 100 mL, Rfl: 0   rosuvastatin  (CRESTOR ) 10 MG tablet, TAKE 1 TABLET BY MOUTH EVERY NIGHT AT BEDTIME, Disp: 90 tablet, Rfl: 2   sertraline  (ZOLOFT ) 100 MG tablet, Take 100 mg by mouth daily., Disp: , Rfl:    Allergies  Allergen Reactions   Oxycodone -Acetaminophen  Hives and Itching    hallucinations   Percocet [Oxycodone -Acetaminophen ] Hives, Itching and Other (See Comments)     hallucinations   Pravastatin  Sodium Other (See Comments)    Muscle aches Muscle aches   Hydrocodone  Other (See Comments)    crazy dreams   Aspirin  Other (See Comments)    GI upset Other reaction(s): Other (See Comments) REACTION: GI upset   Lexapro [Escitalopram] Itching    Pt reports caused itching and breaking out.       Past Medical History:  Diagnosis Date   Allergy     Angio-edema    Anxiety    C. difficile diarrhea    history of   CAD (coronary artery disease), native coronary artery    a. 05/2017 showed 100% mLCx tx with DES, otherwise 20% mRCA, 50% ramus, 30% dLAD, 20% prox-mid LAD, 20% mLAD treated medically.    Cancer [facility])    Right kidney CA removed.    CKD (chronic kidney disease) stage 3, GFR 30-59 ml/min (HCC) 10/11/2016   s/p R nephrectomy   Colon polyps 2008   HYPERPLASTIC   COPD (chronic obstructive pulmonary disease) (HCC)    former smoker   Depression    Gastritis    GERD (gastroesophageal reflux disease)    History of echocardiogram    Echo 3/18:  Moderate LVH, EF 60-65, grade 1 diastolic dysfunction, calcified aortic valve, mild MR, moderate LAE   History of nuclear stress test    Myoview  3/18: Mod size and intensity fixed septal defect, may be artifact.Opposite mod size and intensity lat defect, which is reversible and could represent ischemia or possibly artifact (SDS 4). LVEF 71% with normal wall motion. Intermediate risk study. >> images reviewed with Dr. Vina Gull - no sig ischemia; med rx    Hx of cardiovascular stress test    Lexiscan  Myoview  6/16:  EF 70%, no scar or ischemia; Low Risk   Hyperlipidemia    Hypertension    Myocardial infarction (HCC)    2018   Neuropathy    Orthostatic hypotension 05/28/2017   OSA (obstructive sleep apnea) 01/16/2021   Osteoarthritis    Pre-diabetes    no meds   Recurrent upper respiratory infection (URI)    Rheumatoid arthritis (HCC)    RA (Dr. Lenon) Bilateral hands   S/P angioplasty with stent  05/27/17 to LCX with DES  05/28/2017   Sleep apnea 2023   does not use CPAP   Thyroid  disease      Past Surgical History:  Procedure Laterality Date   ABDOMINAL HYSTERECTOMY  1978   ANTERIOR CERVICAL DECOMP/DISCECTOMY FUSION N/A 05/29/2018   Procedure: ANTERIOR CERVICAL DECOMPRESSION FUSION - CERVICAL FIVE-CERVICAL SIX - CERVICAL SIX-CERVICAL SEVEN;  Surgeon: Louis Shove, MD;  Location: [facility] OR;  Service: Neurosurgery;  Laterality: N/A;   BACK SURGERY     x 2   BREAST LUMPECTOMY WITH RADIOACTIVE SEED LOCALIZATION Right 04/13/2022   Procedure: RIGHT BREAST SEED LUMPECTOMY;  Surgeon: Vanderbilt Ned, MD;  Location: [facility];  Service: General;  Laterality: Right;  CARPAL TUNNEL RELEASE Left    COLONOSCOPY     COLONOSCOPY W/ BIOPSIES AND POLYPECTOMY     Hx: of   CORONARY STENT INTERVENTION N/A 05/27/2017   Procedure: CORONARY STENT INTERVENTION;  Surgeon: Verlin Lonni BIRCH, MD;  Location: [facility] INVASIVE CV LAB;  Service: Cardiovascular;  Laterality: N/A;   ESOPHAGOGASTRODUODENOSCOPY     HNP     LEFT HEART CATH AND CORONARY ANGIOGRAPHY N/A 05/27/2017   Procedure: LEFT HEART CATH AND CORONARY ANGIOGRAPHY;  Surgeon: Verlin Lonni BIRCH, MD;  Location: [facility];  Service: Cardiovascular;  Laterality: N/A;   LUMBAR LAMINECTOMY/DECOMPRESSION MICRODISCECTOMY Left 02/12/2013   Procedure: LUMBAR TWO THREE, LUMBAR THREE FOUR, LUMBAR FOUR FIVE  LAMINECTOMY/DECOMPRESSION MICRODISCECTOMY 3 LEVELS;  Surgeon: Victory DELENA Gunnels, MD;  Location: [facility] NEURO ORS;  Service: Neurosurgery;  Laterality: Left;   NEPHRECTOMY Right 2010   10.rcc cancer   TOTAL KNEE ARTHROPLASTY Right    Redo   UPPER GASTROINTESTINAL ENDOSCOPY      Family History  Problem Relation Age of Onset   Rheum arthritis Mother    Stroke Mother    Prostate cancer Father    Heart disease Father    Diabetes Brother    Dementia Brother    Parkinsonism Brother    Diabetes Brother    Cancer Brother     Dementia Brother    Kidney disease Son    Bipolar disorder Daughter    Colon cancer Neg Hx    Esophageal cancer Neg Hx    Pancreatic cancer Neg Hx    Liver disease Neg Hx    Rectal cancer Neg Hx    Stomach cancer Neg Hx     Social History   Tobacco Use   Smoking status: Former    Current packs/day: 0.00    Average packs/day: 0.2 packs/day for 53.1 years (10.6 ttl pk-yrs)    Types: Cigarettes    Start date: 1    Quit date: 09/07/2021    Years since quitting: 2.8   Smokeless tobacco: Never   Tobacco comments:    pt has been an on and off smoker since age 28  Vaping Use   Vaping status: Never Used  Substance Use Topics   Alcohol use: No    Alcohol/week: 0.0 standard drinks of alcohol   Drug use: No    ROS Refer to HPI for ROS details.  Objective:    Vitals: BP 119/78   Pulse 63   Temp 97.7 F (36.5 C) (Oral)   Resp 18   SpO2 93%   Physical Exam Vitals and nursing note reviewed.  Constitutional:      General: She is not in acute distress.    Appearance: Normal appearance. She is well-developed. She is not ill-appearing or toxic-appearing.  HENT:     Head: Normocephalic and atraumatic.     Nose: Nose normal.     Mouth/Throat:     Mouth: Mucous membranes are moist.     Pharynx: Oropharynx is clear.  Cardiovascular:     Rate and Rhythm: Normal rate.  Pulmonary:     Effort: Pulmonary effort is normal. No respiratory distress.  Abdominal:     General: There is no distension.     Tenderness: There is no abdominal tenderness. There is right CVA tenderness. There is no left CVA tenderness.  Skin:    General: Skin is warm and dry.  Neurological:     General: No focal deficit present.     Mental Status: She  is alert and oriented to person, place, and time.  Psychiatric:        Mood and Affect: Mood normal.        Behavior: Behavior normal.     Procedures  Results for orders placed or performed during the hospital encounter of 06/29/24 (from the past 24  hours)  POCT URINE DIPSTICK     Status: Abnormal   Collection Time: 06/29/24  3:14 PM  Result Value Ref Range   Color, UA yellow yellow   Clarity, UA clear clear   Glucose, UA =500 (A) negative mg/dL   Bilirubin, UA negative negative   Ketones, POC UA negative negative mg/dL   Spec Grav, UA 8.984 8.989 - 1.025   Blood, UA negative negative   pH, UA 5.5 5.0 - 8.0   Protein Ur, POC negative negative mg/dL   Urobilinogen, UA 0.2 0.2 or 1.0 E.U./dL   Nitrite, UA Negative Negative   Leukocytes, UA Negative Negative  POCT CBG (manual entry)     Status: Abnormal   Collection Time: 06/29/24  3:39 PM  Result Value Ref Range   POC Glucose 154 (A) 70 - 99 mg/dl   *Note: Due to a large number of results and/or encounters for the requested time period, some results have not been displayed. A complete set of results can be found in Results Review.    Assessment and Plan :     Discharge Instructions       1. Glucose found in urine on examination - POCT CBG (manual entry) completed in UC shows blood glucose of 154 mg/dL.  Most likely glucose in urine is secondary to prescribed Farxiga for diabetes control.  2. Muscle strain (Primary) - POCT URINE DIPSTICK completed in UC shows no leukocytes, no nitrite, no blood, greater than 500 glucose most likely secondary to prescribed Farxiga. - cyclobenzaprine  (FLEXERIL ) 10 MG tablet; Take 1 tablet (10 mg total) by mouth 2 (two) times daily as needed for muscle spasms.  Dispense: 20 tablet; Refill: 0 - Continue taking Tylenol  as needed for pain secondary to mid back muscle strain.  Due to renal impairment steroids and/or NSAIDs are not recommended for treatment of muscle strain related pain and inflammation.  -Continue to monitor symptoms for any change in severity if there is any escalation of current symptoms or development of new symptoms follow-up in ER for further evaluation and management.       Leighton Brickley B Yuriel Lopezmartinez   [place], [place],  [place] 06/29/24 [id number]

## 2024-07-09 DIAGNOSIS — J449 Chronic obstructive pulmonary disease, unspecified: Secondary | ICD-10-CM | POA: Diagnosis not present

## 2024-07-10 DIAGNOSIS — F331 Major depressive disorder, recurrent, moderate: Secondary | ICD-10-CM | POA: Diagnosis not present

## 2024-07-10 DIAGNOSIS — F4381 Prolonged grief disorder: Secondary | ICD-10-CM | POA: Diagnosis not present

## 2024-07-18 DIAGNOSIS — M1712 Unilateral primary osteoarthritis, left knee: Secondary | ICD-10-CM | POA: Diagnosis not present

## 2024-07-23 ENCOUNTER — Ambulatory Visit: Admitting: Psychology

## 2024-07-23 DIAGNOSIS — F330 Major depressive disorder, recurrent, mild: Secondary | ICD-10-CM | POA: Diagnosis not present

## 2024-07-23 NOTE — Progress Notes (Signed)
 "     [facility] Counselor/Therapist Progress Note  Patient ID: [name], MRN: [id number],    Date: 07/23/2024  Time Spent: 2:33pm-3:33pm  pt is seen for an in person visit.     Treatment Type: Individual Therapy  Reported Symptoms: pt reports sadness and grieving son at holidays.   Mental Status Exam: Appearance:  Well Groomed     Behavior: Appropriate  Motor: Normal  Speech/Language:  Normal Rate  Affect: Appropriate  Mood: sad  Thought process: normal  Thought content:   WNL  Sensory/Perceptual disturbances:   WNL  Orientation: oriented to person, place, time/date, and situation  Attention: Good  Concentration: Good  Memory: WNL  Fund of knowledge:  Good  Insight:   Good  Judgment:  Good  Impulse Control: Good   Risk Assessment: Danger to Self:  No Self-injurious Behavior: No Danger to Others: No Duty to Warn:no Physical Aggression / Violence:No  Access to Firearms a concern: No  Gang Involvement:No   Subjective: Counselor assessed pt current functioning per pt report. Processed w/pt holidays and grief.  Validated and normalized feelings w/ grieving son during holidays and at his birthday.  Explored w/ pt ways she is connecting w/ memories and sharing w those who also part of his life.   Pt affect wnl.  Pt reports her son has been on her mind a lot and seems to feel that everything is a reminder.  Pt shared some stories of son w/ the holidays and at his birthday.  Pt reflects on how she is able to share w/ her daughter and acknowledge feelings.  Pt also feels positive about son's friends who have reached out during this time and include her and daughter.  Pt reports she also is trying to keep busy w/ things to keep moving and not ruminate.  Interventions: Cognitive Behavioral Therapy and supportive  Diagnosis:Mild episode of recurrent major depressive disorder  Plan: pt to f/u in 2-3weeks for counseling.  Pt to f/u as scheduled w/ PCP and  psychiatrist as scheduled.      Individualized Treatment Plan Strengths: enjoys time w/ daughter, watching her t.v shows.  Baking/cooking at times.    Supports: her daughter, her cousin   Goal/Needs for Treatment:  In order of importance to patient 1) continue engagement in activities/ talk w/ friends 2) decreased depressed moods 3) work through Therapist, [employer] Needs: continue to have support to cope w/ depression and grieve     Treatment Level:out pt counseling  Symptoms:depressed mood, tearfulness, loss of interest, difficulty w/ motivation, withdrawn from interacting.   Client Treatment Preferences:biweekly to monthly in person counseling.  Continue medication management w/ psychiatrist.    Healthcare consumer's goal for treatment:  Counselor, [name], [facility] will support the patient's ability to achieve the goals identified. Cognitive Behavioral Therapy, Assertive Communication/Conflict Resolution Training, Relaxation Training, ACT, Humanistic and other evidenced-based practices will be used to promote progress towards healthy functioning.   Healthcare consumer will: Actively participate in therapy, working towards healthy functioning.    *Justification for Continuation/Discontinuation of Goal: R=Revised, O=Ongoing, A=Achieved, D=Discontinued  Goal 1)  Increased engagement w/ activities she enjoys and w/ social interactions AEB pt report and counselor observation. Baseline date 12/29/23: Progress towards goal 50; How Often - Daily Target Date Goal Was reviewed Status Code Progress towards goal/Likert rating  12/28/24                Goal 2) Increased coping w/ losses and  w/ depressed moods AEB utilizing coping skills daily.   Baseline date 12/29/23: Progress towards goal 0; How Often - Daily Target Date Goal Was reviewed Status Code Progress towards goal  12/28/24                  This plan has been reviewed and created by the following participants:  This  plan will be reviewed at least every 12 months. Date Behavioral Health Clinician Date Guardian/Patient   12/29/23  [facility] Barbarann [facility] 12/29/23 Verbal Consent Provided and electronic signature requested                       [name] [facility] "

## 2024-07-30 ENCOUNTER — Other Ambulatory Visit: Payer: Self-pay | Admitting: Gastroenterology

## 2024-07-30 DIAGNOSIS — K219 Gastro-esophageal reflux disease without esophagitis: Secondary | ICD-10-CM

## 2024-08-01 ENCOUNTER — Ambulatory Visit: Payer: Self-pay

## 2024-08-06 ENCOUNTER — Ambulatory Visit: Admitting: Psychology

## 2024-08-15 ENCOUNTER — Ambulatory Visit: Payer: Self-pay

## 2024-08-16 ENCOUNTER — Telehealth: Payer: Self-pay | Admitting: Family Medicine

## 2024-08-16 ENCOUNTER — Ambulatory Visit: Admitting: Psychology

## 2024-08-16 DIAGNOSIS — F330 Major depressive disorder, recurrent, mild: Secondary | ICD-10-CM | POA: Diagnosis not present

## 2024-08-16 NOTE — Progress Notes (Signed)
 "     [name] Counselor/Therapist Progress Note  Patient ID: [name], MRN: [id number],    Date: 08/16/2024  Time Spent: 11:00am-12:00pm  pt is seen for an in person visit.     Treatment Type: Individual Therapy  Reported Symptoms: pt reports some increased feeling down/sadness   Mental Status Exam: Appearance:  Well Groomed     Behavior: Appropriate  Motor: Normal  Speech/Language:  Normal Rate  Affect: Appropriate  Mood: sad  Thought process: normal  Thought content:   WNL  Sensory/Perceptual disturbances:   WNL  Orientation: oriented to person, place, time/date, and situation  Attention: Good  Concentration: Good  Memory: WNL  Fund of knowledge:  Good  Insight:   Good  Judgment:  Good  Impulse Control: Good   Risk Assessment: Danger to Self:  No Self-injurious Behavior: No Danger to Others: No Duty to Warn:no Physical Aggression / Violence:No  Access to Firearms a concern: No  Gang Involvement:No   Subjective: Counselor assessed pt current functioning per pt report. Processed w/pt increased feeling down and process of grief.  Validated and normalized emotions w/ grieving son during holidays and at his birthday.  Discussed connecting w/ others and support feels w/ those connections. Pt affect congruent w/ report of feeling down.  Pt reports over past couple of weeks noticing more down.  Pt acknowledged that likely grief and grieving son at holidays and his birthday.  Pt reports that was able to see her niece recent and that was positive and some positives w/ talking w/ work friend.    Interventions: Cognitive Behavioral Therapy and supportive  Diagnosis:Mild episode of recurrent major depressive disorder  Plan: pt to f/u in 2-3weeks for counseling.  Pt to f/u as scheduled w/ PCP and psychiatrist as scheduled.      Individualized Treatment Plan Strengths: enjoys time w/ daughter, watching her t.v shows.  Baking/cooking at times.    Supports:  her daughter, her cousin   Goal/Needs for Treatment:  In order of importance to patient 1) continue engagement in activities/ talk w/ friends 2) decreased depressed moods 3) work through Therapist, Art of Needs: continue to have support to cope w/ depression and grieve     Treatment Level:out pt counseling  Symptoms:depressed mood, tearfulness, loss of interest, difficulty w/ motivation, withdrawn from interacting.   Client Treatment Preferences:biweekly to monthly in person counseling.  Continue medication management w/ psychiatrist.    Healthcare consumer's goal for treatment:  Counselor, [name], [facility] will support the patient's ability to achieve the goals identified. Cognitive Behavioral Therapy, Assertive Communication/Conflict Resolution Training, Relaxation Training, ACT, Humanistic and other evidenced-based practices will be used to promote progress towards healthy functioning.   Healthcare consumer will: Actively participate in therapy, working towards healthy functioning.    *Justification for Continuation/Discontinuation of Goal: R=Revised, O=Ongoing, A=Achieved, D=Discontinued  Goal 1)  Increased engagement w/ activities she enjoys and w/ social interactions AEB pt report and counselor observation. Baseline date 12/29/23: Progress towards goal 50; How Often - Daily Target Date Goal Was reviewed Status Code Progress towards goal/Likert rating  12/28/24                Goal 2) Increased coping w/ losses and w/ depressed moods AEB utilizing coping skills daily.   Baseline date 12/29/23: Progress towards goal 0; How Often - Daily Target Date Goal Was reviewed Status Code Progress towards goal  12/28/24  This plan has been reviewed and created by the following participants:  This plan will be reviewed at least every 12 months. Date Behavioral Blackwell Clinician Date Guardian/Patient   12/29/23  [facility] [name] [facility] 12/29/23 Verbal Consent  Provided and electronic signature requested                          [name] [facility] "

## 2024-08-16 NOTE — Telephone Encounter (Signed)
 Copied from CRM [id number]. Topic: General - Other >> Aug 16, 2024  1:52 PM Zy'onna H wrote: Reason for CRM: Patient representative is calling from [facility] regarding this patients Medical clearance form.  She called to verify our fax number as well as check on the status of the Form.  I called the CAL, but was unable to reach anyone, as they're on Lunch.  Please give her a callback ASAP regarding Medical Clearance form and its status.  She stated the form was Faxed to the clinic on: 08/09/2024.   Direct Line: [phone]

## 2024-08-17 NOTE — Telephone Encounter (Signed)
 Spoke with [facility] informed them that I have not received a fax from them and asked if they could send it again. She resent it awaiting its arrival via fax.

## 2024-08-20 ENCOUNTER — Ambulatory Visit: Admitting: Psychology

## 2024-08-20 NOTE — Telephone Encounter (Signed)
 Paper/form has been received and is on Dr. Gib desk.

## 2024-08-22 NOTE — Telephone Encounter (Signed)
 She is going to need labs,  I saw her last in 05/2024, so she needs an appt for pre op. Thanks, BJ

## 2024-08-22 NOTE — Progress Notes (Unsigned)
 "  Office Visit Note  Patient: [name]             Date of Birth: 1945-12-16           MRN: [id number]             PCP: Jordan, [name], MD Referring: Jordan, [name], MD Visit Date: 09/05/2024 Occupation: Data Unavailable  Subjective:  No chief complaint on file.   History of Present Illness: [name] is a 79 y.o. female ***     Activities of Daily Living:  Patient reports morning stiffness for *** {minute/hour:19697}.   Patient {ACTIONS;DENIES/REPORTS:21021675::Denies} nocturnal pain.  Difficulty dressing/grooming: {ACTIONS;DENIES/REPORTS:21021675::Denies} Difficulty climbing stairs: {ACTIONS;DENIES/REPORTS:21021675::Denies} Difficulty getting out of chair: {ACTIONS;DENIES/REPORTS:21021675::Denies} Difficulty using hands for taps, buttons, cutlery, and/or writing: {ACTIONS;DENIES/REPORTS:21021675::Denies}  No Rheumatology ROS completed.   PMFS History:  Patient Active Problem List   Diagnosis Date Noted   Dizziness 01/11/2024   Adrenal adenoma, left 07/09/2023   Atherosclerosis of aorta 07/08/2023   Local reaction to COVID-19 vaccine 10/12/2022   Moderate episode of recurrent major depressive disorder (HCC) 03/24/2022   PAD (peripheral artery disease) 11/28/2021   OSA (obstructive sleep apnea) 01/16/2021   COPD mixed type (HCC) 01/16/2021   Fatigue 11/03/2020   B12 deficiency 11/03/2020   Chest pain 08/30/2020   Personal history of malignant neoplasm of kidney 09/26/2019   Vitamin D  deficiency, unspecified 09/26/2019   Anxiety disorder, unspecified 03/26/2019   Tobacco use disorder 09/29/2018   Cervical myelopathy (HCC) 05/29/2018   ANA positive 12/08/2017   Cervicalgia 09/16/2017   Carpal tunnel syndrome of left wrist 07/21/2017   Chondrocalcinosis 07/21/2017   Neuropathy 07/21/2017   Orthostatic hypotension 05/28/2017   S/P angioplasty with stent 05/27/17 to LCX with DES  05/28/2017   CAD (coronary artery disease)    Polyneuropathy 05/25/2017    Chest pain with moderate risk for cardiac etiology 05/25/2017   Class 1 obesity with serious comorbidity and body mass index (BMI) of 34.0 to 34.9 in adult 12/06/2016   CKD (chronic kidney disease), stage IV (HCC) 10/11/2016   Hot flashes, menopausal 10/01/2016   Chest pain with moderate risk of acute coronary syndrome 10/25/2014   Degenerative spondylolisthesis 10/15/2014   Spinal stenosis, lumbar region, with neurogenic claudication 02/12/2013   DM (diabetes mellitus), type 2 with renal complications (HCC) 03/05/2010   History of Rtr nephrectomy 2010, secondary to renal cell cancer 01/14/2009   History of colonic polyps 11/14/2008   Hyperlipidemia associated with type 2 diabetes mellitus (HCC) 11/16/2007   ESOPHAGEAL STRICTURE 10/12/2007   Hypertension with heart disease 01/19/2007   Allergic rhinitis 01/19/2007   GERD 01/19/2007    Past Medical History:  Diagnosis Date   Allergy     Angio-edema    Anxiety    C. difficile diarrhea    history of   CAD (coronary artery disease), native coronary artery    a. 05/2017 showed 100% mLCx tx with DES, otherwise 20% mRCA, 50% ramus, 30% dLAD, 20% prox-mid LAD, 20% mLAD treated medically.    Cancer [facility])    Right kidney CA removed.    CKD (chronic kidney disease) stage 3, GFR 30-59 ml/min (HCC) 10/11/2016   s/p R nephrectomy   Colon polyps 2008   HYPERPLASTIC   COPD (chronic obstructive pulmonary disease) (HCC)    former smoker   Depression    Gastritis    GERD (gastroesophageal reflux disease)    History of echocardiogram    Echo 3/18:  Moderate LVH, EF 60-65,  grade 1 diastolic dysfunction, calcified aortic valve, mild MR, moderate LAE   History of nuclear stress test    Myoview  3/18: Mod size and intensity fixed septal defect, may be artifact.Opposite mod size and intensity lat defect, which is reversible and could represent ischemia or possibly artifact (SDS 4). LVEF 71% with normal wall motion. Intermediate risk study. >> images  reviewed with Dr. Vina Gull - no sig ischemia; med rx    Hx of cardiovascular stress test    Lexiscan  Myoview  6/16:  EF 70%, no scar or ischemia; Low Risk   Hyperlipidemia    Hypertension    Myocardial infarction (HCC)    2018   Neuropathy    Orthostatic hypotension 05/28/2017   OSA (obstructive sleep apnea) 01/16/2021   Osteoarthritis    Pre-diabetes    no meds   Recurrent upper respiratory infection (URI)    Rheumatoid arthritis (HCC)    RA (Dr. Lenon) Bilateral hands   S/P angioplasty with stent 05/27/17 to LCX with DES  05/28/2017   Sleep apnea 2023   does not use CPAP   Thyroid  disease     Family History  Problem Relation Age of Onset   Rheum arthritis Mother    Stroke Mother    Prostate cancer Father    Heart disease Father    Diabetes Brother    Dementia Brother    Parkinsonism Brother    Diabetes Brother    Cancer Brother    Dementia Brother    Kidney disease Son    Bipolar disorder Daughter    Colon cancer Neg Hx    Esophageal cancer Neg Hx    Pancreatic cancer Neg Hx    Liver disease Neg Hx    Rectal cancer Neg Hx    Stomach cancer Neg Hx    Past Surgical History:  Procedure Laterality Date   ABDOMINAL HYSTERECTOMY  1978   ANTERIOR CERVICAL DECOMP/DISCECTOMY FUSION N/A 05/29/2018   Procedure: ANTERIOR CERVICAL DECOMPRESSION FUSION - CERVICAL FIVE-CERVICAL SIX - CERVICAL SIX-CERVICAL SEVEN;  Surgeon: Louis Shove, MD;  Location: [facility] OR;  Service: Neurosurgery;  Laterality: N/A;   BACK SURGERY     x 2   BREAST LUMPECTOMY WITH RADIOACTIVE SEED LOCALIZATION Right 04/13/2022   Procedure: RIGHT BREAST SEED LUMPECTOMY;  Surgeon: Vanderbilt Ned, MD;  Location: [facility];  Service: General;  Laterality: Right;   CARPAL TUNNEL RELEASE Left    COLONOSCOPY     COLONOSCOPY W/ BIOPSIES AND POLYPECTOMY     Hx: of   CORONARY STENT INTERVENTION N/A 05/27/2017   Procedure: CORONARY STENT INTERVENTION;  Surgeon: Verlin Lonni BIRCH, MD;  Location:  [facility];  Service: Cardiovascular;  Laterality: N/A;   ESOPHAGOGASTRODUODENOSCOPY     HNP     LEFT HEART CATH AND CORONARY ANGIOGRAPHY N/A 05/27/2017   Procedure: LEFT HEART CATH AND CORONARY ANGIOGRAPHY;  Surgeon: Verlin Lonni BIRCH, MD;  Location: [facility];  Service: Cardiovascular;  Laterality: N/A;   LUMBAR LAMINECTOMY/DECOMPRESSION MICRODISCECTOMY Left 02/12/2013   Procedure: LUMBAR TWO THREE, LUMBAR THREE FOUR, LUMBAR FOUR FIVE  LAMINECTOMY/DECOMPRESSION MICRODISCECTOMY 3 LEVELS;  Surgeon: Shove DELENA Louis, MD;  Location: [facility];  Service: Neurosurgery;  Laterality: Left;   NEPHRECTOMY Right 2010   10.rcc cancer   TOTAL KNEE ARTHROPLASTY Right    Redo   UPPER GASTROINTESTINAL ENDOSCOPY     Social History[1] Social History   Social History Narrative   2025- dgtr lives with her   Alcohol use-  no   Drug use-no   R handed      Immunization History  Administered Date(s) Administered   Fluad Quad(high Dose 65+) 05/05/2019, 06/09/2020, 04/17/2021, 04/20/2022   Fluad Trivalent(High Dose 65+) 05/17/2023   INFLUENZA, HIGH DOSE SEASONAL PF 06/20/2014, 05/13/2015, 06/08/2016, 05/23/2017, 05/18/2018   Influenza Split 05/23/2012, 06/07/2013   Influenza Whole 08/02/2005, 05/08/2007, 05/09/2008, 08/19/2009, 06/03/2010   PFIZER Comirnaty [place] Top)Covid-19 Tri-Sucrose Vaccine 12/17/2020   Pfizer Covid-19 Vaccine Bivalent Booster 27yrs & up 06/17/2021   Pfizer(Comirnaty )Fall Seasonal Vaccine 12 years and older 10/06/2022   Pneumococcal Conjugate-13 02/27/2007   Pneumococcal Polysaccharide-23 02/27/2007, 12/16/2011, 10/16/2014, 05/26/2017   Td 08/02/2001   Tdap 12/16/2011   Zoster, Live 12/27/2012     Objective: Vital Signs: There were no vitals taken for this visit.   Physical Exam   Musculoskeletal Exam: ***  CDAI Exam: CDAI Score: -- Patient Global: --; Provider Global: -- Swollen: --; Tender: -- Joint Exam 09/05/2024   No joint exam has been  documented for this visit   There is currently no information documented on the homunculus. Go to the Rheumatology activity and complete the homunculus joint exam.  Investigation: No additional findings.  Imaging: No results found.  Recent Labs: Lab Results  Component Value Date   WBC 7.0 11/16/2023   HGB 14.4 11/16/2023   PLT 250.0 11/16/2023   NA 140 11/16/2023   K 3.6 11/16/2023   CL 103 11/16/2023   CO2 27 11/16/2023   GLUCOSE 98 11/16/2023   BUN 25 (H) 11/16/2023   CREATININE 1.78 (H) 11/16/2023   BILITOT 0.5 11/16/2023   ALKPHOS 62 11/16/2023   AST 24 11/16/2023   ALT 13 11/16/2023   PROT 7.8 11/16/2023   ALBUMIN  4.0 11/16/2023   CALCIUM  9.8 11/16/2023   GFRAA 40 (L) 07/07/2020    Speciality Comments: No specialty comments available.  Procedures:  No procedures performed Allergies: Oxycodone -acetaminophen , Percocet [oxycodone -acetaminophen ], Pravastatin  sodium, Hydrocodone , Aspirin , and Lexapro [escitalopram]   Assessment / Plan:     Visit Diagnoses: No diagnosis found.  Orders: No orders of the defined types were placed in this encounter.  No orders of the defined types were placed in this encounter.   Face-to-face time spent with patient was *** minutes. Greater than 50% of time was spent in counseling and coordination of care.  Follow-Up Instructions: No follow-ups on file.   Maya Nash, MD  Note - This record has been created using Animal nutritionist.  Chart creation errors have been sought, but may not always  have been located. Such creation errors do not reflect on  the standard of medical care.    [1]  Social History Tobacco Use   Smoking status: Former    Current packs/day: 0.00    Average packs/day: 0.2 packs/day for 53.1 years (10.6 ttl pk-yrs)    Types: Cigarettes    Start date: 27    Quit date: 09/07/2021    Years since quitting: 2.9   Smokeless tobacco: Never   Tobacco comments:    pt has been an on and off smoker since  age 65  Vaping Use   Vaping status: Never Used  Substance Use Topics   Alcohol use: No    Alcohol/week: 0.0 standard drinks of alcohol   Drug use: No   "

## 2024-08-24 NOTE — Telephone Encounter (Signed)
 Appointment has been scheduled for 1/30 at 3pm.

## 2024-08-29 ENCOUNTER — Other Ambulatory Visit: Payer: Self-pay | Admitting: Gastroenterology

## 2024-08-29 DIAGNOSIS — K219 Gastro-esophageal reflux disease without esophagitis: Secondary | ICD-10-CM

## 2024-08-31 ENCOUNTER — Encounter: Payer: Self-pay | Admitting: Family Medicine

## 2024-08-31 ENCOUNTER — Ambulatory Visit: Admitting: Family Medicine

## 2024-08-31 VITALS — BP 118/70 | HR 59 | Temp 97.7°F | Resp 16 | Ht 66.0 in | Wt 211.8 lb

## 2024-08-31 DIAGNOSIS — I119 Hypertensive heart disease without heart failure: Secondary | ICD-10-CM

## 2024-08-31 DIAGNOSIS — Z7984 Long term (current) use of oral hypoglycemic drugs: Secondary | ICD-10-CM | POA: Diagnosis not present

## 2024-08-31 DIAGNOSIS — Z23 Encounter for immunization: Secondary | ICD-10-CM

## 2024-08-31 DIAGNOSIS — Z01818 Encounter for other preprocedural examination: Secondary | ICD-10-CM

## 2024-08-31 DIAGNOSIS — N183 Chronic kidney disease, stage 3 unspecified: Secondary | ICD-10-CM

## 2024-08-31 DIAGNOSIS — E1122 Type 2 diabetes mellitus with diabetic chronic kidney disease: Secondary | ICD-10-CM

## 2024-08-31 LAB — CBC
HCT: 42.8 % (ref 36.0–46.0)
Hemoglobin: 14.1 g/dL (ref 12.0–15.0)
MCHC: 32.8 g/dL (ref 30.0–36.0)
MCV: 89.1 fl (ref 78.0–100.0)
Platelets: 249 10*3/uL (ref 150.0–400.0)
RBC: 4.8 Mil/uL (ref 3.87–5.11)
RDW: 15.8 % — ABNORMAL HIGH (ref 11.5–15.5)
WBC: 7 10*3/uL (ref 4.0–10.5)

## 2024-08-31 LAB — BASIC METABOLIC PANEL WITH GFR
BUN: 19 mg/dL (ref 6–23)
CO2: 24 meq/L (ref 19–32)
Calcium: 9.8 mg/dL (ref 8.4–10.5)
Chloride: 103 meq/L (ref 96–112)
Creatinine, Ser: 1.69 mg/dL — ABNORMAL HIGH (ref 0.40–1.20)
GFR: 28.33 mL/min — ABNORMAL LOW
Glucose, Bld: 121 mg/dL — ABNORMAL HIGH (ref 70–99)
Potassium: 3.7 meq/L (ref 3.5–5.1)
Sodium: 139 meq/L (ref 135–145)

## 2024-08-31 LAB — HEMOGLOBIN A1C: Hgb A1c MFr Bld: 6.2 % (ref 4.6–6.5)

## 2024-08-31 NOTE — Progress Notes (Unsigned)
 "  Chief Complaint  Patient presents with   Pre-op Exam   [name] is a 79 y.o. female, who is here today for chronic disease management. Last seen on *** She is planning on left total knee arthroplasty. Date of surgery has not been determined. Planning on a spinal anesthesia. *** Discussed the use of AI scribe software for clinical note transcription with the patient, who gave verbal consent to proceed.  History of Present Illness [name] is an 79 year old female who presents for pre-operative evaluation for knee surgery.  She is preparing for knee surgery due to ongoing knee problems. She has a history of knee problems and has decided on surgery after discussing her options with her providers. She has previously undergone surgeries, including cervical neurosurgery, during which she experienced complications such as sepsis and delirium, resulting in an extended hospital stay. However, her most recent surgery was uneventful, and she was discharged the following day.  She is currently managing her knee pain with Tylenol , as she cannot take NSAIDs like ibuprofen  or naproxen due to kidney concerns. Her medication regimen includes hydrochlorothiazide , losartan , metoprolol , rosuvastatin , sertraline , and Gemtesa. She is not taking Farxiga anymore and is currently on Jardiance.  She has arrangements to stay overnight post-surgery and is awaiting details on her physical therapy plan. After previous knee surgeries, she had physical therapy at home initially, followed by sessions at a facility once she was able to drive. She emphasizes the importance of physical therapy in her recovery, noting that it helped her regain mobility and function.  In terms of her social history, she is retired. She is cautious about visitors to avoid getting sick before her surgery.  No chest pain, clamminess, or difficulty breathing with exertion. No changes in bowel habits or urinary issues.  Past Surgical  History:  Procedure Laterality Date   ABDOMINAL HYSTERECTOMY  1978   ANTERIOR CERVICAL DECOMP/DISCECTOMY FUSION N/A 05/29/2018   Procedure: ANTERIOR CERVICAL DECOMPRESSION FUSION - CERVICAL FIVE-CERVICAL SIX - CERVICAL SIX-CERVICAL SEVEN;  Surgeon: Louis Shove, MD;  Location: [facility];  Service: Neurosurgery;  Laterality: N/A;   BACK SURGERY     x 2   BREAST LUMPECTOMY WITH RADIOACTIVE SEED LOCALIZATION Right 04/13/2022   Procedure: RIGHT BREAST SEED LUMPECTOMY;  Surgeon: Vanderbilt Ned, MD;  Location: [facility];  Service: General;  Laterality: Right;   CARPAL TUNNEL RELEASE Left    COLONOSCOPY     COLONOSCOPY W/ BIOPSIES AND POLYPECTOMY     Hx: of   CORONARY STENT INTERVENTION N/A 05/27/2017   Procedure: CORONARY STENT INTERVENTION;  Surgeon: Verlin Lonni BIRCH, MD;  Location: [facility];  Service: Cardiovascular;  Laterality: N/A;   ESOPHAGOGASTRODUODENOSCOPY     HNP     LEFT HEART CATH AND CORONARY ANGIOGRAPHY N/A 05/27/2017   Procedure: LEFT HEART CATH AND CORONARY ANGIOGRAPHY;  Surgeon: Verlin Lonni BIRCH, MD;  Location: [facility];  Service: Cardiovascular;  Laterality: N/A;   LUMBAR LAMINECTOMY/DECOMPRESSION MICRODISCECTOMY Left 02/12/2013   Procedure: LUMBAR TWO THREE, LUMBAR THREE FOUR, LUMBAR FOUR FIVE  LAMINECTOMY/DECOMPRESSION MICRODISCECTOMY 3 LEVELS;  Surgeon: Shove DELENA Louis, MD;  Location: [facility];  Service: Neurosurgery;  Laterality: Left;   NEPHRECTOMY Right 2010   10.rcc cancer   TOTAL KNEE ARTHROPLASTY Right    Redo   UPPER GASTROINTESTINAL ENDOSCOPY      Hypertension:  Medications:*** BP readings at home:*** Side effects:***  Negative for unusual or severe headache, visual changes, exertional chest  pain, dyspnea,  focal weakness, or edema.  Lab Results  Component Value Date   CREATININE 1.78 (H) 11/16/2023   BUN 25 (H) 11/16/2023   NA 140 11/16/2023   K 3.6 11/16/2023   CL 103 11/16/2023   CO2 27 11/16/2023     Diabetes Mellitus II:  - Checking BG at home: *** - Medications: *** - Compliance: *** - Diet: *** - Exercise: *** - eye exam: *** - foot exam: ***  - Negative for symptoms of hypoglycemia, polyuria, polydipsia, numbness extremities, foot ulcers/trauma  Lab Results  Component Value Date   HGBA1C 6.1 03/23/2024   Lab Results  Component Value Date   MICROALBUR 5.8 03/26/2020    Hyperlipidemia: Currently on *** Following a low fat diet: ***. Side effects from medication:*** Lab Results  Component Value Date   CHOL 146 06/22/2024   HDL 61 06/22/2024   LDLCALC 65 06/22/2024   LDLDIRECT 171.5 12/06/2011   TRIG 109 06/22/2024   CHOLHDL 2.4 06/22/2024    Review of Systems See other pertinent positives and negatives in HPI.  Medications Ordered Prior to Encounter[1]  Past Medical History:  Diagnosis Date   Allergy     Angio-edema    Anxiety    C. difficile diarrhea    history of   CAD (coronary artery disease), native coronary artery    a. 05/2017 showed 100% mLCx tx with DES, otherwise 20% mRCA, 50% ramus, 30% dLAD, 20% prox-mid LAD, 20% mLAD treated medically.    Cancer [facility]    Right kidney CA removed.    CKD (chronic kidney disease) stage 3, GFR 30-59 ml/min (HCC) 10/11/2016   s/p R nephrectomy   Colon polyps 2008   HYPERPLASTIC   COPD (chronic obstructive pulmonary disease) (HCC)    former smoker   Depression    Gastritis    GERD (gastroesophageal reflux disease)    History of echocardiogram    Echo 3/18:  Moderate LVH, EF 60-65, grade 1 diastolic dysfunction, calcified aortic valve, mild MR, moderate LAE   History of nuclear stress test    Myoview  3/18: Mod size and intensity fixed septal defect, may be artifact.Opposite mod size and intensity lat defect, which is reversible and could represent ischemia or possibly artifact (SDS 4). LVEF 71% with normal wall motion. Intermediate risk study. >> images reviewed with Dr. Vina Gull - no sig ischemia; med  rx    Hx of cardiovascular stress test    Lexiscan  Myoview  6/16:  EF 70%, no scar or ischemia; Low Risk   Hyperlipidemia    Hypertension    Myocardial infarction (HCC)    2018   Neuropathy    Orthostatic hypotension 05/28/2017   OSA (obstructive sleep apnea) 01/16/2021   Osteoarthritis    Pre-diabetes    no meds   Recurrent upper respiratory infection (URI)    Rheumatoid arthritis (HCC)    RA (Dr. Lenon) Bilateral hands   S/P angioplasty with stent 05/27/17 to LCX with DES  05/28/2017   Sleep apnea 2023   does not use CPAP   Thyroid  disease     Allergies[2]  Social History   Socioeconomic History   Marital status: Divorced    Spouse name: Not on file   Number of children: 3   Years of education: 12   Highest education level: Not on file  Occupational History   Occupation: Retired    Associate Professor: RETIRED  Tobacco Use   Smoking status: Former    Current packs/day: 0.00  Average packs/day: 0.2 packs/day for 53.1 years (10.6 ttl pk-yrs)    Types: Cigarettes    Start date: 3    Quit date: 09/07/2021    Years since quitting: 2.9   Smokeless tobacco: Never   Tobacco comments:    pt has been an on and off smoker since age 59  Vaping Use   Vaping status: Never Used  Substance and Sexual Activity   Alcohol use: No    Alcohol/week: 0.0 standard drinks of alcohol   Drug use: No   Sexual activity: Not Currently    Birth control/protection: Surgical    Comment: hyst  Other Topics Concern   Not on file  Social History Narrative   2025- dgtr lives with her   Alcohol use- no   Drug use-no   R handed    Social Drivers of Health   Tobacco Use: Medium Risk (08/31/2024)   Patient History    Smoking Tobacco Use: Former    Smokeless Tobacco Use: Never    Passive Exposure: Not on Actuary Strain: Low Risk (09/08/2022)   Overall Financial Resource Strain (CARDIA)    Difficulty of Paying Living Expenses: Not hard at all  Food Insecurity: No Food  Insecurity (10/20/2022)   Hunger Vital Sign    Worried About Running Out of Food in the Last Year: Never true    Ran Out of Food in the Last Year: Never true  Transportation Needs: No Transportation Needs (09/08/2022)   PRAPARE - Administrator, Civil Service (Medical): No    Lack of Transportation (Non-Medical): No  Physical Activity: Inactive (09/08/2022)   Exercise Vital Sign    Days of Exercise per Week: 0 days    Minutes of Exercise per Session: 0 min  Stress: Stress Concern Present (09/08/2022)   [employer] of Occupational Health - Occupational Stress Questionnaire    Feeling of Stress : To some extent  Social Connections: Moderately Integrated (09/08/2022)   Social Connection and Isolation Panel    Frequency of Communication with Friends and Family: More than three times a week    Frequency of Social Gatherings with Friends and Family: More than three times a week    Attends Religious Services: More than 4 times per year    Active Member of Clubs or Organizations: Yes    Attends Banker Meetings: More than 4 times per year    Marital Status: Divorced  Depression (PHQ2-9): Low Risk (08/31/2024)   Depression (PHQ2-9)    PHQ-2 Score: 4  Alcohol Screen: Low Risk (09/08/2022)   Alcohol Screen    Last Alcohol Screening Score (AUDIT): 0  Housing: Low Risk (10/20/2022)   Housing    Last Housing Risk Score: 0  Utilities: Not At Risk (09/08/2022)   AHC Utilities    Threatened with loss of utilities: No  Health Literacy: Not on file    Today's Vitals   08/31/24 1506  BP: 118/70  Pulse: (!) 59  Resp: 16  Temp: 97.7 F (36.5 C)  SpO2: 96%  Weight: 211 lb 12.8 oz (96.1 kg)  Height: 5' 6 (1.676 m)    Body mass index is 34.19 kg/m.  Physical Exam Vitals and nursing note reviewed.  Constitutional:      General: She is not in acute distress.    Appearance: She is well-developed.  HENT:     Head: Normocephalic and atraumatic.     Mouth/Throat:      Mouth: Mucous membranes are  moist.     Pharynx: Oropharynx is clear.  Eyes:     Conjunctiva/sclera: Conjunctivae normal.  Cardiovascular:     Rate and Rhythm: Normal rate and regular rhythm.     Pulses:          Dorsalis pedis pulses are 2+ on the right side and 2+ on the left side.     Heart sounds: Murmur (Soft SEM RUSB) heard.  Pulmonary:     Effort: Pulmonary effort is normal. No respiratory distress.     Breath sounds: Normal breath sounds.  Abdominal:     Palpations: Abdomen is soft. There is no mass.     Tenderness: There is no abdominal tenderness.  Lymphadenopathy:     Cervical: No cervical adenopathy.  Skin:    General: Skin is warm.     Findings: No erythema or rash.  Neurological:     General: No focal deficit present.     Mental Status: She is alert and oriented to person, place, and time.     Comments: Cane***  Psychiatric:        Mood and Affect: Mood and affect normal.     ASSESSMENT AND PLAN:  [name] was seen today for pre-op exam.  Diagnoses and all orders for this visit:  Pre-op evaluation  Hypertension with heart disease -     Basic metabolic panel with GFR; Future -     CBC; Future -     CBC -     Basic metabolic panel with GFR  Type 2 diabetes mellitus with stage 3 chronic kidney disease, without long-term current use of insulin , unspecified whether stage 3a or 3b CKD (HCC) -     Hemoglobin A1c; Future -     Hemoglobin A1c  Immunization due    Orders Placed This Encounter  Procedures   Basic metabolic panel with GFR   CBC   Hemoglobin A1c    No problem-specific Assessment & Plan notes found for this encounter.   Return if symptoms worsen or fail to improve, for keep next appointment.   Rose Alcivar, MD [facility]. [place] office.      [1]  Current Outpatient Medications on File Prior to Visit  Medication Sig Dispense Refill   acetaminophen  (TYLENOL ) 500 MG tablet Take 1,000 mg by mouth as needed for moderate  pain or headache.      albuterol  (VENTOLIN  HFA) 108 (90 Base) MCG/ACT inhaler Inhale 2 puffs into the lungs every 6 (six) hours as needed for wheezing or shortness of breath. 8 g 12   Aspirin  81 MG CAPS Take 81 mg by mouth daily.     azelastine  (ASTELIN ) 0.1 % nasal spray Place 2 sprays into both nostrils 2 (two) times daily. Use in each nostril as directed 30 mL 5   empagliflozin (JARDIANCE) 10 MG TABS tablet Take by mouth daily.     famotidine  (PEPCID ) 20 MG tablet TAKE ONE (1) TABLET BY MOUTH TWICE DAILY IN THE MORNING AND AT BEDTIME 180 tablet 1   fluticasone -salmeterol (ADVAIR HFA) 115-21 MCG/ACT inhaler Inhale 2 puffs into the lungs 2 (two) times daily. 1 each 0   hydrochlorothiazide  (HYDRODIURIL ) 25 MG tablet TAKE 1 TABLET BY MOUTH ONCE DAILY 90 tablet 2   ipratropium (ATROVENT ) 0.06 % nasal spray Place 2 sprays into both nostrils 4 (four) times daily. 15 mL 1   ipratropium-albuterol  (DUONEB) 0.5-2.5 (3) MG/3ML SOLN 1 neb every 6 hours if needed for breathing 360 mL 12   losartan  (COZAAR )  100 MG tablet TAKE 1 TABLET BY MOUTH EVERY MORNING 90 tablet 2   metoprolol  succinate (TOPROL -XL) 100 MG 24 hr tablet TAKE 1 TABLET BY MOUTH DAILY AT NOON 90 tablet 2   nitroGLYCERIN  (NITROSTAT ) 0.4 MG SL tablet place 1 tablet under the tongue every 5 minutes as needed for chest pain 25 tablet 0   pantoprazole  (PROTONIX ) 40 MG tablet TAKE 1 TABLET BY MOUTH DAILY *PATIENT MUST BE SEEN FOR FURTHER REFILLS. CALL [phone] TO SCHEDULE* 30 tablet 0   rosuvastatin  (CRESTOR ) 10 MG tablet TAKE 1 TABLET BY MOUTH EVERY NIGHT AT BEDTIME 90 tablet 2   sertraline  (ZOLOFT ) 100 MG tablet Take 100 mg by mouth daily.     Vibegron (GEMTESA) 75 MG TABS Take 1 tablet by mouth daily.     benzonatate  (TESSALON ) 100 MG capsule Take 1 capsule (100 mg total) by mouth 3 (three) times daily as needed for cough. (Patient not taking: Reported on 08/31/2024) 30 capsule 0   cyclobenzaprine  (FLEXERIL ) 10 MG tablet Take 1 tablet (10 mg  total) by mouth 2 (two) times daily as needed for muscle spasms. 20 tablet 0   fluticasone  (FLONASE ) 50 MCG/ACT nasal spray PLACE 2 SPRAYS INTO BOTH NOSTRILS AT BEDTIME AS NEEDED FOR ALLERGIES. (Patient not taking: Reported on 08/31/2024) 48 mL 1   meclizine  (ANTIVERT ) 25 MG tablet Take 1 tablet (25 mg total) by mouth 3 (three) times daily as needed for dizziness. (Patient not taking: Reported on 08/31/2024) 30 tablet 0   No current facility-administered medications on file prior to visit.  [2]  Allergies Allergen Reactions   Oxycodone -Acetaminophen  Hives and Itching    hallucinations   Percocet [Oxycodone -Acetaminophen ] Hives, Itching and Other (See Comments)    hallucinations   Pravastatin  Sodium Other (See Comments)    Muscle aches Muscle aches   Hydrocodone  Other (See Comments)    crazy dreams   Aspirin  Other (See Comments)    GI upset Other reaction(s): Other (See Comments) REACTION: GI upset   Lexapro [Escitalopram] Itching    Pt reports caused itching and breaking out.      "

## 2024-08-31 NOTE — Patient Instructions (Addendum)
 A few things to remember from today's visit:  Pre-op evaluation  Hypertension with heart disease - Plan: Basic metabolic panel with GFR, CBC, CBC, Basic metabolic panel with GFR  Type 2 diabetes mellitus with stage 3 chronic kidney disease, without long-term current use of insulin , unspecified whether stage 3a or 3b CKD (HCC) - Plan: Hemoglobin A1c, Hemoglobin A1c  Immunization due  No multivitamins or over the counter supplements a week before surgery. No Ibuprofen  or Naproxen, just acetaminophen  for pain management before surgery. No aspirin  5 days before surgery. Stop Jardiance 3 days before surgery. Blood pressure medications and cholesterol medication you can take the day before and after surgery when you are allowed to eat. Early ambulation and Miralax  to prevent constipation.  If you need refills for medications you take chronically, please call your pharmacy. Do not use My Chart to request refills or for acute issues that need immediate attention. If you send a my chart message, it may take a few days to be addressed, specially if I am not in the office.  Please be sure medication list is accurate. If a new problem present, please set up appointment sooner than planned today.

## 2024-09-01 ENCOUNTER — Ambulatory Visit: Payer: Self-pay | Admitting: Family Medicine

## 2024-09-01 MED ORDER — ACCU-CHEK AVIVA PLUS W/DEVICE KIT: PACK | 0 refills | Status: DC

## 2024-09-01 NOTE — Assessment & Plan Note (Signed)
 BP adequately controlled. Continue losartan  100 mg daily,hydrochlorothiazide  25 mg daily, and metoprolol  succinate 100 mg daily as well as low-salt diet.

## 2024-09-01 NOTE — Assessment & Plan Note (Signed)
 Problem has been adequately controlled. She is on Jardiance 10 mg daily. Continue annual eye exam, periodic dental and foot care. F/U in 5-6 months.

## 2024-09-03 ENCOUNTER — Ambulatory Visit: Admitting: Psychology

## 2024-09-04 ENCOUNTER — Other Ambulatory Visit: Payer: Self-pay

## 2024-09-04 DIAGNOSIS — E1122 Type 2 diabetes mellitus with diabetic chronic kidney disease: Secondary | ICD-10-CM

## 2024-09-04 MED ORDER — ACCU-CHEK GUIDE W/DEVICE KIT: PACK | 0 refills | Status: AC

## 2024-09-05 ENCOUNTER — Encounter: Admitting: Rheumatology

## 2024-09-05 DIAGNOSIS — G959 Disease of spinal cord, unspecified: Secondary | ICD-10-CM

## 2024-09-05 DIAGNOSIS — M1120 Other chondrocalcinosis, unspecified site: Secondary | ICD-10-CM

## 2024-09-05 DIAGNOSIS — E1122 Type 2 diabetes mellitus with diabetic chronic kidney disease: Secondary | ICD-10-CM

## 2024-09-05 DIAGNOSIS — E1169 Type 2 diabetes mellitus with other specified complication: Secondary | ICD-10-CM

## 2024-09-05 DIAGNOSIS — M2550 Pain in unspecified joint: Secondary | ICD-10-CM

## 2024-09-05 DIAGNOSIS — I700 Atherosclerosis of aorta: Secondary | ICD-10-CM

## 2024-09-05 DIAGNOSIS — F331 Major depressive disorder, recurrent, moderate: Secondary | ICD-10-CM

## 2024-09-05 DIAGNOSIS — F17200 Nicotine dependence, unspecified, uncomplicated: Secondary | ICD-10-CM

## 2024-09-05 DIAGNOSIS — I2510 Atherosclerotic heart disease of native coronary artery without angina pectoris: Secondary | ICD-10-CM

## 2024-09-05 DIAGNOSIS — J449 Chronic obstructive pulmonary disease, unspecified: Secondary | ICD-10-CM

## 2024-09-05 DIAGNOSIS — Z860100 Personal history of colon polyps, unspecified: Secondary | ICD-10-CM

## 2024-09-05 DIAGNOSIS — F418 Other specified anxiety disorders: Secondary | ICD-10-CM

## 2024-09-05 DIAGNOSIS — D3502 Benign neoplasm of left adrenal gland: Secondary | ICD-10-CM

## 2024-09-05 DIAGNOSIS — G5602 Carpal tunnel syndrome, left upper limb: Secondary | ICD-10-CM

## 2024-09-05 DIAGNOSIS — G629 Polyneuropathy, unspecified: Secondary | ICD-10-CM

## 2024-09-05 DIAGNOSIS — N184 Chronic kidney disease, stage 4 (severe): Secondary | ICD-10-CM

## 2024-09-05 DIAGNOSIS — I739 Peripheral vascular disease, unspecified: Secondary | ICD-10-CM

## 2024-09-05 DIAGNOSIS — K2100 Gastro-esophageal reflux disease with esophagitis, without bleeding: Secondary | ICD-10-CM

## 2024-09-05 DIAGNOSIS — Z905 Acquired absence of kidney: Secondary | ICD-10-CM

## 2024-09-05 DIAGNOSIS — E538 Deficiency of other specified B group vitamins: Secondary | ICD-10-CM

## 2024-09-05 DIAGNOSIS — G4733 Obstructive sleep apnea (adult) (pediatric): Secondary | ICD-10-CM

## 2024-09-05 DIAGNOSIS — K222 Esophageal obstruction: Secondary | ICD-10-CM

## 2024-09-05 DIAGNOSIS — R7689 Other specified abnormal immunological findings in serum: Secondary | ICD-10-CM

## 2024-09-20 ENCOUNTER — Ambulatory Visit: Admitting: Psychology

## 2024-10-02 ENCOUNTER — Ambulatory Visit: Admitting: Family

## 2024-10-02 ENCOUNTER — Ambulatory Visit: Admitting: Rheumatology

## 2024-11-26 ENCOUNTER — Ambulatory Visit: Admitting: Rheumatology

## 2024-12-27 ENCOUNTER — Ambulatory Visit: Admitting: Rheumatology
